# Patient Record
Sex: Female | Born: 1955
Health system: Southern US, Community
[De-identification: ages and names within clinical notes are randomized; demographics above are authoritative.]

## PROBLEM LIST (undated history)

## (undated) DIAGNOSIS — K5792 Diverticulitis of intestine, part unspecified, without perforation or abscess without bleeding: Secondary | ICD-10-CM

## (undated) DIAGNOSIS — T7840XA Allergy, unspecified, initial encounter: Secondary | ICD-10-CM

## (undated) DIAGNOSIS — I429 Cardiomyopathy, unspecified: Secondary | ICD-10-CM

## (undated) DIAGNOSIS — R0600 Dyspnea, unspecified: Secondary | ICD-10-CM

## (undated) DIAGNOSIS — I509 Heart failure, unspecified: Secondary | ICD-10-CM

## (undated) DIAGNOSIS — F329 Major depressive disorder, single episode, unspecified: Secondary | ICD-10-CM

## (undated) DIAGNOSIS — Z5189 Encounter for other specified aftercare: Secondary | ICD-10-CM

## (undated) DIAGNOSIS — F32A Depression, unspecified: Secondary | ICD-10-CM

## (undated) DIAGNOSIS — D649 Anemia, unspecified: Secondary | ICD-10-CM

## (undated) DIAGNOSIS — E119 Type 2 diabetes mellitus without complications: Secondary | ICD-10-CM

## (undated) DIAGNOSIS — F419 Anxiety disorder, unspecified: Secondary | ICD-10-CM

## (undated) DIAGNOSIS — I10 Essential (primary) hypertension: Secondary | ICD-10-CM

## (undated) DIAGNOSIS — E669 Obesity, unspecified: Secondary | ICD-10-CM

## (undated) DIAGNOSIS — N184 Chronic kidney disease, stage 4 (severe): Secondary | ICD-10-CM

## (undated) HISTORY — DX: Anemia, unspecified: D64.9

## (undated) HISTORY — DX: Allergy, unspecified, initial encounter: T78.40XA

## (undated) HISTORY — DX: Major depressive disorder, single episode, unspecified: F32.9

## (undated) HISTORY — DX: Anxiety disorder, unspecified: F41.9

## (undated) HISTORY — PX: REDUCTION MAMMAPLASTY: SUR839

## (undated) HISTORY — PX: LIPOMA EXCISION: SHX5283

## (undated) HISTORY — PX: ABDOMINAL HYSTERECTOMY: SHX81

## (undated) HISTORY — DX: Encounter for other specified aftercare: Z51.89

## (undated) HISTORY — DX: Type 2 diabetes mellitus without complications: E11.9

## (undated) HISTORY — DX: Depression, unspecified: F32.A

## (undated) HISTORY — DX: Cardiomyopathy, unspecified: I42.9

## (undated) HISTORY — DX: Heart failure, unspecified: I50.9

## (undated) HISTORY — DX: Essential (primary) hypertension: I10

## (undated) HISTORY — DX: Obesity, unspecified: E66.9

## (undated) HISTORY — DX: Chronic kidney disease, stage 4 (severe): N18.4

## (undated) HISTORY — PX: BREAST SURGERY: SHX581

---

## 1998-03-25 ENCOUNTER — Emergency Department (HOSPITAL_COMMUNITY): Admission: EM | Admit: 1998-03-25 | Discharge: 1998-03-25 | Payer: Self-pay | Admitting: Emergency Medicine

## 2000-09-14 ENCOUNTER — Other Ambulatory Visit: Admission: RE | Admit: 2000-09-14 | Discharge: 2000-09-14 | Payer: Self-pay | Admitting: Family Medicine

## 2001-03-24 ENCOUNTER — Encounter: Payer: Self-pay | Admitting: Family Medicine

## 2001-03-24 ENCOUNTER — Ambulatory Visit (HOSPITAL_COMMUNITY): Admission: RE | Admit: 2001-03-24 | Discharge: 2001-03-24 | Payer: Self-pay | Admitting: Family Medicine

## 2001-03-27 ENCOUNTER — Encounter: Payer: Self-pay | Admitting: Emergency Medicine

## 2001-03-27 ENCOUNTER — Emergency Department (HOSPITAL_COMMUNITY): Admission: EM | Admit: 2001-03-27 | Discharge: 2001-03-27 | Payer: Self-pay | Admitting: Emergency Medicine

## 2001-11-21 ENCOUNTER — Other Ambulatory Visit: Admission: RE | Admit: 2001-11-21 | Discharge: 2001-11-21 | Payer: Self-pay | Admitting: Obstetrics and Gynecology

## 2002-03-14 ENCOUNTER — Emergency Department (HOSPITAL_COMMUNITY): Admission: EM | Admit: 2002-03-14 | Discharge: 2002-03-14 | Payer: Self-pay | Admitting: Emergency Medicine

## 2002-05-16 ENCOUNTER — Emergency Department (HOSPITAL_COMMUNITY): Admission: EM | Admit: 2002-05-16 | Discharge: 2002-05-16 | Payer: Self-pay | Admitting: Emergency Medicine

## 2003-09-13 ENCOUNTER — Emergency Department (HOSPITAL_COMMUNITY): Admission: EM | Admit: 2003-09-13 | Discharge: 2003-09-13 | Payer: Self-pay | Admitting: Emergency Medicine

## 2003-11-20 ENCOUNTER — Emergency Department (HOSPITAL_COMMUNITY): Admission: EM | Admit: 2003-11-20 | Discharge: 2003-11-21 | Payer: Self-pay | Admitting: Emergency Medicine

## 2004-01-29 ENCOUNTER — Other Ambulatory Visit: Admission: RE | Admit: 2004-01-29 | Discharge: 2004-01-29 | Payer: Self-pay | Admitting: Obstetrics and Gynecology

## 2004-03-12 ENCOUNTER — Encounter: Admission: RE | Admit: 2004-03-12 | Discharge: 2004-03-12 | Payer: Self-pay | Admitting: Obstetrics and Gynecology

## 2004-04-14 ENCOUNTER — Emergency Department (HOSPITAL_COMMUNITY): Admission: EM | Admit: 2004-04-14 | Discharge: 2004-04-14 | Payer: Self-pay | Admitting: Emergency Medicine

## 2005-09-10 ENCOUNTER — Emergency Department (HOSPITAL_COMMUNITY): Admission: EM | Admit: 2005-09-10 | Discharge: 2005-09-10 | Payer: Self-pay | Admitting: Emergency Medicine

## 2005-11-03 ENCOUNTER — Ambulatory Visit (HOSPITAL_COMMUNITY): Admission: RE | Admit: 2005-11-03 | Discharge: 2005-11-03 | Payer: Self-pay | Admitting: Family Medicine

## 2006-03-10 ENCOUNTER — Emergency Department (HOSPITAL_COMMUNITY): Admission: EM | Admit: 2006-03-10 | Discharge: 2006-03-10 | Payer: Self-pay | Admitting: Emergency Medicine

## 2006-03-14 ENCOUNTER — Emergency Department (HOSPITAL_COMMUNITY): Admission: EM | Admit: 2006-03-14 | Discharge: 2006-03-14 | Payer: Self-pay | Admitting: Emergency Medicine

## 2006-03-28 ENCOUNTER — Emergency Department (HOSPITAL_COMMUNITY): Admission: EM | Admit: 2006-03-28 | Discharge: 2006-03-28 | Payer: Self-pay | Admitting: Ophthalmology

## 2006-07-19 ENCOUNTER — Emergency Department (HOSPITAL_COMMUNITY): Admission: EM | Admit: 2006-07-19 | Discharge: 2006-07-19 | Payer: Self-pay | Admitting: Emergency Medicine

## 2006-10-17 ENCOUNTER — Emergency Department (HOSPITAL_COMMUNITY): Admission: EM | Admit: 2006-10-17 | Discharge: 2006-10-17 | Payer: Self-pay | Admitting: Emergency Medicine

## 2007-05-20 ENCOUNTER — Encounter: Admission: RE | Admit: 2007-05-20 | Discharge: 2007-05-20 | Payer: Self-pay | Admitting: Obstetrics and Gynecology

## 2007-07-20 ENCOUNTER — Emergency Department (HOSPITAL_COMMUNITY): Admission: EM | Admit: 2007-07-20 | Discharge: 2007-07-20 | Payer: Self-pay | Admitting: Emergency Medicine

## 2008-03-28 ENCOUNTER — Observation Stay (HOSPITAL_COMMUNITY): Admission: EM | Admit: 2008-03-28 | Discharge: 2008-03-29 | Payer: Self-pay | Admitting: Emergency Medicine

## 2008-03-28 ENCOUNTER — Ambulatory Visit: Payer: Self-pay | Admitting: Cardiology

## 2008-04-04 ENCOUNTER — Ambulatory Visit: Payer: Self-pay

## 2008-04-30 ENCOUNTER — Ambulatory Visit: Payer: Self-pay | Admitting: Internal Medicine

## 2008-05-04 ENCOUNTER — Ambulatory Visit: Payer: Self-pay | Admitting: Internal Medicine

## 2008-05-04 LAB — CONVERTED CEMR LAB
Hgb A1c MFr Bld: 11.6 % — ABNORMAL HIGH (ref 4.6–6.0)
T3, Free: 3 pg/mL (ref 2.3–4.2)
TSH: 0.32 microintl units/mL — ABNORMAL LOW (ref 0.35–5.50)

## 2008-11-29 ENCOUNTER — Encounter (INDEPENDENT_AMBULATORY_CARE_PROVIDER_SITE_OTHER): Payer: Self-pay | Admitting: *Deleted

## 2009-07-23 ENCOUNTER — Ambulatory Visit (HOSPITAL_COMMUNITY): Admission: RE | Admit: 2009-07-23 | Discharge: 2009-07-23 | Payer: Self-pay | Admitting: Family Medicine

## 2010-01-29 ENCOUNTER — Telehealth: Payer: Self-pay | Admitting: Internal Medicine

## 2010-11-02 ENCOUNTER — Encounter: Payer: Self-pay | Admitting: Obstetrics and Gynecology

## 2010-11-02 ENCOUNTER — Encounter: Payer: Self-pay | Admitting: Geriatric Medicine

## 2010-11-13 NOTE — Progress Notes (Signed)
Summary: Schedule Colonoscopy  Phone Note Outgoing Call Call back at Home Phone [phone]   Call placed by: Bernita Buffy CMA Deborra Medina),  January 29, 2010 4:48 PM Call placed to: Patient Summary of Call: All numbers listed for the patient are disconnected. She is due for a recall colonoscopy, we will mail her another letter Initial call taken by: Bernita Buffy CMA Deborra Medina),  January 29, 2010 4:49 PM
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
## 2011-02-24 NOTE — Discharge Summary (Signed)
[name], [name]                  ACCOUNT NO.:  [id number]   MEDICAL RECORD NO.:  [id number]          PATIENT TYPE:  OBV   LOCATION:  3701                         FACILITY:  [facility]   PHYSICIAN:  Fay Records, MD, FACCDATE OF BIRTH:  11-25-1955   DATE OF ADMISSION:  03/28/2008  DATE OF DISCHARGE:  03/29/2008                               DISCHARGE SUMMARY   PRIMARY CARDIOLOGIST:  Vanna Scotland. Olevia Perches, MD, [facility]   PRIMARY CARE PHYSICIAN:  Dr. Reinaldo Meeker at [facility] in  [place].   DISCHARGE DIAGNOSIS:  Chest pain without EKG changes with negative  cardiac enzymes this admission.  The patient being discharged home to  have outpatient stress Myoview, which is scheduled for April 04, 2008, at  8 a.m.   PAST MEDICAL HISTORY:  1. Diabetes.  The patient's hemoglobin A1c during this admission is      13.6.  2. Dyslipidemia.  Lipid panel drawn on this admission, total      cholesterol 206, triglycerides 465, and HDL 37.  3. Status post C-section as well as breast reduction, and      hysterectomy.   ALLERGIES:  SULFA.   HOSPITAL COURSE:  Patricia [name] is a 55 year old female admitted for episode  of chest discomfort, felt to be atypical, however, multiple risk  factors.  The patient with known history of diabetes, poorly controlled.  States she has been under increased stress, complaining of increased  anxiety, evaluated by Dr. Jabier Mutton.  The patient being discharged home after  ruled out for MI to follow up with primary care physician regarding her  diabetes and follow up in the office for stress Myoview, which is  scheduled for April 04, 2008, and then a followup with Dr. Harrington Challenger on April 30, 2008, at 4 p.m.  The patient to continue her home medications with  the addition of aspirin 81 mg p.o. daily.   HOME MEDICATIONS:  1. Metformin 1000 mg b.i.d.  2. Insulin 70/30, 55 units in the a.m., 35 units at lunch, and 45      units in the evening.  3. Lopid 1 tablet daily.  4. Accupril  5 mg daily.   DIET:  The patient is also instructed to follow heart-healthy, diabetic  diet.   DURATION OF DISCHARGE/ENCOUNTER:  Less than 30 minutes.      Rosanne Sack, ACNP      Fay Records, MD, [facility]  Electronically Signed    MB/MEDQ  D:  03/29/2008  T:  03/29/2008  Job:  [id number]   cc:   Dr. Reinaldo Meeker
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
## 2011-02-27 NOTE — H&P (Signed)
Patricia [name], Patricia NO.:  [id number]   MEDICAL RECORD NO.:  [id number]          Patricia [name] TYPE:  EMS   LOCATION:  MAJO                         FACILITY:  [facility]   PHYSICIAN:  Lizabeth Leyden, MD DATE OF BIRTH:  [birth year]   DATE OF ADMISSION:  03/28/2006  DATE OF DISCHARGE:                                HISTORY & PHYSICAL   Cardiology History and Physical on behalf of Dr. Haroldine Laws   CHIEF COMPLAINT:  Chest pain.   HISTORY OF PRESENT ILLNESS:  He is a 55 year old gentleman with a history of  diabetes times 10 years and alcohol abuse who presents to the emergency room  complaining of chest pain 10/10, occurring after eating a sandwich.  No  radiation.  No shortness of breath.  No syncope or ordinary syncope.  The  pain has been refractory to sublingual nitroglycerin, nitroglycerin drip and  was only appreciable to tolerate and was only improved with morphine.   PAST MEDICAL HISTORY:  1.  Diabetes for 5-10 years.  2.  Former alcohol abuser.  3.  Formerly incarcerated.   SOCIAL HISTORY:  [place]ives locally.  Greater than 40-pack year smoking history.  Former alcohol abuser.  Denies drugs.  Mother and father both died of  complications of coronary artery disease in their 62's.   ALLERGIES:  No known drug allergies.   MEDICATIONS:  1.  Glipizide.  2.  Fentanyl patch.  3.  Zantac.  4.  Cortisone.   REVIEW OF SYSTEMS:  Otherwise, negative besides what is stated in HPI.   PHYSICAL EXAMINATION:  VITAL SIGNS:  Temperature 98.7, pulse 90,  respirations 18, blood pressure 110/70 in the right arm and in the left arm  108/70, saturations 98% on 2 liters.  GENERAL APPEARANCE:  No apparent distress, sleeping, arouses with pain.  HEENT:  Normocephalic, atraumatic.  Pupils equal, round and reactive to  light and accommodation.  Extraocular muscles intact.  NECK:  Supple.  Generally, he has pulsations that are absent with the head  at 30 degrees.  Lymphadenopathy is  unremarkable.  CARDIOVASCULAR:  Regular S1, S2 with no murmurs, rubs or gallops.  A lot of  deviated PMI.  Pulses are 2+ and equal throughout.  LUNGS:  Clear to auscultation with no wheezes, rales or rhonchi.  SKIN:  Clear.  ABDOMEN:  Soft, nontender with positive bowel sounds.  RECTAL:  Heme negative stools.  EXTREMITIES:  No cyanosis, clubbing or edema.  No CVA tenderness.  NEUROLOGICAL:  Alert and oriented x3.  Cranial nerves 2-12 are intact.  Strength is 5/5 in all extremities.  Normal sensation throughout.  Normal  cerebellar function.   STUDIES:  EKG shows a heart rate of 107, sinus rhythm, normal axis.  PR  intervals 150, QRS duration 79, QTC 439.  Labs are currently pending.   ASSESSMENT/PLAN:  This is a 55 year old man with chest pain that is  atypical, and he describes in intensity which is as worse as apparent  clinical exam findings.  Other possibilities for his chest pain in addition  to cardiac syndrome includes GERD and  possibly dissection.  We will get a CT  scan to address the possibility of dissection, although, clinical suspicion  is low.  We will start the Patricia [name] on GI cocktail.  We will rule out MI with  enzymes and continued telemetry.  Will check a lipid panel, check a  hemoglobin A1C, start ACE inhibitor and beta blocker and consider a  functional study if the above is negative.      Lizabeth Leyden, MD     DBH/MEDQ  D:  03/28/2006  T:  03/29/2006  Job:  [id number]

## 2011-07-09 LAB — POCT I-STAT, CHEM 8
BUN: 11
Calcium, Ion: 1.17
Chloride: 101
Creatinine, Ser: 0.5
Glucose, Bld: 316 — ABNORMAL HIGH
HCT: 41
Hemoglobin: 13.9
Potassium: 4
Sodium: 136
TCO2: 26

## 2011-07-09 LAB — PROTIME-INR
INR: 0.9
Prothrombin Time: 11.9

## 2011-07-09 LAB — TSH: TSH: 0.303 — ABNORMAL LOW

## 2011-07-09 LAB — LIPID PANEL
Cholesterol: 206 — ABNORMAL HIGH
HDL: 37 — ABNORMAL LOW
LDL Cholesterol: UNDETERMINED
Total CHOL/HDL Ratio: 5.6
Triglycerides: 465 — ABNORMAL HIGH
VLDL: UNDETERMINED

## 2011-07-09 LAB — COMPREHENSIVE METABOLIC PANEL
ALT: 19
AST: 24
Albumin: 3 — ABNORMAL LOW
Alkaline Phosphatase: 87
BUN: 10
CO2: 26
Calcium: 8.8
Chloride: 103
Creatinine, Ser: 0.57
GFR calc Af Amer: >60
GFR calc non Af Amer: >60
Glucose, Bld: 325 — ABNORMAL HIGH
Potassium: 4
Sodium: 136
Total Bilirubin: 0.8
Total Protein: 6.1

## 2011-07-09 LAB — DIFFERENTIAL
Basophils Absolute: 0.1
Lymphocytes Relative: 46
Monocytes Relative: 4
Neutrophils Relative %: 48

## 2011-07-09 LAB — CBC
HCT: 38.5
Platelets: 204
RBC: 4.35
WBC: 7.8

## 2011-07-09 LAB — CARDIAC PANEL(CRET KIN+CKTOT+MB+TROPI)
CK, MB: 0.7
Relative Index: INVALID
Total CK: 82
Troponin I: 0.02
Troponin I: <0.01

## 2011-07-09 LAB — APTT: aPTT: 25

## 2011-07-23 LAB — CBC
MCHC: 33.8
Platelets: 212
RDW: 13.2

## 2011-07-23 LAB — DIFFERENTIAL
Eosinophils Absolute: 0
Eosinophils Relative: 0
Lymphocytes Relative: 35
Monocytes Absolute: 0.4
Monocytes Relative: 5

## 2011-07-23 LAB — I-STAT 8, (EC8 V) (CONVERTED LAB)
BUN: 6
Chloride: 102
HCT: 42
Operator id: 294521
Potassium: 4
Sodium: 133 — ABNORMAL LOW
pH, Ven: 7.541 — ABNORMAL HIGH

## 2011-07-23 LAB — URINE CULTURE

## 2011-07-23 LAB — URINALYSIS, ROUTINE W REFLEX MICROSCOPIC
Glucose, UA: >1000 — AB
Hgb urine dipstick: NEGATIVE
Ketones, ur: NEGATIVE
Nitrite: NEGATIVE
Protein, ur: NEGATIVE
Specific Gravity, Urine: 1.031 — ABNORMAL HIGH
pH: 6

## 2011-07-23 LAB — POCT I-STAT CREATININE: Operator id: 294521

## 2011-07-23 LAB — URINE MICROSCOPIC-ADD ON

## 2013-02-08 ENCOUNTER — Encounter (HOSPITAL_COMMUNITY): Payer: Self-pay

## 2013-02-08 ENCOUNTER — Emergency Department (INDEPENDENT_AMBULATORY_CARE_PROVIDER_SITE_OTHER)
Admission: EM | Admit: 2013-02-08 | Discharge: 2013-02-08 | Disposition: A | Discharge status: Home / Self Care | Payer: No Typology Code available for payment source | Source: Home / Self Care

## 2013-02-08 DIAGNOSIS — E119 Type 2 diabetes mellitus without complications: Secondary | ICD-10-CM

## 2013-02-08 LAB — BASIC METABOLIC PANEL
BUN: 14 mg/dL (ref 6–23)
Calcium: 9.4 mg/dL (ref 8.4–10.5)
Creatinine, Ser: 0.75 mg/dL (ref 0.50–1.10)
GFR calc non Af Amer: >90 mL/min (ref >90)
Glucose, Bld: 128 mg/dL — ABNORMAL HIGH (ref 70–99)
Sodium: 141 mEq/L (ref 135–145)

## 2013-02-08 MED ORDER — LISINOPRIL-HYDROCHLOROTHIAZIDE 10-12.5 MG PO TABS: 1.0000 | ORAL_TABLET | Freq: Every day | ORAL | Status: DC

## 2013-02-08 MED ORDER — GLIPIZIDE 10 MG PO TABS: 10.0000 mg | ORAL_TABLET | Freq: Two times a day (BID) | ORAL | Status: DC

## 2013-02-08 MED ORDER — INSULIN GLARGINE 100 UNIT/ML SOLOSTAR PEN: 45.0000 [IU] | PEN_INJECTOR | Freq: Two times a day (BID) | SUBCUTANEOUS | Status: DC

## 2013-02-08 MED ORDER — METFORMIN HCL 1000 MG PO TABS: 1000.0000 mg | ORAL_TABLET | Freq: Two times a day (BID) | ORAL | Status: DC

## 2013-02-08 NOTE — ED Notes (Signed)
Patient has a history of DM Needs medication refill

## 2013-02-24 ENCOUNTER — Telehealth: Payer: Self-pay | Admitting: General Practice

## 2013-02-24 NOTE — Telephone Encounter (Signed)
Patient called to get bloodwork results

## 2013-02-27 ENCOUNTER — Telehealth: Payer: Self-pay | Admitting: General Practice

## 2013-02-27 NOTE — Telephone Encounter (Signed)
Pt wants lab results for 4/30 visit. Please call back.

## 2013-02-28 ENCOUNTER — Telehealth: Payer: Self-pay | Admitting: *Deleted

## 2013-02-28 NOTE — Telephone Encounter (Signed)
02/28/13 Patient made aware of lab results drawn ob 02/08/13

## 2013-03-10 ENCOUNTER — Ambulatory Visit: Payer: No Typology Code available for payment source | Attending: Family Medicine | Admitting: Internal Medicine

## 2013-03-10 VITALS — BP 164/94 | HR 97 | Temp 98.4°F | Resp 18 | Ht 63.0 in | Wt 188.0 lb

## 2013-03-10 DIAGNOSIS — Z0000 Encounter for general adult medical examination without abnormal findings: Secondary | ICD-10-CM | POA: Insufficient documentation

## 2013-03-10 DIAGNOSIS — I10 Essential (primary) hypertension: Secondary | ICD-10-CM | POA: Insufficient documentation

## 2013-03-10 DIAGNOSIS — Z79899 Other long term (current) drug therapy: Secondary | ICD-10-CM | POA: Insufficient documentation

## 2013-03-10 DIAGNOSIS — Z794 Long term (current) use of insulin: Secondary | ICD-10-CM | POA: Insufficient documentation

## 2013-03-10 DIAGNOSIS — E119 Type 2 diabetes mellitus without complications: Secondary | ICD-10-CM | POA: Insufficient documentation

## 2013-03-10 MED ORDER — LISINOPRIL-HYDROCHLOROTHIAZIDE 10-12.5 MG PO TABS: 2.0000 | ORAL_TABLET | Freq: Every day | ORAL | Status: DC

## 2013-03-10 NOTE — Progress Notes (Signed)
Patient ID: AMAHIA KUMAGAI, female   DOB: 1956/06/28, 57 y.o.   MRN: [id number] Patient Demographics  Patricia [name], is a 57 y.o. female  [id number]  [id number]  DOB - [birth year]  Chief Complaint  Patient presents with  . Follow-up    Patient presents to review medicaiton and labs.        Subjective:   Patricia [name] today is here to establish primary care.Patient has a longstanding hx of HTN and DM. She does not have any complaints at present. Patient has a History reviewed. No pertinent past medical history.. Currently patient has no complaints. Patient has also has No headache, No chest pain, No abdominal pain,No Nausea, No new weakness tingling or numbness, No Cough or SOB  Objective:    Filed Vitals:   03/10/13 0908  BP: 164/94  Pulse: 97  Temp: 98.4 F (36.9 C)  TempSrc: Oral  Resp: 18  Height: 5\' 3"  (1.6 m)  Weight: 188 lb (85.276 kg)  SpO2: 98%     ALLERGIES:   Allergies  Allergen Reactions  . Sulfa Antibiotics     PAST MEDICAL HISTORY: Hypertension Diabetes  PAST SURGICAL HISTORY: Hysterectomy Cesarean section Mammoplasty  FAMILY HISTORY: No family history of CAD  MEDICATIONS AT HOME: Prior to Admission medications   Medication Sig Start Date End Date Taking? Authorizing Provider  glipiZIDE (GLUCOTROL) 10 MG tablet Take 1 tablet (10 mg total) by mouth 2 (two) times daily before a meal. 02/08/13  Yes Acquanetta Chain, DO  Insulin Glargine (LANTUS SOLOSTAR) 100 UNIT/ML SOPN Inject 45-50 Units into the skin 2 (two) times daily. 02/08/13  Yes Acquanetta Chain, DO  lisinopril-hydrochlorothiazide (PRINZIDE,ZESTORETIC) 10-12.5 MG per tablet Take 2 tablets by mouth daily. 03/10/13  Yes Shanker Kristeen Mans, MD  metFORMIN (GLUCOPHAGE) 1000 MG tablet Take 1 tablet (1,000 mg total) by mouth 2 (two) times daily. 02/08/13  Yes Acquanetta Chain, DO    SOCIAL HISTORY:   has no tobacco, alcohol, and drug history on file.  REVIEW OF SYSTEMS:   Constitutional:   No   Fevers, chills, fatigue.  HEENT:    No headaches, Sore throat,   Cardio-vascular: No chest pain,  Orthopnea, swelling in lower extremities, anasarca, palpitations  GI:  No abdominal pain, nausea, vomiting, diarrhea  Resp: No shortness of breath,  No coughing up of blood.No cough.No wheezing.  Skin:  no rash or lesions.  GU:  no dysuria, change in color of urine, no urgency or frequency.  No flank pain.  Musculoskeletal: No joint pain or swelling.  No decreased range of motion.  No back pain.  Psych: No change in mood or affect. No depression or anxiety.  No memory loss.   Exam  General appearance :Awake, alert, not in any distress. Speech Clear. Not toxic Looking HEENT: Atraumatic and Normocephalic, pupils equally reactive to light and accomodation Neck: supple, no JVD. No cervical lymphadenopathy.  Chest:Good air entry bilaterally, no added sounds  CVS: S1 S2 regular, no murmurs.  Abdomen: Bowel sounds present, Non tender and not distended with no gaurding, rigidity or rebound. Extremities: B/L Lower Ext shows no edema, both legs are warm to touch Neurology: Awake alert, and oriented X 3, CN II-XII intact, Non focal Skin:No Rash Wounds:N/A    Data Review   CBC No results found for this basename: WBC, HGB, HCT, PLT, MCV, MCH, MCHC, RDW, NEUTRABS, LYMPHSABS, MONOABS, EOSABS, BASOSABS, BANDABS, BANDSABD,  in the last 168 hours  Chemistries   No results found for  this basename: NA, K, CL, CO2, GLUCOSE, BUN, CREATININE, GFRCGP, CALCIUM, MG, AST, ALT, ALKPHOS, BILITOT,  in the last 168 hours ------------------------------------------------------------------------------------------------------------------ No results found for this basename: HGBA1C,  in the last 72 hours ------------------------------------------------------------------------------------------------------------------ No results found for this basename: CHOL, HDL, LDLCALC, TRIG,  CHOLHDL, LDLDIRECT,  in the last 72 hours ------------------------------------------------------------------------------------------------------------------ No results found for this basename: TSH, T4TOTAL, FREET3, T3FREE, THYROIDAB,  in the last 72 hours ------------------------------------------------------------------------------------------------------------------ No results found for this basename: VITAMINB12, FOLATE, FERRITIN, TIBC, IRON, RETICCTPCT,  in the last 72 hours  Coagulation profile  No results found for this basename: INR, PROTIME,  in the last 168 hours    Assessment & Plan   Hypertension - Uncontrolled, increase lisinopril/HCTZ to 20/25 - Followup in one month  Diabetes - Have asked the patient to make followup of her CBGs and bring those readings with her during the next - Continue with Lantus 50 units twice a day, glipizide 10 mg twice a day, and metformin 1000 mg twice a day  Followup in one month

## 2013-04-10 ENCOUNTER — Ambulatory Visit: Payer: No Typology Code available for payment source

## 2013-04-18 ENCOUNTER — Ambulatory Visit: Payer: No Typology Code available for payment source | Attending: Family Medicine | Admitting: Internal Medicine

## 2013-04-18 VITALS — BP 148/86 | HR 97 | Temp 98.2°F | Resp 16

## 2013-04-18 DIAGNOSIS — IMO0001 Reserved for inherently not codable concepts without codable children: Secondary | ICD-10-CM | POA: Insufficient documentation

## 2013-04-18 DIAGNOSIS — I10 Essential (primary) hypertension: Secondary | ICD-10-CM | POA: Insufficient documentation

## 2013-04-18 DIAGNOSIS — E785 Hyperlipidemia, unspecified: Secondary | ICD-10-CM | POA: Insufficient documentation

## 2013-04-18 DIAGNOSIS — E119 Type 2 diabetes mellitus without complications: Secondary | ICD-10-CM

## 2013-04-18 MED ORDER — INSULIN ASPART PROT & ASPART (70-30 MIX) 100 UNIT/ML ~~LOC~~ SUSP: 35.0000 [IU] | Freq: Two times a day (BID) | SUBCUTANEOUS | Status: DC

## 2013-04-18 MED ORDER — AMLODIPINE BESYLATE 5 MG PO TABS: 5.0000 mg | ORAL_TABLET | Freq: Every day | ORAL | Status: DC

## 2013-04-18 NOTE — Progress Notes (Signed)
Pt here for A1C draw and the need for medication financial assistance. States she recently lost job and unable to afford bp/ insulin meds. Last cbg checked yesterday 236. Pt is taking Metformin 1000mg  tab.vss

## 2013-04-18 NOTE — Patient Instructions (Addendum)
Blood Sugar Monitoring, Adult GLUCOSE METERS FOR SELF-MONITORING OF BLOOD GLUCOSE  It is important to be able to correctly measure your blood sugar (glucose). You can use a blood glucose monitor (a small battery-operated device) to check your glucose level at any time. This allows you and your caregiver to monitor your diabetes and to determine how well your treatment plan is working. The process of monitoring your blood glucose with a glucose meter is called self-monitoring of blood glucose (SMBG). When people with diabetes control their blood sugar, they have better health. To test for glucose with a typical glucose meter, place the disposable strip in the meter. Then place a small sample of blood on the "test strip." The test strip is coated with chemicals that combine with glucose in blood. The meter measures how much glucose is present. The meter displays the glucose level as a number. Several new models can record and store a number of test results. Some models can connect to personal computers to store test results or print them out.  Newer meters are often easier to use than older models. Some meters allow you to get blood from places other than your fingertip. Some new models have automatic timing, error codes, signals, or barcode readers to help with proper adjustment (calibration). Some meters have a large display screen or spoken instructions for people with visual impairments.  INSTRUCTIONS FOR USING GLUCOSE METERS  Wash your hands with soap and warm water, or clean the area with alcohol. Dry your hands completely.  Prick the side of your fingertip with a lancet (a sharp-pointed tool used by hand).  Hold the hand down and gently milk the finger until a small drop of blood appears. Catch the blood with the test strip.  Follow the instructions for inserting the test strip and using the SMBG meter. Most meters require the meter to be turned on and the test strip to be inserted before applying  the blood sample.  Record the test result.  Read the instructions carefully for both the meter and the test strips that go with it. Meter instructions are found in the user manual. Keep this manual to help you solve any problems that may arise. Many meters use "error codes" when there is a problem with the meter, the test strip, or the blood sample on the strip. You will need the manual to understand these error codes and fix the problem.  New devices are available such as laser lancets and meters that can test blood taken from "alternative sites" of the body, other than fingertips. However, you should use standard fingertip testing if your glucose changes rapidly. Also, use standard testing if:  You have eaten, exercised, or taken insulin in the past 2 hours.  You think your glucose is low.  You tend to not feel symptoms of low blood glucose (hypoglycemia).  You are ill or under stress.  Clean the meter as directed by the manufacturer.  Test the meter for accuracy as directed by the manufacturer.  Take your meter with you to your caregiver's office. This way, you can test your glucose in front of your caregiver to make sure you are using the meter correctly. Your caregiver can also take a sample of blood to test using a routine lab method. If values on the glucose meter are close to the lab results, you and your caregiver will see that your meter is working well and you are using good technique. Your caregiver will advise you about what  to do if the results do not match. FREQUENCY OF TESTING  Your caregiver will tell you how often you should check your blood glucose. This will depend on your type of diabetes, your current level of diabetes control, and your types of medicines. The following are general guidelines, but your care plan may be different. Record all your readings and the time of day you took them for review with your caregiver.   Diabetes type 1.  When you are using insulin  with good diabetic control (either multiple daily injections or via a pump), you should check your glucose 4 times a day.  If your diabetes is not well controlled, you may need to monitor more frequently, including before meals and 2 hours after meals, at bedtime, and occasionally between 2 a.m. and 3 a.m.  You should always check your glucose before a dose of insulin or before changing the rate on your insulin pump.  Diabetes type 2.  Guidelines for SMBG in diabetes type 2 are not as well defined.  If you are on insulin, follow the guidelines above.  If you are on medicines, but not insulin, and your glucose is not well controlled, you should test at least twice daily.  If you are not on insulin, and your diabetes is controlled with medicines or diet alone, you should test at least once daily, usually before breakfast.  A weekly profile will help your caregiver advise you on your care plan. The week before your visit, check your glucose before a meal and 2 hours after a meal at least daily. You may want to test before and after a different meal each day so you and your caregiver can tell how well controlled your blood sugars are throughout the course of a 24 hour period.  Gestational diabetes (diabetes during pregnancy).  Frequent testing is often necessary. Accurate timing is important.  If you are not on insulin, check your glucose 4 times a day. Check it before breakfast and 1 hour after the start of each meal.  If you are on insulin, check your glucose 6 times a day. Check it before each meal and 1 hour after the first bite of each meal.  General guidelines.  More frequent testing is required at the start of insulin treatment. Your caregiver will instruct you.  Test your glucose any time you suspect you have low blood sugar (hypoglycemia).  You should test more often when you change medicines, when you have unusual stress or illness, or in other unusual circumstances. OTHER  THINGS TO KNOW ABOUT GLUCOSE METERS  Measurement Range. Most glucose meters are able to read glucose levels over a broad range of values from as low as 0 to as high as 600 mg/dL. If you get an extremely high or low reading from your meter, you should first confirm it with another reading. Report very high or very low readings to your caregiver.  Whole Blood Glucose versus Plasma Glucose. Some older home glucose meters measure glucose in your whole blood. In a lab or when using some newer home glucose meters, the glucose is measured in your plasma (one component of blood). The difference can be important. It is important for you and your caregiver to know whether your meter gives its results as "whole blood equivalent" or "plasma equivalent."  Display of High and Low Glucose Values. Part of learning how to operate a meter is understanding what the meter results mean. Know how high and low glucose concentrations are displayed  on your meter.  Factors that Affect Glucose Meter Performance. The accuracy of your test results depends on many factors and varies depending on the brand and type of meter. These factors include:  Low red blood cell count (anemia).  Substances in your blood (such as uric acid, vitamin C, and others).  Environmental factors (temperature, humidity, altitude).  Name-brand versus generic test strips.  Calibration. Make sure your meter is set up properly. It is a good idea to do a calibration test with a control solution recommended by the manufacturer of your meter whenever you begin using a fresh bottle of test strips. This will help verify the accuracy of your meter.  Improperly stored, expired, or defective test strips. Keep your strips in a dry place with the lid on.  Soiled meter.  Inadequate blood sample. NEW TECHNOLOGIES FOR GLUCOSE TESTING Alternative site testing Some glucose meters allow testing blood from alternative sites. These include the:  Upper  arm.  Forearm.  Base of the thumb.  Thigh. Sampling blood from alternative sites may be desirable. However, it may have some limitations. Blood in the fingertips show changes in glucose levels more quickly than blood in other parts of the body. This means that alternative site test results may be different from fingertip test results, not because of the meter's ability to test accurately, but because the actual glucose concentration can be different.  Continuous Glucose Monitoring Devices to measure your blood glucose continuously are available, and others are in development. These methods can be more expensive than self-monitoring with a glucose meter. However, it is uncertain how effective and reliable these devices are. Your caregiver will advise you if this approach makes sense for you. IF BLOOD SUGARS ARE CONTROLLED, PEOPLE WITH DIABETES REMAIN HEALTHIER.  SMBG is an important part of the treatment plan of patients with diabetes mellitus. Below are reasons for using SMBG:   It confirms that your glucose is at a specific, healthy level.  It detects hypoglycemia and severe hyperglycemia.  It allows you and your caregiver to make adjustments in response to changes in lifestyle for individuals requiring medicine.  It determines the need for starting insulin therapy in temporary diabetes that happens during pregnancy (gestational diabetes). Document Released: 10/01/2003 Document Revised: 12/21/2011 Document Reviewed: 01/22/2011 [facility] Patient Information 2014 [place]. Hypertension As your heart beats, it forces blood through your arteries. This force is your blood pressure. If the pressure is too high, it is called hypertension (HTN) or high blood pressure. HTN is dangerous because you may have it and not know it. High blood pressure may mean that your heart has to work harder to pump blood. Your arteries may be narrow or stiff. The extra work puts you at risk for heart disease,  stroke, and other problems.  Blood pressure consists of two numbers, a higher number over a lower, 110/72, for example. It is stated as "110 over 72." The ideal is below 120 for the top number (systolic) and under 80 for the bottom (diastolic). Write down your blood pressure today. You should pay close attention to your blood pressure if you have certain conditions such as:  Heart failure.  Prior heart attack.  Diabetes  Chronic kidney disease.  Prior stroke.  Multiple risk factors for heart disease. To see if you have HTN, your blood pressure should be measured while you are seated with your arm held at the level of the heart. It should be measured at least twice. A one-time elevated blood pressure  reading (especially in the Emergency Department) does not mean that you need treatment. There may be conditions in which the blood pressure is different between your right and left arms. It is important to see your caregiver soon for a recheck. Most people have essential hypertension which means that there is not a specific cause. This type of high blood pressure may be lowered by changing lifestyle factors such as:  Stress.  Smoking.  Lack of exercise.  Excessive weight.  Drug/tobacco/alcohol use.  Eating less salt. Most people do not have symptoms from high blood pressure until it has caused damage to the body. Effective treatment can often prevent, delay or reduce that damage. TREATMENT  When a cause has been identified, treatment for high blood pressure is directed at the cause. There are a large number of medications to treat HTN. These fall into several categories, and your caregiver will help you select the medicines that are best for you. Medications may have side effects. You should review side effects with your caregiver. If your blood pressure stays high after you have made lifestyle changes or started on medicines,   Your medication(s) may need to be changed.  Other  problems may need to be addressed.  Be certain you understand your prescriptions, and know how and when to take your medicine.  Be sure to follow up with your caregiver within the time frame advised (usually within two weeks) to have your blood pressure rechecked and to review your medications.  If you are taking more than one medicine to lower your blood pressure, make sure you know how and at what times they should be taken. Taking two medicines at the same time can result in blood pressure that is too low. SEEK IMMEDIATE MEDICAL CARE IF:  You develop a severe headache, blurred or changing vision, or confusion.  You have unusual weakness or numbness, or a faint feeling.  You have severe chest or abdominal pain, vomiting, or breathing problems. MAKE SURE YOU:   Understand these instructions.  Will watch your condition.  Will get help right away if you are not doing well or get worse. Document Released: 09/28/2005 Document Revised: 12/21/2011 Document Reviewed: 05/18/2008 [facility] Patient Information 2014 [place].

## 2013-04-18 NOTE — Progress Notes (Signed)
Patient ID: Patricia [name], female   DOB: 03-Aug-1956, 57 y.o.   MRN: [id number]  [id number] up for BP medications  HPI: 57 year old female with past medical history of hypertension, diabetes and dyslipidemia who presented to clinic for followup. Patient reported she does not have money to afford medications except for metformin and glipizide but she cannot afford lisinopril or Lantus. She admits to being noncompliant with those last 2 medications. She has no reports of blurry vision or headaches. No chest pain, shortness of breath or lightheadedness or palpitations.  Allergies  Allergen Reactions  . Sulfa Antibiotics    No past medical history on file. Current Outpatient Prescriptions on File Prior to Visit  Medication Sig Dispense Refill  . glipiZIDE (GLUCOTROL) 10 MG tablet Take 1 tablet (10 mg total) by mouth 2 (two) times daily before a meal.  60 tablet  6  . Insulin Glargine (LANTUS SOLOSTAR) 100 UNIT/ML SOPN Inject 45-50 Units into the skin 2 (two) times daily.  5 pen  11  . lisinopril-hydrochlorothiazide (PRINZIDE,ZESTORETIC) 10-12.5 MG per tablet Take 2 tablets by mouth daily.  60 tablet  3  . metFORMIN (GLUCOPHAGE) 1000 MG tablet Take 1 tablet (1,000 mg total) by mouth 2 (two) times daily.  60 tablet  12  . [DISCONTINUED] quinapril (ACCUPRIL) 5 MG tablet Take by mouth at bedtime.       No current facility-administered medications on file prior to visit.   Family medical history significant for HTN, HLD  History   Social History  . Marital Status: Divorced    Spouse Name: N/A    Number of Children: N/A  . Years of Education: N/A   Occupational History  . Not on file.   Social History Main Topics  . Smoking status: Not on file  . Smokeless tobacco: Not on file  . Alcohol Use: Not on file  . Drug Use: Not on file  . Sexually Active: Not on file   Other Topics Concern  . Not on file   Social History Narrative  . No narrative on file    Review of Systems   Constitutional: Negative for fever, chills, diaphoresis, activity change, appetite change and fatigue.  HENT: Negative for ear pain, nosebleeds, congestion, facial swelling, rhinorrhea, neck pain, neck stiffness and ear discharge.   Eyes: Negative for pain, discharge, redness, itching and visual disturbance.  Respiratory: Negative for cough, choking, chest tightness, shortness of breath, wheezing and stridor.   Cardiovascular: Negative for chest pain, palpitations and leg swelling.  Gastrointestinal: Negative for abdominal distention.  Genitourinary: Negative for dysuria, urgency, frequency, hematuria, flank pain, decreased urine volume, difficulty urinating and dyspareunia.  Musculoskeletal: Negative for back pain, joint swelling, arthralgias and gait problem.  Neurological: Negative for dizziness, tremors, seizures, syncope, facial asymmetry, speech difficulty, weakness, light-headedness, numbness and headaches.  Hematological: Negative for adenopathy. Does not bruise/bleed easily.  Psychiatric/Behavioral: Negative for hallucinations, behavioral problems, confusion, dysphoric mood, decreased concentration and agitation.    Objective:   Filed Vitals:   04/18/13 0941  BP: 148/86  Pulse: 97  Temp: 98.2 F (36.8 C)  Resp: 16    Physical Exam  Constitutional: Appears well-developed and well-nourished. No distress.  HENT: Normocephalic. External right and left ear normal. Oropharynx is clear and moist.  Eyes: Conjunctivae and EOM are normal. PERRLA, no scleral icterus.  Neck: Normal ROM. Neck supple. No JVD. No tracheal deviation. No thyromegaly.  CVS: RRR, S1/S2 +, no murmurs, no gallops, no carotid bruit.  Pulmonary: Effort and breath  sounds normal, no stridor, rhonchi, wheezes, rales.  Abdominal: Soft. BS +,  no distension, tenderness, rebound or guarding.  Musculoskeletal: Normal range of motion. No edema and no tenderness.  Lymphadenopathy: No lymphadenopathy noted, cervical,  inguinal. Neuro: Alert. Normal reflexes, muscle tone coordination. No cranial nerve deficit. Skin: Skin is warm and dry. No rash noted. Not diaphoretic. No erythema. No pallor.  Psychiatric: Normal mood and affect. Behavior, judgment, thought content normal.   Lab Results  Component Value Date   WBC 7.8 03/28/2008   HGB 13.9 03/28/2008   HCT 41.0 03/28/2008   MCV 88.4 03/28/2008   PLT 204 03/28/2008   Lab Results  Component Value Date   CREATININE 0.75 02/08/2013   BUN 14 02/08/2013   NA 141 02/08/2013   K 3.4* 02/08/2013   CL 104 02/08/2013   CO2 30 02/08/2013    Lab Results  Component Value Date   HGBA1C 10.8* 02/08/2013   Lipid Panel     Component Value Date/Time   CHOL  Value: 206        ATP III CLASSIFICATION:  <200     mg/dL   Desirable  200-239  mg/dL   Borderline High  >=240    mg/dL   High* 03/28/2008 1547   TRIG 465* 03/28/2008 1547   HDL 37* 03/28/2008 1547   CHOLHDL 5.6 03/28/2008 1547   VLDL UNABLE TO CALCULATE IF TRIGLYCERIDE OVER 400 mg/dL 03/28/2008 1547   LDLCALC  Value: UNABLE TO CALCULATE IF TRIGLYCERIDE OVER 400 mg/dL        Total Cholesterol/HDL:CHD Risk Coronary Heart Disease Risk Table                     Men   Women  1/2 Average Risk   3.4   3.3 03/28/2008 1547       Assessment and plan:   Patient Active Problem List   Diagnosis Date Noted  . Diabetes, uncontrolled - Last A1c 02/08/2013 is 10.8 indicating poor glycemic control - will check A1c today - patient is noncompliant with Lantus because of financial issues  - Patient is on glipizide and metformin which she reports she is compliant with those medications  03/10/2013     Dyslipidemia  - patient instructed to takefish oil; however due to limited financial resources patient may not be able to afford this   . HTN (hypertension) - As mentioned above due to restrictive financial resources patient was not able to fill lisinopril. We have provided a sample Norvasc/Olmesartan from the clinic 5/20mg   - We have  discussed target BP range - I have advised pt to check BP regularly and to call us back if the numbers are higher than 140/90 - discussed the importance of compliance with medical therapy and diet  03/10/2013     Leisa Lenz [place]

## 2013-04-25 ENCOUNTER — Encounter: Payer: Self-pay | Admitting: Obstetrics & Gynecology

## 2013-05-17 ENCOUNTER — Encounter: Payer: Self-pay | Admitting: Obstetrics & Gynecology

## 2013-05-17 ENCOUNTER — Ambulatory Visit (INDEPENDENT_AMBULATORY_CARE_PROVIDER_SITE_OTHER): Payer: No Typology Code available for payment source | Admitting: Obstetrics & Gynecology

## 2013-05-17 VITALS — BP 164/93 | HR 99 | Temp 97.4°F | Resp 20 | Ht 63.25 in | Wt 175.9 lb

## 2013-05-17 DIAGNOSIS — Z01419 Encounter for gynecological examination (general) (routine) without abnormal findings: Secondary | ICD-10-CM

## 2013-05-17 LAB — POCT URINALYSIS DIP (DEVICE)
Ketones, ur: NEGATIVE mg/dL
Protein, ur: >=300 mg/dL — AB
Specific Gravity, Urine: >=1.03 (ref 1.005–1.030)
pH: 5.5 (ref 5.0–8.0)

## 2013-05-17 NOTE — Progress Notes (Signed)
Subjective:    Patricia [name] is a 57 y.o. female who presents for an annual exam. The patient has no complaints today. She has a groin boil that "busted" today in the shower. The patient is not currently sexually active. GYN screening history: last pap: was normal. The patient wears seatbelts: yes. The patient participates in regular exercise: yes. Has the patient ever been transfused or tattooed?: no. The patient reports that there is not domestic violence in her life.   Menstrual History: OB History   Grav Para Term Preterm Abortions TAB SAB Ect Mult Living   2 2 2       2       Menarche age: 73 [place]: 40  No LMP recorded. Patient has had a hysterectomy.    The following portions of the patient's history were reviewed and updated as appropriate: allergies, current medications, past family history, past medical history, past social history, past surgical history and problem list.  Review of Systems A comprehensive review of systems was negative. She is divorced and abstinent of about 2 years. Her last mammogram was about 2 years ago. She had a hysterectomy done for what sounds like a fibroid. She is unemployed, looking for a job.   Objective:    BP 164/93  Pulse 99  Temp(Src) 97.4 F (36.3 C) (Oral)  Resp 20  Ht 5' 3.25" (1.607 m)  Wt 175 lb 14.4 oz (79.788 kg)  BMI 30.9 kg/m2  General Appearance:    Alert, cooperative, no distress, appears stated age  Head:    Normocephalic, without obvious abnormality, atraumatic  Eyes:    PERRL, conjunctiva/corneas clear, EOM's intact, fundi    benign, both eyes  Ears:    Normal TM's and external ear canals, both ears  Nose:   Nares normal, septum midline, mucosa normal, no drainage    or sinus tenderness  Throat:   Lips, mucosa, and tongue normal; teeth and gums normal  Neck:   Supple, symmetrical, trachea midline, no adenopathy;    thyroid:  no enlargement/tenderness/nodules; no carotid   bruit or JVD  Back:     Symmetric, no  curvature, ROM normal, no CVA tenderness  Lungs:     Clear to auscultation bilaterally, respirations unlabored  Chest Wall:    No tenderness or deformity   Heart:    Regular rate and rhythm, S1 and S2 normal, no murmur, rub   or gallop  Breast Exam:    No tenderness, masses, or nipple abnormality  Abdomen:     Soft, non-tender, bowel sounds active all four quadrants,    no masses, no organomegaly  Genitalia:    Normal female without lesion, discharge or tenderness, mild atrophy, no cervix/normal atrophic vaginal cuff, normal bimanual exam (no masses)     Extremities:   Extremities normal, atraumatic, no cyanosis or edema  Pulses:   2+ and symmetric all extremities  Skin:   Skin color, texture, turgor normal, no rashes or lesions  Lymph nodes:   Cervical, supraclavicular, and axillary nodes normal  Neurologic:   CNII-XII intact, normal strength, sensation and reflexes    throughout  .    Assessment:    Healthy female exam.    Plan:     Breast self exam technique reviewed and patient encouraged to perform self-exam monthly. Mammogram.

## 2013-05-17 NOTE — Progress Notes (Signed)
Pt reports having odor with urine and a boil on her groin.

## 2013-05-17 NOTE — Progress Notes (Signed)
Patient ID: DE MINK, female   DOB: 1956/09/12, 58 y.o.   MRN: [id number]

## 2013-05-26 ENCOUNTER — Encounter (HOSPITAL_COMMUNITY): Payer: Self-pay | Admitting: *Deleted

## 2013-05-26 ENCOUNTER — Ambulatory Visit (HOSPITAL_COMMUNITY)
Admission: RE | Admit: 2013-05-26 | Discharge: 2013-05-26 | Disposition: A | Discharge status: Home / Self Care | Payer: No Typology Code available for payment source | Source: Ambulatory Visit | Attending: Obstetrics & Gynecology | Admitting: Obstetrics & Gynecology

## 2013-05-26 ENCOUNTER — Emergency Department (HOSPITAL_COMMUNITY)
Admission: EM | Admit: 2013-05-26 | Discharge: 2013-05-26 | Disposition: A | Discharge status: Home / Self Care | Payer: No Typology Code available for payment source | Attending: Emergency Medicine | Admitting: Emergency Medicine

## 2013-05-26 DIAGNOSIS — Z01419 Encounter for gynecological examination (general) (routine) without abnormal findings: Secondary | ICD-10-CM

## 2013-05-26 DIAGNOSIS — R739 Hyperglycemia, unspecified: Secondary | ICD-10-CM

## 2013-05-26 DIAGNOSIS — Z90710 Acquired absence of both cervix and uterus: Secondary | ICD-10-CM | POA: Insufficient documentation

## 2013-05-26 DIAGNOSIS — Z1231 Encounter for screening mammogram for malignant neoplasm of breast: Secondary | ICD-10-CM | POA: Insufficient documentation

## 2013-05-26 DIAGNOSIS — Z7982 Long term (current) use of aspirin: Secondary | ICD-10-CM | POA: Insufficient documentation

## 2013-05-26 DIAGNOSIS — I10 Essential (primary) hypertension: Secondary | ICD-10-CM | POA: Insufficient documentation

## 2013-05-26 DIAGNOSIS — E1169 Type 2 diabetes mellitus with other specified complication: Secondary | ICD-10-CM | POA: Insufficient documentation

## 2013-05-26 DIAGNOSIS — E669 Obesity, unspecified: Secondary | ICD-10-CM | POA: Insufficient documentation

## 2013-05-26 DIAGNOSIS — N390 Urinary tract infection, site not specified: Secondary | ICD-10-CM

## 2013-05-26 LAB — CBC WITH DIFFERENTIAL/PLATELET
Basophils Absolute: 0 10*3/uL (ref 0.0–0.1)
Basophils Relative: 0 % (ref 0–1)
Eosinophils Relative: 2 % (ref 0–5)
HCT: 31.4 % — ABNORMAL LOW (ref 36.0–46.0)
Hemoglobin: 11.3 g/dL — ABNORMAL LOW (ref 12.0–15.0)
MCHC: 36 g/dL (ref 30.0–36.0)
MCV: 84.2 fL (ref 78.0–100.0)
Monocytes Absolute: 0.4 10*3/uL (ref 0.1–1.0)
Monocytes Relative: 5 % (ref 3–12)
RDW: 13.7 % (ref 11.5–15.5)

## 2013-05-26 LAB — COMPREHENSIVE METABOLIC PANEL
AST: 25 U/L (ref 0–37)
Albumin: 2.9 g/dL — ABNORMAL LOW (ref 3.5–5.2)
BUN: 16 mg/dL (ref 6–23)
CO2: 24 mEq/L (ref 19–32)
Calcium: 9.5 mg/dL (ref 8.4–10.5)
Creatinine, Ser: 0.76 mg/dL (ref 0.50–1.10)
GFR calc non Af Amer: >90 mL/min (ref >90)

## 2013-05-26 LAB — URINALYSIS, ROUTINE W REFLEX MICROSCOPIC
Glucose, UA: >1000 mg/dL — AB
Ketones, ur: NEGATIVE mg/dL
Nitrite: POSITIVE — AB
Protein, ur: 100 mg/dL — AB

## 2013-05-26 LAB — URINE MICROSCOPIC-ADD ON

## 2013-05-26 LAB — LIPASE, BLOOD: Lipase: 58 U/L (ref 11–59)

## 2013-05-26 MED ORDER — CEPHALEXIN 500 MG PO CAPS: 500.0000 mg | ORAL_CAPSULE | Freq: Four times a day (QID) | ORAL | Status: DC

## 2013-05-26 MED ORDER — INSULIN ASPART 100 UNIT/ML ~~LOC~~ SOLN
10.0000 [IU] | Freq: Once | SUBCUTANEOUS | Status: AC
  Administered 2013-05-26: 10 [IU] via SUBCUTANEOUS
  Filled 2013-05-26: qty 1

## 2013-05-26 MED ORDER — DEXTROSE 5 % IV SOLN
1.0000 g | Freq: Once | INTRAVENOUS | Status: AC
  Administered 2013-05-26: 1 g via INTRAVENOUS
  Filled 2013-05-26: qty 10

## 2013-05-26 NOTE — ED Notes (Signed)
Patient with lower back pain and left lower abdominal pain since yesterday.  Patient states that she has been having some frequency in urination.  Patient states that the pain got worse today.

## 2013-05-26 NOTE — ED Provider Notes (Signed)
CSN: [id number]     Arrival date & time 05/26/13  1638 History     First MD Initiated Contact with Patient 05/26/13 1817     Chief Complaint  Patient presents with  . Flank Pain   (Consider location/radiation/quality/duration/timing/severity/associated sxs/prior Treatment) HPI  57 year old female with history of insulin-dependent diabetes, hypertension, and obesity presents complaining of flank pain. Patient reports for the past week she noticed strong odor in her urine. Yesterday she developed a gradual onset of pain to her left flank which radiates to her left groin. Pain is a sharp stabbing sensation worsening with movement. No complaints of fever, chills, headache, chest pain, shortness of breath, abdominal pain, nausea, vomiting, diarrhea, dysuria, hematuria, hematochezia, melena, vaginal discharge, or rash. She is not sexually active. She has had abdominal hysterectomy in the past. She also reports being out of her insulin for 2 months due to inability to afford. She has not been checking her blood sugar. she is currently taking metformin and glipizide. Her last UTI was a year ago.  Past Medical History  Diagnosis Date  . Diabetes mellitus without complication   . Hypertension   . Obesity    Past Surgical History  Procedure Laterality Date  . Cesarean section  1982, 1988  . Abdominal hysterectomy    . Breast surgery      reduction   Family History  Problem Relation Age of Onset  . Diabetes Brother   . Cancer Brother     stomach   History  Substance Use Topics  . Smoking status: Never Smoker   . Smokeless tobacco: Not on file  . Alcohol Use: No   OB History   Grav Para Term Preterm Abortions TAB SAB Ect Mult Living   2 2 2       2      Review of Systems  All other systems reviewed and are negative.    Allergies  Sulfa antibiotics  Home Medications   Current Outpatient Rx  Name  Route  Sig  Dispense  Refill  . amLODipine (NORVASC) 5 MG tablet   Oral  Take 1 tablet (5 mg total) by mouth daily.   90 tablet   3   . aspirin 81 MG tablet   Oral   Take 81 mg by mouth daily.         Marland Kitchen glipiZIDE (GLUCOTROL) 10 MG tablet   Oral   Take 1 tablet (10 mg total) by mouth 2 (two) times daily before a meal.   60 tablet   6   . metFORMIN (GLUCOPHAGE) 1000 MG tablet   Oral   Take 1 tablet (1,000 mg total) by mouth 2 (two) times daily.   60 tablet   12    BP 155/74  Pulse 94  Temp(Src) 97.9 F (36.6 C)  Resp 22  SpO2 99% Physical Exam  Nursing note and vitals reviewed. Constitutional: She appears well-developed and well-nourished. No distress.  Awake, alert, nontoxic appearance  HENT:  Head: Atraumatic.  Eyes: Conjunctivae are normal. Right eye exhibits no discharge. Left eye exhibits no discharge.  Neck: Neck supple.  Cardiovascular: Normal rate and regular rhythm.   Pulmonary/Chest: Effort normal. No respiratory distress. She exhibits no tenderness.  Abdominal: Soft. She exhibits no distension. There is tenderness (Mild tenderness to left suprapubic region on palpation without guarding or rebound tenderness. Normal hip flexion and extension bilaterally.). There is no rebound and no guarding.  Genitourinary:  Mild left CVA, left low back tenderness on percussion.  Musculoskeletal: She exhibits no tenderness.  ROM appears intact, no obvious focal weakness  Neurological:  Mental status and motor strength appears intact  Skin: No rash noted.  Psychiatric: She has a normal mood and affect.    ED Course   Procedures (including critical care time)  Patient complains of strong urine odor, and left flank pain. Labs remarkable for a urinary tract infection. He also has elevated CBG 500 with normal anion gap and no ketones in the urine. She is afebrile, and hemodynamically stable.  8:26 PM Patient receive her Rocephin here. This CBG improves to 244 from 508 after 10 units of insulin were given.  She agrees to f/u with PCP for further  management of her diabetes.  Will also discharge with abx for her UTI.  Urine culture sent.  Return precaution discussed.     Labs Reviewed  CBC WITH DIFFERENTIAL - Abnormal; Notable for the following:    RBC 3.73 (*)    Hemoglobin 11.3 (*)    HCT 31.4 (*)    Neutrophils Relative % 42 (*)    Lymphocytes Relative 51 (*)    All other components within normal limits  COMPREHENSIVE METABOLIC PANEL - Abnormal; Notable for the following:    Sodium 131 (*)    Glucose, Bld 508 (*)    Albumin 2.9 (*)    Alkaline Phosphatase 119 (*)    Total Bilirubin 0.1 (*)    All other components within normal limits  URINALYSIS, ROUTINE W REFLEX MICROSCOPIC - Abnormal; Notable for the following:    APPearance CLOUDY (*)    Glucose, UA >1000 (*)    Protein, ur 100 (*)    Nitrite POSITIVE (*)    Leukocytes, UA SMALL (*)    All other components within normal limits  URINE MICROSCOPIC-ADD ON - Abnormal; Notable for the following:    Squamous Epithelial / LPF FEW (*)    Bacteria, UA MANY (*)    Casts HYALINE CASTS (*)    All other components within normal limits  URINE CULTURE  LIPASE, BLOOD   Mm Digital Screening  05/26/2013   *RADIOLOGY REPORT*  Clinical Data: Screening.  DIGITAL SCREENING BILATERAL MAMMOGRAM WITH CAD  Comparison: Previous exam(s).  FINDINGS:  ACR Breast Density Category a:  The breast tissue is almost entirely fatty.  There are no findings suspicious for malignancy.  Images were processed with CAD.  IMPRESSION: No mammographic evidence of malignancy.  A result letter of this screening mammogram will be mailed directly to the patient.  RECOMMENDATION: Screening mammogram in one year. (Code:SM-B-01Y)  BI-RADS CATEGORY 1:  Negative.   Original Report Authenticated By: Nolon Nations, M.D.   1. UTI (lower urinary tract infection)   2. Hyperglycemia without ketosis     MDM  BP 156/76  Pulse 99  Temp(Src) 98.1 F (36.7 C) (Oral)  Resp 17  SpO2 99%  I have reviewed nursing notes and  vital signs.  I reviewed available ER/hospitalization records thought the EMR   Domenic Moras, [place] 05/26/13 2030

## 2013-05-26 NOTE — ED Notes (Signed)
Lt sided flank pain with llq pain since yesterday and getting worse.  lmp none

## 2013-05-27 NOTE — ED Provider Notes (Signed)
Medical screening examination/treatment/procedure(s) were performed by non-physician practitioner and as supervising physician I was immediately available for consultation/collaboration.  Merryl Hacker, MD 05/27/13 [phone]

## 2013-05-28 LAB — URINE CULTURE: Colony Count: >=100000

## 2013-06-01 NOTE — ED Notes (Signed)
+   Urine Culture- treated with appropriate medication per protocol MD.

## 2013-06-01 NOTE — ED Notes (Deleted)
+   Urine Culture- treated with appropriate medication per protocol MD.

## 2013-06-14 ENCOUNTER — Encounter: Payer: Self-pay | Admitting: *Deleted

## 2013-08-05 ENCOUNTER — Encounter (HOSPITAL_COMMUNITY): Payer: Self-pay | Admitting: Emergency Medicine

## 2013-08-05 ENCOUNTER — Emergency Department (HOSPITAL_COMMUNITY)
Admission: EM | Admit: 2013-08-05 | Discharge: 2013-08-05 | Disposition: A | Discharge status: Home / Self Care | Payer: No Typology Code available for payment source | Attending: Emergency Medicine | Admitting: Emergency Medicine

## 2013-08-05 DIAGNOSIS — E119 Type 2 diabetes mellitus without complications: Secondary | ICD-10-CM | POA: Insufficient documentation

## 2013-08-05 DIAGNOSIS — Z7982 Long term (current) use of aspirin: Secondary | ICD-10-CM | POA: Insufficient documentation

## 2013-08-05 DIAGNOSIS — E669 Obesity, unspecified: Secondary | ICD-10-CM | POA: Insufficient documentation

## 2013-08-05 DIAGNOSIS — R51 Headache: Secondary | ICD-10-CM | POA: Insufficient documentation

## 2013-08-05 DIAGNOSIS — L089 Local infection of the skin and subcutaneous tissue, unspecified: Secondary | ICD-10-CM

## 2013-08-05 DIAGNOSIS — L723 Sebaceous cyst: Secondary | ICD-10-CM | POA: Insufficient documentation

## 2013-08-05 DIAGNOSIS — Z792 Long term (current) use of antibiotics: Secondary | ICD-10-CM | POA: Insufficient documentation

## 2013-08-05 DIAGNOSIS — Z79899 Other long term (current) drug therapy: Secondary | ICD-10-CM | POA: Insufficient documentation

## 2013-08-05 DIAGNOSIS — Z794 Long term (current) use of insulin: Secondary | ICD-10-CM | POA: Insufficient documentation

## 2013-08-05 DIAGNOSIS — I10 Essential (primary) hypertension: Secondary | ICD-10-CM | POA: Insufficient documentation

## 2013-08-05 NOTE — ED Provider Notes (Signed)
CSN: [id number]     Arrival date & time 08/05/13  1200 History  This chart was scribed for non-physician practitioner Alecia Lemming PA-C working with Carmin Muskrat, MD by Ludger Nutting, ED Scribe. This patient was seen in room TR05C/TR05C and the patient's care was started at 1200.    Chief Complaint  Patient presents with  . Abscess    The history is provided by the patient. No language interpreter was used.    HPI Comments: Patricia [name] is a 57 y.o. female who presents to the Emergency Department complaining of 1 week of gradual onset, gradually worsening, constant abscess to the forehead. She has associated HA from the location of the abscess. Pt states she had had the spot for a long time but it only grew in size and pain in the past 1 week. She has applied hot and cold compresses, antibiotic cream, and peroxide without relief. She states she has tried to pop it without relief. She denies fevers, nausea, vomiting.   Past Medical History  Diagnosis Date  . Diabetes mellitus without complication   . Hypertension   . Obesity    Past Surgical History  Procedure Laterality Date  . Cesarean section  1982, 1988  . Abdominal hysterectomy    . Breast surgery      reduction   Family History  Problem Relation Age of Onset  . Diabetes Brother   . Cancer Brother     stomach   History  Substance Use Topics  . Smoking status: Never Smoker   . Smokeless tobacco: Not on file  . Alcohol Use: No   OB History   Grav Para Term Preterm Abortions TAB SAB Ect Mult Living   2 2 2       2      Review of Systems  Constitutional: Negative for fever.  Gastrointestinal: Negative for nausea and vomiting.  Skin: Positive for wound (abscess to the forehead). Negative for color change.       Positive for abscess.  Neurological: Positive for headaches.  Hematological: Negative for adenopathy.    Allergies  Sulfa antibiotics  Home Medications   Current Outpatient Rx  Name  Route  Sig   Dispense  Refill  . amLODipine (NORVASC) 5 MG tablet   Oral   Take 1 tablet (5 mg total) by mouth daily.   90 tablet   3   . aspirin 81 MG tablet   Oral   Take 81 mg by mouth daily.         Marland Kitchen glipiZIDE (GLUCOTROL) 10 MG tablet   Oral   Take 1 tablet (10 mg total) by mouth 2 (two) times daily before a meal.   60 tablet   6   . ibuprofen (ADVIL,MOTRIN) 200 MG tablet   Oral   Take 200 mg by mouth every 6 (six) hours as needed for pain.         Marland Kitchen insulin glargine (LANTUS) 100 UNIT/ML injection   Subcutaneous   Inject 45 Units into the skin at bedtime.         . metFORMIN (GLUCOPHAGE) 1000 MG tablet   Oral   Take 1 tablet (1,000 mg total) by mouth 2 (two) times daily.   60 tablet   12   . cephALEXin (KEFLEX) 500 MG capsule   Oral   Take 1 capsule (500 mg total) by mouth 4 (four) times daily.   28 capsule   0    BP 153/82  Pulse  103  Temp(Src) 98.5 F (36.9 C) (Oral)  Resp 18  Ht 5\' 6"  (1.676 m)  Wt 180 lb (81.647 kg)  BMI 29.07 kg/m2  SpO2 100% Physical Exam  Nursing note and vitals reviewed. Constitutional: She appears well-developed and well-nourished.  HENT:  Head: Normocephalic and atraumatic.  1 cm nodule left forehead without surrounding cellulitis. No drainage.   Eyes: Conjunctivae and EOM are normal.  Neck: Normal range of motion. Neck supple.  Pulmonary/Chest: Effort normal. No respiratory distress.  Abdominal: She exhibits no distension.  Musculoskeletal: Normal range of motion.  Neurological: She is alert.  Skin: Skin is warm and dry.  Psychiatric: She has a normal mood and affect. Her behavior is normal.    ED Course  Procedures   DIAGNOSTIC STUDIES: Oxygen Saturation is 100% on RA, normal by my interpretation.    COORDINATION OF CARE: 12:49 PM Discussed treatment plan with pt at bedside and pt agreed to plan. Discussed I&D versus conservative management with warm compresses/antibiotics. Discussed risks of scarring. Patient agrees to  proceed.  INCISION AND DRAINAGE PROCEDURE NOTE: Patient identification was confirmed and verbal consent was obtained. This procedure was performed by Alecia Lemming PA-C  at 1:39 PM. Site: L forehead Sterile procedures observed Needle size: 27g Anesthetic used (type and amt): 1cc 2% lidocaine with epi Blade size: 11 Drainage: moderate Complexity: Complex Packing not used Site anesthetized, incision made over site, wound drained.   Small 1-2 mm incision made as to minimize scaring. Thick, cheesy drainage consistent with sebaceous cyst expressed.  Pt tolerated procedure well without complications.  Instructions for care discussed verbally and pt provided with additional written instructions for homecare and f/u.  The patient was urged to return to the Emergency Department urgently with worsening pain, swelling, expanding erythema especially if it streaks away from the affected area, fever, or if they have any other concerns.   The patient was urged to return to the Emergency Department or go to their PCP in 48 hours for wound recheck if the area is not significantly improved.  The patient verbalized understanding and stated agreement with this plan.     Labs Review Labs Reviewed - No data to display Imaging Review No results found.  EKG Interpretation   None       MDM   1. Infected sebaceous cyst    Patient with sebaceous cyst drainage performed without complication. There is no cellulitis associated with the cyst. Area is small. No systemic symptoms of illness. Patient is a diabetic, however given the appearance of the wound the do not feel patient requires antibiotics. Appropriate return instructions given.  I personally performed the services described in this documentation, which was scribed in my presence. The recorded information has been reviewed and is accurate.   Carlisle Cater, PA-C 08/05/13 1534

## 2013-08-05 NOTE — ED Notes (Signed)
Pt reports a abscess on her face for the past week. States that she has not been able to get anything out of it at home. Tender with palpation.

## 2013-08-06 NOTE — ED Provider Notes (Signed)
  Medical screening examination/treatment/procedure(s) were performed by non-physician practitioner and as supervising physician I was immediately available for consultation/collaboration.     Carmin Muskrat, MD 08/06/13 [phone]

## 2013-09-14 ENCOUNTER — Ambulatory Visit: Payer: No Typology Code available for payment source | Attending: Internal Medicine | Admitting: Internal Medicine

## 2013-09-14 VITALS — BP 168/98 | HR 105 | Temp 98.9°F | Resp 14 | Ht 65.0 in | Wt 167.4 lb

## 2013-09-14 DIAGNOSIS — F3289 Other specified depressive episodes: Secondary | ICD-10-CM | POA: Insufficient documentation

## 2013-09-14 DIAGNOSIS — F329 Major depressive disorder, single episode, unspecified: Secondary | ICD-10-CM

## 2013-09-14 DIAGNOSIS — E785 Hyperlipidemia, unspecified: Secondary | ICD-10-CM

## 2013-09-14 DIAGNOSIS — E119 Type 2 diabetes mellitus without complications: Secondary | ICD-10-CM

## 2013-09-14 DIAGNOSIS — I10 Essential (primary) hypertension: Secondary | ICD-10-CM

## 2013-09-14 DIAGNOSIS — Z23 Encounter for immunization: Secondary | ICD-10-CM

## 2013-09-14 MED ORDER — ESCITALOPRAM OXALATE 10 MG PO TABS: 10.0000 mg | ORAL_TABLET | Freq: Every day | ORAL | Status: DC

## 2013-09-14 MED ORDER — AMLODIPINE BESYLATE 5 MG PO TABS: 5.0000 mg | ORAL_TABLET | Freq: Every day | ORAL | Status: DC

## 2013-09-14 MED ORDER — GLIPIZIDE 10 MG PO TABS: 10.0000 mg | ORAL_TABLET | Freq: Two times a day (BID) | ORAL | Status: DC

## 2013-09-14 MED ORDER — LISINOPRIL-HYDROCHLOROTHIAZIDE 10-12.5 MG PO TABS: 1.0000 | ORAL_TABLET | Freq: Every day | ORAL | Status: DC

## 2013-09-14 MED ORDER — DAPAGLIFLOZIN PROPANEDIOL 5 MG PO TABS: 5.0000 mg | ORAL_TABLET | Freq: Every day | ORAL | Status: DC

## 2013-09-14 NOTE — Progress Notes (Signed)
Pt is here for f/u for medication refill. Pt complains that she suffers from stress and requests a medication to cope. Needs an HBA1c.

## 2013-09-14 NOTE — Progress Notes (Signed)
Patient ID: Patricia [name], female   DOB: 1956/10/12, 57 y.o.   MRN: [id number] Patient Demographics  Patricia [name], is a 57 y.o. female  [id number]  [id number]  DOB - [birth year]  Chief Complaint  Patient presents with  . Medication Refill        Subjective:   [name] today is here for a follow up visit. Patient is a 57 year old female with history of hypertension, diabetes, dyslipidemia has been out of job since April 2014, feels depression, currently living with her daughter who has a child as well. She feels depressed about her situation. Also patient is not taking her Lantus insulin for last 5 months as she has not been able to afford it as well as Norvasc.  Patient has No headache, No chest pain, No abdominal pain - No Nausea, No new weakness tingling or numbness, No Cough - SOB.   Objective:    Filed Vitals:   09/14/13 1049  BP: 168/98  Pulse: 105  Temp: 98.9 F (37.2 C)  TempSrc: Oral  Resp: 14  Height: 5\' 5"  (1.651 m)  Weight: 167 lb 6.4 oz (75.932 kg)  SpO2: 100%     ALLERGIES:   Allergies  Allergen Reactions  . Sulfa Antibiotics Itching and Rash    PAST MEDICAL HISTORY: Past Medical History  Diagnosis Date  . Diabetes mellitus without complication   . Hypertension   . Obesity     MEDICATIONS AT HOME: Prior to Admission medications   Medication Sig Start Date End Date Taking? Authorizing Provider  glipiZIDE (GLUCOTROL) 10 MG tablet Take 1 tablet (10 mg total) by mouth 2 (two) times daily before a meal. 09/14/13  Yes Ripudeep K Rai, MD  lisinopril-hydrochlorothiazide (PRINZIDE,ZESTORETIC) 10-12.5 MG per tablet Take 1 tablet by mouth daily. 09/14/13  Yes Ripudeep Krystal Eaton, MD  metFORMIN (GLUCOPHAGE) 1000 MG tablet Take 1 tablet (1,000 mg total) by mouth 2 (two) times daily. 02/08/13  Yes Acquanetta Chain, DO  amLODipine (NORVASC) 5 MG tablet Take 1 tablet (5 mg total) by mouth daily. 09/14/13   Ripudeep Krystal Eaton, MD  aspirin 81 MG tablet Take 81 mg by  mouth daily.    Historical Provider, MD  Dapagliflozin Propanediol 5 MG TABS Take 5 mg by mouth daily. 09/14/13   Ripudeep Krystal Eaton, MD  escitalopram (LEXAPRO) 10 MG tablet Take 1 tablet (10 mg total) by mouth daily. 09/14/13   Ripudeep Krystal Eaton, MD  ibuprofen (ADVIL,MOTRIN) 200 MG tablet Take 200 mg by mouth every 6 (six) hours as needed for pain.    Historical Provider, MD     Exam  General appearance :Awake, alert, NAD, Speech Clear.  HEENT: Atraumatic and Normocephalic, PERLA Neck: supple, no JVD. No cervical lymphadenopathy.  Chest: Clear to auscultation bilaterally, no wheezing, rales or rhonchi CVS: S1 S2 regular, no murmurs.  Abdomen: soft, NBS, NT, ND, no gaurding, rigidity or rebound. Extremities: no cyanosis or clubbing, B/L Lower Ext shows no edema Neurology: Awake alert, and oriented X 3, CN II-XII intact, Non focal Skin: No Rash or lesions Wounds:N/A    Data Review   Basic Metabolic Panel: No results found for this basename: NA, K, CL, CO2, GLUCOSE, BUN, CREATININE, CALCIUM, MG, PHOS,  in the last 168 hours Liver Function Tests: No results found for this basename: AST, ALT, ALKPHOS, BILITOT, PROT, ALBUMIN,  in the last 168 hours  CBC: No results found for this basename: WBC, NEUTROABS, HGB, HCT, MCV, PLT,  in the last 168 hours  ------------------------------------------------------------------------------------------------------------------  Recent Labs  09/14/13 1120  HGBA1C 10.0   ------------------------------------------------------------------------------------------------------------------ No results found for this basename: CHOL, HDL, LDLCALC, TRIG, CHOLHDL, LDLDIRECT,  in the last 72 hours ------------------------------------------------------------------------------------------------------------------ No results found for this basename: TSH, T4TOTAL, FREET3, T3FREE, THYROIDAB,  in the last 72  hours ------------------------------------------------------------------------------------------------------------------ No results found for this basename: VITAMINB12, FOLATE, FERRITIN, TIBC, IRON, RETICCTPCT,  in the last 72 hours  Coagulation profile  No results found for this basename: INR, PROTIME,  in the last 168 hours    Assessment & Plan   Active Problems: Patient Active Problem List   Diagnosis Date Noted  . Depression - Started patient on Lexapro 10 mg daily   09/14/2013  . Dyslipidemia - Check lipid panel next visit   04/18/2013  . Diabetes - Uncontrolled, hemoglobin A1c 10.0 today  - Patient has not been taking Lantus for last 5 months as she was not able to afford it  - Continue metformin 1000mg  BID, glipizide 10mg  BID, start on farxega 5 mg daily - Recheck hemoglobin A1c and blood sugars next visit  - Discussed with pharmacy they will give her the [place] today  03/10/2013  . HTN (hypertension) - Continue lisinopril/HCTZ,  - Rerdered Norvasc,she will pick it up from the clinic pharmacy  03/10/2013    Flu shot today - She had a mammogram this year   Follow-up in 3 months     RAI,RIPUDEEP M.D. 09/14/2013, 11:38 AM

## 2013-10-17 ENCOUNTER — Telehealth: Payer: Self-pay

## 2013-10-23 ENCOUNTER — Other Ambulatory Visit: Payer: Self-pay | Admitting: Emergency Medicine

## 2013-10-23 MED ORDER — FREESTYLE SYSTEM KIT: 1.0000 | PACK | Status: DC | PRN

## 2013-12-13 ENCOUNTER — Ambulatory Visit: Payer: No Typology Code available for payment source | Attending: Internal Medicine | Admitting: Internal Medicine

## 2013-12-13 ENCOUNTER — Encounter: Payer: Self-pay | Admitting: Internal Medicine

## 2013-12-13 VITALS — BP 144/87 | HR 70 | Temp 98.8°F | Resp 17 | Wt 160.6 lb

## 2013-12-13 DIAGNOSIS — I10 Essential (primary) hypertension: Secondary | ICD-10-CM | POA: Insufficient documentation

## 2013-12-13 DIAGNOSIS — Z139 Encounter for screening, unspecified: Secondary | ICD-10-CM

## 2013-12-13 DIAGNOSIS — E119 Type 2 diabetes mellitus without complications: Secondary | ICD-10-CM | POA: Insufficient documentation

## 2013-12-13 DIAGNOSIS — N898 Other specified noninflammatory disorders of vagina: Secondary | ICD-10-CM

## 2013-12-13 LAB — POCT URINALYSIS DIPSTICK
Bilirubin, UA: NEGATIVE
Ketones, UA: NEGATIVE
Leukocytes, UA: NEGATIVE
NITRITE UA: NEGATIVE
PH UA: 5.5
RBC UA: NEGATIVE
Spec Grav, UA: 1.015
UROBILINOGEN UA: 0.2

## 2013-12-13 LAB — COMPLETE METABOLIC PANEL WITH GFR
ALT: 10 U/L (ref 0–35)
AST: 12 U/L (ref 0–37)
Albumin: 3.5 g/dL (ref 3.5–5.2)
Alkaline Phosphatase: 76 U/L (ref 39–117)
BUN: 24 mg/dL — ABNORMAL HIGH (ref 6–23)
CALCIUM: 9.3 mg/dL (ref 8.4–10.5)
CHLORIDE: 103 meq/L (ref 96–112)
CO2: 24 meq/L (ref 19–32)
Creat: 1.14 mg/dL — ABNORMAL HIGH (ref 0.50–1.10)
GFR, EST AFRICAN AMERICAN: 63 mL/min
GFR, Est Non African American: 54 mL/min — ABNORMAL LOW
Glucose, Bld: 222 mg/dL — ABNORMAL HIGH (ref 70–99)
Potassium: 4.4 mEq/L (ref 3.5–5.3)
SODIUM: 137 meq/L (ref 135–145)
TOTAL PROTEIN: 6.7 g/dL (ref 6.0–8.3)
Total Bilirubin: 0.4 mg/dL (ref 0.2–1.2)

## 2013-12-13 LAB — LIPID PANEL
Cholesterol: 210 mg/dL — ABNORMAL HIGH (ref 0–200)
HDL: 41 mg/dL (ref >39)
LDL CALC: 115 mg/dL — AB (ref 0–99)
Total CHOL/HDL Ratio: 5.1 Ratio
Triglycerides: 270 mg/dL — ABNORMAL HIGH (ref <150)
VLDL: 54 mg/dL — ABNORMAL HIGH (ref 0–40)

## 2013-12-13 LAB — POCT GLYCOSYLATED HEMOGLOBIN (HGB A1C)

## 2013-12-13 LAB — GLUCOSE, POCT (MANUAL RESULT ENTRY): POC GLUCOSE: 238 mg/dL — AB (ref 70–99)

## 2013-12-13 MED ORDER — METRONIDAZOLE 500 MG PO TABS: 500.0000 mg | ORAL_TABLET | Freq: Three times a day (TID) | ORAL | Status: DC

## 2013-12-13 MED ORDER — GLIPIZIDE 10 MG PO TABS: 20.0000 mg | ORAL_TABLET | Freq: Two times a day (BID) | ORAL | Status: DC

## 2013-12-13 MED ORDER — METFORMIN HCL 1000 MG PO TABS: 1000.0000 mg | ORAL_TABLET | Freq: Two times a day (BID) | ORAL | Status: DC

## 2013-12-13 MED ORDER — FLUCONAZOLE 150 MG PO TABS: 150.0000 mg | ORAL_TABLET | Freq: Once | ORAL | Status: DC

## 2013-12-13 NOTE — Progress Notes (Signed)
MRN: [id number] Name: Patricia [name]  Sex: female Age: 58 y.o. DOB: 12-02-1955  Allergies: Sulfa antibiotics  Chief Complaint  Patient presents with  . Vaginal Discharge    HPI: Patient is 58 y.o. female who has history of diabetes hypertension , comes today for followup she has been taking metformin Glucotrol 10 mg twice and farxiga, she also is on lisinopril/hydrochlorothiazide as per patient she did not take her blood pressure medication today, her blood pressure is borderline high 144/87 denies any headache dizziness, she reported to have recently vaginal discharge whitish in color and feels itching, denies any fever chills any urinary symptoms.  Past Medical History  Diagnosis Date  . Diabetes mellitus without complication   . Hypertension   . Obesity     Past Surgical History  Procedure Laterality Date  . Cesarean section  1982, 1988  . Abdominal hysterectomy    . Breast surgery      reduction      Medication List       This list is accurate as of: 12/13/13 11:25 AM.  Always use your most recent med list.               amLODipine 5 MG tablet  Commonly known as:  NORVASC  Take 1 tablet (5 mg total) by mouth daily.     aspirin 81 MG tablet  Take 81 mg by mouth daily.     Dapagliflozin Propanediol 5 MG Tabs  Take 5 mg by mouth daily.     escitalopram 10 MG tablet  Commonly known as:  LEXAPRO  Take 1 tablet (10 mg total) by mouth daily.     fluconazole 150 MG tablet  Commonly known as:  DIFLUCAN  Take 1 tablet (150 mg total) by mouth once.     glipiZIDE 10 MG tablet  Commonly known as:  GLUCOTROL  Take 2 tablets (20 mg total) by mouth 2 (two) times daily before a meal.     glucose monitoring kit monitoring kit  1 each by Does not apply route as needed for other.     ibuprofen 200 MG tablet  Commonly known as:  ADVIL,MOTRIN  Take 200 mg by mouth every 6 (six) hours as needed for pain.     lisinopril-hydrochlorothiazide 10-12.5 MG per tablet    Commonly known as:  PRINZIDE,ZESTORETIC  Take 1 tablet by mouth daily.     metFORMIN 1000 MG tablet  Commonly known as:  GLUCOPHAGE  Take 1 tablet (1,000 mg total) by mouth 2 (two) times daily.     metroNIDAZOLE 500 MG tablet  Commonly known as:  FLAGYL  Take 1 tablet (500 mg total) by mouth 3 (three) times daily.        Meds ordered this encounter  Medications  . glipiZIDE (GLUCOTROL) 10 MG tablet    Sig: Take 2 tablets (20 mg total) by mouth 2 (two) times daily before a meal.    Dispense:  120 tablet    Refill:  4  . fluconazole (DIFLUCAN) 150 MG tablet    Sig: Take 1 tablet (150 mg total) by mouth once.    Dispense:  1 tablet    Refill:  0  . metroNIDAZOLE (FLAGYL) 500 MG tablet    Sig: Take 1 tablet (500 mg total) by mouth 3 (three) times daily.    Dispense:  21 tablet    Refill:  0  . metFORMIN (GLUCOPHAGE) 1000 MG tablet    Sig: Take 1 tablet (  1,000 mg total) by mouth 2 (two) times daily.    Dispense:  60 tablet    Refill:  12    Immunization History  Administered Date(s) Administered  . Influenza Split 09/14/2013    Family History  Problem Relation Age of Onset  . Diabetes Brother   . Cancer Brother     stomach    History  Substance Use Topics  . Smoking status: Never Smoker   . Smokeless tobacco: Not on file  . Alcohol Use: No    Review of Systems   As noted in HPI  Filed Vitals:   12/13/13 1102  BP: 144/87  Pulse: 70  Temp: 98.8 F (37.1 C)  Resp: 17    Physical Exam  Physical Exam  Constitutional: No distress.  Eyes: EOM are normal. Pupils are equal, round, and reactive to light.  Cardiovascular: Normal rate and regular rhythm.   Pulmonary/Chest: Breath sounds normal. No respiratory distress. She has no wheezes. She has no rales.  Abdominal: Soft. There is no tenderness. There is no rebound and no guarding.  No CVA tenderness     CBC    Component Value Date/Time   WBC 7.0 05/26/2013 1708   RBC 3.73* 05/26/2013 1708   HGB  11.3* 05/26/2013 1708   HCT 31.4* 05/26/2013 1708   PLT 255 05/26/2013 1708   MCV 84.2 05/26/2013 1708   LYMPHSABS 3.6 05/26/2013 1708   MONOABS 0.4 05/26/2013 1708   EOSABS 0.1 05/26/2013 1708   BASOSABS 0.0 05/26/2013 1708    CMP     Component Value Date/Time   NA 131* 05/26/2013 1708   K 4.6 05/26/2013 1708   CL 96 05/26/2013 1708   CO2 24 05/26/2013 1708   GLUCOSE 508* 05/26/2013 1708   BUN 16 05/26/2013 1708   CREATININE 0.76 05/26/2013 1708   CALCIUM 9.5 05/26/2013 1708   PROT 6.2 05/26/2013 1708   ALBUMIN 2.9* 05/26/2013 1708   AST 25 05/26/2013 1708   ALT 18 05/26/2013 1708   ALKPHOS 119* 05/26/2013 1708   BILITOT 0.1* 05/26/2013 1708   GFRNONAA >90 05/26/2013 1708   GFRAA >90 05/26/2013 1708    Lab Results  Component Value Date/Time   CHOL  Value: 206        ATP III CLASSIFICATION:  <200     mg/dL   Desirable  200-239  mg/dL   Borderline High  >=240    mg/dL   High* 03/28/2008  3:47 PM    No components found with this basename: hga1c    Lab Results  Component Value Date/Time   AST 25 05/26/2013  5:08 PM    Assessment and Plan Results for orders placed in visit on 12/13/13  GLUCOSE, POCT (MANUAL RESULT ENTRY)      Result Value Ref Range   POC Glucose 238 (*) 70 - 99 mg/dl  POCT GLYCOSYLATED HEMOGLOBIN (HGB A1C)      Result Value Ref Range   Hemoglobin A1C 9.2%    POCT URINALYSIS DIPSTICK      Result Value Ref Range   Color, UA yellow     Clarity, UA clear     Glucose, UA 538m     Bilirubin, UA neg     Ketones, UA neg     Spec Grav, UA 1.015     Blood, UA neg     pH, UA 5.5     Protein, UA 1037m    Urobilinogen, UA 0.2     Nitrite, UA  neg     Leukocytes, UA Negative     Urine analysis negative for infection. DM (diabetes mellitus) uncontrolled her- Plan: Glucose (CBG), HgB A1c, I have increased the dose of Glucotrol to 20 mg twice a day, she'll continue with metformin 1 g twice a day as well as FARXIGA , glipiZIDE (GLUCOTROL) 10 MG tablet, metFORMIN (GLUCOPHAGE)  1000 MG tablet  Discharge of vagina - Plan: Urinalysis Dipstick, fluconazole (DIFLUCAN) 150 MG tablet, metroNIDAZOLE (FLAGYL) 500 MG tablet  Essential hypertension, benign Patient did not take her medication today continue with Norvasc lisinopril/hydrochlorothiazide.  Screening - Plan: Lipid panel, COMPLETE METABOLIC PANEL WITH GFR, Vit D  25 hydroxy (rtn osteoporosis monitoring)   Return in about 3 months (around 03/15/2014) for diabetes, hypertension.  Lorayne Marek, MD

## 2013-12-13 NOTE — Progress Notes (Signed)
Patient here for follow up- DM Complains of having a vaginal itch Some minor pain and slight discharge

## 2013-12-13 NOTE — Patient Instructions (Signed)

## 2013-12-14 ENCOUNTER — Telehealth: Payer: Self-pay

## 2013-12-14 LAB — VITAMIN D 25 HYDROXY (VIT D DEFICIENCY, FRACTURES): Vit D, 25-Hydroxy: 17 ng/mL — ABNORMAL LOW (ref 30–89)

## 2013-12-14 MED ORDER — VITAMIN D (ERGOCALCIFEROL) 1.25 MG (50000 UNIT) PO CAPS: 50000.0000 [IU] | ORAL_CAPSULE | ORAL | Status: DC

## 2013-12-14 MED ORDER — ATORVASTATIN CALCIUM 10 MG PO TABS: 10.0000 mg | ORAL_TABLET | Freq: Every day | ORAL | Status: DC

## 2013-12-14 NOTE — Telephone Encounter (Signed)
Message copied by Dorothe Pea on Thu Dec 14, 2013  2:56 PM ------      Message from: Lorayne Marek      Created: Thu Dec 14, 2013 10:22 AM       Blood work reviewed, noticed low vitamin D, call patient advise to start ergocalciferol 50,000 units once a week for the duration of  12 weeks.      Hyperlipidemia, advise patient to start taking Lipitor 10 mg daily. ------

## 2013-12-14 NOTE — Telephone Encounter (Signed)
Patient is aware of her lab results [facility]

## 2014-03-01 ENCOUNTER — Other Ambulatory Visit: Payer: Self-pay | Admitting: Internal Medicine

## 2014-03-01 DIAGNOSIS — E119 Type 2 diabetes mellitus without complications: Secondary | ICD-10-CM

## 2014-03-28 ENCOUNTER — Encounter: Payer: Self-pay | Admitting: Internal Medicine

## 2014-03-28 ENCOUNTER — Ambulatory Visit: Payer: Self-pay | Attending: Internal Medicine | Admitting: Internal Medicine

## 2014-03-28 VITALS — BP 158/91 | HR 92 | Temp 98.7°F | Resp 16 | Ht 65.0 in | Wt 162.0 lb

## 2014-03-28 DIAGNOSIS — Z79899 Other long term (current) drug therapy: Secondary | ICD-10-CM | POA: Insufficient documentation

## 2014-03-28 DIAGNOSIS — F329 Major depressive disorder, single episode, unspecified: Secondary | ICD-10-CM | POA: Insufficient documentation

## 2014-03-28 DIAGNOSIS — E119 Type 2 diabetes mellitus without complications: Secondary | ICD-10-CM

## 2014-03-28 DIAGNOSIS — K029 Dental caries, unspecified: Secondary | ICD-10-CM | POA: Insufficient documentation

## 2014-03-28 DIAGNOSIS — I10 Essential (primary) hypertension: Secondary | ICD-10-CM

## 2014-03-28 DIAGNOSIS — E785 Hyperlipidemia, unspecified: Secondary | ICD-10-CM

## 2014-03-28 DIAGNOSIS — I152 Hypertension secondary to endocrine disorders: Secondary | ICD-10-CM | POA: Insufficient documentation

## 2014-03-28 LAB — POCT GLYCOSYLATED HEMOGLOBIN (HGB A1C): HEMOGLOBIN A1C: 8.4

## 2014-03-28 LAB — GLUCOSE, POCT (MANUAL RESULT ENTRY): POC Glucose: 187 mg/dl — AB (ref 70–99)

## 2014-03-28 NOTE — Patient Instructions (Signed)
Diabetes Mellitus and Food It is important for you to manage your blood sugar (glucose) level. Your blood glucose level can be greatly affected by what you eat. Eating healthier foods in the appropriate amounts throughout the day at about the same time each day will help you control your blood glucose level. It can also help slow or prevent worsening of your diabetes mellitus. Healthy eating may even help you improve the level of your blood pressure and reach or maintain a healthy weight.  HOW CAN FOOD AFFECT ME? Carbohydrates Carbohydrates affect your blood glucose level more than any other type of food. Your dietitian will help you determine how many carbohydrates to eat at each meal and teach you how to count carbohydrates. Counting carbohydrates is important to keep your blood glucose at a healthy level, especially if you are using insulin or taking certain medicines for diabetes mellitus. Alcohol Alcohol can cause sudden decreases in blood glucose (hypoglycemia), especially if you use insulin or take certain medicines for diabetes mellitus. Hypoglycemia can be a life-threatening condition. Symptoms of hypoglycemia (sleepiness, dizziness, and disorientation) are similar to symptoms of having too much alcohol.  If your health care provider has given you approval to drink alcohol, do so in moderation and use the following guidelines:  Women should not have more than one drink per day, and men should not have more than two drinks per day. One drink is equal to:  12 oz of beer.  5 oz of wine.  1 oz of hard liquor.  Do not drink on an empty stomach.  Keep yourself hydrated. Have water, diet soda, or unsweetened iced tea.  Regular soda, juice, and other mixers might contain a lot of carbohydrates and should be counted. WHAT FOODS ARE NOT RECOMMENDED? As you make food choices, it is important to remember that all foods are not the same. Some foods have fewer nutrients per serving than other  foods, even though they might have the same number of calories or carbohydrates. It is difficult to get your body what it needs when you eat foods with fewer nutrients. Examples of foods that you should avoid that are high in calories and carbohydrates but low in nutrients include:  Trans fats (most processed foods list trans fats on the Nutrition Facts label).  Regular soda.  Juice.  Candy.  Sweets, such as cake, pie, doughnuts, and cookies.  Fried foods. WHAT FOODS CAN I EAT? Have nutrient-rich foods, which will nourish your body and keep you healthy. The food you should eat also will depend on several factors, including:  The calories you need.  The medicines you take.  Your weight.  Your blood glucose level.  Your blood pressure level.  Your cholesterol level. You also should eat a variety of foods, including:  Protein, such as meat, poultry, fish, tofu, nuts, and seeds (lean animal proteins are best).  Fruits.  Vegetables.  Dairy products, such as milk, cheese, and yogurt (low fat is best).  Breads, grains, pasta, cereal, rice, and beans.  Fats such as olive oil, trans fat-free margarine, canola oil, avocado, and olives. DOES EVERYONE WITH DIABETES MELLITUS HAVE THE SAME MEAL PLAN? Because every person with diabetes mellitus is different, there is not one meal plan that works for everyone. It is very important that you meet with a dietitian who will help you create a meal plan that is just right for you. Document Released: 06/25/2005 Document Revised: 10/03/2013 Document Reviewed: 08/25/2013 ExitCare Patient Information 2015 [employer]. This   information is not intended to replace advice given to you by your health care provider. Make sure you discuss any questions you have with your health care provider. Hypertension Hypertension, commonly called high blood pressure, is when the force of blood pumping through your arteries is too strong. Your arteries are the  blood vessels that carry blood from your heart throughout your body. A blood pressure reading consists of a higher number over a lower number, such as 110/72. The higher number (systolic) is the pressure inside your arteries when your heart pumps. The lower number (diastolic) is the pressure inside your arteries when your heart relaxes. Ideally you want your blood pressure below 120/80. Hypertension forces your heart to work harder to pump blood. Your arteries may become narrow or stiff. Having hypertension puts you at risk for heart disease, stroke, and other problems.  RISK FACTORS Some risk factors for high blood pressure are controllable. Others are not.  Risk factors you cannot control include:   Race. You may be at higher risk if you are African American.  Age. Risk increases with age.  Gender. Men are at higher risk than women before age 49 years. After age 26, women are at higher risk than men. Risk factors you can control include:  Not getting enough exercise or physical activity.  Being overweight.  Getting too much fat, sugar, calories, or salt in your diet.  Drinking too much alcohol. SIGNS AND SYMPTOMS Hypertension does not usually cause signs or symptoms. Extremely high blood pressure (hypertensive crisis) may cause headache, anxiety, shortness of breath, and nosebleed. DIAGNOSIS  To check if you have hypertension, your health care provider will measure your blood pressure while you are seated, with your arm held at the level of your heart. It should be measured at least twice using the same arm. Certain conditions can cause a difference in blood pressure between your right and left arms. A blood pressure reading that is higher than normal on one occasion does not mean that you need treatment. If one blood pressure reading is high, ask your health care provider about having it checked again. TREATMENT  Treating high blood pressure includes making lifestyle changes and possibly  taking medication. Living a healthy lifestyle can help lower high blood pressure. You may need to change some of your habits. Lifestyle changes may include:  Following the DASH diet. This diet is high in fruits, vegetables, and whole grains. It is low in salt, red meat, and added sugars.  Getting at least 2 1/2 hours of brisk physical activity every week.  Losing weight if necessary.  Not smoking.  Limiting alcoholic beverages.  Learning ways to reduce stress. If lifestyle changes are not enough to get your blood pressure under control, your health care provider may prescribe medicine. You may need to take more than one. Work closely with your health care provider to understand the risks and benefits. HOME CARE INSTRUCTIONS  Have your blood pressure rechecked as directed by your health care provider.   Only take medicine as directed by your health care provider. Follow the directions carefully. Blood pressure medicines must be taken as prescribed. The medicine does not work as well when you skip doses. Skipping doses also puts you at risk for problems.   Do not smoke.   Monitor your blood pressure at home as directed by your health care provider. SEEK MEDICAL CARE IF:   You think you are having a reaction to medicines taken.  You have recurrent headaches  or feel dizzy.  You have swelling in your ankles.  You have trouble with your vision. SEEK IMMEDIATE MEDICAL CARE IF:  You develop a severe headache or confusion.  You have unusual weakness, numbness, or feel faint.  You have severe chest or abdominal pain.  You vomit repeatedly.  You have trouble breathing. MAKE SURE YOU:   Understand these instructions.  Will watch your condition.  Will get help right away if you are not doing well or get worse. Document Released: 09/28/2005 Document Revised: 10/03/2013 Document Reviewed: 07/21/2013 [facility] Patient Information 2015 [place], [place]. This information is not  intended to replace advice given to you by your health care provider. Make sure you discuss any questions you have with your health care provider. DASH Diet The DASH diet stands for "Dietary Approaches to Stop Hypertension." It is a healthy eating plan that has been shown to reduce high blood pressure (hypertension) in as little as 14 days, while also possibly providing other significant health benefits. These other health benefits include reducing the risk of breast cancer after menopause and reducing the risk of type 2 diabetes, heart disease, colon cancer, and stroke. Health benefits also include weight loss and slowing kidney failure in patients with chronic kidney disease.  DIET GUIDELINES  Limit salt (sodium). Your diet should contain less than 1500 mg of sodium daily.  Limit refined or processed carbohydrates. Your diet should include mostly whole grains. Desserts and added sugars should be used sparingly.  Include small amounts of heart-healthy fats. These types of fats include nuts, oils, and tub margarine. Limit saturated and trans fats. These fats have been shown to be harmful in the body. CHOOSING FOODS  The following food groups are based on a 2000 calorie diet. See your Registered Dietitian for individual calorie needs. Grains and Grain Products (6 to 8 servings daily)  Eat More Often: Whole-wheat bread, brown rice, whole-grain or wheat pasta, quinoa, popcorn without added fat or salt (air popped).  Eat Less Often: White bread, white pasta, white rice, cornbread. Vegetables (4 to 5 servings daily)  Eat More Often: Fresh, frozen, and canned vegetables. Vegetables may be raw, steamed, roasted, or grilled with a minimal amount of fat.  Eat Less Often/Avoid: Creamed or fried vegetables. Vegetables in a cheese sauce. Fruit (4 to 5 servings daily)  Eat More Often: All fresh, canned (in natural juice), or frozen fruits. Dried fruits without added sugar. One hundred percent fruit juice  ( cup [237 mL] daily).  Eat Less Often: Dried fruits with added sugar. Canned fruit in light or heavy syrup. [employer], Fish, and Poultry (2 servings or less daily. One serving is 3 to 4 oz [85-114 g]).  Eat More Often: Ninety percent or leaner ground beef, tenderloin, sirloin. Round cuts of beef, chicken breast, [place] breast. All fish. Grill, bake, or broil your meat. Nothing should be fried.  Eat Less Often/Avoid: Fatty cuts of meat, [place], or chicken leg, thigh, or wing. Fried cuts of meat or fish. Dairy (2 to 3 servings)  Eat More Often: Low-fat or fat-free milk, low-fat plain or light yogurt, reduced-fat or part-skim cheese.  Eat Less Often/Avoid: Milk (whole, 2%).Whole milk yogurt. Full-fat cheeses. Nuts, Seeds, and Legumes (4 to 5 servings per week)  Eat More Often: All without added salt.  Eat Less Often/Avoid: Salted nuts and seeds, canned beans with added salt. Fats and Sweets (limited)  Eat More Often: Vegetable oils, tub margarines without trans fats, sugar-free gelatin. Mayonnaise and salad dressings.  Eat Less Often/Avoid: Coconut oils, palm oils, butter, stick margarine, cream, half and half, cookies, candy, pie. FOR MORE INFORMATION The Dash Diet Eating Plan: www.dashdiet.org Document Released: 09/17/2011 Document Revised: 12/21/2011 Document Reviewed: 09/17/2011 [facility] Patient Information 2014 [place], [place].

## 2014-03-28 NOTE — Progress Notes (Signed)
Pt here for 3 mnth f/u for diabete, htn and depression Pt last checked blood sugar yesterday- 169 Denies urine freq,adominal pain or blurred vision Need eye referral Taking medication daily. No refills needed today A1c,CBG running

## 2014-03-28 NOTE — Progress Notes (Signed)
Patient ID: JESILYN EASOM, female   DOB: 1956/05/31, 58 y.o.   MRN: [id number]   Patricia [name], is a 58 y.o. female  [id number]  [id number]  DOB - 1956/10/07  Chief Complaint  Patient presents with  . Follow-up  . Hypertension  . Diabetes  . Depression        Subjective:   Patricia [name] is a 58 y.o. female here today for a follow up visit. Patient has history of hypertension, diabetes, depression and morbid obesity here for 3 mnth f/u. Pt last checked blood sugar yesterday- 169. Denies urine freq, adominal pain or blurred vision She has not had diabetic retinopathy screening for dysuria. She takes her medication regularly, she claims compliant with diet and exercise. She does not need refill on her medications today. She has no specific complaint except that she wants to be referred to dentist. Patient has No headache, No chest pain, No abdominal pain - No Nausea, No new weakness tingling or numbness, No Cough - SOB.  Problem  Essential Hypertension, Benign  Dental Caries    ALLERGIES: Allergies  Allergen Reactions  . Sulfa Antibiotics Itching and Rash    PAST MEDICAL HISTORY: Past Medical History  Diagnosis Date  . Diabetes mellitus without complication   . Hypertension   . Obesity     MEDICATIONS AT HOME: Prior to Admission medications   Medication Sig Start Date End Date Taking? Authorizing Provider  amLODipine (NORVASC) 5 MG tablet Take 1 tablet (5 mg total) by mouth daily. 09/14/13  Yes Ripudeep Krystal Eaton, MD  aspirin 81 MG tablet Take 81 mg by mouth daily.   Yes Historical Provider, MD  atorvastatin (LIPITOR) 10 MG tablet Take 1 tablet (10 mg total) by mouth daily. 12/14/13  Yes Deepak Advani, MD  escitalopram (LEXAPRO) 10 MG tablet Take 1 tablet (10 mg total) by mouth daily. 09/14/13  Yes Ripudeep K Rai, MD  FARXIGA 5 MG TABS TAKE 1 TABLET BY MOUTH ONCE DAILY   Yes Angelica Chessman, MD  glipiZIDE (GLUCOTROL) 10 MG tablet Take 2 tablets (20 mg total) by mouth 2 (two)  times daily before a meal. 12/13/13  Yes Deepak Advani, MD  lisinopril-hydrochlorothiazide (PRINZIDE,ZESTORETIC) 10-12.5 MG per tablet Take 1 tablet by mouth daily. 09/14/13  Yes Ripudeep Krystal Eaton, MD  metFORMIN (GLUCOPHAGE) 1000 MG tablet Take 1 tablet (1,000 mg total) by mouth 2 (two) times daily. 12/13/13  Yes Lorayne Marek, MD  Vitamin D, Ergocalciferol, (DRISDOL) 50000 UNITS CAPS capsule Take 1 capsule (50,000 Units total) by mouth every 7 (seven) days. 12/14/13  Yes Lorayne Marek, MD  fluconazole (DIFLUCAN) 150 MG tablet Take 1 tablet (150 mg total) by mouth once. 12/13/13   Lorayne Marek, MD  glucose monitoring kit (FREESTYLE) monitoring kit 1 each by Does not apply route as needed for other. 10/23/13   Angelica Chessman, MD  ibuprofen (ADVIL,MOTRIN) 200 MG tablet Take 200 mg by mouth every 6 (six) hours as needed for pain.    Historical Provider, MD  metroNIDAZOLE (FLAGYL) 500 MG tablet Take 1 tablet (500 mg total) by mouth 3 (three) times daily. 12/13/13   Lorayne Marek, MD     Objective:   Filed Vitals:   03/28/14 1210  BP: 158/91  Pulse: 92  Temp: 98.7 F (37.1 C)  TempSrc: Oral  Resp: 16  Height: _0  (1.651 m)  Weight: 162 lb (73.483 kg)  SpO2: 99%    Exam General appearance : Awake, alert, not in any distress. Speech Clear. Not  toxic looking, morbidly obese HEENT: Atraumatic and Normocephalic, pupils equally reactive to light and accomodation Neck: supple, no JVD. No cervical lymphadenopathy.  Chest:Good air entry bilaterally, no added sounds  CVS: S1 S2 regular, no murmurs.  Abdomen: Bowel sounds present, Non tender and not distended with no gaurding, rigidity or rebound. Extremities: B/L Lower Ext shows no edema, both legs are warm to touch Neurology: Awake alert, and oriented X 3, CN II-XII intact, Non focal Skin:No Rash Wounds:N/A  Data Review Lab Results  Component Value Date   HGBA1C 9.2% 12/13/2013   HGBA1C 10.0 09/14/2013   HGBA1C 9.8 04/18/2013     Assessment & Plan    1. Diabetes  - Glucose (CBG) - HgB A1c - Ambulatory referral to Ophthalmology  2. Essential hypertension, benign. Patient has not taken her medications today  Continue current medications. The DASH diet.  3. Dyslipidemia  Continue Lipitor 10 mg tablet by mouth daily. Low-cholesterol, low-fat diet.  4. Dental caries  - Ambulatory referral to Dentistry    Patient was counseled extensively about nutrition and exercise.   Return in about 3 months (around 06/28/2014), or if symptoms worsen or fail to improve, for Hemoglobin A1C and Follow up, DM, Follow up HTN.  The patient was given clear instructions to go to ER or return to medical center if symptoms don't improve, worsen or new problems develop. The patient verbalized understanding. The patient was told to call to get lab results if they haven't heard anything in the next week.   This note has been created with Surveyor, quantity. Any transcriptional errors are unintentional.    Angelica Chessman, MD, [place], [place], [place] and [place], [place]   03/28/2014, 12:33 PM

## 2014-04-17 ENCOUNTER — Ambulatory Visit: Payer: Self-pay | Admitting: Home Health Services

## 2014-04-17 ENCOUNTER — Ambulatory Visit (INDEPENDENT_AMBULATORY_CARE_PROVIDER_SITE_OTHER): Payer: Self-pay | Admitting: Home Health Services

## 2014-04-17 ENCOUNTER — Encounter: Payer: Self-pay | Admitting: Home Health Services

## 2014-04-17 DIAGNOSIS — E113399 Type 2 diabetes mellitus with moderate nonproliferative diabetic retinopathy without macular edema, unspecified eye: Secondary | ICD-10-CM | POA: Insufficient documentation

## 2014-04-17 DIAGNOSIS — E11311 Type 2 diabetes mellitus with unspecified diabetic retinopathy with macular edema: Secondary | ICD-10-CM | POA: Insufficient documentation

## 2014-04-17 DIAGNOSIS — E119 Type 2 diabetes mellitus without complications: Secondary | ICD-10-CM

## 2014-04-17 DIAGNOSIS — E11339 Type 2 diabetes mellitus with moderate nonproliferative diabetic retinopathy without macular edema: Secondary | ICD-10-CM | POA: Insufficient documentation

## 2014-04-17 NOTE — Progress Notes (Signed)
DIABETES Pt came in to have a retinal scan per diabetic care.   Image was taken and submitted to [facility]DR. [name] for reading.    Results will be available in 1-2 weeks.  Results will be given to PCP for review and to contact patient.  Patricia [name]

## 2014-04-20 ENCOUNTER — Other Ambulatory Visit: Payer: Self-pay | Admitting: Internal Medicine

## 2014-04-20 DIAGNOSIS — E13311 Other specified diabetes mellitus with unspecified diabetic retinopathy with macular edema: Secondary | ICD-10-CM

## 2014-06-19 ENCOUNTER — Other Ambulatory Visit: Payer: Self-pay | Admitting: Internal Medicine

## 2014-06-28 ENCOUNTER — Encounter: Payer: Self-pay | Admitting: Internal Medicine

## 2014-06-28 ENCOUNTER — Ambulatory Visit: Payer: Self-pay | Attending: Internal Medicine | Admitting: Internal Medicine

## 2014-06-28 VITALS — BP 116/76 | HR 82 | Temp 98.3°F | Resp 16 | Ht 65.0 in | Wt 165.0 lb

## 2014-06-28 DIAGNOSIS — I10 Essential (primary) hypertension: Secondary | ICD-10-CM | POA: Insufficient documentation

## 2014-06-28 DIAGNOSIS — Z23 Encounter for immunization: Secondary | ICD-10-CM

## 2014-06-28 DIAGNOSIS — E119 Type 2 diabetes mellitus without complications: Secondary | ICD-10-CM | POA: Insufficient documentation

## 2014-06-28 DIAGNOSIS — R252 Cramp and spasm: Secondary | ICD-10-CM | POA: Insufficient documentation

## 2014-06-28 DIAGNOSIS — Z7982 Long term (current) use of aspirin: Secondary | ICD-10-CM | POA: Insufficient documentation

## 2014-06-28 LAB — POCT GLYCOSYLATED HEMOGLOBIN (HGB A1C): HEMOGLOBIN A1C: 8

## 2014-06-28 LAB — GLUCOSE, POCT (MANUAL RESULT ENTRY): POC Glucose: 158 mg/dl — AB (ref 70–99)

## 2014-06-28 MED ORDER — HYDROCHLOROTHIAZIDE 25 MG PO TABS: 25.0000 mg | ORAL_TABLET | Freq: Every day | ORAL | Status: DC

## 2014-06-28 MED ORDER — CLONIDINE HCL 0.1 MG PO TABS
0.1000 mg | ORAL_TABLET | Freq: Once | ORAL | Status: AC
  Administered 2014-06-28: 0.1 mg via ORAL

## 2014-06-28 NOTE — Patient Instructions (Signed)
DASH Eating Plan °DASH stands for "Dietary Approaches to Stop Hypertension." The DASH eating plan is a healthy eating plan that has been shown to reduce high blood pressure (hypertension). Additional health benefits may include reducing the risk of type 2 diabetes mellitus, heart disease, and stroke. The DASH eating plan may also help with weight loss. °WHAT DO I NEED TO KNOW ABOUT THE DASH EATING PLAN? °For the DASH eating plan, you will follow these general guidelines: °· Choose foods with a percent daily value for sodium of less than 5% (as listed on the food label). °· Use salt-free seasonings or herbs instead of table salt or sea salt. °· Check with your health care provider or pharmacist before using salt substitutes. °· Eat lower-sodium products, often labeled as "lower sodium" or "no salt added." °· Eat fresh foods. °· Eat more vegetables, fruits, and low-fat dairy products. °· Choose whole grains. Look for the word "whole" as the first word in the ingredient list. °· Choose fish and skinless chicken or turkey more often than red meat. Limit fish, poultry, and meat to 6 oz (170 g) each day. °· Limit sweets, desserts, sugars, and sugary drinks. °· Choose heart-healthy fats. °· Limit cheese to 1 oz (28 g) per day. °· Eat more home-cooked food and less restaurant, buffet, and fast food. °· Limit fried foods. °· Cook foods using methods other than frying. °· Limit canned vegetables. If you do use them, rinse them well to decrease the sodium. °· When eating at a restaurant, ask that your food be prepared with less salt, or no salt if possible. °WHAT FOODS CAN I EAT? °Seek help from a dietitian for individual calorie needs. °Grains °Whole grain or whole wheat bread. Brown rice. Whole grain or whole wheat pasta. Quinoa, bulgur, and whole grain cereals. Low-sodium cereals. Corn or whole wheat flour tortillas. Whole grain cornbread. Whole grain crackers. Low-sodium crackers. °Vegetables °Fresh or frozen vegetables  (raw, steamed, roasted, or grilled). Low-sodium or reduced-sodium tomato and vegetable juices. Low-sodium or reduced-sodium tomato sauce and paste. Low-sodium or reduced-sodium canned vegetables.  °Fruits °All fresh, canned (in natural juice), or frozen fruits. °Meat and Other Protein Products °Ground beef (85% or leaner), grass-fed beef, or beef trimmed of fat. Skinless chicken or turkey. Ground chicken or turkey. Pork trimmed of fat. All fish and seafood. Eggs. Dried beans, peas, or lentils. Unsalted nuts and seeds. Unsalted canned beans. °Dairy °Low-fat dairy products, such as skim or 1% milk, 2% or reduced-fat cheeses, low-fat ricotta or cottage cheese, or plain low-fat yogurt. Low-sodium or reduced-sodium cheeses. °Fats and Oils °Tub margarines without trans fats. Light or reduced-fat mayonnaise and salad dressings (reduced sodium). Avocado. Safflower, olive, or canola oils. Natural peanut or almond butter. °Other °Unsalted popcorn and pretzels. °The items listed above may not be a complete list of recommended foods or beverages. Contact your dietitian for more options. °WHAT FOODS ARE NOT RECOMMENDED? °Grains °White bread. White pasta. White rice. Refined cornbread. Bagels and croissants. Crackers that contain trans fat. °Vegetables °Creamed or fried vegetables. Vegetables in a cheese sauce. Regular canned vegetables. Regular canned tomato sauce and paste. Regular tomato and vegetable juices. °Fruits °Dried fruits. Canned fruit in light or heavy syrup. Fruit juice. °Meat and Other Protein Products °Fatty cuts of meat. Ribs, chicken wings, bacon, sausage, bologna, salami, chitterlings, fatback, hot dogs, bratwurst, and packaged luncheon meats. Salted nuts and seeds. Canned beans with salt. °Dairy °Whole or 2% milk, cream, half-and-half, and cream cheese. Whole-fat or sweetened yogurt. Full-fat   cheeses or blue cheese. Nondairy creamers and whipped toppings. Processed cheese, cheese spreads, or cheese  curds. °Condiments °Onion and garlic salt, seasoned salt, table salt, and sea salt. Canned and packaged gravies. Worcestershire sauce. Tartar sauce. Barbecue sauce. Teriyaki sauce. Soy sauce, including reduced sodium. Steak sauce. Fish sauce. Oyster sauce. Cocktail sauce. Horseradish. Ketchup and mustard. Meat flavorings and tenderizers. Bouillon cubes. Hot sauce. Tabasco sauce. Marinades. Taco seasonings. Relishes. °Fats and Oils °Butter, stick margarine, lard, shortening, ghee, and bacon fat. Coconut, palm kernel, or palm oils. Regular salad dressings. °Other °Pickles and olives. Salted popcorn and pretzels. °The items listed above may not be a complete list of foods and beverages to avoid. Contact your dietitian for more information. °WHERE CAN I FIND MORE INFORMATION? °National Heart, Lung, and [employer] Institute: www.nhlbi.nih.gov/health/health-topics/topics/dash/ °Document Released: 09/17/2011 Document Revised: 02/12/2014 Document Reviewed: 08/02/2013 °ExitCare® Patient Information ©2015 [employer]. This information is not intended to replace advice given to you by your health care provider. Make sure you discuss any questions you have with your health care provider. ° °

## 2014-06-28 NOTE — Progress Notes (Signed)
Pt is here following up on her HTN. Pt has questions about her glipizide. Pt states that she is having cramps in her legs.

## 2014-06-30 NOTE — Progress Notes (Signed)
Patient ID: Patricia [name], female   DOB: [birth year], 58 y.o.   MRN: [id number]   Patricia [name], is a 58 y.o. female  YIA:[id number]  [id number]  DOB - [birth year]  Chief Complaint  Patient presents with  . Follow-up        Subjective:   Patricia [name] is a 58 y.o. female here today for a follow up visit. Patient has history of hypertension, diabetes mellitus, depression and morbid obesity here today for her routine follow-up. Major complaint today is cramps in lower limbs ongoing for months. She also has questions about dosage of glipizide and frequency of administration. He claims compliant with medications, no side effects reported. She has no hypoglycemic episode. Last hemoglobin A1c was 8.4%. Patient has No headache, No chest pain, No abdominal pain - No Nausea, No new weakness tingling or numbness, No Cough - SOB.  No problems updated.  ALLERGIES: Allergies  Allergen Reactions  . Sulfa Antibiotics Itching and Rash    PAST MEDICAL HISTORY: Past Medical History  Diagnosis Date  . Diabetes mellitus without complication   . Hypertension   . Obesity     MEDICATIONS AT HOME: Prior to Admission medications   Medication Sig Start Date End Date Taking? Authorizing Provider  aspirin 81 MG tablet Take 81 mg by mouth daily.   Yes Historical Provider, MD  atorvastatin (LIPITOR) 10 MG tablet Take 1 tablet (10 mg total) by mouth daily. 12/14/13  Yes Deepak Advani, MD  escitalopram (LEXAPRO) 10 MG tablet Take 1 tablet (10 mg total) by mouth daily. 09/14/13  Yes Ripudeep K Rai, MD  FARXIGA 5 MG TABS TAKE 1 TABLET BY MOUTH ONCE DAILY   Yes Blakelynn Scheeler E Gavon Majano, MD  glipiZIDE (GLUCOTROL) 10 MG tablet Take 2 tablets (20 mg total) by mouth 2 (two) times daily before a meal. 12/13/13  Yes Deepak Advani, MD  glucose monitoring kit (FREESTYLE) monitoring kit 1 each by Does not apply route as needed for other. 10/23/13  Yes Tresa Garter, MD  ibuprofen (ADVIL,MOTRIN) 200 MG tablet Take 200 mg by  mouth every 6 (six) hours as needed for pain.   Yes Historical Provider, MD  metFORMIN (GLUCOPHAGE) 1000 MG tablet Take 1 tablet (1,000 mg total) by mouth 2 (two) times daily. 12/13/13  Yes Lorayne Marek, MD  Vitamin D, Ergocalciferol, (DRISDOL) 50000 UNITS CAPS capsule Take 1 capsule (50,000 Units total) by mouth every 7 (seven) days. 12/14/13  Yes Lorayne Marek, MD  fluconazole (DIFLUCAN) 150 MG tablet Take 1 tablet (150 mg total) by mouth once. 12/13/13   Lorayne Marek, MD  hydrochlorothiazide (HYDRODIURIL) 25 MG tablet Take 1 tablet (25 mg total) by mouth daily. 06/28/14   Tresa Garter, MD  metroNIDAZOLE (FLAGYL) 500 MG tablet Take 1 tablet (500 mg total) by mouth 3 (three) times daily. 12/13/13   Lorayne Marek, MD     Objective:   Filed Vitals:   06/28/14 0930 06/28/14 1100  BP: 165/108 116/76  Pulse: 82   Temp: 98.3 F (36.8 C)   TempSrc: Oral   Resp: 16   Height: 5' 5"  (1.651 m)   Weight: 165 lb (74.844 kg)   SpO2: 99%     Exam General appearance : Awake, alert, not in any distress. Speech Clear. Not toxic looking HEENT: Atraumatic and Normocephalic, pupils equally reactive to light and accomodation Neck: supple, no JVD. No cervical lymphadenopathy.  Chest:Good air entry bilaterally, no added sounds  CVS: S1 S2 regular, no murmurs.  Abdomen: Bowel sounds  present, Non tender and not distended with no gaurding, rigidity or rebound. Extremities: B/L Lower Ext shows no edema, both legs are warm to touch Neurology: Awake alert, and oriented X 3, CN II-XII intact, Non focal Skin:No Rash Wounds:N/A  Data Review Lab Results  Component Value Date   HGBA1C 8.0 06/28/2014   HGBA1C 8.4 03/28/2014   HGBA1C 9.2% 12/13/2013     Assessment & Plan   1. Type 2 diabetes mellitus without complication  - Glucose (CBG) - HgB A1c has gone down to 8.0% today from 8.4% 3 months ago.  Aim for 2-3 Carb Choices per meal (30-45 grams) +/- 1 either way  Aim for 0-15 Carbs per snack if hungry    Include protein in moderation with your meals and snacks  Consider reading food labels for Total Carbohydrate and Fat Grams of foods  Consider checking BG at alternate times per day  Continue taking medication as directed Fruit Punch - find one with no sugar  Measure and decrease portions of carbohydrate foods  Make your plate and don't go back for seconds   2. Essential hypertension  - cloNIDine (CATAPRES) tablet 0.1 mg; Take 1 tablet (0.1 mg total) by mouth once.  - hydrochlorothiazide (HYDRODIURIL) 25 MG tablet; Take 1 tablet (25 mg total) by mouth daily.  Dispense: 90 tablet; Refill: 3  - We have discussed target BP range and blood pressure goal - I have advised patient to check BP regularly and to call us back or report to clinic if the numbers are consistently higher than 140/90  - We discussed the importance of compliance with medical therapy and DASH diet recommended, consequences of uncontrolled hypertension discussed.    Return in about 2 weeks (around 07/12/2014), or if symptoms worsen or fail to improve, for BP Check, Nurse Visit.  The patient was given clear instructions to go to ER or return to medical center if symptoms don't improve, worsen or new problems develop. The patient verbalized understanding. The patient was told to call to get lab results if they haven't heard anything in the next week.   This note has been created with Surveyor, quantity. Any transcriptional errors are unintentional.    Angelica Chessman, MD, [place], [place], [place] [facility] [place], [place]

## 2014-07-09 ENCOUNTER — Other Ambulatory Visit: Payer: Self-pay | Admitting: Internal Medicine

## 2014-07-13 ENCOUNTER — Other Ambulatory Visit: Payer: Self-pay

## 2014-07-13 ENCOUNTER — Ambulatory Visit: Payer: Self-pay | Attending: Internal Medicine

## 2014-07-13 DIAGNOSIS — E089 Diabetes mellitus due to underlying condition without complications: Secondary | ICD-10-CM

## 2014-07-13 MED ORDER — GLIPIZIDE 10 MG PO TABS: 20.0000 mg | ORAL_TABLET | Freq: Two times a day (BID) | ORAL | Status: DC

## 2014-07-13 NOTE — Patient Instructions (Addendum)
Continue taking prescribed blood pressure medication and follow DASH diet to control HypertensionDASH Eating Plan DASH stands for "Dietary Approaches to Stop Hypertension." The DASH eating plan is a healthy eating plan that has been shown to reduce high blood pressure (hypertension). Additional health benefits may include reducing the risk of type 2 diabetes mellitus, heart disease, and stroke. The DASH eating plan may also help with weight loss. WHAT DO I NEED TO KNOW ABOUT THE DASH EATING PLAN? For the DASH eating plan, you will follow these general guidelines:  Choose foods with a percent daily value for sodium of less than 5% (as listed on the food label).  Use salt-free seasonings or herbs instead of table salt or sea salt.  Check with your health care provider or pharmacist before using salt substitutes.  Eat lower-sodium products, often labeled as "lower sodium" or "no salt added."  Eat fresh foods.  Eat more vegetables, fruits, and low-fat dairy products.  Choose whole grains. Look for the word "whole" as the first word in the ingredient list.  Choose fish and skinless chicken or [place] more often than red meat. Limit fish, poultry, and meat to 6 oz (170 g) each day.  Limit sweets, desserts, sugars, and sugary drinks.  Choose heart-healthy fats.  Limit cheese to 1 oz (28 g) per day.  Eat more home-cooked food and less restaurant, buffet, and fast food.  Limit fried foods.  Cook foods using methods other than frying.  Limit canned vegetables. If you do use them, rinse them well to decrease the sodium.  When eating at a restaurant, ask that your food be prepared with less salt, or no salt if possible. WHAT FOODS CAN I EAT? Seek help from a dietitian for individual calorie needs. Grains Whole grain or whole wheat bread. Brown rice. Whole grain or whole wheat pasta. Quinoa, bulgur, and whole grain cereals. Low-sodium cereals. Corn or whole wheat flour tortillas. Whole grain  cornbread. Whole grain crackers. Low-sodium crackers. Vegetables Fresh or frozen vegetables (raw, steamed, roasted, or grilled). Low-sodium or reduced-sodium tomato and vegetable juices. Low-sodium or reduced-sodium tomato sauce and paste. Low-sodium or reduced-sodium canned vegetables.  Fruits All fresh, canned (in natural juice), or frozen fruits. Meat and Other Protein Products Ground beef (85% or leaner), grass-fed beef, or beef trimmed of fat. Skinless chicken or [place]. Ground chicken or [place]. Pork trimmed of fat. All fish and seafood. Eggs. Dried beans, peas, or lentils. Unsalted nuts and seeds. Unsalted canned beans. Dairy Low-fat dairy products, such as skim or 1% milk, 2% or reduced-fat cheeses, low-fat ricotta or cottage cheese, or plain low-fat yogurt. Low-sodium or reduced-sodium cheeses. Fats and Oils Tub margarines without trans fats. Light or reduced-fat mayonnaise and salad dressings (reduced sodium). Avocado. Safflower, olive, or canola oils. Natural peanut or almond butter. Other Unsalted popcorn and pretzels. The items listed above may not be a complete list of recommended foods or beverages. Contact your dietitian for more options. WHAT FOODS ARE NOT RECOMMENDED? Grains White bread. White pasta. White rice. Refined cornbread. Bagels and croissants. Crackers that contain trans fat. Vegetables Creamed or fried vegetables. Vegetables in a cheese sauce. Regular canned vegetables. Regular canned tomato sauce and paste. Regular tomato and vegetable juices. Fruits Dried fruits. Canned fruit in light or heavy syrup. Fruit juice. Meat and Other Protein Products Fatty cuts of meat. Ribs, chicken wings, bacon, sausage, bologna, salami, chitterlings, fatback, hot dogs, bratwurst, and packaged luncheon meats. Salted nuts and seeds. Canned beans with salt. Dairy Whole or  2% milk, cream, half-and-half, and cream cheese. Whole-fat or sweetened yogurt. Full-fat cheeses or blue cheese.  Nondairy creamers and whipped toppings. Processed cheese, cheese spreads, or cheese curds. Condiments Onion and garlic salt, seasoned salt, table salt, and sea salt. Canned and packaged gravies. Worcestershire sauce. Tartar sauce. Barbecue sauce. Teriyaki sauce. Soy sauce, including reduced sodium. Steak sauce. Fish sauce. Oyster sauce. Cocktail sauce. Horseradish. Ketchup and mustard. Meat flavorings and tenderizers. Bouillon cubes. Hot sauce. Tabasco sauce. Marinades. Taco seasonings. Relishes. Fats and Oils Butter, stick margarine, lard, shortening, ghee, and bacon fat. Coconut, palm kernel, or palm oils. Regular salad dressings. Other Pickles and olives. Salted popcorn and pretzels. The items listed above may not be a complete list of foods and beverages to avoid. Contact your dietitian for more information. WHERE CAN I FIND MORE INFORMATION? [employer], [employer], [employer]: travelstabloid.com Document Released: 09/17/2011 Document Revised: 02/12/2014 Document Reviewed: 08/02/2013 [facility] Patient Information 2015 [place], [place]. This information is not intended to replace advice given to you by your health care provider. Make sure you discuss any questions you have with your health care provider.

## 2014-08-23 ENCOUNTER — Other Ambulatory Visit: Payer: Self-pay | Admitting: Internal Medicine

## 2014-08-24 ENCOUNTER — Other Ambulatory Visit: Payer: Self-pay | Admitting: Internal Medicine

## 2014-09-17 ENCOUNTER — Ambulatory Visit: Payer: Self-pay

## 2014-09-21 ENCOUNTER — Ambulatory Visit: Payer: Self-pay | Attending: Internal Medicine | Admitting: *Deleted

## 2014-09-21 DIAGNOSIS — Z139 Encounter for screening, unspecified: Secondary | ICD-10-CM

## 2014-09-21 NOTE — Progress Notes (Signed)
Patient presented for pneumovax 23, however, patient received prevnar 13 07/13/14 and is not due for  pneumovax 23 till 01/12/15

## 2014-10-01 ENCOUNTER — Ambulatory Visit: Payer: Self-pay | Attending: Internal Medicine | Admitting: Internal Medicine

## 2014-10-01 ENCOUNTER — Other Ambulatory Visit: Payer: Self-pay | Admitting: Internal Medicine

## 2014-10-01 ENCOUNTER — Encounter: Payer: Self-pay | Admitting: Internal Medicine

## 2014-10-01 VITALS — BP 136/85 | HR 87 | Temp 98.3°F | Resp 16 | Ht 64.0 in | Wt 166.0 lb

## 2014-10-01 DIAGNOSIS — Z79899 Other long term (current) drug therapy: Secondary | ICD-10-CM | POA: Insufficient documentation

## 2014-10-01 DIAGNOSIS — F329 Major depressive disorder, single episode, unspecified: Secondary | ICD-10-CM | POA: Insufficient documentation

## 2014-10-01 DIAGNOSIS — K029 Dental caries, unspecified: Secondary | ICD-10-CM | POA: Insufficient documentation

## 2014-10-01 DIAGNOSIS — Z7982 Long term (current) use of aspirin: Secondary | ICD-10-CM | POA: Insufficient documentation

## 2014-10-01 DIAGNOSIS — E785 Hyperlipidemia, unspecified: Secondary | ICD-10-CM | POA: Insufficient documentation

## 2014-10-01 DIAGNOSIS — Z1211 Encounter for screening for malignant neoplasm of colon: Secondary | ICD-10-CM | POA: Insufficient documentation

## 2014-10-01 DIAGNOSIS — I10 Essential (primary) hypertension: Secondary | ICD-10-CM | POA: Insufficient documentation

## 2014-10-01 DIAGNOSIS — E669 Obesity, unspecified: Secondary | ICD-10-CM | POA: Insufficient documentation

## 2014-10-01 DIAGNOSIS — E119 Type 2 diabetes mellitus without complications: Secondary | ICD-10-CM | POA: Insufficient documentation

## 2014-10-01 LAB — GLUCOSE, POCT (MANUAL RESULT ENTRY): POC GLUCOSE: 180 mg/dL — AB (ref 70–99)

## 2014-10-01 LAB — POCT GLYCOSYLATED HEMOGLOBIN (HGB A1C): HEMOGLOBIN A1C: 8.7

## 2014-10-01 MED ORDER — ATORVASTATIN CALCIUM 10 MG PO TABS: 10.0000 mg | ORAL_TABLET | Freq: Every day | ORAL | Status: DC

## 2014-10-01 NOTE — Progress Notes (Signed)
Pt is here following up on her HTN and diabetes. Pt states that she is compliant w/ her medications regimen.

## 2014-10-01 NOTE — Patient Instructions (Signed)
Dyslipidemia Dyslipidemia is an imbalance of the lipids in your blood. Lipids are waxy, fat-like proteins that your body needs in small amounts. Dyslipidemia often involves the lipids cholesterol or triglycerides. Common forms of dyslipidemia are:  High levels of bad cholesterol (LDL cholesterol). LDL cholesterol is the type of cholesterol that causes heart disease.  Low levels of good cholesterol (HDL cholesterol). HDL cholesterol is the type of cholesterol that helps protect against heart disease.  High levels of triglycerides. Triglycerides are a fatty substance in the blood linked to a buildup of plaque on your arteries. RISK FACTORS  Increased age.  Having a family history of high cholesterol.  Certain medicines, including birth control pills, diuretics, beta-blockers, and some medicines for depression.  Smoking.  Eating a high-fat diet.  Being overweight.  Medical conditions such as diabetes, polycystic ovary syndrome, pregnancy, kidney disease, and hypothyroidism.  Lack of regular exercise. SIGNS AND SYMPTOMS There are no signs or symptoms with dyslipidemia.  DIAGNOSIS  A simple blood test called a fasting blood test can be done to determine your level of:  Total cholesterol. This is the combined number of LDL cholesterol and HDL cholesterol. A healthy number is lower than 200.  LDL cholesterol. The goal number for LDL cholesterol is different for each person depending on risk factors. Ask your health care provider what your LDL cholesterol number should be.  HDL cholesterol. A healthy level of HDL cholesterol is 60 or higher. A number lower than 40 for men or 50 for women is a danger sign.  Triglycerides. A healthy triglyceride number is less than 150. TREATMENT  Dyslipidemia is a treatable condition. Your health care provider will advise you on what type of treatment is best based on your age, your test results, and current guidelines. Treatment may include:   Dietary  changes. A dietitian can help you create a meal plan. You may need to:  Eat more foods that contain omega-3s, such as salmon and other fish.  Replace saturated fats and trans fats in your diet with healthy fats such as nuts, seeds, avocados, olive oil, and canola oil.  Regular exercise. This can help lower your LDL cholesterol, raise your HDL cholesterol, and help with weight management. Check with your health care provider before beginning an exercise program. Most people should participate in 30 minutes of brisk exercise 5 days a week.  Quitting smoking.  Medicines to lower LDL cholesterol and triglycerides. Your health care provider will monitor your lipid levels with regular blood tests. HOME CARE INSTRUCTIONS  Eat a healthy diet. Follow any diet instructions if they were given to you by your health care provider.  Maintain a healthy weight.  Exercise regularly based on the recommendations of your health care provider.  Do not use any tobacco products, including cigarettes, chewing tobacco, or electronic cigarettes.  Take medicines only as directed by your health care provider.  Keep all follow-up visits as directed by your health care provider. SEEK MEDICAL CARE IF: You are having possible side effects from your medicines. Document Released: 10/03/2013 Document Revised: 02/12/2014 Document Reviewed: 10/03/2013 [facility] Patient Information 2015 [place], [place]. This information is not intended to replace advice given to you by your health care provider. Make sure you discuss any questions you have with your health care provider. DASH Eating Plan DASH stands for "Dietary Approaches to Stop Hypertension." The DASH eating plan is a healthy eating plan that has been shown to reduce high blood pressure (hypertension). Additional health benefits may include reducing  the risk of type 2 diabetes mellitus, heart disease, and stroke. The DASH eating plan may also help with weight loss. WHAT  DO I NEED TO KNOW ABOUT THE DASH EATING PLAN? For the DASH eating plan, you will follow these general guidelines:  Choose foods with a percent daily value for sodium of less than 5% (as listed on the food label).  Use salt-free seasonings or herbs instead of table salt or sea salt.  Check with your health care provider or pharmacist before using salt substitutes.  Eat lower-sodium products, often labeled as "lower sodium" or "no salt added."  Eat fresh foods.  Eat more vegetables, fruits, and low-fat dairy products.  Choose whole grains. Look for the word "whole" as the first word in the ingredient list.  Choose fish and skinless chicken or [place] more often than red meat. Limit fish, poultry, and meat to 6 oz (170 g) each day.  Limit sweets, desserts, sugars, and sugary drinks.  Choose heart-healthy fats.  Limit cheese to 1 oz (28 g) per day.  Eat more home-cooked food and less restaurant, buffet, and fast food.  Limit fried foods.  Cook foods using methods other than frying.  Limit canned vegetables. If you do use them, rinse them well to decrease the sodium.  When eating at a restaurant, ask that your food be prepared with less salt, or no salt if possible. WHAT FOODS CAN I EAT? Seek help from a dietitian for individual calorie needs. Grains Whole grain or whole wheat bread. Brown rice. Whole grain or whole wheat pasta. Quinoa, bulgur, and whole grain cereals. Low-sodium cereals. Corn or whole wheat flour tortillas. Whole grain cornbread. Whole grain crackers. Low-sodium crackers. Vegetables Fresh or frozen vegetables (raw, steamed, roasted, or grilled). Low-sodium or reduced-sodium tomato and vegetable juices. Low-sodium or reduced-sodium tomato sauce and paste. Low-sodium or reduced-sodium canned vegetables.  Fruits All fresh, canned (in natural juice), or frozen fruits. Meat and Other Protein Products Ground beef (85% or leaner), grass-fed beef, or beef trimmed of  fat. Skinless chicken or [place]. Ground chicken or [place]. Pork trimmed of fat. All fish and seafood. Eggs. Dried beans, peas, or lentils. Unsalted nuts and seeds. Unsalted canned beans. Dairy Low-fat dairy products, such as skim or 1% milk, 2% or reduced-fat cheeses, low-fat ricotta or cottage cheese, or plain low-fat yogurt. Low-sodium or reduced-sodium cheeses. Fats and Oils Tub margarines without trans fats. Light or reduced-fat mayonnaise and salad dressings (reduced sodium). Avocado. Safflower, olive, or canola oils. Natural peanut or almond butter. Other Unsalted popcorn and pretzels. The items listed above may not be a complete list of recommended foods or beverages. Contact your dietitian for more options. WHAT FOODS ARE NOT RECOMMENDED? Grains White bread. White pasta. White rice. Refined cornbread. Bagels and croissants. Crackers that contain trans fat. Vegetables Creamed or fried vegetables. Vegetables in a cheese sauce. Regular canned vegetables. Regular canned tomato sauce and paste. Regular tomato and vegetable juices. Fruits Dried fruits. Canned fruit in light or heavy syrup. Fruit juice. Meat and Other Protein Products Fatty cuts of meat. Ribs, chicken wings, bacon, sausage, bologna, salami, chitterlings, fatback, hot dogs, bratwurst, and packaged luncheon meats. Salted nuts and seeds. Canned beans with salt. Dairy Whole or 2% milk, cream, half-and-half, and cream cheese. Whole-fat or sweetened yogurt. Full-fat cheeses or blue cheese. Nondairy creamers and whipped toppings. Processed cheese, cheese spreads, or cheese curds. Condiments Onion and garlic salt, seasoned salt, table salt, and sea salt. Canned and packaged gravies. Worcestershire sauce. Tartar  sauce. Barbecue sauce. Teriyaki sauce. Soy sauce, including reduced sodium. Steak sauce. Fish sauce. Oyster sauce. Cocktail sauce. Horseradish. Ketchup and mustard. Meat flavorings and tenderizers. Bouillon cubes. Hot sauce.  Tabasco sauce. Marinades. Taco seasonings. Relishes. Fats and Oils Butter, stick margarine, lard, shortening, ghee, and bacon fat. Coconut, palm kernel, or palm oils. Regular salad dressings. Other Pickles and olives. Salted popcorn and pretzels. The items listed above may not be a complete list of foods and beverages to avoid. Contact your dietitian for more information. WHERE CAN I FIND MORE INFORMATION? National Heart, Lung, and [employer] Institute: travelstabloid.com Document Released: 09/17/2011 Document Revised: 02/12/2014 Document Reviewed: 08/02/2013 [facility] Patient Information 2015 [place], [place]. This information is not intended to replace advice given to you by your health care provider. Make sure you discuss any questions you have with your health care provider. Hypertension Hypertension, commonly called high blood pressure, is when the force of blood pumping through your arteries is too strong. Your arteries are the blood vessels that carry blood from your heart throughout your body. A blood pressure reading consists of a higher number over a lower number, such as 110/72. The higher number (systolic) is the pressure inside your arteries when your heart pumps. The lower number (diastolic) is the pressure inside your arteries when your heart relaxes. Ideally you want your blood pressure below 120/80. Hypertension forces your heart to work harder to pump blood. Your arteries may become narrow or stiff. Having hypertension puts you at risk for heart disease, stroke, and other problems.  RISK FACTORS Some risk factors for high blood pressure are controllable. Others are not.  Risk factors you cannot control include:   Race. You may be at higher risk if you are African American.  Age. Risk increases with age.  Gender. Men are at higher risk than women before age 1 years. After age 49, women are at higher risk than men. Risk factors you can control  include:  Not getting enough exercise or physical activity.  Being overweight.  Getting too much fat, sugar, calories, or salt in your diet.  Drinking too much alcohol. SIGNS AND SYMPTOMS Hypertension does not usually cause signs or symptoms. Extremely high blood pressure (hypertensive crisis) may cause headache, anxiety, shortness of breath, and nosebleed. DIAGNOSIS  To check if you have hypertension, your health care provider will measure your blood pressure while you are seated, with your arm held at the level of your heart. It should be measured at least twice using the same arm. Certain conditions can cause a difference in blood pressure between your right and left arms. A blood pressure reading that is higher than normal on one occasion does not mean that you need treatment. If one blood pressure reading is high, ask your health care provider about having it checked again. TREATMENT  Treating high blood pressure includes making lifestyle changes and possibly taking medicine. Living a healthy lifestyle can help lower high blood pressure. You may need to change some of your habits. Lifestyle changes may include:  Following the DASH diet. This diet is high in fruits, vegetables, and whole grains. It is low in salt, red meat, and added sugars.  Getting at least 2 hours of brisk physical activity every week.  Losing weight if necessary.  Not smoking.  Limiting alcoholic beverages.  Learning ways to reduce stress. If lifestyle changes are not enough to get your blood pressure under control, your health care provider may prescribe medicine. You may need to take more than  one. Work closely with your health care provider to understand the risks and benefits. HOME CARE INSTRUCTIONS  Have your blood pressure rechecked as directed by your health care provider.   Take medicines only as directed by your health care provider. Follow the directions carefully. Blood pressure medicines must  be taken as prescribed. The medicine does not work as well when you skip doses. Skipping doses also puts you at risk for problems.   Do not smoke.   Monitor your blood pressure at home as directed by your health care provider. SEEK MEDICAL CARE IF:   You think you are having a reaction to medicines taken.  You have recurrent headaches or feel dizzy.  You have swelling in your ankles.  You have trouble with your vision. SEEK IMMEDIATE MEDICAL CARE IF:  You develop a severe headache or confusion.  You have unusual weakness, numbness, or feel faint.  You have severe chest or abdominal pain.  You vomit repeatedly.  You have trouble breathing. MAKE SURE YOU:   Understand these instructions.  Will watch your condition.  Will get help right away if you are not doing well or get worse. Document Released: 09/28/2005 Document Revised: 02/12/2014 Document Reviewed: 07/21/2013 [facility] Patient Information 2015 [place], [place]. This information is not intended to replace advice given to you by your health care provider. Make sure you discuss any questions you have with your health care provider. Diabetes and Exercise Exercising regularly is important. It is not just about losing weight. It has many health benefits, such as:  Improving your overall fitness, flexibility, and endurance.  Increasing your bone density.  Helping with weight control.  Decreasing your body fat.  Increasing your muscle strength.  Reducing stress and tension.  Improving your overall health. People with diabetes who exercise gain additional benefits because exercise:  Reduces appetite.  Improves the body's use of blood sugar (glucose).  Helps lower or control blood glucose.  Decreases blood pressure.  Helps control blood lipids (such as cholesterol and triglycerides).  Improves the body's use of the hormone insulin by:  Increasing the body's insulin sensitivity.  Reducing the body's  insulin needs.  Decreases the risk for heart disease because exercising:  Lowers cholesterol and triglycerides levels.  Increases the levels of good cholesterol (such as high-density lipoproteins [HDL]) in the body.  Lowers blood glucose levels. YOUR ACTIVITY PLAN  Choose an activity that you enjoy and set realistic goals. Your health care provider or diabetes educator can help you make an activity plan that works for you. Exercise regularly as directed by your health care provider. This includes:  Performing resistance training twice a week such as push-ups, sit-ups, lifting weights, or using resistance bands.  Performing 150 minutes of cardio exercises each week such as walking, running, or playing sports.  Staying active and spending no more than 90 minutes at one time being inactive. Even short bursts of exercise are good for you. Three 10-minute sessions spread throughout the day are just as beneficial as a single 30-minute session. Some exercise ideas include:  Taking the dog for a walk.  Taking the stairs instead of the elevator.  Dancing to your favorite song.  Doing an exercise video.  Doing your favorite exercise with a friend. RECOMMENDATIONS FOR EXERCISING WITH TYPE 1 OR TYPE 2 DIABETES   Check your blood glucose before exercising. If blood glucose levels are greater than 240 mg/dL, check for urine ketones. Do not exercise if ketones are present.  Avoid injecting insulin  into areas of the body that are going to be exercised. For example, avoid injecting insulin into:  The arms when playing tennis.  The legs when jogging.  Keep a record of:  Food intake before and after you exercise.  Expected peak times of insulin action.  Blood glucose levels before and after you exercise.  The type and amount of exercise you have done.  Review your records with your health care provider. Your health care provider will help you to develop guidelines for adjusting food  intake and insulin amounts before and after exercising.  If you take insulin or oral hypoglycemic agents, watch for signs and symptoms of hypoglycemia. They include:  Dizziness.  Shaking.  Sweating.  Chills.  Confusion.  Drink plenty of water while you exercise to prevent dehydration or heat stroke. Body water is lost during exercise and must be replaced.  Talk to your health care provider before starting an exercise program to make sure it is safe for you. Remember, almost any type of activity is better than none. Document Released: 12/19/2003 Document Revised: 02/12/2014 Document Reviewed: 03/07/2013 [facility] Patient Information 2015 [place], [place]. This information is not intended to replace advice given to you by your health care provider. Make sure you discuss any questions you have with your health care provider. Basic Carbohydrate Counting for Diabetes Mellitus Carbohydrate counting is a method for keeping track of the amount of carbohydrates you eat. Eating carbohydrates naturally increases the level of sugar (glucose) in your blood, so it is important for you to know the amount that is okay for you to have in every meal. Carbohydrate counting helps keep the level of glucose in your blood within normal limits. The amount of carbohydrates allowed is different for every person. A dietitian can help you calculate the amount that is right for you. Once you know the amount of carbohydrates you can have, you can count the carbohydrates in the foods you want to eat. Carbohydrates are found in the following foods:  Grains, such as breads and cereals.  Dried beans and soy products.  Starchy vegetables, such as potatoes, peas, and corn.  Fruit and fruit juices.  Milk and yogurt.  Sweets and snack foods, such as cake, cookies, candy, chips, soft drinks, and fruit drinks. CARBOHYDRATE COUNTING There are two ways to count the carbohydrates in your food. You can use either of the  methods or a combination of both. Reading the "Nutrition Facts" on [place] The "Nutrition Facts" is an area that is included on the labels of almost all packaged food and beverages in the [place]. It includes the serving size of that food or beverage and information about the nutrients in each serving of the food, including the grams (g) of carbohydrate per serving.  Decide the number of servings of this food or beverage that you will be able to eat or drink. Multiply that number of servings by the number of grams of carbohydrate that is listed on the label for that serving. The total will be the amount of carbohydrates you will be having when you eat or drink this food or beverage. Learning Standard Serving Sizes of Food When you eat food that is not packaged or does not include "Nutrition Facts" on the label, you need to measure the servings in order to count the amount of carbohydrates.A serving of most carbohydrate-rich foods contains about 15 g of carbohydrates. The following list includes serving sizes of carbohydrate-rich foods that provide 15 g ofcarbohydrate per  serving:   1 slice of bread (1 oz) or 1 six-inch tortilla.    of a hamburger bun or English muffin.  4-6 crackers.   cup unsweetened dry cereal.    cup hot cereal.   cup rice or pasta.    cup mashed potatoes or  of a large baked potato.  1 cup fresh fruit or one small piece of fruit.    cup canned or frozen fruit or fruit juice.  1 cup milk.   cup plain fat-free yogurt or yogurt sweetened with artificial sweeteners.   cup cooked dried beans or starchy vegetable, such as peas, corn, or potatoes.  Decide the number of standard-size servings that you will eat. Multiply that number of servings by 15 (the grams of carbohydrates in that serving). For example, if you eat 2 cups of strawberries, you will have eaten 2 servings and 30 g of carbohydrates (2 servings x 15 g = 30 g). For foods such as  soups and casseroles, in which more than one food is mixed in, you will need to count the carbohydrates in each food that is included. EXAMPLE OF CARBOHYDRATE COUNTING Sample Dinner  3 oz chicken breast.   cup of brown rice.   cup of corn.  1 cup milk.   1 cup strawberries with sugar-free whipped topping.  Carbohydrate Calculation Step 1: Identify the foods that contain carbohydrates:   Rice.   Corn.   Milk.   Strawberries. Step 2:Calculate the number of servings eaten of each:   2 servings of rice.   1 serving of corn.   1 serving of milk.   1 serving of strawberries. Step 3: Multiply each of those number of servings by 15 g:   2 servings of rice x 15 g = 30 g.   1 serving of corn x 15 g = 15 g.   1 serving of milk x 15 g = 15 g.   1 serving of strawberries x 15 g = 15 g. Step 4: Add together all of the amounts to find the total grams of carbohydrates eaten: 30 g + 15 g + 15 g + 15 g = 75 g. Document Released: 09/28/2005 Document Revised: 02/12/2014 Document Reviewed: 08/25/2013 [facility] Patient Information 2015 [place], [place]. This information is not intended to replace advice given to you by your health care provider. Make sure you discuss any questions you have with your health care provider.

## 2014-10-01 NOTE — Progress Notes (Signed)
Patient ID: Patricia [name], female   DOB: 1956-04-14, 58 y.o.   MRN: [id number]   Patricia [name], is a 58 y.o. female  MGQ:[id number]  [id number]:[id number]  DOB - [birth year]  Chief Complaint  Patient presents with  . Follow-up        Subjective:   Patricia [name] is a 58 y.o. female here today for a follow up visit. Patient is known to have hypertension, diabetes mellitus, major depression and obesity. She has had today for her routine follow-up on his A1c check. She has no complaint today. She recently started a relationship and wants to be sexually active soon, she wonders if Lexapro will decrease her libido and if so she would like to change medication. Her boyfriend is not yet in town, likely going to be here sometimes in the new year. Until then she will continue her Lexapro and if her sexual drive is unaffected. Her boyfriend arrives then she will change it. Patient is compliant with her medications, diet and exercise. She has not yet filled her prescription for atorvastatin. She also wants a dental referral. Patient has No headache, No chest pain, No abdominal pain - No Nausea, No new weakness tingling or numbness, No Cough - SOB.  Problem  Colon Cancer Screening    ALLERGIES: Allergies  Allergen Reactions  . Sulfa Antibiotics Itching and Rash    PAST MEDICAL HISTORY: Past Medical History  Diagnosis Date  . Diabetes mellitus without complication   . Hypertension   . Obesity     MEDICATIONS AT HOME: Prior to Admission medications   Medication Sig Start Date End Date Taking? Authorizing Provider  aspirin 81 MG tablet Take 81 mg by mouth daily.   Yes Historical Provider, MD  atorvastatin (LIPITOR) 10 MG tablet Take 1 tablet (10 mg total) by mouth daily. 10/01/14  Yes Tresa Garter, MD  escitalopram (LEXAPRO) 10 MG tablet Take 1 tablet (10 mg total) by mouth daily. 09/14/13  Yes Ripudeep K Rai, MD  FARXIGA 5 MG TABS TAKE 1 TABLET BY MOUTH ONCE DAILY   Yes Aarian Cleaver E Izeyah Deike, MD    glipiZIDE (GLUCOTROL) 10 MG tablet Take 2 tablets (20 mg total) by mouth 2 (two) times daily before a meal. 07/13/14  Yes Jasemine Nawaz E Doreene Burke, MD  glucose monitoring kit (FREESTYLE) monitoring kit 1 each by Does not apply route as needed for other. 10/23/13  Yes Tresa Garter, MD  hydrochlorothiazide (HYDRODIURIL) 25 MG tablet Take 1 tablet (25 mg total) by mouth daily. 06/28/14  Yes Tresa Garter, MD  ibuprofen (ADVIL,MOTRIN) 200 MG tablet Take 200 mg by mouth every 6 (six) hours as needed for pain.   Yes Historical Provider, MD  metFORMIN (GLUCOPHAGE) 1000 MG tablet Take 1 tablet (1,000 mg total) by mouth 2 (two) times daily. 12/13/13  Yes Lorayne Marek, MD  metroNIDAZOLE (FLAGYL) 500 MG tablet Take 1 tablet (500 mg total) by mouth 3 (three) times daily. Patient not taking: Reported on 10/01/2014 12/13/13   Lorayne Marek, MD  Vitamin D, Ergocalciferol, (DRISDOL) 50000 UNITS CAPS capsule Take 1 capsule (50,000 Units total) by mouth every 7 (seven) days. Patient not taking: Reported on 10/01/2014 12/14/13   Lorayne Marek, MD     Objective:   Filed Vitals:   10/01/14 1240  BP: 136/85  Pulse: 87  Temp: 98.3 F (36.8 C)  TempSrc: Oral  Resp: 16  Height: _0  (1.626 m)  Weight: 166 lb (75.297 kg)  SpO2: 98%    Exam General  appearance : Awake, alert, not in any distress. Speech Clear. Not toxic looking HEENT: Atraumatic and Normocephalic, pupils equally reactive to light and accomodation Neck: supple, no JVD. No cervical lymphadenopathy.  Chest:Good air entry bilaterally, no added sounds  CVS: S1 S2 regular, no murmurs.  Abdomen: Bowel sounds present, Non tender and not distended with no gaurding, rigidity or rebound. Extremities: B/L Lower Ext shows no edema, both legs are warm to touch Neurology: Awake alert, and oriented X 3, CN II-XII intact, Non focal Skin:No Rash Wounds:N/A  Data Review Lab Results  Component Value Date   HGBA1C 8.7 10/01/2014   HGBA1C 8.0 06/28/2014    HGBA1C 8.4 03/28/2014     Assessment & Plan   1. Type 2 diabetes mellitus without complication  - Glucose (CBG) - HgB A1c is 8.7% today, up from 8.0% 3 months ago - Patient wants to add nutritional control to the current regimen before we go up on the dose of farxiga - Diabetic diet  Aim for 2-3 Carb Choices per meal (30-45 grams) +/- 1 either way  Aim for 0-15 Carbs per snack if hungry  Include protein in moderation with your meals and snacks  Consider reading food labels for Total Carbohydrate and Fat Grams of foods  Consider checking BG at alternate times per day  Continue taking medication as directed Fruit Punch - find one with no sugar  Measure and decrease portions of carbohydrate foods  Make your plate and don't go back for seconds   2. Dyslipidemia  Re-prescribed - atorvastatin (LIPITOR) 10 MG tablet; Take 1 tablet (10 mg total) by mouth daily.  Dispense: 30 tablet; Refill: 3  3. Colon cancer screening  - HM COLONOSCOPY - Ambulatory referral to Gastroenterology  4. Dental caries  - Ambulatory referral to Dentistry   Advised on Lexapro and Sexual drive, if she needs to change antidepressant because of decreased libido, please call the clinic and we will consider Prozac or any other SSRI that does not affect libido.   Return in about 3 months (around 12/31/2014), or if symptoms worsen or fail to improve, for Follow up HTN, Hemoglobin A1C and Follow up, DM.  The patient was given clear instructions to go to ER or return to medical center if symptoms don't improve, worsen or new problems develop. The patient verbalized understanding. The patient was told to call to get lab results if they haven't heard anything in the next week.   This note has been created with Surveyor, quantity. Any transcriptional errors are unintentional.    Angelica Chessman, MD, [place], [place], [place] and [place], [place]   10/01/2014, 1:04 PM

## 2014-10-02 ENCOUNTER — Telehealth: Payer: Self-pay | Admitting: Internal Medicine

## 2014-10-02 NOTE — Telephone Encounter (Signed)
Pt had appt on 10/01/14 and forgot to request refill on hydrochlorothiazide (HYDRODIURIL) 25 MG tablet, escitalopram (LEXAPRO) 10 MG tablet, and vitamin D. Please f/u with pt.

## 2014-10-03 ENCOUNTER — Other Ambulatory Visit: Payer: Self-pay | Admitting: Emergency Medicine

## 2014-10-03 DIAGNOSIS — I10 Essential (primary) hypertension: Secondary | ICD-10-CM

## 2014-10-03 MED ORDER — HYDROCHLOROTHIAZIDE 25 MG PO TABS: 25.0000 mg | ORAL_TABLET | Freq: Every day | ORAL | Status: DC

## 2014-10-03 NOTE — Telephone Encounter (Signed)
escitalopram (LEXAPRO) 10 MG tablet, and vitamin D. Please f/u with pt.      Requesting refills. Forgot to mention during last ov

## 2014-10-11 ENCOUNTER — Telehealth: Payer: Self-pay | Admitting: Internal Medicine

## 2014-10-11 NOTE — Telephone Encounter (Signed)
Pt just received letter, dated 06/29/13, from Department of [employer] stating they need letter addressing her HGBA1C level in order to evaluate her driving privilege. Please f/u with pt when ready.

## 2014-10-16 ENCOUNTER — Telehealth: Payer: Self-pay | Admitting: Emergency Medicine

## 2014-10-16 NOTE — Telephone Encounter (Signed)
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

## 2014-10-19 ENCOUNTER — Other Ambulatory Visit: Payer: Self-pay | Admitting: *Deleted

## 2014-10-19 MED ORDER — ESCITALOPRAM OXALATE 10 MG PO TABS: 10.0000 mg | ORAL_TABLET | Freq: Every day | ORAL | Status: DC

## 2014-10-19 NOTE — Progress Notes (Signed)
Pt called needing a refill for her lexapro. After speaking with Dr. Doreene Burke I sent the rx to our pharmacy. Pt states that she will pick up the rx on Monday.

## 2014-10-22 ENCOUNTER — Ambulatory Visit: Payer: Self-pay | Attending: Internal Medicine

## 2014-11-04 ENCOUNTER — Emergency Department (HOSPITAL_COMMUNITY): Payer: Self-pay

## 2014-11-04 ENCOUNTER — Encounter (HOSPITAL_COMMUNITY): Payer: Self-pay | Admitting: *Deleted

## 2014-11-04 ENCOUNTER — Emergency Department (HOSPITAL_COMMUNITY)
Admission: EM | Admit: 2014-11-04 | Discharge: 2014-11-04 | Disposition: A | Discharge status: Home / Self Care | Payer: Self-pay | Attending: Emergency Medicine | Admitting: Emergency Medicine

## 2014-11-04 DIAGNOSIS — Z7982 Long term (current) use of aspirin: Secondary | ICD-10-CM | POA: Insufficient documentation

## 2014-11-04 DIAGNOSIS — E119 Type 2 diabetes mellitus without complications: Secondary | ICD-10-CM | POA: Insufficient documentation

## 2014-11-04 DIAGNOSIS — R000 Tachycardia, unspecified: Secondary | ICD-10-CM | POA: Insufficient documentation

## 2014-11-04 DIAGNOSIS — R6883 Chills (without fever): Secondary | ICD-10-CM | POA: Insufficient documentation

## 2014-11-04 DIAGNOSIS — I10 Essential (primary) hypertension: Secondary | ICD-10-CM | POA: Insufficient documentation

## 2014-11-04 DIAGNOSIS — R109 Unspecified abdominal pain: Secondary | ICD-10-CM

## 2014-11-04 DIAGNOSIS — R1032 Left lower quadrant pain: Secondary | ICD-10-CM

## 2014-11-04 DIAGNOSIS — E669 Obesity, unspecified: Secondary | ICD-10-CM | POA: Insufficient documentation

## 2014-11-04 DIAGNOSIS — N179 Acute kidney failure, unspecified: Secondary | ICD-10-CM | POA: Insufficient documentation

## 2014-11-04 DIAGNOSIS — Z79899 Other long term (current) drug therapy: Secondary | ICD-10-CM | POA: Insufficient documentation

## 2014-11-04 DIAGNOSIS — Z791 Long term (current) use of non-steroidal anti-inflammatories (NSAID): Secondary | ICD-10-CM | POA: Insufficient documentation

## 2014-11-04 LAB — COMPREHENSIVE METABOLIC PANEL
ALBUMIN: 3.4 g/dL — AB (ref 3.5–5.2)
ALT: 12 U/L (ref 0–35)
AST: 21 U/L (ref 0–37)
Alkaline Phosphatase: 72 U/L (ref 39–117)
Anion gap: 10 (ref 5–15)
BUN: 35 mg/dL — ABNORMAL HIGH (ref 6–23)
CALCIUM: 9 mg/dL (ref 8.4–10.5)
CO2: 24 mmol/L (ref 19–32)
CREATININE: 2.03 mg/dL — AB (ref 0.50–1.10)
Chloride: 102 mmol/L (ref 96–112)
GFR calc non Af Amer: 26 mL/min — ABNORMAL LOW (ref >90)
GFR, EST AFRICAN AMERICAN: 30 mL/min — AB (ref >90)
Glucose, Bld: 164 mg/dL — ABNORMAL HIGH (ref 70–99)
Potassium: 4.2 mmol/L (ref 3.5–5.1)
SODIUM: 136 mmol/L (ref 135–145)
TOTAL PROTEIN: 6.7 g/dL (ref 6.0–8.3)
Total Bilirubin: 0.7 mg/dL (ref 0.3–1.2)

## 2014-11-04 LAB — URINALYSIS, ROUTINE W REFLEX MICROSCOPIC
BILIRUBIN URINE: NEGATIVE
Hgb urine dipstick: NEGATIVE
Ketones, ur: NEGATIVE mg/dL
Leukocytes, UA: NEGATIVE
Nitrite: NEGATIVE
Protein, ur: NEGATIVE mg/dL
SPECIFIC GRAVITY, URINE: 1.026 (ref 1.005–1.030)
UROBILINOGEN UA: 0.2 mg/dL (ref 0.0–1.0)
pH: 5.5 (ref 5.0–8.0)

## 2014-11-04 LAB — CBC WITH DIFFERENTIAL/PLATELET
BASOS ABS: 0 10*3/uL (ref 0.0–0.1)
BASOS PCT: 0 % (ref 0–1)
Eosinophils Absolute: 0.1 10*3/uL (ref 0.0–0.7)
Eosinophils Relative: 1 % (ref 0–5)
HCT: 31.6 % — ABNORMAL LOW (ref 36.0–46.0)
Hemoglobin: 10.6 g/dL — ABNORMAL LOW (ref 12.0–15.0)
LYMPHS ABS: 4.1 10*3/uL — AB (ref 0.7–4.0)
LYMPHS PCT: 54 % — AB (ref 12–46)
MCH: 28.3 pg (ref 26.0–34.0)
MCHC: 33.5 g/dL (ref 30.0–36.0)
MCV: 84.3 fL (ref 78.0–100.0)
Monocytes Absolute: 0.4 10*3/uL (ref 0.1–1.0)
Monocytes Relative: 5 % (ref 3–12)
NEUTROS PCT: 40 % — AB (ref 43–77)
Neutro Abs: 3 10*3/uL (ref 1.7–7.7)
Platelets: 259 10*3/uL (ref 150–400)
RBC: 3.75 MIL/uL — ABNORMAL LOW (ref 3.87–5.11)
RDW: 13.4 % (ref 11.5–15.5)
WBC: 7.6 10*3/uL (ref 4.0–10.5)

## 2014-11-04 LAB — URINE MICROSCOPIC-ADD ON

## 2014-11-04 MED ORDER — HYDROCODONE-ACETAMINOPHEN 5-325 MG PO TABS: 2.0000 | ORAL_TABLET | ORAL | Status: DC | PRN

## 2014-11-04 MED ORDER — SODIUM CHLORIDE 0.9 % IV BOLUS (SEPSIS)
1000.0000 mL | Freq: Once | INTRAVENOUS | Status: AC
  Administered 2014-11-04: 1000 mL via INTRAVENOUS

## 2014-11-04 MED ORDER — MORPHINE SULFATE 2 MG/ML IJ SOLN
2.0000 mg | Freq: Once | INTRAMUSCULAR | Status: AC
  Administered 2014-11-04: 2 mg via INTRAVENOUS
  Filled 2014-11-04: qty 1

## 2014-11-04 NOTE — ED Notes (Signed)
Pt reports left lower abd/groin pain and lower back pain. Pt thinks she has UTI, possible fever and pain with urination.

## 2014-11-04 NOTE — ED Provider Notes (Signed)
CSN: [id number]     Arrival date & time 11/04/14  1521 History   First MD Initiated Contact with Patient 11/04/14 1836     Chief Complaint  Patient presents with  . Dysuria  . Back Pain     (Consider location/radiation/quality/duration/timing/severity/associated sxs/prior Treatment) Patient is a 59 y.o. female presenting with back pain and abdominal pain. The history is provided by the patient. No language interpreter was used.  Back Pain Associated symptoms: abdominal pain and dysuria   Associated symptoms: no chest pain, no fever, no headaches, no numbness, no pelvic pain and no weakness   Abdominal Pain Pain location:  LLQ Pain quality: sharp   Pain radiates to:  L leg and L flank Pain severity:  Moderate Onset quality:  Sudden Duration:  3 days Timing:  Intermittent Progression:  Waxing and waning Chronicity:  New Relieved by:  Nothing Worsened by:  Nothing tried Ineffective treatments:  None tried Associated symptoms: chills, dysuria and nausea   Associated symptoms: no chest pain, no cough, no diarrhea, no fatigue, no fever, no shortness of breath, no sore throat, no vaginal bleeding, no vaginal discharge and no vomiting     Past Medical History  Diagnosis Date  . Diabetes mellitus without complication   . Hypertension   . Obesity    Past Surgical History  Procedure Laterality Date  . Cesarean section  1982, 1988  . Abdominal hysterectomy    . Breast surgery      reduction   Family History  Problem Relation Age of Onset  . Diabetes Brother   . Cancer Brother     stomach   History  Substance Use Topics  . Smoking status: Never Smoker   . Smokeless tobacco: Not on file  . Alcohol Use: No   OB History    Gravida Para Term Preterm AB TAB SAB Ectopic Multiple Living   _0 Review of Systems  Constitutional: Positive for chills. Negative for fever, diaphoresis, activity change, appetite change and fatigue.  HENT: Negative for congestion,  facial swelling, rhinorrhea and sore throat.   Eyes: Negative for photophobia and discharge.  Respiratory: Negative for cough, chest tightness and shortness of breath.   Cardiovascular: Negative for chest pain, palpitations and leg swelling.  Gastrointestinal: Positive for nausea and abdominal pain. Negative for vomiting and diarrhea.  Endocrine: Negative for polydipsia and polyuria.  Genitourinary: Positive for dysuria. Negative for frequency, vaginal bleeding, vaginal discharge, difficulty urinating and pelvic pain.  Musculoskeletal: Positive for back pain. Negative for arthralgias, neck pain and neck stiffness.  Skin: Negative for color change and wound.  Allergic/Immunologic: Negative for immunocompromised state.  Neurological: Negative for facial asymmetry, weakness, numbness and headaches.  Hematological: Does not bruise/bleed easily.  Psychiatric/Behavioral: Negative for confusion and agitation.      Allergies  Sulfa antibiotics  Home Medications   Prior to Admission medications   Medication Sig Start Date End Date Taking? Authorizing Provider  aspirin EC 81 MG tablet Take 81 mg by mouth daily.   Yes Historical Provider, MD  atorvastatin (LIPITOR) 10 MG tablet Take 1 tablet (10 mg total) by mouth daily. 10/01/14  Yes Tresa Garter, MD  escitalopram (LEXAPRO) 10 MG tablet Take 1 tablet (10 mg total) by mouth daily. Patient taking differently: Take 10 mg by mouth at bedtime as needed (anxiety/ insomnia).  10/19/14  Yes Olugbemiga Essie Christine, MD  FARXIGA 5 MG TABS TAKE 1 TABLET  BY MOUTH ONCE DAILY   Yes Tresa Garter, MD  glipiZIDE (GLUCOTROL) 10 MG tablet Take 2 tablets (20 mg total) by mouth 2 (two) times daily before a meal. 07/13/14  Yes Olugbemiga E Doreene Burke, MD  hydrochlorothiazide (HYDRODIURIL) 25 MG tablet Take 1 tablet (25 mg total) by mouth daily. 10/03/14  Yes Tresa Garter, MD  ibuprofen (ADVIL,MOTRIN) 200 MG tablet Take 400 mg by mouth 3 (three) times daily  as needed (pain).    Yes Historical Provider, MD  metFORMIN (GLUCOPHAGE) 1000 MG tablet Take 1 tablet (1,000 mg total) by mouth 2 (two) times daily. 12/13/13  Yes Lorayne Marek, MD  OVER THE COUNTER MEDICATION Take 2 tablets by mouth 2 (two) times daily as needed (urinary pain). Cystex Plus Urinary Pain Relief Tablets: methenamine 967 mg, sodium salicylate 591.6 mg   Yes Historical Provider, MD  glucose monitoring kit (FREESTYLE) monitoring kit 1 each by Does not apply route as needed for other. 10/23/13   Tresa Garter, MD  HYDROcodone-acetaminophen (NORCO) 5-325 MG per tablet Take 2 tablets by mouth every 4 (four) hours as needed. 11/04/14   Ernestina Patches, MD  metroNIDAZOLE (FLAGYL) 500 MG tablet Take 1 tablet (500 mg total) by mouth 3 (three) times daily. Patient not taking: Reported on 10/01/2014 12/13/13   Lorayne Marek, MD  Vitamin D, Ergocalciferol, (DRISDOL) 50000 UNITS CAPS capsule Take 1 capsule (50,000 Units total) by mouth every 7 (seven) days. Patient not taking: Reported on 10/01/2014 12/14/13   Lorayne Marek, MD   BP 173/72 mmHg  Pulse 78  Temp(Src) 98 F (36.7 C) (Oral)  Resp 18  Ht 5' 6" (1.676 m)  Wt 160 lb (72.576 kg)  BMI 25.84 kg/m2  SpO2 99% Physical Exam  Constitutional: She is oriented to person, place, and time. She appears well-developed and well-nourished. No distress.  HENT:  Head: Normocephalic and atraumatic.  Mouth/Throat: No oropharyngeal exudate.  Eyes: Pupils are equal, round, and reactive to light.  Neck: Normal range of motion. Neck supple.  Cardiovascular: Normal rate, regular rhythm and normal heart sounds.  Exam reveals no gallop and no friction rub.   No murmur heard. Pulmonary/Chest: Effort normal and breath sounds normal. No respiratory distress. She has no wheezes. She has no rales.  Abdominal: Soft. Bowel sounds are normal. She exhibits no distension and no mass. There is tenderness in the left lower quadrant. There is CVA tenderness (L, mild).  There is no rigidity, no rebound and no guarding.  Musculoskeletal: Normal range of motion. She exhibits no edema.       Left hip: She exhibits tenderness. She exhibits normal range of motion and normal strength.  Neurological: She is alert and oriented to person, place, and time.  Skin: Skin is warm and dry.  Psychiatric: She has a normal mood and affect.    ED Course  Procedures (including critical care time) Labs Review Labs Reviewed  URINALYSIS, ROUTINE W REFLEX MICROSCOPIC - Abnormal; Notable for the following:    Glucose, UA >1000 (*)    All other components within normal limits  CBC WITH DIFFERENTIAL/PLATELET - Abnormal; Notable for the following:    RBC 3.75 (*)    Hemoglobin 10.6 (*)    HCT 31.6 (*)    Neutrophils Relative % 40 (*)    Lymphocytes Relative 54 (*)    Lymphs Abs 4.1 (*)    All other components within normal limits  COMPREHENSIVE METABOLIC PANEL - Abnormal; Notable for the following:    Glucose,  Bld 164 (*)    BUN 35 (*)    Creatinine, Ser 2.03 (*)    Albumin 3.4 (*)    GFR calc non Af Amer 26 (*)    GFR calc Af Amer 30 (*)    All other components within normal limits  URINE MICROSCOPIC-ADD ON    Imaging Review Ct Renal Stone Study  11/04/2014   CLINICAL DATA:  Pt reports left lower abd/groin pain and lower back pain. Pt thinks she has UTI, possible fever and pain with urination. - hx stones, - hematuria  EXAM: CT ABDOMEN AND PELVIS WITHOUT CONTRAST  TECHNIQUE: Multidetector CT imaging of the abdomen and pelvis was performed following the standard protocol without IV contrast.  COMPARISON:  12/10/2006  FINDINGS: Small nonobstructing stone in the upper pole of the left kidney. No other intrarenal stones. No renal masses. No hydronephrosis. Ureters normal in course and in caliber. No ureteral stones. Bladder is unremarkable.  Lung bases essentially clear.  Heart normal in size.  Liver, spleen, gallbladder, pancreas, adrenal glands:  Normal.  Uterus surgically  absent.  No pelvic masses.  No pathologically enlarged lymph nodes. No abnormal fluid collections.  Multiple left colon diverticula. No diverticulitis. Remainder of the colon is unremarkable. Normal small bowel. Normal appendix.  Mild degenerative changes noted of the visualized spine most evident at L4-L5 where there is a grade 1 anterolisthesis.  IMPRESSION: 1. No acute findings.  No ureteral stone or obstructive uropathy. 2. Small nonobstructing stone in the upper pole of the left kidney. 3. Multiple left colon diverticula without evidence of diverticulitis.   Electronically Signed   By: Lajean Manes M.D.   On: 11/04/2014 19:35     EKG Interpretation None      MDM   Final diagnoses:  LLQ pain  Left flank pain  AKI (acute kidney injury)    Pt is a 59 y.o. female with Pmhx as above who presents with 3 days of left lower quadrant pain with radiation to the left hip.  Now today also with left flank pain.  She is also had mild dysuria.  She's had chills but no fevers.  Pain is sharp and waxing and waning.  No known aggravating or alleviating symptoms.  She had nausea on the first day of illness has had none since.  She's had no vomiting.  She has not taken her diabetes medication today, but states that prior to today.  Her blood sugar has been controlled on physical exam she is mildly tachycardic but in no acute distress.  She is positive left lower quadrant tenderness palpation, mild left-sided CVA tenderness, but also has reproducible tenderness of the left hip and left proximal thigh.  During does not appear infected and has no blood, however, I feel symptoms concerning for kidney stone which she does not have a history of CBC and BMP with CR elevation to 2.03.  CT stone study with no acute findings, but small non obstructing stone. In L kidney. Given cr elevation, it is possible that pt had a stone that has since passes. She is comfortable after 43m IV morphine. WIll d/c home w/ instructions for  outpt PCP F/U for repeat Ct. Check.      RBecky Saxevaluation in the Emergency Department is complete. It has been determined that no acute conditions requiring further emergency intervention are present at this time. The patient/guardian have been advised of the diagnosis and plan. We have discussed signs and symptoms that warrant return to  the ED, such as changes or worsening in symptoms, worsening pain, fever, inability to tolerate PO.       Ernestina Patches, MD 11/05/14 2220

## 2014-11-04 NOTE — Discharge Instructions (Signed)
Flank Pain Flank pain refers to pain that is located on the side of the body between the upper abdomen and the back. The pain may occur over a short period of time (acute) or may be long-term or reoccurring (chronic). It may be mild or severe. Flank pain can be caused by many things. CAUSES  Some of the more common causes of flank pain include:  Muscle strains.   Muscle spasms.   A disease of your spine (vertebral disk disease).   A lung infection (pneumonia).   Fluid around your lungs (pulmonary edema).   A kidney infection.   Kidney stones.   A very painful skin rash caused by the chickenpox virus (shingles).   Gallbladder disease.  [place] care will depend on the cause of your pain. In general,  Rest as directed by your caregiver.  Drink enough fluids to keep your urine clear or pale yellow.  Only take over-the-counter or prescription medicines as directed by your caregiver. Some medicines may help relieve the pain.  Tell your caregiver about any changes in your pain.  Follow up with your caregiver as directed. SEEK IMMEDIATE MEDICAL CARE IF:   Your pain is not controlled with medicine.   You have new or worsening symptoms.  Your pain increases.   You have abdominal pain.   You have shortness of breath.   You have persistent nausea or vomiting.   You have swelling in your abdomen.   You feel faint or pass out.   You have blood in your urine.  You have a fever or persistent symptoms for more than 2-3 days.  You have a fever and your symptoms suddenly get worse. MAKE SURE YOU:   Understand these instructions.  Will watch your condition.  Will get help right away if you are not doing well or get worse. Document Released: 11/19/2005 Document Revised: 06/22/2012 Document Reviewed: 05/12/2012 [facility] Patient Information 2015 [place], [place]. This information is not intended to replace advice given to you by your  health care provider. Make sure you discuss any questions you have with your health care provider.   Acute Kidney Injury Acute kidney injury is a disease in which there is sudden (acute) damage to the kidneys. The kidneys are 2 organs that lie on either side of the spine between the middle of the back and the front of the abdomen. The kidneys:  Remove wastes and extra water from the blood.   Produce important hormones. These help keep bones strong, regulate blood pressure, and help create red blood cells.   Balance the fluids and chemicals in the blood and tissues. A small amount of kidney damage may not cause problems, but a large amount of damage may make it difficult or impossible for the kidneys to work the way they should. Acute kidney injury may develop into long-lasting (chronic) kidney disease. It may also develop into a life-threatening disease called end-stage kidney disease. Acute kidney injury can get worse very quickly, so it should be treated right away. Early treatment may prevent other kidney diseases from developing.  CAUSES   A problem with blood flow to the kidneys. This may be caused by:   Blood loss.   Heart disease.   Severe burns.   Liver disease.  Direct damage to the kidneys. This may be caused by:  Some medicines.   A kidney infection.   Poisoning or consuming toxic substances.   A surgical wound.   A blow to the  kidney area.   A problem with urine flow. This may be caused by:   Cancer.   Kidney stones.   An enlarged prostate. SYMPTOMS   Swelling (edema) of the legs, ankles, or feet.   Tiredness (lethargy).   Nausea or vomiting.   Confusion.   Problems with urination, such as:   Painful or burning feeling during urination.   Decreased urine production.   Frequent accidents in children who are potty trained.   Bloody urine.   Muscle twitches and cramps.   Shortness of breath.   Seizures.   Chest  pain or pressure. Sometimes, no symptoms are present. DIAGNOSIS Acute kidney injury may be detected and diagnosed by tests, including blood, urine, imaging, or kidney biopsy tests.  TREATMENT Treatment of acute kidney injury varies depending on the cause and severity of the kidney damage. In mild cases, no treatment may be needed. The kidneys may heal on their own. If acute kidney injury is more severe, your caregiver will treat the cause of the kidney damage, help the kidneys heal, and prevent complications from occurring. Severe cases may require a procedure to remove toxic wastes from the body (dialysis) or surgery to repair kidney damage. Surgery may involve:   Repair of a torn kidney.   Removal of an obstruction. Most of the time, you will need to stay overnight at the hospital.  HOME CARE INSTRUCTIONS:  Follow your prescribed diet.  Only take over-the-counter or prescription medicines as directed by your caregiver.  Do not take any new medicines (prescription, over-the-counter, or nutritional supplements) unless approved by your caregiver. Many medicines can worsen your kidney damage or need to have the dose adjusted.   Keep all follow-up appointments as directed by your caregiver.  Observe your condition to make sure you are healing as expected. SEEK IMMEDIATE MEDICAL CARE IF:  You are feeling ill or have severe pain in the back or side.   Your symptoms return or you have new symptoms.  You have any symptoms of end-stage kidney disease. These include:   Persistent itchiness.   Loss of appetite.   Headaches.   Abnormally dark or light skin.  Numbness in the hands or feet.   Easy bruising.   Frequent hiccups.   Menstruation stops.   You have a fever.  You have increased urine production.  You have pain or bleeding when urinating. MAKE SURE YOU:   Understand these instructions.  Will watch your condition.  Will get help right away if you are  not doing well or get worse Document Released: 04/13/2011 Document Revised: 01/23/2013 Document Reviewed: 05/27/2012 [facility] Patient Information 2015 [place], [place]. This information is not intended to replace advice given to you by your health care provider. Make sure you discuss any questions you have with your health care provider.

## 2014-11-12 ENCOUNTER — Telehealth: Payer: Self-pay | Admitting: *Deleted

## 2014-11-12 NOTE — Telephone Encounter (Signed)
Pt has orange card and wanted Dental referral.  NCM contacted [id number] rep Saintclair Halsted) to follow up with pt regarding this matter.

## 2014-11-15 ENCOUNTER — Encounter: Payer: Self-pay | Admitting: Internal Medicine

## 2014-11-15 ENCOUNTER — Ambulatory Visit: Payer: Self-pay | Attending: Internal Medicine | Admitting: Internal Medicine

## 2014-11-15 VITALS — BP 152/83 | HR 98 | Temp 97.9°F | Ht 65.0 in | Wt 164.6 lb

## 2014-11-15 DIAGNOSIS — E785 Hyperlipidemia, unspecified: Secondary | ICD-10-CM | POA: Insufficient documentation

## 2014-11-15 DIAGNOSIS — K029 Dental caries, unspecified: Secondary | ICD-10-CM | POA: Insufficient documentation

## 2014-11-15 DIAGNOSIS — R109 Unspecified abdominal pain: Secondary | ICD-10-CM | POA: Insufficient documentation

## 2014-11-15 DIAGNOSIS — E1121 Type 2 diabetes mellitus with diabetic nephropathy: Secondary | ICD-10-CM | POA: Insufficient documentation

## 2014-11-15 DIAGNOSIS — E119 Type 2 diabetes mellitus without complications: Secondary | ICD-10-CM

## 2014-11-15 DIAGNOSIS — E1140 Type 2 diabetes mellitus with diabetic neuropathy, unspecified: Secondary | ICD-10-CM | POA: Insufficient documentation

## 2014-11-15 LAB — LIPID PANEL
CHOL/HDL RATIO: 4.3 ratio
Cholesterol: 162 mg/dL (ref 0–200)
HDL: 38 mg/dL — ABNORMAL LOW (ref >39)
LDL Cholesterol: 61 mg/dL (ref 0–99)
Triglycerides: 315 mg/dL — ABNORMAL HIGH (ref <150)
VLDL: 63 mg/dL — ABNORMAL HIGH (ref 0–40)

## 2014-11-15 LAB — POCT URINALYSIS DIPSTICK
BILIRUBIN UA: NEGATIVE
Glucose, UA: 500
Ketones, UA: NEGATIVE
LEUKOCYTES UA: NEGATIVE
NITRITE UA: NEGATIVE
Protein, UA: 100
RBC UA: NEGATIVE
Spec Grav, UA: 1.015
Urobilinogen, UA: 0.2
pH, UA: 5.5

## 2014-11-15 LAB — BASIC METABOLIC PANEL
BUN: 30 mg/dL — ABNORMAL HIGH (ref 6–23)
CHLORIDE: 103 meq/L (ref 96–112)
CO2: 25 mEq/L (ref 19–32)
Calcium: 9.6 mg/dL (ref 8.4–10.5)
Creat: 1.81 mg/dL — ABNORMAL HIGH (ref 0.50–1.10)
Glucose, Bld: 242 mg/dL — ABNORMAL HIGH (ref 70–99)
Potassium: 4.7 mEq/L (ref 3.5–5.3)
SODIUM: 137 meq/L (ref 135–145)

## 2014-11-15 LAB — GLUCOSE, POCT (MANUAL RESULT ENTRY): POC Glucose: 246 mg/dl — AB (ref 70–99)

## 2014-11-15 MED ORDER — SITAGLIPTIN PHOSPHATE 50 MG PO TABS: 50.0000 mg | ORAL_TABLET | Freq: Every day | ORAL | Status: DC

## 2014-11-15 NOTE — Progress Notes (Signed)
Patient ID: Patricia [name], female   DOB: 09-06-1956, 59 y.o.   MRN: [id number]   Patricia [name], is a 59 y.o. female  [id number]:[id number]  [id number]  DOB - [birth year]  Chief Complaint  Patient presents with  . Follow-up        Subjective:   Patricia [name] is a 59 y.o. female here today for a follow up visit. Patient has hypertension, diabetes mellitus, major depression and obesity. She was recently seen in the ED for dysuria and left flank pain. Patient is here for follow-up from ED visit for left flank pain. Patient states she is still having the pain but is better today then it was. She states the pain starts on the left side of her groin area and radiates up and around to her back and down her left leg. No known aggravating or relieving factor. In the ED her creatinine was found to be 2.03 up from her baseline of 1.1. She did not have UTI and her CT abdomen showed no acute findings with no ureteral stone or obstructive uropathy. There was a small nonobstructing stone in the upper pole of the left kidney, and multiple left colonic diverticula without evidence of diverticulitis. Patient has no new complaints today. Pt is also asking for a referral to a dentist. Patient has No headache, No chest pain, No Nausea, No new weakness tingling or numbness, No Cough - SOB.  Problem  Diabetic Nephropathy Associated With Type 2 Diabetes Mellitus  Flank Pain    ALLERGIES: Allergies  Allergen Reactions  . Sulfa Antibiotics Itching and Rash    PAST MEDICAL HISTORY: Past Medical History  Diagnosis Date  . Diabetes mellitus without complication   . Hypertension   . Obesity     MEDICATIONS AT HOME: Prior to Admission medications   Medication Sig Start Date End Date Taking? Authorizing Provider  aspirin EC 81 MG tablet Take 81 mg by mouth daily.   Yes Historical Provider, MD  atorvastatin (LIPITOR) 10 MG tablet Take 1 tablet (10 mg total) by mouth daily. 10/01/14  Yes Tresa Garter, MD    escitalopram (LEXAPRO) 10 MG tablet Take 1 tablet (10 mg total) by mouth daily. Patient taking differently: Take 10 mg by mouth at bedtime as needed (anxiety/ insomnia).  10/19/14  Yes Jamel Dunton Essie Christine, MD  glipiZIDE (GLUCOTROL) 10 MG tablet Take 2 tablets (20 mg total) by mouth 2 (two) times daily before a meal. 07/13/14  Yes Isabella Roemmich E Doreene Burke, MD  glucose monitoring kit (FREESTYLE) monitoring kit 1 each by Does not apply route as needed for other. 10/23/13  Yes Tresa Garter, MD  hydrochlorothiazide (HYDRODIURIL) 25 MG tablet Take 1 tablet (25 mg total) by mouth daily. 10/03/14  Yes Tresa Garter, MD  HYDROcodone-acetaminophen (NORCO) 5-325 MG per tablet Take 2 tablets by mouth every 4 (four) hours as needed. 11/04/14  Yes Ernestina Patches, MD  Vitamin D, Ergocalciferol, (DRISDOL) 50000 UNITS CAPS capsule Take 1 capsule (50,000 Units total) by mouth every 7 (seven) days. 12/14/13  Yes Deepak Advani, MD  sitaGLIPtin (JANUVIA) 50 MG tablet Take 1 tablet (50 mg total) by mouth daily. 11/15/14   Tresa Garter, MD     Objective:   Filed Vitals:   11/15/14 1213  BP: 152/83  Pulse: 98  Temp: 97.9 F (36.6 C)  TempSrc: Oral  Height: 5' 5" (1.651 m)  Weight: 164 lb 9.6 oz (74.662 kg)  SpO2: 99%    Exam General appearance : Awake, alert,  not in any distress. Speech Clear. Not toxic looking HEENT: Atraumatic and Normocephalic, pupils equally reactive to light and accomodation Neck: supple, no JVD. No cervical lymphadenopathy.  Chest:Good air entry bilaterally, no added sounds  CVS: S1 S2 regular, no murmurs.  Abdomen: Bowel sounds present, Non tender and not distended with no gaurding, rigidity or rebound. Extremities: B/L Lower Ext shows no edema, both legs are warm to touch Neurology: Awake alert, and oriented X 3, CN II-XII intact, Non focal Skin:No Rash Wounds:N/A  Data Review Lab Results  Component Value Date   HGBA1C 8.7 10/01/2014   HGBA1C 8.0 06/28/2014   HGBA1C  8.4 03/28/2014     Assessment & Plan   1. Flank pain  - Urinalysis Dipstick  2. Type 2 diabetes mellitus without complication - Glucose (CBG) - Microalbumin / creatinine urine ratio - Microalbumin, urine, 24 hour; Future - CBC with Differential/Platelet  - Discontinue Metformin and Farxiga based on GFR < 60  - Repeat BMP as above - Start Januvia - Continue Glipizide - Return in 2 weeks for CBG - Keep ambulatory CBG and bring log to clinic in 2 weeks  - sitaGLIPtin (JANUVIA) 50 MG tablet; Take 1 tablet (50 mg total) by mouth daily.  Dispense: 90 tablet; Refill: 3  Aim for 2-3 Carb Choices per meal (30-45 grams) +/- 1 either way  Aim for 0-15 Carbs per snack if hungry  Include protein in moderation with your meals and snacks  Consider reading food labels for Total Carbohydrate and Fat Grams of foods  Consider checking BG at alternate times per day  Continue taking medication as directed Fruit Punch - find one with no sugar  Measure and decrease portions of carbohydrate foods  Make your plate and don't go back for seconds   3. Dental caries  - Ambulatory referral to Dentistry  4. Diabetic nephropathy associated with type 2 diabetes mellitus  - Microalbumin / creatinine urine ratio - Microalbumin, urine, 24 hour; Future - Basic Metabolic Panel - Vit D  25 hydroxy (rtn osteoporosis monitoring)  5. Dyslipidemia  - Lipid panel  - Continue atorvastatin  Patient was counseled extensively about nutrition and exercise.  Return in about 2 weeks (around 11/29/2014), or if symptoms worsen or fail to improve, for CBG, Lab/Nurse Visit.  The patient was given clear instructions to go to ER or return to medical center if symptoms don't improve, worsen or new problems develop. The patient verbalized understanding. The patient was told to call to get lab results if they haven't heard anything in the next week.   This note has been created with Designer, industrial/product. Any transcriptional errors are unintentional.    Angelica Chessman, MD, [place], [place], [place] and [place], [place]   11/15/2014, 12:41 PM

## 2014-11-15 NOTE — Patient Instructions (Signed)
Diabetes and Exercise Exercising regularly is important. It is not just about losing weight. It has many health benefits, such as:  Improving your overall fitness, flexibility, and endurance.  Increasing your bone density.  Helping with weight control.  Decreasing your body fat.  Increasing your muscle strength.  Reducing stress and tension.  Improving your overall health. People with diabetes who exercise gain additional benefits because exercise:  Reduces appetite.  Improves the body's use of blood sugar (glucose).  Helps lower or control blood glucose.  Decreases blood pressure.  Helps control blood lipids (such as cholesterol and triglycerides).  Improves the body's use of the hormone insulin by:  Increasing the body's insulin sensitivity.  Reducing the body's insulin needs.  Decreases the risk for heart disease because exercising:  Lowers cholesterol and triglycerides levels.  Increases the levels of good cholesterol (such as high-density lipoproteins [HDL]) in the body.  Lowers blood glucose levels. YOUR ACTIVITY PLAN  Choose an activity that you enjoy and set realistic goals. Your health care provider or diabetes educator can help you make an activity plan that works for you. Exercise regularly as directed by your health care provider. This includes:  Performing resistance training twice a week such as push-ups, sit-ups, lifting weights, or using resistance bands.  Performing 150 minutes of cardio exercises each week such as walking, running, or playing sports.  Staying active and spending no more than 90 minutes at one time being inactive. Even short bursts of exercise are good for you. Three 10-minute sessions spread throughout the day are just as beneficial as a single 30-minute session. Some exercise ideas include:  Taking the dog for a walk.  Taking the stairs instead of the elevator.  Dancing to your favorite song.  Doing an exercise  video.  Doing your favorite exercise with a friend. RECOMMENDATIONS FOR EXERCISING WITH TYPE 1 OR TYPE 2 DIABETES   Check your blood glucose before exercising. If blood glucose levels are greater than 240 mg/dL, check for urine ketones. Do not exercise if ketones are present.  Avoid injecting insulin into areas of the body that are going to be exercised. For example, avoid injecting insulin into:  The arms when playing tennis.  The legs when jogging.  Keep a record of:  Food intake before and after you exercise.  Expected peak times of insulin action.  Blood glucose levels before and after you exercise.  The type and amount of exercise you have done.  Review your records with your health care provider. Your health care provider will help you to develop guidelines for adjusting food intake and insulin amounts before and after exercising.  If you take insulin or oral hypoglycemic agents, watch for signs and symptoms of hypoglycemia. They include:  Dizziness.  Shaking.  Sweating.  Chills.  Confusion.  Drink plenty of water while you exercise to prevent dehydration or heat stroke. Body water is lost during exercise and must be replaced.  Talk to your health care provider before starting an exercise program to make sure it is safe for you. Remember, almost any type of activity is better than none. Document Released: 12/19/2003 Document Revised: 02/12/2014 Document Reviewed: 03/07/2013 Exit[employer] Patient Information 2015 [employer]. This information is not intended to replace advice given to you by your health care provider. Make sure you discuss any questions you have with your health care provider. Basic Carbohydrate Counting for Diabetes Mellitus Carbohydrate counting is a method for keeping track of the amount of carbohydrates you eat.   Eating carbohydrates naturally increases the level of sugar (glucose) in your blood, so it is important for you to know the amount that is  okay for you to have in every meal. Carbohydrate counting helps keep the level of glucose in your blood within normal limits. The amount of carbohydrates allowed is different for every person. A dietitian can help you calculate the amount that is right for you. Once you know the amount of carbohydrates you can have, you can count the carbohydrates in the foods you want to eat. Carbohydrates are found in the following foods:  Grains, such as breads and cereals.  Dried beans and soy products.  Starchy vegetables, such as potatoes, peas, and corn.  Fruit and fruit juices.  Milk and yogurt.  Sweets and snack foods, such as cake, cookies, candy, chips, soft drinks, and fruit drinks. CARBOHYDRATE COUNTING There are two ways to count the carbohydrates in your food. You can use either of the methods or a combination of both. Reading the "Nutrition Facts" on Packaged Food The "Nutrition Facts" is an area that is included on the labels of almost all packaged food and beverages in the [place]. It includes the serving size of that food or beverage and information about the nutrients in each serving of the food, including the grams (g) of carbohydrate per serving.  Decide the number of servings of this food or beverage that you will be able to eat or drink. Multiply that number of servings by the number of grams of carbohydrate that is listed on the label for that serving. The total will be the amount of carbohydrates you will be having when you eat or drink this food or beverage. Learning Standard Serving Sizes of Food When you eat food that is not packaged or does not include "Nutrition Facts" on the label, you need to measure the servings in order to count the amount of carbohydrates.A serving of most carbohydrate-rich foods contains about 15 g of carbohydrates. The following list includes serving sizes of carbohydrate-rich foods that provide 15 g ofcarbohydrate per serving:   1 slice of bread  (1 oz) or 1 six-inch tortilla.    of a hamburger bun or English muffin.  4-6 crackers.   cup unsweetened dry cereal.    cup hot cereal.   cup rice or pasta.    cup mashed potatoes or  of a large baked potato.  1 cup fresh fruit or one small piece of fruit.    cup canned or frozen fruit or fruit juice.  1 cup milk.   cup plain fat-free yogurt or yogurt sweetened with artificial sweeteners.   cup cooked dried beans or starchy vegetable, such as peas, corn, or potatoes.  Decide the number of standard-size servings that you will eat. Multiply that number of servings by 15 (the grams of carbohydrates in that serving). For example, if you eat 2 cups of strawberries, you will have eaten 2 servings and 30 g of carbohydrates (2 servings x 15 g = 30 g). For foods such as soups and casseroles, in which more than one food is mixed in, you will need to count the carbohydrates in each food that is included. EXAMPLE OF CARBOHYDRATE COUNTING Sample Dinner  3 oz chicken breast.   cup of brown rice.   cup of corn.  1 cup milk.   1 cup strawberries with sugar-free whipped topping.  Carbohydrate Calculation Step 1: Identify the foods that contain carbohydrates:   Rice.   Corn.     Milk.   Strawberries. Step 2:Calculate the number of servings eaten of each:   2 servings of rice.   1 serving of corn.   1 serving of milk.   1 serving of strawberries. Step 3: Multiply each of those number of servings by 15 g:   2 servings of rice x 15 g = 30 g.   1 serving of corn x 15 g = 15 g.   1 serving of milk x 15 g = 15 g.   1 serving of strawberries x 15 g = 15 g. Step 4: Add together all of the amounts to find the total grams of carbohydrates eaten: 30 g + 15 g + 15 g + 15 g = 75 g. Document Released: 09/28/2005 Document Revised: 02/12/2014 Document Reviewed: 08/25/2013 Exit[employer] Patient Information 2015 [employer]. This information is not intended to  replace advice given to you by your health care provider. Make sure you discuss any questions you have with your health care provider.  

## 2014-11-15 NOTE — Progress Notes (Signed)
Patient is here for follow-up from ED visit for left flank pain.  Patient states she is still having the pain is better today then it was.  She states the pain starts on the left side at her groin area and radiates up and around to her back and down her left leg.   Pt is also asking for a referral to a dentist.

## 2014-11-16 LAB — CBC WITH DIFFERENTIAL/PLATELET
Basophils Absolute: 0.1 10*3/uL (ref 0.0–0.1)
Basophils Relative: 1 % (ref 0–1)
EOS ABS: 0.1 10*3/uL (ref 0.0–0.7)
EOS PCT: 2 % (ref 0–5)
HEMATOCRIT: 32.7 % — AB (ref 36.0–46.0)
Hemoglobin: 10.9 g/dL — ABNORMAL LOW (ref 12.0–15.0)
LYMPHS PCT: 46 % (ref 12–46)
Lymphs Abs: 3.4 10*3/uL (ref 0.7–4.0)
MCH: 29.1 pg (ref 26.0–34.0)
MCHC: 33.3 g/dL (ref 30.0–36.0)
MCV: 87.4 fL (ref 78.0–100.0)
MONO ABS: 0.3 10*3/uL (ref 0.1–1.0)
MONOS PCT: 4 % (ref 3–12)
MPV: 13.6 fL — AB (ref 8.6–12.4)
NEUTROS ABS: 3.5 10*3/uL (ref 1.7–7.7)
NEUTROS PCT: 47 % (ref 43–77)
Platelets: 265 10*3/uL (ref 150–400)
RBC: 3.74 MIL/uL — ABNORMAL LOW (ref 3.87–5.11)
RDW: 14.8 % (ref 11.5–15.5)
WBC: 7.4 10*3/uL (ref 4.0–10.5)

## 2014-11-16 LAB — MICROALBUMIN / CREATININE URINE RATIO
CREATININE, URINE: 144.1 mg/dL
MICROALB/CREAT RATIO: 153.4 mg/g — AB (ref 0.0–30.0)
Microalb, Ur: 22.1 mg/dL — ABNORMAL HIGH (ref <2.0)

## 2014-11-16 LAB — VITAMIN D 25 HYDROXY (VIT D DEFICIENCY, FRACTURES): VIT D 25 HYDROXY: 18 ng/mL — AB (ref 30–100)

## 2014-11-19 ENCOUNTER — Other Ambulatory Visit: Payer: Self-pay | Admitting: Internal Medicine

## 2014-12-03 ENCOUNTER — Other Ambulatory Visit (HOSPITAL_BASED_OUTPATIENT_CLINIC_OR_DEPARTMENT_OTHER): Payer: Self-pay | Admitting: *Deleted

## 2014-12-03 VITALS — BP 146/87 | HR 87 | Temp 97.7°F | Resp 18

## 2014-12-03 DIAGNOSIS — E1165 Type 2 diabetes mellitus with hyperglycemia: Secondary | ICD-10-CM

## 2014-12-03 LAB — POCT URINALYSIS DIPSTICK
BILIRUBIN UA: NEGATIVE
Blood, UA: NEGATIVE
Glucose, UA: 500
Ketones, UA: NEGATIVE
Leukocytes, UA: NEGATIVE
NITRITE UA: NEGATIVE
Protein, UA: 100
Spec Grav, UA: 1.015
Urobilinogen, UA: 0.2
pH, UA: 5.5

## 2014-12-03 LAB — GLUCOSE, POCT (MANUAL RESULT ENTRY): POC Glucose: 400 mg/dl — AB (ref 70–99)

## 2014-12-03 LAB — POCT CBG (FASTING - GLUCOSE)-MANUAL ENTRY: GLUCOSE FASTING, POC: 411 mg/dL — AB (ref 70–99)

## 2014-12-03 MED ORDER — FENOFIBRATE 145 MG PO TABS: 145.0000 mg | ORAL_TABLET | Freq: Every day | ORAL | Status: DC

## 2014-12-03 MED ORDER — INSULIN ASPART 100 UNIT/ML ~~LOC~~ SOLN
20.0000 [IU] | Freq: Once | SUBCUTANEOUS | Status: AC
  Administered 2014-12-03: 20 [IU] via SUBCUTANEOUS

## 2014-12-03 MED ORDER — SITAGLIPTIN PHOSPHATE 100 MG PO TABS: 100.0000 mg | ORAL_TABLET | Freq: Every day | ORAL | Status: DC

## 2014-12-03 MED ORDER — VITAMIN D (ERGOCALCIFEROL) 1.25 MG (50000 UNIT) PO CAPS: 50000.0000 [IU] | ORAL_CAPSULE | ORAL | Status: DC

## 2014-12-03 NOTE — Patient Instructions (Signed)
Fat and Cholesterol Control Diet Fat and cholesterol levels in your blood and organs are influenced by your diet. High levels of fat and cholesterol may lead to diseases of the heart, small and large blood vessels, gallbladder, liver, and pancreas. CONTROLLING FAT AND CHOLESTEROL WITH DIET Although exercise and lifestyle factors are important, your diet is key. That is because certain foods are known to raise cholesterol and others to lower it. The goal is to balance foods for their effect on cholesterol and more importantly, to replace saturated and trans fat with other types of fat, such as monounsaturated fat, polyunsaturated fat, and omega-3 fatty acids. On average, a person should consume no more than 15 to 17 g of saturated fat daily. Saturated and trans fats are considered "bad" fats, and they will raise LDL cholesterol. Saturated fats are primarily found in animal products such as meats, butter, and cream. However, that does not mean you need to give up all your favorite foods. Today, there are good tasting, low-fat, low-cholesterol substitutes for most of the things you like to eat. Choose low-fat or nonfat alternatives. Choose round or loin cuts of red meat. These types of cuts are lowest in fat and cholesterol. Chicken (without the skin), fish, veal, and ground turkey breast are great choices. Eliminate fatty meats, such as hot dogs and salami. Even shellfish have little or no saturated fat. Have a 3 oz (85 g) portion when you eat lean meat, poultry, or fish. Trans fats are also called "partially hydrogenated oils." They are oils that have been scientifically manipulated so that they are solid at room temperature resulting in a longer shelf life and improved taste and texture of foods in which they are added. Trans fats are found in stick margarine, some tub margarines, cookies, crackers, and baked goods.  When baking and cooking, oils are a great substitute for butter. The monounsaturated oils are  especially beneficial since it is believed they lower LDL and raise HDL. The oils you should avoid entirely are saturated tropical oils, such as coconut and palm.  Remember to eat a lot from food groups that are naturally free of saturated and trans fat, including fish, fruit, vegetables, beans, grains (barley, rice, couscous, bulgur wheat), and pasta (without cream sauces).  IDENTIFYING FOODS THAT LOWER FAT AND CHOLESTEROL  Soluble fiber may lower your cholesterol. This type of fiber is found in fruits such as apples, vegetables such as broccoli, potatoes, and carrots, legumes such as beans, peas, and lentils, and grains such as barley. Foods fortified with plant sterols (phytosterol) may also lower cholesterol. You should eat at least 2 g per day of these foods for a cholesterol lowering effect.  Read package labels to identify low-saturated fats, trans fat free, and low-fat foods at the supermarket. Select cheeses that have only 2 to 3 g saturated fat per ounce. Use a heart-healthy tub margarine that is free of trans fats or partially hydrogenated oil. When buying baked goods (cookies, crackers), avoid partially hydrogenated oils. Breads and muffins should be made from whole grains (whole-wheat or whole oat flour, instead of "flour" or "enriched flour"). Buy non-creamy canned soups with reduced salt and no added fats.  FOOD PREPARATION TECHNIQUES  Never deep-fry. If you must fry, either stir-fry, which uses very little fat, or use non-stick cooking sprays. When possible, broil, bake, or roast meats, and steam vegetables. Instead of putting butter or margarine on vegetables, use lemon and herbs, applesauce, and cinnamon (for squash and sweet potatoes). Use nonfat   yogurt, salsa, and low-fat dressings for salads.  LOW-SATURATED FAT / LOW-FAT FOOD SUBSTITUTES Meats / Saturated Fat (g)  Avoid: Steak, marbled (3 oz/85 g) / 11 g  Choose: Steak, lean (3 oz/85 g) / 4 g  Avoid: Hamburger (3 oz/85 g) / 7  g  Choose: Hamburger, lean (3 oz/85 g) / 5 g  Avoid: Ham (3 oz/85 g) / 6 g  Choose: Ham, lean cut (3 oz/85 g) / 2.4 g  Avoid: Chicken, with skin, dark meat (3 oz/85 g) / 4 g  Choose: Chicken, skin removed, dark meat (3 oz/85 g) / 2 g  Avoid: Chicken, with skin, light meat (3 oz/85 g) / 2.5 g  Choose: Chicken, skin removed, light meat (3 oz/85 g) / 1 g Dairy / Saturated Fat (g)  Avoid: Whole milk (1 cup) / 5 g  Choose: Low-fat milk, 2% (1 cup) / 3 g  Choose: Low-fat milk, 1% (1 cup) / 1.5 g  Choose: Skim milk (1 cup) / 0.3 g  Avoid: Hard cheese (1 oz/28 g) / 6 g  Choose: Skim milk cheese (1 oz/28 g) / 2 to 3 g  Avoid: Cottage cheese, 4% fat (1 cup) / 6.5 g  Choose: Low-fat cottage cheese, 1% fat (1 cup) / 1.5 g  Avoid: Ice cream (1 cup) / 9 g  Choose: Sherbet (1 cup) / 2.5 g  Choose: Nonfat frozen yogurt (1 cup) / 0.3 g  Choose: Frozen fruit bar / trace  Avoid: Whipped cream (1 tbs) / 3.5 g  Choose: Nondairy whipped topping (1 tbs) / 1 g Condiments / Saturated Fat (g)  Avoid: Mayonnaise (1 tbs) / 2 g  Choose: Low-fat mayonnaise (1 tbs) / 1 g  Avoid: Butter (1 tbs) / 7 g  Choose: Extra light margarine (1 tbs) / 1 g  Avoid: Coconut oil (1 tbs) / 11.8 g  Choose: Olive oil (1 tbs) / 1.8 g  Choose: Corn oil (1 tbs) / 1.7 g  Choose: Safflower oil (1 tbs) / 1.2 g  Choose: Sunflower oil (1 tbs) / 1.4 g  Choose: Soybean oil (1 tbs) / 2.4 g  Choose: Canola oil (1 tbs) / 1 g Document Released: 09/28/2005 Document Revised: 01/23/2013 Document Reviewed: 12/27/2013 [employer] Patient Information 2015 [place], [place]. This information is not intended to replace advice given to you by your health care provider. Make sure you discuss any questions you have with your health care provider. Diabetes Mellitus and Food It is important for you to manage your blood sugar (glucose) level. Your blood glucose level can be greatly affected by what you eat. Eating healthier  foods in the appropriate amounts throughout the day at about the same time each day will help you control your blood glucose level. It can also help slow or prevent worsening of your diabetes mellitus. Healthy eating may even help you improve the level of your blood pressure and reach or maintain a healthy weight.  HOW CAN FOOD AFFECT ME? Carbohydrates Carbohydrates affect your blood glucose level more than any other type of food. Your dietitian will help you determine how many carbohydrates to eat at each meal and teach you how to count carbohydrates. Counting carbohydrates is important to keep your blood glucose at a healthy level, especially if you are using insulin or taking certain medicines for diabetes mellitus. Alcohol Alcohol can cause sudden decreases in blood glucose (hypoglycemia), especially if you use insulin or take certain medicines for diabetes mellitus. Hypoglycemia can be a life-threatening  condition. Symptoms of hypoglycemia (sleepiness, dizziness, and disorientation) are similar to symptoms of having too much alcohol.  If your health care provider has given you approval to drink alcohol, do so in moderation and use the following guidelines:  Women should not have more than one drink per day, and men should not have more than two drinks per day. One drink is equal to:  12 oz of beer.  5 oz of wine.  1 oz of hard liquor.  Do not drink on an empty stomach.  Keep yourself hydrated. Have water, diet soda, or unsweetened iced tea.  Regular soda, juice, and other mixers might contain a lot of carbohydrates and should be counted. WHAT FOODS ARE NOT RECOMMENDED? As you make food choices, it is important to remember that all foods are not the same. Some foods have fewer nutrients per serving than other foods, even though they might have the same number of calories or carbohydrates. It is difficult to get your body what it needs when you eat foods with fewer nutrients. Examples of  foods that you should avoid that are high in calories and carbohydrates but low in nutrients include:  Trans fats (most processed foods list trans fats on the Nutrition Facts label).  Regular soda.  Juice.  Candy.  Sweets, such as cake, pie, doughnuts, and cookies.  Fried foods. WHAT FOODS CAN I EAT? Have nutrient-rich foods, which will nourish your body and keep you healthy. The food you should eat also will depend on several factors, including:  The calories you need.  The medicines you take.  Your weight.  Your blood glucose level.  Your blood pressure level.  Your cholesterol level. You also should eat a variety of foods, including:  Protein, such as meat, poultry, fish, tofu, nuts, and seeds (lean animal proteins are best).  Fruits.  Vegetables.  Dairy products, such as milk, cheese, and yogurt (low fat is best).  Breads, grains, pasta, cereal, rice, and beans.  Fats such as olive oil, trans fat-free margarine, canola oil, avocado, and olives. DOES EVERYONE WITH DIABETES MELLITUS HAVE THE SAME MEAL PLAN? Because every person with diabetes mellitus is different, there is not one meal plan that works for everyone. It is very important that you meet with a dietitian who will help you create a meal plan that is just right for you. Document Released: 06/25/2005 Document Revised: 10/03/2013 Document Reviewed: 08/25/2013 [facility] Patient Information 2015 [place], [place]. This information is not intended to replace advice given to you by your health care provider. Make sure you discuss any questions you have with your health care provider. Diabetes and Exercise Exercising regularly is important. It is not just about losing weight. It has many health benefits, such as:  Improving your overall fitness, flexibility, and endurance.  Increasing your bone density.  Helping with weight control.  Decreasing your body fat.  Increasing your muscle strength.  Reducing  stress and tension.  Improving your overall health. People with diabetes who exercise gain additional benefits because exercise:  Reduces appetite.  Improves the body's use of blood sugar (glucose).  Helps lower or control blood glucose.  Decreases blood pressure.  Helps control blood lipids (such as cholesterol and triglycerides).  Improves the body's use of the hormone insulin by:  Increasing the body's insulin sensitivity.  Reducing the body's insulin needs.  Decreases the risk for heart disease because exercising:  Lowers cholesterol and triglycerides levels.  Increases the levels of good cholesterol (such as high-density lipoproteins [  HDL]) in the body.  Lowers blood glucose levels. YOUR ACTIVITY PLAN  Choose an activity that you enjoy and set realistic goals. Your health care provider or diabetes educator can help you make an activity plan that works for you. Exercise regularly as directed by your health care provider. This includes:  Performing resistance training twice a week such as push-ups, sit-ups, lifting weights, or using resistance bands.  Performing 150 minutes of cardio exercises each week such as walking, running, or playing sports.  Staying active and spending no more than 90 minutes at one time being inactive. Even short bursts of exercise are good for you. Three 10-minute sessions spread throughout the day are just as beneficial as a single 30-minute session. Some exercise ideas include:  Taking the dog for a walk.  Taking the stairs instead of the elevator.  Dancing to your favorite song.  Doing an exercise video.  Doing your favorite exercise with a friend. RECOMMENDATIONS FOR EXERCISING WITH TYPE 1 OR TYPE 2 DIABETES   Check your blood glucose before exercising. If blood glucose levels are greater than 240 mg/dL, check for urine ketones. Do not exercise if ketones are present.  Avoid injecting insulin into areas of the body that are going to  be exercised. For example, avoid injecting insulin into:  The arms when playing tennis.  The legs when jogging.  Keep a record of:  Food intake before and after you exercise.  Expected peak times of insulin action.  Blood glucose levels before and after you exercise.  The type and amount of exercise you have done.  Review your records with your health care provider. Your health care provider will help you to develop guidelines for adjusting food intake and insulin amounts before and after exercising.  If you take insulin or oral hypoglycemic agents, watch for signs and symptoms of hypoglycemia. They include:  Dizziness.  Shaking.  Sweating.  Chills.  Confusion.  Drink plenty of water while you exercise to prevent dehydration or heat stroke. Body water is lost during exercise and must be replaced.  Talk to your health care provider before starting an exercise program to make sure it is safe for you. Remember, almost any type of activity is better than none. Document Released: 12/19/2003 Document Revised: 02/12/2014 Document Reviewed: 03/07/2013 [facility] Patient Information 2015 [place], [place]. This information is not intended to replace advice given to you by your health care provider. Make sure you discuss any questions you have with your health care provider. Basic Carbohydrate Counting for Diabetes Mellitus Carbohydrate counting is a method for keeping track of the amount of carbohydrates you eat. Eating carbohydrates naturally increases the level of sugar (glucose) in your blood, so it is important for you to know the amount that is okay for you to have in every meal. Carbohydrate counting helps keep the level of glucose in your blood within normal limits. The amount of carbohydrates allowed is different for every person. A dietitian can help you calculate the amount that is right for you. Once you know the amount of carbohydrates you can have, you can count the carbohydrates  in the foods you want to eat. Carbohydrates are found in the following foods:  Grains, such as breads and cereals.  Dried beans and soy products.  Starchy vegetables, such as potatoes, peas, and corn.  Fruit and fruit juices.  Milk and yogurt.  Sweets and snack foods, such as cake, cookies, candy, chips, soft drinks, and fruit drinks. CARBOHYDRATE COUNTING There are  two ways to count the carbohydrates in your food. You can use either of the methods or a combination of both. Reading the "Nutrition Facts" on [place] The "Nutrition Facts" is an area that is included on the labels of almost all packaged food and beverages in the [place]. It includes the serving size of that food or beverage and information about the nutrients in each serving of the food, including the grams (g) of carbohydrate per serving.  Decide the number of servings of this food or beverage that you will be able to eat or drink. Multiply that number of servings by the number of grams of carbohydrate that is listed on the label for that serving. The total will be the amount of carbohydrates you will be having when you eat or drink this food or beverage. Learning Standard Serving Sizes of Food When you eat food that is not packaged or does not include "Nutrition Facts" on the label, you need to measure the servings in order to count the amount of carbohydrates.A serving of most carbohydrate-rich foods contains about 15 g of carbohydrates. The following list includes serving sizes of carbohydrate-rich foods that provide 15 g ofcarbohydrate per serving:   1 slice of bread (1 oz) or 1 six-inch tortilla.    of a hamburger bun or English muffin.  4-6 crackers.   cup unsweetened dry cereal.    cup hot cereal.   cup rice or pasta.    cup mashed potatoes or  of a large baked potato.  1 cup fresh fruit or one small piece of fruit.    cup canned or frozen fruit or fruit juice.  1 cup milk.   cup  plain fat-free yogurt or yogurt sweetened with artificial sweeteners.   cup cooked dried beans or starchy vegetable, such as peas, corn, or potatoes.  Decide the number of standard-size servings that you will eat. Multiply that number of servings by 15 (the grams of carbohydrates in that serving). For example, if you eat 2 cups of strawberries, you will have eaten 2 servings and 30 g of carbohydrates (2 servings x 15 g = 30 g). For foods such as soups and casseroles, in which more than one food is mixed in, you will need to count the carbohydrates in each food that is included. EXAMPLE OF CARBOHYDRATE COUNTING Sample Dinner  3 oz chicken breast.   cup of brown rice.   cup of corn.  1 cup milk.   1 cup strawberries with sugar-free whipped topping.  Carbohydrate Calculation Step 1: Identify the foods that contain carbohydrates:   Rice.   Corn.   Milk.   Strawberries. Step 2:Calculate the number of servings eaten of each:   2 servings of rice.   1 serving of corn.   1 serving of milk.   1 serving of strawberries. Step 3: Multiply each of those number of servings by 15 g:   2 servings of rice x 15 g = 30 g.   1 serving of corn x 15 g = 15 g.   1 serving of milk x 15 g = 15 g.   1 serving of strawberries x 15 g = 15 g. Step 4: Add together all of the amounts to find the total grams of carbohydrates eaten: 30 g + 15 g + 15 g + 15 g = 75 g. Document Released: 09/28/2005 Document Revised: 02/12/2014 Document Reviewed: 08/25/2013 [facility] Patient Information 2015 [place], [place]. This information is not intended to  replace advice given to you by your health care provider. Make sure you discuss any questions you have with your health care provider. DASH Eating Plan DASH stands for "Dietary Approaches to Stop Hypertension." The DASH eating plan is a healthy eating plan that has been shown to reduce high blood pressure (hypertension). Additional health benefits  may include reducing the risk of type 2 diabetes mellitus, heart disease, and stroke. The DASH eating plan may also help with weight loss. WHAT DO I NEED TO KNOW ABOUT THE DASH EATING PLAN? For the DASH eating plan, you will follow these general guidelines:  Choose foods with a percent daily value for sodium of less than 5% (as listed on the food label).  Use salt-free seasonings or herbs instead of table salt or sea salt.  Check with your health care provider or pharmacist before using salt substitutes.  Eat lower-sodium products, often labeled as "lower sodium" or "no salt added."  Eat fresh foods.  Eat more vegetables, fruits, and low-fat dairy products.  Choose whole grains. Look for the word "whole" as the first word in the ingredient list.  Choose fish and skinless chicken or [place] more often than red meat. Limit fish, poultry, and meat to 6 oz (170 g) each day.  Limit sweets, desserts, sugars, and sugary drinks.  Choose heart-healthy fats.  Limit cheese to 1 oz (28 g) per day.  Eat more home-cooked food and less restaurant, buffet, and fast food.  Limit fried foods.  Cook foods using methods other than frying.  Limit canned vegetables. If you do use them, rinse them well to decrease the sodium.  When eating at a restaurant, ask that your food be prepared with less salt, or no salt if possible. WHAT FOODS CAN I EAT? Seek help from a dietitian for individual calorie needs. Grains Whole grain or whole wheat bread. Brown rice. Whole grain or whole wheat pasta. Quinoa, bulgur, and whole grain cereals. Low-sodium cereals. Corn or whole wheat flour tortillas. Whole grain cornbread. Whole grain crackers. Low-sodium crackers. Vegetables Fresh or frozen vegetables (raw, steamed, roasted, or grilled). Low-sodium or reduced-sodium tomato and vegetable juices. Low-sodium or reduced-sodium tomato sauce and paste. Low-sodium or reduced-sodium canned vegetables.  Fruits All fresh,  canned (in natural juice), or frozen fruits. Meat and Other Protein Products Ground beef (85% or leaner), grass-fed beef, or beef trimmed of fat. Skinless chicken or [place]. Ground chicken or [place]. Pork trimmed of fat. All fish and seafood. Eggs. Dried beans, peas, or lentils. Unsalted nuts and seeds. Unsalted canned beans. Dairy Low-fat dairy products, such as skim or 1% milk, 2% or reduced-fat cheeses, low-fat ricotta or cottage cheese, or plain low-fat yogurt. Low-sodium or reduced-sodium cheeses. Fats and Oils Tub margarines without trans fats. Light or reduced-fat mayonnaise and salad dressings (reduced sodium). Avocado. Safflower, olive, or canola oils. Natural peanut or almond butter. Other Unsalted popcorn and pretzels. The items listed above may not be a complete list of recommended foods or beverages. Contact your dietitian for more options. WHAT FOODS ARE NOT RECOMMENDED? Grains White bread. White pasta. White rice. Refined cornbread. Bagels and croissants. Crackers that contain trans fat. Vegetables Creamed or fried vegetables. Vegetables in a cheese sauce. Regular canned vegetables. Regular canned tomato sauce and paste. Regular tomato and vegetable juices. Fruits Dried fruits. Canned fruit in light or heavy syrup. Fruit juice. Meat and Other Protein Products Fatty cuts of meat. Ribs, chicken wings, bacon, sausage, bologna, salami, chitterlings, fatback, hot dogs, bratwurst, and packaged luncheon  meats. Salted nuts and seeds. Canned beans with salt. Dairy Whole or 2% milk, cream, half-and-half, and cream cheese. Whole-fat or sweetened yogurt. Full-fat cheeses or blue cheese. Nondairy creamers and whipped toppings. Processed cheese, cheese spreads, or cheese curds. Condiments Onion and garlic salt, seasoned salt, table salt, and sea salt. Canned and packaged gravies. Worcestershire sauce. Tartar sauce. Barbecue sauce. Teriyaki sauce. Soy sauce, including reduced sodium. Steak  sauce. Fish sauce. Oyster sauce. Cocktail sauce. Horseradish. Ketchup and mustard. Meat flavorings and tenderizers. Bouillon cubes. Hot sauce. Tabasco sauce. Marinades. Taco seasonings. Relishes. Fats and Oils Butter, stick margarine, lard, shortening, ghee, and bacon fat. Coconut, palm kernel, or palm oils. Regular salad dressings. Other Pickles and olives. Salted popcorn and pretzels. The items listed above may not be a complete list of foods and beverages to avoid. Contact your dietitian for more information. WHERE CAN I FIND MORE INFORMATION? National [employer], [employer], [employer]: travelstabloid.com Document Released: 09/17/2011 Document Revised: 02/12/2014 Document Reviewed: 08/02/2013 [facility] Patient Information 2015 [place], [place]. This information is not intended to replace advice given to you by your health care provider. Make sure you discuss any questions you have with your health care provider.

## 2014-12-03 NOTE — Progress Notes (Signed)
Patient presents for CBG and record review, however, patient did not bring log or meter Med list reviewed; patient reports taking all meds as directed, however, was running late this AM and did not take morning meds States she was on a partial fast last week for church. Ended 2 days ago Discussed need to eat regularly and take meds as directed Patient reports AM fasting blood sugars ranging 189-200 Patient reports before bed blood sugars ranging 175-230 Patient given BS log and instructed on use. Patient instructed to bring log to every visit Labs from last OV reviewed. RXs e-scribed to [facility] with PCP instructions printed and given to patient  CBG 411 Patient states she feels well. Denies s/sx of hyperglycemia  U/A: glucose 500 Ketones Negative  Protein 100  BP 146/87 P 87 R 18 T  97.7 oral SpO2 97%  20 units novolog insulin given SQ per protocol  Per PCP: Increase Januvia to 100 mg daily  Return in 2 weeks for nurse visit for CBG and record review  Patient given literature on DASH Eating Plan Fat and Cholesterol Control Diet Diabetes and Food, Diabetes and Exercise, Basic Carb Counting  Patient advised to call for med refills at least 7 days before running out so as not to go without.  CBG 400 40 minutes after insulin admin. Patient discharged to home in stable condition.

## 2014-12-04 ENCOUNTER — Ambulatory Visit: Payer: Self-pay | Attending: Internal Medicine

## 2014-12-14 ENCOUNTER — Other Ambulatory Visit: Payer: Self-pay | Admitting: Internal Medicine

## 2014-12-14 DIAGNOSIS — E119 Type 2 diabetes mellitus without complications: Secondary | ICD-10-CM

## 2014-12-14 MED ORDER — SITAGLIPTIN PHOSPHATE 100 MG PO TABS: 100.0000 mg | ORAL_TABLET | Freq: Every day | ORAL | Status: DC

## 2014-12-20 ENCOUNTER — Ambulatory Visit: Payer: Self-pay | Attending: Internal Medicine

## 2014-12-20 ENCOUNTER — Other Ambulatory Visit: Payer: Self-pay

## 2014-12-20 VITALS — BP 158/73 | HR 85 | Temp 98.7°F | Resp 16

## 2014-12-20 DIAGNOSIS — E119 Type 2 diabetes mellitus without complications: Secondary | ICD-10-CM | POA: Insufficient documentation

## 2014-12-20 DIAGNOSIS — R739 Hyperglycemia, unspecified: Secondary | ICD-10-CM | POA: Insufficient documentation

## 2014-12-20 LAB — POCT URINALYSIS DIPSTICK
Bilirubin, UA: NEGATIVE
Blood, UA: NEGATIVE
Glucose, UA: 500
KETONES UA: NEGATIVE
LEUKOCYTES UA: NEGATIVE
Nitrite, UA: NEGATIVE
PH UA: 6.5
Protein, UA: NEGATIVE
Spec Grav, UA: 1.015
Urobilinogen, UA: 0.2

## 2014-12-20 LAB — GLUCOSE, POCT (MANUAL RESULT ENTRY)
POC Glucose: 490 mg/dl — AB (ref 70–99)
POC Glucose: 541 mg/dl — AB (ref 70–99)

## 2014-12-20 MED ORDER — INSULIN ASPART 100 UNIT/ML ~~LOC~~ SOLN
20.0000 [IU] | Freq: Once | SUBCUTANEOUS | Status: AC
  Administered 2014-12-20: 20 [IU] via SUBCUTANEOUS

## 2014-12-20 NOTE — Patient Instructions (Signed)
[facility] [place] will give you #30 day supply of Januvia until approved  Take medication everyday and check fasting blood sugar/record Testing supplies reordered and e-scribed to pharmacy  CBG recheck- 490   Per Dr. Doreene Burke,  Take medication and return in 2 weeks with CBg log

## 2014-12-20 NOTE — Progress Notes (Signed)
Pt comes in today for CBG/log check after Januvia increased to 100 mg tab per last OV Pt states she ran out of medication yesterday;waiting on PASS approval States she ran out of testing supplies as well According to log book, CBg's have been high in 300's C/o blurry vision today CBG- 541,ate chicken biscuit at fast food 20 units Novolog coverage given per protocol, Urine dipstick obtained for possible hydration.

## 2015-01-01 ENCOUNTER — Other Ambulatory Visit: Payer: Self-pay | Admitting: Internal Medicine

## 2015-01-08 ENCOUNTER — Other Ambulatory Visit: Payer: Self-pay

## 2015-01-28 ENCOUNTER — Ambulatory Visit: Payer: Self-pay | Attending: Internal Medicine | Admitting: *Deleted

## 2015-01-28 VITALS — BP 135/77 | HR 80 | Temp 97.8°F | Resp 18

## 2015-01-28 DIAGNOSIS — E1165 Type 2 diabetes mellitus with hyperglycemia: Secondary | ICD-10-CM | POA: Insufficient documentation

## 2015-01-28 DIAGNOSIS — Z794 Long term (current) use of insulin: Secondary | ICD-10-CM | POA: Insufficient documentation

## 2015-01-28 LAB — POCT URINALYSIS DIPSTICK
Bilirubin, UA: NEGATIVE
Blood, UA: NEGATIVE
Glucose, UA: 500
KETONES UA: NEGATIVE
Leukocytes, UA: NEGATIVE
Nitrite, UA: NEGATIVE
PH UA: 5.5
PROTEIN UA: 30
Spec Grav, UA: 1.01
Urobilinogen, UA: 0.2

## 2015-01-28 LAB — GLUCOSE, POCT (MANUAL RESULT ENTRY)
POC Glucose: 414 mg/dl — AB (ref 70–99)
POC Glucose: 357 mg/dl — AB (ref 70–99)

## 2015-01-28 LAB — POCT GLYCOSYLATED HEMOGLOBIN (HGB A1C): Hemoglobin A1C: >15

## 2015-01-28 MED ORDER — INSULIN ASPART 100 UNIT/ML ~~LOC~~ SOLN
20.0000 [IU] | Freq: Once | SUBCUTANEOUS | Status: AC
Start: 2015-01-28 — End: 2015-01-28
  Administered 2015-01-28: 20 [IU] via SUBCUTANEOUS

## 2015-01-28 MED ORDER — INSULIN PEN NEEDLE 29G X 12.7MM MISC: 10.0000 [IU] | Status: DC

## 2015-01-28 MED ORDER — INSULIN GLARGINE 100 UNIT/ML SOLOSTAR PEN: 10.0000 [IU] | PEN_INJECTOR | Freq: Every day | SUBCUTANEOUS | Status: DC

## 2015-01-28 NOTE — Patient Instructions (Addendum)
Diabetes Mellitus and Food It is important for you to manage your blood sugar (glucose) level. Your blood glucose level can be greatly affected by what you eat. Eating healthier foods in the appropriate amounts throughout the day at about the same time each day will help you control your blood glucose level. It can also help slow or prevent worsening of your diabetes mellitus. Healthy eating may even help you improve the level of your blood pressure and reach or maintain a healthy weight.  HOW CAN FOOD AFFECT ME? Carbohydrates Carbohydrates affect your blood glucose level more than any other type of food. Your dietitian will help you determine how many carbohydrates to eat at each meal and teach you how to count carbohydrates. Counting carbohydrates is important to keep your blood glucose at a healthy level, especially if you are using insulin or taking certain medicines for diabetes mellitus. Alcohol Alcohol can cause sudden decreases in blood glucose (hypoglycemia), especially if you use insulin or take certain medicines for diabetes mellitus. Hypoglycemia can be a life-threatening condition. Symptoms of hypoglycemia (sleepiness, dizziness, and disorientation) are similar to symptoms of having too much alcohol.  If your health care provider has given you approval to drink alcohol, do so in moderation and use the following guidelines:  Women should not have more than one drink per day, and men should not have more than two drinks per day. One drink is equal to:  12 oz of beer.  5 oz of wine.  1 oz of hard liquor.  Do not drink on an empty stomach.  Keep yourself hydrated. Have water, diet soda, or unsweetened iced tea.  Regular soda, juice, and other mixers might contain a lot of carbohydrates and should be counted. WHAT FOODS ARE NOT RECOMMENDED? As you make food choices, it is important to remember that all foods are not the same. Some foods have fewer nutrients per serving than other  foods, even though they might have the same number of calories or carbohydrates. It is difficult to get your body what it needs when you eat foods with fewer nutrients. Examples of foods that you should avoid that are high in calories and carbohydrates but low in nutrients include:  Trans fats (most processed foods list trans fats on the Nutrition Facts label).  Regular soda.  Juice.  Candy.  Sweets, such as cake, pie, doughnuts, and cookies.  Fried foods. WHAT FOODS CAN I EAT? Have nutrient-rich foods, which will nourish your body and keep you healthy. The food you should eat also will depend on several factors, including:  The calories you need.  The medicines you take.  Your weight.  Your blood glucose level.  Your blood pressure level.  Your cholesterol level. You also should eat a variety of foods, including:  Protein, such as meat, poultry, fish, tofu, nuts, and seeds (lean animal proteins are best).  Fruits.  Vegetables.  Dairy products, such as milk, cheese, and yogurt (low fat is best).  Breads, grains, pasta, cereal, rice, and beans.  Fats such as olive oil, trans fat-free margarine, canola oil, avocado, and olives. DOES EVERYONE WITH DIABETES MELLITUS HAVE THE SAME MEAL PLAN? Because every person with diabetes mellitus is different, there is not one meal plan that works for everyone. It is very important that you meet with a dietitian who will help you create a meal plan that is just right for you. Document Released: 06/25/2005 Document Revised: 10/03/2013 Document Reviewed: 08/25/2013 [employer] Patient Information 2015 [employer]. This   information is not intended to replace advice given to you by your health care provider. Make sure you discuss any questions you have with your health care provider. Diabetes and Exercise Exercising regularly is important. It is not just about losing weight. It has many health benefits, such as:  Improving your overall  fitness, flexibility, and endurance.  Increasing your bone density.  Helping with weight control.  Decreasing your body fat.  Increasing your muscle strength.  Reducing stress and tension.  Improving your overall health. People with diabetes who exercise gain additional benefits because exercise:  Reduces appetite.  Improves the body's use of blood sugar (glucose).  Helps lower or control blood glucose.  Decreases blood pressure.  Helps control blood lipids (such as cholesterol and triglycerides).  Improves the body's use of the hormone insulin by:  Increasing the body's insulin sensitivity.  Reducing the body's insulin needs.  Decreases the risk for heart disease because exercising:  Lowers cholesterol and triglycerides levels.  Increases the levels of good cholesterol (such as high-density lipoproteins [HDL]) in the body.  Lowers blood glucose levels. YOUR ACTIVITY PLAN  Choose an activity that you enjoy and set realistic goals. Your health care provider or diabetes educator can help you make an activity plan that works for you. Exercise regularly as directed by your health care provider. This includes:  Performing resistance training twice a week such as push-ups, sit-ups, lifting weights, or using resistance bands.  Performing 150 minutes of cardio exercises each week such as walking, running, or playing sports.  Staying active and spending no more than 90 minutes at one time being inactive. Even short bursts of exercise are good for you. Three 10-minute sessions spread throughout the day are just as beneficial as a single 30-minute session. Some exercise ideas include:  Taking the dog for a walk.  Taking the stairs instead of the elevator.  Dancing to your favorite song.  Doing an exercise video.  Doing your favorite exercise with a friend. RECOMMENDATIONS FOR EXERCISING WITH TYPE 1 OR TYPE 2 DIABETES   Check your blood glucose before exercising. If  blood glucose levels are greater than 240 mg/dL, check for urine ketones. Do not exercise if ketones are present.  Avoid injecting insulin into areas of the body that are going to be exercised. For example, avoid injecting insulin into:  The arms when playing tennis.  The legs when jogging.  Keep a record of:  Food intake before and after you exercise.  Expected peak times of insulin action.  Blood glucose levels before and after you exercise.  The type and amount of exercise you have done.  Review your records with your health care provider. Your health care provider will help you to develop guidelines for adjusting food intake and insulin amounts before and after exercising.  If you take insulin or oral hypoglycemic agents, watch for signs and symptoms of hypoglycemia. They include:  Dizziness.  Shaking.  Sweating.  Chills.  Confusion.  Drink plenty of water while you exercise to prevent dehydration or heat stroke. Body water is lost during exercise and must be replaced.  Talk to your health care provider before starting an exercise program to make sure it is safe for you. Remember, almost any type of activity is better than none. Document Released: 12/19/2003 Document Revised: 02/12/2014 Document Reviewed: 03/07/2013 [employer] Patient Information 2015 [employer]. This information is not intended to replace advice given to you by your health care provider. Make sure you discuss any   questions you have with your health care provider. Basic Carbohydrate Counting for Diabetes Mellitus Carbohydrate counting is a method for keeping track of the amount of carbohydrates you eat. Eating carbohydrates naturally increases the level of sugar (glucose) in your blood, so it is important for you to know the amount that is okay for you to have in every meal. Carbohydrate counting helps keep the level of glucose in your blood within normal limits. The amount of carbohydrates allowed is  different for every person. A dietitian can help you calculate the amount that is right for you. Once you know the amount of carbohydrates you can have, you can count the carbohydrates in the foods you want to eat. Carbohydrates are found in the following foods:  Grains, such as breads and cereals.  Dried beans and soy products.  Starchy vegetables, such as potatoes, peas, and corn.  Fruit and fruit juices.  Milk and yogurt.  Sweets and snack foods, such as cake, cookies, candy, chips, soft drinks, and fruit drinks. CARBOHYDRATE COUNTING There are two ways to count the carbohydrates in your food. You can use either of the methods or a combination of both. Reading the "Nutrition Facts" on Packaged Food The "Nutrition Facts" is an area that is included on the labels of almost all packaged food and beverages in the [place]. It includes the serving size of that food or beverage and information about the nutrients in each serving of the food, including the grams (g) of carbohydrate per serving.  Decide the number of servings of this food or beverage that you will be able to eat or drink. Multiply that number of servings by the number of grams of carbohydrate that is listed on the label for that serving. The total will be the amount of carbohydrates you will be having when you eat or drink this food or beverage. Learning Standard Serving Sizes of Food When you eat food that is not packaged or does not include "Nutrition Facts" on the label, you need to measure the servings in order to count the amount of carbohydrates.A serving of most carbohydrate-rich foods contains about 15 g of carbohydrates. The following list includes serving sizes of carbohydrate-rich foods that provide 15 g ofcarbohydrate per serving:   1 slice of bread (1 oz) or 1 six-inch tortilla.    of a hamburger bun or English muffin.  4-6 crackers.   cup unsweetened dry cereal.    cup hot cereal.   cup rice or  pasta.    cup mashed potatoes or  of a large baked potato.  1 cup fresh fruit or one small piece of fruit.    cup canned or frozen fruit or fruit juice.  1 cup milk.   cup plain fat-free yogurt or yogurt sweetened with artificial sweeteners.   cup cooked dried beans or starchy vegetable, such as peas, corn, or potatoes.  Decide the number of standard-size servings that you will eat. Multiply that number of servings by 15 (the grams of carbohydrates in that serving). For example, if you eat 2 cups of strawberries, you will have eaten 2 servings and 30 g of carbohydrates (2 servings x 15 g = 30 g). For foods such as soups and casseroles, in which more than one food is mixed in, you will need to count the carbohydrates in each food that is included. EXAMPLE OF CARBOHYDRATE COUNTING Sample Dinner  3 oz chicken breast.   cup of brown rice.   cup of corn.    1 cup milk.   1 cup strawberries with sugar-free whipped topping.  Carbohydrate Calculation Step 1: Identify the foods that contain carbohydrates:   Rice.   Corn.   Milk.   Strawberries. Step 2:Calculate the number of servings eaten of each:   2 servings of rice.   1 serving of corn.   1 serving of milk.   1 serving of strawberries. Step 3: Multiply each of those number of servings by 15 g:   2 servings of rice x 15 g = 30 g.   1 serving of corn x 15 g = 15 g.   1 serving of milk x 15 g = 15 g.   1 serving of strawberries x 15 g = 15 g. Step 4: Add together all of the amounts to find the total grams of carbohydrates eaten: 30 g + 15 g + 15 g + 15 g = 75 g. Document Released: 09/28/2005 Document Revised: 02/12/2014 Document Reviewed: 08/25/2013 [facility] Patient Information 2015 [place], [place]. This information is not intended to replace advice given to you by your health care provider. Make sure you discuss any questions you have with your health care provider. Hypoglycemia Hypoglycemia  occurs when the glucose in your blood is too low. Glucose is a type of sugar that is your body's main energy source. Hormones, such as insulin and glucagon, control the level of glucose in the blood. Insulin lowers blood glucose and glucagon increases blood glucose. Having too much insulin in your blood stream, or not eating enough food containing sugar, can result in hypoglycemia. Hypoglycemia can happen to people with or without diabetes. It can develop quickly and can be a medical emergency.  CAUSES   Missing or delaying meals.  Not eating enough carbohydrates at meals.  Taking too much diabetes medicine.  Not timing your oral diabetes medicine or insulin doses with meals, snacks, and exercise.  Nausea and vomiting.  Certain medicines.  Severe illnesses, such as hepatitis, kidney disorders, and certain eating disorders.  Increased activity or exercise without eating something extra or adjusting medicines.  Drinking too much alcohol.  A nerve disorder that affects body functions like your heart rate, blood pressure, and digestion (autonomic neuropathy).  A condition where the stomach muscles do not function properly (gastroparesis). Therefore, medicines and food may not absorb properly.  Rarely, a tumor of the pancreas can produce too much insulin. SYMPTOMS   Hunger.  Sweating (diaphoresis).  Change in body temperature.  Shakiness.  Headache.  Anxiety.  Lightheadedness.  Irritability.  Difficulty concentrating.  Dry mouth.  Tingling or numbness in the hands or feet.  Restless sleep or sleep disturbances.  Altered speech and coordination.  Change in mental status.  Seizures or prolonged convulsions.  Combativeness.  Drowsiness (lethargic).  Weakness.  Increased heart rate or palpitations.  Confusion.  Pale, gray skin color.  Blurred or double vision.  Fainting. DIAGNOSIS  A physical exam and medical history will be performed. Your caregiver  may make a diagnosis based on your symptoms. Blood tests and other lab tests may be performed to confirm a diagnosis. Once the diagnosis is made, your caregiver will see if your signs and symptoms go away once your blood glucose is raised.  TREATMENT  Usually, you can easily treat your hypoglycemia when you notice symptoms.  Check your blood glucose. If it is less than 70 mg/dl, take one of the following:   3-4 glucose tablets.    cup juice.    cup regular soda.  1 cup skim milk.   -1 tube of glucose gel.   5-6 hard candies.   Avoid high-fat drinks or food that may delay a rise in blood glucose levels.  Do not take more than the recommended amount of sugary foods, drinks, gel, or tablets. Doing so will cause your blood glucose to go too high.   Wait 10-15 minutes and recheck your blood glucose. If it is still less than 70 mg/dl or below your target range, repeat treatment.   Eat a snack if it is more than 1 hour until your next meal.  There may be a time when your blood glucose may go so low that you are unable to treat yourself at home when you start to notice symptoms. You may need someone to help you. You may even faint or be unable to swallow. If you cannot treat yourself, someone will need to bring you to the hospital.  [place]  If you have diabetes, follow your diabetes management plan by:  Taking your medicines as directed.  Following your exercise plan.  Following your meal plan. Do not skip meals. Eat on time.  Testing your blood glucose regularly. Check your blood glucose before and after exercise. If you exercise longer or different than usual, be sure to check blood glucose more frequently.  Wearing your medical alert jewelry that says you have diabetes.  Identify the cause of your hypoglycemia. Then, develop ways to prevent the recurrence of hypoglycemia.  Do not take a hot bath or shower right after an insulin shot.  Always carry  treatment with you. Glucose tablets are the easiest to carry.  If you are going to drink alcohol, drink it only with meals.  Tell friends or family members ways to keep you safe during a seizure. This may include removing hard or sharp objects from the area or turning you on your side.  Maintain a healthy weight. SEEK MEDICAL CARE IF:   You are having problems keeping your blood glucose in your target range.  You are having frequent episodes of hypoglycemia.  You feel you might be having side effects from your medicines.  You are not sure why your blood glucose is dropping so low.  You notice a change in vision or a new problem with your vision. SEEK IMMEDIATE MEDICAL CARE IF:   Confusion develops.  A change in mental status occurs.  The inability to swallow develops.  Fainting occurs. Document Released: 09/28/2005 Document Revised: 10/03/2013 Document Reviewed: 01/25/2012 [facility] Patient Information 2015 [place], [place]. This information is not intended to replace advice given to you by your health care provider. Make sure you discuss any questions you have with your health care provider.

## 2015-01-28 NOTE — Progress Notes (Signed)
Patient presents for CBG and record review for T2DM, however, patient did not bring log or meter Med list reviewed; patient reports taking all meds as directed Taking Januvia 100 mg daily and glipizide 10 mg bid with meals Was out of meds for 7 days and has been back on for 3 weeks Patient reports AM fasting blood sugars ranging 189-340 Patient reports before bed blood sugars ranging 190-289 Patient will begin checking BS AM fasting and prior to evening meal Patient given blood sugar log and instructed on use. Instructed to bring to all future visits. Walking at work; Encouraged to walk 30 minutes daily for exercise States tripped over rock yesterday after church and fell C/o left wrist, left elbow and left knee pain. Rates all pain 5/10 States she's stressed due to pain form fall and BS go up when stressed  CBG 414 1 hour after eating 20 units novolog insulin given SQ per protocol  BP 135/77 P 80 R 18 T 97.8 oral SpO2 98%  U/A Glucose 500 Ketones negative Protein 30  Hgb A1c >15  Per Covering Provider: Stop Januvia Start lantus 10 units daily at 10 pm If BS < 70 hold glipizide and call office  S/sx of hypoglycemia and immediate actions to take discussed in detail  Patient advised to call for med refills at least 7 days before running out so as not to go without.  Patient aware that she is to f/u with PCP 3 months from last visit. Due 02/13/2015  Patient given literature on Diabetes and Food, Diabetes and Exercise, Basic Carb Counting, Hypoglycemia, and The Plate Method  Encouraged patient to meet with In-house Health Coach for diabetic meal planning  CBG 357 40 minutes after insulin admin. Patient discharged to home in stable condition.

## 2015-02-07 ENCOUNTER — Other Ambulatory Visit: Payer: Self-pay | Admitting: Internal Medicine

## 2015-02-08 ENCOUNTER — Other Ambulatory Visit: Payer: Self-pay | Admitting: Internal Medicine

## 2015-02-08 MED ORDER — INSULIN GLARGINE 100 UNIT/ML SOLOSTAR PEN
10.0000 [IU] | PEN_INJECTOR | Freq: Every day | SUBCUTANEOUS | Status: DC
Start: 2015-02-08 — End: 2015-02-12

## 2015-02-12 ENCOUNTER — Encounter: Payer: Self-pay | Admitting: Internal Medicine

## 2015-02-12 ENCOUNTER — Ambulatory Visit: Payer: Self-pay | Attending: Internal Medicine | Admitting: Internal Medicine

## 2015-02-12 VITALS — BP 168/84 | HR 94 | Temp 98.0°F | Resp 16 | Wt 164.8 lb

## 2015-02-12 DIAGNOSIS — R739 Hyperglycemia, unspecified: Secondary | ICD-10-CM

## 2015-02-12 DIAGNOSIS — Z794 Long term (current) use of insulin: Secondary | ICD-10-CM | POA: Insufficient documentation

## 2015-02-12 DIAGNOSIS — E1121 Type 2 diabetes mellitus with diabetic nephropathy: Secondary | ICD-10-CM | POA: Insufficient documentation

## 2015-02-12 DIAGNOSIS — E118 Type 2 diabetes mellitus with unspecified complications: Secondary | ICD-10-CM

## 2015-02-12 DIAGNOSIS — I10 Essential (primary) hypertension: Secondary | ICD-10-CM | POA: Insufficient documentation

## 2015-02-12 DIAGNOSIS — E1165 Type 2 diabetes mellitus with hyperglycemia: Secondary | ICD-10-CM | POA: Insufficient documentation

## 2015-02-12 DIAGNOSIS — Z7982 Long term (current) use of aspirin: Secondary | ICD-10-CM | POA: Insufficient documentation

## 2015-02-12 LAB — BASIC METABOLIC PANEL WITH GFR
BUN: 33 mg/dL — ABNORMAL HIGH (ref 6–23)
CALCIUM: 8.7 mg/dL (ref 8.4–10.5)
CO2: 23 meq/L (ref 19–32)
Chloride: 98 mEq/L (ref 96–112)
Creat: 1.79 mg/dL — ABNORMAL HIGH (ref 0.50–1.10)
GFR, Est African American: 36 mL/min — ABNORMAL LOW
GFR, Est Non African American: 31 mL/min — ABNORMAL LOW
GLUCOSE: 541 mg/dL — AB (ref 70–99)
Potassium: 4 mEq/L (ref 3.5–5.3)
SODIUM: 131 meq/L — AB (ref 135–145)

## 2015-02-12 LAB — POCT URINALYSIS DIPSTICK
BILIRUBIN UA: NEGATIVE
Glucose, UA: 500
KETONES UA: NEGATIVE
Leukocytes, UA: NEGATIVE
Nitrite, UA: NEGATIVE
RBC UA: NEGATIVE
Spec Grav, UA: 1.015
Urobilinogen, UA: 0.2
pH, UA: 5.5

## 2015-02-12 LAB — GLUCOSE, POCT (MANUAL RESULT ENTRY)
POC Glucose: 584 mg/dl — AB (ref 70–99)
POC Glucose: 432 mg/dl — AB (ref 70–99)

## 2015-02-12 MED ORDER — GLIPIZIDE 10 MG PO TABS: 10.0000 mg | ORAL_TABLET | Freq: Two times a day (BID) | ORAL | Status: DC

## 2015-02-12 MED ORDER — INSULIN ASPART 100 UNIT/ML ~~LOC~~ SOLN
20.0000 [IU] | Freq: Once | SUBCUTANEOUS | Status: AC
  Administered 2015-02-12: 20 [IU] via SUBCUTANEOUS

## 2015-02-12 MED ORDER — INSULIN GLARGINE 100 UNIT/ML SOLOSTAR PEN: 20.0000 [IU] | PEN_INJECTOR | Freq: Every day | SUBCUTANEOUS | Status: DC

## 2015-02-12 NOTE — Progress Notes (Signed)
Patient here for follow up on her diabetes ' Patient states that since her medications were changed her blood sugars Have been running high Presents in office with elevated blood sugar 20units novolg given per protocol

## 2015-02-12 NOTE — Progress Notes (Signed)
Patient ID: Patricia [name], female   DOB: 29-Oct-1955, 59 y.o.   MRN: [id number]   Patricia [name], is a 59 y.o. female  [id number]:[id number]  HCW:[id number]  DOB - 29-Jan-1956  Chief Complaint  Patient presents with  . Follow-up        Subjective:   Patricia [name] is a 59 y.o. female here today for a follow up visit. Patient has hypertension, diabetes mellitus with nephropathy, major depression and obesity, she came today for follow-up after recent changes in her medications. Patient was noticed to have increased creatinine level recently, she was taken off of her Janumet and place on Lantus insulin 10 units daily at bedtime. Patient has recorded blood sugar ranging from 250-400 since these changes. Patient claims she constantly urinate and she feels unwell. She also take Glucotrol 10 mg by mouth twice a day. Patient has no new complaints today. Patient claims compliant with her medications and insulin. Patient has No headache, No chest pain, No abdominal pain - No Nausea, No new weakness tingling or numbness, No Cough - SOB.  Problem  Essential Hypertension  Hyperglycemia    ALLERGIES: Allergies  Allergen Reactions  . Sulfa Antibiotics Itching and Rash    PAST MEDICAL HISTORY: Past Medical History  Diagnosis Date  . Diabetes mellitus without complication   . Hypertension   . Obesity     MEDICATIONS AT HOME: Prior to Admission medications   Medication Sig Start Date End Date Taking? Authorizing Provider  aspirin EC 81 MG tablet Take 81 mg by mouth daily.    Historical Provider, MD  atorvastatin (LIPITOR) 10 MG tablet TAKE 1 TABLET BY MOUTH DAILY 02/08/15   Tresa Garter, MD  escitalopram (LEXAPRO) 10 MG tablet Take 1 tablet (10 mg total) by mouth daily. Patient taking differently: Take 10 mg by mouth at bedtime as needed (anxiety/ insomnia).  10/19/14   Tresa Garter, MD  fenofibrate (TRICOR) 145 MG tablet Take 1 tablet (145 mg total) by mouth daily. 12/03/14   Tresa Garter,  MD  glipiZIDE (GLUCOTROL) 10 MG tablet Take 1 tablet (10 mg total) by mouth 2 (two) times daily before a meal. 02/12/15   Tresa Garter, MD  glucose monitoring kit (FREESTYLE) monitoring kit 1 each by Does not apply route as needed for other. 10/23/13   Tresa Garter, MD  hydrochlorothiazide (HYDRODIURIL) 25 MG tablet Take 1 tablet (25 mg total) by mouth daily. 10/03/14   Tresa Garter, MD  HYDROcodone-acetaminophen (NORCO) 5-325 MG per tablet Take 2 tablets by mouth every 4 (four) hours as needed. 11/04/14   Ernestina Patches, MD  Insulin Glargine (LANTUS SOLOSTAR) 100 UNIT/ML Solostar Pen Inject 20 Units into the skin daily at 10 pm. 02/12/15   Tresa Garter, MD  Insulin Pen Needle 29G X 12.7MM MISC 10 Units by Does not apply route 1 day or 1 dose. 01/28/15   Lance Bosch, NP  Vitamin D, Ergocalciferol, (DRISDOL) 50000 UNITS CAPS capsule Take 1 capsule (50,000 Units total) by mouth every 7 (seven) days. 12/03/14   Tresa Garter, MD     Objective:   Filed Vitals:   02/12/15 1025  BP: 168/84  Pulse: 94  Temp: 98 F (36.7 C)  Resp: 16  Weight: 164 lb 12.8 oz (74.753 kg)  SpO2: 100%    Exam General appearance : Awake, alert, not in any distress. Speech Clear. Not toxic looking HEENT: Atraumatic and Normocephalic, pupils equally reactive to light and accomodation Neck: supple,  no JVD. No cervical lymphadenopathy.  Chest:Good air entry bilaterally, no added sounds  CVS: S1 S2 regular, no murmurs.  Abdomen: Bowel sounds present, Non tender and not distended with no gaurding, rigidity or rebound. Extremities: B/L Lower Ext shows no edema, both legs are warm to touch Neurology: Awake alert, and oriented X 3, CN II-XII intact, Non focal Skin:No Rash  Data Review Lab Results  Component Value Date   HGBA1C >15.0 01/28/2015   HGBA1C 8.7 10/01/2014   HGBA1C 8.0 06/28/2014     Assessment & Plan   1. 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 - Urinalysis Dipstick  - BASIC METABOLIC PANEL WITH GFR  Increase - Insulin Glargine (LANTUS SOLOSTAR) 100 UNIT/ML Solostar Pen; Inject 20 Units into the skin daily at 10 pm.  Dispense: 15 mL; Refill: 3 - glipiZIDE (GLUCOTROL) 10 MG tablet; Take 1 tablet (10 mg total) by mouth 2 (two) times daily before a meal.  Dispense: 180 tablet; Refill: 3  - Insert peripheral IV; Standing - Insert peripheral IV - Glucose (CBG) = 584  2. Diabetic nephropathy associated with type 2 diabetes mellitus  Aim for 2-3 Carb Choices per meal (30-45 grams) +/- 1 either way  Aim for 0-15 Carbs per snack if hungry  Include protein in moderation with your meals and snacks  Consider reading food labels for Total Carbohydrate and Fat Grams of foods  Consider checking BG at alternate times per day  Continue taking medication as directed Fruit Punch - find one with no sugar  Measure and decrease portions of carbohydrate foods  Make your plate and don't go back for seconds   3. Essential hypertension  - We have discussed target BP range and blood pressure goal - I have advised patient to check BP regularly and to call us back or report to clinic if the numbers are consistently higher than 140/90  - We discussed the importance of compliance with medical therapy and DASH diet recommended, consequences of uncontrolled hypertension discussed.  - continue current BP medications  4. Hyperglycemia with CBG of 584 IVF Normal Saline 1 L given in the clinic today Repeat CBG  = 432  Patient have been counseled extensively about nutrition and exercise Return in about 2 weeks (around 02/26/2015), or if symptoms  worsen or fail to improve, for BP Check, Nurse Visit, CBG, Lab/Nurse Visit.  The patient was given clear instructions to go to ER or return to medical center if symptoms don't improve, worsen or new problems develop. The patient verbalized understanding. The patient was told to call to get lab results if they haven't heard anything in the next week.   This note has been created with Surveyor, quantity. Any transcriptional errors are unintentional.    Angelica Chessman, MD, [place], [place], [place], [place] and [place], [place]   02/12/2015, 12:01 PM

## 2015-02-12 NOTE — Patient Instructions (Signed)
Diabetes and Exercise Exercising regularly is important. It is not just about losing weight. It has many health benefits, such as:  Improving your overall fitness, flexibility, and endurance.  Increasing your bone density.  Helping with weight control.  Decreasing your body fat.  Increasing your muscle strength.  Reducing stress and tension.  Improving your overall health. People with diabetes who exercise gain additional benefits because exercise:  Reduces appetite.  Improves the body's use of blood sugar (glucose).  Helps lower or control blood glucose.  Decreases blood pressure.  Helps control blood lipids (such as cholesterol and triglycerides).  Improves the body's use of the hormone insulin by:  Increasing the body's insulin sensitivity.  Reducing the body's insulin needs.  Decreases the risk for heart disease because exercising:  Lowers cholesterol and triglycerides levels.  Increases the levels of good cholesterol (such as high-density lipoproteins [HDL]) in the body.  Lowers blood glucose levels. YOUR ACTIVITY PLAN  Choose an activity that you enjoy and set realistic goals. Your health care provider or diabetes educator can help you make an activity plan that works for you. Exercise regularly as directed by your health care provider. This includes:  Performing resistance training twice a week such as push-ups, sit-ups, lifting weights, or using resistance bands.  Performing 150 minutes of cardio exercises each week such as walking, running, or playing sports.  Staying active and spending no more than 90 minutes at one time being inactive. Even short bursts of exercise are good for you. Three 10-minute sessions spread throughout the day are just as beneficial as a single 30-minute session. Some exercise ideas include:  Taking the dog for a walk.  Taking the stairs instead of the elevator.  Dancing to your favorite song.  Doing an exercise  video.  Doing your favorite exercise with a friend. RECOMMENDATIONS FOR EXERCISING WITH TYPE 1 OR TYPE 2 DIABETES   Check your blood glucose before exercising. If blood glucose levels are greater than 240 mg/dL, check for urine ketones. Do not exercise if ketones are present.  Avoid injecting insulin into areas of the body that are going to be exercised. For example, avoid injecting insulin into:  The arms when playing tennis.  The legs when jogging.  Keep a record of:  Food intake before and after you exercise.  Expected peak times of insulin action.  Blood glucose levels before and after you exercise.  The type and amount of exercise you have done.  Review your records with your health care provider. Your health care provider will help you to develop guidelines for adjusting food intake and insulin amounts before and after exercising.  If you take insulin or oral hypoglycemic agents, watch for signs and symptoms of hypoglycemia. They include:  Dizziness.  Shaking.  Sweating.  Chills.  Confusion.  Drink plenty of water while you exercise to prevent dehydration or heat stroke. Body water is lost during exercise and must be replaced.  Talk to your health care provider before starting an exercise program to make sure it is safe for you. Remember, almost any type of activity is better than none. Document Released: 12/19/2003 Document Revised: 02/12/2014 Document Reviewed: 03/07/2013 Exit[employer] Patient Information 2015 [employer]. This information is not intended to replace advice given to you by your health care provider. Make sure you discuss any questions you have with your health care provider. Basic Carbohydrate Counting for Diabetes Mellitus Carbohydrate counting is a method for keeping track of the amount of carbohydrates you eat.   Eating carbohydrates naturally increases the level of sugar (glucose) in your blood, so it is important for you to know the amount that is  okay for you to have in every meal. Carbohydrate counting helps keep the level of glucose in your blood within normal limits. The amount of carbohydrates allowed is different for every person. A dietitian can help you calculate the amount that is right for you. Once you know the amount of carbohydrates you can have, you can count the carbohydrates in the foods you want to eat. Carbohydrates are found in the following foods:  Grains, such as breads and cereals.  Dried beans and soy products.  Starchy vegetables, such as potatoes, peas, and corn.  Fruit and fruit juices.  Milk and yogurt.  Sweets and snack foods, such as cake, cookies, candy, chips, soft drinks, and fruit drinks. CARBOHYDRATE COUNTING There are two ways to count the carbohydrates in your food. You can use either of the methods or a combination of both. Reading the "Nutrition Facts" on Packaged Food The "Nutrition Facts" is an area that is included on the labels of almost all packaged food and beverages in the [place]. It includes the serving size of that food or beverage and information about the nutrients in each serving of the food, including the grams (g) of carbohydrate per serving.  Decide the number of servings of this food or beverage that you will be able to eat or drink. Multiply that number of servings by the number of grams of carbohydrate that is listed on the label for that serving. The total will be the amount of carbohydrates you will be having when you eat or drink this food or beverage. Learning Standard Serving Sizes of Food When you eat food that is not packaged or does not include "Nutrition Facts" on the label, you need to measure the servings in order to count the amount of carbohydrates.A serving of most carbohydrate-rich foods contains about 15 g of carbohydrates. The following list includes serving sizes of carbohydrate-rich foods that provide 15 g ofcarbohydrate per serving:   1 slice of bread  (1 oz) or 1 six-inch tortilla.    of a hamburger bun or English muffin.  4-6 crackers.   cup unsweetened dry cereal.    cup hot cereal.   cup rice or pasta.    cup mashed potatoes or  of a large baked potato.  1 cup fresh fruit or one small piece of fruit.    cup canned or frozen fruit or fruit juice.  1 cup milk.   cup plain fat-free yogurt or yogurt sweetened with artificial sweeteners.   cup cooked dried beans or starchy vegetable, such as peas, corn, or potatoes.  Decide the number of standard-size servings that you will eat. Multiply that number of servings by 15 (the grams of carbohydrates in that serving). For example, if you eat 2 cups of strawberries, you will have eaten 2 servings and 30 g of carbohydrates (2 servings x 15 g = 30 g). For foods such as soups and casseroles, in which more than one food is mixed in, you will need to count the carbohydrates in each food that is included. EXAMPLE OF CARBOHYDRATE COUNTING Sample Dinner  3 oz chicken breast.   cup of brown rice.   cup of corn.  1 cup milk.   1 cup strawberries with sugar-free whipped topping.  Carbohydrate Calculation Step 1: Identify the foods that contain carbohydrates:   Rice.   Corn.     Milk.   Strawberries. Step 2:Calculate the number of servings eaten of each:   2 servings of rice.   1 serving of corn.   1 serving of milk.   1 serving of strawberries. Step 3: Multiply each of those number of servings by 15 g:   2 servings of rice x 15 g = 30 g.   1 serving of corn x 15 g = 15 g.   1 serving of milk x 15 g = 15 g.   1 serving of strawberries x 15 g = 15 g. Step 4: Add together all of the amounts to find the total grams of carbohydrates eaten: 30 g + 15 g + 15 g + 15 g = 75 g. Document Released: 09/28/2005 Document Revised: 02/12/2014 Document Reviewed: 08/25/2013 Exit[employer] Patient Information 2015 [employer]. This information is not intended to  replace advice given to you by your health care provider. Make sure you discuss any questions you have with your health care provider.  

## 2015-02-13 ENCOUNTER — Encounter: Payer: Self-pay | Admitting: *Deleted

## 2015-02-13 NOTE — Progress Notes (Unsigned)
Patient ID: Patricia [name], female   DOB: 08/25/1956, 58 y.o.   MRN: [id number]   [facility] lab called with critical glucose of 541.  This result was repeated and verified.  Results routed to PCP.

## 2015-02-14 ENCOUNTER — Telehealth: Payer: Self-pay

## 2015-02-14 NOTE — Telephone Encounter (Signed)
Patient is aware of her lab results Patient has return appointment 5/17

## 2015-02-14 NOTE — Telephone Encounter (Signed)
-----   Message from Tresa Garter, MD sent at 02/13/2015  6:11 PM EDT ----- Please inform patient that her kidney function shows no significant improvement but stable, encouraged patient to continue good blood sugar control to avoid further kidney damage, continue the new insulin regimen and glipizide. Report to clinic in 2 weeks for blood sugar check and medication review

## 2015-03-04 ENCOUNTER — Other Ambulatory Visit: Payer: Self-pay | Admitting: Internal Medicine

## 2015-03-18 ENCOUNTER — Ambulatory Visit: Payer: Self-pay

## 2015-04-09 ENCOUNTER — Other Ambulatory Visit: Payer: Self-pay | Admitting: Internal Medicine

## 2015-04-10 ENCOUNTER — Ambulatory Visit: Payer: Self-pay | Attending: Internal Medicine | Admitting: *Deleted

## 2015-04-10 VITALS — BP 114/72 | HR 91 | Temp 98.0°F | Resp 16 | Ht 64.5 in | Wt 164.0 lb

## 2015-04-10 DIAGNOSIS — E118 Type 2 diabetes mellitus with unspecified complications: Secondary | ICD-10-CM

## 2015-04-10 DIAGNOSIS — E1165 Type 2 diabetes mellitus with hyperglycemia: Secondary | ICD-10-CM | POA: Insufficient documentation

## 2015-04-10 DIAGNOSIS — Z794 Long term (current) use of insulin: Secondary | ICD-10-CM | POA: Insufficient documentation

## 2015-04-10 DIAGNOSIS — I10 Essential (primary) hypertension: Secondary | ICD-10-CM | POA: Insufficient documentation

## 2015-04-10 LAB — POCT URINALYSIS DIPSTICK
BILIRUBIN UA: NEGATIVE
Blood, UA: NEGATIVE
GLUCOSE UA: 500
Ketones, UA: NEGATIVE
NITRITE UA: NEGATIVE
Protein, UA: 30
Spec Grav, UA: 1.02
Urobilinogen, UA: 0.2
pH, UA: 5.5

## 2015-04-10 LAB — GLUCOSE, POCT (MANUAL RESULT ENTRY)
POC Glucose: 294 mg/dl — AB (ref 70–99)
POC Glucose: 328 mg/dl — AB (ref 70–99)

## 2015-04-10 MED ORDER — INSULIN ASPART 100 UNIT/ML ~~LOC~~ SOLN
10.0000 [IU] | Freq: Once | SUBCUTANEOUS | Status: AC
  Administered 2015-04-10: 10 [IU] via SUBCUTANEOUS

## 2015-04-10 MED ORDER — INSULIN ASPART 100 UNIT/ML ~~LOC~~ SOLN: SUBCUTANEOUS | Status: DC

## 2015-04-10 MED ORDER — INSULIN GLARGINE 100 UNIT/ML SOLOSTAR PEN: 25.0000 [IU] | PEN_INJECTOR | Freq: Every day | SUBCUTANEOUS | Status: DC

## 2015-04-10 NOTE — Patient Instructions (Signed)
Diabetes and Foot Care Diabetes may cause you to have problems because of poor blood supply (circulation) to your feet and legs. This may cause the skin on your feet to become thinner, break easier, and heal more slowly. Your skin may become dry, and the skin may peel and crack. You may also have nerve damage in your legs and feet causing decreased feeling in them. You may not notice minor injuries to your feet that could lead to infections or more serious problems. Taking care of your feet is one of the most important things you can do for yourself.  HOME CARE INSTRUCTIONS  Wear shoes at all times, even in the house. Do not go barefoot. Bare feet are easily injured.  Check your feet daily for blisters, cuts, and redness. If you cannot see the bottom of your feet, use a mirror or ask someone for help.  Wash your feet with warm water (do not use hot water) and mild soap. Then pat your feet and the areas between your toes until they are completely dry. Do not soak your feet as this can dry your skin.  Apply a moisturizing lotion or petroleum jelly (that does not contain alcohol and is unscented) to the skin on your feet and to dry, brittle toenails. Do not apply lotion between your toes.  Trim your toenails straight across. Do not dig under them or around the cuticle. File the edges of your nails with an emery board or nail file.  Do not cut corns or calluses or try to remove them with medicine.  Wear clean socks or stockings every day. Make sure they are not too tight. Do not wear knee-high stockings since they may decrease blood flow to your legs.  Wear shoes that fit properly and have enough cushioning. To break in new shoes, wear them for just a few hours a day. This prevents you from injuring your feet. Always look in your shoes before you put them on to be sure there are no objects inside.  Do not cross your legs. This may decrease the blood flow to your feet.  If you find a minor scrape,  cut, or break in the skin on your feet, keep it and the skin around it clean and dry. These areas may be cleansed with mild soap and water. Do not cleanse the area with peroxide, alcohol, or iodine.  When you remove an adhesive bandage, be sure not to damage the skin around it.  If you have a wound, look at it several times a day to make sure it is healing.  Do not use heating pads or hot water bottles. They may burn your skin. If you have lost feeling in your feet or legs, you may not know it is happening until it is too late.  Make sure your health care provider performs a complete foot exam at least annually or more often if you have foot problems. Report any cuts, sores, or bruises to your health care provider immediately. SEEK MEDICAL CARE IF:   You have an injury that is not healing.  You have cuts or breaks in the skin.  You have an ingrown nail.  You notice redness on your legs or feet.  You feel burning or tingling in your legs or feet.  You have pain or cramps in your legs and feet.  Your legs or feet are numb.  Your feet always feel cold. SEEK IMMEDIATE MEDICAL CARE IF:   There is increasing redness,   swelling, or pain in or around a wound.  There is a red line that goes up your leg.  Pus is coming from a wound.  You develop a fever or as directed by your health care provider.  You notice a bad smell coming from an ulcer or wound. Document Released: 09/25/2000 Document Revised: 05/31/2013 Document Reviewed: 03/07/2013 [facility] Patient Information 2015 [place], [place]. This information is not intended to replace advice given to you by your health care provider. Make sure you discuss any questions you have with your health care provider. Diabetic Neuropathy Diabetic neuropathy is a nerve disease or nerve damage that is caused by diabetes mellitus. About half of all people with diabetes mellitus have some form of nerve damage. Nerve damage is more common in those who  have had diabetes mellitus for many years and who generally have not had good control of their blood sugar (glucose) level. Diabetic neuropathy is a common complication of diabetes mellitus. There are three more common types of diabetic neuropathy and a fourth type that is less common and less understood:   Peripheral neuropathy--This is the most common type of diabetic neuropathy. It causes damage to the nerves of the feet and legs first and then eventually the hands and arms.The damage affects the ability to sense touch.  Autonomic neuropathy--This type causes damage to the autonomic nervous system, which controls the following functions:  Heartbeat.  Body temperature.  Blood pressure.  Urination.  Digestion.  Sweating.  Sexual function.  Focal neuropathy--Focal neuropathy can be painful and unpredictable and occurs most often in older adults with diabetes mellitus. It involves a specific nerve or one area and often comes on suddenly. It usually does not cause long-term problems.  Radiculoplexus neuropathy-- Sometimes called lumbosacral radiculoplexus neuropathy, radiculoplexus neuropathy affects the nerves of the thighs, hips, buttocks, or legs. It is more common in people with type 2 diabetes mellitus and in older men. It is characterized by debilitating pain, weakness, and atrophy, usually in the thigh muscles. CAUSES  The cause of peripheral, autonomic, and focal neuropathies is diabetes mellitus that is uncontrolled and high glucose levels. The cause of radiculoplexus neuropathy is unknown. However, it is thought to be caused by inflammation related to uncontrolled glucose levels. SIGNS AND SYMPTOMS  Peripheral Neuropathy Peripheral neuropathy develops slowly over time. When the nerves of the feet and legs no longer work there may be:   Burning, stabbing, or aching pain in the legs or feet.  Inability to feel pressure or pain in your feet. This can lead to:  Thick calluses  over pressure areas.  Pressure sores.  Ulcers.  Foot deformities.  Reduced ability to feel temperature changes.  Muscle weakness. Autonomic Neuropathy The symptoms of autonomic neuropathy vary depending on which nerves are affected. Symptoms may include:  Problems with digestion, such as:  Feeling sick to your stomach (nausea).  Vomiting.  Bloating.  Constipation.  Diarrhea.  Abdominal pain.  Difficulty with urination. This occurs if you lose your ability to sense when your bladder is full. Problems include:  Urine leakage (incontinence).  Inability to empty your bladder completely (retention).  Rapid or irregular heartbeat (palpitations).  Blood pressure drops when you stand up (orthostatic hypotension). When you stand up you may feel:  Dizzy.  Weak.  Faint.  In men, inability to attain and maintain an erection.  In women, vaginal dryness and problems with decreased sexual desire and arousal.  Problems with body temperature regulation.  Increased or decreased sweating. Focal Neuropathy  Abnormal eye movements or abnormal alignment of both eyes.  Weakness in the wrist.  Foot drop. This results in an inability to lift the foot properly and abnormal walking or foot movement.  Paralysis on one side of your face (Bell palsy).  Chest or abdominal pain. Radiculoplexus Neuropathy  Sudden, severe pain in your hip, thigh, or buttocks.  Weakness and wasting of thigh muscles.  Difficulty rising from a seated position.  Abdominal swelling.  Unexplained weight loss (usually more than 10 lb [4.5 kg]). DIAGNOSIS  Peripheral Neuropathy Your senses may be tested. Sensory function testing can be done with:  A light touch using a monofilament.  A vibration with tuning fork.  A sharp sensation with a pin prick. Other tests that can help diagnose neuropathy are:  Nerve conduction velocity. This test checks the transmission of an electrical current  through a nerve.  Electromyography. This shows how muscles respond to electrical signals transmitted by nearby nerves.  Quantitative sensory testing. This is used to assess how your nerves respond to vibrations and changes in temperature. Autonomic Neuropathy Diagnosis is often based on reported symptoms. Tell your health care provider if you experience:   Dizziness.   Constipation.   Diarrhea.   Inappropriate urination or inability to urinate.   Inability to get or maintain an erection.  Tests that may be done include:   Electrocardiography or Holter monitor. These are tests that can help show problems with the heart rate or heart rhythm.   An X-ray exam may be done. Focal Neuropathy Diagnosis is made based on your symptoms and what your health care provider finds during your exam. Other tests may be done. They may include:  Nerve conduction velocities. This checks the transmission of electrical current through a nerve.  Electromyography. This shows how muscles respond to electrical signals transmitted by nearby nerves.  Quantitative sensory testing. This test is used to assess how your nerves respond to vibration and changes in temperature. Radiculoplexus Neuropathy  Often the first thing is to eliminate any other issue or problems that might be the cause, as there is no stick test for diagnosis.  X-ray exam of your spine and lumbar region.  Spinal tap to rule out cancer.  MRI to rule out other lesions. TREATMENT  Once nerve damage occurs, it cannot be reversed. The goal of treatment is to keep the disease or nerve damage from getting worse and affecting more nerve fibers. Controlling your blood glucose level is the key. Most people with radiculoplexus neuropathy see at least a partial improvement over time. You will need to keep your blood glucose and HbA1c levels in the target range determined by your health care provider. Things that help control blood glucose  levels include:   Blood glucose monitoring.   Meal planning.   Physical activity.   Diabetes medicine.  Over time, maintaining lower blood glucose levels helps lessen symptoms. Sometimes, prescription pain medicine is needed. HOME CARE INSTRUCTIONS:  Do not smoke.  Keep your blood glucose level in the range that you and your health care provider have determined acceptable for you.  Keep your blood pressure level in the range that you and your health care provider have determined acceptable for you.  Eat a well-balanced diet.  Be active every day.  Check your feet every day. SEEK MEDICAL CARE IF:   You have burning, stabbing, or aching pain in the legs or feet.  You are unable to feel pressure or pain in your feet.  You  develop problems with digestion such as:  Nausea.  Vomiting.  Bloating.  Constipation.  Diarrhea.  Abdominal pain.  You have difficulty with urination, such as:  Incontinence.  Retention.  You have palpitations.  You develop orthostatic hypotension. When you stand up you may feel:  Dizzy.  Weak.  Faint.  You cannot attain and maintain an erection (in men).  You have vaginal dryness and problems with decreased sexual desire and arousal (in women).  You have severe pain in your thighs, legs, or buttocks.  You have unexplained weight loss. Document Released: 12/07/2001 Document Revised: 07/19/2013 Document Reviewed: 03/09/2013 [facility] Patient Information 2015 [place], [place]. This information is not intended to replace advice given to you by your health care provider. Make sure you discuss any questions you have with your health care provider. Diabetes Mellitus and Food It is important for you to manage your blood sugar (glucose) level. Your blood glucose level can be greatly affected by what you eat. Eating healthier foods in the appropriate amounts throughout the day at about the same time each day will help you control your  blood glucose level. It can also help slow or prevent worsening of your diabetes mellitus. Healthy eating may even help you improve the level of your blood pressure and reach or maintain a healthy weight.  HOW CAN FOOD AFFECT ME? Carbohydrates Carbohydrates affect your blood glucose level more than any other type of food. Your dietitian will help you determine how many carbohydrates to eat at each meal and teach you how to count carbohydrates. Counting carbohydrates is important to keep your blood glucose at a healthy level, especially if you are using insulin or taking certain medicines for diabetes mellitus. Alcohol Alcohol can cause sudden decreases in blood glucose (hypoglycemia), especially if you use insulin or take certain medicines for diabetes mellitus. Hypoglycemia can be a life-threatening condition. Symptoms of hypoglycemia (sleepiness, dizziness, and disorientation) are similar to symptoms of having too much alcohol.  If your health care provider has given you approval to drink alcohol, do so in moderation and use the following guidelines:  Women should not have more than one drink per day, and men should not have more than two drinks per day. One drink is equal to:  12 oz of beer.  5 oz of wine.  1 oz of hard liquor.  Do not drink on an empty stomach.  Keep yourself hydrated. Have water, diet soda, or unsweetened iced tea.  Regular soda, juice, and other mixers might contain a lot of carbohydrates and should be counted. WHAT FOODS ARE NOT RECOMMENDED? As you make food choices, it is important to remember that all foods are not the same. Some foods have fewer nutrients per serving than other foods, even though they might have the same number of calories or carbohydrates. It is difficult to get your body what it needs when you eat foods with fewer nutrients. Examples of foods that you should avoid that are high in calories and carbohydrates but low in nutrients include:  Trans  fats (most processed foods list trans fats on the Nutrition Facts label).  Regular soda.  Juice.  Candy.  Sweets, such as cake, pie, doughnuts, and cookies.  Fried foods. WHAT FOODS CAN I EAT? Have nutrient-rich foods, which will nourish your body and keep you healthy. The food you should eat also will depend on several factors, including:  The calories you need.  The medicines you take.  Your weight.  Your blood glucose level.  Your blood pressure level.  Your cholesterol level. You also should eat a variety of foods, including:  Protein, such as meat, poultry, fish, tofu, nuts, and seeds (lean animal proteins are best).  Fruits.  Vegetables.  Dairy products, such as milk, cheese, and yogurt (low fat is best).  Breads, grains, pasta, cereal, rice, and beans.  Fats such as olive oil, trans fat-free margarine, canola oil, avocado, and olives. DOES EVERYONE WITH DIABETES MELLITUS HAVE THE SAME MEAL PLAN? Because every person with diabetes mellitus is different, there is not one meal plan that works for everyone. It is very important that you meet with a dietitian who will help you create a meal plan that is just right for you. Document Released: 06/25/2005 Document Revised: 10/03/2013 Document Reviewed: 08/25/2013 [facility] Patient Information 2015 [place], [place]. This information is not intended to replace advice given to you by your health care provider. Make sure you discuss any questions you have with your health care provider. Diabetes and Exercise Exercising regularly is important. It is not just about losing weight. It has many health benefits, such as:  Improving your overall fitness, flexibility, and endurance.  Increasing your bone density.  Helping with weight control.  Decreasing your body fat.  Increasing your muscle strength.  Reducing stress and tension.  Improving your overall health. People with diabetes who exercise gain additional benefits  because exercise:  Reduces appetite.  Improves the body's use of blood sugar (glucose).  Helps lower or control blood glucose.  Decreases blood pressure.  Helps control blood lipids (such as cholesterol and triglycerides).  Improves the body's use of the hormone insulin by:  Increasing the body's insulin sensitivity.  Reducing the body's insulin needs.  Decreases the risk for heart disease because exercising:  Lowers cholesterol and triglycerides levels.  Increases the levels of good cholesterol (such as high-density lipoproteins [HDL]) in the body.  Lowers blood glucose levels. YOUR ACTIVITY PLAN  Choose an activity that you enjoy and set realistic goals. Your health care provider or diabetes educator can help you make an activity plan that works for you. Exercise regularly as directed by your health care provider. This includes:  Performing resistance training twice a week such as push-ups, sit-ups, lifting weights, or using resistance bands.  Performing 150 minutes of cardio exercises each week such as walking, running, or playing sports.  Staying active and spending no more than 90 minutes at one time being inactive. Even short bursts of exercise are good for you. Three 10-minute sessions spread throughout the day are just as beneficial as a single 30-minute session. Some exercise ideas include:  Taking the dog for a walk.  Taking the stairs instead of the elevator.  Dancing to your favorite song.  Doing an exercise video.  Doing your favorite exercise with a friend. RECOMMENDATIONS FOR EXERCISING WITH TYPE 1 OR TYPE 2 DIABETES   Check your blood glucose before exercising. If blood glucose levels are greater than 240 mg/dL, check for urine ketones. Do not exercise if ketones are present.  Avoid injecting insulin into areas of the body that are going to be exercised. For example, avoid injecting insulin into:  The arms when playing tennis.  The legs when  jogging.  Keep a record of:  Food intake before and after you exercise.  Expected peak times of insulin action.  Blood glucose levels before and after you exercise.  The type and amount of exercise you have done.  Review your records with your health care  provider. Your health care provider will help you to develop guidelines for adjusting food intake and insulin amounts before and after exercising.  If you take insulin or oral hypoglycemic agents, watch for signs and symptoms of hypoglycemia. They include:  Dizziness.  Shaking.  Sweating.  Chills.  Confusion.  Drink plenty of water while you exercise to prevent dehydration or heat stroke. Body water is lost during exercise and must be replaced.  Talk to your health care provider before starting an exercise program to make sure it is safe for you. Remember, almost any type of activity is better than none. Document Released: 12/19/2003 Document Revised: 02/12/2014 Document Reviewed: 03/07/2013 [facility] Patient Information 2015 [place], [place]. This information is not intended to replace advice given to you by your health care provider. Make sure you discuss any questions you have with your health care provider. Basic Carbohydrate Counting for Diabetes Mellitus Carbohydrate counting is a method for keeping track of the amount of carbohydrates you eat. Eating carbohydrates naturally increases the level of sugar (glucose) in your blood, so it is important for you to know the amount that is okay for you to have in every meal. Carbohydrate counting helps keep the level of glucose in your blood within normal limits. The amount of carbohydrates allowed is different for every person. A dietitian can help you calculate the amount that is right for you. Once you know the amount of carbohydrates you can have, you can count the carbohydrates in the foods you want to eat. Carbohydrates are found in the following foods:  Grains, such as breads  and cereals.  Dried beans and soy products.  Starchy vegetables, such as potatoes, peas, and corn.  Fruit and fruit juices.  Milk and yogurt.  Sweets and snack foods, such as cake, cookies, candy, chips, soft drinks, and fruit drinks. CARBOHYDRATE COUNTING There are two ways to count the carbohydrates in your food. You can use either of the methods or a combination of both. Reading the "Nutrition Facts" on [place] The "Nutrition Facts" is an area that is included on the labels of almost all packaged food and beverages in the [place]. It includes the serving size of that food or beverage and information about the nutrients in each serving of the food, including the grams (g) of carbohydrate per serving.  Decide the number of servings of this food or beverage that you will be able to eat or drink. Multiply that number of servings by the number of grams of carbohydrate that is listed on the label for that serving. The total will be the amount of carbohydrates you will be having when you eat or drink this food or beverage. Learning Standard Serving Sizes of Food When you eat food that is not packaged or does not include "Nutrition Facts" on the label, you need to measure the servings in order to count the amount of carbohydrates.A serving of most carbohydrate-rich foods contains about 15 g of carbohydrates. The following list includes serving sizes of carbohydrate-rich foods that provide 15 g ofcarbohydrate per serving:   1 slice of bread (1 oz) or 1 six-inch tortilla.    of a hamburger bun or English muffin.  4-6 crackers.   cup unsweetened dry cereal.    cup hot cereal.   cup rice or pasta.    cup mashed potatoes or  of a large baked potato.  1 cup fresh fruit or one small piece of fruit.    cup canned or frozen fruit  or fruit juice.  1 cup milk.   cup plain fat-free yogurt or yogurt sweetened with artificial sweeteners.   cup cooked dried beans or  starchy vegetable, such as peas, corn, or potatoes.  Decide the number of standard-size servings that you will eat. Multiply that number of servings by 15 (the grams of carbohydrates in that serving). For example, if you eat 2 cups of strawberries, you will have eaten 2 servings and 30 g of carbohydrates (2 servings x 15 g = 30 g). For foods such as soups and casseroles, in which more than one food is mixed in, you will need to count the carbohydrates in each food that is included. EXAMPLE OF CARBOHYDRATE COUNTING Sample Dinner  3 oz chicken breast.   cup of brown rice.   cup of corn.  1 cup milk.   1 cup strawberries with sugar-free whipped topping.  Carbohydrate Calculation Step 1: Identify the foods that contain carbohydrates:   Rice.   Corn.   Milk.   Strawberries. Step 2:Calculate the number of servings eaten of each:   2 servings of rice.   1 serving of corn.   1 serving of milk.   1 serving of strawberries. Step 3: Multiply each of those number of servings by 15 g:   2 servings of rice x 15 g = 30 g.   1 serving of corn x 15 g = 15 g.   1 serving of milk x 15 g = 15 g.   1 serving of strawberries x 15 g = 15 g. Step 4: Add together all of the amounts to find the total grams of carbohydrates eaten: 30 g + 15 g + 15 g + 15 g = 75 g. Document Released: 09/28/2005 Document Revised: 02/12/2014 Document Reviewed: 08/25/2013 [facility] Patient Information 2015 [place], [place]. This information is not intended to replace advice given to you by your health care provider. Make sure you discuss any questions you have with your health care provider. Type 2 Diabetes Mellitus Type 2 diabetes mellitus is a long-term (chronic) disease. In type 2 diabetes:  The pancreas does not make enough of a hormone called insulin.  The cells in the body do not respond as well to the insulin that is made.  Both of the above can happen. Normally, insulin moves sugars from  food into tissue cells. This gives you energy. If you have type 2 diabetes, sugars cannot be moved into tissue cells. This causes high blood sugar (hyperglycemia).  HOME CARE  Have your hemoglobin A1c level checked twice a year. The level shows if your diabetes is under control or out of control.  Test your blood sugar level every day as told by your doctor.  Check your ketone levels by testing your pee (urine) when you are sick and as told.  Take your diabetes or insulin medicine as told by your doctor.  Never run out of insulin.  Adjust how much insulin you give yourself based on how many carbs (carbohydrates) you eat. Carbs are in many foods, such as fruits, vegetables, whole grains, and dairy products.  Have a healthy snack between every healthy meal. Have 3 meals and 3 snacks a day.  Lose weight if you are overweight.  Carry a medical alert card or wear your medical alert jewelry.  Carry a 15-gram carb snack with you at all times. Examples include:  Glucose pills, 3 or 4.  Glucose gel, 15-gram tube.  Raisins, 2 tablespoons (24 grams).  Jelly  beans, 6.  Animal crackers, 8.  Regular (not diet) pop, 4 ounces (120 milliliters).  Gummy treats, 9.  Notice low blood sugar (hypoglycemia) symptoms, such as:  Shaking (tremors).  Trouble thinking clearly.  Sweating.  Faster heart rate.  Headache.  Dry mouth.  Hunger.  Crabbiness (irritability).  Being worried or tense (anxious).  Restless sleep.  A change in speech or coordination.  Confusion.  Treat low blood sugar right away. If you are alert and can swallow, follow the 15:15 rule:  Take 15-20 grams of a rapid-acting glucose or carb. This includes glucose gel, glucose pills, or 4 ounces (120 milliliters) of fruit juice, regular pop, or low-fat milk.  Check your blood sugar level 15 minutes after taking the glucose.  Take 15-20 grams more of glucose if the repeat blood sugar level is still 70 mg/dL  (milligrams/deciliter) or below.  Eat a meal or snack within 1 hour of the blood sugar levels going back to normal.  Notice early symptoms of high blood sugar, such as:  Being really thirsty or drinking a lot (polydipsia).  Peeing a lot (polyuria).  Do at least 150 minutes of physical activity a week or as told.  Split the 150 minutes of activity up during the week. Do not do 150 minutes of activity in one day.  Perform exercises, such as weight lifting, at least 2 times a week or as told.  Spend no more than 90 minutes at one time inactive.  Adjust your insulin or food intake as needed if you start a new exercise or sport.  Follow your sick-day plan when you are not able to eat or drink as usual.  Do not smoke, chew tobacco, or use electronic cigarettes.  Women who are not pregnant should drink no more than 1 drink a day. Men should drink no more than 2 drinks a day.  Only drink alcohol with food.  Ask your doctor if alcohol is safe for you.  Tell your doctor if you drink alcohol several times during the week.  See your doctor regularly.  Schedule an eye exam soon after you are told you have diabetes. Schedule exams once every year.  Check your skin and feet every day. Check for cuts, bruises, redness, nail problems, bleeding, blisters, or sores. A doctor should do a foot exam once a year.  Brush your teeth and gums twice a day. Floss once a day. Visit your dentist regularly.  Share your diabetes plan with your workplace or school.  Stay up-to-date with shots that fight against diseases (immunizations).  Learn how to deal with stress.  Get diabetes education and support as needed.  Ask your doctor for special help if:  You need help to maintain or improve how you do things on your own.  You need help to maintain or improve the quality of your life.  You have foot or hand problems.  You have trouble cleaning yourself, dressing, eating, or doing physical  activity. GET HELP IF:  You are unable to eat or drink for more than 6 hours.  You feel sick to your stomach (nauseous) or throw up (vomit) for more than 6 hours.  Your blood sugar level is over 240 mg/dL.  There is a change in mental status.  You get another serious illness.  You have watery poop (diarrhea) for more than 6 hours.  You have been sick or have had a fever for 2 or more days and are not getting better.  You have  pain when you are active. GET HELP RIGHT AWAY IF:  You have trouble breathing.  Your ketone levels are higher than your doctor says they should be. MAKE SURE YOU:  Understand these instructions.  Will watch your condition.  Will get help right away if you are not doing well or get worse. Document Released: 07/07/2008 Document Revised: 02/12/2014 Document Reviewed: 04/29/2012 [facility] Patient Information 2015 [place], [place]. This information is not intended to replace advice given to you by your health care provider. Make sure you discuss any questions you have with your health care provider.

## 2015-04-10 NOTE — Progress Notes (Signed)
Patient presents for BP check, CBG and record review for T2DM, however, patient did not bring log or meter Med list reviewed; patient reports taking glipizide 10 mg bid and lantus 20 units nightly Patient reports before lunch blood sugars ranging 212-399 Patient reports before dinner blood sugars ranging 250-480 Denies increased urination Positive for increased thirst when drinking water or diet soda C/o fatigue X 3-4 weeks and nausea X 3 days Walking 15-20 minutes 3 times weekly; encouraged to increase this to 30 minutes daily  CBG 212 AM fasting per patient CBG 328 2 hours after eating oatmeal and water with truvia 10 units novolog insulin given SQ per protocol  Urine negative for ketones   Lab Results  Component Value Date   HGBA1C >15.0 01/28/2015   Filed Vitals:   04/10/15 1053  BP: 114/72  Pulse: 91  Temp: 98 F (36.7 C)  Resp: 16    Per PCP: Increase lantus to 25 units at 10 pm Add Novolog sliding scale for Blood Sugar: 150-200 3 units 201-250 5 units 251-300 7 units > 350 10 units and call clinic  S/sx of hypoglycemia and immediate actions to take discussed in detail  Patient given monthly blood sugar log and instructed on use. Instructed to bring to all future visits.  Patient advised to call for med refills at least 7 days before running out so as not to go without.  Patient to return in 2 weeks for nurse visit for CBG and record review  Patient aware that she is to f/u with PCP 3 months from last visit. Due 05/15/2015  Patient given literature on T2DM Diabetes and Food, Diabetes and Exercise, Basic Carb Counting, Diabetes and Foot Care, Diabetic Neuropathy, Hypoglycemia  CBG 294 30 minutes after insulin admin. Patient discharged to home in stable condition.

## 2015-04-17 ENCOUNTER — Telehealth: Payer: Self-pay | Admitting: Internal Medicine

## 2015-04-17 NOTE — Telephone Encounter (Signed)
Pt. Called requesting both a nurse and pcp appointment, first available appointment for either would be in August, patient can only come in on Wednesday and Friday.Marland Kitchen...pt. Stated she would call back

## 2015-05-02 ENCOUNTER — Telehealth: Payer: Self-pay | Admitting: Internal Medicine

## 2015-05-02 NOTE — Telephone Encounter (Signed)
Patient came into office requesting medication refill on fenofibrate (TRICOR) 145 MG tablet. Please f/u

## 2015-05-07 ENCOUNTER — Other Ambulatory Visit: Payer: Self-pay

## 2015-05-27 ENCOUNTER — Ambulatory Visit: Payer: Self-pay | Attending: Internal Medicine

## 2015-05-29 ENCOUNTER — Other Ambulatory Visit: Payer: Self-pay | Admitting: Internal Medicine

## 2015-05-29 MED ORDER — "INSULIN SYRINGE-NEEDLE U-100 30G X 5/16"" 1 ML MISC": Status: DC

## 2015-06-13 ENCOUNTER — Other Ambulatory Visit: Payer: Self-pay | Admitting: Internal Medicine

## 2015-07-12 ENCOUNTER — Ambulatory Visit: Payer: Self-pay | Admitting: Family Medicine

## 2015-08-15 ENCOUNTER — Encounter (HOSPITAL_BASED_OUTPATIENT_CLINIC_OR_DEPARTMENT_OTHER): Payer: Self-pay | Admitting: Clinical

## 2015-08-15 ENCOUNTER — Ambulatory Visit: Payer: Self-pay | Attending: Internal Medicine | Admitting: Internal Medicine

## 2015-08-15 VITALS — BP 145/81 | HR 99 | Temp 97.7°F | Resp 18 | Ht 65.0 in | Wt 168.8 lb

## 2015-08-15 DIAGNOSIS — E1165 Type 2 diabetes mellitus with hyperglycemia: Secondary | ICD-10-CM | POA: Insufficient documentation

## 2015-08-15 DIAGNOSIS — F32A Depression, unspecified: Secondary | ICD-10-CM

## 2015-08-15 DIAGNOSIS — I10 Essential (primary) hypertension: Secondary | ICD-10-CM | POA: Insufficient documentation

## 2015-08-15 DIAGNOSIS — Z79899 Other long term (current) drug therapy: Secondary | ICD-10-CM | POA: Insufficient documentation

## 2015-08-15 DIAGNOSIS — E785 Hyperlipidemia, unspecified: Secondary | ICD-10-CM | POA: Insufficient documentation

## 2015-08-15 DIAGNOSIS — F329 Major depressive disorder, single episode, unspecified: Secondary | ICD-10-CM | POA: Insufficient documentation

## 2015-08-15 DIAGNOSIS — Z7982 Long term (current) use of aspirin: Secondary | ICD-10-CM | POA: Insufficient documentation

## 2015-08-15 DIAGNOSIS — Z794 Long term (current) use of insulin: Secondary | ICD-10-CM | POA: Insufficient documentation

## 2015-08-15 LAB — COMPLETE METABOLIC PANEL WITH GFR
ALBUMIN: 3.4 g/dL — AB (ref 3.6–5.1)
ALK PHOS: 103 U/L (ref 33–130)
ALT: 14 U/L (ref 6–29)
AST: 16 U/L (ref 10–35)
BUN: 39 mg/dL — AB (ref 7–25)
CALCIUM: 9 mg/dL (ref 8.6–10.4)
CHLORIDE: 100 mmol/L (ref 98–110)
CO2: 23 mmol/L (ref 20–31)
CREATININE: 1.45 mg/dL — AB (ref 0.50–1.05)
GFR, Est African American: 46 mL/min — ABNORMAL LOW (ref >=60)
GFR, Est Non African American: 40 mL/min — ABNORMAL LOW (ref >=60)
GLUCOSE: 267 mg/dL — AB (ref 65–99)
Potassium: 4.1 mmol/L (ref 3.5–5.3)
SODIUM: 133 mmol/L — AB (ref 135–146)
Total Bilirubin: 0.3 mg/dL (ref 0.2–1.2)
Total Protein: 6.8 g/dL (ref 6.1–8.1)

## 2015-08-15 LAB — LIPID PANEL
CHOLESTEROL: 424 mg/dL — AB (ref 125–200)
HDL: 41 mg/dL — ABNORMAL LOW (ref >=46)
Total CHOL/HDL Ratio: 10.3 Ratio — ABNORMAL HIGH (ref <=5.0)
Triglycerides: 707 mg/dL — ABNORMAL HIGH (ref <150)

## 2015-08-15 LAB — GLUCOSE, POCT (MANUAL RESULT ENTRY): POC Glucose: 299 mg/dl — AB (ref 70–99)

## 2015-08-15 LAB — POCT GLYCOSYLATED HEMOGLOBIN (HGB A1C)

## 2015-08-15 MED ORDER — GLIPIZIDE 10 MG PO TABS: 10.0000 mg | ORAL_TABLET | Freq: Two times a day (BID) | ORAL | Status: DC

## 2015-08-15 MED ORDER — ATORVASTATIN CALCIUM 10 MG PO TABS: 10.0000 mg | ORAL_TABLET | Freq: Every day | ORAL | Status: DC

## 2015-08-15 MED ORDER — ESCITALOPRAM OXALATE 10 MG PO TABS: 10.0000 mg | ORAL_TABLET | Freq: Every evening | ORAL | Status: DC | PRN

## 2015-08-15 MED ORDER — INSULIN GLARGINE 100 UNIT/ML SOLOSTAR PEN: 35.0000 [IU] | PEN_INJECTOR | Freq: Every day | SUBCUTANEOUS | Status: DC

## 2015-08-15 MED ORDER — HYDROCHLOROTHIAZIDE 25 MG PO TABS: 25.0000 mg | ORAL_TABLET | Freq: Every day | ORAL | Status: DC

## 2015-08-15 MED ORDER — FENOFIBRATE 145 MG PO TABS: 145.0000 mg | ORAL_TABLET | Freq: Every day | ORAL | Status: DC

## 2015-08-15 NOTE — Progress Notes (Signed)
ASSESSMENT: Pt currently experiencing symptoms of depression. Pt needs to f/u with PCP and [facility]; would benefit from psychoeducation and supportive counseling regarding coping with symptoms of depression.  Stage of Change: contemplative  PLAN: 1. F/U with behavioral health consultant in as needed 2. Psychiatric Medications: none 3. Behavioral recommendation(s):   -Talk to a supportive friend on the phone in next 24 hours -Plan on eating at least one healthy meal in next 24 hours -Consider importance of self-care to wellbeing -Consider reading educational materials regarding coping with symptoms of stress and depression SUBJECTIVE: Pt. referred by Dr Doreene Burke for symptoms of depression:  Pt. 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 Pt feels like her family is not available to her when she needs support. Pt says she has had issues with poor quality of sleep, eating unhealthy, feeling down, lack of time to herself, and excessive worry. She thinks that eating healthier (for her diabetes) and talking to some friends would help her to feel better.  Duration of problem: Increasing in past month Severity: moderate  OBJECTIVE: Orientation & Cognition: Oriented x3. Thought processes normal and appropriate to situation. Mood: teary. Affect: appropriate Appearance: appropriate Risk of harm to self or others: no known risk of harm to self or others Substance use: none Assessments administered: PHQ9: 12/ GAD7: unknown  Diagnosis: Depression CPT Code: F32.9 -------------------------------------------- Other(s) present in the room: none  Time spent with patient in exam room: 20 minutes

## 2015-08-15 NOTE — Patient Instructions (Signed)
DASH Eating Plan DASH stands for "Dietary Approaches to Stop Hypertension." The DASH eating plan is a healthy eating plan that has been shown to reduce high blood pressure (hypertension). Additional health benefits may include reducing the risk of type 2 diabetes mellitus, heart disease, and stroke. The DASH eating plan may also help with weight loss. WHAT DO I NEED TO KNOW ABOUT THE DASH EATING PLAN? For the DASH eating plan, you will follow these general guidelines:  Choose foods with a percent daily value for sodium of less than 5% (as listed on the food label).  Use salt-free seasonings or herbs instead of table salt or sea salt.  Check with your health care provider or pharmacist before using salt substitutes.  Eat lower-sodium products, often labeled as "lower sodium" or "no salt added."  Eat fresh foods.  Eat more vegetables, fruits, and low-fat dairy products.  Choose whole grains. Look for the word "whole" as the first word in the ingredient list.  Choose fish and skinless chicken or turkey more often than red meat. Limit fish, poultry, and meat to 6 oz (170 g) each day.  Limit sweets, desserts, sugars, and sugary drinks.  Choose heart-healthy fats.  Limit cheese to 1 oz (28 g) per day.  Eat more home-cooked food and less restaurant, buffet, and fast food.  Limit fried foods.  Cook foods using methods other than frying.  Limit canned vegetables. If you do use them, rinse them well to decrease the sodium.  When eating at a restaurant, ask that your food be prepared with less salt, or no salt if possible. WHAT FOODS CAN I EAT? Seek help from a dietitian for individual calorie needs. Grains Whole grain or whole wheat bread. Brown rice. Whole grain or whole wheat pasta. Quinoa, bulgur, and whole grain cereals. Low-sodium cereals. Corn or whole wheat flour tortillas. Whole grain cornbread. Whole grain crackers. Low-sodium crackers. Vegetables Fresh or frozen vegetables  (raw, steamed, roasted, or grilled). Low-sodium or reduced-sodium tomato and vegetable juices. Low-sodium or reduced-sodium tomato sauce and paste. Low-sodium or reduced-sodium canned vegetables.  Fruits All fresh, canned (in natural juice), or frozen fruits. Meat and Other Protein Products Ground beef (85% or leaner), grass-fed beef, or beef trimmed of fat. Skinless chicken or turkey. Ground chicken or turkey. Pork trimmed of fat. All fish and seafood. Eggs. Dried beans, peas, or lentils. Unsalted nuts and seeds. Unsalted canned beans. Dairy Low-fat dairy products, such as skim or 1% milk, 2% or reduced-fat cheeses, low-fat ricotta or cottage cheese, or plain low-fat yogurt. Low-sodium or reduced-sodium cheeses. Fats and Oils Tub margarines without trans fats. Light or reduced-fat mayonnaise and salad dressings (reduced sodium). Avocado. Safflower, olive, or canola oils. Natural peanut or almond butter. Other Unsalted popcorn and pretzels. The items listed above may not be a complete list of recommended foods or beverages. Contact your dietitian for more options. WHAT FOODS ARE NOT RECOMMENDED? Grains White bread. White pasta. White rice. Refined cornbread. Bagels and croissants. Crackers that contain trans fat. Vegetables Creamed or fried vegetables. Vegetables in a cheese sauce. Regular canned vegetables. Regular canned tomato sauce and paste. Regular tomato and vegetable juices. Fruits Dried fruits. Canned fruit in light or heavy syrup. Fruit juice. Meat and Other Protein Products Fatty cuts of meat. Ribs, chicken wings, bacon, sausage, bologna, salami, chitterlings, fatback, hot dogs, bratwurst, and packaged luncheon meats. Salted nuts and seeds. Canned beans with salt. Dairy Whole or 2% milk, cream, half-and-half, and cream cheese. Whole-fat or sweetened yogurt. Full-fat   cheeses or blue cheese. Nondairy creamers and whipped toppings. Processed cheese, cheese spreads, or cheese  curds. Condiments Onion and garlic salt, seasoned salt, table salt, and sea salt. Canned and packaged gravies. Worcestershire sauce. Tartar sauce. Barbecue sauce. Teriyaki sauce. Soy sauce, including reduced sodium. Steak sauce. Fish sauce. Oyster sauce. Cocktail sauce. Horseradish. Ketchup and mustard. Meat flavorings and tenderizers. Bouillon cubes. Hot sauce. Tabasco sauce. Marinades. Taco seasonings. Relishes. Fats and Oils Butter, stick margarine, lard, shortening, ghee, and bacon fat. Coconut, palm kernel, or palm oils. Regular salad dressings. Other Pickles and olives. Salted popcorn and pretzels. The items listed above may not be a complete list of foods and beverages to avoid. Contact your dietitian for more information. WHERE CAN I FIND MORE INFORMATION? [employer], [employer]: www.nhlbi.nih.gov/health/health-topics/topics/dash/   This information is not intended to replace advice given to you by your health care provider. Make sure you discuss any questions you have with your health care provider.   Document Released: 09/17/2011 Document Revised: 10/19/2014 Document Reviewed: 08/02/2013 Elsevier Interactive Patient Education 2016 [employer] Hypertension Hypertension, commonly called high blood pressure, is when the force of blood pumping through your arteries is too strong. Your arteries are the blood vessels that carry blood from your heart throughout your body. A blood pressure reading consists of a higher number over a lower number, such as 110/72. The higher number (systolic) is the pressure inside your arteries when your heart pumps. The lower number (diastolic) is the pressure inside your arteries when your heart relaxes. Ideally you want your blood pressure below 120/80. Hypertension forces your heart to work harder to pump blood. Your arteries may become narrow or stiff. Having untreated or uncontrolled hypertension can cause heart attack, stroke, kidney  disease, and other problems. RISK FACTORS Some risk factors for high blood pressure are controllable. Others are not.  Risk factors you cannot control include:   Race. You may be at higher risk if you are African American.  Age. Risk increases with age.  Gender. Men are at higher risk than women before age 45 years. After age 65, women are at higher risk than men. Risk factors you can control include:  Not getting enough exercise or physical activity.  Being overweight.  Getting too much fat, sugar, calories, or salt in your diet.  Drinking too much alcohol. SIGNS AND SYMPTOMS Hypertension does not usually cause signs or symptoms. Extremely high blood pressure (hypertensive crisis) may cause headache, anxiety, shortness of breath, and nosebleed. DIAGNOSIS To check if you have hypertension, your health care provider will measure your blood pressure while you are seated, with your arm held at the level of your heart. It should be measured at least twice using the same arm. Certain conditions can cause a difference in blood pressure between your right and left arms. A blood pressure reading that is higher than normal on one occasion does not mean that you need treatment. If it is not clear whether you have high blood pressure, you may be asked to return on a different day to have your blood pressure checked again. Or, you may be asked to monitor your blood pressure at home for 1 or more weeks. TREATMENT Treating high blood pressure includes making lifestyle changes and possibly taking medicine. Living a healthy lifestyle can help lower high blood pressure. You may need to change some of your habits. Lifestyle changes may include:  Following the DASH diet. This diet is high in fruits, vegetables, and whole grains.   It is low in salt, red meat, and added sugars.  Keep your sodium intake below 2,300 mg per day.  Getting at least 30-45 minutes of aerobic exercise at least 4 times per  week.  Losing weight if necessary.  Not smoking.  Limiting alcoholic beverages.  Learning ways to reduce stress. Your health care provider may prescribe medicine if lifestyle changes are not enough to get your blood pressure under control, and if one of the following is true:  You are 18-59 years of age and your systolic blood pressure is above 140.  You are 60 years of age or older, and your systolic blood pressure is above 150.  Your diastolic blood pressure is above 90.  You have diabetes, and your systolic blood pressure is over 140 or your diastolic blood pressure is over 90.  You have kidney disease and your blood pressure is above 140/90.  You have heart disease and your blood pressure is above 140/90. Your personal target blood pressure may vary depending on your medical conditions, your age, and other factors. HOME CARE INSTRUCTIONS  Have your blood pressure rechecked as directed by your health care provider.   Take medicines only as directed by your health care provider. Follow the directions carefully. Blood pressure medicines must be taken as prescribed. The medicine does not work as well when you skip doses. Skipping doses also puts you at risk for problems.  Do not smoke.   Monitor your blood pressure at home as directed by your health care provider. SEEK MEDICAL CARE IF:   You think you are having a reaction to medicines taken.  You have recurrent headaches or feel dizzy.  You have swelling in your ankles.  You have trouble with your vision. SEEK IMMEDIATE MEDICAL CARE IF:  You develop a severe headache or confusion.  You have unusual weakness, numbness, or feel faint.  You have severe chest or abdominal pain.  You vomit repeatedly.  You have trouble breathing. MAKE SURE YOU:   Understand these instructions.  Will watch your condition.  Will get help right away if you are not doing well or get worse.   This information is not intended to  replace advice given to you by your health care provider. Make sure you discuss any questions you have with your health care provider.   Document Released: 09/28/2005 Document Revised: 02/12/2015 Document Reviewed: 07/21/2013 Elsevier Interactive Patient Education 2016 [employer] Diabetes and Exercise Exercising regularly is important. It is not just about losing weight. It has many health benefits, such as:  Improving your overall fitness, flexibility, and endurance.  Increasing your bone density.  Helping with weight control.  Decreasing your body fat.  Increasing your muscle strength.  Reducing stress and tension.  Improving your overall health. People with diabetes who exercise gain additional benefits because exercise:  Reduces appetite.  Improves the body's use of blood sugar (glucose).  Helps lower or control blood glucose.  Decreases blood pressure.  Helps control blood lipids (such as cholesterol and triglycerides).  Improves the body's use of the hormone insulin by:  Increasing the body's insulin sensitivity.  Reducing the body's insulin needs.  Decreases the risk for heart disease because exercising:  Lowers cholesterol and triglycerides levels.  Increases the levels of good cholesterol (such as high-density lipoproteins [HDL]) in the body.  Lowers blood glucose levels. YOUR ACTIVITY PLAN  Choose an activity that you enjoy, and set realistic goals. To exercise safely, you should begin practicing any new physical   activity slowly, and gradually increase the intensity of the exercise over time. Your health care provider or diabetes educator can help create an activity plan that works for you. General recommendations include:  Encouraging children to engage in at least 60 minutes of physical activity each day.  Stretching and performing strength training exercises, such as yoga or weight lifting, at least 2 times per week.  Performing a total of at least  150 minutes of moderate-intensity exercise each week, such as brisk walking or water aerobics.  Exercising at least 3 days per week, making sure you allow no more than 2 consecutive days to pass without exercising.  Avoiding long periods of inactivity (90 minutes or more). When you have to spend an extended period of time sitting down, take frequent breaks to walk or stretch. RECOMMENDATIONS FOR EXERCISING WITH TYPE 1 OR TYPE 2 DIABETES   Check your blood glucose before exercising. If blood glucose levels are greater than 240 mg/dL, check for urine ketones. Do not exercise if ketones are present.  Avoid injecting insulin into areas of the body that are going to be exercised. For example, avoid injecting insulin into:  The arms when playing tennis.  The legs when jogging.  Keep a record of:  Food intake before and after you exercise.  Expected peak times of insulin action.  Blood glucose levels before and after you exercise.  The type and amount of exercise you have done.  Review your records with your health care provider. Your health care provider will help you to develop guidelines for adjusting food intake and insulin amounts before and after exercising.  If you take insulin or oral hypoglycemic agents, watch for signs and symptoms of hypoglycemia. They include:  Dizziness.  Shaking.  Sweating.  Chills.  Confusion.  Drink plenty of water while you exercise to prevent dehydration or heat stroke. Body water is lost during exercise and must be replaced.  Talk to your health care provider before starting an exercise program to make sure it is safe for you. Remember, almost any type of activity is better than none.   This information is not intended to replace advice given to you by your health care provider. Make sure you discuss any questions you have with your health care provider.   Document Released: 12/19/2003 Document Revised: 02/12/2015 Document Reviewed:  03/07/2013 Elsevier Interactive Patient Education 2016 [employer] Basic Carbohydrate Counting for Diabetes Mellitus Carbohydrate counting is a method for keeping track of the amount of carbohydrates you eat. Eating carbohydrates naturally increases the level of sugar (glucose) in your blood, so it is important for you to know the amount that is okay for you to have in every meal. Carbohydrate counting helps keep the level of glucose in your blood within normal limits. The amount of carbohydrates allowed is different for every person. A dietitian can help you calculate the amount that is right for you. Once you know the amount of carbohydrates you can have, you can count the carbohydrates in the foods you want to eat. Carbohydrates are found in the following foods:  Grains, such as breads and cereals.  Dried beans and soy products.  Starchy vegetables, such as potatoes, peas, and corn.  Fruit and fruit juices.  Milk and yogurt.  Sweets and snack foods, such as cake, cookies, candy, chips, soft drinks, and fruit drinks. CARBOHYDRATE COUNTING There are two ways to count the carbohydrates in your food. You can use either of the methods or a combination of   both. Reading the "Nutrition Facts" on Packaged Food The "Nutrition Facts" is an area that is included on the labels of almost all packaged food and beverages in the [place]. It includes the serving size of that food or beverage and information about the nutrients in each serving of the food, including the grams (g) of carbohydrate per serving.  Decide the number of servings of this food or beverage that you will be able to eat or drink. Multiply that number of servings by the number of grams of carbohydrate that is listed on the label for that serving. The total will be the amount of carbohydrates you will be having when you eat or drink this food or beverage. Learning Standard Serving Sizes of Food When you eat food that is not  packaged or does not include "Nutrition Facts" on the label, you need to measure the servings in order to count the amount of carbohydrates.A serving of most carbohydrate-rich foods contains about 15 g of carbohydrates. The following list includes serving sizes of carbohydrate-rich foods that provide 15 g ofcarbohydrate per serving:   1 slice of bread (1 oz) or 1 six-inch tortilla.    of a hamburger bun or English muffin.  4-6 crackers.   cup unsweetened dry cereal.    cup hot cereal.   cup rice or pasta.    cup mashed potatoes or  of a large baked potato.  1 cup fresh fruit or one small piece of fruit.    cup canned or frozen fruit or fruit juice.  1 cup milk.   cup plain fat-free yogurt or yogurt sweetened with artificial sweeteners.   cup cooked dried beans or starchy vegetable, such as peas, corn, or potatoes.  Decide the number of standard-size servings that you will eat. Multiply that number of servings by 15 (the grams of carbohydrates in that serving). For example, if you eat 2 cups of strawberries, you will have eaten 2 servings and 30 g of carbohydrates (2 servings x 15 g = 30 g). For foods such as soups and casseroles, in which more than one food is mixed in, you will need to count the carbohydrates in each food that is included. EXAMPLE OF CARBOHYDRATE COUNTING Sample Dinner  3 oz chicken breast.   cup of brown rice.   cup of corn.  1 cup milk.   1 cup strawberries with sugar-free whipped topping.  Carbohydrate Calculation Step 1: Identify the foods that contain carbohydrates:   Rice.   Corn.   Milk.   Strawberries. Step 2:Calculate the number of servings eaten of each:   2 servings of rice.   1 serving of corn.   1 serving of milk.   1 serving of strawberries. Step 3: Multiply each of those number of servings by 15 g:   2 servings of rice x 15 g = 30 g.   1 serving of corn x 15 g = 15 g.   1 serving of milk x 15  g = 15 g.   1 serving of strawberries x 15 g = 15 g. Step 4: Add together all of the amounts to find the total grams of carbohydrates eaten: 30 g + 15 g + 15 g + 15 g = 75 g.   This information is not intended to replace advice given to you by your health care provider. Make sure you discuss any questions you have with your health care provider.   Document Released: 09/28/2005 Document Revised: 10/19/2014 Document   Reviewed: 08/25/2013 Elsevier Interactive Patient Education 2016 [employer]  

## 2015-08-15 NOTE — Progress Notes (Signed)
Patient ID: Patricia [name], female   DOB: Feb 05, 1956, 59 y.o.   MRN: [id number]   Patricia [name], is a 59 y.o. female  ZNB:[id number]  UDT:[id number]  DOB - [birth year]  Chief Complaint  Patient presents with  . Follow-up        Subjective:   Patricia [name] is a 59 y.o. female with history of type 2 diabetes mellitus with diabetic nephropathy, hypertension and obesity here today for a follow up visit. Patient is complaining of pain around the left hip joint, chronic, scale at 8 out of 10 today. Patient also needs refills on some of her medications. Patient is complaining of being depressed as a result of a lot of stressful conditions going on complicated by her medical conditions. Patient has not been taking her medications for diabetes and hypertension. Her blood sugar has been running high. Patient has No headache, No chest pain, No abdominal pain - No o new weakness tingling or numbness, No Cough - SOB.  No problems updated.  ALLERGIES: Allergies  Allergen Reactions  . Sulfa Antibiotics Itching and Rash    PAST MEDICAL HISTORY: Past Medical History  Diagnosis Date  . Diabetes mellitus without complication   . Hypertension   . Obesity     MEDICATIONS AT HOME: Prior to Admission medications   Medication Sig Start Date End Date Taking? Authorizing Provider  aspirin EC 81 MG tablet Take 81 mg by mouth daily.   Yes Historical Provider, MD  atorvastatin (LIPITOR) 10 MG tablet Take 1 tablet (10 mg total) by mouth daily. 08/15/15  Yes Tresa Garter, MD  escitalopram (LEXAPRO) 10 MG tablet Take 1 tablet (10 mg total) by mouth at bedtime as needed (anxiety/ insomnia). 08/15/15  Yes Lacey Wallman Essie Christine, MD  glipiZIDE (GLUCOTROL) 10 MG tablet Take 1 tablet (10 mg total) by mouth 2 (two) times daily before a meal. 08/15/15  Yes Eschol Auxier E Doreene Burke, MD  glucose monitoring kit (FREESTYLE) monitoring kit 1 each by Does not apply route as needed for other. 10/23/13  Yes Tresa Garter, MD    hydrochlorothiazide (HYDRODIURIL) 25 MG tablet Take 1 tablet (25 mg total) by mouth daily. 08/15/15  Yes Tresa Garter, MD  insulin aspart (NOVOLOG) 100 UNIT/ML injection Sliding scale: Inject 3 units for blood sugar 150-200 Inject 5 units for blood sugar 201-250 Inject 7 units for blood sugar 251-300 Inject 10 units for blood sugar > 350 and call clinic 04/10/15  Yes Tresa Garter, MD  Insulin Glargine (LANTUS SOLOSTAR) 100 UNIT/ML Solostar Pen Inject 35 Units into the skin daily at 10 pm. 08/15/15  Yes Bobbi Kozakiewicz E Doreene Burke, MD  Insulin Pen Needle 29G X 12.7MM MISC 10 Units by Does not apply route 1 day or 1 dose. 01/28/15  Yes Lance Bosch, NP  Insulin Syringe-Needle U-100 30G X 5/16" 1 ML MISC Test 3 times daily 05/29/15  Yes Starlet Gallentine E Doreene Burke, MD  fenofibrate (TRICOR) 145 MG tablet Take 1 tablet (145 mg total) by mouth daily. 08/15/15   Tresa Garter, MD  HYDROcodone-acetaminophen (NORCO) 5-325 MG per tablet Take 2 tablets by mouth every 4 (four) hours as needed. Patient not taking: Reported on 04/10/2015 11/04/14   Ernestina Patches, MD  Vitamin D, Ergocalciferol, (DRISDOL) 50000 UNITS CAPS capsule Take 1 capsule (50,000 Units total) by mouth every 7 (seven) days. Patient not taking: Reported on 04/10/2015 12/03/14   Tresa Garter, MD     Objective:   Filed Vitals:   08/15/15 1132  BP: 145/81  Pulse: 99  Temp: 97.7 F (36.5 C)  TempSrc: Oral  Resp: 18  Height: 5' 5"  (1.651 m)  Weight: 168 lb 12.8 oz (76.567 kg)  SpO2: 100%    Exam General appearance : Awake, alert, not in any distress. Speech Clear. Not toxic looking HEENT: Atraumatic and Normocephalic, pupils equally reactive to light and accomodation Neck: supple, no JVD. No cervical lymphadenopathy.  Chest:Good air entry bilaterally, no added sounds  CVS: S1 S2 regular, no murmurs.  Abdomen: Bowel sounds present, Non tender and not distended with no gaurding, rigidity or rebound. Extremities: B/L Lower  Ext shows no edema, both legs are warm to touch Neurology: Awake alert, and oriented X 3, CN II-XII intact, Non focal Skin:No Rash  Data Review Lab Results  Component Value Date   HGBA1C >15.0 08/15/2015   HGBA1C >15.0 01/28/2015   HGBA1C 8.7 10/01/2014     Assessment & Plan   1. Type 2 diabetes mellitus with hyperglycemia, with long-term current use of insulin (HCC)  - POCT A1C - Glucose (CBG) - Flu Vaccine QUAD 36+ mos PF IM (Fluarix & Fluzone Quad PF) Add - glipiZIDE (GLUCOTROL) 10 MG tablet; Take 1 tablet (10 mg total) by mouth 2 (two) times daily before a meal.  Dispense: 180 tablet; Refill: 3 - Insulin Glargine (LANTUS SOLOSTAR) 100 UNIT/ML Solostar Pen; Inject 35 Units into the skin daily at 10 pm.  Dispense: 15 mL; Refill: 3 - COMPLETE METABOLIC PANEL WITH GFR - Lipid panel - Ambulatory referral to Ophthalmology  Aim for 30 minutes of exercise most days. Rethink what you drink. Water is great! Aim for 2-3 Carb Choices per meal (30-45 grams) +/- 1 either way  Aim for 0-15 Carbs per snack if hungry  Include protein in moderation with your meals and snacks  Consider reading food labels for Total Carbohydrate and Fat Grams of foods  Consider checking BG at alternate times per day  Continue taking medication as directed Be mindful about how much sugar you are adding to beverages and other foods. Fruit Punch - find one with no sugar  Measure and decrease portions of carbohydrate foods  Make your plate and don't go back for seconds  2. Essential hypertension  - hydrochlorothiazide (HYDRODIURIL) 25 MG tablet; Take 1 tablet (25 mg total) by mouth daily.  Dispense: 90 tablet; Refill: 3  We have discussed target BP range and blood pressure goal. I have advised patient to check BP regularly and to call us back or report to clinic if the numbers are consistently higher than 140/90. We discussed the importance of compliance with medical therapy and DASH diet recommended,  consequences of uncontrolled hypertension discussed.   - continue current BP medications  3. Depression  - escitalopram (LEXAPRO) 10 MG tablet; Take 1 tablet (10 mg total) by mouth at bedtime as needed (anxiety/ insomnia).  Dispense: 30 tablet; Refill: 3 - Referred to our LCSW today for counseling and support  4. Dyslipidemia  - atorvastatin (LIPITOR) 10 MG tablet; Take 1 tablet (10 mg total) by mouth daily.  Dispense: 90 tablet; Refill: 3 - fenofibrate (TRICOR) 145 MG tablet; Take 1 tablet (145 mg total) by mouth daily.  Dispense: 30 tablet; Refill: 3  To address this please limit saturated fat to no more than 7% of your calories, limit cholesterol to 200 mg/day, increase fiber and exercise as tolerated. If needed we may add another cholesterol lowering medication to your regimen.   Patient have been counseled extensively about  nutrition and exercise  Return in about 2 weeks (around 08/29/2015) for CPP [facility] for BP/CBG and DM management.  The patient was given clear instructions to go to ER or return to medical center if symptoms don't improve, worsen or new problems develop. The patient verbalized understanding. The patient was told to call to get lab results if they haven't heard anything in the next week.   This note has been created with Surveyor, quantity. Any transcriptional errors are unintentional.    Angelica Chessman, MD, [place], [place], [place], [place] and [place], [place]   08/15/2015, 12:20 PM

## 2015-08-15 NOTE — Progress Notes (Signed)
Patient here for DM F/U.  Patient complains of pain in hip on left side. Pain has been present for "awhile". Scaled at an 8.  Patient requesting statin refill, pen needles lancets

## 2015-08-16 ENCOUNTER — Telehealth: Payer: Self-pay | Admitting: *Deleted

## 2015-08-16 NOTE — Telephone Encounter (Signed)
Patient verified DOB Patient advised of her kidney function being low but stable over the last year. Patient informed to control blood sugar levels closer to normal range. Patient was encouraged to take cholesterol medication and implement some diet chagnes such as limiting saturated fats and increasing fiber and excersise. Patient reminded of her two week blood sugar recheck and was advised to bring log with her. Patient informed of possibly needing a cholesterol medication being added to her regimen if the results to not come down with the next check.  Patient has not picked up cholesterol medication yet. Medical Assistant informed patient of pharmacy hours as well as providing a diabetic log for her to transfer her numbers into.  Patient had no further questions and expressed her understanding.

## 2015-08-16 NOTE — Telephone Encounter (Signed)
-----   Message from Tresa Garter, MD sent at 08/16/2015  2:10 PM EDT ----- Please inform patient that her kidney function is still low but stable over the last year. Need to control blood sugar as close to normal range as possible, cholesterol is very high, encourage patient to adhere strictly with her medications as prescribed, and also to address this please limit saturated fat to no more than 7% of your calories, limit cholesterol to 200 mg/day, increase fiber and exercise as tolerated. If needed we may add another cholesterol lowering medication to your regimen. Reminded patient to return to clinic in 2 weeks for blood sugar check, please bring blood sugar log while coming to see CPP

## 2015-09-03 ENCOUNTER — Encounter: Payer: Self-pay | Admitting: Pharmacist

## 2015-09-03 ENCOUNTER — Other Ambulatory Visit: Payer: Self-pay

## 2015-09-04 ENCOUNTER — Ambulatory Visit: Payer: Self-pay | Attending: Internal Medicine | Admitting: Pharmacist

## 2015-09-04 ENCOUNTER — Ambulatory Visit (HOSPITAL_BASED_OUTPATIENT_CLINIC_OR_DEPARTMENT_OTHER): Payer: Self-pay | Admitting: Clinical

## 2015-09-04 DIAGNOSIS — F32A Depression, unspecified: Secondary | ICD-10-CM

## 2015-09-04 DIAGNOSIS — E1165 Type 2 diabetes mellitus with hyperglycemia: Secondary | ICD-10-CM | POA: Insufficient documentation

## 2015-09-04 DIAGNOSIS — F329 Major depressive disorder, single episode, unspecified: Secondary | ICD-10-CM

## 2015-09-04 DIAGNOSIS — Z794 Long term (current) use of insulin: Secondary | ICD-10-CM | POA: Insufficient documentation

## 2015-09-04 DIAGNOSIS — F418 Other specified anxiety disorders: Secondary | ICD-10-CM

## 2015-09-04 DIAGNOSIS — F419 Anxiety disorder, unspecified: Principal | ICD-10-CM

## 2015-09-04 MED ORDER — TRUEPLUS LANCETS 28G MISC: Status: DC

## 2015-09-04 MED ORDER — GLUCOSE BLOOD VI STRP: ORAL_STRIP | Status: DC

## 2015-09-04 MED ORDER — TRUE METRIX METER W/DEVICE KIT: PACK | Status: DC

## 2015-09-04 MED ORDER — INSULIN GLARGINE 100 UNIT/ML SOLOSTAR PEN: 38.0000 [IU] | PEN_INJECTOR | Freq: Every day | SUBCUTANEOUS | Status: DC

## 2015-09-04 NOTE — Patient Instructions (Signed)
Thanks for coming to see me today!  Increase the Lantus to 38 units every day  Come back and see me in 2 weeks with your blood sugar log.  Check first thing in the morning and then a few 2 hours after you eat. We will focus on that fasting blood sugar first and getting it <120.

## 2015-09-04 NOTE — Progress Notes (Signed)
S:    Patient arrives in good spirits.  Presents for diabetes follow up.   Patient reports adherence with medications. Current diabetes medications include Lantus 35 units daily, Novolog sliding scale and glipizde 10 mg BID.   Patient denies hypoglycemic events.  Patient reported dietary habits: patient reports recent stress that has impacted her eating habits. She also typically does not eat lunch due to work.  Patient reported exercise habits: none   Patient reports nocturia.  Patient reports neuropathy. Patient denies visual changes. Patient reports self foot exams.   Patient reports stress from other issues as well as diabetes management. She followed up with social work today and reports that this greatly helped. She is excited to closely follow up with her diabetes management to get her blood glucose readings under control.    O:  Lab Results  Component Value Date   HGBA1C >15.0 08/15/2015    Home fasting CBG: 150s - 170s  2 hour post-prandial/random CBG: 140s - 300s, one excursion to the 400s  A/P: Diabetes currently uncontrolled based on A1c of >150 and home blood glucose readings.   Patient denies hypoglycemic events and is able to verbalize appropriate hypoglycemia management plan.  Patient reports adherence with medication. Control is suboptimal due to sedentary lifestyle and dietary indiscretion.  Increased dose of basal insulin Lantus (insulin glargine) to 38 units daily. Continued rapid insulin Novolog (insulin aspart) sliding scale for now. Will also continue glipizide 10 mg BID, but will anticipate discontinuing it once patient approaches goal blood glucoses. Will closely monitor her blood glucoses - she had a lot of readings that were the same and I am not sure they are all true readings.  Next A1C anticipated February 2017.    Medication reconciliation completed. Written patient instructions provided.  Total time in face to face counseling 20 minutes.  Follow up  in Pharmacist Clinic Visit in 2 weeks.

## 2015-09-04 NOTE — Progress Notes (Signed)
ASSESSMENT: Pt currently experiencing symptoms of anxiety and depression. Pt needs to f/u with PCP as needed, and would benefit from continued supportive counseling regarding coping with symptoms of anxiety and depression, as well as community resources.  Stage of Change: action  PLAN: 1. F/U with behavioral health consultant in as needed 2. Psychiatric Medications: none. 3. Behavioral recommendation(s):   -Take grandchildren to [place] next Wednesday Family Night Free 5-7pm -Take grandchildren to downtown [employer] after holiday lights are up -Consider taking grandchildren to [employer] first Friday night in December for Family Night at $2 each  -Do daily relaxation exercises, as practiced in office visit SUBJECTIVE: Pt. referred by Dr Doreene Burke for symptoms of depression:  Pt. 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 She says she does not want medication for depression or anxiety, and thinks that planning times to get out and do fun things with the grandchildren would help her to feel better, but finances are limited.  Duration of problem: at least two months Severity: moderate/severe  OBJECTIVE: Orientation & Cognition: Oriented x3. Thought processes normal and appropriate to situation. Mood: appropriate. Affect: appropriate Appearance: appropriate Risk of harm to self or others: no known risk of harm to self or others Substance use: none Assessments administered: PHQ9: 15/ GAD7: 18  Diagnosis: Anxiety and depression CPT Code: F41.8 -------------------------------------------- Other(s) present in the room: none  Time spent with patient in exam room: 30 minutes

## 2015-09-18 ENCOUNTER — Ambulatory Visit: Payer: Self-pay | Attending: Internal Medicine | Admitting: Pharmacist

## 2015-09-18 DIAGNOSIS — E1165 Type 2 diabetes mellitus with hyperglycemia: Secondary | ICD-10-CM | POA: Insufficient documentation

## 2015-09-18 DIAGNOSIS — Z794 Long term (current) use of insulin: Secondary | ICD-10-CM | POA: Insufficient documentation

## 2015-09-18 MED ORDER — INSULIN GLARGINE 100 UNIT/ML SOLOSTAR PEN: 44.0000 [IU] | PEN_INJECTOR | Freq: Every day | SUBCUTANEOUS | Status: DC

## 2015-09-18 NOTE — Progress Notes (Signed)
S:    Patient arrives in good spirits.  Presents for diabetes follow up.   Patient reports adherence with medications. Current diabetes medications include Lantus 38 units daily, Novolog sliding scale and glipizide 10 mg BID.   Patient denies hypoglycemic events.  Patient reported dietary habits: no changes  Patient reported exercise habits: none   Patient reports nocturia 3 times per night.  Patient reports neuropathy on the top of her feet. Patient denies visual changes. Patient reports self foot exams.   Patient reports significant stress, especially because her brother is having surgery today. She feels like this may be why her blood sugars have not improved.   O:  Lab Results  Component Value Date   HGBA1C >15.0 08/15/2015    Home fasting CBG: 170s - 200s (1 excursion to 280)  2 hour post-prandial/random CBG: 250 - 365  A/P: Diabetes currently uncontrolled based on A1c of >15.0 and home blood glucose readings.   Patient denies hypoglycemic events and is able to verbalize appropriate hypoglycemia management plan.  Patient reports adherence with medication. Control is suboptimal due to sedentary lifestyle and dietary indiscretion.  Increased dose of basal insulin Lantus (insulin glargine) to 44 units daily. Continued rapid insulin Novolog (insulin aspart) sliding scale for now. Will also continue glipizide 10 mg BID. Her readings were more varied this time so I think that she just does not have a lot of fluctuations in her readings which is very good. I will continue to titrate her Lantus until she gets her fastings to goal (<120) and then will focus on post-prandial readings. Patient to call office with any hypoglycemia.  Next A1C anticipated February 2017.    Medication reconciliation completed. Written patient instructions provided.  Total time in face to face counseling 20 minutes.  Follow up in Pharmacist Clinic Visit in 2 weeks.

## 2015-09-18 NOTE — Patient Instructions (Signed)
Thanks for coming to see me today!  Hang in there the best you can - I know you are under a lot of stress! But you are doing a good job!  Increase the Lantus to 44 units every day.  Come back in 2 weeks for diabetes follow up

## 2015-10-01 ENCOUNTER — Ambulatory Visit: Payer: Self-pay | Attending: Internal Medicine | Admitting: Pharmacist

## 2015-10-01 DIAGNOSIS — E1165 Type 2 diabetes mellitus with hyperglycemia: Secondary | ICD-10-CM | POA: Insufficient documentation

## 2015-10-01 DIAGNOSIS — Z794 Long term (current) use of insulin: Secondary | ICD-10-CM | POA: Insufficient documentation

## 2015-10-01 LAB — GLUCOSE, POCT (MANUAL RESULT ENTRY)
POC GLUCOSE: 393 mg/dL — AB (ref 70–99)
POC Glucose: 353 mg/dl — AB (ref 70–99)

## 2015-10-01 LAB — POCT UA - GLUCOSE/PROTEIN
GLUCOSE UA: 500
Protein, UA: NEGATIVE

## 2015-10-01 MED ORDER — INSULIN ASPART 100 UNIT/ML ~~LOC~~ SOLN
20.0000 [IU] | Freq: Once | SUBCUTANEOUS | Status: AC
  Administered 2015-10-01: 20 [IU] via SUBCUTANEOUS

## 2015-10-01 MED ORDER — INSULIN GLARGINE 100 UNIT/ML SOLOSTAR PEN: 50.0000 [IU] | PEN_INJECTOR | Freq: Every day | SUBCUTANEOUS | Status: DC

## 2015-10-01 NOTE — Patient Instructions (Addendum)
Go home and take your Novolog  Sliding scale: Inject 3 units for blood sugar 150-200 Inject 5 units for blood sugar 201-250 Inject 7 units for blood sugar 251-300 Inject 10 units for blood sugar > 350 and call clinic  Increase Lantus to 50 units daily.   Come back and see me in 2-3 weeks.

## 2015-10-01 NOTE — Progress Notes (Signed)
S:    Patient arrives in good spirits.  Presents for diabetes follow up.   Patient reports adherence with medications. Current diabetes medications include Lantus 44 units daily, Novolog sliding scale and glipizide 10 mg BID. However, she doesn't take the Novolog except maybe once over the last few weeks.  Patient denies hypoglycemic events.  Patient reported dietary habits: no changes  Patient reported exercise habits: none   Patient reports nocturia 3 times per night.  Patient reports neuropathy on the top of her feet. Patient denies visual changes. Patient reports self foot exams.   Patient reports significant stress, especially because two of her friends passed away this week.   O:  Lab Results  Component Value Date   HGBA1C >15.0 08/15/2015    Home fasting CBG: 170s - 200s (2 excursions to 260, 280)  2 hour post-prandial/random CBG: 269 - 478 (higher in last week)  POCT glucose = 353; 393  A/P: Diabetes currently uncontrolled based on A1c of >15.0 and home blood glucose readings.   Patient denies hypoglycemic events and is able to verbalize appropriate hypoglycemia management plan.  Patient reports adherence with medication. Control is suboptimal due to sedentary lifestyle and dietary indiscretion.  Administered 20 units of insulin aspart in clinic. Obtained urine ketones, which was negative. Blood glucose actually went up but I think that is because I had to use a different meter. Patient refused to wait longer and request to leave. She is to check her blood glucose at home and administer Novolog as directed starting this evening.  Increased dose of basal insulin Lantus (insulin glargine) to 50 units daily. Continued rapid insulin Novolog (insulin aspart) sliding scale for now and instructed patient to use it as directed. Will also continue glipizide 10 mg BID. I will continue to titrate her Lantus until she gets her fastings to goal (<120) and then will focus on  post-prandial readings. Patient to call office with any hypoglycemia. Patient verbalized understanding.  Next A1C anticipated February 2017.    Medication reconciliation completed. Written patient instructions provided.  Total time in face to face counseling 35 minutes.  Follow up in Pharmacist Clinic Visit in 2 weeks.

## 2015-10-18 MED FILL — HYDROCHLOROTHIAZIDE 25 MG T: 25 | 30 days supply | Qty: 30 | Fill #2

## 2015-10-30 ENCOUNTER — Encounter: Payer: Self-pay | Admitting: Pharmacist

## 2015-10-30 ENCOUNTER — Other Ambulatory Visit: Payer: Self-pay

## 2015-11-06 ENCOUNTER — Ambulatory Visit (HOSPITAL_BASED_OUTPATIENT_CLINIC_OR_DEPARTMENT_OTHER): Payer: Self-pay | Admitting: Pharmacist

## 2015-11-06 ENCOUNTER — Ambulatory Visit: Payer: Self-pay | Attending: Internal Medicine | Admitting: Clinical

## 2015-11-06 DIAGNOSIS — Z794 Long term (current) use of insulin: Secondary | ICD-10-CM | POA: Insufficient documentation

## 2015-11-06 DIAGNOSIS — R351 Nocturia: Secondary | ICD-10-CM | POA: Insufficient documentation

## 2015-11-06 DIAGNOSIS — E1165 Type 2 diabetes mellitus with hyperglycemia: Secondary | ICD-10-CM | POA: Insufficient documentation

## 2015-11-06 DIAGNOSIS — F32A Depression, unspecified: Secondary | ICD-10-CM

## 2015-11-06 DIAGNOSIS — E1140 Type 2 diabetes mellitus with diabetic neuropathy, unspecified: Secondary | ICD-10-CM | POA: Insufficient documentation

## 2015-11-06 DIAGNOSIS — F329 Major depressive disorder, single episode, unspecified: Secondary | ICD-10-CM

## 2015-11-06 LAB — POCT URINALYSIS DIPSTICK
BILIRUBIN UA: NEGATIVE
Glucose, UA: 500
KETONES UA: NEGATIVE
LEUKOCYTES UA: NEGATIVE
Nitrite, UA: NEGATIVE
PH UA: 5.5
PROTEIN UA: 30
RBC UA: NEGATIVE
SPEC GRAV UA: 1.015
Urobilinogen, UA: 0.2

## 2015-11-06 LAB — GLUCOSE, POCT (MANUAL RESULT ENTRY)
POC GLUCOSE: 358 mg/dL — AB (ref 70–99)
POC Glucose: 291 mg/dl — AB (ref 70–99)

## 2015-11-06 MED ORDER — INSULIN ASPART 100 UNIT/ML ~~LOC~~ SOLN
20.0000 [IU] | Freq: Once | SUBCUTANEOUS | Status: AC
  Administered 2015-11-06: 20 [IU] via SUBCUTANEOUS

## 2015-11-06 NOTE — Patient Instructions (Addendum)
PLAN:  1. F/U with behavioral health consultant as needed 2. Psychiatric medications: Lexapro, continue to take only as needed 3. Behavioral recommendation(s):  -Continue to spend quality time with grandchildren -Continue daily relaxation breathing exercises

## 2015-11-06 NOTE — Progress Notes (Signed)
ASSESSMENT: Pt currently experiencing symptoms of depression, needs to f/u with PCP, and would benefit from continued supportive counseling regarding coping with symptoms of depression.  Stage of Change: determination  PLAN: 1. F/U with behavioral health consultant in as needed 2. Psychiatric Medications: Lexapro, continue to take only as needed 3. Behavioral recommendation(s):   -Continue to spend quality time with grandchildren -Continue daily relaxation breathing exercises  SUBJECTIVE: Pt. referred by Dr Doreene Burke for symptoms of anxiety and depression:  Pt. reports the following symptoms/concerns: Pt states that she just wants to check in today, to let [facility] know she is feeling a little better, that she sometimes feels stressed, but that it feels manageable now; spending time with grandchildren and daily relaxation breathing exercises are helping with both anxiety and depression. Pt states her primary concern is her high blood pressure, and she will find out if her meds need changing for blood pressure at todays appt. Duration of problem: decrease in at least one month Severity: moderate  OBJECTIVE: Orientation & Cognition: Oriented x3. Thought processes normal and appropriate to situation. Mood: appropriate. Affect: appropriate Appearance: appropriate Risk of harm to self or others: no known risk of harm to self or others Substance use: none Assessments administered: PHQ9: 15/ GAD7: 15  Diagnosis: Depression CPT Code: F32.9 -------------------------------------------- Other(s) present in the room: none  Time spent with patient in exam room: 20 minutes

## 2015-11-06 NOTE — Patient Instructions (Signed)
Thank you for coming to see me!  Please use the Novolog with as many meals as you can - this is really going to help get your blood sugar down  Come back and see me in 2 weeks for log review - bring your log book or meter   Basic Carbohydrate Counting for Diabetes Mellitus Carbohydrate counting is a method for keeping track of the amount of carbohydrates you eat. Eating carbohydrates naturally increases the level of sugar (glucose) in your blood, so it is important for you to know the amount that is okay for you to have in every meal. Carbohydrate counting helps keep the level of glucose in your blood within normal limits. The amount of carbohydrates allowed is different for every person. A dietitian can help you calculate the amount that is right for you. Once you know the amount of carbohydrates you can have, you can count the carbohydrates in the foods you want to eat. Carbohydrates are found in the following foods:  Grains, such as breads and cereals.  Dried beans and soy products.  Starchy vegetables, such as potatoes, peas, and corn.  Fruit and fruit juices.  Milk and yogurt.  Sweets and snack foods, such as cake, cookies, candy, chips, soft drinks, and fruit drinks. CARBOHYDRATE COUNTING There are two ways to count the carbohydrates in your food. You can use either of the methods or a combination of both. Reading the "Nutrition Facts" on [place] The "Nutrition Facts" is an area that is included on the labels of almost all packaged food and beverages in the [place]. It includes the serving size of that food or beverage and information about the nutrients in each serving of the food, including the grams (g) of carbohydrate per serving.  Decide the number of servings of this food or beverage that you will be able to eat or drink. Multiply that number of servings by the number of grams of carbohydrate that is listed on the label for that serving. The total will be the amount  of carbohydrates you will be having when you eat or drink this food or beverage. Learning Standard Serving Sizes of Food When you eat food that is not packaged or does not include "Nutrition Facts" on the label, you need to measure the servings in order to count the amount of carbohydrates.A serving of most carbohydrate-rich foods contains about 15 g of carbohydrates. The following list includes serving sizes of carbohydrate-rich foods that provide 15 g ofcarbohydrate per serving:   1 slice of bread (1 oz) or 1 six-inch tortilla.    of a hamburger bun or English muffin.  4-6 crackers.   cup unsweetened dry cereal.    cup hot cereal.   cup rice or pasta.    cup mashed potatoes or  of a large baked potato.  1 cup fresh fruit or one small piece of fruit.    cup canned or frozen fruit or fruit juice.  1 cup milk.   cup plain fat-free yogurt or yogurt sweetened with artificial sweeteners.   cup cooked dried beans or starchy vegetable, such as peas, corn, or potatoes.  Decide the number of standard-size servings that you will eat. Multiply that number of servings by 15 (the grams of carbohydrates in that serving). For example, if you eat 2 cups of strawberries, you will have eaten 2 servings and 30 g of carbohydrates (2 servings x 15 g = 30 g). For foods such as soups and casseroles, in which  more than one food is mixed in, you will need to count the carbohydrates in each food that is included. EXAMPLE OF CARBOHYDRATE COUNTING Sample Dinner  3 oz chicken breast.   cup of brown rice.   cup of corn.  1 cup milk.   1 cup strawberries with sugar-free whipped topping.  Carbohydrate Calculation Step 1: Identify the foods that contain carbohydrates:   Rice.   Corn.   Milk.   Strawberries. Step 2:Calculate the number of servings eaten of each:   2 servings of rice.   1 serving of corn.   1 serving of milk.   1 serving of strawberries. Step 3:  Multiply each of those number of servings by 15 g:   2 servings of rice x 15 g = 30 g.   1 serving of corn x 15 g = 15 g.   1 serving of milk x 15 g = 15 g.   1 serving of strawberries x 15 g = 15 g. Step 4: Add together all of the amounts to find the total grams of carbohydrates eaten: 30 g + 15 g + 15 g + 15 g = 75 g.   This information is not intended to replace advice given to you by your health care provider. Make sure you discuss any questions you have with your health care provider.   Document Released: 09/28/2005 Document Revised: 10/19/2014 Document Reviewed: 08/25/2013 Elsevier Interactive Patient Education [employer].

## 2015-11-06 NOTE — Progress Notes (Signed)
S:    Patient arrives in good spirits.  Presents for diabetes follow up.   Patient reports adherence with medications. Current diabetes medications include Lantus 50 units daily, Novolog sliding scale and glipizide 10 mg BID. However, she still doesn't take the Novolog except maybe once over the last few weeks.  Patient was on metformin and Farxiga in the past but they were stopped due to renal function. She was also on Januvia but it was stopped and switched to rapid acting insulin.   Patient denies hypoglycemic events.  Patient reported dietary habits: no changes  Patient reported exercise habits: none   Patient reports nocturia. Patient reports neuropathy on the top of her feet. Patient denies visual changes. Patient reports self foot exams.     O:  Lab Results  Component Value Date   HGBA1C >15.0 08/15/2015    Home fasting CBG: 200s - 340 2 hour post-prandial/random CBG: 200s-300s  POCT glucose = 358; 291  A/P: Diabetes currently uncontrolled based on A1c of >15.0 and home blood glucose readings.   Patient denies hypoglycemic events and is able to verbalize appropriate hypoglycemia management plan.  Patient reports adherence with medication. Control is suboptimal due to sedentary lifestyle and dietary indiscretion. She is also noncompliant with the Novolog because she hates giving herself injections.  Administered 20 units of insulin aspart in clinic. Obtained urine ketones, which were negative.   Continued basal insulin Lantus (insulin glargine) to 50 units daily. Will not change until patient is compliant with Novolog.   Continued rapid insulin Novolog (insulin aspart) sliding scale and stressed the importance of using it with every meal. Also reviewed how it works and why it will really help to bring her blood glucose levels down overall. Patient is willing to try to take it with more meals in order to get her blood glucose under control.   Will also continue glipizide  10 mg BID.   Also reviewed dietary lifestyle changes including the plate method, nutrition labels, and carb counting. Patient provided with written materials to review at home and encouraged to ask any questions she has either by calling the clinic or asking at future visits.   Next A1C anticipated February 2017.    Medication reconciliation completed. Written patient instructions provided.  Total time in face to face counseling 35 minutes.  Follow up in Pharmacist Clinic Visit in 2 weeks.

## 2015-11-08 MED FILL — ?GLIPIZIDE 10 MG TABLET: 10 | 30 days supply | Qty: 60 | Fill #2

## 2015-11-08 MED FILL — ATORVASTATIN 10 MG TABLET: 10 | 30 days supply | Qty: 30 | Fill #2

## 2015-11-18 ENCOUNTER — Other Ambulatory Visit: Payer: Self-pay | Admitting: Internal Medicine

## 2015-11-18 MED ORDER — INSULIN ASPART 100 UNIT/ML ~~LOC~~ SOLN: SUBCUTANEOUS | Status: DC

## 2015-11-18 MED FILL — HYDROCHLOROTHIAZIDE 25 MG T: 25 | 30 days supply | Qty: 30 | Fill #3

## 2015-11-18 MED FILL — !NOVOLOG 100UNITS/ML VIAL: 100/ML | 28 days supply | Qty: 10 | Fill #1

## 2015-11-20 ENCOUNTER — Ambulatory Visit: Payer: Self-pay | Attending: Internal Medicine | Admitting: Pharmacist

## 2015-11-20 DIAGNOSIS — Z794 Long term (current) use of insulin: Secondary | ICD-10-CM | POA: Insufficient documentation

## 2015-11-20 DIAGNOSIS — Z9114 Patient's other noncompliance with medication regimen: Secondary | ICD-10-CM | POA: Insufficient documentation

## 2015-11-20 DIAGNOSIS — E1165 Type 2 diabetes mellitus with hyperglycemia: Secondary | ICD-10-CM

## 2015-11-20 DIAGNOSIS — E119 Type 2 diabetes mellitus without complications: Secondary | ICD-10-CM | POA: Insufficient documentation

## 2015-11-20 LAB — POCT GLYCOSYLATED HEMOGLOBIN (HGB A1C): HEMOGLOBIN A1C: 14.5

## 2015-11-20 LAB — GLUCOSE, POCT (MANUAL RESULT ENTRY): POC Glucose: 226 mg/dl — AB (ref 70–99)

## 2015-11-20 NOTE — Progress Notes (Signed)
S:    Patricia [name] arrives in good spirits.  Presents for diabetes follow up.   Patricia [name] reports adherence with medications. Current diabetes medications include Lantus 50 units daily, Novolog sliding scale when she feels like she needs it and glipizide 10 mg BID.   Patricia [name] denies hypoglycemic events.  Patricia [name] reported dietary habits: no changes. She likes to eat a lot of green beans but knows that she still does eat a decent amount of carbs - it is very hard for her to control her carb intake based on her job and taking care of grand kids.  Patricia [name] reported exercise habits: none   Patricia [name] reports nocturia. Patricia [name] reports neuropathy on the top of her feet. Patricia [name] denies visual changes. Patricia [name] reports self foot exams.     O:  Lab Results  Component Value Date   HGBA1C 14.50 11/20/2015    Home fasting CBG: 170s 2 hour post-prandial/random CBG: 200s-300s  POCT glucose = 226  A/P: Diabetes currently uncontrolled based on A1c of 14.5 and home blood glucose readings.   Patricia [name] denies hypoglycemic events and is able to verbalize appropriate hypoglycemia management plan.  Patricia [name] denies adherence with medication. Control is suboptimal due to sedentary lifestyle and dietary indiscretion. She is also noncompliant with the Novolog because she hates giving herself injections but she has been giving them more frequently.  I am not sure that she is actually checking her blood sugars as frequently as she reports. Her fastings are consistently 171 or 177 and her post-prandials are also very similar. It is highly unusual for the numbers to be the same consistently. She does not have her meter with her so I am unable to confirm that these numbers are accurate. However, I did believe she checks them enough to show that she is still uncontrolled and needs a dose increase in her insulin.   Increased basal insulin Lantus (insulin glargine) to 55 units daily. Plan to get fastings to <120  Continued rapid  insulin Novolog (insulin aspart) sliding scale and stressed the importance of using it with every meal. Will also continue glipizide 10 mg BID.   Next A1C anticipated May 2017.    Medication reconciliation completed. Written Patricia [name] instructions provided.  Total time in face to face counseling 20 minutes.  Follow up with Dr. Doreene Burke next for 3 month PCP follow up unless she is unable to get in and then she can follow up with me in 2 weeks.

## 2015-11-20 NOTE — Patient Instructions (Signed)
Increase Lantus to 55 units daily  Continue your other medications  Next visit with Dr. Doreene Burke for follow up - please schedule that appointment now

## 2015-11-26 MED FILL — $LANTUS SOLOSTAR 100 UNITS/: 100 | 30 days supply | Qty: 15 | Fill #1

## 2015-11-27 ENCOUNTER — Ambulatory Visit: Payer: Self-pay

## 2015-12-09 MED FILL — glipiZIDE 10 MG TABS: 10 | 30 days supply | Qty: 60 | Fill #3

## 2015-12-12 ENCOUNTER — Encounter: Payer: Self-pay | Admitting: Internal Medicine

## 2015-12-12 ENCOUNTER — Ambulatory Visit: Payer: Self-pay | Attending: Internal Medicine | Admitting: Internal Medicine

## 2015-12-12 VITALS — BP 157/82 | HR 85 | Temp 97.6°F | Resp 18 | Ht 63.0 in | Wt 175.0 lb

## 2015-12-12 DIAGNOSIS — Z794 Long term (current) use of insulin: Secondary | ICD-10-CM

## 2015-12-12 DIAGNOSIS — Z7982 Long term (current) use of aspirin: Secondary | ICD-10-CM | POA: Insufficient documentation

## 2015-12-12 DIAGNOSIS — E1165 Type 2 diabetes mellitus with hyperglycemia: Secondary | ICD-10-CM

## 2015-12-12 DIAGNOSIS — E785 Hyperlipidemia, unspecified: Secondary | ICD-10-CM

## 2015-12-12 DIAGNOSIS — E669 Obesity, unspecified: Secondary | ICD-10-CM | POA: Insufficient documentation

## 2015-12-12 DIAGNOSIS — Z888 Allergy status to other drugs, medicaments and biological substances status: Secondary | ICD-10-CM | POA: Insufficient documentation

## 2015-12-12 DIAGNOSIS — F418 Other specified anxiety disorders: Secondary | ICD-10-CM

## 2015-12-12 DIAGNOSIS — F329 Major depressive disorder, single episode, unspecified: Secondary | ICD-10-CM | POA: Insufficient documentation

## 2015-12-12 DIAGNOSIS — Z79899 Other long term (current) drug therapy: Secondary | ICD-10-CM | POA: Insufficient documentation

## 2015-12-12 DIAGNOSIS — F419 Anxiety disorder, unspecified: Secondary | ICD-10-CM | POA: Insufficient documentation

## 2015-12-12 DIAGNOSIS — I10 Essential (primary) hypertension: Secondary | ICD-10-CM

## 2015-12-12 LAB — GLUCOSE, POCT (MANUAL RESULT ENTRY): POC Glucose: 484 mg/dl — AB (ref 70–99)

## 2015-12-12 MED ORDER — FENOFIBRATE 145 MG PO TABS: 145.0000 mg | ORAL_TABLET | Freq: Every day | ORAL | Status: DC

## 2015-12-12 MED ORDER — INSULIN ASPART 100 UNIT/ML ~~LOC~~ SOLN
20.0000 [IU] | Freq: Once | SUBCUTANEOUS | Status: AC
  Administered 2015-12-12: 20 [IU] via SUBCUTANEOUS

## 2015-12-12 MED ORDER — GLIPIZIDE 10 MG PO TABS: 10.0000 mg | ORAL_TABLET | Freq: Two times a day (BID) | ORAL | Status: DC

## 2015-12-12 MED ORDER — ESCITALOPRAM OXALATE 10 MG PO TABS
10.0000 mg | ORAL_TABLET | Freq: Every evening | ORAL | Status: DC | PRN
Start: 2015-12-12 — End: 2016-09-29

## 2015-12-12 MED ORDER — HYDROCHLOROTHIAZIDE 25 MG PO TABS: 25.0000 mg | ORAL_TABLET | Freq: Every day | ORAL | Status: DC

## 2015-12-12 MED ORDER — ATORVASTATIN CALCIUM 10 MG PO TABS: 10.0000 mg | ORAL_TABLET | Freq: Every day | ORAL | Status: DC

## 2015-12-12 MED ORDER — INSULIN GLARGINE 100 UNIT/ML SOLOSTAR PEN: 70.0000 [IU] | PEN_INJECTOR | Freq: Every day | SUBCUTANEOUS | Status: DC

## 2015-12-12 MED FILL — HYDROCHLOROTHIAZIDE 25 MG T: 25 | 30 days supply | Qty: 30 | Fill #0

## 2015-12-12 MED FILL — FENOFIBRATE 145 MG TABLET: 145 | 30 days supply | Qty: 30 | Fill #0

## 2015-12-12 MED FILL — ?ESCITALOPRAM 10 MG TABLET: 10 | 30 days supply | Qty: 30 | Fill #0

## 2015-12-12 MED FILL — ATORVASTATIN 10 MG TABLET: 10 | 30 days supply | Qty: 30 | Fill #0

## 2015-12-12 NOTE — Progress Notes (Signed)
Patient ID: Patricia [name], female   DOB: [birth year], 60 y.o.   MRN: [id number]   Izadora Roehr, is a 60 y.o. female  YEB:[id number]  OHF:[id number]  DOB - January 14, 1956  Chief Complaint  Patient presents with  . Follow-up    DM        Subjective:   Patricia [name] is a 60 y.o. female with history of type 2 diabetes mellitus, hypertension and obesity here today for a follow up visit and medication management. Patient has not been doing well with her blood sugar control, recent hemoglobin A1c was 14.5%, she is currently working with a Nature conservation officer on techniques of managing her blood sugar. Patient has some significant social stressors at home and this is negatively impacting her health and medical management of her conditions. However she is ready to do much better and hence forth. She has no significant complaints today. Patient also follows up with Clinical social worker for management of her anxiety and depression. Patient has No headache, No chest pain, No abdominal pain - No Nausea, No new weakness tingling or numbness, No Cough - SOB.  Problem  Anxiety and Depression    ALLERGIES: Allergies  Allergen Reactions  . Sulfa Antibiotics Itching and Rash    PAST MEDICAL HISTORY: Past Medical History  Diagnosis Date  . Diabetes mellitus without complication ([place])   . Hypertension   . Obesity     MEDICATIONS AT HOME: Prior to Admission medications   Medication Sig Start Date End Date Taking? Authorizing Provider  aspirin EC 81 MG tablet Take 81 mg by mouth daily.   Yes Historical Provider, MD  atorvastatin (LIPITOR) 10 MG tablet Take 1 tablet (10 mg total) by mouth daily. 12/12/15  Yes Tresa Garter, MD  Blood Glucose Monitoring Suppl (TRUE METRIX METER) W/DEVICE KIT Use as directed 09/04/15  Yes Demoni Parmar E Doreene Burke, MD  escitalopram (LEXAPRO) 10 MG tablet Take 1 tablet (10 mg total) by mouth at bedtime as needed (anxiety/ insomnia). 12/12/15  Yes Tresa Garter,  MD  fenofibrate (TRICOR) 145 MG tablet Take 1 tablet (145 mg total) by mouth daily. 12/12/15  Yes Tresa Garter, MD  glipiZIDE (GLUCOTROL) 10 MG tablet Take 1 tablet (10 mg total) by mouth 2 (two) times daily before a meal. 12/12/15  Yes Dimples Probus E Doreene Burke, MD  glucose blood (TRUE METRIX BLOOD GLUCOSE TEST) test strip Use as instructed 09/04/15  Yes Abygayle Deltoro E Doreene Burke, MD  hydrochlorothiazide (HYDRODIURIL) 25 MG tablet Take 1 tablet (25 mg total) by mouth daily. 12/12/15  Yes Tresa Garter, MD  insulin aspart (NOVOLOG) 100 UNIT/ML injection Sliding scale: Inject 3 units for blood sugar 150-200 Inject 5 units for blood sugar 201-250 Inject 7 units for blood sugar 251-300 Inject 10 units for blood sugar > 350 and call clinic 11/18/15  Yes Tresa Garter, MD  Insulin Glargine (LANTUS SOLOSTAR) 100 UNIT/ML Solostar Pen Inject 70 Units into the skin daily at 10 pm. 12/12/15  Yes Mcclain Shall E Doreene Burke, MD  Insulin Pen Needle 29G X 12.7MM MISC 10 Units by Does not apply route 1 day or 1 dose. 01/28/15  Yes Lance Bosch, NP  Insulin Syringe-Needle U-100 30G X 5/16" 1 ML MISC Test 3 times daily 05/29/15  Yes Tresa Garter, MD  TRUEPLUS LANCETS 28G MISC Used as directed 09/04/15  Yes Tresa Garter, MD    Objective:   Filed Vitals:   12/12/15 1242  BP: 157/82  Pulse: 85  Temp: 97.6  F (36.4 C)  TempSrc: Oral  Resp: 18  Height: _0  (1.6 m)  Weight: 175 lb (79.379 kg)  SpO2: 100%   Exam General appearance : Awake, alert, not in any distress. Speech Clear. Not toxic looking HEENT: Atraumatic and Normocephalic, pupils equally reactive to light and accomodation Neck: supple, no JVD. No cervical lymphadenopathy.  Chest:Good air entry bilaterally, no added sounds  CVS: S1 S2 regular, no murmurs.  Abdomen: Bowel sounds present, Non tender and not distended with no gaurding, rigidity or rebound. Extremities: B/L Lower Ext shows no edema, both legs are warm to touch Neurology:  Awake alert, and oriented X 3, CN II-XII intact, Non focal Skin:No Rash  Data Review Lab Results  Component Value Date   HGBA1C 14.50 11/20/2015   HGBA1C >15.0 08/15/2015   HGBA1C >15.0 01/28/2015     Assessment & Plan   1. Type 2 diabetes mellitus with hyperglycemia, with long-term current use of insulin (HCC)  - insulin aspart (novoLOG) injection 20 Units; Inject 0.2 mLs (20 Units total) into the skin once. - Glucose (CBG)  Increase Lantus Insulin to 70 units from 50 units - Insulin Glargine (LANTUS SOLOSTAR) 100 UNIT/ML Solostar Pen; Inject 70 Units into the skin daily at 10 pm.  Dispense: 15 mL; Refill: 3 - glipiZIDE (GLUCOTROL) 10 MG tablet; Take 1 tablet (10 mg total) by mouth 2 (two) times daily before a meal.  Dispense: 180 tablet; Refill: 3  2. Essential hypertension  - hydrochlorothiazide (HYDRODIURIL) 25 MG tablet; Take 1 tablet (25 mg total) by mouth daily.  Dispense: 90 tablet; Refill: 3  We have discussed target BP range and blood pressure goal. I have advised patient to check BP regularly and to call us back or report to clinic if the numbers are consistently higher than 140/90. We discussed the importance of compliance with medical therapy and DASH diet recommended, consequences of uncontrolled hypertension discussed.   - continue current BP medications  3. Dyslipidemia  - fenofibrate (TRICOR) 145 MG tablet; Take 1 tablet (145 mg total) by mouth daily.  Dispense: 30 tablet; Refill: 3 - atorvastatin (LIPITOR) 10 MG tablet; Take 1 tablet (10 mg total) by mouth daily.  Dispense: 90 tablet; Refill: 3  To address this please limit saturated fat to no more than 7% of your calories, limit cholesterol to 200 mg/day, increase fiber and exercise as tolerated. If needed we may add another cholesterol lowering medication to your regimen.   4. Anxiety and depression  - escitalopram (LEXAPRO) 10 MG tablet; Take 1 tablet (10 mg total) by mouth at bedtime as needed (anxiety/  insomnia).  Dispense: 30 tablet; Refill: 3  Patient have been counseled extensively about nutrition and exercise  Return in about 3 months (around 03/13/2016) for Hemoglobin A1C and Follow up, DM, Follow up HTN, Follow up Pain and comorbidities.  The patient was given clear instructions to go to ER or return to medical center if symptoms don't improve, worsen or new problems develop. The patient verbalized understanding. The patient was told to call to get lab results if they haven't heard anything in the next week.   This note has been created with Surveyor, quantity. Any transcriptional errors are unintentional.    Angelica Chessman, MD, [place], [place], [place], [place] and [place], [place]   12/12/2015, 1:00 PM

## 2015-12-12 NOTE — Patient Instructions (Signed)
Diabetes and Exercise Exercising regularly is important. It is not just about losing weight. It has many health benefits, such as:  Improving your overall fitness, flexibility, and endurance.  Increasing your bone density.  Helping with weight control.  Decreasing your body fat.  Increasing your muscle strength.  Reducing stress and tension.  Improving your overall health. People with diabetes who exercise gain additional benefits because exercise:  Reduces appetite.  Improves the body's use of blood sugar (glucose).  Helps lower or control blood glucose.  Decreases blood pressure.  Helps control blood lipids (such as cholesterol and triglycerides).  Improves the body's use of the hormone insulin by:  Increasing the body's insulin sensitivity.  Reducing the body's insulin needs.  Decreases the risk for heart disease because exercising:  Lowers cholesterol and triglycerides levels.  Increases the levels of good cholesterol (such as high-density lipoproteins [HDL]) in the body.  Lowers blood glucose levels. YOUR ACTIVITY PLAN  Choose an activity that you enjoy, and set realistic goals. To exercise safely, you should begin practicing any new physical activity slowly, and gradually increase the intensity of the exercise over time. Your health care provider or diabetes educator can help create an activity plan that works for you. General recommendations include:  Encouraging children to engage in at least 60 minutes of physical activity each day.  Stretching and performing strength training exercises, such as yoga or weight lifting, at least 2 times per week.  Performing a total of at least 150 minutes of moderate-intensity exercise each week, such as brisk walking or water aerobics.  Exercising at least 3 days per week, making sure you allow no more than 2 consecutive days to pass without exercising.  Avoiding long periods of inactivity (90 minutes or more). When you  have to spend an extended period of time sitting down, take frequent breaks to walk or stretch. RECOMMENDATIONS FOR EXERCISING WITH TYPE 1 OR TYPE 2 DIABETES   Check your blood glucose before exercising. If blood glucose levels are greater than 240 mg/dL, check for urine ketones. Do not exercise if ketones are present.  Avoid injecting insulin into areas of the body that are going to be exercised. For example, avoid injecting insulin into:  The arms when playing tennis.  The legs when jogging.  Keep a record of:  Food intake before and after you exercise.  Expected peak times of insulin action.  Blood glucose levels before and after you exercise.  The type and amount of exercise you have done.  Review your records with your health care provider. Your health care provider will help you to develop guidelines for adjusting food intake and insulin amounts before and after exercising.  If you take insulin or oral hypoglycemic agents, watch for signs and symptoms of hypoglycemia. They include:  Dizziness.  Shaking.  Sweating.  Chills.  Confusion.  Drink plenty of water while you exercise to prevent dehydration or heat stroke. Body water is lost during exercise and must be replaced.  Talk to your health care provider before starting an exercise program to make sure it is safe for you. Remember, almost any type of activity is better than none.   This information is not intended to replace advice given to you by your health care provider. Make sure you discuss any questions you have with your health care provider.   Document Released: 12/19/2003 Document Revised: 02/12/2015 Document Reviewed: 03/07/2013 Elsevier Interactive Patient Education 2016 [place]. Diabetes and Foot Care Diabetes may cause you  to have problems because of poor blood supply (circulation) to your feet and legs. This may cause the skin on your feet to become thinner, break easier, and heal more slowly.  Your skin may become dry, and the skin may peel and crack. You may also have nerve damage in your legs and feet causing decreased feeling in them. You may not notice minor injuries to your feet that could lead to infections or more serious problems. Taking care of your feet is one of the most important things you can do for yourself.  HOME CARE INSTRUCTIONS  Wear shoes at all times, even in the house. Do not go barefoot. Bare feet are easily injured.  Check your feet daily for blisters, cuts, and redness. If you cannot see the bottom of your feet, use a mirror or ask someone for help.  Wash your feet with warm water (do not use hot water) and mild soap. Then pat your feet and the areas between your toes until they are completely dry. Do not soak your feet as this can dry your skin.  Apply a moisturizing lotion or petroleum jelly (that does not contain alcohol and is unscented) to the skin on your feet and to dry, brittle toenails. Do not apply lotion between your toes.  Trim your toenails straight across. Do not dig under them or around the cuticle. File the edges of your nails with an emery board or nail file.  Do not cut corns or calluses or try to remove them with medicine.  Wear clean socks or stockings every day. Make sure they are not too tight. Do not wear knee-high stockings since they may decrease blood flow to your legs.  Wear shoes that fit properly and have enough cushioning. To break in new shoes, wear them for just a few hours a day. This prevents you from injuring your feet. Always look in your shoes before you put them on to be sure there are no objects inside.  Do not cross your legs. This may decrease the blood flow to your feet.  If you find a minor scrape, cut, or break in the skin on your feet, keep it and the skin around it clean and dry. These areas may be cleansed with mild soap and water. Do not cleanse the area with peroxide, alcohol, or iodine.  When you remove an  adhesive bandage, be sure not to damage the skin around it.  If you have a wound, look at it several times a day to make sure it is healing.  Do not use heating pads or hot water bottles. They may burn your skin. If you have lost feeling in your feet or legs, you may not know it is happening until it is too late.  Make sure your health care provider performs a complete foot exam at least annually or more often if you have foot problems. Report any cuts, sores, or bruises to your health care provider immediately. SEEK MEDICAL CARE IF:   You have an injury that is not healing.  You have cuts or breaks in the skin.  You have an ingrown nail.  You notice redness on your legs or feet.  You feel burning or tingling in your legs or feet.  You have pain or cramps in your legs and feet.  Your legs or feet are numb.  Your feet always feel cold. SEEK IMMEDIATE MEDICAL CARE IF:   There is increasing redness, swelling, or pain in or around a   wound.  There is a red line that goes up your leg.  Pus is coming from a wound.  You develop a fever or as directed by your health care provider.  You notice a bad smell coming from an ulcer or wound.   This information is not intended to replace advice given to you by your health care provider. Make sure you discuss any questions you have with your health care provider.   Document Released: 09/25/2000 Document Revised: 05/31/2013 Document Reviewed: 03/07/2013 Elsevier Interactive Patient Education 2016 [place] Carbohydrate Counting for Diabetes Mellitus Carbohydrate counting is a method for keeping track of the amount of carbohydrates you eat. Eating carbohydrates naturally increases the level of sugar (glucose) in your blood, so it is important for you to know the amount that is okay for you to have in every meal. Carbohydrate counting helps keep the level of glucose in your blood within normal limits. The amount of carbohydrates  allowed is different for every person. A dietitian can help you calculate the amount that is right for you. Once you know the amount of carbohydrates you can have, you can count the carbohydrates in the foods you want to eat. Carbohydrates are found in the following foods:  Grains, such as breads and cereals.  Dried beans and soy products.  Starchy vegetables, such as potatoes, peas, and corn.  Fruit and fruit juices.  Milk and yogurt.  Sweets and snack foods, such as cake, cookies, candy, chips, soft drinks, and fruit drinks. CARBOHYDRATE COUNTING There are two ways to count the carbohydrates in your food. You can use either of the methods or a combination of both. Reading the "Nutrition Facts" on [place] The "Nutrition Facts" is an area that is included on the labels of almost all packaged food and beverages in the [place]. It includes the serving size of that food or beverage and information about the nutrients in each serving of the food, including the grams (g) of carbohydrate per serving.  Decide the number of servings of this food or beverage that you will be able to eat or drink. Multiply that number of servings by the number of grams of carbohydrate that is listed on the label for that serving. The total will be the amount of carbohydrates you will be having when you eat or drink this food or beverage. Learning Standard Serving Sizes of Food When you eat food that is not packaged or does not include "Nutrition Facts" on the label, you need to measure the servings in order to count the amount of carbohydrates.A serving of most carbohydrate-rich foods contains about 15 g of carbohydrates. The following list includes serving sizes of carbohydrate-rich foods that provide 15 g ofcarbohydrate per serving:   1 slice of bread (1 oz) or 1 six-inch tortilla.    of a hamburger bun or English muffin.  4-6 crackers.   cup unsweetened dry cereal.    cup hot cereal.    cup rice or pasta.    cup mashed potatoes or  of a large baked potato.  1 cup fresh fruit or one small piece of fruit.    cup canned or frozen fruit or fruit juice.  1 cup milk.   cup plain fat-free yogurt or yogurt sweetened with artificial sweeteners.   cup cooked dried beans or starchy vegetable, such as peas, corn, or potatoes.  Decide the number of standard-size servings that you will eat. Multiply that number of servings by 15 (the grams  of carbohydrates in that serving). For example, if you eat 2 cups of strawberries, you will have eaten 2 servings and 30 g of carbohydrates (2 servings x 15 g = 30 g). For foods such as soups and casseroles, in which more than one food is mixed in, you will need to count the carbohydrates in each food that is included. EXAMPLE OF CARBOHYDRATE COUNTING Sample Dinner  3 oz chicken breast.   cup of brown rice.   cup of corn.  1 cup milk.   1 cup strawberries with sugar-free whipped topping.  Carbohydrate Calculation Step 1: Identify the foods that contain carbohydrates:   Rice.   Corn.   Milk.   Strawberries. Step 2:Calculate the number of servings eaten of each:   2 servings of rice.   1 serving of corn.   1 serving of milk.   1 serving of strawberries. Step 3: Multiply each of those number of servings by 15 g:   2 servings of rice x 15 g = 30 g.   1 serving of corn x 15 g = 15 g.   1 serving of milk x 15 g = 15 g.   1 serving of strawberries x 15 g = 15 g. Step 4: Add together all of the amounts to find the total grams of carbohydrates eaten: 30 g + 15 g + 15 g + 15 g = 75 g.   This information is not intended to replace advice given to you by your health care provider. Make sure you discuss any questions you have with your health care provider.   Document Released: 09/28/2005 Document Revised: 10/19/2014 Document Reviewed: 08/25/2013 Elsevier Interactive Patient Education [employer].

## 2015-12-12 NOTE — Progress Notes (Signed)
Patient is here for FU DM  Patient denies pain at this time.  Patient received 20 units of novolog in the office.

## 2015-12-17 MED FILL — HYDROCHLOROTHIAZIDE 25 MG T: 25 | 30 days supply | Qty: 30 | Fill #4

## 2015-12-30 MED FILL — $LANTUS SOLOSTAR 100 UNITS/: 100 | 5 days supply | Qty: 3 | Fill #2

## 2016-01-07 MED FILL — glipiZIDE 10 MG TABS: 10 | 30 days supply | Qty: 60 | Fill #4

## 2016-01-13 MED FILL — !LANTUS SOLOSTAR 100UNITS/M: 100 | 24 days supply | Qty: 15 | Fill #3

## 2016-01-21 ENCOUNTER — Telehealth: Payer: Self-pay | Admitting: Clinical

## 2016-01-21 NOTE — Telephone Encounter (Signed)
Attempt to terminate with patient, and let her know she may schedule, as needed, with [facility] Trivia Heffelfinger from [place], prior to end of April, but no voicemail set up, no message left.

## 2016-01-28 MED FILL — ?HYDROCHLOROTHIAZIDE 25 MG: 25 MG | 30 days supply | Qty: 30 | Fill #5

## 2016-01-28 MED FILL — ?ATORVASTATIN 10 MG TABLET: 10 | 30 days supply | Qty: 30 | Fill #1

## 2016-02-03 ENCOUNTER — Ambulatory Visit: Payer: Self-pay | Attending: Internal Medicine

## 2016-02-17 MED FILL — $LANTUS SOLOSTAR 100 UNITS/: 100 | 30 days supply | Qty: 12 | Fill #4

## 2016-02-20 ENCOUNTER — Telehealth: Payer: Self-pay | Admitting: Internal Medicine

## 2016-02-20 NOTE — Telephone Encounter (Signed)
Mailbox is full and I am unable to leave patient a message.  Dr. Doreene Burke ordered a year's supply in March 2017 and therefore the patient does not need refills until next year. She needs to contact the pharmacy to refill her medication.

## 2016-02-20 NOTE — Telephone Encounter (Signed)
Patient called requesting a medication refill for glipizide and cholesterol meds. Please follow up.

## 2016-03-02 MED FILL — ?ATORVASTATIN 10 MG TABLET: 10 | 30 days supply | Qty: 30 | Fill #2

## 2016-03-02 MED FILL — glipiZIDE 10 MG TABS: 10 | 30 days supply | Qty: 60 | Fill #0

## 2016-03-02 MED FILL — ?HYDROCHLOROTHIAZIDE 25 MG: 25 MG | 30 days supply | Qty: 30 | Fill #6

## 2016-03-05 MED FILL — glipiZIDE 10 MG TABS: 10 | 30 days supply | Qty: 60 | Fill #5

## 2016-03-06 ENCOUNTER — Encounter (HOSPITAL_COMMUNITY): Payer: Self-pay

## 2016-03-06 ENCOUNTER — Emergency Department (HOSPITAL_COMMUNITY)
Admission: EM | Admit: 2016-03-06 | Discharge: 2016-03-06 | Disposition: A | Discharge status: Home / Self Care | Payer: Self-pay | Attending: Emergency Medicine | Admitting: Emergency Medicine

## 2016-03-06 DIAGNOSIS — E119 Type 2 diabetes mellitus without complications: Secondary | ICD-10-CM | POA: Insufficient documentation

## 2016-03-06 DIAGNOSIS — Z7984 Long term (current) use of oral hypoglycemic drugs: Secondary | ICD-10-CM | POA: Insufficient documentation

## 2016-03-06 DIAGNOSIS — I10 Essential (primary) hypertension: Secondary | ICD-10-CM | POA: Insufficient documentation

## 2016-03-06 DIAGNOSIS — Z794 Long term (current) use of insulin: Secondary | ICD-10-CM | POA: Insufficient documentation

## 2016-03-06 DIAGNOSIS — Z7982 Long term (current) use of aspirin: Secondary | ICD-10-CM | POA: Insufficient documentation

## 2016-03-06 DIAGNOSIS — M5431 Sciatica, right side: Secondary | ICD-10-CM

## 2016-03-06 DIAGNOSIS — M5441 Lumbago with sciatica, right side: Secondary | ICD-10-CM | POA: Insufficient documentation

## 2016-03-06 DIAGNOSIS — Z79899 Other long term (current) drug therapy: Secondary | ICD-10-CM | POA: Insufficient documentation

## 2016-03-06 MED ORDER — CYCLOBENZAPRINE HCL 10 MG PO TABS: 10.0000 mg | ORAL_TABLET | Freq: Three times a day (TID) | ORAL | Status: DC

## 2016-03-06 MED ORDER — HYDROCODONE-ACETAMINOPHEN 5-325 MG PO TABS: 1.0000 | ORAL_TABLET | ORAL | Status: DC | PRN

## 2016-03-06 MED ORDER — NAPROXEN 500 MG PO TABS: 500.0000 mg | ORAL_TABLET | Freq: Two times a day (BID) | ORAL | Status: DC

## 2016-03-06 MED FILL — HYDROCODON-APAP 5-325: 5-325 | 2 days supply | Qty: 12 | Fill #0

## 2016-03-06 MED FILL — NAPROXEN 500 MG TABLET: 500 | 7 days supply | Qty: 14 | Fill #0

## 2016-03-06 MED FILL — ?CYCLOBENZAPRINE 10 MG TABL: 10 | 7 days supply | Qty: 21 | Fill #0

## 2016-03-06 NOTE — ED Provider Notes (Signed)
CSN: [id number]     Arrival date & time 03/06/16  0756 History   First MD Initiated Contact with Patient 03/06/16 [phone]     No chief complaint on file.    (Consider location/radiation/quality/duration/timing/severity/associated sxs/prior Treatment) HPI Comments: Patient presents with complaint of low back and buttock pain radiating to right thigh progressively worsening over the last one week. No known injury or similar pain in the past. No urinary/bowel incontinence, saddle anesthesia, abdominal pain, weakness, or paresthesia.  The history is provided by the patient. No language interpreter was used.    Past Medical History  Diagnosis Date  . Diabetes mellitus without complication ([place])   . Hypertension   . Obesity    Past Surgical History  Procedure Laterality Date  . Cesarean section  1982, 1988  . Abdominal hysterectomy    . Breast surgery      reduction   Family History  Problem Relation Age of Onset  . Diabetes Brother   . Cancer Brother     stomach   Social History  Substance Use Topics  . Smoking status: Never Smoker   . Smokeless tobacco: None  . Alcohol Use: No   OB History    Gravida Para Term Preterm AB TAB SAB Ectopic Multiple Living   2 2 2       2      Review of Systems  Constitutional: Negative for fever.  Gastrointestinal: Negative for abdominal pain.  Genitourinary: Negative for difficulty urinating.  Musculoskeletal: Positive for back pain.  Neurological: Negative for weakness and numbness.      Allergies  Sulfa antibiotics  Home Medications   Prior to Admission medications   Medication Sig Start Date End Date Taking? Authorizing Provider  aspirin EC 81 MG tablet Take 81 mg by mouth daily.    Historical Provider, MD  atorvastatin (LIPITOR) 10 MG tablet Take 1 tablet (10 mg total) by mouth daily. 12/12/15   Tresa Garter, MD  Blood Glucose Monitoring Suppl (TRUE METRIX METER) W/DEVICE KIT Use as directed 09/04/15   Tresa Garter,  MD  escitalopram (LEXAPRO) 10 MG tablet Take 1 tablet (10 mg total) by mouth at bedtime as needed (anxiety/ insomnia). 12/12/15   Tresa Garter, MD  fenofibrate (TRICOR) 145 MG tablet Take 1 tablet (145 mg total) by mouth daily. 12/12/15   Tresa Garter, MD  glipiZIDE (GLUCOTROL) 10 MG tablet Take 1 tablet (10 mg total) by mouth 2 (two) times daily before a meal. 12/12/15   Tresa Garter, MD  glucose blood (TRUE METRIX BLOOD GLUCOSE TEST) test strip Use as instructed 09/04/15   Tresa Garter, MD  hydrochlorothiazide (HYDRODIURIL) 25 MG tablet Take 1 tablet (25 mg total) by mouth daily. 12/12/15   Tresa Garter, MD  insulin aspart (NOVOLOG) 100 UNIT/ML injection Sliding scale: Inject 3 units for blood sugar 150-200 Inject 5 units for blood sugar 201-250 Inject 7 units for blood sugar 251-300 Inject 10 units for blood sugar > 350 and call clinic 11/18/15   Tresa Garter, MD  Insulin Glargine (LANTUS SOLOSTAR) 100 UNIT/ML Solostar Pen Inject 70 Units into the skin daily at 10 pm. 12/12/15   Tresa Garter, MD  Insulin Pen Needle 29G X 12.7MM MISC 10 Units by Does not apply route 1 day or 1 dose. 01/28/15   Lance Bosch, NP  Insulin Syringe-Needle U-100 30G X 5/16" 1 ML MISC Test 3 times daily 05/29/15   Tresa Garter, MD  TRUEPLUS  LANCETS 28G MISC Used as directed 09/04/15   Tresa Garter, MD   BP 179/82 mmHg  Pulse 93  Temp(Src) 98.1 F (36.7 C) (Oral)  Resp 18  Ht 5' 4"  (1.626 m)  Wt 77.111 kg  BMI 29.17 kg/m2  SpO2 100% Physical Exam  Constitutional: She is oriented to person, place, and time. She appears well-developed and well-nourished.  Neck: Normal range of motion.  Pulmonary/Chest: Effort normal.  Abdominal: Soft. There is no tenderness.  Musculoskeletal:  FROM LE's, full strength without deficits. Minimally tender to lumbar spine and right paralumbar area. Palpation of the right buttock elicits radiating pain c/w sciatica.    Neurological: She is alert and oriented to person, place, and time. She has normal reflexes.  No neurosensory deficits of right LE.  Skin: Skin is warm and dry.    ED Course  Procedures (including critical care time) Labs Review Labs Reviewed - No data to display  Imaging Review No results found. I have personally reviewed and evaluated these images and lab results as part of my medical decision-making.   EKG Interpretation None      MDM   Final diagnoses:  None    1. Right sciatica  The patient presents with back and buttock pain in sciatic distribution without neurologic deficits or red flags. Will treat symptomatically.    Charlann Lange, PA-C 03/06/16 [id number]  Davonna Belling, MD 03/06/16 [phone]

## 2016-03-06 NOTE — ED Notes (Addendum)
Pt presents with L buttock pain x 1 week; denies any injury.  Pt reports pain is a deep throb, radiates across to R buttock and down R leg.  Pt denies any urinary symptoms. Pt denies any bladder/bowel incontinence.

## 2016-03-06 NOTE — Discharge Instructions (Signed)

## 2016-03-13 ENCOUNTER — Other Ambulatory Visit: Payer: Self-pay | Admitting: Internal Medicine

## 2016-03-18 MED FILL — $LANTUS SOLOSTAR 100 UNITS/: 100 | 20 days supply | Qty: 15 | Fill #0

## 2016-03-31 MED FILL — HYDROCHLOROTHIAZIDE 25 MG T: 25 | 30 days supply | Qty: 30 | Fill #7

## 2016-04-28 ENCOUNTER — Encounter: Payer: Self-pay | Admitting: Internal Medicine

## 2016-04-28 ENCOUNTER — Ambulatory Visit: Payer: Self-pay | Attending: Internal Medicine | Admitting: Internal Medicine

## 2016-04-28 VITALS — BP 165/84 | HR 108 | Temp 98.6°F | Wt 173.6 lb

## 2016-04-28 DIAGNOSIS — I10 Essential (primary) hypertension: Secondary | ICD-10-CM | POA: Insufficient documentation

## 2016-04-28 DIAGNOSIS — E785 Hyperlipidemia, unspecified: Secondary | ICD-10-CM | POA: Insufficient documentation

## 2016-04-28 DIAGNOSIS — Z1159 Encounter for screening for other viral diseases: Secondary | ICD-10-CM

## 2016-04-28 DIAGNOSIS — E1121 Type 2 diabetes mellitus with diabetic nephropathy: Secondary | ICD-10-CM

## 2016-04-28 DIAGNOSIS — Z833 Family history of diabetes mellitus: Secondary | ICD-10-CM | POA: Insufficient documentation

## 2016-04-28 DIAGNOSIS — E1165 Type 2 diabetes mellitus with hyperglycemia: Secondary | ICD-10-CM | POA: Insufficient documentation

## 2016-04-28 DIAGNOSIS — Z79899 Other long term (current) drug therapy: Secondary | ICD-10-CM | POA: Insufficient documentation

## 2016-04-28 DIAGNOSIS — Z7982 Long term (current) use of aspirin: Secondary | ICD-10-CM | POA: Insufficient documentation

## 2016-04-28 DIAGNOSIS — Z23 Encounter for immunization: Secondary | ICD-10-CM

## 2016-04-28 DIAGNOSIS — Z794 Long term (current) use of insulin: Secondary | ICD-10-CM | POA: Insufficient documentation

## 2016-04-28 DIAGNOSIS — Z808 Family history of malignant neoplasm of other organs or systems: Secondary | ICD-10-CM | POA: Insufficient documentation

## 2016-04-28 LAB — POCT URINALYSIS DIP (MANUAL ENTRY)
BILIRUBIN UA: NEGATIVE
Bilirubin, UA: NEGATIVE
Glucose, UA: 500 — AB
Leukocytes, UA: NEGATIVE
Nitrite, UA: NEGATIVE
PH UA: 5
Protein Ur, POC: 100 — AB
RBC UA: NEGATIVE
SPEC GRAV UA: 1.01
Urobilinogen, UA: 0.2

## 2016-04-28 LAB — CBC WITH DIFFERENTIAL/PLATELET
Basophils Absolute: 0 cells/uL (ref 0–200)
Basophils Relative: 0 %
EOS ABS: 73 {cells}/uL (ref 15–500)
Eosinophils Relative: 1 %
HEMATOCRIT: 30.7 % — AB (ref 35.0–45.0)
Hemoglobin: 10 g/dL — ABNORMAL LOW (ref 11.7–15.5)
LYMPHS PCT: 51 %
Lymphs Abs: 3723 cells/uL (ref 850–3900)
MCH: 28.7 pg (ref 27.0–33.0)
MCHC: 32.6 g/dL (ref 32.0–36.0)
MCV: 88 fL (ref 80.0–100.0)
MONOS PCT: 4 %
MPV: 13.8 fL — ABNORMAL HIGH (ref 7.5–12.5)
Monocytes Absolute: 292 cells/uL (ref 200–950)
Neutro Abs: 3212 cells/uL (ref 1500–7800)
Neutrophils Relative %: 44 %
PLATELETS: 263 10*3/uL (ref 140–400)
RBC: 3.49 MIL/uL — ABNORMAL LOW (ref 3.80–5.10)
RDW: 15.8 % — AB (ref 11.0–15.0)
WBC: 7.3 10*3/uL (ref 3.8–10.8)

## 2016-04-28 LAB — BASIC METABOLIC PANEL WITH GFR
BUN: 42 mg/dL — ABNORMAL HIGH (ref 7–25)
CALCIUM: 9.1 mg/dL (ref 8.6–10.4)
CO2: 23 mmol/L (ref 20–31)
CREATININE: 1.9 mg/dL — AB (ref 0.50–1.05)
Chloride: 99 mmol/L (ref 98–110)
GFR, Est African American: 33 mL/min — ABNORMAL LOW (ref >=60)
GFR, Est Non African American: 29 mL/min — ABNORMAL LOW (ref >=60)
GLUCOSE: 356 mg/dL — AB (ref 65–99)
Potassium: 4.4 mmol/L (ref 3.5–5.3)
Sodium: 132 mmol/L — ABNORMAL LOW (ref 135–146)

## 2016-04-28 LAB — POCT GLYCOSYLATED HEMOGLOBIN (HGB A1C): HEMOGLOBIN A1C: 14.4

## 2016-04-28 LAB — GLUCOSE, POCT (MANUAL RESULT ENTRY)
POC GLUCOSE: 408 mg/dL — AB (ref 70–99)
POC Glucose: 357 mg/dl — AB (ref 70–99)

## 2016-04-28 MED ORDER — INSULIN ASPART 100 UNIT/ML ~~LOC~~ SOLN: SUBCUTANEOUS | Status: DC

## 2016-04-28 MED ORDER — HYDROCHLOROTHIAZIDE 25 MG PO TABS: 25.0000 mg | ORAL_TABLET | Freq: Every day | ORAL | Status: DC

## 2016-04-28 MED ORDER — FENOFIBRATE 145 MG PO TABS: 145.0000 mg | ORAL_TABLET | Freq: Every day | ORAL | Status: DC

## 2016-04-28 MED ORDER — INSULIN GLARGINE 100 UNIT/ML SOLOSTAR PEN: 70.0000 [IU] | PEN_INJECTOR | Freq: Every day | SUBCUTANEOUS | Status: DC

## 2016-04-28 MED ORDER — AMLODIPINE BESYLATE 5 MG PO TABS: 5.0000 mg | ORAL_TABLET | Freq: Every day | ORAL | Status: DC

## 2016-04-28 MED ORDER — INSULIN ASPART 100 UNIT/ML ~~LOC~~ SOLN
20.0000 [IU] | Freq: Once | SUBCUTANEOUS | Status: AC
  Administered 2016-04-28: 20 [IU] via SUBCUTANEOUS

## 2016-04-28 MED ORDER — ATORVASTATIN CALCIUM 20 MG PO TABS: 10.0000 mg | ORAL_TABLET | Freq: Every day | ORAL | Status: DC

## 2016-04-28 MED FILL — FENOFIBRATE 145 MG TABLET: 145 | 30 days supply | Qty: 30 | Fill #0

## 2016-04-28 MED FILL — ?ATORVASTATIN 20 MG TABLET: 20 | 30 days supply | Qty: 15 | Fill #0

## 2016-04-28 MED FILL — AMLODIPINE BESYLATE 5 MG TA: 5 | 30 days supply | Qty: 30 | Fill #0

## 2016-04-28 NOTE — Patient Instructions (Addendum)
Patricia [name] [pharm] 2-3 wks for dm chk  DASH Eating Plan DASH stands for "Dietary Approaches to Stop Hypertension." The DASH eating plan is a healthy eating plan that has been shown to reduce high blood pressure (hypertension). Additional health benefits may include reducing the risk of type 2 diabetes mellitus, heart disease, and stroke. The DASH eating plan may also help with weight loss. WHAT DO I NEED TO KNOW ABOUT THE DASH EATING PLAN? For the DASH eating plan, you will follow these general guidelines:  Choose foods with a percent daily value for sodium of less than 5% (as listed on the food label).  Use salt-free seasonings or herbs instead of table salt or sea salt.  Check with your health care provider or pharmacist before using salt substitutes.  Eat lower-sodium products, often labeled as "lower sodium" or "no salt added."  Eat fresh foods.  Eat more vegetables, fruits, and low-fat dairy products.  Choose whole grains. Look for the word "whole" as the first word in the ingredient list.  Choose fish and skinless chicken or [place] more often than red meat. Limit fish, poultry, and meat to 6 oz (170 g) each day.  Limit sweets, desserts, sugars, and sugary drinks.  Choose heart-healthy fats.  Limit cheese to 1 oz (28 g) per day.  Eat more home-cooked food and less restaurant, buffet, and fast food.  Limit fried foods.  Cook foods using methods other than frying.  Limit canned vegetables. If you do use them, rinse them well to decrease the sodium.  When eating at a restaurant, ask that your food be prepared with less salt, or no salt if possible. WHAT FOODS CAN I EAT? Seek help from a dietitian for individual calorie needs. Grains Whole grain or whole wheat bread. Brown rice. Whole grain or whole wheat pasta. Quinoa, bulgur, and whole grain cereals. Low-sodium cereals. Corn or whole wheat flour tortillas. Whole grain cornbread. Whole grain crackers. Low-sodium  crackers. Vegetables Fresh or frozen vegetables (raw, steamed, roasted, or grilled). Low-sodium or reduced-sodium tomato and vegetable juices. Low-sodium or reduced-sodium tomato sauce and paste. Low-sodium or reduced-sodium canned vegetables.  Fruits All fresh, canned (in natural juice), or frozen fruits. Meat and Other Protein Products Ground beef (85% or leaner), grass-fed beef, or beef trimmed of fat. Skinless chicken or [place]. Ground chicken or [place]. Pork trimmed of fat. All fish and seafood. Eggs. Dried beans, peas, or lentils. Unsalted nuts and seeds. Unsalted canned beans. Dairy Low-fat dairy products, such as skim or 1% milk, 2% or reduced-fat cheeses, low-fat ricotta or cottage cheese, or plain low-fat yogurt. Low-sodium or reduced-sodium cheeses. Fats and Oils Tub margarines without trans fats. Light or reduced-fat mayonnaise and salad dressings (reduced sodium). Avocado. Safflower, olive, or canola oils. Natural peanut or almond butter. Other Unsalted popcorn and pretzels. The items listed above may not be a complete list of recommended foods or beverages. Contact your dietitian for more options. WHAT FOODS ARE NOT RECOMMENDED? Grains White bread. White pasta. White rice. Refined cornbread. Bagels and croissants. Crackers that contain trans fat. Vegetables Creamed or fried vegetables. Vegetables in a cheese sauce. Regular canned vegetables. Regular canned tomato sauce and paste. Regular tomato and vegetable juices. Fruits Dried fruits. Canned fruit in light or heavy syrup. Fruit juice. Meat and Other Protein Products Fatty cuts of meat. Ribs, chicken wings, bacon, sausage, bologna, salami, chitterlings, fatback, hot dogs, bratwurst, and packaged luncheon meats. Salted nuts and seeds. Canned beans with salt. Dairy Whole or 2% milk, cream, half-and-half,  and cream cheese. Whole-fat or sweetened yogurt. Full-fat cheeses or blue cheese. Nondairy creamers and whipped toppings.  Processed cheese, cheese spreads, or cheese curds. Condiments Onion and garlic salt, seasoned salt, table salt, and sea salt. Canned and packaged gravies. Worcestershire sauce. Tartar sauce. Barbecue sauce. Teriyaki sauce. Soy sauce, including reduced sodium. Steak sauce. Fish sauce. Oyster sauce. Cocktail sauce. Horseradish. Ketchup and mustard. Meat flavorings and tenderizers. Bouillon cubes. Hot sauce. Tabasco sauce. Marinades. Taco seasonings. Relishes. Fats and Oils Butter, stick margarine, lard, shortening, ghee, and bacon fat. Coconut, palm kernel, or palm oils. Regular salad dressings. Other Pickles and olives. Salted popcorn and pretzels. The items listed above may not be a complete list of foods and beverages to avoid. Contact your dietitian for more information. WHERE CAN I FIND MORE INFORMATION? National Heart, Lung, and Blood Institute: travelstabloid.com   This information is not intended to replace advice given to you by your health care provider. Make sure you discuss any questions you have with your health care provider.   Document Released: 09/17/2011 Document Revised: 10/19/2014 Document Reviewed: 08/02/2013 Elsevier Interactive Patient Education 2016 Elsevier Inc.   - Diabetes Mellitus and Food It is important for you to manage your blood sugar (glucose) level. Your blood glucose level can be greatly affected by what you eat. Eating healthier foods in the appropriate amounts throughout the day at about the same time each day will help you control your blood glucose level. It can also help slow or prevent worsening of your diabetes mellitus. Healthy eating may even help you improve the level of your blood pressure and reach or maintain a healthy weight.  General recommendations for healthful eating and cooking habits include:  Eating meals and snacks regularly. Avoid going long periods of time without eating to lose weight.  Eating a diet  that consists mainly of plant-based foods, such as fruits, vegetables, nuts, legumes, and whole grains.  Using low-heat cooking methods, such as baking, instead of high-heat cooking methods, such as deep frying. Work with your dietitian to make sure you understand how to use the Nutrition Facts information on food labels. HOW CAN FOOD AFFECT ME? Carbohydrates Carbohydrates affect your blood glucose level more than any other type of food. Your dietitian will help you determine how many carbohydrates to eat at each meal and teach you how to count carbohydrates. Counting carbohydrates is important to keep your blood glucose at a healthy level, especially if you are using insulin or taking certain medicines for diabetes mellitus. Alcohol Alcohol can cause sudden decreases in blood glucose (hypoglycemia), especially if you use insulin or take certain medicines for diabetes mellitus. Hypoglycemia can be a life-threatening condition. Symptoms of hypoglycemia (sleepiness, dizziness, and disorientation) are similar to symptoms of having too much alcohol.  If your health care provider has given you approval to drink alcohol, do so in moderation and use the following guidelines:  Women should not have more than one drink per day, and men should not have more than two drinks per day. One drink is equal to:  12 oz of beer.  5 oz of wine.  1 oz of hard liquor.  Do not drink on an empty stomach.  Keep yourself hydrated. Have water, diet soda, or unsweetened iced tea.  Regular soda, juice, and other mixers might contain a lot of carbohydrates and should be counted. WHAT FOODS ARE NOT RECOMMENDED? As you make food choices, it is important to remember that all foods are not the same. Some foods  have fewer nutrients per serving than other foods, even though they might have the same number of calories or carbohydrates. It is difficult to get your body what it needs when you eat foods with fewer nutrients.  Examples of foods that you should avoid that are high in calories and carbohydrates but low in nutrients include:  Trans fats (most processed foods list trans fats on the Nutrition Facts label).  Regular soda.  Juice.  Candy.  Sweets, such as cake, pie, doughnuts, and cookies.  Fried foods. WHAT FOODS CAN I EAT? Eat nutrient-rich foods, which will nourish your body and keep you healthy. The food you should eat also will depend on several factors, including:  The calories you need.  The medicines you take.  Your weight.  Your blood glucose level.  Your blood pressure level.  Your cholesterol level. You should eat a variety of foods, including:  Protein.  Lean cuts of meat.  Proteins low in saturated fats, such as fish, egg whites, and beans. Avoid processed meats.  Fruits and vegetables.  Fruits and vegetables that may help control blood glucose levels, such as apples, mangoes, and yams.  Dairy products.  Choose fat-free or low-fat dairy products, such as milk, yogurt, and cheese.  Grains, bread, pasta, and rice.  Choose whole grain products, such as multigrain bread, whole oats, and brown rice. These foods may help control blood pressure.  Fats.  Foods containing healthful fats, such as nuts, avocado, olive oil, canola oil, and fish. DOES EVERYONE WITH DIABETES MELLITUS HAVE THE SAME MEAL PLAN? Because every person with diabetes mellitus is different, there is not one meal plan that works for everyone. It is very important that you meet with a dietitian who will help you create a meal plan that is just right for you.   This information is not intended to replace advice given to you by your health care provider. Make sure you discuss any questions you have with your health care provider.   Document Released: 06/25/2005 Document Revised: 10/19/2014 Document Reviewed: 08/25/2013 Elsevier Interactive Patient Education 2016 [place] for Eating  Away From Home If You Have Diabetes Controlling your level of blood glucose, also known as blood sugar, can be challenging. It can be even more difficult when you do not prepare your own meals. The following tips can help you manage your diabetes when you eat away from home. PLANNING AHEAD Plan ahead if you know you will be eating away from home:  Ask your health care provider how to time meals and medicine if you are taking insulin.  Make a list of restaurants near you that offer healthy choices. If they have a carry-out menu, take it home and plan what you will order ahead of time.  Look up the restaurant you want to eat at online. Many chain and fast-food restaurants list nutritional information online. Use this information to choose the healthiest options and to calculate how many carbohydrates will be in your meal.  Use a carbohydrate-counting book or mobile app to look up the carbohydrate content and serving size of the foods you want to eat.  Become familiar with serving sizes and learn to recognize how many servings are in a portion. This will allow you to estimate how many carbohydrates you can eat. FREE FOODS A "free food" is any food or drink that has less than 5 g of carbohydrates per serving. Free foods include:  Many vegetables.  Hard boiled eggs.  Nuts or seeds.  Olives.  Cheeses.  Meats. These types of foods make good appetizer choices and are often available at salad bars. Lemon juice, vinegar, or a low-calorie salad dressing of fewer than 20 calories per serving can be used as a "free" salad dressing.  CHOICES TO REDUCE CARBOHYDRATES  Substitute nonfat sweetened yogurt with a sugar-free yogurt. Yogurt made from soy milk may also be used, but you will still want a sugar-free or plain option to choose a lower carbohydrate amount.  Ask your server to take away the bread basket or chips from your table.  Order fresh fruit. A salad bar often offers fresh fruit choices.  Avoid canned fruit because it is usually packed in sugar or syrup.  Order a salad, and eat it without dressing. Or, create a "free" salad dressing.  Ask for substitutions. For example, instead of [place] fries, request an order of a vegetable such as salad, green beans, or broccoli. OTHER TIPS   If you take insulin, take the insulin once your food arrives to your table. This will ensure your insulin and food are timed correctly.  Ask your server about the portion size before your order, and ask for a take-out box if the portion has more servings than you should have. When your food comes, leave the amount you should have on the plate, and put the rest in the take-out box.  Consider splitting an entree with someone and ordering a side salad.   This information is not intended to replace advice given to you by your health care provider. Make sure you discuss any questions you have with your health care provider.   Document Released: 09/28/2005 Document Revised: 06/19/2015 Document Reviewed: 12/26/2013 Elsevier Interactive Patient Education 2016 [employer].  - Diabetes and Exercise Exercising regularly is important. It is not just about losing weight. It has many health benefits, such as:  Improving your overall fitness, flexibility, and endurance.  Increasing your bone density.  Helping with weight control.  Decreasing your body fat.  Increasing your muscle strength.  Reducing stress and tension.  Improving your overall health. People with diabetes who exercise gain additional benefits because exercise:  Reduces appetite.  Improves the body's use of blood sugar (glucose).  Helps lower or control blood glucose.  Decreases blood pressure.  Helps control blood lipids (such as cholesterol and triglycerides).  Improves the body's use of the hormone insulin by:  Increasing the body's insulin sensitivity.  Reducing the body's insulin needs.  Decreases the risk for heart  disease because exercising:  Lowers cholesterol and triglycerides levels.  Increases the levels of good cholesterol (such as high-density lipoproteins [HDL]) in the body.  Lowers blood glucose levels. YOUR ACTIVITY PLAN  Choose an activity that you enjoy, and set realistic goals. To exercise safely, you should begin practicing any new physical activity slowly, and gradually increase the intensity of the exercise over time. Your health care provider or diabetes educator can help create an activity plan that works for you. General recommendations include:  Encouraging children to engage in at least 60 minutes of physical activity each day.  Stretching and performing strength training exercises, such as yoga or weight lifting, at least 2 times per week.  Performing a total of at least 150 minutes of moderate-intensity exercise each week, such as brisk walking or water aerobics.  Exercising at least 3 days per week, making sure you allow no more than 2 consecutive days to pass without exercising.  Avoiding long periods  of inactivity (90 minutes or more). When you have to spend an extended period of time sitting down, take frequent breaks to walk or stretch. RECOMMENDATIONS FOR EXERCISING WITH TYPE 1 OR TYPE 2 DIABETES   Check your blood glucose before exercising. If blood glucose levels are greater than 240 mg/dL, check for urine ketones. Do not exercise if ketones are present.  Avoid injecting insulin into areas of the body that are going to be exercised. For example, avoid injecting insulin into:  The arms when playing tennis.  The legs when jogging.  Keep a record of:  Food intake before and after you exercise.  Expected peak times of insulin action.  Blood glucose levels before and after you exercise.  The type and amount of exercise you have done.  Review your records with your health care provider. Your health care provider will help you to develop guidelines for adjusting  food intake and insulin amounts before and after exercising.  If you take insulin or oral hypoglycemic agents, watch for signs and symptoms of hypoglycemia. They include:  Dizziness.  Shaking.  Sweating.  Chills.  Confusion.  Drink plenty of water while you exercise to prevent dehydration or heat stroke. Body water is lost during exercise and must be replaced.  Talk to your health care provider before starting an exercise program to make sure it is safe for you. Remember, almost any type of activity is better than none.   This information is not intended to replace advice given to you by your health care provider. Make sure you discuss any questions you have with your health care provider.   Document Released: 12/19/2003 Document Revised: 02/12/2015 Document Reviewed: 03/07/2013 Elsevier Interactive Patient Education 2016 [employer]. Tdap Vaccine (Tetanus, Diphtheria and Pertussis): What You Need to Know  1. Why get vaccinated? Tetanus, diphtheria and pertussis are very serious diseases. Tdap vaccine can protect [place] from these diseases. And, Tdap vaccine given to pregnant women can protect newborn babies against pertussis. TETANUS (Lockjaw) is rare in the [place] today. It causes painful muscle tightening and stiffness, usually all over the body.  It can lead to tightening of muscles in the head and neck so you can't open your mouth, swallow, or sometimes even breathe. Tetanus kills about 1 out of 10 people who are infected even after receiving the best medical care. DIPHTHERIA is also rare in the [place] today. It can cause a thick coating to form in the back of the throat.  It can lead to breathing problems, heart failure, paralysis, and death. PERTUSSIS (Whooping Cough) causes severe coughing spells, which can cause difficulty breathing, vomiting and disturbed sleep.  It can also lead to weight loss, incontinence, and rib fractures. Up to 2 in 100 adolescents and 5  in 100 adults with pertussis are hospitalized or have complications, which could include pneumonia or death. These diseases are caused by bacteria. Diphtheria and pertussis are spread from person to person through secretions from coughing or sneezing. Tetanus enters the body through cuts, scratches, or wounds. Before vaccines, as many as 200,000 cases of diphtheria, 200,000 cases of pertussis, and hundreds of cases of tetanus, were reported in the [place] each year. Since vaccination began, reports of cases for tetanus and diphtheria have dropped by about 99% and for pertussis by about 80%. 2. Tdap vaccine Tdap vaccine can protect adolescents and adults from tetanus, diphtheria, and pertussis. One dose of Tdap is routinely given at age 59 or 23. People who did  not get Tdap at that age should get it as soon as possible. Tdap is especially important for healthcare professionals and anyone having close contact with a baby younger than 12 months. Pregnant women should get a dose of Tdap during every pregnancy, to protect the newborn from pertussis. Infants are most at risk for severe, life-threatening complications from pertussis. Another vaccine, called Td, protects against tetanus and diphtheria, but not pertussis. A Td booster should be given every 10 years. Tdap may be given as one of these boosters if you have never gotten Tdap before. Tdap may also be given after a severe cut or burn to prevent tetanus infection. Your doctor or the person giving you the vaccine can give you more information. Tdap may safely be given at the same time as other vaccines. 3. Some people should not get this vaccine  A person who has ever had a life-threatening allergic reaction after a previous dose of any diphtheria, tetanus or pertussis containing vaccine, OR has a severe allergy to any part of this vaccine, should not get Tdap vaccine. Tell the person giving the vaccine about any severe allergies.  Anyone who had  coma or long repeated seizures within 7 days after a childhood dose of DTP or DTaP, or a previous dose of Tdap, should not get Tdap, unless a cause other than the vaccine was found. They can still get Td.  Talk to your doctor if you:  have seizures or another nervous system problem,  had severe pain or swelling after any vaccine containing diphtheria, tetanus or pertussis,  ever had a condition called Guillain-Barr Syndrome (GBS),  aren't feeling well on the day the shot is scheduled. 4. Risks With any medicine, including vaccines, there is a chance of side effects. These are usually mild and go away on their own. Serious reactions are also possible but are rare. Most people who get Tdap vaccine do not have any problems with it. Mild problems following Tdap (Did not interfere with activities)  Pain where the shot was given (about 3 in 4 adolescents or 2 in 3 adults)  Redness or swelling where the shot was given (about 1 person in 5)  Mild fever of at least 100.32F (up to about 1 in 25 adolescents or 1 in 100 adults)  Headache (about 3 or 4 people in 10)  Tiredness (about 1 person in 3 or 4)  Nausea, vomiting, diarrhea, stomach ache (up to 1 in 4 adolescents or 1 in 10 adults)  Chills, sore joints (about 1 person in 10)  Body aches (about 1 person in 3 or 4)  Rash, swollen glands (uncommon) Moderate problems following Tdap (Interfered with activities, but did not require medical attention)  Pain where the shot was given (up to 1 in 5 or 6)  Redness or swelling where the shot was given (up to about 1 in 16 adolescents or 1 in 12 adults)  Fever over 102F (about 1 in 100 adolescents or 1 in 250 adults)  Headache (about 1 in 7 adolescents or 1 in 10 adults)  Nausea, vomiting, diarrhea, stomach ache (up to 1 or 3 people in 100)  Swelling of the entire arm where the shot was given (up to about 1 in 500). Severe problems following Tdap (Unable to perform usual activities;  required medical attention)  Swelling, severe pain, bleeding and redness in the arm where the shot was given (rare). Problems that could happen after any vaccine:  People sometimes faint after a medical  procedure, including vaccination. Sitting or lying down for about 15 minutes can help prevent fainting, and injuries caused by a fall. Tell your doctor if you feel dizzy, or have vision changes or ringing in the ears.  Some people get severe pain in the shoulder and have difficulty moving the arm where a shot was given. This happens very rarely.  Any medication can cause a severe allergic reaction. Such reactions from a vaccine are very rare, estimated at fewer than 1 in a million doses, and would happen within a few minutes to a few hours after the vaccination. As with any medicine, there is a very remote chance of a vaccine causing a serious injury or death. The safety of vaccines is always being monitored. For more information, visit: http://www.aguilar.org/ 5. What if there is a serious problem? What should I look for?  Look for anything that concerns you, such as signs of a severe allergic reaction, very high fever, or unusual behavior.  Signs of a severe allergic reaction can include hives, swelling of the face and throat, difficulty breathing, a fast heartbeat, dizziness, and weakness. These would usually start a few minutes to a few hours after the vaccination. What should I do?  If you think it is a severe allergic reaction or other emergency that can't wait, call 9-1-1 or get the person to the nearest hospital. Otherwise, call your doctor.  Afterward, the reaction should be reported to the Vaccine Adverse Event Reporting System (VAERS). Your doctor might file this report, or you can do it yourself through the VAERS web site at www.vaers.SamedayNews.es, or by calling (434)493-0712. VAERS does not give medical advice.  6. The National Vaccine Injury Compensation Program The [employer]  Vaccine Injury Compensation Program (VICP) is a federal program that was created to compensate people who may have been injured by certain vaccines. Persons who believe they may have been injured by a vaccine can learn about the program and about filing a claim by calling 680-522-8995 or visiting the [place] website at GoldCloset.com.ee. There is a time limit to file a claim for compensation. 7. How can I learn more?  Ask your doctor. He or she can give you the vaccine package insert or suggest other sources of information.  Call your local or state health department.  Contact the Centers for Disease Control and Prevention (CDC):  Call 2070072575 (1-800-CDC-INFO) or  Visit CDC's website at http://hunter.com/ CDC Tdap Vaccine VIS (12/05/13)   This information is not intended to replace advice given to you by your health care provider. Make sure you discuss any questions you have with your health care provider.   Document Released: 03/29/2012 Document Revised: 10/19/2014 Document Reviewed: 01/10/2014 [employer] Interactive Patient Education [employer].

## 2016-04-28 NOTE — Progress Notes (Signed)
Patricia [name], is a 60 y.o. female  PRF:[id number]  DJT:[id number]  DOB - 08-05-1956  CC:  Chief Complaint  Patient presents with  . Establish Care       HPI: Patricia [name] is a 60 y.o. female here today to establish medical care, w/ signif PMHx of IDDM, htn, hld, last seen in clinic 3/17.  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 Describes feeling tired w/o energy for months.  Not eating like she should, in terms of salt or carb limiting diet.  Does not smoke or drink etoh.  Sedentary lifestyle. Minimal exercise.  Patient has No headache, No chest pain, No abdominal pain - No Nausea, No new weakness tingling or numbness, No Cough - SOB.  Pt is here w/ her grandkids and dgt.  Review of Systems: Constitutional: Negative for fever, chills, diaphoresis, activity change, appetite change and fatigue. HENT: Negative for ear pain, nosebleeds, congestion, facial swelling, rhinorrhea, neck pain, neck stiffness and ear discharge.  Eyes: Negative for pain, discharge, redness, itching and visual disturbance. Respiratory: Negative for cough, choking, chest tightness, shortness of breath, wheezing and stridor.  Cardiovascular: Negative for chest pain, palpitations and leg swelling. Gastrointestinal: Negative for abdominal distention. Genitourinary: Negative for dysuria, urgency, frequency, hematuria, flank pain, decreased urine volume, difficulty urinating and dyspareunia.  Musculoskeletal: Negative for back pain, joint swelling, arthralgia and gait problem. Neurological: Negative for dizziness, tremors, seizures, syncope, facial asymmetry, speech difficulty, weakness, light-headedness, numbness and headaches.  Hematological: Negative for adenopathy. Does not bruise/bleed easily. Psychiatric/Behavioral: Negative for hallucinations, behavioral problems, confusion, dysphoric mood, decreased concentration and agitation.    Allergies  Allergen Reactions  . Codone [Hydrocodone] Itching  . Sulfa  Antibiotics Itching and Rash   Past Medical History  Diagnosis Date  . Diabetes mellitus without complication ([place])   . Hypertension   . Obesity    Current Outpatient Prescriptions on File Prior to Visit  Medication Sig Dispense Refill  . aspirin EC 81 MG tablet Take 81 mg by mouth daily.    . Blood Glucose Monitoring Suppl (TRUE METRIX METER) W/DEVICE KIT Use as directed 1 kit 0  . cyclobenzaprine (FLEXERIL) 10 MG tablet Take 1 tablet (10 mg total) by mouth 3 (three) times daily. 21 tablet 0  . escitalopram (LEXAPRO) 10 MG tablet Take 1 tablet (10 mg total) by mouth at bedtime as needed (anxiety/ insomnia). 30 tablet 3  . glipiZIDE (GLUCOTROL) 10 MG tablet Take 1 tablet (10 mg total) by mouth 2 (two) times daily before a meal. 180 tablet 3  . glucose blood (TRUE METRIX BLOOD GLUCOSE TEST) test strip Use as instructed 100 each 12  . Insulin Pen Needle 29G X 12.7MM MISC 10 Units by Does not apply route 1 day or 1 dose. 100 each 11  . Insulin Syringe-Needle U-100 30G X 5/16" 1 ML MISC Test 3 times daily 100 each 11  . naproxen (NAPROSYN) 500 MG tablet Take 1 tablet (500 mg total) by mouth 2 (two) times daily. 14 tablet 0  . TRUEPLUS LANCETS 28G MISC Used as directed 1 each 12  . [DISCONTINUED] quinapril (ACCUPRIL) 5 MG tablet Take by mouth at bedtime.     No current facility-administered medications on file prior to visit.   Family History  Problem Relation Age of Onset  . Diabetes Brother   . Cancer Brother     stomach   Social History   Social History  . Marital Status: Divorced    Spouse Name: N/A  . Number  of Children: N/A  . Years of Education: N/A   Occupational History  . Not on file.   Social History Main Topics  . Smoking status: Never Smoker   . Smokeless tobacco: Not on file  . Alcohol Use: No  . Drug Use: No  . Sexual Activity: Not Currently    Birth Control/ Protection: Surgical   Other Topics Concern  . Not on file   Social History Narrative     Objective:   Filed Vitals:   04/28/16 1114  BP: 165/84  Pulse: 108  Temp: 98.6 F (37 C)    Filed Weights   04/28/16 1114  Weight: 173 lb 9.6 oz (78.744 kg)    BP Readings from Last 3 Encounters:  04/28/16 165/84  03/06/16 179/82  12/12/15 157/82    Physical Exam: Constitutional: Patient appears well-developed and well-nourished. No distress. AAOx3 HENT: Normocephalic, atraumatic, External right and left ear normal. Oropharynx is clear and moist.  Eyes: Conjunctivae and EOM are normal. PERRL, no scleral icterus. Neck: Normal ROM. Neck supple. No JVD. No tracheal deviation. No thyromegaly. CVS: RRR, S1/S2 +, no murmurs, no gallops, no carotid bruit.  Pulmonary: Effort and breath sounds normal, no stridor, rhonchi, wheezes, rales.  Abdominal: Soft. BS +, no distension, tenderness, rebound or guarding.  Musculoskeletal: Normal range of motion. No edema and no tenderness.  LE: bilat/ no c/c/e, pulses 2+ bilateral. Lymphadenopathy: No lymphadenopathy noted, cervical, inguinal or axillary Neuro: Alert. Normal reflexes, muscle tone coordination wnl. No cranial nerve deficit grossly. Skin: Skin is warm and dry. No rash noted. Not diaphoretic. No erythema. No pallor. Psychiatric: Normal mood and affect. Behavior, judgment, thought content normal.  Lab Results  Component Value Date   WBC 7.4 11/15/2014   HGB 10.9* 11/15/2014   HCT 32.7* 11/15/2014   MCV 87.4 11/15/2014   PLT 265 11/15/2014   Lab Results  Component Value Date   CREATININE 1.45* 08/15/2015   BUN 39* 08/15/2015   NA 133* 08/15/2015   K 4.1 08/15/2015   CL 100 08/15/2015   CO2 23 08/15/2015    Lab Results  Component Value Date   HGBA1C 14.4 04/28/2016   Lipid Panel     Component Value Date/Time   CHOL 424* 08/15/2015 1219   TRIG 707* 08/15/2015 1219   HDL 41* 08/15/2015 1219   CHOLHDL 10.3* 08/15/2015 1219   VLDL NOT CALC 08/15/2015 1219   LDLCALC NOT CALC 08/15/2015 1219       Depression  screen PHQ 2/9 04/28/2016 11/06/2015 08/15/2015 11/15/2014  Decreased Interest 3 2 2  0  Down, Depressed, Hopeless 3 2 2 1   PHQ - 2 Score 6 4 4 1   Altered sleeping 2 1 2  -  Tired, decreased energy 3 3 2  -  Change in appetite 2 2 2  -  Feeling bad or failure about yourself  3 2 2  -  Trouble concentrating 2 2 0 -  Moving slowly or fidgety/restless 1 1 0 -  Suicidal thoughts 0 0 0 -  PHQ-9 Score 19 15 12  -  Difficult doing work/chores Very difficult Somewhat difficult - -    Assessment and plan:   1. Type 2 diabetes mellitus with hyperglycemia, with long-term current use of insulin (HCC) OOC, mix of diet and medcation noncompliance, did not take insulin this am, ate a muffin, doing SSI novolog 2x day, not doing pm dose. - discussed w/ pt novolog tid w/ meals, keep same lantus - POCT glucose (manual entry) - POCT glycosylated hemoglobin (  Hb A1C) - POCT urinalysis dipstick - BASIC METABOLIC PANEL WITH GFR - CBC with Differential - insulin aspart (novoLOG) injection 20 Units; Inject 0.2 mLs (20 Units total) into the skin once. - f/u w/ [facility] 2-3 wks for dm chk - asked her to chk CBG 3-4 times a day and document in book, bring to f/u appt. - continue glipizide and lantus 70 qhs  2. Essential hypertension Recd DASH diet, goal sbp <140/90, - hydrochlorothiazide (HYDRODIURIL) 25 MG tablet; Take 1 tablet (25 mg total) by mouth daily.  Dispense: 90 tablet; Refill: 3 - added norvasc 5 qday added  3. Dyslipidemia, looks partly genetic per labs - recd fasting next time to chk lipids - atorvastatin (LIPITOR) 20 MG tablet; Take 0.5 tablets (10 mg total) by mouth daily.  Dispense: 90 tablet; Refill: 3 - fenofibrate (TRICOR) 145 MG tablet; Take 1 tablet (145 mg total) by mouth daily.  Dispense: 90 tablet; Refill: 3  4. Diabetic nephropathy associated with type 2 diabetes mellitus (HCC) - chk bmp, if ok will start neurontin  5. Need for hepatitis C screening test - Hepatitis C  antibody  6. Health maintanance tdap today.   Return in about 3 months (around 07/29/2016) for htn/dm.  The patient was given clear instructions to go to ER or return to medical center if symptoms don't improve, worsen or new problems develop. The patient verbalized understanding. The patient was told to call to get lab results if they haven't heard anything in the next week.    This note has been created with Surveyor, quantity. Any transcriptional errors are unintentional.   Maren Reamer, MD, [place], [place]   04/28/2016, 11:50 AM

## 2016-04-28 NOTE — Addendum Note (Signed)
Addended by: Tommas Olp B on: 04/28/2016 12:26 PM   Modules accepted: Orders

## 2016-04-29 ENCOUNTER — Telehealth: Payer: Self-pay | Admitting: Internal Medicine

## 2016-04-29 ENCOUNTER — Other Ambulatory Visit: Payer: Self-pay | Admitting: Internal Medicine

## 2016-04-29 LAB — HEPATITIS C ANTIBODY: HCV Ab: NEGATIVE

## 2016-04-29 MED ORDER — AMLODIPINE BESYLATE 5 MG PO TABS: 5.0000 mg | ORAL_TABLET | Freq: Two times a day (BID) | ORAL | Status: DC

## 2016-04-29 MED FILL — HYDROCHLOROTHIAZIDE 25 MG T: 25 | 30 days supply | Qty: 30 | Fill #8

## 2016-04-29 NOTE — Telephone Encounter (Signed)
Left vm to call about labs

## 2016-04-29 NOTE — Telephone Encounter (Signed)
Attempted to call again. No answer, will forward to nursing staff to call

## 2016-04-30 MED FILL — ?GLIPIZIDE 10 MG TABLET: 10 | 30 days supply | Qty: 60 | Fill #6

## 2016-04-30 MED FILL — $LANTUS SOLOSTAR 100 UNITS/: 100 | 30 days supply | Qty: 21 | Fill #0

## 2016-04-30 MED FILL — !NOVOLOG 100UNITS/ML VIAL: 100/ML | 40 days supply | Qty: 20 | Fill #0

## 2016-05-01 ENCOUNTER — Ambulatory Visit: Payer: Self-pay | Attending: Internal Medicine

## 2016-05-01 ENCOUNTER — Telehealth: Payer: Self-pay

## 2016-05-01 NOTE — Telephone Encounter (Signed)
-----   Message from Maren Reamer, MD sent at 04/29/2016 10:03 AM EDT ----- Please call. I attempted to call her 2x, left vm.  Kidney function is worse compared to 82months ago, need to tighter control BP and dm to prevent worsening disease.  I stopped the hctz I rx yesterday, and increased norvasc to 5bid for htn. Remains anemic, suspect due to her ckd stage 3B.  Once ckd 4-5, we have to start considering dialysis, so hopefully we can stop this progression w/ tighter control and closer f/u. Neg hep c on screening. thanks

## 2016-05-01 NOTE — Telephone Encounter (Signed)
Clld pt - adsvd of lab results; change in medication - stopping HCTZ and increasing Norvasc 5 mg BID. Pt stated she understood.

## 2016-05-18 MED FILL — ?AMLODIPINE BESYLATE 5 MG T: 5 | 30 days supply | Qty: 30 | Fill #1

## 2016-05-19 ENCOUNTER — Other Ambulatory Visit: Payer: Self-pay | Admitting: Pharmacist

## 2016-05-19 MED ORDER — AMLODIPINE BESYLATE 5 MG PO TABS: 5.0000 mg | ORAL_TABLET | Freq: Two times a day (BID) | ORAL | 3 refills | Status: DC

## 2016-05-29 MED FILL — glipiZIDE 10 MG TABS: 10 | 30 days supply | Qty: 60 | Fill #7

## 2016-06-01 MED FILL — ?ATORVASTATIN 20 MG TABLET: 20 | 30 days supply | Qty: 15 | Fill #1

## 2016-06-01 MED FILL — $LANTUS SOLOSTAR 100 UNITS/: 100 | 30 days supply | Qty: 21 | Fill #1

## 2016-06-01 MED FILL — !NOVOLOG 100UNITS/ML VIAL: 100/ML | 40 days supply | Qty: 20 | Fill #1

## 2016-06-01 MED FILL — FENOFIBRATE 145 MG TABLET: 145 | 30 days supply | Qty: 30 | Fill #1

## 2016-06-03 MED FILL — AMLODIPINE BESYLATE 5 MG TA: 5 | 30 days supply | Qty: 60 | Fill #0

## 2016-06-29 MED FILL — ?GLIPIZIDE 10 MG TABLET: 10 | 30 days supply | Qty: 60 | Fill #8

## 2016-07-06 MED FILL — FENOFIBRATE 145 MG TABLET: 145 | 30 days supply | Qty: 30 | Fill #2

## 2016-07-06 MED FILL — AMLODIPINE BESYLATE 5 MG TA: 5 | 30 days supply | Qty: 60 | Fill #1

## 2016-07-06 MED FILL — ATORVASTATIN 20 MG TABLET: 20 | 30 days supply | Qty: 15 | Fill #2

## 2016-07-28 MED FILL — glipiZIDE 10 MG TABS: 10 | 30 days supply | Qty: 60 | Fill #9

## 2016-08-03 MED FILL — ?AMLODIPINE BESYLATE 5 MG T: 5 | 30 days supply | Qty: 60 | Fill #2

## 2016-08-03 MED FILL — FENOFIBRATE 145 MG TABLET: 145 | 30 days supply | Qty: 30 | Fill #3

## 2016-08-05 ENCOUNTER — Other Ambulatory Visit: Payer: Self-pay | Admitting: Pharmacist

## 2016-08-05 MED ORDER — INSULIN GLARGINE 100 UNIT/ML SOLOSTAR PEN: 70.0000 [IU] | PEN_INJECTOR | Freq: Every day | SUBCUTANEOUS | 0 refills | Status: DC

## 2016-08-05 MED FILL — $LANTUS SOLOSTAR 100 UNITS/: 100 | 4 days supply | Qty: 3 | Fill #2

## 2016-08-05 MED FILL — $LANTUS SOLOSTAR 100 UNITS/: 100 | 21 days supply | Qty: 15 | Fill #0

## 2016-08-05 MED FILL — ?ATORVASTATIN 20 MG TABLET: 20 | 30 days supply | Qty: 15 | Fill #3

## 2016-08-10 ENCOUNTER — Ambulatory Visit: Payer: Self-pay | Attending: Internal Medicine | Admitting: Internal Medicine

## 2016-08-10 VITALS — BP 152/78 | HR 101 | Temp 97.5°F | Wt 176.0 lb

## 2016-08-10 DIAGNOSIS — E1121 Type 2 diabetes mellitus with diabetic nephropathy: Secondary | ICD-10-CM | POA: Insufficient documentation

## 2016-08-10 DIAGNOSIS — E669 Obesity, unspecified: Secondary | ICD-10-CM | POA: Insufficient documentation

## 2016-08-10 DIAGNOSIS — E1122 Type 2 diabetes mellitus with diabetic chronic kidney disease: Secondary | ICD-10-CM | POA: Insufficient documentation

## 2016-08-10 DIAGNOSIS — I10 Essential (primary) hypertension: Secondary | ICD-10-CM

## 2016-08-10 DIAGNOSIS — Z23 Encounter for immunization: Secondary | ICD-10-CM

## 2016-08-10 DIAGNOSIS — N184 Chronic kidney disease, stage 4 (severe): Secondary | ICD-10-CM | POA: Insufficient documentation

## 2016-08-10 DIAGNOSIS — Z794 Long term (current) use of insulin: Secondary | ICD-10-CM | POA: Insufficient documentation

## 2016-08-10 DIAGNOSIS — I129 Hypertensive chronic kidney disease with stage 1 through stage 4 chronic kidney disease, or unspecified chronic kidney disease: Secondary | ICD-10-CM | POA: Insufficient documentation

## 2016-08-10 DIAGNOSIS — E785 Hyperlipidemia, unspecified: Secondary | ICD-10-CM | POA: Insufficient documentation

## 2016-08-10 DIAGNOSIS — Z7982 Long term (current) use of aspirin: Secondary | ICD-10-CM | POA: Insufficient documentation

## 2016-08-10 DIAGNOSIS — Z09 Encounter for follow-up examination after completed treatment for conditions other than malignant neoplasm: Secondary | ICD-10-CM | POA: Insufficient documentation

## 2016-08-10 DIAGNOSIS — N183 Chronic kidney disease, stage 3 unspecified: Secondary | ICD-10-CM

## 2016-08-10 LAB — CBC WITH DIFFERENTIAL/PLATELET
BASOS ABS: 0 {cells}/uL (ref 0–200)
Basophils Relative: 0 %
EOS ABS: 83 {cells}/uL (ref 15–500)
Eosinophils Relative: 1 %
HEMATOCRIT: 28.2 % — AB (ref 35.0–45.0)
Hemoglobin: 9.1 g/dL — ABNORMAL LOW (ref 11.7–15.5)
LYMPHS PCT: 41 %
Lymphs Abs: 3403 cells/uL (ref 850–3900)
MCH: 26.9 pg — AB (ref 27.0–33.0)
MCHC: 32.3 g/dL (ref 32.0–36.0)
MCV: 83.4 fL (ref 80.0–100.0)
MONOS PCT: 5 %
MPV: 12.4 fL (ref 7.5–12.5)
Monocytes Absolute: 415 cells/uL (ref 200–950)
Neutro Abs: 4399 cells/uL (ref 1500–7800)
Neutrophils Relative %: 53 %
PLATELETS: 407 10*3/uL — AB (ref 140–400)
RBC: 3.38 MIL/uL — ABNORMAL LOW (ref 3.80–5.10)
RDW: 15 % (ref 11.0–15.0)
WBC: 8.3 10*3/uL (ref 3.8–10.8)

## 2016-08-10 LAB — LIPID PANEL
CHOL/HDL RATIO: 3.5 ratio (ref <=5.0)
Cholesterol: 171 mg/dL (ref 125–200)
HDL: 49 mg/dL (ref >=46)
LDL CALC: 82 mg/dL (ref <130)
Triglycerides: 198 mg/dL — ABNORMAL HIGH (ref <150)
VLDL: 40 mg/dL — ABNORMAL HIGH (ref <30)

## 2016-08-10 LAB — BASIC METABOLIC PANEL WITH GFR
BUN: 35 mg/dL — ABNORMAL HIGH (ref 7–25)
CALCIUM: 9.5 mg/dL (ref 8.6–10.4)
CO2: 25 mmol/L (ref 20–31)
CREATININE: 1.93 mg/dL — AB (ref 0.50–1.05)
Chloride: 106 mmol/L (ref 98–110)
GFR, Est African American: 33 mL/min — ABNORMAL LOW (ref >=60)
GFR, Est Non African American: 28 mL/min — ABNORMAL LOW (ref >=60)
GLUCOSE: 77 mg/dL (ref 65–99)
Potassium: 3.8 mmol/L (ref 3.5–5.3)
Sodium: 139 mmol/L (ref 135–146)

## 2016-08-10 LAB — POCT CBG (FASTING - GLUCOSE)-MANUAL ENTRY: Glucose Fasting, POC: 94 mg/dL (ref 70–99)

## 2016-08-10 LAB — POCT GLYCOSYLATED HEMOGLOBIN (HGB A1C): Hemoglobin A1C: 13.7

## 2016-08-10 MED ORDER — HYDRALAZINE HCL 10 MG PO TABS: 10.0000 mg | ORAL_TABLET | Freq: Three times a day (TID) | ORAL | 3 refills | Status: DC

## 2016-08-10 MED ORDER — GABAPENTIN 100 MG PO CAPS: 100.0000 mg | ORAL_CAPSULE | Freq: Every day | ORAL | 3 refills | Status: DC

## 2016-08-10 MED ORDER — INSULIN ASPART 100 UNIT/ML ~~LOC~~ SOLN: SUBCUTANEOUS | 3 refills | Status: DC

## 2016-08-10 MED ORDER — INSULIN GLARGINE 100 UNIT/ML SOLOSTAR PEN: 40.0000 [IU] | PEN_INJECTOR | Freq: Two times a day (BID) | SUBCUTANEOUS | 9 refills | Status: DC

## 2016-08-10 MED FILL — !NOVOLOG 100UNITS/ML VIAL: 100/ML | 22 days supply | Qty: 10 | Fill #0

## 2016-08-10 MED FILL — GABAPENTIN 100 MG CAPSULE: 100 | 30 days supply | Qty: 30 | Fill #0

## 2016-08-10 MED FILL — hydrALAZINE HCL 10 MG TABS: 10 | 30 days supply | Qty: 90 | Fill #0

## 2016-08-10 NOTE — Progress Notes (Signed)
Pt is here for follow up Pt has a little pain in her left foot - 7 (cream no meds) Pt CBG-94 A1C-13.7

## 2016-08-10 NOTE — Progress Notes (Signed)
94 cbg

## 2016-08-10 NOTE — Progress Notes (Signed)
Patricia [name], is a 60 y.o. female  YME:[id number]  WKG:[id number]  DOB - 13-Dec-1955  Chief Complaint  Patient presents with  . Follow-up        Subjective:   Patricia [name] is a 60 y.o. female here today for a follow up visit, last seen 04/28/16. Pt states now eating more frozen than canned vegis, and tries to decrease salt intake as well. She does use [employer] seasoning and eats some boxed goods.  Cooks most of her meals.   Under stress recently b/c brother s/p toe amputations for OOC dm.   She does not smoke or drink etoh.  C/o of numbness /tingling on surface of her toes constantly, denies pain.    Typical am cbg for her 160s, and afternoon cbgs running 230s.   Patient has No headache, No chest pain, No abdominal pain - No Nausea, No new weakness tingling or numbness, No Cough - SOB.  Problem  Ckd (Chronic Kidney Disease) Stage 3, Gfr 30-59 Ml/Min    ALLERGIES: Allergies  Allergen Reactions  . Codone [Hydrocodone] Itching  . Sulfa Antibiotics Itching and Rash    PAST MEDICAL HISTORY: Past Medical History:  Diagnosis Date  . Diabetes mellitus without complication ([place])   . Hypertension   . Obesity     MEDICATIONS AT HOME: Prior to Admission medications   Medication Sig Start Date End Date Taking? Authorizing Provider  amLODipine (NORVASC) 5 MG tablet Take 1 tablet (5 mg total) by mouth 2 (two) times daily. 05/19/16   Maren Reamer, MD  aspirin EC 81 MG tablet Take 81 mg by mouth daily.    Historical Provider, MD  atorvastatin (LIPITOR) 20 MG tablet Take 0.5 tablets (10 mg total) by mouth daily. 04/28/16   Maren Reamer, MD  Blood Glucose Monitoring Suppl (TRUE METRIX METER) W/DEVICE KIT Use as directed 09/04/15   Tresa Garter, MD  cyclobenzaprine (FLEXERIL) 10 MG tablet Take 1 tablet (10 mg total) by mouth 3 (three) times daily. 03/06/16   Charlann Lange, PA-C  escitalopram (LEXAPRO) 10 MG tablet Take 1 tablet (10 mg total) by mouth at bedtime as needed  (anxiety/ insomnia). 12/12/15   Tresa Garter, MD  fenofibrate (TRICOR) 145 MG tablet Take 1 tablet (145 mg total) by mouth daily. 04/28/16   Maren Reamer, MD  gabapentin (NEURONTIN) 100 MG capsule Take 1 capsule (100 mg total) by mouth at bedtime. 08/10/16   Maren Reamer, MD  glipiZIDE (GLUCOTROL) 10 MG tablet Take 1 tablet (10 mg total) by mouth 2 (two) times daily before a meal. 12/12/15   Tresa Garter, MD  glucose blood (TRUE METRIX BLOOD GLUCOSE TEST) test strip Use as instructed 09/04/15   Tresa Garter, MD  hydrALAZINE (APRESOLINE) 10 MG tablet Take 1 tablet (10 mg total) by mouth 3 (three) times daily. 08/10/16   Maren Reamer, MD  insulin aspart (NOVOLOG) 100 UNIT/ML injection 15 units before meals; Unless eating small meal, Inject 10 units prior. 08/10/16   Maren Reamer, MD  Insulin Glargine (LANTUS SOLOSTAR) 100 UNIT/ML Solostar Pen Inject 40 Units into the skin 2 (two) times daily. 08/10/16   Maren Reamer, MD  Insulin Pen Needle 29G X 12.7MM MISC 10 Units by Does not apply route 1 day or 1 dose. 01/28/15   Lance Bosch, NP  Insulin Syringe-Needle U-100 30G X 5/16" 1 ML MISC Test 3 times daily 05/29/15   Tresa Garter, MD  naproxen (NAPROSYN) 500  MG tablet Take 1 tablet (500 mg total) by mouth 2 (two) times daily. 03/06/16   Charlann Lange, PA-C  TRUEPLUS LANCETS 28G MISC Used as directed 09/04/15   Tresa Garter, MD     Objective:   Vitals:   08/10/16 1157  BP: (!) 152/78  Pulse: (!) 101  Temp: 97.5 F (36.4 C)  TempSrc: Oral  SpO2: 97%  Weight: 176 lb (79.8 kg)    Exam General appearance : Awake, alert, not in any distress. Speech Clear. Not toxic looking, pleasant. HEENT: Atraumatic and Normocephalic, pupils equally reactive to light.  bilat TMs clear. Neck: supple, no JVD.  Chest:Good air entry bilaterally, no added sounds. CVS: S1 S2 regular, no murmurs/gallups or rubs. Abdomen: Bowel sounds active, Non tender and not  distended with no gaurding, rigidity or rebound. Foot exam: bilateral peripheral pulses 2+ (dorsalis pedis and post tibialis pulses), no ulcers noted/no ecchymosis, warm to touch, monofilament testing 3/3 bilat. Sensation intact.  No c/c/e. Neurology: Awake alert, and oriented X 3, CN II-XII grossly intact, Non focal Skin:No Rash  Data Review Lab Results  Component Value Date   HGBA1C 14.4 04/28/2016   HGBA1C 14.50 11/20/2015   HGBA1C >15.0 08/15/2015    Depression screen PHQ 2/9 08/10/2016 04/28/2016 11/06/2015 08/15/2015 11/15/2014  Decreased Interest 1 3 2 2  0  Down, Depressed, Hopeless 2 3 2 2 1   PHQ - 2 Score 3 6 4 4 1   Altered sleeping 1 2 1 2  -  Tired, decreased energy 3 3 3 2  -  Change in appetite 1 2 2 2  -  Feeling bad or failure about yourself  2 3 2 2  -  Trouble concentrating 1 2 2  0 -  Moving slowly or fidgety/restless 1 1 1  0 -  Suicidal thoughts 0 0 0 0 -  PHQ-9 Score 12 19 15 12  -  Difficult doing work/chores - Very difficult Somewhat difficult - -      Assessment & Plan   1. Essential hypertension, benign Elevated. Continue norvasc 5bid. Add hydralazine 10tid. Low Salt diet discussed, lowry's seasoning has a lot of sodium  2. Diabetic nephropathy associated with type 2 diabetes mellitus (HCC) Slightly better a1c, but still ooc. - increase lantus to 40 bid. - increase novolog to 15 tid, she was doing 10units at night. Suspect has lots of room to go up on this. - POCT CBG (Fasting - Glucose) - HgB A1c 13.7 - CBC with Differential - Ambulatory referral to Ophthalmology - recd keep cbs at least tid for now, bring cbg readings on next visit - f/u [facility] 2 wks. Dm chk. - continue asa 67 and statin  3. Dyslipidemia Continue statin. - Lipid Panel - not fasting though  4. CKD (chronic kidney disease) stage 3, GFR 30-59 ml/min - BASIC METABOLIC PANEL WITH GFR - Microalbumin/Creatinine Ratio, Urine - if improved, may benefit w/ acei.  5.  Pneumococcal 23 v today. 6. Flu vac today.    Patient have been counseled extensively about nutrition and exercise  Return in about 8 weeks (around 10/05/2016) for dm / htn.  The patient was given clear instructions to go to ER or return to medical center if symptoms don't improve, worsen or new problems develop. The patient verbalized understanding. The patient was told to call to get lab results if they haven't heard anything in the next week.   This note has been created with Surveyor, quantity. Any transcriptional errors are  unintentional.   Maren Reamer, MD, [place] [facility], [place]   08/10/2016, 12:44 PM

## 2016-08-10 NOTE — Patient Instructions (Addendum)
[name] 2 wks for dm check  Check blood sugars on waking up, prior to bed  For the next 2 wks as well. Also check blood sugars about 2 hours after a meal and do this after different meals by rotation  Recommended blood sugar levels on waking up is 90-130 and about 2 hours after meal is 130-160  Please bring your blood sugar monitor to each visit, thank you  Restart exercise  Stay on Lipitor//choleseterol med. --  Diabetes Mellitus and Food It is important for you to manage your blood sugar (glucose) level. Your blood glucose level can be greatly affected by what you eat. Eating healthier foods in the appropriate amounts throughout the day at about the same time each day will help you control your blood glucose level. It can also help slow or prevent worsening of your diabetes mellitus. Healthy eating may even help you improve the level of your blood pressure and reach or maintain a healthy weight.  General recommendations for healthful eating and cooking habits include:  Eating meals and snacks regularly. Avoid going long periods of time without eating to lose weight.  Eating a diet that consists mainly of plant-based foods, such as fruits, vegetables, nuts, legumes, and whole grains.  Using low-heat cooking methods, such as baking, instead of high-heat cooking methods, such as deep frying. Work with your dietitian to make sure you understand how to use the Nutrition Facts information on food labels. HOW CAN FOOD AFFECT ME? Carbohydrates Carbohydrates affect your blood glucose level more than any other type of food. Your dietitian will help you determine how many carbohydrates to eat at each meal and teach you how to count carbohydrates. Counting carbohydrates is important to keep your blood glucose at a healthy level, especially if you are using insulin or taking certain medicines for diabetes mellitus. Alcohol Alcohol can cause sudden decreases in blood glucose  (hypoglycemia), especially if you use insulin or take certain medicines for diabetes mellitus. Hypoglycemia can be a life-threatening condition. Symptoms of hypoglycemia (sleepiness, dizziness, and disorientation) are similar to symptoms of having too much alcohol.  If your health care provider has given you approval to drink alcohol, do so in moderation and use the following guidelines:  Women should not have more than one drink per day, and men should not have more than two drinks per day. One drink is equal to:  12 oz of beer.  5 oz of wine.  1 oz of hard liquor.  Do not drink on an empty stomach.  Keep yourself hydrated. Have water, diet soda, or unsweetened iced tea.  Regular soda, juice, and other mixers might contain a lot of carbohydrates and should be counted. WHAT FOODS ARE NOT RECOMMENDED? As you make food choices, it is important to remember that all foods are not the same. Some foods have fewer nutrients per serving than other foods, even though they might have the same number of calories or carbohydrates. It is difficult to get your body what it needs when you eat foods with fewer nutrients. Examples of foods that you should avoid that are high in calories and carbohydrates but low in nutrients include:  Trans fats (most processed foods list trans fats on the Nutrition Facts label).  Regular soda.  Juice.  Candy.  Sweets, such as cake, pie, doughnuts, and cookies.  Fried foods. WHAT FOODS CAN I EAT? Eat nutrient-rich foods, which will nourish your body and keep you healthy. The food you should  eat also will depend on several factors, including:  The calories you need.  The medicines you take.  Your weight.  Your blood glucose level.  Your blood pressure level.  Your cholesterol level. You should eat a variety of foods, including:  Protein.  Lean cuts of meat.  Proteins low in saturated fats, such as fish, egg whites, and beans. Avoid processed  meats.  Fruits and vegetables.  Fruits and vegetables that may help control blood glucose levels, such as apples, mangoes, and yams.  Dairy products.  Choose fat-free or low-fat dairy products, such as milk, yogurt, and cheese.  Grains, bread, pasta, and rice.  Choose whole grain products, such as multigrain bread, whole oats, and brown rice. These foods may help control blood pressure.  Fats.  Foods containing healthful fats, such as nuts, avocado, olive oil, canola oil, and fish. DOES EVERYONE WITH DIABETES MELLITUS HAVE THE SAME MEAL PLAN? Because every person with diabetes mellitus is different, there is not one meal plan that works for everyone. It is very important that you meet with a dietitian who will help you create a meal plan that is just right for you.   This information is not intended to replace advice given to you by your health care provider. Make sure you discuss any questions you have with your health care provider.   Document Released: 06/25/2005 Document Revised: 10/19/2014 Document Reviewed: 08/25/2013 Elsevier Interactive Patient Education 2016 [place] for Eating Away From Home If You Have Diabetes Controlling your level of blood glucose, also known as blood sugar, can be challenging. It can be even more difficult when you do not prepare your own meals. The following tips can help you manage your diabetes when you eat away from home. PLANNING AHEAD Plan ahead if you know you will be eating away from home:  Ask your health care provider how to time meals and medicine if you are taking insulin.  Make a list of restaurants near you that offer healthy choices. If they have a carry-out menu, take it home and plan what you will order ahead of time.  Look up the restaurant you want to eat at online. Many chain and fast-food restaurants list nutritional information online. Use this information to choose the healthiest options and to calculate how  many carbohydrates will be in your meal.  Use a carbohydrate-counting book or mobile app to look up the carbohydrate content and serving size of the foods you want to eat.  Become familiar with serving sizes and learn to recognize how many servings are in a portion. This will allow you to estimate how many carbohydrates you can eat. FREE FOODS A "free food" is any food or drink that has less than 5 g of carbohydrates per serving. Free foods include:  Many vegetables.  Hard boiled eggs.  Nuts or seeds.  Olives.  Cheeses.  Meats. These types of foods make good appetizer choices and are often available at salad bars. Lemon juice, vinegar, or a low-calorie salad dressing of fewer than 20 calories per serving can be used as a "free" salad dressing.  CHOICES TO REDUCE CARBOHYDRATES  Substitute nonfat sweetened yogurt with a sugar-free yogurt. Yogurt made from soy milk may also be used, but you will still want a sugar-free or plain option to choose a lower carbohydrate amount.  Ask your server to take away the bread basket or chips from your table.  Order fresh fruit. A salad bar often offers  fresh fruit choices. Avoid canned fruit because it is usually packed in sugar or syrup.  Order a salad, and eat it without dressing. Or, create a "free" salad dressing.  Ask for substitutions. For example, instead of [place] fries, request an order of a vegetable such as salad, green beans, or broccoli. OTHER TIPS   If you take insulin, take the insulin once your food arrives to your table. This will ensure your insulin and food are timed correctly.  Ask your server about the portion size before your order, and ask for a take-out box if the portion has more servings than you should have. When your food comes, leave the amount you should have on the plate, and put the rest in the take-out box.  Consider splitting an entree with someone and ordering a side salad.   This information is not intended  to replace advice given to you by your health care provider. Make sure you discuss any questions you have with your health care provider.   Document Released: 09/28/2005 Document Revised: 06/19/2015 Document Reviewed: 12/26/2013 Elsevier Interactive Patient Education 2016 Elsevier Inc.   -  Low-Sodium Eating Plan Sodium raises blood pressure and causes water to be held in the body. Getting less sodium from food will help lower your blood pressure, reduce any swelling, and protect your heart, liver, and kidneys. We get sodium by adding salt (sodium chloride) to food. Most of our sodium comes from canned, boxed, and frozen foods. Restaurant foods, fast foods, and pizza are also very high in sodium. Even if you take medicine to lower your blood pressure or to reduce fluid in your body, getting less sodium from your food is important. WHAT IS MY PLAN? Most people should limit their sodium intake to 2,300 mg a day. Your health care provider recommends that you limit your sodium intake to 2000 mg a day.  WHAT DO I NEED TO KNOW ABOUT THIS EATING PLAN? For the low-sodium eating plan, you will follow these general guidelines:  Choose foods with a % Daily Value for sodium of less than 5% (as listed on the food label).   Use salt-free seasonings or herbs instead of table salt or sea salt.   Check with your health care provider or pharmacist before using salt substitutes.   Eat fresh foods.  Eat more vegetables and fruits.  Limit canned vegetables. If you do use them, rinse them well to decrease the sodium.   Limit cheese to 1 oz (28 g) per day.   Eat lower-sodium products, often labeled as "lower sodium" or "no salt added."  Avoid foods that contain monosodium glutamate (MSG). MSG is sometimes added to [place] food and some canned foods.  Check food labels (Nutrition Facts labels) on foods to learn how much sodium is in one serving.  Eat more home-cooked food and less restaurant,  buffet, and fast food.  When eating at a restaurant, ask that your food be prepared with less salt, or no salt if possible.  HOW DO I READ FOOD LABELS FOR SODIUM INFORMATION? The Nutrition Facts label lists the amount of sodium in one serving of the food. If you eat more than one serving, you must multiply the listed amount of sodium by the number of servings. Food labels may also identify foods as:  Sodium free--Less than 5 mg in a serving.  Very low sodium--35 mg or less in a serving.  Low sodium--140 mg or less in a serving.  Light in sodium--50% less sodium  in a serving. For example, if a food that usually has 300 mg of sodium is changed to become light in sodium, it will have 150 mg of sodium.  Reduced sodium--25% less sodium in a serving. For example, if a food that usually has 400 mg of sodium is changed to reduced sodium, it will have 300 mg of sodium. WHAT FOODS CAN I EAT? Grains Low-sodium cereals, including oats, puffed wheat and rice, and shredded wheat cereals. Low-sodium crackers. Unsalted rice and pasta. Lower-sodium bread.  Vegetables Frozen or fresh vegetables. Low-sodium or reduced-sodium canned vegetables. Low-sodium or reduced-sodium tomato sauce and paste. Low-sodium or reduced-sodium tomato and vegetable juices.  Fruits Fresh, frozen, and canned fruit. Fruit juice.  Meat and Other Protein Products Low-sodium canned tuna and salmon. Fresh or frozen meat, poultry, seafood, and fish. Lamb. Unsalted nuts. Dried beans, peas, and lentils without added salt. Unsalted canned beans. Homemade soups without salt. Eggs.  Dairy Milk. Soy milk. Ricotta cheese. Low-sodium or reduced-sodium cheeses. Yogurt.  Condiments Fresh and dried herbs and spices. Salt-free seasonings. Onion and garlic powders. Low-sodium varieties of mustard and ketchup. Fresh or refrigerated horseradish. Lemon juice.  Fats and Oils Reduced-sodium salad dressings. Unsalted butter.   Other Unsalted popcorn and pretzels.  The items listed above may not be a complete list of recommended foods or beverages. Contact your dietitian for more options. WHAT FOODS ARE NOT RECOMMENDED? Grains Instant hot cereals. Bread stuffing, pancake, and biscuit mixes. Croutons. Seasoned rice or pasta mixes. Noodle soup cups. Boxed or frozen macaroni and cheese. Self-rising flour. Regular salted crackers. Vegetables Regular canned vegetables. Regular canned tomato sauce and paste. Regular tomato and vegetable juices. Frozen vegetables in sauces. Salted [place] fries. Olives. Angie Fava. Relishes. Sauerkraut. Salsa. Meat and Other Protein Products Salted, canned, smoked, spiced, or pickled meats, seafood, or fish. Bacon, ham, sausage, hot dogs, corned beef, chipped beef, and packaged luncheon meats. Salt pork. Jerky. Pickled herring. Anchovies, regular canned tuna, and sardines. Salted nuts. Dairy Processed cheese and cheese spreads. Cheese curds. Blue cheese and cottage cheese. Buttermilk.  Condiments Onion and garlic salt, seasoned salt, table salt, and sea salt. Canned and packaged gravies. Worcestershire sauce. Tartar sauce. Barbecue sauce. Teriyaki sauce. Soy sauce, including reduced sodium. Steak sauce. Fish sauce. Oyster sauce. Cocktail sauce. Horseradish that you find on the shelf. Regular ketchup and mustard. Meat flavorings and tenderizers. Bouillon cubes. Hot sauce. Tabasco sauce. Marinades. Taco seasonings. Relishes. Fats and Oils Regular salad dressings. Salted butter. Margarine. Ghee. Bacon fat.  Other Potato and tortilla chips. Corn chips and puffs. Salted popcorn and pretzels. Canned or dried soups. Pizza. Frozen entrees and pot pies.  The items listed above may not be a complete list of foods and beverages to avoid. Contact your dietitian for more information.   This information is not intended to replace advice given to you by your health care provider. Make sure you  discuss any questions you have with your health care provider.   Document Released: 03/20/2002 Document Revised: 10/19/2014 Document Reviewed: 08/02/2013 Elsevier Interactive Patient Education 2016 Elsevier Inc.  Influenza Virus Vaccine injection (Fluarix) What is this medicine? INFLUENZA VIRUS VACCINE (in floo EN zuh VAHY ruhs vak SEEN) helps to reduce the risk of getting influenza also known as the flu. This medicine may be used for other purposes; ask your health care provider or pharmacist if you have questions. What should I tell my health care provider before I take this medicine? They need to know if you have any of these  conditions: -bleeding disorder like hemophilia -fever or infection -Guillain-Barre syndrome or other neurological problems -immune system problems -infection with the human immunodeficiency virus (HIV) or AIDS -low blood platelet counts -multiple sclerosis -an unusual or allergic reaction to influenza virus vaccine, eggs, chicken proteins, latex, gentamicin, other medicines, foods, dyes or preservatives -pregnant or trying to get pregnant -breast-feeding How should I use this medicine? This vaccine is for injection into a muscle. It is given by a health care professional. A copy of Vaccine Information Statements will be given before each vaccination. Read this sheet carefully each time. The sheet may change frequently. Talk to your pediatrician regarding the use of this medicine in children. Special care may be needed. Overdosage: If you think you have taken too much of this medicine contact a poison control center or emergency room at once. NOTE: This medicine is only for you. Do not share this medicine with others. What if I miss a dose? This does not apply. What may interact with this medicine? -chemotherapy or radiation therapy -medicines that lower your immune system like etanercept, anakinra, infliximab, and adalimumab -medicines that treat or prevent  blood clots like warfarin -phenytoin -steroid medicines like prednisone or cortisone -theophylline -vaccines This list may not describe all possible interactions. Give your health care provider a list of all the medicines, herbs, non-prescription drugs, or dietary supplements you use. Also tell them if you smoke, drink alcohol, or use illegal drugs. Some items may interact with your medicine. What should I watch for while using this medicine? Report any side effects that do not go away within 3 days to your doctor or health care professional. Call your health care provider if any unusual symptoms occur within 6 weeks of receiving this vaccine. You may still catch the flu, but the illness is not usually as bad. You cannot get the flu from the vaccine. The vaccine will not protect against colds or other illnesses that may cause fever. The vaccine is needed every year. What side effects may I notice from receiving this medicine? Side effects that you should report to your doctor or health care professional as soon as possible: -allergic reactions like skin rash, itching or hives, swelling of the face, lips, or tongue Side effects that usually do not require medical attention (report to your doctor or health care professional if they continue or are bothersome): -fever -headache -muscle aches and pains -pain, tenderness, redness, or swelling at site where injected -weak or tired This list may not describe all possible side effects. Call your doctor for medical advice about side effects. You may report side effects to FDA at 1-800-FDA-1088. Where should I keep my medicine? This vaccine is only given in a clinic, pharmacy, doctor's office, or other health care setting and will not be stored at home. NOTE: This sheet is a summary. It may not cover all possible information. If you have questions about this medicine, talk to your doctor, pharmacist, or health care provider.    2016, Elsevier/Gold  Standard. (2008-04-25 09:30:40) Pneumococcal Polysaccharide Vaccine: What You Need to Know 1. Why get vaccinated? Vaccination can protect older adults (and some children and younger adults) from pneumococcal disease. Pneumococcal disease is caused by bacteria that can spread from person to person through close contact. It can cause ear infections, and it can also lead to more serious infections of the:   Lungs (pneumonia),  Blood (bacteremia), and  Covering of the brain and spinal cord (meningitis). Meningitis can cause deafness and brain damage,  and it can be fatal. Anyone can get pneumococcal disease, but children under 77 years of age, people with certain medical conditions, adults over 27 years of age, and cigarette smokers are at the highest risk. About 18,000 older adults die each year from pneumococcal disease in the [place]. Treatment of pneumococcal infections with penicillin and other drugs used to be more effective. But some strains of the disease have become resistant to these drugs. This makes prevention of the disease, through vaccination, even more important. 2. Pneumococcal polysaccharide vaccine (PPSV23) Pneumococcal polysaccharide vaccine (PPSV23) protects against 23 types of pneumococcal bacteria. It will not prevent all pneumococcal disease. PPSV23 is recommended for:  All adults 45 years of age and older,  Anyone 2 through 58 years of age with certain long-term health problems,  Anyone 2 through 58 years of age with a weakened immune system,  Adults 78 through 58 years of age who smoke cigarettes or have asthma. Most people need only one dose of PPSV. A second dose is recommended for certain high-risk groups. People 32 and older should get a dose even if they have gotten one or more doses of the vaccine before they turned 65. Your healthcare provider can give you more information about these recommendations. Most healthy adults develop protection within 2 to 3  weeks of getting the shot. 3. Some people should not get this vaccine  Anyone who has had a life-threatening allergic reaction to PPSV should not get another dose.  Anyone who has a severe allergy to any component of PPSV should not receive it. Tell your provider if you have any severe allergies.  Anyone who is moderately or severely ill when the shot is scheduled may be asked to wait until they recover before getting the vaccine. Someone with a mild illness can usually be vaccinated.  Children less than 30 years of age should not receive this vaccine.  There is no evidence that PPSV is harmful to either a pregnant woman or to her fetus. However, as a precaution, women who need the vaccine should be vaccinated before becoming pregnant, if possible. 4. Risks of a vaccine reaction With any medicine, including vaccines, there is a chance of side effects. These are usually mild and go away on their own, but serious reactions are also possible. About half of people who get PPSV have mild side effects, such as redness or pain where the shot is given, which go away within about two days. Less than 1 out of 100 people develop a fever, muscle aches, or more severe local reactions. Problems that could happen after any vaccine:  People sometimes faint after a medical procedure, including vaccination. Sitting or lying down for about 15 minutes can help prevent fainting, and injuries caused by a fall. Tell your doctor if you feel dizzy, or have vision changes or ringing in the ears.  Some people get severe pain in the shoulder and have difficulty moving the arm where a shot was given. This happens very rarely.  Any medication can cause a severe allergic reaction. Such reactions from a vaccine are very rare, estimated at about 1 in a million doses, and would happen within a few minutes to a few hours after the vaccination. As with any medicine, there is a very remote chance of a vaccine causing a serious  injury or death. The safety of vaccines is always being monitored. For more information, visit: http://www.aguilar.org/ 5. What if there is a serious reaction? What should I look for?  Look for anything that concerns you, such as signs of a severe allergic reaction, very high fever, or unusual behavior.  Signs of a severe allergic reaction can include hives, swelling of the face and throat, difficulty breathing, a fast heartbeat, dizziness, and weakness. These would usually start a few minutes to a few hours after the vaccination. What should I do? If you think it is a severe allergic reaction or other emergency that can't wait, call 9-1-1 or get to the nearest hospital. Otherwise, call your doctor. Afterward, the reaction should be reported to the Vaccine Adverse Event Reporting System (VAERS). Your doctor might file this report, or you can do it yourself through the VAERS web site at www.vaers.SamedayNews.es, or by calling 623-508-9825.  VAERS does not give medical advice. 6. How can I learn more?  Ask your doctor. He or she can give you the vaccine package insert or suggest other sources of information.  Call your local or state health department.  Contact the Centers for Disease Control and Prevention (CDC):  Call 9492875812 (1-800-CDC-INFO) or  Visit CDC's website at http://hunter.com/ CDC Pneumococcal Polysaccharide Vaccine VIS (02/02/14)   This information is not intended to replace advice given to you by your health care provider. Make sure you discuss any questions you have with your health care provider.   Document Released: 07/26/2006 Document Revised: 10/19/2014 Document Reviewed: 02/05/2014 Elsevier Interactive Patient Education [employer].

## 2016-08-11 ENCOUNTER — Other Ambulatory Visit: Payer: Self-pay | Admitting: Internal Medicine

## 2016-08-11 DIAGNOSIS — D649 Anemia, unspecified: Secondary | ICD-10-CM

## 2016-08-11 LAB — MICROALBUMIN / CREATININE URINE RATIO
Creatinine, Urine: 133 mg/dL (ref 20–320)
MICROALB/CREAT RATIO: 750 ug/mg{creat} — AB (ref <30)
Microalb, Ur: 99.7 mg/dL

## 2016-08-12 ENCOUNTER — Telehealth: Payer: Self-pay

## 2016-08-12 NOTE — Telephone Encounter (Signed)
Contacted pt to go over lab results pt is aware of results and is schedule for a lab visit for tomorrow

## 2016-08-13 ENCOUNTER — Other Ambulatory Visit: Payer: Self-pay

## 2016-08-19 ENCOUNTER — Ambulatory Visit: Payer: Self-pay

## 2016-08-25 ENCOUNTER — Ambulatory Visit: Payer: Self-pay | Admitting: Pharmacist

## 2016-08-26 ENCOUNTER — Other Ambulatory Visit: Payer: Self-pay | Admitting: Internal Medicine

## 2016-08-26 DIAGNOSIS — Z794 Long term (current) use of insulin: Principal | ICD-10-CM

## 2016-08-26 DIAGNOSIS — E1165 Type 2 diabetes mellitus with hyperglycemia: Secondary | ICD-10-CM

## 2016-08-26 MED FILL — glipiZIDE 10 MG TABS: 10 | 30 days supply | Qty: 60 | Fill #0

## 2016-09-01 MED FILL — !LANTUS SOLOSTAR 100UNITS/M: 100 | 19 days supply | Qty: 15 | Fill #0

## 2016-09-02 ENCOUNTER — Ambulatory Visit: Payer: Self-pay | Admitting: Pharmacist

## 2016-09-08 ENCOUNTER — Ambulatory Visit: Payer: Self-pay | Attending: Internal Medicine | Admitting: Pharmacist

## 2016-09-08 VITALS — BP 156/87 | HR 98

## 2016-09-08 DIAGNOSIS — M7989 Other specified soft tissue disorders: Secondary | ICD-10-CM | POA: Insufficient documentation

## 2016-09-08 DIAGNOSIS — E1165 Type 2 diabetes mellitus with hyperglycemia: Secondary | ICD-10-CM

## 2016-09-08 DIAGNOSIS — Z79899 Other long term (current) drug therapy: Secondary | ICD-10-CM | POA: Insufficient documentation

## 2016-09-08 DIAGNOSIS — E119 Type 2 diabetes mellitus without complications: Secondary | ICD-10-CM | POA: Insufficient documentation

## 2016-09-08 DIAGNOSIS — Z794 Long term (current) use of insulin: Secondary | ICD-10-CM | POA: Insufficient documentation

## 2016-09-08 LAB — GLUCOSE, POCT (MANUAL RESULT ENTRY): POC Glucose: 170 mg/dl — AB (ref 70–99)

## 2016-09-08 MED ORDER — GLUCOSE BLOOD VI STRP: ORAL_STRIP | 12 refills | Status: DC

## 2016-09-08 MED ORDER — TRUEPLUS LANCETS 28G MISC: 12 refills | Status: DC

## 2016-09-08 MED ORDER — TRUE METRIX METER W/DEVICE KIT: PACK | 0 refills | Status: DC

## 2016-09-08 MED FILL — hydrALAZINE HCL 10 MG TABS: 10 | 30 days supply | Qty: 90 | Fill #1

## 2016-09-08 MED FILL — FENOFIBRATE 145 MG TABLET: 145 | 30 days supply | Qty: 30 | Fill #4

## 2016-09-08 MED FILL — TRUE METRIX BLOOD GLUCOSE M: W/DEVICE | 1 days supply | Qty: 1 | Fill #0

## 2016-09-08 MED FILL — ATORVASTATIN 20 MG TABLET: 20 | 30 days supply | Qty: 15 | Fill #4

## 2016-09-08 MED FILL — TRUE METRIX TEST STRIP: 30 days supply | Qty: 100 | Fill #0

## 2016-09-08 MED FILL — TRUEplus LANCETS 28G MISC: 30 days supply | Qty: 100 | Fill #0

## 2016-09-08 NOTE — Patient Instructions (Addendum)
Thanks for coming to see [place]  I ordered you a new meter - continue getting your blood sugar and see if we get any different readings based on the new meter.  Make an appointment with Dr. Janne Napoleon for the leg swelling.

## 2016-09-08 NOTE — Progress Notes (Signed)
    S:    Patient arrives in good spirits.  Presents for diabetes evaluation, education, and management at the request of Dr. Janne Napoleon. Patient was referred on 08/10/16.  Patient was last seen by Primary Care Provider on 08/10/16.   Patient reports adherence with medications.  Current diabetes medications include: glipizide 10 mg BID, Novolog 15 units TID, Lantus 40 units BID  Patient denies hypoglycemic events but does have relative hypoglycemia where she will get shaky and sweaty with readings in the 90s and low 100s.  Patient reported dietary habits: hasn't been eating much recently due to nausea. She has had nausea since Thanksgiving and has been drinking regular ginger ale to help.  Patient reported exercise habits: none   Patient reports nocturia 2 x per night.  Patient reports neuropathy. Patient denies visual changes. Patient reports self foot exams.    O:  Lab Results  Component Value Date   HGBA1C 13.7 08/10/2016   Vitals:   09/08/16 1342  BP: (!) 156/87  Pulse: 98    Home fasting CBG: 130s-160s 2 hour post-prandial/random CBG: 190-300 (most <200).  POCT = 170 (post-prandial)   A/P: Diabetes longstandingcurrently UNcontrolled based on A1c of 13.7 but improving based on home CBGs. Patient denies hypoglycemic events and is able to verbalize appropriate hypoglycemia management plan. Patient reports adherence with medication. Control is suboptimal due to dietary indiscretion and sedentary lifestyle.   Her blood sugar readings were about always the same each day to the point that we are not sure that her blood glucose meter is accurate. Will not make any changes until she has a new meter and we have accurate readings. Patient to pick up new meter and monitor her CBGs and then bring back her meter to her next visit.  Patient reports lower extremity swelling after starting hydralazine and believes this medication started it. Hydralazine can cause peripheral edema. Asked  that she hold this medication and see if the lower extremity edema resolves. If not, patient to follow up with Dr. Janne Napoleon next week for further evaluation. Amlodipine can also cause lower extremity edema so could consider a trial off if the discontinuation of hydralazine doesn't stop it.   Next A1C anticipated January 2018.  Written patient instructions provided.  Total time in face to face counseling 30 minutes.   Follow up in Pharmacist Clinic Visit in 1 week for blood pressure follow up or if able to get in with Dr. Janne Napoleon - patient to see her.   Patient seen with Biagio Borg, PharmD Candidate

## 2016-09-10 MED FILL — ?AMLODIPINE BESYLATE 5 MG T: 5 | 30 days supply | Qty: 60 | Fill #3

## 2016-09-10 MED FILL — ?ESCITALPRAM 10 MG TABLET: 10 MG | 30 days supply | Qty: 30 | Fill #1

## 2016-09-15 ENCOUNTER — Ambulatory Visit: Payer: Self-pay | Admitting: Pharmacist

## 2016-09-17 ENCOUNTER — Encounter: Payer: Self-pay | Admitting: Pharmacist

## 2016-09-17 ENCOUNTER — Ambulatory Visit: Payer: Self-pay | Attending: Internal Medicine | Admitting: Pharmacist

## 2016-09-17 VITALS — BP 162/98 | HR 92

## 2016-09-17 DIAGNOSIS — I10 Essential (primary) hypertension: Secondary | ICD-10-CM | POA: Insufficient documentation

## 2016-09-17 DIAGNOSIS — Z794 Long term (current) use of insulin: Secondary | ICD-10-CM | POA: Insufficient documentation

## 2016-09-17 DIAGNOSIS — E119 Type 2 diabetes mellitus without complications: Secondary | ICD-10-CM | POA: Insufficient documentation

## 2016-09-17 DIAGNOSIS — E1165 Type 2 diabetes mellitus with hyperglycemia: Secondary | ICD-10-CM

## 2016-09-17 NOTE — Patient Instructions (Signed)
Thanks for coming to see me!  Make sure to only take the Novolog if you are going to eat a meal and to take it 15 minutes before you eat (or right before you eat) but don't take it and not eat.  Schedule an appointment with Dr. Janne Napoleon for blood pressure  DASH Eating Plan DASH stands for "Dietary Approaches to Stop Hypertension." The DASH eating plan is a healthy eating plan that has been shown to reduce high blood pressure (hypertension). Additional health benefits may include reducing the risk of type 2 diabetes mellitus, heart disease, and stroke. The DASH eating plan may also help with weight loss. What do I need to know about the DASH eating plan? For the DASH eating plan, you will follow these general guidelines:  Choose foods with less than 150 milligrams of sodium per serving (as listed on the food label).  Use salt-free seasonings or herbs instead of table salt or sea salt.  Check with your health care provider or pharmacist before using salt substitutes.  Eat lower-sodium products. These are often labeled as "low-sodium" or "no salt added."  Eat fresh foods. Avoid eating a lot of canned foods.  Eat more vegetables, fruits, and low-fat dairy products.  Choose whole grains. Look for the word "whole" as the first word in the ingredient list.  Choose fish and skinless chicken or [place] more often than red meat. Limit fish, poultry, and meat to 6 oz (170 g) each day.  Limit sweets, desserts, sugars, and sugary drinks.  Choose heart-healthy fats.  Eat more home-cooked food and less restaurant, buffet, and fast food.  Limit fried foods.  Do not fry foods. Cook foods using methods such as baking, boiling, grilling, and broiling instead.  When eating at a restaurant, ask that your food be prepared with less salt, or no salt if possible. What foods can I eat? Seek help from a dietitian for individual calorie needs. Grains  Whole grain or whole wheat bread. Brown rice. Whole  grain or whole wheat pasta. Quinoa, bulgur, and whole grain cereals. Low-sodium cereals. Corn or whole wheat flour tortillas. Whole grain cornbread. Whole grain crackers. Low-sodium crackers. Vegetables  Fresh or frozen vegetables (raw, steamed, roasted, or grilled). Low-sodium or reduced-sodium tomato and vegetable juices. Low-sodium or reduced-sodium tomato sauce and paste. Low-sodium or reduced-sodium canned vegetables. Fruits  All fresh, canned (in natural juice), or frozen fruits. Meat and Other Protein Products  Ground beef (85% or leaner), grass-fed beef, or beef trimmed of fat. Skinless chicken or [place]. Ground chicken or [place]. Pork trimmed of fat. All fish and seafood. Eggs. Dried beans, peas, or lentils. Unsalted nuts and seeds. Unsalted canned beans. Dairy  Low-fat dairy products, such as skim or 1% milk, 2% or reduced-fat cheeses, low-fat ricotta or cottage cheese, or plain low-fat yogurt. Low-sodium or reduced-sodium cheeses. Fats and Oils  Tub margarines without trans fats. Light or reduced-fat mayonnaise and salad dressings (reduced sodium). Avocado. Safflower, olive, or canola oils. Natural peanut or almond butter. Other  Unsalted popcorn and pretzels. The items listed above may not be a complete list of recommended foods or beverages. Contact your dietitian for more options.  What foods are not recommended? Grains  White bread. White pasta. White rice. Refined cornbread. Bagels and croissants. Crackers that contain trans fat. Vegetables  Creamed or fried vegetables. Vegetables in a cheese sauce. Regular canned vegetables. Regular canned tomato sauce and paste. Regular tomato and vegetable juices. Fruits  Canned fruit in light or heavy  syrup. Fruit juice. Meat and Other Protein Products  Fatty cuts of meat. Ribs, chicken wings, bacon, sausage, bologna, salami, chitterlings, fatback, hot dogs, bratwurst, and packaged luncheon meats. Salted nuts and seeds. Canned beans with  salt. Dairy  Whole or 2% milk, cream, half-and-half, and cream cheese. Whole-fat or sweetened yogurt. Full-fat cheeses or blue cheese. Nondairy creamers and whipped toppings. Processed cheese, cheese spreads, or cheese curds. Condiments  Onion and garlic salt, seasoned salt, table salt, and sea salt. Canned and packaged gravies. Worcestershire sauce. Tartar sauce. Barbecue sauce. Teriyaki sauce. Soy sauce, including reduced sodium. Steak sauce. Fish sauce. Oyster sauce. Cocktail sauce. Horseradish. Ketchup and mustard. Meat flavorings and tenderizers. Bouillon cubes. Hot sauce. Tabasco sauce. Marinades. Taco seasonings. Relishes. Fats and Oils  Butter, stick margarine, lard, shortening, ghee, and bacon fat. Coconut, palm kernel, or palm oils. Regular salad dressings. Other  Pickles and olives. Salted popcorn and pretzels. The items listed above may not be a complete list of foods and beverages to avoid. Contact your dietitian for more information.  Where can I find more information? National Heart, Lung, and Blood Institute: travelstabloid.com This information is not intended to replace advice given to you by your health care provider. Make sure you discuss any questions you have with your health care provider. Document Released: 09/17/2011 Document Revised: 03/05/2016 Document Reviewed: 08/02/2013 Elsevier Interactive Patient Education  2017 [employer].

## 2016-09-17 NOTE — Progress Notes (Signed)
    S:    Patient arrives in good spirits.  Presents for diabetes evaluation, education, and management at the request of Dr. Janne Napoleon. Patient was referred on 08/10/16.  Patient was last seen by Primary Care Provider on 08/10/16.   Patient reports adherence with medications.   Current diabetes medications include: glipizide 10 mg BID, Novolog 15 units TID, Lantus 40 units BID. Of note, patient will take the Novolog whether she eats immediately after or not.   Current hypertension medications include: amlodipine 10 mg daily (5 mg BID), and hydralazine which has been on hold due to lower extremity edema  The lower extremity edema has resolved since stopping the hydralazine.  Patient denies hypoglycemic events but does have relative hypoglycemia where she will get shaky and sweaty with readings in the 90s and low 100s.  Patient reported dietary habits: she has been eating more after her stomach bug has resolved.  Patient reported exercise habits: none   Patient reports nocturia 2 x per night.  Patient reports neuropathy. Patient denies visual changes. Patient reports self foot exams.    O:  Lab Results  Component Value Date   HGBA1C 13.7 08/10/2016   There were no vitals filed for this visit.  Home fasting CBG: 130s-160s 2 hour post-prandial/random CBG: 190-300 (most <200).  POCT = 170 (post-prandial)   A/P: Diabetes longstandingcurrently UNcontrolled based on A1c of 13.7 but improving based on home CBGs. Patient denies true hypoglycemic events and is able to verbalize appropriate hypoglycemia management plan. Patient reports adherence with medication. Control is suboptimal due to dietary indiscretion and sedentary lifestyle.   Continue current medications as prescribed. Instructed patient to only take Novolog if she is going to eat immediately after. If she doesn't eat, she should skip it. I think this has lead to her relative hypoglycemia as well as higher post-prandials  since she takes the Novolog well before she eats (hour or so).  I would like to add on something for her blood pressure but anything that could be added would require that she return next week for lab work and follow up and there are no appointments with me. I have instructed her to make an appt with Dr. Janne Napoleon. Would consider addition of ACE inhibitor with close follow up of renal function and potassium due to CKD. If patient is unable to get in with Dr. Janne Napoleon, she will turn to see in 2 weeks. Provided patient on information on DASH diet and to avoid salt.   Next A1C anticipated January 2018.  Written patient instructions provided.  Total time in face to face counseling 30 minutes.   Follow up in Pharmacist Clinic Visit in 2 weeks if unable to get in to see Dr. Janne Napoleon.

## 2016-09-29 ENCOUNTER — Encounter: Payer: Self-pay | Admitting: Internal Medicine

## 2016-09-29 ENCOUNTER — Ambulatory Visit: Payer: Self-pay | Attending: Internal Medicine | Admitting: Internal Medicine

## 2016-09-29 VITALS — BP 164/80 | HR 100 | Temp 98.2°F | Resp 16 | Wt 173.4 lb

## 2016-09-29 DIAGNOSIS — I129 Hypertensive chronic kidney disease with stage 1 through stage 4 chronic kidney disease, or unspecified chronic kidney disease: Secondary | ICD-10-CM | POA: Insufficient documentation

## 2016-09-29 DIAGNOSIS — R600 Localized edema: Secondary | ICD-10-CM | POA: Insufficient documentation

## 2016-09-29 DIAGNOSIS — Z7982 Long term (current) use of aspirin: Secondary | ICD-10-CM | POA: Insufficient documentation

## 2016-09-29 DIAGNOSIS — E1165 Type 2 diabetes mellitus with hyperglycemia: Secondary | ICD-10-CM

## 2016-09-29 DIAGNOSIS — Z114 Encounter for screening for human immunodeficiency virus [HIV]: Secondary | ICD-10-CM

## 2016-09-29 DIAGNOSIS — E785 Hyperlipidemia, unspecified: Secondary | ICD-10-CM

## 2016-09-29 DIAGNOSIS — N183 Chronic kidney disease, stage 3 unspecified: Secondary | ICD-10-CM

## 2016-09-29 DIAGNOSIS — F32A Depression, unspecified: Secondary | ICD-10-CM

## 2016-09-29 DIAGNOSIS — E1122 Type 2 diabetes mellitus with diabetic chronic kidney disease: Secondary | ICD-10-CM | POA: Insufficient documentation

## 2016-09-29 DIAGNOSIS — Z794 Long term (current) use of insulin: Secondary | ICD-10-CM | POA: Insufficient documentation

## 2016-09-29 DIAGNOSIS — F419 Anxiety disorder, unspecified: Secondary | ICD-10-CM

## 2016-09-29 DIAGNOSIS — Z888 Allergy status to other drugs, medicaments and biological substances status: Secondary | ICD-10-CM | POA: Insufficient documentation

## 2016-09-29 DIAGNOSIS — I10 Essential (primary) hypertension: Secondary | ICD-10-CM

## 2016-09-29 DIAGNOSIS — E669 Obesity, unspecified: Secondary | ICD-10-CM | POA: Insufficient documentation

## 2016-09-29 DIAGNOSIS — F329 Major depressive disorder, single episode, unspecified: Secondary | ICD-10-CM | POA: Insufficient documentation

## 2016-09-29 DIAGNOSIS — F418 Other specified anxiety disorders: Secondary | ICD-10-CM

## 2016-09-29 LAB — GLUCOSE, POCT (MANUAL RESULT ENTRY): POC GLUCOSE: 136 mg/dL — AB (ref 70–99)

## 2016-09-29 MED ORDER — LISINOPRIL 5 MG PO TABS: 5.0000 mg | ORAL_TABLET | Freq: Every day | ORAL | 3 refills | Status: DC

## 2016-09-29 MED ORDER — GABAPENTIN 100 MG PO CAPS: 100.0000 mg | ORAL_CAPSULE | Freq: Every day | ORAL | 3 refills | Status: DC

## 2016-09-29 MED ORDER — INSULIN ASPART 100 UNIT/ML ~~LOC~~ SOLN: SUBCUTANEOUS | 3 refills | Status: DC

## 2016-09-29 MED ORDER — GLIPIZIDE 10 MG PO TABS: ORAL_TABLET | ORAL | 3 refills | Status: DC

## 2016-09-29 MED ORDER — INSULIN GLARGINE 100 UNIT/ML SOLOSTAR PEN: 40.0000 [IU] | PEN_INJECTOR | Freq: Two times a day (BID) | SUBCUTANEOUS | 9 refills | Status: DC

## 2016-09-29 MED ORDER — AMLODIPINE BESYLATE 5 MG PO TABS: 5.0000 mg | ORAL_TABLET | Freq: Two times a day (BID) | ORAL | 3 refills | Status: DC

## 2016-09-29 MED ORDER — ESCITALOPRAM OXALATE 10 MG PO TABS: 10.0000 mg | ORAL_TABLET | Freq: Every evening | ORAL | 3 refills | Status: DC | PRN

## 2016-09-29 MED ORDER — ASPIRIN EC 81 MG PO TBEC: 81.0000 mg | DELAYED_RELEASE_TABLET | Freq: Every day | ORAL | 3 refills | Status: DC

## 2016-09-29 MED ORDER — ATORVASTATIN CALCIUM 20 MG PO TABS: 10.0000 mg | ORAL_TABLET | Freq: Every day | ORAL | 3 refills | Status: DC

## 2016-09-29 MED FILL — ATORVASTATIN 10 MG TABLET: 10 | 30 days supply | Qty: 30 | Fill #0

## 2016-09-29 MED FILL — GABAPENTIN 100 MG CAPSULE: 100 | 30 days supply | Qty: 30 | Fill #0

## 2016-09-29 MED FILL — !LANTUS SOLOSTAR 100UNITS/M: 100 | 19 days supply | Qty: 15 | Fill #0

## 2016-09-29 MED FILL — !NOVOLOG 100UNITS/ML VIAL: 100/ML | 33 days supply | Qty: 10 | Fill #0

## 2016-09-29 MED FILL — glipiZIDE 10 MG TABS: 10 | 30 days supply | Qty: 60 | Fill #0

## 2016-09-29 MED FILL — LISINOPRIL 5 MG TABLET: 5 | 30 days supply | Qty: 30 | Fill #0

## 2016-09-29 NOTE — Progress Notes (Addendum)
Nazia Rhines, is a 60 y.o. female  GYB:[id number]  AJG:[id number]  DOB - [birth year]  Chief Complaint  Patient presents with  . Hypertension  . URI        Subjective:   Haliyah Fryman is a 60 y.o. female here today for a follow up visit.  Last seen 10/30, w/ recent visit w/ [facility] clinical pharmacist as well for dm chks.  Per pt, has been tired last few days, sugar last night 90 around mn and she ate some peanut butter crackers. This am, her cbg was about 83.  She is taking novolog 10 units w/ meals, not injecting novolog if not eating, and still on lantus 40 bid.  She is trying to watch her diet much better.  Admits to some salty foods last night.  After stopping the hydralazine, her lower extremity swelling has resolved.  Does not smoke or drink etoh.  Patient has No headache, No chest pain, No abdominal pain - No Nausea, No new weakness tingling or numbness, No Cough - SOB.  No problems updated.  ALLERGIES: Allergies  Allergen Reactions  . Codone [Hydrocodone] Itching  . Hydralazine Swelling    legs  . Sulfa Antibiotics Itching and Rash    PAST MEDICAL HISTORY: Past Medical History:  Diagnosis Date  . Diabetes mellitus without complication ([place])   . Hypertension   . Obesity     MEDICATIONS AT HOME: Prior to Admission medications   Medication Sig Start Date End Date Taking? Authorizing Provider  amLODipine (NORVASC) 5 MG tablet Take 1 tablet (5 mg total) by mouth 2 (two) times daily. 09/29/16   Maren Reamer, MD  aspirin EC 81 MG tablet Take 1 tablet (81 mg total) by mouth daily. 09/29/16   Maren Reamer, MD  atorvastatin (LIPITOR) 20 MG tablet Take 0.5 tablets (10 mg total) by mouth daily. 09/29/16   Maren Reamer, MD  Blood Glucose Monitoring Suppl (TRUE METRIX METER) w/Device KIT Use as directed 09/08/16   Maren Reamer, MD  cyclobenzaprine (FLEXERIL) 10 MG tablet Take 1 tablet (10 mg total) by mouth 3 (three) times daily. Patient not taking:  Reported on 09/08/2016 03/06/16   Charlann Lange, PA-C  escitalopram (LEXAPRO) 10 MG tablet Take 1 tablet (10 mg total) by mouth at bedtime as needed (anxiety/ insomnia). 09/29/16   Maren Reamer, MD  gabapentin (NEURONTIN) 100 MG capsule Take 1 capsule (100 mg total) by mouth at bedtime. 09/29/16   Maren Reamer, MD  glipiZIDE (GLUCOTROL) 10 MG tablet TAKE 1 TABLET BY MOUTH 2 TIMES DAILY BEFORE A MEAL. 09/29/16   Maren Reamer, MD  glucose blood (TRUE METRIX BLOOD GLUCOSE TEST) test strip Use as instructed 09/08/16   Maren Reamer, MD  insulin aspart (NOVOLOG) 100 UNIT/ML injection 10 units before meals; Unless eating small meal, Inject 8 units prior.  Do not inject if not eating. 09/29/16   Maren Reamer, MD  Insulin Glargine (LANTUS SOLOSTAR) 100 UNIT/ML Solostar Pen Inject 40 Units into the skin 2 (two) times daily. 09/29/16   Maren Reamer, MD  Insulin Pen Needle 29G X 12.7MM MISC 10 Units by Does not apply route 1 day or 1 dose. 01/28/15   Lance Bosch, NP  Insulin Syringe-Needle U-100 30G X 5/16" 1 ML MISC Test 3 times daily 05/29/15   Tresa Garter, MD  lisinopril (PRINIVIL,ZESTRIL) 5 MG tablet Take 1 tablet (5 mg total) by mouth daily. 09/29/16   Maren Reamer,  MD  naproxen (NAPROSYN) 500 MG tablet Take 1 tablet (500 mg total) by mouth 2 (two) times daily. Patient not taking: Reported on 09/08/2016 03/06/16   Charlann Lange, PA-C  TRUEPLUS LANCETS 28G MISC Use as directed 09/08/16   Maren Reamer, MD     Objective:   Vitals:   09/29/16 1021  BP: (!) 164/80  Pulse: 100  Resp: 16  Temp: 98.2 F (36.8 C)  TempSrc: Oral  SpO2: 99%  Weight: 173 lb 6.4 oz (78.7 kg)    Exam General appearance : Awake, alert, not in any distress. Speech Clear. Not toxic looking, pleasant. HEENT: Atraumatic and Normocephalic, pupils equally reactive to light. Neck: supple, no JVD.  Chest:Good air entry bilaterally, no added sounds. CVS: S1 S2 regular, no murmurs/gallups or  rubs. Abdomen: Bowel sounds active, Non tender and not distended with no gaurding, rigidity or rebound. Extremities: B/L Lower Ext shows no edema, both legs are warm to touch Neurology: Awake alert, and oriented X 3, CN II-XII grossly intact, Non focal Skin:No Rash  Data Review Lab Results  Component Value Date   HGBA1C 13.7 08/10/2016   HGBA1C 14.4 04/28/2016   HGBA1C 14.50 11/20/2015    Depression screen PHQ 2/9 09/29/2016 08/10/2016 04/28/2016 11/06/2015 08/15/2015  Decreased Interest 2 1 3 2 2   Down, Depressed, Hopeless 2 2 3 2 2   PHQ - 2 Score 4 3 6 4 4   Altered sleeping 3 1 2 1 2   Tired, decreased energy 2 3 3 3 2   Change in appetite 2 1 2 2 2   Feeling bad or failure about yourself  3 2 3 2 2   Trouble concentrating 2 1 2 2  0  Moving slowly or fidgety/restless 1 1 1 1  0  Suicidal thoughts 0 0 0 0 0  PHQ-9 Score 17 12 19 15 12   Difficult doing work/chores - - Very difficult Somewhat difficult -      Assessment & Plan   1. Htn, uncontrolled Goal sbp <130/80, could not tol hydralazine - continue norvasc 5bid - added lisinopril 5qday - chk bmp, watch renal function closely - f/u w/ RN 2 wks for  bp chk. If renal function stable and sbp <130, no changes.  If renal function stable (ckd 3), and sbp >130, increase lisinopril to 72m qd and make appt to  f/u w/ me in 1 month.  2. Type 2 diabetes mellitus with hyperglycemia, with long-term current use of insulin (HCC) - same insulin regimen today, pt understands what to do for hypoglycemia. - POCT glucose (manual entry) - glipiZIDE (GLUCOTROL) 10 MG tablet; TAKE 1 TABLET BY MOUTH 2 TIMES DAILY BEFORE A MEAL.  Dispense: 180 tablet; Refill: 3 - Ambulatory referral to Ophthalmology  - has not seen in 2 years.  3. CKD (chronic kidney disease) stage 3, GFR 30-59 ml/min Starting lisinopril 5 mg qd today, watch renal function closely. - BASIC METABOLIC PANEL WITH GFR - BASIC METABOLIC PANEL WITH GFR; Future  - 2 wks - renal cs if  ckd 4 per labs.   4. Dyslipidemia - review of lipids 08/10/16 show no more hypertriglycermia, so will dc tricor since could be contributing to fatigue as well. - atorvastatin (LIPITOR) 20 MG tablet; Take 0.5 tablets (10 mg total) by mouth daily.  Dispense: 90 tablet; Refill: 3  5. Encounter for screening for HIV - HIV antibody (with reflex)  6. Up to date on vaccinations.  7. Hydralazine allergy w/ edema, lower extremities. Resolved since stopping. Has added  it to her allergy list. Patient have been counseled extensively about nutrition and exercise  8. Anxiety and depression - escitalopram (LEXAPRO) 10 MG tablet; Take 1 tablet (10 mg total) by mouth at bedtime as needed (anxiety/ insomnia).  Dispense: 30 tablet; Refill: 3 - denies si/hi/avh    Return in about 2 months (around 11/30/2016).  The patient was given clear instructions to go to ER or return to medical center if symptoms don't improve, worsen or new problems develop. The patient verbalized understanding. The patient was told to call to get lab results if they haven't heard anything in the next week.   This note has been created with Surveyor, quantity. Any transcriptional errors are unintentional.   Maren Reamer, MD, [place] [facility] [place], [place]   09/29/2016, 11:04 AM   09/30/16 9am addendum - renal function worse, stage 4 per labs. Dc lisinopril, will start pt on hydralazine 25tid, rn bp chk in 1 wk, and renal cs placed   09/30/16 1258 pm, CMA called, pt apparently allergic to hydralazine, swelling of legs   in past, I did not see on allergy list and added today.  Hr 100 during last visit. Dc hydralazine, continue norvasc bid, and add metroprolol 25 bid.    On RN visit - if sbp >130,  And HR >55, increase metoprolol to 50bid.  Will dw w/ RN

## 2016-09-29 NOTE — Progress Notes (Signed)
136

## 2016-09-30 ENCOUNTER — Other Ambulatory Visit: Payer: Self-pay | Admitting: Internal Medicine

## 2016-09-30 DIAGNOSIS — E1122 Type 2 diabetes mellitus with diabetic chronic kidney disease: Secondary | ICD-10-CM

## 2016-09-30 DIAGNOSIS — N184 Chronic kidney disease, stage 4 (severe): Principal | ICD-10-CM

## 2016-09-30 LAB — BASIC METABOLIC PANEL WITH GFR
BUN: 28 mg/dL — ABNORMAL HIGH (ref 7–25)
CO2: 24 mmol/L (ref 20–31)
Calcium: 8.7 mg/dL (ref 8.6–10.4)
Chloride: 107 mmol/L (ref 98–110)
Creat: 2.18 mg/dL — ABNORMAL HIGH (ref 0.50–1.05)
GFR, EST AFRICAN AMERICAN: 28 mL/min — AB (ref >=60)
GFR, EST NON AFRICAN AMERICAN: 24 mL/min — AB (ref >=60)
Glucose, Bld: 82 mg/dL (ref 65–99)
POTASSIUM: 4.1 mmol/L (ref 3.5–5.3)
SODIUM: 141 mmol/L (ref 135–146)

## 2016-09-30 LAB — HIV ANTIBODY (ROUTINE TESTING W REFLEX): HIV: NONREACTIVE

## 2016-09-30 MED ORDER — METOPROLOL TARTRATE 25 MG PO TABS: 25.0000 mg | ORAL_TABLET | Freq: Two times a day (BID) | ORAL | 3 refills | Status: DC

## 2016-09-30 MED ORDER — HYDRALAZINE HCL 25 MG PO TABS: 25.0000 mg | ORAL_TABLET | Freq: Three times a day (TID) | ORAL | 2 refills | Status: DC

## 2016-09-30 MED FILL — METOPROLOL TARTRATE 25 MG T: 25 | 30 days supply | Qty: 60 | Fill #0

## 2016-09-30 MED FILL — hydrALAZINE HCL 25 MG TABS: 25 | 30 days supply | Qty: 90 | Fill #0

## 2016-09-30 NOTE — Addendum Note (Signed)
Addended by: Lottie Mussel T on: 09/30/2016 09:07 AM   Modules accepted: Orders

## 2016-09-30 NOTE — Addendum Note (Signed)
Addended byLottie Mussel T on: 09/30/2016 01:02 PM   Modules accepted: Orders

## 2016-10-09 ENCOUNTER — Ambulatory Visit: Payer: Self-pay | Attending: Internal Medicine | Admitting: *Deleted

## 2016-10-09 VITALS — BP 167/84 | HR 77 | Temp 97.9°F | Resp 16

## 2016-10-09 DIAGNOSIS — N390 Urinary tract infection, site not specified: Secondary | ICD-10-CM | POA: Insufficient documentation

## 2016-10-09 DIAGNOSIS — R350 Frequency of micturition: Secondary | ICD-10-CM | POA: Insufficient documentation

## 2016-10-09 DIAGNOSIS — R319 Hematuria, unspecified: Secondary | ICD-10-CM | POA: Insufficient documentation

## 2016-10-09 LAB — POCT URINALYSIS DIPSTICK
BILIRUBIN UA: 1
GLUCOSE UA: 250
Ketones, UA: NEGATIVE
Leukocytes, UA: NEGATIVE
Nitrite, UA: POSITIVE
Protein, UA: >300
SPEC GRAV UA: 1.02
UROBILINOGEN UA: 1
pH: 5

## 2016-10-09 MED ORDER — CEPHALEXIN 500 MG PO CAPS: 500.0000 mg | ORAL_CAPSULE | Freq: Two times a day (BID) | ORAL | 0 refills | Status: AC

## 2016-10-09 MED FILL — CEPHALEXIN 500 MG CAPSULE: 500 | 7 days supply | Qty: 14 | Fill #0

## 2016-10-09 NOTE — Progress Notes (Signed)
Pt presents with urinary urgency, states not flowing well, back ache, pain in stomach. Sx's onset Saturday. Tried OTC medication but does not help.

## 2016-10-11 LAB — URINE CULTURE: ORGANISM ID, BACTERIA: NO GROWTH

## 2016-10-13 ENCOUNTER — Telehealth: Payer: Self-pay | Admitting: *Deleted

## 2016-10-13 ENCOUNTER — Ambulatory Visit: Payer: Self-pay | Admitting: *Deleted

## 2016-10-13 ENCOUNTER — Ambulatory Visit: Payer: Self-pay | Attending: Internal Medicine

## 2016-10-13 VITALS — BP 169/82 | HR 80 | Resp 20

## 2016-10-13 DIAGNOSIS — I10 Essential (primary) hypertension: Secondary | ICD-10-CM

## 2016-10-13 DIAGNOSIS — N183 Chronic kidney disease, stage 3 unspecified: Secondary | ICD-10-CM

## 2016-10-13 DIAGNOSIS — D649 Anemia, unspecified: Secondary | ICD-10-CM

## 2016-10-13 MED FILL — AMLODIPINE BESYLATE 5 MG TA: 5 | 30 days supply | Qty: 60 | Fill #0

## 2016-10-13 NOTE — Telephone Encounter (Signed)
Can we get her to come in for bp chk tomorw? W/ you? thx

## 2016-10-13 NOTE — Patient Instructions (Addendum)
Disregard my last note. I actually already started Her on hydralazine 25tid now, stopped the lisinopril - please tell her dont pick up and take. Renal c/s was placed, and fu w/ Stacia Feazell bp chk in 1-2 wks. If sbp >130 still w/ new meds, up hydralazine to 50tid. Thanks   Goal sbp <130/80, could not tolerate hydralazine - continue norvasc 5bid - chk bmp, watch renal function closely - f/u w/ RN 2 wks for  bp chk. If renal function stable and sbp <130, no changes.  If renal function stable (ckd 3), and sbp >130, increase lisinopril to 10mg  qd and make appt to  f/u w/ me in 1 month.     09/30/16 1258 pm, RN called, pt apparently allergic to hydralazine, swelling of legs  in past, I did not see on allergy list and added today. Hr 100 during last visit. Dc hydralazine, continue norvasc bid, and add metroprolol 25 bid.      On RN visit - if sbp >130, And HR >55, increase metoprolol to 50bid.   FAST acronym : Facial Drooping                  Arms and hand tingling        Slurred Speech       Time to call 911

## 2016-10-13 NOTE — Telephone Encounter (Signed)
Pt aware may come in tomorrow when she gets off work for BP check.

## 2016-10-13 NOTE — Progress Notes (Signed)
Pt arrived for BP check with nurse. SBP not at goal, greater than 130. Pt informed writer she is still taking Lisinopril along with other medication: Metoprolol and Amlodipine. She stated she did not get the message to discontinue medication.She denies chest pain, blurred vision, SOB or extremity swelling.  MD informed and clarification with MD about medication regimen addressed. Correct dosage of medications and directions for Metoprolol and Norvasc were explained  printed on the AVS. Pt aware and  verbalized understanding. Pt encourage to get BP machine to check blood pressures at home. She was informed she can return next week for BP check upon returning for appointment with financial counselor.  Pt educated on s/s of Stroke via [place].S.T acronym and instructed to go to ED for chest pain.  Carilyn Goodpasture, RN

## 2016-10-13 NOTE — Telephone Encounter (Signed)
States she had an emesis episode after taking Metoprolol 50 mg as prescribed along with  Other medication that she takes on a daily bases. Only new medication she takes is Keflex. Has been on this particular medication since Friday but has not had any GI problems. She states since taking two of the metoprolol, she is isn't able to do anything, feels weak and light headed. CBG is 122 taken while on the phone with the writer. Unable to assess BP, does not have a BP machine.  pls advise Carilyn Goodpasture, RN

## 2016-10-13 NOTE — Progress Notes (Signed)
Patient here for lab visit only 

## 2016-10-14 ENCOUNTER — Telehealth: Payer: Self-pay | Admitting: *Deleted

## 2016-10-14 ENCOUNTER — Ambulatory Visit: Payer: Self-pay | Attending: Internal Medicine

## 2016-10-14 ENCOUNTER — Ambulatory Visit: Payer: Self-pay

## 2016-10-14 DIAGNOSIS — I10 Essential (primary) hypertension: Secondary | ICD-10-CM

## 2016-10-14 LAB — BASIC METABOLIC PANEL WITH GFR
BUN: 37 mg/dL — ABNORMAL HIGH (ref 7–25)
CALCIUM: 8.6 mg/dL (ref 8.6–10.4)
CO2: 24 mmol/L (ref 20–31)
CREATININE: 1.92 mg/dL — AB (ref 0.50–1.05)
Chloride: 108 mmol/L (ref 98–110)
GFR, Est African American: 33 mL/min — ABNORMAL LOW (ref >=60)
GFR, Est Non African American: 28 mL/min — ABNORMAL LOW (ref >=60)
Glucose, Bld: 238 mg/dL — ABNORMAL HIGH (ref 65–99)
Potassium: 4.1 mmol/L (ref 3.5–5.3)
SODIUM: 139 mmol/L (ref 135–146)

## 2016-10-14 LAB — IRON,TIBC AND FERRITIN PANEL
%SAT: 13 % (ref 11–50)
FERRITIN: 28 ng/mL (ref 10–232)
IRON: 52 ug/dL (ref 45–160)
TIBC: 405 ug/dL (ref 250–450)

## 2016-10-14 LAB — FOLATE: Folate: 11.3 ng/mL (ref >5.4)

## 2016-10-14 LAB — VITAMIN B12: Vitamin B-12: 307 pg/mL (ref 200–1100)

## 2016-10-14 MED ORDER — METOPROLOL TARTRATE 25 MG PO TABS: 25.0000 mg | ORAL_TABLET | Freq: Three times a day (TID) | ORAL | 3 refills | Status: DC

## 2016-10-14 NOTE — Telephone Encounter (Signed)
Pt arrived to [facility] for Blood pressure check having taken blood pressure medication today. She states she feels better than yesterday. BP: 161/83 P:81. Dr. Janne Napoleon notified and verbal order given to instruct patient to take Metoprolol 25 mg t.i.d. Pt has f/u appointment next. BP will be assessed at [facility]. During BP check, pt informed of lab results and plan. Carilyn Goodpasture, RN

## 2016-10-19 ENCOUNTER — Ambulatory Visit: Payer: Self-pay | Attending: Internal Medicine

## 2016-10-21 ENCOUNTER — Encounter: Payer: Self-pay | Admitting: Internal Medicine

## 2016-10-21 ENCOUNTER — Ambulatory Visit: Payer: Self-pay | Attending: Internal Medicine | Admitting: Internal Medicine

## 2016-10-21 VITALS — BP 168/77 | HR 82 | Temp 98.3°F | Resp 16 | Wt 184.2 lb

## 2016-10-21 DIAGNOSIS — D179 Benign lipomatous neoplasm, unspecified: Secondary | ICD-10-CM | POA: Insufficient documentation

## 2016-10-21 DIAGNOSIS — E1122 Type 2 diabetes mellitus with diabetic chronic kidney disease: Secondary | ICD-10-CM | POA: Insufficient documentation

## 2016-10-21 DIAGNOSIS — Z794 Long term (current) use of insulin: Secondary | ICD-10-CM | POA: Insufficient documentation

## 2016-10-21 DIAGNOSIS — N183 Chronic kidney disease, stage 3 unspecified: Secondary | ICD-10-CM

## 2016-10-21 DIAGNOSIS — E1121 Type 2 diabetes mellitus with diabetic nephropathy: Secondary | ICD-10-CM | POA: Insufficient documentation

## 2016-10-21 DIAGNOSIS — Z7982 Long term (current) use of aspirin: Secondary | ICD-10-CM | POA: Insufficient documentation

## 2016-10-21 DIAGNOSIS — I129 Hypertensive chronic kidney disease with stage 1 through stage 4 chronic kidney disease, or unspecified chronic kidney disease: Secondary | ICD-10-CM | POA: Insufficient documentation

## 2016-10-21 DIAGNOSIS — E669 Obesity, unspecified: Secondary | ICD-10-CM | POA: Insufficient documentation

## 2016-10-21 DIAGNOSIS — I10 Essential (primary) hypertension: Secondary | ICD-10-CM

## 2016-10-21 LAB — GLUCOSE, POCT (MANUAL RESULT ENTRY): POC Glucose: 70 mg/dl (ref 70–99)

## 2016-10-21 LAB — POCT GLYCOSYLATED HEMOGLOBIN (HGB A1C): Hemoglobin A1C: 10.1

## 2016-10-21 MED ORDER — LISINOPRIL 5 MG PO TABS: 5.0000 mg | ORAL_TABLET | Freq: Every day | ORAL | 3 refills | Status: DC

## 2016-10-21 MED ORDER — METOPROLOL TARTRATE 25 MG PO TABS: 25.0000 mg | ORAL_TABLET | Freq: Two times a day (BID) | ORAL | 3 refills | Status: DC

## 2016-10-21 NOTE — Patient Instructions (Signed)
2 wk appt w/ Carilyn Goodpasture - RN, bp check and lab draw in 2 wks.  Low-Sodium Eating Plan Sodium raises blood pressure and causes water to be held in the body. Getting less sodium from food will help lower your blood pressure, reduce any swelling, and protect your heart, liver, and kidneys. We get sodium by adding salt (sodium chloride) to food. Most of our sodium comes from canned, boxed, and frozen foods. Restaurant foods, fast foods, and pizza are also very high in sodium. Even if you take medicine to lower your blood pressure or to reduce fluid in your body, getting less sodium from your food is important. What is my plan? Most people should limit their sodium intake to 2,300 mg a day. Your health care provider recommends that you limit your sodium intake to 2,000mg   a day. What do I need to know about this eating plan? For the low-sodium eating plan, you will follow these general guidelines:  Choose foods with a % Daily Value for sodium of less than 5% (as listed on the food label).  Use salt-free seasonings or herbs instead of table salt or sea salt.  Check with your health care provider or pharmacist before using salt substitutes.  Eat fresh foods.  Eat more vegetables and fruits.  Limit canned vegetables. If you do use them, rinse them well to decrease the sodium.  Limit cheese to 1 oz (28 g) per day.  Eat lower-sodium products, often labeled as "lower sodium" or "no salt added."  Avoid foods that contain monosodium glutamate (MSG). MSG is sometimes added to [place] food and some canned foods.  Check food labels (Nutrition Facts labels) on foods to learn how much sodium is in one serving.  Eat more home-cooked food and less restaurant, buffet, and fast food.  When eating at a restaurant, ask that your food be prepared with less salt, or no salt if possible. How do I read food labels for sodium information? The Nutrition Facts label lists the amount of sodium in one serving of the  food. If you eat more than one serving, you must multiply the listed amount of sodium by the number of servings. Food labels may also identify foods as:  Sodium free-Less than 5 mg in a serving.  Very low sodium-35 mg or less in a serving.  Low sodium-140 mg or less in a serving.  Light in sodium-50% less sodium in a serving. For example, if a food that usually has 300 mg of sodium is changed to become light in sodium, it will have 150 mg of sodium.  Reduced sodium-25% less sodium in a serving. For example, if a food that usually has 400 mg of sodium is changed to reduced sodium, it will have 300 mg of sodium. What foods can I eat? Grains  Low-sodium cereals, including oats, puffed wheat and rice, and shredded wheat cereals. Low-sodium crackers. Unsalted rice and pasta. Lower-sodium bread. Vegetables  Frozen or fresh vegetables. Low-sodium or reduced-sodium canned vegetables. Low-sodium or reduced-sodium tomato sauce and paste. Low-sodium or reduced-sodium tomato and vegetable juices. Fruits  Fresh, frozen, and canned fruit. Fruit juice. Meat and Other Protein Products  Low-sodium canned tuna and salmon. Fresh or frozen meat, poultry, seafood, and fish. Lamb. Unsalted nuts. Dried beans, peas, and lentils without added salt. Unsalted canned beans. Homemade soups without salt. Eggs. Dairy  Milk. Soy milk. Ricotta cheese. Low-sodium or reduced-sodium cheeses. Yogurt. Condiments  Fresh and dried herbs and spices. Salt-free seasonings. Onion and garlic powders.  Low-sodium varieties of mustard and ketchup. Fresh or refrigerated horseradish. Lemon juice. Fats and Oils  Reduced-sodium salad dressings. Unsalted butter. Other  Unsalted popcorn and pretzels. The items listed above may not be a complete list of recommended foods or beverages. Contact your dietitian for more options.  What foods are not recommended? Grains  Instant hot cereals. Bread stuffing, pancake, and biscuit mixes. Croutons.  Seasoned rice or pasta mixes. Noodle soup cups. Boxed or frozen macaroni and cheese. Self-rising flour. Regular salted crackers. Vegetables  Regular canned vegetables. Regular canned tomato sauce and paste. Regular tomato and vegetable juices. Frozen vegetables in sauces. Salted [place] fries. Olives. Patricia [name]. Relishes. Sauerkraut. Salsa. Meat and Other Protein Products  Salted, canned, smoked, spiced, or pickled meats, seafood, or fish. Bacon, ham, sausage, hot dogs, corned beef, chipped beef, and packaged luncheon meats. Salt pork. Jerky. Pickled herring. Anchovies, regular canned tuna, and sardines. Salted nuts. Dairy  Processed cheese and cheese spreads. Cheese curds. Blue cheese and cottage cheese. Buttermilk. Condiments  Onion and garlic salt, seasoned salt, table salt, and sea salt. Canned and packaged gravies. Worcestershire sauce. Tartar sauce. Barbecue sauce. Teriyaki sauce. Soy sauce, including reduced sodium. Steak sauce. Fish sauce. Oyster sauce. Cocktail sauce. Horseradish that you find on the shelf. Regular ketchup and mustard. Meat flavorings and tenderizers. Bouillon cubes. Hot sauce. Tabasco sauce. Marinades. Taco seasonings. Relishes. Fats and Oils  Regular salad dressings. Salted butter. Margarine. Ghee. Bacon fat. Other  Potato and tortilla chips. Corn chips and puffs. Salted popcorn and pretzels. Canned or dried soups. Pizza. Frozen entrees and pot pies. The items listed above may not be a complete list of foods and beverages to avoid. Contact your dietitian for more information.  This information is not intended to replace advice given to you by your health care provider. Make sure you discuss any questions you have with your health care provider. Document Released: 03/20/2002 Document Revised: 03/05/2016 Document Reviewed: 08/02/2013 Elsevier Interactive Patient Education  2017 [employer] Inc[employer]   -Diabetes Mellitus and Food It is important for you to manage your blood sugar  (glucose) level. Your blood glucose level can be greatly affected by what you eat. Eating healthier foods in the appropriate amounts throughout the day at about the same time each day will help you control your blood glucose level. It can also help slow or prevent worsening of your diabetes mellitus. Healthy eating may even help you improve the level of your blood pressure and reach or maintain a healthy weight. General recommendations for healthful eating and cooking habits include:  Eating meals and snacks regularly. Avoid going long periods of time without eating to lose weight.  Eating a diet that consists mainly of plant-based foods, such as fruits, vegetables, nuts, legumes, and whole grains.  Using low-heat cooking methods, such as baking, instead of high-heat cooking methods, such as deep frying. Work with your dietitian to make sure you understand how to use the Nutrition Facts information on food labels. How can food affect me? Carbohydrates  Carbohydrates affect your blood glucose level more than any other type of food. Your dietitian will help you determine how many carbohydrates to eat at each meal and teach you how to count carbohydrates. Counting carbohydrates is important to keep your blood glucose at a healthy level, especially if you are using insulin or taking certain medicines for diabetes mellitus. Alcohol  Alcohol can cause sudden decreases in blood glucose (hypoglycemia), especially if you use insulin or take certain medicines for diabetes mellitus. Hypoglycemia  can be a life-threatening condition. Symptoms of hypoglycemia (sleepiness, dizziness, and disorientation) are similar to symptoms of having too much alcohol. If your health care provider has given you approval to drink alcohol, do so in moderation and use the following guidelines:  Women should not have more than one drink per day, and men should not have more than two drinks per day. One drink is equal to:  12 oz of  beer.  5 oz of wine.  1 oz of hard liquor.  Do not drink on an empty stomach.  Keep yourself hydrated. Have water, diet soda, or unsweetened iced tea.  Regular soda, juice, and other mixers might contain a lot of carbohydrates and should be counted. What foods are not recommended? As you make food choices, it is important to remember that all foods are not the same. Some foods have fewer nutrients per serving than other foods, even though they might have the same number of calories or carbohydrates. It is difficult to get your body what it needs when you eat foods with fewer nutrients. Examples of foods that you should avoid that are high in calories and carbohydrates but low in nutrients include:  Trans fats (most processed foods list trans fats on the Nutrition Facts label).  Regular soda.  Juice.  Candy.  Sweets, such as cake, pie, doughnuts, and cookies.  Fried foods. What foods can I eat? Eat nutrient-rich foods, which will nourish your body and keep you healthy. The food you should eat also will depend on several factors, including:  The calories you need.  The medicines you take.  Your weight.  Your blood glucose level.  Your blood pressure level.  Your cholesterol level. You should eat a variety of foods, including:  Protein.  Lean cuts of meat.  Proteins low in saturated fats, such as fish, egg whites, and beans. Avoid processed meats.  Fruits and vegetables.  Fruits and vegetables that may help control blood glucose levels, such as apples, mangoes, and yams.  Dairy products.  Choose fat-free or low-fat dairy products, such as milk, yogurt, and cheese.  Grains, bread, pasta, and rice.  Choose whole grain products, such as multigrain bread, whole oats, and brown rice. These foods may help control blood pressure.  Fats.  Foods containing healthful fats, such as nuts, avocado, olive oil, canola oil, and fish. Does everyone with diabetes mellitus  have the same meal plan? Because every person with diabetes mellitus is different, there is not one meal plan that works for everyone. It is very important that you meet with a dietitian who will help you create a meal plan that is just right for you. This information is not intended to replace advice given to you by your health care provider. Make sure you discuss any questions you have with your health care provider. Document Released: 06/25/2005 Document Revised: 03/05/2016 Document Reviewed: 08/25/2013 Elsevier Interactive Patient Education  2017 [employer].

## 2016-10-21 NOTE — Progress Notes (Addendum)
Patricia [name], is a 61 y.o. female  [id number]  [id number]  DOB - 09-21-1956  Chief Complaint  Patient presents with  . Hypertension        Subjective:   Patricia [name] is a 61 y.o. female here today for a follow up visit, w/ signif pmhx of htn, ckd 3 w/ dm2.  Last seen 12/19, and recent RN visit for bp on 10/13/16.  She states she is doing much better on her diet, and watches her salt more closely.  Here w/ dgt, who also has ckd 3. Co of trace edema in legs.  Patient has No headache, No chest pain, No abdominal pain - No Nausea, No new weakness tingling or numbness, No Cough - SOB.  No problems updated.  ALLERGIES: Allergies  Allergen Reactions  . Codone [Hydrocodone] Itching  . Hydralazine Swelling    legs  . Sulfa Antibiotics Itching and Rash    PAST MEDICAL HISTORY: Past Medical History:  Diagnosis Date  . Diabetes mellitus without complication ([place])   . Hypertension   . Obesity     MEDICATIONS AT HOME: Prior to Admission medications   Medication Sig Start Date End Date Taking? Authorizing Provider  amLODipine (NORVASC) 5 MG tablet Take 1 tablet (5 mg total) by mouth 2 (two) times daily. 09/29/16   Maren Reamer, MD  aspirin EC 81 MG tablet Take 1 tablet (81 mg total) by mouth daily. 09/29/16   Maren Reamer, MD  atorvastatin (LIPITOR) 20 MG tablet Take 0.5 tablets (10 mg total) by mouth daily. 09/29/16   Maren Reamer, MD  Blood Glucose Monitoring Suppl (TRUE METRIX METER) w/Device KIT Use as directed 09/08/16   Maren Reamer, MD  cyclobenzaprine (FLEXERIL) 10 MG tablet Take 1 tablet (10 mg total) by mouth 3 (three) times daily. Patient not taking: Reported on 09/08/2016 03/06/16   Charlann Lange, PA-C  escitalopram (LEXAPRO) 10 MG tablet Take 1 tablet (10 mg total) by mouth at bedtime as needed (anxiety/ insomnia). 09/29/16   Maren Reamer, MD  gabapentin (NEURONTIN) 100 MG capsule Take 1 capsule (100 mg total) by mouth at bedtime. 09/29/16    Maren Reamer, MD  glipiZIDE (GLUCOTROL) 10 MG tablet TAKE 1 TABLET BY MOUTH 2 TIMES DAILY BEFORE A MEAL. 09/29/16   Maren Reamer, MD  glucose blood (TRUE METRIX BLOOD GLUCOSE TEST) test strip Use as instructed 09/08/16   Maren Reamer, MD  insulin aspart (NOVOLOG) 100 UNIT/ML injection 10 units before meals; Unless eating small meal, Inject 8 units prior.  Do not inject if not eating. 09/29/16   Maren Reamer, MD  Insulin Glargine (LANTUS SOLOSTAR) 100 UNIT/ML Solostar Pen Inject 40 Units into the skin 2 (two) times daily. 09/29/16   Maren Reamer, MD  Insulin Pen Needle 29G X 12.7MM MISC 10 Units by Does not apply route 1 day or 1 dose. 01/28/15   Lance Bosch, NP  Insulin Syringe-Needle U-100 30G X 5/16" 1 ML MISC Test 3 times daily 05/29/15   Tresa Garter, MD  lisinopril (PRINIVIL,ZESTRIL) 5 MG tablet Take 1 tablet (5 mg total) by mouth daily. 10/21/16   Maren Reamer, MD  metoprolol tartrate (LOPRESSOR) 25 MG tablet Take 1 tablet (25 mg total) by mouth 2 (two) times daily. 10/21/16   Maren Reamer, MD  naproxen (NAPROSYN) 500 MG tablet Take 1 tablet (500 mg total) by mouth 2 (two) times daily. Patient not taking: Reported on 09/08/2016 03/06/16  Charlann Lange, PA-C  TRUEPLUS LANCETS 28G MISC Use as directed 09/08/16   Maren Reamer, MD     Objective:   Vitals:   10/21/16 0910  BP: (!) 168/77  Pulse: 82  Resp: 16  Temp: 98.3 F (36.8 C)  TempSrc: Oral  SpO2: 99%  Weight: 184 lb 3.2 oz (83.6 kg)    Exam General appearance : Awake, alert, not in any distress. Speech Clear. Not toxic looking, pleasant. HEENT: Atraumatic and Normocephalic, pupils equally reactive to light. Neck: supple, no JVD.  Chest:Good air entry bilaterally, no added sounds. CVS: S1 S2 regular, no murmurs/gallups or rubs. Abdomen: Bowel sounds active, Non tender and not distended with no gaurding, rigidity or rebound. Extremities: B/L Lower Ext shows trace edema, both legs are  warm to touch Neurology: Awake alert, and oriented X 3, CN II-XII grossly intact, Non focal Skin:No Rash, large lipoma 3 x 2 inch diameter, right flank, nttp.  - per pt uncomfortable /pain sometimes  Data Review Lab Results  Component Value Date   HGBA1C 10.1 10/21/2016   HGBA1C 13.7 08/10/2016   HGBA1C 14.4 04/28/2016    Depression screen PHQ 2/9 10/21/2016 09/29/2016 08/10/2016 04/28/2016 11/06/2015  Decreased Interest _0 Down, Depressed, Hopeless _1 PHQ - 2 Score _2 Altered sleeping 0 _3 Tired, decreased energy _4 Change in appetite _5 Feeling bad or failure about yourself  _6 Trouble concentrating 0 _7 Moving slowly or fidgety/restless 0 _8 Suicidal thoughts 0 0 0 0 0  PHQ-9 Score _9 Difficult doing work/chores - - - Very difficult Somewhat difficult      Assessment & Plan   1. Essential hypertension Uncontrolled, goal bp <130/80 - on metoprolol 25tid recently, but she admits to only taking it bid - updated metoprolol to 25bid - lengthy discussion w/ pt in regards to her ckd. Her cr did slightly improve on repeat bmp 10/13/16,  - will restart lisinopril 5 qd,  - fu w/ Rn [place] for bp chk in 2 wks, chk bmp at that time as well.  I will make adjustments to bp meds pending the bmp if need. thanks  2. CKD (chronic kidney disease) stage 3, GFR 30-59 ml/min - BASIC METABOLIC PANEL WITH GFR; Future  3. Diabetic nephropathy associated with type 2 diabetes mellitus ([place]) Improving! Encouraged her to continue same regimen and diet, increase exercise. - POCT glucose (manual entry)  70 - POCT glycosylated hemoglobin (Hb A1C) 10.2 (from 13.7 on 08/10/16)  4. 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 - she just wants a regular drivers license, I suspect she does not need to have all this ppwk filled out, recd she talks to someone else about appropriate steps  to give up her cdl.  5. Lipoma, right lower back - reassurance given, when gets [facility] discount/[facility] card, can send to surg for eval  Patient have been counseled extensively about nutrition and exercise  Return in about 3 months (around 01/19/2017), or if symptoms worsen or fail to improve.  The patient was given clear instructions to go to ER or return to medical center if symptoms don't improve, worsen or new problems develop. The patient verbalized understanding. The  patient was told to call to get lab results if they haven't heard anything in the next week.   This note has been created with Surveyor, quantity. Any transcriptional errors are unintentional.   Maren Reamer, MD, [place] [facility] [place], [place]   10/21/2016, 9:34 AM

## 2016-11-01 ENCOUNTER — Emergency Department (HOSPITAL_COMMUNITY): Payer: Self-pay

## 2016-11-01 ENCOUNTER — Emergency Department (HOSPITAL_COMMUNITY)
Admission: EM | Admit: 2016-11-01 | Discharge: 2016-11-01 | Disposition: A | Discharge status: Home / Self Care | Payer: Self-pay | Attending: Emergency Medicine | Admitting: Emergency Medicine

## 2016-11-01 ENCOUNTER — Encounter (HOSPITAL_COMMUNITY): Payer: Self-pay | Admitting: Emergency Medicine

## 2016-11-01 DIAGNOSIS — E1122 Type 2 diabetes mellitus with diabetic chronic kidney disease: Secondary | ICD-10-CM | POA: Insufficient documentation

## 2016-11-01 DIAGNOSIS — N183 Chronic kidney disease, stage 3 (moderate): Secondary | ICD-10-CM | POA: Insufficient documentation

## 2016-11-01 DIAGNOSIS — Z7982 Long term (current) use of aspirin: Secondary | ICD-10-CM | POA: Insufficient documentation

## 2016-11-01 DIAGNOSIS — Z794 Long term (current) use of insulin: Secondary | ICD-10-CM | POA: Insufficient documentation

## 2016-11-01 DIAGNOSIS — Z79899 Other long term (current) drug therapy: Secondary | ICD-10-CM | POA: Insufficient documentation

## 2016-11-01 DIAGNOSIS — I129 Hypertensive chronic kidney disease with stage 1 through stage 4 chronic kidney disease, or unspecified chronic kidney disease: Secondary | ICD-10-CM | POA: Insufficient documentation

## 2016-11-01 DIAGNOSIS — R1084 Generalized abdominal pain: Secondary | ICD-10-CM

## 2016-11-01 DIAGNOSIS — K529 Noninfective gastroenteritis and colitis, unspecified: Secondary | ICD-10-CM | POA: Insufficient documentation

## 2016-11-01 LAB — COMPREHENSIVE METABOLIC PANEL
ALK PHOS: 72 U/L (ref 38–126)
ALT: 22 U/L (ref 14–54)
ANION GAP: 8 (ref 5–15)
AST: 27 U/L (ref 15–41)
Albumin: 2.2 g/dL — ABNORMAL LOW (ref 3.5–5.0)
BUN: 26 mg/dL — ABNORMAL HIGH (ref 6–20)
CALCIUM: 7.9 mg/dL — AB (ref 8.9–10.3)
CO2: 22 mmol/L (ref 22–32)
Chloride: 106 mmol/L (ref 101–111)
Creatinine, Ser: 1.86 mg/dL — ABNORMAL HIGH (ref 0.44–1.00)
GFR, EST AFRICAN AMERICAN: 33 mL/min — AB (ref >60)
GFR, EST NON AFRICAN AMERICAN: 29 mL/min — AB (ref >60)
Glucose, Bld: 329 mg/dL — ABNORMAL HIGH (ref 65–99)
Potassium: 3.9 mmol/L (ref 3.5–5.1)
SODIUM: 136 mmol/L (ref 135–145)
Total Bilirubin: 0.3 mg/dL (ref 0.3–1.2)
Total Protein: 5 g/dL — ABNORMAL LOW (ref 6.5–8.1)

## 2016-11-01 LAB — URINALYSIS, ROUTINE W REFLEX MICROSCOPIC
BILIRUBIN URINE: NEGATIVE
HGB URINE DIPSTICK: NEGATIVE
KETONES UR: NEGATIVE mg/dL
LEUKOCYTES UA: NEGATIVE
NITRITE: NEGATIVE
PH: 6 (ref 5.0–8.0)
Specific Gravity, Urine: 1.015 (ref 1.005–1.030)

## 2016-11-01 LAB — CBC
HCT: 26.7 % — ABNORMAL LOW (ref 36.0–46.0)
HEMOGLOBIN: 8.7 g/dL — AB (ref 12.0–15.0)
MCH: 26.4 pg (ref 26.0–34.0)
MCHC: 32.6 g/dL (ref 30.0–36.0)
MCV: 80.9 fL (ref 78.0–100.0)
Platelets: 284 10*3/uL (ref 150–400)
RBC: 3.3 MIL/uL — AB (ref 3.87–5.11)
RDW: 14.6 % (ref 11.5–15.5)
WBC: 8.2 10*3/uL (ref 4.0–10.5)

## 2016-11-01 LAB — LIPASE, BLOOD: LIPASE: 43 U/L (ref 11–51)

## 2016-11-01 MED ORDER — SODIUM CHLORIDE 0.9 % IV BOLUS (SEPSIS)
1000.0000 mL | Freq: Once | INTRAVENOUS | Status: AC
  Administered 2016-11-01: 1000 mL via INTRAVENOUS

## 2016-11-01 MED ORDER — HYDROMORPHONE HCL 2 MG/ML IJ SOLN
1.0000 mg | Freq: Once | INTRAMUSCULAR | Status: AC
  Administered 2016-11-01: 1 mg via INTRAVENOUS
  Filled 2016-11-01: qty 1

## 2016-11-01 MED ORDER — ONDANSETRON HCL 4 MG/2ML IJ SOLN
4.0000 mg | Freq: Once | INTRAMUSCULAR | Status: AC
  Administered 2016-11-01: 4 mg via INTRAVENOUS
  Filled 2016-11-01: qty 2

## 2016-11-01 MED ORDER — GI COCKTAIL ~~LOC~~
30.0000 mL | Freq: Once | ORAL | Status: AC
  Administered 2016-11-01: 30 mL via ORAL
  Filled 2016-11-01: qty 30

## 2016-11-01 MED ORDER — OMEPRAZOLE 20 MG PO CPDR: 20.0000 mg | DELAYED_RELEASE_CAPSULE | Freq: Every day | ORAL | 0 refills | Status: DC

## 2016-11-01 MED ORDER — IOPAMIDOL (ISOVUE-300) INJECTION 61%
INTRAVENOUS | Status: AC
  Administered 2016-11-01: 80 mL
  Filled 2016-11-01: qty 100

## 2016-11-01 MED ORDER — HYDROCODONE-ACETAMINOPHEN 5-325 MG PO TABS: 1.0000 | ORAL_TABLET | ORAL | 0 refills | Status: DC | PRN

## 2016-11-01 MED ORDER — ONDANSETRON HCL 4 MG PO TABS: 4.0000 mg | ORAL_TABLET | Freq: Four times a day (QID) | ORAL | 0 refills | Status: DC

## 2016-11-01 MED ORDER — ONDANSETRON HCL 4 MG PO TABS: 4.0000 mg | ORAL_TABLET | Freq: Three times a day (TID) | ORAL | 0 refills | Status: DC | PRN

## 2016-11-01 MED ORDER — DICYCLOMINE HCL 20 MG PO TABS: 20.0000 mg | ORAL_TABLET | Freq: Three times a day (TID) | ORAL | 0 refills | Status: DC

## 2016-11-01 MED ORDER — DIPHENHYDRAMINE HCL 50 MG/ML IJ SOLN
25.0000 mg | Freq: Once | INTRAMUSCULAR | Status: AC
  Administered 2016-11-01: 25 mg via INTRAVENOUS
  Filled 2016-11-01: qty 1

## 2016-11-01 MED ORDER — DICYCLOMINE HCL 20 MG PO TABS: 20.0000 mg | ORAL_TABLET | Freq: Two times a day (BID) | ORAL | 0 refills | Status: DC

## 2016-11-01 NOTE — ED Triage Notes (Signed)
C/o sharp L side/abd pain since Thursday.  Pt states she took gas medicine without relief.  Reports nausea and loose stool. Last BM just pta.

## 2016-11-01 NOTE — ED Provider Notes (Signed)
[facility] DEPT Provider Note   CSN: [id number] Arrival date & time: 11/01/16  1657     History   Chief Complaint Chief Complaint  Patient presents with  . Abdominal Pain    HPI Patricia [name] is a 61 y.o. female.  HPI 33 y o F with PMH of HTN, Dm here with epigastric and left-sided abdominal pain. Pt states that her sx started four days ago as mild epigastric abdominal pain. Over the past several days, her pani has worsened and is now localized along the left upper abdomen as well. She has had nausea but no vomiting, with loose stool as well. No fevers. No hematemesis, melena, or hematochezia. Pain is an aching, gnawing feeling that radiates to her back. No dysuria, hematuria, or frequency. Pain worse with eating. No NSAID or ASA use.  Past Medical History:  Diagnosis Date  . Diabetes mellitus without complication ([place])   . Hypertension   . Obesity     Patient Active Problem List   Diagnosis Date Noted  . Lipoma 10/21/2016  . CKD (chronic kidney disease) stage 3, GFR 30-59 ml/min 08/10/2016  . Anxiety and depression 12/12/2015  . Essential hypertension 02/12/2015  . Hyperglycemia 02/12/2015  . Diabetic nephropathy associated with type 2 diabetes mellitus ([place]) 11/15/2014  . Flank pain 11/15/2014  . Colon cancer screening 10/01/2014  . Moderate nonproliferative diabetic retinopathy(362.05) 04/17/2014  . 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 Essential hypertension, benign 03/28/2014  . Dental caries 03/28/2014  . Depression 09/14/2013  . Dyslipidemia 04/18/2013  . Diabetes ([place]) 03/10/2013  . HTN (hypertension) 03/10/2013    Past Surgical History:  Procedure Laterality Date  . ABDOMINAL HYSTERECTOMY    . BREAST SURGERY     reduction  . CESAREAN SECTION  1982, 1988    OB History    Gravida Para Term Preterm AB Living   2 2 2     2    SAB TAB Ectopic Multiple Live Births                   Home Medications    Prior to Admission medications     Medication Sig Start Date End Date Taking? Authorizing Provider  alum & mag hydroxide-simeth (MAALOX/MYLANTA) 200-200-20 MG/5ML suspension Take 15 mLs by mouth every 6 (six) hours as needed for indigestion or heartburn.   Yes Historical Provider, MD  amLODipine (NORVASC) 5 MG tablet Take 1 tablet (5 mg total) by mouth 2 (two) times daily. 09/29/16  Yes Maren Reamer, MD  aspirin EC 81 MG tablet Take 1 tablet (81 mg total) by mouth daily. 09/29/16  Yes Maren Reamer, MD  atorvastatin (LIPITOR) 20 MG tablet Take 0.5 tablets (10 mg total) by mouth daily. 09/29/16  Yes Maren Reamer, MD  Blood Glucose Monitoring Suppl (TRUE METRIX METER) w/Device KIT Use as directed 09/08/16  Yes Maren Reamer, MD  escitalopram (LEXAPRO) 10 MG tablet Take 1 tablet (10 mg total) by mouth at bedtime as needed (anxiety/ insomnia). 09/29/16  Yes Maren Reamer, MD  gabapentin (NEURONTIN) 100 MG capsule Take 1 capsule (100 mg total) by mouth at bedtime. 09/29/16  Yes Dawn T Janne Napoleon, MD  glipiZIDE (GLUCOTROL) 10 MG tablet TAKE 1 TABLET BY MOUTH 2 TIMES DAILY BEFORE A MEAL. 09/29/16  Yes Maren Reamer, MD  glucose blood (TRUE METRIX BLOOD GLUCOSE TEST) test strip Use as instructed 09/08/16  Yes Dawn T Janne Napoleon, MD  insulin aspart (NOVOLOG) 100 UNIT/ML injection 10 units before  meals; Unless eating small meal, Inject 8 units prior.  Do not inject if not eating. 09/29/16  Yes Maren Reamer, MD  Insulin Glargine (LANTUS SOLOSTAR) 100 UNIT/ML Solostar Pen Inject 40 Units into the skin 2 (two) times daily. 09/29/16  Yes Maren Reamer, MD  Insulin Pen Needle 29G X 12.7MM MISC 10 Units by Does not apply route 1 day or 1 dose. 01/28/15  Yes Lance Bosch, NP  Insulin Syringe-Needle U-100 30G X 5/16" 1 ML MISC Test 3 times daily 05/29/15  Yes Olugbemiga E Doreene Burke, MD  lisinopril (PRINIVIL,ZESTRIL) 5 MG tablet Take 1 tablet (5 mg total) by mouth daily. 10/21/16  Yes Maren Reamer, MD  metoprolol tartrate  (LOPRESSOR) 25 MG tablet Take 1 tablet (25 mg total) by mouth 2 (two) times daily. 10/21/16  Yes Maren Reamer, MD  TRUEPLUS LANCETS 28G MISC Use as directed 09/08/16  Yes Maren Reamer, MD  dicyclomine (BENTYL) 20 MG tablet Take 1 tablet (20 mg total) by mouth 4 (four) times daily -  before meals and at bedtime. 11/01/16   Duffy Bruce, MD  HYDROcodone-acetaminophen (NORCO/VICODIN) 5-325 MG tablet Take 1-2 tablets by mouth every 4 (four) hours as needed for severe pain. 11/01/16   Duffy Bruce, MD  omeprazole (PRILOSEC) 20 MG capsule Take 1 capsule (20 mg total) by mouth daily. 11/01/16 11/15/16  Duffy Bruce, MD  ondansetron (ZOFRAN) 4 MG tablet Take 1 tablet (4 mg total) by mouth every 8 (eight) hours as needed for nausea or vomiting. 11/01/16   Duffy Bruce, MD    Family History Family History  Problem Relation Age of Onset  . Diabetes Brother   . Cancer Brother     stomach    Social History Social History  Substance Use Topics  . Smoking status: Never Smoker  . Smokeless tobacco: Never Used  . Alcohol use No     Allergies   Codone [hydrocodone]; Hydralazine; and Sulfa antibiotics   Review of Systems Review of Systems  Constitutional: Positive for fatigue. Negative for fever.  Gastrointestinal: Positive for abdominal pain and nausea. Negative for vomiting.  Genitourinary: Positive for flank pain.  All other systems reviewed and are negative.    Physical Exam Updated Vital Signs BP 164/78   Pulse 93   Temp 98.3 F (36.8 C) (Oral)   Resp 14   Ht 5' 4"  (1.626 m)   Wt 184 lb (83.5 kg)   SpO2 94%   BMI 31.58 kg/m   Physical Exam  Constitutional: She is oriented to person, place, and time. She appears well-developed and well-nourished. No distress.  HENT:  Head: Normocephalic and atraumatic.  Mouth/Throat: Oropharynx is clear and moist. No oropharyngeal exudate.  Eyes: Conjunctivae are normal.  Neck: Neck supple.  Cardiovascular: Normal rate, regular  rhythm and normal heart sounds.  Exam reveals no friction rub.   No murmur heard. Pulmonary/Chest: Effort normal and breath sounds normal. No respiratory distress. She has no wheezes. She has no rales.  Abdominal: Soft. Normal appearance. She exhibits no distension. There is tenderness in the epigastric area, periumbilical area and left upper quadrant. There is no rigidity, no rebound, no guarding and no CVA tenderness.  Musculoskeletal: She exhibits no edema.  Neurological: She is alert and oriented to person, place, and time. She exhibits normal muscle tone.  Skin: Skin is warm. Capillary refill takes less than 2 seconds.  Psychiatric: She has a normal mood and affect.  Nursing note and vitals reviewed.  ED Treatments / Results  Labs (all labs ordered are listed, but only abnormal results are displayed) Labs Reviewed  COMPREHENSIVE METABOLIC PANEL - Abnormal; Notable for the following:       Result Value   Glucose, Bld 329 (*)    BUN 26 (*)    Creatinine, Ser 1.86 (*)    Calcium 7.9 (*)    Total Protein 5.0 (*)    Albumin 2.2 (*)    GFR calc non Af Amer 29 (*)    GFR calc Af Amer 33 (*)    All other components within normal limits  CBC - Abnormal; Notable for the following:    RBC 3.30 (*)    Hemoglobin 8.7 (*)    HCT 26.7 (*)    All other components within normal limits  URINALYSIS, ROUTINE W REFLEX MICROSCOPIC - Abnormal; Notable for the following:    Glucose, UA >=500 (*)    Protein, ur >=300 (*)    Bacteria, UA RARE (*)    Squamous Epithelial / LPF 0-5 (*)    All other components within normal limits  LIPASE, BLOOD    EKG  EKG Interpretation None       Radiology Ct Abdomen Pelvis W Contrast  Result Date: 11/01/2016 CLINICAL DATA:  Left side abdominal pain starting Thursday, nausea, diarrhea EXAM: CT ABDOMEN AND PELVIS WITH CONTRAST TECHNIQUE: Multidetector CT imaging of the abdomen and pelvis was performed using the standard protocol following bolus  administration of intravenous contrast. CONTRAST:  43m ISOVUE-300 IOPAMIDOL (ISOVUE-300) INJECTION 61% COMPARISON:  11/04/2014 FINDINGS: Lower chest: Lung bases shows small left pleural effusion. Left base anterior laterally atelectasis or scarring. Small atelectasis noted left base posteriorly. Hepatobiliary: No focal hepatic mass. No calcified gallstones are noted within gallbladder. Pancreas: Minimal prominent pancreatic head region. Body and tail of the pancreas shows normal enhancement. Small fluid noted just posterior to pancreatic tail anterior to left kidney axial image 24. No focal pancreatic mass. Subtle mild stranding of the fat between pancreatic head and duodenum. There is mild stranding of the fat surrounding duodenum. Trace edema duodenal wall. Findings highly suspicious for segmental enteritis, pancreatic head pancreatitis cannot be excluded. Clinical correlation is necessary. There is no evidence of small bowel obstruction. Trace stranding noted in retroperitoneum just inferior to distal duodenum. Spleen: Enhanced spleen with normal appearance. Adrenals/Urinary Tract: No adrenal gland mass. Nonspecific mild perinephric stranding/ fluid. No hydronephrosis or hydroureter. Delayed renal images shows bilateral renal symmetrical excretion. Bilateral visualized proximal ureter is unremarkable. The urinary bladder is under distended grossly unremarkable. Stomach/Bowel: No definite evidence of small bowel obstruction. The terminal ileum is unremarkable. Normal appendix is noted in axial image 70. No pericecal inflammation. Some stool noted within cecum. Trace free fluid noted bilateral paracolic gutter. Descending colon diverticula are noted. Multiple sigmoid colon diverticula. No evidence of acute colitis or diverticulitis. There is mild redundant sigmoid colon. Vascular/Lymphatic: No aortic aneurysm. Mild atherosclerotic calcifications right common iliac artery. No adenopathy. Reproductive: The patient  is status post hysterectomy. Other: Sagittal images of the spine shows a infraumbilical hernia containing omental fat measures 3.6 cm without evidence of acute complication. Trace free fluid is noted within right posterior pelvis. Musculoskeletal: No destructive bony lesions are noted. Mild degenerative changes lumbar spine. Mild posterior disc bulge and minimal anterolisthesis at L4-L5 level. Facet degenerative changes noted L4 and L5 level. IMPRESSION: 1. There is subtle mild prominence of pancreatic head region. There is mild stranding of the fat between pancreatic head and duodenum. Mild  edema in duodenal wall. Findings highly suspicious for enteritis/duodenitis, ulcer disease or pancreatic head pancreatitis cannot be excluded. Clinical correlation is necessary. Please note noted there is small fluid just posterior to pancreatic tail anterior to left kidney axial image 24. Mild pancreatitis again cannot be excluded. 2. No evidence of small bowel obstruction. 3. Normal appendix.  No pericecal inflammation. 4. Trace stranding and fluid in noted within retroperitoneum inferior to duodenum and bilateral paracolic gutters. Nonspecific mild perinephric stranding. 5. No hydronephrosis or hydroureter. 6. Status post hysterectomy. 7. Colonic diverticula are noted descending colon and sigmoid colon. No evidence of acute colitis or diverticulitis. 8. Degenerative changes lumbar spine most significant at L4-L5 level. Electronically Signed   By: Lahoma Crocker M.D.   On: 11/01/2016 21:36    Procedures Procedures (including critical care time)  Medications Ordered in ED Medications  sodium chloride 0.9 % bolus 1,000 mL (0 mLs Intravenous Stopped 11/01/16 2151)  HYDROmorphone (DILAUDID) injection 1 mg (1 mg Intravenous Given 11/01/16 2009)  ondansetron (ZOFRAN) injection 4 mg (4 mg Intravenous Given 11/01/16 2008)  iopamidol (ISOVUE-300) 61 % injection (80 mLs  Contrast Given 11/01/16 2034)  diphenhydrAMINE (BENADRYL)  injection 25 mg (25 mg Intravenous Given 11/01/16 2113)  gi cocktail (Maalox,Lidocaine,Donnatal) (30 mLs Oral Given 11/01/16 2151)     Initial Impression / Assessment and Plan / ED Course  I have reviewed the triage vital signs and the nursing notes.  Pertinent labs & imaging results that were available during my care of the patient were reviewed by me and considered in my medical decision making (see chart for details).     61 yo F with PMHx HTN, DM here with epigastric abdominal pain, nausea, loose stools. Exam as above. Lab work overall reassuring and at baseline. Normal WBC. CT scan obtained given significant TTP and shows possible enteritis versus duodenitis versus pancreatitis. Lipase normal, making pancreatitis less likely. Suspect pt's sx are 2/2 viral Gi illness, possible food-borne illness. Do not suspect significant or invasive bacterial infection. Pt sx markedly improved after IVF and pt is now tolerating PO without difficulty. Will d/c with supportive care, encourage fluids, and d/c home.  Final Clinical Impressions(s) / ED Diagnoses   Final diagnoses:  Generalized abdominal pain  Enteritis    New Prescriptions Discharge Medication List as of 11/01/2016 11:07 PM    START taking these medications   Details  dicyclomine (BENTYL) 20 MG tablet Take 1 tablet (20 mg total) by mouth 2 (two) times daily., Starting Sun 11/01/2016, Print    HYDROcodone-acetaminophen (NORCO/VICODIN) 5-325 MG tablet Take 1-2 tablets by mouth every 4 (four) hours as needed for severe pain., Starting Sun 11/01/2016, Print    omeprazole (PRILOSEC) 20 MG capsule Take 1 capsule (20 mg total) by mouth daily., Starting Sun 11/01/2016, Until Sun 11/15/2016, Print    ondansetron (ZOFRAN) 4 MG tablet Take 1 tablet (4 mg total) by mouth every 6 (six) hours., Starting Sun 11/01/2016, Print         Duffy Bruce, MD 11/02/16 [phone]

## 2016-11-01 NOTE — ED Notes (Signed)
Pt given a diet ginger ale for PO challenge

## 2016-11-01 NOTE — ED Notes (Signed)
Pt able to keep fluids down.  

## 2016-11-03 ENCOUNTER — Telehealth: Payer: Self-pay | Admitting: Internal Medicine

## 2016-11-03 NOTE — Telephone Encounter (Signed)
Patient called the office asking to speak with nurse regarding the swelling that she is experiencing on her leg. Pt wants to know if a prescription can be called in. Please follow up.

## 2016-11-04 ENCOUNTER — Ambulatory Visit (HOSPITAL_COMMUNITY)
Admission: RE | Admit: 2016-11-04 | Discharge: 2016-11-04 | Disposition: A | Discharge status: Home / Self Care | Payer: Self-pay | Source: Ambulatory Visit | Attending: Internal Medicine | Admitting: Internal Medicine

## 2016-11-04 ENCOUNTER — Ambulatory Visit: Payer: Self-pay | Attending: Internal Medicine | Admitting: Internal Medicine

## 2016-11-04 ENCOUNTER — Ambulatory Visit: Payer: Self-pay

## 2016-11-04 ENCOUNTER — Encounter (INDEPENDENT_AMBULATORY_CARE_PROVIDER_SITE_OTHER): Payer: Self-pay

## 2016-11-04 VITALS — BP 164/80 | HR 102 | Resp 20 | Wt 191.0 lb

## 2016-11-04 DIAGNOSIS — Z7982 Long term (current) use of aspirin: Secondary | ICD-10-CM | POA: Insufficient documentation

## 2016-11-04 DIAGNOSIS — J9811 Atelectasis: Secondary | ICD-10-CM | POA: Insufficient documentation

## 2016-11-04 DIAGNOSIS — Z794 Long term (current) use of insulin: Secondary | ICD-10-CM | POA: Insufficient documentation

## 2016-11-04 DIAGNOSIS — N183 Chronic kidney disease, stage 3 unspecified: Secondary | ICD-10-CM

## 2016-11-04 DIAGNOSIS — R600 Localized edema: Secondary | ICD-10-CM | POA: Insufficient documentation

## 2016-11-04 DIAGNOSIS — I129 Hypertensive chronic kidney disease with stage 1 through stage 4 chronic kidney disease, or unspecified chronic kidney disease: Secondary | ICD-10-CM | POA: Insufficient documentation

## 2016-11-04 DIAGNOSIS — I10 Essential (primary) hypertension: Secondary | ICD-10-CM

## 2016-11-04 DIAGNOSIS — J90 Pleural effusion, not elsewhere classified: Secondary | ICD-10-CM | POA: Insufficient documentation

## 2016-11-04 DIAGNOSIS — E1122 Type 2 diabetes mellitus with diabetic chronic kidney disease: Secondary | ICD-10-CM | POA: Insufficient documentation

## 2016-11-04 DIAGNOSIS — E669 Obesity, unspecified: Secondary | ICD-10-CM | POA: Insufficient documentation

## 2016-11-04 LAB — BASIC METABOLIC PANEL WITH GFR
BUN: 26 mg/dL — ABNORMAL HIGH (ref 7–25)
CALCIUM: 8.3 mg/dL — AB (ref 8.6–10.4)
CO2: 22 mmol/L (ref 20–31)
CREATININE: 2.05 mg/dL — AB (ref 0.50–1.05)
Chloride: 110 mmol/L (ref 98–110)
GFR, Est African American: 30 mL/min — ABNORMAL LOW (ref >=60)
GFR, Est Non African American: 26 mL/min — ABNORMAL LOW (ref >=60)
Glucose, Bld: 60 mg/dL — ABNORMAL LOW (ref 65–99)
Potassium: 4.2 mmol/L (ref 3.5–5.3)
SODIUM: 142 mmol/L (ref 135–146)

## 2016-11-04 LAB — D-DIMER, QUANTITATIVE: D-Dimer, Quant: 1.95 mcg/mL FEU — ABNORMAL HIGH (ref <0.50)

## 2016-11-04 MED ORDER — FUROSEMIDE 20 MG PO TABS: 20.0000 mg | ORAL_TABLET | Freq: Every day | ORAL | 0 refills | Status: DC

## 2016-11-04 MED ORDER — ISOSORBIDE MONONITRATE ER 30 MG PO TB24: 30.0000 mg | ORAL_TABLET | Freq: Every day | ORAL | 2 refills | Status: DC

## 2016-11-04 MED FILL — ?ISOSORBIDE MN ER 30 MG TAB: 30 | 30 days supply | Qty: 30 | Fill #0

## 2016-11-04 MED FILL — !NOVOLOG 100UNITS/ML VIAL: 100/ML | 33 days supply | Qty: 10 | Fill #1

## 2016-11-04 MED FILL — glipiZIDE 10 MG TABS: 10 | 30 days supply | Qty: 60 | Fill #1

## 2016-11-04 MED FILL — ?FUROSEMIDE 20 MG TABLET: 20 | 4 days supply | Qty: 4 | Fill #0

## 2016-11-04 NOTE — Progress Notes (Signed)
Patricia [name], is a 61 y.o. female  NWG:[id number]  [id number]:[id number]  DOB - [birth year]  No chief complaint on file.       Subjective:   Patricia [name] is a 61 y.o. female here today for a follow up visit for bp visit.  Noted new lew edema 2+ bilat, nonpitting. Pt denies heavy salt diet, has been traveling a lot to visit family out of town, drive about [address].  Denies leg pain. Has been taking all her meds, including norvasc.   Patient has No headache, No chest pain, No abdominal pain - No Nausea, No new weakness tingling or numbness, No Cough - SOB.  No pleuritic pain.  No orthopnea.  No problems updated.  ALLERGIES: Allergies  Allergen Reactions  . Codone [Hydrocodone] Itching  . Hydralazine Swelling    legs  . Sulfa Antibiotics Itching and Rash    PAST MEDICAL HISTORY: Past Medical History:  Diagnosis Date  . Diabetes mellitus without complication ([place])   . Hypertension   . Obesity     MEDICATIONS AT HOME: Prior to Admission medications   Medication Sig Start Date End Date Taking? Authorizing Provider  alum & mag hydroxide-simeth (MAALOX/MYLANTA) 200-200-20 MG/5ML suspension Take 15 mLs by mouth every 6 (six) hours as needed for indigestion or heartburn.    Historical Provider, MD  aspirin EC 81 MG tablet Take 1 tablet (81 mg total) by mouth daily. 09/29/16   Maren Reamer, MD  atorvastatin (LIPITOR) 20 MG tablet Take 0.5 tablets (10 mg total) by mouth daily. 09/29/16   Maren Reamer, MD  Blood Glucose Monitoring Suppl (TRUE METRIX METER) w/Device KIT Use as directed 09/08/16   Maren Reamer, MD  dicyclomine (BENTYL) 20 MG tablet Take 1 tablet (20 mg total) by mouth 4 (four) times daily -  before meals and at bedtime. 11/01/16   Duffy Bruce, MD  escitalopram (LEXAPRO) 10 MG tablet Take 1 tablet (10 mg total) by mouth at bedtime as needed (anxiety/ insomnia). 09/29/16   Maren Reamer, MD  furosemide (LASIX) 20 MG tablet Take 1 tablet (20 mg total) by mouth  daily. 11/04/16   Maren Reamer, MD  gabapentin (NEURONTIN) 100 MG capsule Take 1 capsule (100 mg total) by mouth at bedtime. 09/29/16   Maren Reamer, MD  glipiZIDE (GLUCOTROL) 10 MG tablet TAKE 1 TABLET BY MOUTH 2 TIMES DAILY BEFORE A MEAL. 09/29/16   Maren Reamer, MD  glucose blood (TRUE METRIX BLOOD GLUCOSE TEST) test strip Use as instructed 09/08/16   Maren Reamer, MD  HYDROcodone-acetaminophen (NORCO/VICODIN) 5-325 MG tablet Take 1-2 tablets by mouth every 4 (four) hours as needed for severe pain. 11/01/16   Duffy Bruce, MD  insulin aspart (NOVOLOG) 100 UNIT/ML injection 10 units before meals; Unless eating small meal, Inject 8 units prior.  Do not inject if not eating. 09/29/16   Maren Reamer, MD  Insulin Glargine (LANTUS SOLOSTAR) 100 UNIT/ML Solostar Pen Inject 40 Units into the skin 2 (two) times daily. 09/29/16   Maren Reamer, MD  Insulin Pen Needle 29G X 12.7MM MISC 10 Units by Does not apply route 1 day or 1 dose. 01/28/15   Lance Bosch, NP  Insulin Syringe-Needle U-100 30G X 5/16" 1 ML MISC Test 3 times daily 05/29/15   Tresa Garter, MD  isosorbide mononitrate (IMDUR) 30 MG 24 hr tablet Take 1 tablet (30 mg total) by mouth daily. 11/04/16   Maren Reamer, MD  lisinopril (PRINIVIL,ZESTRIL) 5 MG tablet Take 1 tablet (5 mg total) by mouth daily. 10/21/16   Maren Reamer, MD  metoprolol tartrate (LOPRESSOR) 25 MG tablet Take 1 tablet (25 mg total) by mouth 2 (two) times daily. 10/21/16   Maren Reamer, MD  omeprazole (PRILOSEC) 20 MG capsule Take 1 capsule (20 mg total) by mouth daily. 11/01/16 11/15/16  Duffy Bruce, MD  ondansetron (ZOFRAN) 4 MG tablet Take 1 tablet (4 mg total) by mouth every 8 (eight) hours as needed for nausea or vomiting. 11/01/16   Duffy Bruce, MD  TRUEPLUS LANCETS 28G MISC Use as directed 09/08/16   Maren Reamer, MD     Objective:   Vitals:   11/04/16 0953 11/04/16 0959  BP: (!) 162/80 (!) 164/80  Pulse: (!) 102 (!)  102  Resp: 20   SpO2: 99%   Weight: 191 lb (86.6 kg)     Exam General appearance : Awake, alert, not in any distress. Speech Clear. Not toxic looking, pleasant. HEENT: Atraumatic and Normocephalic, pupils equally reactive to light. Neck: supple, no JVD. No cervical lymphadenopathy.  Chest:Good air entry bilaterally, no added sounds. CVS: S1 S2 regular, no murmurs/gallups or rubs. Abdomen: Bowel sounds active, obese. Non tender Extremities: B/L Lower Ext shows nonpitting, tight,  2+  Edema up to bilat calves, neg Homan's sign bilat, nttp  both legs are warm to touch Neurology: Awake alert, and oriented X 3, CN II-XII grossly intact, Non focal Skin:No Rash  Data Review Lab Results  Component Value Date   HGBA1C 10.1 10/21/2016   HGBA1C 13.7 08/10/2016   HGBA1C 14.4 04/28/2016    Depression screen PHQ 2/9 10/21/2016 09/29/2016 08/10/2016 04/28/2016 11/06/2015  Decreased Interest _0 Down, Depressed, Hopeless _1 PHQ - 2 Score _2 Altered sleeping 0 _3 Tired, decreased energy _4 Change in appetite _5 Feeling bad or failure about yourself  _6 Trouble concentrating 0 _7 Moving slowly or fidgety/restless 0 _8 Suicidal thoughts 0 0 0 0 0  PHQ-9 Score _9 Difficult doing work/chores - - - Very difficult Somewhat difficult      Assessment & Plan   1. Leg edema, doubt dvt  Etiology unclear. -stop norvasc - concern may be associated w/ lower extremity edema - made allergy for now -lasix 2m daily x 4 days for now - rx for ted hose provided - Brain natriuretic peptide, if +, get echo - DG Chest 2 View; Future, eval chf - D-dimer, quantitative ([facility]  - eval risk dvt, wells criteria low. - low salt diet advised.  2. Htn, uncontrolled, w/ ckd 3 - difficult situation given numerous possible allergies and her ckd. - chking bmp today, if able may be able to increase acei. - dc norvasc - started  imdur 30 qd for now    Patient have been counseled extensively about nutrition and exercise  Return in about 1 week (around 11/11/2016) for le edema.  The patient was given clear instructions to go to ER or return to medical center if symptoms don't improve, worsen or new problems develop. The patient verbalized understanding. The patient was told to call to get lab results if they haven't heard anything in the next week.   This note  has been created with Surveyor, quantity. Any transcriptional errors are unintentional.   Maren Reamer, MD, [place] [facility], [place]   11/04/2016, 10:17 AM

## 2016-11-04 NOTE — Progress Notes (Signed)
F/u BP check.  Has +2 pitting edema BLE. Denies SOB, chest pain. Assessed lungs sounds to be clear in all fields.  States she see floaters sometimes. Pt informed this could be associated with diabetes condition. This morning blood sugar was 68 at home. She verbalized understanding that this is a low level and verbalized importance of meal selection. Due to the sudden death of her sister, she has been busier that usual. Encouraged to elevate legs on pillows above heart.  Dr. Janne Napoleon at bedside to speak to patient about BLE edema and medications.

## 2016-11-04 NOTE — Patient Instructions (Signed)
Low-Sodium Eating Plan Sodium raises blood pressure and causes water to be held in the body. Getting less sodium from food will help lower your blood pressure, reduce any swelling, and protect your heart, liver, and kidneys. We get sodium by adding salt (sodium chloride) to food. Most of our sodium comes from canned, boxed, and frozen foods. Restaurant foods, fast foods, and pizza are also very high in sodium. Even if you take medicine to lower your blood pressure or to reduce fluid in your body, getting less sodium from your food is important. What is my plan? Most people should limit their sodium intake to 2,300 mg a day. Your health care provider recommends that you limit your sodium intake to __________ a day. What do I need to know about this eating plan? For the low-sodium eating plan, you will follow these general guidelines:  Choose foods with a % Daily Value for sodium of less than 5% (as listed on the food label).  Use salt-free seasonings or herbs instead of table salt or sea salt.  Check with your health care provider or pharmacist before using salt substitutes.  Eat fresh foods.  Eat more vegetables and fruits.  Limit canned vegetables. If you do use them, rinse them well to decrease the sodium.  Limit cheese to 1 oz (28 g) per day.  Eat lower-sodium products, often labeled as "lower sodium" or "no salt added."  Avoid foods that contain monosodium glutamate (MSG). MSG is sometimes added to [place] food and some canned foods.  Check food labels (Nutrition Facts labels) on foods to learn how much sodium is in one serving.  Eat more home-cooked food and less restaurant, buffet, and fast food.  When eating at a restaurant, ask that your food be prepared with less salt, or no salt if possible. How do I read food labels for sodium information? The Nutrition Facts label lists the amount of sodium in one serving of the food. If you eat more than one serving, you must multiply the  listed amount of sodium by the number of servings. Food labels may also identify foods as:  Sodium free-Less than 5 mg in a serving.  Very low sodium-35 mg or less in a serving.  Low sodium-140 mg or less in a serving.  Light in sodium-50% less sodium in a serving. For example, if a food that usually has 300 mg of sodium is changed to become light in sodium, it will have 150 mg of sodium.  Reduced sodium-25% less sodium in a serving. For example, if a food that usually has 400 mg of sodium is changed to reduced sodium, it will have 300 mg of sodium. What foods can I eat? Grains  Low-sodium cereals, including oats, puffed wheat and rice, and shredded wheat cereals. Low-sodium crackers. Unsalted rice and pasta. Lower-sodium bread. Vegetables  Frozen or fresh vegetables. Low-sodium or reduced-sodium canned vegetables. Low-sodium or reduced-sodium tomato sauce and paste. Low-sodium or reduced-sodium tomato and vegetable juices. Fruits  Fresh, frozen, and canned fruit. Fruit juice. Meat and Other Protein Products  Low-sodium canned tuna and salmon. Fresh or frozen meat, poultry, seafood, and fish. Lamb. Unsalted nuts. Dried beans, peas, and lentils without added salt. Unsalted canned beans. Homemade soups without salt. Eggs. Dairy  Milk. Soy milk. Ricotta cheese. Low-sodium or reduced-sodium cheeses. Yogurt. Condiments  Fresh and dried herbs and spices. Salt-free seasonings. Onion and garlic powders. Low-sodium varieties of mustard and ketchup. Fresh or refrigerated horseradish. Lemon juice. Fats and Oils  Reduced-sodium  salad dressings. Unsalted butter. Other  Unsalted popcorn and pretzels. The items listed above may not be a complete list of recommended foods or beverages. Contact your dietitian for more options.  What foods are not recommended? Grains  Instant hot cereals. Bread stuffing, pancake, and biscuit mixes. Croutons. Seasoned rice or pasta mixes. Noodle soup cups. Boxed or  frozen macaroni and cheese. Self-rising flour. Regular salted crackers. Vegetables  Regular canned vegetables. Regular canned tomato sauce and paste. Regular tomato and vegetable juices. Frozen vegetables in sauces. Salted [place] fries. Olives. Angie Fava. Relishes. Sauerkraut. Salsa. Meat and Other Protein Products  Salted, canned, smoked, spiced, or pickled meats, seafood, or fish. Bacon, ham, sausage, hot dogs, corned beef, chipped beef, and packaged luncheon meats. Salt pork. Jerky. Pickled herring. Anchovies, regular canned tuna, and sardines. Salted nuts. Dairy  Processed cheese and cheese spreads. Cheese curds. Blue cheese and cottage cheese. Buttermilk. Condiments  Onion and garlic salt, seasoned salt, table salt, and sea salt. Canned and packaged gravies. Worcestershire sauce. Tartar sauce. Barbecue sauce. Teriyaki sauce. Soy sauce, including reduced sodium. Steak sauce. Fish sauce. Oyster sauce. Cocktail sauce. Horseradish that you find on the shelf. Regular ketchup and mustard. Meat flavorings and tenderizers. Bouillon cubes. Hot sauce. Tabasco sauce. Marinades. Taco seasonings. Relishes. Fats and Oils  Regular salad dressings. Salted butter. Margarine. Ghee. Bacon fat. Other  Potato and tortilla chips. Corn chips and puffs. Salted popcorn and pretzels. Canned or dried soups. Pizza. Frozen entrees and pot pies. The items listed above may not be a complete list of foods and beverages to avoid. Contact your dietitian for more information.  This information is not intended to replace advice given to you by your health care provider. Make sure you discuss any questions you have with your health care provider. Document Released: 03/20/2002 Document Revised: 03/05/2016 Document Reviewed: 08/02/2013 Elsevier Interactive Patient Education  2017 [employer].

## 2016-11-04 NOTE — Progress Notes (Signed)
Patient here for lab visit only 

## 2016-11-04 NOTE — Telephone Encounter (Signed)
Pt had apt at 10:00 this a.m.

## 2016-11-05 LAB — BRAIN NATRIURETIC PEPTIDE: BRAIN NATRIURETIC PEPTIDE: 68.5 pg/mL (ref <100)

## 2016-11-07 ENCOUNTER — Other Ambulatory Visit: Payer: Self-pay | Admitting: Internal Medicine

## 2016-11-07 DIAGNOSIS — R600 Localized edema: Secondary | ICD-10-CM

## 2016-11-09 ENCOUNTER — Ambulatory Visit: Payer: Self-pay | Attending: Internal Medicine | Admitting: Internal Medicine

## 2016-11-09 ENCOUNTER — Encounter: Payer: Self-pay | Admitting: Internal Medicine

## 2016-11-09 VITALS — BP 186/93 | HR 100 | Temp 97.9°F | Resp 16 | Wt 184.4 lb

## 2016-11-09 DIAGNOSIS — Z794 Long term (current) use of insulin: Secondary | ICD-10-CM | POA: Insufficient documentation

## 2016-11-09 DIAGNOSIS — N183 Chronic kidney disease, stage 3 unspecified: Secondary | ICD-10-CM

## 2016-11-09 DIAGNOSIS — F419 Anxiety disorder, unspecified: Secondary | ICD-10-CM | POA: Insufficient documentation

## 2016-11-09 DIAGNOSIS — F329 Major depressive disorder, single episode, unspecified: Secondary | ICD-10-CM | POA: Insufficient documentation

## 2016-11-09 DIAGNOSIS — I129 Hypertensive chronic kidney disease with stage 1 through stage 4 chronic kidney disease, or unspecified chronic kidney disease: Secondary | ICD-10-CM | POA: Insufficient documentation

## 2016-11-09 DIAGNOSIS — E1121 Type 2 diabetes mellitus with diabetic nephropathy: Secondary | ICD-10-CM | POA: Insufficient documentation

## 2016-11-09 DIAGNOSIS — F418 Other specified anxiety disorders: Secondary | ICD-10-CM

## 2016-11-09 DIAGNOSIS — E669 Obesity, unspecified: Secondary | ICD-10-CM | POA: Insufficient documentation

## 2016-11-09 DIAGNOSIS — F32A Depression, unspecified: Secondary | ICD-10-CM

## 2016-11-09 DIAGNOSIS — I10 Essential (primary) hypertension: Secondary | ICD-10-CM

## 2016-11-09 DIAGNOSIS — Z7982 Long term (current) use of aspirin: Secondary | ICD-10-CM | POA: Insufficient documentation

## 2016-11-09 DIAGNOSIS — R600 Localized edema: Secondary | ICD-10-CM | POA: Insufficient documentation

## 2016-11-09 DIAGNOSIS — E1122 Type 2 diabetes mellitus with diabetic chronic kidney disease: Secondary | ICD-10-CM | POA: Insufficient documentation

## 2016-11-09 LAB — GLUCOSE, POCT (MANUAL RESULT ENTRY): POC GLUCOSE: 100 mg/dL — AB (ref 70–99)

## 2016-11-09 MED ORDER — LISINOPRIL 5 MG PO TABS: 2.5000 mg | ORAL_TABLET | Freq: Every day | ORAL | 3 refills | Status: DC

## 2016-11-09 MED ORDER — ESCITALOPRAM OXALATE 20 MG PO TABS: 20.0000 mg | ORAL_TABLET | Freq: Every day | ORAL | 2 refills | Status: DC

## 2016-11-09 MED ORDER — ISOSORBIDE MONONITRATE ER 60 MG PO TB24: 60.0000 mg | ORAL_TABLET | Freq: Every day | ORAL | 2 refills | Status: DC

## 2016-11-09 MED FILL — ISOSORBIDE MN ER 60 MG TAB: 60 | 30 days supply | Qty: 30 | Fill #0

## 2016-11-09 MED FILL — LISINOPRIL 5 MG TABLET: 5 | 45 days supply | Qty: 90 | Fill #0

## 2016-11-09 MED FILL — ?ESCITALOPRAM 20 MG TABLET: 20 | 30 days supply | Qty: 30 | Fill #0

## 2016-11-09 MED FILL — METOPROLOL TARTRATE 25 MG T: 25 | 30 days supply | Qty: 60 | Fill #0

## 2016-11-09 NOTE — Patient Instructions (Signed)
Chronic Kidney Disease, Adult Chronic kidney disease (CKD) happens when the kidneys are damaged during a time of 3 or more months. The kidneys are two organs that do many important jobs in the body. These jobs include:  Removing wastes and extra fluids from the blood.  Making hormones that maintain the amount of fluid in your tissues and blood vessels.  Making sure that the body has the right amount of fluids and chemicals. Most of the time, this condition does not go away, but it can usually be controlled. Steps must be taken to slow down the kidney damage or stop it from getting worse. Otherwise, the kidneys may stop working. Follow these instructions at home:  Follow your diet as told by your doctor. You may need to avoid alcohol, salty foods (sodium), and foods that are high in potassium, calcium, and protein.  Take over-the-counter and prescription medicines only as told by your doctor. Do not take any new medicines unless your doctor says you can do that. These include vitamins and minerals.  Medicines and nutritional supplements can make kidney damage worse.  Your doctor may need to change how much medicine you take.  Do not use any tobacco products. These include cigarettes, chewing tobacco, and e-cigarettes. If you need help quitting, ask your doctor.  Keep all follow-up visits as told by your doctor. This is important.  Check your blood pressure. Tell your doctor if there are changes to your blood pressure.  Get to a healthy weight. Stay at that weight. If you need help with this, ask your doctor.  Start or continue an exercise plan. Try to exercise at least 30 minutes a day, 5 days a week.  Stay up-to-date with your shots (immunizations) as told by your doctor. Contact a doctor if:  Your symptoms get worse.  You have new symptoms. Get help right away if:  You have symptoms of end-stage kidney disease. These include:  Headaches.  Skin that is darker or lighter than  normal.  Numbness in your hands or feet.  Easy bruising.  Having hiccups often.  Chest pain.  Shortness of breath.  Stopping of menstrual periods in women.  You have a fever.  You are making very little pee (urine).  You have pain or bleeding when you pee (urinate). This information is not intended to replace advice given to you by your health care provider. Make sure you discuss any questions you have with your health care provider. Document Released: 12/23/2009 Document Revised: 03/05/2016 Document Reviewed: 05/27/2012 Elsevier Interactive Patient Education  2017 [place] DASH stands for "Dietary Approaches to Stop Hypertension." The DASH eating plan is a healthy eating plan that has been shown to reduce high blood pressure (hypertension). Additional health benefits may include reducing the risk of type 2 diabetes mellitus, heart disease, and stroke. The DASH eating plan may also help with weight loss. What do I need to know about the DASH eating plan? For the DASH eating plan, you will follow these general guidelines:  Choose foods with less than 150 milligrams of sodium per serving (as listed on the food label).  Use salt-free seasonings or herbs instead of table salt or sea salt.  Check with your health care provider or pharmacist before using salt substitutes.  Eat lower-sodium products. These are often labeled as "low-sodium" or "no salt added."  Eat fresh foods. Avoid eating a lot of canned foods.  Eat more vegetables, fruits, and low-fat dairy products.  Choose whole grains. Look for the word "whole" as the first word in the ingredient list.  Choose fish and skinless chicken or [place] more often than red meat. Limit fish, poultry, and meat to 6 oz (170 g) each day.  Limit sweets, desserts, sugars, and sugary drinks.  Choose heart-healthy fats.  Eat more home-cooked food and less restaurant, buffet, and fast food.  Limit fried  foods.  Do not fry foods. Cook foods using methods such as baking, boiling, grilling, and broiling instead.  When eating at a restaurant, ask that your food be prepared with less salt, or no salt if possible. What foods can I eat? Seek help from a dietitian for individual calorie needs. Grains  Whole grain or whole wheat bread. Brown rice. Whole grain or whole wheat pasta. Quinoa, bulgur, and whole grain cereals. Low-sodium cereals. Corn or whole wheat flour tortillas. Whole grain cornbread. Whole grain crackers. Low-sodium crackers. Vegetables  Fresh or frozen vegetables (raw, steamed, roasted, or grilled). Low-sodium or reduced-sodium tomato and vegetable juices. Low-sodium or reduced-sodium tomato sauce and paste. Low-sodium or reduced-sodium canned vegetables. Fruits  All fresh, canned (in natural juice), or frozen fruits. Meat and Other Protein Products  Ground beef (85% or leaner), grass-fed beef, or beef trimmed of fat. Skinless chicken or [place]. Ground chicken or [place]. Pork trimmed of fat. All fish and seafood. Eggs. Dried beans, peas, or lentils. Unsalted nuts and seeds. Unsalted canned beans. Dairy  Low-fat dairy products, such as skim or 1% milk, 2% or reduced-fat cheeses, low-fat ricotta or cottage cheese, or plain low-fat yogurt. Low-sodium or reduced-sodium cheeses. Fats and Oils  Tub margarines without trans fats. Light or reduced-fat mayonnaise and salad dressings (reduced sodium). Avocado. Safflower, olive, or canola oils. Natural peanut or almond butter. Other  Unsalted popcorn and pretzels. The items listed above may not be a complete list of recommended foods or beverages. Contact your dietitian for more options.  What foods are not recommended? Grains  White bread. White pasta. White rice. Refined cornbread. Bagels and croissants. Crackers that contain trans fat. Vegetables  Creamed or fried vegetables. Vegetables in a cheese sauce. Regular canned vegetables. Regular  canned tomato sauce and paste. Regular tomato and vegetable juices. Fruits  Canned fruit in light or heavy syrup. Fruit juice. Meat and Other Protein Products  Fatty cuts of meat. Ribs, chicken wings, bacon, sausage, bologna, salami, chitterlings, fatback, hot dogs, bratwurst, and packaged luncheon meats. Salted nuts and seeds. Canned beans with salt. Dairy  Whole or 2% milk, cream, half-and-half, and cream cheese. Whole-fat or sweetened yogurt. Full-fat cheeses or blue cheese. Nondairy creamers and whipped toppings. Processed cheese, cheese spreads, or cheese curds. Condiments  Onion and garlic salt, seasoned salt, table salt, and sea salt. Canned and packaged gravies. Worcestershire sauce. Tartar sauce. Barbecue sauce. Teriyaki sauce. Soy sauce, including reduced sodium. Steak sauce. Fish sauce. Oyster sauce. Cocktail sauce. Horseradish. Ketchup and mustard. Meat flavorings and tenderizers. Bouillon cubes. Hot sauce. Tabasco sauce. Marinades. Taco seasonings. Relishes. Fats and Oils  Butter, stick margarine, lard, shortening, ghee, and bacon fat. Coconut, palm kernel, or palm oils. Regular salad dressings. Other  Pickles and olives. Salted popcorn and pretzels. The items listed above may not be a complete list of foods and beverages to avoid. Contact your dietitian for more information.  Where can I find more information? National Heart, Lung, and Blood Institute: travelstabloid.com This information is not intended to replace advice given to you by your health care provider. Make sure you  discuss any questions you have with your health care provider. Document Released: 09/17/2011 Document Revised: 03/05/2016 Document Reviewed: 08/02/2013 Elsevier Interactive Patient Education  2017 [employer].

## 2016-11-09 NOTE — Progress Notes (Signed)
Patricia [name], is a 61 y.o. female  GDJ:[id number]  [id number]:[id number]  DOB - [birth year]  Chief Complaint  Patient presents with  . Edema        Subjective:   Patricia [name] is a 61 y.o. female here today for a follow up visit for bilat pedal edema and htn.  Of note, when I last saw her, she stopped taking lisinopril, norvasc and metoprolol. Was only taking imdur 30 and lasix 84m po x 4 days.  She is wearing her ted hose and it is helping some. Her bilat leg swelling is down somewhat to below her knees now.  She has been extremely stressed out w/ family issues lately and has not been able to sleep well.   Patient has No headache, No chest pain, No abdominal pain - No Nausea, No new weakness tingling or numbness, No Cough - SOB.  No problems updated.  ALLERGIES: Allergies  Allergen Reactions  . Codone [Hydrocodone] Itching  . Hydralazine Swelling    legs  . Norvasc [Amlodipine Besylate] Swelling    Lower extremity?  . Sulfa Antibiotics Itching and Rash    PAST MEDICAL HISTORY: Past Medical History:  Diagnosis Date  . Diabetes mellitus without complication ([place]   . Hypertension   . Obesity     MEDICATIONS AT HOME: Prior to Admission medications   Medication Sig Start Date End Date Taking? Authorizing Provider  alum & mag hydroxide-simeth (MAALOX/MYLANTA) 200-200-20 MG/5ML suspension Take 15 mLs by mouth every 6 (six) hours as needed for indigestion or heartburn.    Historical Provider, MD  aspirin EC 81 MG tablet Take 1 tablet (81 mg total) by mouth daily. 09/29/16   DMaren Reamer MD  atorvastatin (LIPITOR) 20 MG tablet Take 0.5 tablets (10 mg total) by mouth daily. 09/29/16   DMaren Reamer MD  Blood Glucose Monitoring Suppl (TRUE METRIX METER) w/Device KIT Use as directed 09/08/16   DMaren Reamer MD  dicyclomine (BENTYL) 20 MG tablet Take 1 tablet (20 mg total) by mouth 4 (four) times daily -  before meals and at bedtime. 11/01/16   CDuffy Bruce MD    escitalopram (LEXAPRO) 20 MG tablet Take 1 tablet (20 mg total) by mouth at bedtime. 11/09/16   DMaren Reamer MD  furosemide (LASIX) 20 MG tablet Take 1 tablet (20 mg total) by mouth daily. 11/04/16   DMaren Reamer MD  gabapentin (NEURONTIN) 100 MG capsule Take 1 capsule (100 mg total) by mouth at bedtime. 09/29/16   DMaren Reamer MD  glipiZIDE (GLUCOTROL) 10 MG tablet TAKE 1 TABLET BY MOUTH 2 TIMES DAILY BEFORE A MEAL. 09/29/16   DMaren Reamer MD  glucose blood (TRUE METRIX BLOOD GLUCOSE TEST) test strip Use as instructed 09/08/16   DMaren Reamer MD  HYDROcodone-acetaminophen (NORCO/VICODIN) 5-325 MG tablet Take 1-2 tablets by mouth every 4 (four) hours as needed for severe pain. 11/01/16   CDuffy Bruce MD  insulin aspart (NOVOLOG) 100 UNIT/ML injection 10 units before meals; Unless eating small meal, Inject 8 units prior.  Do not inject if not eating. 09/29/16   DMaren Reamer MD  Insulin Glargine (LANTUS SOLOSTAR) 100 UNIT/ML Solostar Pen Inject 40 Units into the skin 2 (two) times daily. 09/29/16   DMaren Reamer MD  Insulin Pen Needle 29G X 12.7MM MISC 10 Units by Does not apply route 1 day or 1 dose. 01/28/15   VLance Bosch NP  Insulin Syringe-Needle U-100 30G X 5/16" 1  ML MISC Test 3 times daily 05/29/15   Tresa Garter, MD  isosorbide mononitrate (IMDUR) 60 MG 24 hr tablet Take 1 tablet (60 mg total) by mouth daily. 11/09/16   Maren Reamer, MD  lisinopril (PRINIVIL,ZESTRIL) 5 MG tablet Take 0.5 tablets (2.5 mg total) by mouth daily. 11/09/16   Maren Reamer, MD  metoprolol tartrate (LOPRESSOR) 25 MG tablet Take 1 tablet (25 mg total) by mouth 2 (two) times daily. 10/21/16   Maren Reamer, MD  omeprazole (PRILOSEC) 20 MG capsule Take 1 capsule (20 mg total) by mouth daily. 11/01/16 11/15/16  Duffy Bruce, MD  ondansetron (ZOFRAN) 4 MG tablet Take 1 tablet (4 mg total) by mouth every 8 (eight) hours as needed for nausea or vomiting. 11/01/16   Duffy Bruce, MD  TRUEPLUS LANCETS 28G MISC Use as directed 09/08/16   Maren Reamer, MD     Objective:   Vitals:   11/09/16 1553  BP: (!) 186/93  Pulse: 100  Resp: 16  Temp: 97.9 F (36.6 C)  TempSrc: Oral  SpO2: 100%  Weight: 184 lb 6.4 oz (83.6 kg)    Exam General appearance : Awake, alert, not in any distress. Speech Clear. Not toxic looking, pleasant HEENT: Atraumatic and Normocephalic, pupils equally reactive to light. Neck: supple, no JVD.  Chest:Good air entry bilaterally, no added sounds. CVS: S1 S2 regular, no murmurs/gallups or rubs. Abdomen: Bowel sounds active, Non tender  Extremities: B/L Lower Ext shows tight edema to bilat calves, decreased compared to last week (which was above the knee), neg Homan's sign.   both legs are warm to touch.  Wearing bilat ted hose. Neurology: Awake alert, and oriented X 3, CN II-XII grossly intact, Non focal Skin:No Rash  Data Review Lab Results  Component Value Date   HGBA1C 10.1 10/21/2016   HGBA1C 13.7 08/10/2016   HGBA1C 14.4 04/28/2016    Depression screen PHQ 2/9 10/21/2016 09/29/2016 08/10/2016 04/28/2016 11/06/2015  Decreased Interest 2 2 1 3 2   Down, Depressed, Hopeless 2 2 2 3 2   PHQ - 2 Score 4 4 3 6 4   Altered sleeping 0 3 1 2 1   Tired, decreased energy 1 2 3 3 3   Change in appetite 1 2 1 2 2   Feeling bad or failure about yourself  1 3 2 3 2   Trouble concentrating 0 2 1 2 2   Moving slowly or fidgety/restless 0 1 1 1 1   Suicidal thoughts 0 0 0 0 0  PHQ-9 Score 7 17 12 19 15   Difficult doing work/chores - - - Very difficult Somewhat difficult      Assessment & Plan   1. HTN (hypertension), malignant Uncontrolled, difficult due to her renal function and numerous drug insensitivities - advised her to resume lisinopril 2.5 mg qd for now - advised her to resume metoprolol 25 bid - increased indum to 60 qd - unable to tol hydralazine due to le edema, Norvasc was dc'd last week due to concerns of LE edema as  well - but edema persist, so lest likely due to Norvasc.  Cannot increase bb due to n/v/gi sxs/  2. CKD (chronic kidney disease) stage 3, GFR 30-59 ml/min w/ pedal edema - renal function worsened w/ recent bmp, cr 1.8 --> 2 - cautious use of acei for now until renal function improves - Ambulatory referral to Nephrology  3. Pedal edema Borderline elevated ddimer recently - bilat le dopplers pending to r/o dvt, scheduled for tomorw. -  continue ted hose - low salt diet restriction.  4. Anxiety and depression May be worsening her htn.  Denies si/hi/avh. - increase lexapro from 10 to 20 qhs for now - escitalopram (LEXAPRO) 20 MG tablet; Take 1 tablet (20 mg total) by mouth at bedtime.  Dispense: 30 tablet; Refill: 2  5. Diabetic nephropathy associated with type 2 diabetes mellitus ([place]) - POCT glucose (manual entry)     Patient have been counseled extensively about nutrition and exercise  Return in about 1 week (around 11/16/2016) for add on next week for me /wed prefered.  The patient was given clear instructions to go to ER or return to medical center if symptoms don't improve, worsen or new problems develop. The patient verbalized understanding. The patient was told to call to get lab results if they haven't heard anything in the next week.   This note has been created with Surveyor, quantity. Any transcriptional errors are unintentional.   Maren Reamer, MD, [place] [facility] [place], [place]   11/09/2016, 5:18 PM

## 2016-11-10 ENCOUNTER — Ambulatory Visit (HOSPITAL_COMMUNITY)
Admission: RE | Admit: 2016-11-10 | Discharge: 2016-11-10 | Disposition: A | Discharge status: Home / Self Care | Payer: Self-pay | Source: Ambulatory Visit | Attending: Internal Medicine | Admitting: Internal Medicine

## 2016-11-10 DIAGNOSIS — M79604 Pain in right leg: Secondary | ICD-10-CM | POA: Insufficient documentation

## 2016-11-10 DIAGNOSIS — M79605 Pain in left leg: Secondary | ICD-10-CM | POA: Insufficient documentation

## 2016-11-10 DIAGNOSIS — R600 Localized edema: Secondary | ICD-10-CM | POA: Insufficient documentation

## 2016-11-10 NOTE — Progress Notes (Signed)
VASCULAR LAB PRELIMINARY  PRELIMINARY  PRELIMINARY  PRELIMINARY  Bilateral lower extremity venous duplex completed.    Preliminary report:  Bilateral:  No evidence of DVT, superficial thrombosis, or Baker's Cyst.   Katiejo Gilroy, RVS 11/10/2016, 3:16 PM

## 2016-11-12 ENCOUNTER — Telehealth: Payer: Self-pay

## 2016-11-12 DIAGNOSIS — I429 Cardiomyopathy, unspecified: Secondary | ICD-10-CM

## 2016-11-12 HISTORY — DX: Cardiomyopathy, unspecified: I42.9

## 2016-11-12 MED FILL — !LANTUS SOLOSTAR 100UNITS/M: 100 | 7 days supply | Qty: 6 | Fill #1

## 2016-11-12 NOTE — Telephone Encounter (Signed)
Contacted pt to go over doppler results pt is aware of results and doesn't have any questions or concerns. Pt is schedule for Nov 16, 2016 @3pm  for a follow up appointment

## 2016-11-16 ENCOUNTER — Encounter: Payer: Self-pay | Admitting: Internal Medicine

## 2016-11-16 ENCOUNTER — Ambulatory Visit: Payer: Self-pay | Attending: Internal Medicine | Admitting: Internal Medicine

## 2016-11-16 VITALS — BP 168/80 | HR 82 | Temp 98.1°F | Resp 16 | Wt 188.2 lb

## 2016-11-16 DIAGNOSIS — E669 Obesity, unspecified: Secondary | ICD-10-CM | POA: Insufficient documentation

## 2016-11-16 DIAGNOSIS — E1121 Type 2 diabetes mellitus with diabetic nephropathy: Secondary | ICD-10-CM | POA: Insufficient documentation

## 2016-11-16 DIAGNOSIS — F329 Major depressive disorder, single episode, unspecified: Secondary | ICD-10-CM | POA: Insufficient documentation

## 2016-11-16 DIAGNOSIS — E1122 Type 2 diabetes mellitus with diabetic chronic kidney disease: Secondary | ICD-10-CM | POA: Insufficient documentation

## 2016-11-16 DIAGNOSIS — Z7982 Long term (current) use of aspirin: Secondary | ICD-10-CM | POA: Insufficient documentation

## 2016-11-16 DIAGNOSIS — F419 Anxiety disorder, unspecified: Secondary | ICD-10-CM | POA: Insufficient documentation

## 2016-11-16 DIAGNOSIS — F32A Depression, unspecified: Secondary | ICD-10-CM

## 2016-11-16 DIAGNOSIS — N184 Chronic kidney disease, stage 4 (severe): Secondary | ICD-10-CM | POA: Insufficient documentation

## 2016-11-16 DIAGNOSIS — I151 Hypertension secondary to other renal disorders: Secondary | ICD-10-CM

## 2016-11-16 DIAGNOSIS — N2889 Other specified disorders of kidney and ureter: Secondary | ICD-10-CM

## 2016-11-16 DIAGNOSIS — R600 Localized edema: Secondary | ICD-10-CM | POA: Insufficient documentation

## 2016-11-16 DIAGNOSIS — I129 Hypertensive chronic kidney disease with stage 1 through stage 4 chronic kidney disease, or unspecified chronic kidney disease: Secondary | ICD-10-CM | POA: Insufficient documentation

## 2016-11-16 DIAGNOSIS — Z794 Long term (current) use of insulin: Secondary | ICD-10-CM | POA: Insufficient documentation

## 2016-11-16 LAB — BASIC METABOLIC PANEL
BUN: 33 mg/dL — ABNORMAL HIGH (ref 7–25)
CALCIUM: 7.9 mg/dL — AB (ref 8.6–10.4)
CHLORIDE: 112 mmol/L — AB (ref 98–110)
CO2: 23 mmol/L (ref 20–31)
CREATININE: 2.02 mg/dL — AB (ref 0.50–1.05)
Glucose, Bld: 144 mg/dL — ABNORMAL HIGH (ref 65–99)
Potassium: 3.9 mmol/L (ref 3.5–5.3)
Sodium: 143 mmol/L (ref 135–146)

## 2016-11-16 LAB — GLUCOSE, POCT (MANUAL RESULT ENTRY): POC GLUCOSE: 166 mg/dL — AB (ref 70–99)

## 2016-11-16 NOTE — Progress Notes (Signed)
Patricia [name], is a 61 y.o. female  VPX:[id number]  IOE:[id number]  DOB - 02-17-1956  Chief Complaint  Patient presents with  . Follow-up        Subjective:   Patricia [name] is a 61 y.o. 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 She has been taking all meds as prescribed (lisinopril 2.5, metoprolol 25bid, imdur 60 qd) without problems. Her swelling is slightly lower as well. Wearing her ted hose during day.  Of note, she checked her blood pressure at her Dgt on Friday, and it was 136/82.  Of note, her depression/anxiety is much better on higher dose of lexapro 20.  Has been able to sleep better and relax more. Denies si/hi/avh.   Patient has No headache, No chest pain, No abdominal pain - No Nausea, No new weakness tingling or numbness, No Cough - SOB.  No problems updated.  ALLERGIES: Allergies  Allergen Reactions  . Codone [Hydrocodone] Itching  . Hydralazine Swelling    legs  . Norvasc [Amlodipine Besylate] Swelling    Lower extremity?  . Sulfa Antibiotics Itching and Rash    PAST MEDICAL HISTORY: Past Medical History:  Diagnosis Date  . Diabetes mellitus without complication ([place])   . Hypertension   . Obesity     MEDICATIONS AT HOME: Prior to Admission medications   Medication Sig Start Date End Date Taking? Authorizing Provider  alum & mag hydroxide-simeth (MAALOX/MYLANTA) 200-200-20 MG/5ML suspension Take 15 mLs by mouth every 6 (six) hours as needed for indigestion or heartburn.    Historical Provider, MD  aspirin EC 81 MG tablet Take 1 tablet (81 mg total) by mouth daily. 09/29/16   Maren Reamer, MD  atorvastatin (LIPITOR) 20 MG tablet Take 0.5 tablets (10 mg total) by mouth daily. 09/29/16   Maren Reamer, MD  Blood Glucose Monitoring Suppl (TRUE METRIX METER) w/Device KIT Use as directed 09/08/16   Maren Reamer, MD  dicyclomine (BENTYL) 20 MG tablet Take 1 tablet (20 mg total) by mouth 4 (four)  times daily -  before meals and at bedtime. 11/01/16   Duffy Bruce, MD  escitalopram (LEXAPRO) 20 MG tablet Take 1 tablet (20 mg total) by mouth at bedtime. 11/09/16   Maren Reamer, MD  furosemide (LASIX) 20 MG tablet Take 1 tablet (20 mg total) by mouth daily. 11/04/16   Maren Reamer, MD  gabapentin (NEURONTIN) 100 MG capsule Take 1 capsule (100 mg total) by mouth at bedtime. 09/29/16   Maren Reamer, MD  glipiZIDE (GLUCOTROL) 10 MG tablet TAKE 1 TABLET BY MOUTH 2 TIMES DAILY BEFORE A MEAL. 09/29/16   Maren Reamer, MD  glucose blood (TRUE METRIX BLOOD GLUCOSE TEST) test strip Use as instructed 09/08/16   Maren Reamer, MD  HYDROcodone-acetaminophen (NORCO/VICODIN) 5-325 MG tablet Take 1-2 tablets by mouth every 4 (four) hours as needed for severe pain. 11/01/16   Duffy Bruce, MD  insulin aspart (NOVOLOG) 100 UNIT/ML injection 10 units before meals; Unless eating small meal, Inject 8 units prior.  Do not inject if not eating. 09/29/16   Maren Reamer, MD  Insulin Glargine (LANTUS SOLOSTAR) 100 UNIT/ML Solostar Pen Inject 40 Units into the skin 2 (two) times daily. 09/29/16   Maren Reamer, MD  Insulin Pen Needle 29G X 12.7MM MISC 10 Units by Does not apply route 1 day or 1 dose. 01/28/15   Lance Bosch, NP  Insulin Syringe-Needle U-100 30G X 5/16" 1 ML MISC Test 3 times daily 05/29/15   Tresa Garter, MD  isosorbide mononitrate (IMDUR) 60 MG 24 hr tablet Take 1 tablet (60 mg total) by mouth daily. 11/09/16   Maren Reamer, MD  lisinopril (PRINIVIL,ZESTRIL) 5 MG tablet Take 0.5 tablets (2.5 mg total) by mouth daily. 11/09/16   Maren Reamer, MD  metoprolol tartrate (LOPRESSOR) 25 MG tablet Take 1 tablet (25 mg total) by mouth 2 (two) times daily. 10/21/16   Maren Reamer, MD  omeprazole (PRILOSEC) 20 MG capsule Take 1 capsule (20 mg total) by mouth daily. 11/01/16 11/15/16  Duffy Bruce, MD  ondansetron (ZOFRAN) 4 MG tablet Take 1 tablet (4 mg total) by mouth every  8 (eight) hours as needed for nausea or vomiting. 11/01/16   Duffy Bruce, MD  TRUEPLUS LANCETS 28G MISC Use as directed 09/08/16   Maren Reamer, MD     Objective:   Vitals:   11/16/16 1456  BP: (!) 196/65  Pulse: 82  Resp: 16  Temp: 98.1 F (36.7 C)  TempSrc: Oral  SpO2: 98%  Weight: 188 lb 3.2 oz (85.4 kg)   Repeat bp 168/80  Exam General appearance : Awake, alert, not in any distress. Speech Clear. Not toxic looking, good spirits. HEENT: Atraumatic and Normocephalic, pupils equally reactive to light. Neck: supple, no JVD.  Chest:Good air entry bilaterally, no added sounds. CVS: S1 S2 regular, no murmurs/gallups or rubs. Abdomen: Bowel sounds active, obese, Non tender and not distended Extremities: B/L Lower Ext shows 1+ edema bilat to mid calves, wearing ted hose bilat, Neg Homan's sign, both legs are warm to touch Neurology: Awake alert, and oriented X 3, CN II-XII grossly intact, Non focal Skin:No Rash  Data Review Lab Results  Component Value Date   HGBA1C 10.1 10/21/2016   HGBA1C 13.7 08/10/2016   HGBA1C 14.4 04/28/2016    Depression screen PHQ 2/9 10/21/2016 09/29/2016 08/10/2016 04/28/2016 11/06/2015  Decreased Interest _0 Down, Depressed, Hopeless _1 PHQ - 2 Score _2 Altered sleeping 0 _3 Tired, decreased energy _4 Change in appetite _5 Feeling bad or failure about yourself  _6 Trouble concentrating 0 _7 Moving slowly or fidgety/restless 0 _8 Suicidal thoughts 0 0 0 0 0  PHQ-9 Score _9 Difficult doing work/chores - - - Very difficult Somewhat difficult      Assessment & Plan   1. Diabetic nephropathy associated with type 2 diabetes mellitus ([place]) - POCT glucose (manual entry) - rechk bmp today  2. 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  3. Bilateral lower extremity edema 2nd to ckd, no dvt noted on recent  dopplers.   4. Depression, unspecified depression type Doing much better on higher dose. Continue lexapro 20 qhs.     Patient have been counseled extensively about nutrition and exercise  Return in about 1 week (around 11/23/2016) for bp check / add on for me.  The patient was given clear instructions to go to ER or return to medical center if symptoms don't improve, worsen or new problems develop. The patient verbalized understanding. The patient was told to call to get lab results  if they haven't heard anything in the next week.   This note has been created with Surveyor, quantity. Any transcriptional errors are unintentional.   Maren Reamer, MD, [place] and [facility] [place], [place]   11/16/2016, 3:12 PM

## 2016-11-16 NOTE — Patient Instructions (Signed)
bp check    -  Chronic Kidney Disease, Adult Chronic kidney disease (CKD) happens when the kidneys are damaged during a time of 3 or more months. The kidneys are two organs that do many important jobs in the body. These jobs include:  Removing wastes and extra fluids from the blood.  Making hormones that maintain the amount of fluid in your tissues and blood vessels.  Making sure that the body has the right amount of fluids and chemicals. Most of the time, this condition does not go away, but it can usually be controlled. Steps must be taken to slow down the kidney damage or stop it from getting worse. Otherwise, the kidneys may stop working. Follow these instructions at home:  Follow your diet as told by your doctor. You may need to avoid alcohol, salty foods (sodium), and foods that are high in potassium, calcium, and protein.  Take over-the-counter and prescription medicines only as told by your doctor. Do not take any new medicines unless your doctor says you can do that. These include vitamins and minerals.  Medicines and nutritional supplements can make kidney damage worse.  Your doctor may need to change how much medicine you take.  Do not use any tobacco products. These include cigarettes, chewing tobacco, and e-cigarettes. If you need help quitting, ask your doctor.  Keep all follow-up visits as told by your doctor. This is important.  Check your blood pressure. Tell your doctor if there are changes to your blood pressure.  Get to a healthy weight. Stay at that weight. If you need help with this, ask your doctor.  Start or continue an exercise plan. Try to exercise at least 30 minutes a day, 5 days a week.  Stay up-to-date with your shots (immunizations) as told by your doctor. Contact a doctor if:  Your symptoms get worse.  You have new symptoms. Get help right away if:  You have symptoms of end-stage kidney disease. These include:  Headaches.  Skin that is  darker or lighter than normal.  Numbness in your hands or feet.  Easy bruising.  Having hiccups often.  Chest pain.  Shortness of breath.  Stopping of menstrual periods in women.  You have a fever.  You are making very little pee (urine).  You have pain or bleeding when you pee (urinate). This information is not intended to replace advice given to you by your health care provider. Make sure you discuss any questions you have with your health care provider. Document Released: 12/23/2009 Document Revised: 03/05/2016 Document Reviewed: 05/27/2012 Elsevier Interactive Patient Education  2017 [employer] Inc[employer]  -  Edema Edema is an abnormal buildup of fluids. It is more common in your legs and thighs. Painless swelling of the feet and ankles is more likely as a person ages. It also is common in looser skin, like around your eyes. Follow these instructions at home:  Keep the affected body part above the level of the heart while lying down.  Do not sit still or stand for a long time.  Do not put anything right under your knees when you lie down.  Do not wear tight clothes on your upper legs.  Exercise your legs to help the puffiness (swelling) go down.  Wear elastic bandages or support stockings as told by your doctor.  A low-salt diet may help lessen the puffiness.  Only take medicine as told by your doctor. Contact a doctor if:  Treatment is not working.  You have heart,  liver, or kidney disease and notice that your skin looks puffy or shiny.  You have puffiness in your legs that does not get better when you raise your legs.  You have sudden weight gain for no reason. Get help right away if:  You have shortness of breath or chest pain.  You cannot breathe when you lie down.  You have pain, redness, or warmth in the areas that are puffy.  You have heart, liver, or kidney disease and get edema all of a sudden.  You have a fever and your symptoms get worse all of a  sudden. This information is not intended to replace advice given to you by your health care provider. Make sure you discuss any questions you have with your health care provider. Document Released: 03/16/2008 Document Revised: 03/05/2016 Document Reviewed: 07/21/2013 Elsevier Interactive Patient Education  2017 [employer].

## 2016-11-19 ENCOUNTER — Telehealth: Payer: Self-pay

## 2016-11-19 NOTE — Telephone Encounter (Signed)
Contacted pt to go over lab results pt is aware and doesn't have any questions or concerns 

## 2016-11-20 ENCOUNTER — Ambulatory Visit: Payer: Self-pay | Attending: Internal Medicine

## 2016-11-23 ENCOUNTER — Ambulatory Visit: Payer: Self-pay | Admitting: Internal Medicine

## 2016-11-23 ENCOUNTER — Ambulatory Visit: Payer: Self-pay | Attending: Internal Medicine | Admitting: Internal Medicine

## 2016-11-23 ENCOUNTER — Encounter: Payer: Self-pay | Admitting: Internal Medicine

## 2016-11-23 VITALS — BP 174/94 | HR 78 | Temp 98.0°F | Resp 16 | Wt 188.0 lb

## 2016-11-23 DIAGNOSIS — Z7982 Long term (current) use of aspirin: Secondary | ICD-10-CM | POA: Insufficient documentation

## 2016-11-23 DIAGNOSIS — E1122 Type 2 diabetes mellitus with diabetic chronic kidney disease: Secondary | ICD-10-CM | POA: Insufficient documentation

## 2016-11-23 DIAGNOSIS — E1121 Type 2 diabetes mellitus with diabetic nephropathy: Secondary | ICD-10-CM

## 2016-11-23 DIAGNOSIS — N183 Chronic kidney disease, stage 3 unspecified: Secondary | ICD-10-CM

## 2016-11-23 DIAGNOSIS — I129 Hypertensive chronic kidney disease with stage 1 through stage 4 chronic kidney disease, or unspecified chronic kidney disease: Secondary | ICD-10-CM | POA: Insufficient documentation

## 2016-11-23 DIAGNOSIS — E669 Obesity, unspecified: Secondary | ICD-10-CM | POA: Insufficient documentation

## 2016-11-23 DIAGNOSIS — F419 Anxiety disorder, unspecified: Secondary | ICD-10-CM | POA: Insufficient documentation

## 2016-11-23 DIAGNOSIS — F329 Major depressive disorder, single episode, unspecified: Secondary | ICD-10-CM | POA: Insufficient documentation

## 2016-11-23 DIAGNOSIS — R600 Localized edema: Secondary | ICD-10-CM

## 2016-11-23 DIAGNOSIS — Z794 Long term (current) use of insulin: Secondary | ICD-10-CM | POA: Insufficient documentation

## 2016-11-23 LAB — GLUCOSE, POCT (MANUAL RESULT ENTRY): POC Glucose: 134 mg/dL — AB (ref 70–99)

## 2016-11-23 MED ORDER — LISINOPRIL 5 MG PO TABS: 5.0000 mg | ORAL_TABLET | Freq: Every day | ORAL | 3 refills | Status: DC

## 2016-11-23 NOTE — Progress Notes (Signed)
Patricia [name], is a 61 y.o. female  [id number]  [id number]  DOB - [birth year]  Chief Complaint  Patient presents with  . Hypertension        Subjective:   Patricia [name] is a 61 y.o. female here today for a follow up visit of bilat le edema and ckd.  Swelling down slightly, currently wearing teds hose, and watching her salt intake.  Her depression /anxiety much better now as well lexapro 20 qd.  In good spirits today and feels well.  Patient has No headache, No chest pain, No abdominal pain - No Nausea, No new weakness tingling or numbness, No Cough - SOB.  Here w/ her dgt and young granddgt.  No problems updated.  ALLERGIES: Allergies  Allergen Reactions  . Codone [Hydrocodone] Itching  . Hydralazine Swelling    legs  . Norvasc [Amlodipine Besylate] Swelling    Lower extremity?  . Sulfa Antibiotics Itching and Rash    PAST MEDICAL HISTORY: Past Medical History:  Diagnosis Date  . Diabetes mellitus without complication ([place])   . Hypertension   . Obesity     MEDICATIONS AT HOME: Prior to Admission medications   Medication Sig Start Date End Date Taking? Authorizing Provider  alum & mag hydroxide-simeth (MAALOX/MYLANTA) 200-200-20 MG/5ML suspension Take 15 mLs by mouth every 6 (six) hours as needed for indigestion or heartburn.    Historical Provider, MD  aspirin EC 81 MG tablet Take 1 tablet (81 mg total) by mouth daily. 09/29/16   Maren Reamer, MD  atorvastatin (LIPITOR) 20 MG tablet Take 0.5 tablets (10 mg total) by mouth daily. 09/29/16   Maren Reamer, MD  Blood Glucose Monitoring Suppl (TRUE METRIX METER) w/Device KIT Use as directed 09/08/16   Maren Reamer, MD  dicyclomine (BENTYL) 20 MG tablet Take 1 tablet (20 mg total) by mouth 4 (four) times daily -  before meals and at bedtime. 11/01/16   Duffy Bruce, MD  escitalopram (LEXAPRO) 20 MG tablet Take 1 tablet (20 mg total) by mouth at bedtime. 11/09/16   Maren Reamer, MD  furosemide (LASIX)  20 MG tablet Take 1 tablet (20 mg total) by mouth daily. 11/04/16   Maren Reamer, MD  gabapentin (NEURONTIN) 100 MG capsule Take 1 capsule (100 mg total) by mouth at bedtime. 09/29/16   Maren Reamer, MD  glipiZIDE (GLUCOTROL) 10 MG tablet TAKE 1 TABLET BY MOUTH 2 TIMES DAILY BEFORE A MEAL. 09/29/16   Maren Reamer, MD  glucose blood (TRUE METRIX BLOOD GLUCOSE TEST) test strip Use as instructed 09/08/16   Maren Reamer, MD  HYDROcodone-acetaminophen (NORCO/VICODIN) 5-325 MG tablet Take 1-2 tablets by mouth every 4 (four) hours as needed for severe pain. 11/01/16   Duffy Bruce, MD  insulin aspart (NOVOLOG) 100 UNIT/ML injection 10 units before meals; Unless eating small meal, Inject 8 units prior.  Do not inject if not eating. 09/29/16   Maren Reamer, MD  Insulin Glargine (LANTUS SOLOSTAR) 100 UNIT/ML Solostar Pen Inject 40 Units into the skin 2 (two) times daily. 09/29/16   Maren Reamer, MD  Insulin Pen Needle 29G X 12.7MM MISC 10 Units by Does not apply route 1 day or 1 dose. 01/28/15   Lance Bosch, NP  Insulin Syringe-Needle U-100 30G X 5/16" 1 ML MISC Test 3 times daily 05/29/15   Tresa Garter, MD  isosorbide mononitrate (IMDUR) 60 MG 24 hr tablet Take 1 tablet (60 mg total) by mouth daily.  11/09/16   Maren Reamer, MD  lisinopril (PRINIVIL,ZESTRIL) 5 MG tablet Take 1 tablet (5 mg total) by mouth daily. 11/23/16   Maren Reamer, MD  metoprolol tartrate (LOPRESSOR) 25 MG tablet Take 1 tablet (25 mg total) by mouth 2 (two) times daily. 10/21/16   Maren Reamer, MD  omeprazole (PRILOSEC) 20 MG capsule Take 1 capsule (20 mg total) by mouth daily. 11/01/16 11/15/16  Duffy Bruce, MD  ondansetron (ZOFRAN) 4 MG tablet Take 1 tablet (4 mg total) by mouth every 8 (eight) hours as needed for nausea or vomiting. 11/01/16   Duffy Bruce, MD  TRUEPLUS LANCETS 28G MISC Use as directed 09/08/16   Maren Reamer, MD     Objective:   Vitals:   11/23/16 1413  BP: (!)  174/94  Pulse: 78  Resp: 16  Temp: 98 F (36.7 C)  TempSrc: Oral  SpO2: 97%  Weight: 188 lb (85.3 kg)    Exam General appearance : Awake, alert, not in any distress. Speech Clear. Not toxic looking, pleasant. HEENT: Atraumatic and Normocephalic, pupils equally reactive to light. Neck: supple, no JVD.  Chest:Good air entry bilaterally, no added sounds. CVS: S1 S2 regular, no murmurs/gallups or rubs. Abdomen: Bowel sounds active, Non tender Extremities: B/L Lower Ext shows 1+ edema to below knees, wearing bilat ted hose. both legs are warm to touch Neurology: Awake alert, and oriented X 3, CN II-XII grossly intact, Non focal Skin:No Rash  Data Review Lab Results  Component Value Date   HGBA1C 10.1 10/21/2016   HGBA1C 13.7 08/10/2016   HGBA1C 14.4 04/28/2016    Depression screen PHQ 2/9 11/23/2016 11/16/2016 10/21/2016 09/29/2016 08/10/2016  Decreased Interest 1 2 2 2 1   Down, Depressed, Hopeless 1 2 2 2 2   PHQ - 2 Score 2 4 4 4 3   Altered sleeping 1 0 0 3 1  Tired, decreased energy 2 2 1 2 3   Change in appetite 1 1 1 2 1   Feeling bad or failure about yourself  1 1 1 3 2   Trouble concentrating 2 1 0 2 1  Moving slowly or fidgety/restless 0 1 0 1 1  Suicidal thoughts 0 0 0 0 0  PHQ-9 Score 9 10 7 17 12   Difficult doing work/chores - - - - -      Assessment & Plan   1. 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 Bilateral lower extremity edema Cont bilat ted hose, low salt diet  3. Htn, in setting of ckd 3. - will increase lisinopril to 5 mg qd, check bmp next week. F/u w/ me 11/06/16.  4. Pending orange card approval. Renal cs when get.     Patient have been counseled extensively about nutrition and exercise  Return in about 1 week (around 11/30/2016) for apt w/ me add on 2/26.  The patient was given clear instructions to go to ER or return to medical  center if symptoms don't improve, worsen or new problems develop. The patient verbalized understanding. The patient was told to call to get lab results if they haven't heard anything in the next week.   This note has been created with Surveyor, quantity. Any transcriptional errors are unintentional.   Maren Reamer, MD, [place] and [facility] [place], [place]   11/23/2016, 2:36 PM

## 2016-11-23 NOTE — Patient Instructions (Addendum)
-   lab appointment next week - lab work.  - low salt!!  -  Edema Edema is an abnormal buildup of fluids. It is more common in your legs and thighs. Painless swelling of the feet and ankles is more likely as a person ages. It also is common in looser skin, like around your eyes. Follow these instructions at home:  Keep the affected body part above the level of the heart while lying down.  Do not sit still or stand for a long time.  Do not put anything right under your knees when you lie down.  Do not wear tight clothes on your upper legs.  Exercise your legs to help the puffiness (swelling) go down.  Wear elastic bandages or support stockings as told by your doctor.  A low-salt diet may help lessen the puffiness.  Only take medicine as told by your doctor. Contact a doctor if:  Treatment is not working.  You have heart, liver, or kidney disease and notice that your skin looks puffy or shiny.  You have puffiness in your legs that does not get better when you raise your legs.  You have sudden weight gain for no reason. Get help right away if:  You have shortness of breath or chest pain.  You cannot breathe when you lie down.  You have pain, redness, or warmth in the areas that are puffy.  You have heart, liver, or kidney disease and get edema all of a sudden.  You have a fever and your symptoms get worse all of a sudden. This information is not intended to replace advice given to you by your health care provider. Make sure you discuss any questions you have with your health care provider. Document Released: 03/16/2008 Document Revised: 03/05/2016 Document Reviewed: 07/21/2013 Elsevier Interactive Patient Education  2017 [employer].

## 2016-11-27 ENCOUNTER — Other Ambulatory Visit: Payer: Self-pay | Admitting: *Deleted

## 2016-11-27 MED ORDER — INSULIN GLARGINE 100 UNIT/ML SOLOSTAR PEN: 40.0000 [IU] | PEN_INJECTOR | Freq: Two times a day (BID) | SUBCUTANEOUS | 3 refills | Status: DC

## 2016-11-27 NOTE — Telephone Encounter (Signed)
PRINTED FOR PASS PROGRAM 

## 2016-11-29 ENCOUNTER — Inpatient Hospital Stay (HOSPITAL_COMMUNITY)
Admission: EM | Admit: 2016-11-29 | Discharge: 2016-12-02 | DRG: 291 | Disposition: A | Discharge status: Home / Self Care | Payer: Self-pay | Attending: Internal Medicine | Admitting: Internal Medicine

## 2016-11-29 ENCOUNTER — Encounter (HOSPITAL_COMMUNITY): Payer: Self-pay | Admitting: Emergency Medicine

## 2016-11-29 ENCOUNTER — Emergency Department (HOSPITAL_COMMUNITY): Payer: Self-pay

## 2016-11-29 DIAGNOSIS — D649 Anemia, unspecified: Secondary | ICD-10-CM | POA: Diagnosis present

## 2016-11-29 DIAGNOSIS — E872 Acidosis, unspecified: Secondary | ICD-10-CM

## 2016-11-29 DIAGNOSIS — D631 Anemia in chronic kidney disease: Secondary | ICD-10-CM | POA: Diagnosis present

## 2016-11-29 DIAGNOSIS — N289 Disorder of kidney and ureter, unspecified: Secondary | ICD-10-CM

## 2016-11-29 DIAGNOSIS — E871 Hypo-osmolality and hyponatremia: Secondary | ICD-10-CM | POA: Diagnosis present

## 2016-11-29 DIAGNOSIS — Z833 Family history of diabetes mellitus: Secondary | ICD-10-CM

## 2016-11-29 DIAGNOSIS — R740 Nonspecific elevation of levels of transaminase and lactic acid dehydrogenase [LDH]: Secondary | ICD-10-CM | POA: Diagnosis present

## 2016-11-29 DIAGNOSIS — F419 Anxiety disorder, unspecified: Secondary | ICD-10-CM | POA: Diagnosis present

## 2016-11-29 DIAGNOSIS — D509 Iron deficiency anemia, unspecified: Secondary | ICD-10-CM | POA: Diagnosis present

## 2016-11-29 DIAGNOSIS — Z7982 Long term (current) use of aspirin: Secondary | ICD-10-CM

## 2016-11-29 DIAGNOSIS — I130 Hypertensive heart and chronic kidney disease with heart failure and stage 1 through stage 4 chronic kidney disease, or unspecified chronic kidney disease: Principal | ICD-10-CM | POA: Diagnosis present

## 2016-11-29 DIAGNOSIS — N184 Chronic kidney disease, stage 4 (severe): Secondary | ICD-10-CM | POA: Diagnosis present

## 2016-11-29 DIAGNOSIS — I10 Essential (primary) hypertension: Secondary | ICD-10-CM | POA: Diagnosis present

## 2016-11-29 DIAGNOSIS — I5020 Unspecified systolic (congestive) heart failure: Secondary | ICD-10-CM

## 2016-11-29 DIAGNOSIS — E1122 Type 2 diabetes mellitus with diabetic chronic kidney disease: Secondary | ICD-10-CM | POA: Diagnosis present

## 2016-11-29 DIAGNOSIS — E1121 Type 2 diabetes mellitus with diabetic nephropathy: Secondary | ICD-10-CM | POA: Diagnosis present

## 2016-11-29 DIAGNOSIS — E669 Obesity, unspecified: Secondary | ICD-10-CM | POA: Diagnosis present

## 2016-11-29 DIAGNOSIS — E118 Type 2 diabetes mellitus with unspecified complications: Secondary | ICD-10-CM

## 2016-11-29 DIAGNOSIS — R7401 Elevation of levels of liver transaminase levels: Secondary | ICD-10-CM | POA: Diagnosis present

## 2016-11-29 DIAGNOSIS — E785 Hyperlipidemia, unspecified: Secondary | ICD-10-CM | POA: Diagnosis present

## 2016-11-29 DIAGNOSIS — F329 Major depressive disorder, single episode, unspecified: Secondary | ICD-10-CM | POA: Diagnosis present

## 2016-11-29 DIAGNOSIS — E8809 Other disorders of plasma-protein metabolism, not elsewhere classified: Secondary | ICD-10-CM | POA: Diagnosis present

## 2016-11-29 DIAGNOSIS — F32A Depression, unspecified: Secondary | ICD-10-CM | POA: Diagnosis present

## 2016-11-29 DIAGNOSIS — Z6830 Body mass index (BMI) 30.0-30.9, adult: Secondary | ICD-10-CM

## 2016-11-29 DIAGNOSIS — J9601 Acute respiratory failure with hypoxia: Secondary | ICD-10-CM | POA: Diagnosis present

## 2016-11-29 DIAGNOSIS — E876 Hypokalemia: Secondary | ICD-10-CM | POA: Diagnosis not present

## 2016-11-29 DIAGNOSIS — I5023 Acute on chronic systolic (congestive) heart failure: Secondary | ICD-10-CM | POA: Diagnosis present

## 2016-11-29 DIAGNOSIS — E11649 Type 2 diabetes mellitus with hypoglycemia without coma: Secondary | ICD-10-CM | POA: Diagnosis not present

## 2016-11-29 DIAGNOSIS — I152 Hypertension secondary to endocrine disorders: Secondary | ICD-10-CM | POA: Diagnosis present

## 2016-11-29 DIAGNOSIS — N183 Chronic kidney disease, stage 3 (moderate): Secondary | ICD-10-CM | POA: Diagnosis present

## 2016-11-29 DIAGNOSIS — Z794 Long term (current) use of insulin: Secondary | ICD-10-CM

## 2016-11-29 DIAGNOSIS — Z79899 Other long term (current) drug therapy: Secondary | ICD-10-CM

## 2016-11-29 DIAGNOSIS — J810 Acute pulmonary edema: Secondary | ICD-10-CM

## 2016-11-29 DIAGNOSIS — I429 Cardiomyopathy, unspecified: Secondary | ICD-10-CM | POA: Diagnosis present

## 2016-11-29 DIAGNOSIS — I160 Hypertensive urgency: Secondary | ICD-10-CM | POA: Diagnosis present

## 2016-11-29 DIAGNOSIS — E113299 Type 2 diabetes mellitus with mild nonproliferative diabetic retinopathy without macular edema, unspecified eye: Secondary | ICD-10-CM | POA: Diagnosis present

## 2016-11-29 DIAGNOSIS — Z7984 Long term (current) use of oral hypoglycemic drugs: Secondary | ICD-10-CM

## 2016-11-29 LAB — TROPONIN I
TROPONIN I: 0.03 ng/mL — AB (ref <0.03)
TROPONIN I: 0.03 ng/mL — AB (ref <0.03)
Troponin I: <0.03 ng/mL (ref <0.03)

## 2016-11-29 LAB — COMPREHENSIVE METABOLIC PANEL
ALBUMIN: 2.2 g/dL — AB (ref 3.5–5.0)
ALK PHOS: 112 U/L (ref 38–126)
ALT: 62 U/L — AB (ref 14–54)
AST: 62 U/L — ABNORMAL HIGH (ref 15–41)
Anion gap: 10 (ref 5–15)
BILIRUBIN TOTAL: 0.5 mg/dL (ref 0.3–1.2)
BUN: 38 mg/dL — ABNORMAL HIGH (ref 6–20)
CALCIUM: 8.2 mg/dL — AB (ref 8.9–10.3)
CO2: 17 mmol/L — AB (ref 22–32)
CREATININE: 2.26 mg/dL — AB (ref 0.44–1.00)
Chloride: 106 mmol/L (ref 101–111)
GFR calc Af Amer: 26 mL/min — ABNORMAL LOW (ref >60)
GFR calc non Af Amer: 23 mL/min — ABNORMAL LOW (ref >60)
GLUCOSE: 362 mg/dL — AB (ref 65–99)
Potassium: 4.2 mmol/L (ref 3.5–5.1)
SODIUM: 133 mmol/L — AB (ref 135–145)
TOTAL PROTEIN: 5.4 g/dL — AB (ref 6.5–8.1)

## 2016-11-29 LAB — RETICULOCYTES
RBC.: 3.5 MIL/uL — ABNORMAL LOW (ref 3.87–5.11)
Retic Count, Absolute: 49 10*3/uL (ref 19.0–186.0)
Retic Ct Pct: 1.4 % (ref 0.4–3.1)

## 2016-11-29 LAB — FOLATE: Folate: 19.7 ng/mL (ref >5.9)

## 2016-11-29 LAB — MRSA PCR SCREENING: MRSA by PCR: NEGATIVE

## 2016-11-29 LAB — GLUCOSE, CAPILLARY
GLUCOSE-CAPILLARY: 133 mg/dL — AB (ref 65–99)
GLUCOSE-CAPILLARY: 95 mg/dL (ref 65–99)
Glucose-Capillary: 339 mg/dL — ABNORMAL HIGH (ref 65–99)
Glucose-Capillary: 248 mg/dL — ABNORMAL HIGH (ref 65–99)

## 2016-11-29 LAB — CBC WITH DIFFERENTIAL/PLATELET
BASOS ABS: 0 10*3/uL (ref 0.0–0.1)
BASOS PCT: 0 %
EOS ABS: 0.2 10*3/uL (ref 0.0–0.7)
EOS PCT: 2 %
HCT: 26.4 % — ABNORMAL LOW (ref 36.0–46.0)
Hemoglobin: 8.5 g/dL — ABNORMAL LOW (ref 12.0–15.0)
Lymphocytes Relative: 30 %
Lymphs Abs: 2.5 10*3/uL (ref 0.7–4.0)
MCH: 25.8 pg — ABNORMAL LOW (ref 26.0–34.0)
MCHC: 32.2 g/dL (ref 30.0–36.0)
MCV: 80.2 fL (ref 78.0–100.0)
MONO ABS: 0.3 10*3/uL (ref 0.1–1.0)
MONOS PCT: 3 %
Neutro Abs: 5.4 10*3/uL (ref 1.7–7.7)
Neutrophils Relative %: 65 %
PLATELETS: 291 10*3/uL (ref 150–400)
RBC: 3.29 MIL/uL — ABNORMAL LOW (ref 3.87–5.11)
RDW: 15 % (ref 11.5–15.5)
WBC: 8.3 10*3/uL (ref 4.0–10.5)

## 2016-11-29 LAB — IRON AND TIBC
IRON: 22 ug/dL — AB (ref 28–170)
Saturation Ratios: 6 % — ABNORMAL LOW (ref 10.4–31.8)
TIBC: 386 ug/dL (ref 250–450)
UIBC: 364 ug/dL

## 2016-11-29 LAB — I-STAT TROPONIN, ED: TROPONIN I, POC: 0.02 ng/mL (ref 0.00–0.08)

## 2016-11-29 LAB — BRAIN NATRIURETIC PEPTIDE: B Natriuretic Peptide: 1295.3 pg/mL — ABNORMAL HIGH (ref 0.0–100.0)

## 2016-11-29 LAB — VITAMIN B12: VITAMIN B 12: 373 pg/mL (ref 180–914)

## 2016-11-29 LAB — FERRITIN: FERRITIN: 21 ng/mL (ref 11–307)

## 2016-11-29 MED ORDER — ASPIRIN EC 81 MG PO TBEC
81.0000 mg | DELAYED_RELEASE_TABLET | Freq: Every day | ORAL | Status: DC
  Administered 2016-11-29 – 2016-12-02 (×4): 81 mg via ORAL
  Filled 2016-11-29 (×4): qty 1

## 2016-11-29 MED ORDER — IPRATROPIUM-ALBUTEROL 0.5-2.5 (3) MG/3ML IN SOLN
3.0000 mL | Freq: Once | RESPIRATORY_TRACT | Status: AC
  Administered 2016-11-29: 3 mL via RESPIRATORY_TRACT
  Filled 2016-11-29: qty 3

## 2016-11-29 MED ORDER — ALUM & MAG HYDROXIDE-SIMETH 200-200-20 MG/5ML PO SUSP: 15.0000 mL | Freq: Four times a day (QID) | ORAL | Status: DC | PRN

## 2016-11-29 MED ORDER — INSULIN ASPART 100 UNIT/ML ~~LOC~~ SOLN: 0.0000 [IU] | Freq: Every day | SUBCUTANEOUS | Status: DC

## 2016-11-29 MED ORDER — NITROGLYCERIN IN D5W 200-5 MCG/ML-% IV SOLN: 0.0000 ug/min | INTRAVENOUS | Status: DC

## 2016-11-29 MED ORDER — ESCITALOPRAM OXALATE 10 MG PO TABS
20.0000 mg | ORAL_TABLET | Freq: Every day | ORAL | Status: DC
  Administered 2016-11-29 – 2016-12-01 (×3): 20 mg via ORAL
  Filled 2016-11-29 (×3): qty 2

## 2016-11-29 MED ORDER — INSULIN ASPART 100 UNIT/ML ~~LOC~~ SOLN
4.0000 [IU] | Freq: Three times a day (TID) | SUBCUTANEOUS | Status: DC
  Administered 2016-11-29 – 2016-12-02 (×7): 4 [IU] via SUBCUTANEOUS

## 2016-11-29 MED ORDER — ASPIRIN 81 MG PO CHEW
324.0000 mg | CHEWABLE_TABLET | Freq: Once | ORAL | Status: AC
  Administered 2016-11-29: 324 mg via ORAL
  Filled 2016-11-29: qty 4

## 2016-11-29 MED ORDER — INSULIN ASPART 100 UNIT/ML ~~LOC~~ SOLN: 7.0000 [IU] | Freq: Once | SUBCUTANEOUS | Status: DC

## 2016-11-29 MED ORDER — FUROSEMIDE 10 MG/ML IJ SOLN
40.0000 mg | Freq: Once | INTRAMUSCULAR | Status: AC
  Administered 2016-11-29: 40 mg via INTRAVENOUS
  Filled 2016-11-29: qty 4

## 2016-11-29 MED ORDER — PANTOPRAZOLE SODIUM 40 MG PO TBEC
40.0000 mg | DELAYED_RELEASE_TABLET | Freq: Every day | ORAL | Status: DC
  Administered 2016-11-29 – 2016-12-02 (×4): 40 mg via ORAL
  Filled 2016-11-29 (×4): qty 1

## 2016-11-29 MED ORDER — ACETAMINOPHEN 325 MG PO TABS
650.0000 mg | ORAL_TABLET | ORAL | Status: DC | PRN
  Administered 2016-11-29: 650 mg via ORAL
  Filled 2016-11-29: qty 2

## 2016-11-29 MED ORDER — HYDRALAZINE HCL 20 MG/ML IJ SOLN
10.0000 mg | INTRAMUSCULAR | Status: DC | PRN
  Administered 2016-11-29: 10 mg via INTRAVENOUS
  Filled 2016-11-29 (×2): qty 1

## 2016-11-29 MED ORDER — SODIUM CHLORIDE 0.9% FLUSH
3.0000 mL | Freq: Two times a day (BID) | INTRAVENOUS | Status: DC
  Administered 2016-11-29 – 2016-12-01 (×4): 3 mL via INTRAVENOUS

## 2016-11-29 MED ORDER — SODIUM CHLORIDE 0.9 % IV SOLN: 250.0000 mL | INTRAVENOUS | Status: DC | PRN

## 2016-11-29 MED ORDER — HYDROCODONE-ACETAMINOPHEN 5-325 MG PO TABS
1.0000 | ORAL_TABLET | ORAL | Status: DC | PRN
  Administered 2016-11-30 (×2): 2 via ORAL
  Filled 2016-11-29: qty 1
  Filled 2016-11-29: qty 2
  Filled 2016-11-29: qty 1

## 2016-11-29 MED ORDER — NITROGLYCERIN IN D5W 200-5 MCG/ML-% IV SOLN
0.0000 ug/min | Freq: Once | INTRAVENOUS | Status: AC
  Administered 2016-11-29: 5 ug/min via INTRAVENOUS
  Filled 2016-11-29: qty 250

## 2016-11-29 MED ORDER — NITROGLYCERIN IN D5W 200-5 MCG/ML-% IV SOLN: 0.0000 ug/min | Freq: Once | INTRAVENOUS | Status: DC

## 2016-11-29 MED ORDER — ONDANSETRON HCL 4 MG/2ML IJ SOLN: 4.0000 mg | Freq: Four times a day (QID) | INTRAMUSCULAR | Status: DC | PRN

## 2016-11-29 MED ORDER — INSULIN ASPART 100 UNIT/ML ~~LOC~~ SOLN: 3.0000 [IU] | Freq: Three times a day (TID) | SUBCUTANEOUS | Status: DC

## 2016-11-29 MED ORDER — FUROSEMIDE 10 MG/ML IJ SOLN: 40.0000 mg | Freq: Once | INTRAMUSCULAR | Status: DC

## 2016-11-29 MED ORDER — INSULIN ASPART 100 UNIT/ML ~~LOC~~ SOLN
0.0000 [IU] | Freq: Three times a day (TID) | SUBCUTANEOUS | Status: DC
  Administered 2016-11-29: 11 [IU] via SUBCUTANEOUS
  Administered 2016-11-29: 2 [IU] via SUBCUTANEOUS
  Administered 2016-11-29: 5 [IU] via SUBCUTANEOUS

## 2016-11-29 MED ORDER — GABAPENTIN 100 MG PO CAPS
100.0000 mg | ORAL_CAPSULE | Freq: Every day | ORAL | Status: DC
  Administered 2016-11-29 – 2016-12-01 (×3): 100 mg via ORAL
  Filled 2016-11-29 (×3): qty 1

## 2016-11-29 MED ORDER — FUROSEMIDE 10 MG/ML IJ SOLN
40.0000 mg | Freq: Two times a day (BID) | INTRAMUSCULAR | Status: DC
  Administered 2016-11-29 – 2016-12-01 (×4): 40 mg via INTRAVENOUS
  Filled 2016-11-29 (×4): qty 4

## 2016-11-29 MED ORDER — DICYCLOMINE HCL 20 MG PO TABS
20.0000 mg | ORAL_TABLET | Freq: Three times a day (TID) | ORAL | Status: DC
  Administered 2016-11-29 – 2016-12-02 (×14): 20 mg via ORAL
  Filled 2016-11-29 (×14): qty 1

## 2016-11-29 MED ORDER — INSULIN GLARGINE 100 UNIT/ML ~~LOC~~ SOLN
40.0000 [IU] | Freq: Two times a day (BID) | SUBCUTANEOUS | Status: DC
  Administered 2016-11-29 – 2016-11-30 (×3): 40 [IU] via SUBCUTANEOUS
  Filled 2016-11-29 (×4): qty 0.4

## 2016-11-29 MED ORDER — ALPRAZOLAM 0.25 MG PO TABS
0.2500 mg | ORAL_TABLET | Freq: Two times a day (BID) | ORAL | Status: DC | PRN
  Administered 2016-11-29: 0.25 mg via ORAL
  Filled 2016-11-29: qty 1

## 2016-11-29 MED ORDER — HEPARIN SODIUM (PORCINE) 5000 UNIT/ML IJ SOLN
5000.0000 [IU] | Freq: Three times a day (TID) | INTRAMUSCULAR | Status: DC
  Administered 2016-11-29 – 2016-12-02 (×10): 5000 [IU] via SUBCUTANEOUS
  Filled 2016-11-29 (×10): qty 1

## 2016-11-29 MED ORDER — METOPROLOL TARTRATE 25 MG PO TABS
25.0000 mg | ORAL_TABLET | Freq: Two times a day (BID) | ORAL | Status: DC
  Administered 2016-11-29 – 2016-11-30 (×3): 25 mg via ORAL
  Filled 2016-11-29 (×3): qty 1

## 2016-11-29 MED ORDER — HYDRALAZINE HCL 20 MG/ML IJ SOLN
10.0000 mg | INTRAMUSCULAR | Status: DC | PRN
  Administered 2016-11-29: 10 mg via INTRAVENOUS
  Filled 2016-11-29: qty 1

## 2016-11-29 MED ORDER — ATORVASTATIN CALCIUM 10 MG PO TABS
10.0000 mg | ORAL_TABLET | Freq: Every day | ORAL | Status: DC
  Administered 2016-11-29 – 2016-12-02 (×4): 10 mg via ORAL
  Filled 2016-11-29 (×4): qty 1

## 2016-11-29 MED ORDER — SODIUM CHLORIDE 0.9% FLUSH: 3.0000 mL | INTRAVENOUS | Status: DC | PRN

## 2016-11-29 NOTE — H&P (Signed)
History and Physical    Patricia [name] SEG:[id number] DOB: 1956/05/03 DOA: 11/29/2016  PCP: Maren Reamer, MD   Patient coming from: Home   Chief Complaint: SOB, orthopnea, leg swelling   HPI: Patricia [name] is a 61 y.o. female with medical history significant for insulin-dependent diabetes mellitus, hypertension, chronic kidney disease stage III, and depression with anxiety who presents to the emergency department with 2-3 days of dyspnea, worsening acutely overnight. Patient reports that she noted the insidious development of exertional dyspnea roughly 3 days ago, and describes it is progressively worsening since then. This has been accompanied by bilateral lower extremity edema which began a little over a month ago and was evaluated by her PCP with negative bilateral lower extremity venous Dopplers and a normal BNP. She reports that the leg swelling has slowly worsened. Last night, patient was experiencing orthopnea and PND. She was unable to sleep and had increasing dyspnea while at rest. She denies any chest pain or palpitations and denies any history of heart attack. She denies any significant alcohol use. Patient denies any fevers or chills, abdominal pain, vomiting, or diarrhea.    ED Course: Upon arrival to the ED, patient is found to be afebrile, requiring supplemental oxygen to maintain saturations above 90%, tachypneic to the 30s, and hypertensive to 175/95. EKG features a normal sinus rhythm and chest x-ray is notable for cardiomegaly with diffuse pulmonary edema and small pleural effusions consistent with CHF. Chemistry panel is notable for sodium of 133, BUN of 38, and creatinine of 2.26, up from an apparent baseline of roughly 1.9. Serum glucose is elevated to 362 and both AST and ALT are elevated to 62. Albumin is noted to be low at 2.2. CBC features a normocytic anemia with hemoglobin of 8.5, down from priors in the 9-10 range. Troponin is within the normal limits and BNP is elevated to  a value of 1295. Patient was treated with 324 mg aspirin in the emergency department and given a 40 mg IV push of Lasix. She was started on nitroglycerin infusion and given a nebulized breathing treatment. She reported some slight subjective improvement in her respiratory status with these measures, but continues to have increased work of breathing while at rest and will be admitted to the stepdown unit for ongoing evaluation and management of acute CHF.  Review of Systems:  All other systems reviewed and apart from HPI, are negative.  Past Medical History:  Diagnosis Date  . Diabetes mellitus without complication ([place])   . Hypertension   . Obesity     Past Surgical History:  Procedure Laterality Date  . ABDOMINAL HYSTERECTOMY    . BREAST SURGERY     reduction  . [place]     reports that she has never smoked. She has never used smokeless tobacco. She reports that she does not drink alcohol or use drugs.  Allergies  Allergen Reactions  . Codone [Hydrocodone] Itching  . Norvasc [Amlodipine Besylate] Swelling    Lower extremity?  . Hydralazine Other (See Comments)    Leg swelling  . Sulfa Antibiotics Itching and Rash    Family History  Problem Relation Age of Onset  . Diabetes Brother   . Cancer Brother     stomach     Prior to Admission medications   Medication Sig Start Date End Date Taking? Authorizing Provider  alum & mag hydroxide-simeth (MAALOX/MYLANTA) 200-200-20 MG/5ML suspension Take 15 mLs by mouth every 6 (six) hours as needed  for indigestion or heartburn.    Historical Provider, MD  aspirin EC 81 MG tablet Take 1 tablet (81 mg total) by mouth daily. 09/29/16   Maren Reamer, MD  atorvastatin (LIPITOR) 20 MG tablet Take 0.5 tablets (10 mg total) by mouth daily. 09/29/16   Maren Reamer, MD  Blood Glucose Monitoring Suppl (TRUE METRIX METER) w/Device KIT Use as directed 09/08/16   Maren Reamer, MD  dicyclomine (BENTYL) 20 MG tablet  Take 1 tablet (20 mg total) by mouth 4 (four) times daily -  before meals and at bedtime. 11/01/16   Duffy Bruce, MD  escitalopram (LEXAPRO) 20 MG tablet Take 1 tablet (20 mg total) by mouth at bedtime. 11/09/16   Maren Reamer, MD  gabapentin (NEURONTIN) 100 MG capsule Take 1 capsule (100 mg total) by mouth at bedtime. 09/29/16   Maren Reamer, MD  glipiZIDE (GLUCOTROL) 10 MG tablet TAKE 1 TABLET BY MOUTH 2 TIMES DAILY BEFORE A MEAL. 09/29/16   Maren Reamer, MD  glucose blood (TRUE METRIX BLOOD GLUCOSE TEST) test strip Use as instructed 09/08/16   Maren Reamer, MD  HYDROcodone-acetaminophen (NORCO/VICODIN) 5-325 MG tablet Take 1-2 tablets by mouth every 4 (four) hours as needed for severe pain. 11/01/16   Duffy Bruce, MD  insulin aspart (NOVOLOG) 100 UNIT/ML injection 10 units before meals; Unless eating small meal, Inject 8 units prior.  Do not inject if not eating. 09/29/16   Maren Reamer, MD  Insulin Glargine (LANTUS SOLOSTAR) 100 UNIT/ML Solostar Pen Inject 40 Units into the skin 2 (two) times daily. 11/27/16   Tresa Garter, MD  Insulin Pen Needle 29G X 12.7MM MISC 10 Units by Does not apply route 1 day or 1 dose. 01/28/15   Lance Bosch, NP  Insulin Syringe-Needle U-100 30G X 5/16" 1 ML MISC Test 3 times daily 05/29/15   Tresa Garter, MD  isosorbide mononitrate (IMDUR) 60 MG 24 hr tablet Take 1 tablet (60 mg total) by mouth daily. 11/09/16   Maren Reamer, MD  lisinopril (PRINIVIL,ZESTRIL) 5 MG tablet Take 1 tablet (5 mg total) by mouth daily. 11/23/16   Maren Reamer, MD  metoprolol tartrate (LOPRESSOR) 25 MG tablet Take 1 tablet (25 mg total) by mouth 2 (two) times daily. 10/21/16   Maren Reamer, MD  omeprazole (PRILOSEC) 20 MG capsule Take 1 capsule (20 mg total) by mouth daily. 11/01/16 11/15/16  Duffy Bruce, MD  ondansetron (ZOFRAN) 4 MG tablet Take 1 tablet (4 mg total) by mouth every 8 (eight) hours as needed for nausea or vomiting. 11/01/16    Duffy Bruce, MD  TRUEPLUS LANCETS 28G MISC Use as directed 09/08/16   Maren Reamer, MD    Physical Exam: Vitals:   11/29/16 0319 11/29/16 0445 11/29/16 0510  BP: 174/94 (!) 199/108 192/99  Pulse: 98 99 96  Resp: (!) 30 (!) 29 (!) 30  Temp: 97.9 F (36.6 C)    SpO2: 93% 94% 94%      Constitutional: Respiratory distress with tachypnea, accessory muscle recruitment. Appears uncomfortable.  Eyes: PERTLA, lids and conjunctivae normal ENMT: Mucous membranes are moist. Posterior pharynx clear of any exudate or lesions.   Neck: normal, supple, no masses, no thyromegaly Respiratory: Sitting upright with increased WOB. Crackles throughout b/l lung fields. No cyanosis.  Cardiovascular: S1 & S2 heard, regular rate and rhythm. Bilateral LE edema to thighs. Unable to visualize JVP. Abdomen: No distension, no tenderness, no masses palpated. Bowel  sounds normal.  Musculoskeletal: no clubbing / cyanosis. No joint deformity upper and lower extremities.   Skin: no significant rashes, lesions, ulcers. Warm, dry, well-perfused. Neurologic: CN 2-12 grossly intact. Sensation intact, DTR normal. Strength 5/5 in all 4 limbs.  Psychiatric: Normal judgment and insight. Alert and oriented x 3. Normal mood and affect.     Labs on Admission: I have personally reviewed following labs and imaging studies  CBC:  Recent Labs Lab 11/29/16 0325  WBC 8.3  NEUTROABS 5.4  HGB 8.5*  HCT 26.4*  MCV 80.2  PLT 834   Basic Metabolic Panel:  Recent Labs Lab 11/29/16 0325  NA 133*  K 4.2  CL 106  CO2 17*  GLUCOSE 362*  BUN 38*  CREATININE 2.26*  CALCIUM 8.2*   GFR: Estimated Creatinine Clearance: 28.7 mL/min (by C-G formula based on SCr of 2.26 mg/dL (H)). Liver Function Tests:  Recent Labs Lab 11/29/16 0325  AST 62*  ALT 62*  ALKPHOS 112  BILITOT 0.5  PROT 5.4*  ALBUMIN 2.2*   No results for input(s): LIPASE, AMYLASE in the last 168 hours. No results for input(s): AMMONIA in the  last 168 hours. Coagulation Profile: No results for input(s): INR, PROTIME in the last 168 hours. Cardiac Enzymes: No results for input(s): CKTOTAL, CKMB, CKMBINDEX, TROPONINI in the last 168 hours. BNP (last 3 results) No results for input(s): PROBNP in the last 8760 hours. HbA1C: No results for input(s): HGBA1C in the last 72 hours. CBG: No results for input(s): GLUCAP in the last 168 hours. Lipid Profile: No results for input(s): CHOL, HDL, LDLCALC, TRIG, CHOLHDL, LDLDIRECT in the last 72 hours. Thyroid Function Tests: No results for input(s): TSH, T4TOTAL, FREET4, T3FREE, THYROIDAB in the last 72 hours. Anemia Panel: No results for input(s): VITAMINB12, FOLATE, FERRITIN, TIBC, IRON, RETICCTPCT in the last 72 hours. Urine analysis:    Component Value Date/Time   COLORURINE YELLOW 11/01/2016 2000   APPEARANCEUR CLEAR 11/01/2016 2000   LABSPEC 1.015 11/01/2016 2000   PHURINE 6.0 11/01/2016 2000   GLUCOSEU >=500 (A) 11/01/2016 2000   HGBUR NEGATIVE 11/01/2016 2000   BILIRUBINUR NEGATIVE 11/01/2016 2000   BILIRUBINUR 1.0 10/09/2016 1306   KETONESUR NEGATIVE 11/01/2016 2000   PROTEINUR >=300 (A) 11/01/2016 2000   UROBILINOGEN 1.0 10/09/2016 1306   UROBILINOGEN 0.2 11/04/2014 1530   NITRITE NEGATIVE 11/01/2016 2000   LEUKOCYTESUR NEGATIVE 11/01/2016 2000   Sepsis Labs: @LABRCNTIP (procalcitonin:4,lacticidven:4) )No results found for this or any previous visit (from the past 240 hour(s)).   Radiological Exams on Admission: Dg Chest 2 View  Result Date: 11/29/2016 CLINICAL DATA:  Initial evaluation for acute shortness of breath, orthopnea. EXAM: CHEST  2 VIEW COMPARISON:  Prior radiograph from 11/04/2016. FINDINGS: Mild cardiomegaly, stable. Mediastinal silhouette within normal limits. Lungs hypoinflated. Diffuse pulmonary vascular congestion with interstitial prominence, most compatible with moderate diffuse pulmonary edema. Small left pleural effusion. No definite focal  infiltrates, although they would be difficult to exclude. No pneumothorax. No acute osseous abnormality.  The IMPRESSION: Cardiomegaly with diffuse pulmonary edema and small left pleural effusion, suggesting CHF. Electronically Signed   By: Jeannine Boga M.D.   On: 11/29/2016 04:05    EKG: Independently reviewed. Normal sinus rhythm.   Assessment/Plan  1. Acute CHF, acute hypoxic respiratory failure - Presents with ~1 month of BLE edema, more recent DOE, and now dyspnea at rest with orthopnea and PND - There is diffuse pulmonary edema on CXR and BNP is elevated to 1295 - There is no  documented hx of CHF; she was evaluated recently by PCP for BLE edema with negative venous dopplers and a normal BNP on 11/04/16 - No chest pain, EKG findings, or elevated troponin to suggest an acute ischemic etiology; monitor on telemetry for ischemic change and obtain serial troponin measurement  - She has been given Lasix 40 mg IVP in ED and started on nitroglycerin infusion  - Will admit to SDU for prn BiPAP, continue NTG infusion for now, start diureses with Lasix 40 mg IV q12h - Her lisinopril is held in light of poor renal fxn and planned diuresis; Lopressor held until better compensated  - SLIV, fluid-restrict diet, follow daily wts and strict I/O's  - Obtain echocardiogram   2. CKD stage III-IV - SCr is 2.26 on admission, up from apparent baseline of ~1.9 - Hold lisinopril as above, following daily chem panel with diuresis    3. Insulin-dependent DM  - A1c 10.1% in January 2018  - Managed at home with Lantus 40 units qHS, Novolog 8-10 units with meals, and glipizide  - Check CBG with meals and qHS  - Continue Lantus 40 units qHS, Novolog 4 units with meals, and Novolog correctional per moderate-intensity sliding-scale  - Serum glucose 362 on admission and Novolog 7 units sq given x1    4. Anemia of CKD - Hgb 8.5 on admission, down from priors in 9-10 range, drop likely dilutional from acute  CHF  - No bleeding identified - Check anemia panel    5. Anxiety and depression  - Reportedly much improved last couple mos after starting Lexapro, will continue  - Appears to stable   6. Hypertension  - BP is elevated on admission in setting of acute CHF  - She is being diuresed with IV Lasix q12h, is on NTG infusion, and will be given hydralazine IVP's prn  - She is managed with lisinopril and Lopressor at home, both held on admission as above   7. Elevated transaminases  - There is mild elevation in AST to 62 and ALT to 62 - No abd pain; bili and alk phos wnl; denies EtOH use  - Likely represents congestive hepatopathy; consider further eval if fails to resolve with diuresis   8. Hypoalbuminemia  - Albumin 2.2 on admission, possibly contributing to her edema  - Dietary consultation requested for nutritional recs     DVT prophylaxis: sq heparin  Code Status: Full  Family Communication: Daughter updated at bedside Disposition Plan: Admit to SDU Consults called: None Admission status: Inpatient    Vianne Bulls, MD [facility] Pager [phone]  If 7PM-7AM, please contact night-coverage [email or web address] Password [facility]  11/29/2016, 5:44 AM

## 2016-11-29 NOTE — Plan of Care (Signed)
Problem: Cardiac: Goal: Ability to achieve and maintain adequate cardiopulmonary perfusion will improve Outcome: Progressing Pt's VS are beginning to stabilize and her edema is starting to slowly decrease, lung sounds are clear at this time and patient is starting to rest more comfortably, will monitor VS, I&O and patient's mentation carefully and continue to treat.

## 2016-11-29 NOTE — Plan of Care (Signed)
Problem: Education: Goal: Ability to demonstrate managment of disease process will improve Outcome: Progressing Reviewed Living with Heart failure booklet and also got her some information on foods low in sodium, reviewed this with her daughter, will continue to review during her hospitalization and answer questions as appropriate

## 2016-11-29 NOTE — ED Notes (Signed)
Patient transported to X-ray 

## 2016-11-29 NOTE — ED Triage Notes (Signed)
Pt states that she is short of breath that has gotten worse last night. Worse when she lays down

## 2016-11-29 NOTE — ED Notes (Signed)
Pt's SpO2 88% on room air. Pt placed on 2L O2, SpO2 93%.

## 2016-11-29 NOTE — Progress Notes (Signed)
Pt seen and examined at bedside, admitted after midnight, please see earlier admission note by Dr. Myna Hidalgo. Pt admitted for evaluation of dyspnea, orthopnea, symptoms thought to be due to acute pulmonary vascular congestion. Pt also found to have SBP as high as in 200's requiring placement on Nitro drip. Currently medically stable, ECHO requested, will follow up on results. Pt started on lasix 40 mg IV BID. Monitor daily weights, strict I/O. CBC and BMP in AM.   Faye Ramsay, MD  [facility] Pager [phone]  If 7PM-7AM, please contact night-coverage [email or web address] Password [email or web address]

## 2016-11-29 NOTE — Progress Notes (Signed)
Report received in room via Nona Dell RN using SBAR format, reviewed orders, labs, VS, meds and patient's general condition, assumed care of patient.

## 2016-11-29 NOTE — ED Provider Notes (Signed)
[facility] DEPT Provider Note   CSN: [id number] Arrival date & time: 11/29/16  0310    By signing my name below, I, Macon Large, attest that this documentation has been prepared under the direction and in the presence of Delora Fuel, MD. Electronically Signed: Macon Large, ED Scribe. 11/29/16. 3:27 AM.  History   Chief Complaint Chief Complaint  Patient presents with  . Shortness of Breath   The history is provided by the patient and a relative. No language interpreter was used.   HPI Comments: Patricia [name] is a 61 y.o. female with PMHx of HTN, DM and obesity who presents to the Emergency Department complaining of gradually worsening, constant, SOB onset two days ago. Pt reports associated clear sputum cough. Pt notes her breathing is exacerbated with laying down and coughing. She notes using breathing treatments, albuterol, at home with minimal relief. Pt denies fever and chest pain. No additional complaints at this time.   Past Medical History:  Diagnosis Date  . Diabetes mellitus without complication ([place])   . Hypertension   . Obesity     Patient Active Problem List   Diagnosis Date Noted  . Lipoma 10/21/2016  . CKD (chronic kidney disease) stage 3, GFR 30-59 ml/min 08/10/2016  . Anxiety and depression 12/12/2015  . Essential hypertension 02/12/2015  . Hyperglycemia 02/12/2015  . Diabetic nephropathy associated with type 2 diabetes mellitus ([place]) 11/15/2014  . Flank pain 11/15/2014  . Colon cancer screening 10/01/2014  . Moderate nonproliferative diabetic retinopathy(362.05) 04/17/2014  . 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 Essential hypertension, benign 03/28/2014  . Dental caries 03/28/2014  . Depression 09/14/2013  . Dyslipidemia 04/18/2013  . Diabetes ([place]) 03/10/2013  . HTN (hypertension) 03/10/2013    Past Surgical History:  Procedure Laterality Date  . ABDOMINAL HYSTERECTOMY    . BREAST SURGERY     reduction  . CESAREAN SECTION   1982, 1988    OB History    Gravida Para Term Preterm AB Living   _0 SAB TAB Ectopic Multiple Live Births                   Home Medications    Prior to Admission medications   Medication Sig Start Date End Date Taking? Authorizing Provider  alum & mag hydroxide-simeth (MAALOX/MYLANTA) 200-200-20 MG/5ML suspension Take 15 mLs by mouth every 6 (six) hours as needed for indigestion or heartburn.    Historical Provider, MD  aspirin EC 81 MG tablet Take 1 tablet (81 mg total) by mouth daily. 09/29/16   Maren Reamer, MD  atorvastatin (LIPITOR) 20 MG tablet Take 0.5 tablets (10 mg total) by mouth daily. 09/29/16   Maren Reamer, MD  Blood Glucose Monitoring Suppl (TRUE METRIX METER) w/Device KIT Use as directed 09/08/16   Maren Reamer, MD  dicyclomine (BENTYL) 20 MG tablet Take 1 tablet (20 mg total) by mouth 4 (four) times daily -  before meals and at bedtime. 11/01/16   Duffy Bruce, MD  escitalopram (LEXAPRO) 20 MG tablet Take 1 tablet (20 mg total) by mouth at bedtime. 11/09/16   Maren Reamer, MD  gabapentin (NEURONTIN) 100 MG capsule Take 1 capsule (100 mg total) by mouth at bedtime. 09/29/16   Maren Reamer, MD  glipiZIDE (GLUCOTROL) 10 MG tablet TAKE 1 TABLET BY MOUTH 2 TIMES DAILY BEFORE A MEAL. 09/29/16   Maren Reamer, MD  glucose blood (TRUE METRIX BLOOD GLUCOSE TEST)  test strip Use as instructed 09/08/16   Maren Reamer, MD  HYDROcodone-acetaminophen (NORCO/VICODIN) 5-325 MG tablet Take 1-2 tablets by mouth every 4 (four) hours as needed for severe pain. 11/01/16   Duffy Bruce, MD  insulin aspart (NOVOLOG) 100 UNIT/ML injection 10 units before meals; Unless eating small meal, Inject 8 units prior.  Do not inject if not eating. 09/29/16   Maren Reamer, MD  Insulin Glargine (LANTUS SOLOSTAR) 100 UNIT/ML Solostar Pen Inject 40 Units into the skin 2 (two) times daily. 11/27/16   Tresa Garter, MD  Insulin Pen Needle 29G X 12.7MM MISC 10  Units by Does not apply route 1 day or 1 dose. 01/28/15   Lance Bosch, NP  Insulin Syringe-Needle U-100 30G X 5/16" 1 ML MISC Test 3 times daily 05/29/15   Tresa Garter, MD  isosorbide mononitrate (IMDUR) 60 MG 24 hr tablet Take 1 tablet (60 mg total) by mouth daily. 11/09/16   Maren Reamer, MD  lisinopril (PRINIVIL,ZESTRIL) 5 MG tablet Take 1 tablet (5 mg total) by mouth daily. 11/23/16   Maren Reamer, MD  metoprolol tartrate (LOPRESSOR) 25 MG tablet Take 1 tablet (25 mg total) by mouth 2 (two) times daily. 10/21/16   Maren Reamer, MD  omeprazole (PRILOSEC) 20 MG capsule Take 1 capsule (20 mg total) by mouth daily. 11/01/16 11/15/16  Duffy Bruce, MD  ondansetron (ZOFRAN) 4 MG tablet Take 1 tablet (4 mg total) by mouth every 8 (eight) hours as needed for nausea or vomiting. 11/01/16   Duffy Bruce, MD  TRUEPLUS LANCETS 28G MISC Use as directed 09/08/16   Maren Reamer, MD    Family History Family History  Problem Relation Age of Onset  . Diabetes Brother   . Cancer Brother     stomach    Social History Social History  Substance Use Topics  . Smoking status: Never Smoker  . Smokeless tobacco: Never Used  . Alcohol use No     Allergies   Codone [hydrocodone]; Hydralazine; Norvasc [amlodipine besylate]; and Sulfa antibiotics   Review of Systems Review of Systems  Constitutional: Negative for fever.  Respiratory: Positive for cough and shortness of breath.   Cardiovascular: Negative for chest pain.  All other systems reviewed and are negative.    Physical Exam Updated Vital Signs BP 174/94   Pulse 98   Temp 97.9 F (36.6 C)   Resp (!) 30   SpO2 93%   Physical Exam  Constitutional: She is oriented to person, place, and time. She appears well-developed and well-nourished.  HENT:  Head: Normocephalic and atraumatic.  Eyes: EOM are normal. Pupils are equal, round, and reactive to light.  Neck: Normal range of motion. Neck supple. No JVD present.    Positive HJR.   Cardiovascular: Normal rate, regular rhythm and normal heart sounds.   No murmur heard. Pulmonary/Chest: Effort normal. She has wheezes. She has rales. She exhibits no tenderness.  Bibasilar rales. Worse expiratory wheezes throughout.   Abdominal: Soft. Bowel sounds are normal. She exhibits no distension and no mass. There is no tenderness.  Musculoskeletal: Normal range of motion. She exhibits edema.  2-3+ pretibial edema. 2+ presacaral edema.   Lymphadenopathy:    She has no cervical adenopathy.  Neurological: She is alert and oriented to person, place, and time. No cranial nerve deficit. She exhibits normal muscle tone. Coordination normal.  Skin: Skin is warm and dry. No rash noted.  Psychiatric: She has a normal mood  and affect. Her behavior is normal. Judgment and thought content normal.  Nursing note and vitals reviewed.    ED Treatments / Results   DIAGNOSTIC STUDIES: Oxygen Saturation is 93% on RA, normal by my interpretation.    COORDINATION OF CARE: 3:22 AM Discussed treatment plan with pt at bedside which includes labs, chest imaging and breathing treatment and pt agreed to plan.   Labs (all labs ordered are listed, but only abnormal results are displayed) Labs Reviewed  COMPREHENSIVE METABOLIC PANEL - Abnormal; Notable for the following:       Result Value   Sodium 133 (*)    CO2 17 (*)    Glucose, Bld 362 (*)    BUN 38 (*)    Creatinine, Ser 2.26 (*)    Calcium 8.2 (*)    Total Protein 5.4 (*)    Albumin 2.2 (*)    AST 62 (*)    ALT 62 (*)    GFR calc non Af Amer 23 (*)    GFR calc Af Amer 26 (*)    All other components within normal limits  CBC WITH DIFFERENTIAL/PLATELET - Abnormal; Notable for the following:    RBC 3.29 (*)    Hemoglobin 8.5 (*)    HCT 26.4 (*)    MCH 25.8 (*)    All other components within normal limits  BRAIN NATRIURETIC PEPTIDE - Abnormal; Notable for the following:    B Natriuretic Peptide 1,295.3 (*)    All  other components within normal limits  I-STAT TROPOININ, ED    EKG  EKG Interpretation  Date/Time:  Sunday November 29 2016 03:24:40 EST Ventricular Rate:  91 PR Interval:    QRS Duration: 91 QT Interval:  393 QTC Calculation: 484 R Axis:   72 Text Interpretation:  Sinus rhythm Baseline wander in lead(s) V2 Otherwise within normal limits When compared with ECG of 03/28/2008, No significant change was found Confirmed by [facility]  MD, Quillan Whitter ([id number]) on 11/29/2016 3:27:54 AM       Radiology Dg Chest 2 View  Result Date: 11/29/2016 CLINICAL DATA:  Initial evaluation for acute shortness of breath, orthopnea. EXAM: CHEST  2 VIEW COMPARISON:  Prior radiograph from 11/04/2016. FINDINGS: Mild cardiomegaly, stable. Mediastinal silhouette within normal limits. Lungs hypoinflated. Diffuse pulmonary vascular congestion with interstitial prominence, most compatible with moderate diffuse pulmonary edema. Small left pleural effusion. No definite focal infiltrates, although they would be difficult to exclude. No pneumothorax. No acute osseous abnormality.  The IMPRESSION: Cardiomegaly with diffuse pulmonary edema and small left pleural effusion, suggesting CHF. Electronically Signed   By: Jeannine Boga M.D.   On: 11/29/2016 04:05    Procedures Procedures (including critical care time) CRITICAL CARE Performed by: YFVCB,SWHQP Total critical care time: 55 minutes Critical care time was exclusive of separately billable procedures and treating other patients. Critical care was necessary to treat or prevent imminent or life-threatening deterioration. Critical care was time spent personally by me on the following activities: development of treatment plan with patient and/or surrogate as well as nursing, discussions with consultants, evaluation of patient's response to treatment, examination of patient, obtaining history from patient or surrogate, ordering and performing treatments and interventions,  ordering and review of laboratory studies, ordering and review of radiographic studies, pulse oximetry and re-evaluation of patient's condition.  Medications Ordered in ED Medications  ipratropium-albuterol (DUONEB) 0.5-2.5 (3) MG/3ML nebulizer solution 3 mL (3 mLs Nebulization Given 11/29/16 0403)  aspirin chewable tablet 324 mg (324 mg Oral Given 11/29/16 0403)  furosemide (LASIX) injection 40 mg (40 mg Intravenous Given 11/29/16 0414)  nitroGLYCERIN 50 mg in dextrose 5 % 250 mL (0.2 mg/mL) infusion (5 mcg/min Intravenous New Bag/Given 11/29/16 4097)     Initial Impression / Assessment and Plan / ED Course  I have reviewed the triage vital signs and the nursing notes.  Pertinent labs & imaging results that were available during my care of the patient were reviewed by me and considered in my medical decision making (see chart for details).  Acute dyspnea with findings suggestive of congestive heart failure. She did get some relief with albuterol at home, so she is given a nebulizer treatment albuterol and ipratropium. Screening labs are obtained showing mild worsening of renal failure with creatinine 2.26. She has a metabolic acidosis of uncertain cause, but with normal anion gap. She has chronic anemia which is unchanged from baseline. BNP is markedly elevated. She is given a dose of furosemide. Blood pressure was quite high, and so she was started on nitroglycerin infusion. Old records are reviewed, and she has been followed as an outpatient for peripheral edema but no actual diagnosis of heart failure. Case is discussed with Dr. Myna Hidalgo of [facility] who agrees to admit the patient to stepdown unit.  Final Clinical Impressions(s) / ED Diagnoses   Final diagnoses:  Acute pulmonary edema (HCC)  Renal insufficiency  Metabolic acidosis  Normocytic anemia    New Prescriptions New Prescriptions   No medications on file    I personally performed the services described in this  documentation, which was scribed in my presence. The recorded information has been reviewed and is accurate.       Delora Fuel, MD 35/32/99 [id number]

## 2016-11-29 NOTE — Progress Notes (Signed)
CRITICAL VALUE ALERT  Critical value received: troponin  Date of notification:  02/18  Time of notification: 1543  Critical value read back: yes  Nurse who received alert:  Nona Dell RN   MD notified (1st page):  Dr Doyle Askew  Time of first page:  1544  MD notified (2nd page):  Time of second page:  Responding MD:  Dr Doyle Askew  Time MD responded:  [phone]

## 2016-11-29 NOTE — Progress Notes (Signed)
Patient resting comfortably on 3L [place] with no respiratory distress noted. BIPAP not needed at this time. RT will monitor as needed.

## 2016-11-29 NOTE — Progress Notes (Signed)
Patient transferred from ED with RN. Pt awake, oriented and able to move self over from stretcher to bed. Patient tachypnic, RR 30-40, upper airway wheezing. IV nitro infusing, rate increased to 20 mcg at this time due to elevated BP. Patient states 0/10 pain at this time. Daughters are at bedside. Will continue to monitor.

## 2016-11-30 ENCOUNTER — Inpatient Hospital Stay (HOSPITAL_COMMUNITY): Payer: Self-pay

## 2016-11-30 ENCOUNTER — Encounter (HOSPITAL_COMMUNITY): Payer: Self-pay

## 2016-11-30 DIAGNOSIS — Z794 Long term (current) use of insulin: Secondary | ICD-10-CM

## 2016-11-30 DIAGNOSIS — J9601 Acute respiratory failure with hypoxia: Secondary | ICD-10-CM

## 2016-11-30 DIAGNOSIS — D509 Iron deficiency anemia, unspecified: Secondary | ICD-10-CM

## 2016-11-30 DIAGNOSIS — E119 Type 2 diabetes mellitus without complications: Secondary | ICD-10-CM

## 2016-11-30 DIAGNOSIS — R0600 Dyspnea, unspecified: Secondary | ICD-10-CM

## 2016-11-30 DIAGNOSIS — I10 Essential (primary) hypertension: Secondary | ICD-10-CM

## 2016-11-30 DIAGNOSIS — N183 Chronic kidney disease, stage 3 (moderate): Secondary | ICD-10-CM

## 2016-11-30 DIAGNOSIS — I509 Heart failure, unspecified: Secondary | ICD-10-CM

## 2016-11-30 LAB — GLUCOSE, CAPILLARY
Glucose-Capillary: 136 mg/dL — ABNORMAL HIGH (ref 65–99)
Glucose-Capillary: 62 mg/dL — ABNORMAL LOW (ref 65–99)
Glucose-Capillary: 118 mg/dL — ABNORMAL HIGH (ref 65–99)
Glucose-Capillary: 117 mg/dL — ABNORMAL HIGH (ref 65–99)
Glucose-Capillary: 93 mg/dL (ref 65–99)
Glucose-Capillary: 87 mg/dL (ref 65–99)

## 2016-11-30 LAB — ECHOCARDIOGRAM COMPLETE
Height: 65 in
Weight: 2976 oz

## 2016-11-30 LAB — BASIC METABOLIC PANEL
ANION GAP: 7 (ref 5–15)
BUN: 37 mg/dL — ABNORMAL HIGH (ref 6–20)
CO2: 21 mmol/L — ABNORMAL LOW (ref 22–32)
CREATININE: 2.35 mg/dL — AB (ref 0.44–1.00)
Calcium: 8.4 mg/dL — ABNORMAL LOW (ref 8.9–10.3)
Chloride: 110 mmol/L (ref 101–111)
GFR calc non Af Amer: 22 mL/min — ABNORMAL LOW (ref >60)
GFR, EST AFRICAN AMERICAN: 25 mL/min — AB (ref >60)
GLUCOSE: 59 mg/dL — AB (ref 65–99)
Potassium: 3.2 mmol/L — ABNORMAL LOW (ref 3.5–5.1)
Sodium: 138 mmol/L (ref 135–145)

## 2016-11-30 LAB — CBC
HCT: 25.3 % — ABNORMAL LOW (ref 36.0–46.0)
HEMOGLOBIN: 8.2 g/dL — AB (ref 12.0–15.0)
MCH: 25.9 pg — ABNORMAL LOW (ref 26.0–34.0)
MCHC: 32.4 g/dL (ref 30.0–36.0)
MCV: 79.8 fL (ref 78.0–100.0)
PLATELETS: 272 10*3/uL (ref 150–400)
RBC: 3.17 MIL/uL — ABNORMAL LOW (ref 3.87–5.11)
RDW: 15.3 % (ref 11.5–15.5)
WBC: 10.1 10*3/uL (ref 4.0–10.5)

## 2016-11-30 MED ORDER — INSULIN ASPART 100 UNIT/ML ~~LOC~~ SOLN
0.0000 [IU] | Freq: Three times a day (TID) | SUBCUTANEOUS | Status: DC
  Administered 2016-12-01 (×2): 2 [IU] via SUBCUTANEOUS
  Administered 2016-12-02: 1 [IU] via SUBCUTANEOUS

## 2016-11-30 MED ORDER — INSULIN ASPART 100 UNIT/ML ~~LOC~~ SOLN: 0.0000 [IU] | Freq: Every day | SUBCUTANEOUS | Status: DC

## 2016-11-30 MED ORDER — INSULIN GLARGINE 100 UNIT/ML ~~LOC~~ SOLN
36.0000 [IU] | Freq: Two times a day (BID) | SUBCUTANEOUS | Status: DC
  Administered 2016-12-01 (×2): 36 [IU] via SUBCUTANEOUS
  Filled 2016-11-30 (×4): qty 0.36

## 2016-11-30 MED ORDER — FERROUS SULFATE 325 (65 FE) MG PO TABS
325.0000 mg | ORAL_TABLET | Freq: Two times a day (BID) | ORAL | Status: DC
  Administered 2016-11-30 – 2016-12-02 (×4): 325 mg via ORAL
  Filled 2016-11-30 (×4): qty 1

## 2016-11-30 MED ORDER — POTASSIUM CHLORIDE CRYS ER 20 MEQ PO TBCR
40.0000 meq | EXTENDED_RELEASE_TABLET | Freq: Once | ORAL | Status: AC
  Administered 2016-11-30: 40 meq via ORAL
  Filled 2016-11-30: qty 2

## 2016-11-30 MED ORDER — NITROGLYCERIN IN D5W 200-5 MCG/ML-% IV SOLN
0.0000 ug/min | INTRAVENOUS | Status: DC
  Administered 2016-12-01: 15 ug/min via INTRAVENOUS
  Administered 2016-12-01: 10 ug/min via INTRAVENOUS
  Filled 2016-11-30: qty 250

## 2016-11-30 MED ORDER — GLUCERNA SHAKE PO LIQD
237.0000 mL | Freq: Two times a day (BID) | ORAL | Status: DC
  Administered 2016-12-01 – 2016-12-02 (×3): 237 mL via ORAL

## 2016-11-30 MED ORDER — CARVEDILOL 6.25 MG PO TABS
6.2500 mg | ORAL_TABLET | Freq: Two times a day (BID) | ORAL | Status: DC
  Administered 2016-11-30 – 2016-12-01 (×2): 6.25 mg via ORAL
  Filled 2016-11-30 (×2): qty 1

## 2016-11-30 NOTE — Progress Notes (Signed)
Inpatient Diabetes Program Recommendations  AACE/ADA: New Consensus Statement on Inpatient Glycemic Control (2015)  Target Ranges:  Prepandial:   less than 140 mg/dL      Peak postprandial:   less than 180 mg/dL (1-2 hours)      Critically ill patients:  140 - 180 mg/dL   Results for MYLO, DRISKILL (MRN [id number]) as of 11/30/2016 10:20  Ref. Range 11/29/2016 16:19 11/29/2016 21:42 11/30/2016 07:41 11/30/2016 08:23 11/30/2016 09:56  Glucose-Capillary Latest Ref Range: 65 - 99 mg/dL 133 (H) 95 62 (L) 136 (H) 118 (H)   Review of Glycemic Control  Diabetes history: DM 2 Outpatient Diabetes medications: Lantus 40 units BID, Novolog 10 units TID meal coverage, Glipizide 10 mg BID Current orders for Inpatient glycemic control: Lantus 40 units BID, Novolog Moderate + Novolog 4 units TID meal coverage  Inpatient Diabetes Program Recommendations:   Patient admitted with higher than baseline creatinine. Patient had hypoglycemia 62 mg/dl this am.   Please consider decreasing Lantus to 36-38 units BID, also decrease Novolog to Sensitive Correction.  Thanks,  Tama Headings RN, MSN, [facility] Inpatient Diabetes Coordinator Team Pager [phone] (8a-5p)

## 2016-11-30 NOTE — Progress Notes (Signed)
  Echocardiogram 2D Echocardiogram has been performed.  Diamond Nickel 11/30/2016, 3:31 PM

## 2016-11-30 NOTE — Progress Notes (Signed)
Initial Nutrition Assessment  DOCUMENTATION CODES:   Obesity unspecified  INTERVENTION:  Provide Glucerna Shake po BID, each supplement provides 220 kcal and 10 grams of protein.  Encourage adequate PO intake.   NUTRITION DIAGNOSIS:   Increased nutrient needs related to chronic illness as evidenced by estimated needs.  GOAL:   Patient will meet greater than or equal to 90% of their needs  MONITOR:   PO intake, Supplement acceptance, Labs, Weight trends, Skin, I & O's  REASON FOR ASSESSMENT:   Consult Assessment of nutrition requirement/status  ASSESSMENT:   61 y.o. female with medical history significant for insulin-dependent diabetes mellitus, hypertension, chronic kidney disease stage III, and depression with anxiety who presents to the emergency department with 2-3 days of dyspnea, orthopnea, leg swelling.  Meal completion has been 50-90%. Pt reports appetite is just "ok". Pt reports she usually consumes 2 meals a day with occasional snacks between meals. Weight has been fluctuating per weight records likely related to fluid status. Pt agreeable to nutritional supplements to aid in caloric and protein needs. RD to order. Pt educated on the importance of protein intake. Pt expressed understanding.   Pt with no observed significant fat or muscle mass loss.   Labs and medications reviewed.   Diet Order:  Diet Heart Room service appropriate? Yes; Fluid consistency: Thin; Fluid restriction: 1500 mL Fluid  Skin:  Reviewed, no issues  Last BM:  2/18   Height:   Ht Readings from Last 1 Encounters:  11/29/16 5\' 5"  (1.651 m)    Weight:   Wt Readings from Last 1 Encounters:  11/30/16 186 lb (84.4 kg)    Ideal Body Weight:  56.8 kg  BMI:  Body mass index is 30.95 kg/m.  Estimated Nutritional Needs:   Kcal:  4034-7425  Protein:  85-100 grams  Fluid:  1.5 L/day  EDUCATION NEEDS:   Education needs addressed  Corrin Parker, MS, RD, LDN Pager #  [phone] After hours/ weekend pager # [phone]

## 2016-11-30 NOTE — Progress Notes (Addendum)
Patient ID: Patricia [name], female   DOB: 1956-05-28, 61 y.o.   MRN: [id number]    PROGRESS NOTE    Patricia [name]  FBP:[id number] DOB: 10-06-1956 DOA: 11/29/2016  PCP: Maren Reamer, MD   Brief Narrative:  61 yo female with a PMH significant for IDDM, HTN, CKD III, depression, anxiety, and CHF who presented to the [facility] with 2-3 day duration of progressively worsening dyspnea.   Assessment & Plan:   Principal Problem:   Acute respiratory failure secondary to acute exacerbation of systolic CHF - currently on lasix 40 mg IV BID - further escalation of lasix dose limited due to underlying kidney disease - cardiology consulted and ECHO has been requested, EF 25-30% - continue to follow up on cardiology team recommendations - monitor daily weights, strict I/O  Active Problems:   HTN - ive urgency - initially started on Nitro drip - taper to off  - currently on Coreg and Lasix and SBO holding in 140 - 150's    Acute on CKD (chronic kidney disease) stage 3, GFR 30-59 ml/min - Cr up this AM from lasix - monitor closely - BMP In AM    Diabetes mellitus, type II, 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    Hypokalemia - supplement, repeat BMP in AM    Hyponatremia - now WNL   DVT prophylaxis: Heparin sq Code Status: Full  Family Communication: Patient and family at bedside  Disposition Plan: to be determined but will go home once cardiology team clears   Consultants:   Cardiology   Procedures:   ECHO  Antimicrobials:   None  Subjective: Pt reports feeling better this AM. Still with orthopnea and exertional dyspnea.    Objective: Vitals:   11/30/16 0800 11/30/16 0845 11/30/16 1020 11/30/16 1119  BP: (!) 152/78 (!) 153/75 (!) 147/76 (!) 151/80  Pulse: 90 88 80 79  Resp: (!) 22  (!) 21 19  Temp: 98.3 F (36.8 C)   97.8 F (36.6 C)  TempSrc: Oral   Oral  SpO2: 98%  98% 97%  Weight:      Height:        Intake/Output Summary (Last 24 hours) at 11/30/16 1632 Last data filed at 11/30/16 1214  Gross per 24 hour  Intake            565.5 ml  Output             1200 ml  Net           -634.5 ml   Filed Weights   11/29/16 0642 11/30/16 0545  Weight: 87.1 kg (192 lb) 84.4 kg (186 lb)    Examination:  General exam: Appears calm and comfortable  Respiratory system: Crackles at bases  Cardiovascular system: S1 & S2 heard, RRR. No rubs, gallops or clicks. Bilateral LE pitting edema.  Gastrointestinal system: Abdomen is nondistended, soft and nontender. No organomegaly or masses felt. Normal bowel sounds heard. Central nervous system: Alert and oriented. No focal neurological deficits.  Data Reviewed: I have personally reviewed following labs and imaging studies  CBC:  Recent Labs  Lab 11/29/16 0325 11/30/16 0437  WBC 8.3 10.1  NEUTROABS 5.4  --   HGB 8.5* 8.2*  HCT 26.4* 25.3*  MCV 80.2 79.8  PLT 291 628   Basic Metabolic Panel:  Recent Labs Lab 11/29/16 0325 11/30/16 0437  NA 133* 138  K 4.2 3.2*  CL 106 110  CO2 17* 21*  GLUCOSE 362* 59*  BUN 38* 37*  CREATININE 2.26* 2.35*  CALCIUM 8.2* 8.4*   Liver Function Tests:  Recent Labs Lab 11/29/16 0325  AST 62*  ALT 62*  ALKPHOS 112  BILITOT 0.5  PROT 5.4*  ALBUMIN 2.2*   Cardiac Enzymes:  Recent Labs Lab 11/29/16 0642 11/29/16 1401 11/29/16 1819  TROPONINI <0.03 0.03* 0.03*   CBG:  Recent Labs Lab 11/29/16 2142 11/30/16 0741 11/30/16 0823 11/30/16 0956 11/30/16 1207  GLUCAP 95 62* 136* 118* 117*   Anemia Panel:  Recent Labs  11/29/16 0642  VITAMINB12 373  FOLATE 19.7  FERRITIN 21  TIBC 386   IRON 22*  RETICCTPCT 1.4   Urine analysis:    Component Value Date/Time   COLORURINE YELLOW 11/01/2016 2000   APPEARANCEUR CLEAR 11/01/2016 2000   LABSPEC 1.015 11/01/2016 2000   PHURINE 6.0 11/01/2016 2000   GLUCOSEU >=500 (A) 11/01/2016 2000   HGBUR NEGATIVE 11/01/2016 2000   BILIRUBINUR NEGATIVE 11/01/2016 2000   BILIRUBINUR 1.0 10/09/2016 1306   KETONESUR NEGATIVE 11/01/2016 2000   PROTEINUR >=300 (A) 11/01/2016 2000   UROBILINOGEN 1.0 10/09/2016 1306   UROBILINOGEN 0.2 11/04/2014 1530   NITRITE NEGATIVE 11/01/2016 2000   LEUKOCYTESUR NEGATIVE 11/01/2016 2000   Recent Results (from the past 240 hour(s))  MRSA PCR Screening     Status: None   Collection Time: 11/29/16  6:50 AM  Result Value Ref Range Status   MRSA by PCR NEGATIVE NEGATIVE Final    Comment:        The GeneXpert MRSA Assay (FDA approved for NASAL specimens only), is one component of a comprehensive MRSA colonization surveillance program. It is not intended to diagnose MRSA infection nor to guide or monitor treatment for MRSA infections.       Radiology Studies: Dg Chest 2 View  Result Date: 11/29/2016 CLINICAL DATA:  Initial evaluation for acute shortness of breath, orthopnea. EXAM: CHEST  2 VIEW COMPARISON:  Prior radiograph from 11/04/2016. FINDINGS: Mild cardiomegaly, stable. Mediastinal silhouette within normal limits. Lungs hypoinflated. Diffuse pulmonary vascular congestion with interstitial prominence, most compatible with moderate diffuse pulmonary edema. Small left pleural effusion. No definite focal infiltrates, although they would be difficult to exclude. No pneumothorax. No acute osseous abnormality.  The IMPRESSION: Cardiomegaly with diffuse pulmonary edema and small left pleural effusion, suggesting CHF. Electronically Signed   By: Jeannine Boga M.D.   On: 11/29/2016 04:05   Scheduled Meds: . aspirin EC  81 mg Oral Daily  . atorvastatin  10 mg Oral Daily  . carvedilol  6.25 mg  Oral BID WC  . dicyclomine  20 mg Oral TID AC & HS  . escitalopram  20 mg Oral QHS  . feeding supplement (GLUCERNA SHAKE)  237 mL Oral BID BM  . furosemide  40 mg Intravenous BID  . gabapentin  100 mg Oral QHS  . heparin  5,000 Units Subcutaneous Q8H  . insulin aspart  0-15 Units Subcutaneous TID WC  . insulin aspart  0-5 Units Subcutaneous QHS  . insulin aspart  4 Units Subcutaneous TID WC  . insulin aspart  7 Units Subcutaneous Once  .  insulin glargine  40 Units Subcutaneous BID  . pantoprazole  40 mg Oral Daily  . sodium chloride flush  3 mL Intravenous Q12H   Continuous Infusions: . nitroGLYCERIN 15 mcg/min (11/30/16 0500)     LOS: 1 day   Time spent: 20 minutes   Faye Ramsay, MD [facility] Pager [phone]  If 7PM-7AM, please contact night-coverage [email or web address] Password [facility] 11/30/2016, 4:32 PM

## 2016-11-30 NOTE — Consult Note (Signed)
Cardiology Consult    Patient ID: Patricia [name] MRN: [id number], DOB/AGE: 59-Sep-1957   Admit date: 11/29/2016 Date of Consult: 11/30/2016  Primary Physician: Maren Reamer, MD Primary Cardiologist: New - Dr. Sallyanne Kuster Requesting Provider: Dr. Doyle Askew  Reason for Consult: CHF exacerbation  Patient Profile    [name] is a 61 yo female with a PMH significant for IDDM, HTN, CKD III, depression, anxiety, and CHF. She presented to the [facility] with 2-3 day history of worsening dyspnea at rest. Cardiology was consulted for management of a possible acute CHF exacerbation.   Past Medical History   Past Medical History:  Diagnosis Date  . Diabetes mellitus without complication ([place])   . Hypertension   . Obesity     Past Surgical History:  Procedure Laterality Date  . ABDOMINAL HYSTERECTOMY    . BREAST SURGERY     reduction  . CESAREAN SECTION  1982, 1988     Allergies  Allergies  Allergen Reactions  . Codone [Hydrocodone] Itching  . Norvasc [Amlodipine Besylate] Swelling    Lower extremity?  . Hydralazine Other (See Comments)    Leg swelling  . Sulfa Antibiotics Itching and Rash    History of Present Illness    Ms [name] was recently seen by her PCP for LE swelling (10/21/16). US dopplers at that time (11/10/16) were negative for DVT and BNP was not elevated. She reported to the ED on 11/29/16 with worsening SOB at rest that started 3 days ago and has progressively worsened. This dyspnea is accompanied by B LE edema. She states that she had increasing dyspnea at rest and was unable to sleep.  She denies chest pain, palpitations, dizziness, nausea, and vomiting. BNP on admission was 1295.  Troponin 0.03 --> 0.03. CXR with cardiomegaly. She was given 324 mg ASA, 40 mg IV lasix, nitro gtt, and nebulizer treatments. Cardiology was consulted for management of this new onset of CHF. On my interview, [name] was maintainong O2 sats on room air, sitting comfortably in bed. She states her dyspnea  is much improved and she is able to rest better at night. She denies chest pain, palpitations, dizziness, lightheadedness, syncope, and nausea/vomiting.   Inpatient Medications    . aspirin EC  81 mg Oral Daily  . atorvastatin  10 mg Oral Daily  . dicyclomine  20 mg Oral TID AC & HS  . escitalopram  20 mg Oral QHS  . furosemide  40 mg Intravenous BID  . gabapentin  100 mg Oral QHS  . heparin  5,000 Units Subcutaneous Q8H  . insulin aspart  0-15 Units Subcutaneous TID WC  . insulin aspart  0-5 Units Subcutaneous QHS  . insulin aspart  4 Units Subcutaneous TID WC  . insulin aspart  7 Units Subcutaneous Once  . insulin glargine  40 Units Subcutaneous BID  . metoprolol tartrate  25 mg Oral BID  . pantoprazole  40 mg Oral Daily  . sodium chloride flush  3 mL Intravenous Q12H     Outpatient Medications    Prior to Admission medications   Medication Sig Start Date End Date Taking? Authorizing Provider  alum & mag hydroxide-simeth (MAALOX/MYLANTA) 200-200-20 MG/5ML suspension Take 15 mLs by mouth every 6 (six) hours as needed for indigestion or heartburn.   Yes Historical Provider, MD  aspirin EC 81 MG tablet Take 1 tablet (81 mg total) by mouth daily. 09/29/16  Yes Maren Reamer, MD  atorvastatin (LIPITOR) 20 MG tablet Take 0.5 tablets (  10 mg total) by mouth daily. 09/29/16  Yes Maren Reamer, MD  escitalopram (LEXAPRO) 20 MG tablet Take 1 tablet (20 mg total) by mouth at bedtime. 11/09/16  Yes Maren Reamer, MD  gabapentin (NEURONTIN) 100 MG capsule Take 1 capsule (100 mg total) by mouth at bedtime. 09/29/16  Yes Dawn T Janne Napoleon, MD  glipiZIDE (GLUCOTROL) 10 MG tablet TAKE 1 TABLET BY MOUTH 2 TIMES DAILY BEFORE A MEAL. 09/29/16  Yes Maren Reamer, MD  glucose blood (TRUE METRIX BLOOD GLUCOSE TEST) test strip Use as instructed 09/08/16  Yes Dawn T Janne Napoleon, MD  insulin aspart (NOVOLOG) 100 UNIT/ML injection 10 units before meals; Unless eating small meal, Inject 8 units prior.   Do not inject if not eating. 09/29/16  Yes Maren Reamer, MD  Insulin Glargine (LANTUS SOLOSTAR) 100 UNIT/ML Solostar Pen Inject 40 Units into the skin 2 (two) times daily. 11/27/16  Yes Tresa Garter, MD  Insulin Pen Needle 29G X 12.7MM MISC 10 Units by Does not apply route 1 day or 1 dose. 01/28/15  Yes Lance Bosch, NP  Insulin Syringe-Needle U-100 30G X 5/16" 1 ML MISC Test 3 times daily 05/29/15  Yes Tresa Garter, MD  isosorbide mononitrate (IMDUR) 60 MG 24 hr tablet Take 1 tablet (60 mg total) by mouth daily. 11/09/16  Yes Maren Reamer, MD  lisinopril (PRINIVIL,ZESTRIL) 5 MG tablet Take 1 tablet (5 mg total) by mouth daily. Patient taking differently: Take 2.5 mg by mouth 2 (two) times daily.  11/23/16  Yes Maren Reamer, MD  metoprolol tartrate (LOPRESSOR) 25 MG tablet Take 1 tablet (25 mg total) by mouth 2 (two) times daily. 10/21/16  Yes Maren Reamer, MD  ondansetron (ZOFRAN) 4 MG tablet Take 1 tablet (4 mg total) by mouth every 8 (eight) hours as needed for nausea or vomiting. 11/01/16  Yes Duffy Bruce, MD  TRUEPLUS LANCETS 28G MISC Use as directed 09/08/16  Yes Maren Reamer, MD  dicyclomine (BENTYL) 20 MG tablet Take 1 tablet (20 mg total) by mouth 4 (four) times daily -  before meals and at bedtime. Patient not taking: Reported on 11/29/2016 11/01/16   Duffy Bruce, MD  HYDROcodone-acetaminophen (NORCO/VICODIN) 5-325 MG tablet Take 1-2 tablets by mouth every 4 (four) hours as needed for severe pain. Patient not taking: Reported on 11/29/2016 11/01/16   Duffy Bruce, MD     Family History     Family History  Problem Relation Age of Onset  . Diabetes Brother   . Cancer Brother     stomach    Social History    Social History   Social History  . Marital status: Divorced    Spouse name: N/A  . Number of children: N/A  . Years of education: N/A   Occupational History  . Not on file.   Social History Main Topics  . Smoking status: Never  Smoker  . Smokeless tobacco: Never Used  . Alcohol use No  . Drug use: No  . Sexual activity: Not Currently    Birth control/ protection: Surgical   Other Topics Concern  . Not on file   Social History Narrative  . No narrative on file     Review of Systems    General:  No chills, fever, night sweats or weight changes.  Cardiovascular:  No chest pain, Positive for dyspnea on exertion, B LE edema, paroxysmal nocturnal dyspnea.. Negative for palpitations Dermatological: No rash, lesions/masses Respiratory: No cough, dyspnea  Urologic: No hematuria, dysuria Abdominal:   No nausea, vomiting, diarrhea, bright red blood per rectum, melena, or hematemesis Neurologic:  No visual changes, changes in mental status. All other systems reviewed and are otherwise negative except as noted above.  Physical Exam    Blood pressure (!) 147/76, pulse 80, temperature 98.3 F (36.8 C), temperature source Oral, resp. rate (!) 21, height 5\' 5"  (1.651 m), weight 186 lb (84.4 kg), SpO2 98 %.  General: Pleasant, NAD Psych: Normal affect. Neuro: Alert and oriented X 3. Moves all extremities spontaneously. HEENT: Normal  Neck: Supple without bruits or JVD appreciated. Lungs:  Resp regular and unlabored, CTA, slightly diminished in bases Heart: RRR no s3, s4, or murmurs. Abdomen: Soft, non-tender, non-distended, BS + x 4.  Extremities: No clubbing, cyanosis; 1+ B LE pitting edema. DP/PT/Radials 2+ and equal bilaterally.  Labs    Troponin [facility] Test)  Recent Labs  11/29/16 0346  TROPIPOC 0.02    Recent Labs  11/29/16 0642 11/29/16 1401 11/29/16 1819  TROPONINI <0.03 0.03* 0.03*   Lab Results  Component Value Date   WBC 10.1 11/30/2016   HGB 8.2 (L) 11/30/2016   HCT 25.3 (L) 11/30/2016   MCV 79.8 11/30/2016   PLT 272 11/30/2016    Recent Labs Lab 11/29/16 0325 11/30/16 0437  NA 133* 138  K 4.2 3.2*  CL 106 110  CO2 17* 21*  BUN 38* 37*  CREATININE 2.26* 2.35*    CALCIUM 8.2* 8.4*  PROT 5.4*  --   BILITOT 0.5  --   ALKPHOS 112  --   ALT 62*  --   AST 62*  --   GLUCOSE 362* 59*   Lab Results  Component Value Date   CHOL 171 08/10/2016   HDL 49 08/10/2016   LDLCALC 82 08/10/2016   TRIG 198 (H) 08/10/2016   Lab Results  Component Value Date   DDIMER 1.95 (H) 11/04/2016     Radiology Studies    Dg Chest 2 View  Result Date: 11/29/2016 CLINICAL DATA:  Initial evaluation for acute shortness of breath, orthopnea. EXAM: CHEST  2 VIEW COMPARISON:  Prior radiograph from 11/04/2016. FINDINGS: Mild cardiomegaly, stable. Mediastinal silhouette within normal limits. Lungs hypoinflated. Diffuse pulmonary vascular congestion with interstitial prominence, most compatible with moderate diffuse pulmonary edema. Small left pleural effusion. No definite focal infiltrates, although they would be difficult to exclude. No pneumothorax. No acute osseous abnormality.  The IMPRESSION: Cardiomegaly with diffuse pulmonary edema and small left pleural effusion, suggesting CHF. Electronically Signed   By: Jeannine Boga M.D.   On: 11/29/2016 04:05   Dg Chest 2 View  Result Date: 11/04/2016 CLINICAL DATA:  Bilateral leg edema. EXAM: CHEST  2 VIEW COMPARISON:  CT 03/28/2008 . FINDINGS: Mediastinum and hilar structures normal. Mild left base subsegmental atelectasis. Mild infiltrate cannot be excluded. Small left base subsegmental atelectasis. Small left pleural effusion. No pneumothorax . IMPRESSION: Mild left base subsegmental atelectasis. Mild infiltrate cannot be excluded. Small left pleural effusion . Electronically Signed   By: Marcello Moores  Register   On: 11/04/2016 12:53   Ct Abdomen Pelvis W Contrast  Result Date: 11/01/2016 CLINICAL DATA:  Left side abdominal pain starting Thursday, nausea, diarrhea EXAM: CT ABDOMEN AND PELVIS WITH CONTRAST TECHNIQUE: Multidetector CT imaging of the abdomen and pelvis was performed using the standard protocol following bolus  administration of intravenous contrast. CONTRAST:  26mL ISOVUE-300 IOPAMIDOL (ISOVUE-300) INJECTION 61% COMPARISON:  11/04/2014 FINDINGS: Lower chest: Lung bases shows small left  pleural effusion. Left base anterior laterally atelectasis or scarring. Small atelectasis noted left base posteriorly. Hepatobiliary: No focal hepatic mass. No calcified gallstones are noted within gallbladder. Pancreas: Minimal prominent pancreatic head region. Body and tail of the pancreas shows normal enhancement. Small fluid noted just posterior to pancreatic tail anterior to left kidney axial image 24. No focal pancreatic mass. Subtle mild stranding of the fat between pancreatic head and duodenum. There is mild stranding of the fat surrounding duodenum. Trace edema duodenal wall. Findings highly suspicious for segmental enteritis, pancreatic head pancreatitis cannot be excluded. Clinical correlation is necessary. There is no evidence of small bowel obstruction. Trace stranding noted in retroperitoneum just inferior to distal duodenum. Spleen: Enhanced spleen with normal appearance. Adrenals/Urinary Tract: No adrenal gland mass. Nonspecific mild perinephric stranding/ fluid. No hydronephrosis or hydroureter. Delayed renal images shows bilateral renal symmetrical excretion. Bilateral visualized proximal ureter is unremarkable. The urinary bladder is under distended grossly unremarkable. Stomach/Bowel: No definite evidence of small bowel obstruction. The terminal ileum is unremarkable. Normal appendix is noted in axial image 70. No pericecal inflammation. Some stool noted within cecum. Trace free fluid noted bilateral paracolic gutter. Descending colon diverticula are noted. Multiple sigmoid colon diverticula. No evidence of acute colitis or diverticulitis. There is mild redundant sigmoid colon. Vascular/Lymphatic: No aortic aneurysm. Mild atherosclerotic calcifications right common iliac artery. No adenopathy. Reproductive: The patient  is status post hysterectomy. Other: Sagittal images of the spine shows a infraumbilical hernia containing omental fat measures 3.6 cm without evidence of acute complication. Trace free fluid is noted within right posterior pelvis. Musculoskeletal: No destructive bony lesions are noted. Mild degenerative changes lumbar spine. Mild posterior disc bulge and minimal anterolisthesis at L4-L5 level. Facet degenerative changes noted L4 and L5 level. IMPRESSION: 1. There is subtle mild prominence of pancreatic head region. There is mild stranding of the fat between pancreatic head and duodenum. Mild edema in duodenal wall. Findings highly suspicious for enteritis/duodenitis, ulcer disease or pancreatic head pancreatitis cannot be excluded. Clinical correlation is necessary. Please note noted there is small fluid just posterior to pancreatic tail anterior to left kidney axial image 24. Mild pancreatitis again cannot be excluded. 2. No evidence of small bowel obstruction. 3. Normal appendix.  No pericecal inflammation. 4. Trace stranding and fluid in noted within retroperitoneum inferior to duodenum and bilateral paracolic gutters. Nonspecific mild perinephric stranding. 5. No hydronephrosis or hydroureter. 6. Status post hysterectomy. 7. Colonic diverticula are noted descending colon and sigmoid colon. No evidence of acute colitis or diverticulitis. 8. Degenerative changes lumbar spine most significant at L4-L5 level. Electronically Signed   By: Lahoma Crocker M.D.   On: 11/01/2016 21:36    ECG & Cardiac Imaging    EKG 11/29/16: NSR  Echocardiogram 11/29/16: Final read pending   Assessment & Plan    1. Dyspnea at rest and LE edema / CHF evaluation - echocardiogram final read pending - BNP on admission was 1295.  Troponin 0.03 --> 0.03. CXR with cardiomegaly - pt is diuresing well with lasix: net negative 1.1L out; weight 186 lbs (192 lbs at admission) - she denies anginal symptoms, but c/o dyspnea at rest;  symptoms have slightly improved with continued diuresis - sCr 2.53 (2.26) with adequate urine output - continue strict I/Os, continue daily weights - continue ASA, lopressor - pt states the lopressor increases fatigue; depending on echo results, consider switching to coreg - If echocardiogram shows significant dysfunction, may consider OP ischemia evaluation. However, new onset CHF likely due to  long-standing HTN. - repeat lipid panel pending - continue diuresis as sCr allows; 40 mg IV BID   2. Acute on chronic kidney disease stage III / anemia - sCr 2.53 (2.26); baseline appears to be 1.86-2.05 - continue to diurese as sCr allows - consider nephrology consult if sCr doesn't improve to baseline - hold home lisinopril while diuresing with lasix - Hb on admission: 8.2 (8.5); baseline 9-10; likely dilutional given hypervolemia; continue daily labs to monitor   3. HTN - home medications include: lisinopril, imdur, lopressor - lopressor was continued per primary team; hold ACEI in the presence of acute on chronic kidney disease - on nitro gtt - 147/76 - 153/75; on admission: 197/102 - recommend restarting imdur for better pressure control and transitioning off of nitro gtt (60 mg imdur as home medication); if more pressure control needed, would recommend norvasc   4. IDDM - A1C 10/2016: 10.1% - per primary team    Signed, Minette Brine, PA-C 11/30/2016, 11:12 AM   I have seen and examined the patient along with Minette Brine, PA-C.  I have reviewed the chart, notes and new data.  I agree with PA's note.  Key new complaints: breathing improved, but still orthopneic. Marked edema of lower extremities persist Key examination changes: loud S4, soft leg edema Key new findings / data: slight worsening of creatinine, hypokalemia.  PLAN: Needs more diuresis, but have to keep this at a moderate pace to avoid worsening renal failure. Echo pending. Switch metoprolol to carvedilol for  better BP lowering effect. Wean off IV NTG to oral nitrates. Note iron deficiency anemia which may make it harder to maintain HF compensation. Will need ischemia evaluation as an outpatient. This is non urgent in the absence of angina (or unless echo shows regional hypokinesis). Unfortunately, both her renal insufficiency and iron deficiency anemia make her a less than desirable candidate for invasive angiography or percutaneous revascularization.  Sanda Klein, MD, [place] [phone] 11/30/2016, 12:51 PM

## 2016-12-01 DIAGNOSIS — I5041 Acute combined systolic (congestive) and diastolic (congestive) heart failure: Secondary | ICD-10-CM

## 2016-12-01 DIAGNOSIS — F418 Other specified anxiety disorders: Secondary | ICD-10-CM

## 2016-12-01 LAB — BASIC METABOLIC PANEL
Anion gap: 9 (ref 5–15)
BUN: 44 mg/dL — AB (ref 6–20)
CALCIUM: 8.1 mg/dL — AB (ref 8.9–10.3)
CO2: 21 mmol/L — ABNORMAL LOW (ref 22–32)
Chloride: 108 mmol/L (ref 101–111)
Creatinine, Ser: 2.59 mg/dL — ABNORMAL HIGH (ref 0.44–1.00)
GFR calc Af Amer: 22 mL/min — ABNORMAL LOW (ref >60)
GFR, EST NON AFRICAN AMERICAN: 19 mL/min — AB (ref >60)
GLUCOSE: 52 mg/dL — AB (ref 65–99)
Potassium: 3.8 mmol/L (ref 3.5–5.1)
Sodium: 138 mmol/L (ref 135–145)

## 2016-12-01 LAB — CBC
HCT: 25.1 % — ABNORMAL LOW (ref 36.0–46.0)
Hemoglobin: 8 g/dL — ABNORMAL LOW (ref 12.0–15.0)
MCH: 25.8 pg — ABNORMAL LOW (ref 26.0–34.0)
MCHC: 31.9 g/dL (ref 30.0–36.0)
MCV: 81 fL (ref 78.0–100.0)
Platelets: 259 10*3/uL (ref 150–400)
RBC: 3.1 MIL/uL — ABNORMAL LOW (ref 3.87–5.11)
RDW: 15.3 % (ref 11.5–15.5)
WBC: 8 10*3/uL (ref 4.0–10.5)

## 2016-12-01 LAB — LIPID PANEL
CHOLESTEROL: 144 mg/dL (ref 0–200)
HDL: 36 mg/dL — ABNORMAL LOW (ref >40)
LDL Cholesterol: 82 mg/dL (ref 0–99)
TRIGLYCERIDES: 129 mg/dL (ref <150)
Total CHOL/HDL Ratio: 4 RATIO
VLDL: 26 mg/dL (ref 0–40)

## 2016-12-01 LAB — GLUCOSE, CAPILLARY
GLUCOSE-CAPILLARY: 172 mg/dL — AB (ref 65–99)
GLUCOSE-CAPILLARY: 182 mg/dL — AB (ref 65–99)
Glucose-Capillary: 59 mg/dL — ABNORMAL LOW (ref 65–99)
Glucose-Capillary: 164 mg/dL — ABNORMAL HIGH (ref 65–99)
Glucose-Capillary: 181 mg/dL — ABNORMAL HIGH (ref 65–99)

## 2016-12-01 MED ORDER — HYDRALAZINE HCL 25 MG PO TABS
25.0000 mg | ORAL_TABLET | Freq: Three times a day (TID) | ORAL | Status: DC
  Administered 2016-12-01 – 2016-12-02 (×4): 25 mg via ORAL
  Filled 2016-12-01 (×4): qty 1

## 2016-12-01 MED ORDER — CARVEDILOL 6.25 MG PO TABS: 6.2500 mg | ORAL_TABLET | Freq: Once | ORAL | Status: AC

## 2016-12-01 MED ORDER — ISOSORBIDE MONONITRATE ER 30 MG PO TB24
30.0000 mg | ORAL_TABLET | Freq: Two times a day (BID) | ORAL | Status: DC
  Administered 2016-12-01 – 2016-12-02 (×3): 30 mg via ORAL
  Filled 2016-12-01 (×3): qty 1

## 2016-12-01 MED ORDER — CARVEDILOL 12.5 MG PO TABS
12.5000 mg | ORAL_TABLET | Freq: Two times a day (BID) | ORAL | Status: DC
  Administered 2016-12-01 – 2016-12-02 (×2): 12.5 mg via ORAL
  Filled 2016-12-01 (×2): qty 1

## 2016-12-01 MED ORDER — FUROSEMIDE 40 MG PO TABS
40.0000 mg | ORAL_TABLET | Freq: Two times a day (BID) | ORAL | Status: DC
  Administered 2016-12-01 – 2016-12-02 (×2): 40 mg via ORAL
  Filled 2016-12-01 (×2): qty 1

## 2016-12-01 MED ORDER — ISOSORBIDE MONONITRATE ER 60 MG PO TB24: 60.0000 mg | ORAL_TABLET | Freq: Every day | ORAL | Status: DC

## 2016-12-01 NOTE — Progress Notes (Signed)
Patient ID: Patricia [name], female   DOB: 11-Jan-1956, 61 y.o.   MRN: [id number]    PROGRESS NOTE  Patricia [name]  UXL:[id number] DOB: 06/25/1956 DOA: 11/29/2016  PCP: Maren Reamer, MD   Brief Narrative:  61 yo female with a PMH significant for IDDM, HTN, CKD III, depression, anxiety, and CHF who presented to the [facility] with 2-3 day duration of progressively worsening dyspnea.   Assessment & Plan:   Principal Problem:   Acute respiratory failure secondary to acute exacerbation of systolic CHF - currently on lasix 40 mg IV BID and since pt improving clinically, Cr up a bit in the past 24 hours, Lasix now changed to PO  - cardiology consulted and ECHO has been requested, EF 25-30% - continue to follow up on cardiology team recommendations - monitor daily weights, strict I/O - weight trend since admission: Filed Weights   11/29/16 0642 11/30/16 0545 12/01/16 0500  Weight: 87.1 kg (192 lb) 84.4 kg (186 lb) 85.7 kg (189 lb)  - I have discussed sodium restriction, daily weights, diuretic dose adjustment - per cardiology, coronary angio is relatively contraindicated due to renal insufficiency, should not be performed now - cardiology team suggests we reevaluate pt's EF in 3 months. If no improvement pt needs to undergo cardiac cath (and if no revascularization is indicated, should also receive AICD for primary prevention). If EF shows substantial improvement, can forego cardiac cath and ICD.  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    Hypokalemia - supplemented and WNL this AM     Hyponatremia - now WNL   DVT prophylaxis: Heparin sq Code Status: Full  Family Communication: Patient and family at bedside  Disposition Plan: likely in am if Cr stable and trending down and if cardiology team ok with discharge   Consultants:   Cardiology   Procedures:   ECHO  Antimicrobials:   None  Subjective: Pt reports feeling better this AM. Less dyspnea.   Objective: Vitals:   11/30/16 2043 11/30/16 2155 12/01/16 0008 12/01/16 0500  BP: (!) 176/89 (!) 159/86 (!) 141/91 (!) 146/87  Pulse: 88 99 85 85  Resp: (!) 21 (!) 30 17 16   Temp: 98.6 F (37 C)  98 F (36.7 C) 97.8 F (36.6 C)  TempSrc: Oral  Oral Oral  SpO2: 99% 99% 94% 98%  Weight:    85.7 kg (189 lb)  Height:        Intake/Output Summary (Last 24 hours) at 12/01/16 1200 Last data filed at 12/01/16 0900  Gross per 24 hour  Intake            658.3 ml  Output  1000 ml  Net           -341.7 ml   Filed Weights   11/29/16 0642 11/30/16 0545 12/01/16 0500  Weight: 87.1 kg (192 lb) 84.4 kg (186 lb) 85.7 kg (189 lb)    Examination:  General exam: Appears calm and comfortable  Respiratory system: Stable respiratory effort with diminished breath sounds at bases  Cardiovascular system: S1 & S2 heard, RRR. Bilateral LE pitting edema.  Gastrointestinal system: Abdomen is nondistended, soft and nontender. No organomegaly or masses felt. Normal bowel sounds heard. Central nervous system: Alert and oriented. No focal neurological deficits.  Data Reviewed: I have personally reviewed following labs and imaging studies  CBC:  Recent Labs Lab 11/29/16 0325 11/30/16 0437 12/01/16 0616  WBC 8.3 10.1 8.0    NEUTROABS 5.4  --   --   HGB 8.5* 8.2* 8.0*  HCT 26.4* 25.3* 25.1*  MCV 80.2 79.8 81.0  PLT 291 272 102   Basic Metabolic Panel:  Recent Labs Lab 11/29/16 0325 11/30/16 0437 12/01/16 0616  NA 133* 138 138  K 4.2 3.2* 3.8  CL 106 110 108  CO2 17* 21* 21*  GLUCOSE 362* 59* 52*  BUN 38* 37* 44*  CREATININE 2.26* 2.35* 2.59*  CALCIUM 8.2* 8.4* 8.1*   Liver Function Tests:  Recent Labs Lab 11/29/16 0325  AST 62*  ALT 62*  ALKPHOS 112  BILITOT 0.5  PROT 5.4*  ALBUMIN 2.2*   Cardiac Enzymes:  Recent Labs Lab 11/29/16 0642 11/29/16 1401 11/29/16 1819  TROPONINI <0.03 0.03* 0.03*   CBG:  Recent Labs Lab 11/30/16 1715 11/30/16 2125 12/01/16 0737 12/01/16 1046 12/01/16 1125  GLUCAP 93 87 59* 172* 164*   Anemia Panel:  Recent Labs  11/29/16 0642  VITAMINB12 373  FOLATE 19.7  FERRITIN 21  TIBC 386  IRON 22*  RETICCTPCT 1.4   Urine analysis:    Component Value Date/Time   COLORURINE YELLOW 11/01/2016 2000   APPEARANCEUR CLEAR 11/01/2016 2000   LABSPEC 1.015 11/01/2016 2000   PHURINE 6.0 11/01/2016 2000   GLUCOSEU >=500 (A) 11/01/2016 2000   HGBUR NEGATIVE 11/01/2016 2000   BILIRUBINUR NEGATIVE 11/01/2016 2000   BILIRUBINUR 1.0 10/09/2016 1306   KETONESUR NEGATIVE 11/01/2016 2000   PROTEINUR >=300 (A) 11/01/2016 2000   UROBILINOGEN 1.0 10/09/2016 1306   UROBILINOGEN 0.2 11/04/2014 1530   NITRITE NEGATIVE 11/01/2016 2000   LEUKOCYTESUR NEGATIVE 11/01/2016 2000   Recent Results (from the past 240 hour(s))  MRSA PCR Screening     Status: None   Collection Time: 11/29/16  6:50 AM  Result Value Ref Range Status   MRSA by PCR NEGATIVE NEGATIVE Final    Comment:        The GeneXpert MRSA Assay (FDA approved for NASAL specimens only), is one component of a comprehensive MRSA colonization surveillance program. It is not intended to diagnose MRSA infection nor to guide or monitor treatment for MRSA infections.       Radiology  Studies: No results found. Scheduled Meds: . aspirin EC  81 mg Oral Daily  . atorvastatin  10 mg Oral Daily  . carvedilol  12.5 mg Oral BID WC  . dicyclomine  20 mg Oral TID AC & HS  . escitalopram  20 mg Oral QHS  . feeding supplement (GLUCERNA SHAKE)  237 mL Oral BID BM  . ferrous sulfate  325 mg Oral BID WC  . furosemide  40 mg Oral BID  . gabapentin  100 mg Oral  QHS  . heparin  5,000 Units Subcutaneous Q8H  . hydrALAZINE  25 mg Oral Q8H  . insulin aspart  0-5 Units Subcutaneous QHS  . insulin aspart  0-9 Units Subcutaneous TID WC  . insulin aspart  4 Units Subcutaneous TID WC  . insulin aspart  7 Units Subcutaneous Once  . insulin glargine  36 Units Subcutaneous BID  . isosorbide mononitrate  30 mg Oral BID  . pantoprazole  40 mg Oral Daily  . sodium chloride flush  3 mL Intravenous Q12H   Continuous Infusions:    LOS: 2 days   Time spent: 20 minutes   Faye Ramsay, MD [facility] Pager [phone]  If 7PM-7AM, please contact night-coverage [email or web address] Password [email or web address] 12/01/2016, 12:00 PM

## 2016-12-01 NOTE — Care Management Note (Signed)
Case Management Note  Patient Details  Name: Patricia [name] MRN: [id number] Date of Birth: 06/25/1956  Subjective/Objective:      CHF,  HTN, CKD, DM             Action/Plan: Discharge Planning: NCM spoke to pt and states she does work part-time. States she has the [facility] orange card for a discount with PCP and medications. Pt goes to the [facility] and gets meds from clinic. Pt has follow up appt on 12/07/2016 at 1130 am.   PCP Lottie Mussel T MD   Expected Discharge Date:                 Expected Discharge Plan:  Home/Self Care  In-House Referral:  NA  Discharge planning Services  CM Consult  Post Acute Care Choice:  NA Choice offered to:  NA  DME Arranged:  N/A DME Agency:  NA  HH Arranged:  NA HH Agency:  NA  Status of Service:  Completed, signed off  If discussed at [place] of Stay Meetings, dates discussed:    Additional Comments:  Erenest Rasher, RN 12/01/2016, 4:37 PM

## 2016-12-01 NOTE — Progress Notes (Signed)
Progress Note  Patient Name: Patricia [name] Date of Encounter: 12/01/2016  Primary Cardiologist: new - Dr. Sallyanne Kuster  Subjective   Pt reports her SOB is better and she is voiding more frequently than at home, but she still can't lay flat to sleep.  Inpatient Medications    Scheduled Meds: . aspirin EC  81 mg Oral Daily  . atorvastatin  10 mg Oral Daily  . carvedilol  6.25 mg Oral BID WC  . dicyclomine  20 mg Oral TID AC & HS  . escitalopram  20 mg Oral QHS  . feeding supplement (GLUCERNA SHAKE)  237 mL Oral BID BM  . ferrous sulfate  325 mg Oral BID WC  . furosemide  40 mg Intravenous BID  . gabapentin  100 mg Oral QHS  . heparin  5,000 Units Subcutaneous Q8H  . insulin aspart  0-5 Units Subcutaneous QHS  . insulin aspart  0-9 Units Subcutaneous TID WC  . insulin aspart  4 Units Subcutaneous TID WC  . insulin aspart  7 Units Subcutaneous Once  . insulin glargine  36 Units Subcutaneous BID  . pantoprazole  40 mg Oral Daily  . sodium chloride flush  3 mL Intravenous Q12H   Continuous Infusions: . nitroGLYCERIN 10 mcg/min (12/01/16 0700)   PRN Meds: sodium chloride, acetaminophen, ALPRAZolam, alum & mag hydroxide-simeth, hydrALAZINE, HYDROcodone-acetaminophen, ondansetron (ZOFRAN) IV, sodium chloride flush   Vital Signs    Vitals:   11/30/16 2043 11/30/16 2155 12/01/16 0008 12/01/16 0500  BP: (!) 176/89 (!) 159/86 (!) 141/91 (!) 146/87  Pulse: 88 99 85 85  Resp: (!) 21 (!) 30 17 16   Temp: 98.6 F (37 C)  98 F (36.7 C) 97.8 F (36.6 C)  TempSrc: Oral  Oral Oral  SpO2: 99% 99% 94% 98%  Weight:    189 lb (85.7 kg)  Height:        Intake/Output Summary (Last 24 hours) at 12/01/16 0820 Last data filed at 12/01/16 0700  Gross per 24 hour  Intake            320.8 ml  Output              700 ml  Net           -379.2 ml   Filed Weights   11/29/16 0642 11/30/16 0545 12/01/16 0500  Weight: 192 lb (87.1 kg) 186 lb (84.4 kg) 189 lb (85.7 kg)     Physical Exam    General: Well developed, well nourished, female appearing in no acute distress. Head: Normocephalic, atraumatic.  Neck: Supple without bruits, mild JVD. Lungs:  Resp regular and unlabored, CTA but diminished in bases Heart: RRR, S1, S2, S4 present, no murmur; no rub. Abdomen: Soft, non-tender, non-distended with normoactive bowel sounds. No hepatomegaly. No rebound/guarding. No obvious abdominal masses. Extremities: No clubbing, cyanosis, 1+ B LE edema. Distal pedal pulses are 2+ bilaterally. Neuro: Alert and oriented X 3. Moves all extremities spontaneously. Psych: Normal affect.  Labs    Chemistry Recent Labs Lab 11/29/16 0325 11/30/16 0437 12/01/16 0616  NA 133* 138 138  K 4.2 3.2* 3.8  CL 106 110 108  CO2 17* 21* 21*  GLUCOSE 362* 59* 52*  BUN 38* 37* 44*  CREATININE 2.26* 2.35* 2.59*  CALCIUM 8.2* 8.4* 8.1*  PROT 5.4*  --   --   ALBUMIN 2.2*  --   --   AST 62*  --   --   ALT 62*  --   --  ALKPHOS 112  --   --   BILITOT 0.5  --   --   GFRNONAA 23* 22* 19*  GFRAA 26* 25* 22*  ANIONGAP 10 7 9      Hematology Recent Labs Lab 11/29/16 0325 11/29/16 7782 11/30/16 0437 12/01/16 0616  WBC 8.3  --  10.1 8.0  RBC 3.29* 3.50* 3.17* 3.10*  HGB 8.5*  --  8.2* 8.0*  HCT 26.4*  --  25.3* 25.1*  MCV 80.2  --  79.8 81.0  MCH 25.8*  --  25.9* 25.8*  MCHC 32.2  --  32.4 31.9  RDW 15.0  --  15.3 15.3  PLT 291  --  272 259    Cardiac Enzymes Recent Labs Lab 11/29/16 0642 11/29/16 1401 11/29/16 1819  TROPONINI <0.03 0.03* 0.03*    Recent Labs Lab 11/29/16 0346  TROPIPOC 0.02     BNP Recent Labs Lab 11/29/16 0325  BNP 1,295.3*     DDimer No results for input(s): DDIMER in the last 168 hours.   Radiology    No results found.   Telemetry    NSR in the 80s - Personally Reviewed  ECG    No new tracings   Cardiac Studies   Echocardiogram 11/30/16: Study Conclusions - Left ventricle: The cavity size was moderately dilated. Wall   thickness  was increased in a pattern of moderate LVH. Systolic   function was severely reduced. The estimated ejection fraction   was in the range of 25% to 30%. Diffuse hypokinesis. - Mitral valve: There was mild to moderate regurgitation. - Atrial septum: No defect or patent foramen ovale was identified.  Patient Profile     61 y.o. female with a PMH significant for IDDM, HTN, CKD III, depression, anxiety, and CHF. She presented to the [facility] with 2-3 day history of worsening dyspnea at rest. Cardiology was consulted for management of an acute CHF exacerbation.   Assessment & Plan    1. Dyspnea at rest and LE edema / CHF evaluation - BNP on admission was 1295.  Troponin 0.03 --> 0.03. CXR with cardiomegaly - echocardiogram completed with a LVEF of 25-30% and diffuse hypokinesis, likely related to long-standing HTN - she continues to deny anginal symptoms - schedule for OP ischemic evaluation given her new LV dysfunction - continue ASA, coreg (lopressor increased her fatigue) - started on imdur and hydralazine for heart failure (as below) - lipid panel 12/01/16 with better controlled LDL and triglycerides compared to previous labs; continue lipitor - sCr rising: 2.59  (2.35) (2.26) with borderline adequate urine output; consider bladder scan to rule out retention - continue diuresis as sCr allows; with rising sCr, transitioned 40 mg lasix BID from IV to PO - K WNL 3.8 (3.2) - pt is diuresing well with lasix: net negative 1.1L out; weight 189 lbs which is increased from yesterdays weight of 186 lbs (192 lbs at admission) - continue strict I/Os, continue daily weights - please obtain standing weight daily   2. Acute on chronic kidney disease stage III / anemia - sCr  2.59 (2.35) ; baseline appears to be 1.86-2.05 - continue to diurese as sCr allows - consider nephrology consult if sCr doesn't improve to baseline; she will need to follow up with a nephrologist outpatient - hold home lisinopril  while diuresing with lasix - Hb on admission was 8.5, drifting down to 8.1 today; baseline 9-10; likely dilutional given hypervolemia; continue daily labs to monitor   3. HTN - home medications include:  lisinopril, imdur, lopressor - 141/91 - 176/89; on admission: 197/102 - lopressor was changed to coreg yesterday; increased to 12.5 mg BID as she is still HTN in the 140s-170s - continue to hold ACEI with acute on chronic kidney disease  - she is still on nitro gtt; discontinued nitro gtt and added her home imdur at 30 mg BID and new hydralazine 25 mg TID    4. IDDM - A1C 10/2016: 10.1% - per primary team   Signed, Tami Lin Duke , PA-C 8:20 AM 12/01/2016 Pager: [phone]  I have seen and examined the patient along with Ledora Bottcher , PA-C.  I have reviewed the chart, notes and new data.  I agree with PA's note.  Key new complaints: orthopnea has resolved. Edema almost completely gone  Key examination changes: no rales, S3 still present Key new findings / data: creatinine up slightly.  PLAN: Agree with DC IV NTG, start oral nitrates. Discussed sodium restriction, daily weights, diuretic dose adjustment, etc at length. The possibility of diffuse CAD as a cause for her cardiomyopathy remains an issue, but coronary angio is relatively contraindicated due to renal insufficiency. Definitely should not be performed now, when her renal function is worse than baseline. I suggest we reevaluate her EF in 3 months. If no improvement she needs to undergo cardiac cath (and if no revascularization is indicated, should also receive AICD for primary prevention). If EF shows substantial improvement, can forego cardiac cath and ICD.  Sanda Klein, MD, [place] [phone] 12/01/2016, 11:09 AM

## 2016-12-01 NOTE — Evaluation (Signed)
Physical Therapy Evaluation Patient Details Name: Patricia [name] MRN: [id number] DOB: Jun 29, 1956 Today's Date: 12/01/2016   History of Present Illness  Pt is 61 y.o. female with a PMH significant for IDDM, HTN, CKD III, depression, anxiety, and CHF who presented to the [facility] with 2-3 day duration of progressively worsening dyspnea. Chest xray showed cardiomegaly with diffuse pulmonary edema and small left pleural effusion suggesting CHF.  Clinical Impression  Pt pleasant and motivated to work with Pt, presenting with generalized weakness, decreased endurance, and impaired balance secondary to above. PTA, pt indep with all functional mobility and ambulation, living with daughter who can provide supervision as needed. Today, pt amb in hallway with minA and 2x self-corrected LOB; SpO2 remained > 94% on RA with amb. Educ: pursed lip breathing, importance of mobility and taking rest breaks as needed. Pt would benefit from continued acute PT to maximize functional mobility and independence before d/c home.     Follow Up Recommendations No PT follow up;Supervision - Intermittent    Equipment Recommendations  None recommended by PT    Recommendations for Other Services       Precautions / Restrictions Precautions Precautions: None Restrictions Weight Bearing Restrictions: No      Mobility  Bed Mobility Overal bed mobility: Modified Independent             General bed mobility comments: Supine to sit mod ind with hand rail and HOB elevated.   Transfers Overall transfer level: Needs assistance Equipment used: None Transfers: Sit to/from Stand Sit to Stand: Supervision            Ambulation/Gait Ambulation/Gait assistance: Min assist Ambulation Distance (Feet): 50 Feet Assistive device: None Gait Pattern/deviations: Step-through pattern;Decreased step length - right;Decreased step length - left;Decreased stride length Gait velocity: Decreased velocity.  Gait velocity  interpretation: Below normal speed for age/gender General Gait Details: Amb with slowed gait, and 2x self-corrected LOB. Required 1x standing rest break for increased WOB with amb. 2/4 dyspnea scale with amb.   Stairs            Wheelchair Mobility    Modified Rankin (Stroke Patients Only)       Balance Overall balance assessment: Needs assistance Sitting-balance support: No upper extremity supported;Feet supported Sitting balance-Leahy Scale: Fair     Standing balance support: No upper extremity supported;During functional activity Standing balance-Leahy Scale: Fair                               Pertinent Vitals/Pain Pain Assessment: No/denies pain    Home Living Family/patient expects to be discharged to:: Private residence Living Arrangements: Children Available Help at Discharge: Family;Available PRN/intermittently Type of Home: Apartment Home Access: Stairs to enter Entrance Stairs-Rails: Left Entrance Stairs-Number of Steps: 12 Home Layout: One level Home Equipment: None Additional Comments: Has two daughters who can provide as much supervision as needed. Works as Building control surveyor.     Prior Function Level of Independence: Independent         Comments: Pt drives     Hand Dominance        Extremity/Trunk Assessment   Upper Extremity Assessment Upper Extremity Assessment: Generalized weakness    Lower Extremity Assessment Lower Extremity Assessment: Generalized weakness    Cervical / Trunk Assessment Cervical / Trunk Assessment: Normal  Communication   Communication: No difficulties  Cognition Arousal/Alertness: Awake/alert Behavior During Therapy: WFL for tasks assessed/performed Overall Cognitive Status: Within Functional Limits for  tasks assessed                      General Comments General comments (skin integrity, edema, etc.): SpO2 remained >94% on RA with ambulation.     Exercises     Assessment/Plan    PT  Assessment Patient needs continued PT services  PT Problem List Decreased strength;Decreased activity tolerance;Decreased balance;Decreased mobility       PT Treatment Interventions Gait training;Stair training;Functional mobility training;Therapeutic activities;Therapeutic exercise;Balance training;Patient/family education    PT Goals (Current goals can be found in the Care Plan section)  Acute Rehab PT Goals Patient Stated Goal: Return home  PT Goal Formulation: With patient Time For Goal Achievement: 12/15/16 Potential to Achieve Goals: Good    Frequency Min 3X/week   Barriers to discharge        Co-evaluation               End of Session Equipment Utilized During Treatment: Gait belt Activity Tolerance: Patient tolerated treatment well Patient left: in chair;with call bell/phone within reach Nurse Communication: Mobility status PT Visit Diagnosis: Unsteadiness on feet (R26.81);Muscle weakness (generalized) (M62.81)         Time: 1345-1401 PT Time Calculation (min) (ACUTE ONLY): 16 min   Charges:   PT Evaluation $PT Eval Low Complexity: 1 Procedure     PT G Codes:       Enis Gash, SPT Office-[phone]  Mabeline Caras 12/01/2016, 3:56 PM

## 2016-12-02 ENCOUNTER — Other Ambulatory Visit: Payer: Self-pay

## 2016-12-02 DIAGNOSIS — D649 Anemia, unspecified: Secondary | ICD-10-CM

## 2016-12-02 DIAGNOSIS — J810 Acute pulmonary edema: Secondary | ICD-10-CM

## 2016-12-02 LAB — BASIC METABOLIC PANEL
ANION GAP: 8 (ref 5–15)
BUN: 42 mg/dL — ABNORMAL HIGH (ref 6–20)
CALCIUM: 7.9 mg/dL — AB (ref 8.9–10.3)
CHLORIDE: 106 mmol/L (ref 101–111)
CO2: 22 mmol/L (ref 22–32)
Creatinine, Ser: 2.52 mg/dL — ABNORMAL HIGH (ref 0.44–1.00)
GFR calc non Af Amer: 20 mL/min — ABNORMAL LOW (ref >60)
GFR, EST AFRICAN AMERICAN: 23 mL/min — AB (ref >60)
Glucose, Bld: 91 mg/dL (ref 65–99)
Potassium: 4 mmol/L (ref 3.5–5.1)
Sodium: 136 mmol/L (ref 135–145)

## 2016-12-02 LAB — CBC
HCT: 23.5 % — ABNORMAL LOW (ref 36.0–46.0)
HEMOGLOBIN: 7.5 g/dL — AB (ref 12.0–15.0)
MCH: 26.1 pg (ref 26.0–34.0)
MCHC: 31.9 g/dL (ref 30.0–36.0)
MCV: 81.9 fL (ref 78.0–100.0)
Platelets: 221 10*3/uL (ref 150–400)
RBC: 2.87 MIL/uL — AB (ref 3.87–5.11)
RDW: 15.6 % — ABNORMAL HIGH (ref 11.5–15.5)
WBC: 7.8 10*3/uL (ref 4.0–10.5)

## 2016-12-02 LAB — HEMOGLOBIN A1C
Hgb A1c MFr Bld: 8.4 % — ABNORMAL HIGH (ref 4.8–5.6)
Mean Plasma Glucose: 194

## 2016-12-02 LAB — GLUCOSE, CAPILLARY
GLUCOSE-CAPILLARY: 132 mg/dL — AB (ref 65–99)
GLUCOSE-CAPILLARY: 121 mg/dL — AB (ref 65–99)
Glucose-Capillary: 65 mg/dL (ref 65–99)

## 2016-12-02 MED ORDER — INSULIN GLARGINE 100 UNIT/ML ~~LOC~~ SOLN
30.0000 [IU] | Freq: Two times a day (BID) | SUBCUTANEOUS | Status: DC
  Filled 2016-12-02: qty 0.3

## 2016-12-02 MED ORDER — INSULIN GLARGINE 100 UNIT/ML SOLOSTAR PEN: 30.0000 [IU] | PEN_INJECTOR | Freq: Two times a day (BID) | SUBCUTANEOUS | 3 refills | Status: DC

## 2016-12-02 MED ORDER — FERROUS SULFATE 325 (65 FE) MG PO TABS: 325.0000 mg | ORAL_TABLET | Freq: Two times a day (BID) | ORAL | 1 refills | Status: DC

## 2016-12-02 MED ORDER — CARVEDILOL 25 MG PO TABS: 25.0000 mg | ORAL_TABLET | Freq: Two times a day (BID) | ORAL | Status: DC

## 2016-12-02 MED ORDER — FUROSEMIDE 40 MG PO TABS
40.0000 mg | ORAL_TABLET | Freq: Two times a day (BID) | ORAL | 1 refills | Status: DC
Start: 2016-12-02 — End: 2016-12-22

## 2016-12-02 MED ORDER — CARVEDILOL 25 MG PO TABS: 25.0000 mg | ORAL_TABLET | Freq: Two times a day (BID) | ORAL | 1 refills | Status: DC

## 2016-12-02 MED ORDER — HYDRALAZINE HCL 25 MG PO TABS: 25.0000 mg | ORAL_TABLET | Freq: Three times a day (TID) | ORAL | 1 refills | Status: DC

## 2016-12-02 MED ORDER — LIVING BETTER WITH HEART FAILURE BOOK
Freq: Once | Status: AC
  Administered 2016-12-02: 13:00:00

## 2016-12-02 MED FILL — ?FUROSEMIDE 40 MG TABLET: 40 | 30 days supply | Qty: 60 | Fill #0

## 2016-12-02 NOTE — Progress Notes (Signed)
Physical Therapy Treatment Patient Details Name: Patricia [name] MRN: [id number] DOB: [birth year] Today's Date: 12/02/2016    History of Present Illness Pt is 61 y.o. female with a PMH significant for IDDM, HTN, CKD III, depression, anxiety, and CHF who presented to the [facility] with 2-3 day duration of progressively worsening dyspnea. Chest xray showed cardiomegaly with diffuse pulmonary edema and small left pleural effusion suggesting CHF.    PT Comments    Pt pleasant and motivated to work with PT. Able to amb in hallway with 2x self-corrected loss of balance and climb stairs with min guard. TUG score 26 sec indicates high fall risk, which was performed for 1x trial and pt's first time up this afternoon. Discussed possible need of RW for gait stability. Educ: fall prevention, importance of mobility at home with shorter bouts of exercise, dyspnea scale, taking rest breaks, and pursed lip breathing. Pt states both daughters are able to help at home PRN. Pt progressing towards goals. Will continue to follow acutely to maximize balance and endurance.     Follow Up Recommendations  No PT follow up;Supervision - Intermittent     Equipment Recommendations  None recommended by PT    Recommendations for Other Services       Precautions / Restrictions Precautions Precautions: None Restrictions Weight Bearing Restrictions: No    Mobility  Bed Mobility Overal bed mobility: Modified Independent             General bed mobility comments: HOB raised  Transfers Overall transfer level: Independent Equipment used: None Transfers: Sit to/from Stand              Ambulation/Gait Ambulation/Gait assistance: Min guard Ambulation Distance (Feet): 150 Feet Assistive device: None Gait Pattern/deviations: Step-through pattern;Decreased step length - right;Decreased step length - left;Decreased stride length Gait velocity: Decreased velocity.  Gait velocity interpretation: Below normal  speed for age/gender General Gait Details: Slowed gait and 2x self-corrected LOB. 2/4 dyspnea scale which resolved with 2x standing rest break.    Stairs Stairs: Yes   Stair Management: One rail Right Number of Stairs: 11    Wheelchair Mobility    Modified Rankin (Stroke Patients Only)       Balance Overall balance assessment: Needs assistance Sitting-balance support: No upper extremity supported;Feet supported Sitting balance-Leahy Scale: Fair     Standing balance support: No upper extremity supported;During functional activity Standing balance-Leahy Scale: Fair                      Cognition Arousal/Alertness: Awake/alert Behavior During Therapy: WFL for tasks assessed/performed Overall Cognitive Status: Within Functional Limits for tasks assessed                      Exercises      General Comments General comments (skin integrity, edema, etc.): SpO2 remained >96% on RA with ambulation.       Pertinent Vitals/Pain Pain Assessment: No/denies pain    Home Living                      Prior Function            PT Goals (current goals can now be found in the care plan section) Progress towards PT goals: Progressing toward goals    Frequency    Min 3X/week      PT Plan Current plan remains appropriate    Co-evaluation  End of Session Equipment Utilized During Treatment: Gait belt Activity Tolerance: Patient tolerated treatment well Patient left: in chair;with call bell/phone within reach Nurse Communication: Mobility status PT Visit Diagnosis: Unsteadiness on feet (R26.81);Muscle weakness (generalized) (M62.81)     Time: 1350-1409 PT Time Calculation (min) (ACUTE ONLY): 19 min  Charges:  $Gait Training: 8-22 mins                    G Codes:      Enis Gash, SPT Office-[phone]  Mabeline Caras 12/02/2016, 3:05 PM

## 2016-12-02 NOTE — Progress Notes (Signed)
Tech offered Pt a bath, Pt refused and stated she would like to wait until her children arrive. Tech gave Pt all bath supplies.

## 2016-12-02 NOTE — Progress Notes (Deleted)
PROGRESS NOTE  Patricia [name] WCB:[id number] DOB: 1956-02-17 DOA: 11/29/2016 PCP: Maren Reamer, MD   LOS: 3 days   Brief Narrative: 61 yo female with a PMH significant for IDDM, HTN, CKD III, depression, anxiety, and CHF who presented to the [facility] with 2-3 day duration of progressively worsening dyspnea  Assessment & Plan: Principal Problem:   Acute CHF (congestive heart failure) ([place]) Active Problems:   Essential hypertension, benign   Anxiety and depression   CKD (chronic kidney disease) stage 3, GFR 30-59 ml/min   Diabetes mellitus, type II, insulin dependent (HCC)   Normocytic anemia   Transaminasemia   Acute respiratory failure with hypoxia (HCC)   Acute respiratory failure secondary to acute exacerbation of systolic CHF -was on lasix 40 mg IV BID, now converted to p.o. due to creatinine elevation -She is down about 9 pounds since admission -monitor daily weights, strict I/O -per cardiology, coronary angio is relatively contraindicated due to renal insufficiency, should not be performed now -cardiology team suggests we reevaluate pt's EF in 3 months. If no improvement pt needs to undergo cardiac cath (and if no revascularization is indicated, should also receive AICD for primary prevention). If EF shows substantial improvement, can forego cardiac cath and ICD.  HTN - ive urgency -initially started on Nitro drip, now transitioned to Imdur and SBP stable -pt also on Coreg, Lasix, Hydralazine   Acute on CKD (chronic kidney disease) stage 3, GFR 30-59 ml/min -Cr increased with Lasix, now has stabilized around 2.5.  Baseline 1.8-2.  This may be a new baseline for her. -Responding well to p.o. 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  Hypokalemia -supplemented and WNL this AM   Hyponatremia -now WNL    DVT prophylaxis: heparin Code Status: Full Family Communication: no family bedside Disposition Plan: home when cleared by cards  Consultants:   Cardiology   Procedures:   2D echo  Antimicrobials:  None    Subjective: - no chest pain, shortness of breath, no abdominal pain, nausea or vomiting.    Objective: Vitals:   12/02/16 0031 12/02/16 0427 12/02/16 0734 12/02/16 1148  BP: 131/63 (!) 146/83 (!) 156/78 134/67  Pulse: 91 96  95  Resp: 18 19  (!) 24  Temp: 98.4 F (36.9 C) 98.5 F (36.9 C) 98 F (36.7 C) 98.5 F (36.9 C)  TempSrc: Oral Oral Oral Oral  SpO2: 99% 96%  98%  Weight:  83.2 kg (183 lb 6.4 oz)    Height:        Intake/Output Summary (Last 24 hours) at 12/02/16 1315 Last data filed at 12/02/16 1149  Gross per 24 hour  Intake            606.2 ml  Output              800 ml  Net           -193.8 ml   Filed Weights   11/30/16 0545 12/01/16 0500 12/02/16 0427  Weight: 84.4 kg (186 lb) 85.7 kg (189 lb) 83.2 kg (183 lb 6.4 oz)    Examination: Constitutional: NAD Vitals:   12/02/16 0031 12/02/16 0427 12/02/16 0734 12/02/16 1148  BP: 131/63 (!) 146/83 (!) 156/78 134/67  Pulse: 91 96  95  Resp: 18 19  (!) 24  Temp: 98.4 F (36.9 C) 98.5 F (36.9 C) 98 F (36.7 C) 98.5 F (36.9 C)  TempSrc: Oral Oral Oral Oral  SpO2: 99% 96%  98%  Weight:  83.2 kg (183 lb 6.4 oz)    Height:       Eyes: PERRL, lids and conjunctivae normal Respiratory: clear to auscultation bilaterally, no wheezing, no crackles.  Cardiovascular: Regular rate and rhythm, no murmurs / rubs / gallops. 1+ LE edema. 2+ pedal pulses.  Abdomen: no tenderness. Bowel sounds positive.  Musculoskeletal: no clubbing / cyanosis. No joint deformity upper and lower extremities. No contractures. Normal  muscle tone.  Skin: no rashes, lesions, ulcers. No induration Neurologic: non focal    Data Reviewed: I have personally reviewed following labs and imaging studies  CBC:  Recent Labs Lab 11/29/16 0325 11/30/16 0437 12/01/16 0616 12/02/16 0538  WBC 8.3 10.1 8.0 7.8  NEUTROABS 5.4  --   --   --   HGB 8.5* 8.2* 8.0* 7.5*  HCT 26.4* 25.3* 25.1* 23.5*  MCV 80.2 79.8 81.0 81.9  PLT 291 272 259 976   Basic Metabolic Panel:  Recent Labs Lab 11/29/16 0325 11/30/16 0437 12/01/16 0616 12/02/16 0538  NA 133* 138 138 136  K 4.2 3.2* 3.8 4.0  CL 106 110 108 106  CO2 17* 21* 21* 22  GLUCOSE 362* 59* 52* 91  BUN 38* 37* 44* 42*  CREATININE 2.26* 2.35* 2.59* 2.52*  CALCIUM 8.2* 8.4* 8.1* 7.9*   GFR: Estimated Creatinine Clearance: 25.9 mL/min (by C-G formula based on SCr of 2.52 mg/dL (H)). Liver Function Tests:  Recent Labs Lab 11/29/16 0325  AST 62*  ALT 62*  ALKPHOS 112  BILITOT 0.5  PROT 5.4*  ALBUMIN 2.2*   No results for input(s): LIPASE, AMYLASE in the last 168 hours. No results for input(s): AMMONIA in the last 168 hours. Coagulation Profile: No results for input(s): INR, PROTIME in the last 168 hours. Cardiac Enzymes:  Recent Labs Lab 11/29/16 0642 11/29/16 1401 11/29/16 1819  TROPONINI <0.03 0.03* 0.03*   BNP (last 3 results) No results for input(s): PROBNP in the last 8760 hours. HbA1C:  Recent Labs  12/01/16 0616  HGBA1C 8.4*   CBG:  Recent Labs Lab 12/01/16 1700 12/01/16 2112 12/02/16 0148 12/02/16 0733 12/02/16 1148  GLUCAP 182* 181* 132* 65 121*   Lipid Profile:  Recent Labs  12/01/16 0616  CHOL 144  HDL 36*  LDLCALC 82  TRIG 129  CHOLHDL 4.0   Thyroid Function Tests: No results for input(s): TSH, T4TOTAL, FREET4, T3FREE, THYROIDAB in the last 72 hours. Anemia Panel: No results for input(s): VITAMINB12, FOLATE, FERRITIN, TIBC, IRON, RETICCTPCT in the last 72 hours. Urine analysis:    Component Value Date/Time    COLORURINE YELLOW 11/01/2016 2000   APPEARANCEUR CLEAR 11/01/2016 2000   LABSPEC 1.015 11/01/2016 2000   PHURINE 6.0 11/01/2016 2000   GLUCOSEU >=500 (A) 11/01/2016 2000   HGBUR NEGATIVE 11/01/2016 2000   BILIRUBINUR NEGATIVE 11/01/2016 2000   BILIRUBINUR 1.0 10/09/2016 1306   KETONESUR NEGATIVE 11/01/2016 2000   PROTEINUR >=300 (A) 11/01/2016 2000   UROBILINOGEN 1.0 10/09/2016 1306   UROBILINOGEN 0.2 11/04/2014 1530   NITRITE NEGATIVE 11/01/2016 2000   LEUKOCYTESUR NEGATIVE 11/01/2016 2000   Sepsis Labs: Invalid input(s): PROCALCITONIN, LACTICIDVEN  Recent Results (from the past 240 hour(s))  MRSA PCR Screening  Status: None   Collection Time: 11/29/16  6:50 AM  Result Value Ref Range Status   MRSA by PCR NEGATIVE NEGATIVE Final    Comment:        The GeneXpert MRSA Assay (FDA approved for NASAL specimens only), is one component of a comprehensive MRSA colonization surveillance program. It is not intended to diagnose MRSA infection nor to guide or monitor treatment for MRSA infections.     Radiology Studies: No results found.  Scheduled Meds: . aspirin EC  81 mg Oral Daily  . atorvastatin  10 mg Oral Daily  . carvedilol  25 mg Oral BID WC  . dicyclomine  20 mg Oral TID AC & HS  . escitalopram  20 mg Oral QHS  . feeding supplement (GLUCERNA SHAKE)  237 mL Oral BID BM  . ferrous sulfate  325 mg Oral BID WC  . furosemide  40 mg Oral BID  . gabapentin  100 mg Oral QHS  . heparin  5,000 Units Subcutaneous Q8H  . hydrALAZINE  25 mg Oral Q8H  . insulin aspart  0-5 Units Subcutaneous QHS  . insulin aspart  0-9 Units Subcutaneous TID WC  . insulin aspart  4 Units Subcutaneous TID WC  . insulin aspart  7 Units Subcutaneous Once  . insulin glargine  30 Units Subcutaneous BID  . isosorbide mononitrate  30 mg Oral BID  . pantoprazole  40 mg Oral Daily  . sodium chloride flush  3 mL Intravenous Q12H   Continuous Infusions:  Marzetta Board, MD, PhD [facility] Pager [phone]  If 7PM-7AM, please contact night-coverage [email or web address] Password [email or web address] 12/02/2016, 1:15 PM

## 2016-12-02 NOTE — Discharge Instructions (Addendum)
Diabetes Mellitus and Food It is important for you to manage your blood sugar (glucose) level. Your blood glucose level can be greatly affected by what you eat. Eating healthier foods in the appropriate amounts throughout the day at about the same time each day will help you control your blood glucose level. It can also help slow or prevent worsening of your diabetes mellitus. Healthy eating Gouge even help you improve the level of your blood pressure and reach or maintain a healthy weight. General recommendations for healthful eating and cooking habits include:  Eating meals and snacks regularly. Avoid going long periods of time without eating to lose weight.  Eating a diet that consists mainly of plant-based foods, such as fruits, vegetables, nuts, legumes, and whole grains.  Using low-heat cooking methods, such as baking, instead of high-heat cooking methods, such as deep frying. Work with your dietitian to make sure you understand how to use the Nutrition Facts information on food labels. How can food affect me? Carbohydrates  Carbohydrates affect your blood glucose level more than any other type of food. Your dietitian will help you determine how many carbohydrates to eat at each meal and teach you how to count carbohydrates. Counting carbohydrates is important to keep your blood glucose at a healthy level, especially if you are using insulin or taking certain medicines for diabetes mellitus. Alcohol  Alcohol can cause sudden decreases in blood glucose (hypoglycemia), especially if you use insulin or take certain medicines for diabetes mellitus. Hypoglycemia can be a life-threatening condition. Symptoms of hypoglycemia (sleepiness, dizziness, and disorientation) are similar to symptoms of having too much alcohol. If your health care provider has given you approval to drink alcohol, do so in moderation and use the following guidelines:  Women should not have more than one drink per day, and men  should not have more than two drinks per day. One drink is equal to:  12 oz of beer.  5 oz of wine.  1 oz of hard liquor.  Do not drink on an empty stomach.  Keep yourself hydrated. Have water, diet soda, or unsweetened iced tea.  Regular soda, juice, and other mixers might contain a lot of carbohydrates and should be counted. What foods are not recommended? As you make food choices, it is important to remember that all foods are not the same. Some foods have fewer nutrients per serving than other foods, even though they might have the same number of calories or carbohydrates. It is difficult to get your body what it needs when you eat foods with fewer nutrients. Examples of foods that you should avoid that are high in calories and carbohydrates but low in nutrients include:  Trans fats (most processed foods list trans fats on the Nutrition Facts label).  Regular soda.  Juice.  Candy.  Sweets, such as cake, pie, doughnuts, and cookies.  Fried foods. What foods can I eat? Eat nutrient-rich foods, which will nourish your body and keep you healthy. The food you should eat also will depend on several factors, including:  The calories you need.  The medicines you take.  Your weight.  Your blood glucose level.  Your blood pressure level.  Your cholesterol level. You should eat a variety of foods, including:  Protein.  Lean cuts of meat.  Proteins low in saturated fats, such as fish, egg whites, and beans. Avoid processed meats.  Fruits and vegetables.  Fruits and vegetables that [place] help control blood glucose levels, such as apples, mangoes, and  yams.  Dairy products.  Choose fat-free or low-fat dairy products, such as milk, yogurt, and cheese.  Grains, bread, pasta, and rice.  Choose whole grain products, such as multigrain bread, whole oats, and brown rice. These foods may help control blood pressure.  Fats.  Foods containing healthful fats, such as nuts,  avocado, olive oil, canola oil, and fish. Does everyone with diabetes mellitus have the same meal plan? Because every person with diabetes mellitus is different, there is not one meal plan that works for everyone. It is very important that you meet with a dietitian who will help you create a meal plan that is just right for you. This information is not intended to replace advice given to you by your health care provider. Make sure you discuss any questions you have with your health care provider. Document Released: 06/25/2005 Document Revised: 03/05/2016 Document Reviewed: 08/25/2013 Elsevier Interactive Patient Education  2017 [place]. Heart-Healthy Eating Plan Many factors influence your heart health, including eating and exercise habits. Heart (coronary) risk increases with abnormal blood fat (lipid) levels. Heart-healthy meal planning includes limiting unhealthy fats, increasing healthy fats, and making other small dietary changes. This includes maintaining a healthy body weight to help keep lipid levels within a normal range. What is my plan? Your health care provider recommends that you:  Get no more than _________% of the total calories in your daily diet from fat.  Limit your intake of saturated fat to less than _________% of your total calories each day.  Limit the amount of cholesterol in your diet to less than _________ mg per day. What types of fat should I choose?  Choose healthy fats more often. Choose monounsaturated and polyunsaturated fats, such as olive oil and canola oil, flaxseeds, walnuts, almonds, and seeds.  Eat more omega-3 fats. Good choices include salmon, mackerel, sardines, tuna, flaxseed oil, and ground flaxseeds. Aim to eat fish at least two times each week.  Limit saturated fats. Saturated fats are primarily found in animal products, such as meats, butter, and cream. Plant sources of saturated fats include palm oil, palm kernel oil, and coconut oil.  Avoid  foods with partially hydrogenated oils in them. These contain trans fats. Examples of foods that contain trans fats are stick margarine, some tub margarines, cookies, crackers, and other baked goods. What general guidelines do I need to follow?  Check food labels carefully to identify foods with trans fats or high amounts of saturated fat.  Fill one half of your plate with vegetables and green salads. Eat 4-5 servings of vegetables per day. A serving of vegetables equals 1 cup of raw leafy vegetables,  cup of raw or cooked cut-up vegetables, or  cup of vegetable juice.  Fill one fourth of your plate with whole grains. Look for the word "whole" as the first word in the ingredient list.  Fill one fourth of your plate with lean protein foods.  Eat 4-5 servings of fruit per day. A serving of fruit equals one medium whole fruit,  cup of dried fruit,  cup of fresh, frozen, or canned fruit, or  cup of 100% fruit juice.  Eat more foods that contain soluble fiber. Examples of foods that contain this type of fiber are apples, broccoli, carrots, beans, peas, and barley. Aim to get 20-30 g of fiber per day.  Eat more home-cooked food and less restaurant, buffet, and fast food.  Limit or avoid alcohol.  Limit foods that are high in starch and sugar.  Avoid fried foods.  Cook foods by using methods other than frying. Baking, boiling, grilling, and broiling are all great options. Other fat-reducing suggestions include:  Removing the skin from poultry.  Removing all visible fats from meats.  Skimming the fat off of stews, soups, and gravies before serving them.  Steaming vegetables in water or broth.  Lose weight if you are overweight. Losing just 5-10% of your initial body weight can help your overall health and prevent diseases such as diabetes and heart disease.  Increase your consumption of nuts, legumes, and seeds to 4-5 servings per week. One serving of dried beans or legumes equals   cup after being cooked, one serving of nuts equals 1 ounces, and one serving of seeds equals  ounce or 1 tablespoon.  You may need to monitor your salt (sodium) intake, especially if you have high blood pressure. Talk with your health care provider or dietitian to get more information about reducing sodium. What foods can I eat? Grains  Breads, including [place], white, pita, wheat, raisin, rye, oatmeal, and [place]. Tortillas that are neither fried nor made with lard or trans fat. Low-fat rolls, including hotdog and hamburger buns and English muffins. Biscuits. Muffins. Waffles. Pancakes. Light popcorn. Whole-grain cereals. Flatbread. Melba toast. Pretzels. Breadsticks. Rusks. Low-fat snacks and crackers, including oyster, saltine, matzo, graham, animal, and rye. Rice and pasta, including brown rice and those that are made with whole wheat. Vegetables  All vegetables. Fruits  All fruits, but limit coconut. Meats and Other Protein Sources  Lean, well-trimmed beef, veal, pork, and lamb. Chicken and [place] without skin. All fish and shellfish. Wild duck, rabbit, pheasant, and venison. Egg whites or low-cholesterol egg substitutes. Dried beans, peas, lentils, and tofu.Seeds and most nuts. Dairy  Low-fat or nonfat cheeses, including ricotta, string, and mozzarella. Skim or 1% milk that is liquid, powdered, or evaporated. Buttermilk that is made with low-fat milk. Nonfat or low-fat yogurt. Beverages  Mineral water. Diet carbonated beverages. Sweets and Desserts  [place] and fruit ices. Honey, jam, marmalade, jelly, and syrups. Meringues and gelatins. Pure sugar candy, such as hard candy, jelly beans, gumdrops, mints, marshmallows, and small amounts of dark chocolate. [employer] Eat all sweets and desserts in moderation. Fats and Oils  Nonhydrogenated (trans-free) margarines. Vegetable oils, including soybean, sesame, sunflower, olive, peanut, safflower, corn, canola, and cottonseed. Salad  dressings or mayonnaise that are made with a vegetable oil. Limit added fats and oils that you use for cooking, baking, salads, and as spreads. Other  Cocoa powder. Coffee and tea. All seasonings and condiments. The items listed above may not be a complete list of recommended foods or beverages. Contact your dietitian for more options.  What foods are not recommended? Grains  Breads that are made with saturated or trans fats, oils, or whole milk. Croissants. Butter rolls. Cheese breads. Sweet rolls. Donuts. Buttered popcorn. Chow mein noodles. High-fat crackers, such as cheese or butter crackers. Meats and Other Protein Sources  Fatty meats, such as hotdogs, short ribs, sausage, spareribs, bacon, ribeye roast or steak, and mutton. High-fat deli meats, such as salami and bologna. Caviar. Domestic duck and goose. Organ meats, such as kidney, liver, sweetbreads, brains, gizzard, chitterlings, and heart. Dairy  Cream, sour cream, cream cheese, and creamed cottage cheese. Whole milk cheeses, including blue (bleu), Monte[place] Jack, [place], [place], American, [place], [place], [place], [place], and [place]. Whole or 2% milk that is liquid, evaporated, or condensed. Whole buttermilk. Cream sauce or high-fat cheese sauce. Yogurt that is  made from whole milk. Beverages  Regular sodas and drinks with added sugar. Sweets and Desserts  Frosting. Pudding. Cookies. Cakes other than angel food cake. Candy that has milk chocolate or white chocolate, hydrogenated fat, butter, coconut, or unknown ingredients. Buttered syrups. Full-fat ice cream or ice cream drinks. Fats and Oils  Gravy that has suet, meat fat, or shortening. Cocoa butter, hydrogenated oils, palm oil, coconut oil, palm kernel oil. These can often be found in baked products, candy, fried foods, nondairy creamers, and whipped toppings. Solid fats and shortenings, including bacon fat, salt pork, lard, and butter. Nondairy cream substitutes, such as coffee  creamers and sour cream substitutes. Salad dressings that are made of unknown oils, cheese, or sour cream. The items listed above may not be a complete list of foods and beverages to avoid. Contact your dietitian for more information.  This information is not intended to replace advice given to you by your health care provider. Make sure you discuss any questions you have with your health care provider. Document Released: 07/07/2008 Document Revised: 04/17/2016 Document Reviewed: 03/22/2014 Elsevier Interactive Patient Education  2017 [place] blood sugars have been low in the hospital, change Lantus dose to 30 U twice daily for next 2-3 days, and check your CBGs 4 times daily. If your sugars are controlled please continue this dose, if morning CBGs are consistently in the 200s go back to your prior Lantus dose.    Follow with Maren Reamer, MD in 5-7 days  Please get a complete blood count and chemistry panel checked by your Primary MD at your next visit, and again as instructed by your Primary MD. Please get your medications reviewed and adjusted by your Primary MD.  Please request your Primary MD to go over all Hospital Tests and Procedure/Radiological results at the follow up, please get all Hospital records sent to your Prim MD by signing hospital release before you go home.  If you had Pneumonia of Lung problems at the Hospital: Please get a 2 view Chest X ray done in 6-8 weeks after hospital discharge or sooner if instructed by your Primary MD.  If you have Congestive Heart Failure: Please call your Cardiologist or Primary MD anytime you have any of the following symptoms:  1) 3 pound weight gain in 24 hours or 5 pounds in 1 week  2) shortness of breath, with or without a dry hacking cough  3) swelling in the hands, feet or stomach  4) if you have to sleep on extra pillows at night in order to breathe  Follow cardiac low salt diet and 1.5 lit/day fluid restriction.  If  you have diabetes Accuchecks 4 times/day, Once in AM empty stomach and then before each meal. Log in all results and show them to your primary doctor at your next visit. If any glucose reading is under 80 or above 300 call your primary MD immediately.  If you have Seizure/Convulsions/Epilepsy: Please do not drive, operate heavy machinery, participate in activities at heights or participate in high speed sports until you have seen by Primary MD or a Neurologist and advised to do so again.  If you had Gastrointestinal Bleeding: Please ask your Primary MD to check a complete blood count within one week of discharge or at your next visit. Your endoscopic/colonoscopic biopsies that are pending at the time of discharge, will also need to followed by your Primary MD.  Get Medicines reviewed and adjusted. Please take all your medications with you  for your next visit with your Primary MD  Please request your Primary MD to go over all hospital tests and procedure/radiological results at the follow up, please ask your Primary MD to get all Hospital records sent to his/her office.  If you experience worsening of your admission symptoms, develop shortness of breath, life threatening emergency, suicidal or homicidal thoughts you must seek medical attention immediately by calling 911 or calling your MD immediately  if symptoms less severe.  You must read complete instructions/literature along with all the possible adverse reactions/side effects for all the Medicines you take and that have been prescribed to you. Take any new Medicines after you have completely understood and accpet all the possible adverse reactions/side effects.   Do not drive or operate heavy machinery when taking Pain medications.   Do not take more than prescribed Pain, Sleep and Anxiety Medications  Special Instructions: If you have smoked or chewed Tobacco  in the last 2 yrs please stop smoking, stop any regular Alcohol  and or any  Recreational drug use.  Wear Seat belts while driving.  Please note You were cared for by a hospitalist during your hospital stay. If you have any questions about your discharge medications or the care you received while you were in the hospital after you are discharged, you can call the unit and asked to speak with the hospitalist on call if the hospitalist that took care of you is not available. Once you are discharged, your primary care physician will handle any further medical issues. Please note that NO REFILLS for any discharge medications will be authorized once you are discharged, as it is imperative that you return to your primary care physician (or establish a relationship with a primary care physician if you do not have one) for your aftercare needs so that they can reassess your need for medications and monitor your lab values.  You can reach the hospitalist office at phone [phone] or fax [phone]   If you do not have a primary care physician, you can call [phone] for a physician referral.  Activity: As tolerated with Full fall precautions use walker/cane & assistance as needed  Diet: heart healthy  Disposition Home

## 2016-12-02 NOTE — Progress Notes (Signed)
Progress Note  Patient Name: Patricia [name] Date of Encounter: 12/02/2016  Primary Cardiologist: Dr. Sallyanne Kuster  Subjective   Patient is feeling well; denies chest pain, dizziness, and palpitations. She reports being able to sleep more reclined last night with less SOB.   Inpatient Medications    Scheduled Meds: . aspirin EC  81 mg Oral Daily  . atorvastatin  10 mg Oral Daily  . carvedilol  12.5 mg Oral BID WC  . dicyclomine  20 mg Oral TID AC & HS  . escitalopram  20 mg Oral QHS  . feeding supplement (GLUCERNA SHAKE)  237 mL Oral BID BM  . ferrous sulfate  325 mg Oral BID WC  . furosemide  40 mg Oral BID  . gabapentin  100 mg Oral QHS  . heparin  5,000 Units Subcutaneous Q8H  . hydrALAZINE  25 mg Oral Q8H  . insulin aspart  0-5 Units Subcutaneous QHS  . insulin aspart  0-9 Units Subcutaneous TID WC  . insulin aspart  4 Units Subcutaneous TID WC  . insulin aspart  7 Units Subcutaneous Once  . insulin glargine  36 Units Subcutaneous BID  . isosorbide mononitrate  30 mg Oral BID  . pantoprazole  40 mg Oral Daily  . sodium chloride flush  3 mL Intravenous Q12H   Continuous Infusions:  PRN Meds: sodium chloride, acetaminophen, ALPRAZolam, alum & mag hydroxide-simeth, hydrALAZINE, HYDROcodone-acetaminophen, ondansetron (ZOFRAN) IV, sodium chloride flush   Vital Signs    Vitals:   12/01/16 2156 12/02/16 0031 12/02/16 0427 12/02/16 0734  BP: (!) 162/82 131/63 (!) 146/83 (!) 156/78  Pulse:  91 96   Resp:  18 19   Temp:  98.4 F (36.9 C) 98.5 F (36.9 C) 98 F (36.7 C)  TempSrc:  Oral Oral Oral  SpO2:  99% 96%   Weight:   183 lb 6.4 oz (83.2 kg)   Height:        Intake/Output Summary (Last 24 hours) at 12/02/16 1058 Last data filed at 12/02/16 0600  Gross per 24 hour  Intake           1206.2 ml  Output              950 ml  Net            256.2 ml   Filed Weights   11/30/16 0545 12/01/16 0500 12/02/16 0427  Weight: 186 lb (84.4 kg) 189 lb (85.7 kg) 183 lb 6.4 oz  (83.2 kg)     Physical Exam   General: Well developed, well nourished, female appearing in no acute distress. Head: Normocephalic, atraumatic.  Neck: Supple without bruits, moderate JVD Lungs:  Resp regular and unlabored, CTA. Heart: RRR, S1, S2, S4 present, no murmur; no rub. Abdomen: Soft, non-tender, non-distended with normoactive bowel sounds. No hepatomegaly. No rebound/guarding. No obvious abdominal masses. Extremities: No clubbing, cyanosis, 1+ edema. Distal pedal pulses are 2+ bilaterally. Neuro: Alert and oriented X 3. Moves all extremities spontaneously. Psych: Normal affect.  Labs    Chemistry Recent Labs Lab 11/29/16 0325 11/30/16 0437 12/01/16 0616 12/02/16 0538  NA 133* 138 138 136  K 4.2 3.2* 3.8 4.0  CL 106 110 108 106  CO2 17* 21* 21* 22  GLUCOSE 362* 59* 52* 91  BUN 38* 37* 44* 42*  CREATININE 2.26* 2.35* 2.59* 2.52*  CALCIUM 8.2* 8.4* 8.1* 7.9*  PROT 5.4*  --   --   --   ALBUMIN 2.2*  --   --   --  AST 62*  --   --   --   ALT 62*  --   --   --   ALKPHOS 112  --   --   --   BILITOT 0.5  --   --   --   GFRNONAA 23* 22* 19* 20*  GFRAA 26* 25* 22* 23*  ANIONGAP 10 7 9 8      Hematology Recent Labs Lab 11/30/16 0437 12/01/16 0616 12/02/16 0538  WBC 10.1 8.0 7.8  RBC 3.17* 3.10* 2.87*  HGB 8.2* 8.0* 7.5*  HCT 25.3* 25.1* 23.5*  MCV 79.8 81.0 81.9  MCH 25.9* 25.8* 26.1  MCHC 32.4 31.9 31.9  RDW 15.3 15.3 15.6*  PLT 272 259 221    Cardiac Enzymes Recent Labs Lab 11/29/16 0642 11/29/16 1401 11/29/16 1819  TROPONINI <0.03 0.03* 0.03*    Recent Labs Lab 11/29/16 0346  TROPIPOC 0.02     BNP Recent Labs Lab 11/29/16 0325  BNP 1,295.3*     DDimer No results for input(s): DDIMER in the last 168 hours.   Radiology    No results found.   Telemetry    NSR in 80s - Personally Reviewed  ECG    No new tracings   Cardiac Studies   Echocardiogram 11/30/16: Study Conclusions - Left ventricle: The cavity size was  moderately dilated. Wall thickness was increased in a pattern of moderate LVH. Systolic function was severely reduced. The estimated ejection fraction was in the range of 25% to 30%. Diffuse hypokinesis. - Mitral valve: There was mild to moderate regurgitation. - Atrial septum: No defect or patent foramen ovale was identified.  Patient Profile     61 y.o. female with a PMH significant for IDDM, HTN, CKD III, depression, anxiety, and CHF. She presented to the [facility] with 2-3 day history of worsening dyspnea at rest. Cardiology was consulted for management of an acute CHF exacerbation.   Assessment & Plan    1. Dyspnea at rest and LE edema / acute systolic heart failure - BNP on admission was 1295. Troponin 0.03 -->0.03. CXR with cardiomegaly - echocardiogram completed with a LVEF of 25-30% and diffuse hypokinesis, likely related to long-standing HTN - she continues to deny anginal symptoms - schedule for OP ischemic evaluation given her new LV dysfunction - will review at OP follow up in our clinic for myoview vs cardiac CT - continue ASA, coreg (lopressor increased her fatigue) - started on imdur and hydralazine for heart failure (as below) - lipid panel 12/01/16 with better controlled LDL and triglycerides compared to previous labs; continue lipitor - sCr may be at a plateau: 2.52 (2.59) with much better urine output - continue diuresis as sCr allows; continue 40 mg PO lasix BID  - K WNL 4.0 - pt is diuresing well with lasix: 1.6 L urine output yesterday; net negative 1.2L out; weight 183 lbs (192 lbs at admission) - continue strict I/Os, continue daily weights - please obtain standing weight daily   2. Acute on chronic kidney disease stage III  - sCr 2.52 (2.59) ; baseline appears to be 1.86-2.05 - continue to diurese as sCr allows - she will need to follow up with a nephrologist outpatient - hold home lisinopril while diuresing with lasix   3. Iron deficient anemia - Hb  continues to fall: 7.5 (8.0); was 8.7 on 11/01/16 - continue iron and add vitamin C - per primary team   4. HTN - home medications include: lisinopril, imdur, lopressor - 131/63 -  172/93; on admission: 197/102 - increased coreg to 25 mg BID for better pressure control - continue to hold ACEI with acute on chronic kidney disease  - she is doing well on hydralazine and imdur - increased    5. IDDM - A1C 10/2016: 10.1% - per primary team   6. Proteinuria - UA on 11/01/16 with proteinuria - consider OP 24 hr urine protein collection   Signed, Ledora Bottcher , PA-C 10:58 AM 12/02/2016 Pager: [phone]  I have seen and examined the patient along with Ledora Bottcher , PA-C.  I have reviewed the chart, notes and new data.  I agree with PA's note.  Key new complaints: breathing much better Key examination changes: improved edema Key new findings / data: creeat stabilized at 2.5, Hgb still slowly dropping.  PLAN: Needs early CHF f/u (TCM visit). Reinforce sodium restriction and daily weights, bring log to office. Recheck echo at 3 months. If no improvement she needs to undergo cardiac cath (and if no revascularization is indicated, should also receive AICD for primary prevention). If EF shows substantial improvement, can forego cardiac cath and ICD.  Sanda Klein, MD, [place] [phone] 12/02/2016, 1:11 PM

## 2016-12-02 NOTE — Discharge Summary (Signed)
Physician Discharge Summary  Patricia [name] EZM:[id number] DOB: 1955/10/19 DOA: 11/29/2016  PCP: Maren Reamer, MD  Admit date: 11/29/2016 Discharge date: 12/02/2016  Admitted From: home  Disposition:  Home   Recommendations for Outpatient Follow-up:  1. Follow up with PCP in 1-2 weeks 2. Follow up with Cardiology as scheduled  Home Health: none Equipment/Devices: none  Discharge Condition: stable CODE STATUS: Full Diet recommendation: heart healthy, diabetic  HPI: Patricia [name] is a 61 y.o. female with medical history significant for insulin-dependent diabetes mellitus, hypertension, chronic kidney disease stage III, and depression with anxiety who presents to the emergency department with 2-3 days of dyspnea, worsening acutely overnight. Patient reports that she noted the insidious development of exertional dyspnea roughly 3 days ago, and describes it is progressively worsening since then. This has been accompanied by bilateral lower extremity edema which began a little over a month ago and was evaluated by her PCP with negative bilateral lower extremity venous Dopplers and a normal BNP. She reports that the leg swelling has slowly worsened. Last night, patient was experiencing orthopnea and PND. She was unable to sleep and had increasing dyspnea while at rest. She denies any chest pain or palpitations and denies any history of heart attack. She denies any significant alcohol use. Patient denies any fevers or chills, abdominal pain, vomiting, or diarrhea.    Hospital Course: Discharge Diagnoses:  Principal Problem:   Acute CHF (congestive heart failure) (HCC) Active Problems:   Essential hypertension, benign   Anxiety and depression   CKD (chronic kidney disease) stage 3, GFR 30-59 ml/min   Diabetes mellitus, type II, insulin dependent (HCC)   Normocytic anemia   Transaminasemia   Acute respiratory failure with hypoxia (HCC)  Acute respiratory failure secondary to acute  exacerbation of systolic CHF -was on lasix 40 mg IV BID, with good urine output, she lost about 9 pounds, was seen by cardiology who followed her while hospitalized.  Her IV Lasix was transitioned to p.o. she was discharge home in stable condition with close cardiology outpatient follow-up. Per cardiology, coronary angio is relatively contraindicated due to renal insufficiency, should not be performed now, cardiology team suggestswe reevaluate pt'sEF in 3 months. If no improvement ptneeds to undergo cardiac cath (and if no revascularization is indicated, should also receive AICD for primary prevention). If EF shows substantial improvement, can forego cardiac cath and ICD. HTN - ive urgency -initially started on Nitro drip, now transitioned to Imdur and SBP stable. Pt also on Coreg, Lasix, Hydralazine  Acute on CKD (chronic kidney disease) stage 3, GFR 30-59 ml/min - Cr increased with Lasix, now has stabilized around 2.5.  Baseline 1.8-2.  This may be a new baseline for her. Responding well to p.o. Lasix will continue that as an outpatient, will need follow-up with cardiology in a week as already arranged, repeat renal function at that time.  We will stop the ACE inhibitor on discharge. Diabetes mellitus, type II, insulin dependent ([place]) with complications of nephropathy and neuropathy - continue Lantus per home medical regimen, she had several episodes of hypoglycemia while hospitalized, possibly in the setting of renal failure versus changes in her diet when in-house.  To be safe, I decreased her Lantus dose to 30 units twice daily, he was advised to closely monitor his CBGs at home and follow-up with her PCP in 1-2 weeks Normocytic anemia of CKD and IDA -no signs of active bleeding, Iron level 22, addediron supplementation.  Will need repeat CBC at  her next appointment. Obesity -Body mass index is 30.95 kg/m.   Discharge Instructions   Allergies as of 12/02/2016      Reactions   Codone  [hydrocodone] Itching   Norvasc [amlodipine Besylate] Swelling   Lower extremity?   Hydralazine Other (See Comments)   Leg swelling   Sulfa Antibiotics Itching, Rash      Medication List    STOP taking these medications   HYDROcodone-acetaminophen 5-325 MG tablet Commonly known as:  NORCO/VICODIN   lisinopril 5 MG tablet Commonly known as:  PRINIVIL,ZESTRIL   metoprolol tartrate 25 MG tablet Commonly known as:  LOPRESSOR     TAKE these medications   alum & mag hydroxide-simeth 200-200-20 MG/5ML suspension Commonly known as:  MAALOX/MYLANTA Take 15 mLs by mouth every 6 (six) hours as needed for indigestion or heartburn.   aspirin EC 81 MG tablet Take 1 tablet (81 mg total) by mouth daily.   atorvastatin 20 MG tablet Commonly known as:  LIPITOR Take 0.5 tablets (10 mg total) by mouth daily.   carvedilol 25 MG tablet Commonly known as:  COREG Take 1 tablet (25 mg total) by mouth 2 (two) times daily with a meal.   dicyclomine 20 MG tablet Commonly known as:  BENTYL Take 1 tablet (20 mg total) by mouth 4 (four) times daily -  before meals and at bedtime.   escitalopram 20 MG tablet Commonly known as:  LEXAPRO Take 1 tablet (20 mg total) by mouth at bedtime.   ferrous sulfate 325 (65 FE) MG tablet Take 1 tablet (325 mg total) by mouth 2 (two) times daily with a meal.   furosemide 40 MG tablet Commonly known as:  LASIX Take 1 tablet (40 mg total) by mouth 2 (two) times daily.   gabapentin 100 MG capsule Commonly known as:  NEURONTIN Take 1 capsule (100 mg total) by mouth at bedtime.   glipiZIDE 10 MG tablet Commonly known as:  GLUCOTROL TAKE 1 TABLET BY MOUTH 2 TIMES DAILY BEFORE A MEAL.   glucose blood test strip Commonly known as:  TRUE METRIX BLOOD GLUCOSE TEST Use as instructed   hydrALAZINE 25 MG tablet Commonly known as:  APRESOLINE Take 1 tablet (25 mg total) by mouth every 8 (eight) hours.   insulin aspart 100 UNIT/ML injection Commonly known as:   NOVOLOG 10 units before meals; Unless eating small meal, Inject 8 units prior.  Do not inject if not eating.   Insulin Glargine 100 UNIT/ML Solostar Pen Commonly known as:  LANTUS SOLOSTAR Inject 30 Units into the skin 2 (two) times daily. What changed:  how much to take   Insulin Pen Needle 29G X 12.7MM Misc 10 Units by Does not apply route 1 day or 1 dose.   Insulin Syringe-Needle U-100 30G X 5/16" 1 ML Misc Test 3 times daily   isosorbide mononitrate 60 MG 24 hr tablet Commonly known as:  IMDUR Take 1 tablet (60 mg total) by mouth daily.   ondansetron 4 MG tablet Commonly known as:  ZOFRAN Take 1 tablet (4 mg total) by mouth every 8 (eight) hours as needed for nausea or vomiting.   TRUEPLUS LANCETS 28G Misc Use as directed      Follow-up Information    Maren Reamer, MD Follow up.   Specialty:  Internal Medicine Why:  12/07/2016 at 1130 am for follow up with PCP Contact information: [place] [address] [phone]        Rosaria Ferries, PA-C Follow  up on 12/10/2016.   Specialties:  Cardiology, Radiology Why:  9:30 am Contact information: [address] [place] [address] [phone]          Allergies  Allergen Reactions  . Codone [Hydrocodone] Itching  . Norvasc [Amlodipine Besylate] Swelling    Lower extremity?  . Hydralazine Other (See Comments)    Leg swelling  . Sulfa Antibiotics Itching and Rash    Consultations:  CARDIOLOGY    Procedures/Studies:  2D echo Study Conclusions - Left ventricle: The cavity size was moderately dilated. Wall thickness was increased in a pattern of moderate LVH. Systolic function was severely reduced. The estimated ejection fraction was in the range of 25% to 30%. Diffuse hypokinesis. - Mitral valve: There was mild to moderate regurgitation. - Atrial septum: No defect or patent foramen ovale was identified.  Dg Chest 2 View  Result Date: 11/29/2016 CLINICAL DATA:   Initial evaluation for acute shortness of breath, orthopnea. EXAM: CHEST  2 VIEW COMPARISON:  Prior radiograph from 11/04/2016. FINDINGS: Mild cardiomegaly, stable. Mediastinal silhouette within normal limits. Lungs hypoinflated. Diffuse pulmonary vascular congestion with interstitial prominence, most compatible with moderate diffuse pulmonary edema. Small left pleural effusion. No definite focal infiltrates, although they would be difficult to exclude. No pneumothorax. No acute osseous abnormality.  The IMPRESSION: Cardiomegaly with diffuse pulmonary edema and small left pleural effusion, suggesting CHF. Electronically Signed   By: Jeannine Boga M.D.   On: 11/29/2016 04:05   Dg Chest 2 View  Result Date: 11/04/2016 CLINICAL DATA:  Bilateral leg edema. EXAM: CHEST  2 VIEW COMPARISON:  CT 03/28/2008 . FINDINGS: Mediastinum and hilar structures normal. Mild left base subsegmental atelectasis. Mild infiltrate cannot be excluded. Small left base subsegmental atelectasis. Small left pleural effusion. No pneumothorax . IMPRESSION: Mild left base subsegmental atelectasis. Mild infiltrate cannot be excluded. Small left pleural effusion . Electronically Signed   By: Marcello Moores  Register   On: 11/04/2016 12:53     Subjective: - no chest pain, shortness of breath, no abdominal pain, nausea or vomiting.   Discharge Exam: Vitals:   12/02/16 0734 12/02/16 1148  BP: (!) 156/78 134/67  Pulse:  95  Resp:  (!) 24  Temp: 98 F (36.7 C) 98.5 F (36.9 C)   Vitals:   12/02/16 0031 12/02/16 0427 12/02/16 0734 12/02/16 1148  BP: 131/63 (!) 146/83 (!) 156/78 134/67  Pulse: 91 96  95  Resp: 18 19  (!) 24  Temp: 98.4 F (36.9 C) 98.5 F (36.9 C) 98 F (36.7 C) 98.5 F (36.9 C)  TempSrc: Oral Oral Oral Oral  SpO2: 99% 96%  98%  Weight:  83.2 kg (183 lb 6.4 oz)    Height:        General: Pt is alert, awake, not in acute distress Cardiovascular: RRR, S1/S2 +, no rubs, no gallops Respiratory: CTA  bilaterally, no wheezing, no rhonchi Abdominal: Soft, NT, ND, bowel sounds + Extremities: no edema, no cyanosis    The results of significant diagnostics from this hospitalization (including imaging, microbiology, ancillary and laboratory) are listed below for reference.     Microbiology: Recent Results (from the past 240 hour(s))  MRSA PCR Screening     Status: None   Collection Time: 11/29/16  6:50 AM  Result Value Ref Range Status   MRSA by PCR NEGATIVE NEGATIVE Final    Comment:        The GeneXpert MRSA Assay (FDA approved for NASAL specimens only), is one component of a  comprehensive MRSA colonization surveillance program. It is not intended to diagnose MRSA infection nor to guide or monitor treatment for MRSA infections.      Labs: BNP (last 3 results)  Recent Labs  11/04/16 1015 11/29/16 0325  BNP 68.5 7,741.4*   Basic Metabolic Panel:  Recent Labs Lab 11/29/16 0325 11/30/16 0437 12/01/16 0616 12/02/16 0538  NA 133* 138 138 136  K 4.2 3.2* 3.8 4.0  CL 106 110 108 106  CO2 17* 21* 21* 22  GLUCOSE 362* 59* 52* 91  BUN 38* 37* 44* 42*  CREATININE 2.26* 2.35* 2.59* 2.52*  CALCIUM 8.2* 8.4* 8.1* 7.9*   Liver Function Tests:  Recent Labs Lab 11/29/16 0325  AST 62*  ALT 62*  ALKPHOS 112  BILITOT 0.5  PROT 5.4*  ALBUMIN 2.2*   No results for input(s): LIPASE, AMYLASE in the last 168 hours. No results for input(s): AMMONIA in the last 168 hours. CBC:  Recent Labs Lab 11/29/16 0325 11/30/16 0437 12/01/16 0616 12/02/16 0538  WBC 8.3 10.1 8.0 7.8  NEUTROABS 5.4  --   --   --   HGB 8.5* 8.2* 8.0* 7.5*  HCT 26.4* 25.3* 25.1* 23.5*  MCV 80.2 79.8 81.0 81.9  PLT 291 272 259 221   Cardiac Enzymes:  Recent Labs Lab 11/29/16 0642 11/29/16 1401 11/29/16 1819  TROPONINI <0.03 0.03* 0.03*   BNP: Invalid input(s): POCBNP CBG:  Recent Labs Lab 12/01/16 1700 12/01/16 2112 12/02/16 0148 12/02/16 0733 12/02/16 1148  GLUCAP 182* 181*  132* 65 121*   D-Dimer No results for input(s): DDIMER in the last 72 hours. Hgb A1c  Recent Labs  12/01/16 0616  HGBA1C 8.4*   Lipid Profile  Recent Labs  12/01/16 0616  CHOL 144  HDL 36*  LDLCALC 82  TRIG 129  CHOLHDL 4.0   Thyroid function studies No results for input(s): TSH, T4TOTAL, T3FREE, THYROIDAB in the last 72 hours.  Invalid input(s): FREET3 Anemia work up No results for input(s): VITAMINB12, FOLATE, FERRITIN, TIBC, IRON, RETICCTPCT in the last 72 hours. Urinalysis    Component Value Date/Time   COLORURINE YELLOW 11/01/2016 2000   APPEARANCEUR CLEAR 11/01/2016 2000   LABSPEC 1.015 11/01/2016 2000   PHURINE 6.0 11/01/2016 2000   GLUCOSEU >=500 (A) 11/01/2016 2000   HGBUR NEGATIVE 11/01/2016 2000   BILIRUBINUR NEGATIVE 11/01/2016 2000   BILIRUBINUR 1.0 10/09/2016 1306   KETONESUR NEGATIVE 11/01/2016 2000   PROTEINUR >=300 (A) 11/01/2016 2000   UROBILINOGEN 1.0 10/09/2016 1306   UROBILINOGEN 0.2 11/04/2014 1530   NITRITE NEGATIVE 11/01/2016 2000   LEUKOCYTESUR NEGATIVE 11/01/2016 2000   Sepsis Labs Invalid input(s): PROCALCITONIN,  WBC,  LACTICIDVEN Microbiology Recent Results (from the past 240 hour(s))  MRSA PCR Screening     Status: None   Collection Time: 11/29/16  6:50 AM  Result Value Ref Range Status   MRSA by PCR NEGATIVE NEGATIVE Final    Comment:        The GeneXpert MRSA Assay (FDA approved for NASAL specimens only), is one component of a comprehensive MRSA colonization surveillance program. It is not intended to diagnose MRSA infection nor to guide or monitor treatment for MRSA infections.      Time coordinating discharge: 38 minutes  SIGNED:  Marzetta Board, MD  [facility] 12/02/2016, 3:35 PM Pager [phone]  If 7PM-7AM, please contact night-coverage [email or web address] Password [email or web address]

## 2016-12-02 NOTE — Progress Notes (Signed)
Daughter assisted Pt with bath.

## 2016-12-02 NOTE — Progress Notes (Signed)
Pt discharged home. Discharge instructions have been gone over with the patient. IV's removed. Pt given unit number and told to call if they have any concerns regarding their discharge instructions. Marijah Larranaga V, RN   

## 2016-12-03 ENCOUNTER — Other Ambulatory Visit: Payer: Self-pay | Admitting: *Deleted

## 2016-12-03 MED FILL — hydrALAZINE HCL 25 MG TABS: 25 | 3 days supply | Qty: 90 | Fill #0

## 2016-12-03 MED FILL — FERROUS SULFATE 325 MG TAB: 325 (65 FE) | 30 days supply | Qty: 60 | Fill #0

## 2016-12-03 MED FILL — !LANTUS SOLOSTAR 100UNITS/M: 100 | 10 days supply | Qty: 6 | Fill #0

## 2016-12-03 MED FILL — ?CARVEDILOL 25 MG TABLET: 25 | 30 days supply | Qty: 60 | Fill #0

## 2016-12-03 NOTE — Telephone Encounter (Signed)
PASS PROGRAM COMPLETED

## 2016-12-07 ENCOUNTER — Ambulatory Visit: Payer: Self-pay | Attending: Internal Medicine | Admitting: Internal Medicine

## 2016-12-07 ENCOUNTER — Encounter: Payer: Self-pay | Admitting: Internal Medicine

## 2016-12-07 VITALS — BP 126/70 | HR 82 | Temp 97.9°F | Resp 16 | Wt 177.8 lb

## 2016-12-07 DIAGNOSIS — E1122 Type 2 diabetes mellitus with diabetic chronic kidney disease: Secondary | ICD-10-CM | POA: Insufficient documentation

## 2016-12-07 DIAGNOSIS — Z794 Long term (current) use of insulin: Secondary | ICD-10-CM | POA: Insufficient documentation

## 2016-12-07 DIAGNOSIS — Z7982 Long term (current) use of aspirin: Secondary | ICD-10-CM | POA: Insufficient documentation

## 2016-12-07 DIAGNOSIS — F329 Major depressive disorder, single episode, unspecified: Secondary | ICD-10-CM

## 2016-12-07 DIAGNOSIS — I10 Essential (primary) hypertension: Secondary | ICD-10-CM

## 2016-12-07 DIAGNOSIS — F419 Anxiety disorder, unspecified: Secondary | ICD-10-CM | POA: Insufficient documentation

## 2016-12-07 DIAGNOSIS — E1121 Type 2 diabetes mellitus with diabetic nephropathy: Secondary | ICD-10-CM

## 2016-12-07 DIAGNOSIS — N184 Chronic kidney disease, stage 4 (severe): Secondary | ICD-10-CM

## 2016-12-07 DIAGNOSIS — E669 Obesity, unspecified: Secondary | ICD-10-CM | POA: Insufficient documentation

## 2016-12-07 DIAGNOSIS — F32A Depression, unspecified: Secondary | ICD-10-CM

## 2016-12-07 DIAGNOSIS — I130 Hypertensive heart and chronic kidney disease with heart failure and stage 1 through stage 4 chronic kidney disease, or unspecified chronic kidney disease: Secondary | ICD-10-CM | POA: Insufficient documentation

## 2016-12-07 DIAGNOSIS — I5041 Acute combined systolic (congestive) and diastolic (congestive) heart failure: Secondary | ICD-10-CM

## 2016-12-07 LAB — GLUCOSE, POCT (MANUAL RESULT ENTRY): POC Glucose: 136 mg/dl — AB (ref 70–99)

## 2016-12-07 MED FILL — ISOSORBIDE MN ER 60 MG TAB: 60 | 30 days supply | Qty: 30 | Fill #1

## 2016-12-07 MED FILL — METOPROLOL TARTRATE 25 MG T: 25 | 30 days supply | Qty: 60 | Fill #1

## 2016-12-07 MED FILL — ATORVASTATIN 10 MG TABLET: 10 | 30 days supply | Qty: 30 | Fill #1

## 2016-12-07 MED FILL — ESCITALOPRAM 20 MG TABLET: 20 | 30 days supply | Qty: 30 | Fill #1

## 2016-12-07 MED FILL — glipiZIDE 10 MG TABS: 10 | 30 days supply | Qty: 60 | Fill #2

## 2016-12-07 MED FILL — hydrALAZINE HCL 25 MG TABS: 25 | 3 days supply | Qty: 90 | Fill #1

## 2016-12-07 NOTE — Progress Notes (Signed)
Patricia [name], is a 61 y.o. female  CBU:[id number]  EHO:[id number]  DOB - 07-Jan-1956  Chief Complaint  Patient presents with  . Hospitalization Follow-up        Subjective:   Patricia [name] is a 61 y.o. female here today for a follow up visit.  Recent hospitalization 2/18-2/21/18 for acute resp failure 2nd to acute exacerbation of systolic chf.  She was aggressively diuresed w/ lasix iv and ntg gtt, and lost about 9lbs.  Her ef 11/30/16 noted 25-30%, which is new.   Of note, cr increased and stabilized at baseline 2.5, which now puts her in ckd 4 range.   acei currently on hold.  She feels much better overall, but still very easily fatigued. She has f/u cards appt this Thurs.  Says her anxiety/depression well controlled on lexapro.   Patient has No headache, No chest pain, No abdominal pain - No Nausea, No new weakness tingling or numbness, No Cough. Denies orthopnea/pnd/doe today.  Here w/ her dgt.  Problem  Ckd (Chronic Kidney Disease) Stage 4, Gfr 15-29 Ml/Min (Hcc)    ALLERGIES: Allergies  Allergen Reactions  . Codone [Hydrocodone] Itching  . Norvasc [Amlodipine Besylate] Swelling    Lower extremity?  . Hydralazine Other (See Comments)    Leg swelling  . Sulfa Antibiotics Itching and Rash    PAST MEDICAL HISTORY: Past Medical History:  Diagnosis Date  . Diabetes mellitus without complication ([place])   . Hypertension   . Obesity     MEDICATIONS AT HOME: Prior to Admission medications   Medication Sig Start Date End Date Taking? Authorizing Provider  alum & mag hydroxide-simeth (MAALOX/MYLANTA) 200-200-20 MG/5ML suspension Take 15 mLs by mouth every 6 (six) hours as needed for indigestion or heartburn.    Historical Provider, MD  aspirin EC 81 MG tablet Take 1 tablet (81 mg total) by mouth daily. 09/29/16   Maren Reamer, MD  atorvastatin (LIPITOR) 20 MG tablet Take 0.5 tablets (10 mg total) by mouth daily. 09/29/16   Maren Reamer, MD  carvedilol (COREG) 25 MG  tablet Take 1 tablet (25 mg total) by mouth 2 (two) times daily with a meal. 12/02/16   Costin Karlyne Greenspan, MD  dicyclomine (BENTYL) 20 MG tablet Take 1 tablet (20 mg total) by mouth 4 (four) times daily -  before meals and at bedtime. Patient not taking: Reported on 11/29/2016 11/01/16   Duffy Bruce, MD  escitalopram (LEXAPRO) 20 MG tablet Take 1 tablet (20 mg total) by mouth at bedtime. 11/09/16   Maren Reamer, MD  ferrous sulfate 325 (65 FE) MG tablet Take 1 tablet (325 mg total) by mouth 2 (two) times daily with a meal. 12/02/16   Costin Karlyne Greenspan, MD  furosemide (LASIX) 40 MG tablet Take 1 tablet (40 mg total) by mouth 2 (two) times daily. 12/02/16   Costin Karlyne Greenspan, MD  gabapentin (NEURONTIN) 100 MG capsule Take 1 capsule (100 mg total) by mouth at bedtime. 09/29/16   Maren Reamer, MD  glipiZIDE (GLUCOTROL) 10 MG tablet TAKE 1 TABLET BY MOUTH 2 TIMES DAILY BEFORE A MEAL. 09/29/16   Maren Reamer, MD  glucose blood (TRUE METRIX BLOOD GLUCOSE TEST) test strip Use as instructed 09/08/16   Maren Reamer, MD  hydrALAZINE (APRESOLINE) 25 MG tablet Take 1 tablet (25 mg total) by mouth every 8 (eight) hours. 12/02/16   [place], MD  insulin aspart (NOVOLOG) 100 UNIT/ML injection 10 units before meals; Unless eating small  meal, Inject 8 units prior.  Do not inject if not eating. 09/29/16   Maren Reamer, MD  Insulin Glargine (LANTUS SOLOSTAR) 100 UNIT/ML Solostar Pen Inject 30 Units into the skin 2 (two) times daily. 12/02/16   [place], MD  Insulin Pen Needle 29G X 12.7MM MISC 10 Units by Does not apply route 1 day or 1 dose. 01/28/15   Lance Bosch, NP  Insulin Syringe-Needle U-100 30G X 5/16" 1 ML MISC Test 3 times daily 05/29/15   Tresa Garter, MD  isosorbide mononitrate (IMDUR) 60 MG 24 hr tablet Take 1 tablet (60 mg total) by mouth daily. 11/09/16   Maren Reamer, MD  ondansetron (ZOFRAN) 4 MG tablet Take 1 tablet (4 mg total) by mouth every 8 (eight) hours as  needed for nausea or vomiting. 11/01/16   Duffy Bruce, MD  TRUEPLUS LANCETS 28G MISC Use as directed 09/08/16   Maren Reamer, MD     Objective:   Vitals:   12/07/16 1144  BP: 126/70  Pulse: 82  Resp: 16  Temp: 97.9 F (36.6 C)  TempSrc: Oral  SpO2: 98%  Weight: 177 lb 12.8 oz (80.6 kg)    Exam General appearance : Awake, alert, not in any distress. Speech Clear. Not toxic looking,  HEENT: Atraumatic and Normocephalic, pupils equally reactive to light. Neck: supple, no JVD.  Chest:Good air entry bilaterally, no added sounds. CVS: S1 S2 regular, no murmurs/gallups or rubs. Abdomen: Bowel sounds active, Non tender and not distended with no gaurding, rigidity or rebound. Extremities: B/L Lower Ext shows 1+ edema peripherally, both legs are warm to touch Neurology: Awake alert, and oriented X 3, CN II-XII grossly intact, Non focal Skin:No Rash  Data Review Lab Results  Component Value Date   HGBA1C 8.4 (H) 12/01/2016   HGBA1C 10.1 10/21/2016   HGBA1C 13.7 08/10/2016    Depression screen PHQ 2/9 11/23/2016 11/16/2016 10/21/2016 09/29/2016 08/10/2016  Decreased Interest 1 2 2 2 1   Down, Depressed, Hopeless 1 2 2 2 2   PHQ - 2 Score 2 4 4 4 3   Altered sleeping 1 0 0 3 1  Tired, decreased energy 2 2 1 2 3   Change in appetite 1 1 1 2 1   Feeling bad or failure about yourself  1 1 1 3 2   Trouble concentrating 2 1 0 2 1  Moving slowly or fidgety/restless 0 1 0 1 1  Suicidal thoughts 0 0 0 0 0  PHQ-9 Score 9 10 7 17 12   Difficult doing work/chores - - - - -   Echo 11/30/16 Study Conclusions  - Left ventricle: The cavity size was moderately dilated. Wall   thickness was increased in a pattern of moderate LVH. Systolic   function was severely reduced. The estimated ejection fraction   was in the range of 25% to 30%. Diffuse hypokinesis. - Mitral valve: There was mild to moderate regurgitation. - Atrial septum: No defect or patent foramen ovale was  identified.   Assessment & Plan   1. Acute combined systolic and diastolic congestive heart failure (HCC) Ef 25-30%, will need to be reeval in 50months Defer to cards, has fu appt this Thurs. Asa 325 qd Continue Ms Deliah Boston. Low salt diet.  2. CKD (chronic kidney disease) stage 4, GFR 15-29 ml/min (HCC) Needs renal eval, waiting for [facility] card.  Currently on lasix, holding acei for now per cards.  3. Diabetic nephropathy associated with type 2 diabetes mellitus (HCC) - POCT  glucose (manual entry) 136  4. Depression, unspecified depression type Continue lexapro 20  5. Essential hypertension, benign Much better after diuresis.  - on coreg 25bid, lasix 40bid, hydralazine 25tid, imdur 60qd  6. Pending orange card/financial aid.  Patient have been counseled extensively about nutrition and exercise  Return in about 4 weeks (around 01/04/2017), or if symptoms worsen or fail to improve, for sob/lower extremity swelling..  The patient was given clear instructions to go to ER or return to medical center if symptoms don't improve, worsen or new problems develop. The patient verbalized understanding. The patient was told to call to get lab results if they haven't heard anything in the next week.   This note has been created with Surveyor, quantity. Any transcriptional errors are unintentional.   Maren Reamer, MD, [place] [facility] [place], [place]   12/07/2016, 12:22 PM

## 2016-12-10 ENCOUNTER — Encounter: Payer: Self-pay | Admitting: Physician Assistant

## 2016-12-10 ENCOUNTER — Ambulatory Visit (INDEPENDENT_AMBULATORY_CARE_PROVIDER_SITE_OTHER): Payer: Self-pay | Admitting: Physician Assistant

## 2016-12-10 VITALS — BP 111/64 | HR 76 | Ht 65.0 in | Wt 172.4 lb

## 2016-12-10 DIAGNOSIS — I429 Cardiomyopathy, unspecified: Secondary | ICD-10-CM

## 2016-12-10 DIAGNOSIS — I5041 Acute combined systolic (congestive) and diastolic (congestive) heart failure: Secondary | ICD-10-CM

## 2016-12-10 DIAGNOSIS — I10 Essential (primary) hypertension: Secondary | ICD-10-CM

## 2016-12-10 NOTE — Progress Notes (Signed)
Cardiology Office Note   Date:  12/10/2016   ID:  Patricia, [name] Apr 22, 1956, MRN [id number]  PCP:  Patricia Reamer, MD  Cardiologist:  Dr. Sallyanne Kuster, saw during recent hospitalization  Barrett, Patricia Marker, PA-C    History of Present Illness: Patricia [name] is a 61 y.o. female with a history of  IDDM, HTN, CKD III, depression, anxiety, and CHF.  Admitted 11/29/2016-12/02/2016 for CHF, evaluated by cardiology, EF 25-30 percent, outpatient ischemic evaluation recommended, weight 183 pounds at discharge  Patricia [name] presents for outpatient follow up.  She has been doing well since discharge, but still has SOB with exertion. No PND, no orthopnea (had this pre-admission). No LE edema on waking, will sometimes get it during the day.   She has to go up a flight of stairs to get to her apartment. She is going up them, but it is slow. She has been able to walk a block without stopping. She has been getting stronger but she wishes it were faster.   She is cutting the sodium back and using [name].   She has not had chest pain.   She has not had palpitations. No presyncope or syncope.   Her daughter states she feels the patient got sick originally because her sister died.    Past Medical History:  Diagnosis Date  . Diabetes mellitus without complication ([place])   . Hypertension   . Obesity     Past Surgical History:  Procedure Laterality Date  . ABDOMINAL HYSTERECTOMY    . BREAST SURGERY     reduction  . [place]    Medication Sig  . alum & mag hydroxide-simeth (MAALOX/MYLANTA) 200-200-20 MG/5ML suspension Take 15 mLs by mouth every 6 (six) hours as needed for indigestion or heartburn.  Marland Kitchen aspirin EC 81 MG tablet Take 1 tablet (81 mg total) by mouth daily.  Marland Kitchen atorvastatin (LIPITOR) 20 MG tablet Take 0.5 tablets (10 mg total) by mouth daily.  . carvedilol (COREG) 25 MG tablet Take 1 tablet (25 mg total) by mouth 2 (two) times daily with a meal.  . escitalopram  (LEXAPRO) 20 MG tablet Take 1 tablet (20 mg total) by mouth at bedtime.  . ferrous sulfate 325 (65 FE) MG tablet Take 1 tablet (325 mg total) by mouth 2 (two) times daily with a meal.  . furosemide (LASIX) 40 MG tablet Take 1 tablet (40 mg total) by mouth 2 (two) times daily.  Marland Kitchen gabapentin (NEURONTIN) 100 MG capsule Take 1 capsule (100 mg total) by mouth at bedtime.  Marland Kitchen glipiZIDE (GLUCOTROL) 10 MG tablet TAKE 1 TABLET BY MOUTH 2 TIMES DAILY BEFORE A MEAL.  Marland Kitchen glucose blood (TRUE METRIX BLOOD GLUCOSE TEST) test strip Use as instructed  . hydrALAZINE (APRESOLINE) 25 MG tablet Take 1 tablet (25 mg total) by mouth every 8 (eight) hours.  . insulin aspart (NOVOLOG) 100 UNIT/ML injection 10 units before meals; Unless eating small meal, Inject 8 units prior.  Do not inject if not eating.  . Insulin Glargine (LANTUS SOLOSTAR) 100 UNIT/ML Solostar Pen Inject 30 Units into the skin 2 (two) times daily.  . Insulin Pen Needle 29G X 12.7MM MISC 10 Units by Does not apply route 1 day or 1 dose.  . Insulin Syringe-Needle U-100 30G X 5/16" 1 ML MISC Test 3 times daily  . isosorbide mononitrate (IMDUR) 60 MG 24 hr tablet Take 1 tablet (60 mg total) by mouth daily.  . ondansetron (ZOFRAN) 4  MG tablet Take 1 tablet (4 mg total) by mouth every 8 (eight) hours as needed for nausea or vomiting.  . TRUEPLUS LANCETS 28G MISC Use as directed   No current facility-administered medications for this visit.     Allergies:   Codone [hydrocodone]; Norvasc [amlodipine besylate]; Hydralazine; and Sulfa antibiotics    Social History:  The patient  reports that she has never smoked. She has never used smokeless tobacco. She reports that she does not drink alcohol or use drugs.   Family History:  The patient's family history includes Cancer in her brother; Diabetes in her brother.    ROS:  Please see the history of present illness. All other systems are reviewed and negative.    PHYSICAL EXAM: VS:  BP 111/64   Pulse 76    Ht 5\' 5"  (1.651 m)   Wt 172 lb 6.4 oz (78.2 kg)   SpO2 99%   BMI 28.69 kg/m  , BMI Body mass index is 28.69 kg/m. GEN: Well nourished, well developed, female in no acute distress  HEENT: normal for age  Neck: no JVD, no carotid bruit, no masses Cardiac: RRR; no murmur, no rubs, or gallops Respiratory: slightly decreased BS bases but clear to auscultation bilaterally, normal work of breathing GI: soft, nontender, nondistended, + BS MS: no deformity or atrophy; trace edema; distal pulses are 2+ in all 4 extremities   Skin: warm and dry, no rash Neuro:  Strength and sensation are intact Psych: euthymic mood, full affect   EKG:  EKG is not ordered today.  Echocardiogram 11/30/16: Study Conclusions - Left ventricle: The cavity size was moderately dilated. Wall thickness was increased in a pattern of moderate LVH. Systolic function was severely reduced. The estimated ejection fraction was in the range of 25% to 30%. Diffuse hypokinesis. - Mitral valve: There was mild to moderate regurgitation. - Atrial septum: No defect or patent foramen ovale was identified.   Recent Labs: 11/29/2016: ALT 62; B Natriuretic Peptide 1,295.3 12/02/2016: BUN 42; Creatinine, Ser 2.52; Hemoglobin 7.5; Platelets 221; Potassium 4.0; Sodium 136    Lipid Panel    Component Value Date/Time   CHOL 144 12/01/2016 0616   TRIG 129 12/01/2016 0616   HDL 36 (L) 12/01/2016 0616   CHOLHDL 4.0 12/01/2016 0616   VLDL 26 12/01/2016 0616   LDLCALC 82 12/01/2016 0616     Wt Readings from Last 3 Encounters:  12/10/16 172 lb 6.4 oz (78.2 kg)  12/07/16 177 lb 12.8 oz (80.6 kg)  12/02/16 183 lb 6.4 oz (83.2 kg)     Other studies Reviewed: Additional studies/ records that were reviewed today include: office notes, hospital records and testing.  ASSESSMENT AND PLAN:  1. Acute systolic CHF: She has no history and started getting sick after her sister died. Her volume status is good today. We discussed dry  weight and she needs to keep her weight about where it is now. Continue daily weights and low Na DM diet. If her weight continues to decrease, she may be getting dry, call. Call for weight gain as well. Ck BMET today, f/u in 1 month.  2. LVD, Cardiomyopathy: She had diffuse hypokinesis, not apical wall motion abnormalities or ischemic appearing wall motion abnormalities. She is not a candidate for cardiac catheterization because of her renal function at this time. We discussed stress testing but I told her it would just be a screening test as we could not act on the findings if they were abnormal. She prefers to  defer this.   She is not having any ongoing ischemic symptoms and is on good therapy with aspirin, beta blocker, statin, hydralazine and nitrates. Continue the medical therapy and follow-up in a month. If she continues to do well, recheck an echo in 2-3 months to see how her EF is doing. If it remains low, she will need an ischemic evaluation. If it improves, possibly she had a Takotsubo variant after the death of her sister.  If her EF does not improve, consider Entresto.  3. Hypertension: Her blood pressure is well-controlled today she is on a high dose of carvedilol as well as Lasix, hydralazine and nitrates. No med changes today.   Current medicines are reviewed at length with the patient today.  The patient does not have concerns regarding medicines.  The following changes have been made:  no change  Labs/ tests ordered today include:  No orders of the defined types were placed in this encounter.    Disposition:   FU with Dr. Sallyanne Kuster or myself in a month  Signed, Patricia [name]  12/10/2016 9:53 AM    [place] Phone: [phone]; Fax: [phone]  This note was written with the assistance of speech recognition software. Please excuse any transcriptional errors.

## 2016-12-10 NOTE — Patient Instructions (Signed)
Medication Instructions:  Your physician recommends that you continue on your current medications as directed. Please refer to the Current Medication list given to you today.  Labwork: Your physician recommends that you return for lab work in: TODAY-- BMET  Testing/Procedures: None   Follow-Up: Your physician recommends that you schedule a follow-up appointment in: [place], [place]  Any Other Special Instructions Will Be Listed Below (If Applicable).  WORK NOTE GIVEN  If you need a refill on your cardiac medications before your next appointment, please call your pharmacy.

## 2016-12-10 NOTE — Progress Notes (Signed)
Thanks again [employer]

## 2016-12-11 ENCOUNTER — Other Ambulatory Visit: Payer: Self-pay

## 2016-12-11 ENCOUNTER — Other Ambulatory Visit (HOSPITAL_BASED_OUTPATIENT_CLINIC_OR_DEPARTMENT_OTHER): Payer: Self-pay

## 2016-12-11 DIAGNOSIS — E1121 Type 2 diabetes mellitus with diabetic nephropathy: Secondary | ICD-10-CM

## 2016-12-11 DIAGNOSIS — I5041 Acute combined systolic (congestive) and diastolic (congestive) heart failure: Secondary | ICD-10-CM

## 2016-12-11 LAB — BASIC METABOLIC PANEL
BUN: 49 mg/dL — ABNORMAL HIGH (ref 7–25)
CALCIUM: 8.2 mg/dL — AB (ref 8.6–10.4)
CO2: 26 mmol/L (ref 20–31)
CREATININE: 2.52 mg/dL — AB (ref 0.50–1.05)
Chloride: 106 mmol/L (ref 98–110)
Glucose, Bld: 127 mg/dL — ABNORMAL HIGH (ref 65–99)
Potassium: 4.9 mmol/L (ref 3.5–5.3)
SODIUM: 139 mmol/L (ref 135–146)

## 2016-12-11 NOTE — Progress Notes (Signed)
Patient here for lab visit  

## 2016-12-14 ENCOUNTER — Ambulatory Visit: Payer: Self-pay

## 2016-12-17 ENCOUNTER — Encounter: Payer: Self-pay | Admitting: Physician Assistant

## 2016-12-22 ENCOUNTER — Ambulatory Visit: Payer: Self-pay

## 2016-12-22 ENCOUNTER — Ambulatory Visit: Payer: Self-pay | Attending: Internal Medicine

## 2016-12-22 ENCOUNTER — Telehealth: Payer: Self-pay | Admitting: Physician Assistant

## 2016-12-22 DIAGNOSIS — R0602 Shortness of breath: Secondary | ICD-10-CM

## 2016-12-22 NOTE — Telephone Encounter (Signed)
Spoke with patient and she has been having increased shortness of breath with any exertion, worse since seeing Suanne Marker B PA 12/10/16 She has had episodes of dizziness, one with vomiting associated. She does not check her blood pressure at this, has no way of doing so.  Furosemide just decreased to once daily on 12/15/16 but she is unsure if breathing worse since change Patient denies any chest pain, weight gain, or swelling Concerned with the increasing shortness of breath  Will forward to Dr Sallyanne Kuster for review

## 2016-12-22 NOTE — Telephone Encounter (Signed)
New message      Pt c/o Shortness Of Breath: STAT if SOB developed within the last 24 hours or pt is noticeably SOB on the phone  1. Are you currently SOB (can you hear that pt is SOB on the phone)? no 2. How long have you been experiencing SOB?  Since last ov with rhonda 3. Are you SOB when sitting or when up moving around?  Moving around  4. Are you currently experiencing any other symptoms?  Nausea, lightheaded Pt states she is still unable to work.  Please advise

## 2016-12-23 NOTE — Telephone Encounter (Signed)
Advised patient Will call [facility] in am and get fax number to fax lab orders and will call patient after done At that point will schedule follow up for next week  Patients weight down to 159, ov wt 172 on 12/10/16

## 2016-12-23 NOTE — Telephone Encounter (Signed)
Has she being weighing herself? Report says "no weight gain", but would like to know her exact weight and how it compares to her weight when she saw Suanne Marker last week. Ideally, she should obtain a BP monitor as well. Please schedule for labs: CMET, BNP, Mg ASAP. Needs a follow up appt this week or early next week. If she has syncope/near-syncope or dyspnea at rest, she should go to ED. Thanks [employer]

## 2016-12-24 NOTE — Telephone Encounter (Signed)
Scheduled ov for 3/19 at 2:00 with H Meng  Left message to call back

## 2016-12-24 NOTE — Telephone Encounter (Signed)
Spoke with [facility] and they are Epic  Orders placed, patient aware to call and schedule lab appointment

## 2016-12-25 ENCOUNTER — Ambulatory Visit: Payer: Self-pay | Attending: Internal Medicine

## 2016-12-25 ENCOUNTER — Encounter: Payer: Self-pay | Admitting: *Deleted

## 2016-12-25 DIAGNOSIS — E1121 Type 2 diabetes mellitus with diabetic nephropathy: Secondary | ICD-10-CM | POA: Insufficient documentation

## 2016-12-25 LAB — BASIC METABOLIC PANEL WITH GFR
BUN: 57 mg/dL — ABNORMAL HIGH (ref 7–25)
CALCIUM: 8.1 mg/dL — AB (ref 8.6–10.4)
CO2: 24 mmol/L (ref 20–31)
Chloride: 106 mmol/L (ref 98–110)
Creat: 2.22 mg/dL — ABNORMAL HIGH (ref 0.50–1.05)
GFR, EST AFRICAN AMERICAN: 27 mL/min — AB (ref >=60)
GFR, Est Non African American: 24 mL/min — ABNORMAL LOW (ref >=60)
Glucose, Bld: 143 mg/dL — ABNORMAL HIGH (ref 65–99)
POTASSIUM: 4.9 mmol/L (ref 3.5–5.3)
Sodium: 137 mmol/L (ref 135–146)

## 2016-12-25 NOTE — Telephone Encounter (Signed)
Per patients appt desk appointment confirmed with patient today

## 2016-12-28 ENCOUNTER — Ambulatory Visit (INDEPENDENT_AMBULATORY_CARE_PROVIDER_SITE_OTHER): Payer: Self-pay | Admitting: Physician Assistant

## 2016-12-28 ENCOUNTER — Encounter: Payer: Self-pay | Admitting: Physician Assistant

## 2016-12-28 VITALS — BP 142/75 | HR 78 | Ht 67.0 in | Wt 167.4 lb

## 2016-12-28 DIAGNOSIS — E119 Type 2 diabetes mellitus without complications: Secondary | ICD-10-CM

## 2016-12-28 DIAGNOSIS — Z794 Long term (current) use of insulin: Secondary | ICD-10-CM

## 2016-12-28 DIAGNOSIS — I5022 Chronic systolic (congestive) heart failure: Secondary | ICD-10-CM

## 2016-12-28 DIAGNOSIS — R0609 Other forms of dyspnea: Secondary | ICD-10-CM

## 2016-12-28 DIAGNOSIS — N183 Chronic kidney disease, stage 3 unspecified: Secondary | ICD-10-CM

## 2016-12-28 DIAGNOSIS — I10 Essential (primary) hypertension: Secondary | ICD-10-CM

## 2016-12-28 DIAGNOSIS — IMO0001 Reserved for inherently not codable concepts without codable children: Secondary | ICD-10-CM

## 2016-12-28 NOTE — Progress Notes (Signed)
Cardiology Office Note    Date:  12/29/2016   ID:  Patricia [name], Patricia [name] 1955/12/04, MRN [id number]  PCP:  Patricia Reamer, MD  Cardiologist:  Dr. Sallyanne Kuster  Chief Complaint  Patient presents with  . Follow-up    seen for Dr. Sallyanne Kuster  . Shortness of Breath    occasionally.  . Dizziness    occasionally.    History of Present Illness:  Patricia [name] is a 61 y.o. female with PMH of IDDM, HTN, CKD stage III, depression, anxiety and CHF. She was admitted in January 2018 for acute CHF after presenting with 2-3 days history of worsening dyspnea at rest. Her troponin was flat at 0.03. She was having leg edema and orthopnea. This is new for the patient. Echocardiogram obtained on 11/30/2016 showed EF 25-30%, diffuse hypokinesis, mild-to-moderate MR. Outpatient ischemic evaluation was recommended. Her weight at discharge was 183 pounds. She was seen by Rosaria Ferries on follow-up on 11/12/2016, she was doing well at the time but continued to have shortness of breath with exertion. It was also noted that her sister recently died as well. Her stress test was deferred treating the last office visit as she would not be a very good candidate for cardiac catheterization because of her poor renal function. The plan is to uptitrate her heart failure medication and recheck echocardiogram in 2-3 month to see if her EF improve. There is a possibility that she has Takotsubo variant after the death of her sister.  She presents today for cardiology office evaluation. She says since her last visit, she has been having more shortness of breath during exertion. It was also noted that she has been attempting more exertional type activity recently as well. She denies any obvious chest pain. She denies any shortness of breath at rest, she does not have any lower extremity edema orthopnea or paroxysmal nocturnal dyspnea. She has no heart failure symptom on physical exam. I think her dyspnea with exertion is likely related to her  low ejection fraction. Otherwise at this point I do not know if she does or does not have significant coronary artery disease. Given lack of chest pain, I recommended continue on the current plan with heart failure medication and potentially repeat echocardiogram in 3 month, if her EF is still low, then we really need to make a decision she would want cardiac catheterization at potential risk of significant contrast nephropathy. During the meantime, I asked her to continue activity as tolerated. She has upcoming visit to set up with an nephrologist.   Past Medical History:  Diagnosis Date  . Diabetes mellitus without complication ([place])   . Hypertension   . Obesity     Past Surgical History:  Procedure Laterality Date  . ABDOMINAL HYSTERECTOMY    . BREAST SURGERY     reduction  . CESAREAN SECTION  1982, 1988    Current Medications: Outpatient Medications Prior to Visit  Medication Sig Dispense Refill  . alum & mag hydroxide-simeth (MAALOX/MYLANTA) 200-200-20 MG/5ML suspension Take 15 mLs by mouth every 6 (six) hours as needed for indigestion or heartburn.    Marland Kitchen aspirin EC 81 MG tablet Take 1 tablet (81 mg total) by mouth daily. 90 tablet 3  . atorvastatin (LIPITOR) 20 MG tablet Take 0.5 tablets (10 mg total) by mouth daily. 90 tablet 3  . carvedilol (COREG) 25 MG tablet Take 1 tablet (25 mg total) by mouth 2 (two) times daily with a meal. 60 tablet 1  .  escitalopram (LEXAPRO) 20 MG tablet Take 1 tablet (20 mg total) by mouth at bedtime. 30 tablet 2  . ferrous sulfate 325 (65 FE) MG tablet Take 1 tablet (325 mg total) by mouth 2 (two) times daily with a meal. 60 tablet 1  . furosemide (LASIX) 40 MG tablet Take 40 mg by mouth daily.    Marland Kitchen gabapentin (NEURONTIN) 100 MG capsule Take 1 capsule (100 mg total) by mouth at bedtime. 90 capsule 3  . glipiZIDE (GLUCOTROL) 10 MG tablet TAKE 1 TABLET BY MOUTH 2 TIMES DAILY BEFORE A MEAL. 180 tablet 3  . glucose blood (TRUE METRIX BLOOD GLUCOSE TEST)  test strip Use as instructed 100 each 12  . hydrALAZINE (APRESOLINE) 25 MG tablet Take 1 tablet (25 mg total) by mouth every 8 (eight) hours. 90 tablet 1  . insulin aspart (NOVOLOG) 100 UNIT/ML injection 10 units before meals; Unless eating small meal, Inject 8 units prior.  Do not inject if not eating. 60 mL 3  . Insulin Glargine (LANTUS SOLOSTAR) 100 UNIT/ML Solostar Pen Inject 30 Units into the skin 2 (two) times daily. 90 mL 3  . Insulin Pen Needle 29G X 12.7MM MISC 10 Units by Does not apply route 1 day or 1 dose. 100 each 11  . Insulin Syringe-Needle U-100 30G X 5/16" 1 ML MISC Test 3 times daily 100 each 11  . isosorbide mononitrate (IMDUR) 60 MG 24 hr tablet Take 1 tablet (60 mg total) by mouth daily. 30 tablet 2  . ondansetron (ZOFRAN) 4 MG tablet Take 1 tablet (4 mg total) by mouth every 8 (eight) hours as needed for nausea or vomiting. 12 tablet 0  . TRUEPLUS LANCETS 28G MISC Use as directed 100 each 12   No facility-administered medications prior to visit.      Allergies:   Codone [hydrocodone]; Norvasc [amlodipine besylate]; Hydralazine; and Sulfa antibiotics   Social History   Social History  . Marital status: Divorced    Spouse name: N/A  . Number of children: N/A  . Years of education: N/A   Occupational History  . Caregiver [employer]   Social History Main Topics  . Smoking status: Never Smoker  . Smokeless tobacco: Never Used  . Alcohol use No  . Drug use: No  . Sexual activity: Not Currently    Birth control/ protection: Surgical   Other Topics Concern  . None   Social History Narrative  . None     Family History:  The patient's family history includes Cancer in her brother; Diabetes in her brother.   ROS:   Please see the history of present illness.    ROS All other systems reviewed and are negative.   PHYSICAL EXAM:   VS:  BP (!) 142/75   Pulse 78   Ht 5\' 7"  (1.702 m)   Wt 167 lb 6.4 oz (75.9 kg)   BMI 26.22 kg/m    GEN: Well nourished,  well developed, in no acute distress  HEENT: normal  Neck: no JVD, carotid bruits, or masses Cardiac: RRR; no murmurs, rubs, or gallops,no edema  Respiratory:  clear to auscultation bilaterally, normal work of breathing GI: soft, nontender, nondistended, + BS MS: no deformity or atrophy  Skin: warm and dry, no rash Neuro:  Alert and Oriented x 3, Strength and sensation are intact Psych: euthymic mood, full affect  Wt Readings from Last 3 Encounters:  12/28/16 167 lb 6.4 oz (75.9 kg)  12/10/16 172 lb 6.4 oz (78.2 kg)  12/07/16 177 lb 12.8 oz (80.6 kg)      Studies/Labs Reviewed:   EKG:  EKG is not ordered today.    Recent Labs: 11/29/2016: ALT 62; B Natriuretic Peptide 1,295.3 12/02/2016: Hemoglobin 7.5; Platelets 221 12/25/2016: BUN 57; Creat 2.22; Potassium 4.9; Sodium 137   Lipid Panel    Component Value Date/Time   CHOL 144 12/01/2016 0616   TRIG 129 12/01/2016 0616   HDL 36 (L) 12/01/2016 0616   CHOLHDL 4.0 12/01/2016 0616   VLDL 26 12/01/2016 0616   LDLCALC 82 12/01/2016 0616    Additional studies/ records that were reviewed today include:   Echo 11/30/2016 LV EF: 25% -   30%  ------------------------------------------------------------------- Indications:      Dyspnea 786.09.  ------------------------------------------------------------------- History:   PMH:  Obesity.  Risk factors:  Hypertension. Diabetes mellitus.  ------------------------------------------------------------------- Study Conclusions  - Left ventricle: The cavity size was moderately dilated. Wall   thickness was increased in a pattern of moderate LVH. Systolic   function was severely reduced. The estimated ejection fraction   was in the range of 25% to 30%. Diffuse hypokinesis. - Mitral valve: There was mild to moderate regurgitation. - Atrial septum: No defect or patent foramen ovale was identified.   ASSESSMENT:    1. DOE (dyspnea on exertion)   2. CKD (chronic kidney  disease), stage III   3. IDDM (insulin dependent diabetes mellitus) ([place])   4. Essential hypertension   5. Chronic systolic heart failure (HCC)      PLAN:  In order of problems listed above:  1. Dyspnea on exertion: Although this could be angina equivalent, she does not have any obvious chest pain. Some of this dyspnea on exertion likely is related to her low ejection fraction. Her blood pressure and vital signs stable today. I recommended continue on the current heart failure medication and repeat echocardiogram in 3 month. If her EF remains low at 3 month, then she will need a ischemic workup.  2. Chronic systolic heart failure: Recently diagnosed with EF 25% due to unknown cause. Unclear if ischemic myopathy versus nonischemic. However her poor renal function is somewhat prohibitive of cardiac catheterization due to potential risk of contrast nephropathy.  3. CKD stage III: She is being set up with a nephrologist.  4. Hypertension: Her heart failure medication has been maximized. Continue on carvedilol, hydralazine and Imdur. Lab work obtained 3 days ago showed stable renal function and electrolytes.  5. IDDM: Continue on insulin. Managed by primary care provider.    Medication Adjustments/Labs and Tests Ordered: Current medicines are reviewed at length with the patient today.  Concerns regarding medicines are outlined above.  Medication changes, Labs and Tests ordered today are listed in the Patient Instructions below. Patient Instructions  Medication Instructions:  Your physician recommends that you continue on your current medications as directed. Please refer to the Current Medication list given to you today.   Labwork: NONE ORDERED   Testing/Procedures: NONE ORDERED  Follow-Up: FOLLOW UP WITH Patricia [name], PAC AS PLANNNED  Any Other Special Instructions Will Be Listed Below (If Applicable).  YOU HAVE BEEN GIVEN A WORK NOTE TODAY    If you need a refill on your  cardiac medications before your next appointment, please call your pharmacy.      Hilbert Corrigan, [place]  12/29/2016 1:42 AM    [address], [place], [place]  [address] Phone: [phone]; Fax: [phone]

## 2016-12-28 NOTE — Patient Instructions (Signed)
Medication Instructions:  Your physician recommends that you continue on your current medications as directed. Please refer to the Current Medication list given to you today.   Labwork: NONE ORDERED   Testing/Procedures: NONE ORDERED  Follow-Up: FOLLOW UP WITH RHONDA BARRETT, PAC AS PLANNNED  Any Other Special Instructions Will Be Listed Below (If Applicable).  YOU HAVE BEEN GIVEN A WORK NOTE TODAY    If you need a refill on your cardiac medications before your next appointment, please call your pharmacy.

## 2016-12-29 ENCOUNTER — Encounter: Payer: Self-pay | Admitting: Physician Assistant

## 2016-12-29 MED FILL — ?FUROSEMIDE 40 MG TABLET: 40 | 30 days supply | Qty: 60 | Fill #1

## 2016-12-29 MED FILL — ?CARVEDILOL 25 MG TABLET: 25 | 30 days supply | Qty: 60 | Fill #1

## 2016-12-29 MED FILL — FERROUS SULFATE 325 MG TAB: 325 (65 FE) | 30 days supply | Qty: 60 | Fill #1

## 2016-12-29 MED FILL — ?GABAPENTIN 100 MG CAP: 30 days supply | Qty: 30 | Fill #1

## 2016-12-30 ENCOUNTER — Telehealth: Payer: Self-pay | Admitting: Internal Medicine

## 2016-12-30 MED FILL — $LANTUS SOLOSTAR 100 UNITS/: 100 | 28 days supply | Qty: 18 | Fill #1

## 2016-12-30 NOTE — Telephone Encounter (Signed)
Got up to make her bed and take shower but felt faint. Her daughter thought it was low blood . She gave her peanut butter crackers, checked CBG-189 BP prior to calling during episode was: 96/70 and 110/55 She felt nauseated and possibly passed out for  A moment because she thought she was speaking but was not.  C/o back pain located in neck and lower back. VS: while on the phone BP:  121/60, P; 70  Advised to decrease pace of activity this morning. If symptoms return before return call to proceed to ED. Will route message to PCP.

## 2016-12-30 NOTE — Telephone Encounter (Signed)
Spoke to patient daughter Pt aware, states she is feeling better.

## 2016-12-30 NOTE — Telephone Encounter (Signed)
Thanks for update. If recurs /syncope/presyncope or any medical concern - needs to have immediate eval by doctor. Her ef is 25-30%. thanks

## 2016-12-30 NOTE — Telephone Encounter (Signed)
Left message on voicemail.

## 2016-12-30 NOTE — Telephone Encounter (Signed)
Patient's daughter contacted the office to inform PCP that mother's bp is low. Needs advice on what to do. Please follow up.  Thank you.

## 2017-01-04 ENCOUNTER — Encounter: Payer: Self-pay | Admitting: Internal Medicine

## 2017-01-04 ENCOUNTER — Ambulatory Visit: Payer: Self-pay | Attending: Internal Medicine | Admitting: Internal Medicine

## 2017-01-04 VITALS — BP 128/65 | HR 76 | Temp 98.0°F | Resp 16 | Wt 166.2 lb

## 2017-01-04 DIAGNOSIS — Z794 Long term (current) use of insulin: Secondary | ICD-10-CM | POA: Insufficient documentation

## 2017-01-04 DIAGNOSIS — F418 Other specified anxiety disorders: Secondary | ICD-10-CM

## 2017-01-04 DIAGNOSIS — I10 Essential (primary) hypertension: Secondary | ICD-10-CM

## 2017-01-04 DIAGNOSIS — E1121 Type 2 diabetes mellitus with diabetic nephropathy: Secondary | ICD-10-CM | POA: Insufficient documentation

## 2017-01-04 DIAGNOSIS — I130 Hypertensive heart and chronic kidney disease with heart failure and stage 1 through stage 4 chronic kidney disease, or unspecified chronic kidney disease: Secondary | ICD-10-CM | POA: Insufficient documentation

## 2017-01-04 DIAGNOSIS — E785 Hyperlipidemia, unspecified: Secondary | ICD-10-CM | POA: Insufficient documentation

## 2017-01-04 DIAGNOSIS — D509 Iron deficiency anemia, unspecified: Secondary | ICD-10-CM | POA: Insufficient documentation

## 2017-01-04 DIAGNOSIS — R42 Dizziness and giddiness: Secondary | ICD-10-CM | POA: Insufficient documentation

## 2017-01-04 DIAGNOSIS — F419 Anxiety disorder, unspecified: Secondary | ICD-10-CM | POA: Insufficient documentation

## 2017-01-04 DIAGNOSIS — E1122 Type 2 diabetes mellitus with diabetic chronic kidney disease: Secondary | ICD-10-CM | POA: Insufficient documentation

## 2017-01-04 DIAGNOSIS — N184 Chronic kidney disease, stage 4 (severe): Secondary | ICD-10-CM | POA: Insufficient documentation

## 2017-01-04 DIAGNOSIS — Z7982 Long term (current) use of aspirin: Secondary | ICD-10-CM | POA: Insufficient documentation

## 2017-01-04 DIAGNOSIS — E669 Obesity, unspecified: Secondary | ICD-10-CM | POA: Insufficient documentation

## 2017-01-04 DIAGNOSIS — I5041 Acute combined systolic (congestive) and diastolic (congestive) heart failure: Secondary | ICD-10-CM

## 2017-01-04 DIAGNOSIS — D631 Anemia in chronic kidney disease: Secondary | ICD-10-CM | POA: Insufficient documentation

## 2017-01-04 DIAGNOSIS — I5043 Acute on chronic combined systolic (congestive) and diastolic (congestive) heart failure: Secondary | ICD-10-CM | POA: Insufficient documentation

## 2017-01-04 DIAGNOSIS — Z09 Encounter for follow-up examination after completed treatment for conditions other than malignant neoplasm: Secondary | ICD-10-CM | POA: Insufficient documentation

## 2017-01-04 DIAGNOSIS — E1165 Type 2 diabetes mellitus with hyperglycemia: Secondary | ICD-10-CM

## 2017-01-04 DIAGNOSIS — F329 Major depressive disorder, single episode, unspecified: Secondary | ICD-10-CM | POA: Insufficient documentation

## 2017-01-04 LAB — GLUCOSE, POCT (MANUAL RESULT ENTRY): POC Glucose: 229 mg/dl — AB (ref 70–99)

## 2017-01-04 MED ORDER — ASPIRIN EC 81 MG PO TBEC: 81.0000 mg | DELAYED_RELEASE_TABLET | Freq: Every day | ORAL | 3 refills | Status: AC

## 2017-01-04 MED ORDER — TRUEPLUS LANCETS 28G MISC: 12 refills | Status: DC

## 2017-01-04 MED ORDER — GLUCOSE BLOOD VI STRP: ORAL_STRIP | 12 refills | Status: DC

## 2017-01-04 MED ORDER — ESCITALOPRAM OXALATE 20 MG PO TABS
20.0000 mg | ORAL_TABLET | Freq: Every day | ORAL | 2 refills | Status: DC
Start: 2017-01-04 — End: 2017-05-21

## 2017-01-04 MED ORDER — INSULIN PEN NEEDLE 29G X 12.7MM MISC: 10.0000 [IU] | 11 refills | Status: DC

## 2017-01-04 MED ORDER — FERROUS SULFATE 325 (65 FE) MG PO TABS: 325.0000 mg | ORAL_TABLET | Freq: Two times a day (BID) | ORAL | 1 refills | Status: DC

## 2017-01-04 MED ORDER — GLIPIZIDE 10 MG PO TABS: ORAL_TABLET | ORAL | 3 refills | Status: DC

## 2017-01-04 MED ORDER — INSULIN SYRINGE-NEEDLE U-100 30G X 5/16" 1 ML MISC
11 refills | Status: DC
Start: 2017-01-04 — End: 2018-03-09

## 2017-01-04 MED ORDER — SENNA 8.6 MG PO TABS: 1.0000 | ORAL_TABLET | Freq: Every day | ORAL | 0 refills | Status: DC | PRN

## 2017-01-04 MED ORDER — ATORVASTATIN CALCIUM 20 MG PO TABS: 10.0000 mg | ORAL_TABLET | Freq: Every day | ORAL | 3 refills | Status: DC

## 2017-01-04 MED ORDER — INSULIN GLARGINE 100 UNIT/ML SOLOSTAR PEN: 30.0000 [IU] | PEN_INJECTOR | Freq: Two times a day (BID) | SUBCUTANEOUS | 3 refills | Status: DC

## 2017-01-04 MED FILL — ATORVASTATIN 20 MG TABLET: 20 | 30 days supply | Qty: 15 | Fill #0

## 2017-01-04 MED FILL — TRUEPLUS SYR 0.5ML 30GX5/16: 30G X 5/16" | 30 days supply | Qty: 100 | Fill #0

## 2017-01-04 MED FILL — BD ULTRA FINE PEN NEEDLES: 29G X 12.7M | 30 days supply | Qty: 100 | Fill #0

## 2017-01-04 MED FILL — ?ESCITALOPRAM 20 MG TABLET: 20 | 30 days supply | Qty: 30 | Fill #0

## 2017-01-04 MED FILL — glipiZIDE 10 MG TABS: 10 | 30 days supply | Qty: 60 | Fill #0

## 2017-01-04 MED FILL — TRUEplus LANCETS 28G MISC: 30 days supply | Qty: 100 | Fill #0

## 2017-01-04 MED FILL — TRUE METRIX TEST STRIP: 30 days supply | Qty: 100 | Fill #0

## 2017-01-04 NOTE — Progress Notes (Signed)
Patricia [name], is a 61 y.o. female  BZJ:[id number]  OFB:[id number]  DOB - 08/12/1956  Chief Complaint  Patient presents with  . Follow-up        Subjective:   Patricia [name] is a 61 y.o. female here today for a follow up visit.  Significant pmxh includes htn, dm2 insulin dependent, ckd 4 now, recent acute on chronic combined chf as well. Ef 25-30% on 11/30/16.  +depression stable, and normocytic anemia of ckd and IDA.  She has been taking iron bid, but getting constipated some.  Pt has loss about 25lbs (form 188lbs) when I last saw her 11/23/16 to 166lbs today.  She overall feels well, denies any lower extrem swelling now, but does get dizzy at times when gets up to fast. Not currently wearing her ted hose.   Patient has No headache, No chest pain, No abdominal pain - No Nausea, No new weakness tingling or numbness, No Cough - SOB.  She is here w/ her dgt.  No problems updated.  ALLERGIES: Allergies  Allergen Reactions  . Codone [Hydrocodone] Itching  . Norvasc [Amlodipine Besylate] Swelling    Lower extremity?  . Hydralazine Other (See Comments)    Leg swelling  . Sulfa Antibiotics Itching and Rash    PAST MEDICAL HISTORY: Past Medical History:  Diagnosis Date  . Diabetes mellitus without complication ([place])   . Hypertension   . Obesity     MEDICATIONS AT HOME: Prior to Admission medications   Medication Sig Start Date End Date Taking? Authorizing Provider  alum & mag hydroxide-simeth (MAALOX/MYLANTA) 200-200-20 MG/5ML suspension Take 15 mLs by mouth every 6 (six) hours as needed for indigestion or heartburn.    Historical Provider, MD  aspirin EC 81 MG tablet Take 1 tablet (81 mg total) by mouth daily. 01/04/17   Maren Reamer, MD  atorvastatin (LIPITOR) 20 MG tablet Take 0.5 tablets (10 mg total) by mouth daily. 01/04/17   Maren Reamer, MD  carvedilol (COREG) 25 MG tablet Take 1 tablet (25 mg total) by mouth 2 (two) times daily with a meal. 12/02/16   Costin Karlyne Greenspan, MD  escitalopram (LEXAPRO) 20 MG tablet Take 1 tablet (20 mg total) by mouth at bedtime. 01/04/17   Maren Reamer, MD  ferrous sulfate 325 (65 FE) MG tablet Take 1 tablet (325 mg total) by mouth 2 (two) times daily with a meal. 01/04/17   Maren Reamer, MD  furosemide (LASIX) 40 MG tablet Take 40 mg by mouth daily.    Historical Provider, MD  gabapentin (NEURONTIN) 100 MG capsule Take 1 capsule (100 mg total) by mouth at bedtime. 09/29/16   Maren Reamer, MD  glipiZIDE (GLUCOTROL) 10 MG tablet TAKE 1 TABLET BY MOUTH 2 TIMES DAILY BEFORE A MEAL. 01/04/17   Maren Reamer, MD  glucose blood (TRUE METRIX BLOOD GLUCOSE TEST) test strip Use as instructed 01/04/17   Maren Reamer, MD  hydrALAZINE (APRESOLINE) 25 MG tablet Take 1 tablet (25 mg total) by mouth every 8 (eight) hours. 12/02/16   [place], MD  insulin aspart (NOVOLOG) 100 UNIT/ML injection 10 units before meals; Unless eating small meal, Inject 8 units prior.  Do not inject if not eating. 09/29/16   Maren Reamer, MD  Insulin Glargine (LANTUS SOLOSTAR) 100 UNIT/ML Solostar Pen Inject 30 Units into the skin 2 (two) times daily. 01/04/17   Maren Reamer, MD  Insulin Pen Needle 29G X 12.7MM MISC 10 Units by  Does not apply route 1 day or 1 dose. 01/04/17   Maren Reamer, MD  Insulin Syringe-Needle U-100 30G X 5/16" 1 ML MISC Test 3 times daily 01/04/17   Maren Reamer, MD  isosorbide mononitrate (IMDUR) 60 MG 24 hr tablet Take 1 tablet (60 mg total) by mouth daily. 11/09/16   Maren Reamer, MD  ondansetron (ZOFRAN) 4 MG tablet Take 1 tablet (4 mg total) by mouth every 8 (eight) hours as needed for nausea or vomiting. 11/01/16   Duffy Bruce, MD  senna (SENOKOT) 8.6 MG TABS tablet Take 1 tablet (8.6 mg total) by mouth daily as needed for mild constipation. 01/04/17   Maren Reamer, MD  TRUEPLUS LANCETS 28G MISC Use as directed 01/04/17   Maren Reamer, MD     Objective:   Vitals:   01/04/17 1131  BP:  128/65  Pulse: 76  Resp: 16  Temp: 98 F (36.7 C)  TempSrc: Oral  SpO2: 98%  Weight: 166 lb 3.2 oz (75.4 kg)    Exam General appearance : Awake, alert, not in any distress. Speech Clear. Not toxic looking HEENT: Atraumatic and Normocephalic, pupils equally reactive to light. Neck: supple, no JVD. No cervical lymphadenopathy.  Chest:Good air entry bilaterally, no added sounds. CVS: S1 S2 regular, no murmurs/gallups or rubs. Abdomen: Bowel sounds active, Non tender and not distended with no gaurding, rigidity or rebound. Extremities: B/L Lower Ext shows no edema, both legs are warm to touch Neurology: Awake alert, and oriented X 3, CN II-XII grossly intact, Non focal Skin:No Rash  Data Review Lab Results  Component Value Date   HGBA1C 8.4 (H) 12/01/2016   HGBA1C 10.1 10/21/2016   HGBA1C 13.7 08/10/2016    Depression screen PHQ 2/9 12/07/2016 11/23/2016 11/16/2016 10/21/2016 09/29/2016  Decreased Interest 1 1 2 2 2   Down, Depressed, Hopeless 1 1 2 2 2   PHQ - 2 Score 2 2 4 4 4   Altered sleeping 1 1 0 0 3  Tired, decreased energy 3 2 2 1 2   Change in appetite 0 1 1 1 2   Feeling bad or failure about yourself  1 1 1 1 3   Trouble concentrating 1 2 1  0 2  Moving slowly or fidgety/restless 0 0 1 0 1  Suicidal thoughts 0 0 0 0 0  PHQ-9 Score 8 9 10 7 17   Difficult doing work/chores - - - - -  Some recent data might be hidden      Assessment & Plan   1. Diabetic nephropathy associated with type 2 diabetes mellitus ([place]) - encouraged low carb diet, she had oatmeal and apples this am, caution w/ fruit discussed, more protein/healthy fats/vegs encouraged. - POCT glucose (manual entry) - continue glipizide 10bid - continue lantus 30bid, only using novolog based on ssi. - supplies renewed. - glipiZIDE (GLUCOTROL) 10 MG tablet; TAKE 1 TABLET BY MOUTH 2 TIMES DAILY BEFORE A MEAL.  Dispense: 180 tablet; Refill: 3  2. acute systolic and diastolic congestive heart failure ([place]) Seeing  cards, has f/u visit in 64months, will need repeat echo than. - continue lasix 40qd, coreg 25bid, hydralazine 25 q8hrs, imdur 60 qd - avoid  acei for now due to aki/ckd.  3. CKD stage 4 due to type 2 diabetes mellitus ([place]) - has orange card now. - Ambulatory referral to Nephrology  4. Iron deficiency anemia, unspecified iron deficiency anemia type - continue iron bid for now, - CBC with Differential - Vitamin B12 - Folate -  high fiber diet encouraged for constipation on iron, ground flax seed good fiber source, prn senna rx.  5. htn - well controlled on current regimen.  6. Dyslipidemia - atorvastatin (LIPITOR) 20 MG tablet; Take 0.5 tablets (10 mg total) by mouth daily.  Dispense: 90 tablet; Refill: 3 - continue asa 81 qd  7. Anxiety and depression Doing much better overall, denies si/hi/avh - escitalopram (LEXAPRO) 20 MG tablet; Take 1 tablet (20 mg total) by mouth at bedtime.  Dispense: 30 tablet; Refill: 2  8. Dizziness, no syncope/loc./cp - recd wearing ted hose compression stockings during day - recd shaking legs when sitting prior to getting up for better blood circulation - neg orthostatics today - keep same bp rx.   Patient have been counseled extensively about nutrition and exercise  Return in about 3 months (around 04/06/2017), or if symptoms worsen or fail to improve.  The patient was given clear instructions to go to ER or return to medical center if symptoms don't improve, worsen or new problems develop. The patient verbalized understanding. The patient was told to call to get lab results if they haven't heard anything in the next week.   This note has been created with Surveyor, quantity. Any transcriptional errors are unintentional.   Maren Reamer, MD, [place] and [facility] [place], [place]   01/04/2017, 12:19 PM

## 2017-01-04 NOTE — Patient Instructions (Addendum)
Ground flax seed - good fiber source  -Diabetes Mellitus and Food It is important for you to manage your blood sugar (glucose) level. Your blood glucose level can be greatly affected by what you eat. Eating healthier foods in the appropriate amounts throughout the day at about the same time each day will help you control your blood glucose level. It can also help slow or prevent worsening of your diabetes mellitus. Healthy eating may even help you improve the level of your blood pressure and reach or maintain a healthy weight. General recommendations for healthful eating and cooking habits include:  Eating meals and snacks regularly. Avoid going long periods of time without eating to lose weight.  Eating a diet that consists mainly of plant-based foods, such as fruits, vegetables, nuts, legumes, and whole grains.  Using low-heat cooking methods, such as baking, instead of high-heat cooking methods, such as deep frying. Work with your dietitian to make sure you understand how to use the Nutrition Facts information on food labels. How can food affect me? Carbohydrates  Carbohydrates affect your blood glucose level more than any other type of food. Your dietitian will help you determine how many carbohydrates to eat at each meal and teach you how to count carbohydrates. Counting carbohydrates is important to keep your blood glucose at a healthy level, especially if you are using insulin or taking certain medicines for diabetes mellitus. Alcohol  Alcohol can cause sudden decreases in blood glucose (hypoglycemia), especially if you use insulin or take certain medicines for diabetes mellitus. Hypoglycemia can be a life-threatening condition. Symptoms of hypoglycemia (sleepiness, dizziness, and disorientation) are similar to symptoms of having too much alcohol. If your health care provider has given you approval to drink alcohol, do so in moderation and use the following guidelines:  Women should not  have more than one drink per day, and men should not have more than two drinks per day. One drink is equal to:  12 oz of beer.  5 oz of wine.  1 oz of hard liquor.  Do not drink on an empty stomach.  Keep yourself hydrated. Have water, diet soda, or unsweetened iced tea.  Regular soda, juice, and other mixers might contain a lot of carbohydrates and should be counted. What foods are not recommended? As you make food choices, it is important to remember that all foods are not the same. Some foods have fewer nutrients per serving than other foods, even though they might have the same number of calories or carbohydrates. It is difficult to get your body what it needs when you eat foods with fewer nutrients. Examples of foods that you should avoid that are high in calories and carbohydrates but low in nutrients include:  Trans fats (most processed foods list trans fats on the Nutrition Facts label).  Regular soda.  Juice.  Candy.  Sweets, such as cake, pie, doughnuts, and cookies.  Fried foods. What foods can I eat? Eat nutrient-rich foods, which will nourish your body and keep you healthy. The food you should eat also will depend on several factors, including:  The calories you need.  The medicines you take.  Your weight.  Your blood glucose level.  Your blood pressure level.  Your cholesterol level. You should eat a variety of foods, including:  Protein.  Lean cuts of meat.  Proteins low in saturated fats, such as fish, egg whites, and beans. Avoid processed meats.  Fruits and vegetables.  Fruits and vegetables that may help control  blood glucose levels, such as apples, mangoes, and yams.  Dairy products.  Choose fat-free or low-fat dairy products, such as milk, yogurt, and cheese.  Grains, bread, pasta, and rice.  Choose whole grain products, such as multigrain bread, whole oats, and brown rice. These foods may help control blood pressure.  Fats.  Foods  containing healthful fats, such as nuts, avocado, olive oil, canola oil, and fish. Does everyone with diabetes mellitus have the same meal plan? Because every person with diabetes mellitus is different, there is not one meal plan that works for everyone. It is very important that you meet with a dietitian who will help you create a meal plan that is just right for you. This information is not intended to replace advice given to you by your health care provider. Make sure you discuss any questions you have with your health care provider. Document Released: 06/25/2005 Document Revised: 03/05/2016 Document Reviewed: 08/25/2013 Elsevier Interactive Patient Education  2017 Elsevier Inc.   -   High-Fiber Diet Fiber, also called dietary fiber, is a type of carbohydrate found in fruits, vegetables, whole grains, and beans. A high-fiber diet can have many health benefits. Your health care provider may recommend a high-fiber diet to help:  Prevent constipation. Fiber can make your bowel movements more regular.  Lower your cholesterol.  Relieve hemorrhoids, uncomplicated diverticulosis, or irritable bowel syndrome.  Prevent overeating as part of a weight-loss plan.  Prevent heart disease, type 2 diabetes, and certain cancers. What is my plan? The recommended daily intake of fiber includes:  38 grams for men under age 12.  71 grams for men over age 26.  51 grams for women under age 18.  63 grams for women over age 16. You can get the recommended daily intake of dietary fiber by eating a variety of fruits, vegetables, grains, and beans. Your health care provider may also recommend a fiber supplement if it is not possible to get enough fiber through your diet. What do I need to know about a high-fiber diet?  Fiber supplements have not been widely studied for their effectiveness, so it is better to get fiber through food sources.  Always check the fiber content on thenutrition facts label of  any prepackaged food. Look for foods that contain at least 5 grams of fiber per serving.  Ask your dietitian if you have questions about specific foods that are related to your condition, especially if those foods are not listed in the following section.  Increase your daily fiber consumption gradually. Increasing your intake of dietary fiber too quickly may cause bloating, cramping, or gas.  Drink plenty of water. Water helps you to digest fiber. What foods can I eat? Grains  Whole-grain breads. Multigrain cereal. Oats and oatmeal. Brown rice. Barley. Bulgur wheat. Veteran. Bran muffins. Popcorn. Rye wafer crackers. Vegetables  Sweet potatoes. Spinach. Kale. Artichokes. Cabbage. Broccoli. Green peas. Carrots. Squash. Fruits  Berries. Pears. Apples. Oranges. Avocados. Prunes and raisins. Dried figs. Meats and Other Protein Sources  Navy, kidney, pinto, and soy beans. Split peas. Lentils. Nuts and seeds. Dairy  Fiber-fortified yogurt. Beverages  Fiber-fortified soy milk. Fiber-fortified orange juice. Other  Fiber bars. The items listed above may not be a complete list of recommended foods or beverages. Contact your dietitian for more options.  What foods are not recommended? Grains  White bread. Pasta made with refined flour. White rice. Vegetables  Fried potatoes. Canned vegetables. Well-cooked vegetables. Fruits  Fruit juice. Cooked, strained fruit. Meats and Other  Protein Sources  Fatty cuts of meat. Fried Sales executive or fried fish. Dairy  Milk. Yogurt. Cream cheese. Sour cream. Beverages  Soft drinks. Other  Cakes and pastries. Butter and oils. The items listed above may not be a complete list of foods and beverages to avoid. Contact your dietitian for more information.  What are some tips for including high-fiber foods in my diet?  Eat a wide variety of high-fiber foods.  Make sure that half of all grains consumed each day are whole grains.  Replace breads and cereals made  from refined flour or white flour with whole-grain breads and cereals.  Replace white rice with brown rice, bulgur wheat, or millet.  Start the day with a breakfast that is high in fiber, such as a cereal that contains at least 5 grams of fiber per serving.  Use beans in place of meat in soups, salads, or pasta.  Eat high-fiber snacks, such as berries, raw vegetables, nuts, or popcorn. This information is not intended to replace advice given to you by your health care provider. Make sure you discuss any questions you have with your health care provider. Document Released: 09/28/2005 Document Revised: 03/05/2016 Document Reviewed: 03/13/2014 Elsevier Interactive Patient Education  2017 [employer].

## 2017-01-05 LAB — CBC WITH DIFFERENTIAL/PLATELET
BASOS ABS: 0 10*3/uL (ref 0.0–0.2)
Basos: 1 %
EOS (ABSOLUTE): 0.1 10*3/uL (ref 0.0–0.4)
EOS: 1 %
Hematocrit: 27.2 % — ABNORMAL LOW (ref 34.0–46.6)
Hemoglobin: 8.6 g/dL — ABNORMAL LOW (ref 11.1–15.9)
IMMATURE GRANS (ABS): 0 10*3/uL (ref 0.0–0.1)
IMMATURE GRANULOCYTES: 0 %
Lymphocytes Absolute: 2.4 10*3/uL (ref 0.7–3.1)
Lymphs: 35 %
MCH: 25.9 pg — ABNORMAL LOW (ref 26.6–33.0)
MCHC: 31.6 g/dL (ref 31.5–35.7)
MCV: 82 fL (ref 79–97)
MONOCYTES: 4 %
Monocytes Absolute: 0.3 10*3/uL (ref 0.1–0.9)
Neutrophils Absolute: 4.1 10*3/uL (ref 1.4–7.0)
Neutrophils: 59 %
PLATELETS: 184 10*3/uL (ref 150–379)
RBC: 3.32 x10E6/uL — AB (ref 3.77–5.28)
RDW: 16.5 % — ABNORMAL HIGH (ref 12.3–15.4)
WBC: 6.9 10*3/uL (ref 3.4–10.8)

## 2017-01-05 LAB — FOLATE: Folate: 10 ng/mL (ref >3.0)

## 2017-01-05 LAB — VITAMIN B12: VITAMIN B 12: 414 pg/mL (ref 232–1245)

## 2017-01-07 ENCOUNTER — Telehealth: Payer: Self-pay

## 2017-01-07 NOTE — Telephone Encounter (Signed)
Contacted pt to go over lab results pt is aware of results and doesn't have any questions or concerns 

## 2017-01-18 ENCOUNTER — Telehealth: Payer: Self-pay | Admitting: Physician Assistant

## 2017-01-18 ENCOUNTER — Ambulatory Visit (INDEPENDENT_AMBULATORY_CARE_PROVIDER_SITE_OTHER): Payer: No Typology Code available for payment source | Admitting: Physician Assistant

## 2017-01-18 ENCOUNTER — Encounter: Payer: Self-pay | Admitting: Physician Assistant

## 2017-01-18 VITALS — BP 122/64 | HR 76 | Ht 65.0 in | Wt 166.4 lb

## 2017-01-18 DIAGNOSIS — I429 Cardiomyopathy, unspecified: Secondary | ICD-10-CM

## 2017-01-18 DIAGNOSIS — R0609 Other forms of dyspnea: Secondary | ICD-10-CM

## 2017-01-18 DIAGNOSIS — I5022 Chronic systolic (congestive) heart failure: Secondary | ICD-10-CM

## 2017-01-18 MED ORDER — HYDRALAZINE HCL 25 MG PO TABS: 12.5000 mg | ORAL_TABLET | Freq: Three times a day (TID) | ORAL | 1 refills | Status: DC

## 2017-01-18 MED FILL — ISOSORBIDE MN ER 60 MG TAB: 60 | 30 days supply | Qty: 30 | Fill #2

## 2017-01-18 NOTE — Telephone Encounter (Signed)
New Message     Missed someones call she just left the office not sure why they would be calling

## 2017-01-18 NOTE — Progress Notes (Signed)
Thanks MCr 

## 2017-01-18 NOTE — Patient Instructions (Addendum)
Medication Instructions:  DECREASE HYDRALAZINE 12.5 MG THREE TIMES DAILY, IF SYMPTOMS DO NOT IMPROVE AFTER 2 WEEKS (02-01-17) OK TO STOP.  IF SYMPTOMS STILL DO NOT IMPROVE AFTER 2 MORE WEEKS, RE-START HYDRALAZINE 12.5MG  THREE TIMES DAILY.  If you need a refill on your cardiac medications before your next appointment, please call your pharmacy.  REFERRAL TO CARDIAC REHABILITATION   Follow-Up: Your physician wants you to follow-up in: [place]   Thank you for choosing [facility] at [facility]!!    RHONDA BARRETT, Revonda Humphrey, [place]

## 2017-01-18 NOTE — Progress Notes (Signed)
Cardiology Office Note   Date:  01/18/2017   ID:  Delenn, Ahn 1956-06-02, MRN [id number]  PCP:  Maren Reamer, MD  Cardiologist:  Dr. Sallyanne Kuster, 12/10/2016 Almyra Deforest, 12/28/2016  Rosaria Ferries, PA-C 11/12/2016  Chief Complaint  Patient presents with  . Follow-up    c/o dizziness    History of Present Illness: Patricia [name] is a 61 y.o. female with a history of IDDM, HTN, CKD stage III, depression, anxiety and S-CHF. EF 25-30% 11/30/2016, no cath due to poor renal function, possible Takotsubo variant after the death of her sister, recheck in 3 months.  At the office visit 12/28/2016, she was still complaining of dyspnea on exertion but her volume status was good, weight 167 pounds, no chest pain.  Patricia [name] presents for cardiology follow up and evaluation.  She is having orthostatic dizziness. She gets this with position changes. It sometimes lasts long enough that she has to sit back down. She had orthostatic VS checked at a recent PCP appt and they were negative.   She knows that she is anemic, and is compliant with iron pills. Her last hemoglobin was 8.6, increased from 7.5. She is also aware that her renal function is poor. She has an appointment with the nephrologist.  She is compliant with her medications. She gets short of breath when she does things, but her legs have not been swelling. Denies orthopnea or PND. Her major complaint is that she just gets very very tired when she does things. Her daughter says that she tries to get her to walk, but it's very difficult. She has some shortness of breath but her major complaint is fatigue, she just feels so tired whenever she tries to get up and walk.  She is not aware of any source of blood loss and has done stool guaiacs in the past through her PCP.  She seems discouraged about her multiple medical problems.   Past Medical History:  Diagnosis Date  . Cardiomyopathy ([place]) 11/2016   no ischemic eval due to CKD    . CKD (chronic kidney disease), stage IV ([place])   . Diabetes mellitus without complication ([place])   . Hypertension   . Obesity     Past Surgical History:  Procedure Laterality Date  . ABDOMINAL HYSTERECTOMY    . BREAST SURGERY     reduction  . [place]    Medication Sig  . alum & mag hydroxide-simeth (MAALOX/MYLANTA) 200-200-20 MG/5ML suspension Take 15 mLs by mouth every 6 (six) hours as needed for indigestion or heartburn.  Marland Kitchen aspirin EC 81 MG tablet Take 1 tablet (81 mg total) by mouth daily.  Marland Kitchen atorvastatin (LIPITOR) 20 MG tablet Take 0.5 tablets (10 mg total) by mouth daily.  . carvedilol (COREG) 25 MG tablet Take 1 tablet (25 mg total) by mouth 2 (two) times daily with a meal.  . escitalopram (LEXAPRO) 20 MG tablet Take 1 tablet (20 mg total) by mouth at bedtime.  . ferrous sulfate 325 (65 FE) MG tablet Take 1 tablet (325 mg total) by mouth 2 (two) times daily with a meal.  . furosemide (LASIX) 40 MG tablet Take 40 mg by mouth daily.  Marland Kitchen gabapentin (NEURONTIN) 100 MG capsule Take 1 capsule (100 mg total) by mouth at bedtime.  Marland Kitchen glipiZIDE (GLUCOTROL) 10 MG tablet TAKE 1 TABLET BY MOUTH 2 TIMES DAILY BEFORE A MEAL.  Marland Kitchen glucose blood (TRUE METRIX BLOOD GLUCOSE TEST) test strip  Use as instructed  . hydrALAZINE (APRESOLINE) 25 MG tablet Take 1 tablet (25 mg total) by mouth every 8 (eight) hours.  . insulin aspart (NOVOLOG) 100 UNIT/ML injection 10 units before meals; Unless eating small meal, Inject 8 units prior.  Do not inject if not eating.  . Insulin Glargine (LANTUS SOLOSTAR) 100 UNIT/ML Solostar Pen Inject 30 Units into the skin 2 (two) times daily.  . Insulin Pen Needle 29G X 12.7MM MISC 10 Units by Does not apply route 1 day or 1 dose.  . Insulin Syringe-Needle U-100 30G X 5/16" 1 ML MISC Test 3 times daily  . isosorbide mononitrate (IMDUR) 60 MG 24 hr tablet Take 1 tablet (60 mg total) by mouth daily.  . ondansetron (ZOFRAN) 4 MG tablet Take 1 tablet (4 mg  total) by mouth every 8 (eight) hours as needed for nausea or vomiting.  . senna (SENOKOT) 8.6 MG TABS tablet Take 1 tablet (8.6 mg total) by mouth daily as needed for mild constipation.  . TRUEPLUS LANCETS 28G MISC Use as directed   No current facility-administered medications for this visit.     Allergies:   Codone [hydrocodone]; Norvasc [amlodipine besylate]; Hydralazine; and Sulfa antibiotics    Social History:  The patient  reports that she has never smoked. She has never used smokeless tobacco. She reports that she does not drink alcohol or use drugs.   Family History:  The patient's family history includes Cancer in her brother; Diabetes in her brother.    ROS:  Please see the history of present illness. All other systems are reviewed and negative.    PHYSICAL EXAM: VS:  BP 122/64 (BP Location: Left Arm)   Pulse 76   Ht 5\' 5"  (1.651 m)   Wt 166 lb 6.4 oz (75.5 kg)   BMI 27.69 kg/m  , BMI Body mass index is 27.69 kg/m. GEN: Well nourished, well developed, female in no acute distress  HEENT: normal for age  Neck: no JVD, no carotid bruit, no masses Cardiac: RRR; soft murmur, no rubs, or gallops Respiratory:  clear to auscultation bilaterally, normal work of breathing GI: soft, nontender, nondistended, + BS MS: no deformity or atrophy; no edema; distal pulses are 2+ in all 4 extremities   Skin: warm and dry, no rash Neuro:  Strength and sensation are intact Psych: euthymic mood, full affect   EKG:  EKG is not ordered today.  Recent Labs: 11/29/2016: ALT 62; B Natriuretic Peptide 1,295.3 12/02/2016: Hemoglobin 7.5 12/25/2016: BUN 57; Creat 2.22; Potassium 4.9; Sodium 137 01/04/2017: Platelets 184    Lipid Panel    Component Value Date/Time   CHOL 144 12/01/2016 0616   TRIG 129 12/01/2016 0616   HDL 36 (L) 12/01/2016 0616   CHOLHDL 4.0 12/01/2016 0616   VLDL 26 12/01/2016 0616   LDLCALC 82 12/01/2016 0616     Wt Readings from Last 3 Encounters:  01/18/17 166  lb 6.4 oz (75.5 kg)  01/04/17 166 lb 3.2 oz (75.4 kg)  12/28/16 167 lb 6.4 oz (75.9 kg)     Other studies Reviewed: Additional studies/ records that were reviewed today include: Office notes, hospital records and testing.  ASSESSMENT AND PLAN:  1.  Chronic systolic CHF: Her weight is stable, her volume status is good. No medication changes for this. She is on a beta blocker, Lasix and hydralazine. Can refer her to cardiac rehabilitation because of her CHF, they may help her increase her activity and feel more comfortable doing this.  2. Orthostatic dizziness and weakness: Orthostatic vital signs 2 weeks ago at her PCP office were negative. Her anemia may account for some or all of the symptoms.  However, in case the hydralazine is causing some orthostatic symptoms, we will cut this in half and if her symptoms improve, she can stop it. I advised that her blood pressure was under good control right now so if changing the hydralazine does not help, please restart it.  3. Cardiomyopathy: She is not on an ACE inhibitor or an ARB because of her kidney function. This is also the reason we have not done any kind of ischemic evaluation as it would not change therapy. She has not had chest pain. I hope that her EF will improve and that this was a Takotsubo variant. She needs to follow-up with Dr. Sallyanne Kuster. She needs an echocardiogram before that next visit.   Current medicines are reviewed at length with the patient today.  The patient does not have concerns regarding medicines.  The following changes have been made:  Taper and discontinue the hydralazine if that helps her dizziness.  Labs/ tests ordered today include:   Orders Placed This Encounter  Procedures  . AMB referral to cardiac rehabilitation   Echocardiogram  Disposition:   FU with Dr. Sallyanne Kuster  Signed, Rosaria Ferries, PA-C  01/18/2017 1:45 PM    [facility] Phone: [phone]; Fax: [phone]    This note was written with the assistance of speech recognition software. Please excuse any transcriptional errors.

## 2017-01-18 NOTE — Telephone Encounter (Signed)
Returned the call to the patient. She stated that the message that was left was letting her know that cardiac rehab would be calling to set up an appointment. She stated that she understood. She had called to confirm.

## 2017-01-29 MED FILL — LISINOPRIL 5 MG TAB: 5 | 30 days supply | Qty: 15 | Fill #0

## 2017-01-30 ENCOUNTER — Encounter (HOSPITAL_COMMUNITY): Payer: Self-pay | Admitting: *Deleted

## 2017-01-30 ENCOUNTER — Emergency Department (HOSPITAL_COMMUNITY)
Admission: EM | Admit: 2017-01-30 | Discharge: 2017-01-30 | Disposition: A | Discharge status: Home / Self Care | Payer: No Typology Code available for payment source | Attending: Emergency Medicine | Admitting: Emergency Medicine

## 2017-01-30 ENCOUNTER — Other Ambulatory Visit: Payer: Self-pay

## 2017-01-30 DIAGNOSIS — E1122 Type 2 diabetes mellitus with diabetic chronic kidney disease: Secondary | ICD-10-CM | POA: Insufficient documentation

## 2017-01-30 DIAGNOSIS — Z794 Long term (current) use of insulin: Secondary | ICD-10-CM | POA: Insufficient documentation

## 2017-01-30 DIAGNOSIS — I130 Hypertensive heart and chronic kidney disease with heart failure and stage 1 through stage 4 chronic kidney disease, or unspecified chronic kidney disease: Secondary | ICD-10-CM | POA: Insufficient documentation

## 2017-01-30 DIAGNOSIS — N184 Chronic kidney disease, stage 4 (severe): Secondary | ICD-10-CM | POA: Insufficient documentation

## 2017-01-30 DIAGNOSIS — R55 Syncope and collapse: Secondary | ICD-10-CM | POA: Insufficient documentation

## 2017-01-30 DIAGNOSIS — Z7982 Long term (current) use of aspirin: Secondary | ICD-10-CM | POA: Insufficient documentation

## 2017-01-30 DIAGNOSIS — R42 Dizziness and giddiness: Secondary | ICD-10-CM

## 2017-01-30 DIAGNOSIS — I509 Heart failure, unspecified: Secondary | ICD-10-CM | POA: Insufficient documentation

## 2017-01-30 LAB — BASIC METABOLIC PANEL
ANION GAP: 9 (ref 5–15)
BUN: 60 mg/dL — ABNORMAL HIGH (ref 6–20)
CHLORIDE: 107 mmol/L (ref 101–111)
CO2: 21 mmol/L — ABNORMAL LOW (ref 22–32)
Calcium: 8.7 mg/dL — ABNORMAL LOW (ref 8.9–10.3)
Creatinine, Ser: 2.75 mg/dL — ABNORMAL HIGH (ref 0.44–1.00)
GFR, EST AFRICAN AMERICAN: 21 mL/min — AB (ref >60)
GFR, EST NON AFRICAN AMERICAN: 18 mL/min — AB (ref >60)
Glucose, Bld: 199 mg/dL — ABNORMAL HIGH (ref 65–99)
POTASSIUM: 5 mmol/L (ref 3.5–5.1)
SODIUM: 137 mmol/L (ref 135–145)

## 2017-01-30 LAB — CBC
HEMATOCRIT: 25.1 % — AB (ref 36.0–46.0)
Hemoglobin: 8.2 g/dL — ABNORMAL LOW (ref 12.0–15.0)
MCH: 26.9 pg (ref 26.0–34.0)
MCHC: 32.7 g/dL (ref 30.0–36.0)
MCV: 82.3 fL (ref 78.0–100.0)
Platelets: 241 10*3/uL (ref 150–400)
RBC: 3.05 MIL/uL — AB (ref 3.87–5.11)
RDW: 15.8 % — ABNORMAL HIGH (ref 11.5–15.5)
WBC: 6.9 10*3/uL (ref 4.0–10.5)

## 2017-01-30 LAB — CBG MONITORING, ED: GLUCOSE-CAPILLARY: 169 mg/dL — AB (ref 65–99)

## 2017-01-30 MED ORDER — SODIUM CHLORIDE 0.9 % IV BOLUS (SEPSIS)
500.0000 mL | Freq: Once | INTRAVENOUS | Status: AC
  Administered 2017-01-30: 500 mL via INTRAVENOUS

## 2017-01-30 NOTE — ED Triage Notes (Signed)
Pt reports feeling fine this am, went to the store and became lightheaded and fatigued. Denies cp or sob, no swelling noted to extremities.

## 2017-01-30 NOTE — ED Notes (Signed)
ED Provider at bedside. 

## 2017-01-30 NOTE — ED Notes (Signed)
Pt stated unable to give urine sample at this time. Call light at bedside.

## 2017-01-30 NOTE — ED Notes (Signed)
Pt unable to sign upon discharge due to siganture pad not working. Pt verbalized understanding of discharge instructions and medication changes, nad, vss

## 2017-01-30 NOTE — ED Provider Notes (Signed)
[facility] DEPT Provider Note   CSN: [id number] Arrival date & time: 01/30/17  1559     History   Chief Complaint Chief Complaint  Patient presents with  . Dizziness    HPI Patricia [name] is a 61 y.o. female.  HPI   Today was walking around a store and began to feel dizzy, lightheaded, felt close to passing out. Checked blood sugar 245, before that they were giving her orange juice.  Blood pressure was low 78 systolic pt thinks. Was going to call ambulance but daughter wanted to take her home.  It has happened a few times (3 times over last 1-71mos) was on three pills a day of hydralazine, and heart doctor cut it down to .5 pills of blood pressure medication 3 times per day given dizziness. Kidney doctor just started lisinopril again last night.  Has happened a few times where she has felt lightheaded, nausea. No chest pain.  Shortness of breath sometimes but not today.  Didn't notice palpitations.  Reports happens sometimes when going from sitting to standing and othertimes after walking around for some time/activities.  Did have breakfast this AM, [place] sausage. No increased leg swelling. No hx of DVT/PE.  Last episode of this was 2 weeks ago, saw Cardiologist 4/9. Has had swelling between breasts for 1 week. Taking lasix.  Past Medical History:  Diagnosis Date  . Cardiomyopathy ([place]) 11/2016   no ischemic eval due to CKD  . CKD (chronic kidney disease), stage IV ([place])   . Diabetes mellitus without complication ([place])   . Hypertension   . Obesity     Patient Active Problem List   Diagnosis Date Noted  . Acute CHF (congestive heart failure) ([place]) 11/29/2016  . Diabetes mellitus, type II, insulin dependent ([place]) 11/29/2016  . Normocytic anemia 11/29/2016  . Transaminasemia 11/29/2016  . Acute respiratory failure with hypoxia ([place]) 11/29/2016  . Acute pulmonary edema ([facility])   . Cardiomyopathy ([place]) 11/12/2016  . Lipoma 10/21/2016  . CKD (chronic kidney disease) stage 4, GFR  15-29 ml/min ([facility]) 08/10/2016  . Anxiety and depression 12/12/2015  . Hyperglycemia 02/12/2015  . Diabetic nephropathy associated with type 2 diabetes mellitus ([place]) 11/15/2014  . Flank pain 11/15/2014  . Colon cancer screening 10/01/2014  . Moderate nonproliferative diabetic retinopathy(362.05) 04/17/2014  . 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 Essential hypertension, benign 03/28/2014  . Dental caries 03/28/2014  . Depression 09/14/2013  . Dyslipidemia 04/18/2013  . Diabetes ([place]) 03/10/2013    Past Surgical History:  Procedure Laterality Date  . ABDOMINAL HYSTERECTOMY    . BREAST SURGERY     reduction  . CESAREAN SECTION  1982, 1988    OB History    Gravida Para Term Preterm AB Living   2 2 2     2    SAB TAB Ectopic Multiple Live Births                   Home Medications    Prior to Admission medications   Medication Sig Start Date End Date Taking? Authorizing Provider  alum & mag hydroxide-simeth (MAALOX/MYLANTA) 200-200-20 MG/5ML suspension Take 15 mLs by mouth every 6 (six) hours as needed for indigestion or heartburn.    Historical Provider, MD  aspirin EC 81 MG tablet Take 1 tablet (81 mg total) by mouth daily. 01/04/17   Maren Reamer, MD  atorvastatin (LIPITOR) 20 MG tablet Take 0.5 tablets (10 mg total) by mouth daily. 01/04/17   Maren Reamer, MD  carvedilol (COREG) 25 MG tablet Take 1 tablet (25 mg total) by mouth 2 (two) times daily with a meal. 12/02/16   Costin Karlyne Greenspan, MD  escitalopram (LEXAPRO) 20 MG tablet Take 1 tablet (20 mg total) by mouth at bedtime. 01/04/17   Maren Reamer, MD  ferrous sulfate 325 (65 FE) MG tablet Take 1 tablet (325 mg total) by mouth 2 (two) times daily with a meal. 01/04/17   Maren Reamer, MD  furosemide (LASIX) 40 MG tablet Take 40 mg by mouth daily.    Historical Provider, MD  gabapentin (NEURONTIN) 100 MG capsule Take 1 capsule (100 mg total) by mouth at bedtime. 09/29/16   Maren Reamer, MD  glipiZIDE  (GLUCOTROL) 10 MG tablet TAKE 1 TABLET BY MOUTH 2 TIMES DAILY BEFORE A MEAL. 01/04/17   Maren Reamer, MD  glucose blood (TRUE METRIX BLOOD GLUCOSE TEST) test strip Use as instructed 01/04/17   Maren Reamer, MD  hydrALAZINE (APRESOLINE) 25 MG tablet Take 0.5 tablets (12.5 mg total) by mouth every 8 (eight) hours. 01/18/17   Rhonda G Barrett, PA-C  insulin aspart (NOVOLOG) 100 UNIT/ML injection 10 units before meals; Unless eating small meal, Inject 8 units prior.  Do not inject if not eating. 09/29/16   Maren Reamer, MD  Insulin Glargine (LANTUS SOLOSTAR) 100 UNIT/ML Solostar Pen Inject 30 Units into the skin 2 (two) times daily. 01/04/17   Maren Reamer, MD  Insulin Pen Needle 29G X 12.7MM MISC 10 Units by Does not apply route 1 day or 1 dose. 01/04/17   Maren Reamer, MD  Insulin Syringe-Needle U-100 30G X 5/16" 1 ML MISC Test 3 times daily 01/04/17   Maren Reamer, MD  isosorbide mononitrate (IMDUR) 60 MG 24 hr tablet Take 1 tablet (60 mg total) by mouth daily. 11/09/16   Maren Reamer, MD  ondansetron (ZOFRAN) 4 MG tablet Take 1 tablet (4 mg total) by mouth every 8 (eight) hours as needed for nausea or vomiting. 11/01/16   Duffy Bruce, MD  senna (SENOKOT) 8.6 MG TABS tablet Take 1 tablet (8.6 mg total) by mouth daily as needed for mild constipation. 01/04/17   Maren Reamer, MD  TRUEPLUS LANCETS 28G MISC Use as directed 01/04/17   Maren Reamer, MD    Family History Family History  Problem Relation Age of Onset  . Diabetes Brother   . Cancer Brother     stomach    Social History Social History  Substance Use Topics  . Smoking status: Never Smoker  . Smokeless tobacco: Never Used  . Alcohol use No     Allergies   Codone [hydrocodone]; Norvasc [amlodipine besylate]; Hydralazine; and Sulfa antibiotics   Review of Systems Review of Systems  Constitutional: Negative for fever.  HENT: Negative for sore throat.   Eyes: Negative for visual disturbance.    Respiratory: Negative for cough and shortness of breath.   Cardiovascular: Negative for chest pain, palpitations and leg swelling.  Gastrointestinal: Negative for abdominal pain, nausea and vomiting.  Genitourinary: Negative for difficulty urinating.  Musculoskeletal: Negative for back pain and neck pain.  Skin: Negative for rash.  Neurological: Positive for light-headedness. Negative for syncope and headaches.     Physical Exam Updated Vital Signs BP (!) 163/72   Pulse 77   Temp 98.4 F (36.9 C) (Oral)   Resp 16   SpO2 100%   Physical Exam  Constitutional: She is oriented to person, place, and time.  She appears well-developed and well-nourished. No distress.  HENT:  Head: Normocephalic and atraumatic.  Eyes: Conjunctivae and EOM are normal.  Neck: Normal range of motion.  Cardiovascular: Normal rate, regular rhythm, normal heart sounds and intact distal pulses.  Exam reveals no gallop and no friction rub.   No murmur heard. Pulmonary/Chest: Effort normal and breath sounds normal. No respiratory distress. She has no wheezes. She has no rales.  Area of darkened skin, tenderness over sternum, no erythema or fluctuance  Abdominal: Soft. She exhibits no distension. There is no tenderness. There is no guarding.  Musculoskeletal: She exhibits no edema or tenderness.  Neurological: She is alert and oriented to person, place, and time.  Skin: Skin is warm and dry. No rash noted. She is not diaphoretic. No erythema.  Nursing note and vitals reviewed.    ED Treatments / Results  Labs (all labs ordered are listed, but only abnormal results are displayed) Labs Reviewed  BASIC METABOLIC PANEL - Abnormal; Notable for the following:       Result Value   CO2 21 (*)    Glucose, Bld 199 (*)    BUN 60 (*)    Creatinine, Ser 2.75 (*)    Calcium 8.7 (*)    GFR calc non Af Amer 18 (*)    GFR calc Af Amer 21 (*)    All other components within normal limits  CBC - Abnormal; Notable for  the following:    RBC 3.05 (*)    Hemoglobin 8.2 (*)    HCT 25.1 (*)    RDW 15.8 (*)    All other components within normal limits  CBG MONITORING, ED - Abnormal; Notable for the following:    Glucose-Capillary 169 (*)    All other components within normal limits  CBG MONITORING, ED    EKG  EKG Interpretation  Date/Time:  Saturday January 30 2017 16:10:44 EDT Ventricular Rate:  65 PR Interval:  154 QRS Duration: 80 QT Interval:  466 QTC Calculation: 484 R Axis:   37 Text Interpretation:  Normal sinus rhythm Abnormal ECG No significant change since last tracing Confirmed by [facility] MD, Junie Panning ([id number]) on 01/30/2017 4:47:34 PM       Radiology No results found.  Procedures Procedures (including critical care time)  Medications Ordered in ED Medications  sodium chloride 0.9 % bolus 500 mL (0 mLs Intravenous Stopped 01/30/17 1900)     Initial Impression / Assessment and Plan / ED Course  I have reviewed the triage vital signs and the nursing notes.  Pertinent labs & imaging results that were available during my care of the patient were reviewed by me and considered in my medical decision making (see chart for details).    61 year old female with a history of diabetes, hypertension, CK D stage III, depression, anxiety and a CHF, with echo in February showing EF of 25-30%, with cardiology desiring repeat at later time given possible Takotsubos, who presents with concern for episode of lightheadedness.  Cardiology has been adjusting her blood pressure medications given concern for episodes of lightheadedness, and has decreased her medication, however yesterday she was placed on lisinopril.   EKG evaluated by me and shows sinus rhythm with no sign of prolonged QTc, no brugada, no sign of HOCM, no acute ST abnormalities.  Cr mildly elevated from prior.  Hgb at baseline. No hx to suggest sepsis/GI bleed.  No chest pain or dyspnea today to suggest ACS or PE. No palpitations.    Patient  with recurring episodes of lightheadedness over the last month which she had initiated discussion with cardiology. At this point they had discussed decreasing her blood pressure medications. Today patient is orthostatic on initial evaluation. Given she appears dry to euvolemic on exam with orthostatic changes in setting of taking Lasix and blood pressure medications, gave 500 mL of normal saline and reevaluated her orthostatic blood pressures. Patient's repeat orthostatic blood pressures are improved. Suspect there may be an element of hypovolemia and blood pressure medication changes contributing to symptoms, however discussed that I recommend close Cardiology follow up again and to discuss holter monitoring given EF.  Recommended if she develops syncope she should return to ED, consider observation in hospital.  At this time will decrease her 12.5mg  hydralazine TID to BID, and recommend continuing lisinopril as initiated yesterday. Patient discharged in stable condition with understanding of reasons to return.    Of note, pt also with tenderness to area of chest wall, no sign of abscess, suspect irritated scar tissue. Recommend continued monitoring.   Final Clinical Impressions(s) / ED Diagnoses   Final diagnoses:  Near syncope  Orthostatic dizziness    New Prescriptions Discharge Medication List as of 01/30/2017  7:20 PM       Gareth Morgan, MD 01/31/17 0110

## 2017-01-30 NOTE — ED Notes (Signed)
ED Provider at bedside with ultrasound machine

## 2017-01-30 NOTE — Discharge Instructions (Signed)
Take your hydralazine 12.5mg  twice daily instead of three times daily. Continue your lisinopril.  Follow up closely with Cardiology, recommend outpatient holter monitoring.

## 2017-02-01 MED FILL — GABAPENTIN 100 MG CAPSULE: 100 | 30 days supply | Qty: 30 | Fill #2

## 2017-02-01 MED FILL — FERROUS SULFATE 325 MG TAB: 325 (65 FE) | 30 days supply | Qty: 60 | Fill #0

## 2017-02-02 ENCOUNTER — Telehealth: Payer: Self-pay | Admitting: Physician Assistant

## 2017-02-02 NOTE — Telephone Encounter (Signed)
New message   *STAT* If patient is at the pharmacy, call can be transferred to refill team.   1. Which medications need to be refilled? (please list name of each medication and dose if known) carvedilol 25 mg,  Ferrous sulfate 325 mg  2. Which pharmacy/location (including street and city if local pharmacy) is medication to be sent to? [facility] [place]  3. Do they need a 30 day or 90 day supply? 90 day

## 2017-02-03 ENCOUNTER — Ambulatory Visit: Payer: Self-pay | Attending: Internal Medicine

## 2017-02-03 MED ORDER — CARVEDILOL 25 MG PO TABS: 25.0000 mg | ORAL_TABLET | Freq: Two times a day (BID) | ORAL | 5 refills | Status: DC

## 2017-02-03 MED FILL — CARVEDILOL 25 MG TABLET: 25 | 30 days supply | Qty: 60 | Fill #0

## 2017-02-03 NOTE — Telephone Encounter (Signed)
Carvedilol filled Fe is to be filled by pcp

## 2017-02-05 ENCOUNTER — Telehealth: Payer: Self-pay | Admitting: Cardiovascular Disease

## 2017-02-05 NOTE — Telephone Encounter (Signed)
°  New Prob   Pt is requesting a small supply of carvedilol (COREG) 25 MG sent to [facility] on [address]. She states she was planning to pick up her prescription from [facility] and [place] but was not aware that they closed at 4 PM today. Pt states she is completely out of her medication.

## 2017-02-06 ENCOUNTER — Other Ambulatory Visit: Payer: Self-pay | Admitting: Physician Assistant

## 2017-02-06 MED ORDER — CARVEDILOL 25 MG PO TABS: 25.0000 mg | ORAL_TABLET | Freq: Two times a day (BID) | ORAL | 11 refills | Status: DC

## 2017-02-06 MED ORDER — CARVEDILOL 25 MG PO TABS: 25.0000 mg | ORAL_TABLET | Freq: Two times a day (BID) | ORAL | 0 refills | Status: DC

## 2017-02-06 NOTE — Progress Notes (Signed)
Sent 30 day Rx to [facility] [address], sent 1 yr Rx to [facility].  Hilbert Corrigan PA Pager: [phone]

## 2017-02-08 NOTE — Telephone Encounter (Signed)
Spoke to patient who informs me this was addressed by PCP office on friday. No further requests or needs at this time.

## 2017-02-10 MED FILL — CARVEDILOL 25 MG TABLET: 25 | 30 days supply | Qty: 60 | Fill #0

## 2017-02-15 ENCOUNTER — Other Ambulatory Visit: Payer: Self-pay | Admitting: Internal Medicine

## 2017-02-15 MED FILL — glipiZIDE 10 MG TABS: 10 | 30 days supply | Qty: 60 | Fill #1

## 2017-02-15 MED FILL — ISOSORBIDE MN ER 60 MG TAB: 60 | 30 days supply | Qty: 30 | Fill #0

## 2017-02-15 MED FILL — ?ESCITALOPRAM 20 MG TABLET: 20 | 30 days supply | Qty: 30 | Fill #1

## 2017-02-25 ENCOUNTER — Encounter: Payer: Self-pay | Admitting: Internal Medicine

## 2017-02-26 ENCOUNTER — Encounter: Payer: Self-pay | Admitting: Internal Medicine

## 2017-03-01 ENCOUNTER — Encounter: Payer: Self-pay | Admitting: Internal Medicine

## 2017-03-01 ENCOUNTER — Other Ambulatory Visit: Payer: Self-pay | Admitting: Internal Medicine

## 2017-03-01 MED FILL — $LANTUS SOLOSTAR 100 UNITS/: 100 | 25 days supply | Qty: 15 | Fill #0

## 2017-03-01 MED FILL — FERROUS SULFATE 325 MG TAB: 325 (65 FE) | 30 days supply | Qty: 60 | Fill #1

## 2017-03-01 MED FILL — ATORVASTATIN 20 MG TABLET: 20 | 30 days supply | Qty: 15 | Fill #1

## 2017-03-01 MED FILL — LISINOPRIL 5 MG TAB: 5 | 30 days supply | Qty: 15 | Fill #1

## 2017-03-12 MED FILL — GABAPENTIN 100 MG CAPSULE: 100 | 30 days supply | Qty: 30 | Fill #3

## 2017-03-12 MED FILL — glipiZIDE 10 MG TABS: 10 | 30 days supply | Qty: 60 | Fill #2

## 2017-03-12 MED FILL — hydrALAZINE HCL 25 MG TABS: 25 | 30 days supply | Qty: 90 | Fill #1

## 2017-03-12 MED FILL — ?ESCITALOPRAM 20 MG TABLET: 20 | 30 days supply | Qty: 30 | Fill #2

## 2017-03-19 ENCOUNTER — Telehealth: Payer: Self-pay | Admitting: Internal Medicine

## 2017-03-19 MED ORDER — FUROSEMIDE 40 MG PO TABS: 40.0000 mg | ORAL_TABLET | Freq: Every day | ORAL | 0 refills | Status: DC

## 2017-03-19 NOTE — Telephone Encounter (Signed)
Furosemide refilled x 30 days to last until visit.

## 2017-03-19 NOTE — Telephone Encounter (Signed)
PT called to request a refill for the furosemide (LASIX) 40 MG tablet   since she is out of them, since she has an appt with her new PCP on 04/06/17, please follow up with PT

## 2017-03-20 ENCOUNTER — Other Ambulatory Visit: Payer: Self-pay | Admitting: Physician Assistant

## 2017-03-20 MED ORDER — FUROSEMIDE 40 MG PO TABS: 40.0000 mg | ORAL_TABLET | Freq: Every day | ORAL | 1 refills | Status: DC

## 2017-03-31 MED FILL — FERROUS SULFATE 325 MG TAB: 325 (65 FE) | 30 days supply | Qty: 60 | Fill #0

## 2017-03-31 MED FILL — LISINOPRIL 5 MG TAB: 5 | 30 days supply | Qty: 15 | Fill #2

## 2017-03-31 MED FILL — ATORVASTATIN 20 MG TABLET: 20 | 30 days supply | Qty: 15 | Fill #2

## 2017-04-02 ENCOUNTER — Other Ambulatory Visit: Payer: Self-pay | Admitting: Physician Assistant

## 2017-04-02 MED ORDER — ISOSORBIDE MONONITRATE ER 60 MG PO TB24: 60.0000 mg | ORAL_TABLET | Freq: Every day | ORAL | 1 refills | Status: DC

## 2017-04-06 ENCOUNTER — Encounter: Payer: Self-pay | Admitting: Gastroenterology

## 2017-04-06 ENCOUNTER — Ambulatory Visit: Payer: Medicaid Other | Attending: Internal Medicine | Admitting: Internal Medicine

## 2017-04-06 ENCOUNTER — Encounter: Payer: Self-pay | Admitting: Internal Medicine

## 2017-04-06 VITALS — BP 157/73 | HR 75 | Temp 97.6°F | Resp 18 | Ht 66.0 in | Wt 170.0 lb

## 2017-04-06 DIAGNOSIS — R42 Dizziness and giddiness: Secondary | ICD-10-CM | POA: Diagnosis not present

## 2017-04-06 DIAGNOSIS — D509 Iron deficiency anemia, unspecified: Secondary | ICD-10-CM | POA: Diagnosis present

## 2017-04-06 DIAGNOSIS — E1142 Type 2 diabetes mellitus with diabetic polyneuropathy: Secondary | ICD-10-CM | POA: Insufficient documentation

## 2017-04-06 DIAGNOSIS — I429 Cardiomyopathy, unspecified: Secondary | ICD-10-CM | POA: Diagnosis not present

## 2017-04-06 DIAGNOSIS — E113319 Type 2 diabetes mellitus with moderate nonproliferative diabetic retinopathy with macular edema, unspecified eye: Secondary | ICD-10-CM | POA: Diagnosis not present

## 2017-04-06 DIAGNOSIS — E118 Type 2 diabetes mellitus with unspecified complications: Secondary | ICD-10-CM | POA: Diagnosis not present

## 2017-04-06 DIAGNOSIS — F419 Anxiety disorder, unspecified: Secondary | ICD-10-CM | POA: Diagnosis not present

## 2017-04-06 DIAGNOSIS — I5022 Chronic systolic (congestive) heart failure: Secondary | ICD-10-CM | POA: Diagnosis not present

## 2017-04-06 DIAGNOSIS — Z9889 Other specified postprocedural states: Secondary | ICD-10-CM | POA: Insufficient documentation

## 2017-04-06 DIAGNOSIS — E1121 Type 2 diabetes mellitus with diabetic nephropathy: Secondary | ICD-10-CM | POA: Diagnosis not present

## 2017-04-06 DIAGNOSIS — Z833 Family history of diabetes mellitus: Secondary | ICD-10-CM | POA: Insufficient documentation

## 2017-04-06 DIAGNOSIS — I10 Essential (primary) hypertension: Secondary | ICD-10-CM | POA: Diagnosis not present

## 2017-04-06 DIAGNOSIS — Z800 Family history of malignant neoplasm of digestive organs: Secondary | ICD-10-CM | POA: Insufficient documentation

## 2017-04-06 DIAGNOSIS — R109 Unspecified abdominal pain: Secondary | ICD-10-CM | POA: Insufficient documentation

## 2017-04-06 DIAGNOSIS — Z888 Allergy status to other drugs, medicaments and biological substances status: Secondary | ICD-10-CM | POA: Diagnosis not present

## 2017-04-06 DIAGNOSIS — I130 Hypertensive heart and chronic kidney disease with heart failure and stage 1 through stage 4 chronic kidney disease, or unspecified chronic kidney disease: Secondary | ICD-10-CM | POA: Insufficient documentation

## 2017-04-06 DIAGNOSIS — Z794 Long term (current) use of insulin: Secondary | ICD-10-CM | POA: Insufficient documentation

## 2017-04-06 DIAGNOSIS — Z90710 Acquired absence of both cervix and uterus: Secondary | ICD-10-CM | POA: Diagnosis not present

## 2017-04-06 DIAGNOSIS — E1122 Type 2 diabetes mellitus with diabetic chronic kidney disease: Secondary | ICD-10-CM | POA: Insufficient documentation

## 2017-04-06 DIAGNOSIS — E1165 Type 2 diabetes mellitus with hyperglycemia: Secondary | ICD-10-CM | POA: Insufficient documentation

## 2017-04-06 DIAGNOSIS — F32A Depression, unspecified: Secondary | ICD-10-CM

## 2017-04-06 DIAGNOSIS — I5020 Unspecified systolic (congestive) heart failure: Secondary | ICD-10-CM | POA: Diagnosis not present

## 2017-04-06 DIAGNOSIS — J9601 Acute respiratory failure with hypoxia: Secondary | ICD-10-CM | POA: Insufficient documentation

## 2017-04-06 DIAGNOSIS — F329 Major depressive disorder, single episode, unspecified: Secondary | ICD-10-CM | POA: Insufficient documentation

## 2017-04-06 DIAGNOSIS — N184 Chronic kidney disease, stage 4 (severe): Secondary | ICD-10-CM

## 2017-04-06 DIAGNOSIS — R740 Nonspecific elevation of levels of transaminase and lactic acid dehydrogenase [LDH]: Secondary | ICD-10-CM | POA: Insufficient documentation

## 2017-04-06 DIAGNOSIS — Z1211 Encounter for screening for malignant neoplasm of colon: Secondary | ICD-10-CM | POA: Diagnosis not present

## 2017-04-06 DIAGNOSIS — Z79899 Other long term (current) drug therapy: Secondary | ICD-10-CM | POA: Insufficient documentation

## 2017-04-06 MED ORDER — INSULIN ASPART 100 UNIT/ML ~~LOC~~ SOLN: SUBCUTANEOUS | 3 refills | Status: DC

## 2017-04-06 NOTE — Progress Notes (Signed)
Patient ID: Patricia [name], female    DOB: May 21, 1956  MRN: [id number]  CC: No chief complaint on file.   Subjective: Patricia [name] is a 61 y.o. female who presents for chronic disease management and to become establish with me as PCP. Her concerns today include:  Patient with history of diabetes type 2 with retinopathy,neuropathy and nephropathy (CKD stage 4), systolic CHF with EF of 81-44% as of 11/2016 possible Takotsubo variant, mixed anemia ACD and IDA, HL and depression.  Since last visit patient was seen by cardiology in April. Her orthostatic dizziness was thought to be due either to the anemia or medications. Hydralazine dose was cut in half. Also seen by nephrologist who started her on low dose of lisinopril. Seen in ER 01/30/2017 for dizziness. Again thought to be due to medication, anemia or combination of both. Hydralazine dose change from 3 times a day to twice a day.  1. DM:  -checking BS 1-2 x a day, usually in a.m. Gives range 80-low 100's Occasional low BS at nights.  Eats a few crackers at bedtime to prevent -Compliant with Lantus and Glipizide.  Blood sugars drop too low when she takes 10 units NovoLog so she uses it very rarely -+ numbness in feet. Slight blurred vision. Eye exam in 3 days. -She has been very diligent about her eating habits. She avoids sweet drinks and sweet snacks  2. CKD stage 4/HTN/CHF/ mixed Anemia -saw Nephrologist yesterday and had blood tests. Told Hb drop to 6. Plan for ?Procrit/Aricept  Injection vs blood transfusion.  More blood test tomorrow then decision will be made per patient -dizziness only if she takes Coreg in a.m with other meds so she now takes her first dose of this medication later in the morning -endorses fatigue.  SOB "not as bad as it use to be." Noticeable when walking up steps. -no blood in stools or Fhx of colon CA. Epic chart has documented that she had colonoscopy in 2015 however there is no report in the chart. Patient does  not recall having a colonoscopy in 2015 -did not take meds as yet for a.m -no LE edema. Limits salt -sleeping on 2 pillows  3.  Dep/anxiety: -doing ok on Lexapro.  "I worry when I get bad news." She was worried after seeing nephrologist yesterday and being told that she may need to have an injection or blood transfusion.   Patient Active Problem List   Diagnosis Date Noted  . Acute CHF (congestive heart failure) ([place]) 11/29/2016  . Diabetes mellitus, type II, insulin dependent ([place]) 11/29/2016  . Normocytic anemia 11/29/2016  . Transaminasemia 11/29/2016  . Acute respiratory failure with hypoxia ([place]) 11/29/2016  . Acute pulmonary edema (HCC)   . Cardiomyopathy ([place]) 11/12/2016  . Lipoma 10/21/2016  . CKD (chronic kidney disease) stage 4, GFR 15-29 ml/min (HCC) 08/10/2016  . Anxiety and depression 12/12/2015  . Hyperglycemia 02/12/2015  . Diabetic nephropathy associated with type 2 diabetes mellitus ([place]) 11/15/2014  . Flank pain 11/15/2014  . Colon cancer screening 10/01/2014  . Moderate nonproliferative diabetic retinopathy(362.05) 04/17/2014  . 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 Essential hypertension, benign 03/28/2014  . Dental caries 03/28/2014  . Depression 09/14/2013  . Dyslipidemia 04/18/2013  . Diabetes ([place]) 03/10/2013     Current Outpatient Prescriptions on File Prior to Visit  Medication Sig Dispense Refill  . alum & mag hydroxide-simeth (MAALOX/MYLANTA) 200-200-20 MG/5ML suspension Take 15 mLs by mouth every 6 (six) hours as needed for indigestion  or heartburn.    Marland Kitchen aspirin EC 81 MG tablet Take 1 tablet (81 mg total) by mouth daily. 90 tablet 3  . atorvastatin (LIPITOR) 20 MG tablet Take 0.5 tablets (10 mg total) by mouth daily. 90 tablet 3  . carvedilol (COREG) 25 MG tablet Take 1 tablet (25 mg total) by mouth 2 (two) times daily with a meal. 60 tablet 11  . escitalopram (LEXAPRO) 20 MG tablet Take 1 tablet (20 mg total) by mouth at bedtime. 30 tablet  2  . ferrous sulfate 325 (65 FE) MG tablet TAKE 1 TABLET BY MOUTH 2 TIMES DAILY WITH A MEAL. 60 tablet 0  . furosemide (LASIX) 40 MG tablet Take 1 tablet (40 mg total) by mouth daily. 90 tablet 1  . gabapentin (NEURONTIN) 100 MG capsule Take 1 capsule (100 mg total) by mouth at bedtime. 90 capsule 3  . glipiZIDE (GLUCOTROL) 10 MG tablet TAKE 1 TABLET BY MOUTH 2 TIMES DAILY BEFORE A MEAL. 180 tablet 3  . glucose blood (TRUE METRIX BLOOD GLUCOSE TEST) test strip Use as instructed 100 each 12  . hydrALAZINE (APRESOLINE) 25 MG tablet Take 0.5 tablets (12.5 mg total) by mouth every 8 (eight) hours. 90 tablet 1  . Insulin Glargine (LANTUS SOLOSTAR) 100 UNIT/ML Solostar Pen Inject 30 Units into the skin 2 (two) times daily. 90 mL 3  . Insulin Pen Needle 29G X 12.7MM MISC 10 Units by Does not apply route 1 day or 1 dose. 100 each 11  . Insulin Syringe-Needle U-100 30G X 5/16" 1 ML MISC Test 3 times daily 100 each 11  . isosorbide mononitrate (IMDUR) 60 MG 24 hr tablet Take 1 tablet (60 mg total) by mouth daily. 90 tablet 1  . ondansetron (ZOFRAN) 4 MG tablet Take 1 tablet (4 mg total) by mouth every 8 (eight) hours as needed for nausea or vomiting. 12 tablet 0  . senna (SENOKOT) 8.6 MG TABS tablet Take 1 tablet (8.6 mg total) by mouth daily as needed for mild constipation. 120 each 0  . TRUEPLUS LANCETS 28G MISC Use as directed 100 each 12  . [DISCONTINUED] quinapril (ACCUPRIL) 5 MG tablet Take by mouth at bedtime.     No current facility-administered medications on file prior to visit.     Allergies  Allergen Reactions  . Codone [Hydrocodone] Itching  . Norvasc [Amlodipine Besylate] Swelling    Lower extremity?  . Hydralazine Other (See Comments)    Leg swelling  . Sulfa Antibiotics Itching and Rash    Social History   Social History  . Marital status: Divorced    Spouse name: N/A  . Number of children: N/A  . Years of education: N/A   Occupational History  . Caregiver [employer]     Social History Main Topics  . Smoking status: Never Smoker  . Smokeless tobacco: Never Used  . Alcohol use No  . Drug use: No  . Sexual activity: Not Currently    Birth control/ protection: Surgical   Other Topics Concern  . Not on file   Social History Narrative  . No narrative on file    Family History  Problem Relation Age of Onset  . Diabetes Brother   . Cancer Brother        stomach    Past Surgical History:  Procedure Laterality Date  . ABDOMINAL HYSTERECTOMY    . BREAST SURGERY     reduction  . CESAREAN SECTION  1982, 1988    ROS: Review  of Systems As stated above  PHYSICAL EXAM: BP (!) 157/73 (BP Location: Left Arm, Patient Position: Sitting, Cuff Size: Normal)   Pulse 75   Temp 97.6 F (36.4 C) (Oral)   Resp 18   Ht 5\' 6"  (1.676 m)   Wt 170 lb (77.1 kg)   SpO2 100%   BMI 27.44 kg/m   Wt Readings from Last 3 Encounters:  04/06/17 170 lb (77.1 kg)  01/18/17 166 lb 6.4 oz (75.5 kg)  01/04/17 166 lb 3.2 oz (75.4 kg)   Physical Exam  General appearance - alert, well appearing, and in no distress Mental status - alert, oriented to person, place, and time, normal mood, behavior, speech, dress, motor activity, and thought processes Neck - no cervical or axillary lymphadenopathy Chest - chest clear bilaterally  Heart -regular rate and rhythm Extremities - no lower extremity edema Diabetic Foot Exam - Simple   Simple Foot Form Visual Inspection No deformities, no ulcerations, no other skin breakdown bilaterally:  Yes Sensation Testing Intact to touch and monofilament testing bilaterally:  Yes Pulse Check Posterior Tibialis and Dorsalis pulse intact bilaterally:  Yes Comments     Depression screen PHQ 2/9 12/07/2016  Decreased Interest 1  Down, Depressed, Hopeless 1  PHQ - 2 Score 2  Altered sleeping 1  Tired, decreased energy 3  Change in appetite 0  Feeling bad or failure about yourself  1  Trouble concentrating 1  Moving slowly or  fidgety/restless 0  Suicidal thoughts 0  PHQ-9 Score 8  Difficult doing work/chores -  Some recent data might be hidden   Lab Results  Component Value Date   HGBA1C 8.4 (H) 12/01/2016   A1C today: 7.6  ASSESSMENT AND PLAN: 1. Iron deficiency anemia, unspecified iron deficiency anemia type and ACD -Patient reports being told Hb dropped from 8 to 6. Nephrologist's office will let her know tomorrow about Epogen/Aricept.  I did not repeat lab today. Pt will call me later this wk when she hears back from nephrology.   Since I see no documented colonoscopy report, we discussed referral to GI for EGD/colonoscopy Ambulatory referral to Gastroenterology  2. Type 2 diabetes mellitus with complication, with long-term current use of insulin (HCC) -Goal is to avoid frequent hypoglycemia in this patient with multiple comorbidities which may mean maintaining A1c between 7 and 8.  -Decrease NovoLog to 3 units with meals or with just the largest meal of the day. Let me know if she experiences hypoglycemic episodes - insulin aspart (NOVOLOG) 100 UNIT/ML injection; 3 units before meals.  Dispense: 60 mL; Refill: 3  3. Diabetic polyneuropathy associated with type 2 diabetes mellitus (HCC) -On Gabapentin  4. Essential hypertension -Not at goal but she has not taken meds as yet for the morning.  5. CKD stage 4 due to type 2 diabetes mellitus ([place]) -Followed by a nephrologist  6. Systolic congestive heart failure, unspecified HF chronicity (HCC) -Stable. Followed by cardiology  7. Anxiety and depression Continue Lexapro  Patient was given the opportunity to ask questions.  Patient verbalized understanding of the plan and was able to repeat key elements of the plan.   Orders Placed This Encounter  Procedures  . Ambulatory referral to Gastroenterology     Requested Prescriptions   Signed Prescriptions Disp Refills  . insulin aspart (NOVOLOG) 100 UNIT/ML injection 60 mL 3    Sig: 3 units  before meals.    Return in about 2 months (around 06/06/2017).  Karle Plumber, MD, FACP

## 2017-04-06 NOTE — Patient Instructions (Signed)
I have referred her to a gastroenterologist for consideration of colonoscopy and endoscopy.  Take NovoLog insulin 3 units with the largest meal of the day. Continue to monitor your blood sugars. Keep your appointment with the ophthalmologist later this week.

## 2017-04-07 ENCOUNTER — Telehealth: Payer: Self-pay | Admitting: Internal Medicine

## 2017-04-07 NOTE — Telephone Encounter (Signed)
Pt calling to request a call from PCP [facility]. States that she was advised to get a blood transfusion and does not want to do so. Please call back as soon as possible.

## 2017-04-07 NOTE — Telephone Encounter (Signed)
PC returned to pt. Pt said she had the additional blood test requested by nephrologist.  Hb in range 6.  They recommended she have blood transfusion in a.m and will receive shot from them next wk (?Aricept). Pt hesitant about blood transfusion and said her daughters are afraid for her to get it too. I told pt what normal H/H level is; but pts with CKD can have anemia associated with this dx. 6 is a little too low to the point where she will start having dizziness, SOB, CP or even near fainting if she drops any lower. I recommend getting the transfusion as recommended .  The Aricept or Procrit should then help to maintain. Also advised that blood is tested these days for significant pathogens like HIV, Hep C. Pt thanked me for returning call and said she will discuss with her daughters.

## 2017-04-08 ENCOUNTER — Encounter (HOSPITAL_COMMUNITY)
Admission: RE | Admit: 2017-04-08 | Discharge: 2017-04-08 | Disposition: A | Discharge status: Home / Self Care | Payer: Medicaid Other | Source: Ambulatory Visit | Attending: Nephrology | Admitting: Nephrology

## 2017-04-08 DIAGNOSIS — D631 Anemia in chronic kidney disease: Secondary | ICD-10-CM | POA: Insufficient documentation

## 2017-04-08 LAB — PREPARE RBC (CROSSMATCH)

## 2017-04-08 LAB — ABO/RH: ABO/RH(D): B POS

## 2017-04-08 MED ORDER — DIPHENHYDRAMINE HCL 25 MG PO CAPS
25.0000 mg | ORAL_CAPSULE | Freq: Once | ORAL | Status: AC
  Administered 2017-04-08: 09:00:00 25 mg via ORAL

## 2017-04-08 MED ORDER — ACETAMINOPHEN 325 MG PO TABS
ORAL_TABLET | ORAL | Status: AC
  Filled 2017-04-08: qty 2

## 2017-04-08 MED ORDER — SODIUM CHLORIDE 0.9 % IV SOLN
Freq: Once | INTRAVENOUS | Status: AC
  Administered 2017-04-08: 09:00:00 via INTRAVENOUS

## 2017-04-08 MED ORDER — ACETAMINOPHEN 325 MG PO TABS
650.0000 mg | ORAL_TABLET | Freq: Once | ORAL | Status: AC
  Administered 2017-04-08: 650 mg via ORAL

## 2017-04-08 MED ORDER — DIPHENHYDRAMINE HCL 25 MG PO CAPS
ORAL_CAPSULE | ORAL | Status: AC
  Filled 2017-04-08: qty 1

## 2017-04-08 NOTE — Progress Notes (Signed)
Pt's blood pressure up at completion of first unit PRBC's from initial readings.  Pt states that she did not take any of her usual meds prior to coming to the hospital.  Daughter is getting meds from home for pt to take.  Will continue to monitor.

## 2017-04-09 LAB — BPAM RBC
BLOOD PRODUCT EXPIRATION DATE: 201806302359
Blood Product Expiration Date: 201807152359
ISSUE DATE / TIME: 201806280927
ISSUE DATE / TIME: 201806281212
Unit Type and Rh: 7300
Unit Type and Rh: 7300

## 2017-04-09 LAB — TYPE AND SCREEN
ABO/RH(D): B POS
Antibody Screen: NEGATIVE
Unit division: 0
Unit division: 0

## 2017-04-09 MED FILL — LANTUS SOLOSTAR 100 UNITS/M: 100 | 25 days supply | Qty: 15 | Fill #1

## 2017-04-12 ENCOUNTER — Encounter (HOSPITAL_COMMUNITY)
Admission: RE | Admit: 2017-04-12 | Discharge: 2017-04-12 | Disposition: A | Discharge status: Home / Self Care | Payer: Medicaid Other | Source: Ambulatory Visit | Attending: Nephrology | Admitting: Nephrology

## 2017-04-12 DIAGNOSIS — D631 Anemia in chronic kidney disease: Secondary | ICD-10-CM | POA: Insufficient documentation

## 2017-04-12 DIAGNOSIS — N184 Chronic kidney disease, stage 4 (severe): Secondary | ICD-10-CM | POA: Diagnosis present

## 2017-04-12 LAB — POCT HEMOGLOBIN-HEMACUE: Hemoglobin: 9.6 g/dL — ABNORMAL LOW (ref 12.0–15.0)

## 2017-04-12 MED ORDER — DARBEPOETIN ALFA 100 MCG/0.5ML IJ SOSY
PREFILLED_SYRINGE | INTRAMUSCULAR | Status: AC
  Administered 2017-04-12: 100 ug via SUBCUTANEOUS
  Filled 2017-04-12: qty 0.5

## 2017-04-12 NOTE — Discharge Instructions (Signed)

## 2017-04-15 ENCOUNTER — Telehealth: Payer: Self-pay | Admitting: Cardiovascular Disease

## 2017-04-15 NOTE — Telephone Encounter (Signed)
Received records from [facility] for appointment on 04/22/17 with Dr Sallyanne Kuster.  Records put with Dr Croitoru's schedule for 04/22/17. lp

## 2017-04-17 ENCOUNTER — Other Ambulatory Visit: Payer: Self-pay | Admitting: Student

## 2017-04-17 ENCOUNTER — Other Ambulatory Visit: Payer: Self-pay | Admitting: Physician Assistant

## 2017-04-17 MED ORDER — CARVEDILOL 25 MG PO TABS: 25.0000 mg | ORAL_TABLET | Freq: Two times a day (BID) | ORAL | 3 refills | Status: DC

## 2017-04-19 MED FILL — hydrALAZINE HCL 25 MG TABS: 25 | 30 days supply | Qty: 90 | Fill #2

## 2017-04-19 MED FILL — GABAPENTIN 100 MG CAPSULE: 100 | 30 days supply | Qty: 30 | Fill #4

## 2017-04-19 MED FILL — ESCITALOPRAM 20 MG TABLET: 20 | 30 days supply | Qty: 30 | Fill #2

## 2017-04-19 MED FILL — CARVEDILOL 25 MG TABLET: 25 | 30 days supply | Qty: 60 | Fill #1

## 2017-04-19 MED FILL — glipiZIDE 10 MG TABS: 10 | 30 days supply | Qty: 60 | Fill #3

## 2017-04-19 MED FILL — ISOSORBIDE MN ER 60 MG TAB: 60 | 30 days supply | Qty: 30 | Fill #1

## 2017-04-19 NOTE — Telephone Encounter (Signed)
Please review for refill, Thanks !  

## 2017-04-22 ENCOUNTER — Encounter: Payer: Self-pay | Admitting: Cardiovascular Disease

## 2017-04-22 ENCOUNTER — Ambulatory Visit (INDEPENDENT_AMBULATORY_CARE_PROVIDER_SITE_OTHER): Payer: Medicaid Other | Admitting: Cardiovascular Disease

## 2017-04-22 VITALS — BP 148/70 | HR 80 | Ht 66.0 in | Wt 169.0 lb

## 2017-04-22 DIAGNOSIS — I10 Essential (primary) hypertension: Secondary | ICD-10-CM | POA: Diagnosis not present

## 2017-04-22 DIAGNOSIS — Z794 Long term (current) use of insulin: Secondary | ICD-10-CM | POA: Diagnosis not present

## 2017-04-22 DIAGNOSIS — R0602 Shortness of breath: Secondary | ICD-10-CM

## 2017-04-22 DIAGNOSIS — N184 Chronic kidney disease, stage 4 (severe): Secondary | ICD-10-CM | POA: Diagnosis not present

## 2017-04-22 DIAGNOSIS — I5022 Chronic systolic (congestive) heart failure: Secondary | ICD-10-CM | POA: Diagnosis not present

## 2017-04-22 DIAGNOSIS — D649 Anemia, unspecified: Secondary | ICD-10-CM | POA: Diagnosis not present

## 2017-04-22 DIAGNOSIS — E119 Type 2 diabetes mellitus without complications: Secondary | ICD-10-CM

## 2017-04-22 NOTE — Patient Instructions (Signed)
Medication Instructions: Dr Sallyanne Kuster recommends that you continue on your current medications as directed. Please refer to the Current Medication list given to you today.  Labwork: NONE ORDERED  Testing/Procedures: 1. [place] physician has requested that you have a lexiscan myoview. For further information please visit [email or web address]. Please follow instruction sheet, as given.  Follow-up: Dr Sallyanne Kuster recommends that you schedule a follow-up appointment in 2 months.  If you need a refill on your cardiac medications before your next appointment, please call your pharmacy.
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
## 2017-04-27 MED FILL — ATORVASTATIN 20 MG TABLET: 20 | 30 days supply | Qty: 15 | Fill #3

## 2017-04-27 MED FILL — ?LISINOPRIL 5 MG TABLET: 5 | 30 days supply | Qty: 15 | Fill #3

## 2017-04-28 ENCOUNTER — Telehealth (HOSPITAL_COMMUNITY): Payer: Self-pay

## 2017-04-28 NOTE — Telephone Encounter (Signed)
Encounter complete. 

## 2017-04-30 ENCOUNTER — Ambulatory Visit (HOSPITAL_COMMUNITY)
Admission: RE | Admit: 2017-04-30 | Discharge: 2017-04-30 | Disposition: A | Discharge status: Home / Self Care | Payer: Medicaid Other | Source: Ambulatory Visit | Attending: Cardiovascular Disease | Admitting: Cardiovascular Disease

## 2017-04-30 ENCOUNTER — Other Ambulatory Visit: Payer: Self-pay | Admitting: Internal Medicine

## 2017-04-30 DIAGNOSIS — I5022 Chronic systolic (congestive) heart failure: Secondary | ICD-10-CM | POA: Insufficient documentation

## 2017-04-30 DIAGNOSIS — N184 Chronic kidney disease, stage 4 (severe): Secondary | ICD-10-CM | POA: Diagnosis not present

## 2017-04-30 DIAGNOSIS — R42 Dizziness and giddiness: Secondary | ICD-10-CM | POA: Insufficient documentation

## 2017-04-30 DIAGNOSIS — E1122 Type 2 diabetes mellitus with diabetic chronic kidney disease: Secondary | ICD-10-CM | POA: Insufficient documentation

## 2017-04-30 DIAGNOSIS — I130 Hypertensive heart and chronic kidney disease with heart failure and stage 1 through stage 4 chronic kidney disease, or unspecified chronic kidney disease: Secondary | ICD-10-CM | POA: Diagnosis not present

## 2017-04-30 DIAGNOSIS — Z6827 Body mass index (BMI) 27.0-27.9, adult: Secondary | ICD-10-CM | POA: Insufficient documentation

## 2017-04-30 DIAGNOSIS — R0609 Other forms of dyspnea: Secondary | ICD-10-CM | POA: Diagnosis not present

## 2017-04-30 DIAGNOSIS — R55 Syncope and collapse: Secondary | ICD-10-CM | POA: Diagnosis not present

## 2017-04-30 DIAGNOSIS — E669 Obesity, unspecified: Secondary | ICD-10-CM | POA: Diagnosis not present

## 2017-04-30 DIAGNOSIS — R002 Palpitations: Secondary | ICD-10-CM | POA: Insufficient documentation

## 2017-04-30 DIAGNOSIS — R0602 Shortness of breath: Secondary | ICD-10-CM | POA: Diagnosis not present

## 2017-04-30 LAB — MYOCARDIAL PERFUSION IMAGING
CHL CUP NUCLEAR SRS: 1
CHL CUP NUCLEAR SSS: 4
LVDIAVOL: 156 mL (ref 46–106)
LVSYSVOL: 93 mL
NUC STRESS TID: 1.21
Peak HR: 90 {beats}/min
Rest HR: 69 {beats}/min
SDS: 3

## 2017-04-30 MED ORDER — TECHNETIUM TC 99M TETROFOSMIN IV KIT
10.2000 | PACK | Freq: Once | INTRAVENOUS | Status: AC | PRN
  Administered 2017-04-30: 10.2 via INTRAVENOUS
  Filled 2017-04-30: qty 11

## 2017-04-30 MED ORDER — AMINOPHYLLINE 25 MG/ML IV SOLN
75.0000 mg | Freq: Once | INTRAVENOUS | Status: AC
  Administered 2017-04-30: 75 mg via INTRAVENOUS

## 2017-04-30 MED ORDER — TECHNETIUM TC 99M TETROFOSMIN IV KIT
31.0000 | PACK | Freq: Once | INTRAVENOUS | Status: AC | PRN
  Administered 2017-04-30: 31 via INTRAVENOUS
  Filled 2017-04-30: qty 31

## 2017-04-30 MED ORDER — REGADENOSON 0.4 MG/5ML IV SOLN
0.4000 mg | Freq: Once | INTRAVENOUS | Status: AC
  Administered 2017-04-30: 0.4 mg via INTRAVENOUS

## 2017-04-30 MED ORDER — FERROUS SULFATE 325 (65 FE) MG PO TABS: 325.0000 mg | ORAL_TABLET | Freq: Two times a day (BID) | ORAL | 2 refills | Status: DC

## 2017-04-30 MED FILL — FERROUS SULFATE 325 MG TAB: 325 (65 FE) | 30 days supply | Qty: 60 | Fill #0

## 2017-05-10 ENCOUNTER — Encounter (HOSPITAL_COMMUNITY)
Admission: RE | Admit: 2017-05-10 | Discharge: 2017-05-10 | Disposition: A | Discharge status: Home / Self Care | Payer: Medicaid Other | Source: Ambulatory Visit | Attending: Nephrology | Admitting: Nephrology

## 2017-05-10 DIAGNOSIS — N184 Chronic kidney disease, stage 4 (severe): Secondary | ICD-10-CM | POA: Diagnosis not present

## 2017-05-10 LAB — POCT HEMOGLOBIN-HEMACUE: Hemoglobin: 8.7 g/dL — ABNORMAL LOW (ref 12.0–15.0)

## 2017-05-10 MED ORDER — DARBEPOETIN ALFA 100 MCG/0.5ML IJ SOSY
100.0000 ug | PREFILLED_SYRINGE | INTRAMUSCULAR | Status: DC
  Administered 2017-05-10: 100 ug via SUBCUTANEOUS

## 2017-05-10 MED ORDER — DARBEPOETIN ALFA 100 MCG/0.5ML IJ SOSY
PREFILLED_SYRINGE | INTRAMUSCULAR | Status: AC
  Filled 2017-05-10: qty 0.5

## 2017-05-18 MED FILL — GABAPENTIN 100 MG CAP: 100 | 30 days supply | Qty: 30 | Fill #5

## 2017-05-18 MED FILL — glipiZIDE 10 MG TABS: 10 | 30 days supply | Qty: 60 | Fill #4

## 2017-05-18 MED FILL — ATORVASTATIN 20 MG TABLET: 20 | 30 days supply | Qty: 15 | Fill #4

## 2017-05-20 ENCOUNTER — Encounter: Payer: Self-pay | Admitting: Internal Medicine

## 2017-05-20 ENCOUNTER — Ambulatory Visit: Payer: Medicaid Other | Attending: Internal Medicine | Admitting: Internal Medicine

## 2017-05-20 ENCOUNTER — Telehealth: Payer: Self-pay

## 2017-05-20 VITALS — BP 178/80 | HR 78 | Temp 98.3°F | Resp 16 | Wt 173.8 lb

## 2017-05-20 DIAGNOSIS — I5020 Unspecified systolic (congestive) heart failure: Secondary | ICD-10-CM

## 2017-05-20 DIAGNOSIS — E118 Type 2 diabetes mellitus with unspecified complications: Secondary | ICD-10-CM

## 2017-05-20 DIAGNOSIS — I5022 Chronic systolic (congestive) heart failure: Secondary | ICD-10-CM | POA: Insufficient documentation

## 2017-05-20 DIAGNOSIS — Z794 Long term (current) use of insulin: Secondary | ICD-10-CM | POA: Insufficient documentation

## 2017-05-20 DIAGNOSIS — Z7982 Long term (current) use of aspirin: Secondary | ICD-10-CM | POA: Insufficient documentation

## 2017-05-20 DIAGNOSIS — I10 Essential (primary) hypertension: Secondary | ICD-10-CM | POA: Diagnosis not present

## 2017-05-20 DIAGNOSIS — I429 Cardiomyopathy, unspecified: Secondary | ICD-10-CM | POA: Diagnosis not present

## 2017-05-20 DIAGNOSIS — F419 Anxiety disorder, unspecified: Secondary | ICD-10-CM | POA: Diagnosis not present

## 2017-05-20 DIAGNOSIS — D509 Iron deficiency anemia, unspecified: Secondary | ICD-10-CM | POA: Diagnosis not present

## 2017-05-20 DIAGNOSIS — I130 Hypertensive heart and chronic kidney disease with heart failure and stage 1 through stage 4 chronic kidney disease, or unspecified chronic kidney disease: Secondary | ICD-10-CM | POA: Diagnosis not present

## 2017-05-20 DIAGNOSIS — E1121 Type 2 diabetes mellitus with diabetic nephropathy: Secondary | ICD-10-CM | POA: Diagnosis not present

## 2017-05-20 DIAGNOSIS — E785 Hyperlipidemia, unspecified: Secondary | ICD-10-CM | POA: Diagnosis not present

## 2017-05-20 DIAGNOSIS — E1122 Type 2 diabetes mellitus with diabetic chronic kidney disease: Secondary | ICD-10-CM | POA: Diagnosis present

## 2017-05-20 DIAGNOSIS — R42 Dizziness and giddiness: Secondary | ICD-10-CM | POA: Insufficient documentation

## 2017-05-20 DIAGNOSIS — R519 Headache, unspecified: Secondary | ICD-10-CM

## 2017-05-20 DIAGNOSIS — R740 Nonspecific elevation of levels of transaminase and lactic acid dehydrogenase [LDH]: Secondary | ICD-10-CM | POA: Insufficient documentation

## 2017-05-20 DIAGNOSIS — R51 Headache: Secondary | ICD-10-CM

## 2017-05-20 DIAGNOSIS — F329 Major depressive disorder, single episode, unspecified: Secondary | ICD-10-CM | POA: Diagnosis not present

## 2017-05-20 DIAGNOSIS — E113319 Type 2 diabetes mellitus with moderate nonproliferative diabetic retinopathy with macular edema, unspecified eye: Secondary | ICD-10-CM | POA: Diagnosis not present

## 2017-05-20 DIAGNOSIS — N184 Chronic kidney disease, stage 4 (severe): Secondary | ICD-10-CM | POA: Diagnosis not present

## 2017-05-20 DIAGNOSIS — K029 Dental caries, unspecified: Secondary | ICD-10-CM | POA: Diagnosis not present

## 2017-05-20 DIAGNOSIS — E119 Type 2 diabetes mellitus without complications: Secondary | ICD-10-CM | POA: Diagnosis not present

## 2017-05-20 LAB — GLUCOSE, POCT (MANUAL RESULT ENTRY): POC GLUCOSE: 200 mg/dL — AB (ref 70–99)

## 2017-05-20 MED ORDER — HYDRALAZINE HCL 25 MG PO TABS: 25.0000 mg | ORAL_TABLET | Freq: Three times a day (TID) | ORAL | 5 refills | Status: DC

## 2017-05-20 MED ORDER — AMOXICILLIN-POT CLAVULANATE 500-125 MG PO TABS: 1.0000 | ORAL_TABLET | Freq: Two times a day (BID) | ORAL | 0 refills | Status: DC

## 2017-05-20 MED FILL — hydrALAZINE HCL 25 MG TABS: 25 | 30 days supply | Qty: 90 | Fill #0

## 2017-05-20 NOTE — Patient Instructions (Addendum)
Give appt with [facility] in 1 wk for BP recheck.   Increase Hydralazine to 25 mg three times a day.  Check your blood pressure daily.  Goal is 130/85 or lower.  Take Augmentin for the sinus infection.

## 2017-05-20 NOTE — Telephone Encounter (Signed)
Contacted pt to inform her that our pharmacy does not have the antibiotics that Dr. Wynetta Emery prescribed. Wanted to know what pharmacy she wanted the rx sent to.

## 2017-05-20 NOTE — Progress Notes (Signed)
Patient ID: Patricia [name], female    DOB: [birth year]  MRN: [id number]  CC: Sinus Problem; Ear Pain; and Headache   Subjective: Patricia [name] is a 61 y.o. 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  1.  CKD/ACD -she did get 2 units of PRBC and now gets shot once a mth -dizziness and fatigue better  2. C/o pain RT side of head radiating to RT ear x 2 days. -+sinus congestion and drainage and pressure over RT forehead, around eye and over maxillary sinus -yesterday HA was pounding. No blurred vision -just got new glasses last week.  Bifocals.  No eye strain.  Pain in lower eye lid. -BP high since yesterday.  170/100+ incr Hydralazine from 12.5 mg  to full tab TID yesterday.  Did not take meds as yet for the morning -no hx of HA in past  3. DM: checks BS BID in a.m and p.m at bedtimes. Range has been in 130s Had shake this a.m for breakfast -had to use Novolog one time since last visit with a.m BS in 200 -Taking Lantus and Glucotrol as prescribed  4. Anemia:   Appt with GI on 8/16  5.  CHF:  Saw Dr. Sallyanne Kuster and had nuclear ST EF has improved.  Results copied below:  The left ventricular ejection fraction is moderately decreased (30-44%).  Nuclear stress EF: 40%.  The study is normal.  This is a low risk study.  There was no ST segment deviation noted during stress.  Normal pharmacologic nuclear stress test with no evidence for prior infarct or ischemia. LVEF 40% with diffuse hypokinesis.  Patient Active Problem List   Diagnosis Date Noted  . Systolic CHF ([place]) 00/93/8182  . Diabetes mellitus, type II, insulin dependent ([place]) 11/29/2016  . Normocytic anemia 11/29/2016  . Transaminasemia 11/29/2016  . Cardiomyopathy  ([place]) 11/12/2016  . Lipoma 10/21/2016  . CKD (chronic kidney disease) stage 4, GFR 15-29 ml/min (HCC) 08/10/2016  . Anxiety and depression 12/12/2015  . Diabetic nephropathy associated with type 2 diabetes mellitus ([place]) 11/15/2014  . Colon cancer screening 10/01/2014  . Moderate nonproliferative diabetic retinopathy(362.05) 04/17/2014  . 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 Essential hypertension 03/28/2014  . Dental caries 03/28/2014  . Dyslipidemia 04/18/2013     Current Outpatient Prescriptions on File Prior to Visit  Medication Sig Dispense Refill  . alum & mag hydroxide-simeth (MAALOX/MYLANTA) 200-200-20 MG/5ML suspension Take 15 mLs by mouth every 6 (six) hours as needed for indigestion or heartburn.    Marland Kitchen aspirin EC 81 MG tablet Take 1 tablet (81 mg total) by mouth daily. 90 tablet 3  . atorvastatin (LIPITOR) 20 MG tablet Take 0.5 tablets (10 mg total) by mouth daily. 90 tablet 3  . carvedilol (COREG) 25 MG tablet TAKE 1 TABLET BY MOUTH TWICE DAILY WITH MEALS 60 tablet 9  . carvedilol (COREG) 25 MG tablet Take 1 tablet (25 mg total) by mouth 2 (two) times daily with a meal. 180 tablet 3  . escitalopram (LEXAPRO) 20 MG tablet Take 1 tablet (20 mg total) by mouth at bedtime. 30 tablet 2  . ferrous sulfate 325 (65 FE) MG tablet Take 1 tablet (325 mg total) by mouth 2 (two) times daily with a meal.  60 tablet 2  . furosemide (LASIX) 40 MG tablet Take 1 tablet (40 mg total) by mouth daily. 90 tablet 1  . gabapentin (NEURONTIN) 100 MG capsule Take 1 capsule (100 mg total) by mouth at bedtime. 90 capsule 3  . glipiZIDE (GLUCOTROL) 10 MG tablet TAKE 1 TABLET BY MOUTH 2 TIMES DAILY BEFORE A MEAL. 180 tablet 3  . glucose blood (TRUE METRIX BLOOD GLUCOSE TEST) test strip Use as instructed 100 each 12  . insulin aspart (NOVOLOG) 100 UNIT/ML injection 3 units before meals. 60 mL 3  . Insulin Glargine (LANTUS SOLOSTAR) 100 UNIT/ML Solostar Pen Inject 30 Units into the skin 2 (two) times  daily. 90 mL 3  . Insulin Pen Needle 29G X 12.7MM MISC 10 Units by Does not apply route 1 day or 1 dose. 100 each 11  . Insulin Syringe-Needle U-100 30G X 5/16" 1 ML MISC Test 3 times daily 100 each 11  . isosorbide mononitrate (IMDUR) 60 MG 24 hr tablet Take 1 tablet (60 mg total) by mouth daily. 90 tablet 1  . ondansetron (ZOFRAN) 4 MG tablet Take 1 tablet (4 mg total) by mouth every 8 (eight) hours as needed for nausea or vomiting. 12 tablet 0  . senna (SENOKOT) 8.6 MG TABS tablet Take 1 tablet (8.6 mg total) by mouth daily as needed for mild constipation. 120 each 0  . TRUEPLUS LANCETS 28G MISC Use as directed 100 each 12  . [DISCONTINUED] quinapril (ACCUPRIL) 5 MG tablet Take by mouth at bedtime.     No current facility-administered medications on file prior to visit.     Allergies  Allergen Reactions  . Codone [Hydrocodone] Itching  . Norvasc [Amlodipine Besylate] Swelling    Lower extremity?  . Hydralazine Other (See Comments)    Leg swelling  . Sulfa Antibiotics Itching and Rash    Social History   Social History  . Marital status: Divorced    Spouse name: N/A  . Number of children: N/A  . Years of education: N/A   Occupational History  . Caregiver [employer]   Social History Main Topics  . Smoking status: Never Smoker  . Smokeless tobacco: Never Used  . Alcohol use No  . Drug use: No  . Sexual activity: Not Currently    Birth control/ protection: Surgical   Other Topics Concern  . Not on file   Social History Narrative  . No narrative on file    Family History  Problem Relation Age of Onset  . Diabetes Brother   . Cancer Brother        stomach    Past Surgical History:  Procedure Laterality Date  . ABDOMINAL HYSTERECTOMY    . BREAST SURGERY     reduction  . CESAREAN SECTION  1982, 1988    ROS: Review of Systems Negative except as stated above PHYSICAL EXAM: BP (!) 178/80   Pulse 78   Temp 98.3 F (36.8 C) (Oral)   Resp 16   Wt 173  lb 12.8 oz (78.8 kg)   SpO2 97%   BMI 28.05 kg/m   Physical Exam General appearance - alert, well appearing, middle age to older African-American female and in no distress Mental status - alert, oriented to person, place, and time, normal mood, behavior, speech, dress, motor activity, and thought processes Eyes - pupils equal and reactive, extraocular eye movements intact Ears - both canals were within normal limits. Dull light reflex on the right Nose - mild enlargement of  nasal turbinates. Clear mucus Mouth - mucous membranes moist, pharynx normal without lesions Neck - supple, no significant adenopathy Chest - clear to auscultation, no wheezes, rales or rhonchi, symmetric air entry Heart - RRR, soft SEM LT upper sternal border Neurological - cranial nerves II through XII intact.  Power 5/5 in all 4s.  Dec sensation to touch feet.  No tenderness on palpation of scalp or tenderness over temporal arteries Extremities - no LE edema  Results for orders placed or performed in visit on 05/20/17  POCT glucose (manual entry)  Result Value Ref Range   POC Glucose 200 (A) 70 - 99 mg/dl    ASSESSMENT AND PLAN: 1. Acute intractable headache, unspecified headache type -DDx include due to HTN vs sinusitis -Increase hydralazine to 25 mg 3 times a day Follow-up in one week for nurse only blood pressure check - amoxicillin-clavulanate (AUGMENTIN) 500-125 MG tablet; Take 1 tablet (500 mg total) by mouth 2 (two) times daily.  Dispense: 14 tablet; Refill: 0  2. Essential hypertension -See plan above in #1 - hydrALAZINE (APRESOLINE) 25 MG tablet; Take 1 tablet (25 mg total) by mouth every 8 (eight) hours. Dose increase  Dispense: 90 tablet; Refill: 5  3. Iron deficiency anemia, unspecified iron deficiency anemia type -mixed ACD and iron def -Keep appointment with gastroenterology  4. Diabetes mellitus, type II, with complication and long term use of insulin -Continue Lantus and Glucotrol. - POCT  glucose (manual entry)  5.  Systolic CHF -EF improved and recent nuclear study negative for ischemic changes. We will defer to cardiologist for further recommendations  Patient was given the opportunity to ask questions.  Patient verbalized understanding of the plan and was able to repeat key elements of the plan.   Orders Placed This Encounter  Procedures  . POCT glucose (manual entry)     Requested Prescriptions   Signed Prescriptions Disp Refills  . hydrALAZINE (APRESOLINE) 25 MG tablet 90 tablet 5    Sig: Take 1 tablet (25 mg total) by mouth every 8 (eight) hours. Dose increase  . amoxicillin-clavulanate (AUGMENTIN) 500-125 MG tablet 14 tablet 0    Sig: Take 1 tablet (500 mg total) by mouth 2 (two) times daily.    Return in about 10 weeks (around 07/29/2017).  Karle Plumber, MD, FACP

## 2017-05-21 ENCOUNTER — Other Ambulatory Visit: Payer: Self-pay | Admitting: Internal Medicine

## 2017-05-21 DIAGNOSIS — R519 Headache, unspecified: Secondary | ICD-10-CM

## 2017-05-21 DIAGNOSIS — R51 Headache: Principal | ICD-10-CM

## 2017-05-21 DIAGNOSIS — F419 Anxiety disorder, unspecified: Principal | ICD-10-CM

## 2017-05-21 DIAGNOSIS — F329 Major depressive disorder, single episode, unspecified: Secondary | ICD-10-CM

## 2017-05-21 MED ORDER — ESCITALOPRAM OXALATE 20 MG PO TABS: 20.0000 mg | ORAL_TABLET | Freq: Every day | ORAL | 5 refills | Status: DC

## 2017-05-21 MED ORDER — AMOXICILLIN-POT CLAVULANATE 500-125 MG PO TABS: 1.0000 | ORAL_TABLET | Freq: Two times a day (BID) | ORAL | 0 refills | Status: DC

## 2017-05-21 MED FILL — ESCITALOPRAM 20 MG TABLET: 20 | 30 days supply | Qty: 30 | Fill #0

## 2017-05-27 ENCOUNTER — Ambulatory Visit (INDEPENDENT_AMBULATORY_CARE_PROVIDER_SITE_OTHER): Payer: Medicaid Other | Admitting: Gastroenterology

## 2017-05-27 ENCOUNTER — Encounter: Payer: Self-pay | Admitting: Gastroenterology

## 2017-05-27 VITALS — BP 184/86 | HR 80 | Ht 65.0 in | Wt 175.2 lb

## 2017-05-27 DIAGNOSIS — I5022 Chronic systolic (congestive) heart failure: Secondary | ICD-10-CM | POA: Diagnosis not present

## 2017-05-27 DIAGNOSIS — E118 Type 2 diabetes mellitus with unspecified complications: Secondary | ICD-10-CM | POA: Diagnosis not present

## 2017-05-27 DIAGNOSIS — R140 Abdominal distension (gaseous): Secondary | ICD-10-CM | POA: Diagnosis not present

## 2017-05-27 DIAGNOSIS — D509 Iron deficiency anemia, unspecified: Secondary | ICD-10-CM | POA: Diagnosis not present

## 2017-05-27 DIAGNOSIS — Z794 Long term (current) use of insulin: Secondary | ICD-10-CM

## 2017-05-27 DIAGNOSIS — N184 Chronic kidney disease, stage 4 (severe): Secondary | ICD-10-CM

## 2017-05-27 MED ORDER — NA SULFATE-K SULFATE-MG SULF 17.5-3.13-1.6 GM/177ML PO SOLN: 1.0000 | Freq: Once | ORAL | 0 refills | Status: AC

## 2017-05-27 MED FILL — SUPREP BOWEL PREP KIT: 17.5-3.13-1 | 1 days supply | Qty: 354 | Fill #0

## 2017-05-27 NOTE — Progress Notes (Signed)
[facility] Consult Note:  History: Patricia [name] 05/27/2017  Referring physician: Ladell Pier, MD  Reason for consult/chief complaint: iron deficiency anemia (pt c/o generalized abdominal pain intermittently with nausea x 1 month.  Pt states no other complaints.)   Subjective  HPI:   04/06/17, Dr.Deborah Wynetta Emery wrote "Patricia [name] is a 61 y.o. female who presents for chronic disease management and to become establish with me as PCP. Her concerns today include:  Patient with history of diabetes type 2 with retinopathy,neuropathy and nephropathy (CKD stage 4), systolic CHF with EF of 00-34% as of 11/2016 possible Takotsubo variant, mixed anemia ACD and IDA, HL and depression.   Since last visit patient was seen by cardiology in April. Her orthostatic dizziness was thought to be due either to the anemia or medications. Hydralazine dose was cut in half. Also seen by nephrologist who started her on low dose of lisinopril. Seen in ER 01/30/2017 for dizziness. Again thought to be due to medication, anemia or combination of both. Hydralazine dose change from 3 times a day to twice a day."  On 05/01/17, Dr Orene Desanctis wrote"Patricia [name] is a 61 y.o. female with a hx of Congestive heart failure with severely depressed left ventricular systolic function (EF 91-79%) diagnosed in February 2018. Workup was delayed by the simultaneous diagnosis of advanced deficiency, now stabilized in a pattern of chronic kidney disease stage III- IV with a baseline creatinine around 2.5, estimated GFR 25 mL/minute (followed by Dr. Joelyn Oms) and severe anemia which appears to be a combination of erythropoietin deficiency and iron deficiency. She has long-standing poorly controlled diabetes mellitus and has proteinuria and sensory neuropathy. Despite iron supplementation, iron studies continue to show deficiency and she has persistent anemia. She underwent 2 unit blood transfusion with an increase of  hemoglobin 7.0 to 9.6. She has referred for GI evaluation/colonoscopy with an appointment scheduled in mid August."  Nuc stress last month without ischemia.  EF 40%   Patricia [name] was referred to see me for iron deficiency anemia. For the last month she has had some generalized bloating and gas and intermittent brief migratory abdominal pain that is somewhat difficult to characterize. She denies dysphagia, odynophagia, nausea, vomiting, early satiety, altered bowel habits, rectal bleeding or weight loss.  ROS:  Review of Systems  Constitutional: Positive for fatigue. Negative for appetite change and unexpected weight change.  HENT: Negative for mouth sores and voice change.   Eyes: Negative for pain and redness.  Respiratory: Positive for shortness of breath. Negative for cough.   Cardiovascular: Negative for chest pain and palpitations.  Genitourinary: Negative for dysuria and hematuria.  Musculoskeletal: Negative for arthralgias and myalgias.  Skin: Negative for pallor and rash.  Neurological: Negative for weakness and headaches.  Hematological: Negative for adenopathy.     Past Medical History: Past Medical History:  Diagnosis Date  . Cardiomyopathy ([place]) 11/2016   no ischemic eval due to CKD  . CKD (chronic kidney disease), stage IV ([place])   . Diabetes mellitus without complication ([place])   . Hypertension   . Obesity      Past Surgical History: Past Surgical History:  Procedure Laterality Date  . ABDOMINAL HYSTERECTOMY    . BREAST SURGERY     reduction  . CESAREAN SECTION  1982, 67     Family History: Family History  Problem Relation Age of Onset  . Diabetes Brother   . Stomach cancer Brother   . Kidney disease Brother   .  Heart Problems Maternal Grandmother        pacemaker  . Heart disease Maternal Grandfather   . Rectal cancer Neg Hx   . Esophageal cancer Neg Hx   . Liver cancer Neg Hx   . Colon cancer Neg Hx   brother's history unclear  Social  History: Social History   Social History  . Marital status: Divorced    Spouse name: N/A  . Number of children: 2  . Years of education: N/A   Occupational History  . disabled    Social History Main Topics  . Smoking status: Never Smoker  . Smokeless tobacco: Never Used  . Alcohol use No  . Drug use: No  . Sexual activity: Not Currently    Birth control/ protection: Surgical   Other Topics Concern  . None   Social History Narrative  . None    Allergies: Allergies  Allergen Reactions  . Codone [Hydrocodone] Itching  . Norvasc [Amlodipine Besylate] Swelling    Lower extremity?  . Hydralazine Other (See Comments)    Leg swelling  . Sulfa Antibiotics Itching and Rash    Outpatient Meds: Current Outpatient Prescriptions  Medication Sig Dispense Refill  . amoxicillin-clavulanate (AUGMENTIN) 500-125 MG tablet Take 1 tablet (500 mg total) by mouth 2 (two) times daily. 14 tablet 0  . aspirin EC 81 MG tablet Take 1 tablet (81 mg total) by mouth daily. 90 tablet 3  . atorvastatin (LIPITOR) 20 MG tablet Take 0.5 tablets (10 mg total) by mouth daily. 90 tablet 3  . bismuth subsalicylate (PEPTO BISMOL) 262 MG/15ML suspension Take 30 mLs by mouth every 6 (six) hours as needed (as needed).    . carvedilol (COREG) 25 MG tablet TAKE 1 TABLET BY MOUTH TWICE DAILY WITH MEALS 60 tablet 9  . carvedilol (COREG) 25 MG tablet Take 1 tablet (25 mg total) by mouth 2 (two) times daily with a meal. 180 tablet 3  . escitalopram (LEXAPRO) 20 MG tablet Take 1 tablet (20 mg total) by mouth at bedtime. 30 tablet 5  . ferrous sulfate 325 (65 FE) MG tablet Take 1 tablet (325 mg total) by mouth 2 (two) times daily with a meal. 60 tablet 2  . furosemide (LASIX) 40 MG tablet Take 1 tablet (40 mg total) by mouth daily. 90 tablet 1  . gabapentin (NEURONTIN) 100 MG capsule Take 1 capsule (100 mg total) by mouth at bedtime. 90 capsule 3  . glipiZIDE (GLUCOTROL) 10 MG tablet TAKE 1 TABLET BY MOUTH 2 TIMES  DAILY BEFORE A MEAL. 180 tablet 3  . glucose blood (TRUE METRIX BLOOD GLUCOSE TEST) test strip Use as instructed 100 each 12  . hydrALAZINE (APRESOLINE) 25 MG tablet Take 1 tablet (25 mg total) by mouth every 8 (eight) hours. Dose increase 90 tablet 5  . insulin aspart (NOVOLOG) 100 UNIT/ML injection 3 units before meals. 60 mL 3  . Insulin Glargine (LANTUS SOLOSTAR) 100 UNIT/ML Solostar Pen Inject 30 Units into the skin 2 (two) times daily. 90 mL 3  . Insulin Pen Needle 29G X 12.7MM MISC 10 Units by Does not apply route 1 day or 1 dose. 100 each 11  . Insulin Syringe-Needle U-100 30G X 5/16" 1 ML MISC Test 3 times daily 100 each 11  . isosorbide mononitrate (IMDUR) 60 MG 24 hr tablet Take 1 tablet (60 mg total) by mouth daily. 90 tablet 1  . TRUEPLUS LANCETS 28G MISC Use as directed 100 each 12  . Na Sulfate-K Sulfate-Mg  Sulf 17.5-3.13-1.6 GM/180ML SOLN Take 1 kit by mouth once. 354 mL 0   No current facility-administered medications for this visit.       ___________________________________________________________________ Objective   Exam:  BP (!) 184/86   Pulse 80   Ht 5' 5"  (1.651 m)   Wt 175 lb 4 oz (79.5 kg)   BMI 29.16 kg/m    General: this is a(n) Pleasant woman, ambulatory and not acutely ill-appearing   Eyes: sclera anicteric, no redness  ENT: oral mucosa moist without lesions, no cervical or supraclavicular lymphadenopathy, good dentition  CV: RRR without murmur, S1/S2, no JVD, no peripheral edema  Resp: clear to auscultation bilaterally, normal RR and effort noted  GI: soft, no tenderness, with active bowel sounds. No guarding or palpable organomegaly noted.  Skin; warm and dry, no rash or jaundice noted  Neuro: awake, alert and oriented x 3. Normal gross motor function and fluent speech  Labs:  05/10/17, Hgb 8.7 01/30/17  WBC 6.9,  Hgb 8.2  MCV 82  plt 241 In Feb Hgb 7.5  BMP Latest Ref Rng & Units 01/30/2017 12/25/2016 12/11/2016  Glucose 65 - 99 mg/dL  199(H) 143(H) 127(H)  BUN 6 - 20 mg/dL 60(H) 57(H) 49(H)  Creatinine 0.44 - 1.00 mg/dL 2.75(H) 2.22(H) 2.52(H)  Sodium 135 - 145 mmol/L 137 137 139  Potassium 3.5 - 5.1 mmol/L 5.0 4.9 4.9  Chloride 101 - 111 mmol/L 107 106 106  CO2 22 - 32 mmol/L 21(L) 24 26  Calcium 8.9 - 10.3 mg/dL 8.7(L) 8.1(L) 8.2(L)   Hgb A1C 8.4 on 12/01/16  (10.1 a month prior, and 13.7 in 07/2016)  Radiologic Studies:  Her nuclear medicine test from last month reveals no ischemic changes, diffuse hypokinesis, and ejection fraction 40%  Assessment: Encounter Diagnoses  Name Primary?  . Iron deficiency anemia, unspecified iron deficiency anemia type Yes  . Bloating   . Chronic systolic congestive heart failure ([place])   . Type 2 diabetes mellitus with complication, with long-term current use of insulin ([place])   . CKD (chronic kidney disease) stage 4, GFR 15-29 ml/min ([facility])     Iron deficiency anemia seems most likely to be from chronic kidney disease. Endoscopic workup needed to rule out a source of occult GI blood loss.  Plan:  EGD and colonoscopy  The benefits and risks of the planned procedure were described in detail with the patient or (when appropriate) their health care proxy.  Risks were outlined as including, but not limited to, bleeding, infection, perforation, adverse medication reaction leading to cardiac or pulmonary decompensation, or pancreatitis (if ERCP).  The limitation of incomplete mucosal visualization was also discussed.  No guarantees or warranties were given.  Patient at increased risk for cardiopulmonary complications of procedure due to medical comorbidities. Since her EF has improved from 6 months ago, she meets criteria to have her procedure performed in our endoscopy lab.  Thank you for the courtesy of this consult.  Please call me with any questions or concerns.  Nelida Meuse III  CC: Ladell Pier, MD  Dr Orene Desanctis, Cardiology Dr. Rosamaria Lints, [facility]

## 2017-05-27 NOTE — Patient Instructions (Signed)
If you are age 59 or older, your body mass index should be between 23-30. Your Body mass index is 29.16 kg/m. If this is out of the aforementioned range listed, please consider follow up with your Primary Care Provider.  If you are age 32 or younger, your body mass index should be between 19-25. Your Body mass index is 29.16 kg/m. If this is out of the aformentioned range listed, please consider follow up with your Primary Care Provider.   You have been scheduled for an endoscopy and colonoscopy. Please follow the written instructions given to you at your visit today. Please pick up your prep supplies at the pharmacy within the next 1-3 days. If you use inhalers (even only as needed), please bring them with you on the day of your procedure. Your physician has requested that you go to www.startemmi.com and enter the access code given to you at your visit today. This web site gives a general overview about your procedure. However, you should still follow specific instructions given to you by our office regarding your preparation for the procedure.  Thank you for choosing [facility]  Dr Wilfrid Lund III

## 2017-06-01 MED FILL — ISOSORBIDE MN ER 60 MG TAB: 60 | 30 days supply | Qty: 30 | Fill #2

## 2017-06-01 MED FILL — ?LISINOPRIL 5 MG TABLET: 5 | 30 days supply | Qty: 15 | Fill #4

## 2017-06-01 MED FILL — FERROUS SULFATE 325 MG TAB: 325 (65 FE) | 30 days supply | Qty: 60 | Fill #1

## 2017-06-03 ENCOUNTER — Ambulatory Visit (AMBULATORY_SURGERY_CENTER): Payer: Medicaid Other | Admitting: Gastroenterology

## 2017-06-03 ENCOUNTER — Encounter: Payer: Self-pay | Admitting: Gastroenterology

## 2017-06-03 VITALS — BP 166/83 | HR 68 | Temp 96.8°F | Resp 14 | Ht 65.0 in | Wt 175.0 lb

## 2017-06-03 DIAGNOSIS — K2950 Unspecified chronic gastritis without bleeding: Secondary | ICD-10-CM | POA: Diagnosis not present

## 2017-06-03 DIAGNOSIS — D509 Iron deficiency anemia, unspecified: Secondary | ICD-10-CM | POA: Diagnosis not present

## 2017-06-03 MED ORDER — SODIUM CHLORIDE 0.9 % IV SOLN: 500.0000 mL | INTRAVENOUS | Status: DC

## 2017-06-03 NOTE — Progress Notes (Signed)
Report given to PACU, vss 

## 2017-06-03 NOTE — Patient Instructions (Signed)
YOU HAD AN ENDOSCOPIC PROCEDURE TODAY AT THE [facility]:   Refer to the procedure report that was given to you for any specific questions about what was found during the examination.  If the procedure report does not answer your questions, please call your gastroenterologist to clarify.  If you requested that your care partner not be given the details of your procedure findings, then the procedure report has been included in a sealed envelope for you to review at your convenience later.  YOU SHOULD EXPECT: Some feelings of bloating in the abdomen. Passage of more gas than usual.  Walking can help get rid of the air that was put into your GI tract during the procedure and reduce the bloating. If you had a lower endoscopy (such as a colonoscopy or flexible sigmoidoscopy) you may notice spotting of blood in your stool or on the toilet paper. If you underwent a bowel prep for your procedure, you may not have a normal bowel movement for a few days.  Please Note:  You might notice some irritation and congestion in your nose or some drainage.  This is from the oxygen used during your procedure.  There is no need for concern and it should clear up in a day or so.  SYMPTOMS TO REPORT IMMEDIATELY:   Following lower endoscopy (colonoscopy or flexible sigmoidoscopy):  Excessive amounts of blood in the stool  Significant tenderness or worsening of abdominal pains  Swelling of the abdomen that is new, acute  Fever of 100F or higher   Following upper endoscopy (EGD)  Vomiting of blood or coffee ground material  New chest pain or pain under the shoulder blades  Painful or persistently difficult swallowing  New shortness of breath  Fever of 100F or higher  Black, tarry-looking stools  For urgent or emergent issues, a gastroenterologist can be reached at any hour by calling [phone].   DIET:  We do recommend a small meal at first, but then you may proceed to your regular diet.  Drink  plenty of fluids but you should avoid alcoholic beverages for 24 hours.  ACTIVITY:  You should plan to take it easy for the rest of today and you should NOT DRIVE or use heavy machinery until tomorrow (because of the sedation medicines used during the test).    FOLLOW UP: Our staff will call the number listed on your records the next business day following your procedure to check on you and address any questions or concerns that you may have regarding the information given to you following your procedure. If we do not reach you, we will leave a message.  However, if you are feeling well and you are not experiencing any problems, there is no need to return our call.  We will assume that you have returned to your regular daily activities without incident.  If any biopsies were taken you will be contacted by phone or by letter within the next 1-3 weeks.  Please call us at [phone] if you have not heard about the biopsies in 3 weeks.    SIGNATURES/CONFIDENTIALITY: You and/or your care partner have signed paperwork which will be entered into your electronic medical record.  These signatures attest to the fact that that the information above on your After Visit Summary has been reviewed and is understood.  Full responsibility of the confidentiality of this discharge information lies with you and/or your care-partner.  Read all handouts given to you by your recovery room nurse. 

## 2017-06-03 NOTE — Progress Notes (Signed)
Called to room to assist during endoscopic procedure.  Patient ID and intended procedure confirmed with present staff. Received instructions for my participation in the procedure from the performing physician.  

## 2017-06-03 NOTE — Op Note (Signed)
[place] Patient Name: Patricia [name] Procedure Date: 06/03/2017 8:34 AM MRN: [id number] Endoscopist: Mallie Mussel L. [name] , MD Age: 61 Referring MD:  Date of Birth: 07-May-1956 Gender: Female Account #: [id number] Procedure:                Colonoscopy Indications:              Iron deficiency anemia Medicines:                Monitored Anesthesia Care Procedure:                Pre-Anesthesia Assessment:                           - Prior to the procedure, a History and Physical                            was performed, and patient medications and                            allergies were reviewed. The patient's tolerance of                            previous anesthesia was also reviewed. The risks                            and benefits of the procedure and the sedation                            options and risks were discussed with the patient.                            All questions were answered, and informed consent                            was obtained. Anticoagulants: The patient has taken                            aspirin. It was decided not to withhold this                            medication prior to the procedure. ASA Grade                            Assessment: III - A patient with severe systemic                            disease. After reviewing the risks and benefits,                            the patient was deemed in satisfactory condition to                            undergo the procedure.  After obtaining informed consent, the colonoscope                            was passed under direct vision. Throughout the                            procedure, the patient's blood pressure, pulse, and                            oxygen saturations were monitored continuously. The                            Colonoscope was introduced through the anus and                            advanced to the the cecum, identified by       appendiceal orifice and ileocecal valve. The                            colonoscopy was performed without difficulty. The                            patient tolerated the procedure well. The quality                            of the bowel preparation was good. The ileocecal                            valve, appendiceal orifice, and rectum were                            photographed. The quality of the bowel preparation                            was evaluated using the BBPS [facility] Bowel                            Preparation Scale) with scores of: Right Colon = 2,                            Transverse Colon = 2 and Left Colon = 2. The total                            BBPS score equals 6. The bowel preparation used was                            SUPREP. Scope In: 8:45:42 AM Scope Out: 8:59:28 AM Scope Withdrawal Time: 0 hours 9 minutes 20 seconds  Total Procedure Duration: 0 hours 13 minutes 46 seconds  Findings:                 The digital rectal exam findings include decreased  sphincter tone.                           Diverticula were found in the left colon.                           Retroflexion in the rectum was not performed due to                            anatomy.                           The exam was otherwise without abnormality. Complications:            No immediate complications. Estimated Blood Loss:     Estimated blood loss: none. Impression:               - Decreased sphincter tone found on digital rectal                            exam.                           - Diverticulosis in the left colon.                           - The examination was otherwise normal.                           - No specimens collected. Recommendation:           - Patient has a contact number available for                            emergencies. The signs and symptoms of potential                            delayed complications were discussed with the                             patient. Return to normal activities tomorrow.                            Written discharge instructions were provided to the                            patient.                           - Resume previous diet.                           - Continue present medications.                           - Repeat colonoscopy in 10 years for screening  purposes.  L. [name], MD 06/03/2017 9:09:56 AM This report has been signed electronically. CC Letter to:             Albertine Patricia, MD ([place])                           Pearson Grippe, MD [facility])

## 2017-06-03 NOTE — Op Note (Addendum)
[name] Procedure Date: 06/03/2017 8:35 AM MRN: [id number] Endoscopist: Mallie Mussel L. Patricia [name] , MD Age: 61 Referring MD:  Date of Birth: 01/22/1956 Gender: Female Account #: [id number] Procedure:                Upper GI endoscopy Indications:              Iron deficiency anemia Medicines:                Monitored Anesthesia Care Procedure:                Pre-Anesthesia Assessment:                           - Prior to the procedure, a History and Physical                            was performed, and patient medications and                            allergies were reviewed. The patient's tolerance of                            previous anesthesia was also reviewed. The risks                            and benefits of the procedure and the sedation                            options and risks were discussed with the patient.                            All questions were answered, and informed consent                            was obtained. Anticoagulants: The patient has taken                            aspirin. It was decided not to withhold this                            medication prior to the procedure. ASA Grade                            Assessment: III - A patient with severe systemic                            disease. After reviewing the risks and benefits,                            the patient was deemed in satisfactory condition to                            undergo the procedure.  After obtaining informed consent, the endoscope was                            passed under direct vision. Throughout the                            procedure, the patient's blood pressure, pulse, and                            oxygen saturations were monitored continuously. The                            Endoscope was introduced through the mouth, and                            advanced to the second part of duodenum. The upper                      GI endoscopy was accomplished without difficulty.                            The patient tolerated the procedure well. Scope In: Scope Out: Findings:                 The larynx was normal.                           The esophagus was normal.                           Patchy mild inflammation characterized by adherent                            blood, congestion (edema) and erythema was found in                            the gastric antrum. Biopsies were taken with a cold                            forceps for histology. ([facility]ney protocol).                           The cardia and gastric fundus were normal on                            retroflexion.                           The examined duodenum was normal. Complications:            No immediate complications.D Estimated Blood Loss:     Estimated blood loss was minimal. Impression:               - Normal larynx.                           - Normal esophagus.                           -  Gastritis. Biopsied.                           - Normal examined duodenum. Recommendation:           - Patient has a contact number available for                            emergencies. The signs and symptoms of potential                            delayed complications were discussed with the                            patient. Return to normal activities tomorrow.                            Written discharge instructions were provided to the                            patient.                           - Resume previous diet.                           - Continue present medications.                           - Await pathology results. If H. pylori negative,                            then this gastritis is due to aspirin and is not                            the cause of iron deficiency anemia.                           The patient's IDA is most likely due to chronic                            kidney disease.                            - See the other procedure note for documentation of                            additional recommendations. Henry L. Patricia Carrow, MD 06/03/2017 9:06:58 AM This report has been signed electronically. CC Letter to:             Dr. Dani Gobble Croituru ([place])                           Dr, Pearson Grippe [facility])

## 2017-06-04 ENCOUNTER — Other Ambulatory Visit (HOSPITAL_COMMUNITY): Payer: Self-pay | Admitting: *Deleted

## 2017-06-04 ENCOUNTER — Telehealth: Payer: Self-pay | Admitting: *Deleted

## 2017-06-04 NOTE — Telephone Encounter (Signed)
  Follow up Call-  Call back number 06/03/2017  Post procedure Call Back phone  # [phone]  Permission to leave phone message Yes  Some recent data might be hidden     Patient questions:  Do you have a fever, pain , or abdominal swelling? No. Pain Score  0 *  Have you tolerated food without any problems? Yes.    Have you been able to return to your normal activities? Yes.    Do you have any questions about your discharge instructions: Diet   No. Medications  No. Follow up visit  No.  Do you have questions or concerns about your Care? No.  Actions: * If pain score is 4 or above: No action needed, pain <4.

## 2017-06-07 ENCOUNTER — Encounter (HOSPITAL_COMMUNITY)
Admission: RE | Admit: 2017-06-07 | Discharge: 2017-06-07 | Disposition: A | Discharge status: Home / Self Care | Payer: Medicaid Other | Source: Ambulatory Visit | Attending: Nephrology | Admitting: Nephrology

## 2017-06-07 DIAGNOSIS — N184 Chronic kidney disease, stage 4 (severe): Secondary | ICD-10-CM

## 2017-06-07 DIAGNOSIS — D631 Anemia in chronic kidney disease: Secondary | ICD-10-CM | POA: Insufficient documentation

## 2017-06-07 LAB — POCT HEMOGLOBIN-HEMACUE: Hemoglobin: 9.2 g/dL — ABNORMAL LOW (ref 12.0–15.0)

## 2017-06-07 LAB — FERRITIN: Ferritin: 46 ng/mL (ref 11–307)

## 2017-06-07 LAB — IRON AND TIBC
Iron: 47 ug/dL (ref 28–170)
SATURATION RATIOS: 15 % (ref 10.4–31.8)
TIBC: 314 ug/dL (ref 250–450)
UIBC: 267 ug/dL

## 2017-06-07 LAB — RENAL FUNCTION PANEL
ALBUMIN: 2.5 g/dL — AB (ref 3.5–5.0)
Anion gap: 8 (ref 5–15)
BUN: 50 mg/dL — AB (ref 6–20)
CALCIUM: 8.2 mg/dL — AB (ref 8.9–10.3)
CO2: 23 mmol/L (ref 22–32)
CREATININE: 3.17 mg/dL — AB (ref 0.44–1.00)
Chloride: 106 mmol/L (ref 101–111)
GFR calc Af Amer: 17 mL/min — ABNORMAL LOW (ref >60)
GFR calc non Af Amer: 15 mL/min — ABNORMAL LOW (ref >60)
GLUCOSE: 214 mg/dL — AB (ref 65–99)
PHOSPHORUS: 5.1 mg/dL — AB (ref 2.5–4.6)
POTASSIUM: 4.6 mmol/L (ref 3.5–5.1)
SODIUM: 137 mmol/L (ref 135–145)

## 2017-06-07 MED ORDER — DARBEPOETIN ALFA 150 MCG/0.3ML IJ SOSY: 150.0000 ug | PREFILLED_SYRINGE | INTRAMUSCULAR | Status: DC

## 2017-06-08 ENCOUNTER — Other Ambulatory Visit (HOSPITAL_COMMUNITY): Payer: Self-pay | Admitting: *Deleted

## 2017-06-10 ENCOUNTER — Ambulatory Visit: Payer: Medicaid Other | Attending: Internal Medicine | Admitting: Internal Medicine

## 2017-06-10 ENCOUNTER — Encounter (HOSPITAL_COMMUNITY)
Admission: RE | Admit: 2017-06-10 | Discharge: 2017-06-10 | Disposition: A | Discharge status: Home / Self Care | Payer: Medicaid Other | Source: Ambulatory Visit | Attending: Nephrology | Admitting: Nephrology

## 2017-06-10 ENCOUNTER — Encounter: Payer: Self-pay | Admitting: Internal Medicine

## 2017-06-10 VITALS — BP 174/79 | HR 75 | Temp 98.0°F | Resp 16 | Wt 179.6 lb

## 2017-06-10 DIAGNOSIS — Z9889 Other specified postprocedural states: Secondary | ICD-10-CM | POA: Diagnosis not present

## 2017-06-10 DIAGNOSIS — N184 Chronic kidney disease, stage 4 (severe): Secondary | ICD-10-CM

## 2017-06-10 DIAGNOSIS — Z1231 Encounter for screening mammogram for malignant neoplasm of breast: Secondary | ICD-10-CM | POA: Diagnosis not present

## 2017-06-10 DIAGNOSIS — I5022 Chronic systolic (congestive) heart failure: Secondary | ICD-10-CM | POA: Diagnosis not present

## 2017-06-10 DIAGNOSIS — Z794 Long term (current) use of insulin: Secondary | ICD-10-CM | POA: Diagnosis not present

## 2017-06-10 DIAGNOSIS — Z882 Allergy status to sulfonamides status: Secondary | ICD-10-CM | POA: Insufficient documentation

## 2017-06-10 DIAGNOSIS — E1122 Type 2 diabetes mellitus with diabetic chronic kidney disease: Secondary | ICD-10-CM | POA: Insufficient documentation

## 2017-06-10 DIAGNOSIS — F329 Major depressive disorder, single episode, unspecified: Secondary | ICD-10-CM | POA: Diagnosis not present

## 2017-06-10 DIAGNOSIS — Z7982 Long term (current) use of aspirin: Secondary | ICD-10-CM | POA: Diagnosis not present

## 2017-06-10 DIAGNOSIS — Z90710 Acquired absence of both cervix and uterus: Secondary | ICD-10-CM | POA: Insufficient documentation

## 2017-06-10 DIAGNOSIS — Z800 Family history of malignant neoplasm of digestive organs: Secondary | ICD-10-CM | POA: Diagnosis not present

## 2017-06-10 DIAGNOSIS — I10 Essential (primary) hypertension: Secondary | ICD-10-CM

## 2017-06-10 DIAGNOSIS — I429 Cardiomyopathy, unspecified: Secondary | ICD-10-CM | POA: Insufficient documentation

## 2017-06-10 DIAGNOSIS — Z79899 Other long term (current) drug therapy: Secondary | ICD-10-CM | POA: Diagnosis not present

## 2017-06-10 DIAGNOSIS — Z833 Family history of diabetes mellitus: Secondary | ICD-10-CM | POA: Insufficient documentation

## 2017-06-10 DIAGNOSIS — E113319 Type 2 diabetes mellitus with moderate nonproliferative diabetic retinopathy with macular edema, unspecified eye: Secondary | ICD-10-CM | POA: Insufficient documentation

## 2017-06-10 DIAGNOSIS — Z888 Allergy status to other drugs, medicaments and biological substances status: Secondary | ICD-10-CM | POA: Insufficient documentation

## 2017-06-10 DIAGNOSIS — R740 Nonspecific elevation of levels of transaminase and lactic acid dehydrogenase [LDH]: Secondary | ICD-10-CM | POA: Insufficient documentation

## 2017-06-10 DIAGNOSIS — Z8051 Family history of malignant neoplasm of kidney: Secondary | ICD-10-CM | POA: Insufficient documentation

## 2017-06-10 DIAGNOSIS — I130 Hypertensive heart and chronic kidney disease with heart failure and stage 1 through stage 4 chronic kidney disease, or unspecified chronic kidney disease: Secondary | ICD-10-CM | POA: Insufficient documentation

## 2017-06-10 DIAGNOSIS — K029 Dental caries, unspecified: Secondary | ICD-10-CM | POA: Insufficient documentation

## 2017-06-10 DIAGNOSIS — E785 Hyperlipidemia, unspecified: Secondary | ICD-10-CM | POA: Insufficient documentation

## 2017-06-10 DIAGNOSIS — Z8249 Family history of ischemic heart disease and other diseases of the circulatory system: Secondary | ICD-10-CM | POA: Insufficient documentation

## 2017-06-10 DIAGNOSIS — F419 Anxiety disorder, unspecified: Secondary | ICD-10-CM | POA: Insufficient documentation

## 2017-06-10 DIAGNOSIS — D509 Iron deficiency anemia, unspecified: Secondary | ICD-10-CM

## 2017-06-10 DIAGNOSIS — E1121 Type 2 diabetes mellitus with diabetic nephropathy: Secondary | ICD-10-CM | POA: Insufficient documentation

## 2017-06-10 DIAGNOSIS — E118 Type 2 diabetes mellitus with unspecified complications: Secondary | ICD-10-CM

## 2017-06-10 DIAGNOSIS — K5730 Diverticulosis of large intestine without perforation or abscess without bleeding: Secondary | ICD-10-CM | POA: Insufficient documentation

## 2017-06-10 DIAGNOSIS — Z1239 Encounter for other screening for malignant neoplasm of breast: Secondary | ICD-10-CM

## 2017-06-10 DIAGNOSIS — D631 Anemia in chronic kidney disease: Secondary | ICD-10-CM | POA: Diagnosis not present

## 2017-06-10 LAB — POCT GLYCOSYLATED HEMOGLOBIN (HGB A1C): HEMOGLOBIN A1C: 8.9

## 2017-06-10 LAB — GLUCOSE, POCT (MANUAL RESULT ENTRY): POC GLUCOSE: 132 mg/dL — AB (ref 70–99)

## 2017-06-10 LAB — POCT HEMOGLOBIN-HEMACUE: HEMOGLOBIN: 8.5 g/dL — AB (ref 12.0–15.0)

## 2017-06-10 MED ORDER — DARBEPOETIN ALFA 150 MCG/0.3ML IJ SOSY
PREFILLED_SYRINGE | INTRAMUSCULAR | Status: AC
  Filled 2017-06-10: qty 0.3

## 2017-06-10 MED ORDER — SODIUM CHLORIDE 0.9 % IV SOLN
510.0000 mg | INTRAVENOUS | Status: DC
  Administered 2017-06-10: 510 mg via INTRAVENOUS
  Filled 2017-06-10: qty 17

## 2017-06-10 MED ORDER — HYDRALAZINE HCL 50 MG PO TABS: 50.0000 mg | ORAL_TABLET | Freq: Three times a day (TID) | ORAL | 3 refills | Status: DC

## 2017-06-10 MED ORDER — DARBEPOETIN ALFA 150 MCG/0.3ML IJ SOSY
150.0000 ug | PREFILLED_SYRINGE | INTRAMUSCULAR | Status: DC
  Administered 2017-06-10: 09:00:00 150 ug via SUBCUTANEOUS

## 2017-06-10 NOTE — Discharge Instructions (Signed)

## 2017-06-10 NOTE — Progress Notes (Signed)
Patient ID: Patricia [name], female    DOB: May 12, 1956  MRN: [id number]  CC: Hypertension and Diabetes   Subjective: Patricia [name] is a 61 y.o. female who presents for UC. Daughter, Patricia [name], is with her.  Her concerns today include:  Patient with history of diabetes type 2 with retinopathy,neuropathyand nephropathy (CKD stage 4),systolic CHF with EF improved from  25-30% to 40% as of 04/2017 possible Takotsubo variant, mixed anemia ACDand IDA, HL and depression.  She presents for short term f/u on BP 1. HTN: Hydralazine increased on last visit 3 wks ago Also taking 2.5 mg Lisinopril which neph told her to continue taking -checking BP BID  This a.m 134/66, yesterday  SBP 166.  Evenings SBP in the 170s  -HA when BP high.  No sob  2. Anemia: completed colonoscopy and EGD -colonoscopy revealed diverticuli in LT colon and EGD showed mild gastritis  3. Checks BS BID. A.m range 90s and bedtime 169-170 -takes Lantus 30 units BID, glucotrol 10 mg. Not taking Novolog "because it drops my BS too low." States she was 10 units previously from hosp and it was too much  HM: wants to hold on getting flu shot today. Due for MMG  Patient Active Problem List   Diagnosis Date Noted  . Systolic CHF ([place]) 37/34/2876  . Diabetes mellitus type 2 with complications ([place]) 81/15/7262  . Transaminasemia 11/29/2016  . Cardiomyopathy ([place]) 11/12/2016  . Lipoma 10/21/2016  . CKD (chronic kidney disease) stage 4, GFR 15-29 ml/min (HCC) 08/10/2016  . Anxiety and depression 12/12/2015  . Diabetic nephropathy associated with type 2 diabetes mellitus ([place]) 11/15/2014  . Moderate nonproliferative diabetic retinopathy(362.05) 04/17/2014  . 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 Essential hypertension 03/28/2014  . Dental caries 03/28/2014  . Dyslipidemia 04/18/2013     Current Outpatient Prescriptions on File Prior to Visit  Medication Sig Dispense Refill  . aspirin EC 81 MG tablet Take 1 tablet (81 mg  total) by mouth daily. 90 tablet 3  . atorvastatin (LIPITOR) 20 MG tablet Take 0.5 tablets (10 mg total) by mouth daily. 90 tablet 3  . bismuth subsalicylate (PEPTO BISMOL) 262 MG/15ML suspension Take 30 mLs by mouth every 6 (six) hours as needed (as needed).    . carvedilol (COREG) 25 MG tablet TAKE 1 TABLET BY MOUTH TWICE DAILY WITH MEALS 60 tablet 9  . carvedilol (COREG) 25 MG tablet Take 1 tablet (25 mg total) by mouth 2 (two) times daily with a meal. 180 tablet 3  . escitalopram (LEXAPRO) 20 MG tablet Take 1 tablet (20 mg total) by mouth at bedtime. 30 tablet 5  . ferrous sulfate 325 (65 FE) MG tablet Take 1 tablet (325 mg total) by mouth 2 (two) times daily with a meal. 60 tablet 2  . furosemide (LASIX) 40 MG tablet Take 1 tablet (40 mg total) by mouth daily. 90 tablet 1  . gabapentin (NEURONTIN) 100 MG capsule Take 1 capsule (100 mg total) by mouth at bedtime. 90 capsule 3  . glipiZIDE (GLUCOTROL) 10 MG tablet TAKE 1 TABLET BY MOUTH 2 TIMES DAILY BEFORE A MEAL. 180 tablet 3  . glucose blood (TRUE METRIX BLOOD GLUCOSE TEST) test strip Use as instructed 100 each 12  . insulin aspart (NOVOLOG) 100 UNIT/ML injection 3 units before meals. 60 mL 3  . Insulin Glargine (LANTUS SOLOSTAR) 100 UNIT/ML Solostar Pen Inject 30 Units into the skin 2 (two) times daily. 90 mL 3  . Insulin Pen Needle 29G X  12.7MM MISC 10 Units by Does not apply route 1 day or 1 dose. 100 each 11  . Insulin Syringe-Needle U-100 30G X 5/16" 1 ML MISC Test 3 times daily 100 each 11  . isosorbide mononitrate (IMDUR) 60 MG 24 hr tablet Take 1 tablet (60 mg total) by mouth daily. 90 tablet 1  . TRUEPLUS LANCETS 28G MISC Use as directed 100 each 12  . [DISCONTINUED] quinapril (ACCUPRIL) 5 MG tablet Take by mouth at bedtime.     Current Facility-Administered Medications on File Prior to Visit  Medication Dose Route Frequency Provider Last Rate Last Dose  . 0.9 %  sodium chloride infusion  500 mL Intravenous Continuous Doran Stabler, MD        Allergies  Allergen Reactions  . Codone [Hydrocodone] Itching  . Norvasc [Amlodipine Besylate] Swelling    Lower extremity?  . Sulfa Antibiotics Itching and Rash    Social History   Social History  . Marital status: Divorced    Spouse name: N/A  . Number of children: 2  . Years of education: N/A   Occupational History  . disabled    Social History Main Topics  . Smoking status: Never Smoker  . Smokeless tobacco: Never Used  . Alcohol use No  . Drug use: No  . Sexual activity: Not Currently    Birth control/ protection: Surgical   Other Topics Concern  . Not on file   Social History Narrative  . No narrative on file    Family History  Problem Relation Age of Onset  . Diabetes Brother   . Stomach cancer Brother   . Kidney disease Brother   . Heart Problems Maternal Grandmother        pacemaker  . Heart disease Maternal Grandfather   . Rectal cancer Neg Hx   . Esophageal cancer Neg Hx   . Liver cancer Neg Hx   . Colon cancer Neg Hx     Past Surgical History:  Procedure Laterality Date  . ABDOMINAL HYSTERECTOMY    . BREAST SURGERY     reduction  . CESAREAN SECTION  1982, 1988    ROS: Review of Systems Neg except as above PHYSICAL EXAM: BP (!) 174/79   Pulse 75   Temp 98 F (36.7 C) (Oral)   Resp 16   Wt 179 lb 9.6 oz (81.5 kg)   SpO2 96%   BMI 28.99 kg/m   148/80 Physical Exam General appearance - alert, well appearing, and in no distress Mental status - alert, oriented to person, place, and time, normal mood, behavior, speech, dress, motor activity, and thought processes   Results for orders placed or performed in visit on 06/10/17  POCT glycosylated hemoglobin (Hb A1C)  Result Value Ref Range   Hemoglobin A1C 8.9   POCT glucose (manual entry)  Result Value Ref Range   POC Glucose 132 (A) 70 - 99 mg/dl    ASSESSMENT AND PLAN: 1. Essential hypertension Not at goal. Increase hydralazine to 50 mg 3 times a  day -Follow up with clinical pharmacists in 3 weeks for recheck. Advised to keep log of blood pressure readings and bring them in on that visit - hydrALAZINE (APRESOLINE) 50 MG tablet; Take 1 tablet (50 mg total) by mouth 3 (three) times daily.  Dispense: 90 tablet; Refill: 3  2. Type 2 diabetes mellitus with complication, with long-term current use of insulin (HCC) -A1c increased  -Advised to take NovoLog 2-3 units with her 2  largest meals of the day which she states is breakfast and dinner. Keep blood sugar log and bring it in in 3 weeks when she sees clinical pharmacists. - POCT glycosylated hemoglobin (Hb A1C) - POCT glucose (manual entry)  3. Iron deficiency anemia, unspecified iron deficiency anemia type On iron and iron [place]  4. Breast cancer screening - MM Digital Screening; Future  HM: She declines flu shot today stating that she will get it on her next visit Patient was given the opportunity to ask questions.  Patient verbalized understanding of the plan and was able to repeat key elements of the plan.   Orders Placed This Encounter  Procedures  . MM Digital Screening  . POCT glycosylated hemoglobin (Hb A1C)  . POCT glucose (manual entry)     Requested Prescriptions   Signed Prescriptions Disp Refills  . hydrALAZINE (APRESOLINE) 50 MG tablet 90 tablet 3    Sig: Take 1 tablet (50 mg total) by mouth 3 (three) times daily.    Return in about 3 months (around 09/10/2017).  Karle Plumber, MD, FACP

## 2017-06-10 NOTE — Patient Instructions (Signed)
Give appointment with [facility] in 3 weeks for recheck BP and blood sugars.  Increase Hydralazine to 50 mg three times a day. Take Novolog insulin 2-3 units with the two largest meals of the day.

## 2017-06-17 ENCOUNTER — Encounter (HOSPITAL_COMMUNITY)
Admission: RE | Admit: 2017-06-17 | Discharge: 2017-06-17 | Disposition: A | Discharge status: Home / Self Care | Payer: Medicaid Other | Source: Ambulatory Visit | Attending: Nephrology | Admitting: Nephrology

## 2017-06-17 DIAGNOSIS — D631 Anemia in chronic kidney disease: Secondary | ICD-10-CM | POA: Diagnosis not present

## 2017-06-17 MED ORDER — SODIUM CHLORIDE 0.9 % IV SOLN
510.0000 mg | INTRAVENOUS | Status: AC
  Administered 2017-06-17: 510 mg via INTRAVENOUS
  Filled 2017-06-17: qty 17

## 2017-06-18 ENCOUNTER — Telehealth: Payer: Self-pay | Admitting: Cardiovascular Disease

## 2017-06-18 NOTE — Telephone Encounter (Signed)
06/18/2017  Received faxed referral from [facility] for upcoming appointment with Dr. Sallyanne Kuster on 07/06/2017 @ 1:20.  Records given to [facility]. cbr

## 2017-06-21 MED FILL — GABAPENTIN 100 MG CAP: 100 | 30 days supply | Qty: 30 | Fill #6

## 2017-06-21 MED FILL — ESCITALOPRAM 20 MG TABLET: 20 | 30 days supply | Qty: 30 | Fill #1

## 2017-06-22 MED FILL — glipiZIDE 10 MG TABS: 10 | 30 days supply | Qty: 60 | Fill #5

## 2017-06-22 MED FILL — ATORVASTATIN 20 MG TABLET: 20 | 30 days supply | Qty: 15 | Fill #5

## 2017-06-22 MED FILL — hydrALAZINE HCL 50 MG TABS: 50 | 30 days supply | Qty: 90 | Fill #0

## 2017-06-22 MED FILL — LANTUS SOLOSTAR 100 UNITS/M: 100 | 25 days supply | Qty: 15 | Fill #2

## 2017-06-29 ENCOUNTER — Ambulatory Visit: Payer: Self-pay | Admitting: Pharmacist

## 2017-06-30 ENCOUNTER — Ambulatory Visit: Payer: Medicaid Other | Attending: Internal Medicine | Admitting: Pharmacist

## 2017-06-30 VITALS — BP 174/82 | HR 74

## 2017-06-30 DIAGNOSIS — Z794 Long term (current) use of insulin: Secondary | ICD-10-CM | POA: Diagnosis not present

## 2017-06-30 DIAGNOSIS — Z79899 Other long term (current) drug therapy: Secondary | ICD-10-CM | POA: Diagnosis not present

## 2017-06-30 DIAGNOSIS — I10 Essential (primary) hypertension: Secondary | ICD-10-CM | POA: Diagnosis present

## 2017-06-30 DIAGNOSIS — E162 Hypoglycemia, unspecified: Secondary | ICD-10-CM | POA: Insufficient documentation

## 2017-06-30 DIAGNOSIS — E1165 Type 2 diabetes mellitus with hyperglycemia: Secondary | ICD-10-CM | POA: Diagnosis present

## 2017-06-30 DIAGNOSIS — E118 Type 2 diabetes mellitus with unspecified complications: Secondary | ICD-10-CM

## 2017-06-30 NOTE — Progress Notes (Signed)
    S:     Chief Complaint  Patient presents with  . Medication Management    Patient arrives in good spirits.  Presents for diabetes evaluation, education, and management at the request of Dr. Wynetta Emery. Patient was referred on 06/10/17.  Patient was last seen by Primary Care Provider on 06/10/17.   Patient reports adherence with medications.  Current diabetes medications include: Novolog 3 units before meals, Lantus 30 units, and glipizide 10 mg BID. She only takes the Novolog when she feels like her blood sugar isn't too low.  Current hypertension medications include: carvedilol 25 mg BID, furosemide 40 mg daily, hydralazine 50 mg TID, isosorbide mononitrate 60 mg daily.   Patient reports hypoglycemic events. She reports waking up to a 60 one day after she did not eat much the previous day. This only happened once. She has had lows before and knows how to manage.  Patient reported dietary habits: eats consistent meals except for the day before she had a hypoglycemic event.  Patient reported exercise habits: none   Patient denies nocturia.  Patient denies neuropathy. Patient denies visual changes. Patient reports self foot exams.   Patient reports home blood pressures around 130s/80s. Last night is was 136/83. She reports that she feels very stressed when she comes to the doctor and that the drive over is stressful.  O:  Physical Exam   ROS   Lab Results  Component Value Date   HGBA1C 8.9 06/10/2017   Vitals:   06/30/17 1038  BP: (!) 170/89  Pulse: 76    Home fasting CBG: 60, 80-130, generally 120s  2 hour post-prandial/random CBG: 170s  A/P: Diabetes longstanding currently uncontrolled based on A1c of 8.9. Patient reports hypoglycemic events and is able to verbalize appropriate hypoglycemia management plan. Patient reports adherence with medication.   Continue current medications as prescribed. She is very well controlled except for the 1 episode of hypoglycemia and  the cause of it was not eating much the day before. Will continue to monitor and if she has repeat hypoglycemia, would decrease the Lantus. Patient rarely uses Novolog so she will continue to use it as needed.Next A1C anticipated December 2018.    Hypertension longstanding currently uncontrolled in office but appears to be better controlled at home based on patient report.  Patient reports adherence with medication. Patient to return next week with her blood pressure machine so that I can review the numbers and also compare it to our reading in office to make sure that it is accurate. If needed, can titrate medications at that time.  Written patient instructions provided.  Total time in face to face counseling 20 minutes.   Follow up in Pharmacist Clinic Visit in 1 week.   Patient seen with Waverly Ferrari, PharmD Candidate

## 2017-06-30 NOTE — Patient Instructions (Addendum)
Thanks for coming to see [place]!  No changes to your medicines today  Come back in 1 week with me with blood pressure machine and blood sugar monitor   Hypoglycemia Hypoglycemia occurs when the level of sugar (glucose) in the blood is too low. Glucose is a type of sugar that provides the body's main source of energy. Certain hormones (insulin and glucagon) control the level of glucose in the blood. Insulin lowers blood glucose, and glucagon increases blood glucose. Hypoglycemia can result from having too much insulin in the bloodstream, or from not eating enough food that contains glucose. Hypoglycemia can happen in people who do or do not have diabetes. It can develop quickly, and it can be a medical emergency. What are the causes? Hypoglycemia occurs most often in people who have diabetes. If you have diabetes, hypoglycemia may be caused by:  Diabetes medicine.  Not eating enough, or not eating often enough.  Increased physical activity.  Drinking alcohol, especially when you have not eaten recently.  If you do not have diabetes, hypoglycemia may be caused by:  A tumor in the pancreas. The pancreas is the organ that makes insulin.  Not eating enough, or not eating for long periods at a time (fasting).  Severe infection or illness that affects the liver, heart, or kidneys.  Certain medicines.  You may also have reactive hypoglycemia. This condition causes hypoglycemia within 4 hours of eating a meal. This may occur after having stomach surgery. Sometimes, the cause of reactive hypoglycemia is not known. What increases the risk? Hypoglycemia is more likely to develop in:  People who have diabetes and take medicines to lower blood glucose.  People who abuse alcohol.  People who have a severe illness.  What are the signs or symptoms? Hypoglycemia may not cause any symptoms. If you have symptoms, they may include:  Hunger.  Anxiety.  Sweating and feeling  clammy.  Confusion.  Dizziness or feeling light-headed.  Sleepiness.  Nausea.  Increased heart rate.  Headache.  Blurry vision.  Seizure.  Nightmares.  Tingling or numbness around the mouth, lips, or tongue.  A change in speech.  Decreased ability to concentrate.  A change in coordination.  Restless sleep.  Tremors or shakes.  Fainting.  Irritability.  How is this diagnosed? Hypoglycemia is diagnosed with a blood test to measure your blood glucose level. This blood test is done while you are having symptoms. Your health care provider may also do a physical exam and review your medical history. If you do not have diabetes, other tests may be done to find the cause of your hypoglycemia. How is this treated? This condition can often be treated by immediately eating or drinking something that contains glucose, such as:  3-4 sugar tablets (glucose pills).  Glucose gel, 15-gram tube.  Fruit juice, 4 oz (120 mL).  Regular soda (not diet soda), 4 oz (120 mL).  Low-fat milk, 4 oz (120 mL).  Several pieces of hard candy.  Sugar or honey, 1 Tbsp.  Treating Hypoglycemia If You Have Diabetes  If you are alert and able to swallow safely, follow the 15:15 rule:  Take 15 grams of a rapid-acting carbohydrate. Rapid-acting options include: ? 1 tube of glucose gel. ? 3 glucose pills. ? 6-8 pieces of hard candy. ? 4 oz (120 mL) of fruit juice. ? 4 oz (120 ml) of regular (not diet) soda.  Check your blood glucose 15 minutes after you take the carbohydrate.  If the repeat blood glucose  level is still at or below 70 mg/dL (3.9 mmol/L), take 15 grams of a carbohydrate again.  If your blood glucose level does not increase above 70 mg/dL (3.9 mmol/L) after 3 tries, seek emergency medical care.  After your blood glucose level returns to normal, eat a meal or a snack within 1 hour.  Treating Severe Hypoglycemia Severe hypoglycemia is when your blood glucose level is at  or below 54 mg/dL (3 mmol/L). Severe hypoglycemia is an emergency. Do not wait to see if the symptoms will go away. Get medical help right away. Call your local emergency services (911 in the [place] Do not drive yourself to the hospital. If you have severe hypoglycemia and you cannot eat or drink, you may need an injection of glucagon. A family member or close friend should learn how to check your blood glucose and how to give you a glucagon injection. Ask your health care provider if you need to have an emergency glucagon injection kit available. Severe hypoglycemia may need to be treated in a hospital. The treatment may include getting glucose through an IV tube. You may also need treatment for the cause of your hypoglycemia. Follow these instructions at home: General instructions  Avoid any diets that cause you to not eat enough food. Talk with your health care provider before you start any new diet.  Take over-the-counter and prescription medicines only as told by your health care provider.  Limit alcohol intake to no more than 1 drink per day for nonpregnant women and 2 drinks per day for men. One drink equals 12 oz of beer, 5 oz of wine, or 1 oz of hard liquor.  Keep all follow-up visits as told by your health care provider. This is important. If You Have Diabetes:   Make sure you know the symptoms of hypoglycemia.  Always have a rapid-acting carbohydrate snack with you to treat low blood sugar.  Follow your diabetes management plan, as told by your health care provider. Make sure you: ? Take your medicines as directed. ? Follow your exercise plan. ? Follow your meal plan. Eat on time, and do not skip meals. ? Check your blood glucose as often as directed. Make sure to check your blood glucose before and after exercise. If you exercise longer or in a different way than usual, check your blood glucose more often. ? Follow your sick day plan whenever you cannot eat or drink normally.  Make this plan in advance with your health care provider.  Share your diabetes management plan with people in your workplace, school, and household.  Check your urine for ketones when you are ill and as told by your health care provider.  Carry a medical alert card or wear medical alert jewelry. If You Have Reactive Hypoglycemia or Low Blood Sugar From Other Causes:  Monitor your blood glucose as told by your health care provider.  Follow instructions from your health care provider about eating or drinking restrictions. Contact a health care provider if:  You have problems keeping your blood glucose in your target range.  You have frequent episodes of hypoglycemia. Get help right away if:  You continue to have hypoglycemia symptoms after eating or drinking something containing glucose.  Your blood glucose is at or below 54 mg/dL (3 mmol/L).  You have a seizure.  You faint. These symptoms may represent a serious problem that is an emergency. Do not wait to see if the symptoms will go away. Get medical help right away.  Call your local emergency services (911 in the [place] Do not drive yourself to the hospital. This information is not intended to replace advice given to you by your health care provider. Make sure you discuss any questions you have with your health care provider. Document Released: 09/28/2005 Document Revised: 03/11/2016 Document Reviewed: 11/01/2015 Elsevier Interactive Patient Education  [employer].

## 2017-07-05 MED FILL — LISINOPRIL 5 MG TABLET: 5 | 30 days supply | Qty: 15 | Fill #5

## 2017-07-06 ENCOUNTER — Ambulatory Visit (INDEPENDENT_AMBULATORY_CARE_PROVIDER_SITE_OTHER): Payer: Medicaid Other | Admitting: Cardiovascular Disease

## 2017-07-06 ENCOUNTER — Encounter: Payer: Self-pay | Admitting: Cardiovascular Disease

## 2017-07-06 VITALS — BP 160/70 | HR 76 | Ht 65.0 in | Wt 178.0 lb

## 2017-07-06 DIAGNOSIS — I5022 Chronic systolic (congestive) heart failure: Secondary | ICD-10-CM

## 2017-07-06 DIAGNOSIS — I10 Essential (primary) hypertension: Secondary | ICD-10-CM

## 2017-07-06 DIAGNOSIS — E1121 Type 2 diabetes mellitus with diabetic nephropathy: Secondary | ICD-10-CM | POA: Diagnosis not present

## 2017-07-06 DIAGNOSIS — N184 Chronic kidney disease, stage 4 (severe): Secondary | ICD-10-CM

## 2017-07-06 DIAGNOSIS — D631 Anemia in chronic kidney disease: Secondary | ICD-10-CM

## 2017-07-06 NOTE — Progress Notes (Signed)
Cardiology Office Note:    Date:  07/06/2017   ID:  Patricia, [name] 1955/12/01, MRN [id number]  PCP:  Ladell Pier, MD  Cardiologist:  Sanda Klein, MD    Referring MD: Ladell Pier, MD   Chief Complaint  Patient presents with  . Follow-up    pt c/o DOE   Congestive heart failure  History of Present Illness:    Patricia [name] is a 61 y.o. female with a hx of CHF with severely depressed left ventricular systolic function (EF 06-26% by echo February 2018), improved left ventricular systolic function medical therapy (EF 40% by nuclear QGS July 2018.   The etiology of her cardiomyopathy remains uncertain, since coronary angiography has been deferred due to severe anemia and moderate to severe kidney disease. She sees Dr. Pearson Grippe. She has had long-standing poorly controlled diabetes mellitus complicated by nephropathy/proteinuria, sensory neuropathy. 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 Colonoscopy performed August 23 showed diverticulosis without active bleeding, EGD showed antral gastritis, felt to be likely secondary to aspirin therapy (H pylori negative). She is now receiving her next injections and after receiving Feraheme has normalized iron stores. Her most recent hemoglobin was 8.5. Baseline creatinine seems to be around 2.5 but had increased to 3.17 on August 23, possibly worse after she had undergone colonoscopy prep August 23.  He continues to describe NYHA functional class II dyspnea, typically triggered by climbing the 14 steps to her home. She has not had any dyspnea at rest and denies edema. She has never had chest discomfort that could be considered angina pectoris. She feels very weak in the mornings if she takes her carvedilol. She is now taking the carvedilol twice a day at roughly 4 PM and 10 PM and tolerates it on that schedule. She has not had full-blown syncope and denies palpitations. (The dose of hydralazine was  decreased about 6 months ago when she had syncope in the store). She denies leg edema.   Past Medical History:  Diagnosis Date  . Allergy   . Anemia   . Anxiety   . Blood transfusion without reported diagnosis   . Cardiomyopathy ([name]) 11/2016   no ischemic eval due to CKD  . CHF (congestive heart failure) ([place])   . CKD (chronic kidney disease), stage IV ([place])   . Depression   . Diabetes mellitus without complication ([place])   . Hypertension   . Obesity     Past Surgical History:  Procedure Laterality Date  . ABDOMINAL HYSTERECTOMY    . BREAST SURGERY     reduction  . [place]    Current Medications: Current Meds  Medication Sig  . aspirin EC 81 MG tablet Take 1 tablet (81 mg total) by mouth daily.  Marland Kitchen atorvastatin (LIPITOR) 20 MG tablet Take 0.5 tablets (10 mg total) by mouth daily.  Marland Kitchen bismuth subsalicylate (PEPTO BISMOL) 262 MG/15ML suspension Take 30 mLs by mouth every 6 (six) hours as needed (as needed).  . carvedilol (COREG) 25 MG tablet Take 1 tablet (25 mg total) by mouth 2 (two) times daily with a meal.  . escitalopram (LEXAPRO) 20 MG tablet Take 1 tablet (20 mg total) by mouth at bedtime.  . furosemide (LASIX) 40 MG tablet Take 1 tablet (40 mg total) by mouth daily.  Marland Kitchen gabapentin (NEURONTIN) 100 MG capsule Take 1 capsule (100 mg total) by mouth at bedtime.  Marland Kitchen glipiZIDE (GLUCOTROL) 10 MG tablet TAKE 1  TABLET BY MOUTH 2 TIMES DAILY BEFORE A MEAL.  Marland Kitchen glucose blood (TRUE METRIX BLOOD GLUCOSE TEST) test strip Use as instructed  . hydrALAZINE (APRESOLINE) 50 MG tablet Take 1 tablet (50 mg total) by mouth 3 (three) times daily.  . insulin aspart (NOVOLOG) 100 UNIT/ML injection 3 units before meals.  . Insulin Glargine (LANTUS SOLOSTAR) 100 UNIT/ML Solostar Pen Inject 30 Units into the skin 2 (two) times daily.  . Insulin Pen Needle 29G X 12.7MM MISC 10 Units by Does not apply route 1 day or 1 dose.  . Insulin Syringe-Needle U-100 30G X 5/16" 1 ML MISC Test  3 times daily  . isosorbide mononitrate (IMDUR) 60 MG 24 hr tablet Take 1 tablet (60 mg total) by mouth daily.  Marland Kitchen lisinopril (PRINIVIL,ZESTRIL) 2.5 MG tablet Take 2.5 mg by mouth daily.  . TRUEPLUS LANCETS 28G MISC Use as directed   Current Facility-Administered Medications for the 07/06/17 encounter (Office Visit) with Sanda Klein, MD  Medication  . 0.9 %  sodium chloride infusion    Allergies:   Codone [hydrocodone]; Norvasc [amlodipine besylate]; and Sulfa antibiotics   Social History   Social History  . Marital status: Divorced    Spouse name: N/A  . Number of children: 2  . Years of education: N/A   Occupational History  . disabled    Social History Main Topics  . Smoking status: Never Smoker  . Smokeless tobacco: Never Used  . Alcohol use No  . Drug use: No  . Sexual activity: Not Currently    Birth control/ protection: Surgical   Other Topics Concern  . None   Social History Narrative  . None     Family History: The patient's family history includes Diabetes in her brother; Heart Problems in her maternal grandmother; Heart disease in her maternal grandfather; Kidney disease in her brother; Stomach cancer in her brother. There is no history of Rectal cancer, Esophageal cancer, Liver cancer, or Colon cancer. ROS:   Please see the history of present illness.  All other systems reviewed and are negative.  EKGs/Labs/Other Studies Reviewed:    EKG:  EKG is ordered today.  It shows normal sinus rhythm, voltage criteria for LVH, nonspecific T-wave changes, mildly prolonged QTC 470 ms  Recent Labs: 11/29/2016: ALT 62; B Natriuretic Peptide 1,295.3 01/30/2017: Platelets 241 06/07/2017: BUN 50; Creatinine, Ser 3.17; Potassium 4.6; Sodium 137 06/10/2017: Hemoglobin 8.5  Recent Lipid Panel    Component Value Date/Time   CHOL 144 12/01/2016 0616   TRIG 129 12/01/2016 0616   HDL 36 (L) 12/01/2016 0616   CHOLHDL 4.0 12/01/2016 0616   VLDL 26 12/01/2016 0616   LDLCALC  82 12/01/2016 0616    Physical Exam:    VS:  BP (!) 160/70   Pulse 76   Ht 5\' 5"  (1.651 m)   Wt 178 lb (80.7 kg)   BMI 29.62 kg/m     Wt Readings from Last 3 Encounters:  07/06/17 178 lb (80.7 kg)  06/17/17 177 lb (80.3 kg)  06/10/17 175 lb (79.4 kg)      General: Alert, oriented x3, no distress, Does not appear to be pale, borderline obese Head: no evidence of trauma, PERRL, EOMI, no exophtalmos or lid lag, no myxedema, no xanthelasma; normal ears, nose and oropharynx Neck: normal jugular venous pulsations and no hepatojugular reflux; brisk carotid pulses without delay and no carotid bruits Chest: clear to auscultation, no signs of consolidation by percussion or palpation, normal fremitus, symmetrical and full  respiratory excursions Cardiovascular: normal position and quality of the apical impulse, regular rhythm, normal first and second heart sounds, no murmurs, rubs, S4 gallop is present Abdomen: no tenderness or distention, no masses by palpation, no abnormal pulsatility or arterial bruits, normal bowel sounds, no hepatosplenomegaly Extremities: no clubbing, cyanosis or edema; 2+ radial, ulnar and brachial pulses bilaterally; 2+ right femoral, posterior tibial and dorsalis pedis pulses; 2+ left femoral, posterior tibial and dorsalis pedis pulses; no subclavian or femoral bruits Neurological: grossly nonfocal Psych: Normal mood and affect   ASSESSMENT:    1. Essential hypertension   2. Chronic systolic congestive heart failure ([place])   3. CKD (chronic kidney disease) stage 4, GFR 15-29 ml/min (H[facility])   4. Anemia in stage 4 chronic kidney disease ([place])   5. Diabetic nephropathy associated with type 2 diabetes mellitus ([place])    PLAN:    In order of problems listed above:  1. CHF: Still concerned that this may be due to coronary insufficiency. Risk factors include years of poorly controlled diabetes, hypertension, early surgical menopause and chronic kidney disease. Although  the nuclear perfusion pattern was normal, she could have stenosis of a dominant left coronary artery. Currently NYHA functional class II and clinically euvolemic. Will have to ask Dr. Adin Hector about the optimal preparation and timing for coronary angiography, especially since her most recent creatinine was higher than baseline. I don't think she will require percutaneous revascularization. If she does have CAD, she will probably require bypass 2. HTN: Her blood pressure is high, this may be due to the way she takes her carvedilol at 4 PM and 10 PM rather than in the morning and evening. By the time of her appointment the beta blocker will of worn off completely. Asked her to space out the carvedilol closer to every 12 hours. 3. CKD: Moderate to severely abnormal kidney function, baseline creatinine probably around 20 L/minute; at high risk for contrast-induced nephrotoxicity. Confer with Dr. Joelyn Oms about timing of angiography. 4. Anemia: No longer iron deficient. No serious sources of bleeding by upper and lower GI endoscopy. On EPO. 5. DM: Likely cause of her nephropathy and increases the likelihood of multivessel CAD. Last hemoglobin A1c 8.9%, slightly worse than 8.4% in February, improved from 14% in 2017.   Medication Adjustments/Labs and Tests Ordered: Current medicines are reviewed at length with the patient today.  Concerns regarding medicines are outlined above.  Orders Placed This Encounter  Procedures  . EKG 12-Lead   No orders of the defined types were placed in this encounter.   Signed, Sanda Klein, MD  07/06/2017 2:38 PM    [place]

## 2017-07-06 NOTE — Patient Instructions (Signed)
Dr Croitoru recommends that you schedule a follow-up appointment in 3 months.  If you need a refill on your cardiac medications before your next appointment, please call your pharmacy. 

## 2017-07-07 ENCOUNTER — Other Ambulatory Visit: Payer: Self-pay | Admitting: *Deleted

## 2017-07-07 ENCOUNTER — Ambulatory Visit
Admission: RE | Admit: 2017-07-07 | Discharge: 2017-07-07 | Disposition: A | Discharge status: Home / Self Care | Payer: Medicaid Other | Source: Ambulatory Visit | Attending: Internal Medicine | Admitting: Internal Medicine

## 2017-07-07 ENCOUNTER — Telehealth: Payer: Self-pay | Admitting: Cardiovascular Disease

## 2017-07-07 DIAGNOSIS — Z1239 Encounter for other screening for malignant neoplasm of breast: Secondary | ICD-10-CM

## 2017-07-07 MED ORDER — ISOSORBIDE MONONITRATE ER 60 MG PO TB24: 60.0000 mg | ORAL_TABLET | Freq: Every day | ORAL | 3 refills | Status: DC

## 2017-07-07 NOTE — Telephone Encounter (Signed)
°*  STAT* If patient is at the pharmacy, call can be transferred to refill team.   1. Which medications need to be refilled? (please list name of each medication and dose if known)Isosorbide  2. Which pharmacy/location (including street and city if local pharmacy) is medication to be sent to?[place] [facility] 3. Do they need a 30 day or 90 day supply? 30 and refills

## 2017-07-08 ENCOUNTER — Ambulatory Visit: Payer: Medicaid Other | Attending: Internal Medicine | Admitting: Pharmacist

## 2017-07-08 ENCOUNTER — Encounter (HOSPITAL_COMMUNITY)
Admission: RE | Admit: 2017-07-08 | Discharge: 2017-07-08 | Disposition: A | Discharge status: Home / Self Care | Payer: Medicaid Other | Source: Ambulatory Visit | Attending: Nephrology | Admitting: Nephrology

## 2017-07-08 VITALS — BP 178/82 | HR 92

## 2017-07-08 DIAGNOSIS — I10 Essential (primary) hypertension: Secondary | ICD-10-CM | POA: Diagnosis present

## 2017-07-08 DIAGNOSIS — Z7689 Persons encountering health services in other specified circumstances: Secondary | ICD-10-CM | POA: Diagnosis present

## 2017-07-08 DIAGNOSIS — D631 Anemia in chronic kidney disease: Secondary | ICD-10-CM | POA: Diagnosis not present

## 2017-07-08 DIAGNOSIS — E119 Type 2 diabetes mellitus without complications: Secondary | ICD-10-CM | POA: Diagnosis present

## 2017-07-08 DIAGNOSIS — N184 Chronic kidney disease, stage 4 (severe): Secondary | ICD-10-CM

## 2017-07-08 LAB — RENAL FUNCTION PANEL
ALBUMIN: 2.5 g/dL — AB (ref 3.5–5.0)
Anion gap: 8 (ref 5–15)
BUN: 58 mg/dL — AB (ref 6–20)
CO2: 21 mmol/L — ABNORMAL LOW (ref 22–32)
Calcium: 8.2 mg/dL — ABNORMAL LOW (ref 8.9–10.3)
Chloride: 110 mmol/L (ref 101–111)
Creatinine, Ser: 2.73 mg/dL — ABNORMAL HIGH (ref 0.44–1.00)
GFR calc Af Amer: 21 mL/min — ABNORMAL LOW (ref >60)
GFR calc non Af Amer: 18 mL/min — ABNORMAL LOW (ref >60)
GLUCOSE: 87 mg/dL (ref 65–99)
PHOSPHORUS: 5.2 mg/dL — AB (ref 2.5–4.6)
POTASSIUM: 4.3 mmol/L (ref 3.5–5.1)
Sodium: 139 mmol/L (ref 135–145)

## 2017-07-08 LAB — FERRITIN: Ferritin: 312 ng/mL — ABNORMAL HIGH (ref 11–307)

## 2017-07-08 LAB — POCT HEMOGLOBIN-HEMACUE: Hemoglobin: 10.3 g/dL — ABNORMAL LOW (ref 12.0–15.0)

## 2017-07-08 LAB — IRON AND TIBC
Iron: 73 ug/dL (ref 28–170)
SATURATION RATIOS: 27 % (ref 10.4–31.8)
TIBC: 273 ug/dL (ref 250–450)
UIBC: 200 ug/dL

## 2017-07-08 MED ORDER — DARBEPOETIN ALFA 150 MCG/0.3ML IJ SOSY
150.0000 ug | PREFILLED_SYRINGE | INTRAMUSCULAR | Status: DC
  Administered 2017-07-08: 150 ug via SUBCUTANEOUS

## 2017-07-08 MED ORDER — DARBEPOETIN ALFA 150 MCG/0.3ML IJ SOSY
PREFILLED_SYRINGE | INTRAMUSCULAR | Status: AC
  Administered 2017-07-08: 150 ug via SUBCUTANEOUS
  Filled 2017-07-08: qty 0.3

## 2017-07-08 MED ORDER — BLOOD PRESSURE KIT DEVI: 1.0000 "application " | Freq: Every day | 0 refills | Status: DC

## 2017-07-08 MED ORDER — INSULIN GLARGINE 100 UNIT/ML SOLOSTAR PEN: 27.0000 [IU] | PEN_INJECTOR | Freq: Two times a day (BID) | SUBCUTANEOUS | 0 refills | Status: DC

## 2017-07-08 NOTE — Addendum Note (Signed)
Addended by: Rica Mast on: 07/08/2017 11:46 AM   Modules accepted: Orders

## 2017-07-08 NOTE — Progress Notes (Signed)
    S:     Chief Complaint  Patient presents with  . Medication Management    Patient arrives in good spirits.  Presents for diabetes evaluation, education, and management at the request of Dr. Wynetta Emery. Patient was referred on 06/10/17.  Patient was last seen by Primary Care Provider on 06/10/17.   Patient reports adherence with medications.  Current diabetes medications include: Novolog 3 units before meals, Lantus 30 units, and glipizide 10 mg BID. She only takes the Novolog when she feels like her blood sugar isn't too low.  Current hypertension medications include: carvedilol 25 mg BID, furosemide 40 mg daily, hydralazine 50 mg TID, isosorbide mononitrate 60 mg daily. She was supposed to start taking carvedilol 12 hours apart but does not do that and continues to take it at 4pm and 10 pm. Ran out of isosorbide a few days ago.  Patient reports hypoglycemic events. She reports fastings in the 78s and 70s  Patient reported dietary habits: eats consistent meals except for the day before she had a hypoglycemic event.  Patient reported exercise habits: none   Patient denies nocturia.  Patient denies neuropathy. Patient denies visual changes. Patient reports self foot exams.   Patient reports home blood pressures around 130s/80s. Last night is was 178/81. Brought with her her blood pressure cuff.  O:  Physical Exam   ROS   Lab Results  Component Value Date   HGBA1C 8.9 06/10/2017   There were no vitals filed for this visit.  Home fasting CBG: 68-90s  2 hour post-prandial/random CBG: [id number]  A/P: Diabetes longstanding currently uncontrolled based on A1c of 8.9. Patient reports hypoglycemic events and is able to verbalize appropriate hypoglycemia management plan. Patient reports adherence with medication.   Due to low fastings, will decrease Lantus to 27 units daily. Hopefully by bringing her fastings up, she will eat less (she eats more in the morning to get her blood sugar  up) and then her post-prandials will be better. Patient rarely uses Novolog so she will continue to use it as needed. Next A1C anticipated December 2018.    Hypertension longstanding currently uncontrolled. Patient's blood pressure machine gives the same reading for systolic but higher diastolic and lower pulse. Patient needs to be adherent to medication schedule and medications before any changes can be made. Patient to start taking carvedilol 12 hours apart and will pick up isosorbide today. Will also get new home blood pressure cuff. Will return in 2 weeks for BP check.  Written patient instructions provided.  Total time in face to face counseling 20 minutes.   Follow up in Pharmacist Clinic Visit in 2 week .   Patient seen with Waverly Ferrari, PharmD Candidate

## 2017-07-08 NOTE — Patient Instructions (Addendum)
Thank you for coming to see [place]!  Decrease Lantus to 27 units daily.   Please take your carvedilol 12 hours apart in the morning and at night!  Pick up your isosorbide today.  Come back in 2 weeks   Hypoglycemia Hypoglycemia is when the sugar (glucose) level in the blood is too low. Symptoms of low blood sugar may include:  Feeling: ? Hungry. ? Worried or nervous (anxious). ? Sweaty and clammy. ? Confused. ? Dizzy. ? Sleepy. ? Sick to your stomach (nauseous).  Having: ? A fast heartbeat. ? A headache. ? A change in your vision. ? Jerky movements that you cannot control (seizure). ? Nightmares. ? Tingling or no feeling (numbness) around the mouth, lips, or tongue.  Having trouble with: ? Talking. ? Paying attention (concentrating). ? Moving (coordination). ? Sleeping.  Shaking.  Passing out (fainting).  Getting upset easily (irritability).  Low blood sugar can happen to people who have diabetes and people who do not have diabetes. Low blood sugar can happen quickly, and it can be an emergency. Treating Low Blood Sugar Low blood sugar is often treated by eating or drinking something sugary right away. If you can think clearly and swallow safely, follow the 15:15 rule:  Take 15 grams of a fast-acting carb (carbohydrate). Some fast-acting carbs are: ? 1 tube of glucose gel. ? 3 sugar tablets (glucose pills). ? 6-8 pieces of hard candy. ? 4 oz (120 mL) of fruit juice. ? 4 oz (120 mL) of regular (not diet) soda.  Check your blood sugar 15 minutes after you take the carb.  If your blood sugar is still at or below 70 mg/dL (3.9 mmol/L), take 15 grams of a carb again.  If your blood sugar does not go above 70 mg/dL (3.9 mmol/L) after 3 tries, get help right away.  After your blood sugar goes back to normal, eat a meal or a snack within 1 hour.  Treating Very Low Blood Sugar If your blood sugar is at or below 54 mg/dL (3 mmol/L), you have very low blood sugar  (severe hypoglycemia). This is an emergency. Do not wait to see if the symptoms will go away. Get medical help right away. Call your local emergency services (911 in the [place] Do not drive yourself to the hospital. If you have very low blood sugar and you cannot eat or drink, you may need a glucagon shot (injection). A family member or friend should learn how to check your blood sugar and how to give you a glucagon shot. Ask your doctor if you need to have a glucagon shot kit at home. Follow these instructions at home: General instructions  Avoid any diets that cause you to not eat enough food. Talk with your doctor before you start any new diet.  Take over-the-counter and prescription medicines only as told by your doctor.  Limit alcohol to no more than 1 drink per day for nonpregnant women and 2 drinks per day for men. One drink equals 12 oz of beer, 5 oz of wine, or 1 oz of hard liquor.  Keep all follow-up visits as told by your doctor. This is important. If You Have Diabetes:   Make sure you know the symptoms of low blood sugar.  Always keep a source of sugar with you, such as: ? Sugar. ? Sugar tablets. ? Glucose gel. ? Fruit juice. ? Regular soda (not diet soda). ? Milk. ? Hard candy. ? Honey.  Take your medicines as told.  Follow your exercise and meal plan. ? Eat on time. Do not skip meals. ? Follow your sick day plan when you cannot eat or drink normally. Make this plan ahead of time with your doctor.  Check your blood sugar as often as told by your doctor. Always check before and after exercise.  Share your diabetes care plan with: ? Your work or school. ? People you live with.  Check your pee (urine) for ketones: ? When you are sick. ? As told by your doctor.  Carry a card or wear jewelry that says you have diabetes. If You Have Low Blood Sugar From Other Causes:   Check your blood sugar as often as told by your doctor.  Follow instructions from your  doctor about what you cannot eat or drink. Contact a doctor if:  You have trouble keeping your blood sugar in your target range.  You have low blood sugar often. Get help right away if:  You still have symptoms after you eat or drink something sugary.  Your blood sugar is at or below 54 mg/dL (3 mmol/L).  You have jerky movements that you cannot control.  You pass out. These symptoms may be an emergency. Do not wait to see if the symptoms will go away. Get medical help right away. Call your local emergency services (911 in the [place] Do not drive yourself to the hospital. This information is not intended to replace advice given to you by your health care provider. Make sure you discuss any questions you have with your health care provider. Document Released: 12/23/2009 Document Revised: 03/05/2016 Document Reviewed: 11/01/2015 Elsevier Interactive Patient Education  [employer].

## 2017-07-12 MED FILL — ISOSORBIDE MN ER 60 MG TAB: 60 | 30 days supply | Qty: 30 | Fill #0

## 2017-07-22 ENCOUNTER — Ambulatory Visit: Payer: Medicaid Other | Attending: Internal Medicine | Admitting: Pharmacist

## 2017-07-22 VITALS — BP 178/88 | HR 77

## 2017-07-22 DIAGNOSIS — Z79899 Other long term (current) drug therapy: Secondary | ICD-10-CM | POA: Diagnosis not present

## 2017-07-22 DIAGNOSIS — E118 Type 2 diabetes mellitus with unspecified complications: Secondary | ICD-10-CM | POA: Diagnosis not present

## 2017-07-22 DIAGNOSIS — Z794 Long term (current) use of insulin: Secondary | ICD-10-CM | POA: Diagnosis not present

## 2017-07-22 DIAGNOSIS — I10 Essential (primary) hypertension: Secondary | ICD-10-CM

## 2017-07-22 DIAGNOSIS — E11649 Type 2 diabetes mellitus with hypoglycemia without coma: Secondary | ICD-10-CM | POA: Insufficient documentation

## 2017-07-22 DIAGNOSIS — E119 Type 2 diabetes mellitus without complications: Secondary | ICD-10-CM | POA: Diagnosis present

## 2017-07-22 MED FILL — ATORVASTATIN 20 MG TABLET: 20 | 30 days supply | Qty: 15 | Fill #6

## 2017-07-22 MED FILL — LANTUS SOLOSTAR 100 UNITS/M: 100 | 25 days supply | Qty: 15 | Fill #3

## 2017-07-22 MED FILL — glipiZIDE 10 MG TABS: 10 | 30 days supply | Qty: 60 | Fill #6

## 2017-07-22 MED FILL — ESCITALOPRAM 20 MG TABLET: 20 | 30 days supply | Qty: 30 | Fill #2

## 2017-07-22 NOTE — Patient Instructions (Addendum)
Thanks for coming to see Patricia [name]  Go home and take your medications  Come back in 1 week for blood pressure check   Hypertension Hypertension is another name for high blood pressure. High blood pressure forces your heart to work harder to pump blood. This can cause problems over time. There are two numbers in a blood pressure reading. There is a top number (systolic) over a bottom number (diastolic). It is best to have a blood pressure below 120/80. Healthy choices can help lower your blood pressure. You may need medicine to help lower your blood pressure if:  Your blood pressure cannot be lowered with healthy choices.  Your blood pressure is higher than 130/80.  Follow these instructions at home: Eating and drinking  If directed, follow the DASH eating plan. This diet includes: ? Filling half of your plate at each meal with fruits and vegetables. ? Filling one quarter of your plate at each meal with whole grains. Whole grains include whole wheat pasta, brown rice, and whole grain bread. ? Eating or drinking low-fat dairy products, such as skim milk or low-fat yogurt. ? Filling one quarter of your plate at each meal with low-fat (lean) proteins. Low-fat proteins include fish, skinless chicken, eggs, beans, and tofu. ? Avoiding fatty meat, cured and processed meat, or chicken with skin. ? Avoiding premade or processed food.  Eat less than 1,500 mg of salt (sodium) a day.  Limit alcohol use to no more than 1 drink a day for nonpregnant women and 2 drinks a day for men. One drink equals 12 oz of beer, 5 oz of wine, or 1 oz of hard liquor. Lifestyle  Work with your doctor to stay at a healthy weight or to lose weight. Ask your doctor what the best weight is for you.  Get at least 30 minutes of exercise that causes your heart to beat faster (aerobic exercise) most days of the week. This may include walking, swimming, or biking.  Get at least 30 minutes of exercise that strengthens your  muscles (resistance exercise) at least 3 days a week. This may include lifting weights or pilates.  Do not use any products that contain nicotine or tobacco. This includes cigarettes and e-cigarettes. If you need help quitting, ask your doctor.  Check your blood pressure at home as told by your doctor.  Keep all follow-up visits as told by your doctor. This is important. Medicines  Take over-the-counter and prescription medicines only as told by your doctor. Follow directions carefully.  Do not skip doses of blood pressure medicine. The medicine does not work as well if you skip doses. Skipping doses also puts you at risk for problems.  Ask your doctor about side effects or reactions to medicines that you should watch for. Contact a doctor if:  You think you are having a reaction to the medicine you are taking.  You have headaches that keep coming back (recurring).  You feel dizzy.  You have swelling in your ankles.  You have trouble with your vision. Get help right away if:  You get a very bad headache.  You start to feel confused.  You feel weak or numb.  You feel faint.  You get very bad pain in your: ? Chest. ? Belly (abdomen).  You throw up (vomit) more than once.  You have trouble breathing. Summary  Hypertension is another name for high blood pressure.  Making healthy choices can help lower blood pressure. If your blood pressure cannot be  controlled with healthy choices, you may need to take medicine. This information is not intended to replace advice given to you by your health care provider. Make sure you discuss any questions you have with your health care provider. Document Released: 03/16/2008 Document Revised: 08/26/2016 Document Reviewed: 08/26/2016 Elsevier Interactive Patient Education  2018 [place] Eating Plan DASH stands for "Dietary Approaches to Stop Hypertension." The DASH eating plan is a healthy eating plan that has been shown  to reduce high blood pressure (hypertension). It may also reduce your risk for type 2 diabetes, heart disease, and stroke. The DASH eating plan may also help with weight loss. What are tips for following this plan? General guidelines  Avoid eating more than 2,300 mg (milligrams) of salt (sodium) a day. If you have hypertension, you may need to reduce your sodium intake to 1,500 mg a day.  Limit alcohol intake to no more than 1 drink a day for nonpregnant women and 2 drinks a day for men. One drink equals 12 oz of beer, 5 oz of wine, or 1 oz of hard liquor.  Work with your health care provider to maintain a healthy body weight or to lose weight. Ask what an ideal weight is for you.  Get at least 30 minutes of exercise that causes your heart to beat faster (aerobic exercise) most days of the week. Activities may include walking, swimming, or biking.  Work with your health care provider or diet and nutrition specialist (dietitian) to adjust your eating plan to your individual calorie needs. Reading food labels  Check food labels for the amount of sodium per serving. Choose foods with less than 5 percent of the Daily Value of sodium. Generally, foods with less than 300 mg of sodium per serving fit into this eating plan.  To find whole grains, look for the word "whole" as the first word in the ingredient list. Shopping  Buy products labeled as "low-sodium" or "no salt added."  Buy fresh foods. Avoid canned foods and premade or frozen meals. Cooking  Avoid adding salt when cooking. Use salt-free seasonings or herbs instead of table salt or sea salt. Check with your health care provider or pharmacist before using salt substitutes.  Do not fry foods. Cook foods using healthy methods such as baking, boiling, grilling, and broiling instead.  Cook with heart-healthy oils, such as olive, canola, soybean, or sunflower oil. Meal planning   Eat a balanced diet that includes: ? 5 or more servings  of fruits and vegetables each day. At each meal, try to fill half of your plate with fruits and vegetables. ? Up to 6-8 servings of whole grains each day. ? Less than 6 oz of lean meat, poultry, or fish each day. A 3-oz serving of meat is about the same size as a deck of cards. One egg equals 1 oz. ? 2 servings of low-fat dairy each day. ? A serving of nuts, seeds, or beans 5 times each week. ? Heart-healthy fats. Healthy fats called Omega-3 fatty acids are found in foods such as flaxseeds and coldwater fish, like sardines, salmon, and mackerel.  Limit how much you eat of the following: ? Canned or prepackaged foods. ? Food that is high in trans fat, such as fried foods. ? Food that is high in saturated fat, such as fatty meat. ? Sweets, desserts, sugary drinks, and other foods with added sugar. ? Full-fat dairy products.  Do not salt foods before eating.  Try to  eat at least 2 vegetarian meals each week.  Eat more home-cooked food and less restaurant, buffet, and fast food.  When eating at a restaurant, ask that your food be prepared with less salt or no salt, if possible. What foods are recommended? The items listed may not be a complete list. Talk with your dietitian about what dietary choices are best for you. Grains Whole-grain or whole-wheat bread. Whole-grain or whole-wheat pasta. Brown rice. [place]. Bulgur. Whole-grain and low-sodium cereals. Pita bread. Low-fat, low-sodium crackers. Whole-wheat flour tortillas. Vegetables Fresh or frozen vegetables (raw, steamed, roasted, or grilled). Low-sodium or reduced-sodium tomato and vegetable juice. Low-sodium or reduced-sodium tomato sauce and tomato paste. Low-sodium or reduced-sodium canned vegetables. Fruits All fresh, dried, or frozen fruit. Canned fruit in natural juice (without added sugar). Meat and other protein foods Skinless chicken or [place]. Ground chicken or [place]. Pork with fat trimmed off. Fish and seafood. Egg  whites. Dried beans, peas, or lentils. Unsalted nuts, nut butters, and seeds. Unsalted canned beans. Lean cuts of beef with fat trimmed off. Low-sodium, lean deli meat. Dairy Low-fat (1%) or fat-free (skim) milk. Fat-free, low-fat, or reduced-fat cheeses. Nonfat, low-sodium ricotta or cottage cheese. Low-fat or nonfat yogurt. Low-fat, low-sodium cheese. Fats and oils Soft margarine without trans fats. Vegetable oil. Low-fat, reduced-fat, or light mayonnaise and salad dressings (reduced-sodium). Canola, safflower, olive, soybean, and sunflower oils. Avocado. Seasoning and other foods Herbs. Spices. Seasoning mixes without salt. Unsalted popcorn and pretzels. Fat-free sweets. What foods are not recommended? The items listed may not be a complete list. Talk with your dietitian about what dietary choices are best for you. Grains Baked goods made with fat, such as croissants, muffins, or some breads. Dry pasta or rice meal packs. Vegetables Creamed or fried vegetables. Vegetables in a cheese sauce. Regular canned vegetables (not low-sodium or reduced-sodium). Regular canned tomato sauce and paste (not low-sodium or reduced-sodium). Regular tomato and vegetable juice (not low-sodium or reduced-sodium). Angie Fava. Olives. Fruits Canned fruit in a light or heavy syrup. Fried fruit. Fruit in cream or butter sauce. Meat and other protein foods Fatty cuts of meat. Ribs. Fried meat. Berniece Salines. Sausage. Bologna and other processed lunch meats. Salami. Fatback. Hotdogs. Bratwurst. Salted nuts and seeds. Canned beans with added salt. Canned or smoked fish. Whole eggs or egg yolks. Chicken or [place] with skin. Dairy Whole or 2% milk, cream, and half-and-half. Whole or full-fat cream cheese. Whole-fat or sweetened yogurt. Full-fat cheese. Nondairy creamers. Whipped toppings. Processed cheese and cheese spreads. Fats and oils Butter. Stick margarine. Lard. Shortening. Ghee. Bacon fat. Tropical oils, such as coconut,  palm kernel, or palm oil. Seasoning and other foods Salted popcorn and pretzels. Onion salt, garlic salt, seasoned salt, table salt, and sea salt. Worcestershire sauce. Tartar sauce. Barbecue sauce. Teriyaki sauce. Soy sauce, including reduced-sodium. Steak sauce. Canned and packaged gravies. Fish sauce. Oyster sauce. Cocktail sauce. Horseradish that you find on the shelf. Ketchup. Mustard. Meat flavorings and tenderizers. Bouillon cubes. Hot sauce and Tabasco sauce. Premade or packaged marinades. Premade or packaged taco seasonings. Relishes. Regular salad dressings. Where to find more information:  National Heart, Lung, and [employer]: https://wilson-eaton.com/  American Heart Association: www.heart.org Summary  The DASH eating plan is a healthy eating plan that has been shown to reduce high blood pressure (hypertension). It may also reduce your risk for type 2 diabetes, heart disease, and stroke.  With the DASH eating plan, you should limit salt (sodium) intake to 2,300 mg a day. If you have  hypertension, you may need to reduce your sodium intake to 1,500 mg a day.  When on the DASH eating plan, aim to eat more fresh fruits and vegetables, whole grains, lean proteins, low-fat dairy, and heart-healthy fats.  Work with your health care provider or diet and nutrition specialist (dietitian) to adjust your eating plan to your individual calorie needs. This information is not intended to replace advice given to you by your health care provider. Make sure you discuss any questions you have with your health care provider. Document Released: 09/17/2011 Document Revised: 09/21/2016 Document Reviewed: 09/21/2016 Elsevier Interactive Patient Education  2017 [employer].

## 2017-07-22 NOTE — Progress Notes (Signed)
    S:     No chief complaint on file.   Patient arrives in good spirits.  Presents for diabetes evaluation, education, and management at the request of Dr. Wynetta Emery. Patient was referred on 06/10/17.  Patient was last seen by Primary Care Provider on 06/10/17.   Patient reports adherence with medications.  Current diabetes medications include: Novolog 3 units before meals, Lantus 27 units, and glipizide 10 mg BID. She only takes the Novolog when she feels like her blood sugar isn't too low.  Current hypertension medications include: carvedilol 25 mg BID, furosemide 40 mg daily, hydralazine 50 mg TID, isosorbide mononitrate 60 mg daily. She did not take any of her medications this morning. Last dose was last night.  Patient reports hypoglycemic events but only in the 70s now.  Patient reported dietary habits: eats consistent meals except for the day before she had a hypoglycemic event.  Patient reported exercise habits: none   Patient denies nocturia.  Patient denies neuropathy. Patient denies visual changes. Patient reports self foot exams.   Patient reports home blood pressures are elevated but does not have a log book or the monitor.  O:  Physical Exam   ROS   Lab Results  Component Value Date   HGBA1C 8.9 06/10/2017   There were no vitals filed for this visit.  Home fasting CBG: 70s-140s, one 200 per her report 2 hour post-prandial/random CBG: 137-200  A/P: Diabetes longstanding currently uncontrolled based on A1c of 8.9. Patient reports hypoglycemic events and is able to verbalize appropriate hypoglycemia management plan. Patient reports adherence with medication.   Continue current medications as prescribed. Patient does not have a log book or meter and is trying to remember her blood sugars but reports most are in the 100s. Will adjust medications as needed when log book is brought in. Next A1C anticipated December 2018.    Hypertension longstanding currently  uncontrolled. However, patient did not take her medications this morning. Cannot adjust her medications until I know what her readings are on her medications. Stressed the importance of taking her medications as scheduled. Will return in 1 week for BP check and patient will go home now and take her medications.  Written patient instructions provided.  Total time in face to face counseling 20 minutes.   Follow up in Pharmacist Clinic Visit in 1 week .   Patient seen with Thornton Park, PharmD Candidate

## 2017-08-03 ENCOUNTER — Ambulatory Visit: Payer: Medicaid Other | Attending: Internal Medicine | Admitting: Pharmacist

## 2017-08-03 VITALS — BP 182/92 | HR 77

## 2017-08-03 DIAGNOSIS — I10 Essential (primary) hypertension: Secondary | ICD-10-CM

## 2017-08-03 DIAGNOSIS — Z794 Long term (current) use of insulin: Secondary | ICD-10-CM | POA: Diagnosis not present

## 2017-08-03 DIAGNOSIS — E118 Type 2 diabetes mellitus with unspecified complications: Secondary | ICD-10-CM | POA: Diagnosis not present

## 2017-08-03 MED ORDER — HYDRALAZINE HCL 100 MG PO TABS: 100.0000 mg | ORAL_TABLET | Freq: Two times a day (BID) | ORAL | 2 refills | Status: DC

## 2017-08-03 NOTE — Patient Instructions (Signed)
Thanks for coming to see [place]  Increase hydralazine to 100 mg but only take it twice a day  Come back in 2 weeks for blood pressure

## 2017-08-03 NOTE — Progress Notes (Signed)
    S:     Chief Complaint  Patient presents with  . Medication Management    Patient arrives in good spirits.  Presents for diabetes evaluation, education, and management at the request of Dr. Wynetta Emery. Patient was referred on 06/10/17.  Patient was last seen by Primary Care Provider on 06/10/17.   Patient reports adherence with medications.  Current diabetes medications include: Novolog 3 units before meals, Lantus 27 units, and glipizide 10 mg BID. Has not taken Novolog recently.  Current hypertension medications include: carvedilol 25 mg BID, furosemide 40 mg daily, hydralazine 50 mg TID, isosorbide mononitrate 60 mg daily. She did not take any of her medications this morning. Last dose was last night.  Patient denies hypoglycemic events but didn't check her blood sugar much over the past week due to illness.  Patient reported dietary habits: hasn't eaten as much over the last week or so because she has been sick with a flu-like illness. Reported that she drank a lot of orange juice while she was sick.  Patient reported exercise habits: none   Patient denies nocturia.  Patient denies neuropathy. Patient denies visual changes. Patient reports self foot exams.   Patient reports home blood pressures are 170s/90s   O:  Physical Exam   ROS   Lab Results  Component Value Date   HGBA1C 8.9 06/10/2017   Vitals:   08/03/17 1012  BP: (!) 182/92  Pulse: 77    Home fasting CBG: 90s, 100 this morning 2 hour post-prandial/random CBG: 200s-230s  A/P: Diabetes longstanding currently uncontrolled based on A1c of 8.9. Patient denies hypoglycemic events and is able to verbalize appropriate hypoglycemia management plan. Patient reports adherence with medication. Control suboptimal due to sedentary lifestyle.  Continue current medications as prescribed. Since patient was sick last week, elevated blood sugars could be do to that. She feels better now so no changes and will reassess in  a few weeks. Next A1C anticipated December 2018.    Hypertension longstanding currently uncontrolled in clinic and at home. Increase hydralazine to 100 mg BID from hydralazine 50 mg TID. I think this will help with compliance which has been difficult for her at times. Will recheck blood pressure in 2 weeks. No clonidine given in office as patient is about to take her daily carvedilol dose and her pulse may not be able to tolerate the clonidine.   Written patient instructions provided.  Total time in face to face counseling 20 minutes.   Follow up in Pharmacist Clinic Visit in 2 weeks .   Patient seen with Thornton Park, PharmD Candidate

## 2017-08-04 ENCOUNTER — Encounter (HOSPITAL_COMMUNITY)
Admission: RE | Admit: 2017-08-04 | Discharge: 2017-08-04 | Disposition: A | Discharge status: Home / Self Care | Payer: Medicaid Other | Source: Ambulatory Visit | Attending: Nephrology | Admitting: Nephrology

## 2017-08-04 DIAGNOSIS — D631 Anemia in chronic kidney disease: Secondary | ICD-10-CM | POA: Insufficient documentation

## 2017-08-04 DIAGNOSIS — N184 Chronic kidney disease, stage 4 (severe): Secondary | ICD-10-CM

## 2017-08-04 LAB — IRON AND TIBC
Iron: 80 ug/dL (ref 28–170)
Saturation Ratios: 31 % (ref 10.4–31.8)
TIBC: 258 ug/dL (ref 250–450)
UIBC: 178 ug/dL

## 2017-08-04 LAB — RENAL FUNCTION PANEL
Albumin: 2.6 g/dL — ABNORMAL LOW (ref 3.5–5.0)
Anion gap: 5 (ref 5–15)
BUN: 57 mg/dL — AB (ref 6–20)
CHLORIDE: 112 mmol/L — AB (ref 101–111)
CO2: 20 mmol/L — ABNORMAL LOW (ref 22–32)
Calcium: 8.4 mg/dL — ABNORMAL LOW (ref 8.9–10.3)
Creatinine, Ser: 2.96 mg/dL — ABNORMAL HIGH (ref 0.44–1.00)
GFR calc Af Amer: 19 mL/min — ABNORMAL LOW (ref >60)
GFR calc non Af Amer: 16 mL/min — ABNORMAL LOW (ref >60)
GLUCOSE: 195 mg/dL — AB (ref 65–99)
POTASSIUM: 5.2 mmol/L — AB (ref 3.5–5.1)
Phosphorus: 4.9 mg/dL — ABNORMAL HIGH (ref 2.5–4.6)
Sodium: 137 mmol/L (ref 135–145)

## 2017-08-04 LAB — POCT HEMOGLOBIN-HEMACUE: HEMOGLOBIN: 11.1 g/dL — AB (ref 12.0–15.0)

## 2017-08-04 LAB — FERRITIN: Ferritin: 258 ng/mL (ref 11–307)

## 2017-08-04 MED ORDER — DARBEPOETIN ALFA 150 MCG/0.3ML IJ SOSY
PREFILLED_SYRINGE | INTRAMUSCULAR | Status: AC
  Filled 2017-08-04: qty 0.3

## 2017-08-04 MED ORDER — DARBEPOETIN ALFA 150 MCG/0.3ML IJ SOSY
150.0000 ug | PREFILLED_SYRINGE | INTRAMUSCULAR | Status: DC
  Administered 2017-08-04: 10:00:00 150 ug via SUBCUTANEOUS

## 2017-08-13 MED FILL — ISOSORBIDE MN ER 60 MG TAB: 60 | 30 days supply | Qty: 30 | Fill #1

## 2017-08-13 MED FILL — LISINOPRIL 5 MG TAB: 5 | 30 days supply | Qty: 15 | Fill #6

## 2017-08-13 MED FILL — GABAPENTIN 100 MG CAPSULE: 100 | 30 days supply | Qty: 30 | Fill #7

## 2017-08-17 ENCOUNTER — Encounter: Payer: Self-pay | Admitting: Pharmacist

## 2017-08-17 ENCOUNTER — Ambulatory Visit: Payer: Medicaid Other | Attending: Internal Medicine | Admitting: Pharmacist

## 2017-08-17 VITALS — BP 184/96 | HR 76

## 2017-08-17 DIAGNOSIS — Z794 Long term (current) use of insulin: Secondary | ICD-10-CM | POA: Diagnosis not present

## 2017-08-17 DIAGNOSIS — I10 Essential (primary) hypertension: Secondary | ICD-10-CM | POA: Diagnosis not present

## 2017-08-17 DIAGNOSIS — E118 Type 2 diabetes mellitus with unspecified complications: Secondary | ICD-10-CM | POA: Diagnosis present

## 2017-08-17 NOTE — Patient Instructions (Addendum)
Thanks for coming to see me  Take the Novolog before dinner if you are going to have a big meal.  Make sure you eat 3 meals a day to prevent hypoglycemia. If you continue to have low blood sugars (less than 70) let us know and we can change your insulin dose.  Take your blood pressure about 30 minutes after you take your blood pressure medications. Please take your blood pressure medications before coming to your visit with Dr. Wynetta Emery so she can get an accurate reading of your blood pressure on the medications.   Come back in 3 weeks for DM/HTN follow up with Dr. Wynetta Emery as scheduled.   Hypoglycemia Hypoglycemia is when the sugar (glucose) level in the blood is too low. Symptoms of low blood sugar may include:  Feeling: ? Hungry. ? Worried or nervous (anxious). ? Sweaty and clammy. ? Confused. ? Dizzy. ? Sleepy. ? Sick to your stomach (nauseous).  Having: ? A fast heartbeat. ? A headache. ? A change in your vision. ? Jerky movements that you cannot control (seizure). ? Nightmares. ? Tingling or no feeling (numbness) around the mouth, lips, or tongue.  Having trouble with: ? Talking. ? Paying attention (concentrating). ? Moving (coordination). ? Sleeping.  Shaking.  Passing out (fainting).  Getting upset easily (irritability).  Low blood sugar can happen to people who have diabetes and people who do not have diabetes. Low blood sugar can happen quickly, and it can be an emergency. Treating Low Blood Sugar Low blood sugar is often treated by eating or drinking something sugary right away. If you can think clearly and swallow safely, follow the 15:15 rule:  Take 15 grams of a fast-acting carb (carbohydrate). Some fast-acting carbs are: ? 1 tube of glucose gel. ? 3 sugar tablets (glucose pills). ? 6-8 pieces of hard candy. ? 4 oz (120 mL) of fruit juice. ? 4 oz (120 mL) of regular (not diet) soda.  Check your blood sugar 15 minutes after you take the carb.  If  your blood sugar is still at or below 70 mg/dL (3.9 mmol/L), take 15 grams of a carb again.  If your blood sugar does not go above 70 mg/dL (3.9 mmol/L) after 3 tries, get help right away.  After your blood sugar goes back to normal, eat a meal or a snack within 1 hour.  Treating Very Low Blood Sugar If your blood sugar is at or below 54 mg/dL (3 mmol/L), you have very low blood sugar (severe hypoglycemia). This is an emergency. Do not wait to see if the symptoms will go away. Get medical help right away. Call your local emergency services (911 in the [place] Do not drive yourself to the hospital. If you have very low blood sugar and you cannot eat or drink, you may need a glucagon shot (injection). A family member or friend should learn how to check your blood sugar and how to give you a glucagon shot. Ask your doctor if you need to have a glucagon shot kit at home. Follow these instructions at home: General instructions  Avoid any diets that cause you to not eat enough food. Talk with your doctor before you start any new diet.  Take over-the-counter and prescription medicines only as told by your doctor.  Limit alcohol to no more than 1 drink per day for nonpregnant women and 2 drinks per day for men. One drink equals 12 oz of beer, 5 oz of wine, or 1 oz of hard  liquor.  Keep all follow-up visits as told by your doctor. This is important. If You Have Diabetes:   Make sure you know the symptoms of low blood sugar.  Always keep a source of sugar with you, such as: ? Sugar. ? Sugar tablets. ? Glucose gel. ? Fruit juice. ? Regular soda (not diet soda). ? Milk. ? Hard candy. ? Honey.  Take your medicines as told.  Follow your exercise and meal plan. ? Eat on time. Do not skip meals. ? Follow your sick day plan when you cannot eat or drink normally. Make this plan ahead of time with your doctor.  Check your blood sugar as often as told by your doctor. Always check before and after  exercise.  Share your diabetes care plan with: ? Your work or school. ? People you live with.  Check your pee (urine) for ketones: ? When you are sick. ? As told by your doctor.  Carry a card or wear jewelry that says you have diabetes. If You Have Low Blood Sugar From Other Causes:   Check your blood sugar as often as told by your doctor.  Follow instructions from your doctor about what you cannot eat or drink. Contact a doctor if:  You have trouble keeping your blood sugar in your target range.  You have low blood sugar often. Get help right away if:  You still have symptoms after you eat or drink something sugary.  Your blood sugar is at or below 54 mg/dL (3 mmol/L).  You have jerky movements that you cannot control.  You pass out. These symptoms may be an emergency. Do not wait to see if the symptoms will go away. Get medical help right away. Call your local emergency services (911 in the [place] Do not drive yourself to the hospital. This information is not intended to replace advice given to you by your health care provider. Make sure you discuss any questions you have with your health care provider. Document Released: 12/23/2009 Document Revised: 03/05/2016 Document Reviewed: 11/01/2015 Elsevier Interactive Patient Education  [employer].

## 2017-08-17 NOTE — Progress Notes (Signed)
    S:     Chief Complaint  Patient presents with  . Medication Management    Patient arrives in good spirits.  Presents for diabetes evaluation, education, and management at the request of Dr. Wynetta Emery. Patient was referred on 06/10/17.  Patient was last seen by Primary Care Provider on 06/10/17.   Patient reports adherence with medications. She did not take her medications before coming to clinic today. Last dose was about 12 hours ago.    Current diabetes medications include: Novolog 3 units before meals, Lantus 27 units, and glipizide 10 mg BID. Has not taken Novolog recently.  Current hypertension medications include: carvedilol 25 mg BID, furosemide 40 mg daily, hydralazine 100 mg BID, isosorbide mononitrate 60 mg daily. She did not take any of her medications this morning. Last dose was last night.  Patient reports hypoglycemia one morning when she didn't each much for dinner the night before. The reading was in the 60s.   Patient reported dietary habits: no changes, eating more than a few weeks ago when she was sick.   Patient reported exercise habits: none   Patient denies nocturia.  Patient denies neuropathy. Patient denies visual changes. Patient reports self foot exams.   Patient reports home blood pressures are 170s/90s still. She has a blood pressure cuff that goes on her wrist. She takes it first thing in the morning sometimes before she takes her medications and sometimes after but only a few minutes after taking the medication    O:  Physical Exam   ROS   Lab Results  Component Value Date   HGBA1C 8.9 06/10/2017   There were no vitals filed for this visit.  Home fasting CBG: 60s-120s, today was 118 2 hour post-prandial/random CBG: upper 100s-230s  A/P: Diabetes longstanding currently uncontrolled based on A1c of 8.9. Patient reports hypoglycemic events and is able to verbalize appropriate hypoglycemia management plan. Patient reports adherence with  medication. Control suboptimal due to sedentary lifestyle.  Continue current medications as prescribed. Instructed patient to take Novolog with dinner to see if that will help bring her post-prandials in the evening down. Patient will report any further hypoglycemia.  Next A1C anticipated December 2018.    Hypertension longstanding currently uncontrolled in clinic and at home. Uncontrolled in clinic due to not taking her medication yet today. Possibly uncontrolled at home because she is taking it right before her medications (usually 12 hours after last dose).  Patient to take her medications in the morning and then check her blood pressure about 30 minutes to 1 hour after. Patient also instructed to take her medications before she sees Dr. Wynetta Emery in a few weeks to make sure the clinic reading is accurate.  No clonidine given in office as patient is about to take her daily carvedilol dose and her pulse may not be able to tolerate the clonidine (currently 76 without this morning's carvedilol dose).  Written patient instructions provided.  Total time in face to face counseling 20 minutes.   Follow up with Dr. Wynetta Emery in 3 weeks.

## 2017-08-19 MED FILL — ATORVASTATIN 20 MG TABLET: 20 | 30 days supply | Qty: 15 | Fill #7

## 2017-08-19 MED FILL — glipiZIDE 10 MG TABS: 10 | 30 days supply | Qty: 60 | Fill #7

## 2017-08-19 MED FILL — ESCITALOPRAM 20 MG TABLET: 20 | 30 days supply | Qty: 30 | Fill #3

## 2017-09-01 ENCOUNTER — Encounter (HOSPITAL_COMMUNITY)
Admission: RE | Admit: 2017-09-01 | Discharge: 2017-09-01 | Disposition: A | Discharge status: Home / Self Care | Payer: Medicaid Other | Source: Ambulatory Visit | Attending: Nephrology | Admitting: Nephrology

## 2017-09-01 VITALS — BP 156/68 | HR 85 | Temp 98.1°F | Resp 18

## 2017-09-01 DIAGNOSIS — D631 Anemia in chronic kidney disease: Secondary | ICD-10-CM | POA: Insufficient documentation

## 2017-09-01 DIAGNOSIS — N184 Chronic kidney disease, stage 4 (severe): Secondary | ICD-10-CM

## 2017-09-01 LAB — RENAL FUNCTION PANEL
Albumin: 2.5 g/dL — ABNORMAL LOW (ref 3.5–5.0)
Anion gap: 8 (ref 5–15)
BUN: 52 mg/dL — AB (ref 6–20)
CALCIUM: 8.1 mg/dL — AB (ref 8.9–10.3)
CO2: 23 mmol/L (ref 22–32)
CREATININE: 3.33 mg/dL — AB (ref 0.44–1.00)
Chloride: 106 mmol/L (ref 101–111)
GFR, EST AFRICAN AMERICAN: 16 mL/min — AB (ref >60)
GFR, EST NON AFRICAN AMERICAN: 14 mL/min — AB (ref >60)
Glucose, Bld: 114 mg/dL — ABNORMAL HIGH (ref 65–99)
Phosphorus: 5.2 mg/dL — ABNORMAL HIGH (ref 2.5–4.6)
Potassium: 4 mmol/L (ref 3.5–5.1)
SODIUM: 137 mmol/L (ref 135–145)

## 2017-09-01 LAB — IRON AND TIBC
Iron: 54 ug/dL (ref 28–170)
Saturation Ratios: 21 % (ref 10.4–31.8)
TIBC: 259 ug/dL (ref 250–450)
UIBC: 205 ug/dL

## 2017-09-01 LAB — FERRITIN: Ferritin: 226 ng/mL (ref 11–307)

## 2017-09-01 LAB — POCT HEMOGLOBIN-HEMACUE: HEMOGLOBIN: 10.8 g/dL — AB (ref 12.0–15.0)

## 2017-09-01 MED ORDER — DARBEPOETIN ALFA 150 MCG/0.3ML IJ SOSY
PREFILLED_SYRINGE | INTRAMUSCULAR | Status: AC
  Administered 2017-09-01: 150 ug via SUBCUTANEOUS
  Filled 2017-09-01: qty 0.3

## 2017-09-01 MED ORDER — DARBEPOETIN ALFA 150 MCG/0.3ML IJ SOSY
150.0000 ug | PREFILLED_SYRINGE | INTRAMUSCULAR | Status: DC
  Administered 2017-09-01: 150 ug via SUBCUTANEOUS

## 2017-09-06 ENCOUNTER — Ambulatory Visit: Payer: Medicaid Other | Attending: Internal Medicine | Admitting: Internal Medicine

## 2017-09-06 ENCOUNTER — Encounter: Payer: Self-pay | Admitting: Internal Medicine

## 2017-09-06 ENCOUNTER — Other Ambulatory Visit: Payer: Self-pay | Admitting: Physician Assistant

## 2017-09-06 VITALS — BP 174/77 | HR 70 | Resp 16 | Ht 64.0 in | Wt 181.6 lb

## 2017-09-06 DIAGNOSIS — D509 Iron deficiency anemia, unspecified: Secondary | ICD-10-CM | POA: Diagnosis not present

## 2017-09-06 DIAGNOSIS — E1122 Type 2 diabetes mellitus with diabetic chronic kidney disease: Secondary | ICD-10-CM | POA: Insufficient documentation

## 2017-09-06 DIAGNOSIS — I429 Cardiomyopathy, unspecified: Secondary | ICD-10-CM | POA: Insufficient documentation

## 2017-09-06 DIAGNOSIS — F419 Anxiety disorder, unspecified: Secondary | ICD-10-CM | POA: Insufficient documentation

## 2017-09-06 DIAGNOSIS — Z882 Allergy status to sulfonamides status: Secondary | ICD-10-CM | POA: Insufficient documentation

## 2017-09-06 DIAGNOSIS — N184 Chronic kidney disease, stage 4 (severe): Secondary | ICD-10-CM | POA: Diagnosis not present

## 2017-09-06 DIAGNOSIS — Z885 Allergy status to narcotic agent status: Secondary | ICD-10-CM | POA: Insufficient documentation

## 2017-09-06 DIAGNOSIS — Z7982 Long term (current) use of aspirin: Secondary | ICD-10-CM | POA: Insufficient documentation

## 2017-09-06 DIAGNOSIS — I10 Essential (primary) hypertension: Secondary | ICD-10-CM

## 2017-09-06 DIAGNOSIS — E118 Type 2 diabetes mellitus with unspecified complications: Secondary | ICD-10-CM | POA: Diagnosis not present

## 2017-09-06 DIAGNOSIS — Z833 Family history of diabetes mellitus: Secondary | ICD-10-CM | POA: Insufficient documentation

## 2017-09-06 DIAGNOSIS — E785 Hyperlipidemia, unspecified: Secondary | ICD-10-CM | POA: Insufficient documentation

## 2017-09-06 DIAGNOSIS — E113319 Type 2 diabetes mellitus with moderate nonproliferative diabetic retinopathy with macular edema, unspecified eye: Secondary | ICD-10-CM | POA: Insufficient documentation

## 2017-09-06 DIAGNOSIS — Z79899 Other long term (current) drug therapy: Secondary | ICD-10-CM | POA: Insufficient documentation

## 2017-09-06 DIAGNOSIS — F329 Major depressive disorder, single episode, unspecified: Secondary | ICD-10-CM | POA: Diagnosis not present

## 2017-09-06 DIAGNOSIS — Z841 Family history of disorders of kidney and ureter: Secondary | ICD-10-CM | POA: Insufficient documentation

## 2017-09-06 DIAGNOSIS — I5020 Unspecified systolic (congestive) heart failure: Secondary | ICD-10-CM | POA: Insufficient documentation

## 2017-09-06 DIAGNOSIS — D171 Benign lipomatous neoplasm of skin and subcutaneous tissue of trunk: Secondary | ICD-10-CM | POA: Diagnosis not present

## 2017-09-06 DIAGNOSIS — Z8051 Family history of malignant neoplasm of kidney: Secondary | ICD-10-CM | POA: Insufficient documentation

## 2017-09-06 DIAGNOSIS — Z888 Allergy status to other drugs, medicaments and biological substances status: Secondary | ICD-10-CM | POA: Insufficient documentation

## 2017-09-06 DIAGNOSIS — E1140 Type 2 diabetes mellitus with diabetic neuropathy, unspecified: Secondary | ICD-10-CM | POA: Insufficient documentation

## 2017-09-06 DIAGNOSIS — I130 Hypertensive heart and chronic kidney disease with heart failure and stage 1 through stage 4 chronic kidney disease, or unspecified chronic kidney disease: Secondary | ICD-10-CM | POA: Diagnosis not present

## 2017-09-06 DIAGNOSIS — Z90710 Acquired absence of both cervix and uterus: Secondary | ICD-10-CM | POA: Insufficient documentation

## 2017-09-06 DIAGNOSIS — D638 Anemia in other chronic diseases classified elsewhere: Secondary | ICD-10-CM | POA: Insufficient documentation

## 2017-09-06 DIAGNOSIS — Z794 Long term (current) use of insulin: Secondary | ICD-10-CM | POA: Diagnosis not present

## 2017-09-06 LAB — POCT GLYCOSYLATED HEMOGLOBIN (HGB A1C): HEMOGLOBIN A1C: 7.3

## 2017-09-06 LAB — GLUCOSE, POCT (MANUAL RESULT ENTRY): POC GLUCOSE: 93 mg/dL (ref 70–99)

## 2017-09-06 MED ORDER — FUROSEMIDE 40 MG PO TABS: 40.0000 mg | ORAL_TABLET | Freq: Two times a day (BID) | ORAL | 1 refills | Status: DC

## 2017-09-06 MED FILL — FUROSEMIDE 40 MG TAB: 40 | 30 days supply | Qty: 60 | Fill #0

## 2017-09-06 NOTE — Progress Notes (Signed)
Patient ID: Patricia [name], female    DOB: 1955-12-24  MRN: [id number]  CC: No chief complaint on file.   Subjective: Patricia [name] is a 61 y.o. female who presents for chronic ds management Her concerns today include:  Patient with history of diabetes type 2 with retinopathy,neuropathyand nephropathy (CKD stage 4),systolic CHF with EF improved from 25-30% to 40% as of 04/2017 possible Takotsubo variant, mixed anemia ACDand IDA, HL and depression.  1. HTN:  -Hydralazine inc to 100 mg TID by clinical pharm.  Did not take as yet for the morning because she ran out yesterday.  Plans to fill today. -Lasix inc to 40 mg BID by neph today.  -checking BP BID. SBP in 170s, DBP 80-90s.  -limits salt -no CP/SOB. LE edema only if she does a lot of standing  2. ACD/IDA: still getting Aranesp once a mth. Iron d/c by nephrologist Hb 10.5 on last check 1 wk ago by neph  3. DM:  -checks BS  once a day. Gives range 80-110 in a.m -eating habits: doing good but over did it at thanksgiving  4. Has a mass RT flank x 2 yrs. Not painful but has inc in size and itches at times.  Patient Active Problem List   Diagnosis Date Noted  . Systolic CHF ([place]) 25/00/3704  . Diabetes mellitus type 2 with complications ([place]) 88/89/1694  . Transaminasemia 11/29/2016  . Cardiomyopathy ([place]) 11/12/2016  . Lipoma 10/21/2016  . CKD (chronic kidney disease) stage 4, GFR 15-29 ml/min ([facility]) 08/10/2016  . Anxiety and depression 12/12/2015  . Diabetic nephropathy associated with type 2 diabetes mellitus ([place]) 11/15/2014  . Moderate nonproliferative diabetic retinopathy(362.05) 04/17/2014  . 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 Essential hypertension 03/28/2014  . Dental caries 03/28/2014  . Dyslipidemia 04/18/2013     Current Outpatient Medications on File Prior to Visit  Medication Sig Dispense Refill  . aspirin EC 81 MG tablet Take 1 tablet (81 mg total) by mouth daily. 90 tablet 3  . atorvastatin  (LIPITOR) 20 MG tablet Take 0.5 tablets (10 mg total) by mouth daily. 90 tablet 3  . bismuth subsalicylate (PEPTO BISMOL) 262 MG/15ML suspension Take 30 mLs by mouth every 6 (six) hours as needed (as needed).    . Blood Pressure Monitoring (BLOOD PRESSURE KIT) DEVI 1 application by Does not apply route daily. Use as directed daily 1 Device 0  . carvedilol (COREG) 25 MG tablet Take 1 tablet (25 mg total) by mouth 2 (two) times daily with a meal. 180 tablet 3  . escitalopram (LEXAPRO) 20 MG tablet Take 1 tablet (20 mg total) by mouth at bedtime. 30 tablet 5  . furosemide (LASIX) 40 MG tablet Take 1 tablet (40 mg total) by mouth daily. 90 tablet 1  . gabapentin (NEURONTIN) 100 MG capsule Take 1 capsule (100 mg total) by mouth at bedtime. 90 capsule 3  . glipiZIDE (GLUCOTROL) 10 MG tablet TAKE 1 TABLET BY MOUTH 2 TIMES DAILY BEFORE A MEAL. 180 tablet 3  . glucose blood (TRUE METRIX BLOOD GLUCOSE TEST) test strip Use as instructed 100 each 12  . hydrALAZINE (APRESOLINE) 100 MG tablet Take 1 tablet (100 mg total) by mouth 2 (two) times daily. 60 tablet 2  . insulin aspart (NOVOLOG) 100 UNIT/ML injection 3 units before meals. 60 mL 3  . Insulin Glargine (LANTUS SOLOSTAR) 100 UNIT/ML Solostar Pen Inject 27 Units into the skin 2 (two) times daily. 90 mL 0  . Insulin Pen Needle  29G X 12.7MM MISC 10 Units by Does not apply route 1 day or 1 dose. 100 each 11  . Insulin Syringe-Needle U-100 30G X 5/16" 1 ML MISC Test 3 times daily 100 each 11  . isosorbide mononitrate (IMDUR) 60 MG 24 hr tablet Take 1 tablet (60 mg total) by mouth daily. 90 tablet 3  . lisinopril (PRINIVIL,ZESTRIL) 2.5 MG tablet Take 2.5 mg by mouth daily.    . TRUEPLUS LANCETS 28G MISC Use as directed 100 each 12  . [DISCONTINUED] quinapril (ACCUPRIL) 5 MG tablet Take by mouth at bedtime.     Current Facility-Administered Medications on File Prior to Visit  Medication Dose Route Frequency Provider Last Rate Last Dose  . 0.9 %  sodium  chloride infusion  500 mL Intravenous Continuous Doran Stabler, MD        Allergies  Allergen Reactions  . Codone [Hydrocodone] Itching  . Norvasc [Amlodipine Besylate] Swelling    Lower extremity?  . Sulfa Antibiotics Itching and Rash    Social History   Socioeconomic History  . Marital status: Divorced    Spouse name: Not on file  . Number of children: 2  . Years of education: Not on file  . Highest education level: Not on file  Social Needs  . Financial resource strain: Not on file  . Food insecurity - worry: Not on file  . Food insecurity - inability: Not on file  . Transportation needs - medical: Not on file  . Transportation needs - non-medical: Not on file  Occupational History  . Occupation: disabled  Tobacco Use  . Smoking status: Never Smoker  . Smokeless tobacco: Never Used  Substance and Sexual Activity  . Alcohol use: No  . Drug use: No  . Sexual activity: Not Currently    Birth control/protection: Surgical  Other Topics Concern  . Not on file  Social History Narrative  . Not on file    Family History  Problem Relation Age of Onset  . Diabetes Brother   . Stomach cancer Brother   . Kidney disease Brother   . Heart Problems Maternal Grandmother        pacemaker  . Heart disease Maternal Grandfather   . Rectal cancer Neg Hx   . Esophageal cancer Neg Hx   . Liver cancer Neg Hx   . Colon cancer Neg Hx     Past Surgical History:  Procedure Laterality Date  . ABDOMINAL HYSTERECTOMY    . BREAST SURGERY     reduction  . [place]  . REDUCTION MAMMAPLASTY Bilateral     ROS: Review of Systems Neg except as above  PHYSICAL EXAM: BP (!) 174/77   Pulse 70   Resp 16   Ht _0  (1.626 m)   Wt 181 lb 9.6 oz (82.4 kg)   SpO2 99%   BMI 31.17 kg/m   Wt Readings from Last 3 Encounters:  09/06/17 181 lb 9.6 oz (82.4 kg)  07/06/17 178 lb (80.7 kg)  06/17/17 177 lb (80.3 kg)   Physical Exam General appearance - alert,  well appearing, and in no distress Mental status - alert, oriented to person, place, and time, normal mood, behavior, speech, dress, motor activity, and thought processes Mouth - mucous membranes moist, pharynx normal without lesions Neck - supple, no significant adenopathy Chest - clear to auscultation, no wheezes, rales or rhonchi, symmetric air entry Heart - normal rate, regular rhythm, normal S1, S2, no murmurs, rubs,  clicks or gallops Extremities -no LE edema Skin: 8x5 cm raised firm, movable mass RT flank  Results for orders placed or performed in visit on 09/06/17  POCT A1C  Result Value Ref Range   Hemoglobin A1C 7.3   Glucose (CBG)  Result Value Ref Range   POC Glucose 93 70 - 99 mg/dl   Lab Results  Component Value Date   HGBA1C 7.3 09/06/2017    ASSESSMENT AND PLAN: 1. Type 2 diabetes mellitus with complication, with long-term current use of insulin ([facility]) -improved. -cont Glucotrol, novolog and Lantus Be mindful of portion sizes over the holidays - POCT A1C - Glucose (CBG)  2. Essential hypertension -not at goal I have not made any changes to her medicines today as she has been out of hydralazine and plans to fill today.  She also saw a nephrologist today and furosemide was increased to twice daily dosing    3. CKD stage 4 due to type 2 diabetes mellitus ([place]) Followed by nephrology  4. Lipoma of back - Ambulatory referral to General Surgery  5. Iron deficiency anemia, unspecified iron deficiency anemia type -And ACD -On Aranesp with good response  6. Systolic congestive heart failure, unspecified HF chronicity ([place]) Followed by cardiology and has follow-up appointment next month. Concern that she may have multivessel CAD but limited in doing cath due to CKD Continue carvedilol, lisinopril, isosorbide and Hydralazine   Patient was given the opportunity to ask questions.  Patient verbalized understanding of the plan and was able to repeat key elements of the  plan.   Orders Placed This Encounter  Procedures  . POCT A1C  . Glucose (CBG)     Requested Prescriptions    No prescriptions requested or ordered in this encounter    F/u in 3 mths Karle Plumber, MD, Rosalita Chessman

## 2017-09-06 NOTE — Patient Instructions (Signed)
Be mindful of your portion sizes over the holidays.   You have been referred to a general surgeon.

## 2017-09-13 MED FILL — LISINOPRIL 5 MG TAB: 5 | 30 days supply | Qty: 15 | Fill #7

## 2017-09-13 MED FILL — glipiZIDE 10 MG TABS: 10 | 30 days supply | Qty: 60 | Fill #8

## 2017-09-13 MED FILL — ISOSORBIDE MN ER 60 MG TAB: 60 | 30 days supply | Qty: 30 | Fill #2

## 2017-09-21 ENCOUNTER — Ambulatory Visit: Payer: Medicaid Other | Admitting: Cardiovascular Disease

## 2017-09-28 MED FILL — GABAPENTIN 100 MG CAPSULE: 100 | 30 days supply | Qty: 30 | Fill #8

## 2017-09-28 MED FILL — ESCITALOPRAM 20 MG TABLET: 20 | 30 days supply | Qty: 30 | Fill #4

## 2017-09-28 MED FILL — ATORVASTATIN 20 MG TABLET: 20 | 30 days supply | Qty: 15 | Fill #8

## 2017-09-29 ENCOUNTER — Encounter (HOSPITAL_COMMUNITY)
Admission: RE | Admit: 2017-09-29 | Discharge: 2017-09-29 | Disposition: A | Discharge status: Home / Self Care | Payer: Medicaid Other | Source: Ambulatory Visit | Attending: Nephrology | Admitting: Nephrology

## 2017-09-29 VITALS — BP 171/80 | HR 84 | Temp 97.7°F | Resp 18

## 2017-09-29 DIAGNOSIS — N184 Chronic kidney disease, stage 4 (severe): Secondary | ICD-10-CM

## 2017-09-29 DIAGNOSIS — D631 Anemia in chronic kidney disease: Secondary | ICD-10-CM | POA: Diagnosis present

## 2017-09-29 LAB — RENAL FUNCTION PANEL
ALBUMIN: 2.7 g/dL — AB (ref 3.5–5.0)
Anion gap: 7 (ref 5–15)
BUN: 68 mg/dL — AB (ref 6–20)
CHLORIDE: 106 mmol/L (ref 101–111)
CO2: 19 mmol/L — ABNORMAL LOW (ref 22–32)
CREATININE: 3.83 mg/dL — AB (ref 0.44–1.00)
Calcium: 7.9 mg/dL — ABNORMAL LOW (ref 8.9–10.3)
GFR calc Af Amer: 14 mL/min — ABNORMAL LOW (ref >60)
GFR, EST NON AFRICAN AMERICAN: 12 mL/min — AB (ref >60)
GLUCOSE: 173 mg/dL — AB (ref 65–99)
PHOSPHORUS: 5 mg/dL — AB (ref 2.5–4.6)
Potassium: 4.3 mmol/L (ref 3.5–5.1)
Sodium: 132 mmol/L — ABNORMAL LOW (ref 135–145)

## 2017-09-29 LAB — IRON AND TIBC
Iron: 69 ug/dL (ref 28–170)
SATURATION RATIOS: 27 % (ref 10.4–31.8)
TIBC: 258 ug/dL (ref 250–450)
UIBC: 189 ug/dL

## 2017-09-29 LAB — POCT HEMOGLOBIN-HEMACUE: HEMOGLOBIN: 10.7 g/dL — AB (ref 12.0–15.0)

## 2017-09-29 LAB — FERRITIN: Ferritin: 235 ng/mL (ref 11–307)

## 2017-09-29 MED ORDER — DARBEPOETIN ALFA 150 MCG/0.3ML IJ SOSY
150.0000 ug | PREFILLED_SYRINGE | INTRAMUSCULAR | Status: DC
  Administered 2017-09-29: 150 ug via SUBCUTANEOUS

## 2017-09-29 MED ORDER — DARBEPOETIN ALFA 150 MCG/0.3ML IJ SOSY
PREFILLED_SYRINGE | INTRAMUSCULAR | Status: AC
  Filled 2017-09-29: qty 0.3

## 2017-09-30 MED FILL — LANTUS SOLOSTAR 100 UNITS/M: 100 | 25 days supply | Qty: 15 | Fill #4

## 2017-10-18 MED FILL — glipiZIDE 10 MG TABS: 10 | 30 days supply | Qty: 60 | Fill #9

## 2017-10-18 MED FILL — LISINOPRIL 5 MG TAB: 5 | 30 days supply | Qty: 15 | Fill #8

## 2017-10-18 MED FILL — FUROSEMIDE 40 MG TAB: 40 | 30 days supply | Qty: 60 | Fill #1

## 2017-10-18 MED FILL — ISOSORBIDE MN ER 60 MG TAB: 60 | 30 days supply | Qty: 30 | Fill #3

## 2017-10-19 ENCOUNTER — Ambulatory Visit: Payer: Medicaid Other | Admitting: Cardiovascular Disease

## 2017-10-23 ENCOUNTER — Encounter (HOSPITAL_COMMUNITY): Payer: Self-pay | Admitting: *Deleted

## 2017-10-23 ENCOUNTER — Other Ambulatory Visit: Payer: Self-pay

## 2017-10-23 ENCOUNTER — Ambulatory Visit (HOSPITAL_COMMUNITY)
Admission: EM | Admit: 2017-10-23 | Discharge: 2017-10-23 | Disposition: A | Discharge status: Home / Self Care | Payer: Medicaid Other | Attending: Family Medicine | Admitting: Family Medicine

## 2017-10-23 DIAGNOSIS — L0291 Cutaneous abscess, unspecified: Secondary | ICD-10-CM | POA: Diagnosis not present

## 2017-10-23 MED ORDER — LIDOCAINE-EPINEPHRINE 2 %-1:100000 IJ SOLN
5.0000 mL | Freq: Once | INTRAMUSCULAR | Status: AC
  Administered 2017-10-23: 5 mL via INTRADERMAL

## 2017-10-23 MED ORDER — DOXYCYCLINE HYCLATE 100 MG PO CAPS: 100.0000 mg | ORAL_CAPSULE | Freq: Two times a day (BID) | ORAL | 0 refills | Status: AC

## 2017-10-23 NOTE — ED Provider Notes (Signed)
[facility]    CSN: [id number] Arrival date & time: 10/23/17  1208     History   Chief Complaint Chief Complaint  Patient presents with  . Abscess    HPI Patricia [name] is a 62 y.o. female presenting with abscess to labia majora. States she has gotten similar swelling in the past and but have popped on their own followed by drainage. This one has not and has continue to increase in size and pain. Has tried warm compresses. Does admit to shaving in the area and states it started right after swelling. Denies dysuria, vaginal discharge. Denies fevers, nausea, vomiting, abdominal pain.   HPI  Past Medical History:  Diagnosis Date  . Allergy   . Anemia   . Anxiety   . Blood transfusion without reported diagnosis   . Cardiomyopathy ([place]) 11/2016   no ischemic eval due to CKD  . CHF (congestive heart failure) ([place])   . CKD (chronic kidney disease), stage IV ([place])   . Depression   . Diabetes mellitus without complication ([place])   . Hypertension   . Obesity     Patient Active Problem List   Diagnosis Date Noted  . Systolic CHF ([place]) 81/27/5170  . Diabetes mellitus type 2 with complications ([place]) 01/74/9449  . Transaminasemia 11/29/2016  . Cardiomyopathy ([place]) 11/12/2016  . Lipoma 10/21/2016  . CKD (chronic kidney disease) stage 4, GFR 15-29 ml/min (HCC) 08/10/2016  . Anxiety and depression 12/12/2015  . Diabetic nephropathy associated with type 2 diabetes mellitus ([place]) 11/15/2014  . Moderate nonproliferative diabetic retinopathy(362.05) 04/17/2014  . 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 Essential hypertension 03/28/2014  . Dental caries 03/28/2014  . Dyslipidemia 04/18/2013    Past Surgical History:  Procedure Laterality Date  . ABDOMINAL HYSTERECTOMY    . BREAST SURGERY     reduction  . [place]  . REDUCTION MAMMAPLASTY Bilateral     OB History    Gravida Para Term Preterm AB Living   2 2 2     2    SAB TAB Ectopic Multiple  Live Births                   Home Medications    Prior to Admission medications   Medication Sig Start Date End Date Taking? Authorizing Provider  aspirin EC 81 MG tablet Take 1 tablet (81 mg total) by mouth daily. 01/04/17  Yes Langeland, Dawn T, MD  atorvastatin (LIPITOR) 20 MG tablet Take 0.5 tablets (10 mg total) by mouth daily. 01/04/17  Yes Maren Reamer, MD  carvedilol (COREG) 25 MG tablet Take 1 tablet (25 mg total) by mouth 2 (two) times daily with a meal. 04/17/17  Yes Strader, [place] M, PA-C  escitalopram (LEXAPRO) 20 MG tablet Take 1 tablet (20 mg total) by mouth at bedtime. 05/21/17  Yes Ladell Pier, MD  furosemide (LASIX) 40 MG tablet Take 1 tablet (40 mg total) by mouth 2 (two) times daily. 09/06/17  Yes Ladell Pier, MD  gabapentin (NEURONTIN) 100 MG capsule Take 1 capsule (100 mg total) by mouth at bedtime. 09/29/16  Yes Langeland, Dawn T, MD  glipiZIDE (GLUCOTROL) 10 MG tablet TAKE 1 TABLET BY MOUTH 2 TIMES DAILY BEFORE A MEAL. 01/04/17  Yes Langeland, Dawn T, MD  hydrALAZINE (APRESOLINE) 100 MG tablet Take 1 tablet (100 mg total) by mouth 2 (two) times daily. 08/03/17  Yes Ladell Pier, MD  insulin aspart (NOVOLOG) 100 UNIT/ML injection 3  units before meals. 04/06/17  Yes Ladell Pier, MD  Insulin Glargine (LANTUS SOLOSTAR) 100 UNIT/ML Solostar Pen Inject 27 Units into the skin 2 (two) times daily. 07/08/17  Yes Tresa Garter, MD  isosorbide mononitrate (IMDUR) 60 MG 24 hr tablet Take 1 tablet (60 mg total) by mouth daily. 07/07/17  Yes Lelon Perla, MD  lisinopril (PRINIVIL,ZESTRIL) 2.5 MG tablet Take 2.5 mg by mouth daily.   Yes [provider]  bismuth subsalicylate (PEPTO BISMOL) 262 MG/15ML suspension Take 30 mLs by mouth every 6 (six) hours as needed (as needed).    [provider]  Blood Pressure Monitoring (BLOOD PRESSURE KIT) DEVI 1 application by Does not apply route daily. Use as directed daily 07/08/17   Tresa Garter, MD  doxycycline (VIBRAMYCIN) 100 MG capsule Take 1 capsule (100 mg total) by mouth 2 (two) times daily for 10 days. 10/23/17 11/02/17  Ayriel Texidor C, PA-C  glucose blood (TRUE METRIX BLOOD GLUCOSE TEST) test strip Use as instructed 01/04/17   Lottie Mussel T, MD  Insulin Pen Needle 29G X 12.7MM MISC 10 Units by Does not apply route 1 day or 1 dose. 01/04/17   Maren Reamer, MD  Insulin Syringe-Needle U-100 30G X 5/16" 1 ML MISC Test 3 times daily 01/04/17   Maren Reamer, MD  TRUEPLUS LANCETS 28G MISC Use as directed 01/04/17   Maren Reamer, MD    Family History Family History  Problem Relation Age of Onset  . Diabetes Brother   . Stomach cancer Brother   . Kidney disease Brother   . Heart Problems Maternal Grandmother        pacemaker  . Heart disease Maternal Grandfather   . Rectal cancer Neg Hx   . Esophageal cancer Neg Hx   . Liver cancer Neg Hx   . Colon cancer Neg Hx     Social History Social History   Tobacco Use  . Smoking status: Never Smoker  . Smokeless tobacco: Never Used  Substance Use Topics  . Alcohol use: No  . Drug use: No     Allergies   Codone [hydrocodone]; Norvasc [amlodipine besylate]; and Sulfa antibiotics   Review of Systems Review of Systems  Constitutional: Negative for chills, fatigue and fever.  Gastrointestinal: Negative for abdominal pain, nausea and vomiting.  Genitourinary: Negative for dysuria, frequency and vaginal discharge.       Labia swelling  Skin: Negative for rash.  Neurological: Negative for dizziness, light-headedness and headaches.     Physical Exam Triage Vital Signs ED Triage Vitals [10/23/17 1252]  Enc Vitals Group     BP (!) 188/75     Pulse Rate 89     Resp (!) 24     Temp 98.2 F (36.8 C)     Temp Source Oral     SpO2 99 %     Weight      Height      Head Circumference      Peak Flow      Pain Score      Pain Loc      Pain Edu?      Excl. in [place]?    No data  found.  Updated Vital Signs BP (!) 188/75 (BP Location: Left Arm) Comment: Notified RN, pt sts that she has not taken BP med today  Pulse 89   Temp 98.2 F (36.8 C) (Oral)   Resp (!) 24 Comment: notified RN  SpO2 99%  Physical Exam  Constitutional: She appears well-developed and well-nourished. No distress.  HENT:  Head: Normocephalic and atraumatic.  Eyes: Conjunctivae are normal.  Neck: Neck supple.  Cardiovascular: Normal rate and regular rhythm.  No murmur heard. Pulmonary/Chest: Effort normal and breath sounds normal. No respiratory distress.  Abdominal: Soft. There is no tenderness.  Genitourinary:  Genitourinary Comments: Moderate swelling and induration to posterior aspect of right labia majora. Significant tenderness to palpation. No tenderness to left labia or left vaginal opening. Mild tenderness to palpation to right vaginal opening, no induration or swelling. Speculum not performed.  Musculoskeletal: She exhibits no edema.  Neurological: She is alert.  Skin: Skin is warm and dry.  Psychiatric: She has a normal mood and affect.  Nursing note and vitals reviewed.    UC Treatments / Results  Labs (all labs ordered are listed, but only abnormal results are displayed) Labs Reviewed - No data to display  EKG  EKG Interpretation None       Radiology No results found.  Procedures Incision and Drainage Date/Time: 10/23/2017 2:02 PM Performed by: Arayna Illescas, Elesa Hacker, PA-C Authorized by: Robyn Haber, MD   Consent:    Consent obtained:  Verbal   Consent given by:  Patient   Risks discussed:  Bleeding, incomplete drainage and infection   Alternatives discussed:  Delayed treatment and observation Location:    Type:  Abscess   Location:  Anogenital   Anogenital location:  [place] Pre-procedure details:    Skin preparation:  Betadine Anesthesia (see MAR for exact dosages):    Anesthesia method:  Topical application and local infiltration   Topical  anesthesia: GeBaur's pain ease.   Local anesthetic:  Lidocaine 2% WITH epi Procedure type:    Complexity:  Simple Procedure details:    Incision types:  Stab incision and single straight   Incision depth:  Dermal   Scalpel blade:  11   Wound management:  Probed and deloculated   Drainage:  Bloody and purulent   Drainage amount:  Moderate   Wound treatment:  Wound left open   Packing materials:  None Post-procedure details:    Patient tolerance of procedure:  Tolerated well, no immediate complications    (including critical care time)  Medications Ordered in UC Medications  lidocaine-EPINEPHrine (XYLOCAINE W/EPI) 2 %-1:100000 (with pres) injection 5 mL (5 mLs Intradermal Given 10/23/17 1354)     Initial Impression / Assessment and Plan / UC Course  I have reviewed the triage vital signs and the nursing notes.  Pertinent labs & imaging results that were available during my care of the patient were reviewed by me and considered in my medical decision making (see chart for details).     I&D performed in clinic with small-moderate amount of bloody pus drainage. Sent home with doxycycline x 10 days. Continue warm compresses and daily warm bath soaks with massage to help express anything else out. Likely related to shaving/folliculitis. Discussed strict return precautions. Patient verbalized understanding and is agreeable with plan.   Final Clinical Impressions(s) / UC Diagnoses   Final diagnoses:  Abscess    ED Discharge Orders        Ordered    doxycycline (VIBRAMYCIN) 100 MG capsule  2 times daily     10/23/17 1356       Controlled Substance Prescriptions [place] Controlled Substance Registry consulted? Not Applicable   Janith Lima, [place] 10/23/17 1429

## 2017-10-23 NOTE — ED Triage Notes (Signed)
Describes abscess to right labia x approx 1 wk without fevers.  Reports having abscesses in past.

## 2017-10-23 NOTE — Discharge Instructions (Signed)
Please take antibiotic twice daily for 10 days.   Please continue warm compresses and daily warm bath soaks with massage to help express anything else out.  Please return if symptoms persist, you develop fevers, increased swelling and redness in the area or persistent drainage.  Abscess likely came after shaving and infection developed around a hair follicle, please refrain from shaving for a couple weeks.  If you are experiencing these frequently and without shaving you may need to follow up with dermatology or surgery to help manage this.

## 2017-10-24 ENCOUNTER — Other Ambulatory Visit: Payer: Self-pay | Admitting: Internal Medicine

## 2017-10-26 ENCOUNTER — Encounter (HOSPITAL_COMMUNITY)
Admission: RE | Admit: 2017-10-26 | Discharge: 2017-10-26 | Disposition: A | Discharge status: Home / Self Care | Payer: Medicaid Other | Source: Ambulatory Visit | Attending: Nephrology | Admitting: Nephrology

## 2017-10-26 VITALS — BP 176/65 | HR 76 | Temp 98.0°F | Resp 18

## 2017-10-26 DIAGNOSIS — D631 Anemia in chronic kidney disease: Secondary | ICD-10-CM | POA: Insufficient documentation

## 2017-10-26 DIAGNOSIS — N184 Chronic kidney disease, stage 4 (severe): Secondary | ICD-10-CM

## 2017-10-26 LAB — RENAL FUNCTION PANEL
ALBUMIN: 2.4 g/dL — AB (ref 3.5–5.0)
Anion gap: 9 (ref 5–15)
BUN: 67 mg/dL — AB (ref 6–20)
CALCIUM: 8.3 mg/dL — AB (ref 8.9–10.3)
CO2: 20 mmol/L — ABNORMAL LOW (ref 22–32)
Chloride: 109 mmol/L (ref 101–111)
Creatinine, Ser: 3.78 mg/dL — ABNORMAL HIGH (ref 0.44–1.00)
GFR calc Af Amer: 14 mL/min — ABNORMAL LOW (ref >60)
GFR, EST NON AFRICAN AMERICAN: 12 mL/min — AB (ref >60)
GLUCOSE: 154 mg/dL — AB (ref 65–99)
PHOSPHORUS: 5 mg/dL — AB (ref 2.5–4.6)
Potassium: 3.7 mmol/L (ref 3.5–5.1)
SODIUM: 138 mmol/L (ref 135–145)

## 2017-10-26 LAB — FERRITIN: Ferritin: 240 ng/mL (ref 11–307)

## 2017-10-26 LAB — IRON AND TIBC
Iron: 63 ug/dL (ref 28–170)
SATURATION RATIOS: 24 % (ref 10.4–31.8)
TIBC: 266 ug/dL (ref 250–450)
UIBC: 203 ug/dL

## 2017-10-26 LAB — POCT HEMOGLOBIN-HEMACUE: HEMOGLOBIN: 9.3 g/dL — AB (ref 12.0–15.0)

## 2017-10-26 MED ORDER — DARBEPOETIN ALFA 150 MCG/0.3ML IJ SOSY
PREFILLED_SYRINGE | INTRAMUSCULAR | Status: AC
  Filled 2017-10-26: qty 0.3

## 2017-10-26 MED ORDER — DARBEPOETIN ALFA 150 MCG/0.3ML IJ SOSY
150.0000 ug | PREFILLED_SYRINGE | INTRAMUSCULAR | Status: DC
  Administered 2017-10-26: 10:00:00 150 ug via SUBCUTANEOUS

## 2017-10-26 MED FILL — hydrALAZINE HCL 50 MG TABS: 50 | 30 days supply | Qty: 90 | Fill #1

## 2017-11-03 MED FILL — ESCITALOPRAM 20 MG TABLET: 20 | 30 days supply | Qty: 30 | Fill #5

## 2017-11-03 MED FILL — ATORVASTATIN 20 MG TABLET: 20 | 30 days supply | Qty: 15 | Fill #9

## 2017-11-04 ENCOUNTER — Encounter (HOSPITAL_COMMUNITY): Payer: Self-pay

## 2017-11-04 ENCOUNTER — Emergency Department (HOSPITAL_COMMUNITY): Payer: Medicaid Other

## 2017-11-04 ENCOUNTER — Encounter: Payer: Self-pay | Admitting: Internal Medicine

## 2017-11-04 ENCOUNTER — Emergency Department (HOSPITAL_COMMUNITY)
Admission: EM | Admit: 2017-11-04 | Discharge: 2017-11-05 | Disposition: A | Discharge status: Home / Self Care | Payer: Medicaid Other | Attending: Emergency Medicine | Admitting: Emergency Medicine

## 2017-11-04 ENCOUNTER — Ambulatory Visit: Payer: Medicaid Other | Attending: Internal Medicine | Admitting: Internal Medicine

## 2017-11-04 ENCOUNTER — Other Ambulatory Visit: Payer: Self-pay

## 2017-11-04 VITALS — BP 147/88 | HR 81 | Temp 97.9°F | Resp 16 | Wt 176.6 lb

## 2017-11-04 DIAGNOSIS — I130 Hypertensive heart and chronic kidney disease with heart failure and stage 1 through stage 4 chronic kidney disease, or unspecified chronic kidney disease: Secondary | ICD-10-CM | POA: Diagnosis not present

## 2017-11-04 DIAGNOSIS — E860 Dehydration: Secondary | ICD-10-CM | POA: Diagnosis not present

## 2017-11-04 DIAGNOSIS — F329 Major depressive disorder, single episode, unspecified: Secondary | ICD-10-CM | POA: Diagnosis not present

## 2017-11-04 DIAGNOSIS — F419 Anxiety disorder, unspecified: Secondary | ICD-10-CM | POA: Diagnosis not present

## 2017-11-04 DIAGNOSIS — J111 Influenza due to unidentified influenza virus with other respiratory manifestations: Secondary | ICD-10-CM | POA: Insufficient documentation

## 2017-11-04 DIAGNOSIS — B348 Other viral infections of unspecified site: Secondary | ICD-10-CM | POA: Insufficient documentation

## 2017-11-04 DIAGNOSIS — Z794 Long term (current) use of insulin: Secondary | ICD-10-CM | POA: Diagnosis not present

## 2017-11-04 DIAGNOSIS — I129 Hypertensive chronic kidney disease with stage 1 through stage 4 chronic kidney disease, or unspecified chronic kidney disease: Secondary | ICD-10-CM | POA: Diagnosis not present

## 2017-11-04 DIAGNOSIS — E119 Type 2 diabetes mellitus without complications: Secondary | ICD-10-CM | POA: Diagnosis not present

## 2017-11-04 DIAGNOSIS — E113319 Type 2 diabetes mellitus with moderate nonproliferative diabetic retinopathy with macular edema, unspecified eye: Secondary | ICD-10-CM | POA: Diagnosis not present

## 2017-11-04 DIAGNOSIS — Z79899 Other long term (current) drug therapy: Secondary | ICD-10-CM | POA: Insufficient documentation

## 2017-11-04 DIAGNOSIS — E785 Hyperlipidemia, unspecified: Secondary | ICD-10-CM | POA: Insufficient documentation

## 2017-11-04 DIAGNOSIS — I509 Heart failure, unspecified: Secondary | ICD-10-CM | POA: Insufficient documentation

## 2017-11-04 DIAGNOSIS — E11649 Type 2 diabetes mellitus with hypoglycemia without coma: Secondary | ICD-10-CM | POA: Diagnosis not present

## 2017-11-04 DIAGNOSIS — E1121 Type 2 diabetes mellitus with diabetic nephropathy: Secondary | ICD-10-CM | POA: Insufficient documentation

## 2017-11-04 DIAGNOSIS — R509 Fever, unspecified: Secondary | ICD-10-CM | POA: Diagnosis present

## 2017-11-04 DIAGNOSIS — R42 Dizziness and giddiness: Secondary | ICD-10-CM | POA: Diagnosis not present

## 2017-11-04 DIAGNOSIS — B9789 Other viral agents as the cause of diseases classified elsewhere: Secondary | ICD-10-CM | POA: Diagnosis not present

## 2017-11-04 DIAGNOSIS — R69 Illness, unspecified: Secondary | ICD-10-CM

## 2017-11-04 DIAGNOSIS — J988 Other specified respiratory disorders: Secondary | ICD-10-CM

## 2017-11-04 DIAGNOSIS — Z7982 Long term (current) use of aspirin: Secondary | ICD-10-CM | POA: Diagnosis not present

## 2017-11-04 DIAGNOSIS — R05 Cough: Secondary | ICD-10-CM | POA: Diagnosis present

## 2017-11-04 DIAGNOSIS — N184 Chronic kidney disease, stage 4 (severe): Secondary | ICD-10-CM | POA: Diagnosis not present

## 2017-11-04 DIAGNOSIS — E162 Hypoglycemia, unspecified: Secondary | ICD-10-CM | POA: Diagnosis not present

## 2017-11-04 DIAGNOSIS — I429 Cardiomyopathy, unspecified: Secondary | ICD-10-CM | POA: Insufficient documentation

## 2017-11-04 DIAGNOSIS — E1122 Type 2 diabetes mellitus with diabetic chronic kidney disease: Secondary | ICD-10-CM | POA: Diagnosis not present

## 2017-11-04 LAB — CBC
HCT: 32 % — ABNORMAL LOW (ref 36.0–46.0)
Hemoglobin: 10.4 g/dL — ABNORMAL LOW (ref 12.0–15.0)
MCH: 29.1 pg (ref 26.0–34.0)
MCHC: 32.5 g/dL (ref 30.0–36.0)
MCV: 89.4 fL (ref 78.0–100.0)
PLATELETS: 267 10*3/uL (ref 150–400)
RBC: 3.58 MIL/uL — ABNORMAL LOW (ref 3.87–5.11)
RDW: 16.5 % — AB (ref 11.5–15.5)
WBC: 5.8 10*3/uL (ref 4.0–10.5)

## 2017-11-04 LAB — BASIC METABOLIC PANEL
Anion gap: 10 (ref 5–15)
BUN: 53 mg/dL — AB (ref 6–20)
CALCIUM: 8.4 mg/dL — AB (ref 8.9–10.3)
CO2: 19 mmol/L — AB (ref 22–32)
CREATININE: 4.09 mg/dL — AB (ref 0.44–1.00)
Chloride: 108 mmol/L (ref 101–111)
GFR calc non Af Amer: 11 mL/min — ABNORMAL LOW (ref >60)
GFR, EST AFRICAN AMERICAN: 13 mL/min — AB (ref >60)
Glucose, Bld: 89 mg/dL (ref 65–99)
Potassium: 4.2 mmol/L (ref 3.5–5.1)
SODIUM: 137 mmol/L (ref 135–145)

## 2017-11-04 LAB — GLUCOSE, POCT (MANUAL RESULT ENTRY)
POC GLUCOSE: 85 mg/dL (ref 70–99)
POC Glucose: 61 mg/dl — AB (ref 70–99)

## 2017-11-04 MED ORDER — INSTA-GLUCOSE 77.4 % PO GEL: 1.0000 | Freq: Once | ORAL | Status: DC

## 2017-11-04 NOTE — Progress Notes (Signed)
Patient ID: RONASIA ISOLA, female    DOB: [birth year]  MRN: [id number]  CC: Influenza (Possible)   Subjective: Patricia [name] is a 62 y.o. female who presents for UC visit Her concerns today include:   C/o feeling bad x 1 wk Started as congestion, night sweat + cough productive of clear phlegm, SOB, + headache.  No sore throat Decrease appetite only able to drink juice, vomiting, gagging.  Dizzy with standing.  Some diarrhea yesterday.  Twice today Not checking BS Grandchild in home has flu.  She had flu shot Patient Active Problem List   Diagnosis Date Noted  . Systolic CHF ([place]) 67/09/4579  . Diabetes mellitus type 2 with complications ([place]) 99/83/3825  . Transaminasemia 11/29/2016  . Cardiomyopathy ([place]) 11/12/2016  . Lipoma 10/21/2016  . CKD (chronic kidney disease) stage 4, GFR 15-29 ml/min ([facility]) 08/10/2016  . Anxiety and depression 12/12/2015  . Diabetic nephropathy associated with type 2 diabetes mellitus ([place]) 11/15/2014  . Moderate nonproliferative diabetic retinopathy(362.05) 04/17/2014  . 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 Essential hypertension 03/28/2014  . Dental caries 03/28/2014  . Dyslipidemia 04/18/2013     Current Outpatient Medications on File Prior to Visit  Medication Sig Dispense Refill  . aspirin EC 81 MG tablet Take 1 tablet (81 mg total) by mouth daily. 90 tablet 3  . atorvastatin (LIPITOR) 20 MG tablet Take 0.5 tablets (10 mg total) by mouth daily. 90 tablet 3  . bismuth subsalicylate (PEPTO BISMOL) 262 MG/15ML suspension Take 30 mLs by mouth every 6 (six) hours as needed (as needed).    . Blood Pressure Monitoring (BLOOD PRESSURE KIT) DEVI 1 application by Does not apply route daily. Use as directed daily 1 Device 0  . carvedilol (COREG) 25 MG tablet Take 1 tablet (25 mg total) by mouth 2 (two) times daily with a meal. 180 tablet 3  . escitalopram (LEXAPRO) 20 MG tablet Take 1 tablet (20 mg total) by mouth at bedtime. 30 tablet 5  .  furosemide (LASIX) 40 MG tablet Take 1 tablet (40 mg total) by mouth 2 (two) times daily. 60 tablet 1  . gabapentin (NEURONTIN) 100 MG capsule Take 1 capsule (100 mg total) by mouth at bedtime. 90 capsule 3  . glipiZIDE (GLUCOTROL) 10 MG tablet TAKE 1 TABLET BY MOUTH 2 TIMES DAILY BEFORE A MEAL. 180 tablet 3  . glucose blood (TRUE METRIX BLOOD GLUCOSE TEST) test strip Use as instructed 100 each 12  . hydrALAZINE (APRESOLINE) 100 MG tablet TAKE 1 TABLET BY MOUTH TWICE DAILY 180 tablet 0  . insulin aspart (NOVOLOG) 100 UNIT/ML injection 3 units before meals. 60 mL 3  . Insulin Glargine (LANTUS SOLOSTAR) 100 UNIT/ML Solostar Pen Inject 27 Units into the skin 2 (two) times daily. 90 mL 0  . Insulin Pen Needle 29G X 12.7MM MISC 10 Units by Does not apply route 1 day or 1 dose. 100 each 11  . Insulin Syringe-Needle U-100 30G X 5/16" 1 ML MISC Test 3 times daily 100 each 11  . isosorbide mononitrate (IMDUR) 60 MG 24 hr tablet Take 1 tablet (60 mg total) by mouth daily. 90 tablet 3  . lisinopril (PRINIVIL,ZESTRIL) 2.5 MG tablet Take 2.5 mg by mouth daily.    . TRUEPLUS LANCETS 28G MISC Use as directed 100 each 12  . [DISCONTINUED] quinapril (ACCUPRIL) 5 MG tablet Take by mouth at bedtime.     Current Facility-Administered Medications on File Prior to Visit  Medication Dose Route Frequency Provider  Last Rate Last Dose  . 0.9 %  sodium chloride infusion  500 mL Intravenous Continuous Doran Stabler, MD        Allergies  Allergen Reactions  . Codone [Hydrocodone] Itching  . Norvasc [Amlodipine Besylate] Swelling    Lower extremity?  . Sulfa Antibiotics Itching and Rash    Social History   Socioeconomic History  . Marital status: Divorced    Spouse name: Not on file  . Number of children: 2  . Years of education: Not on file  . Highest education level: Not on file  Social Needs  . Financial resource strain: Not on file  . Food insecurity - worry: Not on file  . Food insecurity -  inability: Not on file  . Transportation needs - medical: Not on file  . Transportation needs - non-medical: Not on file  Occupational History  . Occupation: disabled  Tobacco Use  . Smoking status: Never Smoker  . Smokeless tobacco: Never Used  Substance and Sexual Activity  . Alcohol use: No  . Drug use: No  . Sexual activity: Not on file  Other Topics Concern  . Not on file  Social History Narrative  . Not on file    Family History  Problem Relation Age of Onset  . Diabetes Brother   . Stomach cancer Brother   . Kidney disease Brother   . Heart Problems Maternal Grandmother        pacemaker  . Heart disease Maternal Grandfather   . Rectal cancer Neg Hx   . Esophageal cancer Neg Hx   . Liver cancer Neg Hx   . Colon cancer Neg Hx     Past Surgical History:  Procedure Laterality Date  . ABDOMINAL HYSTERECTOMY    . BREAST SURGERY     reduction  . [place]  . REDUCTION MAMMAPLASTY Bilateral     ROS: Review of Systems As above PHYSICAL EXAM: BP (!) 147/88   Pulse 81   Temp 97.9 F (36.6 C) (Oral)   Resp 16   Wt 176 lb 9.6 oz (80.1 kg)   SpO2 96%   BMI 30.31 kg/m    BP sitting 160/80, standing 130/80 Physical Exam  General appearance -pt appears unwell Mental status -reaful.  Dry heaves Chest -clear BL Heart - normal rate, regular rhythm, normal S1, S2, no murmurs, rubs, clicks or gallops Abdomen - soft, nontender, nondistended, no masses or organomegaly Extremities - no LE edema  BS of 61  Repeat BP 85  ASSESSMENT AND PLAN: 1. Viral respiratory illness 2. Dehydration 3. Orthostatic lightheadedness -sent to ED for careful hydration.   4. Hypoglycemia  - INSTA-GLUCOSE 77.4 % GEL 1 Tube  Patient was given the opportunity to ask questions.  Patient verbalized understanding of the plan and was able to repeat key elements of the plan.   No orders of the defined types were placed in this encounter.    Requested  Prescriptions    No prescriptions requested or ordered in this encounter    No Follow-up on file.  Karle Plumber, MD, FACP

## 2017-11-04 NOTE — ED Provider Notes (Signed)
TIME SEEN: 11:46 PM  CHIEF COMPLAINT: Subjective fevers, nasal congestion, dry cough, nausea, diarrhea  HPI: Patient is a 62 year old female with history of cardiomyopathy, chronic kidney disease, diabetes, hypertension who presents to the emergency department for evaluation.  States she was seen here by her primary care physician states she was told she needed IV fluids.  She reports for the past 6 days she has had flulike symptoms.  Was told she had influenza but did not have confirmatory testing.  Has had a fevers, dry cough, nausea, intermittent diarrhea without blood or melena.  No vomiting.  No chest pain or shortness of breath.  Reports nasal congestion and body aches as well.  States that she has an appointment with her nephrologist tomorrow.  She states she has been eating and drinking well.  Taking Tylenol at home as needed.  ROS: See HPI Constitutional:  fever  Eyes: no drainage  ENT:  runny nose   Cardiovascular:  no chest pain  Resp: no SOB  GI: no vomiting GU: no dysuria Integumentary: no rash  Allergy: no hives  Musculoskeletal: no leg swelling  Neurological: no slurred speech ROS otherwise negative  PAST MEDICAL HISTORY/PAST SURGICAL HISTORY:  Past Medical History:  Diagnosis Date  . Allergy   . Anemia   . Anxiety   . Blood transfusion without reported diagnosis   . Cardiomyopathy ([place]) 11/2016   no ischemic eval due to CKD  . CHF (congestive heart failure) ([place])   . CKD (chronic kidney disease), stage IV ([place])   . Depression   . Diabetes mellitus without complication ([place])   . Hypertension   . Obesity     MEDICATIONS:  Prior to Admission medications   Medication Sig Start Date End Date Taking? Authorizing Provider  aspirin EC 81 MG tablet Take 1 tablet (81 mg total) by mouth daily. 01/04/17   Maren Reamer, MD  atorvastatin (LIPITOR) 20 MG tablet Take 0.5 tablets (10 mg total) by mouth daily. 01/04/17   Maren Reamer, MD  bismuth subsalicylate (PEPTO  BISMOL) 262 MG/15ML suspension Take 30 mLs by mouth every 6 (six) hours as needed (as needed).    [provider]  Blood Pressure Monitoring (BLOOD PRESSURE KIT) DEVI 1 application by Does not apply route daily. Use as directed daily 07/08/17   Tresa Garter, MD  carvedilol (COREG) 25 MG tablet Take 1 tablet (25 mg total) by mouth 2 (two) times daily with a meal. 04/17/17   Strader, [place] M, PA-C  escitalopram (LEXAPRO) 20 MG tablet Take 1 tablet (20 mg total) by mouth at bedtime. 05/21/17   Ladell Pier, MD  furosemide (LASIX) 40 MG tablet Take 1 tablet (40 mg total) by mouth 2 (two) times daily. 09/06/17   Ladell Pier, MD  gabapentin (NEURONTIN) 100 MG capsule Take 1 capsule (100 mg total) by mouth at bedtime. 09/29/16   Langeland, Dawn T, MD  glipiZIDE (GLUCOTROL) 10 MG tablet TAKE 1 TABLET BY MOUTH 2 TIMES DAILY BEFORE A MEAL. 01/04/17   Langeland, Dawn T, MD  glucose blood (TRUE METRIX BLOOD GLUCOSE TEST) test strip Use as instructed 01/04/17   Lottie Mussel T, MD  hydrALAZINE (APRESOLINE) 100 MG tablet TAKE 1 TABLET BY MOUTH TWICE DAILY 10/25/17   Ladell Pier, MD  insulin aspart (NOVOLOG) 100 UNIT/ML injection 3 units before meals. 04/06/17   Ladell Pier, MD  Insulin Glargine (LANTUS SOLOSTAR) 100 UNIT/ML Solostar Pen Inject 27 Units into the skin 2 (two)  times daily. 07/08/17   Tresa Garter, MD  Insulin Pen Needle 29G X 12.7MM MISC 10 Units by Does not apply route 1 day or 1 dose. 01/04/17   Maren Reamer, MD  Insulin Syringe-Needle U-100 30G X 5/16" 1 ML MISC Test 3 times daily 01/04/17   Lottie Mussel T, MD  isosorbide mononitrate (IMDUR) 60 MG 24 hr tablet Take 1 tablet (60 mg total) by mouth daily. 07/07/17   Lelon Perla, MD  lisinopril (PRINIVIL,ZESTRIL) 2.5 MG tablet Take 2.5 mg by mouth daily.    [provider]  TRUEPLUS LANCETS 28G MISC Use as directed 01/04/17   Maren Reamer, MD  quinapril (ACCUPRIL) 5 MG tablet  Take by mouth at bedtime.  02/08/13  [provider]    ALLERGIES:  Allergies  Allergen Reactions  . Codone [Hydrocodone] Itching  . Norvasc [Amlodipine Besylate] Swelling    Lower extremity?  . Sulfa Antibiotics Itching and Rash    SOCIAL HISTORY:  Social History   Tobacco Use  . Smoking status: Never Smoker  . Smokeless tobacco: Never Used  Substance Use Topics  . Alcohol use: No    FAMILY HISTORY: Family History  Problem Relation Age of Onset  . Diabetes Brother   . Stomach cancer Brother   . Kidney disease Brother   . Heart Problems Maternal Grandmother        pacemaker  . Heart disease Maternal Grandfather   . Rectal cancer Neg Hx   . Esophageal cancer Neg Hx   . Liver cancer Neg Hx   . Colon cancer Neg Hx     EXAM: BP (!) 184/89 (BP Location: Right Arm)   Pulse 86   Temp 98.4 F (36.9 C) (Oral)   Resp 18   SpO2 100%  CONSTITUTIONAL: Alert and oriented and responds appropriately to questions. Well-appearing; well-nourished HEAD: Normocephalic EYES: Conjunctivae clear, pupils appear equal, EOMI ENT: normal nose; moist mucous membranes NECK: Supple, no meningismus, no nuchal rigidity, no LAD  CARD: RRR; S1 and S2 appreciated; no murmurs, no clicks, no rubs, no gallops RESP: Normal chest excursion without splinting or tachypnea; breath sounds clear and equal bilaterally; no wheezes, no rhonchi, no rales, no hypoxia or respiratory distress, speaking full sentences ABD/GI: Normal bowel sounds; non-distended; soft, non-tender, no rebound, no guarding, no peritoneal signs, no hepatosplenomegaly BACK:  The back appears normal and is non-tender to palpation, there is no CVA tenderness EXT: Normal ROM in all joints; non-tender to palpation; no edema; normal capillary refill; no cyanosis, no calf tenderness or swelling    SKIN: Normal color for age and race; warm; no rash NEURO: Moves all extremities equally PSYCH: The patient's mood and manner are  appropriate. Grooming and personal hygiene are appropriate.  MEDICAL DECISION MAKING: Patient here with flulike symptoms.  She is afebrile here.  Labs show no acute abnormality.  She has chronic kidney disease which appears stable.  Slightly low bicarb but this is likely from uremia.  No leukocytosis.  Chest x-ray clear.  No sign of volume overload currently but given her history of cardiomyopathy I feel that she would be at risk getting IV fluids.  I feel she is able to tolerate oral fluids that this is appropriate.  She does not appear dry on exam.  I discussed this with patient and family at length and they are comfortable with discharge home without IV hydration.  I do not feel clinically that it is indicated at this time.  Nothing  at this time to suggest bacterial infection.  I do not feel she needs antibiotics.  I feel she is safe to be discharged.  Recommended Tylenol for fever and pain.  She will avoid NSAIDs.  She will follow-up with her nephrologist as scheduled tomorrow.  Discussed with her that Mucinex, Afrin nasal spray are safe for symptomatic relief.  Advised her to avoid over-the-counter decongestants given her history of hypertension.  At this time, I do not feel there is any life-threatening condition present. I have reviewed and discussed all results (EKG, imaging, lab, urine as appropriate) and exam findings with patient/family. I have reviewed nursing notes and appropriate previous records.  I feel the patient is safe to be discharged home without further emergent workup and can continue workup as an outpatient as needed. Discussed usual and customary return precautions. Patient/family verbalize understanding and are comfortable with this plan.  Outpatient follow-up has been provided if needed. All questions have been answered.      Ward, Delice Bison, DO 11/05/17 [id number]

## 2017-11-04 NOTE — ED Notes (Signed)
Lab work, radiology results and vital signs reviewed, no critical results at this time, no change in acuity indicated.  

## 2017-11-04 NOTE — ED Triage Notes (Signed)
Pt here for multiple complaints including, cough, night sweats, nausea. Pt states she is unable to eat due to feeling nauseous. Skin warm and dry, no distress noted. Pt appears to be fatigued. AOX4.

## 2017-11-04 NOTE — Discharge Instructions (Signed)
Your labs and chest x-ray today were reassuring.  Please increase your fluid intake at home, take Tylenol 1000 millgrams every 6 hours as needed for pain and fever.  You may use over-the-counter Mucinex for cough, chest congestion.  You may use nasal Afrin spray for nasal congestion.  Please do not use this medication for more than 3 days.  You may use over-the-counter Imodium as needed for diarrhea.

## 2017-11-04 NOTE — ED Notes (Signed)
ED Provider at bedside. 

## 2017-11-04 NOTE — Progress Notes (Signed)
Pt states she has been feeling down since last friday  Pt states she was taking alka seltzer plus cold and flu started feeling better and now she feels worse  Pt states she has 2 grandchildren at home with the flu  Pt states she doesn't have no appetite  Pt states her head and stomach hurts  Pt states she has a lot of congestion and pressure  Pt states she coughs a lot with clear phlegm  No sore throat  Pt states she feels light headed  Pt states she is very nausea   Pt states when she feels like she is going to throw up she just gag

## 2017-11-05 NOTE — ED Notes (Signed)
Pt left at this time with all belongings.  

## 2017-11-15 ENCOUNTER — Ambulatory Visit: Payer: Medicaid Other | Admitting: Physician Assistant

## 2017-11-15 ENCOUNTER — Encounter: Payer: Self-pay | Admitting: Physician Assistant

## 2017-11-15 VITALS — BP 127/70 | HR 78 | Ht 64.0 in | Wt 180.2 lb

## 2017-11-15 DIAGNOSIS — R9431 Abnormal electrocardiogram [ECG] [EKG]: Secondary | ICD-10-CM | POA: Diagnosis not present

## 2017-11-15 DIAGNOSIS — N184 Chronic kidney disease, stage 4 (severe): Secondary | ICD-10-CM | POA: Diagnosis not present

## 2017-11-15 DIAGNOSIS — I10 Essential (primary) hypertension: Secondary | ICD-10-CM | POA: Diagnosis not present

## 2017-11-15 DIAGNOSIS — I5022 Chronic systolic (congestive) heart failure: Secondary | ICD-10-CM | POA: Diagnosis not present

## 2017-11-15 NOTE — Progress Notes (Addendum)
Cardiology Office Note   Date:  11/15/2017   ID:  Patricia, [name] 10-Jun-1956, MRN [id number]  PCP:  Ladell Pier, MD  Cardiologist:  Dr Sallyanne Kuster 07/06/2017  Patricia Ferries, PA-C 01/18/2017  Chief Complaint  Patient presents with  . Follow-up    pt denied chest pain     History of Present Illness: Patricia [name] is a 62 y.o. female with a history of IDDM, HTN, CKD stage III, depression, anxiety and S-CHF. EF 25-30% 11/30/2016, no cath due to poor renal function, possible Takotsubo variant after the death of her sister, recheck in 3 months. MV 04/2017 w/ no infarct or ischemia, EF 40%  ER visit 01/24 for URI, diarrhea>>dx viral illness  Patricia [name] presents for cardiology follow up. She had an appt with Dr C during the snow, had to reschedule.   Her breathing is pretty good. She is still struggling with stairs. Had the flu for 2 weeks, is still getting over that.   Has anxiety about family issues.   Her weight is up a little today, but she has not been eating much. She feels nauseated in the mornings, has problems w/ gas at times as well.   Her sugars are about 90 in the morning, does not remember what her last A1c was.    She is a little frustrated that she does not feel well.   She keeps appts w/ Dr Joelyn Oms, is supposed to attend classes about HD.   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  She does not exercise regularly.  She denies orthopnea or PND.  The dyspnea on exertion has not changed recently.  She is not waking with lower extremity edema.   Past Medical History:  Diagnosis Date  . Allergy   . Anemia   . Anxiety   . Blood transfusion without reported diagnosis   . Cardiomyopathy ([place]) 11/2016   no ischemic eval due to CKD  . CHF (congestive heart failure) ([place])   . CKD (chronic kidney disease), stage IV ([place])   . Depression   . Diabetes mellitus without complication ([place])   . Hypertension   .  Obesity     Past Surgical History:  Procedure Laterality Date  . ABDOMINAL HYSTERECTOMY    . BREAST SURGERY     reduction  . [place]  . REDUCTION MAMMAPLASTY Bilateral     Current Outpatient Medications  Medication Sig Dispense Refill  . aspirin EC 81 MG tablet Take 1 tablet (81 mg total) by mouth daily. 90 tablet 3  . atorvastatin (LIPITOR) 20 MG tablet Take 0.5 tablets (10 mg total) by mouth daily. 90 tablet 3  . bismuth subsalicylate (PEPTO BISMOL) 262 MG/15ML suspension Take 30 mLs by mouth every 6 (six) hours as needed (as needed).    . Blood Pressure Monitoring (BLOOD PRESSURE KIT) DEVI 1 application by Does not apply route daily. Use as directed daily 1 Device 0  . carvedilol (COREG) 25 MG tablet Take 1 tablet (25 mg total) by mouth 2 (two) times daily with a meal. 180 tablet 3  . escitalopram (LEXAPRO) 20 MG tablet Take 1 tablet (20 mg total) by mouth at bedtime. 30 tablet 5  . furosemide (LASIX) 40 MG tablet Take 1 tablet (40 mg total) by mouth 2 (two) times daily. 60 tablet 1  . gabapentin (NEURONTIN) 100 MG capsule Take 1 capsule (100 mg total) by mouth  at bedtime. 90 capsule 3  . glipiZIDE (GLUCOTROL) 10 MG tablet TAKE 1 TABLET BY MOUTH 2 TIMES DAILY BEFORE A MEAL. 180 tablet 3  . glucose blood (TRUE METRIX BLOOD GLUCOSE TEST) test strip Use as instructed 100 each 12  . hydrALAZINE (APRESOLINE) 100 MG tablet TAKE 1 TABLET BY MOUTH TWICE DAILY 180 tablet 0  . insulin aspart (NOVOLOG) 100 UNIT/ML injection 3 units before meals. 60 mL 3  . Insulin Glargine (LANTUS SOLOSTAR) 100 UNIT/ML Solostar Pen Inject 27 Units into the skin 2 (two) times daily. 90 mL 0  . Insulin Pen Needle 29G X 12.7MM MISC 10 Units by Does not apply route 1 day or 1 dose. 100 each 11  . Insulin Syringe-Needle U-100 30G X 5/16" 1 ML MISC Test 3 times daily 100 each 11  . isosorbide mononitrate (IMDUR) 60 MG 24 hr tablet Take 1 tablet (60 mg total) by mouth daily. 90 tablet 3  .  lisinopril (PRINIVIL,ZESTRIL) 2.5 MG tablet Take 2.5 mg by mouth daily.    . TRUEPLUS LANCETS 28G MISC Use as directed 100 each 12   Current Facility-Administered Medications  Medication Dose Route Frequency Provider Last Rate Last Dose  . 0.9 %  sodium chloride infusion  500 mL Intravenous Continuous Danis, Estill Cotta III, MD      . INSTA-GLUCOSE 77.4 % GEL 1 Tube  1 Tube Oral Once Ladell Pier, MD        Allergies:   Codone [hydrocodone]; Norvasc [amlodipine besylate]; and Sulfa antibiotics    Social History:  The patient  reports that  has never smoked. she has never used smokeless tobacco. She reports that she does not drink alcohol or use drugs.   Family History:  The patient's family history includes Diabetes in her brother; Heart Problems in her maternal grandmother; Heart disease in her maternal grandfather; Kidney disease in her brother; Stomach cancer in her brother.    ROS:  Please see the history of present illness. All other systems are reviewed and negative.    PHYSICAL EXAM: VS:  BP 127/70   Pulse 78   Ht 5' 4"  (1.626 m)   Wt 180 lb 3.2 oz (81.7 kg)   BMI 30.93 kg/m  , BMI Body mass index is 30.93 kg/m. GEN: Well nourished, well developed, female in no acute distress  HEENT: normal for age  Neck: no JVD, no carotid bruit, no masses Cardiac: RRR; faint murmur, no rubs, or gallops Respiratory:  clear to auscultation bilaterally, normal work of breathing GI: soft, nontender, nondistended, + BS MS: no deformity or atrophy; no edema; distal pulses are 2+ in all 4 extremities   Skin: warm and dry, no rash Neuro:  Strength and sensation are intact Psych: euthymic mood, full affect   EKG:  EKG is ordered today. The ekg ordered today demonstrates sinus rhythm, heart rate 78, QT is 450   Recent Labs: 11/29/2016: ALT 62; B Natriuretic Peptide 1,295.3 11/04/2017: BUN 53; Creatinine, Ser 4.09; Hemoglobin 10.4; Platelets 267; Potassium 4.2; Sodium 137    Lipid  Panel    Component Value Date/Time   CHOL 144 12/01/2016 0616   TRIG 129 12/01/2016 0616   HDL 36 (L) 12/01/2016 0616   CHOLHDL 4.0 12/01/2016 0616   VLDL 26 12/01/2016 0616   LDLCALC 82 12/01/2016 0616     Wt Readings from Last 3 Encounters:  11/15/17 180 lb 3.2 oz (81.7 kg)  11/04/17 176 lb 9.6 oz (80.1 kg)  09/06/17 181 lb  9.6 oz (82.4 kg)     Other studies Reviewed: Additional studies/ records that were reviewed today include: office notes, Dr Adin Hector faxed records, testing.  ASSESSMENT AND PLAN:  1.  Chronic systolic CHF: Wt is generally stable, no significant CHF sx. Diuretic management per Dr Joelyn Oms. Continue BB, Imdur and ACE.  Dyspnea on exertion is at baseline.  2. CKD IV: When her labs were checked during her acute illness, Cr was a little higher than usual for her, 4.09. Keep f/u with Dr Joelyn Oms. He has her on a low dose of lisinopril.   3.  Hypertension: Her blood pressures under good control right now.  No med changes  4.  GI issues: She is encouraged to follow-up with her PCP for this, she is at risk to have diabetic gastroparesis and other issues that would affect her GI tract.  5.  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  I believe that her QT interval was under measured on the 11/04/2017 ECG.  However, it is definitely longer than it was in September 2018.  Although Lexapro is associated with prolonged QT, she has been on the same dose for a year.  She is asymptomatic from this.  **I will not make any medication changes, Dr. Sallyanne Kuster to review and advise if anything further needs to be done**  Current medicines are reviewed at length with the patient today.  The patient does not have concerns regarding medicines.  The following changes have been made:  no change  Labs/ tests ordered today include:  No orders of the defined types were placed in this encounter.    Disposition:   FU with Dr Sallyanne Kuster  Signed, Patricia Ferries, PA-C  11/15/2017 2:25 PM    [place] Phone: [phone]; Fax: [phone]  This note was written with the assistance of speech recognition software. Please excuse any transcriptional errors.

## 2017-11-15 NOTE — Patient Instructions (Signed)
Medication Instructions:   Your physician recommends that you continue on your current medications as directed. Please refer to the Current Medication list given to you today.    If you need a refill on your cardiac medications before your next appointment, please call your pharmacy.  Labwork: NONE ORDERED  TODAY    Testing/Procedures: NONE ORDERED  TODAY    Follow-Up: Your physician wants you to follow-up in:  IN [place] will receive a reminder letter in the mail two months in advance. If you don't receive a letter, please call our office to schedule the follow-up appointment.      Any Other Special Instructions Will Be Listed Below (If Applicable).

## 2017-11-18 ENCOUNTER — Other Ambulatory Visit: Payer: Self-pay | Admitting: Internal Medicine

## 2017-11-18 MED FILL — glipiZIDE 10 MG TABS: 10 | 30 days supply | Qty: 60 | Fill #10

## 2017-11-18 MED FILL — ISOSORBIDE MN ER 60 MG TAB: 60 | 30 days supply | Qty: 30 | Fill #4

## 2017-11-18 MED FILL — LISINOPRIL 5 MG TABLET: 5 | 30 days supply | Qty: 15 | Fill #9

## 2017-11-19 ENCOUNTER — Telehealth: Payer: Self-pay | Admitting: Pharmacist

## 2017-11-19 ENCOUNTER — Other Ambulatory Visit: Payer: Self-pay | Admitting: Internal Medicine

## 2017-11-19 MED ORDER — GABAPENTIN 100 MG PO CAPS: 100.0000 mg | ORAL_CAPSULE | Freq: Every day | ORAL | 3 refills | Status: DC

## 2017-11-19 MED FILL — GABAPENTIN 100 MG CAPSULE: 100 | 30 days supply | Qty: 30 | Fill #0

## 2017-11-19 MED FILL — hydrALAZINE HCL 50 MG TABS: 50 | 30 days supply | Qty: 90 | Fill #2

## 2017-11-19 NOTE — Addendum Note (Signed)
Addended by: Karle Plumber B on: 11/19/2017 12:17 PM   Modules accepted: Orders

## 2017-11-19 NOTE — Telephone Encounter (Signed)
Gabapentin RF done. Pt on low dose given her eGFR.

## 2017-11-19 NOTE — Telephone Encounter (Signed)
Received refill request for gabapentin. This was originally prescribed by Dr. Adrian Blackwater, would need approval by Dr. Wynetta Emery. Will forward to her.

## 2017-11-23 ENCOUNTER — Encounter (HOSPITAL_COMMUNITY)
Admission: RE | Admit: 2017-11-23 | Discharge: 2017-11-23 | Disposition: A | Discharge status: Home / Self Care | Payer: Medicaid Other | Source: Ambulatory Visit | Attending: Nephrology | Admitting: Nephrology

## 2017-11-23 VITALS — BP 170/74 | HR 87 | Resp 18

## 2017-11-23 DIAGNOSIS — D631 Anemia in chronic kidney disease: Secondary | ICD-10-CM | POA: Insufficient documentation

## 2017-11-23 DIAGNOSIS — N184 Chronic kidney disease, stage 4 (severe): Secondary | ICD-10-CM

## 2017-11-23 LAB — RENAL FUNCTION PANEL
ALBUMIN: 2.4 g/dL — AB (ref 3.5–5.0)
Anion gap: 11 (ref 5–15)
BUN: 55 mg/dL — ABNORMAL HIGH (ref 6–20)
CALCIUM: 8.2 mg/dL — AB (ref 8.9–10.3)
CO2: 20 mmol/L — AB (ref 22–32)
CREATININE: 3.67 mg/dL — AB (ref 0.44–1.00)
Chloride: 109 mmol/L (ref 101–111)
GFR, EST AFRICAN AMERICAN: 15 mL/min — AB (ref >60)
GFR, EST NON AFRICAN AMERICAN: 13 mL/min — AB (ref >60)
Glucose, Bld: 172 mg/dL — ABNORMAL HIGH (ref 65–99)
Phosphorus: 4.9 mg/dL — ABNORMAL HIGH (ref 2.5–4.6)
Potassium: 3.9 mmol/L (ref 3.5–5.1)
SODIUM: 140 mmol/L (ref 135–145)

## 2017-11-23 LAB — IRON AND TIBC
IRON: 54 ug/dL (ref 28–170)
Saturation Ratios: 22 % (ref 10.4–31.8)
TIBC: 242 ug/dL — ABNORMAL LOW (ref 250–450)
UIBC: 188 ug/dL

## 2017-11-23 LAB — FERRITIN: FERRITIN: 318 ng/mL — AB (ref 11–307)

## 2017-11-23 LAB — POCT HEMOGLOBIN-HEMACUE: HEMOGLOBIN: 8.9 g/dL — AB (ref 12.0–15.0)

## 2017-11-23 MED ORDER — DARBEPOETIN ALFA 150 MCG/0.3ML IJ SOSY
150.0000 ug | PREFILLED_SYRINGE | INTRAMUSCULAR | Status: DC
  Administered 2017-11-23: 150 ug via SUBCUTANEOUS

## 2017-11-23 MED ORDER — DARBEPOETIN ALFA 150 MCG/0.3ML IJ SOSY
PREFILLED_SYRINGE | INTRAMUSCULAR | Status: AC
  Filled 2017-11-23: qty 0.3

## 2017-11-24 LAB — PTH, INTACT AND CALCIUM
CALCIUM TOTAL (PTH): 7.9 mg/dL — AB (ref 8.7–10.2)
PTH: 240 pg/mL — ABNORMAL HIGH (ref 15–65)

## 2017-12-07 ENCOUNTER — Telehealth: Payer: Self-pay | Admitting: Internal Medicine

## 2017-12-07 NOTE — Telephone Encounter (Signed)
5 page, paperwork received through fax 12-07-17.

## 2017-12-08 ENCOUNTER — Other Ambulatory Visit: Payer: Self-pay | Admitting: Internal Medicine

## 2017-12-08 DIAGNOSIS — F419 Anxiety disorder, unspecified: Principal | ICD-10-CM

## 2017-12-08 DIAGNOSIS — F329 Major depressive disorder, single episode, unspecified: Secondary | ICD-10-CM

## 2017-12-08 MED FILL — LANTUS SOLOSTAR 100 UNITS/M: 100 | 25 days supply | Qty: 15 | Fill #5

## 2017-12-08 MED FILL — ESCITALOPRAM 20 MG TABLET: 20 | 30 days supply | Qty: 30 | Fill #0

## 2017-12-08 MED FILL — ATORVASTATIN 20 MG TABLET: 20 | 30 days supply | Qty: 15 | Fill #10

## 2017-12-09 ENCOUNTER — Encounter: Payer: Self-pay | Admitting: Internal Medicine

## 2017-12-09 ENCOUNTER — Ambulatory Visit: Payer: Medicaid Other | Attending: Internal Medicine | Admitting: Internal Medicine

## 2017-12-09 VITALS — BP 152/80 | HR 74 | Temp 97.9°F | Resp 16 | Ht 64.0 in | Wt 171.2 lb

## 2017-12-09 DIAGNOSIS — I10 Essential (primary) hypertension: Secondary | ICD-10-CM | POA: Diagnosis present

## 2017-12-09 DIAGNOSIS — F329 Major depressive disorder, single episode, unspecified: Secondary | ICD-10-CM | POA: Diagnosis not present

## 2017-12-09 DIAGNOSIS — E785 Hyperlipidemia, unspecified: Secondary | ICD-10-CM | POA: Diagnosis not present

## 2017-12-09 DIAGNOSIS — I130 Hypertensive heart and chronic kidney disease with heart failure and stage 1 through stage 4 chronic kidney disease, or unspecified chronic kidney disease: Secondary | ICD-10-CM | POA: Insufficient documentation

## 2017-12-09 DIAGNOSIS — E1122 Type 2 diabetes mellitus with diabetic chronic kidney disease: Secondary | ICD-10-CM | POA: Insufficient documentation

## 2017-12-09 DIAGNOSIS — N184 Chronic kidney disease, stage 4 (severe): Secondary | ICD-10-CM | POA: Diagnosis not present

## 2017-12-09 DIAGNOSIS — I5022 Chronic systolic (congestive) heart failure: Secondary | ICD-10-CM | POA: Insufficient documentation

## 2017-12-09 DIAGNOSIS — I429 Cardiomyopathy, unspecified: Secondary | ICD-10-CM | POA: Insufficient documentation

## 2017-12-09 DIAGNOSIS — Z794 Long term (current) use of insulin: Secondary | ICD-10-CM

## 2017-12-09 DIAGNOSIS — D509 Iron deficiency anemia, unspecified: Secondary | ICD-10-CM | POA: Insufficient documentation

## 2017-12-09 DIAGNOSIS — F419 Anxiety disorder, unspecified: Secondary | ICD-10-CM | POA: Diagnosis not present

## 2017-12-09 DIAGNOSIS — E118 Type 2 diabetes mellitus with unspecified complications: Secondary | ICD-10-CM

## 2017-12-09 DIAGNOSIS — Z7982 Long term (current) use of aspirin: Secondary | ICD-10-CM | POA: Diagnosis not present

## 2017-12-09 DIAGNOSIS — D171 Benign lipomatous neoplasm of skin and subcutaneous tissue of trunk: Secondary | ICD-10-CM | POA: Diagnosis not present

## 2017-12-09 DIAGNOSIS — E1121 Type 2 diabetes mellitus with diabetic nephropathy: Secondary | ICD-10-CM | POA: Diagnosis not present

## 2017-12-09 DIAGNOSIS — E119 Type 2 diabetes mellitus without complications: Secondary | ICD-10-CM | POA: Diagnosis present

## 2017-12-09 DIAGNOSIS — Z79899 Other long term (current) drug therapy: Secondary | ICD-10-CM | POA: Diagnosis not present

## 2017-12-09 DIAGNOSIS — E113319 Type 2 diabetes mellitus with moderate nonproliferative diabetic retinopathy with macular edema, unspecified eye: Secondary | ICD-10-CM | POA: Insufficient documentation

## 2017-12-09 LAB — POCT GLYCOSYLATED HEMOGLOBIN (HGB A1C): Hemoglobin A1C: 5.7

## 2017-12-09 LAB — GLUCOSE, POCT (MANUAL RESULT ENTRY): POC Glucose: 92 mg/dl (ref 70–99)

## 2017-12-09 NOTE — Patient Instructions (Signed)
Discuss with your nephrologist about whether you are transplant candidate. Please stop at the pharmacy today to pick up the new blood pressure medicine that Dr. Joelyn Oms called in for you. Try to check your blood pressure a few times a week.  The goal is to be 130/80 or lower

## 2017-12-09 NOTE — Progress Notes (Signed)
Patient ID: Patricia [name], female    DOB: May 12, 1956  MRN: [id number]  CC: Diabetes and Hypertension   Subjective: Patricia [name] is a 62 y.o. female who presents for chronic ds management. Her concerns today include:  Patient with history of HTN, HL, DM type 2 with retinopathy,neuropathyand nephropathy (CKD stage 4),s-CHF with EF improved from 25-30% to 40% as of 04/2017 possible Takotsubo variant, mixed anemia ACDand IDA, HL and depression  1. Lipoma R flank:  Feels  Like it is getting larger and itches  2.  CKD stage 4/ACD/HTN:  Saw Dr. Joelyn Oms last wk.  Told her kidney function got a little better from stage 4 to 3.5.  However, she went to classes about HD.  She does not want to end up on HD though.  Her daughter would like to be eval to see if she can be a donor for pt -still getting Aranesp shot; reports dose increase because last Hb dec from 9 to 8. -reports another BP med was added by Dr. Joelyn Oms but she does not recall the name and has not fill it as yet  3.  s-CHF:  No CP/SOB/LE edema -limits salt in foods -compliant with meds  4.  HL:  Need RF on Lipitor  5.  DM:  Compliant with meds.  BS BID.  Range in a.m 90-120.  Evenings around 150.  Occasional hypoglycemia in middle of nights. Doing well with eating habits.   Patient Active Problem List   Diagnosis Date Noted  . Systolic CHF ([place]) 31/54/0086  . Diabetes mellitus type 2 with complications ([place]) 76/19/5093  . Transaminasemia 11/29/2016  . Cardiomyopathy ([place]) 11/12/2016  . Lipoma 10/21/2016  . CKD (chronic kidney disease) stage 4, GFR 15-29 ml/min (HCC) 08/10/2016  . Anxiety and depression 12/12/2015  . Diabetic nephropathy associated with type 2 diabetes mellitus ([place]) 11/15/2014  . Moderate nonproliferative diabetic retinopathy(362.05) 04/17/2014  . 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 Essential hypertension 03/28/2014  . Dental caries 03/28/2014  . Dyslipidemia 04/18/2013     Current Outpatient  Medications on File Prior to Visit  Medication Sig Dispense Refill  . aspirin EC 81 MG tablet Take 1 tablet (81 mg total) by mouth daily. 90 tablet 3  . atorvastatin (LIPITOR) 20 MG tablet Take 0.5 tablets (10 mg total) by mouth daily. 90 tablet 3  . bismuth subsalicylate (PEPTO BISMOL) 262 MG/15ML suspension Take 30 mLs by mouth every 6 (six) hours as needed (as needed).    . Blood Pressure Monitoring (BLOOD PRESSURE KIT) DEVI 1 application by Does not apply route daily. Use as directed daily 1 Device 0  . carvedilol (COREG) 25 MG tablet Take 1 tablet (25 mg total) by mouth 2 (two) times daily with a meal. 180 tablet 3  . escitalopram (LEXAPRO) 20 MG tablet TAKE 1 TABLET BY MOUTH AT BEDTIME. 30 tablet 2  . furosemide (LASIX) 40 MG tablet TAKE 1 TABLET BY MOUTH 2 TIMES DAILY. 60 tablet 1  . gabapentin (NEURONTIN) 100 MG capsule Take 1 capsule (100 mg total) by mouth at bedtime. 90 capsule 3  . glipiZIDE (GLUCOTROL) 10 MG tablet TAKE 1 TABLET BY MOUTH 2 TIMES DAILY BEFORE A MEAL. 180 tablet 3  . glucose blood (TRUE METRIX BLOOD GLUCOSE TEST) test strip Use as instructed 100 each 12  . hydrALAZINE (APRESOLINE) 100 MG tablet TAKE 1 TABLET BY MOUTH TWICE DAILY 180 tablet 0  . insulin aspart (NOVOLOG) 100 UNIT/ML injection 3 units before meals. 60 mL  3  . Insulin Glargine (LANTUS SOLOSTAR) 100 UNIT/ML Solostar Pen Inject 27 Units into the skin 2 (two) times daily. 90 mL 0  . Insulin Pen Needle 29G X 12.7MM MISC 10 Units by Does not apply route 1 day or 1 dose. 100 each 11  . Insulin Syringe-Needle U-100 30G X 5/16" 1 ML MISC Test 3 times daily 100 each 11  . isosorbide mononitrate (IMDUR) 60 MG 24 hr tablet Take 1 tablet (60 mg total) by mouth daily. 90 tablet 3  . lisinopril (PRINIVIL,ZESTRIL) 2.5 MG tablet Take 2.5 mg by mouth daily.    . TRUEPLUS LANCETS 28G MISC Use as directed 100 each 12   Current Facility-Administered Medications on File Prior to Visit  Medication Dose Route Frequency  Provider Last Rate Last Dose  . 0.9 %  sodium chloride infusion  500 mL Intravenous Continuous Danis, Estill Cotta III, MD      . INSTA-GLUCOSE 77.4 % GEL 1 Tube  1 Tube Oral Once Ladell Pier, MD        Allergies  Allergen Reactions  . Codone [Hydrocodone] Itching  . Norvasc [Amlodipine Besylate] Swelling    Lower extremity?  . Sulfa Antibiotics Itching and Rash    Social History   Socioeconomic History  . Marital status: Divorced    Spouse name: Not on file  . Number of children: 2  . Years of education: Not on file  . Highest education level: Not on file  Social Needs  . Financial resource strain: Not on file  . Food insecurity - worry: Not on file  . Food insecurity - inability: Not on file  . Transportation needs - medical: Not on file  . Transportation needs - non-medical: Not on file  Occupational History  . Occupation: disabled  Tobacco Use  . Smoking status: Never Smoker  . Smokeless tobacco: Never Used  Substance and Sexual Activity  . Alcohol use: No  . Drug use: No  . Sexual activity: Not on file  Other Topics Concern  . Not on file  Social History Narrative  . Not on file    Family History  Problem Relation Age of Onset  . Diabetes Brother   . Stomach cancer Brother   . Kidney disease Brother   . Heart Problems Maternal Grandmother        pacemaker  . Heart disease Maternal Grandfather   . Rectal cancer Neg Hx   . Esophageal cancer Neg Hx   . Liver cancer Neg Hx   . Colon cancer Neg Hx     Past Surgical History:  Procedure Laterality Date  . ABDOMINAL HYSTERECTOMY    . BREAST SURGERY     reduction  . [place]  . REDUCTION MAMMAPLASTY Bilateral     ROS: Review of Systems  PHYSICAL EXAM: BP (!) 152/80   Pulse 74   Temp 97.9 F (36.6 C) (Oral)   Resp 16   Ht _0  (1.626 m)   Wt 171 lb 3.2 oz (77.7 kg)   SpO2 97%   BMI 29.39 kg/m   Wt Readings from Last 3 Encounters:  12/09/17 171 lb 3.2 oz (77.7 kg)    11/15/17 180 lb 3.2 oz (81.7 kg)  11/04/17 176 lb 9.6 oz (80.1 kg)   Repeat BP 152/80 Physical Exam General appearance - alert, well appearing, and in no distress Mental status - alert, oriented to person, place, and time, normal mood, behavior, speech, dress, motor activity, and  thought processes Neck - supple, no significant adenopathy Chest - clear to auscultation, no wheezes, rales or rhonchi, symmetric air entry Heart - normal rate, regular rhythm, normal S1, S2, no murmurs, rubs, clicks or gallops Extremities - peripheral pulses normal, no pedal edema, no clubbing or cyanosis  Lab Results  Component Value Date   WBC 5.8 11/04/2017   HGB 8.9 (L) 11/23/2017   HCT 32.0 (L) 11/04/2017   MCV 89.4 11/04/2017   PLT 267 11/04/2017     Chemistry      Component Value Date/Time   NA 140 11/23/2017 1000   K 3.9 11/23/2017 1000   CL 109 11/23/2017 1000   CO2 20 (L) 11/23/2017 1000   BUN 55 (H) 11/23/2017 1000   CREATININE 3.67 (H) 11/23/2017 1000   CREATININE 2.22 (H) 12/25/2016 1000      Component Value Date/Time   CALCIUM 8.2 (L) 11/23/2017 1000   CALCIUM 7.9 (L) 11/23/2017 0959   ALKPHOS 112 11/29/2016 0325   AST 62 (H) 11/29/2016 0325   ALT 62 (H) 11/29/2016 0325   BILITOT 0.5 11/29/2016 0325      ASSESSMENT AND PLAN: 1. Type 2 diabetes mellitus with complication, with long-term current use of insulin (HCC) At goal.  Continue glipizide, Lantus and NovoLog. Continue healthy eating habits - POCT glucose (manual entry) - POCT glycosylated hemoglobin (Hb A1C)  2. Essential hypertension Repeat blood pressure today is better but not at goal. She will pick up the new blood pressure medicine that her nephrologist has added.  She does not recall the name of the medicine I have asked her to write down the name and bring it in on next visit -Went over blood pressure goals for CKD which is 130/80 or lower  3. CKD stage 4 due to type 2 diabetes mellitus [facility]) -Encourage  patient to speak with Dr. Joelyn Oms if she is wanting and willing to be placed on the transplant list.  He would be better able to evaluate whether or not she is a good candidate for transplant  4. Dyslipidemia Continue Lipitor  5. Iron deficiency anemia, unspecified iron deficiency anemia type Receiving Aranesp  6. Chronic systolic congestive heart failure (HCC) Clinically stable.  Continue to limit salt in the foods.  Continue carvedilol, lisinopril, and furosemide  Patient was given the opportunity to ask questions.  Patient verbalized understanding of the plan and was able to repeat key elements of the plan.   Orders Placed This Encounter  Procedures  . POCT glucose (manual entry)  . POCT glycosylated hemoglobin (Hb A1C)     Requested Prescriptions    No prescriptions requested or ordered in this encounter    No Follow-up on file.  Karle Plumber, MD, FACP

## 2017-12-17 MED FILL — hydrALAZINE HCL 50 MG TABS: 50 | 30 days supply | Qty: 90 | Fill #3

## 2017-12-17 MED FILL — ISOSORBIDE MN ER 60 MG TAB: 60 | 30 days supply | Qty: 30 | Fill #5

## 2017-12-17 MED FILL — LISINOPRIL 5 MG TAB: 5 | 30 days supply | Qty: 15 | Fill #10

## 2017-12-17 MED FILL — glipiZIDE 10 MG TABS: 10 | 30 days supply | Qty: 60 | Fill #11

## 2017-12-20 ENCOUNTER — Other Ambulatory Visit (HOSPITAL_COMMUNITY): Payer: Self-pay | Admitting: *Deleted

## 2017-12-21 ENCOUNTER — Encounter (HOSPITAL_COMMUNITY)
Admission: RE | Admit: 2017-12-21 | Discharge: 2017-12-21 | Disposition: A | Discharge status: Home / Self Care | Payer: Medicaid Other | Source: Ambulatory Visit | Attending: Nephrology | Admitting: Nephrology

## 2017-12-21 VITALS — BP 166/77 | HR 72 | Temp 98.6°F | Resp 18 | Ht 65.0 in | Wt 180.0 lb

## 2017-12-21 DIAGNOSIS — D631 Anemia in chronic kidney disease: Secondary | ICD-10-CM | POA: Diagnosis present

## 2017-12-21 DIAGNOSIS — N184 Chronic kidney disease, stage 4 (severe): Secondary | ICD-10-CM

## 2017-12-21 LAB — RENAL FUNCTION PANEL
ALBUMIN: 2.8 g/dL — AB (ref 3.5–5.0)
ANION GAP: 13 (ref 5–15)
BUN: 89 mg/dL — ABNORMAL HIGH (ref 6–20)
CALCIUM: 8.3 mg/dL — AB (ref 8.9–10.3)
CO2: 20 mmol/L — ABNORMAL LOW (ref 22–32)
Chloride: 105 mmol/L (ref 101–111)
Creatinine, Ser: 4.81 mg/dL — ABNORMAL HIGH (ref 0.44–1.00)
GFR calc Af Amer: 10 mL/min — ABNORMAL LOW (ref >60)
GFR calc non Af Amer: 9 mL/min — ABNORMAL LOW (ref >60)
GLUCOSE: 162 mg/dL — AB (ref 65–99)
PHOSPHORUS: 6.2 mg/dL — AB (ref 2.5–4.6)
Potassium: 4.1 mmol/L (ref 3.5–5.1)
SODIUM: 138 mmol/L (ref 135–145)

## 2017-12-21 LAB — IRON AND TIBC
Iron: 69 ug/dL (ref 28–170)
Saturation Ratios: 24 % (ref 10.4–31.8)
TIBC: 287 ug/dL (ref 250–450)
UIBC: 218 ug/dL

## 2017-12-21 LAB — POCT HEMOGLOBIN-HEMACUE: Hemoglobin: 10.3 g/dL — ABNORMAL LOW (ref 12.0–15.0)

## 2017-12-21 LAB — FERRITIN: Ferritin: 231 ng/mL (ref 11–307)

## 2017-12-21 MED ORDER — DARBEPOETIN ALFA 200 MCG/0.4ML IJ SOSY
PREFILLED_SYRINGE | INTRAMUSCULAR | Status: AC
  Filled 2017-12-21: qty 0.4

## 2017-12-21 MED ORDER — DARBEPOETIN ALFA 200 MCG/0.4ML IJ SOSY
200.0000 ug | PREFILLED_SYRINGE | INTRAMUSCULAR | Status: DC
  Administered 2017-12-21: 200 ug via SUBCUTANEOUS

## 2017-12-21 MED ORDER — SODIUM CHLORIDE 0.9 % IV SOLN
510.0000 mg | Freq: Once | INTRAVENOUS | Status: AC
  Administered 2017-12-21: 11:00:00 510 mg via INTRAVENOUS
  Filled 2017-12-21: qty 17

## 2018-01-10 MED FILL — GABAPENTIN 100 MG CAPSULE: 100 | 30 days supply | Qty: 30 | Fill #1

## 2018-01-10 MED FILL — ESCITALOPRAM 20 MG TABLET: 20 | 30 days supply | Qty: 30 | Fill #1

## 2018-01-12 ENCOUNTER — Other Ambulatory Visit: Payer: Self-pay | Admitting: Pharmacist

## 2018-01-12 DIAGNOSIS — E785 Hyperlipidemia, unspecified: Secondary | ICD-10-CM

## 2018-01-12 MED ORDER — ATORVASTATIN CALCIUM 20 MG PO TABS: 10.0000 mg | ORAL_TABLET | Freq: Every day | ORAL | 0 refills | Status: DC

## 2018-01-12 MED FILL — ATORVASTATIN 20 MG TABLET: 20 | 30 days supply | Qty: 15 | Fill #0

## 2018-01-13 MED FILL — ISOSORBIDE MN ER 60 MG TAB: 60 | 30 days supply | Qty: 30 | Fill #6

## 2018-01-14 ENCOUNTER — Other Ambulatory Visit: Payer: Self-pay | Admitting: Pharmacist

## 2018-01-14 DIAGNOSIS — Z794 Long term (current) use of insulin: Principal | ICD-10-CM

## 2018-01-14 DIAGNOSIS — E1165 Type 2 diabetes mellitus with hyperglycemia: Secondary | ICD-10-CM

## 2018-01-14 MED ORDER — GLIPIZIDE 10 MG PO TABS: ORAL_TABLET | ORAL | 0 refills | Status: DC

## 2018-01-14 MED FILL — glipiZIDE 10 MG TABS: 10 | 60 days supply | Qty: 60 | Fill #0

## 2018-01-18 ENCOUNTER — Encounter (HOSPITAL_COMMUNITY)
Admission: RE | Admit: 2018-01-18 | Discharge: 2018-01-18 | Disposition: A | Discharge status: Home / Self Care | Payer: Medicaid Other | Source: Ambulatory Visit | Attending: Nephrology | Admitting: Nephrology

## 2018-01-18 VITALS — BP 150/71 | HR 89 | Temp 98.1°F | Resp 18

## 2018-01-18 DIAGNOSIS — D631 Anemia in chronic kidney disease: Secondary | ICD-10-CM | POA: Diagnosis not present

## 2018-01-18 DIAGNOSIS — N184 Chronic kidney disease, stage 4 (severe): Secondary | ICD-10-CM

## 2018-01-18 LAB — RENAL FUNCTION PANEL
ALBUMIN: 2.7 g/dL — AB (ref 3.5–5.0)
ANION GAP: 14 (ref 5–15)
BUN: 83 mg/dL — ABNORMAL HIGH (ref 6–20)
CHLORIDE: 105 mmol/L (ref 101–111)
CO2: 19 mmol/L — ABNORMAL LOW (ref 22–32)
Calcium: 8.1 mg/dL — ABNORMAL LOW (ref 8.9–10.3)
Creatinine, Ser: 4.84 mg/dL — ABNORMAL HIGH (ref 0.44–1.00)
GFR calc Af Amer: 10 mL/min — ABNORMAL LOW (ref >60)
GFR calc non Af Amer: 9 mL/min — ABNORMAL LOW (ref >60)
GLUCOSE: 174 mg/dL — AB (ref 65–99)
Phosphorus: 7.1 mg/dL — ABNORMAL HIGH (ref 2.5–4.6)
Potassium: 4.5 mmol/L (ref 3.5–5.1)
Sodium: 138 mmol/L (ref 135–145)

## 2018-01-18 LAB — IRON AND TIBC
Iron: 87 ug/dL (ref 28–170)
Saturation Ratios: 33 % — ABNORMAL HIGH (ref 10.4–31.8)
TIBC: 263 ug/dL (ref 250–450)
UIBC: 176 ug/dL

## 2018-01-18 LAB — POCT HEMOGLOBIN-HEMACUE: Hemoglobin: 11 g/dL — ABNORMAL LOW (ref 12.0–15.0)

## 2018-01-18 LAB — FERRITIN: Ferritin: 368 ng/mL — ABNORMAL HIGH (ref 11–307)

## 2018-01-18 MED ORDER — DARBEPOETIN ALFA 200 MCG/0.4ML IJ SOSY
PREFILLED_SYRINGE | INTRAMUSCULAR | Status: AC
  Filled 2018-01-18: qty 0.4

## 2018-01-18 MED ORDER — DARBEPOETIN ALFA 200 MCG/0.4ML IJ SOSY
200.0000 ug | PREFILLED_SYRINGE | INTRAMUSCULAR | Status: DC
  Administered 2018-01-18: 10:00:00 200 ug via SUBCUTANEOUS

## 2018-01-25 ENCOUNTER — Other Ambulatory Visit: Payer: Self-pay | Admitting: Internal Medicine

## 2018-01-25 MED ORDER — HYDRALAZINE HCL 100 MG PO TABS: 100.0000 mg | ORAL_TABLET | Freq: Two times a day (BID) | ORAL | 0 refills | Status: DC

## 2018-01-25 MED FILL — hydrALAZINE HCL 100 MG TABS: 100 | 30 days supply | Qty: 60 | Fill #0

## 2018-01-25 MED FILL — LISINOPRIL 5 MG TAB: 5 | 30 days supply | Qty: 15 | Fill #11

## 2018-01-27 ENCOUNTER — Other Ambulatory Visit: Payer: Self-pay | Admitting: Pharmacist

## 2018-01-27 MED ORDER — INSULIN GLARGINE 100 UNIT/ML SOLOSTAR PEN: 27.0000 [IU] | PEN_INJECTOR | Freq: Two times a day (BID) | SUBCUTANEOUS | 0 refills | Status: DC

## 2018-01-27 MED FILL — LANTUS SOLOSTAR 100 UNITS/M: 100 | 27 days supply | Qty: 15 | Fill #0

## 2018-02-01 ENCOUNTER — Telehealth: Payer: Self-pay

## 2018-02-01 ENCOUNTER — Ambulatory Visit: Payer: Self-pay | Admitting: General Surgery

## 2018-02-01 ENCOUNTER — Telehealth: Payer: Self-pay | Admitting: Internal Medicine

## 2018-02-01 NOTE — Telephone Encounter (Signed)
3 page, paperwork received through fax 02-01-18.

## 2018-02-01 NOTE — Telephone Encounter (Signed)
   [facility] Pre-operative Risk Assessment    Request for surgical clearance:  1. What type of surgery is being performed? Lipoma from back  2. When is this surgery scheduled? TBD  3. What type of clearance is required (medical clearance vs. Pharmacy clearance to hold med vs. Both)? Medical  4. Are there any medications that need to be held prior to surgery and how long?Aspirin  5. Practice name and name of physician performing surgery? [facility]   Dr.Benjamin Hoxworth  6. What is your office phone number [phone]    7.   What is your office fax number [phone]  8.   Anesthesia type (None, local, MAC, general) ?       General   Patricia [name] 02/01/2018, 5:30 PM  _________________________________________________________________   (provider comments below)

## 2018-02-02 NOTE — Telephone Encounter (Signed)
   Primary Cardiologist: Dr Sallyanne Kuster  Chart reviewed as part of pre-operative protocol coverage. Given past medical history and time since last visit, phone call with patient today, and based on ACC/AHA guidelines, DENIM START would be at acceptable risk for the planned procedure without further cardiovascular testing.   I will route this recommendation to the requesting party via Epic fax function and remove from pre-op pool.  Please call with questions.  Kerin Ransom, PA-C 02/02/2018, 2:53 PM

## 2018-02-14 ENCOUNTER — Other Ambulatory Visit (HOSPITAL_COMMUNITY): Payer: Self-pay | Admitting: *Deleted

## 2018-02-15 ENCOUNTER — Ambulatory Visit (HOSPITAL_COMMUNITY)
Admission: RE | Admit: 2018-02-15 | Discharge: 2018-02-15 | Disposition: A | Discharge status: Home / Self Care | Payer: Medicaid Other | Source: Ambulatory Visit | Attending: Nephrology | Admitting: Nephrology

## 2018-02-15 VITALS — BP 171/81 | HR 78 | Temp 98.0°F | Resp 18

## 2018-02-15 DIAGNOSIS — D631 Anemia in chronic kidney disease: Secondary | ICD-10-CM | POA: Diagnosis not present

## 2018-02-15 DIAGNOSIS — N184 Chronic kidney disease, stage 4 (severe): Secondary | ICD-10-CM | POA: Insufficient documentation

## 2018-02-15 LAB — IRON AND TIBC
Iron: 73 ug/dL (ref 28–170)
SATURATION RATIOS: 28 % (ref 10.4–31.8)
TIBC: 258 ug/dL (ref 250–450)
UIBC: 185 ug/dL

## 2018-02-15 LAB — FERRITIN: Ferritin: 359 ng/mL — ABNORMAL HIGH (ref 11–307)

## 2018-02-15 LAB — POCT HEMOGLOBIN-HEMACUE: HEMOGLOBIN: 10.9 g/dL — AB (ref 12.0–15.0)

## 2018-02-15 MED ORDER — DARBEPOETIN ALFA 200 MCG/0.4ML IJ SOSY
200.0000 ug | PREFILLED_SYRINGE | INTRAMUSCULAR | Status: DC
  Administered 2018-02-15: 10:00:00 200 ug via SUBCUTANEOUS

## 2018-02-15 MED ORDER — DARBEPOETIN ALFA 200 MCG/0.4ML IJ SOSY
PREFILLED_SYRINGE | INTRAMUSCULAR | Status: AC
  Filled 2018-02-15: qty 0.4

## 2018-02-15 MED FILL — ATORVASTATIN 20 MG TABLET: 20 | 30 days supply | Qty: 15 | Fill #1

## 2018-02-15 MED FILL — ISOSORBIDE MN ER 60 MG TAB: 60 | 30 days supply | Qty: 30 | Fill #7

## 2018-02-15 MED FILL — ESCITALOPRAM 20 MG TABLET: 20 | 30 days supply | Qty: 30 | Fill #2

## 2018-02-16 LAB — PTH, INTACT AND CALCIUM
CALCIUM TOTAL (PTH): 8.4 mg/dL — AB (ref 8.7–10.2)
PTH: 415 pg/mL — AB (ref 15–65)

## 2018-02-21 MED FILL — LANTUS SOLOSTAR 100 UNITS/M: 100 | 27 days supply | Qty: 15 | Fill #1

## 2018-02-21 MED FILL — glipiZIDE 10 MG TABS: 10 | 30 days supply | Qty: 60 | Fill #1

## 2018-03-02 NOTE — Progress Notes (Signed)
Patient with hx multiple comorbidities, CKD stage 4 needing dialysis but wanting transplant, DM, cardiomyopathy with EF 25-30%on ECHO 11-29-16. She will be better served being done at main OR per Surgery center guidelines. Caryl Pina at [place] office notified.

## 2018-03-15 ENCOUNTER — Ambulatory Visit: Payer: Medicaid Other | Admitting: Internal Medicine

## 2018-03-15 ENCOUNTER — Ambulatory Visit (HOSPITAL_COMMUNITY)
Admission: RE | Admit: 2018-03-15 | Discharge: 2018-03-15 | Disposition: A | Discharge status: Home / Self Care | Payer: Medicaid Other | Source: Ambulatory Visit | Attending: Nephrology | Admitting: Nephrology

## 2018-03-15 VITALS — BP 147/70 | HR 78 | Temp 97.8°F | Resp 18

## 2018-03-15 DIAGNOSIS — N184 Chronic kidney disease, stage 4 (severe): Secondary | ICD-10-CM | POA: Diagnosis present

## 2018-03-15 DIAGNOSIS — D631 Anemia in chronic kidney disease: Secondary | ICD-10-CM | POA: Insufficient documentation

## 2018-03-15 LAB — RENAL FUNCTION PANEL
ALBUMIN: 2.8 g/dL — AB (ref 3.5–5.0)
ANION GAP: 12 (ref 5–15)
BUN: 89 mg/dL — ABNORMAL HIGH (ref 6–20)
CALCIUM: 8.3 mg/dL — AB (ref 8.9–10.3)
CO2: 21 mmol/L — ABNORMAL LOW (ref 22–32)
Chloride: 106 mmol/L (ref 101–111)
Creatinine, Ser: 5.87 mg/dL — ABNORMAL HIGH (ref 0.44–1.00)
GFR calc non Af Amer: 7 mL/min — ABNORMAL LOW (ref >60)
GFR, EST AFRICAN AMERICAN: 8 mL/min — AB (ref >60)
GLUCOSE: 147 mg/dL — AB (ref 65–99)
PHOSPHORUS: 7.6 mg/dL — AB (ref 2.5–4.6)
POTASSIUM: 4.4 mmol/L (ref 3.5–5.1)
SODIUM: 139 mmol/L (ref 135–145)

## 2018-03-15 LAB — IRON AND TIBC
Iron: 85 ug/dL (ref 28–170)
Saturation Ratios: 33 % — ABNORMAL HIGH (ref 10.4–31.8)
TIBC: 256 ug/dL (ref 250–450)
UIBC: 171 ug/dL

## 2018-03-15 LAB — FERRITIN: Ferritin: 348 ng/mL — ABNORMAL HIGH (ref 11–307)

## 2018-03-15 LAB — POCT HEMOGLOBIN-HEMACUE: HEMOGLOBIN: 10.8 g/dL — AB (ref 12.0–15.0)

## 2018-03-15 MED ORDER — DARBEPOETIN ALFA 200 MCG/0.4ML IJ SOSY
200.0000 ug | PREFILLED_SYRINGE | INTRAMUSCULAR | Status: DC
  Administered 2018-03-15: 200 ug via SUBCUTANEOUS

## 2018-03-15 MED ORDER — DARBEPOETIN ALFA 200 MCG/0.4ML IJ SOSY
PREFILLED_SYRINGE | INTRAMUSCULAR | Status: AC
  Filled 2018-03-15: qty 0.4

## 2018-03-15 NOTE — Patient Instructions (Addendum)
Patricia [name]  03/15/2018   Your procedure is scheduled on: Tuesday 03/22/2018   Report to [facility] Main  Entrance              Report to admitting at   0930 AM    Call this number if you have problems the morning of surgery [phone]    Remember: Do not eat food or drink liquids :After Midnight.  How to Manage Your Diabetes Before and After Surgery  Why is it important to control my blood sugar before and after surgery? . Improving blood sugar levels before and after surgery helps healing and can limit problems. . A way of improving blood sugar control is eating a healthy diet by: o  Eating less sugar and carbohydrates o  Increasing activity/exercise o  Talking with your doctor about reaching your blood sugar goals . High blood sugars (greater than 180 mg/dL) can raise your risk of infections and slow your recovery, so you will need to focus on controlling your diabetes during the weeks before surgery. . Make sure that the doctor who takes care of your diabetes knows about your planned surgery including the date and location.  How do I manage my blood sugar before surgery? . Check your blood sugar at least 4 times a day, starting 2 days before surgery, to make sure that the level is not too high or low. o Check your blood sugar the morning of your surgery when you wake up and every 2 hours until you get to the Short Stay unit. . If your blood sugar is less than 70 mg/dL, you will need to treat for low blood sugar: o Do not take insulin. o Treat a low blood sugar (less than 70 mg/dL) with  cup of clear juice (cranberry or apple), 4 glucose tablets, OR glucose gel. o Recheck blood sugar in 15 minutes after treatment (to make sure it is greater than 70 mg/dL). If your blood sugar is not greater than 70 mg/dL on recheck, call [phone] for further instructions. . Report your blood sugar to the short stay nurse when you get to Short Stay.  . If you are  admitted to the hospital after surgery: o Your blood sugar will be checked by the staff and you will probably be given insulin after surgery (instead of oral diabetes medicines) to make sure you have good blood sugar levels. o The goal for blood sugar control after surgery is 80-180 mg/dL.   WHAT DO I DO ABOUT MY DIABETES MEDICATION?            The day before surgery, take Glipizide morning dose only!        . Do not take oral diabetes medicines (pills) the morning of surgery.  . THE NIGHT BEFORE SURGERY, take 12     units of    Lantus   insulin.  .            The night before surgery, take No Bedtime Dose of Novolog Insulin!  . THE MORNING OF SURGERY, take   12  units of   Lantus       insulin.  . The day of surgery, do not take other diabetes injectables, including Byetta (exenatide), Bydureon (exenatide ER), Victoza (liraglutide), or Trulicity (dulaglutide). .  Morning of surgery: . If your CBG is greater than 220 mg/dL, you may take  of your sliding  scale (Novolog Insulin) . (correction) dose of insulin.       Take these medicines the morning of surgery with A SIP OF WATER: Carvedilol (Coreg), Hydralazine (Apresoline), Isosorbide Mononitrate (Imdur)                                 You may not have any metal on your body including hair pins and              piercings  Do not wear jewelry, make-up, lotions, powders or perfumes, deodorant             Do not wear nail polish.  Do not shave  48 hours prior to surgery.              Do not bring valuables to the hospital. [place].  Contacts, dentures or bridgework may not be worn into surgery.      Patients discharged the day of surgery will not be allowed to drive home.  Name and phone number of your driver:                Please read over the following fact sheets you were given: _____________________________________________________________________           [facility]  - Preparing for Surgery Before surgery, you can play an important role.  Because skin is not sterile, your skin needs to be as free of germs as possible.  You can reduce the number of germs on your skin by washing with CHG (chlorahexidine gluconate) soap before surgery.  CHG is an antiseptic cleaner which kills germs and bonds with the skin to continue killing germs even after washing. Please DO NOT use if you have an allergy to CHG or antibacterial soaps.  If your skin becomes reddened/irritated stop using the CHG and inform your nurse when you arrive at Short Stay. Do not shave (including legs and underarms) for at least 48 hours prior to the first CHG shower.  You may shave your face/neck. Please follow these instructions carefully:  1.  Shower with CHG Soap the night before surgery and the  morning of Surgery.  2.  If you choose to wash your hair, wash your hair first as usual with your  normal  shampoo.  3.  After you shampoo, rinse your hair and body thoroughly to remove the  shampoo.                           4.  Use CHG as you would any other liquid soap.  You can apply chg directly  to the skin and wash                       Gently with a scrungie or clean washcloth.  5.  Apply the CHG Soap to your body ONLY FROM THE NECK DOWN.   Do not use on face/ open                           Wound or open sores. Avoid contact with eyes, ears mouth and genitals (private parts).                       Wash  face,  Genitals (private parts) with your normal soap.             6.  Wash thoroughly, paying special attention to the area where your surgery  will be performed.  7.  Thoroughly rinse your body with warm water from the neck down.  8.  DO NOT shower/wash with your normal soap after using and rinsing off  the CHG Soap.                9.  Pat yourself dry with a clean towel.            10.  Wear clean pajamas.            11.  Place clean sheets on your bed the night of your first shower and do not  sleep  with pets. Day of Surgery : Do not apply any lotions/deodorants the morning of surgery.  Please wear clean clothes to the hospital/surgery center.  FAILURE TO FOLLOW THESE INSTRUCTIONS MAY RESULT IN THE CANCELLATION OF YOUR SURGERY PATIENT SIGNATURE_________________________________  NURSE SIGNATURE__________________________________  ________________________________________________________________________

## 2018-03-16 ENCOUNTER — Encounter (HOSPITAL_COMMUNITY): Payer: Self-pay

## 2018-03-16 ENCOUNTER — Encounter (HOSPITAL_COMMUNITY)
Admission: RE | Admit: 2018-03-16 | Discharge: 2018-03-16 | Disposition: A | Discharge status: Home / Self Care | Payer: Medicaid Other | Source: Ambulatory Visit | Attending: General Surgery | Admitting: General Surgery

## 2018-03-16 ENCOUNTER — Other Ambulatory Visit: Payer: Self-pay

## 2018-03-16 DIAGNOSIS — Z79899 Other long term (current) drug therapy: Secondary | ICD-10-CM | POA: Insufficient documentation

## 2018-03-16 DIAGNOSIS — Z01818 Encounter for other preprocedural examination: Secondary | ICD-10-CM | POA: Insufficient documentation

## 2018-03-16 DIAGNOSIS — Z794 Long term (current) use of insulin: Secondary | ICD-10-CM | POA: Insufficient documentation

## 2018-03-16 DIAGNOSIS — D171 Benign lipomatous neoplasm of skin and subcutaneous tissue of trunk: Secondary | ICD-10-CM | POA: Diagnosis not present

## 2018-03-16 DIAGNOSIS — Z7982 Long term (current) use of aspirin: Secondary | ICD-10-CM | POA: Insufficient documentation

## 2018-03-16 HISTORY — DX: Dyspnea, unspecified: R06.00

## 2018-03-16 LAB — CBC
HEMATOCRIT: 33.3 % — AB (ref 36.0–46.0)
HEMOGLOBIN: 11 g/dL — AB (ref 12.0–15.0)
MCH: 30.2 pg (ref 26.0–34.0)
MCHC: 33 g/dL (ref 30.0–36.0)
MCV: 91.5 fL (ref 78.0–100.0)
PLATELETS: 171 10*3/uL (ref 150–400)
RBC: 3.64 MIL/uL — AB (ref 3.87–5.11)
RDW: 15.6 % — ABNORMAL HIGH (ref 11.5–15.5)
WBC: 6.9 10*3/uL (ref 4.0–10.5)

## 2018-03-16 LAB — GLUCOSE, CAPILLARY: Glucose-Capillary: 112 mg/dL — ABNORMAL HIGH (ref 65–99)

## 2018-03-16 NOTE — Progress Notes (Signed)
Pt. Cleared for surgery cardiac 02-02-18 . ekg 11-15-17 Dr. Marcie Bal aware of EKG  bmp done 03-15-18 epic cxr 11-04-17 epic Stress 04-30-17 epic

## 2018-03-22 ENCOUNTER — Ambulatory Visit (HOSPITAL_COMMUNITY)
Admission: RE | Admit: 2018-03-22 | Discharge: 2018-03-22 | Disposition: A | Discharge status: Home / Self Care | Payer: Medicaid Other | Source: Ambulatory Visit | Attending: General Surgery | Admitting: General Surgery

## 2018-03-22 ENCOUNTER — Ambulatory Visit (HOSPITAL_COMMUNITY): Payer: Medicaid Other | Admitting: Certified Registered Nurse Anesthetist

## 2018-03-22 ENCOUNTER — Encounter (HOSPITAL_COMMUNITY)
Admission: RE | Disposition: A | Discharge status: Home / Self Care | Payer: Self-pay | Source: Ambulatory Visit | Attending: General Surgery

## 2018-03-22 ENCOUNTER — Encounter (HOSPITAL_COMMUNITY): Payer: Self-pay | Admitting: Certified Registered Nurse Anesthetist

## 2018-03-22 DIAGNOSIS — Z7982 Long term (current) use of aspirin: Secondary | ICD-10-CM | POA: Diagnosis not present

## 2018-03-22 DIAGNOSIS — D171 Benign lipomatous neoplasm of skin and subcutaneous tissue of trunk: Secondary | ICD-10-CM | POA: Diagnosis not present

## 2018-03-22 DIAGNOSIS — F419 Anxiety disorder, unspecified: Secondary | ICD-10-CM | POA: Diagnosis not present

## 2018-03-22 DIAGNOSIS — I129 Hypertensive chronic kidney disease with stage 1 through stage 4 chronic kidney disease, or unspecified chronic kidney disease: Secondary | ICD-10-CM | POA: Insufficient documentation

## 2018-03-22 DIAGNOSIS — Z882 Allergy status to sulfonamides status: Secondary | ICD-10-CM | POA: Diagnosis not present

## 2018-03-22 DIAGNOSIS — N189 Chronic kidney disease, unspecified: Secondary | ICD-10-CM | POA: Diagnosis not present

## 2018-03-22 DIAGNOSIS — Z794 Long term (current) use of insulin: Secondary | ICD-10-CM | POA: Insufficient documentation

## 2018-03-22 DIAGNOSIS — E1122 Type 2 diabetes mellitus with diabetic chronic kidney disease: Secondary | ICD-10-CM | POA: Insufficient documentation

## 2018-03-22 DIAGNOSIS — Z79899 Other long term (current) drug therapy: Secondary | ICD-10-CM | POA: Diagnosis not present

## 2018-03-22 HISTORY — PX: LIPOMA EXCISION: SHX5283

## 2018-03-22 LAB — GLUCOSE, CAPILLARY: Glucose-Capillary: 170 mg/dL — ABNORMAL HIGH (ref 65–99)

## 2018-03-22 SURGERY — EXCISION LIPOMA
Anesthesia: General

## 2018-03-22 MED ORDER — ONDANSETRON HCL 4 MG/2ML IJ SOLN
INTRAMUSCULAR | Status: DC | PRN
  Administered 2018-03-22: 4 mg via INTRAVENOUS

## 2018-03-22 MED ORDER — CHLORHEXIDINE GLUCONATE CLOTH 2 % EX PADS: 6.0000 | MEDICATED_PAD | Freq: Once | CUTANEOUS | Status: DC

## 2018-03-22 MED ORDER — LIDOCAINE 2% (20 MG/ML) 5 ML SYRINGE
INTRAMUSCULAR | Status: DC | PRN
  Administered 2018-03-22: 80 mg via INTRAVENOUS

## 2018-03-22 MED ORDER — MIDAZOLAM HCL 2 MG/2ML IJ SOLN
INTRAMUSCULAR | Status: AC
  Filled 2018-03-22: qty 2

## 2018-03-22 MED ORDER — BUPIVACAINE-EPINEPHRINE (PF) 0.5% -1:200000 IJ SOLN
INTRAMUSCULAR | Status: AC
  Filled 2018-03-22: qty 30

## 2018-03-22 MED ORDER — PROPOFOL 10 MG/ML IV BOLUS
INTRAVENOUS | Status: AC
  Filled 2018-03-22: qty 20

## 2018-03-22 MED ORDER — ONDANSETRON HCL 4 MG/2ML IJ SOLN
INTRAMUSCULAR | Status: AC
  Filled 2018-03-22: qty 2

## 2018-03-22 MED ORDER — LACTATED RINGERS IV SOLN
INTRAVENOUS | Status: DC
  Administered 2018-03-22: 10:00:00 via INTRAVENOUS

## 2018-03-22 MED ORDER — GABAPENTIN 300 MG PO CAPS
300.0000 mg | ORAL_CAPSULE | ORAL | Status: AC
  Administered 2018-03-22: 300 mg via ORAL
  Filled 2018-03-22: qty 1

## 2018-03-22 MED ORDER — LIDOCAINE HCL (PF) 1 % IJ SOLN
INTRAMUSCULAR | Status: AC
  Filled 2018-03-22: qty 30

## 2018-03-22 MED ORDER — MIDAZOLAM HCL 5 MG/5ML IJ SOLN
INTRAMUSCULAR | Status: DC | PRN
  Administered 2018-03-22: 1 mg via INTRAVENOUS

## 2018-03-22 MED ORDER — TRAMADOL HCL 50 MG PO TABS: 50.0000 mg | ORAL_TABLET | Freq: Four times a day (QID) | ORAL | 1 refills | Status: DC | PRN

## 2018-03-22 MED ORDER — FENTANYL CITRATE (PF) 100 MCG/2ML IJ SOLN
INTRAMUSCULAR | Status: AC
  Filled 2018-03-22: qty 2

## 2018-03-22 MED ORDER — SODIUM BICARBONATE 4 % IV SOLN
INTRAVENOUS | Status: AC
  Filled 2018-03-22: qty 5

## 2018-03-22 MED ORDER — ACETAMINOPHEN 500 MG PO TABS
1000.0000 mg | ORAL_TABLET | ORAL | Status: AC
  Administered 2018-03-22: 1000 mg via ORAL
  Filled 2018-03-22: qty 2

## 2018-03-22 MED ORDER — FENTANYL CITRATE (PF) 100 MCG/2ML IJ SOLN
INTRAMUSCULAR | Status: DC | PRN
  Administered 2018-03-22: 50 ug via INTRAVENOUS

## 2018-03-22 MED ORDER — PROPOFOL 10 MG/ML IV BOLUS
INTRAVENOUS | Status: DC | PRN
  Administered 2018-03-22: 150 mg via INTRAVENOUS

## 2018-03-22 MED ORDER — SUCCINYLCHOLINE CHLORIDE 200 MG/10ML IV SOSY
PREFILLED_SYRINGE | INTRAVENOUS | Status: DC | PRN
  Administered 2018-03-22: 100 mg via INTRAVENOUS

## 2018-03-22 MED ORDER — 0.9 % SODIUM CHLORIDE (POUR BTL) OPTIME
TOPICAL | Status: DC | PRN
  Administered 2018-03-22: 1000 mL

## 2018-03-22 MED ORDER — PHENYLEPHRINE 40 MCG/ML (10ML) SYRINGE FOR IV PUSH (FOR BLOOD PRESSURE SUPPORT)
PREFILLED_SYRINGE | INTRAVENOUS | Status: DC | PRN
  Administered 2018-03-22: 80 ug via INTRAVENOUS
  Administered 2018-03-22 (×2): 120 ug via INTRAVENOUS

## 2018-03-22 MED ORDER — CEFAZOLIN SODIUM-DEXTROSE 2-4 GM/100ML-% IV SOLN
2.0000 g | INTRAVENOUS | Status: AC
  Administered 2018-03-22: 2 g via INTRAVENOUS
  Filled 2018-03-22: qty 100

## 2018-03-22 MED ORDER — BUPIVACAINE-EPINEPHRINE 0.5% -1:200000 IJ SOLN
INTRAMUSCULAR | Status: DC | PRN
  Administered 2018-03-22: 30 mL

## 2018-03-22 MED ORDER — FENTANYL CITRATE (PF) 100 MCG/2ML IJ SOLN
25.0000 ug | INTRAMUSCULAR | Status: DC | PRN
  Administered 2018-03-22: 50 ug via INTRAVENOUS

## 2018-03-22 MED ORDER — PROMETHAZINE HCL 25 MG/ML IJ SOLN: 6.2500 mg | INTRAMUSCULAR | Status: DC | PRN

## 2018-03-22 SURGICAL SUPPLY — 30 items
BLADE HEX COATED 2.75 (ELECTRODE) ×3 IMPLANT
BLADE SURG 15 STRL LF DISP TIS (BLADE) ×1 IMPLANT
BLADE SURG 15 STRL SS (BLADE) ×2
BLADE SURG SZ10 CARB STEEL (BLADE) ×3 IMPLANT
COVER SURGICAL LIGHT HANDLE (MISCELLANEOUS) ×3 IMPLANT
DERMABOND ADVANCED (GAUZE/BANDAGES/DRESSINGS) ×2
DERMABOND ADVANCED .7 DNX12 (GAUZE/BANDAGES/DRESSINGS) ×1 IMPLANT
DRAPE LAPAROTOMY T 98X78 PEDS (DRAPES) ×3 IMPLANT
ELECT PENCIL ROCKER SW 15FT (MISCELLANEOUS) ×3 IMPLANT
ELECT REM PT RETURN 15FT ADLT (MISCELLANEOUS) ×3 IMPLANT
GAUZE SPONGE 4X4 12PLY STRL (GAUZE/BANDAGES/DRESSINGS) ×3 IMPLANT
GLOVE BIOGEL PI IND STRL 6.5 (GLOVE) ×2 IMPLANT
GLOVE BIOGEL PI IND STRL 7.0 (GLOVE) ×3 IMPLANT
GLOVE BIOGEL PI INDICATOR 6.5 (GLOVE) ×4
GLOVE BIOGEL PI INDICATOR 7.0 (GLOVE) ×6
GOWN STRL REUS W/TWL LRG LVL3 (GOWN DISPOSABLE) ×6 IMPLANT
GOWN STRL REUS W/TWL XL LVL3 (GOWN DISPOSABLE) ×3 IMPLANT
KIT BASIN OR (CUSTOM PROCEDURE TRAY) ×3 IMPLANT
MARKER SKIN DUAL TIP RULER LAB (MISCELLANEOUS) ×3 IMPLANT
NEEDLE HYPO 25X1 1.5 SAFETY (NEEDLE) ×3 IMPLANT
PACK BASIC VI WITH GOWN DISP (CUSTOM PROCEDURE TRAY) ×3 IMPLANT
SOL PREP POV-IOD 4OZ 10% (MISCELLANEOUS) ×3 IMPLANT
SPONGE LAP 4X18 RFD (DISPOSABLE) ×3 IMPLANT
SUT MNCRL AB 4-0 PS2 18 (SUTURE) ×3 IMPLANT
SUT VIC AB 3-0 SH 18 (SUTURE) ×3 IMPLANT
SUT VIC AB 3-0 SH 27 (SUTURE) ×2
SUT VIC AB 3-0 SH 27XBRD (SUTURE) ×1 IMPLANT
SYR CONTROL 10ML LL (SYRINGE) ×3 IMPLANT
TOWEL OR 17X26 10 PK STRL BLUE (TOWEL DISPOSABLE) ×3 IMPLANT
YANKAUER SUCT BULB TIP 10FT TU (MISCELLANEOUS) ×3 IMPLANT

## 2018-03-22 NOTE — Discharge Instructions (Signed)
May remove bandage and shower in 48 hours. No activity limitations.

## 2018-03-22 NOTE — Anesthesia Procedure Notes (Signed)
Procedure Name: Intubation Date/Time: 03/22/2018 11:59 AM Performed by: [place] Glynn Freas C, CRNA Pre-anesthesia Checklist: Patient identified, Emergency Drugs available, Suction available and Patient being monitored Patient Re-evaluated:Patient Re-evaluated prior to induction Oxygen Delivery Method: Circle system utilized Preoxygenation: Pre-oxygenation with 100% oxygen Induction Type: IV induction Ventilation: Mask ventilation without difficulty Laryngoscope Size: Mac and 3 Grade View: Grade I Tube type: Oral Tube size: 7.0 mm Number of attempts: 1 Airway Equipment and Method: Stylet and Oral airway Placement Confirmation: ETT inserted through vocal cords under direct vision,  positive ETCO2 and breath sounds checked- equal and bilateral Secured at: 22 cm Tube secured with: Tape Dental Injury: Teeth and Oropharynx as per pre-operative assessment  Comments: Intubation by EMT student

## 2018-03-22 NOTE — Anesthesia Postprocedure Evaluation (Signed)
Anesthesia Post Note  Patient: Patricia [name]  Procedure(s) Performed: EXCISION OF BACK LIPOMA (N/A )     Patient location during evaluation: PACU Anesthesia Type: General Level of consciousness: awake and alert Pain management: pain level controlled Vital Signs Assessment: post-procedure vital signs reviewed and stable Respiratory status: spontaneous breathing, nonlabored ventilation, respiratory function stable and patient connected to nasal cannula oxygen Cardiovascular status: blood pressure returned to baseline and stable Postop Assessment: no apparent nausea or vomiting Anesthetic complications: no    Last Vitals:  Vitals:   03/22/18 1315 03/22/18 1330  BP: 126/69 (!) 143/72  Pulse: 71 73  Resp: 16 20  Temp:    SpO2: 100% 100%    Last Pain:  Vitals:   03/22/18 1330  TempSrc:   PainSc: 0-No pain                 Richardson Dubree S

## 2018-03-22 NOTE — Transfer of Care (Signed)
Immediate Anesthesia Transfer of Care Note  Patient: Patricia [name]  Procedure(s) Performed: EXCISION OF BACK LIPOMA (N/A )  Patient Location: PACU  Anesthesia Type:General  Level of Consciousness: awake, alert  and oriented  Airway & Oxygen Therapy: Patient Spontanous Breathing and Patient connected to nasal cannula oxygen  Post-op Assessment: Report given to RN and Post -op Vital signs reviewed and stable  Post vital signs: Reviewed and stable  Last Vitals:  Vitals Value Taken Time  BP 116/66 03/22/2018  1:10 PM  Temp    Pulse 71 03/22/2018  1:15 PM  Resp 16 03/22/2018  1:15 PM  SpO2 100 % 03/22/2018  1:15 PM  Vitals shown include unvalidated device data.  Last Pain:  Vitals:   03/22/18 1005  TempSrc:   PainSc: 0-No pain         Complications: No apparent anesthesia complications

## 2018-03-22 NOTE — H&P (Signed)
History of Present Illness  The patient is a 62 year old female who presents with a soft tissue mass. Patient is referred by Dr. Wynetta Emery for an enlarging soft tissue mass on her right mid back. This has been present for several years. She has been told previously was a lipoma. In the last couple years it has definitely enlarged. It is now causing some discomfort due to pressure. She has not had any surgery in the area previously or any other similar masses. Her history is significant for diabetes with neuropathy as well as hypertension and history of CHF.   Diagnostic Studies History  Colonoscopy  within last year  Allergies Sulfa Drugs  Allergies Reconciled   Medication History  Atorvastatin Calcium (20MG  Tablet, Oral) Active. Escitalopram Oxalate (20MG  Tablet, Oral) Active. Furosemide (40MG  Tablet, Oral) Active. Gabapentin (100MG  Capsule, Oral) Active. GlipiZIDE (10MG  Tablet, Oral) Active. HydrALAZINE HCl (100MG  Tablet, Oral) Active. Isosorbide Mononitrate ER (60MG  Tablet ER 24HR, Oral) Active. Lantus SoloStar (100UNIT/ML Soln Pen-inj, Subcutaneous) Active. Lisinopril (5MG  Tablet, Oral) Active. Carvedilol (25MG  Tablet, Oral) Active. Aspirin (81MG  Tablet DR, Oral) Active. Medications Reconciled  Social History Caffeine use  Coffee. No alcohol use  No drug use  Tobacco use  Never smoker.  Family History  Family history unknown  First Degree Relatives   Pregnancy / Birth History  Age at menarche  55 years. Irregular periods   Other Problems  Anxiety Disorder  Chronic Renal Failure Syndrome  Diabetes Mellitus  High blood pressure     Review of Systems  General Not Present- Appetite Loss, Chills, Fatigue, Fever, Night Sweats, Weight Gain and Weight Loss. Skin Not Present- Change in Wart/Mole, Dryness, Hives, Jaundice, New Lesions, Non-Healing Wounds, Rash and Ulcer. HEENT Present- Seasonal Allergies and Wears glasses/contact lenses. Not  Present- Earache, Hearing Loss, Hoarseness, Nose Bleed, Oral Ulcers, Ringing in the Ears, Sinus Pain, Sore Throat, Visual Disturbances and Yellow Eyes. Respiratory Not Present- Bloody sputum, Chronic Cough, Difficulty Breathing, Snoring and Wheezing. Breast Not Present- Breast Mass, Breast Pain, Nipple Discharge and Skin Changes. Gastrointestinal Not Present- Abdominal Pain, Bloating, Bloody Stool, Change in Bowel Habits, Chronic diarrhea, Constipation, Difficulty Swallowing, Excessive gas, Gets full quickly at meals, Hemorrhoids, Indigestion, Nausea, Rectal Pain and Vomiting. Female Genitourinary Not Present- Frequency, Nocturia, Painful Urination, Pelvic Pain and Urgency. Musculoskeletal Present- Back Pain. Not Present- Joint Pain, Joint Stiffness, Muscle Pain, Muscle Weakness and Swelling of Extremities. Neurological Not Present- Decreased Memory, Fainting, Headaches, Numbness, Seizures, Tingling, Tremor, Trouble walking and Weakness. Psychiatric Not Present- Anxiety, Bipolar, Change in Sleep Pattern, Depression, Fearful and Frequent crying. Hematology Not Present- Blood Thinners, Easy Bruising, Excessive bleeding, Gland problems, HIV and Persistent Infections.  Vitals   Weight: 183.5 lb Height: 64in Body Surface Area: 1.89 m Body Mass Index: 31.5 kg/m  Temp.: 97.86F(Oral)  Pulse: 80 (Regular)  BP: 132/70 (Sitting, Left Arm, Standard)       Physical Exam  The physical exam findings are as follows: Note:General: Alert, mildly obese Afro-American female, in no distress Skin: Warm and dry without rash or infection. HEENT: No palpable masses or thyromegaly. Sclera nonicteric. Healed scar right cheek Lymph nodes: No cervical, supraclavicular, nodes palpable. Lungs: Breath sounds clear and equal. No wheezing or increased work of breathing. Cardiovascular: Regular rate and rhythm without murmer. Trace lower extremity edema. Back: On the right side of her mid back  transversely oriented is a 9 x 5 cm discrete fleshy but somewhat firm soft tissue mass. No overlying skin changes. Extremities: Trace lower extremity edema,  no joint swelling or deformity. No chronic venous stasis changes. Neurologic: Alert and fully oriented. Gait normal. No focal weakness. Psychiatric: Normal mood and affect. 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 Due to enlargement and symptoms I think this should be removed and she would very much like it removed. She has some significant comorbidities including diabetes, CRI and CHF. We will need to obtain cardiac clearance. I discussed the procedure under general anesthesia an outpatient and slight risks of bleeding, infection and seroma and anesthetic risks. All questions were answered and she desires to proceed. Current Plans I recommended obtaining preoperative cardiac clearance. I am concerned about the health of the patient and the ability to tolerate the operation. Therefore, we will request clearance by cardiology to better assess operative risk & see if a reevaluation, further workup, etc is needed. Also recommendations on how medications such as for anticoagulation and blood pressure should be managed/held/restarted after surgery. Excision of lipoma right back under general anesthesia as an outpatient

## 2018-03-22 NOTE — Anesthesia Preprocedure Evaluation (Addendum)
Anesthesia Evaluation  Patient identified by MRN, date of birth, ID band Patient awake    Reviewed: Allergy & Precautions, NPO status , Patient's Chart, lab work & pertinent test results  Airway Mallampati: II  TM Distance: >3 FB Neck ROM: Full    Dental no notable dental hx.    Pulmonary neg pulmonary ROS,    Pulmonary exam normal breath sounds clear to auscultation       Cardiovascular hypertension, +CHF  Normal cardiovascular exam Rhythm:Regular Rate:Normal   The left ventricular ejection fraction is moderately decreased (30-44%).  Nuclear stress EF: 40%.  The study is normal.  This is a low risk study.  There was no ST segment deviation noted during stress.   Normal pharmacologic nuclear stress test with no evidence for prior infarct or ischemia. LVEF 40% with diffuse hypokinesis.    Neuro/Psych negative neurological ROS  negative psych ROS   GI/Hepatic negative GI ROS, Neg liver ROS,   Endo/Other  diabetes  Renal/GU Renal InsufficiencyRenal disease  negative genitourinary   Musculoskeletal negative musculoskeletal ROS (+)   Abdominal   Peds negative pediatric ROS (+)  Hematology  (+) anemia ,   Anesthesia Other Findings   Reproductive/Obstetrics negative OB ROS                            Anesthesia Physical Anesthesia Plan  ASA: IV  Anesthesia Plan: General   Post-op Pain Management:    Induction: Intravenous  PONV Risk Score and Plan: 3 and Ondansetron  Airway Management Planned: Oral ETT  Additional Equipment:   Intra-op Plan:   Post-operative Plan: Extubation in OR  Informed Consent: I have reviewed the patients History and Physical, chart, labs and discussed the procedure including the risks, benefits and alternatives for the proposed anesthesia with the patient or authorized representative who has indicated his/her understanding and acceptance.    Dental advisory given  Plan Discussed with: CRNA and Surgeon  Anesthesia Plan Comments:         Anesthesia Quick Evaluation

## 2018-03-22 NOTE — Progress Notes (Signed)
Gold ring placed on lt ring finger upon arrival to pacu.

## 2018-03-22 NOTE — Op Note (Signed)
Preoperative Diagnosis: Back lipoma  Postoprative Diagnosis: Back lipoma  Procedure: Procedure(s): EXCISION OF BACK LIPOMA   Surgeon: Excell Seltzer T   Assistants: None  Anesthesia:  General endotracheal anesthesia  Indications: Patient is a 62 year old female with a at least several year history of gradually enlarging deep subcutaneous mass on her right back which now is causing discomfort.  Examination reveals a somewhat firm fleshy 9 x 5 cm deep subcutaneous mass over the right flank.  We discussed this appears consistent with a enlarging symptomatic lipoma and discussed excision under general anesthesia including indications and risks and recovery.  She understands and agrees to proceed.    Procedure Detail: Patient was brought to the operating room, placed in the supine position on the operating table, and general endotracheal anesthesia induced.  She was carefully positioned in the left lateral decubitus position padded with a beanbag in the left flank and back widely sterilely prepped and draped.  Patient timeout was performed and correct procedure verified.  She received preoperative IV antibiotics.  I made an approximately 7 cm incision in a skin crease directly over the mass.  Dissection was carried down through the subtenons tissue with cautery.  The mass was firmly adherent and somewhat indistinguishable from subcutaneous tissue in some areas and in other areas shelled away easily from surrounding soft tissue.  The anterior portion of the mass was exposed with partially blunt dissection and partially cautery dissection.  It was then dissected up out of the deep subcutaneous tissue lying back near the fascia again some areas showing up easily and other areas having to be dissected with cautery staying outside the gross mass in order to completely resected.  This was completely removed.  Hemostasis was obtained with cautery.  Soft tissue was infiltrated with Marcaine.  Deep and  superficial subcutaneous tissue was closed with 3-0 Vicryl and skin with running some particular 4-0 Monocryl and Dermabond.  Sponge needle and instrument counts were correct.    Findings: As above  Estimated Blood Loss:  Minimal         Drains: None  Blood Given: none          Specimens: Soft tissue mass (lipoma) back        Complications:  * No complications entered in OR log *         Disposition: PACU - hemodynamically stable.         Condition: stable

## 2018-03-22 NOTE — Interval H&P Note (Signed)
History and Physical Interval Note:  03/22/2018 11:46 AM  Patricia [name]  has presented today for surgery, with the diagnosis of Back lipoma  The various methods of treatment have been discussed with the patient and family. After consideration of risks, benefits and other options for treatment, the patient has consented to  Procedure(s): EXCISION OF BACK LIPOMA (N/A) as a surgical intervention .  The patient's history has been reviewed, patient examined, no change in status, stable for surgery.  I have reviewed the patient's chart and labs.  Questions were answered to the patient's satisfaction.     Darene Lamer Mana Morison

## 2018-03-23 ENCOUNTER — Encounter (HOSPITAL_COMMUNITY): Payer: Self-pay | Admitting: General Surgery

## 2018-03-23 MED FILL — ISOSORBIDE MN ER 60 MG TAB: 60 | 30 days supply | Qty: 30 | Fill #8

## 2018-03-23 MED FILL — glipiZIDE 10 MG TABS: 10 | 30 days supply | Qty: 60 | Fill #2

## 2018-03-23 MED FILL — ATORVASTATIN 20 MG TABLET: 20 | 30 days supply | Qty: 15 | Fill #2

## 2018-03-23 MED FILL — GABAPENTIN 100 MG CAPSULE: 100 | 30 days supply | Qty: 30 | Fill #2

## 2018-03-25 ENCOUNTER — Other Ambulatory Visit: Payer: Self-pay | Admitting: Pharmacist

## 2018-03-25 MED ORDER — INSULIN PEN NEEDLE 32G X 4 MM MISC: 3 refills | Status: AC

## 2018-03-25 MED FILL — TRUEPLUS PEN NDL 32GX5/32": 32G X 4 MM | 30 days supply | Qty: 100 | Fill #0

## 2018-03-25 MED FILL — TRUEPLUS PEN NDL 32GX5/32: 32G X 4 MM | 30 days supply | Qty: 100 | Fill #0

## 2018-03-28 ENCOUNTER — Other Ambulatory Visit: Payer: Self-pay

## 2018-03-28 MED ORDER — INSULIN PEN NEEDLE 31G X 5 MM MISC: 12 refills | Status: AC

## 2018-03-30 ENCOUNTER — Other Ambulatory Visit: Payer: Self-pay | Admitting: Internal Medicine

## 2018-03-30 DIAGNOSIS — F329 Major depressive disorder, single episode, unspecified: Secondary | ICD-10-CM

## 2018-03-30 DIAGNOSIS — F419 Anxiety disorder, unspecified: Principal | ICD-10-CM

## 2018-04-08 ENCOUNTER — Telehealth: Payer: Self-pay | Admitting: Cardiovascular Disease

## 2018-04-08 MED FILL — ESCITALOPRAM 20 MG TABLET: 20 | 30 days supply | Qty: 30 | Fill #0

## 2018-04-08 NOTE — Telephone Encounter (Signed)
Received records from [place] on 04/08/18, Appt 06/14/18 @ 9:20aM. [place]

## 2018-04-12 ENCOUNTER — Ambulatory Visit (HOSPITAL_COMMUNITY)
Admission: RE | Admit: 2018-04-12 | Discharge: 2018-04-12 | Disposition: A | Discharge status: Home / Self Care | Payer: Medicaid Other | Source: Ambulatory Visit | Attending: Nephrology | Admitting: Nephrology

## 2018-04-12 VITALS — BP 122/56 | HR 84 | Temp 97.8°F

## 2018-04-12 DIAGNOSIS — D631 Anemia in chronic kidney disease: Secondary | ICD-10-CM | POA: Diagnosis present

## 2018-04-12 DIAGNOSIS — N184 Chronic kidney disease, stage 4 (severe): Secondary | ICD-10-CM

## 2018-04-12 LAB — RENAL FUNCTION PANEL
Albumin: 2.8 g/dL — ABNORMAL LOW (ref 3.5–5.0)
Anion gap: 12 (ref 5–15)
BUN: 93 mg/dL — ABNORMAL HIGH (ref 6–20)
CALCIUM: 7.9 mg/dL — AB (ref 8.9–10.3)
CHLORIDE: 106 mmol/L (ref 98–111)
CO2: 19 mmol/L — ABNORMAL LOW (ref 22–32)
CREATININE: 6.66 mg/dL — AB (ref 0.44–1.00)
GFR, EST AFRICAN AMERICAN: 7 mL/min — AB (ref >60)
GFR, EST NON AFRICAN AMERICAN: 6 mL/min — AB (ref >60)
Glucose, Bld: 120 mg/dL — ABNORMAL HIGH (ref 70–99)
PHOSPHORUS: 8.7 mg/dL — AB (ref 2.5–4.6)
Potassium: 4.3 mmol/L (ref 3.5–5.1)
SODIUM: 137 mmol/L (ref 135–145)

## 2018-04-12 LAB — FERRITIN: FERRITIN: 385 ng/mL — AB (ref 11–307)

## 2018-04-12 LAB — IRON AND TIBC
Iron: 102 ug/dL (ref 28–170)
Saturation Ratios: 40 % — ABNORMAL HIGH (ref 10.4–31.8)
TIBC: 258 ug/dL (ref 250–450)
UIBC: 156 ug/dL

## 2018-04-12 LAB — POCT HEMOGLOBIN-HEMACUE: HEMOGLOBIN: 9.9 g/dL — AB (ref 12.0–15.0)

## 2018-04-12 MED ORDER — DARBEPOETIN ALFA 200 MCG/0.4ML IJ SOSY
PREFILLED_SYRINGE | INTRAMUSCULAR | Status: AC
  Filled 2018-04-12: qty 0.4

## 2018-04-12 MED ORDER — DARBEPOETIN ALFA 200 MCG/0.4ML IJ SOSY
200.0000 ug | PREFILLED_SYRINGE | INTRAMUSCULAR | Status: DC
  Administered 2018-04-12: 200 ug via SUBCUTANEOUS

## 2018-04-18 ENCOUNTER — Ambulatory Visit: Payer: Medicaid Other | Admitting: Internal Medicine

## 2018-04-21 ENCOUNTER — Other Ambulatory Visit: Payer: Self-pay | Admitting: Student

## 2018-04-24 ENCOUNTER — Ambulatory Visit (HOSPITAL_COMMUNITY)
Admission: EM | Admit: 2018-04-24 | Discharge: 2018-04-24 | Disposition: A | Discharge status: Home / Self Care | Payer: Medicaid Other | Attending: Family Medicine | Admitting: Family Medicine

## 2018-04-24 ENCOUNTER — Encounter (HOSPITAL_COMMUNITY): Payer: Self-pay | Admitting: *Deleted

## 2018-04-24 DIAGNOSIS — I509 Heart failure, unspecified: Secondary | ICD-10-CM | POA: Diagnosis not present

## 2018-04-24 DIAGNOSIS — I130 Hypertensive heart and chronic kidney disease with heart failure and stage 1 through stage 4 chronic kidney disease, or unspecified chronic kidney disease: Secondary | ICD-10-CM | POA: Diagnosis not present

## 2018-04-24 DIAGNOSIS — R300 Dysuria: Secondary | ICD-10-CM | POA: Insufficient documentation

## 2018-04-24 DIAGNOSIS — R109 Unspecified abdominal pain: Secondary | ICD-10-CM

## 2018-04-24 DIAGNOSIS — E1122 Type 2 diabetes mellitus with diabetic chronic kidney disease: Secondary | ICD-10-CM | POA: Insufficient documentation

## 2018-04-24 DIAGNOSIS — E669 Obesity, unspecified: Secondary | ICD-10-CM | POA: Diagnosis not present

## 2018-04-24 DIAGNOSIS — F419 Anxiety disorder, unspecified: Secondary | ICD-10-CM | POA: Insufficient documentation

## 2018-04-24 DIAGNOSIS — N184 Chronic kidney disease, stage 4 (severe): Secondary | ICD-10-CM | POA: Diagnosis not present

## 2018-04-24 LAB — POCT URINALYSIS DIP (DEVICE)
BILIRUBIN URINE: NEGATIVE
GLUCOSE, UA: NEGATIVE mg/dL
Hgb urine dipstick: NEGATIVE
Ketones, ur: NEGATIVE mg/dL
Leukocytes, UA: NEGATIVE
Nitrite: NEGATIVE
Protein, ur: >=300 mg/dL — AB
Specific Gravity, Urine: 1.025 (ref 1.005–1.030)
UROBILINOGEN UA: 0.2 mg/dL (ref 0.0–1.0)
pH: 5 (ref 5.0–8.0)

## 2018-04-24 MED ORDER — CEPHALEXIN 250 MG/5ML PO SUSR: 250.0000 mg | Freq: Three times a day (TID) | ORAL | 0 refills | Status: AC

## 2018-04-24 NOTE — ED Provider Notes (Signed)
04/24/2018 3:52 PM   DOB: [birth year] / MRN: [id number]  SUBJECTIVE:  Patricia [name] is a 62 y.o. female presenting for dyuria and flank pain x 2 days and worsening.  No on dialysis.  Diabetes well controlled at last check.  No new meds.  No fever or chills. Was in bed all day yesterday.   She is allergic to codone [hydrocodone]; norvasc [amlodipine besylate]; and sulfa antibiotics.   She  has a past medical history of Allergy, Anemia, Anxiety, Blood transfusion without reported diagnosis, Cardiomyopathy ([place]) (11/2016), CHF (congestive heart failure) ([place]), CKD (chronic kidney disease), stage IV ([place]), Depression, Diabetes mellitus without complication ([place]), Dyspnea, Hypertension, and Obesity.    She  reports that she has never smoked. She has never used smokeless tobacco. She reports that she does not drink alcohol or use drugs. She  has no sexual activity history on file. The patient  has a past surgical history that includes Cesarean section (1982, 1988); Abdominal hysterectomy; Breast surgery; Reduction mammaplasty (Bilateral); Lipoma excision; and Lipoma excision (N/A, 03/22/2018).  Her family history includes Diabetes in her brother; Heart Problems in her maternal grandmother; Heart disease in her maternal grandfather; Kidney disease in her brother; Stomach cancer in her brother.  Review of Systems  Constitutional: Negative for chills, diaphoresis and fever.  Eyes: Negative.   Respiratory: Negative for cough, hemoptysis, sputum production, shortness of breath and wheezing.   Cardiovascular: Negative for chest pain, orthopnea and leg swelling.  Gastrointestinal: Negative for abdominal pain, blood in stool, constipation, diarrhea, heartburn, melena, nausea and vomiting.  Genitourinary: Positive for dysuria, flank pain and urgency. Negative for frequency and hematuria.  Skin: Negative for rash.  Neurological: Negative for dizziness, sensory change, speech change, focal weakness and headaches.     OBJECTIVE:  BP 126/63   Pulse 83   Temp 97.9 F (36.6 C)   SpO2 99%   Wt Readings from Last 3 Encounters:  03/22/18 180 lb (81.6 kg)  03/16/18 178 lb (80.7 kg)  12/21/17 180 lb (81.6 kg)   Temp Readings from Last 3 Encounters:  04/24/18 97.9 F (36.6 C)  04/12/18 97.8 F (36.6 C) (Oral)  03/22/18 97.6 F (36.4 C)   BP Readings from Last 3 Encounters:  04/24/18 126/63  04/12/18 (!) 122/56  03/22/18 130/73   Pulse Readings from Last 3 Encounters:  04/24/18 83  04/12/18 84  03/22/18 75    Physical Exam  Cardiovascular: Regular rhythm, S1 normal, S2 normal, normal heart sounds and intact distal pulses. Exam reveals no gallop, no friction rub and no decreased pulses.  No murmur heard. Pulmonary/Chest: Effort normal. No stridor. No respiratory distress. She has no wheezes. She has no rales.  Abdominal: Soft. Bowel sounds are normal. She exhibits no distension and no mass. There is no tenderness. There is no rebound and no guarding. No hernia.  Musculoskeletal: She exhibits no edema.    Results for orders placed or performed during the hospital encounter of 04/24/18 (from the past 72 hour(s))  POCT urinalysis dip (device)     Status: Abnormal   Collection Time: 04/24/18  3:29 PM  Result Value Ref Range   Glucose, UA NEGATIVE NEGATIVE mg/dL   Bilirubin Urine NEGATIVE NEGATIVE   Ketones, ur NEGATIVE NEGATIVE mg/dL   Specific Gravity, Urine 1.025 1.005 - 1.030   Hgb urine dipstick NEGATIVE NEGATIVE   pH 5.0 5.0 - 8.0   Protein, ur >=300 (A) NEGATIVE mg/dL   Urobilinogen, UA 0.2 0.0 - 1.0 mg/dL  Nitrite NEGATIVE NEGATIVE   Leukocytes, UA NEGATIVE NEGATIVE    Comment: Biochemical Testing Only. Please order routine urinalysis from main lab if confirmatory testing is needed.   Lab Results  Component Value Date   HGBA1C 5.7 12/09/2017   Lab Results  Component Value Date   CREATININE 6.66 (H) 04/12/2018   BUN 93 (H) 04/12/2018   NA 137 04/12/2018   K 4.3  04/12/2018   CL 106 04/12/2018   CO2 19 (L) 04/12/2018      No results found.  ASSESSMENT AND PLAN:   Dysuria - Urine culture out. Will cover for UTI with renal involvment however exam here looks normal. No vaginal atrophy.  Acute right flank pain  Discharge Instructions   None        The patient is advised to call or return to clinic if she does not see an improvement in symptoms, or to seek the care of the closest emergency department if she worsens with the above plan.   Philis Fendt, MHS, PA-C 04/24/2018 3:52 PM   Tereasa Coop, PA-C 04/24/18 1552

## 2018-04-24 NOTE — ED Triage Notes (Signed)
C/O dysuria, polyuria, low back pain since yesterday without fevers.

## 2018-04-26 ENCOUNTER — Other Ambulatory Visit: Payer: Self-pay | Admitting: Internal Medicine

## 2018-04-26 DIAGNOSIS — Z794 Long term (current) use of insulin: Secondary | ICD-10-CM

## 2018-04-26 DIAGNOSIS — E785 Hyperlipidemia, unspecified: Secondary | ICD-10-CM

## 2018-04-26 DIAGNOSIS — E1165 Type 2 diabetes mellitus with hyperglycemia: Secondary | ICD-10-CM

## 2018-04-26 LAB — URINE CULTURE: Culture: 40000 — AB

## 2018-04-26 MED FILL — ISOSORBIDE MN ER 60 MG TAB: 60 | 30 days supply | Qty: 30 | Fill #9

## 2018-04-26 MED FILL — ATORVASTATIN 20 MG TABLET: 20 | 30 days supply | Qty: 15 | Fill #0

## 2018-04-26 MED FILL — glipiZIDE 10 MG TABS: 10 | 30 days supply | Qty: 60 | Fill #0

## 2018-05-10 ENCOUNTER — Ambulatory Visit (HOSPITAL_COMMUNITY)
Admission: RE | Admit: 2018-05-10 | Discharge: 2018-05-10 | Disposition: A | Discharge status: Home / Self Care | Payer: Medicaid Other | Source: Ambulatory Visit | Attending: Nephrology | Admitting: Nephrology

## 2018-05-10 VITALS — BP 114/56 | Temp 98.9°F | Resp 15 | Ht 65.0 in | Wt 180.0 lb

## 2018-05-10 DIAGNOSIS — D631 Anemia in chronic kidney disease: Secondary | ICD-10-CM | POA: Diagnosis present

## 2018-05-10 DIAGNOSIS — N184 Chronic kidney disease, stage 4 (severe): Secondary | ICD-10-CM | POA: Insufficient documentation

## 2018-05-10 MED ORDER — DARBEPOETIN ALFA 200 MCG/0.4ML IJ SOSY
200.0000 ug | PREFILLED_SYRINGE | INTRAMUSCULAR | Status: DC
  Administered 2018-05-10: 200 ug via SUBCUTANEOUS

## 2018-05-10 MED ORDER — DARBEPOETIN ALFA 200 MCG/0.4ML IJ SOSY
PREFILLED_SYRINGE | INTRAMUSCULAR | Status: AC
  Filled 2018-05-10: qty 0.4

## 2018-05-11 LAB — POCT HEMOGLOBIN-HEMACUE: Hemoglobin: 9.2 g/dL — ABNORMAL LOW (ref 12.0–15.0)

## 2018-05-20 ENCOUNTER — Telehealth: Payer: Self-pay | Admitting: Cardiovascular Disease

## 2018-05-20 NOTE — Telephone Encounter (Signed)
New Message          Pt c/o Shortness Of Breath: STAT if SOB developed within the last 24 hours or pt is noticeably SOB on the phone  1. Are you currently SOB (can you hear that pt is SOB on the phone)?No  2. How long have you been experiencing SOB? Yesterday  3. Are you SOB when sitting or when up moving around? Moving around  4. Are you currently experiencing any other symptoms? No     Patient is asking if she needs to come sooner, Pls advise

## 2018-05-20 NOTE — Telephone Encounter (Signed)
Returned call to patient of Dr. Loletha Grayer who has HF. She started having SOB yesterday. She checked her mail yesterday, went to the store and got tired. She states if she is out doing a whole lot, she gets "tired real quick" and short of breath. She denies swelling, weight gain, chest pain, palpitations. She has NO shortness of breath at rest. She reports she sleeps pretty good at night but does take tylenol for arthritis before sleep. She is not using any extra pillows for sleep. Her med list is up to date and she states she is compliant with meds   She states her SBP was 135 yesterday.   Offered sooner APP appointment (does not see Dr. Loletha Grayer until Sept) and she is agreeable. Scheduled for 05/23/18 @ 3pm with Arnold Long DNP

## 2018-05-22 NOTE — Progress Notes (Signed)
Cardiology Office Note   Date:  05/23/2018   ID:  Patricia [name], Patricia [name] 19-Feb-1956, MRN [id number]  PCP:  Ladell Pier, MD  Cardiologist: Dr. Sallyanne Kuster Chief Complaint  Patient presents with  . Follow-up     History of Present Illness: Patricia [name] is a 62 y.o. female who presents for ongoing assessment and management of hypertension, with history of systolic CHF, most recent EF of 25% to 30% on 11/30/2016, she did not undergo cardiac catheterization due to poor renal function, it was felt that she may have possible Takotsubo variant which occurred after the death of her sister, other history includes insulin-dependent diabetes, depression, and anxiety.\  She was last seen by Rosaria Ferries, PA on 11/15/2017, she was not found to be volume overloaded, she continued to have baseline dyspnea on exertion, she was continued on beta-blocker indoor and ACE inhibitor.  She was to follow-up with her primary care physician Dr. Joelyn Oms for ongoing labs and noncardiac evaluation.  She was seen in the emergency room on 04/24/2018 for UTI and treated.  The patient called our office on 05/20/2012 with complaints of shortness of breath and generalized fatigue while out shopping.  She denied weight gain swelling or chest pain.  She is here for evaluation of her symptoms.  She has had significant weakness and dizziness over the last few days. She is easily tired. Denies dyspnea. She stopped taking isosorbide 60 mg today because each time she took it, the symptoms worsened. Since stopping the nitrates she has felt better. BP is risen though.   She has been recommended for peritoneal dialysis but has refused this so far. She is followed by [facility]. She also wishes to change  PCP as she has lost confidence in her current PCP.  Past Medical History:  Diagnosis Date  . Allergy   . Anemia   . Anxiety   . Blood transfusion without reported diagnosis   . Cardiomyopathy ([place]) 11/2016   no ischemic eval due to  CKD  . CHF (congestive heart failure) ([place])   . CKD (chronic kidney disease), stage IV ([place])   . Depression   . Diabetes mellitus without complication ([place])    type 2  . Dyspnea   . Hypertension   . Obesity     Past Surgical History:  Procedure Laterality Date  . ABDOMINAL HYSTERECTOMY    . BREAST SURGERY     reduction  . [place]  . LIPOMA EXCISION     back  Dr. Excell Seltzer 03-22-18  . LIPOMA EXCISION N/A 03/22/2018   Procedure: EXCISION OF BACK LIPOMA;  Surgeon: Excell Seltzer, MD;  Location: [facility];  Service: General;  Laterality: N/A;  . REDUCTION MAMMAPLASTY Bilateral      Current Outpatient Medications  Medication Sig Dispense Refill  . aspirin EC 81 MG tablet Take 1 tablet (81 mg total) by mouth daily. 90 tablet 3  . atorvastatin (LIPITOR) 20 MG tablet Take 0.5 tablets (10 mg total) by mouth daily. MUST MAKE APPT FOR FURTHER REFILLS 15 tablet 0  . Bee Pollen 550 MG CAPS Take 1 capsule by mouth daily.    Marland Kitchen bismuth subsalicylate (PEPTO BISMOL) 262 MG/15ML suspension Take 30 mLs by mouth every 6 (six) hours as needed for indigestion (as needed).     . calcitRIOL (ROCALTROL) 0.25 MCG capsule Take 1 capsule by mouth daily.  11  . calcium carbonate (TUMS - DOSED IN MG ELEMENTAL CALCIUM) 500 MG chewable tablet Chew  1 tablet by mouth 3 (three) times daily with meals.    . carvedilol (COREG) 25 MG tablet TAKE 1 TABLET BY MOUTH TWICE DAILY WITH A MEAL 60 tablet 11  . escitalopram (LEXAPRO) 20 MG tablet TAKE 1 TABLET BY MOUTH AT BEDTIME. 30 tablet 2  . furosemide (LASIX) 40 MG tablet TAKE 1 TABLET BY MOUTH 2 TIMES DAILY. 60 tablet 1  . gabapentin (NEURONTIN) 100 MG capsule Take 1 capsule (100 mg total) by mouth at bedtime. 90 capsule 3  . Garlic 536 MG TABS Take 1 tablet by mouth daily.    Marland Kitchen glipiZIDE (GLUCOTROL) 10 MG tablet TAKE 1 TABLET BY MOUTH 2 TIMES DAILY BEFORE A MEAL. MUST MAKE APPT FOR FURTHER REFILLS 60 tablet 0  . hydrALAZINE (APRESOLINE) 100 MG  tablet Take 1 tablet (100 mg total) by mouth 2 (two) times daily. 60 tablet 0  . insulin aspart (NOVOLOG) 100 UNIT/ML injection 3 units before meals. (Patient taking differently: Inject 5 Units into the skin 3 (three) times daily as needed for high blood sugar. 5 units before meals.) 60 mL 3  . Insulin Glargine (LANTUS SOLOSTAR) 100 UNIT/ML Solostar Pen Inject 27 Units into the skin 2 (two) times daily. 45 mL 0  . Insulin Pen Needle (TRUEPLUS PEN NEEDLES) 32G X 4 MM MISC Use as directed to inject insulin. 100 each 3  . Insulin Pen Needle 31G X 5 MM MISC Use as directed 100 each 12  . isosorbide mononitrate (IMDUR) 60 MG 24 hr tablet Take 1 tablet (60 mg total) by mouth daily. 90 tablet 3  . Multiple Vitamins-Minerals (MULTIVITAMIN WITH MINERALS) tablet Take 1 tablet by mouth daily.     Current Facility-Administered Medications  Medication Dose Route Frequency Provider Last Rate Last Dose  . INSTA-GLUCOSE 77.4 % GEL 1 Tube  1 Tube Oral Once Ladell Pier, MD        Allergies:   Codone [hydrocodone]; Norvasc [amlodipine besylate]; and Sulfa antibiotics    Social History:  The patient  reports that she has never smoked. She has never used smokeless tobacco. She reports that she does not drink alcohol or use drugs.   Family History:  The patient's family history includes Diabetes in her brother; Heart Problems in her maternal grandmother; Heart disease in her maternal grandfather; Kidney disease in her brother; Stomach cancer in her brother.    ROS: All other systems are reviewed and negative. Unless otherwise mentioned in H&P    PHYSICAL EXAM: VS:  BP (!) 169/84   Pulse 84   Ht 5\' 5"  (1.651 m)   Wt 178 lb 9.6 oz (81 kg)   BMI 29.72 kg/m  , BMI Body mass index is 29.72 kg/m. GEN: Well nourished, well developed, in no acute distress  HEENT: normal  Neck: no JVD, carotid bruits, or masses Cardiac: RRR tachycardic no murmurs, rubs, or gallops,no edema  Respiratory:  clear to  auscultation bilaterally, normal work of breathing GI: soft, nontender, nondistended, + BS MS: no deformity or atrophy  Skin: warm and dry, no rash Neuro:  Strength and sensation are intact Psych: euthymic mood, full affect   EKG:  NSR rate of  84 bpm. Non-specific T-wave abnormality. QT 435 ms   Recent Labs: 03/16/2018: Platelets 171 04/12/2018: BUN 93; Creatinine, Ser 6.66; Potassium 4.3; Sodium 137 05/10/2018: Hemoglobin 9.2    Lipid Panel    Component Value Date/Time   CHOL 144 12/01/2016 0616   TRIG 129 12/01/2016 0616   HDL 36 (  L) 12/01/2016 0616   CHOLHDL 4.0 12/01/2016 0616   VLDL 26 12/01/2016 0616   LDLCALC 82 12/01/2016 0616      Wt Readings from Last 3 Encounters:  05/23/18 178 lb 9.6 oz (81 kg)  05/10/18 180 lb (81.6 kg)  03/22/18 180 lb (81.6 kg)      Other studies Reviewed: Echocardiogram 12-29-16 Left ventricle: The cavity size was moderately dilated. Wall   thickness was increased in a pattern of moderate LVH. Systolic   function was severely reduced. The estimated ejection fraction   was in the range of 25% to 30%. Diffuse hypokinesis. - Mitral valve: There was mild to moderate regurgitation. - Atrial septum: No defect or patent foramen ovale was identified.  ASSESSMENT AND PLAN:  1. Weakness and dizziness: This occurs especially after taking isosorbide dose. She did not take it today and is feeling some better without it. Dizziness is resolved. I will decrease isosorbide to 15 mg daily to aid in renal perfusion but hopefully decrease dizziness. I will repeat BMET, CBC, to evaluate kidney function and check for anemia with her symptoms.   2. Hypertension: She is nervous today and has a lot of stress in the family. I rechecked her BP manually 156/78. Still elevated but better than when she first arrived. I will have her daughter, who is a CNA check her BP BID. We are limited on medications now with renal insufficiency and reduced EF. Can consider low dose  amlodipine if remains elevated on follow up.   3. CKD Stage III: Followed by nephrology. Recommended for peritoneal dialysis, but she has refused this.   4. HFrEF: Repeating echo, last calculated LVEF 255-30%. She remains on coreg. She is not a candidate for digoxin, or Entresto. Consider Corlanor for HR control if this is necessary vs addition of clonidine for both HR and BP control.   5. Diabetes: Followed by PCP.Referral number for [facility] to find new PCP is provided.    Current medicines are reviewed at length with the patient today.    Labs/ tests ordered today include: BMET, Echo and CBC.   Phill Myron. [place] Pugh, ANP, AACC   05/23/2018 3:34 PM    [facility] [address], [place], [place] [address] Phone: [phone]; Fax: [phone]

## 2018-05-23 ENCOUNTER — Encounter: Payer: Self-pay | Admitting: Adult Health

## 2018-05-23 ENCOUNTER — Ambulatory Visit: Payer: Medicaid Other | Admitting: Adult Health

## 2018-05-23 VITALS — BP 169/84 | HR 84 | Ht 65.0 in | Wt 178.6 lb

## 2018-05-23 DIAGNOSIS — N184 Chronic kidney disease, stage 4 (severe): Secondary | ICD-10-CM

## 2018-05-23 DIAGNOSIS — Z79899 Other long term (current) drug therapy: Secondary | ICD-10-CM | POA: Diagnosis not present

## 2018-05-23 DIAGNOSIS — I519 Heart disease, unspecified: Secondary | ICD-10-CM

## 2018-05-23 DIAGNOSIS — D631 Anemia in chronic kidney disease: Secondary | ICD-10-CM | POA: Diagnosis not present

## 2018-05-23 DIAGNOSIS — I43 Cardiomyopathy in diseases classified elsewhere: Secondary | ICD-10-CM

## 2018-05-23 DIAGNOSIS — I10 Essential (primary) hypertension: Secondary | ICD-10-CM | POA: Diagnosis not present

## 2018-05-23 DIAGNOSIS — N183 Chronic kidney disease, stage 3 unspecified: Secondary | ICD-10-CM

## 2018-05-23 MED ORDER — ISOSORBIDE MONONITRATE ER 30 MG PO TB24: 15.0000 mg | ORAL_TABLET | Freq: Every day | ORAL | 1 refills | Status: DC

## 2018-05-23 MED FILL — ISOSORBIDE MN ER 30 MG TAB: 30 | 30 days supply | Qty: 15 | Fill #0

## 2018-05-23 NOTE — Progress Notes (Signed)
She will have to go on HD soon, I am convinced. After that we can cath her. Until then, she will remain challenging to manage. MCr

## 2018-05-23 NOTE — Patient Instructions (Signed)
Medication Instructions:  DECREASE ISOSORBIDE 15MG  DAILY  If you need a refill on your cardiac medications before your next appointment, please call your pharmacy.  Labwork: BMET,MAG AND CBC TODAY HERE IN OUR OFFICE AT [facility]  Take the provided lab slips with you to the lab for your blood draw.   Testing/Procedures: Echocardiogram - Your physician has requested that you have an echocardiogram. Echocardiography is a painless test that uses sound waves to create images of your heart. It provides your doctor with information about the size and shape of your heart and how well your heart's chambers and valves are working. This procedure takes approximately one hour. There are no restrictions for this procedure. This will be performed at our [facility] location - [address].  Special Instructions: Please call [facility] network for Physician referral at: [phone] -or- [email or web address]  TAKE AND LOG BP TWICE DAILY  Follow-Up: Your physician wants you to follow-up in: [place]    Thank you for choosing [facility] at [facility]!!

## 2018-05-24 LAB — CBC
HEMOGLOBIN: 9.7 g/dL — AB (ref 11.1–15.9)
Hematocrit: 31.9 % — ABNORMAL LOW (ref 34.0–46.6)
MCH: 28 pg (ref 26.6–33.0)
MCHC: 30.4 g/dL — ABNORMAL LOW (ref 31.5–35.7)
MCV: 92 fL (ref 79–97)
Platelets: 235 10*3/uL (ref 150–450)
RBC: 3.46 x10E6/uL — AB (ref 3.77–5.28)
RDW: 18.5 % — ABNORMAL HIGH (ref 12.3–15.4)
WBC: 6 10*3/uL (ref 3.4–10.8)

## 2018-05-24 LAB — BASIC METABOLIC PANEL
BUN / CREAT RATIO: 13 (ref 9–23)
BUN: 73 mg/dL — AB (ref 6–24)
CALCIUM: 7.6 mg/dL — AB (ref 8.7–10.2)
CHLORIDE: 103 mmol/L (ref 96–106)
CO2: 20 mmol/L (ref 20–29)
CREATININE: 5.8 mg/dL — AB (ref 0.57–1.00)
GFR calc Af Amer: 9 mL/min/{1.73_m2} — ABNORMAL LOW (ref >59)
GFR calc non Af Amer: 7 mL/min/{1.73_m2} — ABNORMAL LOW (ref >59)
GLUCOSE: 209 mg/dL — AB (ref 65–99)
Potassium: 4.1 mmol/L (ref 3.5–5.2)
Sodium: 143 mmol/L (ref 134–144)

## 2018-05-24 LAB — MAGNESIUM: MAGNESIUM: 2.1 mg/dL (ref 1.6–2.3)

## 2018-05-27 ENCOUNTER — Other Ambulatory Visit: Payer: Self-pay

## 2018-05-27 ENCOUNTER — Ambulatory Visit (HOSPITAL_COMMUNITY): Payer: Medicaid Other | Attending: Adult Health

## 2018-05-27 DIAGNOSIS — I519 Heart disease, unspecified: Secondary | ICD-10-CM

## 2018-05-27 DIAGNOSIS — I509 Heart failure, unspecified: Secondary | ICD-10-CM | POA: Insufficient documentation

## 2018-05-27 DIAGNOSIS — I429 Cardiomyopathy, unspecified: Secondary | ICD-10-CM | POA: Diagnosis not present

## 2018-05-27 DIAGNOSIS — I340 Nonrheumatic mitral (valve) insufficiency: Secondary | ICD-10-CM | POA: Diagnosis not present

## 2018-05-27 DIAGNOSIS — E1122 Type 2 diabetes mellitus with diabetic chronic kidney disease: Secondary | ICD-10-CM | POA: Insufficient documentation

## 2018-05-27 DIAGNOSIS — E785 Hyperlipidemia, unspecified: Secondary | ICD-10-CM | POA: Insufficient documentation

## 2018-05-27 DIAGNOSIS — D631 Anemia in chronic kidney disease: Secondary | ICD-10-CM

## 2018-05-27 DIAGNOSIS — N184 Chronic kidney disease, stage 4 (severe): Secondary | ICD-10-CM | POA: Insufficient documentation

## 2018-05-27 DIAGNOSIS — I130 Hypertensive heart and chronic kidney disease with heart failure and stage 1 through stage 4 chronic kidney disease, or unspecified chronic kidney disease: Secondary | ICD-10-CM | POA: Diagnosis not present

## 2018-05-30 ENCOUNTER — Other Ambulatory Visit: Payer: Self-pay | Admitting: Internal Medicine

## 2018-05-30 DIAGNOSIS — E785 Hyperlipidemia, unspecified: Secondary | ICD-10-CM

## 2018-05-30 DIAGNOSIS — E1165 Type 2 diabetes mellitus with hyperglycemia: Secondary | ICD-10-CM

## 2018-05-30 DIAGNOSIS — Z794 Long term (current) use of insulin: Principal | ICD-10-CM

## 2018-05-30 MED FILL — ESCITALOPRAM 20 MG TABLET: 20 | 30 days supply | Qty: 30 | Fill #1

## 2018-05-30 MED FILL — LANTUS SOLOSTAR 100 UNITS/M: 100 | 27 days supply | Qty: 15 | Fill #2

## 2018-06-02 ENCOUNTER — Emergency Department (HOSPITAL_COMMUNITY)
Admission: EM | Admit: 2018-06-02 | Discharge: 2018-06-02 | Disposition: A | Discharge status: Home / Self Care | Payer: Medicaid Other | Attending: Emergency Medicine | Admitting: Emergency Medicine

## 2018-06-02 ENCOUNTER — Other Ambulatory Visit: Payer: Self-pay

## 2018-06-02 ENCOUNTER — Encounter (HOSPITAL_COMMUNITY): Payer: Self-pay | Admitting: Emergency Medicine

## 2018-06-02 DIAGNOSIS — Z7982 Long term (current) use of aspirin: Secondary | ICD-10-CM | POA: Insufficient documentation

## 2018-06-02 DIAGNOSIS — Z794 Long term (current) use of insulin: Secondary | ICD-10-CM | POA: Insufficient documentation

## 2018-06-02 DIAGNOSIS — I132 Hypertensive heart and chronic kidney disease with heart failure and with stage 5 chronic kidney disease, or end stage renal disease: Secondary | ICD-10-CM | POA: Diagnosis not present

## 2018-06-02 DIAGNOSIS — E1122 Type 2 diabetes mellitus with diabetic chronic kidney disease: Secondary | ICD-10-CM | POA: Diagnosis not present

## 2018-06-02 DIAGNOSIS — Z79899 Other long term (current) drug therapy: Secondary | ICD-10-CM | POA: Diagnosis not present

## 2018-06-02 DIAGNOSIS — R42 Dizziness and giddiness: Secondary | ICD-10-CM | POA: Diagnosis present

## 2018-06-02 DIAGNOSIS — I5022 Chronic systolic (congestive) heart failure: Secondary | ICD-10-CM | POA: Diagnosis not present

## 2018-06-02 DIAGNOSIS — I951 Orthostatic hypotension: Secondary | ICD-10-CM | POA: Diagnosis not present

## 2018-06-02 DIAGNOSIS — N186 End stage renal disease: Secondary | ICD-10-CM

## 2018-06-02 LAB — I-STAT BETA HCG BLOOD, ED (MC, WL, AP ONLY): I-stat hCG, quantitative: <5 m[IU]/mL (ref <5)

## 2018-06-02 LAB — URINALYSIS, ROUTINE W REFLEX MICROSCOPIC
BILIRUBIN URINE: NEGATIVE
GLUCOSE, UA: 50 mg/dL — AB
HGB URINE DIPSTICK: NEGATIVE
KETONES UR: NEGATIVE mg/dL
LEUKOCYTES UA: NEGATIVE
NITRITE: NEGATIVE
PH: 5 (ref 5.0–8.0)
Specific Gravity, Urine: 1.012 (ref 1.005–1.030)

## 2018-06-02 LAB — CBC
HEMATOCRIT: 31.8 % — AB (ref 36.0–46.0)
HEMOGLOBIN: 10.2 g/dL — AB (ref 12.0–15.0)
MCH: 29.5 pg (ref 26.0–34.0)
MCHC: 32.1 g/dL (ref 30.0–36.0)
MCV: 91.9 fL (ref 78.0–100.0)
Platelets: 187 10*3/uL (ref 150–400)
RBC: 3.46 MIL/uL — AB (ref 3.87–5.11)
RDW: 15.9 % — ABNORMAL HIGH (ref 11.5–15.5)
WBC: 7.2 10*3/uL (ref 4.0–10.5)

## 2018-06-02 LAB — BASIC METABOLIC PANEL
ANION GAP: 14 (ref 5–15)
BUN: 97 mg/dL — ABNORMAL HIGH (ref 6–20)
CO2: 19 mmol/L — ABNORMAL LOW (ref 22–32)
Calcium: 8.2 mg/dL — ABNORMAL LOW (ref 8.9–10.3)
Chloride: 104 mmol/L (ref 98–111)
Creatinine, Ser: 6.27 mg/dL — ABNORMAL HIGH (ref 0.44–1.00)
GFR, EST AFRICAN AMERICAN: 8 mL/min — AB (ref >60)
GFR, EST NON AFRICAN AMERICAN: 7 mL/min — AB (ref >60)
Glucose, Bld: 235 mg/dL — ABNORMAL HIGH (ref 70–99)
POTASSIUM: 4.4 mmol/L (ref 3.5–5.1)
Sodium: 137 mmol/L (ref 135–145)

## 2018-06-02 LAB — MAGNESIUM: Magnesium: 2.1 mg/dL (ref 1.7–2.4)

## 2018-06-02 MED ORDER — SODIUM CHLORIDE 0.9 % IV BOLUS
250.0000 mL | Freq: Once | INTRAVENOUS | Status: AC
  Administered 2018-06-02: 250 mL via INTRAVENOUS

## 2018-06-02 NOTE — ED Triage Notes (Addendum)
Was seen at dr office today kidney dr and she had low bp she was seen and  They laid her down and then did them agagin when she stood  And it dropped to 56 /50 is being sent for further eval states took her bp meds this am,  She states she is dizzy when she stands

## 2018-06-02 NOTE — Discharge Instructions (Addendum)
Please see the information and instructions below regarding your visit.  Your diagnoses today include:  1. Dizziness   2. Orthostasis   3. ESRD (end stage renal disease) ([place])     Tests performed today include: See side panel of your discharge paperwork for testing performed today. Vital signs are listed at the bottom of these instructions.   Your blood pressure today lying, sitting, and standing demonstrated that her blood pressure slightly dropped.  We replaced some your fluid volume today.   Your EKG did show that 1 of your heart intervals, called the QT interval was slightly prolonged.  This is been consistent over time is steadily prolonging.  Cardiology would like to take you off of Lexapro and switching to something else, but would refer this to your primary care provider.  I will send Dr. Wynetta Emery a note.  Medications prescribed:    Take any prescribed medications only as prescribed, and any over the counter medications only as directed on the packaging.  Resume home medications.  Home care instructions:  Please follow any educational materials contained in this packet.   Follow-up instructions: Please follow-up with your primary care provider in one week for further evaluation of your symptoms if they are not completely improved.   Please follow up with Cardiology, Nephrology.  Return instructions:  Please return to the Emergency Department if you experience worsening symptoms.  Return to the emergency department immediately for any feeling that you are going to pass out, chest pain, shortness of breath, headaches, vision changes or weakness. Please return if you have any other emergent concerns.  Additional Information:   Your vital signs today were: BP (!) 181/87    Pulse 87    Temp 97.7 F (36.5 C) (Oral)    Resp (!) 22    SpO2 100%  If your blood pressure (BP) was elevated on multiple readings during this visit above 130 for the top number or above 80 for the  bottom number, please have this repeated by your primary care provider within one month. --------------  Thank you for allowing [place] to participate in your care today.

## 2018-06-02 NOTE — ED Provider Notes (Signed)
[facility] EMERGENCY DEPARTMENT Provider Note   CSN: [id number] Arrival date & time: 06/02/18  1125     History   Chief Complaint Chief Complaint  Patient presents with  . Hypotension  . Dizziness    HPI Patricia [name] is a 62 y.o. female.  HPI  Patient is a 62 year old female with a history of CHF, type of diabetes mellitus, hypertension presenting for dizziness and hypertension.  Per patient, she was sent by her nephrologist, Dr. Joelyn Oms after feeling dizzy upon standing when she was at his office today, and receiving a blood pressure reading of 74/50.  She was referred to the emergency department for evaluation.  Patient reports that she will call her blood pressure medications morning including Coreg, hydralazine, and half an Imdur pill, and only had a cup of applesauce for breakfast.  Blood sugar and her primary care office was 231.  Patient denies any chest pain or palpitations at the time of dizziness.  Patient did report that she felt like she could pass out.  Patient reports she has back to her baseline at present, and denies any chest pain or shortness of breath, dizziness, lightheadedness, vertigo, weakness or numbness in any part of the body.  Patient denies any swelling.  Patient notes that she has had discussions about peritoneal dialysis with her nephrologist for couple months now.  She is scheduled for monthly follow-up for recheck of her creatinine and BUN.  Past Medical History:  Diagnosis Date  . Allergy   . Anemia   . Anxiety   . Blood transfusion without reported diagnosis   . Cardiomyopathy ([place]) 11/2016   no ischemic eval due to CKD  . CHF (congestive heart failure) ([place])   . CKD (chronic kidney disease), stage IV ([place])   . Depression   . Diabetes mellitus without complication ([place])    type 2  . Dyspnea   . Hypertension   . Obesity     Patient Active Problem List   Diagnosis Date Noted  . Systolic CHF ([place]) 74/25/9563  . Diabetes  mellitus type 2 with complications ([place]) 87/56/4332  . Transaminasemia 11/29/2016  . Cardiomyopathy ([place]) 11/12/2016  . Lipoma 10/21/2016  . CKD (chronic kidney disease) stage 4, GFR 15-29 ml/min (HCC) 08/10/2016  . Anxiety and depression 12/12/2015  . Diabetic nephropathy associated with type 2 diabetes mellitus ([place]) 11/15/2014  . Moderate nonproliferative diabetic retinopathy(362.05) 04/17/2014  . 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 Essential hypertension 03/28/2014  . Dental caries 03/28/2014  . Dyslipidemia 04/18/2013    Past Surgical History:  Procedure Laterality Date  . ABDOMINAL HYSTERECTOMY    . BREAST SURGERY     reduction  . [place]  . LIPOMA EXCISION     back  Dr. Excell Seltzer 03-22-18  . LIPOMA EXCISION N/A 03/22/2018   Procedure: EXCISION OF BACK LIPOMA;  Surgeon: Excell Seltzer, MD;  Location: [facility];  Service: General;  Laterality: N/A;  . REDUCTION MAMMAPLASTY Bilateral      OB History    Gravida  2   Para  2   Term  2   Preterm      AB      Living  2     SAB      TAB      Ectopic      Multiple      Live Births               Home Medications  Prior to Admission medications   Medication Sig Start Date End Date Taking? Authorizing Provider  aspirin EC 81 MG tablet Take 1 tablet (81 mg total) by mouth daily. 01/04/17   Maren Reamer, MD  atorvastatin (LIPITOR) 20 MG tablet Take 0.5 tablets (10 mg total) by mouth daily. MUST MAKE APPT FOR FURTHER REFILLS 04/26/18   Ladell Pier, MD  Bee Pollen 550 MG CAPS Take 1 capsule by mouth daily.    [provider]  bismuth subsalicylate (PEPTO BISMOL) 262 MG/15ML suspension Take 30 mLs by mouth every 6 (six) hours as needed for indigestion (as needed).     [provider]  calcitRIOL (ROCALTROL) 0.25 MCG capsule Take 1 capsule by mouth daily. 02/15/18   [provider]  calcium carbonate (TUMS - DOSED IN MG ELEMENTAL CALCIUM) 500 MG  chewable tablet Chew 1 tablet by mouth 3 (three) times daily with meals.    [provider]  carvedilol (COREG) 25 MG tablet TAKE 1 TABLET BY MOUTH TWICE DAILY WITH A MEAL 04/21/18   Strader, [place] M, PA-C  escitalopram (LEXAPRO) 20 MG tablet TAKE 1 TABLET BY MOUTH AT BEDTIME. 03/30/18   Ladell Pier, MD  furosemide (LASIX) 40 MG tablet TAKE 1 TABLET BY MOUTH 2 TIMES DAILY. 11/19/17   Ladell Pier, MD  gabapentin (NEURONTIN) 100 MG capsule Take 1 capsule (100 mg total) by mouth at bedtime. 11/19/17   Ladell Pier, MD  Garlic 161 MG TABS Take 1 tablet by mouth daily.    [provider]  glipiZIDE (GLUCOTROL) 10 MG tablet TAKE 1 TABLET BY MOUTH 2 TIMES DAILY BEFORE A MEAL. MUST MAKE APPT FOR FURTHER REFILLS 04/26/18   Ladell Pier, MD  hydrALAZINE (APRESOLINE) 100 MG tablet Take 1 tablet (100 mg total) by mouth 2 (two) times daily. 01/25/18   Ladell Pier, MD  insulin aspart (NOVOLOG) 100 UNIT/ML injection 3 units before meals. Patient taking differently: Inject 5 Units into the skin 3 (three) times daily as needed for high blood sugar. 5 units before meals. 04/06/17   Ladell Pier, MD  Insulin Glargine (LANTUS SOLOSTAR) 100 UNIT/ML Solostar Pen Inject 27 Units into the skin 2 (two) times daily. 01/27/18   Ladell Pier, MD  Insulin Pen Needle (TRUEPLUS PEN NEEDLES) 32G X 4 MM MISC Use as directed to inject insulin. 03/25/18   Ladell Pier, MD  Insulin Pen Needle 31G X 5 MM MISC Use as directed 03/28/18   Ladell Pier, MD  isosorbide mononitrate (IMDUR) 30 MG 24 hr tablet Take 0.5 tablets (15 mg total) by mouth daily. 05/23/18   Lendon Colonel, NP  Multiple Vitamins-Minerals (MULTIVITAMIN WITH MINERALS) tablet Take 1 tablet by mouth daily.    [provider]    Family History Family History  Problem Relation Age of Onset  . Diabetes Brother   . Stomach cancer Brother   . Kidney disease Brother   . Heart Problems Maternal  Grandmother        pacemaker  . Heart disease Maternal Grandfather   . Rectal cancer Neg Hx   . Esophageal cancer Neg Hx   . Liver cancer Neg Hx   . Colon cancer Neg Hx     Social History Social History   Tobacco Use  . Smoking status: Never Smoker  . Smokeless tobacco: Never Used  Substance Use Topics  . Alcohol use: No  . Drug use: No     Allergies  Codone [hydrocodone]; Norvasc [amlodipine besylate]; and Sulfa antibiotics   Review of Systems Review of Systems  Constitutional: Negative for chills and fever.  HENT: Negative for congestion and sore throat.   Eyes: Negative for visual disturbance.  Respiratory: Negative for cough, chest tightness and shortness of breath.   Cardiovascular: Negative for chest pain, palpitations and leg swelling.       Chronic LE swelling  Gastrointestinal: Negative for abdominal pain, diarrhea, nausea and vomiting.  Genitourinary: Negative for dysuria and flank pain.  Musculoskeletal: Negative for back pain and myalgias.  Skin: Negative for rash.  Neurological: Positive for dizziness and light-headedness. Negative for syncope, weakness and headaches.     Physical Exam Updated Vital Signs BP (!) 147/79   Pulse 75   Temp 97.7 F (36.5 C) (Oral)   Resp 16   SpO2 100%   Physical Exam  Constitutional: She appears well-developed and well-nourished. No distress.  HENT:  Head: Normocephalic and atraumatic.  Mouth/Throat: Oropharynx is clear and moist.  Eyes: Pupils are equal, round, and reactive to light. Conjunctivae and EOM are normal.  Neck: Normal range of motion. Neck supple.  Cardiovascular: Normal rate, regular rhythm, S1 normal and S2 normal.  No murmur heard. Pulses:      Radial pulses are 2+ on the right side, and 2+ on the left side.       Dorsalis pedis pulses are 2+ on the right side, and 2+ on the left side.  Pulmonary/Chest: Effort normal and breath sounds normal. She has no wheezes. She has no rales.  Abdominal:  Soft. She exhibits no distension. There is no tenderness. There is no guarding.  Musculoskeletal: Normal range of motion. She exhibits no edema or deformity.  Neurological: She is alert.  Cranial nerves grossly intact. Patient moves extremities symmetrically and with good coordination.  Skin: Skin is warm and dry. No rash noted. No erythema.  Psychiatric: She has a normal mood and affect. Her behavior is normal. Judgment and thought content normal.  Nursing note and vitals reviewed.    ED Treatments / Results  Labs (all labs ordered are listed, but only abnormal results are displayed) Labs Reviewed  BASIC METABOLIC PANEL - Abnormal; Notable for the following components:      Result Value   CO2 19 (*)    Glucose, Bld 235 (*)    BUN 97 (*)    Creatinine, Ser 6.27 (*)    Calcium 8.2 (*)    GFR calc non Af Amer 7 (*)    GFR calc Af Amer 8 (*)    All other components within normal limits  CBC - Abnormal; Notable for the following components:   RBC 3.46 (*)    Hemoglobin 10.2 (*)    HCT 31.8 (*)    RDW 15.9 (*)    All other components within normal limits  URINALYSIS, ROUTINE W REFLEX MICROSCOPIC  CBG MONITORING, ED  I-STAT BETA HCG BLOOD, ED ([facility], [facility], [facility] ONLY)    EKG EKG Interpretation  Date/Time:  Thursday June 02 2018 12:00:39 EDT Ventricular Rate:  74 PR Interval:  148 QRS Duration: 80 QT Interval:  492 QTC Calculation: 546 R Axis:   23 Text Interpretation:  Normal sinus rhythm Cannot rule out Anterior infarct , age undetermined Prolonged QT Abnormal ECG QT longer compared to previous Confirmed by Kirk Ruths [phone]) on 06/02/2018 3:22:34 PM   Radiology No results found.  Procedures Procedures (including critical care time)  Medications Ordered in ED Medications - No  data to display   Initial Impression / Assessment and Plan / ED Course  I have reviewed the triage vital signs and the nursing notes.  Pertinent labs & imaging results that were available  during my care of the patient were reviewed by me and considered in my medical decision making (see chart for details).  Clinical Course as of Jun 02 2149  Thu Jun 02, 2018  1920 Spoke with Suanne Marker, cardiology PA who states that story does not sound like cardiogenic syncope. Favoring orthostatic.  Regarding patient's prolonged QT, PA to discuss with Dr. Harl Bowie, cardiology MD.  We will plan to route patient's chart to her primary care provider to discuss further medication management and possible reduction in Lexapro.   [AM]  2022 Spoke with Dr. Marval Regal who states that patient can be followed as an outpatient by Dr. Joelyn Oms. Appears to have orthostatic syncope w/ no evidence of uremia/volume overload or third spacing. Appreciate Dr. Elissa Hefty involvement in the care of this patient.    [AM]    Clinical Course User Index [AM] Albesa Seen, PA-C    Patient is nontoxic-appearing and in no acute distress on my evaluation. DDx includes: Orthostatic hypotension Stroke Vertebral artery dissection/stenosis Dysrhythmia PE Vasovagal/neurocardiogenic syncope Aortic stenosis Valvular disorder/Cardiomyopathy Anemia  BP (!) 147/79   Pulse 75   Temp 97.7 F (36.5 C) (Oral)   Resp 16   SpO2 100%   Lab Results  Component Value Date   CREATININE 6.27 (H) 06/02/2018   CREATININE 5.80 (H) 05/23/2018   CREATININE 6.66 (H) 04/12/2018   Patient does have evidence of orthostatic hypotension here in the emergency department.  Patient also has prolonged QT, which is been progressively prolonging for the past several months.  Potassium is normal today.  Patient is on Lexapro, but no other clearly QT prolonging medications.  Will discuss with cardiology nephrology prior to disposition.  Creatinine is steadily worsening, but patient has stable creatinine over the past 1 to 2 months.  Patient's nephrologist aware, and patient has close follow up.  Per discussion with both cardiology and nephrology,  patient is candidate for outpatient reevaluation and management.  On final reassessment, patient completely back to her neurologic baseline after 2050 mL of normal saline bolus.  Patient amenable to close follow-up.  Patient requesting discharge.  Patient can return precautions for any further episodes of dizziness or sensations of presyncope, chest pain, or shortness of breath.  Patient and her daughter are in understanding and agree with the plan of care.  This is a supervised visit with Dr. Dene Gentry. Evaluation, management, and discharge planning discussed with this attending physician.  Final Clinical Impressions(s) / ED Diagnoses   Final diagnoses:  Dizziness  Orthostasis  ESRD (end stage renal disease) [facility])    ED Discharge Orders    None       Tamala Julian 06/02/18 2154    Valarie Merino, MD 06/03/18 [phone]

## 2018-06-03 ENCOUNTER — Other Ambulatory Visit: Payer: Self-pay

## 2018-06-03 ENCOUNTER — Telehealth: Payer: Self-pay | Admitting: Internal Medicine

## 2018-06-03 DIAGNOSIS — E785 Hyperlipidemia, unspecified: Secondary | ICD-10-CM

## 2018-06-03 DIAGNOSIS — Z794 Long term (current) use of insulin: Secondary | ICD-10-CM

## 2018-06-03 DIAGNOSIS — E1165 Type 2 diabetes mellitus with hyperglycemia: Secondary | ICD-10-CM

## 2018-06-03 MED ORDER — GLIPIZIDE 10 MG PO TABS: ORAL_TABLET | ORAL | 0 refills | Status: DC

## 2018-06-03 MED ORDER — ATORVASTATIN CALCIUM 20 MG PO TABS: 10.0000 mg | ORAL_TABLET | Freq: Every day | ORAL | 0 refills | Status: DC

## 2018-06-03 NOTE — Telephone Encounter (Signed)
-----   Message from Albesa Seen, [place] sent at 06/03/2018  2:17 AM EDT ----- Regarding: ED Patient Follow Up Hello Dr. Wynetta Emery,  I evaluated the above patient with Dr. Dene Gentry on 06-02-2018.  Patient presenting for near syncopal episode while at her nephrologist office.  Patient found to be orthostatic, and this was addressed, however in consultation with cardiology regarding progressively prolonged QT during 2019, Dr. Sallyanne Kuster of cardiology did provide the recommendation that patient come off of Lexapro and be changed to a less QT prolonging SSRI.  I discussed with cardiology that I would pass along this recommendation.  Patient was made aware that we recommend she make an appointment with you.  Thank you for caring for mutual patients.  Alyssa B. Valere Dross, PA-C

## 2018-06-03 NOTE — Telephone Encounter (Signed)
Contacted pt and made aware of Dr. Wynetta Emery response. Pt is schedule to see Levada Dy on June 08, 2018 @1050am   Dr. Wynetta Emery you didn't any availability till sept 16

## 2018-06-07 ENCOUNTER — Ambulatory Visit (HOSPITAL_COMMUNITY)
Admission: RE | Admit: 2018-06-07 | Discharge: 2018-06-07 | Disposition: A | Discharge status: Home / Self Care | Payer: Medicaid Other | Source: Ambulatory Visit | Attending: Nephrology | Admitting: Nephrology

## 2018-06-07 VITALS — BP 118/56 | HR 79 | Resp 18

## 2018-06-07 DIAGNOSIS — D631 Anemia in chronic kidney disease: Secondary | ICD-10-CM | POA: Insufficient documentation

## 2018-06-07 DIAGNOSIS — N184 Chronic kidney disease, stage 4 (severe): Secondary | ICD-10-CM | POA: Diagnosis present

## 2018-06-07 LAB — RENAL FUNCTION PANEL
ALBUMIN: 2.6 g/dL — AB (ref 3.5–5.0)
Anion gap: 13 (ref 5–15)
BUN: 94 mg/dL — AB (ref 6–20)
CO2: 18 mmol/L — ABNORMAL LOW (ref 22–32)
CREATININE: 6.17 mg/dL — AB (ref 0.44–1.00)
Calcium: 8 mg/dL — ABNORMAL LOW (ref 8.9–10.3)
Chloride: 105 mmol/L (ref 98–111)
GFR calc non Af Amer: 7 mL/min — ABNORMAL LOW (ref >60)
GFR, EST AFRICAN AMERICAN: 8 mL/min — AB (ref >60)
Glucose, Bld: 239 mg/dL — ABNORMAL HIGH (ref 70–99)
POTASSIUM: 4.2 mmol/L (ref 3.5–5.1)
Phosphorus: 8.3 mg/dL — ABNORMAL HIGH (ref 2.5–4.6)
Sodium: 136 mmol/L (ref 135–145)

## 2018-06-07 LAB — IRON AND TIBC
Iron: 78 ug/dL (ref 28–170)
SATURATION RATIOS: 33 % — AB (ref 10.4–31.8)
TIBC: 237 ug/dL — ABNORMAL LOW (ref 250–450)
UIBC: 159 ug/dL

## 2018-06-07 LAB — FERRITIN: FERRITIN: 368 ng/mL — AB (ref 11–307)

## 2018-06-07 LAB — POCT HEMOGLOBIN-HEMACUE: HEMOGLOBIN: 9.2 g/dL — AB (ref 12.0–15.0)

## 2018-06-07 MED ORDER — DARBEPOETIN ALFA 200 MCG/0.4ML IJ SOSY
200.0000 ug | PREFILLED_SYRINGE | INTRAMUSCULAR | Status: DC
  Administered 2018-06-07: 200 ug via SUBCUTANEOUS

## 2018-06-07 MED ORDER — DARBEPOETIN ALFA 200 MCG/0.4ML IJ SOSY
PREFILLED_SYRINGE | INTRAMUSCULAR | Status: AC
  Filled 2018-06-07: qty 0.4

## 2018-06-08 ENCOUNTER — Ambulatory Visit: Payer: Medicaid Other | Attending: Family Medicine | Admitting: Physician Assistant

## 2018-06-08 VITALS — BP 107/61 | HR 81 | Temp 98.6°F | Resp 18 | Ht 64.0 in | Wt 178.0 lb

## 2018-06-08 DIAGNOSIS — Z79899 Other long term (current) drug therapy: Secondary | ICD-10-CM | POA: Diagnosis not present

## 2018-06-08 DIAGNOSIS — F329 Major depressive disorder, single episode, unspecified: Secondary | ICD-10-CM | POA: Diagnosis not present

## 2018-06-08 DIAGNOSIS — Z09 Encounter for follow-up examination after completed treatment for conditions other than malignant neoplasm: Secondary | ICD-10-CM | POA: Diagnosis not present

## 2018-06-08 DIAGNOSIS — N184 Chronic kidney disease, stage 4 (severe): Secondary | ICD-10-CM | POA: Diagnosis not present

## 2018-06-08 DIAGNOSIS — R9431 Abnormal electrocardiogram [ECG] [EKG]: Secondary | ICD-10-CM

## 2018-06-08 DIAGNOSIS — E669 Obesity, unspecified: Secondary | ICD-10-CM | POA: Diagnosis not present

## 2018-06-08 DIAGNOSIS — I429 Cardiomyopathy, unspecified: Secondary | ICD-10-CM | POA: Diagnosis not present

## 2018-06-08 DIAGNOSIS — I4581 Long QT syndrome: Secondary | ICD-10-CM | POA: Insufficient documentation

## 2018-06-08 DIAGNOSIS — I10 Essential (primary) hypertension: Secondary | ICD-10-CM

## 2018-06-08 DIAGNOSIS — F419 Anxiety disorder, unspecified: Secondary | ICD-10-CM | POA: Diagnosis not present

## 2018-06-08 DIAGNOSIS — Z7982 Long term (current) use of aspirin: Secondary | ICD-10-CM | POA: Insufficient documentation

## 2018-06-08 DIAGNOSIS — Z794 Long term (current) use of insulin: Secondary | ICD-10-CM | POA: Diagnosis not present

## 2018-06-08 DIAGNOSIS — I959 Hypotension, unspecified: Secondary | ICD-10-CM

## 2018-06-08 DIAGNOSIS — E1122 Type 2 diabetes mellitus with diabetic chronic kidney disease: Secondary | ICD-10-CM | POA: Diagnosis not present

## 2018-06-08 DIAGNOSIS — E1165 Type 2 diabetes mellitus with hyperglycemia: Secondary | ICD-10-CM | POA: Diagnosis not present

## 2018-06-08 DIAGNOSIS — I509 Heart failure, unspecified: Secondary | ICD-10-CM | POA: Insufficient documentation

## 2018-06-08 DIAGNOSIS — I130 Hypertensive heart and chronic kidney disease with heart failure and stage 1 through stage 4 chronic kidney disease, or unspecified chronic kidney disease: Secondary | ICD-10-CM | POA: Insufficient documentation

## 2018-06-08 LAB — PTH, INTACT AND CALCIUM
Calcium, Total (PTH): 7.8 mg/dL — ABNORMAL LOW (ref 8.7–10.2)
PTH: 332 pg/mL — ABNORMAL HIGH (ref 15–65)

## 2018-06-08 LAB — GLUCOSE, POCT (MANUAL RESULT ENTRY): POC Glucose: 153 mg/dl — AB (ref 70–99)

## 2018-06-08 MED ORDER — HYDRALAZINE HCL 50 MG PO TABS: 50.0000 mg | ORAL_TABLET | Freq: Two times a day (BID) | ORAL | 2 refills | Status: DC

## 2018-06-08 MED FILL — hydrALAZINE HCL 50 MG TABS: 50 | 90 days supply | Qty: 180 | Fill #0

## 2018-06-08 NOTE — Progress Notes (Signed)
Patient ID: CALY PELLUM, female   DOB: 05-06-1956, 62 y.o.   MRN: [id number]      Patricia [name], is a 62 y.o. female  FOY:[id number]  VEH:[id number]  DOB - [birth year]  Subjective:  Chief Complaint and HPI: Patricia [name] is a 62 y.o. female here today for a follow up visit After being seen in ED 06/02/2018 for dizziness and hypotension.  She is feeling overall better today but feels tired.  BP has been running 90s/60s at home.  Compliant with meds.    Sees cardiology 9/03 and Dr Joelyn Oms 07/08/2018.  From ED note: Patient is a 62 year old female with a history of CHF, type of diabetes mellitus, hypertension presenting for dizziness and hypertension.  Per patient, she was sent by her nephrologist, Dr. Joelyn Oms after feeling dizzy upon standing when she was at his office today, and receiving a blood pressure reading of 74/50.  She was referred to the emergency department for evaluation.  Patient reports that she will call her blood pressure medications morning including Coreg, hydralazine, and half an Imdur pill, and only had a cup of applesauce for breakfast.  Blood sugar and her primary care office was 231.  Patient denies any chest pain or palpitations at the time of dizziness.  Patient did report that she felt like she could pass out.  Patient reports she has back to her baseline at present, and denies any chest pain or shortness of breath, dizziness, lightheadedness, vertigo, weakness or numbness in any part of the body.  Patient denies any swelling.  Patient notes that she has had discussions about peritoneal dialysis with her nephrologist for couple months now.  She is scheduled for monthly follow-up for recheck of her creatinine and BUN.  From A/P: Patient does have evidence of orthostatic hypotension here in the emergency department.  Patient also has prolonged QT, which is been progressively prolonging for the past several months.  Potassium is normal today.  Patient is on Lexapro, but no other  clearly QT prolonging medications.  Will discuss with cardiology nephrology prior to disposition.  Creatinine is steadily worsening, but patient has stable creatinine over the past 1 to 2 months.  Patient's nephrologist aware, and patient has close follow up.  Per discussion with both cardiology and nephrology, patient is candidate for outpatient reevaluation and management.  On final reassessment, patient completely back to her neurologic baseline after 2050 mL of normal saline bolus.  Patient amenable to close follow-up.  Patient requesting discharge.  Patient can return precautions for any further episodes of dizziness or sensations of presyncope, chest pain, or shortness of breath.  Patient and her daughter are in understanding and agree with the plan of care.  This is a supervised visit with Dr. Dene Gentry. Evaluation, management, and discharge planning discussed with this attending physician. ED/Hospital notes reviewed.   Social History: Family history:  ROS:   Constitutional:  No f/c, No night sweats, No unexplained weight loss. EENT:  No vision changes, No blurry vision, No hearing changes. No mouth, throat, or ear problems.  Respiratory: No cough, No SOB Cardiac: No CP, no palpitations GI:  No abd pain, No N/V/D. GU: No Urinary s/sx Musculoskeletal: No joint pain Neuro: No headache, no dizziness, no motor weakness.  Skin: No rash Endocrine:  No polydipsia. No polyuria.  Psych: Denies SI/HI  No problems updated.  ALLERGIES: Allergies  Allergen Reactions  . Codone [Hydrocodone] Itching  . Norvasc [Amlodipine Besylate] Swelling    Lower extremity?  . Sulfa  Antibiotics Itching and Rash    PAST MEDICAL HISTORY: Past Medical History:  Diagnosis Date  . Allergy   . Anemia   . Anxiety   . Blood transfusion without reported diagnosis   . Cardiomyopathy ([place]) 11/2016   no ischemic eval due to CKD  . CHF (congestive heart failure) ([place])   . CKD (chronic kidney disease),  stage IV ([place])   . Depression   . Diabetes mellitus without complication ([place])    type 2  . Dyspnea   . Hypertension   . Obesity     MEDICATIONS AT HOME: Prior to Admission medications   Medication Sig Start Date End Date Taking? Authorizing Provider  acetaminophen (TYLENOL) 500 MG tablet Take 500-1,000 mg by mouth every 6 (six) hours as needed (for pain or headaches).   Yes [provider]  aspirin EC 81 MG tablet Take 1 tablet (81 mg total) by mouth daily. 01/04/17  Yes Langeland, Dawn T, MD  atorvastatin (LIPITOR) 20 MG tablet Take 0.5 tablets (10 mg total) by mouth daily. 06/03/18  Yes Ladell Pier, MD  Bee Pollen 550 MG CAPS Take 550 mg by mouth daily as needed (to boost the immune system).    Yes [provider]  bismuth subsalicylate (PEPTO BISMOL) 262 MG/15ML suspension Take 30 mLs by mouth every 6 (six) hours as needed for indigestion.    Yes [provider]  calcitRIOL (ROCALTROL) 0.25 MCG capsule Take 0.25 mcg by mouth daily.  02/15/18  Yes [provider]  calcium carbonate (TUMS - DOSED IN MG ELEMENTAL CALCIUM) 500 MG chewable tablet Chew 1 tablet by mouth 3 (three) times daily as needed for indigestion or heartburn.    Yes [provider]  carvedilol (COREG) 25 MG tablet TAKE 1 TABLET BY MOUTH TWICE DAILY WITH A MEAL Patient taking differently: Take 25 mg by mouth 2 (two) times daily with a meal.  04/21/18  Yes Strader, [place] M, PA-C  escitalopram (LEXAPRO) 20 MG tablet TAKE 1 TABLET BY MOUTH AT BEDTIME. 03/30/18  Yes Ladell Pier, MD  furosemide (LASIX) 40 MG tablet TAKE 1 TABLET BY MOUTH 2 TIMES DAILY. Patient taking differently: Take 80 mg by mouth daily.  11/19/17  Yes Ladell Pier, MD  gabapentin (NEURONTIN) 100 MG capsule Take 1 capsule (100 mg total) by mouth at bedtime. 11/19/17  Yes Ladell Pier, MD  Garlic 710 MG TABS Take 100 mg by mouth daily.    Yes [provider]  glipiZIDE (GLUCOTROL) 10 MG  tablet TAKE 1 TABLET BY MOUTH 2 TIMES DAILY BEFORE A MEAL. 06/03/18  Yes Ladell Pier, MD  glucose 4 GM chewable tablet Chew 1 tablet by mouth as needed for low blood sugar.   Yes [provider]  insulin aspart (NOVOLOG) 100 UNIT/ML injection 3 units before meals. Patient taking differently: Inject 5 Units into the skin 3 (three) times daily as needed (before meals for high blood sugar levels).  04/06/17  Yes Ladell Pier, MD  Insulin Glargine (LANTUS SOLOSTAR) 100 UNIT/ML Solostar Pen Inject 27 Units into the skin 2 (two) times daily. 01/27/18  Yes Ladell Pier, MD  Insulin Pen Needle (TRUEPLUS PEN NEEDLES) 32G X 4 MM MISC Use as directed to inject insulin. 03/25/18  Yes Ladell Pier, MD  Insulin Pen Needle 31G X 5 MM MISC Use as directed 03/28/18  Yes Ladell Pier, MD  isosorbide mononitrate (IMDUR) 30 MG 24 hr tablet Take 0.5 tablets (15  mg total) by mouth daily. 05/23/18  Yes Lendon Colonel, NP  hydrALAZINE (APRESOLINE) 50 MG tablet Take 1 tablet (50 mg total) by mouth 2 (two) times daily. 06/08/18   Argentina Donovan, PA-C     Objective:  EXAM:   Vitals:   06/08/18 1105  BP: 107/61  Pulse: 81  Resp: 18  Temp: 98.6 F (37 C)  TempSrc: Oral  SpO2: 97%  Weight: 178 lb (80.7 kg)  Height: 5\' 4"  (1.626 m)   Initial BP here today 96/56 General appearance : A&OX3. NAD. Non-toxic-appearing HEENT: Atraumatic and Normocephalic.  PERRLA. EOM intact.  Neck: supple, no JVD. No cervical lymphadenopathy. No thyromegaly Chest/Lungs:  Breathing-non-labored, Good air entry bilaterally, breath sounds normal without rales, rhonchi, or wheezing  CVS: S1 S2 regular, no murmurs, gallops, rubs  Extremities: Bilateral Lower Ext shows no edema, both legs are warm to touch with = pulse throughout Neurology:  CN II-XII grossly intact, Non focal.   Psych:  TP linear. J/I WNL. Normal speech. Appropriate eye contact and affect.  Skin:  No Rash  Data Review Lab  Results  Component Value Date   HGBA1C 5.7 12/09/2017   HGBA1C 7.3 09/06/2017   HGBA1C 8.9 06/10/2017     Assessment & Plan   1. Encounter for examination following treatment at hospital improving  2. Type 2 diabetes mellitus with hyperglycemia, with long-term current use of insulin (HCC) suboptimal control-continue current regimen and tighter diet control - Glucose (CBG)  3. Essential hypertension Uncontrolled with hypotension-will continue current regimen except for decreasing hydralazine dose - hydrALAZINE (APRESOLINE) 50 MG tablet; Take 1 tablet (50 mg total) by mouth 2 (two) times daily.  Dispense: 60 tablet; Refill: 2  4. CKD stage 4 due to type 2 diabetes mellitus (HCC) Sees Dr Joelyn Oms 9/27  5. Hypotension, unspecified hypotension type Decrease dose hydralazine  6. Prolonged QT interval Seeing cardiology next week.  Call 911 if any CP or problems.       Patient have been counseled extensively about nutrition and exercise  Return in about 1 month (around 07/09/2018) for with PCP regarding Bp and DM.  The patient was given clear instructions to go to ER or return to medical center if symptoms don't improve, worsen or new problems develop. The patient verbalized understanding. The patient was told to call to get lab results if they haven't heard anything in the next week.     Freeman Caldron, PA-C [facility] [place], [place]   06/08/2018, 11:15 AM

## 2018-06-08 NOTE — Patient Instructions (Signed)
Check Blood pressure daily and record and take to next cardiology appt and bring to next appt here.

## 2018-06-14 ENCOUNTER — Encounter: Payer: Self-pay | Admitting: Cardiovascular Disease

## 2018-06-14 ENCOUNTER — Ambulatory Visit: Payer: Medicaid Other | Admitting: Cardiovascular Disease

## 2018-06-14 VITALS — BP 140/68 | HR 83 | Ht 65.0 in | Wt 179.0 lb

## 2018-06-14 DIAGNOSIS — I5042 Chronic combined systolic (congestive) and diastolic (congestive) heart failure: Secondary | ICD-10-CM

## 2018-06-14 DIAGNOSIS — I10 Essential (primary) hypertension: Secondary | ICD-10-CM | POA: Diagnosis not present

## 2018-06-14 DIAGNOSIS — I5043 Acute on chronic combined systolic (congestive) and diastolic (congestive) heart failure: Secondary | ICD-10-CM | POA: Insufficient documentation

## 2018-06-14 DIAGNOSIS — N185 Chronic kidney disease, stage 5: Secondary | ICD-10-CM | POA: Diagnosis not present

## 2018-06-14 DIAGNOSIS — E1169 Type 2 diabetes mellitus with other specified complication: Secondary | ICD-10-CM

## 2018-06-14 DIAGNOSIS — E669 Obesity, unspecified: Secondary | ICD-10-CM

## 2018-06-14 DIAGNOSIS — D631 Anemia in chronic kidney disease: Secondary | ICD-10-CM

## 2018-06-14 NOTE — Progress Notes (Signed)
Cardiology Office Note:    Date:  06/14/2018   ID:  KRISSIA SCHREIER, DOB 1956/09/14, MRN [id number]  PCP:  Ladell Pier, MD  Cardiologist:  Sanda Klein, MD    Referring MD: Ladell Pier, MD   Chief Complaint  Patient presents with  . Hospitalization Follow-up    drs want her to stop lexapro r/t low bp  Congestive heart failure  History of Present Illness:    Patricia [name] is a 61 y.o. female with a hx of CHF with severely depressed left ventricular systolic function (EF 93-26% by echo February 2018), improved left ventricular systolic function medical therapy (EF 40% by nuclear QGS July 2018).   She was seen in the emergency room on August 2017 for symptomatic hypotension 74/50 with near-syncope, but by the time she was seen in the emergency room her blood pressure had normalized to 147/79.  Her antihypertensive medications were reduced.  She is still reluctant to go the next up to get ready for peritoneal dialysis.  She complains of fatigue, occasional nausea, occasional anorexia.  She has to climb 14 steps to.  This causes fatigue but does not cause dyspnea.  She does not have edema.  She still makes urine 6 or 7 times a day.  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  When she was seen in the emergency room her QTC was measured at 560 ms, but I suspect this is artifactual due to a poor quality ECG baseline.  Repeat ECG in office today shows a QTC of 508 ms, typical for her.  She has no personal history of ventricular arrhythmia.  There is no family history of unexplained sudden death.  The etiology of her cardiomyopathy remains uncertain, since coronary angiography has been deferred due to severe anemia and moderate to severe kidney disease. She sees Dr. Pearson Grippe. She has had long-standing poorly controlled diabetes mellitus complicated by nephropathy/proteinuria, sensory neuropathy. 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 Colonoscopy performed June 03 2017 showed diverticulosis without active bleeding, EGD showed antral gastritis, felt to be likely secondary to aspirin therapy (H pylori negative). She is now receiving her next injections and after receiving Feraheme has normalized iron stores. Her most recent hemoglobin was 9.2, iron levels were normal.  Creatinine on August 22 was 6.27, BUN was 97.   Past Medical History:  Diagnosis Date  . Allergy   . Anemia   . Anxiety   . Blood transfusion without reported diagnosis   . Cardiomyopathy ([place]) 11/2016   no ischemic eval due to CKD  . CHF (congestive heart failure) ([place])   . CKD (chronic kidney disease), stage IV ([place])   . Depression   . Diabetes mellitus without complication ([place])    type 2  . Dyspnea   . Hypertension   . Obesity     Past Surgical History:  Procedure Laterality Date  . ABDOMINAL HYSTERECTOMY    . BREAST SURGERY     reduction  . [place]  . LIPOMA EXCISION     back  Dr. Excell Seltzer 03-22-18  . LIPOMA EXCISION N/A 03/22/2018   Procedure: EXCISION OF BACK LIPOMA;  Surgeon: Excell Seltzer, MD;  Location: [facility];  Service: General;  Laterality: N/A;  . REDUCTION MAMMAPLASTY Bilateral     Current Medications: Current Meds  Medication Sig  . acetaminophen (TYLENOL) 500 MG tablet Take 500-1,000 mg by mouth every 6 (six)  hours as needed (for pain or headaches).  Marland Kitchen aspirin EC 81 MG tablet Take 1 tablet (81 mg total) by mouth daily.  Marland Kitchen atorvastatin (LIPITOR) 20 MG tablet Take 0.5 tablets (10 mg total) by mouth daily.  Patricia [name] 550 MG CAPS Take 550 mg by mouth daily as needed (to boost the immune system).   . bismuth subsalicylate (PEPTO BISMOL) 262 MG/15ML suspension Take 30 mLs by mouth every 6 (six) hours as needed for indigestion.   . calcitRIOL (ROCALTROL) 0.25 MCG capsule Take 0.25 mcg by mouth daily.   . calcium carbonate (TUMS - DOSED IN MG ELEMENTAL CALCIUM) 500 MG chewable tablet Chew 1  tablet by mouth 3 (three) times daily as needed for indigestion or heartburn.   . carvedilol (COREG) 25 MG tablet TAKE 1 TABLET BY MOUTH TWICE DAILY WITH A MEAL  . escitalopram (LEXAPRO) 20 MG tablet TAKE 1 TABLET BY MOUTH AT BEDTIME.  . furosemide (LASIX) 40 MG tablet TAKE 1 TABLET BY MOUTH 2 TIMES DAILY. (Patient taking differently: Take 80 mg by mouth daily. )  . gabapentin (NEURONTIN) 100 MG capsule Take 1 capsule (100 mg total) by mouth at bedtime.  . Garlic 269 MG TABS Take 100 mg by mouth daily.   Marland Kitchen glipiZIDE (GLUCOTROL) 10 MG tablet TAKE 1 TABLET BY MOUTH 2 TIMES DAILY BEFORE A MEAL.  Marland Kitchen glucose 4 GM chewable tablet Chew 1 tablet by mouth as needed for low blood sugar.  . hydrALAZINE (APRESOLINE) 50 MG tablet Take 1 tablet (50 mg total) by mouth 2 (two) times daily.  . insulin aspart (NOVOLOG) 100 UNIT/ML injection 3 units before meals. (Patient taking differently: Inject 5 Units into the skin 3 (three) times daily as needed (before meals for high blood sugar levels). )  . Insulin Glargine (LANTUS SOLOSTAR) 100 UNIT/ML Solostar Pen Inject 27 Units into the skin 2 (two) times daily.  . Insulin Pen Needle (TRUEPLUS PEN NEEDLES) 32G X 4 MM MISC Use as directed to inject insulin.  . Insulin Pen Needle 31G X 5 MM MISC Use as directed  . isosorbide mononitrate (IMDUR) 30 MG 24 hr tablet Take 0.5 tablets (15 mg total) by mouth daily.   Current Facility-Administered Medications for the 06/14/18 encounter (Office Visit) with Sanda Klein, MD  Medication  . INSTA-GLUCOSE 77.4 % GEL 1 Tube    Allergies:   Codone [hydrocodone]; Norvasc [amlodipine besylate]; and Sulfa antibiotics   Social History   Socioeconomic History  . Marital status: Divorced    Spouse name: Not on file  . Number of children: 2  . Years of education: Not on file  . Highest education level: Not on file  Occupational History  . Occupation: disabled  Social Needs  . Financial resource strain: Not on file  . Food  insecurity:    Worry: Not on file    Inability: Not on file  . Transportation needs:    Medical: Not on file    Non-medical: Not on file  Tobacco Use  . Smoking status: Never Smoker  . Smokeless tobacco: Never Used  Substance and Sexual Activity  . Alcohol use: No  . Drug use: No  . Sexual activity: Not Currently  Lifestyle  . Physical activity:    Days per week: Not on file    Minutes per session: Not on file  . Stress: Not on file  Relationships  . Social connections:    Talks on phone: Not on file    Gets together: Not  on file    Attends religious service: Not on file    Active member of club or organization: Not on file    Attends meetings of clubs or organizations: Not on file    Relationship status: Not on file  Other Topics Concern  . Not on file  Social History Narrative  . Not on file     Family History: The patient's family history includes Diabetes in her brother; Heart Problems in her maternal grandmother; Heart disease in her maternal grandfather; Kidney disease in her brother; Stomach cancer in her brother. There is no history of Rectal cancer, Esophageal cancer, Liver cancer, or Colon cancer. ROS:   Please see the history of present illness.  All other systems are reviewed and are negative  EKGs/Labs/Other Studies Reviewed:    EKG:  EKG is ordered today.  It shows normal sinus rhythm, voltage criteria for LVH, nonspecific repolarization changes (flat T waves V4-V6), QTC 508 ms  Recent Labs: 06/02/2018: Magnesium 2.1; Platelets 187 06/07/2018: BUN 94; Creatinine, Ser 6.17; Hemoglobin 9.2; Potassium 4.2; Sodium 136  Recent Lipid Panel    Component Value Date/Time   CHOL 144 12/01/2016 0616   TRIG 129 12/01/2016 0616   HDL 36 (L) 12/01/2016 0616   CHOLHDL 4.0 12/01/2016 0616   VLDL 26 12/01/2016 0616   LDLCALC 82 12/01/2016 0616    Physical Exam:    VS:  BP 140/68   Pulse 83   Ht 5\' 5"  (1.651 m)   Wt 179 lb (81.2 kg)   SpO2 99%   BMI 29.79 kg/m      Wt Readings from Last 3 Encounters:  06/14/18 179 lb (81.2 kg)  06/08/18 178 lb (80.7 kg)  05/23/18 178 lb 9.6 oz (81 kg)      General: Alert, oriented x3, no distress, looks tired, but is not pale Head: no evidence of trauma, PERRL, EOMI, no exophtalmos or lid lag, no myxedema, no xanthelasma; normal ears, nose and oropharynx Neck: normal jugular venous pulsations and no hepatojugular reflux; brisk carotid pulses without delay and no carotid bruits Chest: clear to auscultation, no signs of consolidation by percussion or palpation, normal fremitus, symmetrical and full respiratory excursions Cardiovascular: normal position and quality of the apical impulse, regular rhythm, normal first and second heart sounds, no murmurs, rubs, S4 gallop is present Abdomen: no tenderness or distention, no masses by palpation, no abnormal pulsatility or arterial bruits, normal bowel sounds, no hepatosplenomegaly Extremities: no clubbing, cyanosis or edema; 2+ radial, ulnar and brachial pulses bilaterally; 2+ right femoral, posterior tibial and dorsalis pedis pulses; 2+ left femoral, posterior tibial and dorsalis pedis pulses; no subclavian or femoral bruits Neurological: grossly nonfocal Psych: Normal mood and affect    ASSESSMENT:    1. Chronic combined systolic and diastolic heart failure ([place])   2. Essential hypertension   3. CKD (chronic kidney disease) stage 5, GFR less than 15 ml/min (HCC)   4. Anemia due to stage 5 chronic kidney disease, not on chronic dialysis ([place])   5. Diabetes mellitus type 2 in obese [facility])    PLAN:    In order of problems listed above:  1. CHF: Clinically euvolemic, NYHA functional class II.  We do not have a good expansion for cardiomyopathy and there is still a suspicion that she could have coronary disease (multiple risk factors include years of poorly controlled diabetes, hypertension, early surgical menopause and chronic kidney disease). Although the nuclear  perfusion pattern was normal, she could have stenosis of  a dominant left coronary artery. Once she is officially declared ESRD and starts dialysis, I think we have to proceed with coronary angiography and revascularization if appropriate. 2. HTN: Her blood pressure is acceptable, agree with recent changes made to her antihypertensives (reduced the dose of hydralazine).  Suspect volume status is highly variable and this explains occasional problems with hypotension. 3. CKD 5: I think there is impending need for dialysis.  Strongly encouraged her to prepare for this by getting her peritoneal catheter implanted as soon as possible, since she will not be able to start peritoneal dialysis for several weeks after the surgery. 4. Anemia: Mild. No longer iron deficient. No serious sources of bleeding by upper and lower GI endoscopy. On EPO. 5. DM: Likely cause of her nephropathy and increases the likelihood of multivessel CAD. Last hemoglobin A1c 5.7%, a marked improvement from 8.9% a year ago and 14% 2 years ago.  Unfortunately, this is likely due to worsening renal failure.   Medication Adjustments/Labs and Tests Ordered: Current medicines are reviewed at length with the patient today.  Concerns regarding medicines are outlined above.  No orders of the defined types were placed in this encounter.  No orders of the defined types were placed in this encounter.   Signed, Sanda Klein, MD  06/14/2018 12:38 PM    [place]

## 2018-06-14 NOTE — Patient Instructions (Signed)
Dr Croitoru recommends that you schedule a follow-up appointment in 6 months. You will receive a reminder letter in the mail two months in advance. If you don't receive a letter, please call our office to schedule the follow-up appointment.  If you need a refill on your cardiac medications before your next appointment, please call your pharmacy. 

## 2018-06-20 NOTE — Addendum Note (Signed)
Addended by: Diana Eves on: 06/20/2018 08:26 AM   Modules accepted: Orders

## 2018-06-28 ENCOUNTER — Encounter (HOSPITAL_COMMUNITY)
Admission: RE | Admit: 2018-06-28 | Discharge: 2018-06-28 | Disposition: A | Discharge status: Home / Self Care | Payer: Medicaid Other | Source: Ambulatory Visit | Attending: Nephrology | Admitting: Nephrology

## 2018-06-28 VITALS — BP 129/61 | HR 80 | Temp 97.8°F | Resp 18

## 2018-06-28 DIAGNOSIS — D631 Anemia in chronic kidney disease: Secondary | ICD-10-CM | POA: Insufficient documentation

## 2018-06-28 DIAGNOSIS — N184 Chronic kidney disease, stage 4 (severe): Secondary | ICD-10-CM | POA: Diagnosis not present

## 2018-06-28 LAB — POCT HEMOGLOBIN-HEMACUE: HEMOGLOBIN: 9.2 g/dL — AB (ref 12.0–15.0)

## 2018-06-28 MED ORDER — DARBEPOETIN ALFA 200 MCG/0.4ML IJ SOSY
PREFILLED_SYRINGE | INTRAMUSCULAR | Status: AC
  Administered 2018-06-28: 200 ug via SUBCUTANEOUS
  Filled 2018-06-28: qty 0.4

## 2018-06-28 MED ORDER — DARBEPOETIN ALFA 200 MCG/0.4ML IJ SOSY: 200.0000 ug | PREFILLED_SYRINGE | INTRAMUSCULAR | Status: DC

## 2018-07-02 ENCOUNTER — Other Ambulatory Visit: Payer: Self-pay

## 2018-07-02 ENCOUNTER — Encounter (HOSPITAL_COMMUNITY): Payer: Self-pay | Admitting: Emergency Medicine

## 2018-07-02 ENCOUNTER — Ambulatory Visit (HOSPITAL_COMMUNITY)
Admission: EM | Admit: 2018-07-02 | Discharge: 2018-07-02 | Disposition: A | Discharge status: Home / Self Care | Payer: Medicaid Other | Attending: Radiology | Admitting: Radiology

## 2018-07-02 DIAGNOSIS — J069 Acute upper respiratory infection, unspecified: Secondary | ICD-10-CM | POA: Diagnosis not present

## 2018-07-02 MED ORDER — LORATADINE-PSEUDOEPHEDRINE ER 5-120 MG PO TB12: 1.0000 | ORAL_TABLET | Freq: Two times a day (BID) | ORAL | 0 refills | Status: AC

## 2018-07-02 MED ORDER — AMOXICILLIN-POT CLAVULANATE 875-125 MG PO TABS: 1.0000 | ORAL_TABLET | Freq: Two times a day (BID) | ORAL | 0 refills | Status: DC

## 2018-07-02 MED ORDER — FLUTICASONE PROPIONATE 50 MCG/ACT NA SUSP: 1.0000 | Freq: Every day | NASAL | 0 refills | Status: DC

## 2018-07-02 NOTE — ED Triage Notes (Signed)
Patient states she has a sinus headache and cough.  Reports symptoms for 2-3 days.  Cough interrupting sleep.  Facial stuffiness. Minimal blood with blowing of nose

## 2018-07-02 NOTE — ED Provider Notes (Signed)
[facility]    CSN: [id number] Arrival date & time: 07/02/18  1026     History   Chief Complaint Chief Complaint  Patient presents with  . URI    HPI Patricia [name] is a 62 y.o. female.   62 year old female presents with headache and sinus pressure x3 days.  She denies any fevers, cough, sore throat nausea, vomiting, light sensitivity or vision issues..  Condition is acute in nature.  Condition is made worse by nothing.  Condition is made better by nothing.  Patient denies any improvement with the use of Benadryl at home.     Past Medical History:  Diagnosis Date  . Allergy   . Anemia   . Anxiety   . Blood transfusion without reported diagnosis   . Cardiomyopathy ([place]) 11/2016   no ischemic eval due to CKD  . CHF (congestive heart failure) ([place])   . CKD (chronic kidney disease), stage IV ([place])   . Depression   . Diabetes mellitus without complication ([name])    type 2  . Dyspnea   . Hypertension   . Obesity     Patient Active Problem List   Diagnosis Date Noted  . Chronic combined systolic and diastolic heart failure ([place]) 06/14/2018  . CKD (chronic kidney disease) stage 5, GFR less than 15 ml/min (HCC) 06/14/2018  . Anemia due to stage 5 chronic kidney disease, not on chronic dialysis ([place]) 06/14/2018  . Systolic CHF ([name]) 97/35/3299  . Diabetes mellitus type 2 with complications ([name]) 24/26/8341  . Transaminasemia 11/29/2016  . Cardiomyopathy ([place]) 11/12/2016  . Lipoma 10/21/2016  . CKD (chronic kidney disease) stage 4, GFR 15-29 ml/min (HCC) 08/10/2016  . Anxiety and depression 12/12/2015  . Diabetic nephropathy associated with type 2 diabetes mellitus ([place]) 11/15/2014  . Moderate nonproliferative diabetic retinopathy(362.05) 04/17/2014  . 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 Essential hypertension 03/28/2014  . Dental caries 03/28/2014  . Dyslipidemia 04/18/2013    Past Surgical History:  Procedure Laterality Date  . ABDOMINAL  HYSTERECTOMY    . BREAST SURGERY     reduction  . [place]  . LIPOMA EXCISION     back  Dr. Excell Seltzer 03-22-18  . LIPOMA EXCISION N/A 03/22/2018   Procedure: EXCISION OF BACK LIPOMA;  Surgeon: Excell Seltzer, MD;  Location: [facility];  Service: General;  Laterality: N/A;  . REDUCTION MAMMAPLASTY Bilateral     OB History    Gravida  2   Para  2   Term  2   Preterm      AB      Living  2     SAB      TAB      Ectopic      Multiple      Live Births               Home Medications    Prior to Admission medications   Medication Sig Start Date End Date Taking? Authorizing Provider  acetaminophen (TYLENOL) 500 MG tablet Take 500-1,000 mg by mouth every 6 (six) hours as needed (for pain or headaches).   Yes [provider]  aspirin EC 81 MG tablet Take 1 tablet (81 mg total) by mouth daily. 01/04/17  Yes Langeland, Dawn T, MD  atorvastatin (LIPITOR) 20 MG tablet Take 0.5 tablets (10 mg total) by mouth daily. 06/03/18  Yes Ladell Pier, MD  calcitRIOL (ROCALTROL) 0.25 MCG capsule Take 0.25 mcg by mouth daily.  02/15/18  Yes [provider]  calcium carbonate (TUMS - DOSED IN MG ELEMENTAL CALCIUM) 500 MG chewable tablet Chew 1 tablet by mouth 3 (three) times daily as needed for indigestion or heartburn.    Yes [provider]  carvedilol (COREG) 25 MG tablet TAKE 1 TABLET BY MOUTH TWICE DAILY WITH A MEAL 04/21/18  Yes Strader, [place] M, PA-C  furosemide (LASIX) 40 MG tablet TAKE 1 TABLET BY MOUTH 2 TIMES DAILY. Patient taking differently: Take 80 mg by mouth daily.  11/19/17  Yes Ladell Pier, MD  Garlic 315 MG TABS Take 100 mg by mouth daily.    Yes [provider]  glipiZIDE (GLUCOTROL) 10 MG tablet TAKE 1 TABLET BY MOUTH 2 TIMES DAILY BEFORE A MEAL. 06/03/18  Yes Ladell Pier, MD  hydrALAZINE (APRESOLINE) 50 MG tablet Take 1 tablet (50 mg total) by mouth 2 (two) times daily. 06/08/18  Yes Freeman Caldron  M, PA-C  isosorbide mononitrate (IMDUR) 30 MG 24 hr tablet Take 0.5 tablets (15 mg total) by mouth daily. 05/23/18  Yes Lendon Colonel, NP  Bee Pollen 550 MG CAPS Take 550 mg by mouth daily as needed (to boost the immune system).     [provider]  bismuth subsalicylate (PEPTO BISMOL) 262 MG/15ML suspension Take 30 mLs by mouth every 6 (six) hours as needed for indigestion.     [provider]  escitalopram (LEXAPRO) 20 MG tablet TAKE 1 TABLET BY MOUTH AT BEDTIME. 03/30/18   Ladell Pier, MD  gabapentin (NEURONTIN) 100 MG capsule Take 1 capsule (100 mg total) by mouth at bedtime. 11/19/17   Ladell Pier, MD  glucose 4 GM chewable tablet Chew 1 tablet by mouth as needed for low blood sugar.    [provider]  insulin aspart (NOVOLOG) 100 UNIT/ML injection 3 units before meals. Patient taking differently: Inject 5 Units into the skin 3 (three) times daily as needed (before meals for high blood sugar levels).  04/06/17   Ladell Pier, MD  Insulin Glargine (LANTUS SOLOSTAR) 100 UNIT/ML Solostar Pen Inject 27 Units into the skin 2 (two) times daily. 01/27/18   Ladell Pier, MD  Insulin Pen Needle (TRUEPLUS PEN NEEDLES) 32G X 4 MM MISC Use as directed to inject insulin. 03/25/18   Ladell Pier, MD  Insulin Pen Needle 31G X 5 MM MISC Use as directed 03/28/18   Ladell Pier, MD    Family History Family History  Problem Relation Age of Onset  . Diabetes Brother   . Stomach cancer Brother   . Kidney disease Brother   . Heart Problems Maternal Grandmother        pacemaker  . Heart disease Maternal Grandfather   . Rectal cancer Neg Hx   . Esophageal cancer Neg Hx   . Liver cancer Neg Hx   . Colon cancer Neg Hx     Social History Social History   Tobacco Use  . Smoking status: Never Smoker  . Smokeless tobacco: Never Used  Substance Use Topics  . Alcohol use: No  . Drug use: No     Allergies   Codone [hydrocodone]; Norvasc  [amlodipine besylate]; and Sulfa antibiotics   Review of Systems Review of Systems  Constitutional: Negative for chills and fever.  HENT: Positive for sinus pressure. Negative for congestion, dental problem, ear pain and sore throat.   Eyes: Negative for pain and visual disturbance.  Respiratory: Negative for cough and shortness of  breath.   Cardiovascular: Negative for chest pain and palpitations.  Gastrointestinal: Negative for abdominal pain and vomiting.  Genitourinary: Negative for dysuria and hematuria.  Musculoskeletal: Negative for arthralgias and back pain.  Skin: Negative for color change and rash.  Neurological: Negative for seizures and syncope.  All other systems reviewed and are negative.    Physical Exam Triage Vital Signs ED Triage Vitals  Enc Vitals Group     BP 07/02/18 1148 (!) 184/74     Pulse Rate 07/02/18 1148 77     Resp 07/02/18 1148 20     Temp 07/02/18 1148 97.9 F (36.6 C)     Temp Source 07/02/18 1148 Oral     SpO2 07/02/18 1148 100 %     Weight --      Height --      Head Circumference --      Peak Flow --      Pain Score 07/02/18 1145 8     Pain Loc --      Pain Edu? --      Excl. in [place]? --    No data found.  Updated Vital Signs BP (!) 184/74 (BP Location: Right Arm) Comment: has not taken medicine today  Pulse 77   Temp 97.9 F (36.6 C) (Oral)   Resp 20   SpO2 100%   Visual Acuity Right Eye Distance:   Left Eye Distance:   Bilateral Distance:    Right Eye Near:   Left Eye Near:    Bilateral Near:     Physical Exam  Constitutional: She is oriented to person, place, and time. She appears well-developed and well-nourished.  HENT:  Head: Normocephalic and atraumatic.  Pain with palpation to maxillary and ethmoid sinuses  Eyes: Conjunctivae are normal.  Neck: Normal range of motion.  Pulmonary/Chest: Effort normal.  Musculoskeletal: Normal range of motion.  Neurological: She is alert and oriented to person, place, and time.    Skin: Skin is warm.  Psychiatric: She has a normal mood and affect.  Nursing note and vitals reviewed.    UC Treatments / Results  Labs (all labs ordered are listed, but only abnormal results are displayed) Labs Reviewed - No data to display  EKG None  Radiology No results found.  Procedures Procedures (including critical care time)  Medications Ordered in UC Medications - No data to display  Initial Impression / Assessment and Plan / UC Course  I have reviewed the triage vital signs and the nursing notes.  Pertinent labs & imaging results that were available during my care of the patient were reviewed by me and considered in my medical decision making (see chart for details).      Final Clinical Impressions(s) / UC Diagnoses   Final diagnoses:  None   Discharge Instructions   None    ED Prescriptions    None     Controlled Substance Prescriptions [place] Controlled Substance Registry consulted? Not Applicable   Jacqualine Mau, NP 07/02/18 1218

## 2018-07-06 MED FILL — ISOSORBIDE MN ER 30 MG TAB: 30 | 30 days supply | Qty: 15 | Fill #1

## 2018-07-12 ENCOUNTER — Other Ambulatory Visit: Payer: Self-pay

## 2018-07-12 DIAGNOSIS — Z794 Long term (current) use of insulin: Secondary | ICD-10-CM

## 2018-07-12 DIAGNOSIS — E785 Hyperlipidemia, unspecified: Secondary | ICD-10-CM

## 2018-07-12 DIAGNOSIS — E1165 Type 2 diabetes mellitus with hyperglycemia: Secondary | ICD-10-CM

## 2018-07-12 MED ORDER — ATORVASTATIN CALCIUM 20 MG PO TABS: 10.0000 mg | ORAL_TABLET | Freq: Every day | ORAL | 0 refills | Status: DC

## 2018-07-12 MED ORDER — GLIPIZIDE 10 MG PO TABS: ORAL_TABLET | ORAL | 0 refills | Status: DC

## 2018-07-12 MED FILL — glipiZIDE 10 MG TABS: 10 | 30 days supply | Qty: 60 | Fill #0

## 2018-07-12 MED FILL — ATORVASTATIN 20 MG TABLET: 20 | 30 days supply | Qty: 15 | Fill #0

## 2018-07-15 ENCOUNTER — Encounter: Payer: Self-pay | Admitting: Internal Medicine

## 2018-07-15 ENCOUNTER — Ambulatory Visit: Payer: Medicaid Other | Attending: Internal Medicine | Admitting: Internal Medicine

## 2018-07-15 VITALS — BP 154/76 | HR 80 | Temp 97.9°F | Resp 16 | Ht 64.0 in | Wt 175.8 lb

## 2018-07-15 DIAGNOSIS — E1121 Type 2 diabetes mellitus with diabetic nephropathy: Secondary | ICD-10-CM | POA: Insufficient documentation

## 2018-07-15 DIAGNOSIS — F419 Anxiety disorder, unspecified: Secondary | ICD-10-CM | POA: Insufficient documentation

## 2018-07-15 DIAGNOSIS — E11319 Type 2 diabetes mellitus with unspecified diabetic retinopathy without macular edema: Secondary | ICD-10-CM | POA: Insufficient documentation

## 2018-07-15 DIAGNOSIS — E785 Hyperlipidemia, unspecified: Secondary | ICD-10-CM | POA: Insufficient documentation

## 2018-07-15 DIAGNOSIS — F329 Major depressive disorder, single episode, unspecified: Secondary | ICD-10-CM | POA: Insufficient documentation

## 2018-07-15 DIAGNOSIS — N186 End stage renal disease: Secondary | ICD-10-CM | POA: Diagnosis not present

## 2018-07-15 DIAGNOSIS — I132 Hypertensive heart and chronic kidney disease with heart failure and with stage 5 chronic kidney disease, or end stage renal disease: Secondary | ICD-10-CM | POA: Insufficient documentation

## 2018-07-15 DIAGNOSIS — E118 Type 2 diabetes mellitus with unspecified complications: Secondary | ICD-10-CM | POA: Diagnosis not present

## 2018-07-15 DIAGNOSIS — D631 Anemia in chronic kidney disease: Secondary | ICD-10-CM | POA: Insufficient documentation

## 2018-07-15 DIAGNOSIS — Z992 Dependence on renal dialysis: Secondary | ICD-10-CM | POA: Insufficient documentation

## 2018-07-15 DIAGNOSIS — Z885 Allergy status to narcotic agent status: Secondary | ICD-10-CM | POA: Diagnosis not present

## 2018-07-15 DIAGNOSIS — Z7982 Long term (current) use of aspirin: Secondary | ICD-10-CM | POA: Diagnosis not present

## 2018-07-15 DIAGNOSIS — Z79899 Other long term (current) drug therapy: Secondary | ICD-10-CM | POA: Diagnosis not present

## 2018-07-15 DIAGNOSIS — Z794 Long term (current) use of insulin: Secondary | ICD-10-CM | POA: Diagnosis not present

## 2018-07-15 DIAGNOSIS — Z882 Allergy status to sulfonamides status: Secondary | ICD-10-CM | POA: Diagnosis not present

## 2018-07-15 DIAGNOSIS — I10 Essential (primary) hypertension: Secondary | ICD-10-CM

## 2018-07-15 DIAGNOSIS — E1122 Type 2 diabetes mellitus with diabetic chronic kidney disease: Secondary | ICD-10-CM | POA: Diagnosis not present

## 2018-07-15 DIAGNOSIS — I5042 Chronic combined systolic (congestive) and diastolic (congestive) heart failure: Secondary | ICD-10-CM | POA: Insufficient documentation

## 2018-07-15 LAB — POCT GLYCOSYLATED HEMOGLOBIN (HGB A1C): HEMOGLOBIN A1C: 6.3 % — AB (ref 4.0–5.6)

## 2018-07-15 LAB — GLUCOSE, POCT (MANUAL RESULT ENTRY): POC Glucose: 108 mg/dl — AB (ref 70–99)

## 2018-07-15 MED ORDER — ATORVASTATIN CALCIUM 20 MG PO TABS: 10.0000 mg | ORAL_TABLET | Freq: Every day | ORAL | 6 refills | Status: DC

## 2018-07-15 NOTE — Patient Instructions (Signed)
Stay off the Novolog.  Let me know if you have any low blood sugar episodes.  If you do, we will need to cut the dose of the Glipizide.  Please bring in blood sugar readings on follow up visit.

## 2018-07-15 NOTE — Progress Notes (Signed)
Patient is here to follow-up on diabetes and blood pressure.

## 2018-07-15 NOTE — Progress Notes (Signed)
Patient ID: Patricia [name], female    DOB: 06-23-1956  MRN: [id number]  CC: Follow-up   Subjective: Patricia [name] is a 62 y.o. female who presents for chronic disease management.  Daughter is with her.   Her concerns today include:  Patient with history of HTN, HL, DM type 2 with retinopathy,neuropathyand nephropathy (CKD stage 4),s-CHF with EF improved from 25-30% to 40% as of 04/2017 possible Takotsubo variant, mixed anemia ACDand IDA, HL and depression  Long QT: There was concern about this based on an ER visit several months ago.  Since then she has seen her cardiologist.  He reports that this is not changed and the QT interval was actually a little less on repeat EKG done in his office.  ESRD/HTN:   She has been following up regularly with the nephrologist.  She is at the point of needing to start dialysis.  She is considering peritoneal dialysis but has not made a decision as yet to have procedure done so that she would be ready to start.  She is compliant with blood pressure medications.  Reports that isosorbide was decreased to 15 mg daily by cardiology and nephrology because she was having dizziness with it.  DM:  Checking BS BID before BF and bedtime.  She does not have a log with her.  Reports A.m range 92-120; bedtime range 175-230.  Confirms Lantus use.  Not taking Novolog due to hypoglycemia when she takes it.   Patient Active Problem List   Diagnosis Date Noted  . Chronic combined systolic and diastolic heart failure ([place]) 06/14/2018  . CKD (chronic kidney disease) stage 5, GFR less than 15 ml/min (HCC) 06/14/2018  . Anemia due to stage 5 chronic kidney disease, not on chronic dialysis ([place]) 06/14/2018  . Systolic CHF ([place]) 75/64/3329  . Diabetes mellitus type 2 with complications ([place]) 51/88/4166  . Transaminasemia 11/29/2016  . Cardiomyopathy ([place]) 11/12/2016  . Lipoma 10/21/2016  . CKD (chronic kidney disease) stage 4, GFR 15-29 ml/min (HCC) 08/10/2016  . Anxiety and  depression 12/12/2015  . Diabetic nephropathy associated with type 2 diabetes mellitus ([place]) 11/15/2014  . Moderate nonproliferative diabetic retinopathy(362.05) 04/17/2014  . 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 Essential hypertension 03/28/2014  . Dental caries 03/28/2014  . Dyslipidemia 04/18/2013     Current Outpatient Medications on File Prior to Visit  Medication Sig Dispense Refill  . acetaminophen (TYLENOL) 500 MG tablet Take 500-1,000 mg by mouth every 6 (six) hours as needed (for pain or headaches).    Marland Kitchen aspirin EC 81 MG tablet Take 1 tablet (81 mg total) by mouth daily. 90 tablet 3  . Bee Pollen 550 MG CAPS Take 550 mg by mouth daily as needed (to boost the immune system).     . bismuth subsalicylate (PEPTO BISMOL) 262 MG/15ML suspension Take 30 mLs by mouth every 6 (six) hours as needed for indigestion.     . calcitRIOL (ROCALTROL) 0.25 MCG capsule Take 0.25 mcg by mouth daily.   11  . calcium carbonate (TUMS - DOSED IN MG ELEMENTAL CALCIUM) 500 MG chewable tablet Chew 1 tablet by mouth 3 (three) times daily as needed for indigestion or heartburn.     . carvedilol (COREG) 25 MG tablet TAKE 1 TABLET BY MOUTH TWICE DAILY WITH A MEAL 60 tablet 11  . escitalopram (LEXAPRO) 20 MG tablet TAKE 1 TABLET BY MOUTH AT BEDTIME. 30 tablet 2  . fluticasone (FLONASE) 50 MCG/ACT nasal spray Place 1 spray into both nostrils  daily. 16 g 0  . furosemide (LASIX) 40 MG tablet TAKE 1 TABLET BY MOUTH 2 TIMES DAILY. (Patient taking differently: Take 80 mg by mouth daily. ) 60 tablet 1  . gabapentin (NEURONTIN) 100 MG capsule Take 1 capsule (100 mg total) by mouth at bedtime. 90 capsule 3  . Garlic 001 MG TABS Take 100 mg by mouth daily.     Marland Kitchen glipiZIDE (GLUCOTROL) 10 MG tablet TAKE 1 TABLET BY MOUTH 2 TIMES DAILY BEFORE A MEAL. MUST MAKE APPT FOR FURTHER REFILLS 60 tablet 0  . glucose 4 GM chewable tablet Chew 1 tablet by mouth as needed for low blood sugar.    . hydrALAZINE (APRESOLINE) 50 MG  tablet Take 1 tablet (50 mg total) by mouth 2 (two) times daily. 60 tablet 2  . insulin aspart (NOVOLOG) 100 UNIT/ML injection 3 units before meals. (Patient taking differently: Inject 5 Units into the skin 3 (three) times daily as needed (before meals for high blood sugar levels). ) 60 mL 3  . Insulin Glargine (LANTUS SOLOSTAR) 100 UNIT/ML Solostar Pen Inject 27 Units into the skin 2 (two) times daily. 45 mL 0  . Insulin Pen Needle (TRUEPLUS PEN NEEDLES) 32G X 4 MM MISC Use as directed to inject insulin. 100 each 3  . Insulin Pen Needle 31G X 5 MM MISC Use as directed 100 each 12  . isosorbide mononitrate (IMDUR) 30 MG 24 hr tablet Take 0.5 tablets (15 mg total) by mouth daily. 30 tablet 1  . loratadine-pseudoephedrine (CLARITIN-D 12 HOUR) 5-120 MG tablet Take 1 tablet by mouth 2 (two) times daily for 14 days. 28 tablet 0   No current facility-administered medications on file prior to visit.     Allergies  Allergen Reactions  . Codone [Hydrocodone] Itching  . Norvasc [Amlodipine Besylate] Swelling    Lower extremity?  . Sulfa Antibiotics Itching and Rash    Social History   Socioeconomic History  . Marital status: Divorced    Spouse name: Not on file  . Number of children: 2  . Years of education: Not on file  . Highest education level: Not on file  Occupational History  . Occupation: disabled  Social Needs  . Financial resource strain: Not on file  . Food insecurity:    Worry: Not on file    Inability: Not on file  . Transportation needs:    Medical: Not on file    Non-medical: Not on file  Tobacco Use  . Smoking status: Never Smoker  . Smokeless tobacco: Never Used  Substance and Sexual Activity  . Alcohol use: No  . Drug use: No  . Sexual activity: Not Currently  Lifestyle  . Physical activity:    Days per week: Not on file    Minutes per session: Not on file  . Stress: Not on file  Relationships  . Social connections:    Talks on phone: Not on file    Gets  together: Not on file    Attends religious service: Not on file    Active member of club or organization: Not on file    Attends meetings of clubs or organizations: Not on file    Relationship status: Not on file  . Intimate partner violence:    Fear of current or ex partner: Not on file    Emotionally abused: Not on file    Physically abused: Not on file    Forced sexual activity: Not on file  Other Topics Concern  .  Not on file  Social History Narrative  . Not on file    Family History  Problem Relation Age of Onset  . Diabetes Brother   . Stomach cancer Brother   . Kidney disease Brother   . Heart Problems Maternal Grandmother        pacemaker  . Heart disease Maternal Grandfather   . Rectal cancer Neg Hx   . Esophageal cancer Neg Hx   . Liver cancer Neg Hx   . Colon cancer Neg Hx     Past Surgical History:  Procedure Laterality Date  . ABDOMINAL HYSTERECTOMY    . BREAST SURGERY     reduction  . [place]  . LIPOMA EXCISION     back  Dr. Excell Seltzer 03-22-18  . LIPOMA EXCISION N/A 03/22/2018   Procedure: EXCISION OF BACK LIPOMA;  Surgeon: Excell Seltzer, MD;  Location: [facility];  Service: General;  Laterality: N/A;  . REDUCTION MAMMAPLASTY Bilateral     ROS: Review of Systems Negative except as above. PHYSICAL EXAM: BP (!) 154/76 (BP Location: Right Arm, Patient Position: Sitting, Cuff Size: Normal)   Pulse 80   Temp 97.9 F (36.6 C) (Oral)   Resp 16   Ht 5\' 4"  (1.626 m)   Wt 175 lb 12.8 oz (79.7 kg)   SpO2 97%   BMI 30.18 kg/m   Wt Readings from Last 3 Encounters:  07/15/18 175 lb 12.8 oz (79.7 kg)  06/14/18 179 lb (81.2 kg)  06/08/18 178 lb (80.7 kg)   Repeat BP 148/72 Physical Exam  General appearance - alert, well appearing, and in no distress Mental status - normal mood, behavior, speech, dress, motor activity, and thought processes Neck - supple, no significant adenopathy Chest - clear to auscultation, no wheezes, rales or  rhonchi, symmetric air entry Heart - normal rate, regular rhythm, normal S1, S2, no murmurs, rubs, clicks or gallops Extremities - peripheral pulses normal, no pedal edema, no clubbing or cyanosis Diabetic Foot Exam - Simple   Simple Foot Form Visual Inspection No deformities, no ulcerations, no other skin breakdown bilaterally:  Yes Sensation Testing Intact to touch and monofilament testing bilaterally:  Yes Pulse Check Posterior Tibialis and Dorsalis pulse intact bilaterally:  Yes Comments     Results for orders placed or performed in visit on 07/15/18  POCT glucose (manual entry)  Result Value Ref Range   POC Glucose 108 (A) 70 - 99 mg/dl  POCT glycosylated hemoglobin (Hb A1C)  Result Value Ref Range   Hemoglobin A1C 6.3 (A) 4.0 - 5.6 %   HbA1c POC (<> result, manual entry)     HbA1c, POC (prediabetic range)     HbA1c, POC (controlled diabetic range)      ASSESSMENT AND PLAN: 1. Diabetes mellitus type 2 with complications (HCC) Her Y7X is at goal but I do not know how accurate this is given her kidney function.  I advised patient that blood sugar monitoring would be a more accurate way to evaluate control of her diabetes.  Advised to bring in her blood sugar log at each visit.  She will stay off the NovoLog for now since blood sugars bottom out when she takes it.  At some point we will probably also need to get her off of glipizide given her kidney function.  Patient advised to let me know if she starts having any low blood sugar episodes - POCT glucose (manual entry) - POCT glycosylated hemoglobin (Hb A1C) - Microalbumin /  creatinine urine ratio  2. Dyslipidemia - atorvastatin (LIPITOR) 20 MG tablet; Take 0.5 tablets (10 mg total) by mouth daily.  Dispense: 15 tablet; Refill: 6  3. Essential hypertension Not at goal of 130/80 but acceptable for her.  Continue to limit salt in the foods.  4. ESRD (end stage renal disease) ([place]) Followed by nephrology.  Patient was given  the opportunity to ask questions.  Patient verbalized understanding of the plan and was able to repeat key elements of the plan.   Orders Placed This Encounter  Procedures  . Microalbumin / creatinine urine ratio  . POCT glucose (manual entry)  . POCT glycosylated hemoglobin (Hb A1C)     Requested Prescriptions   Signed Prescriptions Disp Refills  . atorvastatin (LIPITOR) 20 MG tablet 15 tablet 6    Sig: Take 0.5 tablets (10 mg total) by mouth daily.    Return in about 3 months (around 10/15/2018).  Karle Plumber, MD, FACP

## 2018-07-16 LAB — MICROALBUMIN / CREATININE URINE RATIO
Creatinine, Urine: 94 mg/dL
Microalb/Creat Ratio: 1762.3 mg/g creat — ABNORMAL HIGH (ref 0.0–30.0)
Microalbumin, Urine: 1656.6 ug/mL

## 2018-07-18 ENCOUNTER — Telehealth: Payer: Self-pay | Admitting: Internal Medicine

## 2018-07-18 NOTE — Telephone Encounter (Signed)
Patient called to give the kidney specialist number which is [phone]. Please follow up with patient.

## 2018-07-19 ENCOUNTER — Ambulatory Visit (HOSPITAL_COMMUNITY)
Admission: RE | Admit: 2018-07-19 | Discharge: 2018-07-19 | Disposition: A | Discharge status: Home / Self Care | Payer: Medicaid Other | Source: Ambulatory Visit | Attending: Nephrology | Admitting: Nephrology

## 2018-07-19 VITALS — BP 149/72 | HR 81 | Temp 98.2°F | Resp 20

## 2018-07-19 DIAGNOSIS — D631 Anemia in chronic kidney disease: Secondary | ICD-10-CM | POA: Insufficient documentation

## 2018-07-19 DIAGNOSIS — N184 Chronic kidney disease, stage 4 (severe): Secondary | ICD-10-CM | POA: Insufficient documentation

## 2018-07-19 LAB — RENAL FUNCTION PANEL
ANION GAP: 12 (ref 5–15)
Albumin: 2.8 g/dL — ABNORMAL LOW (ref 3.5–5.0)
BUN: 83 mg/dL — ABNORMAL HIGH (ref 6–20)
CALCIUM: 7.9 mg/dL — AB (ref 8.9–10.3)
CO2: 20 mmol/L — ABNORMAL LOW (ref 22–32)
Chloride: 107 mmol/L (ref 98–111)
Creatinine, Ser: 6.05 mg/dL — ABNORMAL HIGH (ref 0.44–1.00)
GFR calc Af Amer: 8 mL/min — ABNORMAL LOW (ref >60)
GFR calc non Af Amer: 7 mL/min — ABNORMAL LOW (ref >60)
Glucose, Bld: 74 mg/dL (ref 70–99)
Phosphorus: 7.4 mg/dL — ABNORMAL HIGH (ref 2.5–4.6)
Potassium: 4.2 mmol/L (ref 3.5–5.1)
SODIUM: 139 mmol/L (ref 135–145)

## 2018-07-19 LAB — POCT HEMOGLOBIN-HEMACUE: Hemoglobin: 10.6 g/dL — ABNORMAL LOW (ref 12.0–15.0)

## 2018-07-19 LAB — IRON AND TIBC
IRON: 94 ug/dL (ref 28–170)
SATURATION RATIOS: 36 % — AB (ref 10.4–31.8)
TIBC: 260 ug/dL (ref 250–450)
UIBC: 166 ug/dL

## 2018-07-19 LAB — FERRITIN: Ferritin: 292 ng/mL (ref 11–307)

## 2018-07-19 MED ORDER — DARBEPOETIN ALFA 200 MCG/0.4ML IJ SOSY
200.0000 ug | PREFILLED_SYRINGE | INTRAMUSCULAR | Status: DC
  Administered 2018-07-19: 200 ug via SUBCUTANEOUS

## 2018-07-19 MED ORDER — DARBEPOETIN ALFA 200 MCG/0.4ML IJ SOSY
PREFILLED_SYRINGE | INTRAMUSCULAR | Status: AC
  Filled 2018-07-19: qty 0.4

## 2018-07-19 NOTE — Telephone Encounter (Signed)
-----   Message from Ladell Pier, MD sent at 07/17/2018  9:48 AM EDT ----- Let pt know that she has a large amount of protein in the urine compared to one year ago.  Due to diabetes affecting the kidneys.  Will send results to her kidney specialist.  Please get the name of her nephrologist at [facility] and fax this lab result to him.

## 2018-07-19 NOTE — Telephone Encounter (Signed)
CMA informed patient on results.  Patient verified DOB. Patient understood.  Patient's lab result was faxed over to [facility].

## 2018-08-08 ENCOUNTER — Other Ambulatory Visit: Payer: Self-pay | Admitting: Internal Medicine

## 2018-08-08 DIAGNOSIS — E1165 Type 2 diabetes mellitus with hyperglycemia: Secondary | ICD-10-CM

## 2018-08-08 DIAGNOSIS — Z794 Long term (current) use of insulin: Principal | ICD-10-CM

## 2018-08-08 MED FILL — ISOSORBIDE MN ER 30 MG TAB: 30 | 30 days supply | Qty: 15 | Fill #2

## 2018-08-08 MED FILL — ESCITALOPRAM 20 MG TABLET: 20 | 30 days supply | Qty: 30 | Fill #2

## 2018-08-08 MED FILL — LANTUS SOLOSTAR 100 UNITS/M: 100 | 27 days supply | Qty: 15 | Fill #0

## 2018-08-08 MED FILL — glipiZIDE 10 MG TABS: 10 | 30 days supply | Qty: 60 | Fill #0

## 2018-08-08 MED FILL — ATORVASTATIN 20 MG TABLET: 20 | 30 days supply | Qty: 15 | Fill #0

## 2018-08-08 MED FILL — GABAPENTIN 100 MG CAPSULE: 100 | 30 days supply | Qty: 30 | Fill #3

## 2018-08-09 ENCOUNTER — Ambulatory Visit (HOSPITAL_COMMUNITY)
Admission: RE | Admit: 2018-08-09 | Discharge: 2018-08-09 | Disposition: A | Discharge status: Home / Self Care | Payer: Medicaid Other | Source: Ambulatory Visit | Attending: Nephrology | Admitting: Nephrology

## 2018-08-09 VITALS — BP 155/74 | HR 80 | Temp 98.0°F | Resp 20

## 2018-08-09 DIAGNOSIS — N184 Chronic kidney disease, stage 4 (severe): Secondary | ICD-10-CM | POA: Diagnosis not present

## 2018-08-09 DIAGNOSIS — D631 Anemia in chronic kidney disease: Secondary | ICD-10-CM | POA: Insufficient documentation

## 2018-08-09 LAB — POCT HEMOGLOBIN-HEMACUE: HEMOGLOBIN: 10.3 g/dL — AB (ref 12.0–15.0)

## 2018-08-09 MED ORDER — DARBEPOETIN ALFA 200 MCG/0.4ML IJ SOSY
PREFILLED_SYRINGE | INTRAMUSCULAR | Status: AC
  Filled 2018-08-09: qty 0.4

## 2018-08-09 MED ORDER — DARBEPOETIN ALFA 200 MCG/0.4ML IJ SOSY
200.0000 ug | PREFILLED_SYRINGE | INTRAMUSCULAR | Status: DC
  Administered 2018-08-09: 200 ug via SUBCUTANEOUS

## 2018-08-30 ENCOUNTER — Ambulatory Visit (HOSPITAL_COMMUNITY)
Admission: RE | Admit: 2018-08-30 | Discharge: 2018-08-30 | Disposition: A | Discharge status: Home / Self Care | Payer: Medicaid Other | Source: Ambulatory Visit | Attending: Nephrology | Admitting: Nephrology

## 2018-08-30 VITALS — BP 136/67 | HR 77 | Temp 98.0°F | Resp 20

## 2018-08-30 DIAGNOSIS — N184 Chronic kidney disease, stage 4 (severe): Secondary | ICD-10-CM | POA: Diagnosis not present

## 2018-08-30 LAB — RENAL FUNCTION PANEL
ALBUMIN: 2.7 g/dL — AB (ref 3.5–5.0)
ANION GAP: 8 (ref 5–15)
BUN: 87 mg/dL — ABNORMAL HIGH (ref 6–20)
CO2: 18 mmol/L — AB (ref 22–32)
Calcium: 7.9 mg/dL — ABNORMAL LOW (ref 8.9–10.3)
Chloride: 112 mmol/L — ABNORMAL HIGH (ref 98–111)
Creatinine, Ser: 6.74 mg/dL — ABNORMAL HIGH (ref 0.44–1.00)
GFR calc non Af Amer: 6 mL/min — ABNORMAL LOW (ref >60)
GFR, EST AFRICAN AMERICAN: 7 mL/min — AB (ref >60)
GLUCOSE: 49 mg/dL — AB (ref 70–99)
PHOSPHORUS: 8.2 mg/dL — AB (ref 2.5–4.6)
POTASSIUM: 4.9 mmol/L (ref 3.5–5.1)
Sodium: 138 mmol/L (ref 135–145)

## 2018-08-30 LAB — FERRITIN: Ferritin: 168 ng/mL (ref 11–307)

## 2018-08-30 LAB — IRON AND TIBC
IRON: 78 ug/dL (ref 28–170)
Saturation Ratios: 29 % (ref 10.4–31.8)
TIBC: 273 ug/dL (ref 250–450)
UIBC: 195 ug/dL

## 2018-08-30 MED ORDER — DARBEPOETIN ALFA 200 MCG/0.4ML IJ SOSY
200.0000 ug | PREFILLED_SYRINGE | INTRAMUSCULAR | Status: DC
  Administered 2018-08-30: 200 ug via SUBCUTANEOUS

## 2018-08-30 MED ORDER — DARBEPOETIN ALFA 200 MCG/0.4ML IJ SOSY
PREFILLED_SYRINGE | INTRAMUSCULAR | Status: AC
  Filled 2018-08-30: qty 0.4

## 2018-08-31 LAB — PTH, INTACT AND CALCIUM
CALCIUM TOTAL (PTH): 8 mg/dL — AB (ref 8.7–10.2)
PTH: 121 pg/mL — AB (ref 15–65)

## 2018-08-31 LAB — POCT HEMOGLOBIN-HEMACUE: Hemoglobin: 9.5 g/dL — ABNORMAL LOW (ref 12.0–15.0)

## 2018-09-14 MED FILL — LANTUS SOLOSTAR 100 UNITS/M: 100 | 27 days supply | Qty: 15 | Fill #1

## 2018-09-14 MED FILL — ATORVASTATIN 20 MG TABLET: 20 | 30 days supply | Qty: 15 | Fill #1

## 2018-09-14 MED FILL — ISOSORBIDE MN ER 30 MG TAB: 30 | 30 days supply | Qty: 15 | Fill #3

## 2018-09-14 MED FILL — glipiZIDE 10 MG TABS: 10 | 30 days supply | Qty: 60 | Fill #1

## 2018-09-15 ENCOUNTER — Ambulatory Visit: Payer: Medicaid Other | Attending: Family Medicine | Admitting: Family Medicine

## 2018-09-15 DIAGNOSIS — M25552 Pain in left hip: Secondary | ICD-10-CM

## 2018-09-15 DIAGNOSIS — Z833 Family history of diabetes mellitus: Secondary | ICD-10-CM | POA: Insufficient documentation

## 2018-09-15 DIAGNOSIS — Z79899 Other long term (current) drug therapy: Secondary | ICD-10-CM | POA: Diagnosis not present

## 2018-09-15 DIAGNOSIS — N184 Chronic kidney disease, stage 4 (severe): Secondary | ICD-10-CM | POA: Insufficient documentation

## 2018-09-15 DIAGNOSIS — I130 Hypertensive heart and chronic kidney disease with heart failure and stage 1 through stage 4 chronic kidney disease, or unspecified chronic kidney disease: Secondary | ICD-10-CM | POA: Diagnosis not present

## 2018-09-15 DIAGNOSIS — E1122 Type 2 diabetes mellitus with diabetic chronic kidney disease: Secondary | ICD-10-CM | POA: Insufficient documentation

## 2018-09-15 DIAGNOSIS — W010XXA Fall on same level from slipping, tripping and stumbling without subsequent striking against object, initial encounter: Secondary | ICD-10-CM | POA: Diagnosis not present

## 2018-09-15 DIAGNOSIS — M5442 Lumbago with sciatica, left side: Secondary | ICD-10-CM

## 2018-09-15 DIAGNOSIS — Z90710 Acquired absence of both cervix and uterus: Secondary | ICD-10-CM | POA: Diagnosis not present

## 2018-09-15 DIAGNOSIS — Z794 Long term (current) use of insulin: Secondary | ICD-10-CM | POA: Diagnosis not present

## 2018-09-15 DIAGNOSIS — I509 Heart failure, unspecified: Secondary | ICD-10-CM | POA: Diagnosis not present

## 2018-09-15 DIAGNOSIS — W19XXXA Unspecified fall, initial encounter: Secondary | ICD-10-CM | POA: Diagnosis not present

## 2018-09-15 DIAGNOSIS — F419 Anxiety disorder, unspecified: Secondary | ICD-10-CM | POA: Diagnosis not present

## 2018-09-15 DIAGNOSIS — Z9889 Other specified postprocedural states: Secondary | ICD-10-CM | POA: Insufficient documentation

## 2018-09-15 DIAGNOSIS — F329 Major depressive disorder, single episode, unspecified: Secondary | ICD-10-CM | POA: Diagnosis not present

## 2018-09-15 DIAGNOSIS — Z882 Allergy status to sulfonamides status: Secondary | ICD-10-CM | POA: Insufficient documentation

## 2018-09-15 DIAGNOSIS — Z7982 Long term (current) use of aspirin: Secondary | ICD-10-CM | POA: Diagnosis not present

## 2018-09-15 MED ORDER — PREDNISONE 20 MG PO TABS: ORAL_TABLET | ORAL | 0 refills | Status: DC

## 2018-09-15 NOTE — Progress Notes (Signed)
Subjective:    Patient ID: Patricia [name], female    DOB: [birth year], 62 y.o.   MRN: [id number]  HPI       62 yo female with multiple medical issues including insulin requiring diabetes, stage IV chronic renal disease and CHF seen secondary to the complaint of left-sided low back pain and left hip pain with radiation down the left leg to the knee status post a fall at home last Wednesday before Thanksgiving.  Patient states that she accidentally stepped on the lid of a trash can causing her to do a split and fell onto her left side on the kitchen floor.  Since that time, patient has had low back pain with radiation down the left leg to the knee.  Patient describes the back and hip pain is sharp and pain is been a 10/10.  Patient has been taken Tylenol to try and decrease the pain as she cannot take nonsteroidal anti-inflammatories due to her chronic kidney disease.  Patient has increased pain with walking, as well as attempts to bend over and put on her close.  Patient with increased pain with bearing weight on the left side which has caused her to have an abnormal gait.  Patient believes that she has had some issues with low back pain in the past but has not had similar symptoms to her current pain.  Past Medical History:  Diagnosis Date  . Allergy   . Anemia   . Anxiety   . Blood transfusion without reported diagnosis   . Cardiomyopathy ([place]) 11/2016   no ischemic eval due to CKD  . CHF (congestive heart failure) ([place])   . CKD (chronic kidney disease), stage IV ([place])   . Depression   . Diabetes mellitus without complication ([place])    type 2  . Dyspnea   . Hypertension   . Obesity    Past Surgical History:  Procedure Laterality Date  . ABDOMINAL HYSTERECTOMY    . BREAST SURGERY     reduction  . [place]  . LIPOMA EXCISION     back  Dr. Excell Seltzer 03-22-18  . LIPOMA EXCISION N/A 03/22/2018   Procedure: EXCISION OF BACK LIPOMA;  Surgeon: Excell Seltzer, MD;   Location: [facility];  Service: General;  Laterality: N/A;  . REDUCTION MAMMAPLASTY Bilateral    Family History  Problem Relation Age of Onset  . Diabetes Brother   . Stomach cancer Brother   . Kidney disease Brother   . Heart Problems Maternal Grandmother        pacemaker  . Heart disease Maternal Grandfather   . Rectal cancer Neg Hx   . Esophageal cancer Neg Hx   . Liver cancer Neg Hx   . Colon cancer Neg Hx    Social History   Tobacco Use  . Smoking status: Never Smoker  . Smokeless tobacco: Never Used  Substance Use Topics  . Alcohol use: No  . Drug use: No   Allergies  Allergen Reactions  . Codone [Hydrocodone] Itching  . Norvasc [Amlodipine Besylate] Swelling    Lower extremity?  . Sulfa Antibiotics Itching and Rash   Current Outpatient Medications on File Prior to Visit  Medication Sig Dispense Refill  . acetaminophen (TYLENOL) 500 MG tablet Take 500-1,000 mg by mouth every 6 (six) hours as needed (for pain or headaches).    Marland Kitchen aspirin EC 81 MG tablet Take 1 tablet (81 mg total) by mouth daily. 90 tablet 3  .  atorvastatin (LIPITOR) 20 MG tablet Take 0.5 tablets (10 mg total) by mouth daily. 15 tablet 6  . Bee Pollen 550 MG CAPS Take 550 mg by mouth daily as needed (to boost the immune system).     . bismuth subsalicylate (PEPTO BISMOL) 262 MG/15ML suspension Take 30 mLs by mouth every 6 (six) hours as needed for indigestion.     . calcitRIOL (ROCALTROL) 0.25 MCG capsule Take 0.25 mcg by mouth daily.   11  . calcium carbonate (TUMS - DOSED IN MG ELEMENTAL CALCIUM) 500 MG chewable tablet Chew 1 tablet by mouth 3 (three) times daily as needed for indigestion or heartburn.     . carvedilol (COREG) 25 MG tablet TAKE 1 TABLET BY MOUTH TWICE DAILY WITH A MEAL 60 tablet 11  . escitalopram (LEXAPRO) 20 MG tablet TAKE 1 TABLET BY MOUTH AT BEDTIME. 30 tablet 2  . fluticasone (FLONASE) 50 MCG/ACT nasal spray Place 1 spray into both nostrils daily. 16 g 0  . furosemide (LASIX) 40 MG  tablet TAKE 1 TABLET BY MOUTH 2 TIMES DAILY. (Patient taking differently: Take 80 mg by mouth daily. ) 60 tablet 1  . gabapentin (NEURONTIN) 100 MG capsule Take 1 capsule (100 mg total) by mouth at bedtime. 90 capsule 3  . Garlic 485 MG TABS Take 100 mg by mouth daily.     Marland Kitchen glipiZIDE (GLUCOTROL) 10 MG tablet TAKE 1 TABLET BY MOUTH 2 TIMES DAILY BEFORE A MEAL. 60 tablet 2  . glucose 4 GM chewable tablet Chew 1 tablet by mouth as needed for low blood sugar.    . hydrALAZINE (APRESOLINE) 50 MG tablet Take 1 tablet (50 mg total) by mouth 2 (two) times daily. 60 tablet 2  . insulin aspart (NOVOLOG) 100 UNIT/ML injection 3 units before meals. (Patient taking differently: Inject 5 Units into the skin 3 (three) times daily as needed (before meals for high blood sugar levels). ) 60 mL 3  . Insulin Pen Needle (TRUEPLUS PEN NEEDLES) 32G X 4 MM MISC Use as directed to inject insulin. 100 each 3  . Insulin Pen Needle 31G X 5 MM MISC Use as directed 100 each 12  . isosorbide mononitrate (IMDUR) 30 MG 24 hr tablet Take 0.5 tablets (15 mg total) by mouth daily. 30 tablet 1  . LANTUS SOLOSTAR 100 UNIT/ML Solostar Pen INJECT 27 UNITS INTO THE SKIN 2 (TWO) TIMES DAILY. 15 mL 2   No current facility-administered medications on file prior to visit.     Review of Systems  Constitutional: Positive for fatigue. Negative for chills and fever.  Respiratory: Negative for cough and shortness of breath.   Cardiovascular: Negative for chest pain, palpitations and leg swelling.  Gastrointestinal: Negative for abdominal pain and nausea.  Genitourinary: Negative for dysuria and frequency.  Musculoskeletal: Positive for back pain and gait problem.  Neurological: Positive for numbness. Negative for dizziness and headaches.       Objective:   Physical Exam  VS: the epic system was down at the beginning of the examination General-well-nourished, well-developed older female in no acute distress while sitting on her chair but  when patient attempts to get up from a seated position she has difficulty doing so and has increase in pain.  Patient also with an abnormal gait with ambulation. Lungs-clear to auscultation bilaterally Cardiovascular-regular rate and rhythm Abdomen-soft, nontender Back-no CVA tenderness, patient with tenderness to palpation over the lower lumbar spine from approximately L3-S1 as well as at the left SI joint.  Patient with positive seated left leg raise. Musculoskeletal- patient with mild discomfort over the greater trochanter of the left hip.  Patient with discomfort with hip flexion and mild discomfort with external hip rotation but minimal discomfort with internal hip rotation       Assessment & Plan:  1. Acute hip pain, left Patient with acute left hip pain status post fall.  Patient is hip pain is minimal and I suspect the most of her pain is secondary to lumbar radiculopathy however will have patient obtain x-rays of the left hip to look for any possibility of fracture or other hip abnormality.  Patient is being referred to orthopedics for further evaluation and treatment.  Patient may continue the use of Tylenol to help with pain as patient is allergic to codeine and cannot take nonsteroidal anti-inflammatories.  Patient states that she generally does not take stronger medication. - DG HIP INFANT UNILAT WITH PELVIS 1V LEFT; Future - Ambulatory referral to Orthopedic Surgery  2. Acute left-sided low back pain with left-sided sciatica Patient with acute left-sided low back pain with radiation status post fall with hyperextension type injury after stepping on a trash can lid which caused her to do a "split" as she was falling.  Discussed with the patient and her daughter that her symptoms are most compatible with lumbar radiculopathy.  Patient will be placed on prednisone taper and patient was made aware that this may cause an increase in her blood sugar levels as patient is diabetic and patient  may need to adjust her insulin levels accordingly.  Lumbar spine films ordered but patient is made aware that if she is contacted by orthopedics before getting the x-rays that she should go ahead and go to the orthopedic appointment as they will be able to do her x-rays at the orthopedic office. - DG Lumbar Spine Complete; Future - Ambulatory referral to Orthopedic Surgery - predniSONE (DELTASONE) 20 MG tablet; Take 2 pills today and tomorrow then 1 pill x 2 days then 1/2 pill x 4 days  Dispense: 8 tablet; Refill: 0  3. Fall, initial encounter Patient status post fall at home.  Patient states that her daughter had taken the lid off of the trash can in order to empty the trash can and patient did not see the lid of the trash can and stepped onto the upturned lid causing her to slip and fall.  Patient is aware fall precautions. - DG Lumbar Spine Complete; Future - DG HIP INFANT UNILAT WITH PELVIS 1V LEFT; Future - Ambulatory referral to Orthopedic Surgery  An After Visit Summary was printed and given to the patient.  Return if symptoms worsen or fail to improve, for hip/back pain-Orthopedics; keep any scheduled f/u here.

## 2018-09-15 NOTE — Patient Instructions (Signed)
Sciatica Sciatica is pain, numbness, weakness, or tingling along your sciatic nerve. The sciatic nerve starts in the lower back and goes down the back of each leg. Sciatica happens when this nerve is pinched or has pressure put on it. Sciatica usually goes away on its own or with treatment. Sometimes, sciatica may keep coming back (recur). Follow these instructions at home: Medicines  Take over-the-counter and prescription medicines only as told by your doctor.  Do not drive or use heavy machinery while taking prescription pain medicine. Managing pain  If directed, put ice on the affected area. ? Put ice in a plastic bag. ? Place a towel between your skin and the bag. ? Leave the ice on for 20 minutes, 2-3 times a day.  After icing, apply heat to the affected area before you exercise or as often as told by your doctor. Use the heat source that your doctor tells you to use, such as a moist heat pack or a heating pad. ? Place a towel between your skin and the heat source. ? Leave the heat on for 20-30 minutes. ? Remove the heat if your skin turns bright red. This is especially important if you are unable to feel pain, heat, or cold. You may have a greater risk of getting burned. Activity  Return to your normal activities as told by your doctor. Ask your doctor what activities are safe for you. ? Avoid activities that make your sciatica worse.  Take short rests during the day. Rest in a lying or standing position. This is usually better than sitting to rest. ? When you rest for a long time, do some physical activity or stretching between periods of rest. ? Avoid sitting for a long time without moving. Get up and move around at least one time each hour.  Exercise and stretch regularly, as told by your doctor.  Do not lift anything that is heavier than 10 lb (4.5 kg) while you have symptoms of sciatica. ? Avoid lifting heavy things even when you do not have symptoms. ? Avoid lifting heavy  things over and over.  When you lift objects, always lift in a way that is safe for your body. To do this, you should: ? Bend your knees. ? Keep the object close to your body. ? Avoid twisting. General instructions  Use good posture. ? Avoid leaning forward when you are sitting. ? Avoid hunching over when you are standing.  Stay at a healthy weight.  Wear comfortable shoes that support your feet. Avoid wearing high heels.  Avoid sleeping on a mattress that is too soft or too hard. You might have less pain if you sleep on a mattress that is firm enough to support your back.  Keep all follow-up visits as told by your doctor. This is important. Contact a doctor if:  You have pain that: ? Wakes you up when you are sleeping. ? Gets worse when you lie down. ? Is worse than the pain you have had in the past. ? Lasts longer than 4 weeks.  You lose weight for without trying. Get help right away if:  You cannot control when you pee (urinate) or poop (have a bowel movement).  You have weakness in any of these areas and it gets worse. ? Lower back. ? Lower belly (pelvis). ? Butt (buttocks). ? Legs.  You have redness or swelling of your back.  You have a burning feeling when you pee. This information is not intended to replace   advice given to you by your health care provider. Make sure you discuss any questions you have with your health care provider. Document Released: 07/07/2008 Document Revised: 03/05/2016 Document Reviewed: 06/07/2015 Elsevier Interactive Patient Education  2018 [employer]  

## 2018-09-16 ENCOUNTER — Other Ambulatory Visit: Payer: Self-pay

## 2018-09-16 ENCOUNTER — Other Ambulatory Visit: Payer: Self-pay | Admitting: Internal Medicine

## 2018-09-16 DIAGNOSIS — E1165 Type 2 diabetes mellitus with hyperglycemia: Secondary | ICD-10-CM

## 2018-09-16 DIAGNOSIS — Z794 Long term (current) use of insulin: Principal | ICD-10-CM

## 2018-09-16 DIAGNOSIS — I10 Essential (primary) hypertension: Secondary | ICD-10-CM

## 2018-09-16 MED ORDER — HYDRALAZINE HCL 50 MG PO TABS: 50.0000 mg | ORAL_TABLET | Freq: Two times a day (BID) | ORAL | 2 refills | Status: DC

## 2018-09-16 MED FILL — hydrALAZINE HCL 50 MG TABS: 50 | 30 days supply | Qty: 60 | Fill #0

## 2018-09-16 NOTE — Telephone Encounter (Signed)
Patricia [name] is this something you can do

## 2018-09-16 NOTE — Telephone Encounter (Signed)
Patients daughter called to request an order for  -freestyle libre reader  -dexcom cgms, She also wants a call back and speak with a nurse about her Diabetes, Please follow up

## 2018-09-19 NOTE — Telephone Encounter (Signed)
Pt's daughter returned my call, I explained that at the moment her mom is not a good candidate for a CGM, but that I would send in an RX for new meter, strips and lancets. She also mentioned that the patient has been experiencing frequent hypoglycemic events, they reported that when this happened initially several months ago they were advised to stop using the Novolog which Patricia [name] has not used since then. Additionally I asked if she was still taking her lantus twice daily and Patricia [name] reported that she was only using it once daily due to hypoglycemia. When I asked the pt's daughter what her blood sugar was during these times she never gave any particular numbers but she said "Most of the time I can get her sugar back up, but I have had to call the paramedics recently"  I told the patients daughter that I would inform her mothers provider and have them reach out with recommendations and it is possible that we may want to see her mother in office before her scheduled January appt. Please advise them on how to proceed.    I attached the right meter for her ins, please advise on the testing frequency since pt reports frequent low's.

## 2018-09-19 NOTE — Telephone Encounter (Signed)
We have not been successful in getting any continuous glucose monitor approved for medicaid patients, they have very stringent requirements for Type 2 Diabetics. The patients that we've had that were good candidates for these devices were referred to Endocrinology and they were able to get those devices approved when clinically necessary. From my brief review of her chart I don't think that she is a good candidate and it also doesn't look like it would be appropriate to send her to Endo.   Usual Requirements for Type 2 Diabetics: -Pt has diabetes mellitus -Pt has been using a blood glucose meter and performing frequent testing(4 or more times per day) -Pt is insulin treated with multiple daily injections daily(3 or more)  -Pt's insulin regimen requires frequent adjustment(sliding scale)  We should send in a meter and supplies because the patient hasn't filled testing supplies since 01/04/17. She could be testing up to three times daily depending on her doctors recommendation.   I attempted to call the patients daughter who made the request but there was no answer, I left a brief VM asking her to return my call to my direct line [phone]

## 2018-09-20 ENCOUNTER — Other Ambulatory Visit: Payer: Self-pay | Admitting: Pharmacist

## 2018-09-20 ENCOUNTER — Ambulatory Visit (HOSPITAL_COMMUNITY)
Admission: RE | Admit: 2018-09-20 | Discharge: 2018-09-20 | Disposition: A | Discharge status: Home / Self Care | Payer: Medicaid Other | Source: Ambulatory Visit | Attending: Nephrology | Admitting: Nephrology

## 2018-09-20 VITALS — BP 170/84 | HR 81 | Temp 98.2°F | Resp 20

## 2018-09-20 DIAGNOSIS — D631 Anemia in chronic kidney disease: Secondary | ICD-10-CM | POA: Insufficient documentation

## 2018-09-20 DIAGNOSIS — Z794 Long term (current) use of insulin: Principal | ICD-10-CM

## 2018-09-20 DIAGNOSIS — N184 Chronic kidney disease, stage 4 (severe): Secondary | ICD-10-CM | POA: Diagnosis present

## 2018-09-20 DIAGNOSIS — E1165 Type 2 diabetes mellitus with hyperglycemia: Secondary | ICD-10-CM

## 2018-09-20 LAB — POCT HEMOGLOBIN-HEMACUE: HEMOGLOBIN: 9.9 g/dL — AB (ref 12.0–15.0)

## 2018-09-20 MED ORDER — ACCU-CHEK AVIVA PLUS W/DEVICE KIT: PACK | 0 refills | Status: DC

## 2018-09-20 MED ORDER — GLIPIZIDE 10 MG PO TABS: 10.0000 mg | ORAL_TABLET | Freq: Every day | ORAL | 2 refills | Status: DC

## 2018-09-20 MED ORDER — DARBEPOETIN ALFA 200 MCG/0.4ML IJ SOSY
200.0000 ug | PREFILLED_SYRINGE | INTRAMUSCULAR | Status: DC
  Administered 2018-09-20: 200 ug via SUBCUTANEOUS

## 2018-09-20 MED ORDER — DARBEPOETIN ALFA 200 MCG/0.4ML IJ SOSY
PREFILLED_SYRINGE | INTRAMUSCULAR | Status: AC
  Administered 2018-09-20: 200 ug via SUBCUTANEOUS
  Filled 2018-09-20: qty 0.4

## 2018-09-20 MED ORDER — GLUCOSE BLOOD VI STRP: ORAL_STRIP | 12 refills | Status: DC

## 2018-09-20 MED ORDER — INSULIN GLARGINE 100 UNIT/ML SOLOSTAR PEN: 24.0000 [IU] | PEN_INJECTOR | Freq: Every day | SUBCUTANEOUS | 2 refills | Status: DC

## 2018-09-20 MED ORDER — ACCU-CHEK FASTCLIX LANCETS MISC: 12 refills | Status: DC

## 2018-09-20 NOTE — Telephone Encounter (Signed)
Phone call placed to patient today.  Patient reports that morning blood sugars have been in the 50s over the past week.  She is not taking the NovoLog.  She has decrease Lantus from 27 units twice a day to just 27 units at bedtime.  I advised patient to decrease glipizide from 10 units twice a day to just once a day and decrease Lantus to 24 units at bedtime.  She was told to call me back if blood sugars continue to run low.

## 2018-09-21 ENCOUNTER — Ambulatory Visit (INDEPENDENT_AMBULATORY_CARE_PROVIDER_SITE_OTHER): Payer: Self-pay | Admitting: Orthopaedic Surgery

## 2018-09-23 ENCOUNTER — Ambulatory Visit (INDEPENDENT_AMBULATORY_CARE_PROVIDER_SITE_OTHER): Payer: Medicaid Other | Admitting: Orthopaedic Surgery

## 2018-09-23 ENCOUNTER — Ambulatory Visit (INDEPENDENT_AMBULATORY_CARE_PROVIDER_SITE_OTHER): Payer: Medicaid Other

## 2018-09-23 ENCOUNTER — Encounter (INDEPENDENT_AMBULATORY_CARE_PROVIDER_SITE_OTHER): Payer: Self-pay | Admitting: Orthopaedic Surgery

## 2018-09-23 DIAGNOSIS — M25552 Pain in left hip: Secondary | ICD-10-CM

## 2018-09-23 DIAGNOSIS — G8929 Other chronic pain: Secondary | ICD-10-CM

## 2018-09-23 DIAGNOSIS — M545 Low back pain, unspecified: Secondary | ICD-10-CM

## 2018-09-23 NOTE — Progress Notes (Signed)
Office Visit Note   Patient: Patricia [name]           Date of Birth: [birth year]           MRN: [id number] Visit Date: 09/23/2018              Requested by: Antony Blackbird, MD [place], [place] [address] PCP: Ladell Pier, MD   Assessment & Plan: Visit Diagnoses:  1. Pain in left hip   2. Chronic low back pain, unspecified back pain laterality, unspecified whether sciatica present     Plan: Impression is lumbar radiculopathy and left trochanteric bursitis possible IT band syndrome.  I did offer cortisone injection for the bursitis but she declined.  She would like to try giving this more time and taking Tylenol as needed.  I have given her a referral to outpatient physical therapy.  Questions encouraged and answered.  Follow-up as needed.  Follow-Up Instructions: Return if symptoms worsen or fail to improve.   Orders:  Orders Placed This Encounter  Procedures  . XR HIP UNILAT W OR W/O PELVIS 2-3 VIEWS LEFT  . XR Lumbar Spine 2-3 Views   No orders of the defined types were placed in this encounter.     Procedures: No procedures performed   Clinical Data: No additional findings.   Subjective: Chief Complaint  Patient presents with  . Lower Back - Pain  . Left Hip - Pain    [name] is a 62 year old female comes in with left hip and low back pain that radiates across the lateral aspect of the hip and into the groin and thigh region.  This originally started when she fell on 09/07/2018.  She feels a burning soreness pain.  Overall she feels that she is slightly improved.  She was placed on a prednisone Dosepak which has helped but it did make her gain weight.  She has been taking Tylenol otherwise.  She denies any numbness and tingling.   Review of Systems  Constitutional: Negative.   HENT: Negative.   Eyes: Negative.   Respiratory: Negative.   Cardiovascular: Negative.   Endocrine: Negative.   Musculoskeletal: Negative.   Neurological: Negative.    Hematological: Negative.   Psychiatric/Behavioral: Negative.   All other systems reviewed and are negative.    Objective: Vital Signs: There were no vitals taken for this visit.  Physical Exam Vitals signs and nursing note reviewed.  Constitutional:      Appearance: She is well-developed.  HENT:     Head: Normocephalic and atraumatic.  Neck:     Musculoskeletal: Neck supple.  Pulmonary:     Effort: Pulmonary effort is normal.  Abdominal:     Palpations: Abdomen is soft.  Skin:    General: Skin is warm.     Capillary Refill: Capillary refill takes less than 2 seconds.  Neurological:     Mental Status: She is alert and oriented to person, place, and time.  Psychiatric:        Behavior: Behavior normal.        Thought Content: Thought content normal.        Judgment: Judgment normal.     Ortho Exam Left hip and low back exam shows mild tenderness of the trochanteric bursa and the lumbar spine.  Negative Faber.  No pain with hip rotation. Specialty Comments:  No specialty comments available.  Imaging: Xr Hip Unilat W Or W/o Pelvis 2-3 Views Left  Result Date: 09/23/2018 No evidence of  significant degenerative joint disease.  Xr Lumbar Spine 2-3 Views  Result Date: 09/23/2018 Grade 1 L4-L5 anterolisthesis.  Overall disc spaces are well maintained.    PMFS History: Patient Active Problem List   Diagnosis Date Noted  . Chronic combined systolic and diastolic heart failure ([place]) 06/14/2018  . CKD (chronic kidney disease) stage 5, GFR less than 15 ml/min (HCC) 06/14/2018  . Anemia due to stage 5 chronic kidney disease, not on chronic dialysis ([place]) 06/14/2018  . Systolic CHF ([place]) 91/63/8466  . Diabetes mellitus type 2 with complications ([place]) 59/93/5701  . Transaminasemia 11/29/2016  . Cardiomyopathy ([place]) 11/12/2016  . Lipoma 10/21/2016  . CKD (chronic kidney disease) stage 4, GFR 15-29 ml/min (HCC) 08/10/2016  . Anxiety and depression 12/12/2015  .  Diabetic nephropathy associated with type 2 diabetes mellitus ([place]) 11/15/2014  . Moderate nonproliferative diabetic retinopathy(362.05) 04/17/2014  . 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 Essential hypertension 03/28/2014  . Dental caries 03/28/2014  . Dyslipidemia 04/18/2013   Past Medical History:  Diagnosis Date  . Allergy   . Anemia   . Anxiety   . Blood transfusion without reported diagnosis   . Cardiomyopathy ([place]) 11/2016   no ischemic eval due to CKD  . CHF (congestive heart failure) ([place])   . CKD (chronic kidney disease), stage IV ([place])   . Depression   . Diabetes mellitus without complication ([place])    type 2  . Dyspnea   . Hypertension   . Obesity     Family History  Problem Relation Age of Onset  . Diabetes Brother   . Stomach cancer Brother   . Kidney disease Brother   . Heart Problems Maternal Grandmother        pacemaker  . Heart disease Maternal Grandfather   . Rectal cancer Neg Hx   . Esophageal cancer Neg Hx   . Liver cancer Neg Hx   . Colon cancer Neg Hx     Past Surgical History:  Procedure Laterality Date  . ABDOMINAL HYSTERECTOMY    . BREAST SURGERY     reduction  . [place]  . LIPOMA EXCISION     back  Dr. Excell Seltzer 03-22-18  . LIPOMA EXCISION N/A 03/22/2018   Procedure: EXCISION OF BACK LIPOMA;  Surgeon: Excell Seltzer, MD;  Location: [facility];  Service: General;  Laterality: N/A;  . REDUCTION MAMMAPLASTY Bilateral    Social History   Occupational History  . Occupation: disabled  Tobacco Use  . Smoking status: Never Smoker  . Smokeless tobacco: Never Used  Substance and Sexual Activity  . Alcohol use: No  . Drug use: No  . Sexual activity: Not Currently

## 2018-10-11 ENCOUNTER — Ambulatory Visit (HOSPITAL_COMMUNITY)
Admission: EM | Admit: 2018-10-11 | Discharge: 2018-10-11 | Disposition: A | Discharge status: Home / Self Care | Payer: Medicaid Other | Attending: Emergency Medicine | Admitting: Emergency Medicine

## 2018-10-11 ENCOUNTER — Encounter (HOSPITAL_COMMUNITY): Payer: Self-pay | Admitting: Emergency Medicine

## 2018-10-11 ENCOUNTER — Inpatient Hospital Stay (HOSPITAL_COMMUNITY): Admission: RE | Admit: 2018-10-11 | Payer: Medicaid Other | Source: Ambulatory Visit

## 2018-10-11 ENCOUNTER — Ambulatory Visit (INDEPENDENT_AMBULATORY_CARE_PROVIDER_SITE_OTHER): Payer: Medicaid Other

## 2018-10-11 DIAGNOSIS — R059 Cough, unspecified: Secondary | ICD-10-CM

## 2018-10-11 DIAGNOSIS — R05 Cough: Secondary | ICD-10-CM | POA: Diagnosis not present

## 2018-10-11 DIAGNOSIS — R0602 Shortness of breath: Secondary | ICD-10-CM | POA: Insufficient documentation

## 2018-10-11 DIAGNOSIS — J40 Bronchitis, not specified as acute or chronic: Secondary | ICD-10-CM | POA: Diagnosis not present

## 2018-10-11 DIAGNOSIS — R062 Wheezing: Secondary | ICD-10-CM

## 2018-10-11 MED ORDER — PREDNISONE 10 MG (21) PO TBPK: ORAL_TABLET | Freq: Every day | ORAL | 0 refills | Status: DC

## 2018-10-11 MED ORDER — ALBUTEROL SULFATE HFA 108 (90 BASE) MCG/ACT IN AERS: 1.0000 | INHALATION_SPRAY | Freq: Four times a day (QID) | RESPIRATORY_TRACT | 0 refills | Status: AC | PRN

## 2018-10-11 NOTE — ED Provider Notes (Signed)
[facility]    CSN: [id number] Arrival date & time: 10/11/18  1132     History   Chief Complaint Chief Complaint  Patient presents with  . URI    HPI Patricia [name] is a 62 y.o. female.   Pt state for a few days now she has had sob, cough and low grade fever. Feeling worse today. Took daughter neb tx at home and felt a little better. She was concerned due to she has CHF also. Denies any chest pain. Wants to have an chest x ray to make sure no pneumonia.      Past Medical History:  Diagnosis Date  . Allergy   . Anemia   . Anxiety   . Blood transfusion without reported diagnosis   . Cardiomyopathy ([place]) 11/2016   no ischemic eval due to CKD  . CHF (congestive heart failure) ([place])   . CKD (chronic kidney disease), stage IV ([place])   . Depression   . Diabetes mellitus without complication ([place])    type 2  . Dyspnea   . Hypertension   . Obesity     Patient Active Problem List   Diagnosis Date Noted  . Chronic combined systolic and diastolic heart failure ([place]) 06/14/2018  . CKD (chronic kidney disease) stage 5, GFR less than 15 ml/min (HCC) 06/14/2018  . Anemia due to stage 5 chronic kidney disease, not on chronic dialysis ([place]) 06/14/2018  . Systolic CHF ([place]) 14/78/2956  . Diabetes mellitus type 2 with complications ([place]) 21/30/8657  . Transaminasemia 11/29/2016  . Cardiomyopathy ([place]) 11/12/2016  . Lipoma 10/21/2016  . CKD (chronic kidney disease) stage 4, GFR 15-29 ml/min (HCC) 08/10/2016  . Anxiety and depression 12/12/2015  . Diabetic nephropathy associated with type 2 diabetes mellitus ([place]) 11/15/2014  . Moderate nonproliferative diabetic retinopathy(362.05) 04/17/2014  . 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 Essential hypertension 03/28/2014  . Dental caries 03/28/2014  . Dyslipidemia 04/18/2013    Past Surgical History:  Procedure Laterality Date  . ABDOMINAL HYSTERECTOMY    . BREAST SURGERY     reduction  . [place]  . LIPOMA EXCISION     back  Dr. Excell Seltzer 03-22-18  . LIPOMA EXCISION N/A 03/22/2018   Procedure: EXCISION OF BACK LIPOMA;  Surgeon: Excell Seltzer, MD;  Location: [facility];  Service: General;  Laterality: N/A;  . REDUCTION MAMMAPLASTY Bilateral     OB History    Gravida  2   Para  2   Term  2   Preterm      AB      Living  2     SAB      TAB      Ectopic      Multiple      Live Births               Home Medications    Prior to Admission medications   Medication Sig Start Date End Date Taking? Authorizing Provider  ACCU-CHEK FASTCLIX LANCETS MISC Use as directed 09/20/18   Ladell Pier, MD  acetaminophen (TYLENOL) 500 MG tablet Take 500-1,000 mg by mouth every 6 (six) hours as needed (for pain or headaches).    [provider]  albuterol (PROVENTIL HFA;VENTOLIN HFA) 108 (90 Base) MCG/ACT inhaler Inhale 1-2 puffs into the lungs every 6 (six) hours as needed for wheezing or shortness of breath. 10/11/18   Marney Setting, NP  aspirin EC 81 MG tablet Take 1  tablet (81 mg total) by mouth daily. 01/04/17   Maren Reamer, MD  atorvastatin (LIPITOR) 20 MG tablet Take 0.5 tablets (10 mg total) by mouth daily. 07/15/18   Ladell Pier, MD  Bee Pollen 550 MG CAPS Take 550 mg by mouth daily as needed (to boost the immune system).     [provider]  bismuth subsalicylate (PEPTO BISMOL) 262 MG/15ML suspension Take 30 mLs by mouth every 6 (six) hours as needed for indigestion.     [provider]  Blood Glucose Monitoring Suppl (ACCU-CHEK AVIVA PLUS) w/Device KIT Use as directed. 09/20/18   Ladell Pier, MD  calcitRIOL (ROCALTROL) 0.25 MCG capsule Take 0.25 mcg by mouth daily.  02/15/18   [provider]  calcium carbonate (TUMS - DOSED IN MG ELEMENTAL CALCIUM) 500 MG chewable tablet Chew 1 tablet by mouth 3 (three) times daily as needed for indigestion or heartburn.     [provider]  carvedilol  (COREG) 25 MG tablet TAKE 1 TABLET BY MOUTH TWICE DAILY WITH A MEAL 04/21/18   Strader, [place] M, PA-C  escitalopram (LEXAPRO) 20 MG tablet TAKE 1 TABLET BY MOUTH AT BEDTIME. 03/30/18   Ladell Pier, MD  fluticasone (FLONASE) 50 MCG/ACT nasal spray Place 1 spray into both nostrils daily. 07/02/18   Jacqualine Mau, NP  furosemide (LASIX) 40 MG tablet TAKE 1 TABLET BY MOUTH 2 TIMES DAILY. Patient taking differently: Take 80 mg by mouth daily.  11/19/17   Ladell Pier, MD  gabapentin (NEURONTIN) 100 MG capsule Take 1 capsule (100 mg total) by mouth at bedtime. 11/19/17   Ladell Pier, MD  Garlic 371 MG TABS Take 100 mg by mouth daily.     [provider]  glipiZIDE (GLUCOTROL) 10 MG tablet Take 1 tablet (10 mg total) by mouth daily before breakfast. 09/20/18   Ladell Pier, MD  glucose 4 GM chewable tablet Chew 1 tablet by mouth as needed for low blood sugar.    [provider]  glucose blood (ACCU-CHEK AVIVA PLUS) test strip Use as instructed 09/20/18   Ladell Pier, MD  hydrALAZINE (APRESOLINE) 50 MG tablet Take 1 tablet (50 mg total) by mouth 2 (two) times daily. 09/16/18   Ladell Pier, MD  insulin aspart (NOVOLOG) 100 UNIT/ML injection 3 units before meals. Patient taking differently: Inject 5 Units into the skin 3 (three) times daily as needed (before meals for high blood sugar levels).  04/06/17   Ladell Pier, MD  Insulin Glargine (LANTUS SOLOSTAR) 100 UNIT/ML Solostar Pen Inject 24 Units into the skin at bedtime. 09/20/18   Ladell Pier, MD  Insulin Pen Needle (TRUEPLUS PEN NEEDLES) 32G X 4 MM MISC Use as directed to inject insulin. 03/25/18   Ladell Pier, MD  Insulin Pen Needle 31G X 5 MM MISC Use as directed 03/28/18   Ladell Pier, MD  isosorbide mononitrate (IMDUR) 30 MG 24 hr tablet Take 0.5 tablets (15 mg total) by mouth daily. 05/23/18   Lendon Colonel, NP  predniSONE (DELTASONE) 20 MG tablet Take 2 pills  today and tomorrow then 1 pill x 2 days then 1/2 pill x 4 days 09/15/18   Fulp, Cammie, MD  predniSONE (STERAPRED UNI-PAK 21 TAB) 10 MG (21) TBPK tablet Take by mouth daily. Take 6 tabs by mouth daily  for 2 days, then 5 tabs for 2 days, then 4 tabs for 2 days, then 3 tabs for 2 days,  2 tabs for 2 days, then 1 tab by mouth daily for 2 days 10/11/18   Marney Setting, NP    Family History Family History  Problem Relation Age of Onset  . Diabetes Brother   . Stomach cancer Brother   . Kidney disease Brother   . Heart Problems Maternal Grandmother        pacemaker  . Heart disease Maternal Grandfather   . Rectal cancer Neg Hx   . Esophageal cancer Neg Hx   . Liver cancer Neg Hx   . Colon cancer Neg Hx     Social History Social History   Tobacco Use  . Smoking status: Never Smoker  . Smokeless tobacco: Never Used  Substance Use Topics  . Alcohol use: No  . Drug use: No     Allergies   Codone [hydrocodone]; Norvasc [amlodipine besylate]; and Sulfa antibiotics   Review of Systems Review of Systems  Constitutional: Positive for activity change, appetite change, fatigue and fever.  HENT: Negative.   Eyes: Negative.   Respiratory: Positive for cough and wheezing.   Cardiovascular: Negative.   Gastrointestinal: Negative.   Genitourinary: Negative.   Musculoskeletal:       Weakness   Skin: Negative.   Neurological: Negative.      Physical Exam Triage Vital Signs ED Triage Vitals [10/11/18 1312]  Enc Vitals Group     BP (!) 164/84     Pulse Rate 89     Resp 18     Temp 98.1 F (36.7 C)     Temp Source Oral     SpO2 98 %     Weight      Height      Head Circumference      Peak Flow      Pain Score 4     Pain Loc      Pain Edu?      Excl. in [place]?    No data found.  Updated Vital Signs BP (!) 164/84 (BP Location: Right Arm)   Pulse 89   Temp 98.1 F (36.7 C) (Oral)   Resp 18   SpO2 98%   Visual Acuity Right Eye Distance:   Left Eye Distance:     Bilateral Distance:    Right Eye Near:   Left Eye Near:    Bilateral Near:     Physical Exam Constitutional:      Appearance: She is ill-appearing.  HENT:     Head: Normocephalic.     Mouth/Throat:     Mouth: Mucous membranes are moist.  Eyes:     Pupils: Pupils are equal, round, and reactive to light.  Neck:     Musculoskeletal: Normal range of motion.  Cardiovascular:     Rate and Rhythm: Tachycardia present.  Pulmonary:     Breath sounds: Wheezing and rhonchi present.  Musculoskeletal: Normal range of motion.  Skin:    General: Skin is warm.     Capillary Refill: Capillary refill takes less than 2 seconds.  Neurological:     General: No focal deficit present.     Mental Status: She is alert.      UC Treatments / Results  Labs (all labs ordered are listed, but only abnormal results are displayed) Labs Reviewed - No data to display  EKG None  Radiology Dg Chest 2 View  Result Date: 10/11/2018 CLINICAL DATA:  Low grade fever, cough and wheezing for 3 days. EXAM: CHEST - 2 VIEW COMPARISON:  PA  and lateral chest 11/04/2017 and 11/04/2016. FINDINGS: There is cardiomegaly and pulmonary vascular congestion. Chronic blunting at the costophrenic angles could be due to scar or trace effusions. No consolidative process or pneumothorax. Aortic atherosclerosis is noted. IMPRESSION: Cardiomegaly and pulmonary vascular congestion. Chronic trace bilateral pleural effusions versus pleural scar. Electronically Signed   By: Inge Rise M.D.   On: 10/11/2018 14:13    Procedures Procedures (including critical care time)  Medications Ordered in UC Medications - No data to display  Initial Impression / Assessment and Plan / UC Course  I have reviewed the triage vital signs and the nursing notes.  Pertinent labs & imaging results that were available during my care of the patient were reviewed by me and considered in my medical decision making (see chart for details).      Stay hydrated  Use inhaler as needed for sob Will need to follow up with pcp  Discussed pt with brian MD recommend steroid, inhaler and go to er with worse sx  Final Clinical Impressions(s) / UC Diagnoses   Final diagnoses:  Cough  Shortness of breath  Bronchitis   Discharge Instructions   None    ED Prescriptions    Medication Sig Dispense Auth. Provider   albuterol (PROVENTIL HFA;VENTOLIN HFA) 108 (90 Base) MCG/ACT inhaler Inhale 1-2 puffs into the lungs every 6 (six) hours as needed for wheezing or shortness of breath. 1 Inhaler Morley Kos L, NP   predniSONE (STERAPRED UNI-PAK 21 TAB) 10 MG (21) TBPK tablet Take by mouth daily. Take 6 tabs by mouth daily  for 2 days, then 5 tabs for 2 days, then 4 tabs for 2 days, then 3 tabs for 2 days, 2 tabs for 2 days, then 1 tab by mouth daily for 2 days 42 tablet Marney Setting, NP     Controlled Substance Prescriptions [place] Controlled Substance Registry consulted? Not Applicable   Marney Setting, NP 10/11/18 1439

## 2018-10-11 NOTE — ED Triage Notes (Signed)
Pt here with URI and cough

## 2018-10-11 NOTE — Discharge Instructions (Addendum)
Go to the ER with worse sx or shortness of breath

## 2018-10-18 ENCOUNTER — Ambulatory Visit: Payer: Medicaid Other | Attending: Internal Medicine | Admitting: Internal Medicine

## 2018-10-18 ENCOUNTER — Encounter: Payer: Self-pay | Admitting: Internal Medicine

## 2018-10-18 VITALS — BP 174/97 | HR 81 | Temp 98.3°F | Resp 16 | Ht 64.5 in | Wt 191.4 lb

## 2018-10-18 DIAGNOSIS — Z79899 Other long term (current) drug therapy: Secondary | ICD-10-CM | POA: Insufficient documentation

## 2018-10-18 DIAGNOSIS — D631 Anemia in chronic kidney disease: Secondary | ICD-10-CM | POA: Insufficient documentation

## 2018-10-18 DIAGNOSIS — E1122 Type 2 diabetes mellitus with diabetic chronic kidney disease: Secondary | ICD-10-CM | POA: Diagnosis not present

## 2018-10-18 DIAGNOSIS — I132 Hypertensive heart and chronic kidney disease with heart failure and with stage 5 chronic kidney disease, or end stage renal disease: Secondary | ICD-10-CM | POA: Diagnosis not present

## 2018-10-18 DIAGNOSIS — N186 End stage renal disease: Secondary | ICD-10-CM

## 2018-10-18 DIAGNOSIS — Z7982 Long term (current) use of aspirin: Secondary | ICD-10-CM | POA: Insufficient documentation

## 2018-10-18 DIAGNOSIS — Z885 Allergy status to narcotic agent status: Secondary | ICD-10-CM | POA: Insufficient documentation

## 2018-10-18 DIAGNOSIS — E11319 Type 2 diabetes mellitus with unspecified diabetic retinopathy without macular edema: Secondary | ICD-10-CM | POA: Insufficient documentation

## 2018-10-18 DIAGNOSIS — E785 Hyperlipidemia, unspecified: Secondary | ICD-10-CM | POA: Diagnosis not present

## 2018-10-18 DIAGNOSIS — I10 Essential (primary) hypertension: Secondary | ICD-10-CM

## 2018-10-18 DIAGNOSIS — Z8249 Family history of ischemic heart disease and other diseases of the circulatory system: Secondary | ICD-10-CM | POA: Diagnosis not present

## 2018-10-18 DIAGNOSIS — E1121 Type 2 diabetes mellitus with diabetic nephropathy: Secondary | ICD-10-CM | POA: Diagnosis present

## 2018-10-18 DIAGNOSIS — Z794 Long term (current) use of insulin: Secondary | ICD-10-CM

## 2018-10-18 DIAGNOSIS — Z882 Allergy status to sulfonamides status: Secondary | ICD-10-CM | POA: Diagnosis not present

## 2018-10-18 DIAGNOSIS — I5042 Chronic combined systolic (congestive) and diastolic (congestive) heart failure: Secondary | ICD-10-CM | POA: Diagnosis not present

## 2018-10-18 DIAGNOSIS — I429 Cardiomyopathy, unspecified: Secondary | ICD-10-CM | POA: Insufficient documentation

## 2018-10-18 LAB — POCT GLYCOSYLATED HEMOGLOBIN (HGB A1C): HBA1C, POC (PREDIABETIC RANGE): 6 % (ref 5.7–6.4)

## 2018-10-18 LAB — GLUCOSE, POCT (MANUAL RESULT ENTRY): POC GLUCOSE: 166 mg/dL — AB (ref 70–99)

## 2018-10-18 NOTE — Progress Notes (Signed)
Patient ID: Patricia [name], female    DOB: [birth year]  MRN: [id number]  CC: Hypertension and Diabetes   Subjective: Patricia [name] is a 63 y.o. female who presents for chronic ds management.  Daughter is with her Her concerns today include:  Patient with history ofHTN, HL, DM type2 with retinopathy,neuropathyand nephropathy (CKD stage 4),s-CHF with EF improved from 25-30% to 40% as of 04/2017 possible Takotsubo variant, mixed anemia ACDand IDA, HL and depression  DM: Patient was having some low blood sugar episodes.  I had recommended decreasing the glipizide from 10 mg twice a day to once a day.  BS better since dose decrease.  Checking BS BID - a.m range 100-129, and bedtime 160s.  Needing new glucometer.  Prescription was sent to the pharmacy on December 10th for meter and strips.  However patient's daughter stated that she came the next day to pick up and it was not available.  I had my CMA check with the pharmacy today.  We were told that they were supposed to come back to pick up and did not so it was we shelved.  They will get it ready. Eating habits: Reports that she is doing good.  She avoids food that causes increase in her blood sugars Exercise:  Not very active.  Feels tired a lot Last eye exam 08/2018 with [facility]  ESRD: Reports that she still makes good urine.  Saw nephrologist yesterday.  She is leaning towards peritoneal dialysis but is reluctant in moving forward in getting procedure done to start.  She continues to get ? Arinesp shots every 4 weeks for the anemia of chronic disease.  She reports that her last hemoglobin checked yesterday was around 9. She noted Inc swelling in legs with recent prescription for prednisone.  She was prescribed this for bronchitis from urgent care.  She discontinued taking after she noticed the lower extremity edema.    CAD/HTN: She denies any chest pains.  She has had some wheezing and cough at nights associated with recent bronchitis.   However she is feeling better.  Endorses lower extremity edema.  Reports compliance with medications including furosemide twice a day.   She has not taken medicines as yet for today. Patient Active Problem List   Diagnosis Date Noted  . Chronic combined systolic and diastolic heart failure ([place]) 06/14/2018  . CKD (chronic kidney disease) stage 5, GFR less than 15 ml/min (HCC) 06/14/2018  . Anemia due to stage 5 chronic kidney disease, not on chronic dialysis ([place]) 06/14/2018  . Systolic CHF ([place]) 94/76/5465  . Diabetes mellitus type 2 with complications ([place]) 03/54/6568  . Transaminasemia 11/29/2016  . Cardiomyopathy ([place]) 11/12/2016  . Lipoma 10/21/2016  . CKD (chronic kidney disease) stage 4, GFR 15-29 ml/min (HCC) 08/10/2016  . Anxiety and depression 12/12/2015  . Diabetic nephropathy associated with type 2 diabetes mellitus ([place]) 11/15/2014  . Moderate nonproliferative diabetic retinopathy(362.05) 04/17/2014  . 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 Essential hypertension 03/28/2014  . Dental caries 03/28/2014  . Dyslipidemia 04/18/2013     Current Outpatient Medications on File Prior to Visit  Medication Sig Dispense Refill  . ACCU-CHEK FASTCLIX LANCETS MISC Use as directed 100 each 12  . acetaminophen (TYLENOL) 500 MG tablet Take 500-1,000 mg by mouth every 6 (six) hours as needed (for pain or headaches).    Marland Kitchen albuterol (PROVENTIL HFA;VENTOLIN HFA) 108 (90 Base) MCG/ACT inhaler Inhale 1-2 puffs into the lungs every 6 (six) hours as needed for  wheezing or shortness of breath. 1 Inhaler 0  . aspirin EC 81 MG tablet Take 1 tablet (81 mg total) by mouth daily. 90 tablet 3  . atorvastatin (LIPITOR) 20 MG tablet Take 0.5 tablets (10 mg total) by mouth daily. 15 tablet 6  . Bee Pollen 550 MG CAPS Take 550 mg by mouth daily as needed (to boost the immune system).     . bismuth subsalicylate (PEPTO BISMOL) 262 MG/15ML suspension Take 30 mLs by mouth every 6 (six) hours as needed for  indigestion.     . Blood Glucose Monitoring Suppl (ACCU-CHEK AVIVA PLUS) w/Device KIT Use as directed. 1 kit 0  . calcitRIOL (ROCALTROL) 0.25 MCG capsule Take 0.25 mcg by mouth daily.   11  . calcium carbonate (TUMS - DOSED IN MG ELEMENTAL CALCIUM) 500 MG chewable tablet Chew 1 tablet by mouth 3 (three) times daily as needed for indigestion or heartburn.     . carvedilol (COREG) 25 MG tablet TAKE 1 TABLET BY MOUTH TWICE DAILY WITH A MEAL 60 tablet 11  . escitalopram (LEXAPRO) 20 MG tablet TAKE 1 TABLET BY MOUTH AT BEDTIME. 30 tablet 2  . fluticasone (FLONASE) 50 MCG/ACT nasal spray Place 1 spray into both nostrils daily. 16 g 0  . furosemide (LASIX) 40 MG tablet TAKE 1 TABLET BY MOUTH 2 TIMES DAILY. (Patient taking differently: Take 80 mg by mouth daily. ) 60 tablet 1  . gabapentin (NEURONTIN) 100 MG capsule Take 1 capsule (100 mg total) by mouth at bedtime. 90 capsule 3  . Garlic 518 MG TABS Take 100 mg by mouth daily.     Marland Kitchen glipiZIDE (GLUCOTROL) 10 MG tablet Take 1 tablet (10 mg total) by mouth daily before breakfast. 30 tablet 2  . glucose 4 GM chewable tablet Chew 1 tablet by mouth as needed for low blood sugar.    Marland Kitchen glucose blood (ACCU-CHEK AVIVA PLUS) test strip Use as instructed 100 each 12  . hydrALAZINE (APRESOLINE) 50 MG tablet Take 1 tablet (50 mg total) by mouth 2 (two) times daily. 60 tablet 2  . insulin aspart (NOVOLOG) 100 UNIT/ML injection 3 units before meals. (Patient taking differently: Inject 5 Units into the skin 3 (three) times daily as needed (before meals for high blood sugar levels). ) 60 mL 3  . Insulin Glargine (LANTUS SOLOSTAR) 100 UNIT/ML Solostar Pen Inject 24 Units into the skin at bedtime. 15 mL 2  . Insulin Pen Needle (TRUEPLUS PEN NEEDLES) 32G X 4 MM MISC Use as directed to inject insulin. 100 each 3  . Insulin Pen Needle 31G X 5 MM MISC Use as directed 100 each 12  . isosorbide mononitrate (IMDUR) 30 MG 24 hr tablet Take 0.5 tablets (15 mg total) by mouth daily.  30 tablet 1  . predniSONE (STERAPRED UNI-PAK 21 TAB) 10 MG (21) TBPK tablet Take by mouth daily. Take 6 tabs by mouth daily  for 2 days, then 5 tabs for 2 days, then 4 tabs for 2 days, then 3 tabs for 2 days, 2 tabs for 2 days, then 1 tab by mouth daily for 2 days (Patient not taking: Reported on 10/18/2018) 42 tablet 0   No current facility-administered medications on file prior to visit.     Allergies  Allergen Reactions  . Codone [Hydrocodone] Itching  . Norvasc [Amlodipine Besylate] Swelling    Lower extremity?  . Sulfa Antibiotics Itching and Rash    Social History   Socioeconomic History  . Marital status: Divorced  Spouse name: Not on file  . Number of children: 2  . Years of education: Not on file  . Highest education level: Not on file  Occupational History  . Occupation: disabled  Social Needs  . Financial resource strain: Not on file  . Food insecurity:    Worry: Not on file    Inability: Not on file  . Transportation needs:    Medical: Not on file    Non-medical: Not on file  Tobacco Use  . Smoking status: Never Smoker  . Smokeless tobacco: Never Used  Substance and Sexual Activity  . Alcohol use: No  . Drug use: No  . Sexual activity: Not Currently  Lifestyle  . Physical activity:    Days per week: Not on file    Minutes per session: Not on file  . Stress: Not on file  Relationships  . Social connections:    Talks on phone: Not on file    Gets together: Not on file    Attends religious service: Not on file    Active member of club or organization: Not on file    Attends meetings of clubs or organizations: Not on file    Relationship status: Not on file  . Intimate partner violence:    Fear of current or ex partner: Not on file    Emotionally abused: Not on file    Physically abused: Not on file    Forced sexual activity: Not on file  Other Topics Concern  . Not on file  Social History Narrative  . Not on file    Family History  Problem  Relation Age of Onset  . Diabetes Brother   . Stomach cancer Brother   . Kidney disease Brother   . Heart Problems Maternal Grandmother        pacemaker  . Heart disease Maternal Grandfather   . Rectal cancer Neg Hx   . Esophageal cancer Neg Hx   . Liver cancer Neg Hx   . Colon cancer Neg Hx     Past Surgical History:  Procedure Laterality Date  . ABDOMINAL HYSTERECTOMY    . BREAST SURGERY     reduction  . [place]  . LIPOMA EXCISION     back  Dr. Excell Seltzer 03-22-18  . LIPOMA EXCISION N/A 03/22/2018   Procedure: EXCISION OF BACK LIPOMA;  Surgeon: Excell Seltzer, MD;  Location: [facility];  Service: General;  Laterality: N/A;  . REDUCTION MAMMAPLASTY Bilateral     ROS: Review of Systems Negative except as above PHYSICAL EXAM: BP (!) 174/97   Pulse 81   Temp 98.3 F (36.8 C) (Oral)   Resp 16   Ht 5' 4.5" (1.638 m)   Wt 191 lb 6.4 oz (86.8 kg)   SpO2 98%   BMI 32.35 kg/m   Wt Readings from Last 3 Encounters:  10/18/18 191 lb 6.4 oz (86.8 kg)  07/15/18 175 lb 12.8 oz (79.7 kg)  06/14/18 179 lb (81.2 kg)   BP 170/90  Physical Exam  General appearance - alert, well appearing, and in no distress Mental status - normal mood, behavior, speech, dress, motor activity, and thought processes Mouth - mucous membranes moist, pharynx normal without lesions Neck - supple, no significant adenopathy Chest - clear to auscultation, no wheezes, rales or rhonchi, symmetric air entry Heart -regular rate rhythm.  2/6 systolic ejection murmur along left sternal border. Extremities -1+ bilateral lower extremity edema  Results for orders placed or performed  in visit on 10/18/18  POCT glucose (manual entry)  Result Value Ref Range   POC Glucose 166 (A) 70 - 99 mg/dl  POCT glycosylated hemoglobin (Hb A1C)  Result Value Ref Range   Hemoglobin A1C     HbA1c POC (<> result, manual entry)     HbA1c, POC (prediabetic range) 6.0 5.7 - 6.4 %   HbA1c, POC (controlled  diabetic range)      ASSESSMENT AND PLAN: 1. Controlled type 2 diabetes mellitus with diabetic nephropathy, with long-term current use of insulin (HCC) Continue Lantus and low-dose glipizide.  Continue to monitor blood sugars.  Her daughter will stop at the pharmacy to let them know whether she will wait to get the new glucometer today I will come back to pick it up. - POCT glucose (manual entry) - POCT glycosylated hemoglobin (Hb A1C)  2. Essential hypertension Not at goal.  She has not taken medicines as yet today.  Continue current medications and low-salt diet.  3. Chronic combined systolic and diastolic heart failure ([place]) She has some lower extremity edema today.  I recommend increase furosemide from twice daily to 3 times daily dosing for the next 2 days then go back to twice a day. Continue to limit salt in the foods.  4. ESRD (end stage renal disease) ([place]) Followed by nephrology.   Patient was given the opportunity to ask questions.  Patient verbalized understanding of the plan and was able to repeat key elements of the plan.   Orders Placed This Encounter  Procedures  . POCT glucose (manual entry)  . POCT glycosylated hemoglobin (Hb A1C)     Requested Prescriptions    No prescriptions requested or ordered in this encounter    Return in about 4 months (around 02/16/2019).  Patricia Plumber, MD, FACP

## 2018-10-18 NOTE — Patient Instructions (Signed)
Due to the lower extremity swelling, I recommend taking furosemide 3 times a day for the next 2 days then go back to taking it twice a day as prescribed.

## 2018-10-20 ENCOUNTER — Other Ambulatory Visit: Payer: Self-pay | Admitting: Adult Health

## 2018-10-20 ENCOUNTER — Other Ambulatory Visit: Payer: Self-pay | Admitting: Internal Medicine

## 2018-10-20 DIAGNOSIS — F419 Anxiety disorder, unspecified: Principal | ICD-10-CM

## 2018-10-20 DIAGNOSIS — F32A Depression, unspecified: Secondary | ICD-10-CM

## 2018-10-20 DIAGNOSIS — F329 Major depressive disorder, single episode, unspecified: Secondary | ICD-10-CM

## 2018-10-20 MED FILL — ATORVASTATIN 20 MG TABLET: 20 | 30 days supply | Qty: 15 | Fill #2

## 2018-10-20 MED FILL — glipiZIDE 10 MG TABS: 10 | 30 days supply | Qty: 60 | Fill #2

## 2018-10-21 ENCOUNTER — Other Ambulatory Visit: Payer: Self-pay

## 2018-10-21 DIAGNOSIS — E118 Type 2 diabetes mellitus with unspecified complications: Secondary | ICD-10-CM

## 2018-10-21 MED ORDER — GLUCOSE BLOOD VI STRP: ORAL_STRIP | 12 refills | Status: AC

## 2018-10-21 MED ORDER — ACCU-CHEK GUIDE W/DEVICE KIT: 1.0000 | PACK | Freq: Three times a day (TID) | 0 refills | Status: AC

## 2018-10-21 MED ORDER — ACCU-CHEK FASTCLIX LANCETS MISC: 12 refills | Status: AC

## 2018-10-21 MED FILL — ACCU-CHEK GUIDE W/DEVICE KI: W/DEVICE | 1 days supply | Qty: 1 | Fill #0

## 2018-10-21 MED FILL — ACCU-CHEK FASTCLIX LANCETS: 34 days supply | Qty: 102 | Fill #0

## 2018-10-21 MED FILL — ACCU-CHEK GUIDE TEST STRIP: 33 days supply | Qty: 100 | Fill #0

## 2018-10-21 MED FILL — ESCITALOPRAM 20 MG TABLET: 20 | 30 days supply | Qty: 30 | Fill #0

## 2018-10-21 MED FILL — ISOSORBIDE MN ER 30 MG TAB: 30 | 30 days supply | Qty: 15 | Fill #0

## 2018-10-25 ENCOUNTER — Ambulatory Visit (HOSPITAL_COMMUNITY)
Admission: RE | Admit: 2018-10-25 | Discharge: 2018-10-25 | Disposition: A | Discharge status: Home / Self Care | Payer: Medicaid Other | Source: Ambulatory Visit | Attending: Nephrology | Admitting: Nephrology

## 2018-10-25 VITALS — BP 144/65 | HR 82 | Temp 98.2°F | Resp 20

## 2018-10-25 DIAGNOSIS — N184 Chronic kidney disease, stage 4 (severe): Secondary | ICD-10-CM | POA: Diagnosis not present

## 2018-10-25 LAB — RENAL FUNCTION PANEL
Albumin: 2.6 g/dL — ABNORMAL LOW (ref 3.5–5.0)
Anion gap: 13 (ref 5–15)
BUN: 82 mg/dL — ABNORMAL HIGH (ref 6–20)
CO2: 17 mmol/L — ABNORMAL LOW (ref 22–32)
Calcium: 7.5 mg/dL — ABNORMAL LOW (ref 8.9–10.3)
Chloride: 110 mmol/L (ref 98–111)
Creatinine, Ser: 7.66 mg/dL — ABNORMAL HIGH (ref 0.44–1.00)
GFR calc Af Amer: 6 mL/min — ABNORMAL LOW (ref >60)
GFR, EST NON AFRICAN AMERICAN: 5 mL/min — AB (ref >60)
Glucose, Bld: 128 mg/dL — ABNORMAL HIGH (ref 70–99)
Phosphorus: 7.9 mg/dL — ABNORMAL HIGH (ref 2.5–4.6)
Potassium: 4.5 mmol/L (ref 3.5–5.1)
Sodium: 140 mmol/L (ref 135–145)

## 2018-10-25 LAB — POCT HEMOGLOBIN-HEMACUE: Hemoglobin: 7.7 g/dL — ABNORMAL LOW (ref 12.0–15.0)

## 2018-10-25 LAB — IRON AND TIBC
Iron: 30 ug/dL (ref 28–170)
Saturation Ratios: 12 % (ref 10.4–31.8)
TIBC: 252 ug/dL (ref 250–450)
UIBC: 222 ug/dL

## 2018-10-25 LAB — FERRITIN: Ferritin: 252 ng/mL (ref 11–307)

## 2018-10-25 MED ORDER — DARBEPOETIN ALFA 200 MCG/0.4ML IJ SOSY
PREFILLED_SYRINGE | INTRAMUSCULAR | Status: AC
  Filled 2018-10-25: qty 0.4

## 2018-10-25 MED ORDER — DARBEPOETIN ALFA 200 MCG/0.4ML IJ SOSY
200.0000 ug | PREFILLED_SYRINGE | INTRAMUSCULAR | Status: DC
  Administered 2018-10-25: 200 ug via SUBCUTANEOUS

## 2018-10-25 NOTE — Therapy (Signed)
hemocue 7.7 today pt tired but denies chest pain , shortness of breath, and stated she has not seen any bleeding. Pt missed last appt with [place] because she was sick with bronchitis.  Orders received from [place] at [facility] to proceed with pt's shot per Dr Joelyn Oms.

## 2018-10-26 ENCOUNTER — Telehealth: Payer: Self-pay | Admitting: Cardiovascular Disease

## 2018-10-26 NOTE — Telephone Encounter (Signed)
Spoke with pt who states she was recently Dx with bronchitis and prescribed prednisone. She report she has since stop taking the medication because it was causing her to retain fluid. Pt states she is still having some SOB and leg edema when she had her legs down despite taking her normal dose of lasix. Per Dr. Percival Spanish (DOD), pt needs to contact her PCP as he can not increase her lasix due to her kidney function and feels it could be related to her kidney as pt states she has not start dialysis yet. Pt verbalized understanding.

## 2018-10-26 NOTE — Telephone Encounter (Signed)
New Message   Pt c/o swelling: STAT is pt has developed SOB within 24 hours  1) How much weight have you gained and in what time span? Unknown. Patient says she believes she has gained some weight because she went to the doctor and had fluid  2) If swelling, where is the swelling located? Legs   3) Are you currently taking a fluid pill? yes  4) Are you currently SOB? Yes, can hear shortness of breath  5) Do you have a log of your daily weights (if so, list)? Unknown   6) Have you gained 3 pounds in a day or 5 pounds in a week? unknown  7) Have you traveled recently? No

## 2018-11-03 MED FILL — hydrALAZINE HCL 50 MG TABS: 50 | 30 days supply | Qty: 60 | Fill #1

## 2018-11-14 ENCOUNTER — Other Ambulatory Visit (HOSPITAL_COMMUNITY): Payer: Self-pay | Admitting: *Deleted

## 2018-11-15 ENCOUNTER — Ambulatory Visit (HOSPITAL_COMMUNITY)
Admission: RE | Admit: 2018-11-15 | Discharge: 2018-11-15 | Disposition: A | Discharge status: Home / Self Care | Payer: Medicaid Other | Source: Ambulatory Visit | Attending: Nephrology | Admitting: Nephrology

## 2018-11-15 VITALS — BP 144/69 | HR 77 | Temp 98.6°F | Resp 20 | Ht 64.0 in | Wt 181.0 lb

## 2018-11-15 DIAGNOSIS — N184 Chronic kidney disease, stage 4 (severe): Secondary | ICD-10-CM | POA: Insufficient documentation

## 2018-11-15 LAB — POCT HEMOGLOBIN-HEMACUE: HEMOGLOBIN: 8 g/dL — AB (ref 12.0–15.0)

## 2018-11-15 MED ORDER — DARBEPOETIN ALFA 200 MCG/0.4ML IJ SOSY
PREFILLED_SYRINGE | INTRAMUSCULAR | Status: AC
  Filled 2018-11-15: qty 0.4

## 2018-11-15 MED ORDER — DARBEPOETIN ALFA 200 MCG/0.4ML IJ SOSY
200.0000 ug | PREFILLED_SYRINGE | INTRAMUSCULAR | Status: DC
  Administered 2018-11-15: 200 ug via SUBCUTANEOUS

## 2018-11-15 MED ORDER — SODIUM CHLORIDE 0.9 % IV SOLN
510.0000 mg | Freq: Once | INTRAVENOUS | Status: AC
  Administered 2018-11-15: 510 mg via INTRAVENOUS
  Filled 2018-11-15: qty 510

## 2018-11-29 ENCOUNTER — Encounter (HOSPITAL_COMMUNITY): Payer: Medicaid Other

## 2018-12-02 ENCOUNTER — Other Ambulatory Visit: Payer: Self-pay | Admitting: Internal Medicine

## 2018-12-02 MED FILL — ISOSORBIDE MN ER 30 MG TAB: 30 | 30 days supply | Qty: 15 | Fill #1

## 2018-12-02 MED FILL — ATORVASTATIN 20 MG TABLET: 20 | 30 days supply | Qty: 15 | Fill #3

## 2018-12-04 ENCOUNTER — Other Ambulatory Visit: Payer: Self-pay

## 2018-12-04 ENCOUNTER — Encounter (HOSPITAL_COMMUNITY): Payer: Self-pay | Admitting: Emergency Medicine

## 2018-12-04 ENCOUNTER — Inpatient Hospital Stay (HOSPITAL_COMMUNITY)
Admission: EM | Admit: 2018-12-04 | Discharge: 2018-12-07 | DRG: 291 | Disposition: A | Discharge status: Home / Self Care | Payer: Medicaid Other | Attending: Internal Medicine | Admitting: Internal Medicine

## 2018-12-04 DIAGNOSIS — Z833 Family history of diabetes mellitus: Secondary | ICD-10-CM

## 2018-12-04 DIAGNOSIS — Z8249 Family history of ischemic heart disease and other diseases of the circulatory system: Secondary | ICD-10-CM

## 2018-12-04 DIAGNOSIS — I132 Hypertensive heart and chronic kidney disease with heart failure and with stage 5 chronic kidney disease, or end stage renal disease: Principal | ICD-10-CM | POA: Diagnosis present

## 2018-12-04 DIAGNOSIS — E1129 Type 2 diabetes mellitus with other diabetic kidney complication: Secondary | ICD-10-CM | POA: Diagnosis present

## 2018-12-04 DIAGNOSIS — E785 Hyperlipidemia, unspecified: Secondary | ICD-10-CM | POA: Diagnosis present

## 2018-12-04 DIAGNOSIS — E1122 Type 2 diabetes mellitus with diabetic chronic kidney disease: Secondary | ICD-10-CM | POA: Diagnosis present

## 2018-12-04 DIAGNOSIS — Z7952 Long term (current) use of systemic steroids: Secondary | ICD-10-CM

## 2018-12-04 DIAGNOSIS — D631 Anemia in chronic kidney disease: Secondary | ICD-10-CM | POA: Diagnosis present

## 2018-12-04 DIAGNOSIS — Z79899 Other long term (current) drug therapy: Secondary | ICD-10-CM

## 2018-12-04 DIAGNOSIS — I5042 Chronic combined systolic (congestive) and diastolic (congestive) heart failure: Secondary | ICD-10-CM

## 2018-12-04 DIAGNOSIS — Z800 Family history of malignant neoplasm of digestive organs: Secondary | ICD-10-CM

## 2018-12-04 DIAGNOSIS — I5043 Acute on chronic combined systolic (congestive) and diastolic (congestive) heart failure: Secondary | ICD-10-CM | POA: Diagnosis present

## 2018-12-04 DIAGNOSIS — E162 Hypoglycemia, unspecified: Secondary | ICD-10-CM | POA: Diagnosis present

## 2018-12-04 DIAGNOSIS — Z7982 Long term (current) use of aspirin: Secondary | ICD-10-CM

## 2018-12-04 DIAGNOSIS — R630 Anorexia: Secondary | ICD-10-CM | POA: Diagnosis present

## 2018-12-04 DIAGNOSIS — I509 Heart failure, unspecified: Secondary | ICD-10-CM

## 2018-12-04 DIAGNOSIS — E669 Obesity, unspecified: Secondary | ICD-10-CM | POA: Diagnosis present

## 2018-12-04 DIAGNOSIS — Z794 Long term (current) use of insulin: Secondary | ICD-10-CM

## 2018-12-04 DIAGNOSIS — F419 Anxiety disorder, unspecified: Secondary | ICD-10-CM | POA: Diagnosis present

## 2018-12-04 DIAGNOSIS — E11649 Type 2 diabetes mellitus with hypoglycemia without coma: Secondary | ICD-10-CM | POA: Diagnosis present

## 2018-12-04 DIAGNOSIS — I10 Essential (primary) hypertension: Secondary | ICD-10-CM | POA: Diagnosis present

## 2018-12-04 DIAGNOSIS — D649 Anemia, unspecified: Secondary | ICD-10-CM | POA: Diagnosis present

## 2018-12-04 DIAGNOSIS — N186 End stage renal disease: Secondary | ICD-10-CM | POA: Diagnosis present

## 2018-12-04 DIAGNOSIS — N185 Chronic kidney disease, stage 5: Secondary | ICD-10-CM | POA: Diagnosis present

## 2018-12-04 DIAGNOSIS — Z841 Family history of disorders of kidney and ureter: Secondary | ICD-10-CM

## 2018-12-04 DIAGNOSIS — Z6830 Body mass index (BMI) 30.0-30.9, adult: Secondary | ICD-10-CM

## 2018-12-04 DIAGNOSIS — F329 Major depressive disorder, single episode, unspecified: Secondary | ICD-10-CM | POA: Diagnosis present

## 2018-12-04 DIAGNOSIS — E8770 Fluid overload, unspecified: Secondary | ICD-10-CM | POA: Diagnosis present

## 2018-12-04 DIAGNOSIS — Z90710 Acquired absence of both cervix and uterus: Secondary | ICD-10-CM

## 2018-12-04 DIAGNOSIS — Z7951 Long term (current) use of inhaled steroids: Secondary | ICD-10-CM

## 2018-12-04 DIAGNOSIS — N2581 Secondary hyperparathyroidism of renal origin: Secondary | ICD-10-CM | POA: Diagnosis present

## 2018-12-04 DIAGNOSIS — I152 Hypertension secondary to endocrine disorders: Secondary | ICD-10-CM | POA: Diagnosis present

## 2018-12-04 DIAGNOSIS — F418 Other specified anxiety disorders: Secondary | ICD-10-CM | POA: Diagnosis present

## 2018-12-04 MED ORDER — SODIUM CHLORIDE 0.9% FLUSH: 3.0000 mL | Freq: Once | INTRAVENOUS | Status: DC

## 2018-12-04 NOTE — ED Triage Notes (Addendum)
C/o productive cough with clear phlegm x 3 weeks, swelling to bilateral lower extremities x 1 week, increased SOB this week, L sided neck pain since this afternoon, and feeling like a "weight" on L chest that started tonight.  Pt took 1 extra Lasix pill tonight.

## 2018-12-05 ENCOUNTER — Emergency Department (HOSPITAL_COMMUNITY): Payer: Medicaid Other

## 2018-12-05 ENCOUNTER — Other Ambulatory Visit: Payer: Self-pay

## 2018-12-05 ENCOUNTER — Observation Stay (HOSPITAL_BASED_OUTPATIENT_CLINIC_OR_DEPARTMENT_OTHER): Payer: Medicaid Other

## 2018-12-05 DIAGNOSIS — F419 Anxiety disorder, unspecified: Secondary | ICD-10-CM | POA: Diagnosis not present

## 2018-12-05 DIAGNOSIS — I5043 Acute on chronic combined systolic (congestive) and diastolic (congestive) heart failure: Secondary | ICD-10-CM

## 2018-12-05 DIAGNOSIS — I340 Nonrheumatic mitral (valve) insufficiency: Secondary | ICD-10-CM

## 2018-12-05 DIAGNOSIS — E8770 Fluid overload, unspecified: Secondary | ICD-10-CM | POA: Diagnosis present

## 2018-12-05 DIAGNOSIS — D631 Anemia in chronic kidney disease: Secondary | ICD-10-CM

## 2018-12-05 DIAGNOSIS — N185 Chronic kidney disease, stage 5: Secondary | ICD-10-CM | POA: Diagnosis not present

## 2018-12-05 DIAGNOSIS — E162 Hypoglycemia, unspecified: Secondary | ICD-10-CM

## 2018-12-05 DIAGNOSIS — F329 Major depressive disorder, single episode, unspecified: Secondary | ICD-10-CM

## 2018-12-05 DIAGNOSIS — E1129 Type 2 diabetes mellitus with other diabetic kidney complication: Secondary | ICD-10-CM | POA: Diagnosis present

## 2018-12-05 DIAGNOSIS — I10 Essential (primary) hypertension: Secondary | ICD-10-CM

## 2018-12-05 LAB — CBC
HEMATOCRIT: 26.6 % — AB (ref 36.0–46.0)
Hemoglobin: 8 g/dL — ABNORMAL LOW (ref 12.0–15.0)
MCH: 28.5 pg (ref 26.0–34.0)
MCHC: 30.1 g/dL (ref 30.0–36.0)
MCV: 94.7 fL (ref 80.0–100.0)
Platelets: 144 10*3/uL — ABNORMAL LOW (ref 150–400)
RBC: 2.81 MIL/uL — ABNORMAL LOW (ref 3.87–5.11)
RDW: 17.1 % — AB (ref 11.5–15.5)
WBC: 7.3 10*3/uL (ref 4.0–10.5)
nRBC: 0 % (ref 0.0–0.2)

## 2018-12-05 LAB — BASIC METABOLIC PANEL
Anion gap: 12 (ref 5–15)
BUN: 74 mg/dL — ABNORMAL HIGH (ref 6–20)
CHLORIDE: 111 mmol/L (ref 98–111)
CO2: 15 mmol/L — ABNORMAL LOW (ref 22–32)
Calcium: 7.6 mg/dL — ABNORMAL LOW (ref 8.9–10.3)
Creatinine, Ser: 6.65 mg/dL — ABNORMAL HIGH (ref 0.44–1.00)
GFR calc Af Amer: 7 mL/min — ABNORMAL LOW (ref >60)
GFR calc non Af Amer: 6 mL/min — ABNORMAL LOW (ref >60)
Glucose, Bld: 67 mg/dL — ABNORMAL LOW (ref 70–99)
Potassium: 4 mmol/L (ref 3.5–5.1)
Sodium: 138 mmol/L (ref 135–145)

## 2018-12-05 LAB — BRAIN NATRIURETIC PEPTIDE: B Natriuretic Peptide: 1901.1 pg/mL — ABNORMAL HIGH (ref 0.0–100.0)

## 2018-12-05 LAB — GLUCOSE, CAPILLARY
GLUCOSE-CAPILLARY: 114 mg/dL — AB (ref 70–99)
Glucose-Capillary: 156 mg/dL — ABNORMAL HIGH (ref 70–99)
Glucose-Capillary: 140 mg/dL — ABNORMAL HIGH (ref 70–99)
Glucose-Capillary: 169 mg/dL — ABNORMAL HIGH (ref 70–99)

## 2018-12-05 LAB — I-STAT BETA HCG BLOOD, ED (MC, WL, AP ONLY): I-stat hCG, quantitative: 6.2 m[IU]/mL — ABNORMAL HIGH (ref <5)

## 2018-12-05 LAB — I-STAT TROPONIN, ED: Troponin i, poc: 0.04 ng/mL (ref 0.00–0.08)

## 2018-12-05 LAB — ECHOCARDIOGRAM COMPLETE
Height: 64 in
Weight: 2873.6 oz

## 2018-12-05 LAB — HIV ANTIBODY (ROUTINE TESTING W REFLEX): HIV Screen 4th Generation wRfx: NONREACTIVE

## 2018-12-05 LAB — CBG MONITORING, ED
GLUCOSE-CAPILLARY: 54 mg/dL — AB (ref 70–99)
Glucose-Capillary: 109 mg/dL — ABNORMAL HIGH (ref 70–99)

## 2018-12-05 MED ORDER — FUROSEMIDE 10 MG/ML IJ SOLN
80.0000 mg | Freq: Once | INTRAMUSCULAR | Status: AC
  Administered 2018-12-05: 80 mg via INTRAVENOUS
  Filled 2018-12-05: qty 8

## 2018-12-05 MED ORDER — SEVELAMER CARBONATE 800 MG PO TABS
1600.0000 mg | ORAL_TABLET | Freq: Three times a day (TID) | ORAL | Status: DC
  Administered 2018-12-05 – 2018-12-07 (×7): 1600 mg via ORAL
  Filled 2018-12-05 (×7): qty 2

## 2018-12-05 MED ORDER — ASPIRIN 81 MG PO CHEW
324.0000 mg | CHEWABLE_TABLET | Freq: Once | ORAL | Status: AC
  Administered 2018-12-05: 324 mg via ORAL
  Filled 2018-12-05: qty 4

## 2018-12-05 MED ORDER — ISOSORBIDE MONONITRATE ER 30 MG PO TB24
15.0000 mg | ORAL_TABLET | Freq: Every day | ORAL | Status: DC
  Administered 2018-12-05 – 2018-12-07 (×3): 15 mg via ORAL
  Filled 2018-12-05 (×3): qty 1

## 2018-12-05 MED ORDER — BISMUTH SUBSALICYLATE 262 MG/15ML PO SUSP: 30.0000 mL | Freq: Four times a day (QID) | ORAL | Status: DC | PRN

## 2018-12-05 MED ORDER — HEPARIN SODIUM (PORCINE) 5000 UNIT/ML IJ SOLN
5000.0000 [IU] | Freq: Three times a day (TID) | INTRAMUSCULAR | Status: DC
  Administered 2018-12-05 – 2018-12-07 (×7): 5000 [IU] via SUBCUTANEOUS
  Filled 2018-12-05 (×7): qty 1

## 2018-12-05 MED ORDER — ESCITALOPRAM OXALATE 20 MG PO TABS
20.0000 mg | ORAL_TABLET | Freq: Every day | ORAL | Status: DC
  Administered 2018-12-05 – 2018-12-06 (×2): 20 mg via ORAL
  Filled 2018-12-05: qty 2
  Filled 2018-12-05: qty 1
  Filled 2018-12-05: qty 2
  Filled 2018-12-05: qty 1

## 2018-12-05 MED ORDER — ASPIRIN EC 81 MG PO TBEC
81.0000 mg | DELAYED_RELEASE_TABLET | Freq: Every day | ORAL | Status: DC
  Administered 2018-12-05 – 2018-12-07 (×3): 81 mg via ORAL
  Filled 2018-12-05 (×3): qty 1

## 2018-12-05 MED ORDER — DM-GUAIFENESIN ER 30-600 MG PO TB12
1.0000 | ORAL_TABLET | Freq: Two times a day (BID) | ORAL | Status: DC | PRN
  Administered 2018-12-05: 1 via ORAL
  Filled 2018-12-05: qty 1

## 2018-12-05 MED ORDER — HYDRALAZINE HCL 50 MG PO TABS
50.0000 mg | ORAL_TABLET | Freq: Two times a day (BID) | ORAL | Status: DC
  Administered 2018-12-05 – 2018-12-07 (×6): 50 mg via ORAL
  Filled 2018-12-05 (×6): qty 1

## 2018-12-05 MED ORDER — GABAPENTIN 100 MG PO CAPS
100.0000 mg | ORAL_CAPSULE | Freq: Every day | ORAL | Status: DC
  Administered 2018-12-05 – 2018-12-06 (×2): 100 mg via ORAL
  Filled 2018-12-05 (×2): qty 1

## 2018-12-05 MED ORDER — HYDROXYZINE HCL 10 MG PO TABS: 10.0000 mg | ORAL_TABLET | Freq: Three times a day (TID) | ORAL | Status: DC | PRN

## 2018-12-05 MED ORDER — DEXTROSE 50 % IV SOLN: 50.0000 mL | Freq: Once | INTRAVENOUS | Status: DC

## 2018-12-05 MED ORDER — HEPARIN BOLUS VIA INFUSION
5000.0000 [IU] | Freq: Once | INTRAVENOUS | Status: DC
  Filled 2018-12-05: qty 5000

## 2018-12-05 MED ORDER — CARVEDILOL 25 MG PO TABS
25.0000 mg | ORAL_TABLET | Freq: Two times a day (BID) | ORAL | Status: DC
  Administered 2018-12-05 – 2018-12-07 (×5): 25 mg via ORAL
  Filled 2018-12-05 (×5): qty 1

## 2018-12-05 MED ORDER — CALCIUM CARBONATE ANTACID 500 MG PO CHEW: 1.0000 | CHEWABLE_TABLET | Freq: Three times a day (TID) | ORAL | Status: DC | PRN

## 2018-12-05 MED ORDER — FUROSEMIDE 10 MG/ML IJ SOLN
120.0000 mg | Freq: Two times a day (BID) | INTRAVENOUS | Status: DC
  Administered 2018-12-05 (×2): 120 mg via INTRAVENOUS
  Filled 2018-12-05 (×2): qty 10
  Filled 2018-12-05: qty 12

## 2018-12-05 MED ORDER — DEXTROSE 50 % IV SOLN: 50.0000 mL | INTRAVENOUS | Status: DC | PRN

## 2018-12-05 MED ORDER — ATORVASTATIN CALCIUM 10 MG PO TABS
10.0000 mg | ORAL_TABLET | Freq: Every day | ORAL | Status: DC
  Administered 2018-12-05 – 2018-12-07 (×3): 10 mg via ORAL
  Filled 2018-12-05 (×3): qty 1

## 2018-12-05 MED ORDER — NITROGLYCERIN 2 % TD OINT
1.0000 [in_us] | TOPICAL_OINTMENT | Freq: Four times a day (QID) | TRANSDERMAL | Status: DC
  Administered 2018-12-05 – 2018-12-07 (×10): 1 [in_us] via TOPICAL
  Filled 2018-12-05: qty 30
  Filled 2018-12-05: qty 1

## 2018-12-05 MED ORDER — FLUTICASONE PROPIONATE 50 MCG/ACT NA SUSP
1.0000 | Freq: Every day | NASAL | Status: DC
  Administered 2018-12-05 – 2018-12-06 (×3): 1 via NASAL
  Filled 2018-12-05: qty 16

## 2018-12-05 MED ORDER — SODIUM CHLORIDE 0.9 % IV SOLN
250.0000 mL | INTRAVENOUS | Status: DC | PRN
  Administered 2018-12-05: 250 mL via INTRAVENOUS

## 2018-12-05 MED ORDER — ASPIRIN EC 81 MG PO TBEC: 81.0000 mg | DELAYED_RELEASE_TABLET | Freq: Every day | ORAL | Status: DC

## 2018-12-05 MED ORDER — FUROSEMIDE 10 MG/ML IJ SOLN
40.0000 mg | Freq: Once | INTRAMUSCULAR | Status: AC
  Administered 2018-12-05: 40 mg via INTRAVENOUS
  Filled 2018-12-05: qty 4

## 2018-12-05 MED ORDER — HYDRALAZINE HCL 20 MG/ML IJ SOLN: 5.0000 mg | INTRAMUSCULAR | Status: DC | PRN

## 2018-12-05 MED ORDER — INSULIN ASPART 100 UNIT/ML ~~LOC~~ SOLN
0.0000 [IU] | Freq: Three times a day (TID) | SUBCUTANEOUS | Status: DC
  Administered 2018-12-05: 2 [IU] via SUBCUTANEOUS
  Administered 2018-12-05: 1 [IU] via SUBCUTANEOUS

## 2018-12-05 MED ORDER — ALBUTEROL SULFATE (2.5 MG/3ML) 0.083% IN NEBU: 2.5000 mg | INHALATION_SOLUTION | RESPIRATORY_TRACT | Status: DC | PRN

## 2018-12-05 MED ORDER — HEPARIN (PORCINE) 25000 UT/250ML-% IV SOLN: 1300.0000 [IU]/h | INTRAVENOUS | Status: DC

## 2018-12-05 MED ORDER — SODIUM CHLORIDE 0.9% FLUSH
3.0000 mL | Freq: Two times a day (BID) | INTRAVENOUS | Status: DC
  Administered 2018-12-05 – 2018-12-07 (×6): 3 mL via INTRAVENOUS

## 2018-12-05 MED ORDER — ACETAMINOPHEN 325 MG PO TABS: 650.0000 mg | ORAL_TABLET | ORAL | Status: DC | PRN

## 2018-12-05 MED ORDER — SODIUM CHLORIDE 0.9% FLUSH: 3.0000 mL | INTRAVENOUS | Status: DC | PRN

## 2018-12-05 MED ORDER — INSULIN ASPART 100 UNIT/ML ~~LOC~~ SOLN: 0.0000 [IU] | Freq: Every day | SUBCUTANEOUS | Status: DC

## 2018-12-05 MED ORDER — CALCITRIOL 0.25 MCG PO CAPS
0.2500 ug | ORAL_CAPSULE | Freq: Every day | ORAL | Status: DC
  Administered 2018-12-05 – 2018-12-07 (×3): 0.25 ug via ORAL
  Filled 2018-12-05 (×3): qty 1

## 2018-12-05 MED ORDER — GARLIC 100 MG PO TABS: 100.0000 mg | ORAL_TABLET | Freq: Every day | ORAL | Status: DC

## 2018-12-05 NOTE — ED Notes (Signed)
Attempted report x 1 to 3E, floor states unable to take report right now

## 2018-12-05 NOTE — Progress Notes (Deleted)
ANTICOAGULATION CONSULT NOTE - Initial Consult  Pharmacy Consult for heparin Indication: pulmonary embolus  Allergies  Allergen Reactions  . Codone [Hydrocodone] Itching  . Norvasc [Amlodipine Besylate] Swelling    Lower extremity?  . Sulfa Antibiotics Itching and Rash    Patient Measurements: Height: 5\' 4"  (162.6 cm) Weight: 180 lb (81.6 kg) IBW/kg (Calculated) : 54.7 Heparin Dosing Weight: 72.4 kg  Vital Signs: Temp: 97.7 F (36.5 C) (02/24 0023) BP: 158/85 (02/24 0203) Pulse Rate: 75 (02/24 0203)  Labs: Recent Labs    12/04/18 2337  HGB 8.0*  HCT 26.6*  PLT 144*  CREATININE 6.65*    Estimated Creatinine Clearance: 9.3 mL/min (A) (by C-G formula based on SCr of 6.65 mg/dL (H)).   Medical History: Past Medical History:  Diagnosis Date  . Allergy   . Anemia   . Anxiety   . Blood transfusion without reported diagnosis   . Cardiomyopathy ([place]) 11/2016   no ischemic eval due to CKD  . CHF (congestive heart failure) ([place])   . CKD (chronic kidney disease), stage IV ([place])   . Depression   . Diabetes mellitus without complication ([place])    type 2  . Dyspnea   . Hypertension   . Obesity     Medications:  See med rec  Assessment: 63 yo lady to start heparin for PE.  She was not on anticoagulation PTA.  Hg 8.0, PTLC 144 Goal of Therapy:  Heparin level 0.3-0.7 units/ml Monitor platelets by anticoagulation protocol: Yes   Plan:  Heparin 5000 unit bolus and drip at 1300 units/hr Check heparin level ~ 6 hours after start Daily HL and CBC while on heparin Monitor for bleeding complications  Thanks for allowing pharmacy to be a part of this patient's care.  Excell Seltzer, PharmD Clinical Pharmacist 12/05/2018,3:32 AM

## 2018-12-05 NOTE — ED Provider Notes (Signed)
[facility] EMERGENCY DEPARTMENT Provider Note   CSN: [id number] Arrival date & time: 12/04/18  2318    History   Chief Complaint Chief Complaint  Patient presents with  . Shortness of Breath  . Cough  . Chest Pain    HPI Patricia [name] is a 63 y.o. female.     Patient presents to the emergency department for evaluation of shortness of breath.  Patient does report that she has had some cough and congestion over the last couple of days.  She used a breathing treatment last night and it did not help.  Over the course of today she has noticed progressively worsening shortness of breath and a heaviness on her chest.  She notices that her breathing worsens if she tries to get up and walk and she cannot lie flat because of worsening shortness of breath.  Patient has a history of kidney disease, is scheduled to start peritoneal dialysis in the next few weeks.     Past Medical History:  Diagnosis Date  . Allergy   . Anemia   . Anxiety   . Blood transfusion without reported diagnosis   . Cardiomyopathy ([place]) 11/2016   no ischemic eval due to CKD  . CHF (congestive heart failure) ([place])   . CKD (chronic kidney disease), stage IV ([place])   . Depression   . Diabetes mellitus without complication ([place])    type 2  . Dyspnea   . Hypertension   . Obesity     Patient Active Problem List   Diagnosis Date Noted  . Fluid overload 12/05/2018  . Type II diabetes mellitus with renal manifestations ([place]) 12/05/2018  . Hypoglycemia 12/05/2018  . Acute on chronic combined systolic and diastolic CHF (congestive heart failure) ([place]) 06/14/2018  . CKD (chronic kidney disease) stage 5, GFR less than 15 ml/min (HCC) 06/14/2018  . Anemia due to stage 5 chronic kidney disease, not on chronic dialysis ([place]) 06/14/2018  . Systolic CHF ([place]) 55/97/4163  . Diabetes mellitus type 2 with complications ([place]) 84/53/6468  . Transaminasemia 11/29/2016  . Cardiomyopathy ([place]) 11/12/2016  .  Lipoma 10/21/2016  . CKD (chronic kidney disease) stage 4, GFR 15-29 ml/min (HCC) 08/10/2016  . Anxiety and depression 12/12/2015  . Diabetic nephropathy associated with type 2 diabetes mellitus ([place]) 11/15/2014  . Moderate nonproliferative diabetic retinopathy(362.05) 04/17/2014  . 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 Essential hypertension 03/28/2014  . Dental caries 03/28/2014  . Dyslipidemia 04/18/2013    Past Surgical History:  Procedure Laterality Date  . ABDOMINAL HYSTERECTOMY    . BREAST SURGERY     reduction  . [place]  . LIPOMA EXCISION     back  Dr. Excell Seltzer 03-22-18  . LIPOMA EXCISION N/A 03/22/2018   Procedure: EXCISION OF BACK LIPOMA;  Surgeon: Excell Seltzer, MD;  Location: [facility];  Service: General;  Laterality: N/A;  . REDUCTION MAMMAPLASTY Bilateral      OB History    Gravida  2   Para  2   Term  2   Preterm      AB      Living  2     SAB      TAB      Ectopic      Multiple      Live Births               Home Medications    Prior to Admission medications   Medication Sig Start  Date End Date Taking? Authorizing Provider  acetaminophen (TYLENOL) 500 MG tablet Take 500-1,000 mg by mouth every 6 (six) hours as needed (for pain or headaches).   Yes [provider]  albuterol (PROVENTIL HFA;VENTOLIN HFA) 108 (90 Base) MCG/ACT inhaler Inhale 1-2 puffs into the lungs every 6 (six) hours as needed for wheezing or shortness of breath. 10/11/18  Yes Marney Setting, NP  aspirin EC 81 MG tablet Take 1 tablet (81 mg total) by mouth daily. 01/04/17  Yes Langeland, Dawn T, MD  atorvastatin (LIPITOR) 20 MG tablet Take 0.5 tablets (10 mg total) by mouth daily. 07/15/18  Yes Ladell Pier, MD  Bee Pollen 550 MG CAPS Take 550 mg by mouth daily as needed (to boost the immune system).    Yes [provider]  bismuth subsalicylate (PEPTO BISMOL) 262 MG/15ML suspension Take 30 mLs by mouth every 6 (six)  hours as needed for indigestion.    Yes [provider]  calcitRIOL (ROCALTROL) 0.25 MCG capsule Take 0.25 mcg by mouth daily.  02/15/18  Yes [provider]  calcium carbonate (TUMS - DOSED IN MG ELEMENTAL CALCIUM) 500 MG chewable tablet Chew 1 tablet by mouth 3 (three) times daily as needed for indigestion or heartburn.    Yes [provider]  carvedilol (COREG) 25 MG tablet TAKE 1 TABLET BY MOUTH TWICE DAILY WITH A MEAL Patient taking differently: Take 25 mg by mouth 2 (two) times daily with a meal.  04/21/18  Yes Strader, [place], PA-C  escitalopram (LEXAPRO) 20 MG tablet TAKE 1 TABLET BY MOUTH AT BEDTIME. Patient taking differently: Take 20 mg by mouth at bedtime.  10/21/18  Yes Newlin, Charlane Ferretti, MD  fluticasone (FLONASE) 50 MCG/ACT nasal spray Place 1 spray into both nostrils daily. 07/02/18  Yes Jacqualine Mau, NP  furosemide (LASIX) 40 MG tablet TAKE 1 TABLET BY MOUTH 2 TIMES DAILY. Patient taking differently: Take 80 mg by mouth daily.  11/19/17  Yes Ladell Pier, MD  gabapentin (NEURONTIN) 100 MG capsule Take 1 capsule (100 mg total) by mouth at bedtime. 11/19/17  Yes Ladell Pier, MD  Garlic 350 MG TABS Take 100 mg by mouth daily.    Yes [provider]  glipiZIDE (GLUCOTROL) 10 MG tablet Take 1 tablet (10 mg total) by mouth daily before breakfast. 09/20/18  Yes Ladell Pier, MD  glucose 4 GM chewable tablet Chew 1 tablet by mouth as needed for low blood sugar.   Yes [provider]  hydrALAZINE (APRESOLINE) 50 MG tablet Take 1 tablet (50 mg total) by mouth 2 (two) times daily. 09/16/18  Yes Ladell Pier, MD  insulin aspart (NOVOLOG) 100 UNIT/ML injection 3 units before meals. Patient taking differently: Inject 5 Units into the skin 3 (three) times daily as needed (before meals for high blood sugar levels).  04/06/17  Yes Ladell Pier, MD  Insulin Glargine (LANTUS SOLOSTAR) 100 UNIT/ML Solostar Pen Inject 24 Units  into the skin at bedtime. 09/20/18  Yes Ladell Pier, MD  isosorbide mononitrate (IMDUR) 30 MG 24 hr tablet TAKE 1/2 TABLET BY MOUTH DAILY. Patient taking differently: Take 15 mg by mouth daily.  10/20/18  Yes Lendon Colonel, NP  RENAGEL 800 MG tablet Take 1,600 mg by mouth 3 (three) times daily with meals. 09/22/18  Yes [provider]  ACCU-CHEK FASTCLIX LANCETS MISC Uad tid 10/21/18   Ladell Pier, MD  Blood Glucose Monitoring Suppl (ACCU-CHEK GUIDE) w/Device KIT 1  each by Does not apply route 3 (three) times daily. Use tid as directed 10/21/18   Ladell Pier, MD  glucose blood (ACCU-CHEK GUIDE) test strip Use as instructed tid 10/21/18   Ladell Pier, MD  Insulin Pen Needle (TRUEPLUS PEN NEEDLES) 32G X 4 MM MISC Use as directed to inject insulin. 03/25/18   Ladell Pier, MD  Insulin Pen Needle 31G X 5 MM MISC Use as directed 03/28/18   Ladell Pier, MD  predniSONE (STERAPRED UNI-PAK 21 TAB) 10 MG (21) TBPK tablet Take by mouth daily. Take 6 tabs by mouth daily  for 2 days, then 5 tabs for 2 days, then 4 tabs for 2 days, then 3 tabs for 2 days, 2 tabs for 2 days, then 1 tab by mouth daily for 2 days Patient not taking: Reported on 10/18/2018 10/11/18   Marney Setting, NP    Family History Family History  Problem Relation Age of Onset  . Diabetes Brother   . Stomach cancer Brother   . Kidney disease Brother   . Heart Problems Maternal Grandmother        pacemaker  . Heart disease Maternal Grandfather   . Rectal cancer Neg Hx   . Esophageal cancer Neg Hx   . Liver cancer Neg Hx   . Colon cancer Neg Hx     Social History Social History   Tobacco Use  . Smoking status: Never Smoker  . Smokeless tobacco: Never Used  Substance Use Topics  . Alcohol use: No  . Drug use: No     Allergies   Codone [hydrocodone]; Norvasc [amlodipine besylate]; and Sulfa antibiotics   Review of Systems Review of Systems  Respiratory: Positive for  cough and shortness of breath.   Cardiovascular: Positive for chest pain.  All other systems reviewed and are negative.    Physical Exam Updated Vital Signs BP (!) 144/81 (BP Location: Right Arm)   Pulse 84   Temp 97.6 F (36.4 C) (Oral)   Resp (!) 24   Ht 5' 4"  (1.626 m)   Wt 81.5 kg   SpO2 100%   BMI 30.83 kg/m   Physical Exam Vitals signs and nursing note reviewed.  Constitutional:      General: She is not in acute distress.    Appearance: Normal appearance. She is well-developed.  HENT:     Head: Normocephalic and atraumatic.     Right Ear: Hearing normal.     Left Ear: Hearing normal.     Nose: Nose normal.  Eyes:     Conjunctiva/sclera: Conjunctivae normal.     Pupils: Pupils are equal, round, and reactive to light.  Neck:     Musculoskeletal: Normal range of motion and neck supple.     Vascular: JVD present.  Cardiovascular:     Rate and Rhythm: Regular rhythm.     Heart sounds: S1 normal and S2 normal. No murmur. No friction rub. No gallop.   Pulmonary:     Effort: Pulmonary effort is normal. No respiratory distress.     Breath sounds: Rales present.  Chest:     Chest wall: No tenderness.  Abdominal:     General: Bowel sounds are normal.     Palpations: Abdomen is soft.     Tenderness: There is no abdominal tenderness. There is no guarding or rebound. Negative signs include Murphy's sign and McBurney's sign.     Hernia: No hernia is present.  Musculoskeletal: Normal range of motion.  Right lower leg: Edema present.     Left lower leg: Edema present.  Skin:    General: Skin is warm and dry.     Findings: No rash.  Neurological:     Mental Status: She is alert and oriented to person, place, and time.     GCS: GCS eye subscore is 4. GCS verbal subscore is 5. GCS motor subscore is 6.     Cranial Nerves: No cranial nerve deficit.     Sensory: No sensory deficit.     Coordination: Coordination normal.  Psychiatric:        Speech: Speech normal.         Behavior: Behavior normal.        Thought Content: Thought content normal.      ED Treatments / Results  Labs (all labs ordered are listed, but only abnormal results are displayed) Labs Reviewed  BASIC METABOLIC PANEL - Abnormal; Notable for the following components:      Result Value   CO2 15 (*)    Glucose, Bld 67 (*)    BUN 74 (*)    Creatinine, Ser 6.65 (*)    Calcium 7.6 (*)    GFR calc non Af Amer 6 (*)    GFR calc Af Amer 7 (*)    All other components within normal limits  CBC - Abnormal; Notable for the following components:   RBC 2.81 (*)    Hemoglobin 8.0 (*)    HCT 26.6 (*)    RDW 17.1 (*)    Platelets 144 (*)    All other components within normal limits  BRAIN NATRIURETIC PEPTIDE - Abnormal; Notable for the following components:   B Natriuretic Peptide 1,901.1 (*)    All other components within normal limits  I-STAT BETA HCG BLOOD, ED ([facility], [facility], [facility] ONLY) - Abnormal; Notable for the following components:   I-stat hCG, quantitative 6.2 (*)    All other components within normal limits  CBG MONITORING, ED - Abnormal; Notable for the following components:   Glucose-Capillary 54 (*)    All other components within normal limits  CBG MONITORING, ED - Abnormal; Notable for the following components:   Glucose-Capillary 109 (*)    All other components within normal limits  HIV ANTIBODY (ROUTINE TESTING W REFLEX)  I-STAT TROPONIN, ED  CBG MONITORING, ED    EKG EKG Interpretation  Date/Time:  Monday December 05 2018 02:00:40 EST Ventricular Rate:  75 PR Interval:    QRS Duration: 92 QT Interval:  447 QTC Calculation: 500 R Axis:   63 Text Interpretation:  Sinus rhythm Nonspecific T abnormalities, lateral leads Borderline prolonged QT interval No significant change since last tracing Confirmed by Orpah Greek [phone]) on 12/05/2018 2:03:24 AM   Radiology Dg Chest 2 View  Result Date: 12/05/2018 CLINICAL DATA:  63 y/o F; productive cough, shortness of  breath, swelling of lower extremities. EXAM: CHEST - 2 VIEW COMPARISON:  10/11/2018 chest radiograph. FINDINGS: Stable cardiomegaly given projection and technique. Aortic atherosclerosis with calcification. Small pleural effusions. Increased reticular opacities of the lungs with basilar predominance. No pneumothorax. No acute osseous abnormality is evident. IMPRESSION: Stable cardiomegaly. Interstitial pulmonary edema and small pleural effusions. Electronically Signed   By: Kristine Garbe M.D.   On: 12/05/2018 00:28    Procedures Procedures (including critical care time)  Medications Ordered in ED Medications  sodium chloride flush (NS) 0.9 % injection 3 mL ( Intravenous Canceled Entry 12/05/18 0000)  nitroGLYCERIN (NITROGLYN) 2 %  ointment 1 inch (1 inch Topical Given 12/05/18 0624)  dextrose 50 % solution 50 mL (50 mLs Intravenous Not Given 12/05/18 0344)  dextrose 50 % solution 50 mL (has no administration in time range)  aspirin EC tablet 81 mg (has no administration in time range)  atorvastatin (LIPITOR) tablet 10 mg (has no administration in time range)  carvedilol (COREG) tablet 25 mg (has no administration in time range)  hydrALAZINE (APRESOLINE) tablet 50 mg (50 mg Oral Given 12/05/18 0513)  isosorbide mononitrate (IMDUR) 24 hr tablet 15 mg (has no administration in time range)  escitalopram (LEXAPRO) tablet 20 mg (has no administration in time range)  calcitRIOL (ROCALTROL) capsule 0.25 mcg (has no administration in time range)  bismuth subsalicylate (PEPTO BISMOL) 262 MG/15ML suspension 30 mL (has no administration in time range)  calcium carbonate (TUMS - dosed in mg elemental calcium) chewable tablet 200 mg of elemental calcium (has no administration in time range)  sevelamer carbonate (RENVELA) tablet 1,600 mg (has no administration in time range)  gabapentin (NEURONTIN) capsule 100 mg (has no administration in time range)  fluticasone (FLONASE) 50 MCG/ACT nasal spray 1  spray (has no administration in time range)  insulin aspart (novoLOG) injection 0-9 Units (has no administration in time range)  insulin aspart (novoLOG) injection 0-5 Units (has no administration in time range)  albuterol (PROVENTIL) (2.5 MG/3ML) 0.083% nebulizer solution 2.5 mg (has no administration in time range)  dextromethorphan-guaiFENesin (MUCINEX DM) 30-600 MG per 12 hr tablet 1 tablet (has no administration in time range)  sodium chloride flush (NS) 0.9 % injection 3 mL (3 mLs Intravenous Given 12/05/18 0514)  sodium chloride flush (NS) 0.9 % injection 3 mL (has no administration in time range)  0.9 %  sodium chloride infusion (has no administration in time range)  acetaminophen (TYLENOL) tablet 650 mg (has no administration in time range)  heparin injection 5,000 Units (5,000 Units Subcutaneous Given 12/05/18 0513)  furosemide (LASIX) 120 mg in dextrose 5 % 50 mL IVPB (has no administration in time range)  hydrALAZINE (APRESOLINE) injection 5 mg (has no administration in time range)  hydrOXYzine (ATARAX/VISTARIL) tablet 10 mg (has no administration in time range)  furosemide (LASIX) injection 80 mg (80 mg Intravenous Given 12/05/18 0213)  aspirin chewable tablet 324 mg (324 mg Oral Given 12/05/18 0213)  furosemide (LASIX) injection 40 mg (40 mg Intravenous Given 12/05/18 0348)     Initial Impression / Assessment and Plan / ED Course  I have reviewed the triage vital signs and the nursing notes.  Pertinent labs & imaging results that were available during my care of the patient were reviewed by me and considered in my medical decision making (see chart for details).        Patient presents to the emergency department for evaluation of difficulty breathing.  Patient has a history of CHF and end-stage renal disease.  She is scheduled to start peritoneal dialysis in the next couple of weeks.  Patient has had worsening of her edema, shortness of breath, dyspnea on exertion, orthopnea  over the last 2 days.  Examination is consistent with volume overload.  Patient does still make urine, will initiate IV diuresis and admit to hospitalist.  Final Clinical Impressions(s) / ED Diagnoses   Final diagnoses:  Acute on chronic congestive heart failure, unspecified heart failure type [facility])    ED Discharge Orders    None       Orpah Greek, MD 12/05/18 [phone]

## 2018-12-05 NOTE — Progress Notes (Signed)
Echocardiogram 2D Echocardiogram has been performed.  Patricia [name] 12/05/2018, 8:57 AM

## 2018-12-05 NOTE — ED Notes (Signed)
Pt remains in waiting room. Updated on wait for treatment room. 

## 2018-12-05 NOTE — Progress Notes (Signed)
Patient is a 63 year old African-American female with CKD 5 that is nearing end-stage renal disease. Patient is known to Dr. Joelyn Oms. As per the patient, PD is planned. Patient also tells me that the plan was to have PD catheter placed in 2 days time. Patient was admitted with pulmonary edema/CHF/volume overload. Will consult nephrology. Patient is on IV Lasix.  As per H&P done earlier today "Patricia [name] is a 63 y.o. female with medical history significant of CKD-5, hypertension, hyperlipidemia, diabetes mellitus, anemia, CHF with EF of 45%, obesity, depression with anxiety, who presents with shortness of breath.  Patient states that she has been having shortness of breath for almost 3 weeks, which has been progressively worsening.  She has cough with clear mucus production.  She also has bilateral leg edema.  She does not have chest pain, but reports that she has chest pressure when laying down.  She has orthopnea.  No fever, but has chills.  Patient denies nausea, vomiting, diarrhea, abdominal pain, symptoms of UTI.  Patient states she has decreased urine output recently.  Patient states that she is taking Lasix 80 mg daily, today she took extra dose of Lasix without help. Pt states her renal doctor, Dr. Joelyn Oms is planing to start peritoneal dialysis soon.  ED Course: pt was found to have negative troponin, WBC 7.3, renal function close to baseline, potassium 4.0, blood sugar 67, temperature normal, no tachycardia, no tachypnea, oxygen saturation 99% on room air, chest x-ray showed interstitial pulmonary edema and cardiomegaly with a small pleural effusion.  Patient is placed on telemetry bed for observation".  Other management will depend on hospital course. Consulted nephrology, discussed with Dr. Marvene Staff.

## 2018-12-05 NOTE — Care Management Note (Signed)
Case Management Note  Patient Details  Name: Patricia [name] MRN: [id number] Date of Birth: 1956-07-22  Subjective/Objective:    CHF               Action/Plan: Patient lives at home; PCP: Ladell Pier, MD; has private insurance with Medicaid with prescription drug coverage; CM following for progression of care. Patient is independent prior to admission.  Expected Discharge Date:   possiblyy 2/28/020               Expected Discharge Plan:  Home/Self Care  Discharge planning Services  CM Consult  Status of Service:  In process, will continue to follow  Sherrilyn Rist [phone] 12/05/2018, 8:26 AM

## 2018-12-05 NOTE — H&P (Signed)
History and Physical    Patricia [name] DZH:[id number] DOB: 1956-07-23 DOA: 12/04/2018  Referring MD/NP/PA:   PCP: Ladell Pier, MD   Patient coming from:  The patient is coming from home.  At baseline, pt is independent for most of ADL.        Chief Complaint: Shortness of breath  HPI: Patricia [name] is a 63 y.o. female with medical history significant of CKD-5, hypertension, hyperlipidemia, diabetes mellitus, anemia, CHF with EF of 45%, obesity, depression with anxiety, who presents with shortness of breath.  Patient states that she has been having shortness of breath for almost 3 weeks, which has been progressively worsening.  She has cough with clear mucus production.  She also has bilateral leg edema.  She does not have chest pain, but reports that she has chest pressure when laying down.  She has orthopnea.  No fever, but has chills.  Patient denies nausea, vomiting, diarrhea, abdominal pain, symptoms of UTI.  Patient states she has decreased urine output recently.  Patient states that she is taking Lasix 80 mg daily, today she took extra dose of Lasix without help. Pt states her renal doctor, Dr. Joelyn Oms is planing to start peritoneal dialysis soon.  ED Course: pt was found to have negative troponin, WBC 7.3, renal function close to baseline, potassium 4.0, blood sugar 67, temperature normal, no tachycardia, no tachypnea, oxygen saturation 99% on room air, chest x-ray showed interstitial pulmonary edema and cardiomegaly with a small pleural effusion.  Patient is placed on telemetry bed for observation.  Review of Systems:   General: no fevers, chills, has fatigue HEENT: no blurry vision, hearing changes or sore throat Respiratory: has dyspnea, coughing, no wheezing CV: no chest pain, no palpitations GI: no nausea, vomiting, abdominal pain, diarrhea, constipation GU: no dysuria, burning on urination, increased urinary frequency, hematuria  Ext: has leg edema Neuro: no unilateral  weakness, numbness, or tingling, no vision change or hearing loss Skin: no rash, no skin tear. MSK: No muscle spasm, no deformity, no limitation of range of movement in spin Heme: No easy bruising.  Travel history: No recent long distant travel.  Allergy:  Allergies  Allergen Reactions  . Codone [Hydrocodone] Itching  . Norvasc [Amlodipine Besylate] Swelling    Lower extremity?  . Sulfa Antibiotics Itching and Rash    Past Medical History:  Diagnosis Date  . Allergy   . Anemia   . Anxiety   . Blood transfusion without reported diagnosis   . Cardiomyopathy ([place]) 11/2016   no ischemic eval due to CKD  . CHF (congestive heart failure) ([place])   . CKD (chronic kidney disease), stage IV ([place])   . Depression   . Diabetes mellitus without complication ([place])    type 2  . Dyspnea   . Hypertension   . Obesity     Past Surgical History:  Procedure Laterality Date  . ABDOMINAL HYSTERECTOMY    . BREAST SURGERY     reduction  . [place]  . LIPOMA EXCISION     back  Dr. Excell Seltzer 03-22-18  . LIPOMA EXCISION N/A 03/22/2018   Procedure: EXCISION OF BACK LIPOMA;  Surgeon: Excell Seltzer, MD;  Location: [facility];  Service: General;  Laterality: N/A;  . REDUCTION MAMMAPLASTY Bilateral     Social History:  reports that she has never smoked. She has never used smokeless tobacco. She reports that she does not drink alcohol or use drugs.  Family History:  Family History  Problem Relation Age of Onset  . Diabetes Brother   . Stomach cancer Brother   . Kidney disease Brother   . Heart Problems Maternal Grandmother        pacemaker  . Heart disease Maternal Grandfather   . Rectal cancer Neg Hx   . Esophageal cancer Neg Hx   . Liver cancer Neg Hx   . Colon cancer Neg Hx      Prior to Admission medications   Medication Sig Start Date End Date Taking? Authorizing Provider  acetaminophen (TYLENOL) 500 MG tablet Take 500-1,000 mg by mouth every 6 (six) hours as  needed (for pain or headaches).   Yes [provider]  albuterol (PROVENTIL HFA;VENTOLIN HFA) 108 (90 Base) MCG/ACT inhaler Inhale 1-2 puffs into the lungs every 6 (six) hours as needed for wheezing or shortness of breath. 10/11/18  Yes Marney Setting, NP  aspirin EC 81 MG tablet Take 1 tablet (81 mg total) by mouth daily. 01/04/17  Yes Langeland, Dawn T, MD  atorvastatin (LIPITOR) 20 MG tablet Take 0.5 tablets (10 mg total) by mouth daily. 07/15/18  Yes Ladell Pier, MD  Bee Pollen 550 MG CAPS Take 550 mg by mouth daily as needed (to boost the immune system).    Yes [provider]  bismuth subsalicylate (PEPTO BISMOL) 262 MG/15ML suspension Take 30 mLs by mouth every 6 (six) hours as needed for indigestion.    Yes [provider]  calcitRIOL (ROCALTROL) 0.25 MCG capsule Take 0.25 mcg by mouth daily.  02/15/18  Yes [provider]  calcium carbonate (TUMS - DOSED IN MG ELEMENTAL CALCIUM) 500 MG chewable tablet Chew 1 tablet by mouth 3 (three) times daily as needed for indigestion or heartburn.    Yes [provider]  carvedilol (COREG) 25 MG tablet TAKE 1 TABLET BY MOUTH TWICE DAILY WITH A MEAL Patient taking differently: Take 25 mg by mouth 2 (two) times daily with a meal.  04/21/18  Yes Strader, [place], PA-C  escitalopram (LEXAPRO) 20 MG tablet TAKE 1 TABLET BY MOUTH AT BEDTIME. Patient taking differently: Take 20 mg by mouth at bedtime.  10/21/18  Yes Newlin, Charlane Ferretti, MD  fluticasone (FLONASE) 50 MCG/ACT nasal spray Place 1 spray into both nostrils daily. 07/02/18  Yes Jacqualine Mau, NP  furosemide (LASIX) 40 MG tablet TAKE 1 TABLET BY MOUTH 2 TIMES DAILY. Patient taking differently: Take 80 mg by mouth daily.  11/19/17  Yes Ladell Pier, MD  gabapentin (NEURONTIN) 100 MG capsule Take 1 capsule (100 mg total) by mouth at bedtime. 11/19/17  Yes Ladell Pier, MD  Garlic 616 MG TABS Take 100 mg by mouth daily.    Yes [provider]  glipiZIDE (GLUCOTROL) 10 MG tablet Take 1 tablet (10 mg total) by mouth daily before breakfast. 09/20/18  Yes Ladell Pier, MD  glucose 4 GM chewable tablet Chew 1 tablet by mouth as needed for low blood sugar.   Yes [provider]  hydrALAZINE (APRESOLINE) 50 MG tablet Take 1 tablet (50 mg total) by mouth 2 (two) times daily. 09/16/18  Yes Ladell Pier, MD  insulin aspart (NOVOLOG) 100 UNIT/ML injection 3 units before meals. Patient taking differently: Inject 5 Units into the skin 3 (three) times daily as needed (before meals for high blood sugar levels).  04/06/17  Yes Ladell Pier, MD  Insulin Glargine (LANTUS SOLOSTAR) 100 UNIT/ML Solostar Pen Inject 24 Units into the skin at bedtime. 09/20/18  Yes Ladell Pier, MD  isosorbide mononitrate (IMDUR) 30 MG 24 hr tablet TAKE 1/2 TABLET BY MOUTH DAILY. Patient taking differently: Take 15 mg by mouth daily.  10/20/18  Yes Lendon Colonel, NP  RENAGEL 800 MG tablet Take 1,600 mg by mouth 3 (three) times daily with meals. 09/22/18  Yes [provider]  ACCU-CHEK FASTCLIX LANCETS MISC Uad tid 10/21/18   Ladell Pier, MD  Blood Glucose Monitoring Suppl (ACCU-CHEK GUIDE) w/Device KIT 1 each by Does not apply route 3 (three) times daily. Use tid as directed 10/21/18   Ladell Pier, MD  glucose blood (ACCU-CHEK GUIDE) test strip Use as instructed tid 10/21/18   Ladell Pier, MD  Insulin Pen Needle (TRUEPLUS PEN NEEDLES) 32G X 4 MM MISC Use as directed to inject insulin. 03/25/18   Ladell Pier, MD  Insulin Pen Needle 31G X 5 MM MISC Use as directed 03/28/18   Ladell Pier, MD  predniSONE (STERAPRED UNI-PAK 21 TAB) 10 MG (21) TBPK tablet Take by mouth daily. Take 6 tabs by mouth daily  for 2 days, then 5 tabs for 2 days, then 4 tabs for 2 days, then 3 tabs for 2 days, 2 tabs for 2 days, then 1 tab by mouth daily for 2 days Patient not taking: Reported on 10/18/2018 10/11/18    Marney Setting, NP    Physical Exam: Vitals:   12/05/18 0400 12/05/18 0415 12/05/18 0430 12/05/18 0439  BP: (!) 143/86 (!) 162/85  (!) 144/81  Pulse: 86 84  84  Resp: (!) 31 14  (!) 24  Temp:    97.6 F (36.4 C)  TempSrc:    Oral  SpO2: 97% 97%  100%  Weight:   81.5 kg   Height:   _0  (1.626 m)    General: Not in acute distress HEENT:       Eyes: PERRL, EOMI, no scleral icterus.       ENT: No discharge from the ears and nose, no pharynx injection, no tonsillar enlargement.        Neck: positive JVD, no bruit, no mass felt. Heme: No neck lymph node enlargement. Cardiac: S1/S2, RRR, No murmurs, No gallops or rubs. Respiratory:  No rales, wheezing, rhonchi or rubs. GI: Soft, nondistended, nontender, no rebound pain, no organomegaly, BS present. GU: No hematuria Ext: 2+ pitting leg edema bilaterally. 2+DP/PT pulse bilaterally. Musculoskeletal: No joint deformities, No joint redness or warmth, no limitation of ROM in spin. Skin: No rashes.  Neuro: Alert, oriented X3, cranial nerves II-XII grossly intact, moves all extremities normally. Psych: Patient is not psychotic, no suicidal or hemocidal ideation.  Labs on Admission: I have personally reviewed following labs and imaging studies  CBC: Recent Labs  Lab 12/04/18 2337  WBC 7.3  HGB 8.0*  HCT 26.6*  MCV 94.7  PLT 329*   Basic Metabolic Panel: Recent Labs  Lab 12/04/18 2337  NA 138  K 4.0  CL 111  CO2 15*  GLUCOSE 67*  BUN 74*  CREATININE 6.65*  CALCIUM 7.6*   GFR: Estimated Creatinine Clearance: 9.3 mL/min (A) (by C-G formula based on SCr of 6.65 mg/dL (H)). Liver Function Tests: No results for input(s): AST, ALT, ALKPHOS, BILITOT, PROT, ALBUMIN in the last 168 hours. No results for input(s): LIPASE, AMYLASE in the last 168 hours. No results for input(s): AMMONIA in the last 168 hours. Coagulation Profile: No results for input(s): INR, PROTIME in the last 168 hours. Cardiac Enzymes:  No results for  input(s): CKTOTAL, CKMB, CKMBINDEX, TROPONINI in the last 168 hours. BNP (last 3 results) No results for input(s): PROBNP in the last 8760 hours. HbA1C: No results for input(s): HGBA1C in the last 72 hours. CBG: Recent Labs  Lab 12/05/18 0245 12/05/18 0335  GLUCAP 54* 109*   Lipid Profile: No results for input(s): CHOL, HDL, LDLCALC, TRIG, CHOLHDL, LDLDIRECT in the last 72 hours. Thyroid Function Tests: No results for input(s): TSH, T4TOTAL, FREET4, T3FREE, THYROIDAB in the last 72 hours. Anemia Panel: No results for input(s): VITAMINB12, FOLATE, FERRITIN, TIBC, IRON, RETICCTPCT in the last 72 hours. Urine analysis:    Component Value Date/Time   COLORURINE YELLOW 06/02/2018 1713   APPEARANCEUR HAZY (A) 06/02/2018 1713   LABSPEC 1.012 06/02/2018 1713   PHURINE 5.0 06/02/2018 1713   GLUCOSEU 50 (A) 06/02/2018 1713   HGBUR NEGATIVE 06/02/2018 1713   BILIRUBINUR NEGATIVE 06/02/2018 1713   BILIRUBINUR 1.0 10/09/2016 1306   KETONESUR NEGATIVE 06/02/2018 1713   PROTEINUR >=300 (A) 06/02/2018 1713   UROBILINOGEN 0.2 04/24/2018 1529   NITRITE NEGATIVE 06/02/2018 1713   LEUKOCYTESUR NEGATIVE 06/02/2018 1713   Sepsis Labs: _0 (procalcitonin:4,lacticidven:4) )No results found for this or any previous visit (from the past 240 hour(s)).   Radiological Exams on Admission: Dg Chest 2 View  Result Date: 12/05/2018 CLINICAL DATA:  63 y/o F; productive cough, shortness of breath, swelling of lower extremities. EXAM: CHEST - 2 VIEW COMPARISON:  10/11/2018 chest radiograph. FINDINGS: Stable cardiomegaly given projection and technique. Aortic atherosclerosis with calcification. Small pleural effusions. Increased reticular opacities of the lungs with basilar predominance. No pneumothorax. No acute osseous abnormality is evident. IMPRESSION: Stable cardiomegaly. Interstitial pulmonary edema and small pleural effusions. Electronically Signed   By: Kristine Garbe M.D.   On:  12/05/2018 00:28     EKG: Independently reviewed.  Sinus rhythm, QTC 500, LAD, anteroseptal infarction pattern, nonspecific to 10.   Assessment/Plan Principal Problem:   Fluid overload Active Problems:   Dyslipidemia   Essential hypertension   Anxiety and depression   Acute on chronic combined systolic and diastolic CHF (congestive heart failure) (HCC)   CKD (chronic kidney disease) stage 5, GFR less than 15 ml/min (HCC)   Anemia due to stage 5 chronic kidney disease, not on chronic dialysis ([facility])   Type II diabetes mellitus with renal manifestations ([facility])   Hypoglycemia   Fluid overload and acute on chronic combined systolic and diastolic CHF: pt has 2+ leg edema, positive JVD, orthopnea, pulmonary edema chest x-ray, consistent with fluid overload and CHF exacerbation.  Patient has stage V CKD, which has obviously contributed.  2D echo on 05/27/2018 showed EF of 45-50% with grade 1 diastolic dysfunction.  -will place on tele bed for obs -Lasix 120 mg bid by IV (pt received 80+40 mg Lasix tonight) -2d echo -Daily weights -strict I/O's -Low salt diet -Fluid restriction -Patient was given1 nitroglycerin patch in ED  CKD (chronic kidney disease) stage 5, GFR less than 15 ml/min ([place]): Recent creatinine 6.0-7.0.  Her creatinine is 6.65 and BUN 74 today, no significant worsening.  Patient is close to the point starting dialysis.  Patient states her doctor, Dr. Joelyn Oms is planning to start peri-dialysis center for her. -Follow-up with renal  Dyslipidemia: -lipitor  HTN:  -Continue home medications: Hydralazine, Coreg -IV hydralazine prn  Anxiety and depression: -on Lexapro  Anemia due to stage 5 chronic kidney disease, not on chronic dialysis [facility]): hgb stable.  8.0 on 11/15/2018--> 8.0 today -Follow-up with CBC  Type II diabetes mellitus with renal manifestations ([place]): Last A1c 6.3 on 07/15/18, well controled. Patient is taking NovoLog, glipizide and Lantus at home.  Patient  has hypoglycemia with blood sugar 67 on admission -Hold home glipizide, NovoLog and Lantus dose from   -SSI -PRN D50  Hypoglycemia: -prn D50 -check CBG q2h x 2   DVT ppx: SQ Heparin    Code Status: Full code Family Communication:  Yes, patient's  daughter   at bed side Disposition Plan:  Anticipate discharge back to previous home environment Consults called:  none Admission status: Obs / tele    Date of Service 12/05/2018    [facility]   If 7PM-7AM, please contact night-coverage [email or web address] Password [facility] 12/05/2018, 4:43 AM

## 2018-12-05 NOTE — ED Notes (Signed)
Pt's CBG noted to be 54 mg/dL. Four cheese sticks provided, crackers, and graham crackers. Food being ordered.

## 2018-12-05 NOTE — ED Notes (Signed)
CBG 109, pt given [place] sandwich

## 2018-12-05 NOTE — Consult Note (Signed)
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 female.  HPI: Patricia [name] has a PMH significant for HTN, HLD, CHF with EF of 45%, DM, and progressive CKD stage 5 who was admitted 12/04/18 with worsening lower extremity edema and shortness of breath.  She reports orthopnea and DOE.  In the ED she was found to have pulmonary edema on CXR but pulse ox was 99% on room air.  She admits to some anorexia and was in the process of having a PD catheter placement to start PD training.  She is followed closely at our office by Dr. Joelyn [name] who saw her on 11/15/18 and noted that she was having some uremic symptoms and she was amenable to initiate PD once her catheter was placed.  Unfortunately her PD catheter consultation is scheduled on 12/07/18 so has no access.  She reports that her respiratory issues started about a month ago and was started on prednisone for a bronchitis and gained about 10 lbs after starting this.  She feels better after receiving IV lasix but only 500 cc's of urine output was recorded.  We were consulted to help manage her progressive CKD and CHF.  She is currently without N/V/SOB or chest pain.   Of note, she is followed by Cardiology who was planning a left heart cath once she was started on RRT.   Her Cr has actually improved overnight and the trend is seen below.   Trend in Creatinine: Creatinine, Ser  Date/Time Value Ref Range Status  12/04/2018 11:37 PM 6.65 (H) 0.44 - 1.00 mg/dL Final  10/25/2018 08:37 AM 7.66 (H) 0.44 - 1.00 mg/dL Final  08/30/2018 12:43 PM 6.74 (H) 0.44 - 1.00 mg/dL Final  07/19/2018 11:07 AM 6.05 (H) 0.44 - 1.00 mg/dL Final  06/07/2018 11:19 AM 6.17 (H) 0.44 - 1.00 mg/dL Final  06/02/2018 12:18 PM 6.27 (H) 0.44 - 1.00 mg/dL Final  05/23/2018 04:07 PM 5.80 (H) 0.57 - 1.00 mg/dL Final  04/12/2018 11:02 AM 6.66 (H) 0.44 - 1.00 mg/dL Final  03/15/2018 11:12 AM 5.87 (H) 0.44 - 1.00 mg/dL Final   01/18/2018 09:36 AM 4.84 (H) 0.44 - 1.00 mg/dL Final  12/21/2017 11:25 AM 4.81 (H) 0.44 - 1.00 mg/dL Final  11/23/2017 10:00 AM 3.67 (H) 0.44 - 1.00 mg/dL Final  11/04/2017 06:30 PM 4.09 (H) 0.44 - 1.00 mg/dL Final  10/26/2017 09:57 AM 3.78 (H) 0.44 - 1.00 mg/dL Final  09/29/2017 10:00 AM 3.83 (H) 0.44 - 1.00 mg/dL Final  09/01/2017 09:47 AM 3.33 (H) 0.44 - 1.00 mg/dL Final  08/04/2017 09:58 AM 2.96 (H) 0.44 - 1.00 mg/dL Final  07/08/2017 10:30 AM 2.73 (H) 0.44 - 1.00 mg/dL Final  06/07/2017 11:28 AM 3.17 (H) 0.44 - 1.00 mg/dL Final  01/30/2017 04:19 PM 2.75 (H) 0.44 - 1.00 mg/dL Final  12/02/2016 05:38 AM 2.52 (H) 0.44 - 1.00 mg/dL Final  12/01/2016 06:16 AM 2.59 (H) 0.44 - 1.00 mg/dL Final  11/30/2016 04:37 AM 2.35 (H) 0.44 - 1.00 mg/dL Final  11/29/2016 03:25 AM 2.26 (H) 0.44 - 1.00 mg/dL Final  11/01/2016 05:53 PM 1.86 (H) 0.44 - 1.00 mg/dL Final  11/04/2014 06:57 PM 2.03 (H) 0.50 - 1.10 mg/dL Final  05/26/2013 05:08 PM 0.76 0.50 - 1.10 mg/dL Final  02/08/2013 06:02 PM 0.75 0.50 - 1.10 mg/dL Final  03/28/2008 03:58 PM 0.5  Final  03/28/2008 03:47 PM 0.57  Final  07/20/2007 12:30 PM 0.8  Final    PMH:   Past Medical History:  Diagnosis Date  . Allergy   . Anemia   . Anxiety   . Blood transfusion without reported diagnosis   . Cardiomyopathy ([place]) 11/2016   no ischemic eval due to CKD  . CHF (congestive heart failure) ([place])   . CKD (chronic kidney disease), stage IV ([place])   . Depression   . Diabetes mellitus without complication ([place])    type 2  . Dyspnea   . Hypertension   . Obesity     PSH:   Past Surgical History:  Procedure Laterality Date  . ABDOMINAL HYSTERECTOMY    . BREAST SURGERY     reduction  . [place]  . LIPOMA EXCISION     back  Dr. Excell Seltzer 03-22-18  . LIPOMA EXCISION N/A 03/22/2018   Procedure: EXCISION OF BACK LIPOMA;  Surgeon: Excell Seltzer, MD;  Location: [facility];  Service: General;  Laterality: N/A;  . REDUCTION  MAMMAPLASTY Bilateral     Allergies:  Allergies  Allergen Reactions  . Codone [Hydrocodone] Itching  . Norvasc [Amlodipine Besylate] Swelling    Lower extremity?  . Sulfa Antibiotics Itching and Rash    Medications:   Prior to Admission medications   Medication Sig Start Date End Date Taking? Authorizing Provider  acetaminophen (TYLENOL) 500 MG tablet Take 500-1,000 mg by mouth every 6 (six) hours as needed (for pain or headaches).   Yes [provider]  albuterol (PROVENTIL HFA;VENTOLIN HFA) 108 (90 Base) MCG/ACT inhaler Inhale 1-2 puffs into the lungs every 6 (six) hours as needed for wheezing or shortness of breath. 10/11/18  Yes Marney Setting, NP  aspirin EC 81 MG tablet Take 1 tablet (81 mg total) by mouth daily. 01/04/17  Yes Langeland, Dawn T, MD  atorvastatin (LIPITOR) 20 MG tablet Take 0.5 tablets (10 mg total) by mouth daily. 07/15/18  Yes Ladell Pier, MD  Bee Pollen 550 MG CAPS Take 550 mg by mouth daily as needed (to boost the immune system).    Yes [provider]  bismuth subsalicylate (PEPTO BISMOL) 262 MG/15ML suspension Take 30 mLs by mouth every 6 (six) hours as needed for indigestion.    Yes [provider]  calcitRIOL (ROCALTROL) 0.25 MCG capsule Take 0.25 mcg by mouth daily.  02/15/18  Yes [provider]  calcium carbonate (TUMS - DOSED IN MG ELEMENTAL CALCIUM) 500 MG chewable tablet Chew 1 tablet by mouth 3 (three) times daily as needed for indigestion or heartburn.    Yes [provider]  carvedilol (COREG) 25 MG tablet TAKE 1 TABLET BY MOUTH TWICE DAILY WITH A MEAL Patient taking differently: Take 25 mg by mouth 2 (two) times daily with a meal.  04/21/18  Yes Strader, [place], PA-C  escitalopram (LEXAPRO) 20 MG tablet TAKE 1 TABLET BY MOUTH AT BEDTIME. Patient taking differently: Take 20 mg by mouth at bedtime.  10/21/18  Yes Newlin, Charlane Ferretti, MD  fluticasone (FLONASE) 50 MCG/ACT nasal spray Place 1 spray into  both nostrils daily. 07/02/18  Yes Jacqualine Mau, NP  furosemide (LASIX) 40 MG tablet TAKE 1 TABLET BY MOUTH 2 TIMES DAILY. Patient taking differently: Take 80 mg by mouth daily.  11/19/17  Yes Ladell Pier, MD  gabapentin (NEURONTIN) 100 MG capsule Take 1 capsule (100 mg total) by mouth at bedtime. 11/19/17  Yes Ladell Pier, MD  Garlic 161 MG TABS Take 100 mg by mouth daily.  Yes [provider]  glipiZIDE (GLUCOTROL) 10 MG tablet Take 1 tablet (10 mg total) by mouth daily before breakfast. 09/20/18  Yes Ladell Pier, MD  glucose 4 GM chewable tablet Chew 1 tablet by mouth as needed for low blood sugar.   Yes [provider]  hydrALAZINE (APRESOLINE) 50 MG tablet Take 1 tablet (50 mg total) by mouth 2 (two) times daily. 09/16/18  Yes Ladell Pier, MD  insulin aspart (NOVOLOG) 100 UNIT/ML injection 3 units before meals. Patient taking differently: Inject 5 Units into the skin 3 (three) times daily as needed (before meals for high blood sugar levels).  04/06/17  Yes Ladell Pier, MD  Insulin Glargine (LANTUS SOLOSTAR) 100 UNIT/ML Solostar Pen Inject 24 Units into the skin at bedtime. 09/20/18  Yes Ladell Pier, MD  isosorbide mononitrate (IMDUR) 30 MG 24 hr tablet TAKE 1/2 TABLET BY MOUTH DAILY. Patient taking differently: Take 15 mg by mouth daily.  10/20/18  Yes Lendon Colonel, NP  RENAGEL 800 MG tablet Take 1,600 mg by mouth 3 (three) times daily with meals. 09/22/18  Yes [provider]  ACCU-CHEK FASTCLIX LANCETS MISC Uad tid 10/21/18   Ladell Pier, MD  Blood Glucose Monitoring Suppl (ACCU-CHEK GUIDE) w/Device KIT 1 each by Does not apply route 3 (three) times daily. Use tid as directed 10/21/18   Ladell Pier, MD  glucose blood (ACCU-CHEK GUIDE) test strip Use as instructed tid 10/21/18   Ladell Pier, MD  Insulin Pen Needle (TRUEPLUS PEN NEEDLES) 32G X 4 MM MISC Use as directed to inject insulin. 03/25/18    Ladell Pier, MD  Insulin Pen Needle 31G X 5 MM MISC Use as directed 03/28/18   Ladell Pier, MD  predniSONE (STERAPRED UNI-PAK 21 TAB) 10 MG (21) TBPK tablet Take by mouth daily. Take 6 tabs by mouth daily  for 2 days, then 5 tabs for 2 days, then 4 tabs for 2 days, then 3 tabs for 2 days, 2 tabs for 2 days, then 1 tab by mouth daily for 2 days Patient not taking: Reported on 10/18/2018 10/11/18   Marney Setting, NP    Inpatient medications: . aspirin EC  81 mg Oral Daily  . atorvastatin  10 mg Oral Daily  . calcitRIOL  0.25 mcg Oral Daily  . carvedilol  25 mg Oral BID WC  . dextrose  50 mL Intravenous Once  . escitalopram  20 mg Oral QHS  . fluticasone  1 spray Each Nare Daily  . gabapentin  100 mg Oral QHS  . heparin  5,000 Units Subcutaneous Q8H  . hydrALAZINE  50 mg Oral BID  . insulin aspart  0-5 Units Subcutaneous QHS  . insulin aspart  0-9 Units Subcutaneous TID WC  . isosorbide mononitrate  15 mg Oral Daily  . nitroGLYCERIN  1 inch Topical Q6H  . sevelamer carbonate  1,600 mg Oral TID WC  . sodium chloride flush  3 mL Intravenous Once  . sodium chloride flush  3 mL Intravenous Q12H    Discontinued Meds:   Medications Discontinued During This Encounter  Medication Reason  . aspirin EC tablet 81 mg   . heparin ADULT infusion 100 units/mL (25000 units/225m sodium chloride 0.45%)   . heparin bolus via infusion 5,000 Units   . Garlic TABS 1222mg P&T Policy: Herbal Product     Social History:  reports that she has never smoked. She has never used smokeless tobacco.  She reports that she does not drink alcohol or use drugs.  Family History:   Family History  Problem Relation Age of Onset  . Diabetes Brother   . Stomach cancer Brother   . Kidney disease Brother   . Heart Problems Maternal Grandmother        pacemaker  . Heart disease Maternal Grandfather   . Rectal cancer Neg Hx   . Esophageal cancer Neg Hx   . Liver cancer Neg Hx   . Colon cancer Neg  Hx     Pertinent items are noted in HPI. Weight change:   Intake/Output Summary (Last 24 hours) at 12/05/2018 1615 Last data filed at 12/05/2018 1500 Gross per 24 hour  Intake 200 ml  Output 501 ml  Net -301 ml   BP 136/77 (BP Location: Right Arm)   Pulse 77   Temp 97.7 F (36.5 C) (Oral)   Resp 19   Ht _0  (1.626 m)   Wt 81.5 kg   SpO2 100%   BMI 30.83 kg/m  Vitals:   12/05/18 0430 12/05/18 0439 12/05/18 0921 12/05/18 1227  BP:  (!) 144/81 (!) 146/78 136/77  Pulse:  84 89 77  Resp:  (!) 24  19  Temp:  97.6 F (36.4 C)  97.7 F (36.5 C)  TempSrc:  Oral  Oral  SpO2:  100% 100% 100%  Weight: 81.5 kg     Height: _1  (1.626 m)        General appearance: alert, cooperative and no distress Head: Normocephalic, without obvious abnormality, atraumatic Eyes: negative findings: lids and lashes normal, conjunctivae and sclerae normal and corneas clear Resp: rales bibasilar and wheezes occassional bilateral expiratory wheezes Cardio: regular rate and rhythm and no rub GI: soft, non-tender; bowel sounds normal; no masses,  no organomegaly Extremities: edema 1+ pretibial edema and presacral edema Neuro:  no asterixis, nonfocal exam  Labs: Basic Metabolic Panel: Recent Labs  Lab 12/04/18 2337  NA 138  K 4.0  CL 111  CO2 15*  GLUCOSE 67*  BUN 74*  CREATININE 6.65*  CALCIUM 7.6*   Liver Function Tests: No results for input(s): AST, ALT, ALKPHOS, BILITOT, PROT, ALBUMIN in the last 168 hours. No results for input(s): LIPASE, AMYLASE in the last 168 hours. No results for input(s): AMMONIA in the last 168 hours. CBC: Recent Labs  Lab 12/04/18 2337  WBC 7.3  HGB 8.0*  HCT 26.6*  MCV 94.7  PLT 144*   PT/INR: _2 (inr:5) Cardiac Enzymes: )No results for input(s): CKTOTAL, CKMB, CKMBINDEX, TROPONINI in the last 168 hours. CBG: Recent Labs  Lab 12/05/18 0245 12/05/18 0335 12/05/18 0726 12/05/18 1156  GLUCAP 54* 109* 156* 140*    Iron Studies: No  results for input(s): IRON, TIBC, TRANSFERRIN, FERRITIN in the last 168 hours.  Xrays/Other Studies: Dg Chest 2 View  Result Date: 12/05/2018 CLINICAL DATA:  63 y/o F; productive cough, shortness of breath, swelling of lower extremities. EXAM: CHEST - 2 VIEW COMPARISON:  10/11/2018 chest radiograph. FINDINGS: Stable cardiomegaly given projection and technique. Aortic atherosclerosis with calcification. Small pleural effusions. Increased reticular opacities of the lungs with basilar predominance. No pneumothorax. No acute osseous abnormality is evident. IMPRESSION: Stable cardiomegaly. Interstitial pulmonary edema and small pleural effusions. Electronically Signed   By: Kristine Garbe M.D.   On: 12/05/2018 00:28     Assessment/Plan: 1.  Acute on chronic systolic CHF in setting of progressive CKD.  She feels better after IV lasix and agree with increasing to  186m iv bid and follow UOP and Scr.  Would like to hold off on initiating dialysis as she is more suited for PD.  Restrict fluid and sodium intake and follow response to IV lasix.  troponins were negative. 2. 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  She is scheduled for consultation for PD catheter placement on 12/07/18.  She does not need emergent dialysis as long as she responds to IV lasix.  Will continue to follow closely as we prefer to start her on PD but will need cath placement in the very near future.  Cr has actually improved overnight and will cont to monitor it daily. 3. Anemia of CKD- receives aranesp 209m every 3 weeks and recently had some feraheme.  Continue to follow but may require blood transfusion if continues to drop. 4. 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   5. HTN- stable 6. DM type 2- per primary svc 7. Depression - on lexapro 8. Dyslipidemia- on lipitor.   JoGovernor Rooksoladonato 12/05/2018, 4:15 PM

## 2018-12-06 ENCOUNTER — Inpatient Hospital Stay (HOSPITAL_COMMUNITY): Admission: RE | Admit: 2018-12-06 | Payer: Medicaid Other | Source: Ambulatory Visit

## 2018-12-06 DIAGNOSIS — I509 Heart failure, unspecified: Secondary | ICD-10-CM

## 2018-12-06 DIAGNOSIS — Z79899 Other long term (current) drug therapy: Secondary | ICD-10-CM | POA: Diagnosis not present

## 2018-12-06 DIAGNOSIS — E785 Hyperlipidemia, unspecified: Secondary | ICD-10-CM | POA: Diagnosis present

## 2018-12-06 DIAGNOSIS — Z7952 Long term (current) use of systemic steroids: Secondary | ICD-10-CM | POA: Diagnosis not present

## 2018-12-06 DIAGNOSIS — F329 Major depressive disorder, single episode, unspecified: Secondary | ICD-10-CM | POA: Diagnosis present

## 2018-12-06 DIAGNOSIS — Z833 Family history of diabetes mellitus: Secondary | ICD-10-CM | POA: Diagnosis not present

## 2018-12-06 DIAGNOSIS — Z800 Family history of malignant neoplasm of digestive organs: Secondary | ICD-10-CM | POA: Diagnosis not present

## 2018-12-06 DIAGNOSIS — Z8249 Family history of ischemic heart disease and other diseases of the circulatory system: Secondary | ICD-10-CM | POA: Diagnosis not present

## 2018-12-06 DIAGNOSIS — J810 Acute pulmonary edema: Secondary | ICD-10-CM

## 2018-12-06 DIAGNOSIS — D649 Anemia, unspecified: Secondary | ICD-10-CM | POA: Diagnosis present

## 2018-12-06 DIAGNOSIS — Z7982 Long term (current) use of aspirin: Secondary | ICD-10-CM | POA: Diagnosis not present

## 2018-12-06 DIAGNOSIS — Z794 Long term (current) use of insulin: Secondary | ICD-10-CM | POA: Diagnosis not present

## 2018-12-06 DIAGNOSIS — Z7951 Long term (current) use of inhaled steroids: Secondary | ICD-10-CM | POA: Diagnosis not present

## 2018-12-06 DIAGNOSIS — I5042 Chronic combined systolic (congestive) and diastolic (congestive) heart failure: Secondary | ICD-10-CM

## 2018-12-06 DIAGNOSIS — Z841 Family history of disorders of kidney and ureter: Secondary | ICD-10-CM | POA: Diagnosis not present

## 2018-12-06 DIAGNOSIS — E1122 Type 2 diabetes mellitus with diabetic chronic kidney disease: Secondary | ICD-10-CM | POA: Diagnosis present

## 2018-12-06 DIAGNOSIS — D631 Anemia in chronic kidney disease: Secondary | ICD-10-CM | POA: Diagnosis not present

## 2018-12-06 DIAGNOSIS — E11649 Type 2 diabetes mellitus with hypoglycemia without coma: Secondary | ICD-10-CM | POA: Diagnosis present

## 2018-12-06 DIAGNOSIS — E669 Obesity, unspecified: Secondary | ICD-10-CM | POA: Diagnosis present

## 2018-12-06 DIAGNOSIS — Z6830 Body mass index (BMI) 30.0-30.9, adult: Secondary | ICD-10-CM | POA: Diagnosis not present

## 2018-12-06 DIAGNOSIS — R630 Anorexia: Secondary | ICD-10-CM | POA: Diagnosis present

## 2018-12-06 DIAGNOSIS — F419 Anxiety disorder, unspecified: Secondary | ICD-10-CM | POA: Diagnosis present

## 2018-12-06 DIAGNOSIS — I132 Hypertensive heart and chronic kidney disease with heart failure and with stage 5 chronic kidney disease, or end stage renal disease: Secondary | ICD-10-CM | POA: Diagnosis not present

## 2018-12-06 DIAGNOSIS — N186 End stage renal disease: Secondary | ICD-10-CM | POA: Diagnosis present

## 2018-12-06 DIAGNOSIS — I5043 Acute on chronic combined systolic (congestive) and diastolic (congestive) heart failure: Secondary | ICD-10-CM | POA: Diagnosis not present

## 2018-12-06 DIAGNOSIS — F418 Other specified anxiety disorders: Secondary | ICD-10-CM | POA: Diagnosis present

## 2018-12-06 DIAGNOSIS — N185 Chronic kidney disease, stage 5: Secondary | ICD-10-CM | POA: Diagnosis not present

## 2018-12-06 DIAGNOSIS — N2581 Secondary hyperparathyroidism of renal origin: Secondary | ICD-10-CM | POA: Diagnosis present

## 2018-12-06 DIAGNOSIS — E8770 Fluid overload, unspecified: Secondary | ICD-10-CM | POA: Diagnosis not present

## 2018-12-06 LAB — BASIC METABOLIC PANEL
ANION GAP: 13 (ref 5–15)
BUN: 77 mg/dL — ABNORMAL HIGH (ref 6–20)
CO2: 18 mmol/L — ABNORMAL LOW (ref 22–32)
Calcium: 7.9 mg/dL — ABNORMAL LOW (ref 8.9–10.3)
Chloride: 107 mmol/L (ref 98–111)
Creatinine, Ser: 7.03 mg/dL — ABNORMAL HIGH (ref 0.44–1.00)
GFR calc Af Amer: 7 mL/min — ABNORMAL LOW (ref >60)
GFR calc non Af Amer: 6 mL/min — ABNORMAL LOW (ref >60)
Glucose, Bld: 93 mg/dL (ref 70–99)
Potassium: 4.2 mmol/L (ref 3.5–5.1)
Sodium: 138 mmol/L (ref 135–145)

## 2018-12-06 LAB — CBC
HCT: 25.2 % — ABNORMAL LOW (ref 36.0–46.0)
Hemoglobin: 7.3 g/dL — ABNORMAL LOW (ref 12.0–15.0)
MCH: 27.3 pg (ref 26.0–34.0)
MCHC: 29 g/dL — ABNORMAL LOW (ref 30.0–36.0)
MCV: 94.4 fL (ref 80.0–100.0)
NRBC: 0 % (ref 0.0–0.2)
Platelets: 146 10*3/uL — ABNORMAL LOW (ref 150–400)
RBC: 2.67 MIL/uL — AB (ref 3.87–5.11)
RDW: 16.9 % — ABNORMAL HIGH (ref 11.5–15.5)
WBC: 6.3 10*3/uL (ref 4.0–10.5)

## 2018-12-06 LAB — GLUCOSE, CAPILLARY
Glucose-Capillary: 103 mg/dL — ABNORMAL HIGH (ref 70–99)
Glucose-Capillary: 177 mg/dL — ABNORMAL HIGH (ref 70–99)
Glucose-Capillary: 90 mg/dL (ref 70–99)
Glucose-Capillary: 132 mg/dL — ABNORMAL HIGH (ref 70–99)

## 2018-12-06 MED ORDER — INSULIN ASPART 100 UNIT/ML ~~LOC~~ SOLN
0.0000 [IU] | Freq: Three times a day (TID) | SUBCUTANEOUS | Status: DC
  Administered 2018-12-06: 2 [IU] via SUBCUTANEOUS

## 2018-12-06 MED ORDER — DARBEPOETIN ALFA 200 MCG/0.4ML IJ SOSY
200.0000 ug | PREFILLED_SYRINGE | INTRAMUSCULAR | Status: DC
  Administered 2018-12-06: 200 ug via SUBCUTANEOUS
  Filled 2018-12-06: qty 0.4

## 2018-12-06 MED ORDER — FUROSEMIDE 80 MG PO TABS
80.0000 mg | ORAL_TABLET | Freq: Two times a day (BID) | ORAL | Status: DC
  Administered 2018-12-06 – 2018-12-07 (×2): 80 mg via ORAL
  Filled 2018-12-06 (×2): qty 1

## 2018-12-06 MED ORDER — FUROSEMIDE 10 MG/ML IJ SOLN
120.0000 mg | Freq: Two times a day (BID) | INTRAVENOUS | Status: DC
  Administered 2018-12-06: 120 mg via INTRAVENOUS
  Filled 2018-12-06: qty 12
  Filled 2018-12-06: qty 10

## 2018-12-06 MED FILL — GABAPENTIN 100 MG CAP: 100 | 30 days supply | Qty: 30 | Fill #0

## 2018-12-06 NOTE — Progress Notes (Signed)
Patient ID: Patricia [name], female   DOB: 1956/06/04, 63 y.o.   MRN: [id number] S:she feels much better with improvement of her breathing and edema. O:BP 123/64 (BP Location: Right Arm)   Pulse 86   Temp 97.9 F (36.6 C) (Oral)   Resp 16   Ht 5\' 4"  (1.626 m)   Wt 81.4 kg   SpO2 100%   BMI 30.81 kg/m   Intake/Output Summary (Last 24 hours) at 12/06/2018 1247 Last data filed at 12/06/2018 0800 Gross per 24 hour  Intake 531.3 ml  Output 1101 ml  Net -569.7 ml   Intake/Output: I/O last 3 completed shifts: In: 491.3 [P.O.:440; I.V.:1.3; IV Piggyback:50] Out: 1601 [Urine:1600; Stool:1]  Intake/Output this shift:  Total I/O In: 240 [P.O.:240] Out: -  Weight change: -0.227 kg Gen:NAD CVS: no rub Resp: no wheezes, mild crackles at bases Abd: +BS, soft, NT Ext: trace edema  Recent Labs  Lab 12/04/18 2337 12/06/18 0454  NA 138 138  K 4.0 4.2  CL 111 107  CO2 15* 18*  GLUCOSE 67* 93  BUN 74* 77*  CREATININE 6.65* 7.03*  CALCIUM 7.6* 7.9*   Liver Function Tests: No results for input(s): AST, ALT, ALKPHOS, BILITOT, PROT, ALBUMIN in the last 168 hours. No results for input(s): LIPASE, AMYLASE in the last 168 hours. No results for input(s): AMMONIA in the last 168 hours. CBC: Recent Labs  Lab 12/04/18 2337 12/06/18 0454  WBC 7.3 6.3  HGB 8.0* 7.3*  HCT 26.6* 25.2*  MCV 94.7 94.4  PLT 144* 146*   Cardiac Enzymes: No results for input(s): CKTOTAL, CKMB, CKMBINDEX, TROPONINI in the last 168 hours. CBG: Recent Labs  Lab 12/05/18 1156 12/05/18 1654 12/05/18 2148 12/06/18 0603 12/06/18 1119  GLUCAP 140* 114* 169* 103* 177*    Iron Studies: No results for input(s): IRON, TIBC, TRANSFERRIN, FERRITIN in the last 72 hours. Studies/Results: Dg Chest 2 View  Result Date: 12/05/2018 CLINICAL DATA:  63 y/o F; productive cough, shortness of breath, swelling of lower extremities. EXAM: CHEST - 2 VIEW COMPARISON:  10/11/2018 chest radiograph. FINDINGS: Stable cardiomegaly  given projection and technique. Aortic atherosclerosis with calcification. Small pleural effusions. Increased reticular opacities of the lungs with basilar predominance. No pneumothorax. No acute osseous abnormality is evident. IMPRESSION: Stable cardiomegaly. Interstitial pulmonary edema and small pleural effusions. Electronically Signed   By: Kristine Garbe M.D.   On: 12/05/2018 00:28   . aspirin EC  81 mg Oral Daily  . atorvastatin  10 mg Oral Daily  . calcitRIOL  0.25 mcg Oral Daily  . carvedilol  25 mg Oral BID WC  . dextrose  50 mL Intravenous Once  . escitalopram  20 mg Oral QHS  . fluticasone  1 spray Each Nare Daily  . gabapentin  100 mg Oral QHS  . heparin  5,000 Units Subcutaneous Q8H  . hydrALAZINE  50 mg Oral BID  . insulin aspart  0-5 Units Subcutaneous QHS  . insulin aspart  0-9 Units Subcutaneous TID WC  . isosorbide mononitrate  15 mg Oral Daily  . nitroGLYCERIN  1 inch Topical Q6H  . sevelamer carbonate  1,600 mg Oral TID WC  . sodium chloride flush  3 mL Intravenous Once  . sodium chloride flush  3 mL Intravenous Q12H    BMET    Component Value Date/Time   NA 138 12/06/2018 0454   NA 143 05/23/2018 1607   K 4.2 12/06/2018 0454   CL 107 12/06/2018 0454   CO2  18 (L) 12/06/2018 0454   GLUCOSE 93 12/06/2018 0454   BUN 77 (H) 12/06/2018 0454   BUN 73 (H) 05/23/2018 1607   CREATININE 7.03 (H) 12/06/2018 0454   CREATININE 2.22 (H) 12/25/2016 1000   CALCIUM 7.9 (L) 12/06/2018 0454   CALCIUM 8.0 (L) 08/30/2018 1300   GFRNONAA 6 (L) 12/06/2018 0454   GFRNONAA 24 (L) 12/25/2016 1000   GFRAA 7 (L) 12/06/2018 0454   GFRAA 27 (L) 12/25/2016 1000   CBC    Component Value Date/Time   WBC 6.3 12/06/2018 0454   RBC 2.67 (L) 12/06/2018 0454   HGB 7.3 (L) 12/06/2018 0454   HGB 9.7 (L) 05/23/2018 1607   HCT 25.2 (L) 12/06/2018 0454   HCT 31.9 (L) 05/23/2018 1607   PLT 146 (L) 12/06/2018 0454   PLT 235 05/23/2018 1607   MCV 94.4 12/06/2018 0454   MCV 92  05/23/2018 1607   MCH 27.3 12/06/2018 0454   MCHC 29.0 (L) 12/06/2018 0454   RDW 16.9 (H) 12/06/2018 0454   RDW 18.5 (H) 05/23/2018 1607   LYMPHSABS 2.4 01/04/2017 1220   MONOABS 0.3 11/29/2016 0325   EOSABS 0.1 01/04/2017 1220   BASOSABS 0.0 01/04/2017 1220    Assessment/Plan: 1.  Acute on chronic systolic CHF in setting of progressive CKD.  She feels better after IV lasix.   1. Scr still at baseline and she is without uremic symptoms.  Would change to po lasix 80mg  bid (was only taking 40mg  daily) and f/u with Dr. Joelyn Oms next week and try to get her PD catheter placed as soon as possible.  Would like to hold off on initiating dialysis as she is more suited for PD.   2. Restrict fluid and sodium intake and follow response to po lasix.  troponins were negative. 2. 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  She is scheduled for consultation for PD catheter placement on 12/07/18.  She does not need emergent dialysis.  She has responded well to IV lasix.  Will switch to po lasix and follow uop and Cr. Would prefer to start her on PD as an outpatient and will try to avoid the need for urgent HD in hospital.   1. Cr stable. 2. Change to po lasix and if does ok overnight will try to discharge so she can be seen by Dr. Raul Del for PD catheter placement asap.  3. Anemia of CKD- receives aranesp 210mcg every 3 weeks and recently had some feraheme.  Continue to follow but may require blood transfusion if continues to drop.  Will dose today. 4. 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   5. HTN- stable 6. DM type 2- per primary svc 7. Depression - on lexapro 8. Dyslipidemia- on lipitor. Donetta Potts, MD [employer] [phone]

## 2018-12-06 NOTE — Progress Notes (Signed)
PROGRESS NOTE    Patricia [name]  TXH:[id number] DOB: 1956-09-08 DOA: 12/04/2018 PCP: Ladell Pier, MD  Outpatient Specialists:   Brief Narrative:  Patricia [name] a 63 year old African-American female, with past medical history significant for CKD-5, hypertension, hyperlipidemia, diabetes mellitus, anemia, CHF with EF of 45%, obesity, depression and anxiety.  Patient presented with shortness of breath, dyspnea on exertion, orthopnea and worsening edema.  Patient has been admitted for management of pulmonary edema/CHF/volume overload.  Nephrology input is highly appreciated.  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  Patient is responding to IV Lasix.  Nephrology team plans to change IV Lasix to p.o. Lasix today, 12/06/2018.  The plan is to possibly discharge patient tomorrow if patient continues to do well on oral Lasix.  Has appointment for peritoneal dialysis catheter placement tomorrow.  Assessment & Plan:   Principal Problem:   Fluid overload Active Problems:   Dyslipidemia   Essential hypertension   Anxiety and depression   Acute on chronic combined systolic and diastolic CHF (congestive heart failure) (HCC)   CKD (chronic kidney disease) stage 5, GFR less than 15 ml/min (HCC)   Anemia due to stage 5 chronic kidney disease, not on chronic dialysis (HCC)   Type II diabetes mellitus with renal manifestations (HCC)   Hypoglycemia   CHF (congestive heart failure) (HCC)  Pulmonary edema/acute on chronic chronic combined systolic and diastolic CHF/Volume overload:  - Patient on IV Lasix 120 mg Bid. -Lasix to be transitioned to oral as per Nephrology -2D echo done on 2/244/2020 is as documented below. -Daily weights -strict I/O's -Low salt diet -Fluid restriction -Nephrology input is appreciated. -Possible discharge home in am as patient has appointment for PD Catheter placement.  CKD (chronic kidney disease) stage 5, GFR less than 15  ml/min ([place]):  - Likely approached ESRD. -Peritoneal dialysis is planned. -Nephrology is directing care. -Anemia of CKD and CKD-MBD as per Nephrology. -On Calcitriol and Sevelamer. -Hb is 7.3g/dl, Calcium is 7.9 -BUN of 77 and Scr was 7.03. -??Appointment for PD catheter placement planned for tomorrow.  Dyslipidemia: -lipitor  HTN:  -Optimized. -Continue home medications: Hydralazine, Coreg, ISMN. -IV hydralazine prn  Anxiety and depression: -on Lexapro  Type II diabetes mellitus with renal manifestations ([place]):  Last A1c 6.3 on 07/15/18. SSI coverage.  Hypoglycemia: -Blood sugar of 54 documented on 12/05/2018 -No further hypoglycemia noted.  DVT ppx: SQ Heparin    Code Status: Full code Family Communication:  2 Daughters Disposition Plan:  Anticipate discharge back to previous home environment  Consultants:   Nephrology  Procedures:  Echo:  1. The left ventricle has mild-moderately reduced systolic function, with an ejection fraction of 40-45%. The cavity size was moderately dilated. There is mildly increased left ventricular wall thickness. Left ventricular diastolic Doppler parameters are consistent with impaired relaxation.  2. The right ventricle has normal systolic function. The cavity was normal. There is no increase in right ventricular wall thickness.  3. Left atrial size was severely dilated.  4. Right atrial size was mildly dilated.  5. Large pleural effusion.  6. Trivial pericardial effusion is present.  7. The mitral valve is normal in structure. Mild thickening of the mitral valve leaflet. Mitral valve regurgitation is severe by color flow Doppler.  8. The tricuspid valve is normal in structure.  9. The aortic valve is normal in structure. 10. The pulmonic valve was normal in structure. 11. The interatrial septum was not assessed.  Antimicrobials:  None    Subjective: SOB is improving Edema is improving  Objective: Vitals:   12/06/18  0554 12/06/18 0800 12/06/18 0947 12/06/18 1146  BP: 136/73 137/65 130/64 123/64  Pulse: 87 86 86   Resp: 18 16  16   Temp: 98.3 F (36.8 C) 97.6 F (36.4 C)  97.9 F (36.6 C)  TempSrc: Oral Oral  Oral  SpO2: 100% 100%  100%  Weight: 81.4 kg     Height:        Intake/Output Summary (Last 24 hours) at 12/06/2018 1409 Last data filed at 12/06/2018 0800 Gross per 24 hour  Intake 531.3 ml  Output 1101 ml  Net -569.7 ml   Filed Weights   12/05/18 0223 12/05/18 0430 12/06/18 0554  Weight: 81.6 kg 81.5 kg 81.4 kg    Examination:  General exam: Appears calm and comfortable.  Respiratory system: Decreased air entry posteriorly.  Bi-basal crackles posteriorly. Cardiovascular system: S1 & S2. Gastrointestinal system: Abdomen is obese, soft and nontender. Organs are difficult to assess. Central nervous system: Alert and oriented.  Patient moves all limbs. Extremities: Edema lower legs.  Data Reviewed: I have personally reviewed following labs and imaging studies  CBC: Recent Labs  Lab 12/04/18 2337 12/06/18 0454  WBC 7.3 6.3  HGB 8.0* 7.3*  HCT 26.6* 25.2*  MCV 94.7 94.4  PLT 144* 193*   Basic Metabolic Panel: Recent Labs  Lab 12/04/18 2337 12/06/18 0454  NA 138 138  K 4.0 4.2  CL 111 107  CO2 15* 18*  GLUCOSE 67* 93  BUN 74* 77*  CREATININE 6.65* 7.03*  CALCIUM 7.6* 7.9*   GFR: Estimated Creatinine Clearance: 8.8 mL/min (A) (by C-G formula based on SCr of 7.03 mg/dL (H)). Liver Function Tests: No results for input(s): AST, ALT, ALKPHOS, BILITOT, PROT, ALBUMIN in the last 168 hours. No results for input(s): LIPASE, AMYLASE in the last 168 hours. No results for input(s): AMMONIA in the last 168 hours. Coagulation Profile: No results for input(s): INR, PROTIME in the last 168 hours. Cardiac Enzymes: No results for input(s): CKTOTAL, CKMB, CKMBINDEX, TROPONINI in the last 168 hours. BNP (last 3 results) No results for input(s): PROBNP in the last 8760  hours. HbA1C: No results for input(s): HGBA1C in the last 72 hours. CBG: Recent Labs  Lab 12/05/18 1156 12/05/18 1654 12/05/18 2148 12/06/18 0603 12/06/18 1119  GLUCAP 140* 114* 169* 103* 177*   Lipid Profile: No results for input(s): CHOL, HDL, LDLCALC, TRIG, CHOLHDL, LDLDIRECT in the last 72 hours. Thyroid Function Tests: No results for input(s): TSH, T4TOTAL, FREET4, T3FREE, THYROIDAB in the last 72 hours. Anemia Panel: No results for input(s): VITAMINB12, FOLATE, FERRITIN, TIBC, IRON, RETICCTPCT in the last 72 hours. Urine analysis:    Component Value Date/Time   COLORURINE YELLOW 06/02/2018 1713   APPEARANCEUR HAZY (A) 06/02/2018 1713   LABSPEC 1.012 06/02/2018 1713   PHURINE 5.0 06/02/2018 1713   GLUCOSEU 50 (A) 06/02/2018 1713   HGBUR NEGATIVE 06/02/2018 1713   BILIRUBINUR NEGATIVE 06/02/2018 1713   BILIRUBINUR 1.0 10/09/2016 1306   KETONESUR NEGATIVE 06/02/2018 1713   PROTEINUR >=300 (A) 06/02/2018 1713   UROBILINOGEN 0.2 04/24/2018 1529   NITRITE NEGATIVE 06/02/2018 1713   LEUKOCYTESUR NEGATIVE 06/02/2018 1713   Sepsis Labs: @LABRCNTIP (procalcitonin:4,lacticidven:4)  )No results found for this or any previous visit (from the past 240 hour(s)).       Radiology Studies: Dg Chest 2 View  Result Date: 12/05/2018 CLINICAL DATA:  63 y/o F; productive cough, shortness of  breath, swelling of lower extremities. EXAM: CHEST - 2 VIEW COMPARISON:  10/11/2018 chest radiograph. FINDINGS: Stable cardiomegaly given projection and technique. Aortic atherosclerosis with calcification. Small pleural effusions. Increased reticular opacities of the lungs with basilar predominance. No pneumothorax. No acute osseous abnormality is evident. IMPRESSION: Stable cardiomegaly. Interstitial pulmonary edema and small pleural effusions. Electronically Signed   By: Kristine Garbe M.D.   On: 12/05/2018 00:28        Scheduled Meds: . aspirin EC  81 mg Oral Daily  .  atorvastatin  10 mg Oral Daily  . calcitRIOL  0.25 mcg Oral Daily  . carvedilol  25 mg Oral BID WC  . darbepoetin (ARANESP) injection - NON-DIALYSIS  200 mcg Subcutaneous Q21 days  . dextrose  50 mL Intravenous Once  . escitalopram  20 mg Oral QHS  . fluticasone  1 spray Each Nare Daily  . furosemide  80 mg Oral BID  . gabapentin  100 mg Oral QHS  . heparin  5,000 Units Subcutaneous Q8H  . hydrALAZINE  50 mg Oral BID  . insulin aspart  0-5 Units Subcutaneous QHS  . insulin aspart  0-9 Units Subcutaneous TID WC  . isosorbide mononitrate  15 mg Oral Daily  . nitroGLYCERIN  1 inch Topical Q6H  . sevelamer carbonate  1,600 mg Oral TID WC  . sodium chloride flush  3 mL Intravenous Once  . sodium chloride flush  3 mL Intravenous Q12H   Continuous Infusions: . sodium chloride Stopped (12/05/18 2248)     LOS: 0 days    Time spent: 35 Minutes.    Dana Allan, MD  [facility] Pager #: [phone] 7PM-7AM contact night coverage as above

## 2018-12-07 LAB — GLUCOSE, CAPILLARY: GLUCOSE-CAPILLARY: 93 mg/dL (ref 70–99)

## 2018-12-07 LAB — BASIC METABOLIC PANEL
Anion gap: 13 (ref 5–15)
BUN: 79 mg/dL — ABNORMAL HIGH (ref 6–20)
CO2: 18 mmol/L — ABNORMAL LOW (ref 22–32)
Calcium: 8.2 mg/dL — ABNORMAL LOW (ref 8.9–10.3)
Chloride: 107 mmol/L (ref 98–111)
Creatinine, Ser: 7.06 mg/dL — ABNORMAL HIGH (ref 0.44–1.00)
GFR calc Af Amer: 7 mL/min — ABNORMAL LOW (ref >60)
GFR calc non Af Amer: 6 mL/min — ABNORMAL LOW (ref >60)
Glucose, Bld: 93 mg/dL (ref 70–99)
Potassium: 4.3 mmol/L (ref 3.5–5.1)
Sodium: 138 mmol/L (ref 135–145)

## 2018-12-07 MED ORDER — ACETAMINOPHEN 325 MG PO TABS: 650.0000 mg | ORAL_TABLET | ORAL | Status: DC | PRN

## 2018-12-07 MED ORDER — FUROSEMIDE 80 MG PO TABS: 80.0000 mg | ORAL_TABLET | Freq: Two times a day (BID) | ORAL | 0 refills | Status: DC

## 2018-12-07 MED ORDER — INSULIN GLARGINE 100 UNIT/ML SOLOSTAR PEN: 5.0000 [IU] | PEN_INJECTOR | Freq: Every day | SUBCUTANEOUS | 2 refills | Status: DC

## 2018-12-07 MED ORDER — INSULIN GLARGINE 100 UNIT/ML SOLOSTAR PEN: 4.0000 [IU] | PEN_INJECTOR | Freq: Every day | SUBCUTANEOUS | 2 refills | Status: DC

## 2018-12-07 NOTE — Discharge Summary (Signed)
Patricia [name], is a 63 y.o. female  DOB 08/03/1956  MRN [id number].  Admission date:  12/04/2018  Admitting Physician  Ivor Costa, MD  Discharge Date:  12/07/2018   Primary MD  Ladell Pier, MD  Recommendations for primary care physician for things to follow:  -Please check CBC, BMP during next visit, patient to follow with nephrology next week.   Admission Diagnosis  dif fbreathing when lying down   Discharge Diagnosis  dif fbreathing when lying down    Principal Problem:   Fluid overload Active Problems:   Dyslipidemia   Essential hypertension   Anxiety and depression   Acute on chronic combined systolic and diastolic CHF (congestive heart failure) (HCC)   CKD (chronic kidney disease) stage 5, GFR less than 15 ml/min (HCC)   Anemia due to stage 5 chronic kidney disease, not on chronic dialysis ([place])   Type II diabetes mellitus with renal manifestations (HCC)   Hypoglycemia   CHF (congestive heart failure) ([place])      Past Medical History:  Diagnosis Date  . Allergy   . Anemia   . Anxiety   . Blood transfusion without reported diagnosis   . Cardiomyopathy ([place]) 11/2016   no ischemic eval due to CKD  . CHF (congestive heart failure) ([place])   . CKD (chronic kidney disease), stage IV ([place])   . Depression   . Diabetes mellitus without complication ([place])    type 2  . Dyspnea   . Hypertension   . Obesity     Past Surgical History:  Procedure Laterality Date  . ABDOMINAL HYSTERECTOMY    . BREAST SURGERY     reduction  . [place]  . LIPOMA EXCISION     back  Dr. Excell Seltzer 03-22-18  . LIPOMA EXCISION N/A 03/22/2018   Procedure: EXCISION OF BACK LIPOMA;  Surgeon: Excell Seltzer, MD;  Location: [facility]S;  Service: General;  Laterality: N/A;  . REDUCTION MAMMAPLASTY Bilateral        History of present illness and  Hospital Course:     Kindly see H&P for  history of present illness and admission details, please review complete Labs, Consult reports and Test reports for all details in brief  HPI  from the history and physical done on the day of admission 12/05/2018  HPI: Patricia [name] is a 63 y.o. female with medical history significant of CKD-5, hypertension, hyperlipidemia, diabetes mellitus, anemia, CHF with EF of 45%, obesity, depression with anxiety, who presents with shortness of breath.  Patient states that she has been having shortness of breath for almost 3 weeks, which has been progressively worsening.  She has cough with clear mucus production.  She also has bilateral leg edema.  She does not have chest pain, but reports that she has chest pressure when laying down.  She has orthopnea.  No fever, but has chills.  Patient denies nausea, vomiting, diarrhea, abdominal pain, symptoms of UTI.  Patient states she has decreased urine output recently.  Patient states that she is taking Lasix 80 mg daily, today she took extra dose of Lasix without help. Pt states her renal doctor, Dr. Joelyn Oms is planing to start peritoneal dialysis soon.  ED Course: pt was found to have negative troponin, WBC 7.3, renal function close to baseline, potassium 4.0, blood sugar 67, temperature normal, no tachycardia, no tachypnea, oxygen saturation 99% on room air, chest x-ray showed interstitial pulmonary edema and cardiomegaly with a small pleural effusion.  Patient is placed on telemetry bed for observation.  Hospital Course   Patricia [name] a 63 year old African-American female, with past medical history significant for CKD-5, hypertension, hyperlipidemia, diabetes mellitus, anemia, CHF with EF of 45%, obesity, depression and anxiety.  Patient presented with shortness of breath, dyspnea on exertion, orthopnea and worsening edema.  Patient has been admitted for management of pulmonary edema/CHF/volume overload.  Nephrology input is highly appreciated.  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  Patient is responding to IV Lasix.  Transition to oral Lasix, appears to be a euvolemic today, she will be discharged on increased dose of Lasix, with outpatient follow-up with renal, patient likely will start peritoneal dialysis as an outpatient.  Pulmonary edema/acute on chronic chronic combined systolic and diastolic CHF/Volume overload: -Patient presents with significant volume overload in the setting of advanced CKD stage V, she was started on IV Lasix with good improvement of respiratory status, she is on room air today, with no evidence of volume overload, she will be discharged on increased dose of Lasix 80 mg oral twice daily, with fluid restriction and low-salt diet. -Patient supposed to have an outpatient consultation today for peritoneal dialysis, have discussed with renal, they will D/W general surgery as an outpatient to reschedule the appointment, possibly for Friday.  CKD (chronic kidney disease) stage 5, GFR less than 15 ml/min ([place]): - Likely approached ESRD. -Peritoneal dialysis is planned. -Anemia of CKD and CKD-MBD as per Nephrology. -On Calcitriol and Sevelamer.  Dyslipidemia: -lipitor  HTN:  -Optimized. -Continue home medications:Hydralazine, Coreg, ISMN. -IV hydralazine prn  Anxiety and depression: -onLexapro  Type II diabetes mellitus with renal manifestations ([place]): Last A1c6.3 on 07/15/18. Patient was kept on insulin sliding scale during hospital stay, all her blood sugar has been controlled, with no need for insulin, so her home regimen has been adjusted, I have stopped her oral agent, and decrease her Lantus to 4 units subcu daily.  Hypoglycemia: -Blood sugar of 54 documented on 12/05/2018 -No further hypoglycemia noted.  Anemia of chronic kidney disease -Most recent hemoglobin was 7.3, she denies any dyspnea or chest pain today, receives Aranesp as an outpatient  Discharge  Condition:   stable Follow UP  Follow-up Information    Ladell Pier, MD.   Specialty:  Internal Medicine Contact information: [place]  [phone]             Discharge Instructions  and  Discharge Medications    Discharge Instructions    Discharge instructions   Complete by:  As directed    Follow with Primary MD Ladell Pier, MD in 7 days   Get CBC, CMP,  checked  by Primary MD next visit.    Activity: As tolerated with Full fall precautions use walker/cane & assistance as needed   Disposition Home    Diet: Heart Healthy , carb modified , with feeding assistance and aspiration precautions.  For Heart failure patients - Check your Weight same  time everyday, if you gain over 2 pounds, or you develop in leg swelling, experience more shortness of breath or chest pain, call your Primary MD immediately. Follow Cardiac Low Salt Diet and 1.5 lit/day fluid restriction.   On your next visit with your primary care physician please Get Medicines reviewed and adjusted.   Please request your Prim.MD to go over all Hospital Tests and Procedure/Radiological results at the follow up, please get all Hospital records sent to your Prim MD by signing hospital release before you go home.   If you experience worsening of your admission symptoms, develop shortness of breath, life threatening emergency, suicidal or homicidal thoughts you must seek medical attention immediately by calling 911 or calling your MD immediately  if symptoms less severe.  You Must read complete instructions/literature along with all the possible adverse reactions/side effects for all the Medicines you take and that have been prescribed to you. Take any new Medicines after you have completely understood and accpet all the possible adverse reactions/side effects.   Do not drive, operating heavy machinery, perform activities at heights, swimming or participation in water  activities or provide baby sitting services if your were admitted for syncope or siezures until you have seen by Primary MD or a Neurologist and advised to do so again.  Do not drive when taking Pain medications.    Do not take more than prescribed Pain, Sleep and Anxiety Medications  Special Instructions: If you have smoked or chewed Tobacco  in the last 2 yrs please stop smoking, stop any regular Alcohol  and or any Recreational drug use.  Wear Seat belts while driving.   Please note  You were cared for by a hospitalist during your hospital stay. If you have any questions about your discharge medications or the care you received while you were in the hospital after you are discharged, you can call the unit and asked to speak with the hospitalist on call if the hospitalist that took care of you is not available. Once you are discharged, your primary care physician will handle any further medical issues. Please note that NO REFILLS for any discharge medications will be authorized once you are discharged, as it is imperative that you return to your primary care physician (or establish a relationship with a primary care physician if you do not have one) for your aftercare needs so that they can reassess your need for medications and monitor your lab values.   Increase activity slowly   Complete by:  As directed      Allergies as of 12/07/2018      Reactions   Codone [hydrocodone] Itching   Norvasc [amlodipine Besylate] Swelling   Lower extremity?   Sulfa Antibiotics Itching, Rash      Medication List    STOP taking these medications   bismuth subsalicylate 726 OM/35DH suspension Commonly known as:  PEPTO BISMOL   glipiZIDE 10 MG tablet Commonly known as:  GLUCOTROL   predniSONE 10 MG (21) Tbpk tablet Commonly known as:  STERAPRED UNI-PAK 21 TAB     TAKE these medications   ACCU-CHEK FASTCLIX LANCETS Misc Uad tid   ACCU-CHEK GUIDE w/Device Kit 1 each by Does not apply route 3  (three) times daily. Use tid as directed   acetaminophen 325 MG tablet Commonly known as:  TYLENOL Take 2 tablets (650 mg total) by mouth every 4 (four) hours as needed for headache or mild pain. What changed:    medication strength  how much  to take  when to take this  reasons to take this   albuterol 108 (90 Base) MCG/ACT inhaler Commonly known as:  PROVENTIL HFA;VENTOLIN HFA Inhale 1-2 puffs into the lungs every 6 (six) hours as needed for wheezing or shortness of breath.   aspirin EC 81 MG tablet Take 1 tablet (81 mg total) by mouth daily.   atorvastatin 20 MG tablet Commonly known as:  LIPITOR Take 0.5 tablets (10 mg total) by mouth daily.   Bee Pollen 550 MG Caps Take 550 mg by mouth daily as needed (to boost the immune system).   calcitRIOL 0.25 MCG capsule Commonly known as:  ROCALTROL Take 0.25 mcg by mouth daily.   calcium carbonate 500 MG chewable tablet Commonly known as:  TUMS - dosed in mg elemental calcium Chew 1 tablet by mouth 3 (three) times daily as needed for indigestion or heartburn.   carvedilol 25 MG tablet Commonly known as:  COREG TAKE 1 TABLET BY MOUTH TWICE DAILY WITH A MEAL What changed:  See the new instructions.   escitalopram 20 MG tablet Commonly known as:  LEXAPRO TAKE 1 TABLET BY MOUTH AT BEDTIME.   fluticasone 50 MCG/ACT nasal spray Commonly known as:  FLONASE Place 1 spray into both nostrils daily.   furosemide 80 MG tablet Commonly known as:  LASIX Take 1 tablet (80 mg total) by mouth 2 (two) times daily. What changed:    medication strength  how much to take  when to take this   gabapentin 100 MG capsule Commonly known as:  NEURONTIN TAKE 1 CAPSULE (100 MG TOTAL) BY MOUTH AT BEDTIME.   Garlic 494 MG Tabs Take 100 mg by mouth daily.   glucose 4 GM chewable tablet Chew 1 tablet by mouth as needed for low blood sugar.   glucose blood test strip Commonly known as:  ACCU-CHEK GUIDE Use as instructed tid     hydrALAZINE 50 MG tablet Commonly known as:  APRESOLINE Take 1 tablet (50 mg total) by mouth 2 (two) times daily.   insulin aspart 100 UNIT/ML injection Commonly known as:  NOVOLOG 3 units before meals. What changed:    how much to take  how to take this  when to take this  reasons to take this  additional instructions   Insulin Glargine 100 UNIT/ML Solostar Pen Commonly known as:  LANTUS SOLOSTAR Inject 4 Units into the skin at bedtime. What changed:  how much to take   Insulin Pen Needle 32G X 4 MM Misc Commonly known as:  TRUEPLUS PEN NEEDLES Use as directed to inject insulin.   Insulin Pen Needle 31G X 5 MM Misc Use as directed   isosorbide mononitrate 30 MG 24 hr tablet Commonly known as:  IMDUR TAKE 1/2 TABLET BY MOUTH DAILY.   RENAGEL 800 MG tablet Generic drug:  sevelamer Take 1,600 mg by mouth 3 (three) times daily with meals.         Diet and Activity recommendation: See Discharge Instructions above   Consults obtained - renal  Major procedures and Radiology Reports - PLEASE review detailed and final reports for all details, in brief -      Dg Chest 2 View  Result Date: 12/05/2018 CLINICAL DATA:  63 y/o F; productive cough, shortness of breath, swelling of lower extremities. EXAM: CHEST - 2 VIEW COMPARISON:  10/11/2018 chest radiograph. FINDINGS: Stable cardiomegaly given projection and technique. Aortic atherosclerosis with calcification. Small pleural effusions. Increased reticular opacities of the lungs with basilar predominance.  No pneumothorax. No acute osseous abnormality is evident. IMPRESSION: Stable cardiomegaly. Interstitial pulmonary edema and small pleural effusions. Electronically Signed   By: Kristine Garbe M.D.   On: 12/05/2018 00:28    Micro Results     No results found for this or any previous visit (from the past 240 hour(s)).     Today   Subjective:   Kamilia Carollo today has no headache,no chest or abdominal  pain,no new weakness tingling or numbness, feels much better wants to go home today.  Objective:   Blood pressure (!) 146/67, pulse 90, temperature 98.2 F (36.8 C), temperature source Oral, resp. rate 16, height 5' 4"  (1.626 m), weight 81.6 kg, SpO2 96 %.   Intake/Output Summary (Last 24 hours) at 12/07/2018 1059 Last data filed at 12/07/2018 0841 Gross per 24 hour  Intake 720 ml  Output 500 ml  Net 220 ml    Exam Awake Alert, Oriented x 3, No new F.N deficits, Normal affect Symmetrical Chest wall movement, Good air movement bilaterally, CTAB RRR,No Gallops,Rubs or new Murmurs, No Parasternal Heave +ve B.Sounds, Abd Soft, Non tender, No organomegaly appriciated, No rebound -guarding or rigidity. No Cyanosis, Clubbing or edema, No new Rash or bruise  Data Review   CBC w Diff:  Lab Results  Component Value Date   WBC 6.3 12/06/2018   HGB 7.3 (L) 12/06/2018   HGB 9.7 (L) 05/23/2018   HCT 25.2 (L) 12/06/2018   HCT 31.9 (L) 05/23/2018   PLT 146 (L) 12/06/2018   PLT 235 05/23/2018   LYMPHOPCT 30 11/29/2016   MONOPCT 3 11/29/2016   EOSPCT 2 11/29/2016   BASOPCT 0 11/29/2016    CMP:  Lab Results  Component Value Date   NA 138 12/07/2018   NA 143 05/23/2018   K 4.3 12/07/2018   CL 107 12/07/2018   CO2 18 (L) 12/07/2018   BUN 79 (H) 12/07/2018   BUN 73 (H) 05/23/2018   CREATININE 7.06 (H) 12/07/2018   CREATININE 2.22 (H) 12/25/2016   PROT 5.4 (L) 11/29/2016   ALBUMIN 2.6 (L) 10/25/2018   BILITOT 0.5 11/29/2016   ALKPHOS 112 11/29/2016   AST 62 (H) 11/29/2016   ALT 62 (H) 11/29/2016  .   Total Time in preparing paper work, data evaluation and todays exam - 37 minutes  Phillips Climes M.D on 12/07/2018 at [place]  [phone]

## 2018-12-07 NOTE — Discharge Instructions (Signed)
Follow with Primary MD Ladell Pier, MD in 7 days   Get CBC, CMP,  checked  by Primary MD next visit.    Activity: As tolerated with Full fall precautions use walker/cane & assistance as needed   Disposition Home    Diet: Heart Healthy  , with feeding assistance and aspiration precautions.  For Heart failure patients - Check your Weight same time everyday, if you gain over 2 pounds, or you develop in leg swelling, experience more shortness of breath or chest pain, call your Primary MD immediately. Follow Cardiac Low Salt Diet and 1.5 lit/day fluid restriction.   On your next visit with your primary care physician please Get Medicines reviewed and adjusted.   Please request your Prim.MD to go over all Hospital Tests and Procedure/Radiological results at the follow up, please get all Hospital records sent to your Prim MD by signing hospital release before you go home.   If you experience worsening of your admission symptoms, develop shortness of breath, life threatening emergency, suicidal or homicidal thoughts you must seek medical attention immediately by calling 911 or calling your MD immediately  if symptoms less severe.  You Must read complete instructions/literature along with all the possible adverse reactions/side effects for all the Medicines you take and that have been prescribed to you. Take any new Medicines after you have completely understood and accpet all the possible adverse reactions/side effects.   Do not drive, operating heavy machinery, perform activities at heights, swimming or participation in water activities or provide baby sitting services if your were admitted for syncope or siezures until you have seen by Primary MD or a Neurologist and advised to do so again.  Do not drive when taking Pain medications.    Do not take more than prescribed Pain, Sleep and Anxiety Medications  Special Instructions: If you have smoked or chewed Tobacco  in the last 2 yrs  please stop smoking, stop any regular Alcohol  and or any Recreational drug use.  Wear Seat belts while driving.   Please note  You were cared for by a hospitalist during your hospital stay. If you have any questions about your discharge medications or the care you received while you were in the hospital after you are discharged, you can call the unit and asked to speak with the hospitalist on call if the hospitalist that took care of you is not available. Once you are discharged, your primary care physician will handle any further medical issues. Please note that NO REFILLS for any discharge medications will be authorized once you are discharged, as it is imperative that you return to your primary care physician (or establish a relationship with a primary care physician if you do not have one) for your aftercare needs so that they can reassess your need for medications and monitor your lab values.

## 2018-12-14 ENCOUNTER — Telehealth: Payer: Self-pay

## 2018-12-14 NOTE — Telephone Encounter (Signed)
   [facility] Pre-operative Risk Assessment    Request for surgical clearance:  1. What type of surgery is being performed? Laparoscopic PD catheter   2. When is this surgery scheduled? 12/19/18   3. What type of clearance is required (medical clearance vs. Pharmacy clearance to hold med vs. Both)?  Both  4. Are there any medications that need to be held prior to surgery and how long? Aspirin  5. Practice name and name of physician performing surgery? [facility]  6. What is your office phone number [phone]   7.   What is your office fax number [phone]  8.   Anesthesia type  Not listed   Kathyrn Lass 12/14/2018, 5:52 PM  _________________________________________________________________   (provider comments below)

## 2018-12-15 NOTE — Telephone Encounter (Signed)
Attempted phone call to pt for surgical clearance. No answer. Unable to leave VM. Will call back.  In the meantime, will route to Dr. Sallyanne Kuster to see if ok to hold ASA for procedure.

## 2018-12-15 NOTE — Telephone Encounter (Signed)
Yes, OK to hold ASA MCr

## 2018-12-16 MED FILL — ESCITALOPRAM 20 MG TABLET: 20 | 30 days supply | Qty: 30 | Fill #1

## 2018-12-16 MED FILL — hydrALAZINE HCL 50 MG TABS: 50 | 30 days supply | Qty: 60 | Fill #2

## 2018-12-16 NOTE — Telephone Encounter (Signed)
   Primary Cardiologist: Sanda Klein, MD  Chart reviewed as part of pre-operative protocol coverage. Patient was contacted 12/16/2018 in reference to pre-operative risk assessment for pending surgery as outlined below.  Patricia [name] was last seen on 9/3/219 by Dr. Sallyanne Kuster.  Since that day, Patricia [name] has done well w/o any cardiac symptoms. Denies CP and dyspnea.   Therefore, based on ACC/AHA guidelines, the patient would be at acceptable risk for the planned procedure without further cardiovascular testing.   Ok to hold ASA 5 days prior. Resume once safe to do so from a surgical standpoint.   I will route this recommendation to the requesting party via Epic fax function and remove from pre-op pool.  Please call with questions.  Lyda Jester, PA-C 12/16/2018, 10:01 AM

## 2018-12-27 ENCOUNTER — Encounter (HOSPITAL_COMMUNITY): Payer: Medicaid Other

## 2018-12-28 ENCOUNTER — Inpatient Hospital Stay: Payer: Medicaid Other | Admitting: Critical Care Medicine

## 2018-12-28 NOTE — Progress Notes (Deleted)
Subjective:    Patient ID: Patricia [name], female    DOB: 17-Oct-1955, 63 y.o.   MRN: [id number]  62 y.o.F here for post hosp f/u  ESRD on PD, CHF chronic sys/diastolic, HTN, Anxiety/depression, T2DM with renal effect, anemia d/t ESRD,   Admission date:  12/04/2018  Admitting Physician  Ivor Costa, MD  Discharge Date:  12/07/2018   Primary MD  Ladell Pier, MD  Recommendations for primary care physician for things to follow:  -Please check CBC, BMP during next visit, patient to follow with nephrology next week.   Admission Diagnosis  dif fbreathing when lying down   Discharge Diagnosis  dif fbreathing when lying down    Principal Problem:   Fluid overload Active Problems:   Dyslipidemia   Essential hypertension   Anxiety and depression   Acute on chronic combined systolic and diastolic CHF (congestive heart failure) (HCC)   CKD (chronic kidney disease) stage 5, GFR less than 15 ml/min (HCC)   Anemia due to stage 5 chronic kidney disease, not on chronic dialysis ([place])   Type II diabetes mellitus with renal manifestations (HCC)   Hypoglycemia   CHF (congestive heart failure) ([place])      Past Medical History: Diagnosis Date . Allergy  . Anemia  . Anxiety  . Blood transfusion without reported diagnosis  . Cardiomyopathy ([place]) 11/2016  no ischemic eval due to CKD . CHF (congestive heart failure) ([place])  . CKD (chronic kidney disease), stage IV ([place])  . Depression  . Diabetes mellitus without complication ([place])   type 2 . Dyspnea  . Hypertension  . Obesity    Past Surgical History: Procedure Laterality Date . ABDOMINAL HYSTERECTOMY   . BREAST SURGERY    reduction . [place] . LIPOMA EXCISION    back  Dr. Excell Seltzer 03-22-18 . LIPOMA EXCISION N/A 03/22/2018  Procedure: EXCISION OF BACK LIPOMA;  Surgeon: Excell Seltzer, MD;  Location: [facility];  Service: General;  Laterality: N/A; . REDUCTION MAMMAPLASTY  Bilateral       History of present illness and  Hospital Course:     Kindly see H&P for history of present illness and admission details, please review complete Labs, Consult reports and Test reports for all details in brief  HPI  from the history and physical done on the day of admission 12/05/2018  Patricia [name] a 63 y.o.femalewith medical history significant ofCKD-5, hypertension, hyperlipidemia, diabetes mellitus, anemia, CHF with EF of 45%, obesity, depression with anxiety, who presents with shortness of breath.  Patient states that she has been having shortness of breath for almost 3 weeks, which has been progressively worsening. She has cough with clear mucus production. She also has bilateral leg edema. She does not have chest pain, but reports that she has chest pressure when laying down. She has orthopnea. No fever,but has chills. Patient denies nausea, vomiting, diarrhea, abdominal pain, symptoms of UTI. Patient states she has decreased urine output recently. Patient states that she is taking Lasix 80 mg daily, today she took extra dose of Lasix without help. Pt states her renal doctor, Dr. Joelyn Oms is planing to startperitoneal dialysis soon.  ED Course:pt was found to have negative troponin, WBC 7.3, renal function close to baseline, potassium 4.0, blood sugar 67, temperature normal, no tachycardia, no tachypnea, oxygen saturation 99% on room air, chest x-ray showed interstitial pulmonary edema and cardiomegaly with a small pleural effusion. Patient is placed on telemetry bed for observation.  Hospital Course  Patricia [name] a45 year old African-American female, with past medical history significant forCKD-5, hypertension, hyperlipidemia, diabetes mellitus, anemia, CHF with EF of 45%, obesity, depressionandanxiety. Patient presented with shortness of breath, dyspnea on exertion, orthopnea and worsening edema. Patient has been admitted for  management of pulmonary edema/CHF/volume overload. Nephrology input is highly appreciated. 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 Patient is responding to IV Lasix.  Transition to oral Lasix, appears to be a euvolemic today, she will be discharged on increased dose of Lasix, with outpatient follow-up with renal, patient likely will start peritoneal dialysis as an outpatient.  Pulmonary edema/acute on chronicchronic combined systolic and diastolic CHF/Volume overload: -Patient presents with significant volume overload in the setting of advanced CKD stage V, she was started on IV Lasix with good improvement of respiratory status, she is on room air today, with no evidence of volume overload, she will be discharged on increased dose of Lasix 80 mg oral twice daily, with fluid restriction and low-salt diet. -Patient supposed to have an outpatient consultation today for peritoneal dialysis, have discussed with renal, they will D/W general surgery as an outpatient to reschedule the appointment, possibly for Friday.  CKD (chronic kidney disease) stage 5, GFR less than 15 ml/min ([place]): - Likely approached ESRD. -Peritoneal dialysis is planned. -Anemia of CKD and CKD-MBD as per Nephrology. -On Calcitriol and Sevelamer.  Dyslipidemia: -lipitor  HTN:  -Optimized. -Continue home medications:Hydralazine, Coreg, ISMN. -IV hydralazine prn  Anxiety and depression: -onLexapro  Type II diabetes mellitus with renal manifestations ([name]): Last A1c6.3 on 07/15/18. Patient was kept on insulin sliding scale during hospital stay, all her blood sugar has been controlled, with no need for insulin, so her home regimen has been adjusted, I have stopped her oral agent, and decrease her Lantus to 4 units subcu daily.  Hypoglycemia: -Blood sugar of 54 documented on 12/05/2018 -No further hypoglycemia noted.  Anemia of chronic kidney disease -Most recent  hemoglobin was 7.3, she denies any dyspnea or chest pain today, receives Aranesp as an outpatient      Review of Systems     Objective:   Physical Exam        Assessment & Plan:

## 2018-12-29 ENCOUNTER — Telehealth: Payer: Self-pay | Admitting: Cardiovascular Disease

## 2018-12-29 NOTE — Telephone Encounter (Signed)
Records received from [facility] on 12/29/18, Appt 01/20/19 @ 9:40AM. [place]

## 2019-01-05 ENCOUNTER — Other Ambulatory Visit: Payer: Self-pay | Admitting: Internal Medicine

## 2019-01-05 DIAGNOSIS — Z794 Long term (current) use of insulin: Principal | ICD-10-CM

## 2019-01-05 DIAGNOSIS — E1165 Type 2 diabetes mellitus with hyperglycemia: Secondary | ICD-10-CM

## 2019-01-12 ENCOUNTER — Other Ambulatory Visit: Payer: Self-pay | Admitting: Internal Medicine

## 2019-01-12 DIAGNOSIS — E1165 Type 2 diabetes mellitus with hyperglycemia: Secondary | ICD-10-CM

## 2019-01-12 DIAGNOSIS — Z794 Long term (current) use of insulin: Principal | ICD-10-CM

## 2019-01-17 ENCOUNTER — Telehealth: Payer: Self-pay

## 2019-01-17 NOTE — Telephone Encounter (Signed)
lmtcb

## 2019-01-18 NOTE — Telephone Encounter (Signed)
Patient requested that I call her back in the afternoon to discuss appointment details.

## 2019-01-19 ENCOUNTER — Telehealth: Payer: Self-pay | Admitting: Cardiovascular Disease

## 2019-01-19 NOTE — Telephone Encounter (Signed)
LVM for pre reg. 01-19-19 ST

## 2019-01-20 ENCOUNTER — Telehealth (INDEPENDENT_AMBULATORY_CARE_PROVIDER_SITE_OTHER): Payer: Medicaid Other | Admitting: Cardiovascular Disease

## 2019-01-20 ENCOUNTER — Telehealth: Payer: Self-pay | Admitting: Cardiovascular Disease

## 2019-01-20 ENCOUNTER — Encounter: Payer: Self-pay | Admitting: Cardiovascular Disease

## 2019-01-20 DIAGNOSIS — I5043 Acute on chronic combined systolic (congestive) and diastolic (congestive) heart failure: Secondary | ICD-10-CM | POA: Diagnosis not present

## 2019-01-20 DIAGNOSIS — N186 End stage renal disease: Secondary | ICD-10-CM | POA: Diagnosis not present

## 2019-01-20 DIAGNOSIS — E1121 Type 2 diabetes mellitus with diabetic nephropathy: Secondary | ICD-10-CM

## 2019-01-20 DIAGNOSIS — I110 Hypertensive heart disease with heart failure: Secondary | ICD-10-CM

## 2019-01-20 DIAGNOSIS — E118 Type 2 diabetes mellitus with unspecified complications: Secondary | ICD-10-CM | POA: Diagnosis not present

## 2019-01-20 DIAGNOSIS — Z7189 Other specified counseling: Secondary | ICD-10-CM

## 2019-01-20 DIAGNOSIS — I10 Essential (primary) hypertension: Secondary | ICD-10-CM

## 2019-01-20 DIAGNOSIS — R9431 Abnormal electrocardiogram [ECG] [EKG]: Secondary | ICD-10-CM

## 2019-01-20 DIAGNOSIS — I429 Cardiomyopathy, unspecified: Secondary | ICD-10-CM

## 2019-01-20 DIAGNOSIS — Z992 Dependence on renal dialysis: Secondary | ICD-10-CM

## 2019-01-20 NOTE — Patient Instructions (Addendum)
Medication Instructions:  The current medical regimen is effective;  continue present plan and medications.  If you need a refill on your cardiac medications before your next appointment, please call your pharmacy.   Follow-Up: At [facility], you and your health needs are our priority.  As part of our continuing mission to provide you with exceptional heart care, we have created designated Provider Care Teams.  These Care Teams include your primary Cardiologist (physician) and Advanced Practice Providers (APPs -  Physician Assistants and Nurse Practitioners) who all work together to provide you with the care you need, when you need it. You will need a follow up appointment in 4 months.  Please call our office 2 months in advance to schedule this appointment.  You may see Sanda Klein, MD or one of the following Advanced Practice Providers on your designated Care Team: [place], [place] . Fabian Sharp, PA-C  Any Other Special Instructions Will Be Listed Below (If Applicable). Please call us if Systolic (top number) BP less than 818 or Diastolic(bottom number) BP less than 60

## 2019-01-20 NOTE — Telephone Encounter (Signed)
Consent given for phone visit. 01-20-19 ST

## 2019-01-20 NOTE — Telephone Encounter (Signed)
LVM for pre reg. 01-18-19 ST LVM for pre reg. 01-19-19 ST LVM for pre reg 01-20-19 ST

## 2019-01-20 NOTE — Progress Notes (Signed)
Virtual Visit via Video Note   This visit type was conducted due to national recommendations for restrictions regarding the COVID-19 Pandemic (e.g. social distancing) in an effort to limit this patient's exposure and mitigate transmission in our community.  Due to her co-morbid illnesses, this patient is at least at moderate risk for complications without adequate follow up.  This format is felt to be most appropriate for this patient at this time.  All issues noted in this document were discussed and addressed.  A limited physical exam was performed with this format.  Please refer to the patient's chart for her consent to telehealth for [facility].   Evaluation Performed:  Follow-up visit  Date:  01/20/2019   ID:  Patricia [name], Patricia [name] July 27, 1956, MRN [id number]  Patient Location: Other:  dialysis session  Provider Location: Home  PCP:  Ladell Pier, MD  Cardiologist:  Sanda Klein, MD  Electrophysiologist:  None   Chief Complaint:  CHF follow up  History of Present Illness:    Patricia [name] is a 63 y.o. female who presents via audio/video conferencing for a telehealth visit today.    Patricia [name] has a longstanding history of cardiomyopathy of uncertain etiology, with chronic combined systolic and diastolic heart failure.  She has never had angina pectoris but is at risk of for coronary disease due to longstanding diabetes mellitus, controlled, complicated by neuropathy and chronic kidney disease.  She just started peritoneal dialysis a few days ago and is actually in the middle of a session doing our conversation.  Her two daughters are with her during this video conference.    She has severe exertional dyspnea and has mild dyspnea at rest when lying supine.  She does not have significant leg edema.  She now weighs 20 pounds less than she did in January.  She denies dizziness, lightheadedness, palpitations or syncope.  She again denies any chest discomfort.  Her peritoneal  dialysis catheter was placed on March 10 (she also had a umbilical hernia repair at that time) and dialysis training on April 6.  Today is her third session. She is markedly anemic with a hemoglobin of only 7.3 when last checked in late February.  She is received an iron infusion today.  She has started on erythropoietin supplementation.  She was hospitalized in late February with pulmonary edema.  She also had a lot of peripheral edema at the time.  She received intravenous diuretics with improvement, but it was clear that she will require dialysis for long-term management.  An echocardiogram performed on February 20 showed left ventricular ejection fraction of 40-45%.  This is comparable to the nuclear scintigraphy value of 40% in July 2018, but higher than her previous echo in February 2018 (25 -30%).  She continues treatment with carvedilol and high-dose as well as hydralazine/nitrates.  She takes aspirin and statin.  Most recent hemoglobin A1c was excellent at 6.3%.  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  She has significant diabetic polyneuropathy.  Her anemia is related to advanced kidney disease, but she also has a history of diverticulosis and antral gastritis (H. pylori negative).  The patient does not have symptoms concerning for COVID-19 infection (fever, chills, cough, or new shortness of breath).    Past Medical History:  Diagnosis Date  . Allergy   . Anemia   . Anxiety   . Blood transfusion without  reported diagnosis   . Cardiomyopathy ([place]) 11/2016   no ischemic eval due to CKD  . CHF (congestive heart failure) ([place])   . CKD (chronic kidney disease), stage IV ([place])   . Depression   . Diabetes mellitus without complication ([place])    type 2  . Dyspnea   . Hypertension   . Obesity    Past Surgical History:  Procedure Laterality Date  . ABDOMINAL HYSTERECTOMY    .  BREAST SURGERY     reduction  . [place]  . LIPOMA EXCISION     back  Dr. Excell Seltzer 03-22-18  . LIPOMA EXCISION N/A 03/22/2018   Procedure: EXCISION OF BACK LIPOMA;  Surgeon: Excell Seltzer, MD;  Location: [facility];  Service: General;  Laterality: N/A;  . REDUCTION MAMMAPLASTY Bilateral      Current Meds  Medication Sig  . ACCU-CHEK FASTCLIX LANCETS MISC Uad tid  . acetaminophen (TYLENOL) 325 MG tablet Take 2 tablets (650 mg total) by mouth every 4 (four) hours as needed for headache or mild pain.  Marland Kitchen albuterol (PROVENTIL HFA;VENTOLIN HFA) 108 (90 Base) MCG/ACT inhaler Inhale 1-2 puffs into the lungs every 6 (six) hours as needed for wheezing or shortness of breath.  Marland Kitchen aspirin EC 81 MG tablet Take 1 tablet (81 mg total) by mouth daily.  Marland Kitchen atorvastatin (LIPITOR) 20 MG tablet Take 0.5 tablets (10 mg total) by mouth daily.  [name] 550 MG CAPS Take 550 mg by mouth daily as needed (to boost the immune system).   . Blood Glucose Monitoring Suppl (ACCU-CHEK GUIDE) w/Device KIT 1 each by Does not apply route 3 (three) times daily. Use tid as directed  . calcium carbonate (TUMS - DOSED IN MG ELEMENTAL CALCIUM) 500 MG chewable tablet Chew 1 tablet by mouth 3 (three) times daily as needed for indigestion or heartburn.   . carvedilol (COREG) 25 MG tablet TAKE 1 TABLET BY MOUTH TWICE DAILY WITH A MEAL (Patient taking differently: Take 25 mg by mouth 2 (two) times daily with a meal. )  . escitalopram (LEXAPRO) 20 MG tablet TAKE 1 TABLET BY MOUTH AT BEDTIME. (Patient taking differently: Take 20 mg by mouth at bedtime. )  . fluticasone (FLONASE) 50 MCG/ACT nasal spray Place 1 spray into both nostrils daily.  . furosemide (LASIX) 80 MG tablet Take 1 tablet (80 mg total) by mouth 2 (two) times daily.  Marland Kitchen gabapentin (NEURONTIN) 100 MG capsule TAKE 1 CAPSULE (100 MG TOTAL) BY MOUTH AT BEDTIME.  . Garlic 147 MG TABS Take 100 mg by mouth daily.   Marland Kitchen glucose 4 GM chewable tablet Chew 1  tablet by mouth as needed for low blood sugar.  Marland Kitchen glucose blood (ACCU-CHEK GUIDE) test strip Use as instructed tid  . hydrALAZINE (APRESOLINE) 50 MG tablet Take 1 tablet (50 mg total) by mouth 2 (two) times daily.  . insulin aspart (NOVOLOG) 100 UNIT/ML injection 3 units before meals. (Patient taking differently: Inject 5 Units into the skin 3 (three) times daily as needed (before meals for high blood sugar levels). )  . Insulin Glargine (LANTUS SOLOSTAR) 100 UNIT/ML Solostar Pen Inject 4 Units into the skin at bedtime.  . Insulin Pen Needle (TRUEPLUS PEN NEEDLES) 32G X 4 MM MISC Use as directed to inject insulin.  . Insulin Pen Needle 31G X 5 MM MISC Use as directed  . isosorbide mononitrate (IMDUR) 30 MG 24 hr tablet TAKE 1/2 TABLET BY MOUTH DAILY. (Patient taking differently: Take 15  mg by mouth daily. )  . RENAGEL 800 MG tablet Take 1,600 mg by mouth 3 (three) times daily with meals.     Allergies:   Codone [hydrocodone]; Norvasc [amlodipine besylate]; and Sulfa antibiotics   Social History   Tobacco Use  . Smoking status: Never Smoker  . Smokeless tobacco: Never Used  Substance Use Topics  . Alcohol use: No  . Drug use: No     Family Hx: The patient's family history includes Diabetes in her brother; Heart Problems in her maternal grandmother; Heart disease in her maternal grandfather; Kidney disease in her brother; Stomach cancer in her brother. There is no history of Rectal cancer, Esophageal cancer, Liver cancer, or Colon cancer.  ROS:   Please see the history of present illness.     All other systems reviewed and are negative.   Prior CV studies:   The following studies were reviewed today: Notes from hospitalization February 2028.  Echocardiogram images from February 2020.  Labs, notes from Dr. Joelyn Oms and notes from the surgical team at [facility] for PD catheter implantation  Labs/Other Tests and Data Reviewed:    EKG:  An ECG dated 12/05/2018 was personally  reviewed today and demonstrated:  Sinus rhythm, nonspecific T wave changes, prolonged QTc interval 500 ms  Recent Labs: 06/02/2018: Magnesium 2.1 12/05/2018: B Natriuretic Peptide 1,901.1 12/06/2018: Hemoglobin 7.3; Platelets 146 12/07/2018: BUN 79; Creatinine, Ser 7.06; Potassium 4.3; Sodium 138   Recent Lipid Panel Lab Results  Component Value Date/Time   CHOL 144 12/01/2016 06:16 AM   TRIG 129 12/01/2016 06:16 AM   HDL 36 (L) 12/01/2016 06:16 AM   CHOLHDL 4.0 12/01/2016 06:16 AM   LDLCALC 82 12/01/2016 06:16 AM    Wt Readings from Last 3 Encounters:  01/20/19 171 lb 4.8 oz (77.7 kg)  12/07/18 179 lb 12.8 oz (81.6 kg)  11/15/18 181 lb (82.1 kg)     Objective:    Vital Signs:  BP (!) 152/73   Pulse 84   Wt 171 lb 4.8 oz (77.7 kg)   BMI 29.40 kg/m    Well nourished, well developed female in no acute distress.  She is wearing a facemask since she is been educated about peritoneal dialysis.  She is breathing slowly and comfortably.  She can speak without interrupting her sentences.  Otherwise limited physical exam due to the conditions of the meeting and body habitus.   ASSESSMENT & PLAN:    1. CHF: She continues to have symptoms of hypervolemia including orthopnea and will require additional volume removal with peritoneal dialysis.  I do not think we yet know what her true "dry weight" is.  She should continue carvedilol, hydralazine/nitrates. 2. CMP: The cause of her cardiomyopathy remains uncertain and she has numerous risk factors for coronary artery disease but she has never had angina.  Her nuclear stress test in July 2018 did not show evidence of ischemia. 3. HTN: Her blood pressure is elevated, but she is just starting hemodialysis.  I suspect that her blood pressure will decrease and we might actually have to reduce the doses of her heart failure medications as we approach her dry weight.  110-130/70-80 target BP. 4. ESRD: Currently on her third training session for  peritoneal dialysis. 5. Anemia: Etiology of iron deficiency in the erythropoietin deficiency.  Both the family being addressed.  The anemia makes it more difficult to manage the symptoms of heart failure. 6. DM: Excellent control. 7. Long QT: She does not have  a history of ventricular arrhythmia or family history of sudden death, but it is still important to avoid medications with QT prolonging effects.  COVID-19 Education: The signs and symptoms of COVID-19 were discussed with the patient and how to seek care for testing (follow up with PCP or arrange E-visit).  The importance of social distancing was discussed today.  Time:   Today, I have spent 22 minutes with the patient with telehealth technology discussing the above problems.     Medication Adjustments/Labs and Tests Ordered: Current medicines are reviewed at length with the patient today.  Concerns regarding medicines are outlined above.  Tests Ordered: No orders of the defined types were placed in this encounter.  Medication Changes: No orders of the defined types were placed in this encounter.   Disposition:  Follow up August 2020  SignedSanda Klein, MD  01/20/2019 2:30 PM    [place]

## 2019-01-26 ENCOUNTER — Telehealth: Payer: Self-pay | Admitting: Internal Medicine

## 2019-01-26 NOTE — Telephone Encounter (Signed)
Will forward to pcp

## 2019-01-26 NOTE — Telephone Encounter (Signed)
Faxed order to [employer].

## 2019-01-26 NOTE — Telephone Encounter (Signed)
New Message   Pt and nurse case manager is requesting an order for a shower chair. Please f/u

## 2019-02-06 ENCOUNTER — Other Ambulatory Visit: Payer: Self-pay

## 2019-02-06 ENCOUNTER — Ambulatory Visit: Payer: Medicaid Other | Attending: Family Medicine | Admitting: Family Medicine

## 2019-02-06 DIAGNOSIS — Z992 Dependence on renal dialysis: Secondary | ICD-10-CM | POA: Diagnosis not present

## 2019-02-06 DIAGNOSIS — Z794 Long term (current) use of insulin: Secondary | ICD-10-CM | POA: Diagnosis not present

## 2019-02-06 DIAGNOSIS — E1121 Type 2 diabetes mellitus with diabetic nephropathy: Secondary | ICD-10-CM | POA: Diagnosis not present

## 2019-02-06 DIAGNOSIS — N186 End stage renal disease: Secondary | ICD-10-CM | POA: Diagnosis not present

## 2019-02-06 NOTE — Progress Notes (Signed)
Per pt her sugar this morning at home was 104   Per pt she is doing good today

## 2019-02-06 NOTE — Progress Notes (Signed)
Virtual Visit via Telephone Note  I connected with Patricia [name] on 02/06/19 at  2:50 PM EDT by telephone and verified that I am speaking with the correct person using two identifiers.   I discussed the limitations, risks, security and privacy concerns of performing an evaluation and management service by telephone and the availability of in person appointments. I also discussed with the patient that there may be a patient responsible charge related to this service. The patient expressed understanding and agreed to proceed.  Patient Location: Home Provider Location: Office Others participating in call: Emilio Aspen, CMA   History of Present Illness:       63 year old female who is a patient of Dr. Wynetta Emery who reports that her blood sugars have been well controlled and she generally does not use insulin at this point.  Patient states that she recently started use of peritoneal dialysis which she does every night.  Patient has had some blood sugars that were in the high 90s in the mornings after dialysis and patient will get something to eat.  Patient states that overall she feels well.  She denies any increased thirst and no blurred vision.  Patient states that she does continue to make urine and has had no issues with frequency or dysuria.  She has had no issues with shortness of breath or swelling in her legs since she started the use of dialysis.  She states that she is actually feeling much better.   Past Medical History:  Diagnosis Date  . Allergy   . Anemia   . Anxiety   . Blood transfusion without reported diagnosis   . Cardiomyopathy ([place]) 11/2016   no ischemic eval due to CKD  . CHF (congestive heart failure) ([place])   . CKD (chronic kidney disease), stage IV ([place])   . Depression   . Diabetes mellitus without complication ([place])    type 2  . Dyspnea   . Hypertension   . Obesity     Past Surgical History:  Procedure Laterality Date  . ABDOMINAL HYSTERECTOMY    . BREAST  SURGERY     reduction  . [place]  . LIPOMA EXCISION     back  Dr. Excell Seltzer 03-22-18  . LIPOMA EXCISION N/A 03/22/2018   Procedure: EXCISION OF BACK LIPOMA;  Surgeon: Excell Seltzer, MD;  Location: [facility];  Service: General;  Laterality: N/A;  . REDUCTION MAMMAPLASTY Bilateral     Family History  Problem Relation Age of Onset  . Diabetes Brother   . Stomach cancer Brother   . Kidney disease Brother   . Heart Problems Maternal Grandmother        pacemaker  . Heart disease Maternal Grandfather   . Rectal cancer Neg Hx   . Esophageal cancer Neg Hx   . Liver cancer Neg Hx   . Colon cancer Neg Hx     Social History   Tobacco Use  . Smoking status: Never Smoker  . Smokeless tobacco: Never Used  Substance Use Topics  . Alcohol use: No  . Drug use: No     Allergies  Allergen Reactions  . Codone [Hydrocodone] Itching  . Norvasc [Amlodipine Besylate] Swelling    Lower extremity?  . Sulfa Antibiotics Itching and Rash    Review of Systems  Constitutional: Negative for chills and fever.  HENT: Negative for congestion and sore throat.   Eyes: Negative for blurred vision and double vision.  Respiratory: Negative for cough and shortness  of breath.   Cardiovascular: Negative for chest pain and palpitations.  Gastrointestinal: Negative for abdominal pain, constipation, diarrhea, nausea and vomiting.  Genitourinary: Negative for dysuria, frequency and urgency.  Musculoskeletal: Negative for joint pain and myalgias.  Skin: Negative for itching and rash.  Neurological: Negative for dizziness and headaches.  Endo/Heme/Allergies: Negative for polydipsia. Does not bruise/bleed easily.     Observations/Objective: No vital signs or physical exam conducted as visit was done via telephone  Assessment and Plan: 1. Controlled type 2 diabetes mellitus with diabetic nephropathy, with long-term current use of insulin ([place]) Patient's hemoglobin A1c in January of this  year was 6.0.  Patient reports that she now rarely uses insulin.  I discussed with the patient that due to her worsening renal function, insulin would tend to stay in her system longer and could cause hypoglycemia so she should be cautious with the use of insulin and does not require insulin at this time per sugars are staying less than 140 she should contact her primary care provider. Patient was asked to make lab visit for Hgb A1c in about 4 weeks unless there is another spike in COVID-19 cases at that time. - Hemoglobin A1c; Future  2.  End-stage renal disease requiring dialysis Patient reports that she recently started at home overnight peritoneal dialysis and feels much better and that she has to go to dialysis center later this week for blood work which is being monitored by her nephrologist.   Follow Up Instructions: about 4 weeks for lab visit for Hgb A1c and 4 months with PCP    I discussed the assessment and treatment plan with the patient. The patient was provided an opportunity to ask questions and all were answered. The patient agreed with the plan and demonstrated an understanding of the instructions.   The patient was advised to call back or seek an in-person evaluation if the symptoms worsen or if the condition fails to improve as anticipated.  I provided 11  minutes of non-face-to-face time during this encounter.   Antony Blackbird, MD

## 2019-02-07 ENCOUNTER — Encounter: Payer: Self-pay | Admitting: Family Medicine

## 2019-02-09 ENCOUNTER — Other Ambulatory Visit: Payer: Self-pay | Admitting: Nephrology

## 2019-02-09 ENCOUNTER — Ambulatory Visit
Admission: RE | Admit: 2019-02-09 | Discharge: 2019-02-09 | Disposition: A | Discharge status: Home / Self Care | Payer: Medicaid Other | Source: Ambulatory Visit | Attending: Nephrology | Admitting: Nephrology

## 2019-02-09 DIAGNOSIS — Z111 Encounter for screening for respiratory tuberculosis: Secondary | ICD-10-CM

## 2019-02-16 MED FILL — GABAPENTIN 100 MG CAP: 100 | 30 days supply | Qty: 30 | Fill #1

## 2019-02-16 MED FILL — ESCITALOPRAM 20 MG TABLET: 20 | 30 days supply | Qty: 30 | Fill #2

## 2019-02-16 MED FILL — ACCU-CHEK GUIDE TEST STRIP: 33 days supply | Qty: 100 | Fill #1

## 2019-03-02 ENCOUNTER — Telehealth: Payer: Self-pay

## 2019-03-02 NOTE — Telephone Encounter (Signed)
   [facility] Pre-operative Risk Assessment    Request for surgical clearance:  1. What type of surgery is being performed? Cataract Surgery (called office to confirm surgery as it wasn't listed on clearance form).  2. When is this surgery scheduled? TBD   3. What type of clearance is required (medical clearance vs. Pharmacy clearance to hold med vs. Both)? Medical  4. Are there any medications that need to be held prior to surgery and how long? Not specified    5. Practice name and name of physician performing surgery? [facility] ( Dr. Tama High)  6. What is your office phone number [phone] ext. [phone]    7.   What is your office fax number [phone]  8.   Anesthesia type (None, local, MAC, general) ?  IV sedation   Meryl Crutch 03/02/2019, 2:54 PM  _________________________________________________________________   (provider comments below)

## 2019-03-03 NOTE — Telephone Encounter (Signed)
   Primary Cardiologist: Sanda Klein, MD  Chart reviewed as part of pre-operative protocol coverage. Cataract extractions are recognized in guidelines as low risk surgeries that do not typically require specific preoperative testing or holding of blood thinner therapy. Therefore, given past medical history and time since last visit, based on ACC/AHA guidelines, Patricia [name] would be at acceptable risk for the planned procedure without further cardiovascular testing.   I will route this recommendation to the requesting party via Epic fax function and remove from pre-op pool.  Please call with questions.  Abigail Butts, PA-C 03/03/2019, 8:54 AM

## 2019-04-24 ENCOUNTER — Other Ambulatory Visit: Payer: Self-pay | Admitting: Internal Medicine

## 2019-04-24 ENCOUNTER — Other Ambulatory Visit: Payer: Self-pay | Admitting: Family Medicine

## 2019-04-24 DIAGNOSIS — F419 Anxiety disorder, unspecified: Secondary | ICD-10-CM

## 2019-04-24 DIAGNOSIS — E785 Hyperlipidemia, unspecified: Secondary | ICD-10-CM

## 2019-04-24 DIAGNOSIS — F329 Major depressive disorder, single episode, unspecified: Secondary | ICD-10-CM

## 2019-04-24 MED FILL — GABAPENTIN 100 MG CAP: 100 | 30 days supply | Qty: 30 | Fill #2

## 2019-04-25 MED FILL — ESCITALOPRAM 20 MG TABLET: 20 | 30 days supply | Qty: 30 | Fill #0

## 2019-05-04 ENCOUNTER — Other Ambulatory Visit: Payer: Self-pay | Admitting: Student

## 2019-05-16 ENCOUNTER — Telehealth: Payer: Self-pay | Admitting: Internal Medicine

## 2019-05-16 MED FILL — GABAPENTIN 100 MG CAP: 100 | 30 days supply | Qty: 30 | Fill #2

## 2019-05-16 MED FILL — ESCITALOPRAM 20 MG TABLET: 20 | 30 days supply | Qty: 30 | Fill #0

## 2019-05-16 NOTE — Telephone Encounter (Signed)
Santiago Glad with [facility] called to inform patient was in distress while on the phone and tearful and would like to know if patient could be provided additional resources in her home life. Please follow up.

## 2019-05-17 NOTE — Telephone Encounter (Signed)
Will forward to [facility] to see if she is able to follow up with pt and will CC pcp

## 2019-05-17 NOTE — Telephone Encounter (Signed)
yahir please contact pt and schedule an appointment in person(morning time)

## 2019-05-19 NOTE — Telephone Encounter (Signed)
Call placed to patient to follow up on consult. LCSW left message for a return call.

## 2019-05-23 ENCOUNTER — Encounter: Payer: Self-pay | Admitting: Cardiovascular Disease

## 2019-05-23 ENCOUNTER — Telehealth (INDEPENDENT_AMBULATORY_CARE_PROVIDER_SITE_OTHER): Payer: Medicare Other | Admitting: Cardiovascular Disease

## 2019-05-23 VITALS — BP 137/78 | HR 83 | Ht 64.0 in | Wt 171.3 lb

## 2019-05-23 DIAGNOSIS — N185 Chronic kidney disease, stage 5: Secondary | ICD-10-CM

## 2019-05-23 DIAGNOSIS — Z794 Long term (current) use of insulin: Secondary | ICD-10-CM

## 2019-05-23 DIAGNOSIS — Z992 Dependence on renal dialysis: Secondary | ICD-10-CM

## 2019-05-23 DIAGNOSIS — E1121 Type 2 diabetes mellitus with diabetic nephropathy: Secondary | ICD-10-CM

## 2019-05-23 DIAGNOSIS — I429 Cardiomyopathy, unspecified: Secondary | ICD-10-CM | POA: Diagnosis not present

## 2019-05-23 DIAGNOSIS — E785 Hyperlipidemia, unspecified: Secondary | ICD-10-CM

## 2019-05-23 DIAGNOSIS — D631 Anemia in chronic kidney disease: Secondary | ICD-10-CM

## 2019-05-23 DIAGNOSIS — N186 End stage renal disease: Secondary | ICD-10-CM

## 2019-05-23 DIAGNOSIS — E118 Type 2 diabetes mellitus with unspecified complications: Secondary | ICD-10-CM

## 2019-05-23 DIAGNOSIS — I5042 Chronic combined systolic (congestive) and diastolic (congestive) heart failure: Secondary | ICD-10-CM | POA: Diagnosis not present

## 2019-05-23 DIAGNOSIS — E1122 Type 2 diabetes mellitus with diabetic chronic kidney disease: Secondary | ICD-10-CM

## 2019-05-23 DIAGNOSIS — R5383 Other fatigue: Secondary | ICD-10-CM

## 2019-05-23 DIAGNOSIS — I10 Essential (primary) hypertension: Secondary | ICD-10-CM

## 2019-05-23 NOTE — Progress Notes (Signed)
Virtual Visit via Video Note   This visit type was conducted due to national recommendations for restrictions regarding the COVID-19 Pandemic (e.g. social distancing) in an effort to limit this patient's exposure and mitigate transmission in our community.  Due to her co-morbid illnesses, this patient is at least at moderate risk for complications without adequate follow up.  This format is felt to be most appropriate for this patient at this time.  All issues noted in this document were discussed and addressed.  A limited physical exam was performed with this format.  Please refer to the patient's chart for her consent to telehealth for [facility].   Evaluation Performed:  Follow-up visit  Date:  05/23/2019   ID:  Patricia [name], Patricia [name] 1956-04-04, MRN [id number]  Patient Location: Other:  dialysis session  Provider Location: Home  PCP:  Ladell Pier, MD  Cardiologist:  Sanda Klein, MD  Electrophysiologist:  None   Chief Complaint:  CHF follow up  History of Present Illness:    Patricia [name] is a 63 y.o. female who presents via audio/video conferencing for a telehealth visit today.    Mrs. Suppa has a longstanding history of cardiomyopathy of uncertain etiology, with chronic combined systolic and diastolic heart failure.  She has never had angina pectoris but is at risk of for coronary disease due to longstanding diabetes mellitus, controlled, complicated by neuropathy and chronic kidney disease.  She has been on peritoneal dialysis for the last roughly 3-4 months.  Prior to initiation of dialysis she required specialization for pulmonary edema, she has not needed emergency medical care since that time.  She is doing better since starting dialysis and her only complaint is fatigue.  She feels rested when she wakes up in the morning but trying to do things makes her feel quickly tired.  She denies shortness of breath at rest or with activity, has only tiredness.  Denies dizziness  lightheadedness or syncope and does not have palpitations.  Has not had chest pain.  She is requiring very little in the way of treatment for diabetes and has had periods of hypoglycemia.  She only has urine output roughly once a day in the morning, but is still taking diuretics twice daily.  She is also on carvedilol and high-dose as well as hydralazine/nitrates, aspirin, statin  Per my records, the most recent level of hemoglobin was 7.3 back in February.  She is receiving erythropoietin and occasionally receives iron infusions as well.  Her nephrologist is Dr. Pearson Grippe.  She was hospitalized in late February with pulmonary edema.  She also had a lot of peripheral edema at the time.  She received intravenous diuretics with improvement, but it was clear that she will require dialysis for long-term management.  An echocardiogram performed on February 20 showed left ventricular ejection fraction of 40-45%.  This is comparable to the nuclear scintigraphy value of 40% in July 2018, but higher than her previous echo in February 2018 (25 -30%).  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  She has significant diabetic polyneuropathy.  Her anemia is related to advanced kidney disease, but she also has a history of diverticulosis and antral gastritis (H. pylori negative).  The patient does not have symptoms concerning for COVID-19 infection (fever, chills, cough, or new shortness of breath).    Past Medical History:  Diagnosis Date  . Allergy   .  Anemia   . Anxiety   . Blood transfusion without reported diagnosis   . Cardiomyopathy ([place]) 11/2016   no ischemic eval due to CKD  . CHF (congestive heart failure) ([place])   . CKD (chronic kidney disease), stage IV ([place])   . Depression   . Diabetes mellitus without complication ([place])    type 2  . Dyspnea   . Hypertension   . Obesity    Past  Surgical History:  Procedure Laterality Date  . ABDOMINAL HYSTERECTOMY    . BREAST SURGERY     reduction  . [place]  . LIPOMA EXCISION     back  Dr. Excell Seltzer 03-22-18  . LIPOMA EXCISION N/A 03/22/2018   Procedure: EXCISION OF BACK LIPOMA;  Surgeon: Excell Seltzer, MD;  Location: [facility];  Service: General;  Laterality: N/A;  . REDUCTION MAMMAPLASTY Bilateral      Current Meds  Medication Sig  . ACCU-CHEK FASTCLIX LANCETS MISC Uad tid  . acetaminophen (TYLENOL) 325 MG tablet Take 2 tablets (650 mg total) by mouth every 4 (four) hours as needed for headache or mild pain.  Marland Kitchen albuterol (PROVENTIL HFA;VENTOLIN HFA) 108 (90 Base) MCG/ACT inhaler Inhale 1-2 puffs into the lungs every 6 (six) hours as needed for wheezing or shortness of breath.  Marland Kitchen aspirin EC 81 MG tablet Take 1 tablet (81 mg total) by mouth daily.  Marland Kitchen atorvastatin (LIPITOR) 20 MG tablet Take 1/2 (one-half) tablet by mouth once daily  . Bee Pollen 550 MG CAPS Take 550 mg by mouth daily as needed (to boost the immune system).   . Blood Glucose Monitoring Suppl (ACCU-CHEK GUIDE) w/Device KIT 1 each by Does not apply route 3 (three) times daily. Use tid as directed  . calcitRIOL (ROCALTROL) 0.25 MCG capsule Take 0.25 mcg by mouth daily.   . calcium carbonate (TUMS - DOSED IN MG ELEMENTAL CALCIUM) 500 MG chewable tablet Chew 1 tablet by mouth 3 (three) times daily as needed for indigestion or heartburn.   . carvedilol (COREG) 25 MG tablet TAKE 1 TABLET BY MOUTH TWICE DAILY WITH A MEAL  . escitalopram (LEXAPRO) 20 MG tablet TAKE 1 TABLET BY MOUTH AT BEDTIME.  . fluticasone (FLONASE) 50 MCG/ACT nasal spray Place 1 spray into both nostrils daily.  . furosemide (LASIX) 80 MG tablet Take 1 tablet (80 mg total) by mouth 2 (two) times daily.  Marland Kitchen gabapentin (NEURONTIN) 100 MG capsule TAKE 1 CAPSULE (100 MG TOTAL) BY MOUTH AT BEDTIME.  . Garlic 481 MG TABS Take 100 mg by mouth daily.   Marland Kitchen glucose 4 GM chewable tablet Chew  1 tablet by mouth as needed for low blood sugar.  Marland Kitchen glucose blood (ACCU-CHEK GUIDE) test strip Use as instructed tid  . hydrALAZINE (APRESOLINE) 50 MG tablet Take 1 tablet (50 mg total) by mouth 2 (two) times daily.  . insulin aspart (NOVOLOG) 100 UNIT/ML injection 3 units before meals.  . Insulin Glargine (LANTUS SOLOSTAR) 100 UNIT/ML Solostar Pen Inject 4 Units into the skin at bedtime. (Patient taking differently: Inject 4 Units into the skin at bedtime. Taking 24 Units QPM)  . Insulin Pen Needle (TRUEPLUS PEN NEEDLES) 32G X 4 MM MISC Use as directed to inject insulin.  . Insulin Pen Needle 31G X 5 MM MISC Use as directed  . isosorbide mononitrate (IMDUR) 30 MG 24 hr tablet TAKE 1/2 TABLET BY MOUTH DAILY. (Patient taking differently: Take 15 mg by mouth daily. )  . RENAGEL 800 MG tablet  Take 1,600 mg by mouth 3 (three) times daily with meals.     Allergies:   Codone [hydrocodone], Norvasc [amlodipine besylate], and Sulfa antibiotics   Social History   Tobacco Use  . Smoking status: Never Smoker  . Smokeless tobacco: Never Used  Substance Use Topics  . Alcohol use: No  . Drug use: No     Family Hx: The patient's family history includes Diabetes in her brother; Heart Problems in her maternal grandmother; Heart disease in her maternal grandfather; Kidney disease in her brother; Stomach cancer in her brother. There is no history of Rectal cancer, Esophageal cancer, Liver cancer, or Colon cancer.  ROS:   Please see the history of present illness.     Systems are reviewed and are negative  Prior CV studies:   The following studies were reviewed today: Notes from hospitalization February 2028.  Echocardiogram images from February 2020.  Labs, notes from Dr. Joelyn Oms and notes from the surgical team at [facility] for PD catheter implantation  Labs/Other Tests and Data Reviewed:    EKG:  An ECG dated 12/05/2018 was personally reviewed today and demonstrated:  Sinus rhythm,  nonspecific T wave changes, prolonged QTc interval 500 ms.  ECG was not performed today  Recent Labs: 06/02/2018: Magnesium 2.1 12/05/2018: B Natriuretic Peptide 1,901.1 12/06/2018: Hemoglobin 7.3; Platelets 146 12/07/2018: BUN 79; Creatinine, Ser 7.06; Potassium 4.3; Sodium 138   Recent Lipid Panel Lab Results  Component Value Date/Time   CHOL 144 12/01/2016 06:16 AM   TRIG 129 12/01/2016 06:16 AM   HDL 36 (L) 12/01/2016 06:16 AM   CHOLHDL 4.0 12/01/2016 06:16 AM   LDLCALC 82 12/01/2016 06:16 AM    Wt Readings from Last 3 Encounters:  05/23/19 171 lb 4.8 oz (77.7 kg)  01/20/19 171 lb 4.8 oz (77.7 kg)  12/07/18 179 lb 12.8 oz (81.6 kg)     Objective:    Vital Signs:  BP 137/78   Pulse 83   Ht _0  (1.626 m)   Wt 171 lb 4.8 oz (77.7 kg)   BMI 29.40 kg/m    Well nourished, well developed female in no acute distress.  She is wearing a facemask since she is been educated about peritoneal dialysis.  She is breathing slowly and comfortably.  She can speak without interrupting her sentences.  Otherwise limited physical exam due to the conditions of the meeting and body habitus.   ASSESSMENT & PLAN:    1. CHF: Denies dyspnea.  Seems to be "at dry weight".  On vasodilators and beta-blockers. 2. CMP: The cause of her cardiomyopathy remains uncertain and she has numerous risk factors for coronary artery disease but she has never had angina.  Her nuclear stress test in July 2018 did not show evidence of ischemia. 3. HTN: Blood control is fair, albeit not perfect.  No changes made to medications. 4. ESRD: Heart failure symptoms have resolved with dialysis.  I am not sure it makes much sense for her to continue taking furosemide.  Defer to Dr. Joelyn Oms. 5. Anemia: Due to mixed iron deficiency and erythropoietin deficiency, both of which are being addressed.  Recheck CBC and TSH due to her fatigue. 6. DM: Excellent control.  Requiring very little in the way of treatment. 7. Long QT: She does  not have a history of ventricular arrhythmia or family history of sudden death, but it is still important to avoid medications with QT prolonging effects.  Unable to check ECG today.  COVID-19 Education:  The signs and symptoms of COVID-19 were discussed with the patient and how to seek care for testing (follow up with PCP or arrange E-visit).  The importance of social distancing was discussed today.  Time:   Today, I have spent 22 minutes with the patient with telehealth technology discussing the above problems.     Medication Adjustments/Labs and Tests Ordered: Current medicines are reviewed at length with the patient today.  Concerns regarding medicines are outlined above.  Tests Ordered: No orders of the defined types were placed in this encounter.  Medication Changes: No orders of the defined types were placed in this encounter.   Patient Instructions  Medication Instructions:  Your physician recommends that you continue on your current medications as directed. Please refer to the Current Medication list given to you today.  If you need a refill on your cardiac medications before your next appointment, please call your pharmacy.   Lab work: Your provider would like for you to return to have a TSH and CBC.  If you have labs (blood work) drawn today and your tests are completely normal, you will receive your results only by: [place] (if you have MyChart) OR A paper copy in the mail If you have any lab test that is abnormal or we need to change your treatment, we will call you to review the results.  Testing/Procedures: None ordered  Follow-Up: At [facility], you and your health needs are our priority.  As part of our continuing mission to provide you with exceptional heart care, we have created designated Provider Care Teams.  These Care Teams include your primary Cardiologist (physician) and Advanced Practice Providers (APPs -  Physician Assistants and Nurse  Practitioners) who all work together to provide you with the care you need, when you need it. You will need a follow up appointment in 12 months.  Please call our office 2 months in advance to schedule this appointment.  You may see Sanda Klein, MD or one of the following Advanced Practice Providers on your designated Care Team: Almyra Deforest, PA-C Fabian Sharp, PA-C          Signed, Sanda Klein, MD  05/23/2019 11:31 AM    [place]

## 2019-05-23 NOTE — Patient Instructions (Signed)
Medication Instructions:  Your physician recommends that you continue on your current medications as directed. Please refer to the Current Medication list given to you today.  If you need a refill on your cardiac medications before your next appointment, please call your pharmacy.   Lab work: Your provider would like for you to return to have a TSH and CBC.  If you have labs (blood work) drawn today and your tests are completely normal, you will receive your results only by: [place] (if you have MyChart) OR A paper copy in the mail If you have any lab test that is abnormal or we need to change your treatment, we will call you to review the results.  Testing/Procedures: None ordered  Follow-Up: At [facility], you and your health needs are our priority.  As part of our continuing mission to provide you with exceptional heart care, we have created designated Provider Care Teams.  These Care Teams include your primary Cardiologist (physician) and Advanced Practice Providers (APPs -  Physician Assistants and Nurse Practitioners) who all work together to provide you with the care you need, when you need it. You will need a follow up appointment in 12 months.  Please call our office 2 months in advance to schedule this appointment.  You may see Sanda Klein, MD or one of the following Advanced Practice Providers on your designated Care Team: Almyra Deforest, PA-C Fabian Sharp, [place]

## 2019-05-24 ENCOUNTER — Emergency Department (HOSPITAL_COMMUNITY)
Admission: EM | Admit: 2019-05-24 | Discharge: 2019-05-25 | Disposition: A | Discharge status: Home / Self Care | Payer: Medicare Other | Attending: Emergency Medicine | Admitting: Emergency Medicine

## 2019-05-24 ENCOUNTER — Encounter (HOSPITAL_COMMUNITY): Payer: Self-pay

## 2019-05-24 DIAGNOSIS — I5020 Unspecified systolic (congestive) heart failure: Secondary | ICD-10-CM | POA: Diagnosis not present

## 2019-05-24 DIAGNOSIS — Z7982 Long term (current) use of aspirin: Secondary | ICD-10-CM | POA: Insufficient documentation

## 2019-05-24 DIAGNOSIS — Z992 Dependence on renal dialysis: Secondary | ICD-10-CM | POA: Diagnosis not present

## 2019-05-24 DIAGNOSIS — E1122 Type 2 diabetes mellitus with diabetic chronic kidney disease: Secondary | ICD-10-CM | POA: Insufficient documentation

## 2019-05-24 DIAGNOSIS — K5792 Diverticulitis of intestine, part unspecified, without perforation or abscess without bleeding: Secondary | ICD-10-CM

## 2019-05-24 DIAGNOSIS — Z794 Long term (current) use of insulin: Secondary | ICD-10-CM | POA: Insufficient documentation

## 2019-05-24 DIAGNOSIS — I132 Hypertensive heart and chronic kidney disease with heart failure and with stage 5 chronic kidney disease, or end stage renal disease: Secondary | ICD-10-CM | POA: Insufficient documentation

## 2019-05-24 DIAGNOSIS — N186 End stage renal disease: Secondary | ICD-10-CM | POA: Diagnosis not present

## 2019-05-24 DIAGNOSIS — R1084 Generalized abdominal pain: Secondary | ICD-10-CM | POA: Diagnosis present

## 2019-05-24 DIAGNOSIS — K5732 Diverticulitis of large intestine without perforation or abscess without bleeding: Secondary | ICD-10-CM | POA: Diagnosis not present

## 2019-05-24 LAB — COMPREHENSIVE METABOLIC PANEL
ALT: 11 U/L (ref 0–44)
AST: 12 U/L — ABNORMAL LOW (ref 15–41)
Albumin: 2.5 g/dL — ABNORMAL LOW (ref 3.5–5.0)
Alkaline Phosphatase: 55 U/L (ref 38–126)
Anion gap: 12 (ref 5–15)
BUN: 36 mg/dL — ABNORMAL HIGH (ref 6–20)
CO2: 25 mmol/L (ref 22–32)
Calcium: 8.4 mg/dL — ABNORMAL LOW (ref 8.9–10.3)
Chloride: 98 mmol/L (ref 98–111)
Creatinine, Ser: 6.71 mg/dL — ABNORMAL HIGH (ref 0.44–1.00)
GFR calc Af Amer: 7 mL/min — ABNORMAL LOW (ref >60)
GFR calc non Af Amer: 6 mL/min — ABNORMAL LOW (ref >60)
Glucose, Bld: 167 mg/dL — ABNORMAL HIGH (ref 70–99)
Potassium: 3 mmol/L — ABNORMAL LOW (ref 3.5–5.1)
Sodium: 135 mmol/L (ref 135–145)
Total Bilirubin: 0.2 mg/dL — ABNORMAL LOW (ref 0.3–1.2)
Total Protein: 5.8 g/dL — ABNORMAL LOW (ref 6.5–8.1)

## 2019-05-24 LAB — LIPASE, BLOOD: Lipase: 36 U/L (ref 11–51)

## 2019-05-24 LAB — CBC
HCT: 26.2 % — ABNORMAL LOW (ref 36.0–46.0)
Hemoglobin: 8.6 g/dL — ABNORMAL LOW (ref 12.0–15.0)
MCH: 31.2 pg (ref 26.0–34.0)
MCHC: 32.8 g/dL (ref 30.0–36.0)
MCV: 94.9 fL (ref 80.0–100.0)
Platelets: 170 10*3/uL (ref 150–400)
RBC: 2.76 MIL/uL — ABNORMAL LOW (ref 3.87–5.11)
RDW: 15.6 % — ABNORMAL HIGH (ref 11.5–15.5)
WBC: 8.2 10*3/uL (ref 4.0–10.5)
nRBC: 0 % (ref 0.0–0.2)

## 2019-05-24 LAB — TROPONIN I (HIGH SENSITIVITY): Troponin I (High Sensitivity): 27 ng/L — ABNORMAL HIGH (ref <18)

## 2019-05-24 MED ORDER — SODIUM CHLORIDE 0.9% FLUSH
3.0000 mL | Freq: Once | INTRAVENOUS | Status: AC
  Administered 2019-05-25: 3 mL via INTRAVENOUS

## 2019-05-24 NOTE — ED Triage Notes (Signed)
Pt states that she has been having generalized abd pain for the past 2 weeks, worse over the past two days, it is a cramping sensation, denies n/v/d. States she was constipated, took miralax with some relief

## 2019-05-25 ENCOUNTER — Emergency Department (HOSPITAL_COMMUNITY): Payer: Medicare Other

## 2019-05-25 ENCOUNTER — Telehealth: Payer: Self-pay | Admitting: *Deleted

## 2019-05-25 DIAGNOSIS — K5732 Diverticulitis of large intestine without perforation or abscess without bleeding: Secondary | ICD-10-CM | POA: Diagnosis not present

## 2019-05-25 LAB — TROPONIN I (HIGH SENSITIVITY): Troponin I (High Sensitivity): 29 ng/L — ABNORMAL HIGH (ref <18)

## 2019-05-25 MED ORDER — ACETAMINOPHEN 325 MG PO TABS: 650.0000 mg | ORAL_TABLET | Freq: Four times a day (QID) | ORAL | 0 refills | Status: AC | PRN

## 2019-05-25 MED ORDER — CIPROFLOXACIN HCL 500 MG PO TABS
500.0000 mg | ORAL_TABLET | Freq: Once | ORAL | Status: AC
  Administered 2019-05-25: 500 mg via ORAL
  Filled 2019-05-25: qty 1

## 2019-05-25 MED ORDER — FENTANYL CITRATE (PF) 100 MCG/2ML IJ SOLN
50.0000 ug | Freq: Once | INTRAMUSCULAR | Status: AC
  Administered 2019-05-25: 50 ug via INTRAVENOUS
  Filled 2019-05-25: qty 2

## 2019-05-25 MED ORDER — OXYCODONE-ACETAMINOPHEN 5-325 MG PO TABS: 1.0000 | ORAL_TABLET | Freq: Every day | ORAL | 0 refills | Status: AC | PRN

## 2019-05-25 MED ORDER — METRONIDAZOLE 500 MG PO TABS
500.0000 mg | ORAL_TABLET | Freq: Once | ORAL | Status: AC
  Administered 2019-05-25: 500 mg via ORAL
  Filled 2019-05-25: qty 1

## 2019-05-25 MED ORDER — METRONIDAZOLE 500 MG PO TABS: 500.0000 mg | ORAL_TABLET | Freq: Two times a day (BID) | ORAL | 0 refills | Status: DC

## 2019-05-25 MED ORDER — CIPROFLOXACIN HCL 500 MG PO TABS: 500.0000 mg | ORAL_TABLET | Freq: Every day | ORAL | 0 refills | Status: DC

## 2019-05-25 MED FILL — metroNIDAZOLE 500 MG TABS: 500 | 7 days supply | Qty: 14 | Fill #0

## 2019-05-25 MED FILL — CIPROFLOXACIN HCL 500 MG TA: 500 | 7 days supply | Qty: 7 | Fill #0

## 2019-05-25 NOTE — Telephone Encounter (Signed)
TOC CM received call from [facility] and they do not fill narcotics. Message sent to ED MD to send to another pharmacy listed on her profile. [facility]. Will follow up with pt once Rx has been sent to another pharmacy. Jonnie Finner RN CCM Case Mgmt phone [phone]

## 2019-05-25 NOTE — ED Provider Notes (Signed)
[facility] Provider Note   CSN: [id number] Arrival date & time: 05/24/19  1942    History   Chief Complaint Chief Complaint  Patient presents with   Abdominal Pain    HPI Patricia [name] is a 63 y.o. female.     HPI  63 year old female with history of CHF, diabetes, ESRD on peritoneal dialysis comes in a chief complaint of abdominal pain. Patient has had some generalized abdominal discomfort for the last 2 weeks.  Over the last 2 days her pain is gotten worse.  She is also having poor appetite and gas-like feeling.  Review of system is also positive for constipation for which patient has taken MiraLAX.  She is having cramping type pain that is mostly in the upper quadrants of the abdomen.  Pain is nonradiating.  Review of system is negative for any fevers, chills.  Past Medical History:  Diagnosis Date   Allergy    Anemia    Anxiety    Blood transfusion without reported diagnosis    Cardiomyopathy ([place]) 11/2016   no ischemic eval due to CKD   CHF (congestive heart failure) (HCC)    CKD (chronic kidney disease), stage IV (HCC)    Depression    Diabetes mellitus without complication ([place])    type 2   Dyspnea    Hypertension    Obesity     Patient Active Problem List   Diagnosis Date Noted   End stage renal disease on dialysis ([place]) 01/20/2019   CHF (congestive heart failure) ([place]) 12/06/2018   Fluid overload 12/05/2018   Type II diabetes mellitus with renal manifestations ([place]) 12/05/2018   Hypoglycemia 12/05/2018   Acute on chronic combined systolic and diastolic CHF (congestive heart failure) ([place]) 06/14/2018   CKD (chronic kidney disease) stage 5, GFR less than 15 ml/min (HCC) 06/14/2018   Anemia due to stage 5 chronic kidney disease, not on chronic dialysis ([place]) 66/03/3015   Systolic CHF ([place]) 10/20/3233   Diabetes mellitus type 2 with complications ([place]) 57/32/2025   Transaminasemia 11/29/2016    Cardiomyopathy ([place]) 11/12/2016   Lipoma 10/21/2016   CKD (chronic kidney disease) stage 4, GFR 15-29 ml/min (HCC) 08/10/2016   Anxiety and depression 12/12/2015   Diabetic nephropathy associated with type 2 diabetes mellitus ([place]) 11/15/2014   Moderate nonproliferative diabetic retinopathy(362.05) 04/17/2014   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 Excell Seltzer 03-22-18   LIPOMA EXCISION N/A 03/22/2018   Procedure: EXCISION OF BACK LIPOMA;  Surgeon: Excell Seltzer, MD;  Location: [facility];  Service: General;  Laterality: N/A;   REDUCTION MAMMAPLASTY Bilateral      OB History    Gravida  2   Para  2   Term  2   Preterm      AB      Living  2     SAB      TAB      Ectopic      Multiple      Live Births               Home Medications    Prior to Admission medications   Medication Sig Start Date  End Date Taking? Authorizing Provider  ACCU-CHEK FASTCLIX LANCETS MISC Uad tid 10/21/18  Yes Ladell Pier, MD  acetaminophen (TYLENOL) 325 MG tablet Take 2 tablets (650 mg total) by mouth every 4 (four) hours as needed for headache or mild pain. 12/07/18  Yes Elgergawy, Silver Huguenin, MD  albuterol (PROVENTIL HFA;VENTOLIN HFA) 108 (90 Base) MCG/ACT inhaler Inhale 1-2 puffs into the lungs every 6 (six) hours as needed for wheezing or shortness of breath. 10/11/18  Yes Marney Setting, NP  aspirin EC 81 MG tablet Take 1 tablet (81 mg total) by mouth daily. 01/04/17  Yes Langeland, Dawn T, MD  atorvastatin (LIPITOR) 20 MG tablet Take 1/2 (one-half) tablet by mouth once daily 04/24/19  Yes Ladell Pier, MD  Bee Pollen 550 MG CAPS Take 550 mg by mouth daily as needed (to boost the immune  system).    Yes [provider]  Blood Glucose Monitoring Suppl (ACCU-CHEK GUIDE) w/Device KIT 1 each by Does not apply route 3 (three) times daily. Use tid as directed 10/21/18  Yes Ladell Pier, MD  calcium carbonate (TUMS - DOSED IN MG ELEMENTAL CALCIUM) 500 MG chewable tablet Chew 1 tablet by mouth 3 (three) times daily as needed for indigestion or heartburn.    Yes [provider]  carvedilol (COREG) 25 MG tablet TAKE 1 TABLET BY MOUTH TWICE DAILY WITH A MEAL Patient taking differently: Take 25 mg by mouth 2 (two) times daily with a meal.  05/04/19  Yes Croitoru, Mihai, MD  escitalopram (LEXAPRO) 20 MG tablet TAKE 1 TABLET BY MOUTH AT BEDTIME. Patient taking differently: Take 20 mg by mouth at bedtime.  04/25/19  Yes Ladell Pier, MD  fluticasone (FLONASE) 50 MCG/ACT nasal spray Place 1 spray into both nostrils daily. Patient taking differently: Place 1 spray into both nostrils daily as needed for allergies.  07/02/18  Yes Jacqualine Mau, NP  furosemide (LASIX) 80 MG tablet Take 1 tablet (80 mg total) by mouth 2 (two) times daily. 12/07/18  Yes Elgergawy, Silver Huguenin, MD  gabapentin (NEURONTIN) 100 MG capsule TAKE 1 CAPSULE (100 MG TOTAL) BY MOUTH AT BEDTIME. 12/05/18  Yes Ladell Pier, MD  Garlic 102 MG TABS Take 100 mg by mouth daily.    Yes [provider]  glucose 4 GM chewable tablet Chew 1 tablet by mouth as needed for low blood sugar.   Yes [provider]  glucose blood (ACCU-CHEK GUIDE) test strip Use as instructed tid 10/21/18  Yes Ladell Pier, MD  hydrALAZINE (APRESOLINE) 50 MG tablet Take 1 tablet (50 mg total) by mouth 2 (two) times daily. 09/16/18  Yes Ladell Pier, MD  insulin aspart (NOVOLOG) 100 UNIT/ML injection 3 units before meals. Patient taking differently: Inject 3 Units into the skin daily as needed for high blood sugar.  04/06/17  Yes Ladell Pier, MD  Insulin Glargine (LANTUS SOLOSTAR) 100 UNIT/ML  Solostar Pen Inject 4 Units into the skin at bedtime. Patient taking differently: Inject 24 Units into the skin daily as needed (sugar levels).  12/07/18  Yes Elgergawy, Silver Huguenin, MD  Insulin Pen Needle (TRUEPLUS PEN NEEDLES) 32G X 4 MM MISC Use as directed to inject insulin. 03/25/18  Yes Ladell Pier, MD  Insulin Pen Needle 31G X 5 MM MISC Use as directed 03/28/18  Yes Ladell Pier, MD  isosorbide mononitrate (IMDUR) 30 MG 24 hr tablet TAKE 1/2 TABLET BY MOUTH DAILY. Patient taking differently: Take 15 mg  by mouth daily.  10/20/18  Yes Lendon Colonel, NP  RENAGEL 800 MG tablet Take 2,400-3,200 mg by mouth 3 (three) times daily with meals. Take 2400 mg with small meals and 3200 mg with large meals 09/22/18  Yes [provider]    Family History Family History  Problem Relation Age of Onset   Diabetes Brother    Stomach cancer Brother    Kidney disease Brother    Heart Problems Maternal Grandmother        pacemaker   Heart disease Maternal Grandfather    Rectal cancer Neg Hx    Esophageal cancer Neg Hx    Liver cancer Neg Hx    Colon cancer Neg Hx     Social History Social History   Tobacco Use   Smoking status: Never Smoker   Smokeless tobacco: Never Used  Substance Use Topics   Alcohol use: No   Drug use: No     Allergies   Codone [hydrocodone], Norvasc [amlodipine besylate], and Sulfa antibiotics   Review of Systems Review of Systems  Constitutional: Positive for activity change.  Respiratory: Negative for shortness of breath.   Cardiovascular: Negative for chest pain.  Gastrointestinal: Positive for abdominal pain.  Allergic/Immunologic: Negative for immunocompromised state.  Hematological: Does not bruise/bleed easily.  All other systems reviewed and are negative.    Physical Exam Updated Vital Signs BP (!) 189/72    Pulse 78    Temp 98.7 F (37.1 C) (Oral)    Resp (!) 24    SpO2 99%   Physical Exam Vitals signs and  nursing note reviewed.  Constitutional:      Appearance: She is well-developed.  HENT:     Head: Normocephalic and atraumatic.  Neck:     Musculoskeletal: Normal range of motion and neck supple.  Cardiovascular:     Rate and Rhythm: Normal rate.  Pulmonary:     Effort: Pulmonary effort is normal.  Abdominal:     General: Bowel sounds are normal.     Tenderness: There is generalized abdominal tenderness. There is guarding.     Comments: Patient has generalized abdominal tenderness that is worse in the left lower quadrant.  Skin:    General: Skin is warm and dry.  Neurological:     Mental Status: She is alert and oriented to person, place, and time.      ED Treatments / Results  Labs (all labs ordered are listed, but only abnormal results are displayed) Labs Reviewed  COMPREHENSIVE METABOLIC PANEL - Abnormal; Notable for the following components:      Result Value   Potassium 3.0 (*)    Glucose, Bld 167 (*)    BUN 36 (*)    Creatinine, Ser 6.71 (*)    Calcium 8.4 (*)    Total Protein 5.8 (*)    Albumin 2.5 (*)    AST 12 (*)    Total Bilirubin 0.2 (*)    GFR calc non Af Amer 6 (*)    GFR calc Af Amer 7 (*)    All other components within normal limits  CBC - Abnormal; Notable for the following components:   RBC 2.76 (*)    Hemoglobin 8.6 (*)    HCT 26.2 (*)    RDW 15.6 (*)    All other components within normal limits  TROPONIN I (HIGH SENSITIVITY) - Abnormal; Notable for the following components:   Troponin I (High Sensitivity) 27 (*)    All other components within  normal limits  TROPONIN I (HIGH SENSITIVITY) - Abnormal; Notable for the following components:   Troponin I (High Sensitivity) 29 (*)    All other components within normal limits  LIPASE, BLOOD  URINALYSIS, ROUTINE W REFLEX MICROSCOPIC    EKG EKG Interpretation  Date/Time:  Wednesday May 24 2019 20:07:36 EDT Ventricular Rate:  82 PR Interval:  142 QRS Duration: 92 QT Interval:  414 QTC  Calculation: 483 R Axis:   32 Text Interpretation:  Normal sinus rhythm Nonspecific T wave abnormality Prolonged QT Abnormal ECG No acute changes No significant change since last tracing Confirmed by Varney Biles [phone]) on 05/25/2019 1:55:58 AM   Radiology Ct Abdomen Pelvis Wo Contrast  Result Date: 05/25/2019 CLINICAL DATA:  63 year old female with generalized abdominal pain x2 weeks. History of diabetes and chronic kidney disease. EXAM: CT ABDOMEN AND PELVIS WITHOUT CONTRAST TECHNIQUE: Multidetector CT imaging of the abdomen and pelvis was performed following the standard protocol without IV contrast. COMPARISON:  CT of the abdomen pelvis dated 11/01/2016 FINDINGS: Evaluation of this exam is limited in the absence of intravenous contrast. Lower chest: Minimal left lung base linear atelectasis or scarring. The visualized lung bases are otherwise clear. There is hypoattenuation of the cardiac blood pool suggestive of a degree of anemia. Clinical correlation is recommended. No intra-abdominal free air or free fluid. Hepatobiliary: No focal liver abnormality is seen. No gallstones, gallbladder wall thickening, or biliary dilatation. Pancreas: Unremarkable. No pancreatic ductal dilatation or surrounding inflammatory changes. Spleen: Normal in size without focal abnormality. Adrenals/Urinary Tract: The adrenal glands are unremarkable. There is no hydronephrosis or nephrolithiasis on either side. Minimal bilateral perinephric stranding, nonspecific. Correlation with urinalysis recommended to exclude UTI. The visualized ureters appear unremarkable. The urinary bladder is partially distended and grossly unremarkable. Stomach/Bowel: There is sigmoid diverticulosis with inflammatory changes of the distal descending/sigmoid junction diverticula consistent with acute diverticulitis. No drainable fluid collection/abscess or perforation. There is a small hiatal hernia. There is no bowel obstruction. The appendix is  normal. Vascular/Lymphatic: Moderate aortoiliac atherosclerotic disease. The IVC is grossly unremarkable. No portal venous gas. There is no adenopathy. Reproductive: Hysterectomy. No pelvic mass. Other: A peritoneal dialysis catheter with tip in the pelvis noted. There is a small amount of fluid in the subcutaneous soft tissues of the right anterior pelvic wall around the catheter. There is a small umbilical hernia with a small amount of fluid within the herniated fat. Correlation with clinical exam is recommended to exclude incarceration. No herniated bowel. Focal area of stranding in the upper omentum (series 3, image 29) likely represents an area of omental infarct. No fluid collection. Musculoskeletal: Mild degenerative changes of the spine. Grade 1 L4-L5 anterolisthesis. No acute osseous pathology. IMPRESSION: 1. Sigmoid diverticulitis. No abscess or perforation. 2. Small umbilical hernia with a small amount of fluid within the herniated fat. Correlation with clinical exam is recommended to exclude incarceration. No herniated bowel. 3. Aortic Atherosclerosis (ICD10-I70.0). Electronically Signed   By: Anner Crete M.D.   On: 05/25/2019 03:11    Procedures Procedures (including critical care time)  Medications Ordered in ED Medications  ciprofloxacin (CIPRO) tablet 500 mg (has no administration in time range)  metroNIDAZOLE (FLAGYL) tablet 500 mg (has no administration in time range)  sodium chloride flush (NS) 0.9 % injection 3 mL (3 mLs Intravenous Given 05/25/19 0108)  fentaNYL (SUBLIMAZE) injection 50 mcg (50 mcg Intravenous Given 05/25/19 0315)     Initial Impression / Assessment and Plan / ED Course  I have reviewed  the triage vital signs and the nursing notes.  Pertinent labs & imaging results that were available during my care of the patient were reviewed by me and considered in my medical decision making (see chart for details).  Clinical Course as of May 25 331  Thu May 25, 2019    0332 Results in the ER show that patient has diverticulitis. We will cancel the peritoneal fluid analysis.  Shared decision was made with the patient and her daughter, they agree that if there is a diagnosis of diverticulitis then they do not feel compelled to get SBP rule out.  We will give her Cipro and Flagyl. Strict ER return precautions have been discussed, and patient is agreeing with the plan and is comfortable with the workup done and the recommendations from the ER.    [AN]    Clinical Course User Index [AN] Varney Biles, MD      63 year old female with history of ESRD on peritoneal dialysis, CHF, diabetes comes in a chief complaint of abdominal pain.  She is having generalized abdominal pain, mostly in the upper quadrants, however on exam her tenderness is worst in the left lower quadrant where she actually had some guarding.  It is unclear why she is having pain.  The pain is gotten worse over the last 2 days.  With the pain being generalized, differential diagnosis includes peritonitis.  However with focal tenderness in the left lower quadrant, diverticulitis and complication such as perforation of the diverticulitis are also in the differential diagnosis.  CT scan ordered.  We have also called hemodialysis nurse to see if they can drain the peritoneal fluid.  In the interim, patient's daughter works on the dialysis floor and reports that she has not seen any signs of infection on the peritoneal fluid, and that the testing last week was normal.  Pretest probability for SBP is low, however without a diagnosis for this generalized abdominal discomfort, we think it is prudent to rule out SBP.   Final Clinical Impressions(s) / ED Diagnoses   Final diagnoses:  Acute diverticulitis    ED Discharge Orders    None       Varney Biles, MD 05/25/19 [phone]

## 2019-05-25 NOTE — Discharge Instructions (Signed)
Results here show that you have a condition called diverticulitis. We recommend that you take the antibiotics as prescribed. Take the stronger pain medicine only once a day, and only if needed.  Otherwise take Tylenol 650 mg every 6 hours.  Please return to the ER if your symptoms worsen; you have increased pain, fevers, chills, inability to keep any medications down, confusion. Otherwise see the outpatient doctor as requested.

## 2019-06-06 LAB — CBC
Hematocrit: 29 % — ABNORMAL LOW (ref 34.0–46.6)
Hemoglobin: 9.6 g/dL — ABNORMAL LOW (ref 11.1–15.9)
MCH: 31.2 pg (ref 26.6–33.0)
MCHC: 33.1 g/dL (ref 31.5–35.7)
MCV: 94 fL (ref 79–97)
Platelets: 235 10*3/uL (ref 150–450)
RBC: 3.08 x10E6/uL — ABNORMAL LOW (ref 3.77–5.28)
RDW: 15.1 % (ref 11.7–15.4)
WBC: 7.7 10*3/uL (ref 3.4–10.8)

## 2019-06-06 LAB — TSH: TSH: 0.493 u[IU]/mL (ref 0.450–4.500)

## 2019-06-10 ENCOUNTER — Other Ambulatory Visit: Payer: Self-pay | Admitting: Adult Health

## 2019-06-10 ENCOUNTER — Other Ambulatory Visit: Payer: Self-pay | Admitting: Internal Medicine

## 2019-06-10 DIAGNOSIS — E785 Hyperlipidemia, unspecified: Secondary | ICD-10-CM

## 2019-06-16 ENCOUNTER — Inpatient Hospital Stay (HOSPITAL_COMMUNITY): Payer: Medicare Other | Admitting: Anesthesiology

## 2019-06-16 ENCOUNTER — Ambulatory Visit (INDEPENDENT_AMBULATORY_CARE_PROVIDER_SITE_OTHER): Payer: Medicare Other | Admitting: Physician Assistant

## 2019-06-16 ENCOUNTER — Telehealth: Payer: Self-pay

## 2019-06-16 ENCOUNTER — Other Ambulatory Visit: Payer: Self-pay

## 2019-06-16 ENCOUNTER — Encounter: Payer: Self-pay | Admitting: Physician Assistant

## 2019-06-16 ENCOUNTER — Inpatient Hospital Stay (HOSPITAL_COMMUNITY)
Admission: EM | Admit: 2019-06-16 | Discharge: 2019-06-20 | DRG: 353 | Disposition: A | Discharge status: Home / Self Care | Payer: Medicare Other | Attending: Internal Medicine | Admitting: Internal Medicine

## 2019-06-16 ENCOUNTER — Encounter (HOSPITAL_COMMUNITY)
Admission: EM | Disposition: A | Discharge status: Home / Self Care | Payer: Self-pay | Source: Home / Self Care | Attending: Internal Medicine

## 2019-06-16 ENCOUNTER — Ambulatory Visit (HOSPITAL_COMMUNITY)
Admission: RE | Admit: 2019-06-16 | Discharge: 2019-06-16 | Disposition: A | Discharge status: Home / Self Care | Payer: Medicare Other | Source: Ambulatory Visit | Attending: Physician Assistant | Admitting: Physician Assistant

## 2019-06-16 VITALS — BP 124/62 | HR 88 | Temp 97.6°F | Ht 63.0 in | Wt 158.0 lb

## 2019-06-16 DIAGNOSIS — N186 End stage renal disease: Secondary | ICD-10-CM | POA: Diagnosis present

## 2019-06-16 DIAGNOSIS — R109 Unspecified abdominal pain: Secondary | ICD-10-CM | POA: Insufficient documentation

## 2019-06-16 DIAGNOSIS — I429 Cardiomyopathy, unspecified: Secondary | ICD-10-CM | POA: Diagnosis present

## 2019-06-16 DIAGNOSIS — Z20828 Contact with and (suspected) exposure to other viral communicable diseases: Secondary | ICD-10-CM | POA: Diagnosis present

## 2019-06-16 DIAGNOSIS — R112 Nausea with vomiting, unspecified: Secondary | ICD-10-CM

## 2019-06-16 DIAGNOSIS — Z7982 Long term (current) use of aspirin: Secondary | ICD-10-CM

## 2019-06-16 DIAGNOSIS — Z992 Dependence on renal dialysis: Secondary | ICD-10-CM | POA: Diagnosis not present

## 2019-06-16 DIAGNOSIS — K429 Umbilical hernia without obstruction or gangrene: Secondary | ICD-10-CM | POA: Diagnosis present

## 2019-06-16 DIAGNOSIS — D649 Anemia, unspecified: Secondary | ICD-10-CM | POA: Diagnosis present

## 2019-06-16 DIAGNOSIS — I132 Hypertensive heart and chronic kidney disease with heart failure and with stage 5 chronic kidney disease, or end stage renal disease: Secondary | ICD-10-CM | POA: Diagnosis present

## 2019-06-16 DIAGNOSIS — E1122 Type 2 diabetes mellitus with diabetic chronic kidney disease: Secondary | ICD-10-CM | POA: Diagnosis present

## 2019-06-16 DIAGNOSIS — I160 Hypertensive urgency: Secondary | ICD-10-CM | POA: Diagnosis present

## 2019-06-16 DIAGNOSIS — Z90710 Acquired absence of both cervix and uterus: Secondary | ICD-10-CM | POA: Diagnosis not present

## 2019-06-16 DIAGNOSIS — E876 Hypokalemia: Secondary | ICD-10-CM | POA: Diagnosis present

## 2019-06-16 DIAGNOSIS — Z79899 Other long term (current) drug therapy: Secondary | ICD-10-CM

## 2019-06-16 DIAGNOSIS — Z794 Long term (current) use of insulin: Secondary | ICD-10-CM

## 2019-06-16 DIAGNOSIS — R339 Retention of urine, unspecified: Secondary | ICD-10-CM | POA: Diagnosis present

## 2019-06-16 DIAGNOSIS — K56609 Unspecified intestinal obstruction, unspecified as to partial versus complete obstruction: Secondary | ICD-10-CM

## 2019-06-16 DIAGNOSIS — K420 Umbilical hernia with obstruction, without gangrene: Secondary | ICD-10-CM | POA: Diagnosis present

## 2019-06-16 DIAGNOSIS — I5042 Chronic combined systolic (congestive) and diastolic (congestive) heart failure: Secondary | ICD-10-CM | POA: Diagnosis present

## 2019-06-16 DIAGNOSIS — I509 Heart failure, unspecified: Secondary | ICD-10-CM

## 2019-06-16 DIAGNOSIS — R1013 Epigastric pain: Secondary | ICD-10-CM | POA: Diagnosis present

## 2019-06-16 HISTORY — PX: LAPAROSCOPY: SHX197

## 2019-06-16 LAB — CBC
HCT: 28.4 % — ABNORMAL LOW (ref 36.0–46.0)
Hemoglobin: 9.3 g/dL — ABNORMAL LOW (ref 12.0–15.0)
MCH: 31.4 pg (ref 26.0–34.0)
MCHC: 32.7 g/dL (ref 30.0–36.0)
MCV: 95.9 fL (ref 80.0–100.0)
Platelets: 231 10*3/uL (ref 150–400)
RBC: 2.96 MIL/uL — ABNORMAL LOW (ref 3.87–5.11)
RDW: 14.8 % (ref 11.5–15.5)
WBC: 8.3 10*3/uL (ref 4.0–10.5)
nRBC: 0 % (ref 0.0–0.2)

## 2019-06-16 LAB — TYPE AND SCREEN
ABO/RH(D): B POS
Antibody Screen: NEGATIVE

## 2019-06-16 LAB — COMPREHENSIVE METABOLIC PANEL
ALT: 15 U/L (ref 0–44)
AST: 15 U/L (ref 15–41)
Albumin: 2.9 g/dL — ABNORMAL LOW (ref 3.5–5.0)
Alkaline Phosphatase: 58 U/L (ref 38–126)
Anion gap: 15 (ref 5–15)
BUN: 27 mg/dL — ABNORMAL HIGH (ref 6–20)
CO2: 23 mmol/L (ref 22–32)
Calcium: 8.5 mg/dL — ABNORMAL LOW (ref 8.9–10.3)
Chloride: 100 mmol/L (ref 98–111)
Creatinine, Ser: 7.36 mg/dL — ABNORMAL HIGH (ref 0.44–1.00)
GFR calc Af Amer: 6 mL/min — ABNORMAL LOW (ref >60)
GFR calc non Af Amer: 5 mL/min — ABNORMAL LOW (ref >60)
Glucose, Bld: 123 mg/dL — ABNORMAL HIGH (ref 70–99)
Potassium: 3.2 mmol/L — ABNORMAL LOW (ref 3.5–5.1)
Sodium: 138 mmol/L (ref 135–145)
Total Bilirubin: 0.7 mg/dL (ref 0.3–1.2)
Total Protein: 6.4 g/dL — ABNORMAL LOW (ref 6.5–8.1)

## 2019-06-16 LAB — SARS CORONAVIRUS 2 BY RT PCR (HOSPITAL ORDER, PERFORMED IN ~~LOC~~ HOSPITAL LAB): SARS Coronavirus 2: NEGATIVE

## 2019-06-16 LAB — HEMOGLOBIN A1C
Hgb A1c MFr Bld: 7 % — ABNORMAL HIGH (ref 4.8–5.6)
Mean Plasma Glucose: 154.2 mg/dL

## 2019-06-16 LAB — LACTIC ACID, PLASMA: Lactic Acid, Venous: 0.8 mmol/L (ref 0.5–1.9)

## 2019-06-16 LAB — MAGNESIUM: Magnesium: 2 mg/dL (ref 1.7–2.4)

## 2019-06-16 LAB — GLUCOSE, CAPILLARY: Glucose-Capillary: 100 mg/dL — ABNORMAL HIGH (ref 70–99)

## 2019-06-16 LAB — LIPASE, BLOOD: Lipase: 32 U/L (ref 11–51)

## 2019-06-16 SURGERY — LAPAROSCOPY, DIAGNOSTIC
Anesthesia: General | Site: Abdomen

## 2019-06-16 MED ORDER — MIDAZOLAM HCL 2 MG/2ML IJ SOLN
INTRAMUSCULAR | Status: AC
  Filled 2019-06-16: qty 2

## 2019-06-16 MED ORDER — SUCCINYLCHOLINE CHLORIDE 200 MG/10ML IV SOSY
PREFILLED_SYRINGE | INTRAVENOUS | Status: AC
  Filled 2019-06-16: qty 10

## 2019-06-16 MED ORDER — ROCURONIUM BROMIDE 10 MG/ML (PF) SYRINGE
PREFILLED_SYRINGE | INTRAVENOUS | Status: AC
  Filled 2019-06-16: qty 10

## 2019-06-16 MED ORDER — FENTANYL CITRATE (PF) 100 MCG/2ML IJ SOLN
INTRAMUSCULAR | Status: AC
  Filled 2019-06-16: qty 2

## 2019-06-16 MED ORDER — PIPERACILLIN-TAZOBACTAM IN DEX 2-0.25 GM/50ML IV SOLN: 2.2500 g | Freq: Three times a day (TID) | INTRAVENOUS | Status: DC

## 2019-06-16 MED ORDER — LIDOCAINE 2% (20 MG/ML) 5 ML SYRINGE
INTRAMUSCULAR | Status: AC
  Filled 2019-06-16: qty 5

## 2019-06-16 MED ORDER — DEXAMETHASONE SODIUM PHOSPHATE 10 MG/ML IJ SOLN
INTRAMUSCULAR | Status: AC
  Filled 2019-06-16: qty 1

## 2019-06-16 MED ORDER — PROPOFOL 10 MG/ML IV BOLUS
INTRAVENOUS | Status: DC | PRN
  Administered 2019-06-16: 100 mg via INTRAVENOUS

## 2019-06-16 MED ORDER — SUCCINYLCHOLINE 20MG/ML (10ML) SYRINGE FOR MEDFUSION PUMP - OPTIME
INTRAMUSCULAR | Status: DC | PRN
  Administered 2019-06-16: 80 mg via INTRAVENOUS

## 2019-06-16 MED ORDER — FENTANYL CITRATE (PF) 100 MCG/2ML IJ SOLN
25.0000 ug | INTRAMUSCULAR | Status: DC | PRN
  Administered 2019-06-16: 23:00:00 25 ug via INTRAVENOUS

## 2019-06-16 MED ORDER — FENTANYL CITRATE (PF) 250 MCG/5ML IJ SOLN
INTRAMUSCULAR | Status: DC | PRN
  Administered 2019-06-16: 75 ug via INTRAVENOUS
  Administered 2019-06-16: 25 ug via INTRAVENOUS

## 2019-06-16 MED ORDER — EPHEDRINE SULFATE 50 MG/ML IJ SOLN
INTRAMUSCULAR | Status: DC | PRN
  Administered 2019-06-16: 10 mg via INTRAVENOUS

## 2019-06-16 MED ORDER — DIPHENHYDRAMINE HCL 50 MG/ML IJ SOLN
12.5000 mg | Freq: Four times a day (QID) | INTRAMUSCULAR | Status: DC | PRN
  Administered 2019-06-18 (×2): 12.5 mg via INTRAVENOUS
  Filled 2019-06-16 (×2): qty 1

## 2019-06-16 MED ORDER — ONDANSETRON 4 MG PO TBDP: ORAL_TABLET | ORAL | 2 refills | Status: DC

## 2019-06-16 MED ORDER — SODIUM CHLORIDE 0.9% FLUSH: 3.0000 mL | Freq: Once | INTRAVENOUS | Status: DC

## 2019-06-16 MED ORDER — DIPHENHYDRAMINE HCL 50 MG/ML IJ SOLN
12.5000 mg | Freq: Once | INTRAMUSCULAR | Status: AC
  Administered 2019-06-16: 12.5 mg via INTRAVENOUS
  Filled 2019-06-16: qty 1

## 2019-06-16 MED ORDER — DICYCLOMINE HCL 10 MG PO CAPS: 10.0000 mg | ORAL_CAPSULE | Freq: Four times a day (QID) | ORAL | 2 refills | Status: DC | PRN

## 2019-06-16 MED ORDER — MORPHINE SULFATE (PF) 4 MG/ML IV SOLN
6.0000 mg | Freq: Once | INTRAVENOUS | Status: AC
  Administered 2019-06-16: 6 mg via INTRAVENOUS
  Filled 2019-06-16: qty 2

## 2019-06-16 MED ORDER — HYDRALAZINE HCL 20 MG/ML IJ SOLN
10.0000 mg | INTRAMUSCULAR | Status: DC | PRN
  Administered 2019-06-17 – 2019-06-19 (×4): 10 mg via INTRAVENOUS
  Filled 2019-06-16 (×4): qty 1

## 2019-06-16 MED ORDER — FENTANYL CITRATE (PF) 250 MCG/5ML IJ SOLN
INTRAMUSCULAR | Status: AC
  Filled 2019-06-16: qty 5

## 2019-06-16 MED ORDER — BUPIVACAINE HCL (PF) 0.25 % IJ SOLN
INTRAMUSCULAR | Status: AC
  Filled 2019-06-16: qty 30

## 2019-06-16 MED ORDER — PROPOFOL 10 MG/ML IV BOLUS
INTRAVENOUS | Status: AC
  Filled 2019-06-16: qty 20

## 2019-06-16 MED ORDER — ONDANSETRON HCL 4 MG/2ML IJ SOLN: 4.0000 mg | Freq: Four times a day (QID) | INTRAMUSCULAR | Status: DC | PRN

## 2019-06-16 MED ORDER — DEXAMETHASONE SODIUM PHOSPHATE 10 MG/ML IJ SOLN
INTRAMUSCULAR | Status: DC | PRN
  Administered 2019-06-16: 10 mg via INTRAVENOUS

## 2019-06-16 MED ORDER — SODIUM CHLORIDE 0.9 % IV SOLN
2.0000 g | INTRAVENOUS | Status: AC
  Administered 2019-06-16: 22:00:00 2 g via INTRAVENOUS
  Filled 2019-06-16 (×2): qty 2

## 2019-06-16 MED ORDER — MORPHINE SULFATE (PF) 2 MG/ML IV SOLN
1.0000 mg | INTRAVENOUS | Status: DC | PRN
  Administered 2019-06-17: 1 mg via INTRAVENOUS
  Filled 2019-06-16: qty 1

## 2019-06-16 MED ORDER — 0.9 % SODIUM CHLORIDE (POUR BTL) OPTIME
TOPICAL | Status: DC | PRN
  Administered 2019-06-16: 1000 mL

## 2019-06-16 MED ORDER — INSULIN ASPART 100 UNIT/ML ~~LOC~~ SOLN
0.0000 [IU] | SUBCUTANEOUS | Status: DC
  Administered 2019-06-17: 5 [IU] via SUBCUTANEOUS
  Administered 2019-06-17: 7 [IU] via SUBCUTANEOUS
  Administered 2019-06-17: 2 [IU] via SUBCUTANEOUS
  Administered 2019-06-17: 3 [IU] via SUBCUTANEOUS

## 2019-06-16 MED ORDER — MIDAZOLAM HCL 2 MG/2ML IJ SOLN
INTRAMUSCULAR | Status: DC | PRN
  Administered 2019-06-16: 2 mg via INTRAVENOUS

## 2019-06-16 MED ORDER — LACTATED RINGERS IV SOLN
INTRAVENOUS | Status: DC | PRN
  Administered 2019-06-16: 22:00:00 via INTRAVENOUS

## 2019-06-16 MED ORDER — ACETAMINOPHEN 650 MG RE SUPP: 650.0000 mg | Freq: Four times a day (QID) | RECTAL | Status: DC | PRN

## 2019-06-16 MED ORDER — ACETAMINOPHEN 325 MG PO TABS
650.0000 mg | ORAL_TABLET | Freq: Four times a day (QID) | ORAL | Status: DC | PRN
  Administered 2019-06-20: 650 mg via ORAL
  Filled 2019-06-16: qty 2

## 2019-06-16 MED ORDER — EPHEDRINE 5 MG/ML INJ
INTRAVENOUS | Status: AC
  Filled 2019-06-16: qty 10

## 2019-06-16 MED ORDER — BUPIVACAINE HCL 0.25 % IJ SOLN
INTRAMUSCULAR | Status: DC | PRN
  Administered 2019-06-16: 5 mL

## 2019-06-16 MED ORDER — ONDANSETRON HCL 4 MG/2ML IJ SOLN
INTRAMUSCULAR | Status: DC | PRN
  Administered 2019-06-16: 4 mg via INTRAVENOUS

## 2019-06-16 MED ORDER — ROCURONIUM 10MG/ML (10ML) SYRINGE FOR MEDFUSION PUMP - OPTIME
INTRAVENOUS | Status: DC | PRN
  Administered 2019-06-16: 30 mg via INTRAVENOUS

## 2019-06-16 MED ORDER — LIDOCAINE HCL (CARDIAC) PF 100 MG/5ML IV SOSY
PREFILLED_SYRINGE | INTRAVENOUS | Status: DC | PRN
  Administered 2019-06-16: 80 mg via INTRATRACHEAL

## 2019-06-16 MED ORDER — POTASSIUM CHLORIDE 10 MEQ/100ML IV SOLN: 10.0000 meq | INTRAVENOUS | Status: DC

## 2019-06-16 MED ORDER — ONDANSETRON HCL 4 MG/2ML IJ SOLN
INTRAMUSCULAR | Status: AC
  Filled 2019-06-16: qty 2

## 2019-06-16 MED ORDER — SUGAMMADEX SODIUM 200 MG/2ML IV SOLN
INTRAVENOUS | Status: DC | PRN
  Administered 2019-06-16: 200 mg via INTRAVENOUS

## 2019-06-16 MED FILL — DICYCLOMINE 10 MG CAPSULE: 10 | 8 days supply | Qty: 30 | Fill #0

## 2019-06-16 MED FILL — ONDANSETRON ODT 4 MG TABLET: 4 | 5 days supply | Qty: 30 | Fill #0

## 2019-06-16 SURGICAL SUPPLY — 38 items
BLADE CLIPPER SURG (BLADE) IMPLANT
CANISTER SUCT 3000ML PPV (MISCELLANEOUS) IMPLANT
CHLORAPREP W/TINT 26 (MISCELLANEOUS) ×3 IMPLANT
COVER SURGICAL LIGHT HANDLE (MISCELLANEOUS) ×3 IMPLANT
COVER WAND RF STERILE (DRAPES) ×3 IMPLANT
DEFOGGER SCOPE WARMER CLEARIFY (MISCELLANEOUS) IMPLANT
DERMABOND ADHESIVE PROPEN (GAUZE/BANDAGES/DRESSINGS) ×2
DERMABOND ADVANCED (GAUZE/BANDAGES/DRESSINGS) ×2
DERMABOND ADVANCED .7 DNX12 (GAUZE/BANDAGES/DRESSINGS) ×1 IMPLANT
DERMABOND ADVANCED .7 DNX6 (GAUZE/BANDAGES/DRESSINGS) IMPLANT
DRAPE WARM FLUID 44X44 (DRAPES) ×3 IMPLANT
ELECT REM PT RETURN 9FT ADLT (ELECTROSURGICAL) ×3
ELECTRODE REM PT RTRN 9FT ADLT (ELECTROSURGICAL) ×1 IMPLANT
GLOVE BIO SURGEON STRL SZ7.5 (GLOVE) ×3 IMPLANT
GOWN STRL REUS W/ TWL LRG LVL3 (GOWN DISPOSABLE) ×2 IMPLANT
GOWN STRL REUS W/ TWL XL LVL3 (GOWN DISPOSABLE) ×1 IMPLANT
GOWN STRL REUS W/TWL LRG LVL3 (GOWN DISPOSABLE) ×4
GOWN STRL REUS W/TWL XL LVL3 (GOWN DISPOSABLE) ×2
KIT BASIN OR (CUSTOM PROCEDURE TRAY) ×3 IMPLANT
KIT TURNOVER KIT B (KITS) ×3 IMPLANT
NDL INSUFFLATION 14GA 120MM (NEEDLE) ×1 IMPLANT
NEEDLE INSUFFLATION 14GA 120MM (NEEDLE) ×3 IMPLANT
NS IRRIG 1000ML POUR BTL (IV SOLUTION) ×3 IMPLANT
PAD ARMBOARD 7.5X6 YLW CONV (MISCELLANEOUS) ×6 IMPLANT
PENCIL SMOKE EVACUATOR (MISCELLANEOUS) ×2 IMPLANT
SCISSORS LAP 5X35 DISP (ENDOMECHANICALS) IMPLANT
SET IRRIG TUBING LAPAROSCOPIC (IRRIGATION / IRRIGATOR) IMPLANT
SET TUBE SMOKE EVAC HIGH FLOW (TUBING) ×3 IMPLANT
SLEEVE ENDOPATH XCEL 5M (ENDOMECHANICALS) ×5 IMPLANT
SUT MNCRL AB 4-0 PS2 18 (SUTURE) ×3 IMPLANT
TOWEL GREEN STERILE (TOWEL DISPOSABLE) ×3 IMPLANT
TOWEL GREEN STERILE FF (TOWEL DISPOSABLE) ×3 IMPLANT
TRAY LAPAROSCOPIC MC (CUSTOM PROCEDURE TRAY) ×3 IMPLANT
TROCAR BLADELESS 5MM (ENDOMECHANICALS) ×2 IMPLANT
TROCAR XCEL 12X100 BLDLESS (ENDOMECHANICALS) IMPLANT
TROCAR XCEL BLUNT TIP 100MML (ENDOMECHANICALS) ×2 IMPLANT
TROCAR XCEL NON-BLD 11X100MML (ENDOMECHANICALS) IMPLANT
TROCAR XCEL NON-BLD 5MMX100MML (ENDOMECHANICALS) ×3 IMPLANT

## 2019-06-16 NOTE — ED Provider Notes (Addendum)
[facility] EMERGENCY DEPARTMENT Provider Note   CSN: [id number] Arrival date & time: 06/16/19  1729     History   Chief Complaint Chief Complaint  Patient presents with   Abdominal Pain    HPI Patricia [name] is a 63 y.o. female.     63 year old female with history of chronic kidney disease who takes peritoneal dialysis presents due to increasing abdominal discomfort.  Saw GI today and had outpatient CT performed which showed a strangulated hernia.  She has had nonbilious emesis but states that she vomits daily.  Denies any fever or chills.  States that she does peritoneal exchanges at night and that there has been no change in the color of the fluid.  Pain is characterized as severe and worse around her umbilicus.  No treatment use prior to arrival.     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[place]) 01/20/2019   CHF (congestive heart failure) ([place]) 12/06/2018   Fluid overload 12/05/2018   Type II diabetes mellitus with renal manifestations ([place]) 12/05/2018   Hypoglycemia 12/05/2018   Acute on chronic combined systolic and diastolic CHF (congestive heart failure) ([place]) 06/14/2018   CKD (chronic kidney disease) stage 5, GFR less than 15 ml/min (HCC) 06/14/2018   Anemia due to stage 5 chronic kidney disease, not on chronic dialysis ([place]) 32/35/5732   Systolic CHF ([place]) 20/25/4270   Diabetes mellitus type 2 with complications ([place]) 62/37/6283   Transaminasemia 11/29/2016   Cardiomyopathy ([place]) 11/12/2016     Lipoma 10/21/2016   CKD (chronic kidney disease) stage 4, GFR 15-29 ml/min (HCC) 08/10/2016   Anxiety and depression 12/12/2015   Diabetic nephropathy associated with type 2 diabetes mellitus ([place]) 11/15/2014   Moderate nonproliferative diabetic retinopathy(362.05) 04/17/2014   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 Excell Seltzer 03-22-18   LIPOMA EXCISION N/A 03/22/2018   Procedure: EXCISION OF BACK LIPOMA;  Surgeon: Excell Seltzer, MD;  Location: [facility]S;  Service: General;  Laterality: N/A;   REDUCTION MAMMAPLASTY Bilateral      OB History    Gravida  2   Para  2   Term  2   Preterm      AB      Living  2     SAB      TAB      Ectopic      Multiple      Live Births               Home Medications    Prior to Admission medications   Medication Sig Start Date End Date Taking? Authorizing  Provider  ACCU-CHEK FASTCLIX LANCETS MISC Uad tid 10/21/18   Ladell Pier, MD  acetaminophen (TYLENOL) 325 MG tablet Take 2 tablets (650 mg total) by mouth every 6 (six) hours as needed. 05/25/19   Varney Biles, MD  albuterol (PROVENTIL HFA;VENTOLIN HFA) 108 (90 Base) MCG/ACT inhaler Inhale 1-2 puffs into the lungs every 6 (six) hours as needed for wheezing or shortness of breath. 10/11/18   Marney Setting, NP  aspirin EC 81 MG tablet Take 1 tablet (81 mg total) by mouth daily. 01/04/17   Maren Reamer, MD  atorvastatin (LIPITOR) 20 MG tablet Take 1/2 (one-half) tablet by mouth once daily 06/12/19   Ladell Pier, MD  Bee Pollen 550 MG CAPS Take 550 mg by mouth daily as needed (to boost the immune system).     [provider]  Blood Glucose Monitoring Suppl (ACCU-CHEK  GUIDE) w/Device KIT 1 each by Does not apply route 3 (three) times daily. Use tid as directed 10/21/18   Ladell Pier, MD  calcium carbonate (TUMS - DOSED IN MG ELEMENTAL CALCIUM) 500 MG chewable tablet Chew 1 tablet by mouth 3 (three) times daily as needed for indigestion or heartburn.     [provider]  carvedilol (COREG) 25 MG tablet TAKE 1 TABLET BY MOUTH TWICE DAILY WITH A MEAL Patient taking differently: Take 25 mg by mouth 2 (two) times daily with a meal.  05/04/19   Croitoru, Mihai, MD  dicyclomine (BENTYL) 10 MG capsule Take 1 capsule (10 mg total) by mouth every 6 (six) hours as needed for spasms. 06/16/19   Levin Erp, PA  escitalopram (LEXAPRO) 20 MG tablet TAKE 1 TABLET BY MOUTH AT BEDTIME. Patient taking differently: Take 20 mg by mouth at bedtime.  04/25/19   Ladell Pier, MD  fluticasone (FLONASE) 50 MCG/ACT nasal spray Place 1 spray into both nostrils daily. Patient taking differently: Place 1 spray into both nostrils daily as needed for allergies.  07/02/18   Jacqualine Mau, NP  furosemide (LASIX) 80 MG tablet Take 1 tablet (80 mg total) by mouth 2 (two) times daily. 12/07/18   Elgergawy, Silver Huguenin, MD  gabapentin (NEURONTIN) 100 MG capsule TAKE 1 CAPSULE (100 MG TOTAL) BY MOUTH AT BEDTIME. 12/05/18   Ladell Pier, MD  Garlic 756 MG TABS Take 100 mg by mouth daily.     [provider]  glucose 4 GM chewable tablet Chew 1 tablet by mouth as needed for low blood sugar.    [provider]  glucose blood (ACCU-CHEK GUIDE) test strip Use as instructed tid 10/21/18   Ladell Pier, MD  hydrALAZINE (APRESOLINE) 50 MG tablet Take 1 tablet (50 mg total) by mouth 2 (two) times daily. 09/16/18   Ladell Pier, MD  insulin aspart (NOVOLOG) 100 UNIT/ML injection 3 units before meals. Patient taking differently: Inject 3 Units into the skin daily as needed for high blood sugar.  04/06/17   Ladell Pier, MD  Insulin Glargine  (LANTUS SOLOSTAR) 100 UNIT/ML Solostar Pen Inject 4 Units into the skin at bedtime. Patient taking differently: Inject 24 Units into the skin daily as needed (sugar levels).  12/07/18   Elgergawy, Silver Huguenin, MD  Insulin Pen Needle (TRUEPLUS PEN NEEDLES) 32G X 4 MM MISC Use as directed to inject insulin. 03/25/18   Ladell Pier, MD  Insulin Pen Needle 31G X 5 MM MISC Use as directed 03/28/18   Ladell Pier, MD  isosorbide  mononitrate (IMDUR) 30 MG 24 hr tablet Take 1/2 (one-half) tablet by mouth once daily 06/12/19   Croitoru, Mihai, MD  ondansetron (ZOFRAN ODT) 4 MG disintegrating tablet Take every 4-6 hours as needed for nausea 06/16/19   Levin Erp, PA  RENAGEL 800 MG tablet Take 2,400-3,200 mg by mouth 3 (three) times daily with meals. Take 2400 mg with small meals and 3200 mg with large meals 09/22/18   [provider]    Family History Family History  Problem Relation Age of Onset   Diabetes Brother    Stomach cancer Brother    Kidney disease Brother    Heart Problems Maternal Grandmother        pacemaker   Heart disease Maternal Grandfather    Rectal cancer Neg Hx    Esophageal cancer Neg Hx    Liver cancer Neg Hx    Colon cancer Neg Hx     Social History Social History   Tobacco Use   Smoking status: Never Smoker   Smokeless tobacco: Never Used  Substance Use Topics   Alcohol use: No   Drug use: No     Allergies   Codone [hydrocodone], Norvasc [amlodipine besylate], and Sulfa antibiotics   Review of Systems Review of Systems  All other systems reviewed and are negative.    Physical Exam Updated Vital Signs BP (!) 192/91 (BP Location: Right Arm)    Pulse 85    Temp 98.5 F (36.9 C) (Oral)    Resp 20    SpO2 99%   Physical Exam Vitals signs and nursing note reviewed.  Constitutional:      General: She is not in acute distress.    Appearance: Normal appearance. She is well-developed. She is not toxic-appearing.  HENT:       Head: Normocephalic and atraumatic.  Eyes:     General: Lids are normal.     Conjunctiva/sclera: Conjunctivae normal.     Pupils: Pupils are equal, round, and reactive to light.  Neck:     Musculoskeletal: Normal range of motion and neck supple.     Thyroid: No thyroid mass.     Trachea: No tracheal deviation.  Cardiovascular:     Rate and Rhythm: Normal rate and regular rhythm.     Heart sounds: Normal heart sounds. No murmur. No gallop.   Pulmonary:     Effort: Pulmonary effort is normal. No respiratory distress.     Breath sounds: Normal breath sounds. No stridor. No decreased breath sounds, wheezing, rhonchi or rales.  Abdominal:     General: Bowel sounds are normal. There is no distension.     Palpations: Abdomen is soft.     Tenderness: There is abdominal tenderness in the periumbilical area. There is guarding. There is no rebound.    Musculoskeletal: Normal range of motion.        General: No tenderness.  Skin:    General: Skin is warm and dry.     Findings: No abrasion or rash.  Neurological:     Mental Status: She is alert and oriented to person, place, and time.     GCS: GCS eye subscore is 4. GCS verbal subscore is 5. GCS motor subscore is 6.     Cranial Nerves: No cranial nerve deficit.     Sensory: No sensory deficit.  Psychiatric:        Speech: Speech normal.        Behavior: Behavior normal.      ED Treatments /  Results  Labs (all labs ordered are listed, but only abnormal results are displayed) Labs Reviewed  LIPASE, BLOOD  COMPREHENSIVE METABOLIC PANEL  CBC  URINALYSIS, ROUTINE W REFLEX MICROSCOPIC    EKG None  Radiology Ct Abdomen Wo Contrast  Result Date: 06/16/2019 CLINICAL DATA:  Severe epigastric abdominal pain radiating into umbilical area and left side, recently treated for diverticulitis, peritoneal dialysis EXAM: CT ABDOMEN WITHOUT CONTRAST TECHNIQUE: Multidetector CT imaging of the abdomen was performed following the standard  protocol without IV contrast. COMPARISON:  CT abdomen pelvis, 05/25/2019 FINDINGS: Lower chest: No acute abnormality. Hepatobiliary: No solid liver abnormality is seen. No gallstones, gallbladder wall thickening, or biliary dilatation. Pancreas: Unremarkable. No pancreatic ductal dilatation or surrounding inflammatory changes. Spleen: Normal in size without significant abnormality. Adrenals/Urinary Tract: Adrenal glands are unremarkable. Kidneys are normal, without renal calculi, solid lesion, or hydronephrosis. Bladder is unremarkable. Stomach/Bowel: Stomach is within normal limits. The sigmoid colon is incompletely imaged, the most proximal portion is included at the lower extent of the examination and appears thickened, with severe diverticulosis. Vascular/Lymphatic: Aortic atherosclerosis. No enlarged abdominal lymph nodes. Other: There is a small, fat, small bowel, and fluid containing umbilical hernia, which is incompletely imaged although contains a single, edematous appearing loop of mid small bowel without proximal evidence of obstruction. Measured portion of the hernia sac measures approximately 5.2 cm with a neck measuring 2.6 cm (series 2, image 53). There is a partially imaged peritoneal dialysis catheter in the soft tissues of the ventral abdomen, the intra-abdominal portion of the catheter is not imaged. Small volume ascites, in keeping with peritoneal dialysate. Musculoskeletal: No acute or significant osseous findings. IMPRESSION: 1. There is a small, fat, small bowel, and fluid containing umbilical hernia, which is incompletely imaged although contains a single, edematous appearing loop of mid small bowel without proximal evidence of obstruction. Measured portion of the hernia sac measures approximately 5.2 cm with a neck measuring 2.6 cm (series 2, image 53). This has increased in size when compared to prior examination, with new involvement of the bowel, and findings are concerning for  incarcerated or strangulated hernia. 2. The sigmoid colon is incompletely imaged, the most proximal portion is included at the lower extent of the examination and appears thickened, with severe diverticulosis. Findings are in keeping with diverticulitis identified on prior examination although complete imaging of the sigmoid colon would be helpful to assess for complications of diverticulitis if suspected based on persistent symptoms. 3. Small volume ascites, in keeping with peritoneal dialysate. There is a partially imaged peritoneal dialysis catheter in the soft tissues of the ventral abdomen, the intra-abdominal portion of the catheter is not imaged. These results will be called to the ordering clinician or representative by the Radiologist Assistant, and communication documented in the PACS or zVision Dashboard. Electronically Signed   By: Eddie Candle M.D.   On: 06/16/2019 16:38    Procedures Procedures (including critical care time)  Medications Ordered in ED Medications  sodium chloride flush (NS) 0.9 % injection 3 mL (has no administration in time range)  morphine 4 MG/ML injection 6 mg (has no administration in time range)  diphenhydrAMINE (BENADRYL) injection 12.5 mg (has no administration in time range)     Initial Impression / Assessment and Plan / ED Course  I have reviewed the triage vital signs and the nursing notes.  Pertinent labs & imaging results that were available during my care of the patient were reviewed by me and considered in my medical decision making (  see chart for details).       Patient's pain treated with morphine here. Patient CT scan with evidence of incarcerated bowel.  Spoke with Dr. Rosendo Gros from general surgery and he will come and see the patient  Final Clinical Impressions(s) / ED Diagnoses   Final diagnoses:  None    ED Discharge Orders    None       Lacretia Leigh, MD 06/16/19 Lattie Corns    Lacretia Leigh, MD 06/16/19 [phone]

## 2019-06-16 NOTE — Op Note (Signed)
06/16/2019  10:36 PM  PATIENT:  Patricia [name]  63 y.o. female  PRE-OPERATIVE DIAGNOSIS:  INCARCERATED UMBILICAL HERNIA  POST-OPERATIVE DIAGNOSIS:  same  PROCEDURE:  Procedure(s): LAPAROSCOPY DIAGNOSTIC,PRIMARY REPAIR OF UMBILICAL HERNIA  SURGEON:  Surgeon(s) and Role:    * Ralene Ok, MD - Primary   ANESTHESIA:   local and general  EBL:  minimal   BLOOD ADMINISTERED:none  DRAINS: none   LOCAL MEDICATIONS USED:  BUPIVICAINE   SPECIMEN:  No Specimen  DISPOSITION OF SPECIMEN:  N/A  COUNTS:  YES  TOURNIQUET:  * No tourniquets in log *  DICTATION: .Dragon Dictation  Details of the procedure:   Findings:  Patient had a incarcerated portion of transverse colon within the umbilical hernia.  This was viable, there is no bruising to the bowel.  All other portions of the small bowel were viable and were not within the hernia.  The PD catheter was within the pelvis of the abdominal cavity.  There was some dialysis fluid within the pelvis as well.  The omentum appeared to be fused to the falciform ligament. The hernia itself was approximately 1 cm.  There was seen to be a busted previous Prolene/Novafil x1 at this area.  Details of procedure:  After the patient was consented patient was taken back to the operating room patient was then placed in supine position bilateral SCDs in place.  The patient was prepped and draped in the usual sterile fashion. After antibiotics were confirmed a timeout was called and all facts were verified. The Veress needle technique was used to insuflate the abdomen at Palmer's point. The abdomen was insufflated to 14 mm mercury. Subsequently a 5 mm trocar was placed a camera inserted there was no injury to any intra-abdominal organs.  A second camera port was in placed into the left lower quadrant.    There was seen to be an incarcerated 1 cm umbilical hernia.  This contained the transverse colon.  This was reduced in its entirety.  There is no  bruising to the transverse colon.  The small bowel was evaluated and not involved within the hernia itself.  There was some dialysis fluid within the pelvis.  The omentum was seen to be fused to the falciform ligament.    The fascia at the hernia was reapproximated using a #1Novafil x 3.  Secondary to the fact that the patient does PD catheter dialysis I did not feel it was the safest thing to place a piece of mesh within the abdominal cavity.  The pneumoperitoneum was evacuated  & all trocars  were removed. The skin was reapproximated with 4-0  Monocryl sutures in a subcuticular fashion. The skin was dressed with Dermabond.  The patient was taken to the recovery room in stable condition.  Type of repair -primary suture  Mesh overlap - none   PLAN OF CARE: Admit for overnight observation  PATIENT DISPOSITION:  PACU - hemodynamically stable.   Delay start of Pharmacological VTE agent (>24hrs) due to surgical blood loss or risk of bleeding: not applicable

## 2019-06-16 NOTE — H&P (Signed)
History and Physical    Patricia [name] XEN:[id number] DOB: [birth year] DOA: 06/16/2019  PCP: Ladell Pier, MD Patient coming from: Home  Chief Complaint: Abdominal pain, nausea, vomiting  HPI: Patricia [name] is a 63 y.o. female with medical history significant of anemia, cardiomyopathy, CHF, ESRD on peritoneal dialysis, insulin-dependent type 2 diabetes, hypertension, obesity, cesarean section, hysterectomy presenting to the hospital for evaluation of abdominal pain, nausea, and vomiting.  Patient was seen by gastroenterology today and had an outpatient CT scan performed which showed a strangulated hernia.  History provided by patient and daughter at bedside.  Patient states she was seen in the emergency room 3 weeks ago for abdominal pain and told she had diverticulitis.  She was treated with a course of antibiotics.  She has continued to have severe abdominal pain which is located in the periumbilical region.  Also having nonbloody nonbilious emesis.  Having regular soft bowel movements.  Denies diarrhea, blood in stool, or constipation.  Denies fevers or chills.  She has not noticed any change in the color of the fluid that is removed via peritoneal dialysis.  She still makes urine.  Denies dysuria, urinary frequency, or urgency.  Daughter states that patient has had previous umbilical hernia repair surgery.  Patient she did not take her blood pressure medications today.  No other complaints.  ED Course: Blood pressure elevated, highest recorded reading 210/187.  Afebrile and remainder of vital signs stable.  Labs showing no leukocytosis.  Hemoglobin 9.3, at baseline.  Potassium 3.2.  Lipase and LFTs normal.  Patient received morphine and Benadryl in the ED.  CT abdomen pelvis done earlier today with findings concerning for incarcerated or strangulated umbilical hernia.  ED provider discussed the case with Dr. Rosendo Gros from general surgery who has seen the patient and requesting admission by  hospitalist service.  He plans on taking her to the OR tonight, pending SARS-CoV-2 rapid test result.  Review of Systems:  All systems reviewed and apart from history of presenting illness, are negative.  Past Medical History:  Diagnosis Date   Allergy    Anemia    Anxiety    Blood transfusion without reported diagnosis    Cardiomyopathy ([place]) 11/2016   no ischemic eval due to CKD   CHF (congestive heart failure) (HCC)    CKD (chronic kidney disease), stage IV (HCC)    Depression    Diabetes mellitus without complication ([place])    type 2   Dyspnea    Hypertension    Obesity     Past Surgical History:  Procedure Laterality Date   ABDOMINAL HYSTERECTOMY     BREAST SURGERY     reduction   CESAREAN SECTION  1982, 1988   LIPOMA EXCISION     back  Dr. Excell Seltzer 03-22-18   LIPOMA EXCISION N/A 03/22/2018   Procedure: EXCISION OF BACK LIPOMA;  Surgeon: Excell Seltzer, MD;  Location: [facility];  Service: General;  Laterality: N/A;   REDUCTION MAMMAPLASTY Bilateral      reports that she has never smoked. She has never used smokeless tobacco. She reports that she does not drink alcohol or use drugs.  Allergies  Allergen Reactions   Codone [Hydrocodone] Itching   Norvasc [Amlodipine Besylate] Swelling    Lower extremity?   Sulfa Antibiotics Itching and Rash    Family History  Problem Relation Age of Onset   Diabetes Brother    Stomach cancer Brother    Kidney disease Brother    Heart  Problems Maternal Grandmother        pacemaker   Heart disease Maternal Grandfather    Rectal cancer Neg Hx    Esophageal cancer Neg Hx    Liver cancer Neg Hx    Colon cancer Neg Hx     Prior to Admission medications   Medication Sig Start Date End Date Taking? Authorizing Provider  ACCU-CHEK FASTCLIX LANCETS MISC Uad tid 10/21/18   Ladell Pier, MD  acetaminophen (TYLENOL) 325 MG tablet Take 2 tablets (650 mg total) by mouth every 6 (six) hours as needed.  05/25/19   Varney Biles, MD  albuterol (PROVENTIL HFA;VENTOLIN HFA) 108 (90 Base) MCG/ACT inhaler Inhale 1-2 puffs into the lungs every 6 (six) hours as needed for wheezing or shortness of breath. 10/11/18   Marney Setting, NP  aspirin EC 81 MG tablet Take 1 tablet (81 mg total) by mouth daily. 01/04/17   Maren Reamer, MD  atorvastatin (LIPITOR) 20 MG tablet Take 1/2 (one-half) tablet by mouth once daily 06/12/19   Ladell Pier, MD  Bee Pollen 550 MG CAPS Take 550 mg by mouth daily as needed (to boost the immune system).     [provider]  Blood Glucose Monitoring Suppl (ACCU-CHEK GUIDE) w/Device KIT 1 each by Does not apply route 3 (three) times daily. Use tid as directed 10/21/18   Ladell Pier, MD  calcium carbonate (TUMS - DOSED IN MG ELEMENTAL CALCIUM) 500 MG chewable tablet Chew 1 tablet by mouth 3 (three) times daily as needed for indigestion or heartburn.     [provider]  carvedilol (COREG) 25 MG tablet TAKE 1 TABLET BY MOUTH TWICE DAILY WITH A MEAL Patient taking differently: Take 25 mg by mouth 2 (two) times daily with a meal.  05/04/19   Croitoru, Mihai, MD  dicyclomine (BENTYL) 10 MG capsule Take 1 capsule (10 mg total) by mouth every 6 (six) hours as needed for spasms. 06/16/19   Levin Erp, PA  escitalopram (LEXAPRO) 20 MG tablet TAKE 1 TABLET BY MOUTH AT BEDTIME. Patient taking differently: Take 20 mg by mouth at bedtime.  04/25/19   Ladell Pier, MD  fluticasone (FLONASE) 50 MCG/ACT nasal spray Place 1 spray into both nostrils daily. Patient taking differently: Place 1 spray into both nostrils daily as needed for allergies.  07/02/18   Jacqualine Mau, NP  furosemide (LASIX) 80 MG tablet Take 1 tablet (80 mg total) by mouth 2 (two) times daily. 12/07/18   Elgergawy, Silver Huguenin, MD  gabapentin (NEURONTIN) 100 MG capsule TAKE 1 CAPSULE (100 MG TOTAL) BY MOUTH AT BEDTIME. 12/05/18   Ladell Pier, MD  Garlic 465 MG TABS  Take 100 mg by mouth daily.     [provider]  glucose 4 GM chewable tablet Chew 1 tablet by mouth as needed for low blood sugar.    [provider]  glucose blood (ACCU-CHEK GUIDE) test strip Use as instructed tid 10/21/18   Ladell Pier, MD  hydrALAZINE (APRESOLINE) 50 MG tablet Take 1 tablet (50 mg total) by mouth 2 (two) times daily. 09/16/18   Ladell Pier, MD  insulin aspart (NOVOLOG) 100 UNIT/ML injection 3 units before meals. Patient taking differently: Inject 3 Units into the skin daily as needed for high blood sugar.  04/06/17   Ladell Pier, MD  Insulin Glargine (LANTUS SOLOSTAR) 100 UNIT/ML Solostar Pen Inject 4 Units into the skin at bedtime. Patient taking differently: Inject 24  Units into the skin daily as needed (sugar levels).  12/07/18   Elgergawy, Silver Huguenin, MD  Insulin Pen Needle (TRUEPLUS PEN NEEDLES) 32G X 4 MM MISC Use as directed to inject insulin. 03/25/18   Ladell Pier, MD  Insulin Pen Needle 31G X 5 MM MISC Use as directed 03/28/18   Ladell Pier, MD  isosorbide mononitrate (IMDUR) 30 MG 24 hr tablet Take 1/2 (one-half) tablet by mouth once daily 06/12/19   Croitoru, Mihai, MD  ondansetron (ZOFRAN ODT) 4 MG disintegrating tablet Take every 4-6 hours as needed for nausea 06/16/19   Levin Erp, PA  RENAGEL 800 MG tablet Take 2,400-3,200 mg by mouth 3 (three) times daily with meals. Take 2400 mg with small meals and 3200 mg with large meals 09/22/18   [provider]    Physical Exam: Vitals:   06/16/19 1943 06/16/19 1945 06/16/19 1952 06/16/19 2109  BP:  (!) 194/80  (!) 180/74  Pulse:  83  76  Resp: _0 Temp:      TempSrc:      SpO2:  100%  96%  Weight:   71.6 kg   Height:   5' 3" (1.6 m)     Physical Exam  Constitutional: She is oriented to person, place, and time. She appears well-developed and well-nourished. No distress.  Resting comfortably  HENT:  Head: Normocephalic and atraumatic.    Eyes: Right eye exhibits no discharge. Left eye exhibits no discharge.  Neck: Neck supple.  Cardiovascular: Normal rate, regular rhythm and intact distal pulses.  Pulmonary/Chest: Effort normal and breath sounds normal. No respiratory distress. She has no wheezes. She has no rales.  Abdominal: Soft. Bowel sounds are normal. She exhibits no distension. There is abdominal tenderness. There is guarding. There is no rebound.  Epigastrium tender to palpation with guarding No rebound or rigidity  Musculoskeletal:        General: No edema.  Neurological: She is alert and oriented to person, place, and time.  Skin: Skin is warm and dry. She is not diaphoretic.     Labs on Admission: I have personally reviewed following labs and imaging studies  CBC: Recent Labs  Lab 06/16/19 1843  WBC 8.3  HGB 9.3*  HCT 28.4*  MCV 95.9  PLT 758   Basic Metabolic Panel: Recent Labs  Lab 06/16/19 1843  NA 138  K 3.2*  CL 100  CO2 23  GLUCOSE 123*  BUN 27*  CREATININE 7.36*  CALCIUM 8.5*   GFR: Estimated Creatinine Clearance: 7.7 mL/min (A) (by C-G formula based on SCr of 7.36 mg/dL (H)). Liver Function Tests: Recent Labs  Lab 06/16/19 1843  AST 15  ALT 15  ALKPHOS 58  BILITOT 0.7  PROT 6.4*  ALBUMIN 2.9*   Recent Labs  Lab 06/16/19 1843  LIPASE 32   No results for input(s): AMMONIA in the last 168 hours. Coagulation Profile: No results for input(s): INR, PROTIME in the last 168 hours. Cardiac Enzymes: No results for input(s): CKTOTAL, CKMB, CKMBINDEX, TROPONINI in the last 168 hours. BNP (last 3 results) No results for input(s): PROBNP in the last 8760 hours. HbA1C: No results for input(s): HGBA1C in the last 72 hours. CBG: No results for input(s): GLUCAP in the last 168 hours. Lipid Profile: No results for input(s): CHOL, HDL, LDLCALC, TRIG, CHOLHDL, LDLDIRECT in the last 72 hours. Thyroid Function Tests: No results for input(s): TSH, T4TOTAL, FREET4, T3FREE, THYROIDAB  in the last 72 hours.  Anemia Panel: No results for input(s): VITAMINB12, FOLATE, FERRITIN, TIBC, IRON, RETICCTPCT in the last 72 hours. Urine analysis:    Component Value Date/Time   COLORURINE YELLOW 06/02/2018 1713   APPEARANCEUR HAZY (A) 06/02/2018 1713   LABSPEC 1.012 06/02/2018 1713   PHURINE 5.0 06/02/2018 1713   GLUCOSEU 50 (A) 06/02/2018 1713   HGBUR NEGATIVE 06/02/2018 1713   BILIRUBINUR NEGATIVE 06/02/2018 1713   BILIRUBINUR 1.0 10/09/2016 1306   KETONESUR NEGATIVE 06/02/2018 1713   PROTEINUR >=300 (A) 06/02/2018 1713   UROBILINOGEN 0.2 04/24/2018 1529   NITRITE NEGATIVE 06/02/2018 1713   LEUKOCYTESUR NEGATIVE 06/02/2018 1713    Radiological Exams on Admission: Ct Abdomen Wo Contrast  Result Date: 06/16/2019 CLINICAL DATA:  Severe epigastric abdominal pain radiating into umbilical area and left side, recently treated for diverticulitis, peritoneal dialysis EXAM: CT ABDOMEN WITHOUT CONTRAST TECHNIQUE: Multidetector CT imaging of the abdomen was performed following the standard protocol without IV contrast. COMPARISON:  CT abdomen pelvis, 05/25/2019 FINDINGS: Lower chest: No acute abnormality. Hepatobiliary: No solid liver abnormality is seen. No gallstones, gallbladder wall thickening, or biliary dilatation. Pancreas: Unremarkable. No pancreatic ductal dilatation or surrounding inflammatory changes. Spleen: Normal in size without significant abnormality. Adrenals/Urinary Tract: Adrenal glands are unremarkable. Kidneys are normal, without renal calculi, solid lesion, or hydronephrosis. Bladder is unremarkable. Stomach/Bowel: Stomach is within normal limits. The sigmoid colon is incompletely imaged, the most proximal portion is included at the lower extent of the examination and appears thickened, with severe diverticulosis. Vascular/Lymphatic: Aortic atherosclerosis. No enlarged abdominal lymph nodes. Other: There is a small, fat, small bowel, and fluid containing umbilical hernia,  which is incompletely imaged although contains a single, edematous appearing loop of mid small bowel without proximal evidence of obstruction. Measured portion of the hernia sac measures approximately 5.2 cm with a neck measuring 2.6 cm (series 2, image 53). There is a partially imaged peritoneal dialysis catheter in the soft tissues of the ventral abdomen, the intra-abdominal portion of the catheter is not imaged. Small volume ascites, in keeping with peritoneal dialysate. Musculoskeletal: No acute or significant osseous findings. IMPRESSION: 1. There is a small, fat, small bowel, and fluid containing umbilical hernia, which is incompletely imaged although contains a single, edematous appearing loop of mid small bowel without proximal evidence of obstruction. Measured portion of the hernia sac measures approximately 5.2 cm with a neck measuring 2.6 cm (series 2, image 53). This has increased in size when compared to prior examination, with new involvement of the bowel, and findings are concerning for incarcerated or strangulated hernia. 2. The sigmoid colon is incompletely imaged, the most proximal portion is included at the lower extent of the examination and appears thickened, with severe diverticulosis. Findings are in keeping with diverticulitis identified on prior examination although complete imaging of the sigmoid colon would be helpful to assess for complications of diverticulitis if suspected based on persistent symptoms. 3. Small volume ascites, in keeping with peritoneal dialysate. There is a partially imaged peritoneal dialysis catheter in the soft tissues of the ventral abdomen, the intra-abdominal portion of the catheter is not imaged. These results will be called to the ordering clinician or representative by the Radiologist Assistant, and communication documented in the PACS or zVision Dashboard. Electronically Signed   By: Eddie Candle M.D.   On: 06/16/2019 16:38    Assessment/Plan Principal  Problem:   Strangulated umbilical hernia Active Problems:   CHF (congestive heart failure) (HCC)   ESRD (end stage renal disease) (HCC)   Hypokalemia  Hypertensive urgency   Incarcerated/strangulated umbilical hernia Afebrile and no leukocytosis. CT abdomen pelvis done earlier today with findings concerning for incarcerated or strangulated umbilical hernia.  -Seen by Dr. Rosendo Gros from general surgery, planning on taking her to the OR tonight -Cefotetan preop per surgery.  Start Zosyn after surgery for short-term 24-48 hr antimicrobial prophylaxis. -Morphine PRN pain -Zofran PRN nausea/vomiting -Continue to monitor CBC -Check lactic acid level -Keep n.p.o.  Hypokalemia Likely secondary to GI loss from vomiting and decreased p.o. intake.  Potassium 3.2. -Replete potassium.  Check magnesium level and replete if needed.  Continue to monitor electrolytes.  Hypertensive urgency Blood pressure elevated, systolic currently in 659D. -IV hydralazine PRN SBP >160  ESRD on peritoneal dialysis No signs of volume overload at present.  Has mild hypokalemia and potassium will be repleted.  Bicarb 23. -Consult nephrology in a.m.  Insulin-dependent type 2 diabetes -Check A1c.  Sliding scale insulin every 4 hours as patient is currently n.p.o. CBG checks.  Chronic combined systolic and diastolic congestive heart failure Last echo done February 2020 with left ventricular EF 40 to 35% and diastolic parameters consistent with impaired relaxation. -Consult nephrology in a.m. for dialysis -Hold home diuretic at this time  DVT prophylaxis: SCDs Code Status: Full code.  Discussed with patient and daughter at bedside. Family Communication: Daughter updated at bedside. Disposition Plan: Anticipate discharge after surgery. Consults called: General surgery (Dr. Rosendo Gros) Admission status: It is my clinical opinion that admission to INPATIENT is reasonable and necessary in this 63 y.o.  female  presenting with abdominal pain, nausea/vomiting secondary to incarcerated or strangulated umbilical hernia.  Patient is going for urgent surgery tonight.  High risk of decompensation.  Given the aforementioned, the predictability of an adverse outcome is felt to be significant. I expect that the patient will require at least 2 midnights in the hospital to treat this condition.  The medical decision making on this patient was of high complexity and the patient is at high risk for clinical deterioration, therefore this is a level 3 visit.  Shela Leff MD [facility] Pager [phone]  If 7PM-7AM, please contact night-coverage [email or web address] Password [facility]  06/16/2019, 9:24 PM

## 2019-06-16 NOTE — Anesthesia Preprocedure Evaluation (Addendum)
Anesthesia Evaluation  Patient identified by MRN, date of birth, ID band Patient awake    Reviewed: Allergy & Precautions, H&P , NPO status , Patient's Chart, lab work & pertinent test results, reviewed documented beta blocker date and time   Airway Mallampati: I  TM Distance: >3 FB Neck ROM: Full    Dental no notable dental hx. (+) Poor Dentition, Dental Advisory Given,    Pulmonary neg pulmonary ROS,    Pulmonary exam normal breath sounds clear to auscultation       Cardiovascular hypertension, Pt. on medications +CHF  + Valvular Problems/Murmurs MR  Rhythm:Regular Rate:Normal     Neuro/Psych Anxiety Depression negative neurological ROS     GI/Hepatic negative GI ROS, Neg liver ROS,   Endo/Other  diabetes, Type 2, Insulin Dependent, Oral Hypoglycemic Agents  Renal/GU ESRF and DialysisRenal disease  negative genitourinary   Musculoskeletal   Abdominal   Peds  Hematology  (+) Blood dyscrasia, anemia ,   Anesthesia Other Findings   Reproductive/Obstetrics negative OB ROS                          Anesthesia Physical Anesthesia Plan  ASA: III and emergent  Anesthesia Plan: General   Post-op Pain Management:    Induction: Intravenous, Rapid sequence and Cricoid pressure planned  PONV Risk Score and Plan: 4 or greater and Ondansetron, Midazolam, Dexamethasone and Treatment may vary due to age or medical condition  Airway Management Planned: Oral ETT  Additional Equipment:   Intra-op Plan:   Post-operative Plan: Extubation in OR  Informed Consent: I have reviewed the patients History and Physical, chart, labs and discussed the procedure including the risks, benefits and alternatives for the proposed anesthesia with the patient or authorized representative who has indicated his/her understanding and acceptance.     Dental advisory given  Plan Discussed with: CRNA  Anesthesia Plan  Comments:         Anesthesia Quick Evaluation

## 2019-06-16 NOTE — Progress Notes (Signed)
Chief Complaint: Abdominal pain  HPI:    [name] is a 63 year old African-American female with a past medical history as listed below including CHF and ESRD on peritoneal dialysis, known to Dr. [name] for iron deficiency anemia, who was referred to me by Ladell Pier, MD for a complaint of abdominal pain.      06/03/2017 colonoscopy for iron deficiency anemia with decreased sphincter tone, diverticulosis in the left colon and otherwise normal.  Repeat recommended in 10 years for screening.  EGD showed gastritis and was otherwise normal.  It was thought most likely patient's IDA was due to chronic kidney disease.    05/25/2019 ED visit for abdominal pain described as generalized for the past 2 weeks.  Poor appetite with gas-like feeling.  Also constipation.  CMP with a creatinine was 6.71, AST 12, total bilirubin 0.2, potassium 3.  CBC with a hemoglobin of 8.6.  CT the abdomen pelvis without contrast showed sigmoid diverticulitis with no abscess or perforation.  Small umbilical hernia with a small amount of fluid within the herniated fat.  Patient given Cipro and Flagyl.    Today, the patient presents clinic and explains that she has severe abdominal pain which seems to radiate from her epigastrium down into her periumbilical area and sometimes over to her left side.  This is rated as a 9-10/10 throughout the day, she does seem to be able to go to sleep at night.  Nothing seems to help this pain.  Using Tylenol at home to no effect.  Also describes nausea and vomiting.  She is constantly nauseous throughout the day and if she tries to eat or drink anything it typically comes back up a few hours later.  Finished her antibiotics for diverticulitis which she was given in the ER a couple of weeks ago but this pain has never changed.  Does tell me she had fluid drawn at the kidney center and they sent it off for culture and she was told that she did not have an infection in the fluid.  Associated symptoms  include some subjective weight loss.  Bowel movements are fairly normal.    Denies fever, chills, heartburn, reflux or change in bowel habits.  Past Medical History:  Diagnosis Date   Allergy    Anemia    Anxiety    Blood transfusion without reported diagnosis    Cardiomyopathy ([place]) 11/2016   no ischemic eval due to CKD   CHF (congestive heart failure) (HCC)    CKD (chronic kidney disease), stage IV (HCC)    Depression    Diabetes mellitus without complication ([place])    type 2   Dyspnea    Hypertension    Obesity     Past Surgical History:  Procedure Laterality Date   ABDOMINAL HYSTERECTOMY     BREAST SURGERY     reduction   CESAREAN SECTION  1982, 1988   LIPOMA EXCISION     back  Dr. Excell Seltzer 03-22-18   LIPOMA EXCISION N/A 03/22/2018   Procedure: EXCISION OF BACK LIPOMA;  Surgeon: Excell Seltzer, MD;  Location: [facility];  Service: General;  Laterality: N/A;   REDUCTION MAMMAPLASTY Bilateral     Current Outpatient Medications  Medication Sig Dispense Refill   ACCU-CHEK FASTCLIX LANCETS MISC Uad tid 100 each 12   acetaminophen (TYLENOL) 325 MG tablet Take 2 tablets (650 mg total) by mouth every 6 (six) hours as needed. 30 tablet 0   albuterol (PROVENTIL HFA;VENTOLIN HFA) 108 (90 Base)  MCG/ACT inhaler Inhale 1-2 puffs into the lungs every 6 (six) hours as needed for wheezing or shortness of breath. 1 Inhaler 0   aspirin EC 81 MG tablet Take 1 tablet (81 mg total) by mouth daily. 90 tablet 3   atorvastatin (LIPITOR) 20 MG tablet Take 1/2 (one-half) tablet by mouth once daily 45 tablet 0   Bee Pollen 550 MG CAPS Take 550 mg by mouth daily as needed (to boost the immune system).      Blood Glucose Monitoring Suppl (ACCU-CHEK GUIDE) w/Device KIT 1 each by Does not apply route 3 (three) times daily. Use tid as directed 1 kit 0   calcium carbonate (TUMS - DOSED IN MG ELEMENTAL CALCIUM) 500 MG chewable tablet Chew 1 tablet by mouth 3 (three) times daily as  needed for indigestion or heartburn.      carvedilol (COREG) 25 MG tablet TAKE 1 TABLET BY MOUTH TWICE DAILY WITH A MEAL (Patient taking differently: Take 25 mg by mouth 2 (two) times daily with a meal. ) 60 tablet 10   escitalopram (LEXAPRO) 20 MG tablet TAKE 1 TABLET BY MOUTH AT BEDTIME. (Patient taking differently: Take 20 mg by mouth at bedtime. ) 30 tablet 2   fluticasone (FLONASE) 50 MCG/ACT nasal spray Place 1 spray into both nostrils daily. (Patient taking differently: Place 1 spray into both nostrils daily as needed for allergies. ) 16 g 0   furosemide (LASIX) 80 MG tablet Take 1 tablet (80 mg total) by mouth 2 (two) times daily. 60 tablet 0   gabapentin (NEURONTIN) 100 MG capsule TAKE 1 CAPSULE (100 MG TOTAL) BY MOUTH AT BEDTIME. 90 capsule 3   Garlic 195 MG TABS Take 100 mg by mouth daily.      glucose 4 GM chewable tablet Chew 1 tablet by mouth as needed for low blood sugar.     glucose blood (ACCU-CHEK GUIDE) test strip Use as instructed tid 100 each 12   hydrALAZINE (APRESOLINE) 50 MG tablet Take 1 tablet (50 mg total) by mouth 2 (two) times daily. 60 tablet 2   insulin aspart (NOVOLOG) 100 UNIT/ML injection 3 units before meals. (Patient taking differently: Inject 3 Units into the skin daily as needed for high blood sugar. ) 60 mL 3   Insulin Glargine (LANTUS SOLOSTAR) 100 UNIT/ML Solostar Pen Inject 4 Units into the skin at bedtime. (Patient taking differently: Inject 24 Units into the skin daily as needed (sugar levels). ) 15 mL 2   Insulin Pen Needle (TRUEPLUS PEN NEEDLES) 32G X 4 MM MISC Use as directed to inject insulin. 100 each 3   Insulin Pen Needle 31G X 5 MM MISC Use as directed 100 each 12   isosorbide mononitrate (IMDUR) 30 MG 24 hr tablet Take 1/2 (one-half) tablet by mouth once daily 15 tablet 6   RENAGEL 800 MG tablet Take 2,400-3,200 mg by mouth 3 (three) times daily with meals. Take 2400 mg with small meals and 3200 mg with large meals     No current  facility-administered medications for this visit.     Allergies as of 06/16/2019 - Review Complete 06/16/2019  Allergen Reaction Noted   Codone [hydrocodone] Itching 04/28/2016   Norvasc [amlodipine besylate] Swelling 11/04/2016   Sulfa antibiotics Itching and Rash 02/08/2013    Family History  Problem Relation Age of Onset   Diabetes Brother    Stomach cancer Brother    Kidney disease Brother    Heart Problems Maternal Grandmother  pacemaker   Heart disease Maternal Grandfather    Rectal cancer Neg Hx    Esophageal cancer Neg Hx    Liver cancer Neg Hx    Colon cancer Neg Hx     Social History   Socioeconomic History   Marital status: Divorced    Spouse name: Not on file   Number of children: 2   Years of education: Not on file   Highest education level: Not on file  Occupational History   Occupation: disabled  Social Designer, fashion/clothing strain: Not on file   Food insecurity    Worry: Not on file    Inability: Not on file   Transportation needs    Medical: Not on file    Non-medical: Not on file  Tobacco Use   Smoking status: Never Smoker   Smokeless tobacco: Never Used  Substance and Sexual Activity   Alcohol use: No   Drug use: No   Sexual activity: Not Currently  Lifestyle   Physical activity    Days per week: Not on file    Minutes per session: Not on file   Stress: Not on file  Relationships   Social connections    Talks on phone: Not on file    Gets together: Not on file    Attends religious service: Not on file    Active member of club or organization: Not on file    Attends meetings of clubs or organizations: Not on file    Relationship status: Not on file   Intimate partner violence    Fear of current or ex partner: Not on file    Emotionally abused: Not on file    Physically abused: Not on file    Forced sexual activity: Not on file  Other Topics Concern   Not on file  Social History Narrative     Not on file    Review of Systems:    Constitutional: No weight loss, fever or chills Cardiovascular: No chest pain Respiratory: No SOB Gastrointestinal: See HPI and otherwise negative   Physical Exam:  Vital signs: BP 124/62    Pulse 88    Temp 97.6 F (36.4 C)    Ht 5' 3"  (1.6 m)    Wt 158 lb (71.7 kg)    BMI 27.99 kg/m   Constitutional:   Pleasant AA female appears to be in mild distress, clutching her abdomen and burping, Well developed, Well nourished, alert and cooperative Respiratory: Respirations even and unlabored. Lungs clear to auscultation bilaterally.   No wheezes, crackles, or rhonchi.  Cardiovascular: Normal S1, S2. No MRG. Regular rate and rhythm. No peripheral edema, cyanosis or pallor.  Gastrointestinal:  Soft, nondistended, Moderate Generalized ttp, some worse in epigastrum and periumbilical area. No rebound or guarding. Normal bowel sounds. No appreciable masses or hepatomegaly. Rectal:  Not performed.  Psychiatric:  Demonstrates good judgement and reason without abnormal affect or behaviors.  MOST RECENT LABS AND IMAGING: CBC    Component Value Date/Time   WBC 7.7 06/06/2019 1118   WBC 8.2 05/24/2019 2007   RBC 3.08 (L) 06/06/2019 1118   RBC 2.76 (L) 05/24/2019 2007   HGB 9.6 (L) 06/06/2019 1118   HCT 29.0 (L) 06/06/2019 1118   PLT 235 06/06/2019 1118   MCV 94 06/06/2019 1118   MCH 31.2 06/06/2019 1118   MCH 31.2 05/24/2019 2007   MCHC 33.1 06/06/2019 1118   MCHC 32.8 05/24/2019 2007   RDW 15.1 06/06/2019 1118  LYMPHSABS 2.4 01/04/2017 1220   MONOABS 0.3 11/29/2016 0325   EOSABS 0.1 01/04/2017 1220   BASOSABS 0.0 01/04/2017 1220    CMP     Component Value Date/Time   NA 135 05/24/2019 2007   NA 143 05/23/2018 1607   K 3.0 (L) 05/24/2019 2007   CL 98 05/24/2019 2007   CO2 25 05/24/2019 2007   GLUCOSE 167 (H) 05/24/2019 2007   BUN 36 (H) 05/24/2019 2007   BUN 73 (H) 05/23/2018 1607   CREATININE 6.71 (H) 05/24/2019 2007   CREATININE 2.22  (H) 12/25/2016 1000   CALCIUM 8.4 (L) 05/24/2019 2007   CALCIUM 8.0 (L) 08/30/2018 1300   PROT 5.8 (L) 05/24/2019 2007   ALBUMIN 2.5 (L) 05/24/2019 2007   AST 12 (L) 05/24/2019 2007   ALT 11 05/24/2019 2007   ALKPHOS 55 05/24/2019 2007   BILITOT 0.2 (L) 05/24/2019 2007   GFRNONAA 6 (L) 05/24/2019 2007   GFRNONAA 24 (L) 12/25/2016 1000   GFRAA 7 (L) 05/24/2019 2007   GFRAA 27 (L) 12/25/2016 1000    Assessment: 1.  Abdominal pain: Continues per patient, same abdominal pain that she was having the ER and diagnosed with diverticulitis, fluid supposedly checked for infection recently at the dialysis center; consider complicated diverticulitis versus SBP versus other 2.  Nausea and vomiting: Every time she eats or drinks over the past 3 weeks, no different after antibiotics given for diverticulitis from the ER  Plan: 1.  We will request most recent labs from the [facility] kidney center done earlier this week. 2.  Repeat CT abdomen pelvis without contrast due to end-stage renal disease.  Ordered this stat. 3.  Prescribed Dicyclomine 10 mg every 6 hours #30 with 1 refill. 4.  Prescribe Zofran 4 mg ODT every 4-6 hours as needed for nausea. 5.  We will request documentation from [facility] kidney center in regards to peritoneal fluid culture recently, if this has not been done it will need to be done given her peritoneal dialysis and abdominal pain. 6.  Patient was given ER precautions.  If symptoms worsen or she cannot handle them at home then she should return to the ER. 7.  Patient to follow in clinic per recommendations after imaging above.  Ellouise Newer, PA-C [facility] 06/16/2019, 2:18 PM  Cc: Ladell Pier, MD

## 2019-06-16 NOTE — ED Notes (Signed)
Taken to short stay 36 at this time.

## 2019-06-16 NOTE — ED Triage Notes (Signed)
Patient sent by doctor, who called today and stated her CT scan showed strangulated small bowel. Patient has had abdominal pain for over a month with N/V.

## 2019-06-16 NOTE — Patient Instructions (Signed)
You have been scheduled for a CT scan of the abdomen and pelvis at [facility] ([address]---this is in the same building as Press [facility]).   You are scheduled on 06/16/19 at 330pm. You should arrive 15 minutes prior to  your appointment time for registration. Please follow the written instructions below on the day of your exam:     .      You may take any medications as prescribed with a small amount of water, if necessary. If you take any of the following medications: METFORMIN, GLUCOPHAGE, GLUCOVANCE, AVANDAMET, RIOMET, FORTAMET, Arjay MET, JANUMET, GLUMETZA or METAGLIP, you MAY be asked to HOLD this medication 48 hours AFTER the exam.  The purpose of you drinking the oral contrast is to aid in the visualization of your intestinal tract. The contrast solution may cause some diarrhea. Depending on your individual set of symptoms, you may also receive an intravenous injection of x-ray contrast/dye. Plan on being at [facility] for 30 minutes or longer, depending on the type of exam you are having performed.  This test typically takes 30-45 minutes to complete.  If you have any questions regarding your exam or if you need to reschedule, you may call the CT department at [phone] between the hours of 8:00 am and 5:00 pm, Monday-Friday.  We have sent the following medications to your pharmacy for you to pick up at your convenience: Dicyclomine Zofran  Thank you for entrusting me with your care and choosing [facility].  Ellouise Newer PA    ________________________________________________________________________

## 2019-06-16 NOTE — ED Notes (Signed)
ED TO INPATIENT HANDOFF REPORT  ED Nurse Name and Phone #: Calton Golds [phone]  S Name/Age/Gender Patricia [name] 63 y.o. female Room/Bed: 015C/015C  Code Status   Code Status: Prior  Home/SNF/Other Home Patient oriented to: self, place, time and situation Is this baseline? Yes   Triage Complete: Triage complete  Chief Complaint Stomach cramps, N/V  Triage Note Patient sent by doctor, who called today and stated her CT scan showed strangulated small bowel. Patient has had abdominal pain for over a month with N/V.    Allergies Allergies  Allergen Reactions  . Codone [Hydrocodone] Itching  . Norvasc [Amlodipine Besylate] Swelling    Lower extremity?  . Sulfa Antibiotics Itching and Rash    Level of Care/Admitting Diagnosis ED Disposition    ED Disposition Condition Comment   Admit  The patient appears reasonably stabilized for admission considering the current resources, flow, and capabilities available in the ED at this time, and I doubt any other [facility] requiring further screening and/or treatment in the ED prior to admission is  present.       B Medical/Surgery History Past Medical History:  Diagnosis Date  . Allergy   . Anemia   . Anxiety   . Blood transfusion without reported diagnosis   . Cardiomyopathy ([place]) 11/2016   no ischemic eval due to CKD  . CHF (congestive heart failure) ([place])   . CKD (chronic kidney disease), stage IV ([place])   . Depression   . Diabetes mellitus without complication ([place])    type 2  . Dyspnea   . Hypertension   . Obesity    Past Surgical History:  Procedure Laterality Date  . ABDOMINAL HYSTERECTOMY    . BREAST SURGERY     reduction  . [place]  . LIPOMA EXCISION     back  Dr. Excell Seltzer 03-22-18  . LIPOMA EXCISION N/A 03/22/2018   Procedure: EXCISION OF BACK LIPOMA;  Surgeon: Excell Seltzer, MD;  Location: [facility];  Service: General;  Laterality: N/A;  . REDUCTION MAMMAPLASTY Bilateral      A IV  Location/Drains/Wounds Patient Lines/Drains/Airways Status   Active Line/Drains/Airways    Name:   Placement date:   Placement time:   Site:   Days:   Peripheral IV 06/16/19 Left Antecubital   06/16/19    1936    Antecubital   less than 1   Incision (Closed) 03/22/18 Back Right   03/22/18    1247     451          Intake/Output Last 24 hours No intake or output data in the 24 hours ending 06/16/19 2001  Labs/Imaging Results for orders placed or performed during the hospital encounter of 06/16/19 (from the past 48 hour(s))  Lipase, blood     Status: None   Collection Time: 06/16/19  6:43 PM  Result Value Ref Range   Lipase 32 11 - 51 U/L    Comment: Performed at [facility], [place] [address] [place], [place] [address]  Comprehensive metabolic panel     Status: Abnormal   Collection Time: 06/16/19  6:43 PM  Result Value Ref Range   Sodium 138 135 - 145 mmol/L   Potassium 3.2 (L) 3.5 - 5.1 mmol/L   Chloride 100 98 - 111 mmol/L   CO2 23 22 - 32 mmol/L   Glucose, Bld 123 (H) 70 - 99 mg/dL   BUN 27 (H) 6 - 20 mg/dL   Creatinine, Ser  7.36 (H) 0.44 - 1.00 mg/dL   Calcium 8.5 (L) 8.9 - 10.3 mg/dL   Total Protein 6.4 (L) 6.5 - 8.1 g/dL   Albumin 2.9 (L) 3.5 - 5.0 g/dL   AST 15 15 - 41 U/L   ALT 15 0 - 44 U/L   Alkaline Phosphatase 58 38 - 126 U/L   Total Bilirubin 0.7 0.3 - 1.2 mg/dL   GFR calc non Af Amer 5 (L) >60 mL/min   GFR calc Af Amer 6 (L) >60 mL/min   Anion gap 15 5 - 15    Comment: Performed at [facility] [address], [place], [place] [address]  CBC     Status: Abnormal   Collection Time: 06/16/19  6:43 PM  Result Value Ref Range   WBC 8.3 4.0 - 10.5 K/uL   RBC 2.96 (L) 3.87 - 5.11 MIL/uL   Hemoglobin 9.3 (L) 12.0 - 15.0 g/dL   HCT 28.4 (L) 36.0 - 46.0 %   MCV 95.9 80.0 - 100.0 fL   MCH 31.4 26.0 - 34.0 pg   MCHC 32.7 30.0 - 36.0 g/dL   RDW 14.8 11.5 - 15.5 %   Platelets 231 150 - 400 K/uL    Comment: REPEATED TO VERIFY   nRBC 0.0 0.0 - 0.2 %     Comment: Performed at [facility], [place] [address] [place], [place] [address]   Ct Abdomen Wo Contrast  Result Date: 06/16/2019 CLINICAL DATA:  Severe epigastric abdominal pain radiating into umbilical area and left side, recently treated for diverticulitis, peritoneal dialysis EXAM: CT ABDOMEN WITHOUT CONTRAST TECHNIQUE: Multidetector CT imaging of the abdomen was performed following the standard protocol without IV contrast. COMPARISON:  CT abdomen pelvis, 05/25/2019 FINDINGS: Lower chest: No acute abnormality. Hepatobiliary: No solid liver abnormality is seen. No gallstones, gallbladder wall thickening, or biliary dilatation. Pancreas: Unremarkable. No pancreatic ductal dilatation or surrounding inflammatory changes. Spleen: Normal in size without significant abnormality. Adrenals/Urinary Tract: Adrenal glands are unremarkable. Kidneys are normal, without renal calculi, solid lesion, or hydronephrosis. Bladder is unremarkable. Stomach/Bowel: Stomach is within normal limits. The sigmoid colon is incompletely imaged, the most proximal portion is included at the lower extent of the examination and appears thickened, with severe diverticulosis. Vascular/Lymphatic: Aortic atherosclerosis. No enlarged abdominal lymph nodes. Other: There is a small, fat, small bowel, and fluid containing umbilical hernia, which is incompletely imaged although contains a single, edematous appearing loop of mid small bowel without proximal evidence of obstruction. Measured portion of the hernia sac measures approximately 5.2 cm with a neck measuring 2.6 cm (series 2, image 53). There is a partially imaged peritoneal dialysis catheter in the soft tissues of the ventral abdomen, the intra-abdominal portion of the catheter is not imaged. Small volume ascites, in keeping with peritoneal dialysate. Musculoskeletal: No acute or significant osseous findings. IMPRESSION: 1. There is a small, fat, small bowel, and fluid containing  umbilical hernia, which is incompletely imaged although contains a single, edematous appearing loop of mid small bowel without proximal evidence of obstruction. Measured portion of the hernia sac measures approximately 5.2 cm with a neck measuring 2.6 cm (series 2, image 53). This has increased in size when compared to prior examination, with new involvement of the bowel, and findings are concerning for incarcerated or strangulated hernia. 2. The sigmoid colon is incompletely imaged, the most proximal portion is included at the lower extent of the examination and appears thickened, with severe diverticulosis. Findings are in keeping with  diverticulitis identified on prior examination although complete imaging of the sigmoid colon would be helpful to assess for complications of diverticulitis if suspected based on persistent symptoms. 3. Small volume ascites, in keeping with peritoneal dialysate. There is a partially imaged peritoneal dialysis catheter in the soft tissues of the ventral abdomen, the intra-abdominal portion of the catheter is not imaged. These results will be called to the ordering clinician or representative by the Radiologist Assistant, and communication documented in the PACS or zVision Dashboard. Electronically Signed   By: Eddie Candle M.D.   On: 06/16/2019 16:38    Pending Labs Unresulted Labs (From admission, onward)    Start     Ordered   06/16/19 1937  SARS Coronavirus 2 [facility] order, Performed in [facility] hospital lab) Nasopharyngeal Nasopharyngeal Swab  (Symptomatic/High Risk of Exposure/Tier 1 Patients Labs with Precautions)  Once,   STAT    Question Answer Comment  Is this test for diagnosis or screening Diagnosis of ill patient   Symptomatic for COVID-19 as defined by CDC Yes   Date of Symptom Onset 06/16/2019   Hospitalized for COVID-19 Yes   Admitted to ICU for COVID-19 No   Previously tested for COVID-19 No   Resident in a congregate (group) care setting No    Employed in healthcare setting No   Pregnant No      06/16/19 1937   06/16/19 1830  Urinalysis, Routine w reflex microscopic  ONCE - STAT,   STAT     06/16/19 1829          Vitals/Pain Today's Vitals   06/16/19 1943 06/16/19 1945 06/16/19 1952 06/16/19 1953  BP:  (!) 194/80    Pulse:  83    Resp: 20 18    Temp:      TempSrc:      SpO2:  100%    Weight:   71.6 kg   Height:   5\' 3"  (1.6 m)   PainSc: 10-Worst pain ever   5     Isolation Precautions No active isolations  Medications Medications  sodium chloride flush (NS) 0.9 % injection 3 mL (has no administration in time range)  morphine 4 MG/ML injection 6 mg (6 mg Intravenous Given 06/16/19 1937)  diphenhydrAMINE (BENADRYL) injection 12.5 mg (12.5 mg Intravenous Given 06/16/19 1937)    Mobility walks with device Low fall risk   Focused Assessments ABD Assesment   R Recommendations: See Admitting Provider Note  Report given to:   Additional Notes:

## 2019-06-16 NOTE — Transfer of Care (Signed)
Immediate Anesthesia Transfer of Care Note  Patient: Patricia [name]  Procedure(s) Performed: primary closure of umbilical hernia (N/A Abdomen)  Patient Location: PACU  Anesthesia Type:General  Level of Consciousness: sedated  Airway & Oxygen Therapy: Patient connected to nasal cannula oxygen  Post-op Assessment: Report given to RN and Post -op Vital signs reviewed and stable  Post vital signs: Reviewed and stable  Last Vitals:  Vitals Value Taken Time  BP 140/61 06/16/19 2257  Temp    Pulse 71 06/16/19 2259  Resp 20 06/16/19 2259  SpO2 100 % 06/16/19 2259  Vitals shown include unvalidated device data.  Last Pain:  Vitals:   06/16/19 2019  TempSrc:   PainSc: 5          Complications: No apparent anesthesia complications

## 2019-06-16 NOTE — Consult Note (Signed)
Reason for Consult: Incarcerated umbilical hernia Referring Physician: Dr. Lionel December Patricia [name] is an 63 y.o. female.  HPI: Patient is a 63 year old female with a history of CHF, chronic kidney disease on peritoneal dialysis, diabetes, hypertension, obesity.  Patient comes in today after 7 to 10-day history of abdominal pain.  Patient states that she was having some nausea and vomiting approximately 5 to 6 days ago.  She states that this would occur with breakfast.  Patient called into her GI physician and was sent for CT scan.  CT scan was shown to have a incarcerated small bowel with an umbilical hernia.  Patient was sent to the ER thereafter.  Upon evaluation the ER and in speaking with her daughter patient's had previous PD catheter placed at an outside hospital and had the umbilical hernia repaired at that time.  According to op note on December 20, 2018 patient had lap peritoneal dialysis catheter placement and primary umbilical hernia repair.  Patient states the pain was very severe this morning after breakfast.  Patient states this was followed with nausea and vomiting throughout the day.  Patient had 2 previous C-sections, PD catheter placed.  Past Medical History:  Diagnosis Date   Allergy    Anemia    Anxiety    Blood transfusion without reported diagnosis    Cardiomyopathy ([place]) 11/2016   no ischemic eval due to CKD   CHF (congestive heart failure) (HCC)    CKD (chronic kidney disease), stage IV (HCC)    Depression    Diabetes mellitus without complication ([place])    type 2   Dyspnea    Hypertension    Obesity     Past Surgical History:  Procedure Laterality Date   ABDOMINAL HYSTERECTOMY     BREAST SURGERY     reduction   CESAREAN SECTION  1982, 1988   LIPOMA EXCISION     back  Dr. Excell Seltzer 03-22-18   LIPOMA EXCISION N/A 03/22/2018   Procedure: EXCISION OF BACK LIPOMA;  Surgeon: Excell Seltzer, MD;  Location: [facility];  Service: General;  Laterality: N/A;    REDUCTION MAMMAPLASTY Bilateral     Family History  Problem Relation Age of Onset   Diabetes Brother    Stomach cancer Brother    Kidney disease Brother    Heart Problems Maternal Grandmother        pacemaker   Heart disease Maternal Grandfather    Rectal cancer Neg Hx    Esophageal cancer Neg Hx    Liver cancer Neg Hx    Colon cancer Neg Hx     Social History:  reports that she has never smoked. She has never used smokeless tobacco. She reports that she does not drink alcohol or use drugs.  Allergies:  Allergies  Allergen Reactions   Codone [Hydrocodone] Itching   Norvasc [Amlodipine Besylate] Swelling    Lower extremity?   Sulfa Antibiotics Itching and Rash    Medications: I have reviewed the patient's current medications.  Results for orders placed or performed during the hospital encounter of 06/16/19 (from the past 48 hour(s))  Lipase, blood     Status: None   Collection Time: 06/16/19  6:43 PM  Result Value Ref Range   Lipase 32 11 - 51 U/L    Comment: Performed at [facility], [place] [address] [place], [place] [address]  Comprehensive metabolic panel     Status: Abnormal   Collection Time: 06/16/19  6:43 PM  Result Value Ref  Range   Sodium 138 135 - 145 mmol/L   Potassium 3.2 (L) 3.5 - 5.1 mmol/L   Chloride 100 98 - 111 mmol/L   CO2 23 22 - 32 mmol/L   Glucose, Bld 123 (H) 70 - 99 mg/dL   BUN 27 (H) 6 - 20 mg/dL   Creatinine, Ser 7.36 (H) 0.44 - 1.00 mg/dL   Calcium 8.5 (L) 8.9 - 10.3 mg/dL   Total Protein 6.4 (L) 6.5 - 8.1 g/dL   Albumin 2.9 (L) 3.5 - 5.0 g/dL   AST 15 15 - 41 U/L   ALT 15 0 - 44 U/L   Alkaline Phosphatase 58 38 - 126 U/L   Total Bilirubin 0.7 0.3 - 1.2 mg/dL   GFR calc non Af Amer 5 (L) >60 mL/min   GFR calc Af Amer 6 (L) >60 mL/min   Anion gap 15 5 - 15    Comment: Performed at [facility], [place] [address], [place], [place] [address]  CBC     Status: Abnormal   Collection Time: 06/16/19  6:43 PM  Result  Value Ref Range   WBC 8.3 4.0 - 10.5 K/uL   RBC 2.96 (L) 3.87 - 5.11 MIL/uL   Hemoglobin 9.3 (L) 12.0 - 15.0 g/dL   HCT 28.4 (L) 36.0 - 46.0 %   MCV 95.9 80.0 - 100.0 fL   MCH 31.4 26.0 - 34.0 pg   MCHC 32.7 30.0 - 36.0 g/dL   RDW 14.8 11.5 - 15.5 %   Platelets 231 150 - 400 K/uL    Comment: REPEATED TO VERIFY   nRBC 0.0 0.0 - 0.2 %    Comment: Performed at [facility], [place] [address], [place], [place] [address]    Ct Abdomen Wo Contrast  Result Date: 06/16/2019 CLINICAL DATA:  Severe epigastric abdominal pain radiating into umbilical area and left side, recently treated for diverticulitis, peritoneal dialysis EXAM: CT ABDOMEN WITHOUT CONTRAST TECHNIQUE: Multidetector CT imaging of the abdomen was performed following the standard protocol without IV contrast. COMPARISON:  CT abdomen pelvis, 05/25/2019 FINDINGS: Lower chest: No acute abnormality. Hepatobiliary: No solid liver abnormality is seen. No gallstones, gallbladder wall thickening, or biliary dilatation. Pancreas: Unremarkable. No pancreatic ductal dilatation or surrounding inflammatory changes. Spleen: Normal in size without significant abnormality. Adrenals/Urinary Tract: Adrenal glands are unremarkable. Kidneys are normal, without renal calculi, solid lesion, or hydronephrosis. Bladder is unremarkable. Stomach/Bowel: Stomach is within normal limits. The sigmoid colon is incompletely imaged, the most proximal portion is included at the lower extent of the examination and appears thickened, with severe diverticulosis. Vascular/Lymphatic: Aortic atherosclerosis. No enlarged abdominal lymph nodes. Other: There is a small, fat, small bowel, and fluid containing umbilical hernia, which is incompletely imaged although contains a single, edematous appearing loop of mid small bowel without proximal evidence of obstruction. Measured portion of the hernia sac measures approximately 5.2 cm with a neck measuring 2.6 cm (series 2, image 53). There  is a partially imaged peritoneal dialysis catheter in the soft tissues of the ventral abdomen, the intra-abdominal portion of the catheter is not imaged. Small volume ascites, in keeping with peritoneal dialysate. Musculoskeletal: No acute or significant osseous findings. IMPRESSION: 1. There is a small, fat, small bowel, and fluid containing umbilical hernia, which is incompletely imaged although contains a single, edematous appearing loop of mid small bowel without proximal evidence of obstruction. Measured portion of the hernia sac measures approximately 5.2 cm with a neck measuring 2.6 cm (series 2,  image 53). This has increased in size when compared to prior examination, with new involvement of the bowel, and findings are concerning for incarcerated or strangulated hernia. 2. The sigmoid colon is incompletely imaged, the most proximal portion is included at the lower extent of the examination and appears thickened, with severe diverticulosis. Findings are in keeping with diverticulitis identified on prior examination although complete imaging of the sigmoid colon would be helpful to assess for complications of diverticulitis if suspected based on persistent symptoms. 3. Small volume ascites, in keeping with peritoneal dialysate. There is a partially imaged peritoneal dialysis catheter in the soft tissues of the ventral abdomen, the intra-abdominal portion of the catheter is not imaged. These results will be called to the ordering clinician or representative by the Radiologist Assistant, and communication documented in the PACS or zVision Dashboard. Electronically Signed   By: Eddie Candle M.D.   On: 06/16/2019 16:38    Review of Systems  Constitutional: Negative for chills, fever and malaise/fatigue.  HENT: Negative for ear discharge, hearing loss and sore throat.   Eyes: Negative for blurred vision and discharge.  Respiratory: Negative for cough and shortness of breath.   Cardiovascular: Negative for  chest pain, orthopnea and leg swelling.  Gastrointestinal: Positive for abdominal pain, nausea and vomiting. Negative for constipation, diarrhea and heartburn.  Musculoskeletal: Negative for myalgias and neck pain.  Skin: Negative for itching and rash.  Neurological: Negative for dizziness, focal weakness, seizures and loss of consciousness.  Endo/Heme/Allergies: Negative for environmental allergies. Does not bruise/bleed easily.  Psychiatric/Behavioral: Negative for depression and suicidal ideas.  All other systems reviewed and are negative.  Blood pressure (!) 194/80, pulse 83, temperature 98.5 F (36.9 C), temperature source Oral, resp. rate 18, height 5\' 3"  (1.6 m), weight 71.6 kg, SpO2 100 %. Physical Exam  Constitutional: She is oriented to person, place, and time. Vital signs are normal. She appears well-developed and well-nourished.  Conversant No acute distress  Eyes: Lids are normal. No scleral icterus.  No lid lag Moist conjunctiva  Neck: No tracheal tenderness present. No thyromegaly present.  No cervical lymphadenopathy  Cardiovascular: Normal rate, regular rhythm and intact distal pulses.  No murmur heard. Respiratory: Effort normal and breath sounds normal. She has no wheezes. She has no rales.  GI: There is no hepatosplenomegaly. There is abdominal tenderness. There is guarding. There is no rigidity. A hernia is present. Ventral: at umbilicus, tenderness to palpation.    Neurological: She is alert and oriented to person, place, and time.  Normal gait and station  Skin: Skin is warm. No rash noted. No cyanosis. Nails show no clubbing.  Normal skin turgor  Psychiatric: Judgment normal.  Appropriate affect    Assessment/Plan: 63 year old female with a incarcerated umbilical hernia Per the CT scan there appears to be a section of small bowel within the umbilical hernia.  This did appear to be reduced in the ER with pain resolution. Patient Active Problem List    Diagnosis Date Noted   End stage renal disease on dialysis ([place]) 01/20/2019   CHF (congestive heart failure) ([place]) 12/06/2018   Fluid overload 12/05/2018   Type II diabetes mellitus with renal manifestations ([place]) 12/05/2018   Hypoglycemia 12/05/2018   Acute on chronic combined systolic and diastolic CHF (congestive heart failure) ([place]) 06/14/2018   CKD (chronic kidney disease) stage 5, GFR less than 15 ml/min (HCC) 06/14/2018   Anemia due to stage 5 chronic kidney disease, not on chronic dialysis ([place]) 16/07/9603   Systolic  CHF ([place]) 11/29/2016   Diabetes mellitus type 2 with complications ([place]) 70/48/8891   Transaminasemia 11/29/2016   Cardiomyopathy ([place]) 11/12/2016   Lipoma 10/21/2016   CKD (chronic kidney disease) stage 4, GFR 15-29 ml/min (HCC) 08/10/2016   Anxiety and depression 12/12/2015   Diabetic nephropathy associated with type 2 diabetes mellitus ([place]) 11/15/2014   Moderate nonproliferative diabetic retinopathy(362.05) 04/17/2014   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  We will proceed to the operating for diagnostic laparoscopy and evaluation of small bowel, possible bowel resection 2.  Likely primary repair of umbilical hernia. 3.  Recommend medical admission secondary patient's multiple medical issues.  If no bowel resection is required patient may be able to be discharged on postop day 1.  Ralene Ok 06/16/2019, 8:14 PM

## 2019-06-16 NOTE — Anesthesia Procedure Notes (Signed)
Procedure Name: Intubation Date/Time: 06/16/2019 10:09 PM Performed by: Valetta Fuller, CRNA Pre-anesthesia Checklist: Patient identified, Suction available, Emergency Drugs available and Patient being monitored Patient Re-evaluated:Patient Re-evaluated prior to induction Oxygen Delivery Method: Circle system utilized Preoxygenation: Pre-oxygenation with 100% oxygen Induction Type: IV induction, Rapid sequence and Cricoid Pressure applied Laryngoscope Size: Miller and 2 Grade View: Grade I Tube type: Oral Tube size: 7.5 mm Number of attempts: 1 Airway Equipment and Method: Stylet Placement Confirmation: ETT inserted through vocal cords under direct vision,  positive ETCO2 and breath sounds checked- equal and bilateral Secured at: 21 cm Tube secured with: Tape Dental Injury: Teeth and Oropharynx as per pre-operative assessment

## 2019-06-16 NOTE — Progress Notes (Signed)
Pharmacy Antibiotic Note  Patricia [name] is a 63 y.o. female admitted on 06/16/2019 with intra-abdominal infection.  Pharmacy has been consulted for Zosyn dosing to continue post-op. Noted hx of ESRD on PD.  Cefotetan 2g IV x 1 for surgical prophylaxis given at 2154.  Plan: Zosyn 2.25g IV q8h (dosed for PD) to start tomorrow Monitor clinical progress, c/s, abx plan/LOT Pre-HD vancomycin level as indicated F/u Renal plans inpatient   Height: 5\' 3"  (160 cm) Weight: 157 lb 13.6 oz (71.6 kg) IBW/kg (Calculated) : 52.4  Temp (24hrs), Avg:98.1 F (36.7 C), Min:97.6 F (36.4 C), Max:98.5 F (36.9 C)  Recent Labs  Lab 06/16/19 1843  WBC 8.3  CREATININE 7.36*    Estimated Creatinine Clearance: 7.7 mL/min (A) (by C-G formula based on SCr of 7.36 mg/dL (H)).    Allergies  Allergen Reactions  . Codone [Hydrocodone] Itching  . Norvasc [Amlodipine Besylate] Swelling    Lower extremity?  Ignacia Bayley Antibiotics Itching and Rash    Elicia Lamp, PharmD, BCPS Please check AMION for all [place] contact numbers Clinical Pharmacist 06/16/2019 9:31 PM

## 2019-06-16 NOTE — Telephone Encounter (Signed)
Patient had a CT of the abdomen today. The radiologist call the office with a critical report of Strangulated small bowel. Ellouise Newer PA was notified and ordered patient to go directly to [facility] ER. Patient was notified, I spoke with her daughter who was informed of the findings on the report. She stated she will take patient to [facility] ER for evaluation today.

## 2019-06-17 ENCOUNTER — Encounter (HOSPITAL_COMMUNITY): Payer: Self-pay | Admitting: General Surgery

## 2019-06-17 LAB — CBC
HCT: 28.1 % — ABNORMAL LOW (ref 36.0–46.0)
Hemoglobin: 9 g/dL — ABNORMAL LOW (ref 12.0–15.0)
MCH: 31.3 pg (ref 26.0–34.0)
MCHC: 32 g/dL (ref 30.0–36.0)
MCV: 97.6 fL (ref 80.0–100.0)
Platelets: 193 10*3/uL (ref 150–400)
RBC: 2.88 MIL/uL — ABNORMAL LOW (ref 3.87–5.11)
RDW: 14.8 % (ref 11.5–15.5)
WBC: 8.2 10*3/uL (ref 4.0–10.5)
nRBC: 0 % (ref 0.0–0.2)

## 2019-06-17 LAB — GLUCOSE, CAPILLARY
Glucose-Capillary: 191 mg/dL — ABNORMAL HIGH (ref 70–99)
Glucose-Capillary: 194 mg/dL — ABNORMAL HIGH (ref 70–99)
Glucose-Capillary: 259 mg/dL — ABNORMAL HIGH (ref 70–99)
Glucose-Capillary: 255 mg/dL — ABNORMAL HIGH (ref 70–99)
Glucose-Capillary: 338 mg/dL — ABNORMAL HIGH (ref 70–99)

## 2019-06-17 LAB — BASIC METABOLIC PANEL
Anion gap: 14 (ref 5–15)
BUN: 30 mg/dL — ABNORMAL HIGH (ref 6–20)
CO2: 23 mmol/L (ref 22–32)
Calcium: 8.4 mg/dL — ABNORMAL LOW (ref 8.9–10.3)
Chloride: 101 mmol/L (ref 98–111)
Creatinine, Ser: 7.28 mg/dL — ABNORMAL HIGH (ref 0.44–1.00)
GFR calc Af Amer: 6 mL/min — ABNORMAL LOW (ref >60)
GFR calc non Af Amer: 6 mL/min — ABNORMAL LOW (ref >60)
Glucose, Bld: 187 mg/dL — ABNORMAL HIGH (ref 70–99)
Potassium: 4 mmol/L (ref 3.5–5.1)
Sodium: 138 mmol/L (ref 135–145)

## 2019-06-17 MED ORDER — ISOSORBIDE MONONITRATE ER 30 MG PO TB24
15.0000 mg | ORAL_TABLET | Freq: Every day | ORAL | Status: DC
  Administered 2019-06-17 – 2019-06-20 (×4): 15 mg via ORAL
  Filled 2019-06-17 (×4): qty 1

## 2019-06-17 MED ORDER — OXYCODONE HCL 5 MG PO TABS
5.0000 mg | ORAL_TABLET | ORAL | Status: DC | PRN
  Administered 2019-06-17 – 2019-06-20 (×3): 5 mg via ORAL
  Filled 2019-06-17 (×3): qty 1

## 2019-06-17 MED ORDER — TRAMADOL HCL 50 MG PO TABS
50.0000 mg | ORAL_TABLET | Freq: Two times a day (BID) | ORAL | Status: DC | PRN
  Administered 2019-06-17 – 2019-06-20 (×5): 50 mg via ORAL
  Filled 2019-06-17 (×5): qty 1

## 2019-06-17 MED ORDER — ENOXAPARIN SODIUM 30 MG/0.3ML ~~LOC~~ SOLN
30.0000 mg | SUBCUTANEOUS | Status: DC
  Administered 2019-06-17 – 2019-06-20 (×4): 30 mg via SUBCUTANEOUS
  Filled 2019-06-17 (×4): qty 0.3

## 2019-06-17 MED ORDER — HEPARIN 1000 UNIT/ML FOR PERITONEAL DIALYSIS: 500.0000 [IU] | INTRAMUSCULAR | Status: DC | PRN

## 2019-06-17 MED ORDER — ONDANSETRON HCL 4 MG/2ML IJ SOLN: 4.0000 mg | Freq: Four times a day (QID) | INTRAMUSCULAR | Status: DC | PRN

## 2019-06-17 MED ORDER — FUROSEMIDE 80 MG PO TABS
80.0000 mg | ORAL_TABLET | Freq: Every day | ORAL | Status: DC
  Administered 2019-06-17 – 2019-06-20 (×4): 80 mg via ORAL
  Filled 2019-06-17 (×4): qty 1

## 2019-06-17 MED ORDER — HYDRALAZINE HCL 50 MG PO TABS
50.0000 mg | ORAL_TABLET | Freq: Two times a day (BID) | ORAL | Status: DC
  Administered 2019-06-17 (×2): 50 mg via ORAL
  Filled 2019-06-17 (×2): qty 1

## 2019-06-17 MED ORDER — ESCITALOPRAM OXALATE 20 MG PO TABS
20.0000 mg | ORAL_TABLET | Freq: Every day | ORAL | Status: DC
  Administered 2019-06-17 – 2019-06-19 (×3): 20 mg via ORAL
  Filled 2019-06-17 (×3): qty 1

## 2019-06-17 MED ORDER — INSULIN ASPART 100 UNIT/ML ~~LOC~~ SOLN
0.0000 [IU] | Freq: Three times a day (TID) | SUBCUTANEOUS | Status: DC
  Administered 2019-06-18: 5 [IU] via SUBCUTANEOUS
  Administered 2019-06-18: 3 [IU] via SUBCUTANEOUS
  Administered 2019-06-18: 2 [IU] via SUBCUTANEOUS
  Administered 2019-06-19: 1 [IU] via SUBCUTANEOUS
  Administered 2019-06-19: 2 [IU] via SUBCUTANEOUS

## 2019-06-17 MED ORDER — GENTAMICIN SULFATE 0.1 % EX CREA
1.0000 "application " | TOPICAL_CREAM | Freq: Every day | CUTANEOUS | Status: DC
  Administered 2019-06-18: 1 via TOPICAL
  Filled 2019-06-17: qty 15

## 2019-06-17 MED ORDER — ONDANSETRON 4 MG PO TBDP: 4.0000 mg | ORAL_TABLET | Freq: Four times a day (QID) | ORAL | Status: DC | PRN

## 2019-06-17 MED ORDER — SEVELAMER CARBONATE 800 MG PO TABS
1600.0000 mg | ORAL_TABLET | Freq: Three times a day (TID) | ORAL | Status: DC
  Administered 2019-06-17 – 2019-06-20 (×9): 1600 mg via ORAL
  Filled 2019-06-17 (×8): qty 2

## 2019-06-17 MED ORDER — CARVEDILOL 25 MG PO TABS
25.0000 mg | ORAL_TABLET | Freq: Two times a day (BID) | ORAL | Status: DC
  Administered 2019-06-17 – 2019-06-20 (×7): 25 mg via ORAL
  Filled 2019-06-17 (×6): qty 1

## 2019-06-17 MED ORDER — ATORVASTATIN CALCIUM 10 MG PO TABS
10.0000 mg | ORAL_TABLET | Freq: Every day | ORAL | Status: DC
  Administered 2019-06-17 – 2019-06-20 (×4): 10 mg via ORAL
  Filled 2019-06-17 (×4): qty 1

## 2019-06-17 MED ORDER — POTASSIUM CHLORIDE 10 MEQ/100ML IV SOLN
10.0000 meq | INTRAVENOUS | Status: AC
  Administered 2019-06-17 (×3): 10 meq via INTRAVENOUS
  Filled 2019-06-17 (×3): qty 100

## 2019-06-17 NOTE — Progress Notes (Signed)
Received patient from PACU. Patient A&O x3. Very sleepy. Patient placed on telemetry. Patient oriented to room and equipment

## 2019-06-17 NOTE — Plan of Care (Signed)
Poc progressing.  

## 2019-06-17 NOTE — Progress Notes (Signed)
PROGRESS NOTE    Patricia [name]  EYE:[id number] DOB: August 11, 1956 DOA: 06/16/2019 PCP: Ladell Pier, MD    Brief Narrative:   Patricia [name] is a 63 y.o. female with medical history significant of anemia, cardiomyopathy, CHF, ESRD on peritoneal dialysis, insulin-dependent type 2 diabetes, hypertension, obesity, cesarean section, hysterectomy presenting to the hospital for evaluation of abdominal pain, nausea, and vomiting.  Patient was seen by gastroenterology today and had an outpatient CT scan performed which showed a strangulated hernia.  History provided by patient and daughter at bedside.  Patient states she was seen in the emergency room 3 weeks ago for abdominal pain and told she had diverticulitis.  She was treated with a course of antibiotics.  She has continued to have severe abdominal pain which is located in the periumbilical region.  Also having nonbloody nonbilious emesis.  Having regular soft bowel movements.  Denies diarrhea, blood in stool, or constipation.  Denies fevers or chills.  She has not noticed any change in the color of the fluid that is removed via peritoneal dialysis.  She still makes urine.  Denies dysuria, urinary frequency, or urgency.  Daughter states that patient has had previous umbilical hernia repair surgery.  Patient she did not take her blood pressure medications today.  No other complaints.  ED Course: Blood pressure elevated, highest recorded reading 210/187.  Afebrile and remainder of vital signs stable.  Labs showing no leukocytosis.  Hemoglobin 9.3, at baseline.  Potassium 3.2.  Lipase and LFTs normal.  Patient received morphine and Benadryl in the ED.  CT abdomen pelvis done earlier today with findings concerning for incarcerated or strangulated umbilical hernia.  ED provider discussed the case with Dr. Rosendo Gros from general surgery who has seen the patient and requesting admission by hospitalist service.  He plans on taking her to the OR tonight, pending  SARS-CoV-2 rapid test result.   Assessment & Plan:   Principal Problem:   Strangulated umbilical hernia Active Problems:   CHF (congestive heart failure) (HCC)   ESRD (end stage renal disease) (HCC)   Hypokalemia   Hypertensive urgency   Umbilical hernia   Strangulated/incarcerated umbilical hernia Patient presenting with severe abdominal pain localized in the periumbilical region nauseated with nausea and vomiting.  CT abdomen/pelvis notable for findings concerning for incarcerated versus strangulated umbilical hernia.  Patient underwent diagnostic laparoscopy on 06/16/2019 by Dr. Rosendo Gros with primary closure of the umbilical hernia. --Tolerating diet, now advanced --Continue pain control with prn Tramadol/oxy per General surgery --DC Zosyn today --Continue monitor serial abdominal exams, bowel movements  Hypokalemia Potassium 3.2 on admission, likely secondary to GI losses from vomiting and decreased oral intake.  Repleted. --Potassium 4.0 this morning. --Continue to monitor electrolytes daily  Hypertensive urgency Blood pressures remain sustained in the upper 180s. --Resume home Coreg 25 mg p.o. twice daily, Imdur 50 mg p.o. daily, and Lasix 80 mg p.o. daily  ESRD on peritoneal dialysis No signs of volume overload. --Consulted nephrology to continue PD while inpatient; they will discuss with general surgery whether need to transition to HD versus continue PD.  Insulin-dependent type 2 diabetes mellitus On Lantus 24 units qHS and Novolog 3 units TIDAC at home.  --Holding home regimen for now given recent surgical intervention and now just restarting diet --Continue NovoLog insulin sliding scale for coverage --CBGs q. AC/at bedtime  Chronic combined systolic and diastolic congestive heart failure Last echo done February 2020 with left ventricular EF 40 to 97% and diastolic parameters consistent with  impaired relaxation. --Continue volume management with PD versus need to  transition to HD per nephrology --Resume home Coreg and Lasix 80 mg p.o. daily --Strict I's and O's and daily weights    DVT prophylaxis: SCDs Code Status: Full Code Family Communication: None Disposition Plan: Continue inpatient hospitalization, further depending on clinical course, anticipate discharge home in 1-2 days   Consultants:   General surgery  Nephrology  Procedures:   Primary closure of umbilical hernia, diagnostic laparoscopy 06/16/2019 - Dr. Rosendo Gros  Antimicrobials:   Preoperative cefazolin  Zosyn 9/4 - 9/5   Subjective: Patient seen and examined at bedside, resting comfortably.  States abdominal pain is remarkably improved.  Currently tolerating diet without any significant nausea or vomiting.  No other complaints or concerns at this time.  Denies headache, no fever/chills/night sweats, no nausea some vomiting/diarrhea, no chest pain, palpitations, no shortness of breath, no cough/congestion, no abdominal pain, no weakness, no paresthesias.  No acute events overnight per nursing staff.  Objective: Vitals:   06/17/19 0755 06/17/19 0819 06/17/19 0855 06/17/19 1118  BP: (!) 178/83 (!) 168/73 (!) 157/69 (!) 154/48  Pulse: 79 83 83 79  Resp: 12 18 14 17   Temp: 97.8 F (36.6 C) 98 F (36.7 C)  98 F (36.7 C)  TempSrc:  Oral  Oral  SpO2: 100% 98% 97% 98%  Weight:      Height:        Intake/Output Summary (Last 24 hours) at 06/17/2019 1135 Last data filed at 06/17/2019 0900 Gross per 24 hour  Intake 440 ml  Output --  Net 440 ml   Filed Weights   06/16/19 1952 06/16/19 2353  Weight: 71.6 kg 70.6 kg    Examination:  General exam: Appears calm and comfortable  Respiratory system: Clear to auscultation. Respiratory effort normal. Cardiovascular system: S1 & S2 heard, RRR. No JVD, murmurs, rubs, gallops or clicks. No pedal edema. Gastrointestinal system: Abdomen is nondistended, soft and nontender. No organomegaly or masses felt. Normal bowel sounds  heard.  PD catheter noted in place, surgical incision sites noted, clean/dry/intact Central nervous system: Alert and oriented. No focal neurological deficits. Extremities: Symmetric 5 x 5 power. Skin: No rashes, lesions or ulcers Psychiatry: Judgement and insight appear normal. Mood & affect appropriate.     Data Reviewed: I have personally reviewed following labs and imaging studies  CBC: Recent Labs  Lab 06/16/19 1843 06/17/19 0344  WBC 8.3 8.2  HGB 9.3* 9.0*  HCT 28.4* 28.1*  MCV 95.9 97.6  PLT 231 790   Basic Metabolic Panel: Recent Labs  Lab 06/16/19 1843 06/16/19 2251 06/17/19 0344  NA 138  --  138  K 3.2*  --  4.0  CL 100  --  101  CO2 23  --  23  GLUCOSE 123*  --  187*  BUN 27*  --  30*  CREATININE 7.36*  --  7.28*  CALCIUM 8.5*  --  8.4*  MG  --  2.0  --    GFR: Estimated Creatinine Clearance: 7.7 mL/min (A) (by C-G formula based on SCr of 7.28 mg/dL (H)). Liver Function Tests: Recent Labs  Lab 06/16/19 1843  AST 15  ALT 15  ALKPHOS 58  BILITOT 0.7  PROT 6.4*  ALBUMIN 2.9*   Recent Labs  Lab 06/16/19 1843  LIPASE 32   No results for input(s): AMMONIA in the last 168 hours. Coagulation Profile: No results for input(s): INR, PROTIME in the last 168 hours. Cardiac Enzymes: No results for  input(s): CKTOTAL, CKMB, CKMBINDEX, TROPONINI in the last 168 hours. BNP (last 3 results) No results for input(s): PROBNP in the last 8760 hours. HbA1C: Recent Labs    06/16/19 2120  HGBA1C 7.0*   CBG: Recent Labs  Lab 06/16/19 2301 06/17/19 0439 06/17/19 0819  GLUCAP 100* 191* 194*   Lipid Profile: No results for input(s): CHOL, HDL, LDLCALC, TRIG, CHOLHDL, LDLDIRECT in the last 72 hours. Thyroid Function Tests: No results for input(s): TSH, T4TOTAL, FREET4, T3FREE, THYROIDAB in the last 72 hours. Anemia Panel: No results for input(s): VITAMINB12, FOLATE, FERRITIN, TIBC, IRON, RETICCTPCT in the last 72 hours. Sepsis Labs: Recent Labs  Lab  06/16/19 2107  LATICACIDVEN 0.8    Recent Results (from the past 240 hour(s))  SARS Coronavirus 2 [facility] order, Performed in [facility] hospital lab) Nasopharyngeal Nasopharyngeal Swab     Status: None   Collection Time: 06/16/19  7:50 PM   Specimen: Nasopharyngeal Swab  Result Value Ref Range Status   SARS Coronavirus 2 NEGATIVE NEGATIVE Final    Comment: (NOTE) If result is NEGATIVE SARS-CoV-2 target nucleic acids are NOT DETECTED. The SARS-CoV-2 RNA is generally detectable in upper and lower  respiratory specimens during the acute phase of infection. The lowest  concentration of SARS-CoV-2 viral copies this assay can detect is 250  copies / mL. A negative result does not preclude SARS-CoV-2 infection  and should not be used as the sole basis for treatment or other  patient management decisions.  A negative result may occur with  improper specimen collection / handling, submission of specimen other  than nasopharyngeal swab, presence of viral mutation(s) within the  areas targeted by this assay, and inadequate number of viral copies  (<250 copies / mL). A negative result must be combined with clinical  observations, patient history, and epidemiological information. If result is POSITIVE SARS-CoV-2 target nucleic acids are DETECTED. The SARS-CoV-2 RNA is generally detectable in upper and lower  respiratory specimens dur ing the acute phase of infection.  Positive  results are indicative of active infection with SARS-CoV-2.  Clinical  correlation with patient history and other diagnostic information is  necessary to determine patient infection status.  Positive results do  not rule out bacterial infection or co-infection with other viruses. If result is PRESUMPTIVE POSTIVE SARS-CoV-2 nucleic acids MAY BE PRESENT.   A presumptive positive result was obtained on the submitted specimen  and confirmed on repeat testing.  While 2019 novel coronavirus  (SARS-CoV-2) nucleic acids  may be present in the submitted sample  additional confirmatory testing may be necessary for epidemiological  and / or clinical management purposes  to differentiate between  SARS-CoV-2 and other Sarbecovirus currently known to infect humans.  If clinically indicated additional testing with an alternate test  methodology [phone]) is advised. The SARS-CoV-2 RNA is generally  detectable in upper and lower respiratory sp ecimens during the acute  phase of infection. The expected result is Negative. Fact Sheet for Patients:  StrictlyIdeas.no Fact Sheet for Healthcare Providers: BankingDealers.co.za This test is not yet approved or cleared by the [place] FDA and has been authorized for detection and/or diagnosis of SARS-CoV-2 by FDA under an Emergency Use Authorization (EUA).  This EUA will remain in effect (meaning this test can be used) for the duration of the COVID-19 declaration under Section 564(b)(1) of the Act, 21 U.S.C. section 360bbb-3(b)(1), unless the authorization is terminated or revoked sooner. Performed at [facility], [place] [address], [place], [place] [address]  Radiology Studies: Ct Abdomen Wo Contrast  Result Date: 06/16/2019 CLINICAL DATA:  Severe epigastric abdominal pain radiating into umbilical area and left side, recently treated for diverticulitis, peritoneal dialysis EXAM: CT ABDOMEN WITHOUT CONTRAST TECHNIQUE: Multidetector CT imaging of the abdomen was performed following the standard protocol without IV contrast. COMPARISON:  CT abdomen pelvis, 05/25/2019 FINDINGS: Lower chest: No acute abnormality. Hepatobiliary: No solid liver abnormality is seen. No gallstones, gallbladder wall thickening, or biliary dilatation. Pancreas: Unremarkable. No pancreatic ductal dilatation or surrounding inflammatory changes. Spleen: Normal in size without significant abnormality. Adrenals/Urinary Tract: Adrenal  glands are unremarkable. Kidneys are normal, without renal calculi, solid lesion, or hydronephrosis. Bladder is unremarkable. Stomach/Bowel: Stomach is within normal limits. The sigmoid colon is incompletely imaged, the most proximal portion is included at the lower extent of the examination and appears thickened, with severe diverticulosis. Vascular/Lymphatic: Aortic atherosclerosis. No enlarged abdominal lymph nodes. Other: There is a small, fat, small bowel, and fluid containing umbilical hernia, which is incompletely imaged although contains a single, edematous appearing loop of mid small bowel without proximal evidence of obstruction. Measured portion of the hernia sac measures approximately 5.2 cm with a neck measuring 2.6 cm (series 2, image 53). There is a partially imaged peritoneal dialysis catheter in the soft tissues of the ventral abdomen, the intra-abdominal portion of the catheter is not imaged. Small volume ascites, in keeping with peritoneal dialysate. Musculoskeletal: No acute or significant osseous findings. IMPRESSION: 1. There is a small, fat, small bowel, and fluid containing umbilical hernia, which is incompletely imaged although contains a single, edematous appearing loop of mid small bowel without proximal evidence of obstruction. Measured portion of the hernia sac measures approximately 5.2 cm with a neck measuring 2.6 cm (series 2, image 53). This has increased in size when compared to prior examination, with new involvement of the bowel, and findings are concerning for incarcerated or strangulated hernia. 2. The sigmoid colon is incompletely imaged, the most proximal portion is included at the lower extent of the examination and appears thickened, with severe diverticulosis. Findings are in keeping with diverticulitis identified on prior examination although complete imaging of the sigmoid colon would be helpful to assess for complications of diverticulitis if suspected based on  persistent symptoms. 3. Small volume ascites, in keeping with peritoneal dialysate. There is a partially imaged peritoneal dialysis catheter in the soft tissues of the ventral abdomen, the intra-abdominal portion of the catheter is not imaged. These results will be called to the ordering clinician or representative by the Radiologist Assistant, and communication documented in the PACS or zVision Dashboard. Electronically Signed   By: Eddie Candle M.D.   On: 06/16/2019 16:38        Scheduled Meds:  atorvastatin  10 mg Oral Daily   carvedilol  25 mg Oral BID WC   enoxaparin (LOVENOX) injection  30 mg Subcutaneous Q24H   escitalopram  20 mg Oral QHS   hydrALAZINE  50 mg Oral BID   insulin aspart  0-9 Units Subcutaneous Q4H   isosorbide mononitrate  15 mg Oral Daily   sodium chloride flush  3 mL Intravenous Once   Continuous Infusions:  piperacillin-tazobactam (ZOSYN)  IV       LOS: 1 day    Time spent: 36 minutes spent on chart review, discussion with nursing staff, consultants, updating family and interview/physical exam; more than 50% of that time was spent in counseling and/or coordination of care.    Maciah Feeback J [place] DO [facility] Pager [phone]  If 7PM-7AM, please contact night-coverage [email or web address] Password [email or web address] 06/17/2019, 11:35 AM

## 2019-06-17 NOTE — Consult Note (Signed)
Renal Service Consult Note [facility] Patricia [name] 06/17/2019 Sol Blazing Requesting Physician:  Dr [place] E.   Reason for Consult:  ESRD pt on PD w/ incarc hernia HPI: The patient is a 63 y.o. year-old /w hx of DM2, anemia, CM/ CHF, and ESRD on CCPD presented to hopsital last night w/ abd pain and N/V.  Seen in OP setting by GI who did CT which showed strangulated hernia. Pt was admitted and went for laparascopic primary repair of umbilical hernia done by Dr Rosendo Gros last night.  Asked to see for ESRD.    Pt on PD for about 6 mos.  She denies any SOB, CP, abd pain or leg swelling. Has never had HD and does not have an AVF/ AVG.      ROS  denies CP  no joint pain   no HA  no blurry vision  no rash  no diarrhea  no nausea/ vomiting   Past Medical History  Past Medical History:  Diagnosis Date  . Allergy   . Anemia   . Anxiety   . Blood transfusion without reported diagnosis   . Cardiomyopathy ([place]) 11/2016   no ischemic eval due to CKD  . CHF (congestive heart failure) ([place])   . CKD (chronic kidney disease), stage IV ([place])   . Depression   . Diabetes mellitus without complication ([place])    type 2  . Dyspnea   . Hypertension   . Obesity    Past Surgical History  Past Surgical History:  Procedure Laterality Date  . ABDOMINAL HYSTERECTOMY    . BREAST SURGERY     reduction  . [place]  . LAPAROSCOPY N/A 06/16/2019   Procedure: primary closure of umbilical hernia;  Surgeon: Ralene Ok, MD;  Location: [place];  Service: General;  Laterality: N/A;  . LIPOMA EXCISION     back  Dr. Excell Seltzer 03-22-18  . LIPOMA EXCISION N/A 03/22/2018   Procedure: EXCISION OF BACK LIPOMA;  Surgeon: Excell Seltzer, MD;  Location: [facility];  Service: General;  Laterality: N/A;  . REDUCTION MAMMAPLASTY Bilateral    Family History  Family History  Problem Relation Age of Onset  . Diabetes Brother   . Stomach cancer Brother   . Kidney disease  Brother   . Heart Problems Maternal Grandmother        pacemaker  . Heart disease Maternal Grandfather   . Rectal cancer Neg Hx   . Esophageal cancer Neg Hx   . Liver cancer Neg Hx   . Colon cancer Neg Hx    Social History  reports that she has never smoked. She has never used smokeless tobacco. She reports that she does not drink alcohol or use drugs. Allergies  Allergies  Allergen Reactions  . Codone [Hydrocodone] Itching  . Norvasc [Amlodipine Besylate] Swelling    Lower extremity?  . Sulfa Antibiotics Itching and Rash   Home medications Prior to Admission medications   Medication Sig Start Date End Date Taking? Authorizing Provider  acetaminophen (TYLENOL) 325 MG tablet Take 2 tablets (650 mg total) by mouth every 6 (six) hours as needed. Patient taking differently: Take 650 mg by mouth every 6 (six) hours as needed for headache (pain).  05/25/19  Yes Varney Biles, MD  albuterol (PROVENTIL HFA;VENTOLIN HFA) 108 (90 Base) MCG/ACT inhaler Inhale 1-2 puffs into the lungs every 6 (six) hours as needed for wheezing or shortness of breath. 10/11/18  Yes Marney Setting, NP  aspirin EC 81 MG tablet Take 1 tablet (81 mg total) by mouth daily. 01/04/17  Yes Langeland, Dawn T, MD  atorvastatin (LIPITOR) 20 MG tablet Take 1/2 (one-half) tablet by mouth once daily Patient taking differently: Take 10 mg by mouth daily.  06/12/19  Yes Ladell Pier, MD  calcium carbonate (TUMS - DOSED IN MG ELEMENTAL CALCIUM) 500 MG chewable tablet Chew 1 tablet by mouth 3 (three) times daily as needed for indigestion or heartburn.    Yes [provider]  carvedilol (COREG) 25 MG tablet TAKE 1 TABLET BY MOUTH TWICE DAILY WITH A MEAL Patient taking differently: Take 25 mg by mouth 2 (two) times daily with a meal.  05/04/19  Yes Croitoru, Mihai, MD  dicyclomine (BENTYL) 10 MG capsule Take 1 capsule (10 mg total) by mouth every 6 (six) hours as needed for spasms. 06/16/19  Yes Levin Erp,  PA  escitalopram (LEXAPRO) 20 MG tablet TAKE 1 TABLET BY MOUTH AT BEDTIME. Patient taking differently: Take 20 mg by mouth at bedtime.  04/25/19  Yes Ladell Pier, MD  fluticasone (FLONASE) 50 MCG/ACT nasal spray Place 1 spray into both nostrils daily. Patient taking differently: Place 1 spray into both nostrils daily as needed for allergies.  07/02/18  Yes Jacqualine Mau, NP  furosemide (LASIX) 80 MG tablet Take 1 tablet (80 mg total) by mouth 2 (two) times daily. Patient taking differently: Take 80 mg by mouth daily.  12/07/18  Yes Elgergawy, Silver Huguenin, MD  gabapentin (NEURONTIN) 100 MG capsule TAKE 1 CAPSULE (100 MG TOTAL) BY MOUTH AT BEDTIME. 12/05/18  Yes Ladell Pier, MD  Garlic 122 MG TABS Take 100 mg by mouth daily.    Yes [provider]  gentamicin cream (GARAMYCIN) 0.1 % Apply 1 application topically See admin instructions. Apply topically to dialysis site after showering - every other day 06/10/19  Yes [provider]  glucose 4 GM chewable tablet Chew 1 tablet by mouth once as needed for low blood sugar.    Yes [provider]  hydrALAZINE (APRESOLINE) 50 MG tablet Take 1 tablet (50 mg total) by mouth 2 (two) times daily. 09/16/18  Yes Ladell Pier, MD  insulin aspart (NOVOLOG) 100 UNIT/ML injection 3 units before meals. Patient taking differently: Inject 2-3 Units into the skin 3 (three) times daily as needed for high blood sugar (CBG >200).  04/06/17  Yes Ladell Pier, MD  Insulin Glargine (LANTUS SOLOSTAR) 100 UNIT/ML Solostar Pen Inject 4 Units into the skin at bedtime. Patient taking differently: Inject 24 Units into the skin at bedtime as needed (sugar levels).  12/07/18  Yes Elgergawy, Silver Huguenin, MD  isosorbide mononitrate (IMDUR) 30 MG 24 hr tablet Take 1/2 (one-half) tablet by mouth once daily Patient taking differently: Take 15 mg by mouth daily.  06/12/19  Yes Croitoru, Mihai, MD  ondansetron (ZOFRAN ODT) 4 MG disintegrating  tablet Take every 4-6 hours as needed for nausea Patient taking differently: Take 4 mg by mouth every 4 (four) hours as needed for nausea or vomiting.  06/16/19  Yes Lemmon, Lavone Nian, PA  Polyethyl Glycol-Propyl Glycol (SYSTANE) 0.4-0.3 % SOLN Place 1 drop into both eyes 3 (three) times daily as needed (dry eyes).    Yes [provider]  prednisoLONE acetate (PRED FORTE) 1 % ophthalmic suspension    Yes [provider]  sevelamer (RENAGEL) 800 MG tablet Take 1,600-3,200 mg by mouth See admin instructions. Take 2-4 tablets (1600-3200 mg) by  mouth up to three times daily with meals - 2-3 tablets with small meal, 4 tablets with large meal   Yes [provider]  ACCU-CHEK FASTCLIX LANCETS MISC Uad tid 10/21/18   Ladell Pier, MD  Blood Glucose Monitoring Suppl (ACCU-CHEK GUIDE) w/Device KIT 1 each by Does not apply route 3 (three) times daily. Use tid as directed 10/21/18   Ladell Pier, MD  glucose blood (ACCU-CHEK GUIDE) test strip Use as instructed tid 10/21/18   Ladell Pier, MD  Insulin Pen Needle (TRUEPLUS PEN NEEDLES) 32G X 4 MM MISC Use as directed to inject insulin. 03/25/18   Ladell Pier, MD  Insulin Pen Needle 31G X 5 MM MISC Use as directed 03/28/18   Ladell Pier, MD   Liver Function Tests Recent Labs  Lab 06/16/19 1843  AST 15  ALT 15  ALKPHOS 58  BILITOT 0.7  PROT 6.4*  ALBUMIN 2.9*   Recent Labs  Lab 06/16/19 1843  LIPASE 32   CBC Recent Labs  Lab 06/16/19 1843 06/17/19 0344  WBC 8.3 8.2  HGB 9.3* 9.0*  HCT 28.4* 28.1*  MCV 95.9 97.6  PLT 231 300   Basic Metabolic Panel Recent Labs  Lab 06/16/19 1843 06/17/19 0344  NA 138 138  K 3.2* 4.0  CL 100 101  CO2 23 23  GLUCOSE 123* 187*  BUN 27* 30*  CREATININE 7.36* 7.28*  CALCIUM 8.5* 8.4*   Iron/TIBC/Ferritin/ %Sat    Component Value Date/Time   IRON 30 10/25/2018 0836   TIBC 252 10/25/2018 0836   FERRITIN 252 10/25/2018 0836   IRONPCTSAT 12  10/25/2018 0836   IRONPCTSAT 13 10/13/2016 0831    Vitals:   06/17/19 0755 06/17/19 0819 06/17/19 0855 06/17/19 1118  BP: (!) 178/83 (!) 168/73 (!) 157/69 (!) 154/48  Pulse: 79 83 83 79  Resp: 12 18 14 17   Temp: 97.8 F (36.6 C) 98 F (36.7 C)  98 F (36.7 C)  TempSrc:  Oral  Oral  SpO2: 100% 98% 97% 98%  Weight:      Height:        Exam Gen alert, on distress No rash, cyanosis or gangrene Sclera anicteric, throat clear  No jvd or bruits Chest clear bilat to bases, no rales or wheezing RRR no MRG Abd soft ntnd no mass or ascites, RLQ PD cath intact, L abd stab wounds are dry, +BS, mildly tender diffusely GU defer MS no joint effusions or deformity Ext no LE or UE edema, no wounds or ulcers Neuro is alert, Ox 3 , nf    Home meds:  - aspirin 81/ atorvastatin 10 qd/ isosorbide mononitrate 15 qd  - hydralazine 50 bid/ furosemide 80 qd/ carvedilol 25 bid  - insulin glargine 24u hs/ insulin aspart   - gabapentin 100 hs/ escitalopram 20 hs  - sevelamer 2-3 ac tid  - prn's/ vitamins/ supplements    Outpt HD: PD 5 exchanges, 2.4 L fill volume, 1.5 hr dwells, no last fill or day exchange     Assessment/ Plan: 1. Incarc umbilical hernia - sp repair last night 9/4.   2. ESRD - pt on PD for about 6 mos. Pt strongly against temporary HD. Pt prob has residual fxn. Per UTD recommends ~ 1 week of low vol supine PD post abd surgery if can be done safely and pt clinically stable. Will plan this for now and then if fails put HD cath in.  3. VOlume - euvolemic on  exam 4. HTN - is getting her home meds here 5. MBD ckd - cont binder 6. DM2      Kelly Splinter  MD 06/17/2019, 12:00 PM

## 2019-06-17 NOTE — Anesthesia Postprocedure Evaluation (Signed)
Anesthesia Post Note  Patient: SHANDY CHECO  Procedure(s) Performed: primary closure of umbilical hernia (N/A Abdomen)     Patient location during evaluation: PACU Anesthesia Type: General Level of consciousness: awake and alert Pain management: pain level controlled Vital Signs Assessment: post-procedure vital signs reviewed and stable Respiratory status: spontaneous breathing, nonlabored ventilation, respiratory function stable and patient connected to nasal cannula oxygen Cardiovascular status: blood pressure returned to baseline and stable Postop Assessment: no apparent nausea or vomiting Anesthetic complications: no    Last Vitals:  Vitals:   06/16/19 2337 06/16/19 2353  BP: (!) 164/60 (!) 185/75  Pulse: 77 79  Resp: 12 20  Temp: 36.6 C (!) 36.4 C  SpO2: 99% 100%    Last Pain:  Vitals:   06/16/19 2353  TempSrc: Oral  PainSc:                  Lissandro Dilorenzo,W. EDMOND

## 2019-06-17 NOTE — Progress Notes (Signed)
1 Day Post-Op   Subjective/Chief Complaint: Pain tolerable, "much better than yesterday."  No n/v.  Tolerating breakfast.  BPs quite high overnight.     Objective: Vital signs in last 24 hours: Temp:  [97.5 F (36.4 C)-98.5 F (36.9 C)] 98 F (36.7 C) (09/05 0819) Pulse Rate:  [73-88] 83 (09/05 0855) Resp:  [12-30] 14 (09/05 0855) BP: (124-210)/(60-187) 157/69 (09/05 0855) SpO2:  [94 %-100 %] 97 % (09/05 0855) Weight:  [70.6 kg-71.7 kg] 70.6 kg (09/04 2353) Last BM Date: 06/15/19  Intake/Output from previous day: 09/04 0701 - 09/05 0700 In: 200 [IV Piggyback:200] Out: -  Intake/Output this shift: No intake/output data recorded.  General appearance: alert, cooperative and no distress Resp: breathing comfortably GI: soft, non distended.  PD catheter in place.  incisions c/d/i.  approp tender at incisions.   Extremities: extremities normal, atraumatic, no cyanosis or edema  Lab Results:  Recent Labs    06/16/19 1843 06/17/19 0344  WBC 8.3 8.2  HGB 9.3* 9.0*  HCT 28.4* 28.1*  PLT 231 193   BMET Recent Labs    06/16/19 1843 06/17/19 0344  NA 138 138  K 3.2* 4.0  CL 100 101  CO2 23 23  GLUCOSE 123* 187*  BUN 27* 30*  CREATININE 7.36* 7.28*  CALCIUM 8.5* 8.4*   PT/INR No results for input(s): LABPROT, INR in the last 72 hours. ABG No results for input(s): PHART, HCO3 in the last 72 hours.  Invalid input(s): PCO2, PO2  Studies/Results: Ct Abdomen Wo Contrast  Result Date: 06/16/2019 CLINICAL DATA:  Severe epigastric abdominal pain radiating into umbilical area and left side, recently treated for diverticulitis, peritoneal dialysis EXAM: CT ABDOMEN WITHOUT CONTRAST TECHNIQUE: Multidetector CT imaging of the abdomen was performed following the standard protocol without IV contrast. COMPARISON:  CT abdomen pelvis, 05/25/2019 FINDINGS: Lower chest: No acute abnormality. Hepatobiliary: No solid liver abnormality is seen. No gallstones, gallbladder wall thickening,  or biliary dilatation. Pancreas: Unremarkable. No pancreatic ductal dilatation or surrounding inflammatory changes. Spleen: Normal in size without significant abnormality. Adrenals/Urinary Tract: Adrenal glands are unremarkable. Kidneys are normal, without renal calculi, solid lesion, or hydronephrosis. Bladder is unremarkable. Stomach/Bowel: Stomach is within normal limits. The sigmoid colon is incompletely imaged, the most proximal portion is included at the lower extent of the examination and appears thickened, with severe diverticulosis. Vascular/Lymphatic: Aortic atherosclerosis. No enlarged abdominal lymph nodes. Other: There is a small, fat, small bowel, and fluid containing umbilical hernia, which is incompletely imaged although contains a single, edematous appearing loop of mid small bowel without proximal evidence of obstruction. Measured portion of the hernia sac measures approximately 5.2 cm with a neck measuring 2.6 cm (series 2, image 53). There is a partially imaged peritoneal dialysis catheter in the soft tissues of the ventral abdomen, the intra-abdominal portion of the catheter is not imaged. Small volume ascites, in keeping with peritoneal dialysate. Musculoskeletal: No acute or significant osseous findings. IMPRESSION: 1. There is a small, fat, small bowel, and fluid containing umbilical hernia, which is incompletely imaged although contains a single, edematous appearing loop of mid small bowel without proximal evidence of obstruction. Measured portion of the hernia sac measures approximately 5.2 cm with a neck measuring 2.6 cm (series 2, image 53). This has increased in size when compared to prior examination, with new involvement of the bowel, and findings are concerning for incarcerated or strangulated hernia. 2. The sigmoid colon is incompletely imaged, the most proximal portion is included at the lower extent of  the examination and appears thickened, with severe diverticulosis. Findings are  in keeping with diverticulitis identified on prior examination although complete imaging of the sigmoid colon would be helpful to assess for complications of diverticulitis if suspected based on persistent symptoms. 3. Small volume ascites, in keeping with peritoneal dialysate. There is a partially imaged peritoneal dialysis catheter in the soft tissues of the ventral abdomen, the intra-abdominal portion of the catheter is not imaged. These results will be called to the ordering clinician or representative by the Radiologist Assistant, and communication documented in the PACS or zVision Dashboard. Electronically Signed   By: Eddie Candle M.D.   On: 06/16/2019 16:38    Anti-infectives: Anti-infectives (From admission, onward)   Start     Dose/Rate Route Frequency Ordered Stop   06/17/19 2200  piperacillin-tazobactam (ZOSYN) IVPB 2.25 g     2.25 g 100 mL/hr over 30 Minutes Intravenous Every 8 hours 06/16/19 2201     06/16/19 2045  cefoTEtan (CEFOTAN) 2 g in sodium chloride 0.9 % 100 mL IVPB     2 g 200 mL/hr over 30 Minutes Intravenous To Surgery 06/16/19 2022 06/16/19 2154      Assessment/Plan: s/p Procedure(s): primary closure of umbilical hernia (N/A), diagnostic laparoscopy diet as tolerated  Adding PRN tramadol and oxy to pain regimen as she has just had morphine on board.  Home when medically stable     LOS: 1 day    Stark Klein 06/17/2019

## 2019-06-17 NOTE — Plan of Care (Signed)
  Problem: Health Behavior/Discharge Planning: Goal: Ability to manage health-related needs will improve Outcome: Progressing   Problem: Clinical Measurements: Goal: Respiratory complications will improve Outcome: Progressing Goal: Cardiovascular complication will be avoided Outcome: Progressing

## 2019-06-18 LAB — GLUCOSE, CAPILLARY
Glucose-Capillary: 257 mg/dL — ABNORMAL HIGH (ref 70–99)
Glucose-Capillary: 189 mg/dL — ABNORMAL HIGH (ref 70–99)
Glucose-Capillary: 245 mg/dL — ABNORMAL HIGH (ref 70–99)
Glucose-Capillary: 251 mg/dL — ABNORMAL HIGH (ref 70–99)

## 2019-06-18 LAB — CBC
HCT: 25.3 % — ABNORMAL LOW (ref 36.0–46.0)
Hemoglobin: 8.2 g/dL — ABNORMAL LOW (ref 12.0–15.0)
MCH: 30.9 pg (ref 26.0–34.0)
MCHC: 32.4 g/dL (ref 30.0–36.0)
MCV: 95.5 fL (ref 80.0–100.0)
Platelets: 210 10*3/uL (ref 150–400)
RBC: 2.65 MIL/uL — ABNORMAL LOW (ref 3.87–5.11)
RDW: 14.6 % (ref 11.5–15.5)
WBC: 14.5 10*3/uL — ABNORMAL HIGH (ref 4.0–10.5)
nRBC: 0 % (ref 0.0–0.2)

## 2019-06-18 LAB — BASIC METABOLIC PANEL
Anion gap: 14 (ref 5–15)
BUN: 46 mg/dL — ABNORMAL HIGH (ref 6–20)
CO2: 24 mmol/L (ref 22–32)
Calcium: 8.1 mg/dL — ABNORMAL LOW (ref 8.9–10.3)
Chloride: 99 mmol/L (ref 98–111)
Creatinine, Ser: 7.42 mg/dL — ABNORMAL HIGH (ref 0.44–1.00)
GFR calc Af Amer: 6 mL/min — ABNORMAL LOW (ref >60)
GFR calc non Af Amer: 5 mL/min — ABNORMAL LOW (ref >60)
Glucose, Bld: 258 mg/dL — ABNORMAL HIGH (ref 70–99)
Potassium: 3.9 mmol/L (ref 3.5–5.1)
Sodium: 137 mmol/L (ref 135–145)

## 2019-06-18 LAB — PHOSPHORUS: Phosphorus: 6.5 mg/dL — ABNORMAL HIGH (ref 2.5–4.6)

## 2019-06-18 LAB — MAGNESIUM: Magnesium: 1.9 mg/dL (ref 1.7–2.4)

## 2019-06-18 MED ORDER — DELFLEX-LC/1.5% DEXTROSE 344 MOSM/L IP SOLN: INTRAPERITONEAL | Status: DC

## 2019-06-18 MED ORDER — HEPARIN 1000 UNIT/ML FOR PERITONEAL DIALYSIS: 500.0000 [IU] | INTRAMUSCULAR | Status: DC | PRN

## 2019-06-18 MED ORDER — GENTAMICIN SULFATE 0.1 % EX CREA
1.0000 "application " | TOPICAL_CREAM | Freq: Every day | CUTANEOUS | Status: DC
  Administered 2019-06-19 – 2019-06-20 (×2): 1 via TOPICAL
  Filled 2019-06-18: qty 15

## 2019-06-18 MED ORDER — HEPARIN 1000 UNIT/ML FOR PERITONEAL DIALYSIS
INTRAPERITONEAL | Status: DC | PRN
  Filled 2019-06-18: qty 5000

## 2019-06-18 MED ORDER — HYDRALAZINE HCL 50 MG PO TABS
100.0000 mg | ORAL_TABLET | Freq: Two times a day (BID) | ORAL | Status: DC
  Administered 2019-06-18 – 2019-06-20 (×5): 100 mg via ORAL
  Filled 2019-06-18 (×6): qty 2

## 2019-06-18 MED ORDER — INSULIN GLARGINE 100 UNIT/ML ~~LOC~~ SOLN
20.0000 [IU] | Freq: Every day | SUBCUTANEOUS | Status: DC
  Administered 2019-06-18 – 2019-06-19 (×2): 20 [IU] via SUBCUTANEOUS
  Filled 2019-06-18 (×3): qty 0.2

## 2019-06-18 NOTE — Final Consult Note (Signed)
Consultant Final Sign-Off Note    Assessment/Final recommendations  Patricia [name] is a 63 y.o. female followed by me for  Incarcerated umbilical hernia - S/P laparoscopy diagnostic, primary repair of hernia, Dr. Rosendo Gros, 09/04 POD#2   Wound care (if applicable): glue intact, shower okay but no bathing   Diet at discharge: per primary team   Activity at discharge: no heavy lifting >20lbs for 6-8 weeks   Follow-up appointment:  2 weeks Dr. Rosendo Gros   Pending results:  Unresulted Labs (From admission, onward)   None       Medication recommendations:   Other recommendations:    Thank you for allowing [place] to participate in the care of your patient!  Please consult [place] again if you have further needs for your patient.  Kalman Drape 06/18/2019 10:13 AM    Subjective   CC: abdominal pain  Pain last night with PD. No real pain from surgery. She is tolerating her diet and having bowel function. Copious flatus.   Objective  Vital signs in last 24 hours: Temp:  [97.6 F (36.4 C)-98 F (36.7 C)] 97.7 F (36.5 C) (09/06 0735) Pulse Rate:  [72-82] 78 (09/06 0541) Resp:  [13-18] 18 (09/06 0735) BP: (137-173)/(48-80) 161/70 (09/06 0735) SpO2:  [98 %-99 %] 98 % (09/06 0735)  PE: Gen:  Alert, NAD, pleasant, cooperative, eating breakfast Pulm:  Rate and effort normal Abd: soft, non distended. PD catheter in place. +BS. incisions with glue appear well. no TTP Skin: no rashes noted, warm and dry   Pertinent labs and Studies: Recent Labs    06/16/19 1843 06/17/19 0344 06/18/19 0408  WBC 8.3 8.2 14.5*  HGB 9.3* 9.0* 8.2*  HCT 28.4* 28.1* 25.3*   BMET Recent Labs    06/17/19 0344 06/18/19 0408  NA 138 137  K 4.0 3.9  CL 101 99  CO2 23 24  GLUCOSE 187* 258*  BUN 30* 46*  CREATININE 7.28* 7.42*  CALCIUM 8.4* 8.1*   No results for input(s): LABURIN in the last 72 hours. Results for orders placed or performed during the hospital encounter of 06/16/19  SARS  Coronavirus 2 [facility] order, Performed in [facility] hospital lab) Nasopharyngeal Nasopharyngeal Swab     Status: None   Collection Time: 06/16/19  7:50 PM   Specimen: Nasopharyngeal Swab  Result Value Ref Range Status   SARS Coronavirus 2 NEGATIVE NEGATIVE Final    Comment: (NOTE) If result is NEGATIVE SARS-CoV-2 target nucleic acids are NOT DETECTED. The SARS-CoV-2 RNA is generally detectable in upper and lower  respiratory specimens during the acute phase of infection. The lowest  concentration of SARS-CoV-2 viral copies this assay can detect is 250  copies / mL. A negative result does not preclude SARS-CoV-2 infection  and should not be used as the sole basis for treatment or other  patient management decisions.  A negative result may occur with  improper specimen collection / handling, submission of specimen other  than nasopharyngeal swab, presence of viral mutation(s) within the  areas targeted by this assay, and inadequate number of viral copies  (<250 copies / mL). A negative result must be combined with clinical  observations, patient history, and epidemiological information. If result is POSITIVE SARS-CoV-2 target nucleic acids are DETECTED. The SARS-CoV-2 RNA is generally detectable in upper and lower  respiratory specimens dur ing the acute phase of infection.  Positive  results are indicative of active infection with SARS-CoV-2.  Clinical  correlation with patient history and other  diagnostic information is  necessary to determine patient infection status.  Positive results do  not rule out bacterial infection or co-infection with other viruses. If result is PRESUMPTIVE POSTIVE SARS-CoV-2 nucleic acids MAY BE PRESENT.   A presumptive positive result was obtained on the submitted specimen  and confirmed on repeat testing.  While 2019 novel coronavirus  (SARS-CoV-2) nucleic acids may be present in the submitted sample  additional confirmatory testing may be necessary  for epidemiological  and / or clinical management purposes  to differentiate between  SARS-CoV-2 and other Sarbecovirus currently known to infect humans.  If clinically indicated additional testing with an alternate test  methodology [id number]) is advised. The SARS-CoV-2 RNA is generally  detectable in upper and lower respiratory sp ecimens during the acute  phase of infection. The expected result is Negative. Fact Sheet for Patients:  StrictlyIdeas.no Fact Sheet for Healthcare Providers: BankingDealers.co.za This test is not yet approved or cleared by the [place] FDA and has been authorized for detection and/or diagnosis of SARS-CoV-2 by FDA under an Emergency Use Authorization (EUA).  This EUA will remain in effect (meaning this test can be used) for the duration of the COVID-19 declaration under Section 564(b)(1) of the Act, 21 U.S.C. section 360bbb-3(b)(1), unless the authorization is terminated or revoked sooner. Performed at [facility], [place] [address], [place], [place] [address]     Imaging: No results found.

## 2019-06-18 NOTE — Progress Notes (Signed)
[facility] Progress Note  Subjective: did well overnight w/ PD, is finished , awaiting disconnect. No new issues, eating well.    Vitals:   06/18/19 0219 06/18/19 0541 06/18/19 0735 06/18/19 1021  BP: (!) 160/65 (!) 173/80 (!) 161/70 135/71  Pulse: 74 78  68  Resp: 16 13 18 11   Temp:  97.8 F (36.6 C) 97.7 F (36.5 C) 97.7 F (36.5 C)  TempSrc:  Oral    SpO2:  98% 98% 97%  Weight:      Height:        Inpatient medications: . atorvastatin  10 mg Oral Daily  . carvedilol  25 mg Oral BID WC  . enoxaparin (LOVENOX) injection  30 mg Subcutaneous Q24H  . escitalopram  20 mg Oral QHS  . furosemide  80 mg Oral Daily  . gentamicin cream  1 application Topical Daily  . hydrALAZINE  100 mg Oral BID  . insulin aspart  0-9 Units Subcutaneous TID WC  . isosorbide mononitrate  15 mg Oral Daily  . sevelamer carbonate  1,600 mg Oral TID WC  . sodium chloride flush  3 mL Intravenous Once    acetaminophen **OR** acetaminophen, diphenhydrAMINE, heparin, hydrALAZINE, morphine injection, ondansetron **OR** ondansetron (ZOFRAN) IV, oxyCODONE, traMADol    Exam: Gen alert, on distress No rash, cyanosis or gangrene Sclera anicteric, throat clear  No jvd or bruits Chest clear bilat to bases, no rales or wheezing RRR no MRG Abd soft ntnd no mass or ascites, RLQ PD cath intact, L abd stab wounds are dry, +BS, mildly tender diffusely GU defer MS no joint effusions or deformity Ext no LE or UE edema, no wounds or ulcers Neuro is alert, Ox 3 , nf    Home meds:  - aspirin 81/ atorvastatin 10 qd/ isosorbide mononitrate 15 qd  - hydralazine 50 bid/ furosemide 80 qd/ carvedilol 25 bid  - insulin glargine 24u hs/ insulin aspart   - gabapentin 100 hs/ escitalopram 20 hs  - sevelamer 2-3 ac tid  - prn's/ vitamins/ supplements    Outpt HD: PD 5 exchanges, 2.4 L fill volume, 1.5 hr dwells, no last fill or day exchange  Low - volume regimen here > 8 exchanges, 500 cc volume ,  1 hr dwell time     Assessment/ Plan: 1. Incarc umbilical hernia - sp repair last night 9/4.   2. ESRD - pt on PD for about 6 mos. Pt would like to avoid HD if possible. Has residual fxn ~ 8 ml/min.  Labs stable today. Repeat supine low-vol PD (see above) tonight, if stable possibly can dc tomorrow. Would need home machine recalibrated. Have d/w home training.  3. VOlume - euvolemic on exam 4. HTN - is getting her home meds here 5. MBD ckd - cont binder 6. DM2      [facility] 06/18/2019, 11:05 AM  Iron/TIBC/Ferritin/ %Sat    Component Value Date/Time   IRON 30 10/25/2018 0836   TIBC 252 10/25/2018 0836   FERRITIN 252 10/25/2018 0836   IRONPCTSAT 12 10/25/2018 0836   IRONPCTSAT 13 10/13/2016 0831   Recent Labs  Lab 06/16/19 1843  06/18/19 0408  NA 138   < > 137  K 3.2*   < > 3.9  CL 100   < > 99  CO2 23   < > 24  GLUCOSE 123*   < > 258*  BUN 27*   < > 46*  CREATININE 7.36*   < >  7.42*  CALCIUM 8.5*   < > 8.1*  PHOS  --   --  6.5*  ALBUMIN 2.9*  --   --    < > = values in this interval not displayed.   Recent Labs  Lab 06/16/19 1843  AST 15  ALT 15  ALKPHOS 58  BILITOT 0.7  PROT 6.4*   Recent Labs  Lab 06/18/19 0408  WBC 14.5*  HGB 8.2*  HCT 25.3*  PLT 210

## 2019-06-18 NOTE — Progress Notes (Deleted)
[facility] Progress Note  2 Days Post-Op   Assessment/Plan Principal Problem:   Strangulated umbilical hernia Active Problems:   CHF (congestive heart failure) (HCC)   ESRD (end stage renal disease) (HCC)   Hypokalemia   Hypertensive urgency   Umbilical hernia  Incarcerated umbilical hernia - S/P laparoscopy diagnostic, primary repair of hernia, Dr. Rosendo Gros, 09/04 POD#2 - doing well and okay for discharge from a surgical standpoint  FEN: renal diet VTE: SCD's, lovenox ID: [id number] 09/04-09/05 Follow up: Dr. Rosendo Gros    LOS: 2 days    Subjective: CC: abdominal pain  Pain last night with PD. No real pain from surgery. She is tolerating her diet and having bowel function. Copious flatus.   Objective: Vital signs in last 24 hours: Temp:  [97.6 F (36.4 C)-98 F (36.7 C)] 97.7 F (36.5 C) (09/06 0735) Pulse Rate:  [72-82] 78 (09/06 0541) Resp:  [13-18] 18 (09/06 0735) BP: (137-173)/(48-80) 161/70 (09/06 0735) SpO2:  [98 %-99 %] 98 % (09/06 0735) Last BM Date: 06/15/19  Intake/Output from previous day: 09/05 0701 - 09/06 0700 In: 590 [P.O.:590] Out: -  Intake/Output this shift: No intake/output data recorded.  PE: Gen:  Alert, NAD, pleasant, cooperative, eating breakfast Pulm:  Rate and effort normal Abd: soft, non distended.  PD catheter in place. +BS.  incisions with glue appear well.  no TTP Skin: no rashes noted, warm and dry   Anti-infectives: Anti-infectives (From admission, onward)   Start     Dose/Rate Route Frequency Ordered Stop   06/17/19 2200  piperacillin-tazobactam (ZOSYN) IVPB 2.25 g  Status:  Discontinued     2.25 g 100 mL/hr over 30 Minutes Intravenous Every 8 hours 06/16/19 2201 06/17/19 1151   06/16/19 2045  cefoTEtan (CEFOTAN) 2 g in sodium chloride 0.9 % 100 mL IVPB     2 g 200 mL/hr over 30 Minutes Intravenous To Surgery 06/16/19 2022 06/16/19 2154      Lab Results:  Recent Labs    06/17/19 0344 06/18/19 0408   WBC 8.2 14.5*  HGB 9.0* 8.2*  HCT 28.1* 25.3*  PLT 193 210   BMET Recent Labs    06/17/19 0344 06/18/19 0408  NA 138 137  K 4.0 3.9  CL 101 99  CO2 23 24  GLUCOSE 187* 258*  BUN 30* 46*  CREATININE 7.28* 7.42*  CALCIUM 8.4* 8.1*   PT/INR No results for input(s): LABPROT, INR in the last 72 hours. CMP     Component Value Date/Time   NA 137 06/18/2019 0408   NA 143 05/23/2018 1607   K 3.9 06/18/2019 0408   CL 99 06/18/2019 0408   CO2 24 06/18/2019 0408   GLUCOSE 258 (H) 06/18/2019 0408   BUN 46 (H) 06/18/2019 0408   BUN 73 (H) 05/23/2018 1607   CREATININE 7.42 (H) 06/18/2019 0408   CREATININE 2.22 (H) 12/25/2016 1000   CALCIUM 8.1 (L) 06/18/2019 0408   CALCIUM 8.0 (L) 08/30/2018 1300   PROT 6.4 (L) 06/16/2019 1843   ALBUMIN 2.9 (L) 06/16/2019 1843   AST 15 06/16/2019 1843   ALT 15 06/16/2019 1843   ALKPHOS 58 06/16/2019 1843   BILITOT 0.7 06/16/2019 1843   GFRNONAA 5 (L) 06/18/2019 0408   GFRNONAA 24 (L) 12/25/2016 1000   GFRAA 6 (L) 06/18/2019 0408   GFRAA 27 (L) 12/25/2016 1000   Lipase     Component Value Date/Time   LIPASE 32 06/16/2019 1843    Studies/Results: Ct Abdomen Wo Contrast  Result Date: 06/16/2019 CLINICAL DATA:  Severe epigastric abdominal pain radiating into umbilical area and left side, recently treated for diverticulitis, peritoneal dialysis EXAM: CT ABDOMEN WITHOUT CONTRAST TECHNIQUE: Multidetector CT imaging of the abdomen was performed following the standard protocol without IV contrast. COMPARISON:  CT abdomen pelvis, 05/25/2019 FINDINGS: Lower chest: No acute abnormality. Hepatobiliary: No solid liver abnormality is seen. No gallstones, gallbladder wall thickening, or biliary dilatation. Pancreas: Unremarkable. No pancreatic ductal dilatation or surrounding inflammatory changes. Spleen: Normal in size without significant abnormality. Adrenals/Urinary Tract: Adrenal glands are unremarkable. Kidneys are normal, without renal calculi, solid  lesion, or hydronephrosis. Bladder is unremarkable. Stomach/Bowel: Stomach is within normal limits. The sigmoid colon is incompletely imaged, the most proximal portion is included at the lower extent of the examination and appears thickened, with severe diverticulosis. Vascular/Lymphatic: Aortic atherosclerosis. No enlarged abdominal lymph nodes. Other: There is a small, fat, small bowel, and fluid containing umbilical hernia, which is incompletely imaged although contains a single, edematous appearing loop of mid small bowel without proximal evidence of obstruction. Measured portion of the hernia sac measures approximately 5.2 cm with a neck measuring 2.6 cm (series 2, image 53). There is a partially imaged peritoneal dialysis catheter in the soft tissues of the ventral abdomen, the intra-abdominal portion of the catheter is not imaged. Small volume ascites, in keeping with peritoneal dialysate. Musculoskeletal: No acute or significant osseous findings. IMPRESSION: 1. There is a small, fat, small bowel, and fluid containing umbilical hernia, which is incompletely imaged although contains a single, edematous appearing loop of mid small bowel without proximal evidence of obstruction. Measured portion of the hernia sac measures approximately 5.2 cm with a neck measuring 2.6 cm (series 2, image 53). This has increased in size when compared to prior examination, with new involvement of the bowel, and findings are concerning for incarcerated or strangulated hernia. 2. The sigmoid colon is incompletely imaged, the most proximal portion is included at the lower extent of the examination and appears thickened, with severe diverticulosis. Findings are in keeping with diverticulitis identified on prior examination although complete imaging of the sigmoid colon would be helpful to assess for complications of diverticulitis if suspected based on persistent symptoms. 3. Small volume ascites, in keeping with peritoneal dialysate.  There is a partially imaged peritoneal dialysis catheter in the soft tissues of the ventral abdomen, the intra-abdominal portion of the catheter is not imaged. These results will be called to the ordering clinician or representative by the Radiologist Assistant, and communication documented in the PACS or zVision Dashboard. Electronically Signed   By: Eddie Candle M.D.   On: 06/16/2019 16:38      Kalman Drape , [facility] 06/18/2019, 9:10 AM  Pager: [phone] Mon-Wed, Friday 7:00am-4:30pm Thurs 7am-11:30am  Consults: [phone]

## 2019-06-18 NOTE — Discharge Instructions (Signed)
LAPAROSCOPIC SURGERY: POST OP INSTRUCTIONS  1. DIET: Follow a light bland diet the first 24 hours after arrival home, such as soup, liquids, crackers, etc. Be sure to include lots of fluids daily. Avoid fast food or heavy meals as your are more likely to get nauseated. Eat a low fat the next few days after surgery.  2. Take your usually prescribed home medications unless otherwise directed. 3. PAIN CONTROL:  1. Pain is best controlled by a usual combination of three different methods TOGETHER:  1. Ice/Heat 2. Over the counter pain medication 3. Prescription pain medication 2. Most patients will experience some swelling and bruising around the incisions. Ice packs or heating pads (30-60 minutes up to 6 times a day) will help. Use ice for the first few days to help decrease swelling and bruising, then switch to heat to help relax tight/sore spots and speed recovery. Some people prefer to use ice alone, heat alone, alternating between ice & heat. Experiment to what works for you. Swelling and bruising can take several weeks to resolve.  3. It is helpful to take an over-the-counter pain medication regularly for the first few weeks. Choose one of the following that works best for you:  1. Naproxen (Aleve, etc) Two 220mg  tabs twice a day 2. Ibuprofen (Advil, etc) Three 200mg  tabs four times a day (every meal & bedtime) 3. Acetaminophen (Tylenol, etc) 500-650mg  four times a day (every meal & bedtime) 4. A prescription for pain medication (such as oxycodone, hydrocodone, etc) should be given to you upon discharge. Take your pain medication as prescribed.  1. If you are having problems/concerns with the prescription medicine (does not control pain, nausea, vomiting, rash, itching, etc), please call us [phone] to see if we need to switch you to a different pain medicine that will work better for you and/or control your side effect better. 2. If you need a refill on your pain medication, please  contact your pharmacy. They will contact our office to request authorization. Prescriptions will not be filled after 5 pm or on week-ends. 4. Avoid getting constipated. Between the surgery and the pain medications, it is common to experience some constipation. Increasing fluid intake and taking a fiber supplement (such as Metamucil, Citrucel, FiberCon, MiraLax, etc) 1-2 times a day regularly will usually help prevent this problem from occurring. A mild laxative (prune juice, Milk of Magnesia, MiraLax, etc) should be taken according to package directions if there are no bowel movements after 48 hours.  5. Watch out for diarrhea. If you have many loose bowel movements, simplify your diet to bland foods & liquids for a few days. Stop any stool softeners and decrease your fiber supplement. Switching to mild anti-diarrheal medications (Kayopectate, Pepto Bismol) can help. If this worsens or does not improve, please call us. 6. Wash / shower every day. You may shower over the dressings as they are waterproof. Continue to shower over incision(s) after the dressing is off. 7. Okay to shower but no bathing 8. ACTIVITIES as tolerated:  1. You may resume regular (light) daily activities beginning the next day--such as daily self-care, walking, climbing stairs--gradually increasing activities as tolerated. If you can walk 30 minutes without difficulty, it is safe to try more intense activity such as jogging, treadmill, bicycling, low-impact aerobics, swimming, etc. 2. Save the most intensive and strenuous activity for last such as sit-ups, heavy lifting, contact sports, etc Refrain from any heavy lifting or straining until you are off narcotics for pain control.  3. DO NOT PUSH THROUGH PAIN. Let pain be your guide: If it hurts to do something, don't do it. Pain is your body warning you to avoid that activity for another week until the pain goes down. 4. You may drive when you are no longer taking prescription pain  medication, you can comfortably wear a seatbelt, and you can safely maneuver your car and apply brakes. 5. You may have sexual intercourse when it is comfortable.  9. FOLLOW UP in our office  1. Please call [facility] at [phone] to set up an appointment to see your surgeon in the office for a follow-up appointment approximately 2-3 weeks after your surgery. 2. Make sure that you call for this appointment the day you arrive home to insure a convenient appointment time.      10. IF YOU HAVE DISABILITY OR FAMILY LEAVE FORMS, BRING THEM TO THE               OFFICE FOR PROCESSING.   WHEN TO CALL us [phone]:  1. Poor pain control 2. Reactions / problems with new medications (rash/itching, nausea, etc)  3. Fever over 101.5 F (38.5 C) 4. Inability to urinate 5. Nausea and/or vomiting 6. Worsening swelling or bruising 7. Continued bleeding from incision. 8. Increased pain, redness, or drainage from the incision  The clinic staff is available to answer your questions during regular business hours (8:30am-5pm). Please dont hesitate to call and ask to speak to one of our nurses for clinical concerns.  If you have a medical emergency, go to the nearest emergency room or call 911.  A surgeon from [facility] is always on call at the [facility], [address], [place], [place], [place] [address] ?  MAIN: [phone] ? TOLL FREE: [phone] ?  FAX [phone]  [email or web address]

## 2019-06-18 NOTE — Progress Notes (Signed)
PROGRESS NOTE    LOUCILLE TAKACH  OBS:[id number] DOB: [birth year] DOA: 06/16/2019 PCP: Ladell Pier, MD    Brief Narrative:   Patricia [name] is a 63 y.o. female with medical history significant of anemia, cardiomyopathy, CHF, ESRD on peritoneal dialysis, insulin-dependent type 2 diabetes, hypertension, obesity, cesarean section, hysterectomy presenting to the hospital for evaluation of abdominal pain, nausea, and vomiting.  Patient was seen by gastroenterology today and had an outpatient CT scan performed which showed a strangulated hernia.  History provided by patient and daughter at bedside.  Patient states she was seen in the emergency room 3 weeks ago for abdominal pain and told she had diverticulitis.  She was treated with a course of antibiotics.  She has continued to have severe abdominal pain which is located in the periumbilical region.  Also having nonbloody nonbilious emesis.  Having regular soft bowel movements.  Denies diarrhea, blood in stool, or constipation.  Denies fevers or chills.  She has not noticed any change in the color of the fluid that is removed via peritoneal dialysis.  She still makes urine.  Denies dysuria, urinary frequency, or urgency.  Daughter states that patient has had previous umbilical hernia repair surgery.  Patient she did not take her blood pressure medications today.  No other complaints.  ED Course: Blood pressure elevated, highest recorded reading 210/187.  Afebrile and remainder of vital signs stable.  Labs showing no leukocytosis.  Hemoglobin 9.3, at baseline.  Potassium 3.2.  Lipase and LFTs normal.  Patient received morphine and Benadryl in the ED.  CT abdomen pelvis done earlier today with findings concerning for incarcerated or strangulated umbilical hernia.  ED provider discussed the case with Dr. Rosendo Gros from general surgery who has seen the patient and requesting admission by hospitalist service.  He plans on taking her to the OR tonight, pending  SARS-CoV-2 rapid test result.   Assessment & Plan:   Principal Problem:   Strangulated umbilical hernia Active Problems:   CHF (congestive heart failure) (HCC)   ESRD (end stage renal disease) (HCC)   Hypokalemia   Hypertensive urgency   Umbilical hernia   Strangulated/incarcerated umbilical hernia Patient presenting with severe abdominal pain localized in the periumbilical region nauseated with nausea and vomiting.  CT abdomen/pelvis notable for findings concerning for incarcerated versus strangulated umbilical hernia.  Patient underwent diagnostic laparoscopy on 06/16/2019 by Dr. Rosendo Gros with primary closure of the umbilical hernia. --Tolerating diet, passing flatus --Continue pain control with prn Tramadol/oxy per General surgery --Plan outpatient follow-up with general surgery, Dr. Rosendo Gros in 2-3 weeks  Hypokalemia: Resolved Potassium 3.2 on admission, likely secondary to GI losses from vomiting and decreased oral intake.  Repleted. --Potassium 3.9 this morning. --Continue to monitor electrolytes daily  Hypertensive urgency Blood pressures remain sustained in the upper 180s. --Coreg 25 mg p.o. twice daily, Imdur 50 mg p.o. daily, and Lasix 80 mg p.o. daily --increase hydralazine to 100 mg p.o. BID --Continue monitor blood pressure and adjust accordingly  ESRD on peritoneal dialysis No signs of volume overload. --Nephrology following for continued PD while inpatient --Continue low-volume PD  Insulin-dependent type 2 diabetes mellitus On Lantus 24 units qHS and Novolog 3 units TIDAC at home.  --We will resume Lantus, at 20 units daily --Continue NovoLog insulin sliding scale for coverage --CBGs q. AC/at bedtime  Chronic combined systolic and diastolic congestive heart failure Last echo done February 2020 with left ventricular EF 40 to 47% and diastolic parameters consistent with impaired relaxation. --Continue volume  management with PD per nephrology --Coreg and Lasix 80  mg p.o. daily --Strict I's and O's and daily weights    DVT prophylaxis: SCDs Code Status: Full Code Family Communication: Updated patient's daughter by telephone this morning Disposition Plan: Continue inpatient hospitalization, further dependent on clinical course, anticipate discharge home in 1-2 days   Consultants:   General surgery  Nephrology  Procedures:   Primary closure of umbilical hernia, diagnostic laparoscopy 06/16/2019 - Dr. Rosendo Gros  Antimicrobials:   Preoperative cefazolin  Zosyn 9/4 - 9/5   Subjective: Patient seen and examined at bedside, resting comfortably.  Reporting some mild abdominal pain with PD this morning.  Tolerating diet. No other complaints or concerns at this time.  Denies headache, no fever/chills/night sweats, no nausea some vomiting/diarrhea, no chest pain, palpitations, no shortness of breath, no cough/congestion, no abdominal pain, no weakness, no paresthesias.  No acute events overnight per nursing staff.  Objective: Vitals:   06/18/19 0219 06/18/19 0541 06/18/19 0735 06/18/19 1021  BP: (!) 160/65 (!) 173/80 (!) 161/70 135/71  Pulse: 74 78  68  Resp: 16 13 18 11   Temp:  97.8 F (36.6 C) 97.7 F (36.5 C) 97.7 F (36.5 C)  TempSrc:  Oral    SpO2:  98% 98% 97%  Weight:      Height:        Intake/Output Summary (Last 24 hours) at 06/18/2019 1110 Last data filed at 06/17/2019 2015 Gross per 24 hour  Intake 350 ml  Output --  Net 350 ml   Filed Weights   06/16/19 1952 06/16/19 2353  Weight: 71.6 kg 70.6 kg    Examination:  General exam: Appears calm and comfortable  Respiratory system: Clear to auscultation. Respiratory effort normal. Cardiovascular system: S1 & S2 heard, RRR. No JVD, murmurs, rubs, gallops or clicks. No pedal edema. Gastrointestinal system: Abdomen is nondistended, soft and nontender. No organomegaly or masses felt. Normal bowel sounds heard.  PD catheter noted in place, surgical incision sites noted,  clean/dry/intact Central nervous system: Alert and oriented. No focal neurological deficits. Extremities: Symmetric 5 x 5 power. Skin: No rashes, lesions or ulcers Psychiatry: Judgement and insight appear normal. Mood & affect appropriate.     Data Reviewed: I have personally reviewed following labs and imaging studies  CBC: Recent Labs  Lab 06/16/19 1843 06/17/19 0344 06/18/19 0408  WBC 8.3 8.2 14.5*  HGB 9.3* 9.0* 8.2*  HCT 28.4* 28.1* 25.3*  MCV 95.9 97.6 95.5  PLT 231 193 790   Basic Metabolic Panel: Recent Labs  Lab 06/16/19 1843 06/16/19 2251 06/17/19 0344 06/18/19 0408  NA 138  --  138 137  K 3.2*  --  4.0 3.9  CL 100  --  101 99  CO2 23  --  23 24  GLUCOSE 123*  --  187* 258*  BUN 27*  --  30* 46*  CREATININE 7.36*  --  7.28* 7.42*  CALCIUM 8.5*  --  8.4* 8.1*  MG  --  2.0  --  1.9  PHOS  --   --   --  6.5*   GFR: Estimated Creatinine Clearance: 7.6 mL/min (A) (by C-G formula based on SCr of 7.42 mg/dL (H)). Liver Function Tests: Recent Labs  Lab 06/16/19 1843  AST 15  ALT 15  ALKPHOS 58  BILITOT 0.7  PROT 6.4*  ALBUMIN 2.9*   Recent Labs  Lab 06/16/19 1843  LIPASE 32   No results for input(s): AMMONIA in the last 168 hours.  Coagulation Profile: No results for input(s): INR, PROTIME in the last 168 hours. Cardiac Enzymes: No results for input(s): CKTOTAL, CKMB, CKMBINDEX, TROPONINI in the last 168 hours. BNP (last 3 results) No results for input(s): PROBNP in the last 8760 hours. HbA1C: Recent Labs    06/16/19 2120  HGBA1C 7.0*   CBG: Recent Labs  Lab 06/17/19 0819 06/17/19 1305 06/17/19 1613 06/17/19 2120 06/18/19 0639  GLUCAP 194* 259* 255* 338* 257*   Lipid Profile: No results for input(s): CHOL, HDL, LDLCALC, TRIG, CHOLHDL, LDLDIRECT in the last 72 hours. Thyroid Function Tests: No results for input(s): TSH, T4TOTAL, FREET4, T3FREE, THYROIDAB in the last 72 hours. Anemia Panel: No results for input(s): VITAMINB12,  FOLATE, FERRITIN, TIBC, IRON, RETICCTPCT in the last 72 hours. Sepsis Labs: Recent Labs  Lab 06/16/19 2107  LATICACIDVEN 0.8    Recent Results (from the past 240 hour(s))  SARS Coronavirus 2 [facility] order, Performed in [facility] hospital lab) Nasopharyngeal Nasopharyngeal Swab     Status: None   Collection Time: 06/16/19  7:50 PM   Specimen: Nasopharyngeal Swab  Result Value Ref Range Status   SARS Coronavirus 2 NEGATIVE NEGATIVE Final    Comment: (NOTE) If result is NEGATIVE SARS-CoV-2 target nucleic acids are NOT DETECTED. The SARS-CoV-2 RNA is generally detectable in upper and lower  respiratory specimens during the acute phase of infection. The lowest  concentration of SARS-CoV-2 viral copies this assay can detect is 250  copies / mL. A negative result does not preclude SARS-CoV-2 infection  and should not be used as the sole basis for treatment or other  patient management decisions.  A negative result may occur with  improper specimen collection / handling, submission of specimen other  than nasopharyngeal swab, presence of viral mutation(s) within the  areas targeted by this assay, and inadequate number of viral copies  (<250 copies / mL). A negative result must be combined with clinical  observations, patient history, and epidemiological information. If result is POSITIVE SARS-CoV-2 target nucleic acids are DETECTED. The SARS-CoV-2 RNA is generally detectable in upper and lower  respiratory specimens dur ing the acute phase of infection.  Positive  results are indicative of active infection with SARS-CoV-2.  Clinical  correlation with patient history and other diagnostic information is  necessary to determine patient infection status.  Positive results do  not rule out bacterial infection or co-infection with other viruses. If result is PRESUMPTIVE POSTIVE SARS-CoV-2 nucleic acids MAY BE PRESENT.   A presumptive positive result was obtained on the submitted specimen   and confirmed on repeat testing.  While 2019 novel coronavirus  (SARS-CoV-2) nucleic acids may be present in the submitted sample  additional confirmatory testing may be necessary for epidemiological  and / or clinical management purposes  to differentiate between  SARS-CoV-2 and other Sarbecovirus currently known to infect humans.  If clinically indicated additional testing with an alternate test  methodology [phone]) is advised. The SARS-CoV-2 RNA is generally  detectable in upper and lower respiratory sp ecimens during the acute  phase of infection. The expected result is Negative. Fact Sheet for Patients:  StrictlyIdeas.no Fact Sheet for Healthcare Providers: BankingDealers.co.za This test is not yet approved or cleared by the [place] FDA and has been authorized for detection and/or diagnosis of SARS-CoV-2 by FDA under an Emergency Use Authorization (EUA).  This EUA will remain in effect (meaning this test can be used) for the duration of the COVID-19 declaration under Section 564(b)(1) of the Act, 21  U.S.C. section 360bbb-3(b)(1), unless the authorization is terminated or revoked sooner. Performed at [facility], [place] [address] [place], [place] [address]          Radiology Studies: Ct Abdomen Wo Contrast  Result Date: 06/16/2019 CLINICAL DATA:  Severe epigastric abdominal pain radiating into umbilical area and left side, recently treated for diverticulitis, peritoneal dialysis EXAM: CT ABDOMEN WITHOUT CONTRAST TECHNIQUE: Multidetector CT imaging of the abdomen was performed following the standard protocol without IV contrast. COMPARISON:  CT abdomen pelvis, 05/25/2019 FINDINGS: Lower chest: No acute abnormality. Hepatobiliary: No solid liver abnormality is seen. No gallstones, gallbladder wall thickening, or biliary dilatation. Pancreas: Unremarkable. No pancreatic ductal dilatation or surrounding inflammatory  changes. Spleen: Normal in size without significant abnormality. Adrenals/Urinary Tract: Adrenal glands are unremarkable. Kidneys are normal, without renal calculi, solid lesion, or hydronephrosis. Bladder is unremarkable. Stomach/Bowel: Stomach is within normal limits. The sigmoid colon is incompletely imaged, the most proximal portion is included at the lower extent of the examination and appears thickened, with severe diverticulosis. Vascular/Lymphatic: Aortic atherosclerosis. No enlarged abdominal lymph nodes. Other: There is a small, fat, small bowel, and fluid containing umbilical hernia, which is incompletely imaged although contains a single, edematous appearing loop of mid small bowel without proximal evidence of obstruction. Measured portion of the hernia sac measures approximately 5.2 cm with a neck measuring 2.6 cm (series 2, image 53). There is a partially imaged peritoneal dialysis catheter in the soft tissues of the ventral abdomen, the intra-abdominal portion of the catheter is not imaged. Small volume ascites, in keeping with peritoneal dialysate. Musculoskeletal: No acute or significant osseous findings. IMPRESSION: 1. There is a small, fat, small bowel, and fluid containing umbilical hernia, which is incompletely imaged although contains a single, edematous appearing loop of mid small bowel without proximal evidence of obstruction. Measured portion of the hernia sac measures approximately 5.2 cm with a neck measuring 2.6 cm (series 2, image 53). This has increased in size when compared to prior examination, with new involvement of the bowel, and findings are concerning for incarcerated or strangulated hernia. 2. The sigmoid colon is incompletely imaged, the most proximal portion is included at the lower extent of the examination and appears thickened, with severe diverticulosis. Findings are in keeping with diverticulitis identified on prior examination although complete imaging of the sigmoid  colon would be helpful to assess for complications of diverticulitis if suspected based on persistent symptoms. 3. Small volume ascites, in keeping with peritoneal dialysate. There is a partially imaged peritoneal dialysis catheter in the soft tissues of the ventral abdomen, the intra-abdominal portion of the catheter is not imaged. These results will be called to the ordering clinician or representative by the Radiologist Assistant, and communication documented in the PACS or zVision Dashboard. Electronically Signed   By: Eddie Candle M.D.   On: 06/16/2019 16:38        Scheduled Meds:  atorvastatin  10 mg Oral Daily   carvedilol  25 mg Oral BID WC   enoxaparin (LOVENOX) injection  30 mg Subcutaneous Q24H   escitalopram  20 mg Oral QHS   furosemide  80 mg Oral Daily   gentamicin cream  1 application Topical Daily   hydrALAZINE  100 mg Oral BID   insulin aspart  0-9 Units Subcutaneous TID WC   isosorbide mononitrate  15 mg Oral Daily   sevelamer carbonate  1,600 mg Oral TID WC   sodium chloride flush  3 mL Intravenous Once   Continuous  Infusions:    LOS: 2 days    Time spent: 32 minutes spent on chart review, discussion with nursing staff, consultants, updating family and interview/physical exam; more than 50% of that time was spent in counseling and/or coordination of care.    Fatumata Kashani J [place] DO [facility] Pager [phone]  If 7PM-7AM, please contact night-coverage [email or web address] Password [email or web address] 06/18/2019, 11:10 AM

## 2019-06-19 LAB — GLUCOSE, CAPILLARY
Glucose-Capillary: 130 mg/dL — ABNORMAL HIGH (ref 70–99)
Glucose-Capillary: 189 mg/dL — ABNORMAL HIGH (ref 70–99)
Glucose-Capillary: 104 mg/dL — ABNORMAL HIGH (ref 70–99)
Glucose-Capillary: 154 mg/dL — ABNORMAL HIGH (ref 70–99)

## 2019-06-19 LAB — BASIC METABOLIC PANEL
Anion gap: 12 (ref 5–15)
BUN: 51 mg/dL — ABNORMAL HIGH (ref 6–20)
CO2: 25 mmol/L (ref 22–32)
Calcium: 7.8 mg/dL — ABNORMAL LOW (ref 8.9–10.3)
Chloride: 98 mmol/L (ref 98–111)
Creatinine, Ser: 7.75 mg/dL — ABNORMAL HIGH (ref 0.44–1.00)
GFR calc Af Amer: 6 mL/min — ABNORMAL LOW (ref >60)
GFR calc non Af Amer: 5 mL/min — ABNORMAL LOW (ref >60)
Glucose, Bld: 206 mg/dL — ABNORMAL HIGH (ref 70–99)
Potassium: 3.4 mmol/L — ABNORMAL LOW (ref 3.5–5.1)
Sodium: 135 mmol/L (ref 135–145)

## 2019-06-19 MED ORDER — DIPHENHYDRAMINE HCL 25 MG PO CAPS: 25.0000 mg | ORAL_CAPSULE | Freq: Four times a day (QID) | ORAL | Status: DC | PRN

## 2019-06-19 MED ORDER — DARBEPOETIN ALFA 60 MCG/0.3ML IJ SOSY
60.0000 ug | PREFILLED_SYRINGE | Freq: Once | INTRAMUSCULAR | Status: AC
  Administered 2019-06-19: 60 ug via SUBCUTANEOUS
  Filled 2019-06-19: qty 0.3

## 2019-06-19 MED ORDER — POLYETHYLENE GLYCOL 3350 17 G PO PACK
17.0000 g | PACK | Freq: Every day | ORAL | Status: DC
  Administered 2019-06-19 – 2019-06-20 (×2): 17 g via ORAL
  Filled 2019-06-19 (×2): qty 1

## 2019-06-19 MED ORDER — POTASSIUM CHLORIDE CRYS ER 20 MEQ PO TBCR
30.0000 meq | EXTENDED_RELEASE_TABLET | Freq: Once | ORAL | Status: AC
  Administered 2019-06-19: 30 meq via ORAL
  Filled 2019-06-19: qty 1

## 2019-06-19 NOTE — Progress Notes (Signed)
Pt complained of abdominal discomfort , stated it doesn't seem like surgical pain. Said she has to urinate but was unable to go. peritoneal dialysis was stopped due to issue with pt side blockage. Bladder scan showed >728 ml.  0500: paged on call NP X Blount requesting I/o cath and also notified PD had to stop via text page.  0510: received order for in and out cath.  1239: I/o cath completed returning 900 ml. Bladder scan post I/o cath was 0 ml.  3594: I paged X blount NP back and notified via text page about I/o cath, and said will notify Nephrology about stopping PD, and no HD RN available at this time. Will continue to monitor.

## 2019-06-19 NOTE — Progress Notes (Signed)
Hemo RN by bedside. HE said he will get in touch with Nephrologist.

## 2019-06-19 NOTE — Progress Notes (Signed)
[facility] Progress Note  Subjective:   Machine malfunction overnight, initial portion of treatment with no issues regarding inflow and outflow  Still with some abdominal discomfort  Stable labs, K3.4, bicarbonate 25, BUN 51  Vitals:   06/19/19 0615 06/19/19 0624 06/19/19 0734 06/19/19 1107  BP: (!) 184/84 (!) 184/84 (!) 144/68 (!) 151/68  Pulse: 72  77 74  Resp: 14  15 13   Temp: 98.2 F (36.8 C)  98.4 F (36.9 C) 97.7 F (36.5 C)  TempSrc: Oral  Oral Oral  SpO2:   98% 100%  Weight: 73.8 kg     Height:        Inpatient medications: . atorvastatin  10 mg Oral Daily  . carvedilol  25 mg Oral BID WC  . enoxaparin (LOVENOX) injection  30 mg Subcutaneous Q24H  . escitalopram  20 mg Oral QHS  . furosemide  80 mg Oral Daily  . gentamicin cream  1 application Topical Daily  . hydrALAZINE  100 mg Oral BID  . insulin aspart  0-9 Units Subcutaneous TID WC  . insulin glargine  20 Units Subcutaneous Daily  . isosorbide mononitrate  15 mg Oral Daily  . polyethylene glycol  17 g Oral Daily  . sevelamer carbonate  1,600 mg Oral TID WC  . sodium chloride flush  3 mL Intravenous Once   . dialysis solution 1.5% low-MG/low-CA     acetaminophen **OR** acetaminophen, diphenhydrAMINE, dianeal solution for CAPD/CCPD with heparin, hydrALAZINE, morphine injection, ondansetron **OR** ondansetron (ZOFRAN) IV, oxyCODONE, traMADol    Exam: Gen alert, no distress Chest clear bilat to bases, no rales or wheezing RRR no MRG Abd soft ntnd no mass or ascites, RLQ PD cath intact, L abd stab wounds are dry, +BS, mildly tender diffusely Ext no LE or UE edema, no wounds or ulcers Neuro is alert, Ox 3 , nf    Home meds:  - aspirin 81/ atorvastatin 10 qd/ isosorbide mononitrate 15 qd  - hydralazine 50 bid/ furosemide 80 qd/ carvedilol 25 bid  - insulin glargine 24u hs/ insulin aspart   - gabapentin 100 hs/ escitalopram 20 hs  - sevelamer 2-3 ac tid  - prn's/ vitamins/  supplements    Outpt HD: PD 5 exchanges, 2.4 L fill volume, 1.5 hr dwells, no last fill or day exchange  Low - volume regimen here > 8 exchanges, 500 cc volume , 1 hr dwell time   Assessment/ Plan: 1. Incarc umbilical hernia - sp repair CCS 9/4.   2. ESRD - pt on PD for about 6 mos. attempting to continue on PD and post operative period.  Continue supine low volume PD, can increase fill volume to 1 L, reduced number of exchanges.  Her significant R RF is very beneficial here.  Labs are stable.  Not uremic.   3. VOlume - euvolemic on exam 4. HTN - is getting her home meds here 5. MBD ckd - cont binder 6. DM2 7. Anemia, recent decline not surprising, continue to trend.  ESA today   [facility] 06/19/2019, 11:19 AM  Iron/TIBC/Ferritin/ %Sat    Component Value Date/Time   IRON 30 10/25/2018 0836   TIBC 252 10/25/2018 0836   FERRITIN 252 10/25/2018 0836   IRONPCTSAT 12 10/25/2018 0836   IRONPCTSAT 13 10/13/2016 0831   Recent Labs  Lab 06/16/19 1843  06/18/19 0408 06/19/19 0040  NA 138   < > 137 135  K 3.2*   < > 3.9  3.4*  CL 100   < > 99 98  CO2 23   < > 24 25  GLUCOSE 123*   < > 258* 206*  BUN 27*   < > 46* 51*  CREATININE 7.36*   < > 7.42* 7.75*  CALCIUM 8.5*   < > 8.1* 7.8*  PHOS  --   --  6.5*  --   ALBUMIN 2.9*  --   --   --    < > = values in this interval not displayed.   Recent Labs  Lab 06/16/19 1843  AST 15  ALT 15  ALKPHOS 58  BILITOT 0.7  PROT 6.4*   Recent Labs  Lab 06/18/19 0408  WBC 14.5*  HGB 8.2*  HCT 25.3*  PLT 210

## 2019-06-19 NOTE — Care Management Important Message (Signed)
Important Message  Patient Details  Name: Patricia [name] MRN: [id number] Date of Birth: 21-Sep-1956   Medicare Important Message Given:  Yes     Patricia [name] 06/19/2019, 3:33 PM

## 2019-06-19 NOTE — Progress Notes (Signed)
PROGRESS NOTE    Patricia [name]  QMG:[id number] DOB: 1956-05-30 DOA: 06/16/2019 PCP: Ladell Pier, MD    Brief Narrative:   Patricia [name] is a 63 y.o. female with medical history significant of anemia, cardiomyopathy, CHF, ESRD on peritoneal dialysis, insulin-dependent type 2 diabetes, hypertension, obesity, cesarean section, hysterectomy presenting to the hospital for evaluation of abdominal pain, nausea, and vomiting.  Patient was seen by gastroenterology today and had an outpatient CT scan performed which showed a strangulated hernia.  History provided by patient and daughter at bedside.  Patient states she was seen in the emergency room 3 weeks ago for abdominal pain and told she had diverticulitis.  She was treated with a course of antibiotics.  She has continued to have severe abdominal pain which is located in the periumbilical region.  Also having nonbloody nonbilious emesis.  Having regular soft bowel movements.  Denies diarrhea, blood in stool, or constipation.  Denies fevers or chills.  She has not noticed any change in the color of the fluid that is removed via peritoneal dialysis.  She still makes urine.  Denies dysuria, urinary frequency, or urgency.  Daughter states that patient has had previous umbilical hernia repair surgery.  Patient she did not take her blood pressure medications today.  No other complaints.  ED Course: Blood pressure elevated, highest recorded reading 210/187.  Afebrile and remainder of vital signs stable.  Labs showing no leukocytosis.  Hemoglobin 9.3, at baseline.  Potassium 3.2.  Lipase and LFTs normal.  Patient received morphine and Benadryl in the ED.  CT abdomen pelvis done earlier today with findings concerning for incarcerated or strangulated umbilical hernia.  ED provider discussed the case with Dr. Rosendo Gros from general surgery who has seen the patient and requesting admission by hospitalist service.  He plans on taking her to the OR tonight, pending  SARS-CoV-2 rapid test result.   Assessment & Plan:   Principal Problem:   Strangulated umbilical hernia Active Problems:   CHF (congestive heart failure) (HCC)   ESRD (end stage renal disease) (HCC)   Hypokalemia   Hypertensive urgency   Umbilical hernia   Strangulated/incarcerated umbilical hernia Patient presenting with severe abdominal pain localized in the periumbilical region nauseated with nausea and vomiting.  CT abdomen/pelvis notable for findings concerning for incarcerated versus strangulated umbilical hernia.  Patient underwent diagnostic laparoscopy on 06/16/2019 by Dr. Rosendo Gros with primary closure of the umbilical hernia. --Tolerating diet, passing flatus, no bowel movement yet --Continue pain control with prn Tramadol/oxy per General surgery --Plan outpatient follow-up with general surgery, Dr. Rosendo Gros in 2-3 weeks  Hypokalemia: Resolved Potassium 3.2 on admission, likely secondary to GI losses from vomiting and decreased oral intake.  Repleted. --Potassium 3.4 this morning, will replete --Continue to monitor electrolytes daily  Hypertensive urgency Blood pressures remain sustained in the upper 180s. --Coreg 25 mg p.o. BID, Imdur 50 mg p.o. daily, Lasix 80 mg p.o. daily, hydralazine 100 mg p.o. BID --Continue monitor blood pressure and adjust accordingly  ESRD on peritoneal dialysis No signs of volume overload.  Issue with PD machine overnight. --Nephrology following for continued PD while inpatient, increasing fill volume with decreased exchanges today --Continue low-volume PD  Insulin-dependent type 2 diabetes mellitus On Lantus 24 units qHS and Novolog 3 units TIDAC at home.  --continue Lantus, at 20 units daily --Continue NovoLog insulin sliding scale for coverage --CBGs q. AC/at bedtime  Chronic combined systolic and diastolic congestive heart failure Last echo done February 2020 with left ventricular  EF 40 to 93% and diastolic parameters consistent with  impaired relaxation. --Continue volume management with PD per nephrology --Coreg and Lasix 80 mg p.o. daily --Strict I's and O's and daily weights  Urinary retention Bladder scan overnight with greater than 720 mL of retained urine.  Status post in and out catheterization --Continue to monitor bladder scan postvoid or if no void within 6 hours. --PT for mobilization    DVT prophylaxis: SCDs Code Status: Full Code Family Communication: Updated patient's daughter by telephone this morning Disposition Plan: Continue inpatient hospitalization, further dependent on clinical course, anticipate discharge home in 1-2 days   Consultants:   General surgery  Nephrology  Procedures:   Primary closure of umbilical hernia, diagnostic laparoscopy 06/16/2019 - Dr. Rosendo Gros  Antimicrobials:   Preoperative cefazolin  Zosyn 9/4 - 9/5   Subjective: Patient seen and examined at bedside, resting comfortably.  Issue with urinary retention last night status post in and out catheterization.  Also with issues regarding her PD machine, now corrected by nephrology.  Tolerating diet, passing flatus.  No bowel movement as of yet.  Denies headache, no fever/chills/night sweats, no nausea some vomiting/diarrhea, no chest pain, palpitations, no shortness of breath, no cough/congestion, no abdominal pain, no weakness, no paresthesias.  No acute events overnight per nursing staff.  Objective: Vitals:   06/19/19 0615 06/19/19 0624 06/19/19 0734 06/19/19 1107  BP: (!) 184/84 (!) 184/84 (!) 144/68 (!) 151/68  Pulse: 72  77 74  Resp: 14  15 13   Temp: 98.2 F (36.8 C)  98.4 F (36.9 C) 97.7 F (36.5 C)  TempSrc: Oral  Oral Oral  SpO2:   98% 100%  Weight: 73.8 kg     Height:        Intake/Output Summary (Last 24 hours) at 06/19/2019 1146 Last data filed at 06/19/2019 0900 Gross per 24 hour  Intake 300 ml  Output --  Net 300 ml   Filed Weights   06/16/19 1952 06/16/19 2353 06/19/19 0615  Weight: 71.6  kg 70.6 kg 73.8 kg    Examination:  General exam: Appears calm and comfortable  Respiratory system: Clear to auscultation. Respiratory effort normal. Cardiovascular system: S1 & S2 heard, RRR. No JVD, murmurs, rubs, gallops or clicks. No pedal edema. Gastrointestinal system: Abdomen is nondistended, soft and nontender. No organomegaly or masses felt. Normal bowel sounds heard.  PD catheter noted in place, surgical incision sites noted, clean/dry/intact Central nervous system: Alert and oriented. No focal neurological deficits. Extremities: Symmetric 5 x 5 power. Skin: No rashes, lesions or ulcers Psychiatry: Judgement and insight appear normal. Mood & affect appropriate.     Data Reviewed: I have personally reviewed following labs and imaging studies  CBC: Recent Labs  Lab 06/16/19 1843 06/17/19 0344 06/18/19 0408  WBC 8.3 8.2 14.5*  HGB 9.3* 9.0* 8.2*  HCT 28.4* 28.1* 25.3*  MCV 95.9 97.6 95.5  PLT 231 193 790   Basic Metabolic Panel: Recent Labs  Lab 06/16/19 1843 06/16/19 2251 06/17/19 0344 06/18/19 0408 06/19/19 0040  NA 138  --  138 137 135  K 3.2*  --  4.0 3.9 3.4*  CL 100  --  101 99 98  CO2 23  --  23 24 25   GLUCOSE 123*  --  187* 258* 206*  BUN 27*  --  30* 46* 51*  CREATININE 7.36*  --  7.28* 7.42* 7.75*  CALCIUM 8.5*  --  8.4* 8.1* 7.8*  MG  --  2.0  --  1.9  --   PHOS  --   --   --  6.5*  --    GFR: Estimated Creatinine Clearance: 7.4 mL/min (A) (by C-G formula based on SCr of 7.75 mg/dL (H)). Liver Function Tests: Recent Labs  Lab 06/16/19 1843  AST 15  ALT 15  ALKPHOS 58  BILITOT 0.7  PROT 6.4*  ALBUMIN 2.9*   Recent Labs  Lab 06/16/19 1843  LIPASE 32   No results for input(s): AMMONIA in the last 168 hours. Coagulation Profile: No results for input(s): INR, PROTIME in the last 168 hours. Cardiac Enzymes: No results for input(s): CKTOTAL, CKMB, CKMBINDEX, TROPONINI in the last 168 hours. BNP (last 3 results) No results for  input(s): PROBNP in the last 8760 hours. HbA1C: Recent Labs    06/16/19 2120  HGBA1C 7.0*   CBG: Recent Labs  Lab 06/18/19 1121 06/18/19 1635 06/18/19 2111 06/19/19 0619 06/19/19 1105  GLUCAP 189* 245* 251* 130* 189*   Lipid Profile: No results for input(s): CHOL, HDL, LDLCALC, TRIG, CHOLHDL, LDLDIRECT in the last 72 hours. Thyroid Function Tests: No results for input(s): TSH, T4TOTAL, FREET4, T3FREE, THYROIDAB in the last 72 hours. Anemia Panel: No results for input(s): VITAMINB12, FOLATE, FERRITIN, TIBC, IRON, RETICCTPCT in the last 72 hours. Sepsis Labs: Recent Labs  Lab 06/16/19 2107  LATICACIDVEN 0.8    Recent Results (from the past 240 hour(s))  SARS Coronavirus 2 [facility] order, Performed in [facility] hospital lab) Nasopharyngeal Nasopharyngeal Swab     Status: None   Collection Time: 06/16/19  7:50 PM   Specimen: Nasopharyngeal Swab  Result Value Ref Range Status   SARS Coronavirus 2 NEGATIVE NEGATIVE Final    Comment: (NOTE) If result is NEGATIVE SARS-CoV-2 target nucleic acids are NOT DETECTED. The SARS-CoV-2 RNA is generally detectable in upper and lower  respiratory specimens during the acute phase of infection. The lowest  concentration of SARS-CoV-2 viral copies this assay can detect is 250  copies / mL. A negative result does not preclude SARS-CoV-2 infection  and should not be used as the sole basis for treatment or other  patient management decisions.  A negative result may occur with  improper specimen collection / handling, submission of specimen other  than nasopharyngeal swab, presence of viral mutation(s) within the  areas targeted by this assay, and inadequate number of viral copies  (<250 copies / mL). A negative result must be combined with clinical  observations, patient history, and epidemiological information. If result is POSITIVE SARS-CoV-2 target nucleic acids are DETECTED. The SARS-CoV-2 RNA is generally detectable in upper and  lower  respiratory specimens dur ing the acute phase of infection.  Positive  results are indicative of active infection with SARS-CoV-2.  Clinical  correlation with patient history and other diagnostic information is  necessary to determine patient infection status.  Positive results do  not rule out bacterial infection or co-infection with other viruses. If result is PRESUMPTIVE POSTIVE SARS-CoV-2 nucleic acids MAY BE PRESENT.   A presumptive positive result was obtained on the submitted specimen  and confirmed on repeat testing.  While 2019 novel coronavirus  (SARS-CoV-2) nucleic acids may be present in the submitted sample  additional confirmatory testing may be necessary for epidemiological  and / or clinical management purposes  to differentiate between  SARS-CoV-2 and other Sarbecovirus currently known to infect humans.  If clinically indicated additional testing with an alternate test  methodology [phone]) is advised. The SARS-CoV-2 RNA is generally  detectable in  upper and lower respiratory sp ecimens during the acute  phase of infection. The expected result is Negative. Fact Sheet for Patients:  StrictlyIdeas.no Fact Sheet for Healthcare Providers: BankingDealers.co.za This test is not yet approved or cleared by the [place] FDA and has been authorized for detection and/or diagnosis of SARS-CoV-2 by FDA under an Emergency Use Authorization (EUA).  This EUA will remain in effect (meaning this test can be used) for the duration of the COVID-19 declaration under Section 564(b)(1) of the Act, 21 U.S.C. section 360bbb-3(b)(1), unless the authorization is terminated or revoked sooner. Performed at [facility], [place] [address], [place],  [address]          Radiology Studies: No results found.      Scheduled Meds:  atorvastatin  10 mg Oral Daily   carvedilol  25 mg Oral BID WC   darbepoetin  (ARANESP) injection - NON-DIALYSIS  60 mcg Subcutaneous Once   enoxaparin (LOVENOX) injection  30 mg Subcutaneous Q24H   escitalopram  20 mg Oral QHS   furosemide  80 mg Oral Daily   gentamicin cream  1 application Topical Daily   hydrALAZINE  100 mg Oral BID   insulin aspart  0-9 Units Subcutaneous TID WC   insulin glargine  20 Units Subcutaneous Daily   isosorbide mononitrate  15 mg Oral Daily   polyethylene glycol  17 g Oral Daily   sevelamer carbonate  1,600 mg Oral TID WC   sodium chloride flush  3 mL Intravenous Once   Continuous Infusions:  dialysis solution 1.5% low-MG/low-CA       LOS: 3 days    Time spent: 32 minutes spent on chart review, discussion with nursing staff, consultants, updating family and interview/physical exam; more than 50% of that time was spent in counseling and/or coordination of care.    Yosmar Ryker J [place] DO [facility] Pager [phone]  If 7PM-7AM, please contact night-coverage [email or web address] Password [email or web address] 06/19/2019, 11:46 AM

## 2019-06-19 NOTE — Progress Notes (Signed)
Called to patient room saying peritoneal dialysis machine is not working. When entered the room, screen showed patient side blockage. I checked all the tubing, no kink was seen. I tried to call dialysis multiple times, nobody answered the phone. Got in touch with house [facility], he said to call Renal. I called renal floor, they said there is no one in dialysis till 0630 in the morning, and suggested me to call service line.  I called service line [phone] number around 0430 talked to Argie Ramming. Followed her instruction, double checked all the lines, even did troubleshoot the machine but was unable to resume the dialysis. No  PD RN available at this time. Had to stop the dialysis. Pt's PD cathter clamped per instruction. Pt is complaining of abdominal pain and urge to urinate. Will keep trying calling dialysis and will inform MD. When I stopped PD pt was on cycle 5 out of 8. Expected drain volume was 497 ml and total volume drain was 494 ml. Will continue to monitor.

## 2019-06-19 NOTE — Plan of Care (Signed)
Poc progressing.  

## 2019-06-19 NOTE — Evaluation (Signed)
Physical Therapy Evaluation Patient Details Name: Patricia [name] MRN: [id number] DOB: [birth year] Today's Date: 06/19/2019   History of Present Illness  Patient is a 63 y/o female who presents with abdominal pain, nause and vomiting, s/p diagnostic laparoscopy on 06/16/2019 with primary closure of the umbilical hernia. CT abdomen/pelvis notable for findings concerning for incarcerated versus strangulated umbilical hernia. PMH includes DM, HTN, CKD, depression,  cardiomyopathy, ESRD on peritoneal dialysis, anxiety, CHF.  Clinical Impression  Patient presents with lethargy, impaired balance and impaired mobility s/p above. Pt Mod I PTA and uses SPC as needed, lives with daughter and is independent with ADLs. Today, pt requires min guard-Min A for gait training, reaching for rail at times for support. Pt recently given pain medication and was awoken from sleep. Reports pain is controlled by medicine. VSS throughout. Anticipate pt's mobility and balance will improve with increased activity and when more alert and awake. Will follow acutely to maximize independence and mobility prior to return home.      Follow Up Recommendations No PT follow up;Supervision - Intermittent    Equipment Recommendations  None recommended by PT    Recommendations for Other Services       Precautions / Restrictions Precautions Precautions: Fall Restrictions Weight Bearing Restrictions: No      Mobility  Bed Mobility Overal bed mobility: Modified Independent             General bed mobility comments: No assist needed, HOB elevated.  Transfers Overall transfer level: Needs assistance Equipment used: None Transfers: Sit to/from Stand Sit to Stand: Min guard         General transfer comment: Min guard for safety. Stood from [employer].  Ambulation/Gait Ambulation/Gait assistance: Min assist;Min guard Gait Distance (Feet): 60 Feet Assistive device: None Gait Pattern/deviations: Step-through  pattern;Decreased stride length;Staggering right;Staggering left;Step-to pattern Gait velocity: decreased   General Gait Details: Slow, unsteady gait with staggering noted in both directions; step to and step through gait varying. Reaching for rail for support, Min A at times. Lethargic  Stairs            Wheelchair Mobility    Modified Rankin (Stroke Patients Only)       Balance Overall balance assessment: Needs assistance Sitting-balance support: Feet supported;No upper extremity supported Sitting balance-Leahy Scale: Good     Standing balance support: During functional activity Standing balance-Leahy Scale: Fair                               Pertinent Vitals/Pain Pain Assessment: Faces Faces Pain Scale: Hurts a little bit Pain Location: abdomen Pain Descriptors / Indicators: Aching Pain Intervention(s): Premedicated before session;Monitored during session    Home Living Family/patient expects to be discharged to:: Private residence Living Arrangements: Children Available Help at Discharge: Family;Available PRN/intermittently Type of Home: Apartment Home Access: Level entry     Home Layout: One level Home Equipment: Cane - single point      Prior Function Level of Independence: Independent with assistive device(s)   Gait / Transfers Assistance Needed: USes SPC as needed.  ADL's / Homemaking Assistance Needed: Does her own ADLs. has not driven in awhile. Does peritoneal dialysis at home.  Comments: Daughter works at [employer]        Extremity/Trunk Assessment   Upper Extremity Assessment Upper Extremity Assessment: Defer to OT evaluation    Lower Extremity Assessment Lower Extremity Assessment: Overall [facility]  for tasks assessed(Grossly ~4/5 throughout)    Cervical / Trunk Assessment Cervical / Trunk Assessment: Normal  Communication   Communication: No difficulties  Cognition Arousal/Alertness:  Lethargic;Suspect due to medications Behavior During Therapy: Flat affect Overall Cognitive Status: Within Functional Limits for tasks assessed                                 General Comments: Appears WFL for basic mobility tasks. Requires repetition due to [facility].      General Comments General comments (skin integrity, edema, etc.): VSS throughout    Exercises     Assessment/Plan    PT Assessment Patient needs continued PT services  PT Problem List Decreased mobility;Pain;Decreased balance       PT Treatment Interventions Therapeutic activities;Gait training;Therapeutic exercise;Patient/family education;Balance training;Functional mobility training;DME instruction    PT Goals (Current goals can be found in the Care Plan section)  Acute Rehab PT Goals Patient Stated Goal: to get back to driving PT Goal Formulation: With patient Time For Goal Achievement: 07/03/19 Potential to Achieve Goals: Good    Frequency Min 3X/week   Barriers to discharge Decreased caregiver support      Co-evaluation               AM-PAC PT "6 Clicks" Mobility  Outcome Measure Help needed turning from your back to your side while in a flat bed without using bedrails?: None Help needed moving from lying on your back to sitting on the side of a flat bed without using bedrails?: None Help needed moving to and from a bed to a chair (including a wheelchair)?: A Little Help needed standing up from a chair using your arms (e.g., wheelchair or bedside chair)?: A Little Help needed to walk in hospital room?: A Little Help needed climbing 3-5 steps with a railing? : A Little 6 Click Score: 20    End of Session Equipment Utilized During Treatment: Gait belt Activity Tolerance: Patient tolerated treatment well Patient left: in bed;with call bell/phone within reach;with bed alarm set Nurse Communication: Mobility status PT Visit Diagnosis: Pain;Unsteadiness on feet (R26.81) Pain - part  of body: (abdomen)    Time: 6440-3474 PT Time Calculation (min) (ACUTE ONLY): 19 min   Charges:   PT Evaluation $PT Eval Moderate Complexity: 1 Mod          Wray Kearns, PT, DPT Acute Rehabilitation Services Pager [phone] Office [phone]      Marguarite Arbour A Sabra Heck 06/19/2019, 3:25 PM

## 2019-06-19 NOTE — Progress Notes (Signed)
Called Hemodialysis again before paging Nephrology. Talked to [facility], she said she just came in. Asked her if anyone could come and look at pt's PD machine, and notified I had called 1800 number service line and tried to troubleshoot but didn't work. She said will send somebody around 0730. Will continue to monitor.

## 2019-06-19 NOTE — Progress Notes (Signed)
Paged on call MD Roney Jaffe and notified PD had to stop d/t some issue with patient side blockage around 0400, and notified Hemo wont be able to get here before 0730. Waiting for call back.

## 2019-06-20 LAB — GLUCOSE, CAPILLARY
Glucose-Capillary: 251 mg/dL — ABNORMAL HIGH (ref 70–99)
Glucose-Capillary: 61 mg/dL — ABNORMAL LOW (ref 70–99)
Glucose-Capillary: 95 mg/dL (ref 70–99)

## 2019-06-20 LAB — BASIC METABOLIC PANEL
Anion gap: 11 (ref 5–15)
BUN: 50 mg/dL — ABNORMAL HIGH (ref 6–20)
CO2: 25 mmol/L (ref 22–32)
Calcium: 7.9 mg/dL — ABNORMAL LOW (ref 8.9–10.3)
Chloride: 99 mmol/L (ref 98–111)
Creatinine, Ser: 8.2 mg/dL — ABNORMAL HIGH (ref 0.44–1.00)
GFR calc Af Amer: 6 mL/min — ABNORMAL LOW (ref >60)
GFR calc non Af Amer: 5 mL/min — ABNORMAL LOW (ref >60)
Glucose, Bld: 66 mg/dL — ABNORMAL LOW (ref 70–99)
Potassium: 3.6 mmol/L (ref 3.5–5.1)
Sodium: 135 mmol/L (ref 135–145)

## 2019-06-20 LAB — MAGNESIUM: Magnesium: 1.8 mg/dL (ref 1.7–2.4)

## 2019-06-20 MED ORDER — HYDRALAZINE HCL 100 MG PO TABS: 100.0000 mg | ORAL_TABLET | Freq: Two times a day (BID) | ORAL | 0 refills | Status: DC

## 2019-06-20 MED ORDER — OXYCODONE HCL 5 MG PO TABS: 5.0000 mg | ORAL_TABLET | Freq: Four times a day (QID) | ORAL | 0 refills | Status: AC | PRN

## 2019-06-20 MED ORDER — INSULIN GLARGINE 100 UNIT/ML ~~LOC~~ SOLN
15.0000 [IU] | Freq: Every day | SUBCUTANEOUS | Status: DC
  Administered 2019-06-20: 15 [IU] via SUBCUTANEOUS
  Filled 2019-06-20: qty 0.15

## 2019-06-20 NOTE — Progress Notes (Signed)
06/20/2019 12:19 PM Discharge AVS meds taken today and those due this evening reviewed.  Follow-up appointments and when to call md reviewed.  D/C IV and TELE.  Questions and concerns addressed.   D/C home per orders. [place]

## 2019-06-20 NOTE — Progress Notes (Signed)
[facility] Progress Note  Subjective:   No issues overnight with 5 clear, good for 1 L exchanges, 60 minutes each  Patient without complaints or concerns, ready for home  Discussed status with home therapies nurse, she will arrange/coordinate change in prescription  Labs ok  Vitals:   06/20/19 0003 06/20/19 0616 06/20/19 0646 06/20/19 0856  BP: (!) 152/74 136/66 137/66 (!) 145/67  Pulse: 78 77 76 79  Resp: 15   16  Temp: 97.9 F (36.6 C) 98.1 F (36.7 C) 97.6 F (36.4 C) 97.9 F (36.6 C)  TempSrc: Oral Oral Oral Oral  SpO2: 98% 99% 98% 98%  Weight:  74.6 kg 74.6 kg   Height:        Inpatient medications: . atorvastatin  10 mg Oral Daily  . carvedilol  25 mg Oral BID WC  . enoxaparin (LOVENOX) injection  30 mg Subcutaneous Q24H  . escitalopram  20 mg Oral QHS  . furosemide  80 mg Oral Daily  . gentamicin cream  1 application Topical Daily  . hydrALAZINE  100 mg Oral BID  . insulin aspart  0-9 Units Subcutaneous TID WC  . insulin glargine  15 Units Subcutaneous Daily  . isosorbide mononitrate  15 mg Oral Daily  . polyethylene glycol  17 g Oral Daily  . sevelamer carbonate  1,600 mg Oral TID WC  . sodium chloride flush  3 mL Intravenous Once   . dialysis solution 1.5% low-MG/low-CA     acetaminophen **OR** acetaminophen, diphenhydrAMINE, dianeal solution for CAPD/CCPD with heparin, hydrALAZINE, morphine injection, ondansetron **OR** ondansetron (ZOFRAN) IV, oxyCODONE, traMADol    Exam: Gen alert, no distress Chest clear bilat to bases, no rales or wheezing RRR no MRG Abd soft ntnd no mass or ascites, RLQ PD cath intact, L abd stab wounds are dry, +BS, mildly tender diffusely Ext no LE or UE edema, no wounds or ulcers Neuro is alert, Ox 3 , nf    Home meds:  - aspirin 81/ atorvastatin 10 qd/ isosorbide mononitrate 15 qd  - hydralazine 50 bid/ furosemide 80 qd/ carvedilol 25 bid  - insulin glargine 24u hs/ insulin aspart   - gabapentin 100  hs/ escitalopram 20 hs  - sevelamer 2-3 ac tid  - prn's/ vitamins/ supplements    Outpt HD: PD 5 exchanges, 2.4 L fill volume, 1.5 hr dwells, no last fill or day exchange  Low - volume regimen here > 8 exchanges, 500 cc volume , 1 hr dwell time   Assessment/ Plan: 1. Incarc umbilical hernia - sp repair CCS 9/4.   2. ESRD -patient continuing on PD and perioperative setting, currently will use reduced volume supine PD for 1 week before resuming outpatient prescription. 3. VOlume - euvolemic on exam 4. HTN -stable  5. MBD ckd - cont binder 6. DM2 7. Anemia, recent decline not surprising, continue to trend.  ESA 9/7   [facility] 06/20/2019, 10:18 AM  Iron/TIBC/Ferritin/ %Sat    Component Value Date/Time   IRON 30 10/25/2018 0836   TIBC 252 10/25/2018 0836   FERRITIN 252 10/25/2018 0836   IRONPCTSAT 12 10/25/2018 0836   IRONPCTSAT 13 10/13/2016 0831   Recent Labs  Lab 06/16/19 1843  06/18/19 0408  06/20/19 0413  NA 138   < > 137   < > 135  K 3.2*   < > 3.9   < > 3.6  CL 100   < > 99   < > 99  CO2 23   < > 24   < > 25  GLUCOSE 123*   < > 258*   < > 66*  BUN 27*   < > 46*   < > 50*  CREATININE 7.36*   < > 7.42*   < > 8.20*  CALCIUM 8.5*   < > 8.1*   < > 7.9*  PHOS  --   --  6.5*  --   --   ALBUMIN 2.9*  --   --   --   --    < > = values in this interval not displayed.   Recent Labs  Lab 06/16/19 1843  AST 15  ALT 15  ALKPHOS 58  BILITOT 0.7  PROT 6.4*   Recent Labs  Lab 06/18/19 0408  WBC 14.5*  HGB 8.2*  HCT 25.3*  PLT 210

## 2019-06-20 NOTE — Discharge Summary (Signed)
Physician Discharge Summary  Patricia [name] MWU:[id number] DOB: [birth year] DOA: 06/16/2019  PCP: Ladell Pier, MD  Admit date: 06/16/2019 Discharge date: 06/20/2019  Admitted From: Home Disposition: Home  Recommendations for Outpatient Follow-up:  1. Follow up with PCP in 1-2 weeks 2. Follow-up with general surgery, Dr. Rosendo Gros as scheduled for postop check 3. New PD prescription relayed to patient's dialysis home team by nephrology, Dr. Joelyn Oms  Home Health: No Equipment/Devices: PD catheter  Discharge Condition: Stable CODE STATUS: Full code Diet recommendation: Heart Healthy / Carb Modified   History of present illness:  Patricia [name] a 63 y.o.femalewith medical history significant ofanemia, cardiomyopathy, CHF, ESRD on peritoneal dialysis, insulin-dependent type 2 diabetes, hypertension, obesity, cesarean section, hysterectomy presenting to the hospital for evaluation of abdominal pain, nausea, and vomiting. Patient was seen by gastroenterology today and had an outpatient CT scan performed which showed a strangulated hernia.  History provided by patient and daughter at bedside. Patient states she was seen in the emergency room 3 weeks ago for abdominal pain and told she had diverticulitis. She was treated with a course of antibiotics. She has continued to have severe abdominal pain which is located in the periumbilical region. Also having nonbloody nonbilious emesis. Having regular soft bowel movements. Denies diarrhea, blood in stool, or constipation. Denies fevers or chills. She has not noticed any change in the color of the fluid that is removed via peritoneal dialysis. She still makes urine. Denies dysuria, urinary frequency, or urgency. Daughter states that patient has had previous umbilical hernia repair surgery. Patient she did not take her blood pressure medications today. No other complaints.  ED Course:Blood pressure elevated, highest recorded reading  210/187. Afebrile and remainder of vital signs stable. Labs showing no leukocytosis. Hemoglobin 9.3, at baseline. Potassium 3.2. Lipase and LFTs normal. Patient received morphine and Benadryl in the ED. CT abdomen pelvis done earlier today with findings concerning for incarcerated or strangulated umbilical hernia. ED provider discussed the case with Dr. Rosendo Gros from general surgery who has seen the patient and requesting admission by hospitalist service with plans to take to the operating room this evening.  Hospital course:  Strangulated/incarcerated umbilical hernia Patient presenting with severe abdominal pain localized in the periumbilical region nauseated with nausea and vomiting.  CT abdomen/pelvis notable for findings concerning for incarcerated versus strangulated umbilical hernia.  Patient underwent diagnostic laparoscopy on 06/16/2019 by Dr. Rosendo Gros with primary closure of the umbilical hernia.  Patient is tolerating diet.  Continue pain control with oxycodone as needed.  Patient follow-up with general surgery, Dr. Rosendo Gros in 2 to 3 weeks for postoperative check.  Hypokalemia: Resolved Potassium 3.2 on admission, likely secondary to GI losses from vomiting and decreased oral intake.  Repleted during the hospitalization.  Hypertensive urgency Blood pressures remain sustained in the upper 180s.  Patient was continued on Coreg 25 mg p.o. twice daily, Imdur 15 mg p.o. daily, and hydralazine was increased to 100 mg p.o. twice daily.  Continue to monitor blood pressures outpatient and adjust accordingly.  ESRD on peritoneal dialysis Continue prescription for low volume PD per nephrology with recent abdominal hernia repair, which was communicated to patient's home dialysis team by nephrology.  Insulin-dependent type 2 diabetes mellitus On Lantus 24 units qHS and Novolog 3 units TIDAC at home.   Chronic combined systolic and diastolic congestive heart failure Last echo done February  2020 with left ventricular EF 40 to 72% and diastolic parameters consistent with impaired relaxation. Continue volume management with PD per  nephrology.  Continue home Coreg and furosemide.  Urinary retention Bladder scan owing surgical intervention with greater than 720 mL of retained urine.  Received in and out catheterization. No issues during the remainder of the hospitalization.  Discharge Diagnoses:  Active Problems:   CHF (congestive heart failure) (HCC)   ESRD (end stage renal disease) [facility]    Discharge Instructions  Discharge Instructions    (HEART FAILURE PATIENTS) Call MD:  Anytime you have any of the following symptoms: 1) 3 pound weight gain in 24 hours or 5 pounds in 1 week 2) shortness of breath, with or without a dry hacking cough 3) swelling in the hands, feet or stomach 4) if you have to sleep on extra pillows at night in order to breathe.   Complete by: As directed    Call MD for:  difficulty breathing, headache or visual disturbances   Complete by: As directed    Call MD for:  extreme fatigue   Complete by: As directed    Call MD for:  persistant dizziness or light-headedness   Complete by: As directed    Call MD for:  persistant nausea and vomiting   Complete by: As directed    Call MD for:  severe uncontrolled pain   Complete by: As directed    Call MD for:  temperature >100.4   Complete by: As directed    Diet - low sodium heart healthy   Complete by: As directed    Increase activity slowly   Complete by: As directed      Allergies as of 06/20/2019      Reactions   Codone [hydrocodone] Itching   Norvasc [amlodipine Besylate] Swelling   Lower extremity?   Sulfa Antibiotics Itching, Rash      Medication List    TAKE these medications   Accu-Chek FastClix Lancets Misc Uad tid   Accu-Chek Guide w/Device Kit 1 each by Does not apply route 3 (three) times daily. Use tid as directed   acetaminophen 325 MG tablet Commonly known as: Tylenol Take 2  tablets (650 mg total) by mouth every 6 (six) hours as needed. What changed: reasons to take this   albuterol 108 (90 Base) MCG/ACT inhaler Commonly known as: VENTOLIN HFA Inhale 1-2 puffs into the lungs every 6 (six) hours as needed for wheezing or shortness of breath.   aspirin EC 81 MG tablet Take 1 tablet (81 mg total) by mouth daily.   atorvastatin 20 MG tablet Commonly known as: LIPITOR Take 1/2 (one-half) tablet by mouth once daily What changed: See the new instructions.   calcium carbonate 500 MG chewable tablet Commonly known as: TUMS - dosed in mg elemental calcium Chew 1 tablet by mouth 3 (three) times daily as needed for indigestion or heartburn.   carvedilol 25 MG tablet Commonly known as: COREG TAKE 1 TABLET BY MOUTH TWICE DAILY WITH A MEAL What changed: See the new instructions.   dicyclomine 10 MG capsule Commonly known as: BENTYL Take 1 capsule (10 mg total) by mouth every 6 (six) hours as needed for spasms.   escitalopram 20 MG tablet Commonly known as: LEXAPRO TAKE 1 TABLET BY MOUTH AT BEDTIME.   fluticasone 50 MCG/ACT nasal spray Commonly known as: FLONASE Place 1 spray into both nostrils daily. What changed:   when to take this  reasons to take this   furosemide 80 MG tablet Commonly known as: LASIX Take 1 tablet (80 mg total) by mouth 2 (two) times daily. What changed:  when to take this   gabapentin 100 MG capsule Commonly known as: NEURONTIN TAKE 1 CAPSULE (100 MG TOTAL) BY MOUTH AT BEDTIME.   Garlic 889 MG Tabs Take 100 mg by mouth daily.   gentamicin cream 0.1 % Commonly known as: GARAMYCIN Apply 1 application topically See admin instructions. Apply topically to dialysis site after showering - every other day   glucose 4 GM chewable tablet Chew 1 tablet by mouth once as needed for low blood sugar.   glucose blood test strip Commonly known as: Accu-Chek Guide Use as instructed tid   hydrALAZINE 100 MG tablet Commonly known as:  APRESOLINE Take 1 tablet (100 mg total) by mouth 2 (two) times daily. What changed:   medication strength  how much to take   insulin aspart 100 UNIT/ML injection Commonly known as: NovoLOG 3 units before meals. What changed:   how much to take  how to take this  when to take this  reasons to take this  additional instructions   Insulin Glargine 100 UNIT/ML Solostar Pen Commonly known as: Lantus SoloStar Inject 4 Units into the skin at bedtime. What changed:   how much to take  when to take this  reasons to take this   Insulin Pen Needle 32G X 4 MM Misc Commonly known as: TRUEplus Pen Needles Use as directed to inject insulin.   Insulin Pen Needle 31G X 5 MM Misc Use as directed   isosorbide mononitrate 30 MG 24 hr tablet Commonly known as: IMDUR Take 1/2 (one-half) tablet by mouth once daily What changed: See the new instructions.   ondansetron 4 MG disintegrating tablet Commonly known as: Zofran ODT Take every 4-6 hours as needed for nausea What changed:   how much to take  how to take this  when to take this  reasons to take this  additional instructions   oxyCODONE 5 MG immediate release tablet Commonly known as: Oxy IR/ROXICODONE Take 1 tablet (5 mg total) by mouth every 6 (six) hours as needed for up to 3 days for severe pain.   prednisoLONE acetate 1 % ophthalmic suspension Commonly known as: PRED FORTE   sevelamer 800 MG tablet Commonly known as: RENAGEL Take 1,600-3,200 mg by mouth See admin instructions. Take 2-4 tablets (1600-3200 mg) by mouth up to three times daily with meals - 2-3 tablets with small meal, 4 tablets with large meal   Systane 0.4-0.3 % Soln Generic drug: Polyethyl Glycol-Propyl Glycol Place 1 drop into both eyes 3 (three) times daily as needed (dry eyes).      Follow-up Information    Ralene Ok, MD Follow up in 2 week(s).   Specialty: General Surgery Why: for follow up from hernia repair Contact  information: [place] [address] [phone]        Ladell Pier, MD. Schedule an appointment as soon as possible for a visit in 1 week(s).   Specialty: Internal Medicine Contact information: [place] [address] [phone]        Sanda Klein, MD .   Specialty: Cardiology Contact information: [address] [place] [address] [phone]          Allergies  Allergen Reactions  . Codone [Hydrocodone] Itching  . Norvasc [Amlodipine Besylate] Swelling    Lower extremity?  . Sulfa Antibiotics Itching and Rash    Consultations:  General surgery, Dr. Rosendo Gros  Nephrology   Procedures/Studies: Ct Abdomen Pelvis Wo Contrast  Result Date: 05/25/2019 CLINICAL DATA:  63 year old female with generalized abdominal pain x2 weeks. History of diabetes and chronic kidney disease. EXAM: CT ABDOMEN AND PELVIS WITHOUT CONTRAST TECHNIQUE: Multidetector CT imaging of the abdomen and pelvis was performed following the standard protocol without IV contrast. COMPARISON:  CT of the abdomen pelvis dated 11/01/2016 FINDINGS: Evaluation of this exam is limited in the absence of intravenous contrast. Lower chest: Minimal left lung base linear atelectasis or scarring. The visualized lung bases are otherwise clear. There is hypoattenuation of the cardiac blood pool suggestive of a degree of anemia. Clinical correlation is recommended. No intra-abdominal free air or free fluid. Hepatobiliary: No focal liver abnormality is seen. No gallstones, gallbladder wall thickening, or biliary dilatation. Pancreas: Unremarkable. No pancreatic ductal dilatation or surrounding inflammatory changes. Spleen: Normal in size without focal abnormality. Adrenals/Urinary Tract: The adrenal glands are unremarkable. There is no hydronephrosis or nephrolithiasis on either side. Minimal bilateral perinephric stranding, nonspecific. Correlation with  urinalysis recommended to exclude UTI. The visualized ureters appear unremarkable. The urinary bladder is partially distended and grossly unremarkable. Stomach/Bowel: There is sigmoid diverticulosis with inflammatory changes of the distal descending/sigmoid junction diverticula consistent with acute diverticulitis. No drainable fluid collection/abscess or perforation. There is a small hiatal hernia. There is no bowel obstruction. The appendix is normal. Vascular/Lymphatic: Moderate aortoiliac atherosclerotic disease. The IVC is grossly unremarkable. No portal venous gas. There is no adenopathy. Reproductive: Hysterectomy. No pelvic mass. Other: A peritoneal dialysis catheter with tip in the pelvis noted. There is a small amount of fluid in the subcutaneous soft tissues of the right anterior pelvic wall around the catheter. There is a small umbilical hernia with a small amount of fluid within the herniated fat. Correlation with clinical exam is recommended to exclude incarceration. No herniated bowel. Focal area of stranding in the upper omentum (series 3, image 29) likely represents an area of omental infarct. No fluid collection. Musculoskeletal: Mild degenerative changes of the spine. Grade 1 L4-L5 anterolisthesis. No acute osseous pathology. IMPRESSION: 1. Sigmoid diverticulitis. No abscess or perforation. 2. Small umbilical hernia with a small amount of fluid within the herniated fat. Correlation with clinical exam is recommended to exclude incarceration. No herniated bowel. 3. Aortic Atherosclerosis (ICD10-I70.0). Electronically Signed   By: Anner Crete M.D.   On: 05/25/2019 03:11   Ct Abdomen Wo Contrast  Result Date: 06/16/2019 CLINICAL DATA:  Severe epigastric abdominal pain radiating into umbilical area and left side, recently treated for diverticulitis, peritoneal dialysis EXAM: CT ABDOMEN WITHOUT CONTRAST TECHNIQUE: Multidetector CT imaging of the abdomen was performed following the standard  protocol without IV contrast. COMPARISON:  CT abdomen pelvis, 05/25/2019 FINDINGS: Lower chest: No acute abnormality. Hepatobiliary: No solid liver abnormality is seen. No gallstones, gallbladder wall thickening, or biliary dilatation. Pancreas: Unremarkable. No pancreatic ductal dilatation or surrounding inflammatory changes. Spleen: Normal in size without significant abnormality. Adrenals/Urinary Tract: Adrenal glands are unremarkable. Kidneys are normal, without renal calculi, solid lesion, or hydronephrosis. Bladder is unremarkable. Stomach/Bowel: Stomach is within normal limits. The sigmoid colon is incompletely imaged, the most proximal portion is included at the lower extent of the examination and appears thickened, with severe diverticulosis. Vascular/Lymphatic: Aortic atherosclerosis. No enlarged abdominal lymph nodes. Other: There is a small, fat, small bowel, and fluid containing umbilical hernia, which is incompletely imaged although contains a single, edematous appearing loop of mid small bowel without proximal evidence of obstruction. Measured portion of the hernia sac measures approximately 5.2 cm with a neck measuring 2.6 cm (series  2, image 53). There is a partially imaged peritoneal dialysis catheter in the soft tissues of the ventral abdomen, the intra-abdominal portion of the catheter is not imaged. Small volume ascites, in keeping with peritoneal dialysate. Musculoskeletal: No acute or significant osseous findings. IMPRESSION: 1. There is a small, fat, small bowel, and fluid containing umbilical hernia, which is incompletely imaged although contains a single, edematous appearing loop of mid small bowel without proximal evidence of obstruction. Measured portion of the hernia sac measures approximately 5.2 cm with a neck measuring 2.6 cm (series 2, image 53). This has increased in size when compared to prior examination, with new involvement of the bowel, and findings are concerning for  incarcerated or strangulated hernia. 2. The sigmoid colon is incompletely imaged, the most proximal portion is included at the lower extent of the examination and appears thickened, with severe diverticulosis. Findings are in keeping with diverticulitis identified on prior examination although complete imaging of the sigmoid colon would be helpful to assess for complications of diverticulitis if suspected based on persistent symptoms. 3. Small volume ascites, in keeping with peritoneal dialysate. There is a partially imaged peritoneal dialysis catheter in the soft tissues of the ventral abdomen, the intra-abdominal portion of the catheter is not imaged. These results will be called to the ordering clinician or representative by the Radiologist Assistant, and communication documented in the PACS or zVision Dashboard. Electronically Signed   By: Eddie Candle M.D.   On: 06/16/2019 16:38       Subjective: Patient seen and examined at bedside, resting comfortably.  No complaints this morning.  PD went well overnight.  Tolerating diet.  Ready for discharge home.  Denies headache, fever/chills/night sweats, no nausea cefonicid/diarrhea, no chest pain, no palpitations, no abdominal pain, no weakness, no fatigue, no paresthesias.  No acute events overnight per nursing staff.   Discharge Exam: Vitals:   06/20/19 0646 06/20/19 0856  BP: 137/66 (!) 145/67  Pulse: 76 79  Resp:  16  Temp: 97.6 F (36.4 C) 97.9 F (36.6 C)  SpO2: 98% 98%   Vitals:   06/20/19 0003 06/20/19 0616 06/20/19 0646 06/20/19 0856  BP: (!) 152/74 136/66 137/66 (!) 145/67  Pulse: 78 77 76 79  Resp: 15   16  Temp: 97.9 F (36.6 C) 98.1 F (36.7 C) 97.6 F (36.4 C) 97.9 F (36.6 C)  TempSrc: Oral Oral Oral Oral  SpO2: 98% 99% 98% 98%  Weight:  74.6 kg 74.6 kg   Height:        General exam: Appears calm and comfortable  Respiratory system: Clear to auscultation. Respiratory effort normal. Cardiovascular system: S1 & S2  heard, RRR. No JVD, murmurs, rubs, gallops or clicks. No pedal edema. Gastrointestinal system: Abdomen is nondistended, soft and nontender. No organomegaly or masses felt. Normal bowel sounds heard.  PD catheter noted in place, surgical incision sites noted, clean/dry/intact Central nervous system: Alert and oriented. No focal neurological deficits. Extremities: Symmetric 5 x 5 power. Skin: No rashes, lesions or ulcers Psychiatry: Judgement and insight appear normal. Mood & affect appropriate.     The results of significant diagnostics from this hospitalization (including imaging, microbiology, ancillary and laboratory) are listed below for reference.     Microbiology: Recent Results (from the past 240 hour(s))  SARS Coronavirus 2 [facility] order, Performed in [facility] hospital lab) Nasopharyngeal Nasopharyngeal Swab     Status: None   Collection Time: 06/16/19  7:50 PM   Specimen: Nasopharyngeal Swab  Result Value Ref  Range Status   SARS Coronavirus 2 NEGATIVE NEGATIVE Final    Comment: (NOTE) If result is NEGATIVE SARS-CoV-2 target nucleic acids are NOT DETECTED. The SARS-CoV-2 RNA is generally detectable in upper and lower  respiratory specimens during the acute phase of infection. The lowest  concentration of SARS-CoV-2 viral copies this assay can detect is 250  copies / mL. A negative result does not preclude SARS-CoV-2 infection  and should not be used as the sole basis for treatment or other  patient management decisions.  A negative result may occur with  improper specimen collection / handling, submission of specimen other  than nasopharyngeal swab, presence of viral mutation(s) within the  areas targeted by this assay, and inadequate number of viral copies  (<250 copies / mL). A negative result must be combined with clinical  observations, patient history, and epidemiological information. If result is POSITIVE SARS-CoV-2 target nucleic acids are DETECTED. The  SARS-CoV-2 RNA is generally detectable in upper and lower  respiratory specimens dur ing the acute phase of infection.  Positive  results are indicative of active infection with SARS-CoV-2.  Clinical  correlation with patient history and other diagnostic information is  necessary to determine patient infection status.  Positive results do  not rule out bacterial infection or co-infection with other viruses. If result is PRESUMPTIVE POSTIVE SARS-CoV-2 nucleic acids MAY BE PRESENT.   A presumptive positive result was obtained on the submitted specimen  and confirmed on repeat testing.  While 2019 novel coronavirus  (SARS-CoV-2) nucleic acids may be present in the submitted sample  additional confirmatory testing may be necessary for epidemiological  and / or clinical management purposes  to differentiate between  SARS-CoV-2 and other Sarbecovirus currently known to infect humans.  If clinically indicated additional testing with an alternate test  methodology [id number]) is advised. The SARS-CoV-2 RNA is generally  detectable in upper and lower respiratory sp ecimens during the acute  phase of infection. The expected result is Negative. Fact Sheet for Patients:  StrictlyIdeas.no Fact Sheet for Healthcare Providers: BankingDealers.co.za This test is not yet approved or cleared by the [place] FDA and has been authorized for detection and/or diagnosis of SARS-CoV-2 by FDA under an Emergency Use Authorization (EUA).  This EUA will remain in effect (meaning this test can be used) for the duration of the COVID-19 declaration under Section 564(b)(1) of the Act, 21 U.S.C. section 360bbb-3(b)(1), unless the authorization is terminated or revoked sooner. Performed at [facility], [place] [address] [place], [place] [address]      Labs: BNP (last 3 results) Recent Labs    12/05/18 0155  BNP 0,626.9*   Basic Metabolic Panel: Recent  Labs  Lab 06/16/19 1843 06/16/19 2251 06/17/19 0344 06/18/19 0408 06/19/19 0040 06/20/19 0413  NA 138  --  138 137 135 135  K 3.2*  --  4.0 3.9 3.4* 3.6  CL 100  --  101 99 98 99  CO2 23  --  _0 GLUCOSE 123*  --  187* 258* 206* 66*  BUN 27*  --  30* 46* 51* 50*  CREATININE 7.36*  --  7.28* 7.42* 7.75* 8.20*  CALCIUM 8.5*  --  8.4* 8.1* 7.8* 7.9*  MG  --  2.0  --  1.9  --  1.8  PHOS  --   --   --  6.5*  --   --    Liver Function Tests: Recent Labs  Lab 06/16/19 1843  AST 15  ALT 15  ALKPHOS 58  BILITOT 0.7  PROT 6.4*  ALBUMIN 2.9*   Recent Labs  Lab 06/16/19 1843  LIPASE 32   No results for input(s): AMMONIA in the last 168 hours. CBC: Recent Labs  Lab 06/16/19 1843 06/17/19 0344 06/18/19 0408  WBC 8.3 8.2 14.5*  HGB 9.3* 9.0* 8.2*  HCT 28.4* 28.1* 25.3*  MCV 95.9 97.6 95.5  PLT 231 193 210   Cardiac Enzymes: No results for input(s): CKTOTAL, CKMB, CKMBINDEX, TROPONINI in the last 168 hours. BNP: Invalid input(s): POCBNP CBG: Recent Labs  Lab 06/19/19 1105 06/19/19 1657 06/19/19 2142 06/20/19 0623 06/20/19 0701  GLUCAP 189* 104* 154* 61* 95   D-Dimer No results for input(s): DDIMER in the last 72 hours. Hgb A1c No results for input(s): HGBA1C in the last 72 hours. Lipid Profile No results for input(s): CHOL, HDL, LDLCALC, TRIG, CHOLHDL, LDLDIRECT in the last 72 hours. Thyroid function studies No results for input(s): TSH, T4TOTAL, T3FREE, THYROIDAB in the last 72 hours.  Invalid input(s): FREET3 Anemia work up No results for input(s): VITAMINB12, FOLATE, FERRITIN, TIBC, IRON, RETICCTPCT in the last 72 hours. Urinalysis    Component Value Date/Time   COLORURINE YELLOW 06/02/2018 1713   APPEARANCEUR HAZY (A) 06/02/2018 1713   LABSPEC 1.012 06/02/2018 1713   PHURINE 5.0 06/02/2018 1713   GLUCOSEU 50 (A) 06/02/2018 1713   HGBUR NEGATIVE 06/02/2018 1713   BILIRUBINUR NEGATIVE 06/02/2018 1713   BILIRUBINUR 1.0 10/09/2016 1306    KETONESUR NEGATIVE 06/02/2018 1713   PROTEINUR >=300 (A) 06/02/2018 1713   UROBILINOGEN 0.2 04/24/2018 1529   NITRITE NEGATIVE 06/02/2018 1713   LEUKOCYTESUR NEGATIVE 06/02/2018 1713   Sepsis Labs Invalid input(s): PROCALCITONIN,  WBC,  LACTICIDVEN Microbiology Recent Results (from the past 240 hour(s))  SARS Coronavirus 2 [facility] order, Performed in [facility] hospital lab) Nasopharyngeal Nasopharyngeal Swab     Status: None   Collection Time: 06/16/19  7:50 PM   Specimen: Nasopharyngeal Swab  Result Value Ref Range Status   SARS Coronavirus 2 NEGATIVE NEGATIVE Final    Comment: (NOTE) If result is NEGATIVE SARS-CoV-2 target nucleic acids are NOT DETECTED. The SARS-CoV-2 RNA is generally detectable in upper and lower  respiratory specimens during the acute phase of infection. The lowest  concentration of SARS-CoV-2 viral copies this assay can detect is 250  copies / mL. A negative result does not preclude SARS-CoV-2 infection  and should not be used as the sole basis for treatment or other  patient management decisions.  A negative result may occur with  improper specimen collection / handling, submission of specimen other  than nasopharyngeal swab, presence of viral mutation(s) within the  areas targeted by this assay, and inadequate number of viral copies  (<250 copies / mL). A negative result must be combined with clinical  observations, patient history, and epidemiological information. If result is POSITIVE SARS-CoV-2 target nucleic acids are DETECTED. The SARS-CoV-2 RNA is generally detectable in upper and lower  respiratory specimens dur ing the acute phase of infection.  Positive  results are indicative of active infection with SARS-CoV-2.  Clinical  correlation with patient history and other diagnostic information is  necessary to determine patient infection status.  Positive results do  not rule out bacterial infection or co-infection with other viruses. If result  is PRESUMPTIVE POSTIVE SARS-CoV-2 nucleic acids MAY BE PRESENT.   A presumptive positive result was obtained on the submitted specimen  and confirmed on repeat testing.  While  2019 novel coronavirus  (SARS-CoV-2) nucleic acids may be present in the submitted sample  additional confirmatory testing may be necessary for epidemiological  and / or clinical management purposes  to differentiate between  SARS-CoV-2 and other Sarbecovirus currently known to infect humans.  If clinically indicated additional testing with an alternate test  methodology [phone]) is advised. The SARS-CoV-2 RNA is generally  detectable in upper and lower respiratory sp ecimens during the acute  phase of infection. The expected result is Negative. Fact Sheet for Patients:  StrictlyIdeas.no Fact Sheet for Healthcare Providers: BankingDealers.co.za This test is not yet approved or cleared by the [place] FDA and has been authorized for detection and/or diagnosis of SARS-CoV-2 by FDA under an Emergency Use Authorization (EUA).  This EUA will remain in effect (meaning this test can be used) for the duration of the COVID-19 declaration under Section 564(b)(1) of the Act, 21 U.S.C. section 360bbb-3(b)(1), unless the authorization is terminated or revoked sooner. Performed at [facility], [place] [address], [place], [place] [address]      Time coordinating discharge: Over 30 minutes  SIGNED:    J [place] DO  [facility] 06/20/2019, 10:40 AM

## 2019-06-20 NOTE — Progress Notes (Addendum)
Hypoglycemic Event  CBG:  61  Treatment: 8 oz orange juice; graham crackers and 1 oz peanut butter  Symptoms: dizziness upon standing  Follow-up CBG: Time:0700 CBG Result:95  Possible Reasons for Event: Pt states her blood sugar "hasn't been right since coming into hospital"  Comments/MD notified:    Lajoyce Corners

## 2019-06-27 NOTE — Progress Notes (Signed)
____________________________________________________________  Attending physician addendum:  Thank you for sending this case to me. I have reviewed the entire note, and the outlined plan seems appropriate.  I see that she had urgent findings on the CT scan, and was admitted on September 4 to undergo repair of an umbilical hernia that had a loop of incarcerated transverse colon within it.  Wilfrid Lund, MD  ____________________________________________________________

## 2019-07-06 ENCOUNTER — Ambulatory Visit: Payer: Medicare Other | Attending: Internal Medicine | Admitting: Internal Medicine

## 2019-07-06 ENCOUNTER — Encounter: Payer: Self-pay | Admitting: Internal Medicine

## 2019-07-06 ENCOUNTER — Other Ambulatory Visit: Payer: Self-pay

## 2019-07-06 ENCOUNTER — Ambulatory Visit (HOSPITAL_BASED_OUTPATIENT_CLINIC_OR_DEPARTMENT_OTHER): Payer: Medicare Other | Admitting: Pharmacist

## 2019-07-06 ENCOUNTER — Telehealth: Payer: Self-pay | Admitting: Internal Medicine

## 2019-07-06 VITALS — BP 133/65 | HR 79 | Temp 98.8°F | Resp 18 | Ht 63.0 in | Wt 164.0 lb

## 2019-07-06 DIAGNOSIS — I120 Hypertensive chronic kidney disease with stage 5 chronic kidney disease or end stage renal disease: Secondary | ICD-10-CM | POA: Diagnosis not present

## 2019-07-06 DIAGNOSIS — N186 End stage renal disease: Secondary | ICD-10-CM | POA: Diagnosis not present

## 2019-07-06 DIAGNOSIS — N184 Chronic kidney disease, stage 4 (severe): Secondary | ICD-10-CM | POA: Insufficient documentation

## 2019-07-06 DIAGNOSIS — F329 Major depressive disorder, single episode, unspecified: Secondary | ICD-10-CM | POA: Diagnosis not present

## 2019-07-06 DIAGNOSIS — I132 Hypertensive heart and chronic kidney disease with heart failure and with stage 5 chronic kidney disease, or end stage renal disease: Secondary | ICD-10-CM | POA: Insufficient documentation

## 2019-07-06 DIAGNOSIS — E1121 Type 2 diabetes mellitus with diabetic nephropathy: Secondary | ICD-10-CM | POA: Diagnosis not present

## 2019-07-06 DIAGNOSIS — Z992 Dependence on renal dialysis: Secondary | ICD-10-CM | POA: Diagnosis not present

## 2019-07-06 DIAGNOSIS — Z7682 Awaiting organ transplant status: Secondary | ICD-10-CM | POA: Diagnosis not present

## 2019-07-06 DIAGNOSIS — I10 Essential (primary) hypertension: Secondary | ICD-10-CM | POA: Diagnosis not present

## 2019-07-06 DIAGNOSIS — Z79899 Other long term (current) drug therapy: Secondary | ICD-10-CM | POA: Insufficient documentation

## 2019-07-06 DIAGNOSIS — Z7982 Long term (current) use of aspirin: Secondary | ICD-10-CM | POA: Diagnosis not present

## 2019-07-06 DIAGNOSIS — Z23 Encounter for immunization: Secondary | ICD-10-CM | POA: Insufficient documentation

## 2019-07-06 DIAGNOSIS — E11319 Type 2 diabetes mellitus with unspecified diabetic retinopathy without macular edema: Secondary | ICD-10-CM | POA: Diagnosis not present

## 2019-07-06 DIAGNOSIS — I5043 Acute on chronic combined systolic (congestive) and diastolic (congestive) heart failure: Secondary | ICD-10-CM | POA: Insufficient documentation

## 2019-07-06 DIAGNOSIS — E1122 Type 2 diabetes mellitus with diabetic chronic kidney disease: Secondary | ICD-10-CM | POA: Insufficient documentation

## 2019-07-06 DIAGNOSIS — F419 Anxiety disorder, unspecified: Secondary | ICD-10-CM | POA: Diagnosis not present

## 2019-07-06 DIAGNOSIS — I5042 Chronic combined systolic (congestive) and diastolic (congestive) heart failure: Secondary | ICD-10-CM | POA: Insufficient documentation

## 2019-07-06 DIAGNOSIS — E785 Hyperlipidemia, unspecified: Secondary | ICD-10-CM | POA: Insufficient documentation

## 2019-07-06 DIAGNOSIS — E11649 Type 2 diabetes mellitus with hypoglycemia without coma: Secondary | ICD-10-CM | POA: Diagnosis not present

## 2019-07-06 DIAGNOSIS — Z794 Long term (current) use of insulin: Secondary | ICD-10-CM | POA: Insufficient documentation

## 2019-07-06 DIAGNOSIS — I429 Cardiomyopathy, unspecified: Secondary | ICD-10-CM | POA: Insufficient documentation

## 2019-07-06 DIAGNOSIS — D631 Anemia in chronic kidney disease: Secondary | ICD-10-CM | POA: Insufficient documentation

## 2019-07-06 LAB — GLUCOSE, POCT (MANUAL RESULT ENTRY): POC Glucose: 225 mg/dl — AB (ref 70–99)

## 2019-07-06 NOTE — Telephone Encounter (Signed)
Patient called back to inform her glipizide is 10mg  please follow up.

## 2019-07-06 NOTE — Telephone Encounter (Signed)
FYI from patient

## 2019-07-06 NOTE — Patient Instructions (Signed)
I have referred you for some therapy counseling.  Please try to check your blood sugars at least once a day and record the readings.  Bring those readings with you on your next visit.  Please bring all medications with you on next visit.

## 2019-07-06 NOTE — Progress Notes (Signed)
Patient ID: Patricia [name], female    DOB: 20-Apr-1956  MRN: [id number]  CC: Follow-up (DM)   Subjective: Patricia [name] is a 63 y.o. female who presents for chronic ds management Her concerns today include: Patient with history ofHTN, HL, DM type2 with retinopathy,neuropathyand nephropathy, ESRD on PD,sys and diastolic-CHF with EF 28-41% as of 11/2018,possible Takotsubo variant, mixed anemia ACDand IDA, HL and depression  Pt does no have meds with her today.  Hosp 9/4-05/2019 with incarcerated umbilical hernia.  This was repaired by Dr. Rosendo Gros.  She saw the surgeon in follow-up yesterday.  She reports that she is doing much better.  No further abdominal pain.  No nausea or vomiting.  ESRD on PD/HTN:  Has a f/u with Dr. Marylou Flesher 07/24/2019 Plans to put her transplant list.  Checks BP several times a wk before and after PD.  She does not have log book with her and does not recall the numbers.  Reports compliance with blood pressure medications that include carvedilol, hydralazine and isosorbide LE edema last wk Limits salt in foods  DM:  If BS less than 100, she does not take Lantus because BS drop too low during the night.  Not taking Novolog either.  Reports she still thinks Glucotrol 10 mg once a day Check BS 2-3 days.  Last BS yesterday was 152 Doing okay with eating habits.    Depression:  Gets depressed and overwhelmed at times due to chronic illness.  Being on dialysis and being more dependent on her children gets depressing.  She does not want to be a burden to her children.  She finds Lexapro helpful.  She feels she would benefit from some counseling provided she is able to do it virtually. Patient Active Problem List   Diagnosis Date Noted  . ESRD on peritoneal dialysis ([place]) 07/06/2019  . ESRD (end stage renal disease) ([place]) 01/20/2019  . CHF (congestive heart failure) ([place]) 12/06/2018  . Fluid overload 12/05/2018  . Type II diabetes mellitus with renal manifestations ([name])  12/05/2018  . Hypoglycemia 12/05/2018  . Acute on chronic combined systolic and diastolic CHF (congestive heart failure) ([place]) 06/14/2018  . CKD (chronic kidney disease) stage 5, GFR less than 15 ml/min (HCC) 06/14/2018  . Anemia due to stage 5 chronic kidney disease, not on chronic dialysis ([place]) 06/14/2018  . Systolic CHF ([place]) 32/44/0102  . Diabetes mellitus type 2 with complications ([place]) 72/53/6644  . Transaminasemia 11/29/2016  . Cardiomyopathy ([place]) 11/12/2016  . Lipoma 10/21/2016  . CKD (chronic kidney disease) stage 4, GFR 15-29 ml/min (HCC) 08/10/2016  . Anxiety and depression 12/12/2015  . Diabetic nephropathy associated with type 2 diabetes mellitus ([place]) 11/15/2014  . Moderate nonproliferative diabetic retinopathy(362.05) 04/17/2014  . 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 Essential hypertension 03/28/2014  . Dental caries 03/28/2014  . Dyslipidemia 04/18/2013     Current Outpatient Medications on File Prior to Visit  Medication Sig Dispense Refill  . ACCU-CHEK FASTCLIX LANCETS MISC [place] tid 100 each 12  . acetaminophen (TYLENOL) 325 MG tablet Take 2 tablets (650 mg total) by mouth every 6 (six) hours as needed. (Patient taking differently: Take 650 mg by mouth every 6 (six) hours as needed for headache (pain). ) 30 tablet 0  . albuterol (PROVENTIL HFA;VENTOLIN HFA) 108 (90 Base) MCG/ACT inhaler Inhale 1-2 puffs into the lungs every 6 (six) hours as needed for wheezing or shortness of breath. 1 Inhaler 0  . aspirin EC 81 MG tablet Take 1 tablet (81  mg total) by mouth daily. 90 tablet 3  . atorvastatin (LIPITOR) 20 MG tablet Take 1/2 (one-half) tablet by mouth once daily (Patient taking differently: Take 10 mg by mouth daily. ) 45 tablet 0  . Blood Glucose Monitoring Suppl (ACCU-CHEK GUIDE) w/Device KIT 1 each by Does not apply route 3 (three) times daily. Use tid as directed 1 kit 0  . calcium carbonate (TUMS - DOSED IN MG ELEMENTAL CALCIUM) 500 MG chewable tablet Chew  1 tablet by mouth 3 (three) times daily as needed for indigestion or heartburn.     . carvedilol (COREG) 25 MG tablet TAKE 1 TABLET BY MOUTH TWICE DAILY WITH A MEAL (Patient taking differently: Take 25 mg by mouth 2 (two) times daily with a meal. ) 60 tablet 10  . dicyclomine (BENTYL) 10 MG capsule Take 1 capsule (10 mg total) by mouth every 6 (six) hours as needed for spasms. 30 capsule 2  . escitalopram (LEXAPRO) 20 MG tablet TAKE 1 TABLET BY MOUTH AT BEDTIME. (Patient taking differently: Take 20 mg by mouth at bedtime. ) 30 tablet 2  . fluticasone (FLONASE) 50 MCG/ACT nasal spray Place 1 spray into both nostrils daily. (Patient taking differently: Place 1 spray into both nostrils daily as needed for allergies. ) 16 g 0  . furosemide (LASIX) 80 MG tablet Take 1 tablet (80 mg total) by mouth 2 (two) times daily. (Patient taking differently: Take 80 mg by mouth daily. ) 60 tablet 0  . gabapentin (NEURONTIN) 100 MG capsule TAKE 1 CAPSULE (100 MG TOTAL) BY MOUTH AT BEDTIME. 90 capsule 3  . Garlic 048 MG TABS Take 100 mg by mouth daily.     Marland Kitchen gentamicin cream (GARAMYCIN) 0.1 % Apply 1 application topically See admin instructions. Apply topically to dialysis site after showering - every other day    . glipiZIDE (GLUCOTROL XL) 10 MG 24 hr tablet Take by mouth.    Marland Kitchen glucose 4 GM chewable tablet Chew 1 tablet by mouth once as needed for low blood sugar.     Marland Kitchen glucose blood (ACCU-CHEK GUIDE) test strip Use as instructed tid 100 each 12  . hydrALAZINE (APRESOLINE) 100 MG tablet Take 1 tablet (100 mg total) by mouth 2 (two) times daily. 180 tablet 0  . insulin aspart (NOVOLOG) 100 UNIT/ML injection 3 units before meals. (Patient taking differently: Inject 2-3 Units into the skin 3 (three) times daily as needed for high blood sugar (CBG >200). ) 60 mL 3  . Insulin Glargine (LANTUS SOLOSTAR) 100 UNIT/ML Solostar Pen Inject 4 Units into the skin at bedtime. (Patient taking differently: Inject 24 Units into the skin  at bedtime as needed (sugar levels). ) 15 mL 2  . Insulin Pen Needle (TRUEPLUS PEN NEEDLES) 32G X 4 MM MISC Use as directed to inject insulin. 100 each 3  . Insulin Pen Needle 31G X 5 MM MISC Use as directed 100 each 12  . isosorbide mononitrate (IMDUR) 30 MG 24 hr tablet Take 1/2 (one-half) tablet by mouth once daily (Patient taking differently: Take 15 mg by mouth daily. ) 15 tablet 6  . ondansetron (ZOFRAN ODT) 4 MG disintegrating tablet Take every 4-6 hours as needed for nausea (Patient taking differently: Take 4 mg by mouth every 4 (four) hours as needed for nausea or vomiting. ) 30 tablet 2  . Polyethyl Glycol-Propyl Glycol (SYSTANE) 0.4-0.3 % SOLN Place 1 drop into both eyes 3 (three) times daily as needed (dry eyes).     Marland Kitchen  sevelamer (RENAGEL) 800 MG tablet Take 1,600-3,200 mg by mouth See admin instructions. Take 2-4 tablets (1600-3200 mg) by mouth up to three times daily with meals - 2-3 tablets with small meal, 4 tablets with large meal     No current facility-administered medications on file prior to visit.     Allergies  Allergen Reactions  . Codone [Hydrocodone] Itching  . Norvasc [Amlodipine Besylate] Swelling    Lower extremity?  . Sulfa Antibiotics Itching and Rash    Social History   Socioeconomic History  . Marital status: Divorced    Spouse name: Not on file  . Number of children: 2  . Years of education: Not on file  . Highest education level: Not on file  Occupational History  . Occupation: disabled  Social Needs  . Financial resource strain: Not on file  . Food insecurity    Worry: Not on file    Inability: Not on file  . Transportation needs    Medical: Not on file    Non-medical: Not on file  Tobacco Use  . Smoking status: Never Smoker  . Smokeless tobacco: Never Used  Substance and Sexual Activity  . Alcohol use: No  . Drug use: No  . Sexual activity: Not Currently  Lifestyle  . Physical activity    Days per week: Not on file    Minutes per  session: Not on file  . Stress: Not on file  Relationships  . Social Herbalist on phone: Not on file    Gets together: Not on file    Attends religious service: Not on file    Active member of club or organization: Not on file    Attends meetings of clubs or organizations: Not on file    Relationship status: Not on file  . Intimate partner violence    Fear of current or ex partner: Not on file    Emotionally abused: Not on file    Physically abused: Not on file    Forced sexual activity: Not on file  Other Topics Concern  . Not on file  Social History Narrative  . Not on file    Family History  Problem Relation Age of Onset  . Diabetes Brother   . Stomach cancer Brother   . Kidney disease Brother   . Heart Problems Maternal Grandmother        pacemaker  . Heart disease Maternal Grandfather   . Rectal cancer Neg Hx   . Esophageal cancer Neg Hx   . Liver cancer Neg Hx   . Colon cancer Neg Hx     Past Surgical History:  Procedure Laterality Date  . ABDOMINAL HYSTERECTOMY    . BREAST SURGERY     reduction  . [place]  . LAPAROSCOPY N/A 06/16/2019   Procedure: primary closure of umbilical hernia;  Surgeon: Ralene Ok, MD;  Location: [facility];  Service: General;  Laterality: N/A;  . LIPOMA EXCISION     back  Dr. Excell Seltzer 03-22-18  . LIPOMA EXCISION N/A 03/22/2018   Procedure: EXCISION OF BACK LIPOMA;  Surgeon: Excell Seltzer, MD;  Location: [facility];  Service: General;  Laterality: N/A;  . REDUCTION MAMMAPLASTY Bilateral     ROS: Review of Systems Negative except as stated above  PHYSICAL EXAM: BP 133/65 (BP Location: Right Arm, Patient Position: Sitting, Cuff Size: Normal)   Pulse 79   Temp 98.8 F (37.1 C) (Oral)   Resp 18  Ht _0  (1.6 m)   Wt 164 lb (74.4 kg)   SpO2 97%   BMI 29.05 kg/m   Wt Readings from Last 3 Encounters:  07/06/19 164 lb (74.4 kg)  06/20/19 164 lb 7.4 oz (74.6 kg)  06/16/19 158 lb (71.7 kg)      Physical Exam  General appearance - alert, well appearing, and in no distress Mental status - normal mood, behavior, speech, dress, motor activity, and thought processes Eyes - pupils equal and reactive, extraocular eye movements intact Mouth - mucous membranes moist, pharynx normal without lesions Neck - supple, no significant adenopathy Chest - clear to auscultation, no wheezes, rales or rhonchi, symmetric air entry Heart - normal rate, regular rhythm, normal S1, S2, no murmurs, rubs, clicks or gallops Abdomen -PD catheter noted in the right midabdomen.  Scars from recent laparoscopic umbilical hernia repair are well-healed. Extremities -no lower extremity edema Diabetic Foot Exam - Simple   Simple Foot Form Visual Inspection No deformities, no ulcerations, no other skin breakdown bilaterally: Yes Sensation Testing Intact to touch and monofilament testing bilaterally: Yes Pulse Check Posterior Tibialis and Dorsalis pulse intact bilaterally: Yes Comments      BS 225  Depression screen [facility] 2/9 07/06/2019 02/06/2019 10/18/2018  Decreased Interest 2 0 1  Down, Depressed, Hopeless 1 0 0  PHQ - 2 Score 3 0 1  Altered sleeping 0 0 -  Tired, decreased energy 1 0 -  Change in appetite 0 0 -  Feeling bad or failure about yourself  0 0 -  Trouble concentrating 1 0 -  Moving slowly or fidgety/restless 0 0 -  Suicidal thoughts 0 - -  PHQ-9 Score 5 0 -  Difficult doing work/chores - Not difficult at all -  Some recent data might be hidden    Lab Results  Component Value Date   HGBA1C 7.0 (H) 06/16/2019     CMP Latest Ref Rng & Units 06/20/2019 06/19/2019 06/18/2019  Glucose 70 - 99 mg/dL 66(L) 206(H) 258(H)  BUN 6 - 20 mg/dL 50(H) 51(H) 46(H)  Creatinine 0.44 - 1.00 mg/dL 8.20(H) 7.75(H) 7.42(H)  Sodium 135 - 145 mmol/L 135 135 137  Potassium 3.5 - 5.1 mmol/L 3.6 3.4(L) 3.9  Chloride 98 - 111 mmol/L 99 98 99  CO2 22 - 32 mmol/L _1 Calcium 8.9 - 10.3 mg/dL 7.9(L) 7.8(L) 8.1(L)   Total Protein 6.5 - 8.1 g/dL - - -  Total Bilirubin 0.3 - 1.2 mg/dL - - -  Alkaline Phos 38 - 126 U/L - - -  AST 15 - 41 U/L - - -  ALT 0 - 44 U/L - - -   Lipid Panel     Component Value Date/Time   CHOL 144 12/01/2016 0616   TRIG 129 12/01/2016 0616   HDL 36 (L) 12/01/2016 0616   CHOLHDL 4.0 12/01/2016 0616   VLDL 26 12/01/2016 0616   LDLCALC 82 12/01/2016 0616    CBC    Component Value Date/Time   WBC 14.5 (H) 06/18/2019 0408   RBC 2.65 (L) 06/18/2019 0408   HGB 8.2 (L) 06/18/2019 0408   HGB 9.6 (L) 06/06/2019 1118   HCT 25.3 (L) 06/18/2019 0408   HCT 29.0 (L) 06/06/2019 1118   PLT 210 06/18/2019 0408   PLT 235 06/06/2019 1118   MCV 95.5 06/18/2019 0408   MCV 94 06/06/2019 1118   MCH 30.9 06/18/2019 0408   MCHC 32.4 06/18/2019 0408   RDW 14.6 06/18/2019 0408  RDW 15.1 06/06/2019 1118   LYMPHSABS 2.4 01/04/2017 1220   MONOABS 0.3 11/29/2016 0325   EOSABS 0.1 01/04/2017 1220   BASOSABS 0.0 01/04/2017 1220    ASSESSMENT AND PLAN: 1.  Diabetes type 2 causing end-stage renal disease A1c at goal for someone with her degree of chronic comorbidities. Encouraged her to check blood sugars daily.  Ideally should be off Glucotrol but patient has been stable on this and reports that she had continue to take it and is not taking the insulin. - Glucose (CBG)  2. Essential hypertension Close to goal.  Continue current medications and low-salt diet  3. Chronic combined systolic and diastolic heart failure (HCC) Clinically stable and compensated continue current medications  4. ESRD on peritoneal dialysis [facility]) Patient tells me that her nephrologist plans to put her on the transplant list  5. Reactive depression - Ambulatory referral to Psychiatry  6. Need for influenza vaccination Given    Patient was given the opportunity to ask questions.  Patient verbalized understanding of the plan and was able to repeat key elements of the plan.   Orders Placed This Encounter   Procedures  . Ambulatory referral to Psychiatry  . Glucose (CBG)     Requested Prescriptions    No prescriptions requested or ordered in this encounter    Return in about 3 months (around 10/05/2019).  Karle Plumber, MD, FACP

## 2019-07-06 NOTE — Progress Notes (Signed)
Patient presents for vaccination against influenza per orders of Dr. Johnson. Consent given. Counseling provided. No contraindications exists. Vaccine administered without incident.   

## 2019-07-21 MED FILL — GABAPENTIN 100 MG CAP: 100 | 30 days supply | Qty: 30 | Fill #3

## 2019-07-21 MED FILL — ESCITALOPRAM 20 MG TABLET: 20 | 30 days supply | Qty: 30 | Fill #1

## 2019-08-02 ENCOUNTER — Other Ambulatory Visit: Payer: Self-pay

## 2019-08-02 ENCOUNTER — Ambulatory Visit (INDEPENDENT_AMBULATORY_CARE_PROVIDER_SITE_OTHER): Payer: Medicare Other | Admitting: Licensed Clinical Social Worker

## 2019-08-02 ENCOUNTER — Encounter (HOSPITAL_COMMUNITY): Payer: Self-pay | Admitting: Licensed Clinical Social Worker

## 2019-08-02 DIAGNOSIS — F4323 Adjustment disorder with mixed anxiety and depressed mood: Secondary | ICD-10-CM
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
## 2019-08-24 ENCOUNTER — Other Ambulatory Visit: Payer: Self-pay | Admitting: Internal Medicine

## 2019-08-24 ENCOUNTER — Telehealth: Payer: Self-pay | Admitting: Cardiovascular Disease

## 2019-08-24 DIAGNOSIS — Z1231 Encounter for screening mammogram for malignant neoplasm of breast: Secondary | ICD-10-CM

## 2019-08-24 NOTE — Telephone Encounter (Signed)
Patient is calling because she is going to have a stress test at [facility] and she states that [facility] wanted to find out if she needs to withhold her carvedilol (COREG) 25 MG tablet.

## 2019-08-24 NOTE — Telephone Encounter (Signed)
Called patient, she states that they wanted to make sure with Dr.C that she would be okay to hold the Carvedilol the day of her stress test.   I advised normally they do hold it- but would check with MD to make sure safe for her.  Patient verbalized understanding,

## 2019-08-24 NOTE — Telephone Encounter (Signed)
Yes, she needs to hold the carvedilol for the stress test to get a valid test. Can restart immediately after the test

## 2019-08-25 NOTE — Telephone Encounter (Signed)
The patient has been made aware and verbalized her understanding.  

## 2019-09-11 MED FILL — ESCITALOPRAM 20 MG TABLET: 20 | 30 days supply | Qty: 30 | Fill #2

## 2019-09-11 MED FILL — GABAPENTIN 100 MG CAP: 100 | 30 days supply | Qty: 30 | Fill #4

## 2019-09-21 MED FILL — GABAPENTIN 100 MG CAP: 100 | 30 days supply | Qty: 30 | Fill #4

## 2019-09-21 MED FILL — ESCITALOPRAM 20 MG TABLET: 20 | 30 days supply | Qty: 30 | Fill #2

## 2019-10-09 ENCOUNTER — Telehealth: Payer: Self-pay | Admitting: Physician Assistant

## 2019-10-09 NOTE — Telephone Encounter (Signed)
Called patient back and she states for 1 week she has been having pain/cramping in her LLQ, similar to what she has had in the past. She is nauseated and had 1 emesis this morning. She is taking the Bentyl( usually twice a day)  and took Zofran x 1 last night, which helped a little. Since yesterday she has only been taking in liquids. No changes in bowel habits. Ellouise Newer PA and Dr [name] are out of the office. Sending this to you as DOD.

## 2019-10-09 NOTE — Telephone Encounter (Signed)
Patient called is experiencing a lot of abdominal pain is wanting to come in to follow up but can not wait till next week or so would like advise to help her further.

## 2019-10-09 NOTE — Telephone Encounter (Signed)
Based upon the hx, her recent hernia repair and similar sxs I recommend she be evaluated in the Emergency department where she can get a CT scan quickly and we can find out if she has another hernia or other problem

## 2019-10-09 NOTE — Telephone Encounter (Signed)
Called and gave patient Dr. Celesta Aver recommendation to go to the ED and be evaluated and have a CT. Patient agreed.

## 2019-10-09 NOTE — Telephone Encounter (Signed)
Sherlynn Stalls is actually Tour manager.  I will send to [facility].  Thank you

## 2019-10-10 ENCOUNTER — Encounter (HOSPITAL_COMMUNITY): Payer: Self-pay

## 2019-10-10 ENCOUNTER — Other Ambulatory Visit: Payer: Self-pay

## 2019-10-10 ENCOUNTER — Emergency Department (HOSPITAL_COMMUNITY): Payer: Medicare Other

## 2019-10-10 ENCOUNTER — Inpatient Hospital Stay (HOSPITAL_COMMUNITY)
Admission: EM | Admit: 2019-10-10 | Discharge: 2019-10-18 | DRG: 391 | Disposition: A | Discharge status: Home Health | Payer: Medicare Other | Attending: Internal Medicine | Admitting: Internal Medicine

## 2019-10-10 DIAGNOSIS — K429 Umbilical hernia without obstruction or gangrene: Secondary | ICD-10-CM | POA: Diagnosis present

## 2019-10-10 DIAGNOSIS — Z841 Family history of disorders of kidney and ureter: Secondary | ICD-10-CM

## 2019-10-10 DIAGNOSIS — Z833 Family history of diabetes mellitus: Secondary | ICD-10-CM

## 2019-10-10 DIAGNOSIS — Z79899 Other long term (current) drug therapy: Secondary | ICD-10-CM

## 2019-10-10 DIAGNOSIS — K5732 Diverticulitis of large intestine without perforation or abscess without bleeding: Secondary | ICD-10-CM | POA: Diagnosis not present

## 2019-10-10 DIAGNOSIS — E1169 Type 2 diabetes mellitus with other specified complication: Secondary | ICD-10-CM | POA: Diagnosis present

## 2019-10-10 DIAGNOSIS — Z800 Family history of malignant neoplasm of digestive organs: Secondary | ICD-10-CM

## 2019-10-10 DIAGNOSIS — K5792 Diverticulitis of intestine, part unspecified, without perforation or abscess without bleeding: Secondary | ICD-10-CM

## 2019-10-10 DIAGNOSIS — Z7982 Long term (current) use of aspirin: Secondary | ICD-10-CM

## 2019-10-10 DIAGNOSIS — I5042 Chronic combined systolic (congestive) and diastolic (congestive) heart failure: Secondary | ICD-10-CM | POA: Diagnosis present

## 2019-10-10 DIAGNOSIS — Z885 Allergy status to narcotic agent status: Secondary | ICD-10-CM

## 2019-10-10 DIAGNOSIS — D631 Anemia in chronic kidney disease: Secondary | ICD-10-CM | POA: Diagnosis present

## 2019-10-10 DIAGNOSIS — Z794 Long term (current) use of insulin: Secondary | ICD-10-CM

## 2019-10-10 DIAGNOSIS — E663 Overweight: Secondary | ICD-10-CM | POA: Diagnosis present

## 2019-10-10 DIAGNOSIS — Z882 Allergy status to sulfonamides status: Secondary | ICD-10-CM

## 2019-10-10 DIAGNOSIS — N186 End stage renal disease: Secondary | ICD-10-CM | POA: Diagnosis present

## 2019-10-10 DIAGNOSIS — I152 Hypertension secondary to endocrine disorders: Secondary | ICD-10-CM | POA: Diagnosis present

## 2019-10-10 DIAGNOSIS — E785 Hyperlipidemia, unspecified: Secondary | ICD-10-CM | POA: Diagnosis present

## 2019-10-10 DIAGNOSIS — E876 Hypokalemia: Secondary | ICD-10-CM | POA: Diagnosis present

## 2019-10-10 DIAGNOSIS — E871 Hypo-osmolality and hyponatremia: Secondary | ICD-10-CM | POA: Diagnosis present

## 2019-10-10 DIAGNOSIS — E1159 Type 2 diabetes mellitus with other circulatory complications: Secondary | ICD-10-CM | POA: Diagnosis present

## 2019-10-10 DIAGNOSIS — Z6829 Body mass index (BMI) 29.0-29.9, adult: Secondary | ICD-10-CM

## 2019-10-10 DIAGNOSIS — E118 Type 2 diabetes mellitus with unspecified complications: Secondary | ICD-10-CM | POA: Diagnosis present

## 2019-10-10 DIAGNOSIS — R1032 Left lower quadrant pain: Secondary | ICD-10-CM

## 2019-10-10 DIAGNOSIS — Z20822 Contact with and (suspected) exposure to covid-19: Secondary | ICD-10-CM | POA: Diagnosis present

## 2019-10-10 DIAGNOSIS — E1165 Type 2 diabetes mellitus with hyperglycemia: Secondary | ICD-10-CM | POA: Diagnosis present

## 2019-10-10 DIAGNOSIS — E1122 Type 2 diabetes mellitus with diabetic chronic kidney disease: Secondary | ICD-10-CM | POA: Diagnosis present

## 2019-10-10 DIAGNOSIS — Z992 Dependence on renal dialysis: Secondary | ICD-10-CM

## 2019-10-10 DIAGNOSIS — Z888 Allergy status to other drugs, medicaments and biological substances status: Secondary | ICD-10-CM

## 2019-10-10 LAB — COMPREHENSIVE METABOLIC PANEL
ALT: 18 U/L (ref 0–44)
AST: 30 U/L (ref 15–41)
Albumin: 2.4 g/dL — ABNORMAL LOW (ref 3.5–5.0)
Alkaline Phosphatase: 55 U/L (ref 38–126)
Anion gap: 13 (ref 5–15)
BUN: 36 mg/dL — ABNORMAL HIGH (ref 6–20)
CO2: 27 mmol/L (ref 22–32)
Calcium: 8.3 mg/dL — ABNORMAL LOW (ref 8.9–10.3)
Chloride: 96 mmol/L — ABNORMAL LOW (ref 98–111)
Creatinine, Ser: 7.54 mg/dL — ABNORMAL HIGH (ref 0.44–1.00)
GFR calc Af Amer: 6 mL/min — ABNORMAL LOW (ref >60)
GFR calc non Af Amer: 5 mL/min — ABNORMAL LOW (ref >60)
Glucose, Bld: 198 mg/dL — ABNORMAL HIGH (ref 70–99)
Potassium: 3.1 mmol/L — ABNORMAL LOW (ref 3.5–5.1)
Sodium: 136 mmol/L (ref 135–145)
Total Bilirubin: 1.1 mg/dL (ref 0.3–1.2)
Total Protein: 6.6 g/dL (ref 6.5–8.1)

## 2019-10-10 LAB — URINALYSIS, ROUTINE W REFLEX MICROSCOPIC
Bilirubin Urine: NEGATIVE
Glucose, UA: 50 mg/dL — AB
Hgb urine dipstick: NEGATIVE
Ketones, ur: NEGATIVE mg/dL
Leukocytes,Ua: NEGATIVE
Nitrite: NEGATIVE
Protein, ur: >=300 mg/dL — AB
Specific Gravity, Urine: 1.013 (ref 1.005–1.030)
pH: 6 (ref 5.0–8.0)

## 2019-10-10 LAB — CBC WITH DIFFERENTIAL/PLATELET
Abs Immature Granulocytes: 0.1 10*3/uL — ABNORMAL HIGH (ref 0.00–0.07)
Basophils Absolute: 0 10*3/uL (ref 0.0–0.1)
Basophils Relative: 0 %
Eosinophils Absolute: 0.1 10*3/uL (ref 0.0–0.5)
Eosinophils Relative: 1 %
HCT: 30.7 % — ABNORMAL LOW (ref 36.0–46.0)
Hemoglobin: 9.9 g/dL — ABNORMAL LOW (ref 12.0–15.0)
Immature Granulocytes: 1 %
Lymphocytes Relative: 16 %
Lymphs Abs: 1.7 10*3/uL (ref 0.7–4.0)
MCH: 31.6 pg (ref 26.0–34.0)
MCHC: 32.2 g/dL (ref 30.0–36.0)
MCV: 98.1 fL (ref 80.0–100.0)
Monocytes Absolute: 0.6 10*3/uL (ref 0.1–1.0)
Monocytes Relative: 6 %
Neutro Abs: 8.3 10*3/uL — ABNORMAL HIGH (ref 1.7–7.7)
Neutrophils Relative %: 76 %
Platelets: 212 10*3/uL (ref 150–400)
RBC: 3.13 MIL/uL — ABNORMAL LOW (ref 3.87–5.11)
RDW: 14.5 % (ref 11.5–15.5)
WBC: 10.8 10*3/uL — ABNORMAL HIGH (ref 4.0–10.5)
nRBC: 0 % (ref 0.0–0.2)

## 2019-10-10 LAB — CBG MONITORING, ED
Glucose-Capillary: 130 mg/dL — ABNORMAL HIGH (ref 70–99)
Glucose-Capillary: 136 mg/dL — ABNORMAL HIGH (ref 70–99)

## 2019-10-10 LAB — MAGNESIUM: Magnesium: 1.7 mg/dL (ref 1.7–2.4)

## 2019-10-10 LAB — LIPASE, BLOOD: Lipase: 33 U/L (ref 11–51)

## 2019-10-10 MED ORDER — FENTANYL CITRATE (PF) 100 MCG/2ML IJ SOLN
50.0000 ug | Freq: Once | INTRAMUSCULAR | Status: AC
  Administered 2019-10-10: 50 ug via INTRAVENOUS
  Filled 2019-10-10: qty 2

## 2019-10-10 MED ORDER — ONDANSETRON HCL 4 MG/2ML IJ SOLN
4.0000 mg | Freq: Four times a day (QID) | INTRAMUSCULAR | Status: DC | PRN
  Administered 2019-10-13 – 2019-10-15 (×4): 4 mg via INTRAVENOUS
  Filled 2019-10-10 (×4): qty 2

## 2019-10-10 MED ORDER — ACETAMINOPHEN 325 MG PO TABS
650.0000 mg | ORAL_TABLET | Freq: Four times a day (QID) | ORAL | Status: DC | PRN
  Administered 2019-10-12: 650 mg via ORAL
  Filled 2019-10-10: qty 2

## 2019-10-10 MED ORDER — SPIRONOLACTONE 25 MG PO TABS
25.0000 mg | ORAL_TABLET | Freq: Every day | ORAL | Status: DC
  Administered 2019-10-10 – 2019-10-18 (×8): 25 mg via ORAL
  Filled 2019-10-10 (×10): qty 1

## 2019-10-10 MED ORDER — ISOSORBIDE MONONITRATE ER 30 MG PO TB24
15.0000 mg | ORAL_TABLET | Freq: Every day | ORAL | Status: DC
  Administered 2019-10-10 – 2019-10-18 (×9): 15 mg via ORAL
  Filled 2019-10-10 (×10): qty 1

## 2019-10-10 MED ORDER — METRONIDAZOLE IN NACL 5-0.79 MG/ML-% IV SOLN
500.0000 mg | Freq: Once | INTRAVENOUS | Status: AC
  Administered 2019-10-10: 500 mg via INTRAVENOUS
  Filled 2019-10-10: qty 100

## 2019-10-10 MED ORDER — POTASSIUM CHLORIDE 20 MEQ/15ML (10%) PO SOLN
20.0000 meq | Freq: Once | ORAL | Status: AC
  Administered 2019-10-10: 20 meq via ORAL
  Filled 2019-10-10: qty 15

## 2019-10-10 MED ORDER — ATORVASTATIN CALCIUM 10 MG PO TABS
10.0000 mg | ORAL_TABLET | Freq: Every day | ORAL | Status: DC
  Administered 2019-10-10 – 2019-10-18 (×9): 10 mg via ORAL
  Filled 2019-10-10 (×10): qty 1

## 2019-10-10 MED ORDER — HYDRALAZINE HCL 50 MG PO TABS
100.0000 mg | ORAL_TABLET | Freq: Two times a day (BID) | ORAL | Status: DC
  Administered 2019-10-10 – 2019-10-18 (×16): 100 mg via ORAL
  Filled 2019-10-10 (×17): qty 2

## 2019-10-10 MED ORDER — CARVEDILOL 25 MG PO TABS
25.0000 mg | ORAL_TABLET | Freq: Two times a day (BID) | ORAL | Status: DC
  Administered 2019-10-11 – 2019-10-18 (×15): 25 mg via ORAL
  Filled 2019-10-10 (×15): qty 1

## 2019-10-10 MED ORDER — INSULIN ASPART 100 UNIT/ML ~~LOC~~ SOLN
0.0000 [IU] | Freq: Three times a day (TID) | SUBCUTANEOUS | Status: DC
  Administered 2019-10-11 – 2019-10-14 (×4): 1 [IU] via SUBCUTANEOUS
  Administered 2019-10-14: 2 [IU] via SUBCUTANEOUS
  Administered 2019-10-15: 1 [IU] via SUBCUTANEOUS
  Administered 2019-10-15 – 2019-10-16 (×2): 2 [IU] via SUBCUTANEOUS
  Administered 2019-10-16: 1 [IU] via SUBCUTANEOUS
  Administered 2019-10-16: 08:00:00 4 [IU] via SUBCUTANEOUS
  Administered 2019-10-17: 2 [IU] via SUBCUTANEOUS
  Filled 2019-10-10: qty 0.06

## 2019-10-10 MED ORDER — FUROSEMIDE 80 MG PO TABS
80.0000 mg | ORAL_TABLET | Freq: Two times a day (BID) | ORAL | Status: DC
  Administered 2019-10-11 – 2019-10-13 (×6): 80 mg via ORAL
  Filled 2019-10-10 (×3): qty 1
  Filled 2019-10-10: qty 2
  Filled 2019-10-10 (×4): qty 1

## 2019-10-10 MED ORDER — SODIUM CHLORIDE 0.9 % IV SOLN
2.0000 g | Freq: Once | INTRAVENOUS | Status: AC
  Administered 2019-10-10: 2 g via INTRAVENOUS
  Filled 2019-10-10: qty 20

## 2019-10-10 MED ORDER — GABAPENTIN 100 MG PO CAPS
100.0000 mg | ORAL_CAPSULE | Freq: Every day | ORAL | Status: DC
  Administered 2019-10-10 – 2019-10-17 (×8): 100 mg via ORAL
  Filled 2019-10-10 (×8): qty 1

## 2019-10-10 MED ORDER — HYDROMORPHONE HCL 1 MG/ML IJ SOLN
0.5000 mg | INTRAMUSCULAR | Status: DC | PRN
  Administered 2019-10-12 – 2019-10-16 (×3): 0.5 mg via INTRAVENOUS
  Filled 2019-10-10 (×3): qty 1

## 2019-10-10 MED ORDER — OXYCODONE HCL 5 MG PO TABS
5.0000 mg | ORAL_TABLET | ORAL | Status: DC | PRN
  Administered 2019-10-11 – 2019-10-18 (×17): 5 mg via ORAL
  Filled 2019-10-10 (×19): qty 1

## 2019-10-10 MED ORDER — HEPARIN SODIUM (PORCINE) 5000 UNIT/ML IJ SOLN
5000.0000 [IU] | Freq: Three times a day (TID) | INTRAMUSCULAR | Status: DC
  Administered 2019-10-10 – 2019-10-18 (×22): 5000 [IU] via SUBCUTANEOUS
  Filled 2019-10-10 (×22): qty 1

## 2019-10-10 MED ORDER — CALCITRIOL 0.25 MCG PO CAPS
0.2500 ug | ORAL_CAPSULE | Freq: Every day | ORAL | Status: DC
  Administered 2019-10-11 – 2019-10-18 (×8): 0.25 ug via ORAL
  Filled 2019-10-10 (×9): qty 1

## 2019-10-10 MED ORDER — METRONIDAZOLE IN NACL 5-0.79 MG/ML-% IV SOLN
500.0000 mg | Freq: Three times a day (TID) | INTRAVENOUS | Status: DC
  Administered 2019-10-11 – 2019-10-18 (×23): 500 mg via INTRAVENOUS
  Filled 2019-10-10 (×23): qty 100

## 2019-10-10 MED ORDER — POTASSIUM CHLORIDE 20 MEQ/15ML (10%) PO SOLN: 60.0000 meq | Freq: Once | ORAL | Status: DC

## 2019-10-10 MED ORDER — SODIUM CHLORIDE 0.9 % IV SOLN
2.0000 g | INTRAVENOUS | Status: DC
  Administered 2019-10-11 – 2019-10-17 (×7): 2 g via INTRAVENOUS
  Filled 2019-10-10 (×3): qty 2
  Filled 2019-10-10: qty 20
  Filled 2019-10-10 (×4): qty 2

## 2019-10-10 MED ORDER — ACETAMINOPHEN 650 MG RE SUPP: 650.0000 mg | Freq: Four times a day (QID) | RECTAL | Status: DC | PRN

## 2019-10-10 MED ORDER — ONDANSETRON HCL 4 MG PO TABS
4.0000 mg | ORAL_TABLET | Freq: Four times a day (QID) | ORAL | Status: DC | PRN
  Administered 2019-10-14: 16:00:00 4 mg via ORAL
  Filled 2019-10-10: qty 1

## 2019-10-10 MED ORDER — ASPIRIN EC 81 MG PO TBEC
81.0000 mg | DELAYED_RELEASE_TABLET | Freq: Every day | ORAL | Status: DC
  Administered 2019-10-11 – 2019-10-18 (×8): 81 mg via ORAL
  Filled 2019-10-10 (×8): qty 1

## 2019-10-10 MED ORDER — LISINOPRIL 40 MG PO TABS
40.0000 mg | ORAL_TABLET | Freq: Every day | ORAL | Status: DC
  Administered 2019-10-10 – 2019-10-18 (×9): 40 mg via ORAL
  Filled 2019-10-10 (×4): qty 1
  Filled 2019-10-10 (×2): qty 2
  Filled 2019-10-10 (×4): qty 1

## 2019-10-10 NOTE — ED Provider Notes (Addendum)
[facility] DEPT Provider Note   CSN: [id number] Arrival date & time: 10/10/19  1202     History Chief Complaint  Patient presents with  . Abdominal Pain    Patricia [name] is a 63 y.o. female.  63 y.o female with a PMh of ESRD, CHF, CKD currently on nightly peritoneal dialysis presents to the ED with a chief complaint of abdominal pain x 2 weeks. Patient describes the pain as intermittent cramping sensation to the LLQ worse with lying on the right side of her body. No alleviating factors.  To take Tylenol, drink to ensure, without any improvement in symptoms.  Nausea but has not had any episodes of vomiting.  Patient does have a prior history of a hernia repair in September 2020, reports this feels somewhat like it.  Was this morning without any blood.  She denies any fever, vomiting, sick contacts, urinary symptoms. Of note, patient does still make urine, no urinary complaints.  The history is provided by the patient.  Abdominal Pain Associated symptoms: nausea   Associated symptoms: no chest pain, no constipation, no diarrhea, no fever, no shortness of breath, no sore throat and no vomiting        Past Medical History:  Diagnosis Date  . Allergy   . Anemia   . Anxiety   . Blood transfusion without reported diagnosis   . Cardiomyopathy ([place]) 11/2016   no ischemic eval due to CKD  . CHF (congestive heart failure) ([place])   . CKD (chronic kidney disease), stage IV ([place])   . Depression   . Diabetes mellitus without complication ([place])    type 2  . Dyspnea   . Hypertension   . Obesity     Patient Active Problem List   Diagnosis Date Noted  . ESRD on peritoneal dialysis ([place]) 07/06/2019  . ESRD (end stage renal disease) ([place]) 01/20/2019  . CHF (congestive heart failure) ([place]) 12/06/2018  . Fluid overload 12/05/2018  . Type II diabetes mellitus with renal manifestations ([place]) 12/05/2018  . Hypoglycemia 12/05/2018  . Acute on chronic combined  systolic and diastolic CHF (congestive heart failure) ([place]) 06/14/2018  . CKD (chronic kidney disease) stage 5, GFR less than 15 ml/min (H[facility]) 06/14/2018  . Anemia due to stage 5 chronic kidney disease, not on chronic dialysis ([place]) 06/14/2018  . Systolic CHF ([place]) 69/48/5462  . Diabetes mellitus type 2 with complications ([place]) 70/35/0093  . Transaminasemia 11/29/2016  . Cardiomyopathy ([place]) 11/12/2016  . Lipoma 10/21/2016  . CKD (chronic kidney disease) stage 4, GFR 15-29 ml/min (H[facility]) 08/10/2016  . Anxiety and depression 12/12/2015  . Diabetic nephropathy associated with type 2 diabetes mellitus ([place]) 11/15/2014  . Moderate nonproliferative diabetic retinopathy(362.05) 04/17/2014  . 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 Essential hypertension 03/28/2014  . Dental caries 03/28/2014  . Dyslipidemia 04/18/2013    Past Surgical History:  Procedure Laterality Date  . ABDOMINAL HYSTERECTOMY    . BREAST SURGERY     reduction  . [place]  . LAPAROSCOPY N/A 06/16/2019   Procedure: primary closure of umbilical hernia;  Surgeon: Ralene Ok, MD;  Location: [facility];  Service: General;  Laterality: N/A;  . LIPOMA EXCISION     back  Dr. Excell Seltzer 03-22-18  . LIPOMA EXCISION N/A 03/22/2018   Procedure: EXCISION OF BACK LIPOMA;  Surgeon: Excell Seltzer, MD;  Location: [facility];  Service: General;  Laterality: N/A;  . REDUCTION MAMMAPLASTY Bilateral      OB History  Gravida  2   Para  2   Term  2   Preterm      AB      Living  2     SAB      TAB      Ectopic      Multiple      Live Births              Family History  Problem Relation Age of Onset  . Diabetes Brother   . Stomach cancer Brother   . Kidney disease Brother   . Heart Problems Maternal Grandmother        pacemaker  . Heart disease Maternal Grandfather   . Rectal cancer Neg Hx   . Esophageal cancer Neg Hx   . Liver cancer Neg Hx   . Colon cancer Neg Hx     Social  History   Tobacco Use  . Smoking status: Never Smoker  . Smokeless tobacco: Never Used  Substance Use Topics  . Alcohol use: No  . Drug use: No    Home Medications Prior to Admission medications   Medication Sig Start Date End Date Taking? Authorizing Provider  acetaminophen (TYLENOL) 325 MG tablet Take 2 tablets (650 mg total) by mouth every 6 (six) hours as needed. Patient taking differently: Take 650 mg by mouth every 6 (six) hours as needed for headache (pain).  05/25/19  Yes Varney Biles, MD  albuterol (PROVENTIL HFA;VENTOLIN HFA) 108 (90 Base) MCG/ACT inhaler Inhale 1-2 puffs into the lungs every 6 (six) hours as needed for wheezing or shortness of breath. 10/11/18  Yes Marney Setting, NP  aspirin EC 81 MG tablet Take 1 tablet (81 mg total) by mouth daily. 01/04/17  Yes Langeland, Dawn T, MD  atorvastatin (LIPITOR) 20 MG tablet Take 1/2 (one-half) tablet by mouth once daily Patient taking differently: Take 10 mg by mouth daily.  06/12/19  Yes Ladell Pier, MD  Blood Glucose Monitoring Suppl (ACCU-CHEK GUIDE) w/Device KIT 1 each by Does not apply route 3 (three) times daily. Use tid as directed 10/21/18  Yes Ladell Pier, MD  calcitRIOL (ROCALTROL) 0.25 MCG capsule Take 0.25 mcg by mouth daily. 09/12/19  Yes [provider]  carvedilol (COREG) 25 MG tablet TAKE 1 TABLET BY MOUTH TWICE DAILY WITH A MEAL Patient taking differently: Take 25 mg by mouth 2 (two) times daily with a meal.  05/04/19  Yes Croitoru, Mihai, MD  dicyclomine (BENTYL) 10 MG capsule Take 1 capsule (10 mg total) by mouth every 6 (six) hours as needed for spasms. 06/16/19  Yes Levin Erp, PA  furosemide (LASIX) 80 MG tablet Take 1 tablet (80 mg total) by mouth 2 (two) times daily. 12/07/18  Yes Elgergawy, Silver Huguenin, MD  gabapentin (NEURONTIN) 100 MG capsule TAKE 1 CAPSULE (100 MG TOTAL) BY MOUTH AT BEDTIME. 12/05/18  Yes Ladell Pier, MD  glipiZIDE (GLUCOTROL) 10 MG tablet Take 10 mg  by mouth 2 (two) times daily. 08/15/19  Yes [provider]  glucose 4 GM chewable tablet Chew 1 tablet by mouth once as needed for low blood sugar.    Yes [provider]  glucose blood (ACCU-CHEK GUIDE) test strip Use as instructed tid 10/21/18  Yes Ladell Pier, MD  hydrALAZINE (APRESOLINE) 100 MG tablet Take 1 tablet (100 mg total) by mouth 2 (two) times daily. 06/20/19 10/10/19 Yes [place] Eric J, DO  Insulin Pen Needle (TRUEPLUS PEN NEEDLES) 32G X 4 MM  MISC Use as directed to inject insulin. 03/25/18  Yes Ladell Pier, MD  Insulin Pen Needle 31G X 5 MM MISC Use as directed 03/28/18  Yes Ladell Pier, MD  isosorbide mononitrate (IMDUR) 30 MG 24 hr tablet Take 1/2 (one-half) tablet by mouth once daily Patient taking differently: Take 15 mg by mouth daily.  06/12/19  Yes Croitoru, Mihai, MD  lisinopril (ZESTRIL) 40 MG tablet Take 40 mg by mouth daily. 09/19/19  Yes [provider]  ondansetron (ZOFRAN ODT) 4 MG disintegrating tablet Take every 4-6 hours as needed for nausea Patient taking differently: Take 4 mg by mouth every 4 (four) hours as needed for nausea or vomiting.  06/16/19  Yes Lemmon, Lavone Nian, PA  Polyethyl Glycol-Propyl Glycol (SYSTANE) 0.4-0.3 % SOLN Place 1 drop into both eyes 3 (three) times daily as needed (dry eyes).    Yes [provider]  sevelamer (RENAGEL) 800 MG tablet Take 1,600-3,200 mg by mouth See admin instructions. Take 2-4 tablets (1600-3200 mg) by mouth up to three times daily with meals - 2-3 tablets with small meal, 4 tablets with large meal   Yes [provider]  spironolactone (ALDACTONE) 25 MG tablet Take 25 mg by mouth daily. 09/19/19  Yes [provider]  ACCU-CHEK FASTCLIX LANCETS MISC Uad tid 10/21/18   Ladell Pier, MD  gentamicin cream (GARAMYCIN) 0.1 % Apply 1 application topically See admin instructions. Apply topically to dialysis site after showering - every other day 06/10/19    [provider]  insulin aspart (NOVOLOG) 100 UNIT/ML injection 3 units before meals. Patient not taking: Reported on 10/10/2019 04/06/17   Ladell Pier, MD  Insulin Glargine (LANTUS SOLOSTAR) 100 UNIT/ML Solostar Pen Inject 4 Units into the skin at bedtime. Patient not taking: Reported on 10/10/2019 12/07/18   Elgergawy, Silver Huguenin, MD    Allergies    Codone [hydrocodone], Norvasc [amlodipine besylate], Hydralazine, and Sulfa antibiotics  Review of Systems   Review of Systems  Constitutional: Negative for fever.  HENT: Negative for sore throat.   Respiratory: Negative for shortness of breath.   Cardiovascular: Negative for chest pain.  Gastrointestinal: Positive for abdominal pain and nausea. Negative for anal bleeding, blood in stool, constipation, diarrhea, rectal pain and vomiting.  Genitourinary: Negative for flank pain.  Musculoskeletal: Negative for back pain.  Skin: Negative for pallor.  Neurological: Negative for light-headedness and headaches.    Physical Exam Updated Vital Signs BP (!) 186/72 (BP Location: Left Arm)   Pulse 66   Temp 98.1 F (36.7 C) (Oral)   Resp 14   SpO2 99%   Physical Exam Vitals and nursing note reviewed.  Constitutional:      General: She is not in acute distress.    Appearance: She is well-developed.  HENT:     Head: Normocephalic and atraumatic.     Mouth/Throat:     Pharynx: No oropharyngeal exudate.  Eyes:     Pupils: Pupils are equal, round, and reactive to light.  Cardiovascular:     Rate and Rhythm: Regular rhythm.     Heart sounds: Normal heart sounds.  Pulmonary:     Effort: Pulmonary effort is normal. No respiratory distress.     Breath sounds: Normal breath sounds.  Abdominal:     General: Bowel sounds are increased. There is no distension.     Palpations: Abdomen is soft.     Tenderness: There is generalized abdominal tenderness and tenderness in the right lower quadrant, suprapubic  area and left lower  quadrant. There is guarding. There is no right CVA tenderness or left CVA tenderness.     Hernia: No hernia is present.       Comments: Bowel sounds are hyperactive, significant tenderness to palpation throughout stomach but with focal tenderness on the left lower quadrant.  Peritoneal dressing in place.  Musculoskeletal:        General: No tenderness or deformity.     Cervical back: Normal range of motion.     Right lower leg: No edema.     Left lower leg: No edema.  Skin:    General: Skin is warm and dry.  Neurological:     Mental Status: She is alert and oriented to person, place, and time.     ED Results / Procedures / Treatments   Labs (all labs ordered are listed, but only abnormal results are displayed) Labs Reviewed  CBC WITH DIFFERENTIAL/PLATELET - Abnormal; Notable for the following components:      Result Value   WBC 10.8 (*)    RBC 3.13 (*)    Hemoglobin 9.9 (*)    HCT 30.7 (*)    Neutro Abs 8.3 (*)    Abs Immature Granulocytes 0.10 (*)    All other components within normal limits  COMPREHENSIVE METABOLIC PANEL - Abnormal; Notable for the following components:   Potassium 3.1 (*)    Chloride 96 (*)    Glucose, Bld 198 (*)    BUN 36 (*)    Creatinine, Ser 7.54 (*)    Calcium 8.3 (*)    Albumin 2.4 (*)    GFR calc non Af Amer 5 (*)    GFR calc Af Amer 6 (*)    All other components within normal limits  URINALYSIS, ROUTINE W REFLEX MICROSCOPIC - Abnormal; Notable for the following components:   Glucose, UA 50 (*)    Protein, ur >=300 (*)    Bacteria, UA RARE (*)    All other components within normal limits  SARS CORONAVIRUS 2 (TAT 6-24 HRS)  LIPASE, BLOOD    EKG None  Radiology CT ABDOMEN PELVIS WO CONTRAST  Result Date: 10/10/2019 CLINICAL DATA:  Left lower quadrant abdominal pain. EXAM: CT ABDOMEN AND PELVIS WITHOUT CONTRAST TECHNIQUE: Multidetector CT imaging of the abdomen and pelvis was performed following the standard protocol without IV  contrast. COMPARISON:  June 16, 2019.  May 25, 2019. FINDINGS: Lower chest: No acute abnormality. Hepatobiliary: No focal liver abnormality is seen. No gallstones, gallbladder wall thickening, or biliary dilatation. Pancreas: Unremarkable. No pancreatic ductal dilatation or surrounding inflammatory changes. Spleen: Normal in size without focal abnormality. Adrenals/Urinary Tract: Adrenal glands are unremarkable. Kidneys are normal, without renal calculi, focal lesion, or hydronephrosis. Bladder is unremarkable. Stomach/Bowel: The stomach is unremarkable. The appendix appears normal. There is no evidence of bowel obstruction. There is continued wall thickening and inflammation involving the proximal sigmoid colon consistent with diverticulitis. Vascular/Lymphatic: Aortic atherosclerosis. No enlarged abdominal or pelvic lymph nodes. Reproductive: Status post hysterectomy. No adnexal masses. Other: There is continued presence of pelvic drainage catheter in the anterior pelvis. No fluid collection is noted around its distal tip. However there is noted fluid surrounding its portion just before it enters the peritoneal space. Also noted is continued presence of moderate size periumbilical hernia which contains loops of small bowel, but does not result in obstruction or incarceration. Musculoskeletal: No acute or significant osseous findings. IMPRESSION: Continued presence of proximal sigmoid diverticulitis. No definite abscess is noted at  this time. Continued presence of pigtail drainage catheter seen in the anterior portion of the pelvis. No fluid collection is noted around its distal portion, but fluid is noted around this portion before it enters the peritoneal space which was present on prior exam. Continued presence of moderate size periumbilical hernia which contains loops of small bowel, but does not result in incarceration or obstruction. Electronically Signed   By: Marijo Conception M.D.   On: 10/10/2019  15:57    Procedures Procedures (including critical care time)  Medications Ordered in ED Medications  cefTRIAXone (ROCEPHIN) 2 g in sodium chloride 0.9 % 100 mL IVPB (2 g Intravenous New Bag/Given 10/10/19 1837)    And  metroNIDAZOLE (FLAGYL) IVPB 500 mg (500 mg Intravenous New Bag/Given 10/10/19 1838)  fentaNYL (SUBLIMAZE) injection 50 mcg (50 mcg Intravenous Given 10/10/19 1503)  fentaNYL (SUBLIMAZE) injection 50 mcg (50 mcg Intravenous Given 10/10/19 1834)    ED Course  I have reviewed the triage vital signs and the nursing notes.  Pertinent labs & imaging results that were available during my care of the patient were reviewed by me and considered in my medical decision making (see chart for details).    MDM Rules/Calculators/A&P  Of CKD, CHF, strangulated hernia September 2020 presents to the ED with complaints of abdominal pain throughout her whole abdomen with focal point on the left lower quadrant.  She reports this feels somewhat likely when she had a strangulated hernia.  She did have this repair then.  She denies any fever, has had some nausea but no vomiting.  No diarrhea, last bowel movement was normal and it was yesterday.  During my evaluation there is significant tenderness throughout the whole abdomen, more so in the left lower quadrant.  Vitals are within normal limits, she is afebrile.  Blood work will be obtained along with CT of her abdomen.  I have review her chart and see she contacted her GI physician who instructed patient to be seen in the emergency department.  She was also provided with fentanyl 50 mics to help with her symptoms.  Of note, patient does still make urine.  CBC with a mild leukocytosis at 10.8, hemoglobin improved from previous visit.  CMP with mild hypokalemia.  Creatinine level is at her baseline at 7.5.  Lipase is within normal limits.  Abdomen is very tender to palpation on my exam.  A CT of the abdomen has been ordered, she is unable to receive  contrast due to her creatinine function.  Patient is followed by Dr. Arelia Longest of [facility].  CT abdomen pelvis showed:  Continued presence of proximal sigmoid diverticulitis. No definite  abscess is noted at this time.    Continued presence of pigtail drainage catheter seen in the anterior  portion of the pelvis. No fluid collection is noted around its  distal portion, but fluid is noted around this portion before it  enters the peritoneal space which was present on prior exam.    Continued presence of moderate size periumbilical hernia which  contains loops of small bowel, but does not result in incarceration  or obstruction.     Will place call to [facility] for further recommendations.   5:15 PM Spoke to daughter Oletta Lamas who reports "my mothers fluid is clear, she has no infection", its the hernia. Patients daughter was advised that she will be called back once update on status.  6:11 PM Spoke to Dr. Rush Landmark from GI who recommended patient be admitted for  IV antibiotics, liquid diet tonight and tomorrow.  She might need a KUB tomorrow morning as patient does have a previous history of strangulated hernia, she is currently requesting pain medication, will be provided with more fentanyl.  Covid testing has been ordered, patient will also receive IV antibiotics.  7:11 PM spoke to Dr. Posey Pronto hospitalist service will admit patient for further management of her diverticulitis. I have also spoken to daughter who was informed of plan and management. They are requesting updates as needed.    Portions of this note were generated with [employer]. Dictation errors may occur despite best attempts at proofreading.  Final Clinical Impression(s) / ED Diagnoses Final diagnoses:  Left lower quadrant abdominal pain  Diverticulitis    Rx / DC Orders ED Discharge Orders    None       Corinna Capra 10/10/19 1915    Wyvonnia Dusky, MD 10/10/19 2118    Janeece Fitting, PA-C 10/24/19 1708    Wyvonnia Dusky, MD 10/25/19 1057

## 2019-10-10 NOTE — H&P (Signed)
History and Physical    Patricia [name] CWC:[id number] DOB: April 26, 1956 DOA: 10/10/2019  PCP: Ladell Pier, MD  Patient coming from: Home  I have personally briefly reviewed patient's old medical records in [place]  Chief Complaint: Left lower quadrant abdominal pain  HPI: NGA Patricia [name] is a 63 y.o. female with medical history significant for ESRD on peritoneal dialysis, chronic combined systolic and diastolic CHF (EF 76-16% by echocardiogram on 09/06/2019), insulin-dependent type 2 diabetes, hypertension, anemia of chronic disease, and recent laparoscopic repair of incarcerated umbilical hernia on June 16, 2019 who presents to the ED for evaluation of abdominal pain.  Patient reports having 2 weeks of left lower quadrant abdominal pain that has been progressively worsening.  She has had poor oral intake with nausea but has not had any emesis.  She reports having recent loose stools without watery diarrhea.  She has not seen any blood in her stool.  She denies any subjective fevers but is having night sweats and chills.  She felt her pain was similar to her prior hernia pain.  She called her GI office who recommended she come to the ED for further evaluation.  She otherwise denies any chest pain, dyspnea, cough, dysuria, or peripheral edema.  ED Course:  Initial vitals showed BP 132/64, pulse 74, RR 16, temp 98.1 Fahrenheit, SPO2 99% on room air.  Labs are notable for WBC 10.8, hemoglobin 9.9, platelets 212,000, sodium 136, potassium 3.1, BUN 36, creatinine 7.54, lipase 33.  Urinalysis showed negative nitrites, negative leukocytes, 0-5 RBCs, 6-10 WBCs, rare bacteria on microscopy.  SARS-CoV-2 PCR test was collected and pending.  CT abdomen/pelvis without contrast showed proximal sigmoid diverticulitis without definite abscess.  Continue presence of moderate sized umbilical hernia which contains loops of small bowel are seen without evidence of incarceration or obstruction.   Pigtail drainage catheter seen in the anterior portion of the pelvis.  EDP discussed the case with on-call GI who recommended admission for IV antibiotics and liquid diet.  Patient was given IV ceftriaxone and Flagyl and the hospitalist service was consulted admit for further evaluation and management.  Review of Systems: All systems reviewed and are negative except as documented in history of present illness above.   Past Medical History:  Diagnosis Date  . Allergy   . Anemia   . Anxiety   . Blood transfusion without reported diagnosis   . Cardiomyopathy ([place]) 11/2016   no ischemic eval due to CKD  . CHF (congestive heart failure) ([place])   . CKD (chronic kidney disease), stage IV ([place])   . Depression   . Diabetes mellitus without complication ([place])    type 2  . Dyspnea   . Hypertension   . Obesity     Past Surgical History:  Procedure Laterality Date  . ABDOMINAL HYSTERECTOMY    . BREAST SURGERY     reduction  . [place]  . LAPAROSCOPY N/A 06/16/2019   Procedure: primary closure of umbilical hernia;  Surgeon: Patricia Ok, MD;  Location: [facility];  Service: General;  Laterality: N/A;  . LIPOMA EXCISION     back  Dr. Excell Seltzer 03-22-18  . LIPOMA EXCISION N/A 03/22/2018   Procedure: EXCISION OF BACK LIPOMA;  Surgeon: Excell Seltzer, MD;  Location: [facility];  Service: General;  Laterality: N/A;  . REDUCTION MAMMAPLASTY Bilateral     Social History:  reports that she has never smoked. She has never used smokeless tobacco. She reports that she does  not drink alcohol or use drugs.  Allergies  Allergen Reactions  . Codone [Hydrocodone] Itching  . Norvasc [Amlodipine Besylate] Swelling    Lower extremity  . Hydralazine Other (See Comments) and Swelling    Leg swelling  . Sulfa Antibiotics Itching and Rash    Family History  Problem Relation Age of Onset  . Diabetes Brother   . Stomach cancer Brother   . Kidney disease Brother   . Heart Problems  Maternal Grandmother        pacemaker  . Heart disease Maternal Grandfather   . Rectal cancer Neg Hx   . Esophageal cancer Neg Hx   . Liver cancer Neg Hx   . Colon cancer Neg Hx      Prior to Admission medications   Medication Sig Start Date End Date Taking? Authorizing Provider  acetaminophen (TYLENOL) 325 MG tablet Take 2 tablets (650 mg total) by mouth every 6 (six) hours as needed. Patient taking differently: Take 650 mg by mouth every 6 (six) hours as needed for headache (pain).  05/25/19  Yes Varney Biles, MD  albuterol (PROVENTIL HFA;VENTOLIN HFA) 108 (90 Base) MCG/ACT inhaler Inhale 1-2 puffs into the lungs every 6 (six) hours as needed for wheezing or shortness of breath. 10/11/18  Yes Marney Setting, NP  aspirin EC 81 MG tablet Take 1 tablet (81 mg total) by mouth daily. 01/04/17  Yes Langeland, Dawn T, MD  atorvastatin (LIPITOR) 20 MG tablet Take 1/2 (one-half) tablet by mouth once daily Patient taking differently: Take 10 mg by mouth daily.  06/12/19  Yes Ladell Pier, MD  Blood Glucose Monitoring Suppl (ACCU-CHEK GUIDE) w/Device KIT 1 each by Does not apply route 3 (three) times daily. Use tid as directed 10/21/18  Yes Ladell Pier, MD  calcitRIOL (ROCALTROL) 0.25 MCG capsule Take 0.25 mcg by mouth daily. 09/12/19  Yes [provider]  carvedilol (COREG) 25 MG tablet TAKE 1 TABLET BY MOUTH TWICE DAILY WITH A MEAL Patient taking differently: Take 25 mg by mouth 2 (two) times daily with a meal.  05/04/19  Yes Croitoru, Mihai, MD  dicyclomine (BENTYL) 10 MG capsule Take 1 capsule (10 mg total) by mouth every 6 (six) hours as needed for spasms. 06/16/19  Yes Levin Erp, PA  furosemide (LASIX) 80 MG tablet Take 1 tablet (80 mg total) by mouth 2 (two) times daily. 12/07/18  Yes Elgergawy, Silver Huguenin, MD  gabapentin (NEURONTIN) 100 MG capsule TAKE 1 CAPSULE (100 MG TOTAL) BY MOUTH AT BEDTIME. 12/05/18  Yes Ladell Pier, MD  glipiZIDE (GLUCOTROL) 10 MG  tablet Take 10 mg by mouth 2 (two) times daily. 08/15/19  Yes [provider]  glucose 4 GM chewable tablet Chew 1 tablet by mouth once as needed for low blood sugar.    Yes [provider]  glucose blood (ACCU-CHEK GUIDE) test strip Use as instructed tid 10/21/18  Yes Ladell Pier, MD  hydrALAZINE (APRESOLINE) 100 MG tablet Take 1 tablet (100 mg total) by mouth 2 (two) times daily. 06/20/19 10/10/19 Yes [place] Eric J, DO  Insulin Pen Needle (TRUEPLUS PEN NEEDLES) 32G X 4 MM MISC Use as directed to inject insulin. 03/25/18  Yes Ladell Pier, MD  Insulin Pen Needle 31G X 5 MM MISC Use as directed 03/28/18  Yes Ladell Pier, MD  isosorbide mononitrate (IMDUR) 30 MG 24 hr tablet Take 1/2 (one-half) tablet by mouth once daily Patient taking differently: Take 15 mg by mouth daily.  06/12/19  Yes Croitoru, Mihai, MD  lisinopril (ZESTRIL) 40 MG tablet Take 40 mg by mouth daily. 09/19/19  Yes [provider]  ondansetron (ZOFRAN ODT) 4 MG disintegrating tablet Take every 4-6 hours as needed for nausea Patient taking differently: Take 4 mg by mouth every 4 (four) hours as needed for nausea or vomiting.  06/16/19  Yes Lemmon, Lavone Nian, PA  Polyethyl Glycol-Propyl Glycol (SYSTANE) 0.4-0.3 % SOLN Place 1 drop into both eyes 3 (three) times daily as needed (dry eyes).    Yes [provider]  sevelamer (RENAGEL) 800 MG tablet Take 1,600-3,200 mg by mouth See admin instructions. Take 2-4 tablets (1600-3200 mg) by mouth up to three times daily with meals - 2-3 tablets with small meal, 4 tablets with large meal   Yes [provider]  spironolactone (ALDACTONE) 25 MG tablet Take 25 mg by mouth daily. 09/19/19  Yes [provider]  ACCU-CHEK FASTCLIX LANCETS MISC Uad tid 10/21/18   Ladell Pier, MD  gentamicin cream (GARAMYCIN) 0.1 % Apply 1 application topically See admin instructions. Apply topically to dialysis site after showering - every  other day 06/10/19   [provider]  insulin aspart (NOVOLOG) 100 UNIT/ML injection 3 units before meals. Patient not taking: Reported on 10/10/2019 04/06/17   Ladell Pier, MD  Insulin Glargine (LANTUS SOLOSTAR) 100 UNIT/ML Solostar Pen Inject 4 Units into the skin at bedtime. Patient not taking: Reported on 10/10/2019 12/07/18   Elgergawy, Silver Huguenin, MD    Physical Exam: Vitals:   10/10/19 1508 10/10/19 1745 10/10/19 2000 10/10/19 2015  BP: (!) 189/76 (!) 186/72 (!) 176/70 (!) 181/67  Pulse: 74 66 64 63  Resp: 15 14    Temp:      TempSrc:      SpO2: 100% 99% 97% 100%    Constitutional: Resting supine in bed, NAD, calm, comfortable Eyes: PERRL, lids and conjunctivae normal ENMT: Mucous membranes are moist. Posterior pharynx clear of any exudate or lesions.Normal dentition.  Neck: normal, supple, no masses. Respiratory: clear to auscultation bilaterally, no wheezing, no crackles. Normal respiratory effort. No accessory muscle use.  Cardiovascular: Regular rate and rhythm, no murmurs / rubs / gallops. No extremity edema. 2+ pedal pulses. Abdomen: Left lower quadrant tenderness, no masses palpated. No hepatosplenomegaly. Bowel sounds positive.  PD catheter in place right abdomen. Musculoskeletal: no clubbing / cyanosis. No joint deformity upper and lower extremities. Good ROM, no contractures. Normal muscle tone.  Skin: no rashes, lesions, ulcers. No induration Neurologic: CN 2-12 grossly intact. Sensation intact, Strength 5/5 in all 4.  Psychiatric: Normal judgment and insight. Alert and oriented x 3. Normal mood.     Labs on Admission: I have personally reviewed following labs and imaging studies  CBC: Recent Labs  Lab 10/10/19 1418  WBC 10.8*  NEUTROABS 8.3*  HGB 9.9*  HCT 30.7*  MCV 98.1  PLT 034   Basic Metabolic Panel: Recent Labs  Lab 10/10/19 1418  NA 136  K 3.1*  CL 96*  CO2 27  GLUCOSE 198*  BUN 36*  CREATININE 7.54*  CALCIUM 8.3*    GFR: CrCl cannot be calculated (Unknown ideal weight.). Liver Function Tests: Recent Labs  Lab 10/10/19 1418  AST 30  ALT 18  ALKPHOS 55  BILITOT 1.1  PROT 6.6  ALBUMIN 2.4*   Recent Labs  Lab 10/10/19 1418  LIPASE 33   No results for input(s): AMMONIA in the last 168 hours. Coagulation Profile: No results for input(s):  INR, PROTIME in the last 168 hours. Cardiac Enzymes: No results for input(s): CKTOTAL, CKMB, CKMBINDEX, TROPONINI in the last 168 hours. BNP (last 3 results) No results for input(s): PROBNP in the last 8760 hours. HbA1C: No results for input(s): HGBA1C in the last 72 hours. CBG: No results for input(s): GLUCAP in the last 168 hours. Lipid Profile: No results for input(s): CHOL, HDL, LDLCALC, TRIG, CHOLHDL, LDLDIRECT in the last 72 hours. Thyroid Function Tests: No results for input(s): TSH, T4TOTAL, FREET4, T3FREE, THYROIDAB in the last 72 hours. Anemia Panel: No results for input(s): VITAMINB12, FOLATE, FERRITIN, TIBC, IRON, RETICCTPCT in the last 72 hours. Urine analysis:    Component Value Date/Time   COLORURINE YELLOW 10/10/2019 1801   APPEARANCEUR CLEAR 10/10/2019 1801   LABSPEC 1.013 10/10/2019 1801   PHURINE 6.0 10/10/2019 1801   GLUCOSEU 50 (A) 10/10/2019 1801   HGBUR NEGATIVE 10/10/2019 1801   BILIRUBINUR NEGATIVE 10/10/2019 1801   BILIRUBINUR 1.0 10/09/2016 1306   KETONESUR NEGATIVE 10/10/2019 1801   PROTEINUR >=300 (A) 10/10/2019 1801   UROBILINOGEN 0.2 04/24/2018 1529   NITRITE NEGATIVE 10/10/2019 1801   LEUKOCYTESUR NEGATIVE 10/10/2019 1801    Radiological Exams on Admission: CT ABDOMEN PELVIS WO CONTRAST  Result Date: 10/10/2019 CLINICAL DATA:  Left lower quadrant abdominal pain. EXAM: CT ABDOMEN AND PELVIS WITHOUT CONTRAST TECHNIQUE: Multidetector CT imaging of the abdomen and pelvis was performed following the standard protocol without IV contrast. COMPARISON:  June 16, 2019.  May 25, 2019. FINDINGS: Lower chest: No  acute abnormality. Hepatobiliary: No focal liver abnormality is seen. No gallstones, gallbladder wall thickening, or biliary dilatation. Pancreas: Unremarkable. No pancreatic ductal dilatation or surrounding inflammatory changes. Spleen: Normal in size without focal abnormality. Adrenals/Urinary Tract: Adrenal glands are unremarkable. Kidneys are normal, without renal calculi, focal lesion, or hydronephrosis. Bladder is unremarkable. Stomach/Bowel: The stomach is unremarkable. The appendix appears normal. There is no evidence of bowel obstruction. There is continued wall thickening and inflammation involving the proximal sigmoid colon consistent with diverticulitis. Vascular/Lymphatic: Aortic atherosclerosis. No enlarged abdominal or pelvic lymph nodes. Reproductive: Status post hysterectomy. No adnexal masses. Other: There is continued presence of pelvic drainage catheter in the anterior pelvis. No fluid collection is noted around its distal tip. However there is noted fluid surrounding its portion just before it enters the peritoneal space. Also noted is continued presence of moderate size periumbilical hernia which contains loops of small bowel, but does not result in obstruction or incarceration. Musculoskeletal: No acute or significant osseous findings. IMPRESSION: Continued presence of proximal sigmoid diverticulitis. No definite abscess is noted at this time. Continued presence of pigtail drainage catheter seen in the anterior portion of the pelvis. No fluid collection is noted around its distal portion, but fluid is noted around this portion before it enters the peritoneal space which was present on prior exam. Continued presence of moderate size periumbilical hernia which contains loops of small bowel, but does not result in incarceration or obstruction. Electronically Signed   By: Marijo Conception M.D.   On: 10/10/2019 15:57    EKG: Not performed.  Assessment/Plan Principal Problem:   Sigmoid  diverticulitis Active Problems:   Hypertension associated with diabetes ([place])   Diabetes mellitus type 2 with complications ([place])   CHF (congestive heart failure) ([place])   Periumbilical hernia   ESRD on peritoneal dialysis ([place])  Scarlett NAIMA VELDHUIZEN is a 63 y.o. female with medical history significant for ESRD on peritoneal dialysis, chronic combined systolic and diastolic CHF (EF 66-59% by  echocardiogram on 09/06/2019), insulin-dependent type 2 diabetes, hypertension, anemia of chronic disease, and recent laparoscopic repair of incarcerated umbilical hernia on June 16, 2019 who is admitted with sigmoid diverticulitis.  Sigmoid diverticulitis: -Continue IV antibiotics with ceftriaxone and Flagyl -Continue liquid diet -Continue antiemetics and pain control as needed -GI to follow  Periumbilical hernia: S/p laparoscopic repair of incarcerated umbilical hernia 12/15/44.  CT shows continued presence of moderate size periumbilical hernia containing loops of small bowel without evidence of incarceration or obstruction. -Will check KUB in a.m.  ESRD on peritoneal dialysis: Currently stable without emergent dialysis need.  Will admit to [facility] for nephrology consultation in a.m. for continued peritoneal dialysis while in hospital.  Chronic combined systolic and diastolic CHF: Appears euvolemic.  She remains on Lasix while also on peritoneal dialysis. -Continue Coreg, Lasix, lisinopril, spironolactone  Insulin-dependent type 2 diabetes: -Continue very sensitive SSI while in hospital  Hypertension: -Continue Coreg, hydralazine, Imdur, lisinopril, spironolactone, and Lasix  Anemia of chronic disease: Currently stable without obvious signs of bleeding.  Continue monitor.  Hyperlipidemia: Continue atorvastatin.   DVT prophylaxis: Subcutaneous heparin Code Status: Full code, confirmed with patient Family Communication: Discussed with patient, she will discuss with her family Disposition  Plan: Admit to [facility] called: Gastroenterology Admission status: Observation   Zada Finders MD [facility]  If 7PM-7AM, please contact night-coverage [email or web address]  10/10/2019, 8:33 PM

## 2019-10-10 NOTE — ED Notes (Signed)
Patricia [name], daughter of patient would like an update on her mother, [phone].

## 2019-10-10 NOTE — ED Triage Notes (Signed)
Pt c/o LLQ abd pain. Pt states she has had nausea, but denies emesis or diarrhea. Pt denies any fever, cough, SHOB.  Pt has hx of hernias and is a dialysis pt.

## 2019-10-11 ENCOUNTER — Observation Stay (HOSPITAL_COMMUNITY): Payer: Medicare Other

## 2019-10-11 DIAGNOSIS — Z885 Allergy status to narcotic agent status: Secondary | ICD-10-CM | POA: Diagnosis not present

## 2019-10-11 DIAGNOSIS — N186 End stage renal disease: Secondary | ICD-10-CM | POA: Diagnosis not present

## 2019-10-11 DIAGNOSIS — Z992 Dependence on renal dialysis: Secondary | ICD-10-CM | POA: Diagnosis not present

## 2019-10-11 DIAGNOSIS — E663 Overweight: Secondary | ICD-10-CM | POA: Diagnosis present

## 2019-10-11 DIAGNOSIS — I509 Heart failure, unspecified: Secondary | ICD-10-CM | POA: Diagnosis not present

## 2019-10-11 DIAGNOSIS — I152 Hypertension secondary to endocrine disorders: Secondary | ICD-10-CM | POA: Diagnosis present

## 2019-10-11 DIAGNOSIS — K429 Umbilical hernia without obstruction or gangrene: Secondary | ICD-10-CM | POA: Diagnosis present

## 2019-10-11 DIAGNOSIS — E1122 Type 2 diabetes mellitus with diabetic chronic kidney disease: Secondary | ICD-10-CM | POA: Diagnosis not present

## 2019-10-11 DIAGNOSIS — E118 Type 2 diabetes mellitus with unspecified complications: Secondary | ICD-10-CM

## 2019-10-11 DIAGNOSIS — I10 Essential (primary) hypertension: Secondary | ICD-10-CM | POA: Diagnosis not present

## 2019-10-11 DIAGNOSIS — K5792 Diverticulitis of intestine, part unspecified, without perforation or abscess without bleeding: Secondary | ICD-10-CM | POA: Diagnosis not present

## 2019-10-11 DIAGNOSIS — Z800 Family history of malignant neoplasm of digestive organs: Secondary | ICD-10-CM | POA: Diagnosis not present

## 2019-10-11 DIAGNOSIS — R1032 Left lower quadrant pain: Secondary | ICD-10-CM | POA: Diagnosis not present

## 2019-10-11 DIAGNOSIS — Z841 Family history of disorders of kidney and ureter: Secondary | ICD-10-CM | POA: Diagnosis not present

## 2019-10-11 DIAGNOSIS — Z79899 Other long term (current) drug therapy: Secondary | ICD-10-CM | POA: Diagnosis not present

## 2019-10-11 DIAGNOSIS — R52 Pain, unspecified: Secondary | ICD-10-CM | POA: Diagnosis not present

## 2019-10-11 DIAGNOSIS — R1084 Generalized abdominal pain: Secondary | ICD-10-CM | POA: Diagnosis not present

## 2019-10-11 DIAGNOSIS — Z882 Allergy status to sulfonamides status: Secondary | ICD-10-CM | POA: Diagnosis not present

## 2019-10-11 DIAGNOSIS — Z833 Family history of diabetes mellitus: Secondary | ICD-10-CM | POA: Diagnosis not present

## 2019-10-11 DIAGNOSIS — I5042 Chronic combined systolic (congestive) and diastolic (congestive) heart failure: Secondary | ICD-10-CM | POA: Diagnosis not present

## 2019-10-11 DIAGNOSIS — E1169 Type 2 diabetes mellitus with other specified complication: Secondary | ICD-10-CM | POA: Diagnosis present

## 2019-10-11 DIAGNOSIS — E876 Hypokalemia: Secondary | ICD-10-CM | POA: Diagnosis present

## 2019-10-11 DIAGNOSIS — Z7982 Long term (current) use of aspirin: Secondary | ICD-10-CM | POA: Diagnosis not present

## 2019-10-11 DIAGNOSIS — E1159 Type 2 diabetes mellitus with other circulatory complications: Secondary | ICD-10-CM

## 2019-10-11 DIAGNOSIS — K5732 Diverticulitis of large intestine without perforation or abscess without bleeding: Secondary | ICD-10-CM | POA: Diagnosis not present

## 2019-10-11 DIAGNOSIS — E8770 Fluid overload, unspecified: Secondary | ICD-10-CM | POA: Diagnosis not present

## 2019-10-11 DIAGNOSIS — E871 Hypo-osmolality and hyponatremia: Secondary | ICD-10-CM | POA: Diagnosis present

## 2019-10-11 DIAGNOSIS — Z794 Long term (current) use of insulin: Secondary | ICD-10-CM | POA: Diagnosis not present

## 2019-10-11 DIAGNOSIS — Z20822 Contact with and (suspected) exposure to covid-19: Secondary | ICD-10-CM | POA: Diagnosis present

## 2019-10-11 DIAGNOSIS — I132 Hypertensive heart and chronic kidney disease with heart failure and with stage 5 chronic kidney disease, or end stage renal disease: Secondary | ICD-10-CM | POA: Diagnosis not present

## 2019-10-11 DIAGNOSIS — Z6829 Body mass index (BMI) 29.0-29.9, adult: Secondary | ICD-10-CM | POA: Diagnosis not present

## 2019-10-11 DIAGNOSIS — E785 Hyperlipidemia, unspecified: Secondary | ICD-10-CM | POA: Diagnosis present

## 2019-10-11 DIAGNOSIS — E1165 Type 2 diabetes mellitus with hyperglycemia: Secondary | ICD-10-CM | POA: Diagnosis not present

## 2019-10-11 DIAGNOSIS — D631 Anemia in chronic kidney disease: Secondary | ICD-10-CM | POA: Diagnosis not present

## 2019-10-11 LAB — CBG MONITORING, ED: Glucose-Capillary: 143 mg/dL — ABNORMAL HIGH (ref 70–99)

## 2019-10-11 LAB — CBC
HCT: 26.8 % — ABNORMAL LOW (ref 36.0–46.0)
Hemoglobin: 8.5 g/dL — ABNORMAL LOW (ref 12.0–15.0)
MCH: 30.8 pg (ref 26.0–34.0)
MCHC: 31.7 g/dL (ref 30.0–36.0)
MCV: 97.1 fL (ref 80.0–100.0)
Platelets: 203 10*3/uL (ref 150–400)
RBC: 2.76 MIL/uL — ABNORMAL LOW (ref 3.87–5.11)
RDW: 14.5 % (ref 11.5–15.5)
WBC: 11.8 10*3/uL — ABNORMAL HIGH (ref 4.0–10.5)
nRBC: 0 % (ref 0.0–0.2)

## 2019-10-11 LAB — BASIC METABOLIC PANEL
Anion gap: 13 (ref 5–15)
BUN: 43 mg/dL — ABNORMAL HIGH (ref 6–20)
CO2: 25 mmol/L (ref 22–32)
Calcium: 8.2 mg/dL — ABNORMAL LOW (ref 8.9–10.3)
Chloride: 99 mmol/L (ref 98–111)
Creatinine, Ser: 8.39 mg/dL — ABNORMAL HIGH (ref 0.44–1.00)
GFR calc Af Amer: 5 mL/min — ABNORMAL LOW (ref >60)
GFR calc non Af Amer: 5 mL/min — ABNORMAL LOW (ref >60)
Glucose, Bld: 169 mg/dL — ABNORMAL HIGH (ref 70–99)
Potassium: 2.8 mmol/L — ABNORMAL LOW (ref 3.5–5.1)
Sodium: 137 mmol/L (ref 135–145)

## 2019-10-11 LAB — GLUCOSE, CAPILLARY
Glucose-Capillary: 143 mg/dL — ABNORMAL HIGH (ref 70–99)
Glucose-Capillary: 178 mg/dL — ABNORMAL HIGH (ref 70–99)
Glucose-Capillary: 210 mg/dL — ABNORMAL HIGH (ref 70–99)

## 2019-10-11 LAB — SARS CORONAVIRUS 2 (TAT 6-24 HRS): SARS Coronavirus 2: NEGATIVE

## 2019-10-11 MED ORDER — DARBEPOETIN ALFA 100 MCG/0.5ML IJ SOSY
100.0000 ug | PREFILLED_SYRINGE | INTRAMUSCULAR | Status: DC
  Filled 2019-10-11: qty 0.5

## 2019-10-11 MED ORDER — POTASSIUM CHLORIDE CRYS ER 20 MEQ PO TBCR
40.0000 meq | EXTENDED_RELEASE_TABLET | Freq: Once | ORAL | Status: AC
  Administered 2019-10-11: 40 meq via ORAL
  Filled 2019-10-11: qty 2

## 2019-10-11 MED ORDER — GENTAMICIN SULFATE 0.1 % EX CREA
1.0000 "application " | TOPICAL_CREAM | Freq: Every day | CUTANEOUS | Status: DC
  Administered 2019-10-12 – 2019-10-13 (×3): 1 via TOPICAL
  Filled 2019-10-11: qty 15

## 2019-10-11 MED ORDER — HEPARIN 1000 UNIT/ML FOR PERITONEAL DIALYSIS
INTRAPERITONEAL | Status: DC | PRN
  Filled 2019-10-11: qty 5000

## 2019-10-11 MED ORDER — HEPARIN 1000 UNIT/ML FOR PERITONEAL DIALYSIS: 500.0000 [IU] | INTRAMUSCULAR | Status: DC | PRN

## 2019-10-11 MED ORDER — DELFLEX-LC/1.5% DEXTROSE 344 MOSM/L IP SOLN: INTRAPERITONEAL | Status: DC

## 2019-10-11 MED ORDER — DELFLEX-LC/1.5% DEXTROSE 344 MOSM/L IP SOLN
INTRAPERITONEAL | Status: DC
  Administered 2019-10-12: 5000 mL via INTRAPERITONEAL

## 2019-10-11 NOTE — ED Notes (Signed)
Patient is resting comfortably. 

## 2019-10-11 NOTE — Consult Note (Addendum)
Referring Provider:  [facility] Primary Care Physician:  Ladell Pier, MD Primary Gastroenterologist:  Dr. [name]  Reason for Consultation:  Diverticulitis  HPI: Patricia [name] is a 63 y.o. female with medical history significant for ESRD on peritoneal dialysis, chronic combined systolic and diastolic CHF (EF 22-97% by echocardiogram on 09/06/2019), insulin-dependent type 2 diabetes, hypertension, anemia of chronic disease, and recent laparoscopic repair of incarcerated umbilical hernia on June 16, 2019 who presents to the ED for evaluation of abdominal pain.  She tells me that she has been having left lower quadrant abdominal pain that radiates across her lower abdomen for about the past 2 weeks, but has progressively worsened.  Has had associated nausea, but no vomiting.  No bloody stools, although her stools have been dark since being placed on iron supplements.  CT scan of the abdomen and pelvis without contrast showed the following:  IMPRESSION: Continued presence of proximal sigmoid diverticulitis. No definite abscess is noted at this time.  Continued presence of pigtail drainage catheter seen in the anterior portion of the pelvis. No fluid collection is noted around its distal portion, but fluid is noted around this portion before it enters the peritoneal space which was present on prior exam.  Continued presence of moderate size periumbilical hernia which contains loops of small bowel, but does not result in incarceration or obstruction.  She has been placed on IV rocephin and flagyl.   06/03/2017 colonoscopy for iron deficiency anemia with decreased sphincter tone, diverticulosis in the left colon and otherwise normal.  Repeat recommended in 10 years for screening.  EGD at the same timeshowed gastritis and was otherwise normal.  It was thought most likely patient's IDA was due to chronic kidney disease.   Past Medical History:  Diagnosis Date  . Allergy   . Anemia   .  Anxiety   . Blood transfusion without reported diagnosis   . Cardiomyopathy ([place]) 11/2016   no ischemic eval due to CKD  . CHF (congestive heart failure) ([place])   . CKD (chronic kidney disease), stage IV ([place])   . Depression   . Diabetes mellitus without complication ([place])    type 2  . Dyspnea   . Hypertension   . Obesity     Past Surgical History:  Procedure Laterality Date  . ABDOMINAL HYSTERECTOMY    . BREAST SURGERY     reduction  . [place]  . LAPAROSCOPY N/A 06/16/2019   Procedure: primary closure of umbilical hernia;  Surgeon: Ralene Ok, MD;  Location: [facility];  Service: General;  Laterality: N/A;  . LIPOMA EXCISION     back  Dr. Excell Seltzer 03-22-18  . LIPOMA EXCISION N/A 03/22/2018   Procedure: EXCISION OF BACK LIPOMA;  Surgeon: Excell Seltzer, MD;  Location: [facility];  Service: General;  Laterality: N/A;  . REDUCTION MAMMAPLASTY Bilateral     Prior to Admission medications   Medication Sig Start Date End Date Taking? Authorizing Provider  acetaminophen (TYLENOL) 325 MG tablet Take 2 tablets (650 mg total) by mouth every 6 (six) hours as needed. Patient taking differently: Take 650 mg by mouth every 6 (six) hours as needed for headache (pain).  05/25/19  Yes Varney Biles, MD  albuterol (PROVENTIL HFA;VENTOLIN HFA) 108 (90 Base) MCG/ACT inhaler Inhale 1-2 puffs into the lungs every 6 (six) hours as needed for wheezing or shortness of breath. 10/11/18  Yes Marney Setting, NP  aspirin EC 81 MG tablet Take 1 tablet (81 mg  total) by mouth daily. 01/04/17  Yes Langeland, Dawn T, MD  atorvastatin (LIPITOR) 20 MG tablet Take 1/2 (one-half) tablet by mouth once daily Patient taking differently: Take 10 mg by mouth daily.  06/12/19  Yes Ladell Pier, MD  Blood Glucose Monitoring Suppl (ACCU-CHEK GUIDE) w/Device KIT 1 each by Does not apply route 3 (three) times daily. Use tid as directed 10/21/18  Yes Ladell Pier, MD  calcitRIOL (ROCALTROL)  0.25 MCG capsule Take 0.25 mcg by mouth daily. 09/12/19  Yes [provider]  carvedilol (COREG) 25 MG tablet TAKE 1 TABLET BY MOUTH TWICE DAILY WITH A MEAL Patient taking differently: Take 25 mg by mouth 2 (two) times daily with a meal.  05/04/19  Yes Croitoru, Mihai, MD  dicyclomine (BENTYL) 10 MG capsule Take 1 capsule (10 mg total) by mouth every 6 (six) hours as needed for spasms. 06/16/19  Yes Levin Erp, PA  furosemide (LASIX) 80 MG tablet Take 1 tablet (80 mg total) by mouth 2 (two) times daily. 12/07/18  Yes Elgergawy, Silver Huguenin, MD  gabapentin (NEURONTIN) 100 MG capsule TAKE 1 CAPSULE (100 MG TOTAL) BY MOUTH AT BEDTIME. 12/05/18  Yes Ladell Pier, MD  glipiZIDE (GLUCOTROL) 10 MG tablet Take 10 mg by mouth 2 (two) times daily. 08/15/19  Yes [provider]  glucose 4 GM chewable tablet Chew 1 tablet by mouth once as needed for low blood sugar.    Yes [provider]  glucose blood (ACCU-CHEK GUIDE) test strip Use as instructed tid 10/21/18  Yes Ladell Pier, MD  hydrALAZINE (APRESOLINE) 100 MG tablet Take 1 tablet (100 mg total) by mouth 2 (two) times daily. 06/20/19 10/10/19 Yes [place] Eric J, DO  Insulin Pen Needle (TRUEPLUS PEN NEEDLES) 32G X 4 MM MISC Use as directed to inject insulin. 03/25/18  Yes Ladell Pier, MD  Insulin Pen Needle 31G X 5 MM MISC Use as directed 03/28/18  Yes Ladell Pier, MD  isosorbide mononitrate (IMDUR) 30 MG 24 hr tablet Take 1/2 (one-half) tablet by mouth once daily Patient taking differently: Take 15 mg by mouth daily.  06/12/19  Yes Croitoru, Mihai, MD  lisinopril (ZESTRIL) 40 MG tablet Take 40 mg by mouth daily. 09/19/19  Yes [provider]  ondansetron (ZOFRAN ODT) 4 MG disintegrating tablet Take every 4-6 hours as needed for nausea Patient taking differently: Take 4 mg by mouth every 4 (four) hours as needed for nausea or vomiting.  06/16/19  Yes Lemmon, Lavone Nian, PA  Polyethyl Glycol-Propyl  Glycol (SYSTANE) 0.4-0.3 % SOLN Place 1 drop into both eyes 3 (three) times daily as needed (dry eyes).    Yes [provider]  sevelamer (RENAGEL) 800 MG tablet Take 1,600-3,200 mg by mouth See admin instructions. Take 2-4 tablets (1600-3200 mg) by mouth up to three times daily with meals - 2-3 tablets with small meal, 4 tablets with large meal   Yes [provider]  spironolactone (ALDACTONE) 25 MG tablet Take 25 mg by mouth daily. 09/19/19  Yes [provider]  ACCU-CHEK FASTCLIX LANCETS MISC Uad tid 10/21/18   Ladell Pier, MD  gentamicin cream (GARAMYCIN) 0.1 % Apply 1 application topically See admin instructions. Apply topically to dialysis site after showering - every other day 06/10/19   [provider]  insulin aspart (NOVOLOG) 100 UNIT/ML injection 3 units before meals. Patient not taking: Reported on 10/10/2019 04/06/17   Ladell Pier, MD  Insulin Glargine (LANTUS SOLOSTAR) 100  UNIT/ML Solostar Pen Inject 4 Units into the skin at bedtime. Patient not taking: Reported on 10/10/2019 12/07/18   Elgergawy, Silver Huguenin, MD    Current Facility-Administered Medications  Medication Dose Route Frequency Provider Last Rate Last Admin  . acetaminophen (TYLENOL) tablet 650 mg  650 mg Oral Q6H PRN Lenore Cordia, MD       Or  . acetaminophen (TYLENOL) suppository 650 mg  650 mg Rectal Q6H PRN Lenore Cordia, MD      . aspirin EC tablet 81 mg  81 mg Oral Daily Patel, Vishal R, MD      . atorvastatin (LIPITOR) tablet 10 mg  10 mg Oral Daily Lenore Cordia, MD   10 mg at 10/10/19 2302  . calcitRIOL (ROCALTROL) capsule 0.25 mcg  0.25 mcg Oral Daily Zada Finders R, MD      . carvedilol (COREG) tablet 25 mg  25 mg Oral BID WC Patel, Vishal R, MD      . cefTRIAXone (ROCEPHIN) 2 g in sodium chloride 0.9 % 100 mL IVPB  2 g Intravenous Q24H Zada Finders R, MD       And  . metroNIDAZOLE (FLAGYL) IVPB 500 mg  500 mg Intravenous Q8H Lenore Cordia, MD   Stopped at  10/11/19 [phone]  . furosemide (LASIX) tablet 80 mg  80 mg Oral BID Zada Finders R, MD      . gabapentin (NEURONTIN) capsule 100 mg  100 mg Oral QHS Zada Finders R, MD   100 mg at 10/10/19 2306  . heparin injection 5,000 Units  5,000 Units Subcutaneous Q8H Lenore Cordia, MD   5,000 Units at 10/11/19 0505  . hydrALAZINE (APRESOLINE) tablet 100 mg  100 mg Oral BID Zada Finders R, MD   100 mg at 10/10/19 2302  . HYDROmorphone (DILAUDID) injection 0.5 mg  0.5 mg Intravenous Q4H PRN Zada Finders R, MD      . insulin aspart (novoLOG) injection 0-6 Units  0-6 Units Subcutaneous TID WC Patel, Vishal R, MD      . isosorbide mononitrate (IMDUR) 24 hr tablet 15 mg  15 mg Oral Daily Lenore Cordia, MD   15 mg at 10/10/19 2302  . lisinopril (ZESTRIL) tablet 40 mg  40 mg Oral Daily Lenore Cordia, MD   40 mg at 10/10/19 2302  . ondansetron (ZOFRAN) tablet 4 mg  4 mg Oral Q6H PRN Lenore Cordia, MD       Or  . ondansetron (ZOFRAN) injection 4 mg  4 mg Intravenous Q6H PRN Zada Finders R, MD      . oxyCODONE (Oxy IR/ROXICODONE) immediate release tablet 5 mg  5 mg Oral Q4H PRN Zada Finders R, MD      . potassium chloride SA (KLOR-CON) CR tablet 40 mEq  40 mEq Oral Once Alma Friendly, MD      . spironolactone (ALDACTONE) tablet 25 mg  25 mg Oral Daily Lenore Cordia, MD   25 mg at 10/10/19 2302   Current Outpatient Medications  Medication Sig Dispense Refill  . acetaminophen (TYLENOL) 325 MG tablet Take 2 tablets (650 mg total) by mouth every 6 (six) hours as needed. (Patient taking differently: Take 650 mg by mouth every 6 (six) hours as needed for headache (pain). ) 30 tablet 0  . albuterol (PROVENTIL HFA;VENTOLIN HFA) 108 (90 Base) MCG/ACT inhaler Inhale 1-2 puffs into the lungs every 6 (six) hours as needed for wheezing or shortness of breath. 1 Inhaler  0  . aspirin EC 81 MG tablet Take 1 tablet (81 mg total) by mouth daily. 90 tablet 3  . atorvastatin (LIPITOR) 20 MG tablet Take 1/2 (one-half)  tablet by mouth once daily (Patient taking differently: Take 10 mg by mouth daily. ) 45 tablet 0  . Blood Glucose Monitoring Suppl (ACCU-CHEK GUIDE) w/Device KIT 1 each by Does not apply route 3 (three) times daily. Use tid as directed 1 kit 0  . calcitRIOL (ROCALTROL) 0.25 MCG capsule Take 0.25 mcg by mouth daily.    . carvedilol (COREG) 25 MG tablet TAKE 1 TABLET BY MOUTH TWICE DAILY WITH A MEAL (Patient taking differently: Take 25 mg by mouth 2 (two) times daily with a meal. ) 60 tablet 10  . dicyclomine (BENTYL) 10 MG capsule Take 1 capsule (10 mg total) by mouth every 6 (six) hours as needed for spasms. 30 capsule 2  . furosemide (LASIX) 80 MG tablet Take 1 tablet (80 mg total) by mouth 2 (two) times daily. 60 tablet 0  . gabapentin (NEURONTIN) 100 MG capsule TAKE 1 CAPSULE (100 MG TOTAL) BY MOUTH AT BEDTIME. 90 capsule 3  . glipiZIDE (GLUCOTROL) 10 MG tablet Take 10 mg by mouth 2 (two) times daily.    Marland Kitchen glucose 4 GM chewable tablet Chew 1 tablet by mouth once as needed for low blood sugar.     Marland Kitchen glucose blood (ACCU-CHEK GUIDE) test strip Use as instructed tid 100 each 12  . hydrALAZINE (APRESOLINE) 100 MG tablet Take 1 tablet (100 mg total) by mouth 2 (two) times daily. 180 tablet 0  . Insulin Pen Needle (TRUEPLUS PEN NEEDLES) 32G X 4 MM MISC Use as directed to inject insulin. 100 each 3  . Insulin Pen Needle 31G X 5 MM MISC Use as directed 100 each 12  . isosorbide mononitrate (IMDUR) 30 MG 24 hr tablet Take 1/2 (one-half) tablet by mouth once daily (Patient taking differently: Take 15 mg by mouth daily. ) 15 tablet 6  . lisinopril (ZESTRIL) 40 MG tablet Take 40 mg by mouth daily.    . ondansetron (ZOFRAN ODT) 4 MG disintegrating tablet Take every 4-6 hours as needed for nausea (Patient taking differently: Take 4 mg by mouth every 4 (four) hours as needed for nausea or vomiting. ) 30 tablet 2  . Polyethyl Glycol-Propyl Glycol (SYSTANE) 0.4-0.3 % SOLN Place 1 drop into both eyes 3 (three) times  daily as needed (dry eyes).     . sevelamer (RENAGEL) 800 MG tablet Take 1,600-3,200 mg by mouth See admin instructions. Take 2-4 tablets (1600-3200 mg) by mouth up to three times daily with meals - 2-3 tablets with small meal, 4 tablets with large meal    . spironolactone (ALDACTONE) 25 MG tablet Take 25 mg by mouth daily.    Marland Kitchen ACCU-CHEK FASTCLIX LANCETS MISC Uad tid 100 each 12  . gentamicin cream (GARAMYCIN) 0.1 % Apply 1 application topically See admin instructions. Apply topically to dialysis site after showering - every other day    . insulin aspart (NOVOLOG) 100 UNIT/ML injection 3 units before meals. (Patient not taking: Reported on 10/10/2019) 60 mL 3  . Insulin Glargine (LANTUS SOLOSTAR) 100 UNIT/ML Solostar Pen Inject 4 Units into the skin at bedtime. (Patient not taking: Reported on 10/10/2019) 15 mL 2    Allergies as of 10/10/2019 - Review Complete 10/10/2019  Allergen Reaction Noted  . Codone [hydrocodone] Itching 04/28/2016  . Norvasc [amlodipine besylate] Swelling 11/04/2016  . Hydralazine Other (See Comments)  and Swelling 09/29/2016  . Sulfa antibiotics Itching and Rash 02/08/2013    Family History  Problem Relation Age of Onset  . Diabetes Brother   . Stomach cancer Brother   . Kidney disease Brother   . Heart Problems Maternal Grandmother        pacemaker  . Heart disease Maternal Grandfather   . Rectal cancer Neg Hx   . Esophageal cancer Neg Hx   . Liver cancer Neg Hx   . Colon cancer Neg Hx     Social History   Socioeconomic History  . Marital status: Divorced    Spouse name: Not on file  . Number of children: 2  . Years of education: Not on file  . Highest education level: Not on file  Occupational History  . Occupation: disabled  Tobacco Use  . Smoking status: Never Smoker  . Smokeless tobacco: Never Used  Substance and Sexual Activity  . Alcohol use: No  . Drug use: No  . Sexual activity: Not Currently  Other Topics Concern  . Not on file    Social History Narrative  . Not on file   Social Determinants of Health   Financial Resource Strain:   . Difficulty of Paying Living Expenses: Not on file  Food Insecurity:   . Worried About Charity fundraiser in the Last Year: Not on file  . Ran Out of Food in the Last Year: Not on file  Transportation Needs:   . Lack of Transportation (Medical): Not on file  . Lack of Transportation (Non-Medical): Not on file  Physical Activity:   . Days of Exercise per Week: Not on file  . Minutes of Exercise per Session: Not on file  Stress:   . Feeling of Stress : Not on file  Social Connections:   . Frequency of Communication with Friends and Family: Not on file  . Frequency of Social Gatherings with Friends and Family: Not on file  . Attends Religious Services: Not on file  . Active Member of Clubs or Organizations: Not on file  . Attends Archivist Meetings: Not on file  . Marital Status: Not on file  Intimate Partner Violence:   . Fear of Current or Ex-Partner: Not on file  . Emotionally Abused: Not on file  . Physically Abused: Not on file  . Sexually Abused: Not on file    Review of Systems: ROS is O/W negative except as mentioned in HPI.  Physical Exam: Vital signs in last 24 hours: Temp:  [98.1 F (36.7 C)] 98.1 F (36.7 C) (12/29 1210) Pulse Rate:  [63-77] 77 (12/30 0800) Resp:  [14-19] 16 (12/30 0800) BP: (132-189)/(64-76) 164/65 (12/30 0800) SpO2:  [97 %-100 %] 97 % (12/30 0800)   General:  Alert, Well-developed, well-nourished, pleasant and cooperative in NAD Head:  Normocephalic and atraumatic. Eyes:  Sclera clear, no icterus.  Conjunctiva pink. Ears:  Normal auditory acuity. Mouth:  No deformity or lesions.   Lungs:  Clear throughout to auscultation.  No wheezes, crackles, or rhonchi.  Heart:  Regular rate and rhythm; no murmurs, clicks, rubs, or gallops. Abdomen:  Soft, non-distended.  BS present.  Mild diffuse TTP > in the LLQ. Msk:  Symmetrical  without gross deformities. Pulses:  Normal pulses noted. Extremities:  Without clubbing or edema. Neurologic:  Alert and oriented x 4;  grossly normal neurologically. Skin:  Intact without significant lesions or rashes. Psych:  Alert and cooperative. Normal mood and affect.  Lab Results: Recent  Labs    10/10/19 1418 10/11/19 0457  WBC 10.8* 11.8*  HGB 9.9* 8.5*  HCT 30.7* 26.8*  PLT 212 203   BMET Recent Labs    10/10/19 1418 10/11/19 0457  NA 136 137  K 3.1* 2.8*  CL 96* 99  CO2 27 25  GLUCOSE 198* 169*  BUN 36* 43*  CREATININE 7.54* 8.39*  CALCIUM 8.3* 8.2*   LFT Recent Labs    10/10/19 1418  PROT 6.6  ALBUMIN 2.4*  AST 30  ALT 18  ALKPHOS 55  BILITOT 1.1   Studies/Results: CT ABDOMEN PELVIS WO CONTRAST  Result Date: 10/10/2019 CLINICAL DATA:  Left lower quadrant abdominal pain. EXAM: CT ABDOMEN AND PELVIS WITHOUT CONTRAST TECHNIQUE: Multidetector CT imaging of the abdomen and pelvis was performed following the standard protocol without IV contrast. COMPARISON:  June 16, 2019.  May 25, 2019. FINDINGS: Lower chest: No acute abnormality. Hepatobiliary: No focal liver abnormality is seen. No gallstones, gallbladder wall thickening, or biliary dilatation. Pancreas: Unremarkable. No pancreatic ductal dilatation or surrounding inflammatory changes. Spleen: Normal in size without focal abnormality. Adrenals/Urinary Tract: Adrenal glands are unremarkable. Kidneys are normal, without renal calculi, focal lesion, or hydronephrosis. Bladder is unremarkable. Stomach/Bowel: The stomach is unremarkable. The appendix appears normal. There is no evidence of bowel obstruction. There is continued wall thickening and inflammation involving the proximal sigmoid colon consistent with diverticulitis. Vascular/Lymphatic: Aortic atherosclerosis. No enlarged abdominal or pelvic lymph nodes. Reproductive: Status post hysterectomy. No adnexal masses. Other: There is continued presence of  pelvic drainage catheter in the anterior pelvis. No fluid collection is noted around its distal tip. However there is noted fluid surrounding its portion just before it enters the peritoneal space. Also noted is continued presence of moderate size periumbilical hernia which contains loops of small bowel, but does not result in obstruction or incarceration. Musculoskeletal: No acute or significant osseous findings. IMPRESSION: Continued presence of proximal sigmoid diverticulitis. No definite abscess is noted at this time. Continued presence of pigtail drainage catheter seen in the anterior portion of the pelvis. No fluid collection is noted around its distal portion, but fluid is noted around this portion before it enters the peritoneal space which was present on prior exam. Continued presence of moderate size periumbilical hernia which contains loops of small bowel, but does not result in incarceration or obstruction. Electronically Signed   By: Marijo Conception M.D.   On: 10/10/2019 15:57   DG Abd 1 View  Result Date: 10/11/2019 CLINICAL DATA:  Umbilical hernia. EXAM: ABDOMEN - 1 VIEW COMPARISON:  Abdominal CT yesterday. FINDINGS: Peritoneal dialysis catheter in place. No bowel dilatation to suggest obstruction. Small volume of stool in the colon. No evidence of free air in the supine views. Vascular calcifications. IMPRESSION: No evidence of bowel obstruction. Patient's known umbilical hernia is not well assessed on this supine view. Peritoneal dialysis catheter in place. Electronically Signed   By: Keith Rake M.D.   On: 10/11/2019 06:13   IMPRESSION:  *Sigmoid diverticulitis: Seen on CT scan in August, September, and now. -Continue IV antibiotics with ceftriaxone and Flagyl -Clear liquid diet. -Continue antiemetics and pain control as needed. -Need to consider surgical consult inpatient versus outpatient to discuss elective resection due to frequent recurrence of this diverticulitis recently as  well as this persistent periumbilical hernia.  *Periumbilical hernia:  S/p laparoscopic repair of incarcerated umbilical hernia 05/15/65.  CT shows continued presence of moderate size periumbilical hernia containing loops of small bowel without evidence of incarceration  or obstruction. -Needs surgical follow-up.  *ESRD on peritoneal dialysis  *Hypokalemia:  K+ 2.8 this AM. -Replacement per primary service.  *Normocytic anemia:  Likely due to CKD as determined back in 2018 after GI procedures.  Laban Emperor. Zehr  10/11/2019, 8:59 AM    Attending physician's note   I have taken a history, examined the patient and reviewed the chart. I agree with the Advanced Practitioner's note, impression and recommendations.  63 year old female with end-stage renal disease on peritoneal dialysis, incarcerated umbilical hernia s/p repair September 2020 admitted with acute sigmoid diverticulitis with no abscess or microperforation She has mild left lower quadrant discomfort, continue IV antibiotics while inpatient, can transition to oral antibiotics on discharge to complete 10-day course  Consult surgery, can be done as outpatient to evaluate for elective segmental colon resection given history of recurrent diverticulitis.  We will sign off, available if have any further questions or concerns K. Denzil Magnuson , MD [phone]

## 2019-10-11 NOTE — ED Notes (Signed)
Patient is resting comfortably. Will do vital signs with blood draw.

## 2019-10-11 NOTE — Progress Notes (Signed)
PROGRESS NOTE  Patricia [name]:[id number] DOB: Nov 03, 1955 DOA: 10/10/2019 PCP: Ladell Pier, MD  HPI/Recap of past 24 hours: HPI from Dr Sallye Lat Patricia [name] is a 63 y.o. female with medical history significant for ESRD on peritoneal dialysis, chronic combined systolic and diastolic CHF (EF 25-36% by echocardiogram on 09/06/2019), insulin-dependent type 2 diabetes, hypertension, anemia of chronic disease, and recent laparoscopic repair of incarcerated umbilical hernia on June 16, 2019 who presents to the ED for evaluation of 2 weeks of left lower quadrant abdominal pain that has been progressively worsening.  She has had poor oral intake with nausea but has not had any emesis. Pt called her GI office who recommended she come to the ED for further evaluation. CT abdomen/pelvis without contrast showed proximal sigmoid diverticulitis without definite abscess. EDP discussed the case with on-call GI who recommended admission for IV antibiotics and liquid diet.  Patient was given IV ceftriaxone and Flagyl and the hospitalist service was consulted admit for further evaluation and management.     Today, patient feels about the same, complaining of gas and still some left lower quadrant abdominal discomfort.  Denies any nausea/vomiting, fever/chills, chest pain, shortness of breath, diarrhea.    Assessment/Plan: Principal Problem:   Sigmoid diverticulitis Active Problems:   Hypertension associated with diabetes ([place])   Diabetes mellitus type 2 with complications ([place])   CHF (congestive heart failure) (H[facility])   Periumbilical hernia   ESRD on peritoneal dialysis ([place])   Sigmoid diverticulitis Currently afebrile, with leukocytosis CT abdomen/pelvis showed proximal sigmoid diverticulitis without definite abscess GI consulted, continue IV antibiotics, may transition to oral and completed 10-day course. Recommend outpatient surgery consult for elective segmental colon resection given history  of recurrent diverticulitis.  GI signed off Continue IV ceftriaxone, Flagyl Advance diet as tolerated  Periumbilical hernia Status post laparoscopic repair of incarcerated umbilical hernia 03/15/4033 CT shows continued presence of moderate-sized periumbilical hernia containing loops of small bowel without evidence of incarceration or obstruction  ESRD on PD Further management per nephrology  Hypokalemia Replace as needed  Anemia of chronic kidney disease Hemoglobin around baseline Daily CBC  Chronic combined systolic and diastolic HF Appears euvolemic Continue Coreg, Imdur, Lasix, lisinopril, spironolactone  Diabetes mellitus type 2 SSI, Accu-Cheks, hypoglycemic protocol  Hypertension Continue Coreg, hydralazine, Imdur, lisinopril, spironolactone  Hyperlipidemia Continue Lipitor          Malnutrition Type:      Malnutrition Characteristics:      Nutrition Interventions:       Estimated body mass index is 29.05 kg/m as calculated from the following:   Height as of 07/06/19: 5\' 3"  (1.6 m).   Weight as of 07/06/19: 74.4 kg.     Code Status: Full  Family Communication: None at bedside  Disposition Plan: Likely home once stable   Consultants:  GI  Nephrology  Procedures:  None  Antimicrobials:  Ceftriaxone  Flagyl  DVT prophylaxis: Heparin   Objective: Vitals:   10/11/19 0629 10/11/19 0800 10/11/19 1000 10/11/19 1156  BP: (!) 164/65 (!) 164/65 135/64 (!) 154/66  Pulse: 71 77 72 78  Resp: 16 16 16    Temp:    98.5 F (36.9 C)  TempSrc:    Oral  SpO2: 97% 97% 98% 100%    Intake/Output Summary (Last 24 hours) at 10/11/2019 1657 Last data filed at 10/11/2019 1300 Gross per 24 hour  Intake 300 ml  Output --  Net 300 ml   There were no vitals filed for this  visit.  Exam:  General: NAD   Cardiovascular: S1, S2 present  Respiratory: CTAB  Abdomen: Soft, LLQ tenderness, nondistended, bowel sounds present, PD catheter in  place on right side of abdomen  Musculoskeletal: No bilateral pedal edema noted  Skin: Normal  Psychiatry: Normal mood   Data Reviewed: CBC: Recent Labs  Lab 10/10/19 1418 10/11/19 0457  WBC 10.8* 11.8*  NEUTROABS 8.3*  --   HGB 9.9* 8.5*  HCT 30.7* 26.8*  MCV 98.1 97.1  PLT 212 629   Basic Metabolic Panel: Recent Labs  Lab 10/10/19 1418 10/10/19 1505 10/11/19 0457  NA 136  --  137  K 3.1*  --  2.8*  CL 96*  --  99  CO2 27  --  25  GLUCOSE 198*  --  169*  BUN 36*  --  43*  CREATININE 7.54*  --  8.39*  CALCIUM 8.3*  --  8.2*  MG  --  1.7  --    GFR: CrCl cannot be calculated (Unknown ideal weight.). Liver Function Tests: Recent Labs  Lab 10/10/19 1418  AST 30  ALT 18  ALKPHOS 55  BILITOT 1.1  PROT 6.6  ALBUMIN 2.4*   Recent Labs  Lab 10/10/19 1418  LIPASE 33   No results for input(s): AMMONIA in the last 168 hours. Coagulation Profile: No results for input(s): INR, PROTIME in the last 168 hours. Cardiac Enzymes: No results for input(s): CKTOTAL, CKMB, CKMBINDEX, TROPONINI in the last 168 hours. BNP (last 3 results) No results for input(s): PROBNP in the last 8760 hours. HbA1C: No results for input(s): HGBA1C in the last 72 hours. CBG: Recent Labs  Lab 10/10/19 2212 10/10/19 2213 10/11/19 0745 10/11/19 1205  GLUCAP 130* 136* 143* 143*   Lipid Profile: No results for input(s): CHOL, HDL, LDLCALC, TRIG, CHOLHDL, LDLDIRECT in the last 72 hours. Thyroid Function Tests: No results for input(s): TSH, T4TOTAL, FREET4, T3FREE, THYROIDAB in the last 72 hours. Anemia Panel: No results for input(s): VITAMINB12, FOLATE, FERRITIN, TIBC, IRON, RETICCTPCT in the last 72 hours. Urine analysis:    Component Value Date/Time   COLORURINE YELLOW 10/10/2019 1801   APPEARANCEUR CLEAR 10/10/2019 1801   LABSPEC 1.013 10/10/2019 1801   PHURINE 6.0 10/10/2019 1801   GLUCOSEU 50 (A) 10/10/2019 1801   HGBUR NEGATIVE 10/10/2019 1801   BILIRUBINUR NEGATIVE  10/10/2019 1801   BILIRUBINUR 1.0 10/09/2016 1306   KETONESUR NEGATIVE 10/10/2019 1801   PROTEINUR >=300 (A) 10/10/2019 1801   UROBILINOGEN 0.2 04/24/2018 1529   NITRITE NEGATIVE 10/10/2019 1801   LEUKOCYTESUR NEGATIVE 10/10/2019 1801   Sepsis Labs: @LABRCNTIP (procalcitonin:4,lacticidven:4)  ) Recent Results (from the past 240 hour(s))  SARS CORONAVIRUS 2 (TAT 6-24 HRS) Nasopharyngeal Nasopharyngeal Swab     Status: None   Collection Time: 10/10/19  6:16 PM   Specimen: Nasopharyngeal Swab  Result Value Ref Range Status   SARS Coronavirus 2 NEGATIVE NEGATIVE Final    Comment: (NOTE) SARS-CoV-2 target nucleic acids are NOT DETECTED. The SARS-CoV-2 RNA is generally detectable in upper and lower respiratory specimens during the acute phase of infection. Negative results do not preclude SARS-CoV-2 infection, do not rule out co-infections with other pathogens, and should not be used as the sole basis for treatment or other patient management decisions. Negative results must be combined with clinical observations, patient history, and epidemiological information. The expected result is Negative. Fact Sheet for Patients: SugarRoll.be Fact Sheet for Healthcare Providers: https://www.woods-mathews.com/ This test is not yet approved or cleared by the [place]  FDA and  has been authorized for detection and/or diagnosis of SARS-CoV-2 by FDA under an Emergency Use Authorization (EUA). This EUA will remain  in effect (meaning this test can be used) for the duration of the COVID-19 declaration under Section 56 4(b)(1) of the Act, 21 U.S.C. section 360bbb-3(b)(1), unless the authorization is terminated or revoked sooner. Performed at [facility], [place] [address] [place], [place] [address]       Studies: DG Abd 1 View  Result Date: 10/11/2019 CLINICAL DATA:  Umbilical hernia. EXAM: ABDOMEN - 1 VIEW COMPARISON:  Abdominal CT yesterday.  FINDINGS: Peritoneal dialysis catheter in place. No bowel dilatation to suggest obstruction. Small volume of stool in the colon. No evidence of free air in the supine views. Vascular calcifications. IMPRESSION: No evidence of bowel obstruction. Patient's known umbilical hernia is not well assessed on this supine view. Peritoneal dialysis catheter in place. Electronically Signed   By: Keith Rake M.D.   On: 10/11/2019 06:13    Scheduled Meds: . aspirin EC  81 mg Oral Daily  . atorvastatin  10 mg Oral Daily  . calcitRIOL  0.25 mcg Oral Daily  . carvedilol  25 mg Oral BID WC  . [START ON 10/12/2019] darbepoetin (ARANESP) injection - NON-DIALYSIS  100 mcg Subcutaneous Q Thu-1800  . furosemide  80 mg Oral BID  . gabapentin  100 mg Oral QHS  . gentamicin cream  1 application Topical Daily  . heparin  5,000 Units Subcutaneous Q8H  . hydrALAZINE  100 mg Oral BID  . insulin aspart  0-6 Units Subcutaneous TID WC  . isosorbide mononitrate  15 mg Oral Daily  . lisinopril  40 mg Oral Daily  . spironolactone  25 mg Oral Daily    Continuous Infusions: . cefTRIAXone (ROCEPHIN)  IV     And  . metronidazole 500 mg (10/11/19 1309)  . dialysis solution 1.5% low-MG/low-CA       LOS: 0 days     Alma Friendly, MD [facility]  If 7PM-7AM, please contact night-coverage [email or web address] 10/11/2019, 4:57 PM

## 2019-10-11 NOTE — Plan of Care (Signed)
  Problem: Clinical Measurements: Goal: Respiratory complications will improve Outcome: Progressing Goal: Cardiovascular complication will be avoided Outcome: Progressing   Problem: Activity: Goal: Risk for activity intolerance will decrease Outcome: Progressing   Problem: Coping: Goal: Level of anxiety will decrease Outcome: Progressing   Problem: Safety: Goal: Ability to remain free from injury will improve Outcome: Progressing   Problem: Skin Integrity: Goal: Risk for impaired skin integrity will decrease Outcome: Progressing

## 2019-10-11 NOTE — Consult Note (Signed)
Renal Service Consult Note [name] 10/11/2019 [place] Requesting Physician:  Dr Posey Pronto, V.   Reason for Consult:  ESRD pt w/ diverticulitis HPI: The patient is a 63 y.o. year-old w/ hx of CHF, DM2, HTN, anemia and ESRD on HD. Pt recently has repair of incarc umbilical hernia on 0/3/50 and now presents with abd pain in LLQ for 1-2 wks.  In ED CT showed proximal sigmoid diverticulitis w/o definite abscess or signs of perforation. Pt admitted and started on IV abx. Asked to see for ESRD.    Pt on PD for about 1 year.  Has continues all her PD except for last night.  No cloudy fluid. No exit site drainage or pain, all pain is in the LLQ and cath is in the RLQ.      ROS  denies CP  no joint pain   no HA  no blurry vision  no rash  no diarrhea  no nausea/ vomiting   Past Medical History  Past Medical History:  Diagnosis Date  . Allergy   . Anemia   . Anxiety   . Blood transfusion without reported diagnosis   . Cardiomyopathy ([place]) 11/2016   no ischemic eval due to CKD  . CHF (congestive heart failure) ([place])   . CKD (chronic kidney disease), stage IV ([place])   . Depression   . Diabetes mellitus without complication ([place])    type 2  . Dyspnea   . Hypertension   . Obesity    Past Surgical History  Past Surgical History:  Procedure Laterality Date  . ABDOMINAL HYSTERECTOMY    . BREAST SURGERY     reduction  . [place]  . LAPAROSCOPY N/A 06/16/2019   Procedure: primary closure of umbilical hernia;  Surgeon: Ralene Ok, MD;  Location: [facility];  Service: General;  Laterality: N/A;  . LIPOMA EXCISION     back  Dr. Excell Seltzer 03-22-18  . LIPOMA EXCISION N/A 03/22/2018   Procedure: EXCISION OF BACK LIPOMA;  Surgeon: Excell Seltzer, MD;  Location: [facility];  Service: General;  Laterality: N/A;  . REDUCTION MAMMAPLASTY Bilateral    Family History  Family History  Problem Relation Age of Onset  . Diabetes Brother    . Stomach cancer Brother   . Kidney disease Brother   . Heart Problems Maternal Grandmother        pacemaker  . Heart disease Maternal Grandfather   . Rectal cancer Neg Hx   . Esophageal cancer Neg Hx   . Liver cancer Neg Hx   . Colon cancer Neg Hx    Social History  reports that she has never smoked. She has never used smokeless tobacco. She reports that she does not drink alcohol or use drugs. Allergies  Allergies  Allergen Reactions  . Codone [Hydrocodone] Itching  . Norvasc [Amlodipine Besylate] Swelling    Lower extremity  . Hydralazine Other (See Comments) and Swelling    Leg swelling  . Sulfa Antibiotics Itching and Rash   Home medications Prior to Admission medications   Medication Sig Start Date End Date Taking? Authorizing Provider  acetaminophen (TYLENOL) 325 MG tablet Take 2 tablets (650 mg total) by mouth every 6 (six) hours as needed. Patient taking differently: Take 650 mg by mouth every 6 (six) hours as needed for headache (pain).  05/25/19  Yes Nanavati, Ankit, MD  albuterol (PROVENTIL HFA;VENTOLIN HFA) 108 (90 Base) MCG/ACT inhaler Inhale 1-2 puffs into the  lungs every 6 (six) hours as needed for wheezing or shortness of breath. 10/11/18  Yes Marney Setting, NP  aspirin EC 81 MG tablet Take 1 tablet (81 mg total) by mouth daily. 01/04/17  Yes Langeland, Dawn T, MD  atorvastatin (LIPITOR) 20 MG tablet Take 1/2 (one-half) tablet by mouth once daily Patient taking differently: Take 10 mg by mouth daily.  06/12/19  Yes Ladell Pier, MD  Blood Glucose Monitoring Suppl (ACCU-CHEK GUIDE) w/Device KIT 1 each by Does not apply route 3 (three) times daily. Use tid as directed 10/21/18  Yes Ladell Pier, MD  calcitRIOL (ROCALTROL) 0.25 MCG capsule Take 0.25 mcg by mouth daily. 09/12/19  Yes [provider]  carvedilol (COREG) 25 MG tablet TAKE 1 TABLET BY MOUTH TWICE DAILY WITH A MEAL Patient taking differently: Take 25 mg by mouth 2 (two) times daily  with a meal.  05/04/19  Yes Croitoru, Mihai, MD  dicyclomine (BENTYL) 10 MG capsule Take 1 capsule (10 mg total) by mouth every 6 (six) hours as needed for spasms. 06/16/19  Yes Levin Erp, PA  furosemide (LASIX) 80 MG tablet Take 1 tablet (80 mg total) by mouth 2 (two) times daily. 12/07/18  Yes Elgergawy, Silver Huguenin, MD  gabapentin (NEURONTIN) 100 MG capsule TAKE 1 CAPSULE (100 MG TOTAL) BY MOUTH AT BEDTIME. 12/05/18  Yes Ladell Pier, MD  glipiZIDE (GLUCOTROL) 10 MG tablet Take 10 mg by mouth 2 (two) times daily. 08/15/19  Yes [provider]  glucose 4 GM chewable tablet Chew 1 tablet by mouth once as needed for low blood sugar.    Yes [provider]  glucose blood (ACCU-CHEK GUIDE) test strip Use as instructed tid 10/21/18  Yes Ladell Pier, MD  hydrALAZINE (APRESOLINE) 100 MG tablet Take 1 tablet (100 mg total) by mouth 2 (two) times daily. 06/20/19 10/10/19 Yes [place] Eric J, DO  Insulin Pen Needle (TRUEPLUS PEN NEEDLES) 32G X 4 MM MISC Use as directed to inject insulin. 03/25/18  Yes Ladell Pier, MD  Insulin Pen Needle 31G X 5 MM MISC Use as directed 03/28/18  Yes Ladell Pier, MD  isosorbide mononitrate (IMDUR) 30 MG 24 hr tablet Take 1/2 (one-half) tablet by mouth once daily Patient taking differently: Take 15 mg by mouth daily.  06/12/19  Yes Croitoru, Mihai, MD  lisinopril (ZESTRIL) 40 MG tablet Take 40 mg by mouth daily. 09/19/19  Yes [provider]  ondansetron (ZOFRAN ODT) 4 MG disintegrating tablet Take every 4-6 hours as needed for nausea Patient taking differently: Take 4 mg by mouth every 4 (four) hours as needed for nausea or vomiting.  06/16/19  Yes Lemmon, Lavone Nian, PA  Polyethyl Glycol-Propyl Glycol (SYSTANE) 0.4-0.3 % SOLN Place 1 drop into both eyes 3 (three) times daily as needed (dry eyes).    Yes [provider]  sevelamer (RENAGEL) 800 MG tablet Take 1,600-3,200 mg by mouth See admin instructions. Take 2-4  tablets (1600-3200 mg) by mouth up to three times daily with meals - 2-3 tablets with small meal, 4 tablets with large meal   Yes [provider]  spironolactone (ALDACTONE) 25 MG tablet Take 25 mg by mouth daily. 09/19/19  Yes [provider]  ACCU-CHEK FASTCLIX LANCETS MISC Uad tid 10/21/18   Ladell Pier, MD  gentamicin cream (GARAMYCIN) 0.1 % Apply 1 application topically See admin instructions. Apply topically to dialysis site after showering - every other day 06/10/19   [provider]  insulin aspart (NOVOLOG) 100 UNIT/ML injection 3 units before meals. Patient not taking: Reported on 10/10/2019 04/06/17   Ladell Pier, MD  Insulin Glargine (LANTUS SOLOSTAR) 100 UNIT/ML Solostar Pen Inject 4 Units into the skin at bedtime. Patient not taking: Reported on 10/10/2019 12/07/18   Elgergawy, Silver Huguenin, MD   Liver Function Tests Recent Labs  Lab 10/10/19 1418  AST 30  ALT 18  ALKPHOS 55  BILITOT 1.1  PROT 6.6  ALBUMIN 2.4*   Recent Labs  Lab 10/10/19 1418  LIPASE 33   CBC Recent Labs  Lab 10/10/19 1418 10/11/19 0457  WBC 10.8* 11.8*  NEUTROABS 8.3*  --   HGB 9.9* 8.5*  HCT 30.7* 26.8*  MCV 98.1 97.1  PLT 212 892   Basic Metabolic Panel Recent Labs  Lab 10/10/19 1418 10/11/19 0457  NA 136 137  K 3.1* 2.8*  CL 96* 99  CO2 27 25  GLUCOSE 198* 169*  BUN 36* 43*  CREATININE 7.54* 8.39*  CALCIUM 8.3* 8.2*   Iron/TIBC/Ferritin/ %Sat    Component Value Date/Time   IRON 30 10/25/2018 0836   TIBC 252 10/25/2018 0836   FERRITIN 252 10/25/2018 0836   IRONPCTSAT 12 10/25/2018 0836   IRONPCTSAT 13 10/13/2016 0831    Vitals:   10/11/19 0629 10/11/19 0800 10/11/19 1000 10/11/19 1156  BP: (!) 164/65 (!) 164/65 135/64 (!) 154/66  Pulse: 71 77 72 78  Resp: _0 Temp:    98.5 F (36.9 C)  TempSrc:    Oral  SpO2: 97% 97% 98% 100%    Exam Gen obese,. And pleasant No rash, cyanosis or gangrene Sclera anicteric, throat clear   No jvd or bruits Chest clear bilat to bases no rales RRR no MRG Abd soft tender in LLQ, PD cath in RLQ no exit site issues GU defer MS no joint effusions or deformity Ext no LE or UE edema, no wounds or ulcers  Neuro is alert, Ox 3 , nf    Home meds:  - furosemide 80 bid/ lisinopril 40/ hydralazine 100 bid/ carvedilol 25 bid/ aldactone 25 qd  - glipizide 10 bid/ glargine 4u hs/ novolog 3u ac  - gabapentin 100 hs  - isosorbide mononitrate 15 qd/ atorvastatin 10/ aspirin 81  - sevelamer carb ac tid  - prn's/ vitamins/ supplements   Outpt PD: 5 exchanges, 2400 fill vol, 1.5 hr dwells, edw 68.5kg  -mircera 150 ug on 12/17      Assessment/ Plan: 1. Acute sigmoid diverticulitis - per primary team, on IV abx and clear liquids 2. ESRD - on PD x 1 yr.  Plan PD tonight, min UF.  3. Volume/ BP - BP's appear okay. On mult BP meds at home, can use these here as needed.  4. DM on insulin 5. H/o CHF 6. Anemia ckd - last esa was on 12/17, would be due tomorrow will reorder. Hb is 8.5.      Kelly Splinter  MD 10/11/2019, 4:30 PM

## 2019-10-12 ENCOUNTER — Ambulatory Visit: Payer: Medicare Other | Attending: Internal Medicine | Admitting: Internal Medicine

## 2019-10-12 DIAGNOSIS — E663 Overweight: Secondary | ICD-10-CM

## 2019-10-12 LAB — BASIC METABOLIC PANEL
Anion gap: 12 (ref 5–15)
BUN: 31 mg/dL — ABNORMAL HIGH (ref 8–23)
CO2: 26 mmol/L (ref 22–32)
Calcium: 8.2 mg/dL — ABNORMAL LOW (ref 8.9–10.3)
Chloride: 97 mmol/L — ABNORMAL LOW (ref 98–111)
Creatinine, Ser: 7.24 mg/dL — ABNORMAL HIGH (ref 0.44–1.00)
GFR calc Af Amer: 6 mL/min — ABNORMAL LOW (ref >60)
GFR calc non Af Amer: 6 mL/min — ABNORMAL LOW (ref >60)
Glucose, Bld: 208 mg/dL — ABNORMAL HIGH (ref 70–99)
Potassium: 2.8 mmol/L — ABNORMAL LOW (ref 3.5–5.1)
Sodium: 135 mmol/L (ref 135–145)

## 2019-10-12 LAB — CBC WITH DIFFERENTIAL/PLATELET
Abs Immature Granulocytes: 0.06 10*3/uL (ref 0.00–0.07)
Basophils Absolute: 0 10*3/uL (ref 0.0–0.1)
Basophils Relative: 0 %
Eosinophils Absolute: 0.1 10*3/uL (ref 0.0–0.5)
Eosinophils Relative: 1 %
HCT: 25.9 % — ABNORMAL LOW (ref 36.0–46.0)
Hemoglobin: 8.4 g/dL — ABNORMAL LOW (ref 12.0–15.0)
Immature Granulocytes: 1 %
Lymphocytes Relative: 21 %
Lymphs Abs: 1.9 10*3/uL (ref 0.7–4.0)
MCH: 31.2 pg (ref 26.0–34.0)
MCHC: 32.4 g/dL (ref 30.0–36.0)
MCV: 96.3 fL (ref 80.0–100.0)
Monocytes Absolute: 0.6 10*3/uL (ref 0.1–1.0)
Monocytes Relative: 6 %
Neutro Abs: 6.3 10*3/uL (ref 1.7–7.7)
Neutrophils Relative %: 71 %
Platelets: 206 10*3/uL (ref 150–400)
RBC: 2.69 MIL/uL — ABNORMAL LOW (ref 3.87–5.11)
RDW: 14.3 % (ref 11.5–15.5)
WBC: 9 10*3/uL (ref 4.0–10.5)
nRBC: 0 % (ref 0.0–0.2)

## 2019-10-12 LAB — GLUCOSE, CAPILLARY
Glucose-Capillary: 107 mg/dL — ABNORMAL HIGH (ref 70–99)
Glucose-Capillary: 153 mg/dL — ABNORMAL HIGH (ref 70–99)
Glucose-Capillary: 114 mg/dL — ABNORMAL HIGH (ref 70–99)
Glucose-Capillary: 189 mg/dL — ABNORMAL HIGH (ref 70–99)

## 2019-10-12 MED ORDER — HEPARIN 1000 UNIT/ML FOR PERITONEAL DIALYSIS: 500.0000 [IU] | INTRAMUSCULAR | Status: DC | PRN

## 2019-10-12 MED ORDER — SEVELAMER CARBONATE 800 MG PO TABS
1600.0000 mg | ORAL_TABLET | Freq: Three times a day (TID) | ORAL | Status: DC
  Administered 2019-10-12 – 2019-10-18 (×18): 1600 mg via ORAL
  Filled 2019-10-12 (×18): qty 2

## 2019-10-12 MED ORDER — POTASSIUM CHLORIDE CRYS ER 20 MEQ PO TBCR
20.0000 meq | EXTENDED_RELEASE_TABLET | Freq: Once | ORAL | Status: AC
  Administered 2019-10-12: 20 meq via ORAL
  Filled 2019-10-12: qty 1

## 2019-10-12 NOTE — Progress Notes (Addendum)
PROGRESS NOTE  Patricia [name] KNL:[id number] DOB: 01-30-1956 DOA: 10/10/2019 PCP: Ladell Pier, MD  HPI/Recap of past 6 hours: 63 year old female with past medical history of ESRD on peritoneal dialysis, chronic combined systolic/diastolic CHF and diabetes type 2 on insulin came to the emergency room on 12/29 with several days of left lower quadrant abdominal pain and admitted for sigmoid diverticulitis.  Patient has been put on clear liquids and IV antibiotics.  No signs of abscess noted.  Patient seems to be feeling a little bit better, still some left lower quadrant abdominal pain.  Nephrology has been consulted and patient continues to get peritoneal dialysis without incident.  Assessment/Plan: Principal Problem:   Sigmoid diverticulitis: Appears to be improving.  Cont IV abx.  We will try advancing to full liquids in the morning Active Problems:   Hypertension associated with diabetes [facility]): Blood pressure slightly trending upward with pain   Diabetes mellitus type 2 with complications [facility]): CBG's stable on sliding scale    Chronic combined systolic and diastolic CHF (congestive heart failure) ([place]): Stable.  Volume is managed by dialysis.  On Coreg.  Ejection fraction slightly decreased to 40-45% plus evidence of diastolic dysfunction seen by echo in 02/20  Overweight: Patient meets criteria for BMI greater than 25    Periumbilical hernia: Stable.    ESRD on peritoneal dialysis [facility]): Getting peritoneal dialysis without incident, appreciate nephrology assistance   Code Status: Full code  Family Communication: Updated daughters by phone  Disposition Plan: Home hopefully in the next few days once able to tolerate solid food   Consultants:  Nephrology  GI  Procedures:  None  Antimicrobials:  IV Rocephin and Flagyl 12/30-present  DVT prophylaxis: Subcu heparin   Objective: Vitals:   10/12/19 0458 10/12/19 0718  BP: (!) 122/51 (!) 155/61  Pulse: 73 78   Resp: 16 16  Temp: 98.8 F (37.1 C) 98.8 F (37.1 C)  SpO2: 98% 100%    Intake/Output Summary (Last 24 hours) at 10/12/2019 1307 Last data filed at 10/12/2019 0124 Gross per 24 hour  Intake 520 ml  Output --  Net 520 ml   Filed Weights   10/11/19 1740 10/12/19 0500 10/12/19 0718  Weight: 72.6 kg 72.1 kg 70.7 kg   Body mass index is 27.61 kg/m.  Exam:   General: Alert and oriented x3, no acute distress  Cardiovascular: Regular rate and rhythm, S1-S2  Respiratory: Clear to auscultation bilaterally  Abdomen: Soft, mild tenderness left lower quadrant, nondistended, hypoactive bowel sounds  Musculoskeletal: No clubbing or cyanosis or edema  Skin: No skin breaks, tears or lesions  Psychiatry: Appropriate, no evidence of psychoses   Data Reviewed: CBC: Recent Labs  Lab 10/10/19 1418 10/11/19 0457 10/12/19 0405  WBC 10.8* 11.8* 9.0  NEUTROABS 8.3*  --  6.3  HGB 9.9* 8.5* 8.4*  HCT 30.7* 26.8* 25.9*  MCV 98.1 97.1 96.3  PLT 212 203 790   Basic Metabolic Panel: Recent Labs  Lab 10/10/19 1418 10/10/19 1505 10/11/19 0457 10/12/19 0405  NA 136  --  137 135  K 3.1*  --  2.8* 2.8*  CL 96*  --  99 97*  CO2 27  --  25 26  GLUCOSE 198*  --  169* 208*  BUN 36*  --  43* 31*  CREATININE 7.54*  --  8.39* 7.24*  CALCIUM 8.3*  --  8.2* 8.2*  MG  --  1.7  --   --    GFR: Estimated Creatinine Clearance:  7.7 mL/min (A) (by C-G formula based on SCr of 7.24 mg/dL (H)). Liver Function Tests: Recent Labs  Lab 10/10/19 1418  AST 30  ALT 18  ALKPHOS 55  BILITOT 1.1  PROT 6.6  ALBUMIN 2.4*   Recent Labs  Lab 10/10/19 1418  LIPASE 33   No results for input(s): AMMONIA in the last 168 hours. Coagulation Profile: No results for input(s): INR, PROTIME in the last 168 hours. Cardiac Enzymes: No results for input(s): CKTOTAL, CKMB, CKMBINDEX, TROPONINI in the last 168 hours. BNP (last 3 results) No results for input(s): PROBNP in the last 8760  hours. HbA1C: No results for input(s): HGBA1C in the last 72 hours. CBG: Recent Labs  Lab 10/11/19 1205 10/11/19 1706 10/11/19 2151 10/12/19 0743 10/12/19 1144  GLUCAP 143* 178* 210* 107* 153*   Lipid Profile: No results for input(s): CHOL, HDL, LDLCALC, TRIG, CHOLHDL, LDLDIRECT in the last 72 hours. Thyroid Function Tests: No results for input(s): TSH, T4TOTAL, FREET4, T3FREE, THYROIDAB in the last 72 hours. Anemia Panel: No results for input(s): VITAMINB12, FOLATE, FERRITIN, TIBC, IRON, RETICCTPCT in the last 72 hours. Urine analysis:    Component Value Date/Time   COLORURINE YELLOW 10/10/2019 1801   APPEARANCEUR CLEAR 10/10/2019 1801   LABSPEC 1.013 10/10/2019 1801   PHURINE 6.0 10/10/2019 1801   GLUCOSEU 50 (A) 10/10/2019 1801   HGBUR NEGATIVE 10/10/2019 1801   BILIRUBINUR NEGATIVE 10/10/2019 1801   BILIRUBINUR 1.0 10/09/2016 1306   KETONESUR NEGATIVE 10/10/2019 1801   PROTEINUR >=300 (A) 10/10/2019 1801   UROBILINOGEN 0.2 04/24/2018 1529   NITRITE NEGATIVE 10/10/2019 1801   LEUKOCYTESUR NEGATIVE 10/10/2019 1801   Sepsis Labs: @LABRCNTIP (procalcitonin:4,lacticidven:4)  ) Recent Results (from the past 240 hour(s))  SARS CORONAVIRUS 2 (TAT 6-24 HRS) Nasopharyngeal Nasopharyngeal Swab     Status: None   Collection Time: 10/10/19  6:16 PM   Specimen: Nasopharyngeal Swab  Result Value Ref Range Status   SARS Coronavirus 2 NEGATIVE NEGATIVE Final    Comment: (NOTE) SARS-CoV-2 target nucleic acids are NOT DETECTED. The SARS-CoV-2 RNA is generally detectable in upper and lower respiratory specimens during the acute phase of infection. Negative results do not preclude SARS-CoV-2 infection, do not rule out co-infections with other pathogens, and should not be used as the sole basis for treatment or other patient management decisions. Negative results must be combined with clinical observations, patient history, and epidemiological information. The expected result is  Negative. Fact Sheet for Patients: SugarRoll.be Fact Sheet for Healthcare Providers: https://www.woods-mathews.com/ This test is not yet approved or cleared by the [place] FDA and  has been authorized for detection and/or diagnosis of SARS-CoV-2 by FDA under an Emergency Use Authorization (EUA). This EUA will remain  in effect (meaning this test can be used) for the duration of the COVID-19 declaration under Section 56 4(b)(1) of the Act, 21 U.S.C. section 360bbb-3(b)(1), unless the authorization is terminated or revoked sooner. Performed at [facility], [place] [address] [place], [place] [address]       Studies: No results found.  Scheduled Meds: . aspirin EC  81 mg Oral Daily  . atorvastatin  10 mg Oral Daily  . calcitRIOL  0.25 mcg Oral Daily  . carvedilol  25 mg Oral BID WC  . darbepoetin (ARANESP) injection - NON-DIALYSIS  100 mcg Subcutaneous Q Thu-1800  . furosemide  80 mg Oral BID  . gabapentin  100 mg Oral QHS  . gentamicin cream  1 application Topical Daily  . heparin  5,000 Units Subcutaneous Q8H  . hydrALAZINE  100 mg Oral BID  . insulin aspart  0-6 Units Subcutaneous TID WC  . isosorbide mononitrate  15 mg Oral Daily  . lisinopril  40 mg Oral Daily  . sevelamer carbonate  1,600 mg Oral TID WC  . spironolactone  25 mg Oral Daily    Continuous Infusions: . cefTRIAXone (ROCEPHIN)  IV 2 g (10/11/19 1714)   And  . metronidazole 500 mg (10/12/19 1201)  . dialysis solution 1.5% low-MG/low-CA       LOS: 1 day     Annita Brod, MD [facility]  To reach me or the doctor on call, go to: [email or web address] Password [email or web address]  10/12/2019, 1:07 PM

## 2019-10-12 NOTE — Progress Notes (Signed)
Subjective:  Tolerating PD , and tolerating clear liquids   Objective Vital signs in last 24 hours: Vitals:   10/11/19 2155 10/12/19 0458 10/12/19 0500 10/12/19 0718  BP: 117/61 (!) 122/51  (!) 155/61  Pulse: 77 73  78  Resp: 16 16    Temp: 98.9 F (37.2 C) 98.8 F (37.1 C)  98.8 F (37.1 C)  TempSrc: Oral Oral  Oral  SpO2: 97% 98%  100%  Weight:   72.1 kg 70.7 kg   Weight change:   Physical Exam: General: alert /pleasant  Female NAD Heart: RRR , No m,r,g Lungs: CTA  Abdomen: BS pos, soft  PD cath R lower quad nontender no dc at site, Non distended  Extremities: no pedal edema   Dialysis Access: pd Cath    Home meds:  - furosemide 80 bid/ lisinopril 40/ hydralazine 100 bid/ carvedilol 25 bid/ aldactone 25 qd  - glipizide 10 bid/ glargine 4u hs/ novolog 3u ac  - gabapentin 100 hs  - isosorbide mononitrate 15 qd/ atorvastatin 10/ aspirin 81  - sevelamer carb 1600mg   ac tid  - prn's/ vitamins/ supplements   Outpt PD: 5 exchanges, 2400 fill vol, 1.5 hr dwells, edw 68.5kg  -mircera 150 ug on 12/17  Problem/Plan:  1. Acute sigmoid diverticulitis - per primary team, on IV abx and clear liquids tolerating /Noted GI saw and sign off reccommended surg see as op  2. ESRD - on PD x 1 yr.  PD tolerated  Pd last night  min UF. Berline Lopes Renvela tomorrow  3. Volume/ BP - BP's appear okay. On mult BP meds at home, can use these here as needed. / states using Furosemide only prn at home will dc here ( PD uf as needed ) 4. DM on insulin 5. H/o CHF 6. Anemia ckd - last esa was on 12/17, would be due tomorrow will reorder. Hb is 8.5>8.4 today .     Ernest Haber, PA-C [facility] [phone] 10/12/2019,9:22 AM  LOS: 1 day   Labs: Basic Metabolic Panel: Recent Labs  Lab 10/10/19 1418 10/11/19 0457 10/12/19 0405  NA 136 137 135  K 3.1* 2.8* 2.8*  CL 96* 99 97*  CO2 27 25 26   GLUCOSE 198* 169* 208*  BUN 36* 43* 31*  CREATININE 7.54* 8.39* 7.24*  CALCIUM  8.3* 8.2* 8.2*   Liver Function Tests: Recent Labs  Lab 10/10/19 1418  AST 30  ALT 18  ALKPHOS 55  BILITOT 1.1  PROT 6.6  ALBUMIN 2.4*   Recent Labs  Lab 10/10/19 1418  LIPASE 33   No results for input(s): AMMONIA in the last 168 hours. CBC: Recent Labs  Lab 10/10/19 1418 10/11/19 0457 10/12/19 0405  WBC 10.8* 11.8* 9.0  NEUTROABS 8.3*  --  6.3  HGB 9.9* 8.5* 8.4*  HCT 30.7* 26.8* 25.9*  MCV 98.1 97.1 96.3  PLT 212 203 206   Cardiac Enzymes: No results for input(s): CKTOTAL, CKMB, CKMBINDEX, TROPONINI in the last 168 hours. CBG: Recent Labs  Lab 10/11/19 0745 10/11/19 1205 10/11/19 1706 10/11/19 2151 10/12/19 0743  GLUCAP 143* 143* 178* 210* 107*    Medications: . cefTRIAXone (ROCEPHIN)  IV 2 g (10/11/19 1714)   And  . metronidazole 500 mg (10/12/19 0124)  . dialysis solution 1.5% low-MG/low-CA     . aspirin EC  81 mg Oral Daily  . atorvastatin  10 mg Oral Daily  . calcitRIOL  0.25 mcg Oral Daily  . carvedilol  25  mg Oral BID WC  . darbepoetin (ARANESP) injection - NON-DIALYSIS  100 mcg Subcutaneous Q Thu-1800  . furosemide  80 mg Oral BID  . gabapentin  100 mg Oral QHS  . gentamicin cream  1 application Topical Daily  . heparin  5,000 Units Subcutaneous Q8H  . hydrALAZINE  100 mg Oral BID  . insulin aspart  0-6 Units Subcutaneous TID WC  . isosorbide mononitrate  15 mg Oral Daily  . lisinopril  40 mg Oral Daily  . spironolactone  25 mg Oral Daily

## 2019-10-13 LAB — CBC WITH DIFFERENTIAL/PLATELET
Abs Immature Granulocytes: 0.08 10*3/uL — ABNORMAL HIGH (ref 0.00–0.07)
Basophils Absolute: 0 10*3/uL (ref 0.0–0.1)
Basophils Relative: 0 %
Eosinophils Absolute: 0.2 10*3/uL (ref 0.0–0.5)
Eosinophils Relative: 2 %
HCT: 26.4 % — ABNORMAL LOW (ref 36.0–46.0)
Hemoglobin: 8.4 g/dL — ABNORMAL LOW (ref 12.0–15.0)
Immature Granulocytes: 1 %
Lymphocytes Relative: 19 %
Lymphs Abs: 1.5 10*3/uL (ref 0.7–4.0)
MCH: 30.7 pg (ref 26.0–34.0)
MCHC: 31.8 g/dL (ref 30.0–36.0)
MCV: 96.4 fL (ref 80.0–100.0)
Monocytes Absolute: 0.5 10*3/uL (ref 0.1–1.0)
Monocytes Relative: 6 %
Neutro Abs: 5.5 10*3/uL (ref 1.7–7.7)
Neutrophils Relative %: 72 %
Platelets: 200 10*3/uL (ref 150–400)
RBC: 2.74 MIL/uL — ABNORMAL LOW (ref 3.87–5.11)
RDW: 14.4 % (ref 11.5–15.5)
WBC: 7.8 10*3/uL (ref 4.0–10.5)
nRBC: 0 % (ref 0.0–0.2)

## 2019-10-13 LAB — BASIC METABOLIC PANEL
Anion gap: 12 (ref 5–15)
BUN: 30 mg/dL — ABNORMAL HIGH (ref 8–23)
CO2: 26 mmol/L (ref 22–32)
Calcium: 8.1 mg/dL — ABNORMAL LOW (ref 8.9–10.3)
Chloride: 94 mmol/L — ABNORMAL LOW (ref 98–111)
Creatinine, Ser: 7.05 mg/dL — ABNORMAL HIGH (ref 0.44–1.00)
GFR calc Af Amer: 7 mL/min — ABNORMAL LOW (ref >60)
GFR calc non Af Amer: 6 mL/min — ABNORMAL LOW (ref >60)
Glucose, Bld: 258 mg/dL — ABNORMAL HIGH (ref 70–99)
Potassium: 2.9 mmol/L — ABNORMAL LOW (ref 3.5–5.1)
Sodium: 132 mmol/L — ABNORMAL LOW (ref 135–145)

## 2019-10-13 LAB — GLUCOSE, CAPILLARY
Glucose-Capillary: 182 mg/dL — ABNORMAL HIGH (ref 70–99)
Glucose-Capillary: 167 mg/dL — ABNORMAL HIGH (ref 70–99)
Glucose-Capillary: 143 mg/dL — ABNORMAL HIGH (ref 70–99)
Glucose-Capillary: 224 mg/dL — ABNORMAL HIGH (ref 70–99)

## 2019-10-13 MED ORDER — HEPARIN 1000 UNIT/ML FOR PERITONEAL DIALYSIS: 500.0000 [IU] | INTRAMUSCULAR | Status: DC | PRN

## 2019-10-13 MED ORDER — DELFLEX-LC/2.5% DEXTROSE 394 MOSM/L IP SOLN
INTRAPERITONEAL | Status: DC
  Administered 2019-10-15: 5000 mL via INTRAPERITONEAL

## 2019-10-13 MED ORDER — DARBEPOETIN ALFA 100 MCG/0.5ML IJ SOSY
100.0000 ug | PREFILLED_SYRINGE | INTRAMUSCULAR | Status: DC
  Administered 2019-10-13: 100 ug via SUBCUTANEOUS
  Filled 2019-10-13: qty 0.5

## 2019-10-13 MED ORDER — HEPARIN 1000 UNIT/ML FOR PERITONEAL DIALYSIS
INTRAPERITONEAL | Status: DC | PRN
  Filled 2019-10-13: qty 3000

## 2019-10-13 MED ORDER — GENTAMICIN SULFATE 0.1 % EX CREA
1.0000 "application " | TOPICAL_CREAM | Freq: Every day | CUTANEOUS | Status: DC
  Administered 2019-10-13 – 2019-10-18 (×6): 1 via TOPICAL
  Filled 2019-10-13: qty 15

## 2019-10-13 NOTE — Progress Notes (Signed)
Subjective:  LLQ pain is a little better, eating soft foods  Objective Vital signs in last 24 hours: Vitals:   10/12/19 2130 10/13/19 0500 10/13/19 0835 10/13/19 1633  BP: 126/72 120/76 119/61 (!) 160/69  Pulse: 64 67 70 81  Resp: 18 17 18 18   Temp: 97.6 F (36.4 C) 98 F (36.7 C) 97.6 F (36.4 C) 98.5 F (36.9 C)  TempSrc: Oral Oral Oral Oral  SpO2: 97% 98% 99% 99%  Weight:       Weight change: -1.9 kg  Physical Exam: General: alert /pleasant  Female NAD Heart: RRR , No m,r,g Lungs: CTA  Abdomen: BS pos, soft  PD cath R lower quad nontender no dc at site, Non distended  Extremities: no pedal edema   Dialysis Access: pd Cath    Home meds:  - furosemide 80 bid/ lisinopril 40/ hydralazine 100 bid/ carvedilol 25 bid/ aldactone 25 qd  - glipizide 10 bid/ glargine 4u hs/ novolog 3u ac  - gabapentin 100 hs  - isosorbide mononitrate 15 qd/ atorvastatin 10/ aspirin 81  - sevelamer carb 1600mg   ac tid  - prn's/ vitamins/ supplements   Outpt PD: 5 exchanges, 2400 fill vol, 1.5 hr dwells, edw 68.5kg  -mircera 150 ug on 12/17  Problem/Plan:  1. Acute sigmoid diverticulitis - per primary team, on IV abx and clear liquids tolerating /Noted GI saw and sign off reccommended surg see as op  2. ESRD - on PD x 1 yr.  PD going okay 3. Volume/ BP - BP's appear okay. On mult BP meds at home, can use these here as needed. / states using Furosemide only prn at home will dc here ( PD uf as needed ) UP 3kg, use all 2.5% tonite 4. DM on insulin 5. H/o CHF 6. Anemia ckd - last esa was on 12/17, would be due tomorrow will reorder. Hb is 8.5>8.4 today .     Kelly Splinter, MD 10/13/2019, 4:54 PM      Labs: Basic Metabolic Panel: Recent Labs  Lab 10/11/19 0457 10/12/19 0405 10/13/19 0259  NA 137 135 132*  K 2.8* 2.8* 2.9*  CL 99 97* 94*  CO2 25 26 26   GLUCOSE 169* 208* 258*  BUN 43* 31* 30*  CREATININE 8.39* 7.24* 7.05*  CALCIUM 8.2* 8.2* 8.1*   Liver Function Tests: Recent Labs   Lab 10/10/19 1418  AST 30  ALT 18  ALKPHOS 55  BILITOT 1.1  PROT 6.6  ALBUMIN 2.4*   Recent Labs  Lab 10/10/19 1418  LIPASE 33   No results for input(s): AMMONIA in the last 168 hours. CBC: Recent Labs  Lab 10/10/19 1418 10/11/19 0457 10/12/19 0405 10/13/19 0259  WBC 10.8* 11.8* 9.0 7.8  NEUTROABS 8.3*  --  6.3 5.5  HGB 9.9* 8.5* 8.4* 8.4*  HCT 30.7* 26.8* 25.9* 26.4*  MCV 98.1 97.1 96.3 96.4  PLT 212 203 206 200   Cardiac Enzymes: No results for input(s): CKTOTAL, CKMB, CKMBINDEX, TROPONINI in the last 168 hours. CBG: Recent Labs  Lab 10/12/19 1652 10/12/19 2128 10/13/19 0757 10/13/19 1158 10/13/19 1642  GLUCAP 114* 189* 182* 167* 143*    Medications: . cefTRIAXone (ROCEPHIN)  IV 2 g (10/13/19 1627)   And  . metronidazole 500 mg (10/13/19 1024)  . dialysis solution 1.5% low-MG/low-CA     . aspirin EC  81 mg Oral Daily  . atorvastatin  10 mg Oral Daily  . calcitRIOL  0.25 mcg Oral Daily  . carvedilol  25 mg Oral BID WC  . darbepoetin (ARANESP) injection - NON-DIALYSIS  100 mcg Subcutaneous Q Fri-1800  . furosemide  80 mg Oral BID  . gabapentin  100 mg Oral QHS  . gentamicin cream  1 application Topical Daily  . heparin  5,000 Units Subcutaneous Q8H  . hydrALAZINE  100 mg Oral BID  . insulin aspart  0-6 Units Subcutaneous TID WC  . isosorbide mononitrate  15 mg Oral Daily  . lisinopril  40 mg Oral Daily  . sevelamer carbonate  1,600 mg Oral TID WC  . spironolactone  25 mg Oral Daily

## 2019-10-13 NOTE — Progress Notes (Signed)
Patricia [name], Patricia DOA: 10/10/2019 PCP: Ladell Pier, Patricia  HPI/Recap [name] past 59 hours: 64 year old female with past medical history [name] ESRD on peritoneal [name], Patricia [name] left lower quadrant abdominal pain and admitted for sigmoid diverticulitis.  Patient has been put on clear liquids and IV antibiotics.  No signs [name] abscess noted.  Patient seems to be feeling a little bit better, still some left lower quadrant abdominal pain.  Nephrology has been consulted and patient continues to get peritoneal [name] without incident.  10/13/19: Seen and examined.  Reports nausea without vomiting and left lower quadrant abdominal pain.  She is on full liquid diet.  We will continue IV antibiotics, pain management and advance diet possibly tomorrow as tolerated.   Assessment/Plan: Principal Problem:   Sigmoid diverticulitis Active Problems:   Hypertension associated with diabetes ([place])   Diabetes mellitus type 2 with complications ([facility])   Patricia combined systolic and diastolic CHF (congestive heart failure) ([facility])   Periumbilical hernia   ESRD on peritoneal [name] ([place])   Proximal sigmoid diverticulitis With no definite abscess noted at this time.  Pigtail drainage catheter seen in the anterior portion [name] the pelvis.  Continued presence [name] moderate size periumbilical hernia which contains loops [name] small bowel, but does not result in incarceration or obstruction. Still complains [name] nausea and abdominal pain this morning. Continue IV antibiotics and IV antiemetics as needed Continue pain management Currently on full liquid diet, will advance diet possibly tomorrow as tolerated.  ESRD on peritoneal [name] Continue PD No acute issues Nephrology following  Hypervolemic hyponatremia/hypokalemia Volume managed by  hemodialysis Electrolytes managed by hemodialysis Continue daily renal panel Nephrology following  Anemia [name] Patricia disease Hemoglobin is stable No overt bleeding  Moderate periumbilical hernia Contains loops [name] small bowel but does not result in incarceration or obstruction per CT scan abdomen and pelvis without contrast done on 10/10/2019.  Patricia combined systolic and diastolic CHF Last 2D echo done on 12/05/2018 showed LVEF 40 to 45% with evidence [name] diastolic dysfunction Volume status managed by hemodialysis Continue cardiac medications  Type 2 diabetes with hyperglycemia Last hemoglobin A1c 7.0 on 06/16/19. Repeat A1c Continue insulin coverage  Essential hypertension Continue home medications Blood pressure is at goal.   Code Status: Full code  Family Communication: Updated daughters by phone  Disposition Plan: Home hopefully in the next few days once able to tolerate solid food   Consultants:  Nephrology  GI  Procedures:  ED hemodialysis.  Antimicrobials:  IV Rocephin and Flagyl 12/[name]>>  DVT prophylaxis: Subcu heparin 3 times daily.    Objective: Vitals:   10/12/19 1355 10/12/19 2015 10/12/19 2130 10/13/19 0500  BP: 133/60 (!) 164/78 126/72 120/76  Pulse: 72  64 67  Resp: 16 16 18 17   Temp: 98.3 F (36.8 C) (!) 97.3 F (36.3 C) 97.6 F (36.4 C) 98 F (36.7 C)  TempSrc: Oral Oral Oral Oral  SpO2: 98% 98% 97% 98%  Weight:  71.3 kg     No intake or output data in the 24 hours ending 10/13/19 0823 Filed Weights   10/12/19 0500 10/12/19 0718 10/12/19 2015  Weight: 72.1 kg 70.7 kg 71.3 kg    Exam:  . General: 64 y.o. year-old female well developed well nourished in no acute distress.  Alert and oriented x3. . Cardiovascular: Regular rate and rhythm with  no rubs or gallops.  No thyromegaly or JVD noted.   Marland Kitchen Respiratory: Clear to auscultation with no wheezes or rales. Good inspiratory effort. . Abdomen: Soft tender diffusely with  normal bowel sounds x4 quadrants.  Abdominal hernia noted.  Drainage catheter in place. . Musculoskeletal: Trace lower extremity edema.  Marland Kitchen Psychiatry: Mood is appropriate for condition and setting   Data Reviewed: CBC: Recent Labs  Lab 10/10/19 1418 12/[name]/20 0457 10/12/19 0405 10/13/19 0259  WBC 10.8* 11.8* 9.0 7.8  NEUTROABS 8.3*  --  6.3 5.5  HGB 9.9* 8.5* 8.4* 8.4*  HCT [name].7* 26.8* 25.9* 26.4*  MCV 98.1 97.1 96.3 96.4  PLT 212 203 206 169   Basic Metabolic Panel: Recent Labs  Lab 10/10/19 1418 10/10/19 1505 12/[name]/20 0457 10/12/19 0405 10/13/19 0259  NA 136  --  137 135 132*  K 3.1*  --  2.8* 2.8* 2.9*  CL 96*  --  99 97* 94*  CO2 27  --  25 26 26   GLUCOSE 198*  --  169* 208* 258*  BUN 36*  --  43* 31* [name]*  CREATININE 7.54*  --  8.39* 7.24* 7.05*  CALCIUM 8.3*  --  8.2* 8.2* 8.1*  MG  --  1.7  --   --   --    GFR: Estimated Creatinine Clearance: 7.9 mL/min (A) (by C-G formula based on SCr [name] 7.05 mg/dL (H)). Liver Function Tests: Recent Labs  Lab 10/10/19 1418  AST [name]  ALT 18  ALKPHOS 55  BILITOT 1.1  PROT 6.6  ALBUMIN 2.4*   Recent Labs  Lab 10/10/19 1418  LIPASE 33   No results for input(s): AMMONIA in the last 168 hours. Coagulation Profile: No results for input(s): INR, PROTIME in the last 168 hours. Cardiac Enzymes: No results for input(s): CKTOTAL, CKMB, CKMBINDEX, TROPONINI in the last 168 hours. BNP (last 3 results) No results for input(s): PROBNP in the last 8760 hours. HbA1C: No results for input(s): HGBA1C in the last 72 hours. CBG: Recent Labs  Lab 10/12/19 0743 10/12/19 1144 10/12/19 1652 10/12/19 2128 10/13/19 0757  GLUCAP 107* 153* 114* 189* 182*   Lipid Profile: No results for input(s): CHOL, HDL, LDLCALC, TRIG, CHOLHDL, LDLDIRECT in the last 72 hours. Thyroid Function Tests: No results for input(s): TSH, T4TOTAL, FREET4, T3FREE, THYROIDAB in the last 72 hours. Anemia Panel: No results for input(s): VITAMINB12, FOLATE,  FERRITIN, TIBC, IRON, RETICCTPCT in the last 72 hours. Urine analysis:    Component Value Date/Time   COLORURINE YELLOW 10/10/2019 1801   APPEARANCEUR CLEAR 10/10/2019 1801   LABSPEC 1.013 10/10/2019 1801   PHURINE 6.0 10/10/2019 1801   GLUCOSEU 50 (A) 10/10/2019 1801   HGBUR NEGATIVE 10/10/2019 1801   BILIRUBINUR NEGATIVE 10/10/2019 1801   BILIRUBINUR 1.0 10/09/2016 1306   KETONESUR NEGATIVE 10/10/2019 1801   PROTEINUR >=300 (A) 10/10/2019 1801   UROBILINOGEN 0.2 04/24/2018 1529   NITRITE NEGATIVE 10/10/2019 1801   LEUKOCYTESUR NEGATIVE 10/10/2019 1801   Sepsis Labs: @LABRCNTIP (procalcitonin:4,lacticidven:4)  ) Recent Results (from the past 240 hour(s))  SARS CORONAVIRUS 2 (TAT 6-24 HRS) Nasopharyngeal Nasopharyngeal Swab     Status: None   Collection Time: 10/10/19  6:16 PM   Specimen: Nasopharyngeal Swab  Result Value Ref Range Status   SARS Coronavirus 2 NEGATIVE NEGATIVE Final    Comment: (NOTE) SARS-CoV-2 target nucleic acids are NOT DETECTED. The SARS-CoV-2 RNA is generally detectable in upper and lower respiratory specimens during the acute phase [name] infection. Negative results do not  preclude SARS-CoV-2 infection, do not rule out co-infections with other pathogens, and should not be used as the sole basis for treatment or other patient management decisions. Negative results must be combined with clinical observations, patient history, and epidemiological information. The expected result is Negative. Fact Sheet for Patients: SugarRoll.be Fact Sheet for Healthcare Providers: https://www.woods-mathews.com/ This test is not yet approved or cleared by the [place] FDA and  has been authorized for detection and/or diagnosis [name] SARS-CoV-2 by FDA under an Emergency Use Authorization (EUA). This EUA will remain  in effect (meaning this test can be used) for the duration [name] the COVID-19 declaration under Section 56 4(b)(1) [name]  the Act, 21 U.S.C. section 360bbb-3(b)(1), unless the authorization is terminated or revoked sooner. Performed at [facility], [place] [address] [place], [place] [address]       Studies: No results found.  Scheduled Meds: . aspirin EC  81 mg Oral Daily  . atorvastatin  10 mg Oral Daily  . calcitRIOL  0.25 mcg Oral Daily  . carvedilol  25 mg Oral BID WC  . darbepoetin (ARANESP) injection - NON-[name]  100 mcg Subcutaneous Q Thu-1800  . furosemide  80 mg Oral BID  . gabapentin  100 mg Oral QHS  . gentamicin cream  1 application Topical Daily  . heparin  5,000 Units Subcutaneous Q8H  . hydrALAZINE  100 mg Oral BID  . insulin aspart  0-6 Units Subcutaneous TID WC  . isosorbide mononitrate  15 mg Oral Daily  . lisinopril  40 mg Oral Daily  . sevelamer carbonate  1,600 mg Oral TID WC  . spironolactone  25 mg Oral Daily    Continuous Infusions: . cefTRIAXone (ROCEPHIN)  IV 2 g (10/13/19 0228)   And  . metronidazole 500 mg (10/13/19 0458)  . [name] solution 1.5% low-MG/low-CA       LOS: 2 days     Kayleen Memos, Patricia [facility] Pager [phone]  If 7PM-7AM, please contact night-coverage [email or web address] Password [facility] 10/13/2019, 8:23 AM

## 2019-10-13 NOTE — Evaluation (Signed)
Physical Therapy Evaluation Patient Details Name: Patricia [name] MRN: [id number] DOB: May 30, 1956 Today's Date: 10/13/2019   History of Present Illness  Pt is 64 year old female with past medical history of ESRD on peritoneal dialysis, chronic combined systolic/diastolic CHF and diabetes type 2 on insulin came to the emergency room on 12/29 with several days of left lower quadrant abdominal pain and admitted for sigmoid diverticulitis currently being managed with antibiotics and clear liquids.  Clinical Impression  Pt admitted with above diagnosis. Pt was able to transfer and ambulate 35' with RW and min guard.  She was limited by fatigue and nausea.  Pt with good support at home and expected to progress well with therapy.  Pt currently with functional limitations due to the deficits listed below (see PT Problem List). Pt will benefit from skilled PT to increase their independence and safety with mobility to allow discharge to the venue listed below.       Follow Up Recommendations Home health PT;Supervision for mobility/OOB    Equipment Recommendations  None recommended by PT    Recommendations for Other Services       Precautions / Restrictions Precautions Precautions: None      Mobility  Bed Mobility Overal bed mobility: Needs Assistance Bed Mobility: Supine to Sit;Sit to Supine     Supine to sit: Min guard;HOB elevated Sit to supine: Min guard;HOB elevated   General bed mobility comments: increased time, use of bed rails  Transfers Overall transfer level: Needs assistance Equipment used: Rolling walker (2 wheeled) Transfers: Sit to/from Stand Sit to Stand: Min guard         General transfer comment: increased time; cues for safe hand placement  Ambulation/Gait Ambulation/Gait assistance: Min guard Gait Distance (Feet): 35 Feet Assistive device: Rolling walker (2 wheeled) Gait Pattern/deviations: Step-through pattern     General Gait Details: decreased speed;  some reports of fatigue/lightheadiness; no LOB; limited by nausea  Stairs            Wheelchair Mobility    Modified Rankin (Stroke Patients Only)       Balance Overall balance assessment: Needs assistance Sitting-balance support: No upper extremity supported;Feet supported Sitting balance-Leahy Scale: Good     Standing balance support: Bilateral upper extremity supported;During functional activity Standing balance-Leahy Scale: Good                               Pertinent Vitals/Pain Pain Assessment: 0-10 Pain Score: 1  Pain Location: stomach Pain Descriptors / Indicators: Cramping Pain Intervention(s): Limited activity within patient's tolerance    Home Living Family/patient expects to be discharged to:: Private residence Living Arrangements: Children(daughter) Available Help at Discharge: Family;Available PRN/intermittently(daughter works at night) Type of Home: Apartment Home Access: Level entry     Home Layout: One level Home Equipment: Cane - single point;Shower seat      Prior Function Level of Independence: Needs assistance   Gait / Transfers Assistance Needed: Rarely uses SPC; ambulates short community distances regularly; community ambulation pushing grocery cart at times  ADL's / Homemaking Assistance Needed: Typically able to do ADLs unless sick then has min A; does not drive        Hand Dominance        Extremity/Trunk Assessment   Upper Extremity Assessment Upper Extremity Assessment: Overall WFL for tasks assessed    Lower Extremity Assessment Lower Extremity Assessment: Overall WFL for tasks assessed    Cervical / Trunk Assessment  Cervical / Trunk Assessment: Normal  Communication   Communication: No difficulties  Cognition Arousal/Alertness: Awake/alert Behavior During Therapy: WFL for tasks assessed/performed Overall Cognitive Status: Within Functional Limits for tasks assessed                                         General Comments      Exercises     Assessment/Plan    PT Assessment Patient needs continued PT services  PT Problem List Decreased strength;Decreased mobility;Decreased activity tolerance;Decreased balance;Decreased knowledge of use of DME       PT Treatment Interventions DME instruction;Therapeutic activities;Gait training;Therapeutic exercise;Patient/family education;Stair training;Balance training;Functional mobility training    PT Goals (Current goals can be found in the Care Plan section)  Acute Rehab PT Goals Patient Stated Goal: return to daughter's home at d/c PT Goal Formulation: With patient/family Time For Goal Achievement: 10/27/19 Potential to Achieve Goals: Good    Frequency Min 3X/week   Barriers to discharge        Co-evaluation               AM-PAC PT "6 Clicks" Mobility  Outcome Measure Help needed turning from your back to your side while in a flat bed without using bedrails?: None Help needed moving from lying on your back to sitting on the side of a flat bed without using bedrails?: None Help needed moving to and from a bed to a chair (including a wheelchair)?: None Help needed standing up from a chair using your arms (e.g., wheelchair or bedside chair)?: None Help needed to walk in hospital room?: None Help needed climbing 3-5 steps with a railing? : A Little 6 Click Score: 23    End of Session Equipment Utilized During Treatment: Gait belt Activity Tolerance: Patient tolerated treatment well Patient left: in bed;with call bell/phone within reach;with bed alarm set;with family/visitor present Nurse Communication: Mobility status PT Visit Diagnosis: Other abnormalities of gait and mobility (R26.89)    Time: 7209-4709 PT Time Calculation (min) (ACUTE ONLY): 18 min   Charges:   PT Evaluation $PT Eval Low Complexity: 1 Low          Maggie Font, PT Acute Rehab Services Pager [phone] [facility]  [phone] [facility] [place] 10/13/2019, 2:15 PM

## 2019-10-13 NOTE — Plan of Care (Signed)

## 2019-10-14 LAB — CBC WITH DIFFERENTIAL/PLATELET
Abs Immature Granulocytes: 0.09 10*3/uL — ABNORMAL HIGH (ref 0.00–0.07)
Basophils Absolute: 0 10*3/uL (ref 0.0–0.1)
Basophils Relative: 1 %
Eosinophils Absolute: 0.1 10*3/uL (ref 0.0–0.5)
Eosinophils Relative: 2 %
HCT: 26 % — ABNORMAL LOW (ref 36.0–46.0)
Hemoglobin: 8.4 g/dL — ABNORMAL LOW (ref 12.0–15.0)
Immature Granulocytes: 1 %
Lymphocytes Relative: 16 %
Lymphs Abs: 1.3 10*3/uL (ref 0.7–4.0)
MCH: 30.7 pg (ref 26.0–34.0)
MCHC: 32.3 g/dL (ref 30.0–36.0)
MCV: 94.9 fL (ref 80.0–100.0)
Monocytes Absolute: 0.4 10*3/uL (ref 0.1–1.0)
Monocytes Relative: 5 %
Neutro Abs: 6 10*3/uL (ref 1.7–7.7)
Neutrophils Relative %: 75 %
Platelets: 204 10*3/uL (ref 150–400)
RBC: 2.74 MIL/uL — ABNORMAL LOW (ref 3.87–5.11)
RDW: 14.1 % (ref 11.5–15.5)
WBC: 7.9 10*3/uL (ref 4.0–10.5)
nRBC: 0 % (ref 0.0–0.2)

## 2019-10-14 LAB — BASIC METABOLIC PANEL
Anion gap: 13 (ref 5–15)
Anion gap: 11 (ref 5–15)
BUN: 24 mg/dL — ABNORMAL HIGH (ref 8–23)
BUN: 24 mg/dL — ABNORMAL HIGH (ref 8–23)
CO2: 25 mmol/L (ref 22–32)
CO2: 28 mmol/L (ref 22–32)
Calcium: 8.1 mg/dL — ABNORMAL LOW (ref 8.9–10.3)
Calcium: 8 mg/dL — ABNORMAL LOW (ref 8.9–10.3)
Chloride: 93 mmol/L — ABNORMAL LOW (ref 98–111)
Chloride: 92 mmol/L — ABNORMAL LOW (ref 98–111)
Creatinine, Ser: 6.5 mg/dL — ABNORMAL HIGH (ref 0.44–1.00)
Creatinine, Ser: 6.63 mg/dL — ABNORMAL HIGH (ref 0.44–1.00)
GFR calc Af Amer: 7 mL/min — ABNORMAL LOW (ref >60)
GFR calc Af Amer: 7 mL/min — ABNORMAL LOW (ref >60)
GFR calc non Af Amer: 6 mL/min — ABNORMAL LOW (ref >60)
GFR calc non Af Amer: 6 mL/min — ABNORMAL LOW (ref >60)
Glucose, Bld: 345 mg/dL — ABNORMAL HIGH (ref 70–99)
Glucose, Bld: 204 mg/dL — ABNORMAL HIGH (ref 70–99)
Potassium: 2.9 mmol/L — ABNORMAL LOW (ref 3.5–5.1)
Potassium: 3.2 mmol/L — ABNORMAL LOW (ref 3.5–5.1)
Sodium: 131 mmol/L — ABNORMAL LOW (ref 135–145)
Sodium: 131 mmol/L — ABNORMAL LOW (ref 135–145)

## 2019-10-14 LAB — GLUCOSE, CAPILLARY
Glucose-Capillary: 232 mg/dL — ABNORMAL HIGH (ref 70–99)
Glucose-Capillary: 68 mg/dL — ABNORMAL LOW (ref 70–99)
Glucose-Capillary: 94 mg/dL (ref 70–99)
Glucose-Capillary: 180 mg/dL — ABNORMAL HIGH (ref 70–99)
Glucose-Capillary: 348 mg/dL — ABNORMAL HIGH (ref 70–99)

## 2019-10-14 LAB — HEPATIC FUNCTION PANEL
ALT: 11 U/L (ref 0–44)
AST: 15 U/L (ref 15–41)
Albumin: 1.8 g/dL — ABNORMAL LOW (ref 3.5–5.0)
Alkaline Phosphatase: 50 U/L (ref 38–126)
Bilirubin, Direct: <0.1 mg/dL (ref 0.0–0.2)
Total Bilirubin: 0.2 mg/dL — ABNORMAL LOW (ref 0.3–1.2)
Total Protein: 5.3 g/dL — ABNORMAL LOW (ref 6.5–8.1)

## 2019-10-14 MED ORDER — POTASSIUM CHLORIDE CRYS ER 20 MEQ PO TBCR
40.0000 meq | EXTENDED_RELEASE_TABLET | ORAL | Status: DC
  Filled 2019-10-14: qty 2

## 2019-10-14 MED ORDER — POTASSIUM CHLORIDE CRYS ER 20 MEQ PO TBCR
40.0000 meq | EXTENDED_RELEASE_TABLET | Freq: Once | ORAL | Status: AC
  Administered 2019-10-14: 40 meq via ORAL
  Filled 2019-10-14: qty 2

## 2019-10-14 MED ORDER — POTASSIUM CHLORIDE CRYS ER 20 MEQ PO TBCR
20.0000 meq | EXTENDED_RELEASE_TABLET | Freq: Two times a day (BID) | ORAL | Status: AC
  Administered 2019-10-14 (×2): 20 meq via ORAL
  Filled 2019-10-14: qty 1

## 2019-10-14 NOTE — Progress Notes (Addendum)
Subjective:  Still some abd discomfort tolerating CCPD with clear pd fluid/    Objective Vital signs in last 24 hours: Vitals:   10/13/19 1943 10/13/19 2109 10/14/19 0500 10/14/19 0504  BP: (!) 124/55 111/61  118/69  Pulse: 75 77  77  Resp: 16 18  17   Temp: 99.7 F (37.6 C) 98.4 F (36.9 C)  98.5 F (36.9 C)  TempSrc: Oral Oral  Oral  SpO2: 95% 95%  96%  Weight:   76 kg    Weight change: 2.6 kg   Physical Exam: General: alert /pleasant  Female NAD Heart: RRR , No m,r,g Lungs: CTA  Abdomen: BS pos, soft  PD cath R lower quad nontender no dc at site, Non distended  Extremities: no pedal edema   Dialysis Access: PD Cath    Home meds: - furosemide 80 bid/ lisinopril 40/ hydralazine 100 bid/ carvedilol 25 bid/ aldactone 25 qd - glipizide 10 bid/ glargine 4u hs/ novolog 3u ac - gabapentin 100 hs - isosorbide mononitrate 15 qd/ atorvastatin 10/ aspirin 81 - sevelamer carb 1600mg   ac tid - prn's/ vitamins/ supplements  OutptPD:5 exchanges, 2400 fill vol, 1.5 hr dwells, edw 68.5kg -mircera 150 ug on 12/17  Problem/Plan: 1. Acute sigmoid diverticulitis - per primary team, on IV abx and clear liquids tolerating /Noted GI saw and sign off reccommended surg see as op  2. ESRD - on PD x 1 yr.  CCPD going okay/ tells me Minimal UOP and not much documented will dc PO Lasix in ESRD pt control vol with pd , 3. Hypokalemia - K 2.9  replacement with po K  /  4. Volume/ BP - BP's appear okay. On mult BP meds at home, can use these here as needed. / states using Furosemide only prn at home will dc here ( PD uf as needed ) ?? bed wts UP 3kg, use all 2.5% tonight bp ok and no sig vol on exam  5. H/o CHF 6. Anemia ckd - last esa was on 12/17, would be due tomorrow will reorder. Hb is 8.4 today .Give Aranesp  100 Sq  Today  7. MBD- fu ca Maren Reamer  On Po calcitriol / no binder currently    Ernest Haber, PA-C [place] [phone] 10/14/2019,10:04 AM  LOS:  3 days   Pt seen, examined and agree w A/P as above.  Kelly Splinter  MD 10/14/2019, 11:00 AM    Labs: Basic Metabolic Panel: Recent Labs  Lab 10/12/19 0405 10/13/19 0259 10/14/19 0307  NA 135 132* 131*  K 2.8* 2.9* 2.9*  CL 97* 94* 93*  CO2 26 26 25   GLUCOSE 208* 258* 345*  BUN 31* 30* 24*  CREATININE 7.24* 7.05* 6.50*  CALCIUM 8.2* 8.1* 8.1*   Liver Function Tests: Recent Labs  Lab 10/10/19 1418 10/14/19 0307  AST 30 15  ALT 18 11  ALKPHOS 55 50  BILITOT 1.1 0.2*  PROT 6.6 5.3*  ALBUMIN 2.4* 1.8*   Recent Labs  Lab 10/10/19 1418  LIPASE 33   No results for input(s): AMMONIA in the last 168 hours. CBC: Recent Labs  Lab 10/10/19 1418 10/11/19 0457 10/12/19 0405 10/13/19 0259 10/14/19 0307  WBC 10.8* 11.8* 9.0 7.8 7.9  NEUTROABS 8.3*  --  6.3 5.5 6.0  HGB 9.9* 8.5* 8.4* 8.4* 8.4*  HCT 30.7* 26.8* 25.9* 26.4* 26.0*  MCV 98.1 97.1 96.3 96.4 94.9  PLT 212 203 206 200 204   Cardiac Enzymes: No results for input(s):  CKTOTAL, CKMB, CKMBINDEX, TROPONINI in the last 168 hours. CBG: Recent Labs  Lab 10/13/19 0757 10/13/19 1158 10/13/19 1642 10/13/19 2110 10/14/19 0739  GLUCAP 182* 167* 143* 224* 232*    Studies/Results: No results found. Medications: . cefTRIAXone (ROCEPHIN)  IV 2 g (10/13/19 1627)   And  . metronidazole 500 mg (10/14/19 0145)  . dialysis solution 1.5% low-MG/low-CA    . dialysis solution 2.5% low-MG/low-CA     . aspirin EC  81 mg Oral Daily  . atorvastatin  10 mg Oral Daily  . calcitRIOL  0.25 mcg Oral Daily  . carvedilol  25 mg Oral BID WC  . darbepoetin (ARANESP) injection - NON-DIALYSIS  100 mcg Subcutaneous Q Fri-1800  . gabapentin  100 mg Oral QHS  . gentamicin cream  1 application Topical Daily  . heparin  5,000 Units Subcutaneous Q8H  . hydrALAZINE  100 mg Oral BID  . insulin aspart  0-6 Units Subcutaneous TID WC  . isosorbide mononitrate  15 mg Oral Daily  . lisinopril  40 mg Oral Daily  . potassium chloride  20 mEq  Oral BID  . sevelamer carbonate  1,600 mg Oral TID WC  . spironolactone  25 mg Oral Daily

## 2019-10-14 NOTE — Progress Notes (Signed)
PROGRESS NOTE  Patricia [name] PYP:[id number] DOB: 1956/07/21 DOA: 10/10/2019 PCP: Ladell Pier, MD  Brief History   64 year old female with past medical history of ESRD on peritoneal dialysis, chronic combined systolic/diastolic CHF and diabetes type 2 on insulin came to the emergency room on 12/29 with several days of left lower quadrant abdominal pain and admitted for sigmoid diverticulitis.  Patient has been put on clear liquids and IV antibiotics. No signs of abscess noted. Patient seems to be feeling a little bit better, still some left lower quadrant abdominal pain. Nephrology has been consulted and patient continues to get peritoneal dialysis without incident.  Consultants  . Nephrology  Antibiotics   Anti-infectives (From admission, onward)   Start     Dose/Rate Route Frequency Ordered Stop   10/11/19 1800  cefTRIAXone (ROCEPHIN) 2 g in sodium chloride 0.9 % 100 mL IVPB     2 g 200 mL/hr over 30 Minutes Intravenous Every 24 hours 10/10/19 2006     10/11/19 0200  metroNIDAZOLE (FLAGYL) IVPB 500 mg     500 mg 100 mL/hr over 60 Minutes Intravenous Every 8 hours 10/10/19 2006     10/10/19 1815  cefTRIAXone (ROCEPHIN) 2 g in sodium chloride 0.9 % 100 mL IVPB     2 g 200 mL/hr over 30 Minutes Intravenous  Once 10/10/19 1809 10/10/19 2024   10/10/19 1815  metroNIDAZOLE (FLAGYL) IVPB 500 mg     500 mg 100 mL/hr over 60 Minutes Intravenous  Once 10/10/19 1809 10/10/19 2024    .  Subjective  The patient is resting comfortably. No new complaints.  Objective   Vitals:  Vitals:   10/14/19 1431 10/14/19 1800  BP: (!) 145/61 (!) 128/57  Pulse: 74 77  Resp:  18  Temp: 97.6 F (36.4 C) 98.3 F (36.8 C)  SpO2: 98%     Exam:  Constitutional:  . The patient is awake, alert, and oriented x 3. No acute distress. Respiratory:  . No increased work of breathing. . No wheezes, rales, or rhonchi . No tactile fremitus Cardiovascular:  . Regular rate and rhythm . No  murmurs, ectopy, or gallups. . No lateral PMI. No thrills. Abdomen:  . Abdomen is soft, non-tender, non-distended . No hernias, masses, or organomegaly . Normoactive bowel sounds.  Musculoskeletal:  . No cyanosis, clubbing, or edema Skin:  . No rashes, lesions, ulcers . palpation of skin: no induration or nodules Neurologic:  . CN 2-12 intact . Sensation all 4 extremities intact Psychiatric:  . Mental status o Mood, affect appropriate o Orientation to person, place, time  . judgment and insight appear intact  I have personally reviewed the following:   Today's Data  . Vitals, BMP  Scheduled Meds: . aspirin EC  81 mg Oral Daily  . atorvastatin  10 mg Oral Daily  . calcitRIOL  0.25 mcg Oral Daily  . carvedilol  25 mg Oral BID WC  . darbepoetin (ARANESP) injection - NON-DIALYSIS  100 mcg Subcutaneous Q Fri-1800  . gabapentin  100 mg Oral QHS  . gentamicin cream  1 application Topical Daily  . heparin  5,000 Units Subcutaneous Q8H  . hydrALAZINE  100 mg Oral BID  . insulin aspart  0-6 Units Subcutaneous TID WC  . isosorbide mononitrate  15 mg Oral Daily  . lisinopril  40 mg Oral Daily  . sevelamer carbonate  1,600 mg Oral TID WC  . spironolactone  25 mg Oral Daily   Continuous Infusions: . cefTRIAXone (ROCEPHIN)  IV 2 g (10/14/19 1729)   And  . metronidazole 500 mg (10/14/19 1900)  . dialysis solution 1.5% low-MG/low-CA    . dialysis solution 2.5% low-MG/low-CA      Principal Problem:   Sigmoid diverticulitis Active Problems:   Hypertension associated with diabetes ([place])   Diabetes mellitus type 2 with complications ([facility])   Chronic combined systolic and diastolic CHF (congestive heart failure) ([facility])   Periumbilical hernia   ESRD on peritoneal dialysis ([place])   LOS: 3 days  Proximal sigmoid diverticulitis With no definite abscess noted at this time.  Pigtail drainage catheter seen in the anterior portion of the pelvis.  Continued presence of moderate size  periumbilical hernia which contains loops of small bowel, but does not result in incarceration or obstruction. Still complains of nausea and abdominal pain this morning. Continue IV antibiotics and IV antiemetics as needed Continue pain management Currently on full liquid diet, will advance diet possibly tomorrow as tolerated.  ESRD on peritoneal dialysis Continue PD No acute issues Nephrology following  Hypervolemic hyponatremia/hypokalemia Volume managed by hemodialysis Electrolytes managed by hemodialysis Continue daily renal panel Nephrology following  Anemia of chronic disease Hemoglobin is stable No overt bleeding  Moderate periumbilical hernia Contains loops of small bowel but does not result in incarceration or obstruction per CT scan abdomen and pelvis without contrast done on 10/10/2019.  Chronic combined systolic and diastolic CHF Last 2D echo done on 12/05/2018 showed LVEF 40 to 45% with evidence of diastolic dysfunction Volume status managed by hemodialysis Continue cardiac medications  Type 2 diabetes with hyperglycemia Last hemoglobin A1c 7.0 on 06/16/19. Repeat A1c Continue insulin coverage  Essential hypertension Continue home medications Blood pressure is at goal.  I have seen and examined this patient myself. I have spent 34 minutes in her evaluation and care.  DVT Prophylaxis: Subcutaneous heparin Code Status:Full code Family Communication:none available Disposition Plan:Home hopefully in the next few days once able to tolerate solid food

## 2019-10-15 LAB — RENAL FUNCTION PANEL
Albumin: 1.6 g/dL — ABNORMAL LOW (ref 3.5–5.0)
Anion gap: 13 (ref 5–15)
BUN: 19 mg/dL (ref 8–23)
CO2: 25 mmol/L (ref 22–32)
Calcium: 7.8 mg/dL — ABNORMAL LOW (ref 8.9–10.3)
Chloride: 92 mmol/L — ABNORMAL LOW (ref 98–111)
Creatinine, Ser: 6.01 mg/dL — ABNORMAL HIGH (ref 0.44–1.00)
GFR calc Af Amer: 8 mL/min — ABNORMAL LOW (ref >60)
GFR calc non Af Amer: 7 mL/min — ABNORMAL LOW (ref >60)
Glucose, Bld: 386 mg/dL — ABNORMAL HIGH (ref 70–99)
Phosphorus: 4.3 mg/dL (ref 2.5–4.6)
Potassium: 3.1 mmol/L — ABNORMAL LOW (ref 3.5–5.1)
Sodium: 130 mmol/L — ABNORMAL LOW (ref 135–145)

## 2019-10-15 LAB — GLUCOSE, CAPILLARY
Glucose-Capillary: 232 mg/dL — ABNORMAL HIGH (ref 70–99)
Glucose-Capillary: 141 mg/dL — ABNORMAL HIGH (ref 70–99)
Glucose-Capillary: 192 mg/dL — ABNORMAL HIGH (ref 70–99)
Glucose-Capillary: 309 mg/dL — ABNORMAL HIGH (ref 70–99)

## 2019-10-15 LAB — CBC WITH DIFFERENTIAL/PLATELET
Abs Immature Granulocytes: 0.13 10*3/uL — ABNORMAL HIGH (ref 0.00–0.07)
Basophils Absolute: 0 10*3/uL (ref 0.0–0.1)
Basophils Relative: 1 %
Eosinophils Absolute: 0.2 10*3/uL (ref 0.0–0.5)
Eosinophils Relative: 2 %
HCT: 25.2 % — ABNORMAL LOW (ref 36.0–46.0)
Hemoglobin: 8.2 g/dL — ABNORMAL LOW (ref 12.0–15.0)
Immature Granulocytes: 2 %
Lymphocytes Relative: 21 %
Lymphs Abs: 1.6 10*3/uL (ref 0.7–4.0)
MCH: 31.2 pg (ref 26.0–34.0)
MCHC: 32.5 g/dL (ref 30.0–36.0)
MCV: 95.8 fL (ref 80.0–100.0)
Monocytes Absolute: 0.5 10*3/uL (ref 0.1–1.0)
Monocytes Relative: 6 %
Neutro Abs: 5.5 10*3/uL (ref 1.7–7.7)
Neutrophils Relative %: 68 %
Platelets: 227 10*3/uL (ref 150–400)
RBC: 2.63 MIL/uL — ABNORMAL LOW (ref 3.87–5.11)
RDW: 14.2 % (ref 11.5–15.5)
WBC: 8 10*3/uL (ref 4.0–10.5)
nRBC: 0 % (ref 0.0–0.2)

## 2019-10-15 LAB — PHOSPHORUS: Phosphorus: 4.2 mg/dL (ref 2.5–4.6)

## 2019-10-15 MED ORDER — POTASSIUM CHLORIDE CRYS ER 20 MEQ PO TBCR
40.0000 meq | EXTENDED_RELEASE_TABLET | Freq: Once | ORAL | Status: AC
  Administered 2019-10-15: 40 meq via ORAL
  Filled 2019-10-15: qty 2

## 2019-10-15 NOTE — Progress Notes (Addendum)
[facility] Progress Note   Subjective: Up in chair. C/O being constipated, still LLQ abdominal pain.    Objective Vitals:   10/14/19 1800 10/14/19 2121 10/15/19 0619 10/15/19 0635  BP: (!) 128/57 122/65 138/61   Pulse: 77 75 81   Resp: 18 18 18    Temp: 98.3 F (36.8 C) 98.7 F (37.1 C) 98.6 F (37 C)   TempSrc: Oral Oral Oral   SpO2:  97% 96%   Weight:    73.5 kg   Physical Exam General: Pleasant older female in NAD Heart: S1,S2 RRR Lungs: CTAB A/P Abdomen: S, NT PD cath RLQ drsg intact Extremities: no LE edema.  Dialysis Access: PD cath as noted above   Additional Objective Labs: Basic Metabolic Panel: Recent Labs  Lab 10/14/19 0307 10/14/19 1603 10/15/19 0138  NA 131* 131* 130*  K 2.9* 3.2* 3.1*  CL 93* 92* 92*  CO2 25 28 25   GLUCOSE 345* 204* 386*  BUN 24* 24* 19  CREATININE 6.50* 6.63* 6.01*  CALCIUM 8.1* 8.0* 7.8*  PHOS  --   --  4.2  4.3   Liver Function Tests: Recent Labs  Lab 10/10/19 1418 10/14/19 0307 10/15/19 0138  AST 30 15  --   ALT 18 11  --   ALKPHOS 55 50  --   BILITOT 1.1 0.2*  --   PROT 6.6 5.3*  --   ALBUMIN 2.4* 1.8* 1.6*   Recent Labs  Lab 10/10/19 1418  LIPASE 33   CBC: Recent Labs  Lab 10/11/19 0457 10/12/19 0405 10/13/19 0259 10/14/19 0307 10/15/19 0138  WBC 11.8* 9.0 7.8 7.9 8.0  NEUTROABS  --  6.3 5.5 6.0 5.5  HGB 8.5* 8.4* 8.4* 8.4* 8.2*  HCT 26.8* 25.9* 26.4* 26.0* 25.2*  MCV 97.1 96.3 96.4 94.9 95.8  PLT 203 206 200 204 227   Blood Culture    Component Value Date/Time   SDES URINE, RANDOM 04/24/2018 1555   SPECREQUEST NONE 04/24/2018 1555   CULT (A) 04/24/2018 1555    40,000 COLONIES/mL LACTOBACILLUS SPECIES Standardized susceptibility testing for this organism is not available. Performed at [facility], [place] [address], [place], [place] [address]    REPTSTATUS 04/26/2018 FINAL 04/24/2018 1555    Cardiac Enzymes: No results for input(s): CKTOTAL, CKMB, CKMBINDEX,  TROPONINI in the last 168 hours. CBG: Recent Labs  Lab 10/14/19 1147 10/14/19 1204 10/14/19 1625 10/14/19 2120 10/15/19 0746  GLUCAP 68* 94 180* 348* 232*   Iron Studies: No results for input(s): IRON, TIBC, TRANSFERRIN, FERRITIN in the last 72 hours. @lablastinr3 @ Studies/Results: No results found. Medications: . cefTRIAXone (ROCEPHIN)  IV 2 g (10/14/19 1729)   And  . metronidazole 500 mg (10/15/19 0959)  . dialysis solution 1.5% low-MG/low-CA    . dialysis solution 2.5% low-MG/low-CA     . aspirin EC  81 mg Oral Daily  . atorvastatin  10 mg Oral Daily  . calcitRIOL  0.25 mcg Oral Daily  . carvedilol  25 mg Oral BID WC  . darbepoetin (ARANESP) injection - NON-DIALYSIS  100 mcg Subcutaneous Q Fri-1800  . gabapentin  100 mg Oral QHS  . gentamicin cream  1 application Topical Daily  . heparin  5,000 Units Subcutaneous Q8H  . hydrALAZINE  100 mg Oral BID  . insulin aspart  0-6 Units Subcutaneous TID WC  . isosorbide mononitrate  15 mg Oral Daily  . lisinopril  40 mg Oral Daily  . sevelamer carbonate  1,600 mg Oral TID  WC  . spironolactone  25 mg Oral Daily     OutptPD:5 exchanges, 2400 fill vol, 1.5 hr dwells, edw 68.5kg -mircera 150 ug on 12/17  Problem/Plan: 1. Acute sigmoid diverticulitis - per primary team, on IV abx ceftriaxone, IV flagyl. Has been advanced to solid food. Noted GI saw and sign off reccommended surg see as op  2. ESRD - on PD x 1 yr. Continue CCPD on schedule. Using 1.5% and 2.5% solution.   3. Hypokalemia - K 3.1 today.  Start K DUR 20 MEQ daily, Check RFP in AM.  4. Volume/ BP - BP's appear okay. On mult BP meds at home, can use these here as needed. / states using Furosemide only prn at home will dc here ( PD uf as needed )?? bed wts which are up again today. Plan to use all 2.5% tonight again tonight. Volume seems OK. Needs standing wts.  5. H/O CHF-appears euvolemic by exam.  6. Anemia ckd - HGB 8.2 today. Rec'd Aranesp 100 mcg SQ 10/12/18.  Follow HGB.  7. MBD- fu ca Maren Reamer  On Po calcitriol / no binder currently   [employer] NP-C 10/15/2019, 11:42 AM  [facility] [phone]  Pt seen, examined and agree w A/P as above.  Kelly Splinter  MD 10/15/2019, 12:11 PM

## 2019-10-15 NOTE — Progress Notes (Signed)
PROGRESS NOTE  Patricia [name] QMG:[id number] DOB: Sep 26, 1956 DOA: 10/10/2019 PCP: Ladell Pier, MD  Brief History   64 year old female with past medical history of ESRD on peritoneal dialysis, chronic combined systolic/diastolic CHF and diabetes type 2 on insulin came to the emergency room on 12/29 with several days of left lower quadrant abdominal pain and admitted for sigmoid diverticulitis.  Patient has been put on clear liquids and IV antibiotics. No signs of abscess noted. Patient seems to be feeling a little bit better, still some left lower quadrant abdominal pain. Nephrology has been consulted and patient continues to get peritoneal dialysis without incident.  Consultants  . Nephrology  Antibiotics   Anti-infectives (From admission, onward)   Start     Dose/Rate Route Frequency Ordered Stop   10/11/19 1800  cefTRIAXone (ROCEPHIN) 2 g in sodium chloride 0.9 % 100 mL IVPB     2 g 200 mL/hr over 30 Minutes Intravenous Every 24 hours 10/10/19 2006     10/11/19 0200  metroNIDAZOLE (FLAGYL) IVPB 500 mg     500 mg 100 mL/hr over 60 Minutes Intravenous Every 8 hours 10/10/19 2006     10/10/19 1815  cefTRIAXone (ROCEPHIN) 2 g in sodium chloride 0.9 % 100 mL IVPB     2 g 200 mL/hr over 30 Minutes Intravenous  Once 10/10/19 1809 10/10/19 2024   10/10/19 1815  metroNIDAZOLE (FLAGYL) IVPB 500 mg     500 mg 100 mL/hr over 60 Minutes Intravenous  Once 10/10/19 1809 10/10/19 2024     Subjective  The patient is resting comfortably. No new complaints. She is tolerating a regular diet well.  Objective   Vitals:  Vitals:   10/15/19 1418 10/15/19 1517  BP: 139/65 (!) 124/59  Pulse: 77 75  Resp: 18 17  Temp: 98.2 F (36.8 C) 98.1 F (36.7 C)  SpO2: 97% 95%    Exam:  Constitutional:  . The patient is awake, alert, and oriented x 3. No acute distress. Respiratory:  . No increased work of breathing. . No wheezes, rales, or rhonchi . No tactile fremitus Cardiovascular:   . Regular rate and rhythm . No murmurs, ectopy, or gallups. . No lateral PMI. No thrills. Abdomen:  . Abdomen is soft, non-tender, non-distended . No hernias, masses, or organomegaly . Normoactive bowel sounds.  Musculoskeletal:  . No cyanosis, clubbing, or edema Skin:  . No rashes, lesions, ulcers . palpation of skin: no induration or nodules Neurologic:  . CN 2-12 intact . Sensation all 4 extremities intact Psychiatric:  . Mental status o Mood, affect appropriate o Orientation to person, place, time  . judgment and insight appear intact  I have personally reviewed the following:   Today's Data  . Vitals, BMP  Scheduled Meds: . aspirin EC  81 mg Oral Daily  . atorvastatin  10 mg Oral Daily  . calcitRIOL  0.25 mcg Oral Daily  . carvedilol  25 mg Oral BID WC  . darbepoetin (ARANESP) injection - NON-DIALYSIS  100 mcg Subcutaneous Q Fri-1800  . gabapentin  100 mg Oral QHS  . gentamicin cream  1 application Topical Daily  . heparin  5,000 Units Subcutaneous Q8H  . hydrALAZINE  100 mg Oral BID  . insulin aspart  0-6 Units Subcutaneous TID WC  . isosorbide mononitrate  15 mg Oral Daily  . lisinopril  40 mg Oral Daily  . sevelamer carbonate  1,600 mg Oral TID WC  . spironolactone  25 mg Oral Daily  Continuous Infusions: . cefTRIAXone (ROCEPHIN)  IV 2 g (10/14/19 1729)   And  . metronidazole 500 mg (10/15/19 0959)  . dialysis solution 1.5% low-MG/low-CA    . dialysis solution 2.5% low-MG/low-CA      Principal Problem:   Sigmoid diverticulitis Active Problems:   Hypertension associated with diabetes ([place])   Diabetes mellitus type 2 with complications (HCC)   Chronic combined systolic and diastolic CHF (congestive heart failure) (HCC)   Periumbilical hernia   ESRD on peritoneal dialysis ([place])   LOS: 4 days  Proximal sigmoid diverticulitis With no definite abscess noted at this time.  Pigtail drainage catheter seen in the anterior portion of the pelvis.  Continued  presence of moderate size periumbilical hernia which contains loops of small bowel, but does not result in incarceration or obstruction. Still complains of nausea and abdominal pain this morning. Continue IV antibiotics and IV antiemetics as needed Continue pain management Currently on a diabetic diet. Consider discharge to home tomorrow if she continues to do well.   ESRD on peritoneal dialysis Continue PD No acute issues Nephrology following  Hypervolemic hyponatremia/hypokalemia Volume managed by hemodialysis Electrolytes managed by hemodialysis Continue daily renal panel Nephrology following  Anemia of chronic disease Hemoglobin is stable No overt bleeding  Moderate periumbilical hernia Contains loops of small bowel but does not result in incarceration or obstruction per CT scan abdomen and pelvis without contrast done on 10/10/2019.  Chronic combined systolic and diastolic CHF Last 2D echo done on 12/05/2018 showed LVEF 40 to 45% with evidence of diastolic dysfunction Volume status managed by hemodialysis Continue cardiac medications  Type 2 diabetes with hyperglycemia Last hemoglobin A1c 7.0 on 06/16/19. Repeat A1c Continue insulin coverage  Essential hypertension Continue home medications Blood pressure is at goal.  I have seen and examined this patient myself. I have spent 34 minutes in her evaluation and care.  DVT Prophylaxis: Subcutaneous heparin Code Status:Full code Family Communication:none available Disposition Plan:Home hopefully in the next few days once able to tolerate solid food. Will go home with home health PT.

## 2019-10-16 LAB — CBC WITH DIFFERENTIAL/PLATELET
Abs Immature Granulocytes: 0.16 10*3/uL — ABNORMAL HIGH (ref 0.00–0.07)
Basophils Absolute: 0.1 10*3/uL (ref 0.0–0.1)
Basophils Relative: 1 %
Eosinophils Absolute: 0.2 10*3/uL (ref 0.0–0.5)
Eosinophils Relative: 2 %
HCT: 26.6 % — ABNORMAL LOW (ref 36.0–46.0)
Hemoglobin: 8.2 g/dL — ABNORMAL LOW (ref 12.0–15.0)
Immature Granulocytes: 2 %
Lymphocytes Relative: 19 %
Lymphs Abs: 1.6 10*3/uL (ref 0.7–4.0)
MCH: 30.7 pg (ref 26.0–34.0)
MCHC: 30.8 g/dL (ref 30.0–36.0)
MCV: 99.6 fL (ref 80.0–100.0)
Monocytes Absolute: 0.6 10*3/uL (ref 0.1–1.0)
Monocytes Relative: 7 %
Neutro Abs: 5.9 10*3/uL (ref 1.7–7.7)
Neutrophils Relative %: 69 %
Platelets: 232 10*3/uL (ref 150–400)
RBC: 2.67 MIL/uL — ABNORMAL LOW (ref 3.87–5.11)
RDW: 14.3 % (ref 11.5–15.5)
WBC: 8.5 10*3/uL (ref 4.0–10.5)
nRBC: 0 % (ref 0.0–0.2)

## 2019-10-16 LAB — GLUCOSE, CAPILLARY
Glucose-Capillary: 336 mg/dL — ABNORMAL HIGH (ref 70–99)
Glucose-Capillary: 239 mg/dL — ABNORMAL HIGH (ref 70–99)
Glucose-Capillary: 163 mg/dL — ABNORMAL HIGH (ref 70–99)
Glucose-Capillary: 323 mg/dL — ABNORMAL HIGH (ref 70–99)

## 2019-10-16 LAB — BASIC METABOLIC PANEL
Anion gap: 12 (ref 5–15)
BUN: 18 mg/dL (ref 8–23)
CO2: 27 mmol/L (ref 22–32)
Calcium: 8.1 mg/dL — ABNORMAL LOW (ref 8.9–10.3)
Chloride: 92 mmol/L — ABNORMAL LOW (ref 98–111)
Creatinine, Ser: 6.09 mg/dL — ABNORMAL HIGH (ref 0.44–1.00)
GFR calc Af Amer: 8 mL/min — ABNORMAL LOW (ref >60)
GFR calc non Af Amer: 7 mL/min — ABNORMAL LOW (ref >60)
Glucose, Bld: 411 mg/dL — ABNORMAL HIGH (ref 70–99)
Potassium: 3.7 mmol/L (ref 3.5–5.1)
Sodium: 131 mmol/L — ABNORMAL LOW (ref 135–145)

## 2019-10-16 MED ORDER — INSULIN ASPART 100 UNIT/ML ~~LOC~~ SOLN
4.0000 [IU] | Freq: Once | SUBCUTANEOUS | Status: AC
  Administered 2019-10-16: 4 [IU] via SUBCUTANEOUS

## 2019-10-16 NOTE — Progress Notes (Signed)
PROGRESS NOTE  Patricia [name] ELF:[id number] DOB: 1956/09/05 DOA: 10/10/2019 PCP: Ladell Pier, MD  Brief History   64 year old female with past medical history of ESRD on peritoneal dialysis, chronic combined systolic/diastolic CHF and diabetes type 2 on insulin came to the emergency room on 12/29 with several days of left lower quadrant abdominal pain and admitted for sigmoid diverticulitis.  Patient has been put on clear liquids and IV antibiotics. No signs of abscess noted. Patient seems to be feeling a little bit better, still some left lower quadrant abdominal pain. Nephrology has been consulted and patient continues to get peritoneal dialysis without incident.  The patient has tolerated diabetic diet. Awaiting eval by PT/for disposition recommendations.  Consultants  . Nephrology  Antibiotics   Anti-infectives (From admission, onward)   Start     Dose/Rate Route Frequency Ordered Stop   10/11/19 1800  cefTRIAXone (ROCEPHIN) 2 g in sodium chloride 0.9 % 100 mL IVPB     2 g 200 mL/hr over 30 Minutes Intravenous Every 24 hours 10/10/19 2006     10/11/19 0200  metroNIDAZOLE (FLAGYL) IVPB 500 mg     500 mg 100 mL/hr over 60 Minutes Intravenous Every 8 hours 10/10/19 2006     10/10/19 1815  cefTRIAXone (ROCEPHIN) 2 g in sodium chloride 0.9 % 100 mL IVPB     2 g 200 mL/hr over 30 Minutes Intravenous  Once 10/10/19 1809 10/10/19 2024   10/10/19 1815  metroNIDAZOLE (FLAGYL) IVPB 500 mg     500 mg 100 mL/hr over 60 Minutes Intravenous  Once 10/10/19 1809 10/10/19 2024     Subjective  The patient is resting comfortably. No new complaints. She is tolerating a regular diet well.  Objective   Vitals:  Vitals:   10/16/19 0900 10/16/19 1348  BP: 116/72 (!) 162/75  Pulse: 85 86  Resp: 16 18  Temp: 98.6 F (37 C) 98.5 F (36.9 C)  SpO2: 98% 94%    Exam:  Constitutional:  . The patient is awake, alert, and oriented x 3. No acute distress. Respiratory:  . No  increased work of breathing. . No wheezes, rales, or rhonchi . No tactile fremitus Cardiovascular:  . Regular rate and rhythm . No murmurs, ectopy, or gallups. . No lateral PMI. No thrills. Abdomen:  . Abdomen is soft, non-tender, non-distended . No hernias, masses, or organomegaly . Normoactive bowel sounds.  Musculoskeletal:  . No cyanosis, clubbing, or edema Skin:  . No rashes, lesions, ulcers . palpation of skin: no induration or nodules Neurologic:  . CN 2-12 intact . Sensation all 4 extremities intact Psychiatric:  . Mental status o Mood, affect appropriate o Orientation to person, place, time  . judgment and insight appear intact  I have personally reviewed the following:   Today's Data  . Vitals, BMP  Scheduled Meds: . aspirin EC  81 mg Oral Daily  . atorvastatin  10 mg Oral Daily  . calcitRIOL  0.25 mcg Oral Daily  . carvedilol  25 mg Oral BID WC  . darbepoetin (ARANESP) injection - NON-DIALYSIS  100 mcg Subcutaneous Q Fri-1800  . gabapentin  100 mg Oral QHS  . gentamicin cream  1 application Topical Daily  . heparin  5,000 Units Subcutaneous Q8H  . hydrALAZINE  100 mg Oral BID  . insulin aspart  0-6 Units Subcutaneous TID WC  . isosorbide mononitrate  15 mg Oral Daily  . lisinopril  40 mg Oral Daily  . sevelamer carbonate  1,600 mg  Oral TID WC  . spironolactone  25 mg Oral Daily   Continuous Infusions: . cefTRIAXone (ROCEPHIN)  IV Stopped (10/15/19 1920)   And  . metronidazole 500 mg (10/16/19 0958)  . dialysis solution 1.5% low-MG/low-CA    . dialysis solution 2.5% low-MG/low-CA      Principal Problem:   Sigmoid diverticulitis Active Problems:   Hypertension associated with diabetes ([place])   Diabetes mellitus type 2 with complications (HCC)   Chronic combined systolic and diastolic CHF (congestive heart failure) (HCC)   Periumbilical hernia   ESRD on peritoneal dialysis ([place])   LOS: 5 days  Proximal sigmoid diverticulitis With no definite  abscess noted at this time.  Pigtail drainage catheter seen in the anterior portion of the pelvis.  Continued presence of moderate size periumbilical hernia which contains loops of small bowel, but does not result in incarceration or obstruction. Still complains of nausea and abdominal pain this morning. Continue IV antibiotics and IV antiemetics as needed Continue pain management Currently on a diabetic diet.  ESRD on peritoneal dialysis Continue PD No acute issues Nephrology following  Hypervolemic hyponatremia/hypokalemia Volume managed by hemodialysis Electrolytes managed by hemodialysis Continue daily renal panel Nephrology following  Anemia of chronic disease Hemoglobin is stable No overt bleeding  Moderate periumbilical hernia Contains loops of small bowel but does not result in incarceration or obstruction per CT scan abdomen and pelvis without contrast done on 10/10/2019.  Chronic combined systolic and diastolic CHF Last 2D echo done on 12/05/2018 showed LVEF 40 to 45% with evidence of diastolic dysfunction Volume status managed by hemodialysis Continue cardiac medications  Type 2 diabetes with hyperglycemia Last hemoglobin A1c 7.0 on 06/16/19. Repeat A1c Continue insulin coverage  Essential hypertension Continue home medications Blood pressure is at goal.  I have seen and examined this patient myself. I have spent 34 minutes in her evaluation and care.  DVT Prophylaxis: Subcutaneous heparin Code Status:Full code Family Communication:none available Disposition Plan:Awaiting eval by PT/OT.

## 2019-10-16 NOTE — Progress Notes (Signed)
CBG 323 no HS coverage [facility] notified. Arthor Captain LPN

## 2019-10-16 NOTE — Care Management Important Message (Signed)
Important Message  Patient Details  Name: Patricia [name] MRN: [id number] Date of Birth: 11-26-1955   Medicare Important Message Given:  Yes     Memory Argue 10/16/2019, 3:24 PM

## 2019-10-16 NOTE — Progress Notes (Addendum)
Subjective: drinking coffee , some Left lower abd discomfort but better , tolerates CCPD  Without pain  Objective Vital signs in last 24 hours: Vitals:   10/15/19 1418 10/15/19 1517 10/15/19 2050 10/16/19 0427  BP: 139/65 (!) 124/59 (!) 157/67 119/70  Pulse: 77 75 79 85  Resp: 18 17 18 18   Temp: 98.2 F (36.8 C) 98.1 F (36.7 C) 98.5 F (36.9 C) 98.4 F (36.9 C)  TempSrc: Oral Oral Oral Oral  SpO2: 97% 95% 96% 97%  Weight:    76.5 kg   Weight change: 3.743 kg  Physical Exam: General:  Alert Pleasant older female in NAD Heart: RRR, no m, r,.g Lungs: CTA  Abdomen: Soft  NT PD cath RLQ drsg intact Extremities: no LE edema.  Dialysis Access: PD cath as noted above  OutptPD:5 exchanges, 2400 fill vol, 1.5 hr dwells, edw 68.5kg -mircera 150 ug on 12/17  Problem/Plan: 1. Acute sigmoid diverticulitis - per primary team, on IV abx and tolerating some solid diet   /Noted GI saw and sign off reccommended surg see as op  2. ESRD - on PD x 1 yr. CCPD  Tolerating in  [place]. / using 1.25 and 2.5 % solution  3. Hypokalemia - K 3.7 on    replacement with po K  /  4. Volume/ BP - BP's appear okay. On mult BP meds at home, can use these here as needed. / states using Furosemide only prn at home /have dc here ( PD uf as needed )?? bed wts UP , use all 2.5% tonight bp ok and no sig vol on exam  5. H/o CHF- no volume on exam  6. Anemia ckd - last esa was on 12/17,  Hb is 8.2 today .Given Aranesp 100 Sq  10/13/19 7. MBD- fu ca Maren Reamer  On Po calcitriol / no binder currently   Ernest Haber, PA-C [facility] Beeper [phone] 10/16/2019,9:03 AM  LOS: 5 days   Labs: Basic Metabolic Panel: Recent Labs  Lab 10/14/19 1603 10/15/19 0138 10/16/19 0611  NA 131* 130* 131*  K 3.2* 3.1* 3.7  CL 92* 92* 92*  CO2 28 25 27   GLUCOSE 204* 386* 411*  BUN 24* 19 18  CREATININE 6.63* 6.01* 6.09*  CALCIUM 8.0* 7.8* 8.1*  PHOS  --  4.2  4.3  --    Liver Function Tests: Recent  Labs  Lab 10/10/19 1418 10/14/19 0307 10/15/19 0138  AST 30 15  --   ALT 18 11  --   ALKPHOS 55 50  --   BILITOT 1.1 0.2*  --   PROT 6.6 5.3*  --   ALBUMIN 2.4* 1.8* 1.6*   Recent Labs  Lab 10/10/19 1418  LIPASE 33   No results for input(s): AMMONIA in the last 168 hours. CBC: Recent Labs  Lab 10/12/19 0405 10/13/19 0259 10/14/19 0307 10/15/19 0138 10/16/19 0611  WBC 9.0 7.8 7.9 8.0 8.5  NEUTROABS 6.3 5.5 6.0 5.5 5.9  HGB 8.4* 8.4* 8.4* 8.2* 8.2*  HCT 25.9* 26.4* 26.0* 25.2* 26.6*  MCV 96.3 96.4 94.9 95.8 99.6  PLT 206 200 204 227 232   Cardiac Enzymes: No results for input(s): CKTOTAL, CKMB, CKMBINDEX, TROPONINI in the last 168 hours. CBG: Recent Labs  Lab 10/15/19 0746 10/15/19 1136 10/15/19 1742 10/15/19 2047 10/16/19 0725  GLUCAP 232* 141* 192* 309* 336*    Studies/Results: No results found. Medications: . cefTRIAXone (ROCEPHIN)  IV Stopped (10/15/19 1920)   And  . metronidazole Stopped (  10/16/19 0241)  . dialysis solution 1.5% low-MG/low-CA    . dialysis solution 2.5% low-MG/low-CA     . aspirin EC  81 mg Oral Daily  . atorvastatin  10 mg Oral Daily  . calcitRIOL  0.25 mcg Oral Daily  . carvedilol  25 mg Oral BID WC  . darbepoetin (ARANESP) injection - NON-DIALYSIS  100 mcg Subcutaneous Q Fri-1800  . gabapentin  100 mg Oral QHS  . gentamicin cream  1 application Topical Daily  . heparin  5,000 Units Subcutaneous Q8H  . hydrALAZINE  100 mg Oral BID  . insulin aspart  0-6 Units Subcutaneous TID WC  . isosorbide mononitrate  15 mg Oral Daily  . lisinopril  40 mg Oral Daily  . sevelamer carbonate  1,600 mg Oral TID WC  . spironolactone  25 mg Oral Daily   Nephrology attending: Patient was seen and examined at bedside.  Chart reviewed.  I agree with assessment and plan as outlined above.  64 year old female ESRD on PD admitted with acute sigmoid diverticulitis.  Currently on IV ceftriaxone and Flagyl.  Doing well on PD.  Continue current  prescription.  Continue ESA, binders and vitamin D.  Receiving potassium chloride for hypokalemia.  Lawson Radar, MD [facility].

## 2019-10-16 NOTE — Plan of Care (Signed)

## 2019-10-16 NOTE — Progress Notes (Signed)
Inpatient Diabetes Program Recommendations  AACE/ADA: New Consensus Statement on Inpatient Glycemic Control   Target Ranges:  Prepandial:   less than 140 mg/dL      Peak postprandial:   less than 180 mg/dL (1-2 hours)      Critically ill patients:  140 - 180 mg/dL   Results for Patricia [name], Patricia [name] (MRN [id number]) as of 10/16/2019 10:47  Ref. Range 10/15/2019 07:46 10/15/2019 11:36 10/15/2019 17:42 10/15/2019 20:47 10/16/2019 07:25  Glucose-Capillary Latest Ref Range: 70 - 99 mg/dL 232 (H) 141 (H) 192 (H) 309 (H) 336 (H)   Review of Glycemic Control  Diabetes history: DM2 Outpatient Diabetes medications: Lantus 4 units QHS, Novolog 3 units TID with meals, Glipizide 10 mg BID Current orders for Inpatient glycemic control: Novolog 0-6 units TID with meals  Inpatient Diabetes Program Recommendations:   Insulin - Basal: Please consider order Lantus 3 units Q24H.  Correction (SSI): Please consider ordering Novolog 0-5 units QHS for bedtime correction.  Thanks, Barnie Alderman, RN, MSN, CDE Diabetes Coordinator Inpatient Diabetes Program [phone] (Team Pager from 8am to 5pm)

## 2019-10-16 NOTE — Progress Notes (Signed)
Physical Therapy Treatment Patient Details Name: Patricia [name] MRN: [id number] DOB: 1956/03/06 Today's Date: 10/16/2019    History of Present Illness Pt is 64 year old female with past medical history of ESRD on peritoneal dialysis, chronic combined systolic/diastolic CHF and diabetes type 2 on insulin came to the emergency room on 12/29 with several days of left lower quadrant abdominal pain and admitted for sigmoid diverticulitis currently being managed with antibiotics and clear liquids.    PT Comments    Continuing work on functional mobility and activity tolerance;  Agreeing to ambulate in the hallway today; noting less steady than last session, and opted to sit and toll back to her room for safety; Will continue to recommend dc home with [facility] therapy follow up -- would like for her to be able to be more independent as her daughter works nights; will place OT order per protocol for ADLs  Follow Up Recommendations  Home health PT;Supervision for mobility/OOB     Equipment Recommendations  None recommended by PT    Recommendations for Other Services       Precautions / Restrictions Precautions Precautions: Fall    Mobility  Bed Mobility Overal bed mobility: Needs Assistance Bed Mobility: Supine to Sit     Supine to sit: Min guard;HOB elevated     General bed mobility comments: increased time, use of bed rails  Transfers Overall transfer level: Needs assistance Equipment used: Rolling walker (2 wheeled) Transfers: Sit to/from Stand Sit to Stand: Min assist         General transfer comment: increased time; cues for safe hand placement; min assist to steady  Ambulation/Gait Ambulation/Gait assistance: Min assist Gait Distance (Feet): 35 Feet Assistive device: Rolling walker (2 wheeled) Gait Pattern/deviations: Step-through pattern;Decreased step length - right;Decreased step length - left;Decreased stride length     General Gait Details: Slow moving, and noting  mild to moderate unsteadiness with amb; cues to self-monitor for activity tolerance; opted to sit earlier than anticipated because of lightheadedness   Stairs             Wheelchair Mobility    Modified Rankin (Stroke Patients Only)       Balance     Sitting balance-Leahy Scale: Good       Standing balance-Leahy Scale: Fair                              Cognition Arousal/Alertness: Awake/alert Behavior During Therapy: WFL for tasks assessed/performed Overall Cognitive Status: Within Functional Limits for tasks assessed                                        Exercises      General Comments        Pertinent Vitals/Pain Pain Assessment: Faces Faces Pain Scale: Hurts a little bit Pain Location: L Lower quadrant of abdomen Pain Descriptors / Indicators: Cramping Pain Intervention(s): Monitored during session    Home Living                      Prior Function            PT Goals (current goals can now be found in the care plan section) Acute Rehab PT Goals Patient Stated Goal: return to daughter's home at d/c PT Goal Formulation: With patient/family Time For Goal Achievement: 10/27/19 Potential to  Achieve Goals: Good Progress towards PT goals: Progressing toward goals(Slowly; quite fatigued today)    Frequency    Min 3X/week      PT Plan Current plan remains appropriate    Co-evaluation              AM-PAC PT "6 Clicks" Mobility   Outcome Measure  Help needed turning from your back to your side while in a flat bed without using bedrails?: None Help needed moving from lying on your back to sitting on the side of a flat bed without using bedrails?: A Little Help needed moving to and from a bed to a chair (including a wheelchair)?: A Little Help needed standing up from a chair using your arms (e.g., wheelchair or bedside chair)?: A Little Help needed to walk in hospital room?: A Little Help needed  climbing 3-5 steps with a railing? : A Lot 6 Click Score: 18    End of Session Equipment Utilized During Treatment: Gait belt Activity Tolerance: Patient tolerated treatment well Patient left: in chair;with call bell/phone within reach Nurse Communication: Mobility status;Other (comment)(Requesting wash up) PT Visit Diagnosis: Other abnormalities of gait and mobility (R26.89)     Time: 8757-9728 PT Time Calculation (min) (ACUTE ONLY): 19 min  Charges:  $Gait Training: 8-22 mins                     Roney Marion, PT  [place] Pager [phone] Office [phone]    Colletta [place] 10/16/2019, 10:13 AM

## 2019-10-17 LAB — BASIC METABOLIC PANEL
Anion gap: 11 (ref 5–15)
Anion gap: 11 (ref 5–15)
BUN: 15 mg/dL (ref 8–23)
BUN: 15 mg/dL (ref 8–23)
CO2: 26 mmol/L (ref 22–32)
CO2: 26 mmol/L (ref 22–32)
Calcium: 8.4 mg/dL — ABNORMAL LOW (ref 8.9–10.3)
Calcium: 8.3 mg/dL — ABNORMAL LOW (ref 8.9–10.3)
Chloride: 96 mmol/L — ABNORMAL LOW (ref 98–111)
Chloride: 96 mmol/L — ABNORMAL LOW (ref 98–111)
Creatinine, Ser: 5.84 mg/dL — ABNORMAL HIGH (ref 0.44–1.00)
Creatinine, Ser: 6.4 mg/dL — ABNORMAL HIGH (ref 0.44–1.00)
GFR calc Af Amer: 8 mL/min — ABNORMAL LOW (ref >60)
GFR calc Af Amer: 7 mL/min — ABNORMAL LOW (ref >60)
GFR calc non Af Amer: 7 mL/min — ABNORMAL LOW (ref >60)
GFR calc non Af Amer: 6 mL/min — ABNORMAL LOW (ref >60)
Glucose, Bld: 350 mg/dL — ABNORMAL HIGH (ref 70–99)
Glucose, Bld: 176 mg/dL — ABNORMAL HIGH (ref 70–99)
Potassium: 3 mmol/L — ABNORMAL LOW (ref 3.5–5.1)
Potassium: 4.1 mmol/L (ref 3.5–5.1)
Sodium: 133 mmol/L — ABNORMAL LOW (ref 135–145)
Sodium: 133 mmol/L — ABNORMAL LOW (ref 135–145)

## 2019-10-17 LAB — GLUCOSE, CAPILLARY
Glucose-Capillary: 249 mg/dL — ABNORMAL HIGH (ref 70–99)
Glucose-Capillary: 238 mg/dL — ABNORMAL HIGH (ref 70–99)
Glucose-Capillary: 154 mg/dL — ABNORMAL HIGH (ref 70–99)
Glucose-Capillary: 403 mg/dL — ABNORMAL HIGH (ref 70–99)

## 2019-10-17 LAB — CBC WITH DIFFERENTIAL/PLATELET
Abs Immature Granulocytes: 0.14 10*3/uL — ABNORMAL HIGH (ref 0.00–0.07)
Basophils Absolute: 0 10*3/uL (ref 0.0–0.1)
Basophils Relative: 0 %
Eosinophils Absolute: 0.2 10*3/uL (ref 0.0–0.5)
Eosinophils Relative: 2 %
HCT: 26.5 % — ABNORMAL LOW (ref 36.0–46.0)
Hemoglobin: 8.2 g/dL — ABNORMAL LOW (ref 12.0–15.0)
Immature Granulocytes: 2 %
Lymphocytes Relative: 22 %
Lymphs Abs: 2.1 10*3/uL (ref 0.7–4.0)
MCH: 31.1 pg (ref 26.0–34.0)
MCHC: 30.9 g/dL (ref 30.0–36.0)
MCV: 100.4 fL — ABNORMAL HIGH (ref 80.0–100.0)
Monocytes Absolute: 0.6 10*3/uL (ref 0.1–1.0)
Monocytes Relative: 6 %
Neutro Abs: 6.4 10*3/uL (ref 1.7–7.7)
Neutrophils Relative %: 68 %
Platelets: 261 10*3/uL (ref 150–400)
RBC: 2.64 MIL/uL — ABNORMAL LOW (ref 3.87–5.11)
RDW: 14.7 % (ref 11.5–15.5)
WBC: 9.5 10*3/uL (ref 4.0–10.5)
nRBC: 0 % (ref 0.0–0.2)

## 2019-10-17 MED ORDER — POTASSIUM CHLORIDE CRYS ER 20 MEQ PO TBCR: 40.0000 meq | EXTENDED_RELEASE_TABLET | Freq: Once | ORAL | Status: DC

## 2019-10-17 MED ORDER — INSULIN ASPART 100 UNIT/ML ~~LOC~~ SOLN
0.0000 [IU] | Freq: Every day | SUBCUTANEOUS | Status: DC
  Administered 2019-10-17: 5 [IU] via SUBCUTANEOUS

## 2019-10-17 MED ORDER — POTASSIUM CHLORIDE CRYS ER 20 MEQ PO TBCR
40.0000 meq | EXTENDED_RELEASE_TABLET | Freq: Once | ORAL | Status: AC
  Administered 2019-10-17: 40 meq via ORAL
  Filled 2019-10-17: qty 2

## 2019-10-17 MED ORDER — INSULIN ASPART 100 UNIT/ML ~~LOC~~ SOLN
0.0000 [IU] | Freq: Three times a day (TID) | SUBCUTANEOUS | Status: DC
  Administered 2019-10-17: 5 [IU] via SUBCUTANEOUS
  Administered 2019-10-17: 3 [IU] via SUBCUTANEOUS
  Administered 2019-10-18: 08:00:00 5 [IU] via SUBCUTANEOUS

## 2019-10-17 MED ORDER — INSULIN GLARGINE 100 UNIT/ML ~~LOC~~ SOLN
3.0000 [IU] | Freq: Every day | SUBCUTANEOUS | Status: DC
  Administered 2019-10-17: 3 [IU] via SUBCUTANEOUS
  Filled 2019-10-17 (×3): qty 0.03

## 2019-10-17 MED ORDER — POTASSIUM CHLORIDE CRYS ER 20 MEQ PO TBCR: 20.0000 meq | EXTENDED_RELEASE_TABLET | Freq: Every day | ORAL | Status: DC

## 2019-10-17 NOTE — Evaluation (Signed)
Occupational Therapy Evaluation Patient Details Name: Patricia [name] MRN: [id number] DOB: 09-26-1956 Today's Date: 10/17/2019    History of Present Illness Pt is 64 year old female with past medical history of ESRD on peritoneal dialysis, chronic combined systolic/diastolic CHF and diabetes type 2 on insulin came to the emergency room on 12/29 with several days of left lower quadrant abdominal pain and admitted for sigmoid diverticulitis currently being managed with antibiotics and clear liquids.   Clinical Impression   Pt with decline in function and safety with ADLs and ADL mobility with impaired strength, balance and endurance. PTA, pt lives at home with her daughter and was independent with ADLs/selfcare, home mgt and mobility (has cane but doesn't use). Pt currently requires mod A with LB ADLs, min A with toileting and min A with functional mobility using RW. Pt would benefit from acute OT services to address impairments to maximize level of function and safety    Follow Up Recommendations  Home health OT    Equipment Recommendations  Other (comment)(reacher, LH bath sponge)    Recommendations for Other Services       Precautions / Restrictions Precautions Precautions: Fall Restrictions Weight Bearing Restrictions: No      Mobility Bed Mobility Overal bed mobility: Needs Assistance Bed Mobility: Supine to Sit     Supine to sit: Min guard;HOB elevated Sit to supine: Min guard;HOB elevated   General bed mobility comments: increased time, use of bed rails  Transfers Overall transfer level: Needs assistance Equipment used: Rolling walker (2 wheeled) Transfers: Sit to/from Stand Sit to Stand: Min assist         General transfer comment: increased time; cues for safe hand placement; min assist to steady    Balance Overall balance assessment: Needs assistance Sitting-balance support: No upper extremity supported;Feet supported Sitting balance-Leahy Scale: Good      Standing balance support: Bilateral upper extremity supported;During functional activity Standing balance-Leahy Scale: Fair                             ADL either performed or assessed with clinical judgement   ADL Overall ADL's : Needs assistance/impaired Eating/Feeding: Independent;Sitting   Grooming: Wash/dry hands;Wash/dry face;Set up;Supervision/safety;Sitting   Upper Body Bathing: Supervision/ safety;Set up;Sitting   Lower Body Bathing: Moderate assistance   Upper Body Dressing : Set up;Supervision/safety;Sitting   Lower Body Dressing: Moderate assistance   Toilet Transfer: Minimal assistance;Ambulation;RW;Stand-pivot;BSC;Cueing for safety   Toileting- Clothing Manipulation and Hygiene: Minimal assistance;Sit to/from stand       Functional mobility during ADLs: Minimal assistance;Cueing for safety;Rolling walker General ADL Comments: initiated education on ADL A/E fro LB selfcare at home with handout provided     Vision Baseline Vision/History: Wears glasses Wears Glasses: Reading only Patient Visual Report: No change from baseline       Perception     Praxis      Pertinent Vitals/Pain Pain Assessment: Faces Faces Pain Scale: Hurts a little bit Pain Location: L Lower quadrant of abdomen Pain Descriptors / Indicators: Cramping;Sore Pain Intervention(s): Monitored during session     Hand Dominance Right   Extremity/Trunk Assessment     Lower Extremity Assessment Lower Extremity Assessment: Defer to PT evaluation   Cervical / Trunk Assessment Cervical / Trunk Assessment: Normal   Communication Communication Communication: No difficulties   Cognition Arousal/Alertness: Awake/alert Behavior During Therapy: WFL for tasks assessed/performed Overall Cognitive Status: Within Functional Limits for tasks assessed  General Comments       Exercises     Shoulder Instructions      Home  Living Family/patient expects to be discharged to:: Private residence Living Arrangements: Children Available Help at Discharge: Family;Available PRN/intermittently Type of Home: Apartment Home Access: Level entry     Home Layout: One level     Bathroom Shower/Tub: Teacher, early years/pre: Standard     Home Equipment: Cane - single point;Shower seat          Prior Functioning/Environment Level of Independence: Needs assistance  Gait / Transfers Assistance Needed: Rarely uses SPC; ambulates short community distances regularly; community ambulation pushing grocery cart at times ADL's / Homemaking Assistance Needed: Typically able to do ADLs unless sick then has min A; does not drive   Comments: Daughter works at [employer] Problem List: Decreased strength;Impaired balance (sitting and/or standing);Pain;Decreased activity tolerance;Decreased knowledge of use of DME or AE      OT Treatment/Interventions: Self-care/ADL training;DME and/or AE instruction;Therapeutic activities;Patient/family education    OT Goals(Current goals can be found in the care plan section) Acute Rehab OT Goals Patient Stated Goal: return to daughter's home at d/c OT Goal Formulation: With patient Time For Goal Achievement: 10/31/19 Potential to Achieve Goals: Good ADL Goals Pt Will Perform Grooming: with min guard assist;with supervision;with set-up;standing Pt Will Perform Upper Body Bathing: with set-up;sitting Pt Will Perform Lower Body Bathing: with min assist;with adaptive equipment Pt Will Perform Upper Body Dressing: with set-up;sitting Pt Will Perform Lower Body Dressing: with min assist;with adaptive equipment Pt Will Transfer to Toilet: with min guard assist;with supervision;ambulating Pt Will Perform Toileting - Clothing Manipulation and hygiene: with min guard assist;with supervision;sit to/from stand  OT Frequency: Min 2X/week   Barriers to D/C:    no barriers        Co-evaluation              AM-PAC OT "6 Clicks" Daily Activity     Outcome Measure Help from another person eating meals?: None Help from another person taking care of personal grooming?: A Little Help from another person toileting, which includes using toliet, bedpan, or urinal?: A Little Help from another person bathing (including washing, rinsing, drying)?: A Lot Help from another person to put on and taking off regular upper body clothing?: A Little Help from another person to put on and taking off regular lower body clothing?: A Lot 6 Click Score: 17   End of Session Equipment Utilized During Treatment: Gait belt;Rolling walker;Other (comment)(BSC)  Activity Tolerance: Patient limited by fatigue Patient left: in bed;with call bell/phone within reach  OT Visit Diagnosis: Unsteadiness on feet (R26.81);Muscle weakness (generalized) (M62.81);Other abnormalities of gait and mobility (R26.89);Pain Pain - part of body: (abdomen)                Time: 6256-3893 OT Time Calculation (min): 28 min Charges:  OT General Charges $OT Visit: 1 Visit OT Evaluation $OT Eval Low Complexity: 1 Low OT Treatments $Self Care/Home Management : 8-22 mins    Emmit Alexanders [facility] 10/17/2019, 12:07 PM

## 2019-10-17 NOTE — Plan of Care (Signed)

## 2019-10-17 NOTE — Progress Notes (Signed)
Perio dialysis machine is beeping called HD RN she is unavailable to assist. Called rapid response RN is not able to assist. Paged [facility] to advise. Charge RN made aware of situation. Arthor Captain LPN

## 2019-10-17 NOTE — Progress Notes (Signed)
Inpatient Diabetes Program Recommendations  AACE/ADA: New Consensus Statement on Inpatient Glycemic Control   Target Ranges:  Prepandial:   less than 140 mg/dL      Peak postprandial:   less than 180 mg/dL (1-2 hours)      Critically ill patients:  140 - 180 mg/dL    Results for Patricia [name], Patricia [name] (MRN [id number]) as of 10/17/2019 11:02  Ref. Range 10/16/2019 07:25 10/16/2019 11:54 10/16/2019 16:52 10/16/2019 22:03 10/17/2019 07:32  Glucose-Capillary Latest Ref Range: 70 - 99 mg/dL 336 (H) 239 (H) 163 (H) 323 (H) 249 (H)   Review of Glycemic Control  Diabetes history: DM2 Outpatient Diabetes medications: Lantus 4 units QHS, Novolog 3 units TID with meals, Glipizide 10 mg BID Current orders for Inpatient glycemic control: Novolog 0-6 units TID with meals  Inpatient Diabetes Program Recommendations:   Insulin - Basal: Please consider order Lantus 3 units Q24H.  Correction (SSI): Please consider ordering Novolog 0-5 units QHS for bedtime correction.  Thanks, Barnie Alderman, RN, MSN, CDE Diabetes Coordinator Inpatient Diabetes Program [phone] (Team Pager from 8am to 5pm)

## 2019-10-17 NOTE — TOC Initial Note (Addendum)
Transition of Care [facility]) - Initial/Assessment Note    Patient Details  Name: Patricia [name] MRN: [id number] Date of Birth: 01-01-1956  [facility] CM/SW Contact:    Marilu Favre, RN Phone Number: 10/17/2019, 10:21 AM  Clinical Narrative:                 Patient from home with daughter. Daughter provides "every thing I need.". Patient agreeable to home health PT/OT. No preference.   NCM has made some calls to home health agencies awaiting call back   Hoyle Sauer [facility] referral for HHPT/OT .  Expected Discharge Plan: [place] Barriers to Discharge: Continued Medical Work up   Patient Goals and CMS Choice Patient states their goals for this hospitalization and ongoing recovery are:: to return to home CMS Medicare.gov Compare Post Acute Care list provided to:: Patient Choice offered to / list presented to : Patient  Expected Discharge Plan and Services Expected Discharge Plan: [place]   Discharge Planning Services: CM Consult Post Acute Care Choice: [place] arrangements for the past 2 months: Apartment                 DME Arranged: N/A         HH Arranged: OT, PT          Prior Living Arrangements/Services Living arrangements for the past 2 months: Apartment Lives with:: Adult Children Patient language and need for interpreter reviewed:: Yes Do you feel safe going back to the place where you live?: Yes      Need for Family Participation in Patient Care: Yes (Comment) Care giver support system in place?: Yes (comment) Current home services: DME Criminal Activity/Legal Involvement Pertinent to Current Situation/Hospitalization: No - Comment as needed  Activities of Daily Living Home Assistive Devices/Equipment: Eyeglasses, CBG Meter ADL Screening (condition at time of admission) Patient's cognitive ability adequate to safely complete daily activities?: Yes Is the patient  deaf or have difficulty hearing?: No Does the patient have difficulty seeing, even when wearing glasses/contacts?: No Does the patient have difficulty concentrating, remembering, or making decisions?: No Patient able to express need for assistance with ADLs?: Yes Does the patient have difficulty dressing or bathing?: No Independently performs ADLs?: Yes (appropriate for developmental age) Does the patient have difficulty walking or climbing stairs?: No Weakness of Legs: None Weakness of Arms/Hands: None  Permission Sought/Granted   Permission granted to share information with : No              Emotional Assessment Appearance:: Appears stated age Attitude/Demeanor/Rapport: Engaged Affect (typically observed): Accepting Orientation: : Oriented to Self, Oriented to Place, Oriented to  Time, Oriented to Situation Alcohol / Substance Use: Not Applicable Psych Involvement: No (comment)  Admission diagnosis:  Diverticulitis [E42.35] Umbilical hernia [T61.4] Sigmoid diverticulitis [K57.32] Left lower quadrant abdominal pain [R10.32] Patient Active Problem List   Diagnosis Date Noted  . Sigmoid diverticulitis 10/10/2019  . ESRD on peritoneal dialysis ([place]) 07/06/2019  . Periumbilical hernia 43/15/4008  . ESRD (end stage renal disease) ([place]) 01/20/2019  . Chronic combined systolic and diastolic CHF (congestive heart failure) ([place]) 12/06/2018  . Fluid overload 12/05/2018  . Type II diabetes mellitus with renal manifestations ([place]) 12/05/2018  . Hypoglycemia 12/05/2018  . Acute on chronic combined systolic and diastolic CHF (congestive heart failure) ([place]) 06/14/2018  . CKD (chronic kidney disease) stage 5, GFR less than 15 ml/min (HCC) 06/14/2018  .  Anemia due to stage 5 chronic kidney disease, not on chronic dialysis ([place]) 06/14/2018  . Systolic CHF ([place]) 47/18/5501  . Diabetes mellitus type 2 with complications ([place]) 58/68/2574  . Transaminasemia 11/29/2016  . Cardiomyopathy  ([place]) 11/12/2016  . Lipoma 10/21/2016  . CKD (chronic kidney disease) stage 4, GFR 15-29 ml/min (HCC) 08/10/2016  . Anxiety and depression 12/12/2015  . Diabetic nephropathy associated with type 2 diabetes mellitus ([place]) 11/15/2014  . Moderate nonproliferative diabetic retinopathy(362.05) 04/17/2014  . 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 Hypertension associated with diabetes ([place]) 03/28/2014  . Dental caries 03/28/2014  . Dyslipidemia 04/18/2013   PCP:  Ladell Pier, MD Pharmacy:   [facility] [address] [place]), [place] - [address] [place] [phone] Phone: [phone] Fax: [phone]     Social Determinants of Health (SDOH) Interventions    Readmission Risk Interventions No flowsheet data found.

## 2019-10-17 NOTE — Plan of Care (Signed)
  Problem: Clinical Measurements: Goal: Will remain free from infection Outcome: Progressing   Problem: Activity: Goal: Risk for activity intolerance will decrease Outcome: Progressing   Problem: Nutrition: Goal: Adequate nutrition will be maintained Outcome: Progressing   Problem: Elimination: Goal: Will not experience complications related to bowel motility Outcome: Progressing Goal: Will not experience complications related to urinary retention Outcome: Progressing   Problem: Safety: Goal: Ability to remain free from injury will improve Outcome: Progressing   Problem: Skin Integrity: Goal: Risk for impaired skin integrity will decrease Outcome: Progressing

## 2019-10-17 NOTE — Progress Notes (Signed)
[facility] Progress Note   Subjective: Still very much above OP EDW but no evidence of volume overload by exam. Denies SOB. Still having abdominal pain, asking when this will improve.   Objective Vitals:   10/16/19 1348 10/16/19 1730 10/16/19 2218 10/17/19 0555  BP: (!) 162/75  (!) 147/72 (!) 144/60  Pulse: 86  83 82  Resp: 18  16 16   Temp: 98.5 F (36.9 C)  98.3 F (36.8 C) 98.2 F (36.8 C)  TempSrc: Oral  Oral Oral  SpO2: 94%  94% 97%  Weight:  74.6 kg  73.8 kg   Physical Exam General: Pleasant older female in NAD Heart: S1,S2 RRR Lungs: CTAB A/P Abdomen: S, NT PD cath RLQ drsg intact Extremities: no LE edema.  Dialysis Access: PD cath as noted above   Additional Objective Labs: Basic Metabolic Panel: Recent Labs  Lab 10/15/19 0138 10/16/19 0611 10/17/19 0417  NA 130* 131* 133*  K 3.1* 3.7 3.0*  CL 92* 92* 96*  CO2 25 27 26   GLUCOSE 386* 411* 350*  BUN 19 18 15   CREATININE 6.01* 6.09* 5.84*  CALCIUM 7.8* 8.1* 8.4*  PHOS 4.2  4.3  --   --    Liver Function Tests: Recent Labs  Lab 10/10/19 1418 10/14/19 0307 10/15/19 0138  AST 30 15  --   ALT 18 11  --   ALKPHOS 55 50  --   BILITOT 1.1 0.2*  --   PROT 6.6 5.3*  --   ALBUMIN 2.4* 1.8* 1.6*   Recent Labs  Lab 10/10/19 1418  LIPASE 33   CBC: Recent Labs  Lab 10/13/19 0259 10/14/19 0307 10/15/19 0138 10/16/19 0611 10/17/19 0417  WBC 7.8 7.9 8.0 8.5 9.5  NEUTROABS 5.5 6.0 5.5 5.9 6.4  HGB 8.4* 8.4* 8.2* 8.2* 8.2*  HCT 26.4* 26.0* 25.2* 26.6* 26.5*  MCV 96.4 94.9 95.8 99.6 100.4*  PLT 200 204 227 232 261   Blood Culture    Component Value Date/Time   SDES URINE, RANDOM 04/24/2018 1555   SPECREQUEST NONE 04/24/2018 1555   CULT (A) 04/24/2018 1555    40,000 COLONIES/mL LACTOBACILLUS SPECIES Standardized susceptibility testing for this organism is not available. Performed at [facility], [place] [address], [place], [place] [address]    REPTSTATUS 04/26/2018 FINAL  04/24/2018 1555    Cardiac Enzymes: No results for input(s): CKTOTAL, CKMB, CKMBINDEX, TROPONINI in the last 168 hours. CBG: Recent Labs  Lab 10/16/19 0725 10/16/19 1154 10/16/19 1652 10/16/19 2203 10/17/19 0732  GLUCAP 336* 239* 163* 323* 249*   Iron Studies: No results for input(s): IRON, TIBC, TRANSFERRIN, FERRITIN in the last 72 hours. @lablastinr3 @ Studies/Results: No results found. Medications: . cefTRIAXone (ROCEPHIN)  IV 2 g (10/16/19 1725)   And  . metronidazole 500 mg (10/17/19 0209)  . dialysis solution 1.5% low-MG/low-CA    . dialysis solution 2.5% low-MG/low-CA     . aspirin EC  81 mg Oral Daily  . atorvastatin  10 mg Oral Daily  . calcitRIOL  0.25 mcg Oral Daily  . carvedilol  25 mg Oral BID WC  . darbepoetin (ARANESP) injection - NON-DIALYSIS  100 mcg Subcutaneous Q Fri-1800  . gabapentin  100 mg Oral QHS  . gentamicin cream  1 application Topical Daily  . heparin  5,000 Units Subcutaneous Q8H  . hydrALAZINE  100 mg Oral BID  . insulin aspart  0-6 Units Subcutaneous TID WC  . isosorbide mononitrate  15 mg Oral Daily  .  lisinopril  40 mg Oral Daily  . sevelamer carbonate  1,600 mg Oral TID WC  . spironolactone  25 mg Oral Daily     OutptPD:5 exchanges, 2400 fill vol, 1.5 hr dwells, edw 68.5kg -mircera 150 ug SQ on 12/17  Problem/Plan: 1. Acute sigmoid diverticulitis - per primary team, on IV abx and tolerating some solid diet   /Noted GI saw and sign off reccommended surg see as op  2. ESRD - on PD x 1 yr.Continue CCPD.  3. Hypokalemia - K 3.0 Start daily KDUR 20 MEQ today. Follow labs.  4. Volume/ BP - BP's appear okay. On mult BP meds at home, has resumed hydralazine and lisinopril here. Uses PRN Furosemide only at home. Furosemide on hold here. Wts have climbed since 01/04, very much above EDW. Wt 73.5 kg today, confirmed standing wt. Will use 1 exchange of 4.25 with rest of exchanges 2.5% and try to lower wt. Had been getting to EDW at home but  was using PRN furosemide. May need to resume.  5. H/o CHF- no volume on exam  6. Anemia ckd - last esa was on 12/17,  Hb is 8.2 today .Given Aranesp 100 Sq 10/13/19 7. MBD- fu ca Maren Reamer On Po calcitriol / no binder currently   [employer] Fernando Torry NP-C 10/17/2019, 9:09 AM  [employer] [phone]

## 2019-10-17 NOTE — Progress Notes (Signed)
PROGRESS NOTE  Patricia [name] KVQ:[id number] DOB: 1956/07/19 DOA: 10/10/2019 PCP: Ladell Pier, MD  Brief History   64 year old female with past medical history of ESRD on peritoneal dialysis, chronic combined systolic/diastolic CHF and diabetes type 2 on insulin came to the emergency room on 12/29 with several days of left lower quadrant abdominal pain and admitted for sigmoid diverticulitis.  Patient has been put on clear liquids and IV antibiotics. No signs of abscess noted. Patient seems to be feeling a little bit better, still some left lower quadrant abdominal pain. Nephrology has been consulted and patient continues to get peritoneal dialysis without incident.  The patient has tolerated diabetic diet. Awaiting eval by PT/OTfor disposition recommendations.  Consultants  . Nephrology  Antibiotics   Anti-infectives (From admission, onward)   Start     Dose/Rate Route Frequency Ordered Stop   10/11/19 1800  cefTRIAXone (ROCEPHIN) 2 g in sodium chloride 0.9 % 100 mL IVPB     2 g 200 mL/hr over 30 Minutes Intravenous Every 24 hours 10/10/19 2006     10/11/19 0200  metroNIDAZOLE (FLAGYL) IVPB 500 mg     500 mg 100 mL/hr over 60 Minutes Intravenous Every 8 hours 10/10/19 2006     10/10/19 1815  cefTRIAXone (ROCEPHIN) 2 g in sodium chloride 0.9 % 100 mL IVPB     2 g 200 mL/hr over 30 Minutes Intravenous  Once 10/10/19 1809 10/10/19 2024   10/10/19 1815  metroNIDAZOLE (FLAGYL) IVPB 500 mg     500 mg 100 mL/hr over 60 Minutes Intravenous  Once 10/10/19 1809 10/10/19 2024     Subjective  The patient is resting comfortably. No new complaints. She is tolerating a regular diet well.  Objective   Vitals:  Vitals:   10/17/19 0720 10/17/19 1522  BP: (!) 147/59 (!) 167/69  Pulse: 86 84  Resp: 16 16  Temp: 98.6 F (37 C) 98.6 F (37 C)  SpO2: 98% 99%    Exam:  Constitutional:  . The patient is awake, alert, and oriented x 3. No acute distress. Respiratory:  . No  increased work of breathing. . No wheezes, rales, or rhonchi . No tactile fremitus Cardiovascular:  . Regular rate and rhythm . No murmurs, ectopy, or gallups. . No lateral PMI. No thrills. Abdomen:  . Abdomen is soft, non-tender, non-distended . No hernias, masses, or organomegaly . Normoactive bowel sounds.  Musculoskeletal:  . No cyanosis, clubbing, or edema Skin:  . No rashes, lesions, ulcers . palpation of skin: no induration or nodules Neurologic:  . CN 2-12 intact . Sensation all 4 extremities intact Psychiatric:  . Mental status o Mood, affect appropriate o Orientation to person, place, time  . judgment and insight appear intact  I have personally reviewed the following:   Today's Data  . Vitals, BMP  Scheduled Meds: . aspirin EC  81 mg Oral Daily  . atorvastatin  10 mg Oral Daily  . calcitRIOL  0.25 mcg Oral Daily  . carvedilol  25 mg Oral BID WC  . darbepoetin (ARANESP) injection - NON-DIALYSIS  100 mcg Subcutaneous Q Fri-1800  . gabapentin  100 mg Oral QHS  . gentamicin cream  1 application Topical Daily  . heparin  5,000 Units Subcutaneous Q8H  . hydrALAZINE  100 mg Oral BID  . insulin aspart  0-15 Units Subcutaneous TID WC  . insulin aspart  0-5 Units Subcutaneous QHS  . insulin glargine  3 Units Subcutaneous QHS  . isosorbide mononitrate  15 mg Oral Daily  . lisinopril  40 mg Oral Daily  . sevelamer carbonate  1,600 mg Oral TID WC  . spironolactone  25 mg Oral Daily   Continuous Infusions: . cefTRIAXone (ROCEPHIN)  IV 2 g (10/17/19 1710)   And  . metronidazole 500 mg (10/17/19 1709)  . dialysis solution 1.5% low-MG/low-CA    . dialysis solution 2.5% low-MG/low-CA      Principal Problem:   Sigmoid diverticulitis Active Problems:   Hypertension associated with diabetes ([place])   Diabetes mellitus type 2 with complications (HCC)   Chronic combined systolic and diastolic CHF (congestive heart failure) (HCC)   Periumbilical hernia   ESRD on  peritoneal dialysis ([place])   LOS: 6 days    Proximal sigmoid diverticulitis: With no definite abscess. Pigtail drainage catheter seen in the anterior portion of the pelvis.  Continued presence of moderate size periumbilical hernia which contains loops of small bowel, but does not result in incarceration or obstruction. Will need to follow up with general surgery as outpatient. Continues to complain of left lower quadrant pain. Continue IV antibiotics and IV antiemetics as needed. Continue pain management. Currently on a diabetic diet.  ESRD on peritoneal dialysis: Continue PD. No acute issues. Nephrology following.  Hypervolemic hyponatremia/hypokalemia: Volume managed by hemodialysis. Electrolytes managed by hemodialysis. Continue daily renal panel. Nephrology following.  Anemia of chronic disease: Hemoglobin is stable. No overt bleeding.  Moderate periumbilical hernia:  Contains loops of small bowel but does not result in incarceration or obstruction per CT scan abdomen and pelvis without contrast done on 10/10/2019. She will need to follow up with general surgery as outpatient.  Chronic combined systolic and diastolic CHF: Last 2D echo done on 12/05/2018 showed LVEF 40 to 45% with evidence of diastolic dysfunction. Volume status managed by hemodialysis. Continue cardiac medications.  Type 2 diabetes with hyperglycemia: Last hemoglobin A1c 7.0 on 06/16/19. Continue insulin coverage.  Essential hypertension: Continue home medications. Blood pressure is at goal.  I have seen and examined this patient myself. I have spent 34 minutes in her evaluation and care.  DVT Prophylaxis: Subcutaneous heparin Code Status:Full code Family Communication:none available Disposition Plan: Home with home health PT/OT.

## 2019-10-18 ENCOUNTER — Other Ambulatory Visit: Payer: Self-pay

## 2019-10-18 ENCOUNTER — Encounter (HOSPITAL_COMMUNITY): Payer: Self-pay | Admitting: Emergency Medicine

## 2019-10-18 ENCOUNTER — Emergency Department (HOSPITAL_COMMUNITY)
Admission: EM | Admit: 2019-10-18 | Discharge: 2019-10-19 | Disposition: A | Discharge status: Home / Self Care | Payer: Medicare HMO | Attending: Emergency Medicine | Admitting: Emergency Medicine

## 2019-10-18 DIAGNOSIS — K5792 Diverticulitis of intestine, part unspecified, without perforation or abscess without bleeding: Secondary | ICD-10-CM

## 2019-10-18 DIAGNOSIS — I10 Essential (primary) hypertension: Secondary | ICD-10-CM | POA: Diagnosis not present

## 2019-10-18 DIAGNOSIS — I5042 Chronic combined systolic (congestive) and diastolic (congestive) heart failure: Secondary | ICD-10-CM | POA: Insufficient documentation

## 2019-10-18 DIAGNOSIS — E1122 Type 2 diabetes mellitus with diabetic chronic kidney disease: Secondary | ICD-10-CM | POA: Insufficient documentation

## 2019-10-18 DIAGNOSIS — I132 Hypertensive heart and chronic kidney disease with heart failure and with stage 5 chronic kidney disease, or end stage renal disease: Secondary | ICD-10-CM | POA: Diagnosis not present

## 2019-10-18 DIAGNOSIS — Z794 Long term (current) use of insulin: Secondary | ICD-10-CM | POA: Diagnosis not present

## 2019-10-18 DIAGNOSIS — Z7982 Long term (current) use of aspirin: Secondary | ICD-10-CM | POA: Insufficient documentation

## 2019-10-18 DIAGNOSIS — N186 End stage renal disease: Secondary | ICD-10-CM | POA: Diagnosis not present

## 2019-10-18 DIAGNOSIS — Z992 Dependence on renal dialysis: Secondary | ICD-10-CM | POA: Insufficient documentation

## 2019-10-18 DIAGNOSIS — Z79899 Other long term (current) drug therapy: Secondary | ICD-10-CM | POA: Insufficient documentation

## 2019-10-18 DIAGNOSIS — K5732 Diverticulitis of large intestine without perforation or abscess without bleeding: Secondary | ICD-10-CM | POA: Diagnosis not present

## 2019-10-18 HISTORY — DX: Diverticulitis of intestine, part unspecified, without perforation or abscess without bleeding: K57.92

## 2019-10-18 LAB — CBC WITH DIFFERENTIAL/PLATELET
Abs Immature Granulocytes: 0.11 10*3/uL — ABNORMAL HIGH (ref 0.00–0.07)
Basophils Absolute: 0.1 10*3/uL (ref 0.0–0.1)
Basophils Relative: 1 %
Eosinophils Absolute: 0.2 10*3/uL (ref 0.0–0.5)
Eosinophils Relative: 3 %
HCT: 27.9 % — ABNORMAL LOW (ref 36.0–46.0)
Hemoglobin: 8.8 g/dL — ABNORMAL LOW (ref 12.0–15.0)
Immature Granulocytes: 1 %
Lymphocytes Relative: 20 %
Lymphs Abs: 1.8 10*3/uL (ref 0.7–4.0)
MCH: 31.3 pg (ref 26.0–34.0)
MCHC: 31.5 g/dL (ref 30.0–36.0)
MCV: 99.3 fL (ref 80.0–100.0)
Monocytes Absolute: 0.6 10*3/uL (ref 0.1–1.0)
Monocytes Relative: 6 %
Neutro Abs: 6.5 10*3/uL (ref 1.7–7.7)
Neutrophils Relative %: 69 %
Platelets: 328 10*3/uL (ref 150–400)
RBC: 2.81 MIL/uL — ABNORMAL LOW (ref 3.87–5.11)
RDW: 14.6 % (ref 11.5–15.5)
WBC: 9.3 10*3/uL (ref 4.0–10.5)
nRBC: 0 % (ref 0.0–0.2)

## 2019-10-18 LAB — RENAL FUNCTION PANEL
Albumin: 1.6 g/dL — ABNORMAL LOW (ref 3.5–5.0)
Anion gap: 11 (ref 5–15)
BUN: 13 mg/dL (ref 8–23)
CO2: 25 mmol/L (ref 22–32)
Calcium: 8.5 mg/dL — ABNORMAL LOW (ref 8.9–10.3)
Chloride: 96 mmol/L — ABNORMAL LOW (ref 98–111)
Creatinine, Ser: 5.88 mg/dL — ABNORMAL HIGH (ref 0.44–1.00)
GFR calc Af Amer: 8 mL/min — ABNORMAL LOW (ref >60)
GFR calc non Af Amer: 7 mL/min — ABNORMAL LOW (ref >60)
Glucose, Bld: 386 mg/dL — ABNORMAL HIGH (ref 70–99)
Phosphorus: 3 mg/dL (ref 2.5–4.6)
Potassium: 3.8 mmol/L (ref 3.5–5.1)
Sodium: 132 mmol/L — ABNORMAL LOW (ref 135–145)

## 2019-10-18 LAB — COMPREHENSIVE METABOLIC PANEL
ALT: 8 U/L (ref 0–44)
ALT: 10 U/L (ref 0–44)
AST: 13 U/L — ABNORMAL LOW (ref 15–41)
AST: 14 U/L — ABNORMAL LOW (ref 15–41)
Albumin: 1.6 g/dL — ABNORMAL LOW (ref 3.5–5.0)
Albumin: 1.8 g/dL — ABNORMAL LOW (ref 3.5–5.0)
Alkaline Phosphatase: 51 U/L (ref 38–126)
Alkaline Phosphatase: 51 U/L (ref 38–126)
Anion gap: 8 (ref 5–15)
Anion gap: 11 (ref 5–15)
BUN: 13 mg/dL (ref 8–23)
BUN: 18 mg/dL (ref 8–23)
CO2: 26 mmol/L (ref 22–32)
CO2: 27 mmol/L (ref 22–32)
Calcium: 8.5 mg/dL — ABNORMAL LOW (ref 8.9–10.3)
Calcium: 9 mg/dL (ref 8.9–10.3)
Chloride: 98 mmol/L (ref 98–111)
Chloride: 95 mmol/L — ABNORMAL LOW (ref 98–111)
Creatinine, Ser: 5.74 mg/dL — ABNORMAL HIGH (ref 0.44–1.00)
Creatinine, Ser: 6.49 mg/dL — ABNORMAL HIGH (ref 0.44–1.00)
GFR calc Af Amer: 9 mL/min — ABNORMAL LOW (ref >60)
GFR calc Af Amer: 7 mL/min — ABNORMAL LOW (ref >60)
GFR calc non Af Amer: 7 mL/min — ABNORMAL LOW (ref >60)
GFR calc non Af Amer: 6 mL/min — ABNORMAL LOW (ref >60)
Glucose, Bld: 389 mg/dL — ABNORMAL HIGH (ref 70–99)
Glucose, Bld: 228 mg/dL — ABNORMAL HIGH (ref 70–99)
Potassium: 3.8 mmol/L (ref 3.5–5.1)
Potassium: 4.1 mmol/L (ref 3.5–5.1)
Sodium: 132 mmol/L — ABNORMAL LOW (ref 135–145)
Sodium: 133 mmol/L — ABNORMAL LOW (ref 135–145)
Total Bilirubin: 0.2 mg/dL — ABNORMAL LOW (ref 0.3–1.2)
Total Bilirubin: 0.1 mg/dL — ABNORMAL LOW (ref 0.3–1.2)
Total Protein: 5.8 g/dL — ABNORMAL LOW (ref 6.5–8.1)
Total Protein: 5.9 g/dL — ABNORMAL LOW (ref 6.5–8.1)

## 2019-10-18 LAB — CBC
HCT: 32.4 % — ABNORMAL LOW (ref 36.0–46.0)
Hemoglobin: 9.6 g/dL — ABNORMAL LOW (ref 12.0–15.0)
MCH: 30.8 pg (ref 26.0–34.0)
MCHC: 29.6 g/dL — ABNORMAL LOW (ref 30.0–36.0)
MCV: 103.8 fL — ABNORMAL HIGH (ref 80.0–100.0)
Platelets: 369 10*3/uL (ref 150–400)
RBC: 3.12 MIL/uL — ABNORMAL LOW (ref 3.87–5.11)
RDW: 15.1 % (ref 11.5–15.5)
WBC: 10.7 10*3/uL — ABNORMAL HIGH (ref 4.0–10.5)
nRBC: 0 % (ref 0.0–0.2)

## 2019-10-18 LAB — URINALYSIS, ROUTINE W REFLEX MICROSCOPIC
Bilirubin Urine: NEGATIVE
Glucose, UA: >=500 mg/dL — AB
Hgb urine dipstick: NEGATIVE
Ketones, ur: NEGATIVE mg/dL
Nitrite: NEGATIVE
Protein, ur: 100 mg/dL — AB
Specific Gravity, Urine: 1.005 (ref 1.005–1.030)
pH: 6 (ref 5.0–8.0)

## 2019-10-18 LAB — GLUCOSE, CAPILLARY
Glucose-Capillary: 376 mg/dL — ABNORMAL HIGH (ref 70–99)
Glucose-Capillary: 351 mg/dL — ABNORMAL HIGH (ref 70–99)
Glucose-Capillary: 239 mg/dL — ABNORMAL HIGH (ref 70–99)
Glucose-Capillary: 107 mg/dL — ABNORMAL HIGH (ref 70–99)

## 2019-10-18 LAB — PREALBUMIN: Prealbumin: 12.1 mg/dL — ABNORMAL LOW (ref 18–38)

## 2019-10-18 LAB — LIPASE, BLOOD: Lipase: 21 U/L (ref 11–51)

## 2019-10-18 MED ORDER — FUROSEMIDE 80 MG PO TABS: 80.0000 mg | ORAL_TABLET | Freq: Once | ORAL | Status: DC

## 2019-10-18 MED ORDER — LANTUS SOLOSTAR 100 UNIT/ML ~~LOC~~ SOPN: 6.0000 [IU] | PEN_INJECTOR | Freq: Every day | SUBCUTANEOUS | 2 refills | Status: AC

## 2019-10-18 MED ORDER — SODIUM CHLORIDE 0.9% FLUSH: 3.0000 mL | Freq: Once | INTRAVENOUS | Status: DC

## 2019-10-18 MED ORDER — METRONIDAZOLE 500 MG PO TABS: 500.0000 mg | ORAL_TABLET | Freq: Three times a day (TID) | ORAL | 0 refills | Status: DC

## 2019-10-18 MED ORDER — PRO-STAT SUGAR FREE PO LIQD
30.0000 mL | Freq: Two times a day (BID) | ORAL | Status: DC
  Administered 2019-10-18: 30 mL via ORAL
  Filled 2019-10-18: qty 30

## 2019-10-18 MED ORDER — PRO-STAT SUGAR FREE PO LIQD: 30.0000 mL | Freq: Two times a day (BID) | ORAL | 0 refills | Status: AC

## 2019-10-18 MED ORDER — AMOXICILLIN-POT CLAVULANATE 875-125 MG PO TABS: 1.0000 | ORAL_TABLET | Freq: Two times a day (BID) | ORAL | 0 refills | Status: DC

## 2019-10-18 MED ORDER — DELFLEX-LC/4.25% DEXTROSE 483 MOSM/L IP SOLN: INTRAPERITONEAL | Status: DC

## 2019-10-18 MED ORDER — FUROSEMIDE 80 MG PO TABS
80.0000 mg | ORAL_TABLET | Freq: Every day | ORAL | Status: DC
  Administered 2019-10-18: 10:00:00 80 mg via ORAL
  Filled 2019-10-18: qty 1

## 2019-10-18 MED ORDER — OXYCODONE HCL 5 MG PO TABS: 5.0000 mg | ORAL_TABLET | ORAL | 0 refills | Status: AC | PRN

## 2019-10-18 MED ORDER — AMOXICILLIN-POT CLAVULANATE 875-125 MG PO TABS
1.0000 | ORAL_TABLET | Freq: Two times a day (BID) | ORAL | Status: AC
  Administered 2019-10-18: 13:00:00 1 via ORAL
  Filled 2019-10-18: qty 1

## 2019-10-18 MED ORDER — METRONIDAZOLE 500 MG PO TABS: 500.0000 mg | ORAL_TABLET | Freq: Three times a day (TID) | ORAL | Status: DC

## 2019-10-18 MED ORDER — FUROSEMIDE 80 MG PO TABS: 80.0000 mg | ORAL_TABLET | Freq: Every day | ORAL | 0 refills | Status: DC

## 2019-10-18 NOTE — Progress Notes (Signed)
Inpatient Diabetes Program Recommendations  AACE/ADA: New Consensus Statement on Inpatient Glycemic Control (2015)  Target Ranges:  Prepandial:   less than 140 mg/dL      Peak postprandial:   less than 180 mg/dL (1-2 hours)      Critically ill patients:  140 - 180 mg/dL   Lab Results  Component Value Date   GLUCAP 239 (H) 10/18/2019   HGBA1C 7.0 (H) 06/16/2019    Review of Glycemic Control Results for KORRIN, WATERFIELD (MRN [id number]) as of 10/18/2019 09:45  Ref. Range 10/17/2019 22:01 10/18/2019 02:27 10/18/2019 05:08 10/18/2019 07:54  Glucose-Capillary Latest Ref Range: 70 - 99 mg/dL 403 (H) 376 (H) 351 (H) 239 (H)   Diabetes history:DM2 Outpatient Diabetes medications:Lantus 4 units QHS, Novolog 3 units TID with meals, Glipizide 10 mg BID Current orders for Inpatient glycemic control:Novolog 0-15 units TID with meals, Novolog 0-5 units QHS, Lantus 3 units QHS  Inpatient Diabetes Program Recommendations:  Noted patient continues to have poor oral intake. Hyperglycemia noted during PD and addition of Lantus.  Consider changing correction to Novolog 0-9 units Q4H.   Thanks, Bronson Curb, MSN, RNC-OB Diabetes Coordinator [phone] (8a-5p)

## 2019-10-18 NOTE — TOC Progression Note (Signed)
Transition of Care [facility]) - Progression Note    Patient Details  Name: Patricia [name] MRN: [id number] Date of Birth: 26-Aug-1956  Transition of Care [facility]) CM/SW Contact  Jacalyn Lefevre Edson Snowball, RN Phone Number: 10/18/2019, 1:23 PM  Clinical Narrative:     PT recommending wheel chair. Ordered same . [employer] only has 20 inches and up. [employer] has no wheelchairs. Ordered through [employer]. There is a national back order on Art therapist.  Expected Discharge Plan: [place] Barriers to Discharge: Continued Medical Work up  Expected Discharge Plan and Services Expected Discharge Plan: [place]   Discharge Planning Services: CM Consult Post Acute Care Choice: [place] arrangements for the past 2 months: Apartment Expected Discharge Date: 10/18/19               DME Arranged: N/A         HH Arranged: OT, PT           Social Determinants of Health (SDOH) Interventions    Readmission Risk Interventions No flowsheet data found.

## 2019-10-18 NOTE — Progress Notes (Signed)
Patient discharged to home. Verbalizes understanding of all discharge instructions including incision discharge medications and follow up MD visits. Patient's daughter at bedside for the review of discharge instructions.

## 2019-10-18 NOTE — Progress Notes (Signed)
Physical Therapy Treatment Patient Details Name: Patricia [name] MRN: [id number] DOB: June 07, 1956 Today's Date: 10/18/2019    History of Present Illness Pt is 64 year old female with past medical history of ESRD on peritoneal dialysis, chronic combined systolic/diastolic CHF and diabetes type 2 on insulin came to the emergency room on 12/29 with several days of left lower quadrant abdominal pain and admitted for sigmoid diverticulitis currently being managed with antibiotics and clear liquids.    PT Comments    Continuing work on functional mobility and activity tolerance;  More steady with walking today than last session, still recommend RW for support with ambulation (which I believe she already has); I still consider home the most therapeutic place, and she tells me her daughter will be able to provide plenty of assist; Given her fatigue with ambulation and activity, recommend wheelchair for home as well   Follow Up Recommendations  Home health PT;Supervision for mobility/OOB     Equipment Recommendations  Wheelchair (measurements PT)    Recommendations for Other Services       Precautions / Restrictions Precautions Precautions: Fall    Mobility  Bed Mobility               General bed mobility comments: in recliner upon arrival  Transfers Overall transfer level: Needs assistance Equipment used: Rolling walker (2 wheeled) Transfers: Sit to/from Stand Sit to Stand: Min guard(without physical contact)         General transfer comment: Increased time  Ambulation/Gait Ambulation/Gait assistance: Min guard Gait Distance (Feet): 80 Feet Assistive device: Rolling walker (2 wheeled) Gait Pattern/deviations: Step-through pattern;Decreased step length - right;Decreased step length - left;Decreased stride length Gait velocity: quite slow   General Gait Details: Slow moving, still with some unsteadiness with amb, but much improved compared to last PT session   Stairs              Wheelchair Mobility    Modified Rankin (Stroke Patients Only)       Balance Overall balance assessment: Needs assistance Sitting-balance support: No upper extremity supported;Feet supported Sitting balance-Leahy Scale: Good       Standing balance-Leahy Scale: Fair                              Cognition Arousal/Alertness: Awake/alert Behavior During Therapy: WFL for tasks assessed/performed Overall Cognitive Status: Within Functional Limits for tasks assessed                                        Exercises      General Comments        Pertinent Vitals/Pain Pain Assessment: Faces Faces Pain Scale: Hurts little more Pain Location: L Lower quadrant of abdomen Pain Descriptors / Indicators: Cramping;Sore Pain Intervention(s): Monitored during session    Home Living                      Prior Function            PT Goals (current goals can now be found in the care plan section) Acute Rehab PT Goals Patient Stated Goal: return to daughter's home at d/c PT Goal Formulation: With patient/family Time For Goal Achievement: 10/27/19 Potential to Achieve Goals: Good Progress towards PT goals: Progressing toward goals    Frequency    Min 3X/week  PT Plan Current plan remains appropriate    Co-evaluation              AM-PAC PT "6 Clicks" Mobility   Outcome Measure  Help needed turning from your back to your side while in a flat bed without using bedrails?: None Help needed moving from lying on your back to sitting on the side of a flat bed without using bedrails?: A Little Help needed moving to and from a bed to a chair (including a wheelchair)?: A Little Help needed standing up from a chair using your arms (e.g., wheelchair or bedside chair)?: A Little Help needed to walk in hospital room?: A Little Help needed climbing 3-5 steps with a railing? : A Little 6 Click Score: 19    End of Session  Equipment Utilized During Treatment: Gait belt Activity Tolerance: Patient tolerated treatment well;Other (comment)(though fatigued after walking) Patient left: in chair;with call bell/phone within reach Nurse Communication: Mobility status PT Visit Diagnosis: Other abnormalities of gait and mobility (R26.89)     Time: 7543-6067 PT Time Calculation (min) (ACUTE ONLY): 18 min  Charges:  $Gait Training: 8-22 mins                     Roney Marion, PT  [place] Pager [phone] Office [place] 10/18/2019, 1:08 PM

## 2019-10-18 NOTE — Progress Notes (Signed)
[facility] Progress Note   Subjective: Feels better this AM but still weak. Discussed with Patricia [name] has been taking furosemide 80 mg daily at home with no issues getting to EDW/Normal Na. Will resume. Not eating well.   Objective Vitals:   10/17/19 1522 10/17/19 1837 10/17/19 2203 10/18/19 0640  BP: (!) 167/69 (!) 158/65 (!) 160/75 (!) 162/68  Pulse: 84 83 83 83  Resp: 16 16 16 16   Temp: 98.6 F (37 C) 98.6 F (37 C) 98.3 F (36.8 C) 98.6 F (37 C)  TempSrc: Oral Oral Oral Oral  SpO2: 99% 98% 99% 99%  Weight:  76.6 kg  74.2 kg   Physical Exam General:Pleasant older female in NAD Heart:S1,S2 RRR Lungs:CTAB A/P Abdomen:S, NT PD cath RLQ drsg intact Extremities:no LE edema. Dialysis Access:PD cath as noted above   Additional Objective Labs: Basic Metabolic Panel: Recent Labs  Lab 10/15/19 0138 10/17/19 0417 10/17/19 1622 10/18/19 0238  NA 130* 133* 133* 132*  132*  K 3.1* 3.0* 4.1 3.8  3.8  CL 92* 96* 96* 98  96*  CO2 25 26 26 26  25   GLUCOSE 386* 350* 176* 389*  386*  BUN 19 15 15 13  13   CREATININE 6.01* 5.84* 6.40* 5.74*  5.88*  CALCIUM 7.8* 8.4* 8.3* 8.5*  8.5*  PHOS 4.2  4.3  --   --  3.0   Liver Function Tests: Recent Labs  Lab 10/14/19 0307 10/15/19 0138 10/18/19 0238  AST 15  --  13*  ALT 11  --  8  ALKPHOS 50  --  51  BILITOT 0.2*  --  0.2*  PROT 5.3*  --  5.8*  ALBUMIN 1.8* 1.6* 1.6*  1.6*   No results for input(s): LIPASE, AMYLASE in the last 168 hours. CBC: Recent Labs  Lab 10/14/19 0307 10/15/19 0138 10/16/19 0611 10/17/19 0417 10/18/19 0238  WBC 7.9 8.0 8.5 9.5 9.3  NEUTROABS 6.0 5.5 5.9 6.4 6.5  HGB 8.4* 8.2* 8.2* 8.2* 8.8*  HCT 26.0* 25.2* 26.6* 26.5* 27.9*  MCV 94.9 95.8 99.6 100.4* 99.3  PLT 204 227 232 261 328   Blood Culture    Component Value Date/Time   SDES URINE, RANDOM 04/24/2018 1555   SPECREQUEST NONE 04/24/2018 1555   CULT (A) 04/24/2018 1555    40,000 COLONIES/mL  LACTOBACILLUS SPECIES Standardized susceptibility testing for this organism is not available. Performed at [facility], [place] [address], [place], [place] [address]    REPTSTATUS 04/26/2018 FINAL 04/24/2018 1555    Cardiac Enzymes: No results for input(s): CKTOTAL, CKMB, CKMBINDEX, TROPONINI in the last 168 hours. CBG: Recent Labs  Lab 10/17/19 1655 10/17/19 2201 10/18/19 0227 10/18/19 0508 10/18/19 0754  GLUCAP 154* 403* 376* 351* 239*   Iron Studies: No results for input(s): IRON, TIBC, TRANSFERRIN, FERRITIN in the last 72 hours. @lablastinr3 @ Studies/Results: No results found. Medications: . cefTRIAXone (ROCEPHIN)  IV 2 g (10/17/19 1710)   And  . metronidazole 500 mg (10/18/19 3976)  . dialysis solution 1.5% low-MG/low-CA    . dialysis solution 2.5% low-MG/low-CA     . aspirin EC  81 mg Oral Daily  . atorvastatin  10 mg Oral Daily  . calcitRIOL  0.25 mcg Oral Daily  . carvedilol  25 mg Oral BID WC  . darbepoetin (ARANESP) injection - NON-DIALYSIS  100 mcg Subcutaneous Q Fri-1800  . gabapentin  100 mg Oral QHS  . gentamicin cream  1 application Topical Daily  . heparin  5,000  Units Subcutaneous Q8H  . hydrALAZINE  100 mg Oral BID  . insulin aspart  0-15 Units Subcutaneous TID WC  . insulin aspart  0-5 Units Subcutaneous QHS  . insulin glargine  3 Units Subcutaneous QHS  . isosorbide mononitrate  15 mg Oral Daily  . lisinopril  40 mg Oral Daily  . sevelamer carbonate  1,600 mg Oral TID WC  . spironolactone  25 mg Oral Daily     OutptPD:5 exchanges, 2400 fill vol, 1.5 hr dwells, edw 68.5kg -mircera 150 ug SQ on 12/17  Problem/Plan: 1. Acute sigmoid diverticulitis - per primary team, on IV abx andtolerating some solid diet/Noted GI saw and sign off reccommended surg see as op  2. ESRD - on PD x 1 yr.Continue CCPD per current regimen. Use one 4.25% exchange and four 2.5% exchanges tonight.  3. Hypokalemia - Start daily KDUR 20 MEQ today. K+ 3.8  today. Follow labs.  4. Volume/ BP - BP's have climbed. On mult BP meds at home, has resumed hydralazine spironolactone, carvedilol and lisinopril here. Uses Furosemide 80 mg daily at home. Furosemide on hold here. Wts have climbed since 01/04, very much above EDW. Wt 74.2 kg today, confirmed standing wt. Used 1 exchange of 4.25 with rest of exchanges 2.5% but weight is higher. Had been getting to EDW at home but was using furosemide. Na down, resume furosemide. Will give furosemide today. Use 4.25%/2.5% again tonight.  5. H/O CHF- no volume on exam 6. Anemia ckd - last esa was on 12/17, Hb is 8.2today .GivenAranesp 100 Sq01/01/21 7. MBD- fu ca Maren Reamer On Po calcitriol / no binder currently 8. Nutrition-Albumin low. Add prostat. Does not need renal diet-change to carb mod diet.   Vola Beneke H. Aydden Cumpian NP-C 10/18/2019, 9:18 AM  [employer] [phone]

## 2019-10-18 NOTE — Care Management (Signed)
    Durable Medical Equipment  (From admission, onward)         Start     Ordered   10/18/19 1316  For home use only DME lightweight manual wheelchair with seat cushion  Once    Comments: Patient suffers from ESRD on peritoneal dialysis, chronic combined systolic/diastolic CHF and diabetes type 2 on insulin which impairs their ability to perform daily activities like ambulation in the home.  A cane will not resolve  issue with performing activities of daily living. A wheelchair will allow patient to safely perform daily activities. Patient is not able to propel themselves in the home using a standard weight wheelchair due to weakness. Patient can self propel in the lightweight wheelchair. Length of need life time  Accessories: elevating leg rests (ELRs), wheel locks, extensions and anti-tippers.  Seat and back cushions   10/18/19 1317

## 2019-10-18 NOTE — Progress Notes (Signed)
Rynesha, patient's daughter will pick up wheelchair from [address] tonight, 10/18/19.

## 2019-10-18 NOTE — Discharge Summary (Signed)
Physician Discharge Summary  Patricia [name] STM:[id number] DOB: [birth year] DOA: 10/10/2019  PCP: Ladell Pier, MD  Admit date: 10/10/2019 Discharge date: 10/18/2019  Recommendations for Outpatient Follow-up:  1. Continue pd per renal. 2. Follow up with PCP in 7-10 days. 3. Follow up with General surgery within 1 month for periumbilical hernia.  Follow-up [facility] Follow up.   Contact information: [address] [address] [phone]          Discharge Diagnoses: Principal diagnosis is #1 1.  Proximal sigmoid diverticulitis 2. ESRD on PD 3. Abdominal pain 4. Hypervolemic hyponatremia 5. Hypokalemia 6. Anemia of chronic disease 7. Moderate periumbilical hernia 8. Chronic combined systolic and diastolic CHF. 9. DM II with hyperglycemia 10. Essential hypertension  Discharge Condition: Fair  Disposition: Home  Diet recommendation: Carbohydrate controlled  Filed Weights   10/17/19 0720 10/17/19 1837 10/18/19 0640  Weight: 75.6 kg 76.6 kg 74.2 kg   History of present illness:  Patricia [name] is a 64 y.o. female with medical history significant for ESRD on peritoneal dialysis, chronic combined systolic and diastolic CHF (EF 94-17% by echocardiogram on 09/06/2019), insulin-dependent type 2 diabetes, hypertension, anemia of chronic disease, and recent laparoscopic repair of incarcerated umbilical hernia on June 16, 2019 who presents to the ED for evaluation of abdominal pain.  Patient reports having 2 weeks of left lower quadrant abdominal pain that has been progressively worsening.  She has had poor oral intake with nausea but has not had any emesis.  She reports having recent loose stools without watery diarrhea.  She has not seen any blood in her stool.  She denies any subjective fevers but is having night sweats and chills.  She felt her pain was similar to her prior hernia pain.  She called her GI office who  recommended she come to the ED for further evaluation.  She otherwise denies any chest pain, dyspnea, cough, dysuria, or peripheral edema.  ED Course:  Initial vitals showed BP 132/64, pulse 74, RR 16, temp 98.1 Fahrenheit, SPO2 99% on room air.  Labs are notable for WBC 10.8, hemoglobin 9.9, platelets 212,000, sodium 136, potassium 3.1, BUN 36, creatinine 7.54, lipase 33.  Urinalysis showed negative nitrites, negative leukocytes, 0-5 RBCs, 6-10 WBCs, rare bacteria on microscopy.  SARS-CoV-2 PCR test was collected and pending.  CT abdomen/pelvis without contrast showed proximal sigmoid diverticulitis without definite abscess.  Continue presence of moderate sized umbilical hernia which contains loops of small bowel are seen without evidence of incarceration or obstruction.  Pigtail drainage catheter seen in the anterior portion of the pelvis.  EDP discussed the case with on-call GI who recommended admission for IV antibiotics and liquid diet.  Patient was given IV ceftriaxone and Flagyl and the hospitalist service was consulted admit for further evaluation and management.  Hospital Course:  64 year old female with past medical history of ESRD on peritoneal dialysis, chronic combined systolic/diastolic CHF and diabetes type 2 on insulin came to the emergency room on 12/29 with several days of left lower quadrant abdominal pain and admitted for sigmoid diverticulitis.  Patient has been put on clear liquids and IV antibiotics. No signs of abscess noted. Patient seems to be feeling a little bit better, still some left lower quadrant abdominal pain. Nephrology has been consulted and patient continues to get peritoneal dialysis without incident.  The patient has tolerated diabetic diet. The patient has been evaluated by PT/OT and their recommendation is for  discharge to home.   After being told that she would be discharged to home today, this patient became tearful and irate. She told me that  this writer was "pushing her" out of the hospital after other doctors had told her to stay. She stated that the renal team told her this. I have discussed the patient with Dr. Louretta Shorten and his PA Velva Harman. Neither of them state that they feel that the patient needs to stay longer. They both deny telling the patient that she should stay as inpatient. When I ask the patient what her concerns are regarding transfer she just cries and tells me that I am being horrible to her and forcing her out of the hospital.  Today's assessment: S: Tearful. Angry. Patient declines physical exam. O: Vitals:  Vitals:   10/17/19 2203 10/18/19 0640  BP: (!) 160/75 (!) 162/68  Pulse: 83 83  Resp: 16 16  Temp: 98.3 F (36.8 C) 98.6 F (37 C)  SpO2: 99% 99%   Please see physical exam for 10/17/2019 for last physical exam by attending prior to discharge. Full physical exam today was performed by PA for the renal service, Juanell Fairly, PA-C.   Discharge Instructions  Discharge Instructions    Activity as tolerated - No restrictions   Complete by: As directed    Call MD for:  severe uncontrolled pain   Complete by: As directed    Call MD for:  temperature >100.4   Complete by: As directed    Diet - low sodium heart healthy   Complete by: As directed    Discharge instructions   Complete by: As directed    Continue pd per renal. Follow up with PCP in 7-10 days. Follow up with General surgery within 1 month for periumbilical hernia.   Increase activity slowly   Complete by: As directed      Allergies as of 10/18/2019      Reactions   Codone [hydrocodone] Itching   Norvasc [amlodipine Besylate] Swelling   Lower extremity   Hydralazine Other (See Comments), Swelling   Leg swelling   Sulfa Antibiotics Itching, Rash      Medication List    STOP taking these medications   dicyclomine 10 MG capsule Commonly known as: BENTYL   glipiZIDE 10 MG tablet Commonly known as: GLUCOTROL   glucose 4 GM chewable  tablet   ondansetron 4 MG disintegrating tablet Commonly known as: Zofran ODT     TAKE these medications   Accu-Chek FastClix Lancets Misc Uad tid   Accu-Chek Guide w/Device Kit 1 each by Does not apply route 3 (three) times daily. Use tid as directed   acetaminophen 325 MG tablet Commonly known as: Tylenol Take 2 tablets (650 mg total) by mouth every 6 (six) hours as needed. What changed: reasons to take this   albuterol 108 (90 Base) MCG/ACT inhaler Commonly known as: VENTOLIN HFA Inhale 1-2 puffs into the lungs every 6 (six) hours as needed for wheezing or shortness of breath.   amoxicillin-clavulanate 875-125 MG tablet Commonly known as: AUGMENTIN Take 1 tablet by mouth every 12 (twelve) hours.   aspirin EC 81 MG tablet Take 1 tablet (81 mg total) by mouth daily.   atorvastatin 20 MG tablet Commonly known as: LIPITOR Take 1/2 (one-half) tablet by mouth once daily What changed: See the new instructions.   calcitRIOL 0.25 MCG capsule Commonly known as: ROCALTROL Take 0.25 mcg by mouth daily.   carvedilol 25 MG tablet Commonly known as: COREG  TAKE 1 TABLET BY MOUTH TWICE DAILY WITH A MEAL What changed: See the new instructions.   feeding supplement (PRO-STAT SUGAR FREE 64) Liqd Take 30 mLs by mouth 2 (two) times daily.   furosemide 80 MG tablet Commonly known as: LASIX Take 1 tablet (80 mg total) by mouth daily. What changed: when to take this   gabapentin 100 MG capsule Commonly known as: NEURONTIN TAKE 1 CAPSULE (100 MG TOTAL) BY MOUTH AT BEDTIME.   gentamicin cream 0.1 % Commonly known as: GARAMYCIN Apply 1 application topically See admin instructions. Apply topically to dialysis site after showering - every other day   glucose blood test strip Commonly known as: Accu-Chek Guide Use as instructed tid   hydrALAZINE 100 MG tablet Commonly known as: APRESOLINE Take 1 tablet (100 mg total) by mouth 2 (two) times daily.   insulin aspart 100 UNIT/ML  injection Commonly known as: NovoLOG 3 units before meals.   Insulin Pen Needle 32G X 4 MM Misc Commonly known as: TRUEplus Pen Needles Use as directed to inject insulin.   Insulin Pen Needle 31G X 5 MM Misc Use as directed   isosorbide mononitrate 30 MG 24 hr tablet Commonly known as: IMDUR Take 1/2 (one-half) tablet by mouth once daily What changed: See the new instructions.   Lantus SoloStar 100 UNIT/ML Solostar Pen Generic drug: Insulin Glargine Inject 6 Units into the skin at bedtime. What changed: how much to take   lisinopril 40 MG tablet Commonly known as: ZESTRIL Take 40 mg by mouth daily.   metroNIDAZOLE 500 MG tablet Commonly known as: FLAGYL Take 1 tablet (500 mg total) by mouth every 8 (eight) hours.   oxyCODONE 5 MG immediate release tablet Commonly known as: Oxy IR/ROXICODONE Take 1 tablet (5 mg total) by mouth every 4 (four) hours as needed for moderate pain or breakthrough pain (Hold & Call MD if SBP<90, HR<65, RR<10, O2<90, or altered mental status.).   sevelamer 800 MG tablet Commonly known as: RENAGEL Take 1,600-3,200 mg by mouth See admin instructions. Take 2-4 tablets (1600-3200 mg) by mouth up to three times daily with meals - 2-3 tablets with small meal, 4 tablets with large meal   spironolactone 25 MG tablet Commonly known as: ALDACTONE Take 25 mg by mouth daily.   Systane 0.4-0.3 % Soln Generic drug: Polyethyl Glycol-Propyl Glycol Place 1 drop into both eyes 3 (three) times daily as needed (dry eyes).            Durable Medical Equipment  (From admission, onward)         Start     Ordered   10/18/19 1316  For home use only DME lightweight manual wheelchair with seat cushion  Once    Comments: Patient suffers from ESRD on peritoneal dialysis, chronic combined systolic/diastolic CHF and diabetes type 2 on insulin which impairs their ability to perform daily activities like ambulation in the home.  A cane will not resolve  issue with  performing activities of daily living. A wheelchair will allow patient to safely perform daily activities. Patient is not able to propel themselves in the home using a standard weight wheelchair due to weakness. Patient can self propel in the lightweight wheelchair. Length of need life time  Accessories: elevating leg rests (ELRs), wheel locks, extensions and anti-tippers.  Seat and back cushions   10/18/19 1317         Allergies  Allergen Reactions  . Codone [Hydrocodone] Itching  . Norvasc [Amlodipine Besylate] Swelling  Lower extremity  . Hydralazine Other (See Comments) and Swelling    Leg swelling  . Sulfa Antibiotics Itching and Rash    The results of significant diagnostics from this hospitalization (including imaging, microbiology, ancillary and laboratory) are listed below for reference.    Significant Diagnostic Studies: CT ABDOMEN PELVIS WO CONTRAST  Result Date: 10/10/2019 CLINICAL DATA:  Left lower quadrant abdominal pain. EXAM: CT ABDOMEN AND PELVIS WITHOUT CONTRAST TECHNIQUE: Multidetector CT imaging of the abdomen and pelvis was performed following the standard protocol without IV contrast. COMPARISON:  June 16, 2019.  May 25, 2019. FINDINGS: Lower chest: No acute abnormality. Hepatobiliary: No focal liver abnormality is seen. No gallstones, gallbladder wall thickening, or biliary dilatation. Pancreas: Unremarkable. No pancreatic ductal dilatation or surrounding inflammatory changes. Spleen: Normal in size without focal abnormality. Adrenals/Urinary Tract: Adrenal glands are unremarkable. Kidneys are normal, without renal calculi, focal lesion, or hydronephrosis. Bladder is unremarkable. Stomach/Bowel: The stomach is unremarkable. The appendix appears normal. There is no evidence of bowel obstruction. There is continued wall thickening and inflammation involving the proximal sigmoid colon consistent with diverticulitis. Vascular/Lymphatic: Aortic atherosclerosis. No  enlarged abdominal or pelvic lymph nodes. Reproductive: Status post hysterectomy. No adnexal masses. Other: There is continued presence of pelvic drainage catheter in the anterior pelvis. No fluid collection is noted around its distal tip. However there is noted fluid surrounding its portion just before it enters the peritoneal space. Also noted is continued presence of moderate size periumbilical hernia which contains loops of small bowel, but does not result in obstruction or incarceration. Musculoskeletal: No acute or significant osseous findings. IMPRESSION: Continued presence of proximal sigmoid diverticulitis. No definite abscess is noted at this time. Continued presence of pigtail drainage catheter seen in the anterior portion of the pelvis. No fluid collection is noted around its distal portion, but fluid is noted around this portion before it enters the peritoneal space which was present on prior exam. Continued presence of moderate size periumbilical hernia which contains loops of small bowel, but does not result in incarceration or obstruction. Electronically Signed   By: Marijo Conception M.D.   On: 10/10/2019 15:57   DG Abd 1 View  Result Date: 10/11/2019 CLINICAL DATA:  Umbilical hernia. EXAM: ABDOMEN - 1 VIEW COMPARISON:  Abdominal CT yesterday. FINDINGS: Peritoneal dialysis catheter in place. No bowel dilatation to suggest obstruction. Small volume of stool in the colon. No evidence of free air in the supine views. Vascular calcifications. IMPRESSION: No evidence of bowel obstruction. Patient's known umbilical hernia is not well assessed on this supine view. Peritoneal dialysis catheter in place. Electronically Signed   By: Keith Rake M.D.   On: 10/11/2019 06:13    Microbiology: Recent Results (from the past 240 hour(s))  SARS CORONAVIRUS 2 (TAT 6-24 HRS) Nasopharyngeal Nasopharyngeal Swab     Status: None   Collection Time: 10/10/19  6:16 PM   Specimen: Nasopharyngeal Swab  Result  Value Ref Range Status   SARS Coronavirus 2 NEGATIVE NEGATIVE Final    Comment: (NOTE) SARS-CoV-2 target nucleic acids are NOT DETECTED. The SARS-CoV-2 RNA is generally detectable in upper and lower respiratory specimens during the acute phase of infection. Negative results do not preclude SARS-CoV-2 infection, do not rule out co-infections with other pathogens, and should not be used as the sole basis for treatment or other patient management decisions. Negative results must be combined with clinical observations, patient history, and epidemiological information. The expected result is Negative. Fact Sheet for Patients: SugarRoll[employer]be Fact  Sheet for Healthcare Providers: https://www.woods-mathews.com/ This test is not yet approved or cleared by the [place] FDA and  has been authorized for detection and/or diagnosis of SARS-CoV-2 by FDA under an Emergency Use Authorization (EUA). This EUA will remain  in effect (meaning this test can be used) for the duration of the COVID-19 declaration under Section 56 4(b)(1) of the Act, 21 U.S.C. section 360bbb-3(b)(1), unless the authorization is terminated or revoked sooner. Performed at [facility], [place] [address] [place], [place] [address]      Labs: Basic Metabolic Panel: Recent Labs  Lab 10/15/19 0138 10/16/19 0611 10/17/19 0417 10/17/19 1622 10/18/19 0238  NA 130* 131* 133* 133* 132*  132*  K 3.1* 3.7 3.0* 4.1 3.8  3.8  CL 92* 92* 96* 96* 98  96*  CO2 25 27 26 26 26  25   GLUCOSE 386* 411* 350* 176* 389*  386*  BUN 19 18 15 15 13  13   CREATININE 6.01* 6.09* 5.84* 6.40* 5.74*  5.88*  CALCIUM 7.8* 8.1* 8.4* 8.3* 8.5*  8.5*  PHOS 4.2  4.3  --   --   --  3.0   Liver Function Tests: Recent Labs  Lab 10/14/19 0307 10/15/19 0138 10/18/19 0238  AST 15  --  13*  ALT 11  --  8  ALKPHOS 50  --  51  BILITOT 0.2*  --  0.2*  PROT 5.3*  --  5.8*  ALBUMIN 1.8* 1.6* 1.6*   1.6*   No results for input(s): LIPASE, AMYLASE in the last 168 hours. No results for input(s): AMMONIA in the last 168 hours. CBC: Recent Labs  Lab 10/14/19 0307 10/15/19 0138 10/16/19 0611 10/17/19 0417 10/18/19 0238  WBC 7.9 8.0 8.5 9.5 9.3  NEUTROABS 6.0 5.5 5.9 6.4 6.5  HGB 8.4* 8.2* 8.2* 8.2* 8.8*  HCT 26.0* 25.2* 26.6* 26.5* 27.9*  MCV 94.9 95.8 99.6 100.4* 99.3  PLT 204 227 232 261 328   Cardiac Enzymes: No results for input(s): CKTOTAL, CKMB, CKMBINDEX, TROPONINI in the last 168 hours. BNP: BNP (last 3 results) Recent Labs    12/05/18 0155  BNP 1,901.1*    ProBNP (last 3 results) No results for input(s): PROBNP in the last 8760 hours.  CBG: Recent Labs  Lab 10/17/19 2201 10/18/19 0227 10/18/19 0508 10/18/19 0754 10/18/19 1152  GLUCAP 403* 376* 351* 239* 107*    Principal Problem:   Sigmoid diverticulitis Active Problems:   Hypertension associated with diabetes ([facility])   Diabetes mellitus type 2 with complications ([facility])   Chronic combined systolic and diastolic CHF (congestive heart failure) ([facility])   Periumbilical hernia   ESRD on peritoneal dialysis ([facility])  Time coordinating discharge: 38 minutes.  Signed:        Dequante Tremaine, DO [facility]  10/18/2019, 6:10 PM

## 2019-10-18 NOTE — ED Triage Notes (Signed)
Patient arrived with EMS from home reports persistent LLQ pain for several days , discharged from hospital today diagnosed with diverticulitis , no emesis or diarrhea , denies fever or chills .

## 2019-10-18 NOTE — Progress Notes (Signed)
Physical Therapy Note  Patient suffers from sigmoid diverticulitis, with generalized weakness,  which impairs their ability to perform daily activities like walking, standing, general mobility and balance in the home.  A walker alone will not resolve the issues with performing activities of daily living. A wheelchair will allow patient to safely perform daily activities.  The patient can self propel in the home or has a caregiver who can provide assistance.     Roney Marion, [place]  Acute Rehabilitation Services Pager [phone] Office [phone]

## 2019-10-19 ENCOUNTER — Ambulatory Visit: Payer: Medicare Other

## 2019-10-19 ENCOUNTER — Emergency Department (HOSPITAL_COMMUNITY): Payer: Medicare HMO

## 2019-10-19 DIAGNOSIS — Z992 Dependence on renal dialysis: Secondary | ICD-10-CM | POA: Diagnosis not present

## 2019-10-19 DIAGNOSIS — K5732 Diverticulitis of large intestine without perforation or abscess without bleeding: Secondary | ICD-10-CM | POA: Diagnosis not present

## 2019-10-19 DIAGNOSIS — N2581 Secondary hyperparathyroidism of renal origin: Secondary | ICD-10-CM | POA: Diagnosis not present

## 2019-10-19 DIAGNOSIS — N186 End stage renal disease: Secondary | ICD-10-CM | POA: Diagnosis not present

## 2019-10-19 NOTE — Discharge Instructions (Addendum)
Continue to take your antibiotic Augmentin for the diverticulitis.  Continue to follow-up and take medications as per your discharge from the hospital yesterday.  Return for any new or worse symptoms.  Internal medicine cleared you for discharge home today.

## 2019-10-19 NOTE — Progress Notes (Signed)
Progress Note   Patricia [name] LZJ:[id number] DOB: 1955-12-07 DOA: 10/18/2019  PCP: Ladell Pier, MD   HPI: Patricia [name] is a 64 y.o. female with medical history significant of HTN; DM; ESRD on PD; chronic combined CHF; and recent admission for diverticulitis who was reportedly very upset to be "pushed out of the hospital" yesterday.   She returned today with persistent LLQ pain, unchanged, no n/v/d, no fever or chills.  ED Course:  Consult - discharged yesterday for diverticulitis.  PD nurse tried to hook her up, had pain,  Labs unchanged, CT looks like slowly improving and not worse diverticulitis.  On PO antibiotics.  Patient thinks she needs to be readmitted.  She likely is able to go home.     Past Medical History:  Diagnosis Date  . Allergy   . Anemia   . Anxiety   . Blood transfusion without reported diagnosis   . Cardiomyopathy ([place]) 11/2016   no ischemic eval due to CKD  . CHF (congestive heart failure) ([place])   . CKD (chronic kidney disease), stage IV ([place])   . Depression   . Diabetes mellitus without complication ([place])    type 2  . Diverticulitis   . Dyspnea   . Hypertension   . Obesity     Past Surgical History:  Procedure Laterality Date  . ABDOMINAL HYSTERECTOMY    . BREAST SURGERY     reduction  . [place]  . LAPAROSCOPY N/A 06/16/2019   Procedure: primary closure of umbilical hernia;  Surgeon: Ralene Ok, MD;  Location: [facility];  Service: General;  Laterality: N/A;  . LIPOMA EXCISION     back  Dr. Excell Seltzer 03-22-18  . LIPOMA EXCISION N/A 03/22/2018   Procedure: EXCISION OF BACK LIPOMA;  Surgeon: Excell Seltzer, MD;  Location: [facility];  Service: General;  Laterality: N/A;  . REDUCTION MAMMAPLASTY Bilateral     Social History   Socioeconomic History  . Marital status: Divorced    Spouse name: Not on file  . Number of children: 2  . Years of education: Not on file  . Highest education level: Not on file  Occupational History   . Occupation: disabled  Tobacco Use  . Smoking status: Never Smoker  . Smokeless tobacco: Never Used  Substance and Sexual Activity  . Alcohol use: No  . Drug use: No  . Sexual activity: Not Currently  Other Topics Concern  . Not on file  Social History Narrative  . Not on file   Social Determinants of Health   Financial Resource Strain:   . Difficulty of Paying Living Expenses: Not on file  Food Insecurity:   . Worried About Charity fundraiser in the Last Year: Not on file  . Ran Out of Food in the Last Year: Not on file  Transportation Needs:   . Lack of Transportation (Medical): Not on file  . Lack of Transportation (Non-Medical): Not on file  Physical Activity:   . Days of Exercise per Week: Not on file  . Minutes of Exercise per Session: Not on file  Stress:   . Feeling of Stress : Not on file  Social Connections:   . Frequency of Communication with Friends and Family: Not on file  . Frequency of Social Gatherings with Friends and Family: Not on file  . Attends Religious Services: Not on file  . Active Member of Clubs or Organizations: Not on file  . Attends Archivist  Meetings: Not on file  . Marital Status: Not on file  Intimate Partner Violence:   . Fear of Current or Ex-Partner: Not on file  . Emotionally Abused: Not on file  . Physically Abused: Not on file  . Sexually Abused: Not on file    Allergies  Allergen Reactions  . Codone [Hydrocodone] Itching  . Norvasc [Amlodipine Besylate] Swelling    Lower extremity  . Hydralazine Other (See Comments) and Swelling    Leg swelling  . Sulfa Antibiotics Itching and Rash    Family History  Problem Relation Age of Onset  . Diabetes Brother   . Stomach cancer Brother   . Kidney disease Brother   . Heart Problems Maternal Grandmother        pacemaker  . Heart disease Maternal Grandfather   . Rectal cancer Neg Hx   . Esophageal cancer Neg Hx   . Liver cancer Neg Hx   . Colon cancer Neg Hx      Prior to Admission medications   Medication Sig Start Date End Date Taking? Authorizing Provider  ACCU-CHEK FASTCLIX LANCETS MISC Uad tid 10/21/18   Ladell Pier, MD  acetaminophen (TYLENOL) 325 MG tablet Take 2 tablets (650 mg total) by mouth every 6 (six) hours as needed. Patient taking differently: Take 650 mg by mouth every 6 (six) hours as needed for headache (pain).  05/25/19   Varney Biles, MD  albuterol (PROVENTIL HFA;VENTOLIN HFA) 108 (90 Base) MCG/ACT inhaler Inhale 1-2 puffs into the lungs every 6 (six) hours as needed for wheezing or shortness of breath. 10/11/18   Marney Setting, NP  Amino Acids-Protein Hydrolys (FEEDING SUPPLEMENT, PRO-STAT SUGAR FREE 64,) LIQD Take 30 mLs by mouth 2 (two) times daily. 10/18/19   Swayze, Ava, DO  amoxicillin-clavulanate (AUGMENTIN) 875-125 MG tablet Take 1 tablet by mouth every 12 (twelve) hours. 10/18/19   Swayze, Ava, DO  aspirin EC 81 MG tablet Take 1 tablet (81 mg total) by mouth daily. 01/04/17   Maren Reamer, MD  atorvastatin (LIPITOR) 20 MG tablet Take 1/2 (one-half) tablet by mouth once daily Patient taking differently: Take 10 mg by mouth daily.  06/12/19   Ladell Pier, MD  Blood Glucose Monitoring Suppl (ACCU-CHEK GUIDE) w/Device KIT 1 each by Does not apply route 3 (three) times daily. Use tid as directed 10/21/18   Ladell Pier, MD  calcitRIOL (ROCALTROL) 0.25 MCG capsule Take 0.25 mcg by mouth daily. 09/12/19   [provider]  carvedilol (COREG) 25 MG tablet TAKE 1 TABLET BY MOUTH TWICE DAILY WITH A MEAL Patient taking differently: Take 25 mg by mouth 2 (two) times daily with a meal.  05/04/19   Croitoru, Mihai, MD  furosemide (LASIX) 80 MG tablet Take 1 tablet (80 mg total) by mouth daily. 10/18/19   Swayze, Ava, DO  gabapentin (NEURONTIN) 100 MG capsule TAKE 1 CAPSULE (100 MG TOTAL) BY MOUTH AT BEDTIME. 12/05/18   Ladell Pier, MD  gentamicin cream (GARAMYCIN) 0.1 % Apply 1 application topically  See admin instructions. Apply topically to dialysis site after showering - every other day 06/10/19   [provider]  glucose blood (ACCU-CHEK GUIDE) test strip Use as instructed tid 10/21/18   Ladell Pier, MD  hydrALAZINE (APRESOLINE) 100 MG tablet Take 1 tablet (100 mg total) by mouth 2 (two) times daily. 06/20/19 10/10/19  [place] Donnamarie Poag, DO  insulin aspart (NOVOLOG) 100 UNIT/ML injection 3 units before meals. Patient not taking: Reported on  10/10/2019 04/06/17   Ladell Pier, MD  Insulin Glargine (LANTUS SOLOSTAR) 100 UNIT/ML Solostar Pen Inject 6 Units into the skin at bedtime. 10/18/19   Swayze, Ava, DO  Insulin Pen Needle (TRUEPLUS PEN NEEDLES) 32G X 4 MM MISC Use as directed to inject insulin. 03/25/18   Ladell Pier, MD  Insulin Pen Needle 31G X 5 MM MISC Use as directed 03/28/18   Ladell Pier, MD  isosorbide mononitrate (IMDUR) 30 MG 24 hr tablet Take 1/2 (one-half) tablet by mouth once daily Patient taking differently: Take 15 mg by mouth daily.  06/12/19   Croitoru, Mihai, MD  lisinopril (ZESTRIL) 40 MG tablet Take 40 mg by mouth daily. 09/19/19   [provider]  metroNIDAZOLE (FLAGYL) 500 MG tablet Take 1 tablet (500 mg total) by mouth every 8 (eight) hours. 10/18/19   Swayze, Ava, DO  oxyCODONE (OXY IR/ROXICODONE) 5 MG immediate release tablet Take 1 tablet (5 mg total) by mouth every 4 (four) hours as needed for moderate pain or breakthrough pain (Hold & Call MD if SBP<90, HR<65, RR<10, O2<90, or altered mental status.). 10/18/19   Swayze, Ava, DO  Polyethyl Glycol-Propyl Glycol (SYSTANE) 0.4-0.3 % SOLN Place 1 drop into both eyes 3 (three) times daily as needed (dry eyes).     [provider]  sevelamer (RENAGEL) 800 MG tablet Take 1,600-3,200 mg by mouth See admin instructions. Take 2-4 tablets (1600-3200 mg) by mouth up to three times daily with meals - 2-3 tablets with small meal, 4 tablets with large meal    [provider]   spironolactone (ALDACTONE) 25 MG tablet Take 25 mg by mouth daily. 09/19/19   [provider]    Physical Exam: Vitals:   10/19/19 0930 10/19/19 1015 10/19/19 1100 10/19/19 1145  BP: (!) 175/75 (!) 147/71 (!) 187/75 (!) 205/78  Pulse: 83 80 81 84  Resp:  18 18 20   Temp:      TempSrc:      SpO2: 99% 96% 97% 99%  Weight:      Height:          Radiological Exams on Admission: CT Abdomen Pelvis Wo Contrast  Result Date: 10/19/2019 CLINICAL DATA:  Left lower quadrant abdominal pain for several days. Recent diagnosis of sigmoid diverticulitis. EXAM: CT ABDOMEN AND PELVIS WITHOUT CONTRAST TECHNIQUE: Multidetector CT imaging of the abdomen and pelvis was performed following the standard protocol without IV contrast. COMPARISON:  10/10/2019 CT abdomen/pelvis. FINDINGS: Lower chest: Trace dependent right pleural effusion. Mild dependent atelectasis at both lung bases. Coronary atherosclerosis. Hepatobiliary: Normal liver size. No liver mass. Cholelithiasis. No definite gallbladder wall thickening. No biliary ductal dilatation. Pancreas: Normal, with no mass or duct dilation. Spleen: Normal size. No mass. Adrenals/Urinary Tract: Normal adrenals. No renal stones. No hydronephrosis. No contour deforming renal masses. Normal moderately distended bladder. Stomach/Bowel: Small hiatal hernia. Otherwise normal nondistended stomach. Moderate umbilical hernia contains small bowel loops, not substantially changed. No small bowel dilatation, focal small bowel caliber transition or small bowel wall thickening. Normal appendix. Mild diffuse colonic diverticulosis. There is a long approximately 10 cm in length segment of proximal to mid sigmoid colon with circumferential wall thickening with associated pericolonic fat stranding, stable to slightly improved compared to the 10/10/2019 CT study. No new sites of large bowel wall thickening. Vascular/Lymphatic: Atherosclerotic nonaneurysmal abdominal aorta. No  pathologically enlarged lymph nodes in the abdomen or pelvis. Reproductive: Status post hysterectomy, with no abnormal findings at the vaginal cuff. No adnexal mass.  Other: Percutaneous peritoneal dialysis catheter terminates in the anterior left lower quadrant. Stable tubular fluid collection in the subcutaneous right ventral lower abdominal wall surrounding the peritoneal dialysis catheter, not substantially changed. Tiny focus of gas within the non dependent superior portion of this collection (series 3/image 57). Small volume perihepatic and right lower quadrant free fluid. No focal intraperitoneal fluid collections. No free intraperitoneal gas. Musculoskeletal: No aggressive appearing focal osseous lesions. Mild lower lumbar spondylosis. IMPRESSION: 1. Persistent segmental wall thickening with surrounding inflammatory changes in the proximal to mid sigmoid colon, stable to slightly improved since 10/10/2019 CT. Findings presumably represent slowly resolving sigmoid diverticulitis given underlying mild diffuse colonic diverticulosis. No free intraperitoneal air. No discrete pericolonic abscess on this noncontrast scan. Consider post treatment CT abdomen/pelvis to document resolution and/or outpatient colonoscopy correlation when clinically feasible if not recently performed. 2. Trace dependent right pleural effusion, new. 3. Moderate umbilical hernia containing small bowel loops, without acute bowel complication. 4. Small hiatal hernia. 5. Cholelithiasis. 6.  Aortic Atherosclerosis (ICD10-I70.0). Electronically Signed   By: Ilona Sorrel M.D.   On: 10/19/2019 12:34    Labs on Admission: I have personally reviewed the available labs and imaging studies at the time of the admission.  Pertinent labs:   Na++ 133 Glucose 228 BUN 18/Creatinine 6.49/GFR 7 Albumin 1.8 WBC 10.7; 9.3 on 1/6 Hgb 9.6; 8.8 on 1/6 UA: >500 glucose, trace LE, 100 protein, rare bacteria   Assessment/Plan Principal Problem:    Sigmoid diverticulitis Active Problems:   ESRD on peritoneal dialysis ([place])   Diverticulitis -Patient discharged yesterday after bout of diverticulitis -Returned last night with ongoing pain -No fever, generally stable WBC count -CT looks like she is gradually improving -Agree with EDP that patient does not appear to require repeat hospitalization at this time  ESRD on PD -Continue PD as an outpatient    Karmen Bongo MD [facility]   How to contact the [facility] Attending or Consulting provider [place] or covering provider during after hours [place], for this patient?  1. Check the care team in [facility] and look for a) attending/consulting [facility] provider listed and b) the [facility] team listed 2. Log into [email or web address] and use [place] universal password to access. If you do not have the password, please contact the hospital operator. 3. Locate the [facility] provider you are looking for under [facility] and page to a number that you can be directly reached. 4. If you still have difficulty reaching the provider, please page the [facility] (Director on Call) for the Hospitalists listed on amion for assistance.   10/19/2019, 1:19 PM

## 2019-10-19 NOTE — ED Provider Notes (Signed)
[facility] EMERGENCY DEPARTMENT Provider Note   CSN: [id number] Arrival date & time: 10/18/19  2208     History Chief Complaint  Patient presents with  . Abdominal Pain    Vern M Foulks is a 64 y.o. female.  Patient just discharged from the hospital yesterday.  Patient was admitted on December 29 for left lower quadrant abdominal pain.  Patient is a peritoneal dialysis patient.  CT scan at that time was consistent with diverticulitis.  Patient was treated with antibiotics in the hospital.  Discharged home yesterday.  From review of notes patient did not feel she was ready to go home.  She got home her peritoneal dialysis nurse came and hooked her up and there was abdominal pain so she came back.  Past medical history significant for congestive heart failure the chronic kidney disease as mentioned diabetes without complications.  The diverticulitis hypertension.  Patient was discharged on Augmentin.  Patient has not taken any of that yet because she had her prescriptions filled.        Past Medical History:  Diagnosis Date  . Allergy   . Anemia   . Anxiety   . Blood transfusion without reported diagnosis   . Cardiomyopathy (HCC) 11/2016   no ischemic eval due to CKD  . CHF (congestive heart failure) (HCC)   . CKD (chronic kidney disease), stage IV (HCC)   . Depression   . Diabetes mellitus without complication (HCC)    type 2  . Diverticulitis   . Dyspnea   . Hypertension   . Obesity     Patient Active Problem List   Diagnosis Date Noted  . Sigmoid diverticulitis 10/10/2019  . ESRD on peritoneal dialysis (HCC) 07/06/2019  . Periumbilical hernia 06/16/2019  . ESRD (end stage renal disease) (HCC) 01/20/2019  . Chronic combined systolic and diastolic CHF (congestive heart failure) (HCC) 12/06/2018  . Fluid overload 12/05/2018  . Type II diabetes mellitus with renal manifestations (HCC) 12/05/2018  . Hypoglycemia 12/05/2018  . Acute on chronic combined  systolic and diastolic CHF (congestive heart failure) (HCC) 06/14/2018  . CKD (chronic kidney disease) stage 5, GFR less than 15 ml/min (HCC) 06/14/2018  . Anemia due to stage 5 chronic kidney disease, not on chronic dialysis (HCC) 06/14/2018  . Systolic CHF (HCC) 11/29/2016  . Diabetes mellitus type 2 with complications (HCC) 11/29/2016  . Transaminasemia 11/29/2016  . Cardiomyopathy (HCC) 11/12/2016  . Lipoma 10/21/2016  . CKD (chronic kidney disease) stage 4, GFR 15-29 ml/min (HCC) 08/10/2016  . Anxiety and depression 12/12/2015  . Diabetic nephropathy associated with type 2 diabetes mellitus (HCC) 11/15/2014  . Moderate nonproliferative diabetic retinopathy(362.05) 04/17/2014  . 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 Hypertension associated with diabetes (HCC) 03/28/2014  . Dental caries 03/28/2014  . Dyslipidemia 04/18/2013    Past Surgical History:  Procedure Laterality Date  . ABDOMINAL HYSTERECTOMY    . BREAST SURGERY     reduction  . CESAREAN SECTION  1982, 1988  . LAPAROSCOPY N/A 06/16/2019   Procedure: primary closure of umbilical hernia;  Surgeon: Ramirez, Armando, MD;  Location: [facility] OR;  Service: General;  Laterality: N/A;  . LIPOMA EXCISION     back  Dr. Hoxworth 03-22-18  . LIPOMA EXCISION N/A 03/22/2018   Procedure: EXCISION OF BACK LIPOMA;  Surgeon: Hoxworth, Benjamin, MD;  Location: [facility] ORS;  Service: General;  Laterality: N/A;  . REDUCTION MAMMAPLASTY Bilateral      OB History    Gravida  2     Para  2   Term  2   Preterm      AB      Living  2     SAB      TAB      Ectopic      Multiple      Live Births              Family History  Problem Relation Age of Onset  . Diabetes Brother   . Stomach cancer Brother   . Kidney disease Brother   . Heart Problems Maternal Grandmother        pacemaker  . Heart disease Maternal Grandfather   . Rectal cancer Neg Hx   . Esophageal cancer Neg Hx   . Liver cancer Neg Hx   . Colon cancer Neg Hx      Social History   Tobacco Use  . Smoking status: Never Smoker  . Smokeless tobacco: Never Used  Substance Use Topics  . Alcohol use: No  . Drug use: No    Home Medications Prior to Admission medications   Medication Sig Start Date End Date Taking? Authorizing Provider  ACCU-CHEK FASTCLIX LANCETS MISC Uad tid 10/21/18   Johnson, Deborah B, MD  acetaminophen (TYLENOL) 325 MG tablet Take 2 tablets (650 mg total) by mouth every 6 (six) hours as needed. Patient taking differently: Take 650 mg by mouth every 6 (six) hours as needed for headache (pain).  05/25/19   Nanavati, Ankit, MD  albuterol (PROVENTIL HFA;VENTOLIN HFA) 108 (90 Base) MCG/ACT inhaler Inhale 1-2 puffs into the lungs every 6 (six) hours as needed for wheezing or shortness of breath. 10/11/18   Mitchell, Melanie L, NP  Amino Acids-Protein Hydrolys (FEEDING SUPPLEMENT, PRO-STAT SUGAR FREE 64,) LIQD Take 30 mLs by mouth 2 (two) times daily. 10/18/19   Swayze, Ava, DO  amoxicillin-clavulanate (AUGMENTIN) 875-125 MG tablet Take 1 tablet by mouth every 12 (twelve) hours. 10/18/19   Swayze, Ava, DO  aspirin EC 81 MG tablet Take 1 tablet (81 mg total) by mouth daily. 01/04/17   Langeland, Dawn T, MD  atorvastatin (LIPITOR) 20 MG tablet Take 1/2 (one-half) tablet by mouth once daily Patient taking differently: Take 10 mg by mouth daily.  06/12/19   Johnson, Deborah B, MD  Blood Glucose Monitoring Suppl (ACCU-CHEK GUIDE) w/Device KIT 1 each by Does not apply route 3 (three) times daily. Use tid as directed 10/21/18   Johnson, Deborah B, MD  calcitRIOL (ROCALTROL) 0.25 MCG capsule Take 0.25 mcg by mouth daily. 09/12/19   [provider]  carvedilol (COREG) 25 MG tablet TAKE 1 TABLET BY MOUTH TWICE DAILY WITH A MEAL Patient taking differently: Take 25 mg by mouth 2 (two) times daily with a meal.  05/04/19   Croitoru, Mihai, MD  furosemide (LASIX) 80 MG tablet Take 1 tablet (80 mg total) by mouth daily. 10/18/19   Swayze, Ava, DO    gabapentin (NEURONTIN) 100 MG capsule TAKE 1 CAPSULE (100 MG TOTAL) BY MOUTH AT BEDTIME. 12/05/18   Johnson, Deborah B, MD  gentamicin cream (GARAMYCIN) 0.1 % Apply 1 application topically See admin instructions. Apply topically to dialysis site after showering - every other day 06/10/19   [provider]  glucose blood (ACCU-CHEK GUIDE) test strip Use as instructed tid 10/21/18   Johnson, Deborah B, MD  hydrALAZINE (APRESOLINE) 100 MG tablet Take 1 tablet (100 mg total) by mouth 2 (two) times daily. 06/20/19 10/10/19  [place], Eric J, DO  insulin aspart (  NOVOLOG) 100 UNIT/ML injection 3 units before meals. Patient not taking: Reported on 10/10/2019 04/06/17   Ladell Pier, MD  Insulin Glargine (LANTUS SOLOSTAR) 100 UNIT/ML Solostar Pen Inject 6 Units into the skin at bedtime. 10/18/19   Swayze, Ava, DO  Insulin Pen Needle (TRUEPLUS PEN NEEDLES) 32G X 4 MM MISC Use as directed to inject insulin. 03/25/18   Ladell Pier, MD  Insulin Pen Needle 31G X 5 MM MISC Use as directed 03/28/18   Ladell Pier, MD  isosorbide mononitrate (IMDUR) 30 MG 24 hr tablet Take 1/2 (one-half) tablet by mouth once daily Patient taking differently: Take 15 mg by mouth daily.  06/12/19   Croitoru, Mihai, MD  lisinopril (ZESTRIL) 40 MG tablet Take 40 mg by mouth daily. 09/19/19   [provider]  metroNIDAZOLE (FLAGYL) 500 MG tablet Take 1 tablet (500 mg total) by mouth every 8 (eight) hours. 10/18/19   Swayze, Ava, DO  oxyCODONE (OXY IR/ROXICODONE) 5 MG immediate release tablet Take 1 tablet (5 mg total) by mouth every 4 (four) hours as needed for moderate pain or breakthrough pain (Hold & Call MD if SBP<90, HR<65, RR<10, O2<90, or altered mental status.). 10/18/19   Swayze, Ava, DO  Polyethyl Glycol-Propyl Glycol (SYSTANE) 0.4-0.3 % SOLN Place 1 drop into both eyes 3 (three) times daily as needed (dry eyes).     [provider]  sevelamer (RENAGEL) 800 MG tablet Take 1,600-3,200 mg by mouth See  admin instructions. Take 2-4 tablets (1600-3200 mg) by mouth up to three times daily with meals - 2-3 tablets with small meal, 4 tablets with large meal    [provider]  spironolactone (ALDACTONE) 25 MG tablet Take 25 mg by mouth daily. 09/19/19   [provider]    Allergies    Codone [hydrocodone], Norvasc [amlodipine besylate], Hydralazine, and Sulfa antibiotics  Review of Systems   Review of Systems  Constitutional: Negative for chills and fever.  HENT: Negative for rhinorrhea and sore throat.   Eyes: Negative for visual disturbance.  Respiratory: Negative for cough and shortness of breath.   Cardiovascular: Negative for chest pain and leg swelling.  Gastrointestinal: Positive for abdominal pain. Negative for diarrhea, nausea and vomiting.  Genitourinary: Negative for dysuria.  Musculoskeletal: Negative for back pain and neck pain.  Skin: Negative for rash.  Neurological: Negative for dizziness, light-headedness and headaches.  Hematological: Does not bruise/bleed easily.  Psychiatric/Behavioral: Negative for confusion.    Physical Exam Updated Vital Signs BP (!) 205/78   Pulse 84   Temp 97.7 F (36.5 C) (Oral)   Resp 20   Ht 1.626 m (5' 4")   Wt 74.2 kg   SpO2 99%   BMI 28.08 kg/m   Physical Exam Vitals and nursing note reviewed.  Constitutional:      General: She is not in acute distress.    Appearance: Normal appearance. She is well-developed.  HENT:     Head: Normocephalic and atraumatic.  Eyes:     Extraocular Movements: Extraocular movements intact.     Conjunctiva/sclera: Conjunctivae normal.     Pupils: Pupils are equal, round, and reactive to light.  Cardiovascular:     Rate and Rhythm: Normal rate and regular rhythm.     Heart sounds: No murmur.  Pulmonary:     Effort: Pulmonary effort is normal. No respiratory distress.     Breath sounds: Normal breath sounds.  Abdominal:     Palpations: Abdomen is soft.  Tenderness: There  is no abdominal tenderness. There is no guarding.     Comments: Peritoneal dialysis catheter in place.  Musculoskeletal:        General: Normal range of motion.     Cervical back: Neck supple.  Skin:    General: Skin is warm and dry.  Neurological:     General: No focal deficit present.     Mental Status: She is alert and oriented to person, place, and time.     ED Results / Procedures / Treatments   Labs (all labs ordered are listed, but only abnormal results are displayed) Labs Reviewed  COMPREHENSIVE METABOLIC PANEL - Abnormal; Notable for the following components:      Result Value   Sodium 133 (*)    Chloride 95 (*)    Glucose, Bld 228 (*)    Creatinine, Ser 6.49 (*)    Total Protein 5.9 (*)    Albumin 1.8 (*)    AST 14 (*)    Total Bilirubin 0.1 (*)    GFR calc non Af Amer 6 (*)    GFR calc Af Amer 7 (*)    All other components within normal limits  CBC - Abnormal; Notable for the following components:   WBC 10.7 (*)    RBC 3.12 (*)    Hemoglobin 9.6 (*)    HCT 32.4 (*)    MCV 103.8 (*)    MCHC 29.6 (*)    All other components within normal limits  URINALYSIS, ROUTINE W REFLEX MICROSCOPIC - Abnormal; Notable for the following components:   APPearance HAZY (*)    Glucose, UA >=500 (*)    Protein, ur 100 (*)    Leukocytes,Ua TRACE (*)    Bacteria, UA RARE (*)    All other components within normal limits  LIPASE, BLOOD    EKG EKG Interpretation  Date/Time:  Wednesday October 18 2019 22:12:16 EST Ventricular Rate:  94 PR Interval:  140 QRS Duration: 84 QT Interval:  368 QTC Calculation: 460 R Axis:   47 Text Interpretation: Normal sinus rhythm Normal ECG Confirmed by Fredia Sorrow [phone]) on 10/19/2019 8:38:09 AM   Radiology CT Abdomen Pelvis Wo Contrast  Result Date: 10/19/2019 CLINICAL DATA:  Left lower quadrant abdominal pain for several days. Recent diagnosis of sigmoid diverticulitis. EXAM: CT ABDOMEN AND PELVIS WITHOUT CONTRAST TECHNIQUE:  Multidetector CT imaging of the abdomen and pelvis was performed following the standard protocol without IV contrast. COMPARISON:  10/10/2019 CT abdomen/pelvis. FINDINGS: Lower chest: Trace dependent right pleural effusion. Mild dependent atelectasis at both lung bases. Coronary atherosclerosis. Hepatobiliary: Normal liver size. No liver mass. Cholelithiasis. No definite gallbladder wall thickening. No biliary ductal dilatation. Pancreas: Normal, with no mass or duct dilation. Spleen: Normal size. No mass. Adrenals/Urinary Tract: Normal adrenals. No renal stones. No hydronephrosis. No contour deforming renal masses. Normal moderately distended bladder. Stomach/Bowel: Small hiatal hernia. Otherwise normal nondistended stomach. Moderate umbilical hernia contains small bowel loops, not substantially changed. No small bowel dilatation, focal small bowel caliber transition or small bowel wall thickening. Normal appendix. Mild diffuse colonic diverticulosis. There is a long approximately 10 cm in length segment of proximal to mid sigmoid colon with circumferential wall thickening with associated pericolonic fat stranding, stable to slightly improved compared to the 10/10/2019 CT study. No new sites of large bowel wall thickening. Vascular/Lymphatic: Atherosclerotic nonaneurysmal abdominal aorta. No pathologically enlarged lymph nodes in the abdomen or pelvis. Reproductive: Status post hysterectomy, with no abnormal findings at the vaginal cuff.  No adnexal mass. Other: Percutaneous peritoneal dialysis catheter terminates in the anterior left lower quadrant. Stable tubular fluid collection in the subcutaneous right ventral lower abdominal wall surrounding the peritoneal dialysis catheter, not substantially changed. Tiny focus of gas within the non dependent superior portion of this collection (series 3/image 57). Small volume perihepatic and right lower quadrant free fluid. No focal intraperitoneal fluid collections. No  free intraperitoneal gas. Musculoskeletal: No aggressive appearing focal osseous lesions. Mild lower lumbar spondylosis. IMPRESSION: 1. Persistent segmental wall thickening with surrounding inflammatory changes in the proximal to mid sigmoid colon, stable to slightly improved since 10/10/2019 CT. Findings presumably represent slowly resolving sigmoid diverticulitis given underlying mild diffuse colonic diverticulosis. No free intraperitoneal air. No discrete pericolonic abscess on this noncontrast scan. Consider post treatment CT abdomen/pelvis to document resolution and/or outpatient colonoscopy correlation when clinically feasible if not recently performed. 2. Trace dependent right pleural effusion, new. 3. Moderate umbilical hernia containing small bowel loops, without acute bowel complication. 4. Small hiatal hernia. 5. Cholelithiasis. 6.  Aortic Atherosclerosis (ICD10-I70.0). Electronically Signed   By: Jason A Poff M.D.   On: 10/19/2019 12:34    Procedures Procedures (including critical care time)  Medications Ordered in ED Medications  sodium chloride flush (NS) 0.9 % injection 3 mL (3 mLs Intravenous Not Given 10/19/19 0740)    ED Course  I have reviewed the triage vital signs and the nursing notes.  Pertinent labs & imaging results that were available during my care of the patient were reviewed by me and considered in my medical decision making (see chart for details).    MDM Rules/Calculators/A&P                      Discussed with the [facility] hospitalist.  They have cleared you for discharge home.  CT scan today showed persistent diverticulitis but no worse.  Labs without any significant complicating factor.   Patient will be discharged home as she was from the hospitalization to continue to take the Augmentin and all other medications.  Patient's abdomen without any significant tenderness.  CT scan results as discussed above.  Discussed with Jennifer Yates from [facility] hospitalist.   Patient cleared for discharge home.  No acute complicating factors.  Final Clinical Impression(s) / ED Diagnoses Final diagnoses:  Diverticulitis    Rx / DC Orders ED Discharge Orders    None       , , MD 10/19/19 1312  

## 2019-10-19 NOTE — ED Notes (Signed)
Patient transported to CT 

## 2019-10-20 DIAGNOSIS — N2581 Secondary hyperparathyroidism of renal origin: Secondary | ICD-10-CM | POA: Diagnosis not present

## 2019-10-20 DIAGNOSIS — N186 End stage renal disease: Secondary | ICD-10-CM | POA: Diagnosis not present

## 2019-10-20 DIAGNOSIS — Z992 Dependence on renal dialysis: Secondary | ICD-10-CM | POA: Diagnosis not present

## 2019-10-21 DIAGNOSIS — I5042 Chronic combined systolic (congestive) and diastolic (congestive) heart failure: Secondary | ICD-10-CM | POA: Diagnosis not present

## 2019-10-21 DIAGNOSIS — N186 End stage renal disease: Secondary | ICD-10-CM | POA: Diagnosis not present

## 2019-10-21 DIAGNOSIS — D631 Anemia in chronic kidney disease: Secondary | ICD-10-CM | POA: Diagnosis not present

## 2019-10-21 DIAGNOSIS — E785 Hyperlipidemia, unspecified: Secondary | ICD-10-CM | POA: Diagnosis not present

## 2019-10-21 DIAGNOSIS — Z992 Dependence on renal dialysis: Secondary | ICD-10-CM | POA: Diagnosis not present

## 2019-10-21 DIAGNOSIS — N2581 Secondary hyperparathyroidism of renal origin: Secondary | ICD-10-CM | POA: Diagnosis not present

## 2019-10-21 DIAGNOSIS — K5732 Diverticulitis of large intestine without perforation or abscess without bleeding: Secondary | ICD-10-CM | POA: Diagnosis not present

## 2019-10-21 DIAGNOSIS — I132 Hypertensive heart and chronic kidney disease with heart failure and with stage 5 chronic kidney disease, or end stage renal disease: Secondary | ICD-10-CM | POA: Diagnosis not present

## 2019-10-21 DIAGNOSIS — E1122 Type 2 diabetes mellitus with diabetic chronic kidney disease: Secondary | ICD-10-CM | POA: Diagnosis not present

## 2019-10-21 DIAGNOSIS — K429 Umbilical hernia without obstruction or gangrene: Secondary | ICD-10-CM | POA: Diagnosis not present

## 2019-10-21 DIAGNOSIS — E1165 Type 2 diabetes mellitus with hyperglycemia: Secondary | ICD-10-CM | POA: Diagnosis not present

## 2019-10-22 DIAGNOSIS — N186 End stage renal disease: Secondary | ICD-10-CM | POA: Diagnosis not present

## 2019-10-22 DIAGNOSIS — N2581 Secondary hyperparathyroidism of renal origin: Secondary | ICD-10-CM | POA: Diagnosis not present

## 2019-10-22 DIAGNOSIS — Z992 Dependence on renal dialysis: Secondary | ICD-10-CM | POA: Diagnosis not present

## 2019-10-23 DIAGNOSIS — Z992 Dependence on renal dialysis: Secondary | ICD-10-CM | POA: Diagnosis not present

## 2019-10-23 DIAGNOSIS — N186 End stage renal disease: Secondary | ICD-10-CM | POA: Diagnosis not present

## 2019-10-23 DIAGNOSIS — N2581 Secondary hyperparathyroidism of renal origin: Secondary | ICD-10-CM | POA: Diagnosis not present

## 2019-10-24 DIAGNOSIS — N186 End stage renal disease: Secondary | ICD-10-CM | POA: Diagnosis not present

## 2019-10-24 DIAGNOSIS — Z992 Dependence on renal dialysis: Secondary | ICD-10-CM | POA: Diagnosis not present

## 2019-10-24 DIAGNOSIS — N2581 Secondary hyperparathyroidism of renal origin: Secondary | ICD-10-CM | POA: Diagnosis not present

## 2019-10-25 DIAGNOSIS — N186 End stage renal disease: Secondary | ICD-10-CM | POA: Diagnosis not present

## 2019-10-25 DIAGNOSIS — Z992 Dependence on renal dialysis: Secondary | ICD-10-CM | POA: Diagnosis not present

## 2019-10-25 DIAGNOSIS — N2581 Secondary hyperparathyroidism of renal origin: Secondary | ICD-10-CM | POA: Diagnosis not present

## 2019-10-26 DIAGNOSIS — N2581 Secondary hyperparathyroidism of renal origin: Secondary | ICD-10-CM | POA: Diagnosis not present

## 2019-10-26 DIAGNOSIS — N186 End stage renal disease: Secondary | ICD-10-CM | POA: Diagnosis not present

## 2019-10-26 DIAGNOSIS — Z992 Dependence on renal dialysis: Secondary | ICD-10-CM | POA: Diagnosis not present

## 2019-10-27 DIAGNOSIS — N2581 Secondary hyperparathyroidism of renal origin: Secondary | ICD-10-CM | POA: Diagnosis not present

## 2019-10-27 DIAGNOSIS — N186 End stage renal disease: Secondary | ICD-10-CM | POA: Diagnosis not present

## 2019-10-27 DIAGNOSIS — Z992 Dependence on renal dialysis: Secondary | ICD-10-CM | POA: Diagnosis not present

## 2019-10-28 DIAGNOSIS — N186 End stage renal disease: Secondary | ICD-10-CM | POA: Diagnosis not present

## 2019-10-28 DIAGNOSIS — N2581 Secondary hyperparathyroidism of renal origin: Secondary | ICD-10-CM | POA: Diagnosis not present

## 2019-10-28 DIAGNOSIS — Z992 Dependence on renal dialysis: Secondary | ICD-10-CM | POA: Diagnosis not present

## 2019-10-29 DIAGNOSIS — N186 End stage renal disease: Secondary | ICD-10-CM | POA: Diagnosis not present

## 2019-10-29 DIAGNOSIS — N2581 Secondary hyperparathyroidism of renal origin: Secondary | ICD-10-CM | POA: Diagnosis not present

## 2019-10-29 DIAGNOSIS — Z992 Dependence on renal dialysis: Secondary | ICD-10-CM | POA: Diagnosis not present

## 2019-10-30 DIAGNOSIS — N186 End stage renal disease: Secondary | ICD-10-CM | POA: Diagnosis not present

## 2019-10-30 DIAGNOSIS — Z992 Dependence on renal dialysis: Secondary | ICD-10-CM | POA: Diagnosis not present

## 2019-10-30 DIAGNOSIS — N2581 Secondary hyperparathyroidism of renal origin: Secondary | ICD-10-CM | POA: Diagnosis not present

## 2019-10-31 DIAGNOSIS — N2581 Secondary hyperparathyroidism of renal origin: Secondary | ICD-10-CM | POA: Diagnosis not present

## 2019-10-31 DIAGNOSIS — Z992 Dependence on renal dialysis: Secondary | ICD-10-CM | POA: Diagnosis not present

## 2019-10-31 DIAGNOSIS — N186 End stage renal disease: Secondary | ICD-10-CM | POA: Diagnosis not present

## 2019-11-01 DIAGNOSIS — N2581 Secondary hyperparathyroidism of renal origin: Secondary | ICD-10-CM | POA: Diagnosis not present

## 2019-11-01 DIAGNOSIS — Z992 Dependence on renal dialysis: Secondary | ICD-10-CM | POA: Diagnosis not present

## 2019-11-01 DIAGNOSIS — N186 End stage renal disease: Secondary | ICD-10-CM | POA: Diagnosis not present

## 2019-11-02 ENCOUNTER — Other Ambulatory Visit: Payer: Self-pay

## 2019-11-02 ENCOUNTER — Other Ambulatory Visit: Payer: Self-pay | Admitting: General Surgery

## 2019-11-02 ENCOUNTER — Other Ambulatory Visit (HOSPITAL_COMMUNITY): Payer: Self-pay | Admitting: Speech-Language Pathologist

## 2019-11-02 ENCOUNTER — Other Ambulatory Visit: Payer: Self-pay | Admitting: Speech-Language Pathologist

## 2019-11-02 ENCOUNTER — Ambulatory Visit (HOSPITAL_COMMUNITY)
Admission: RE | Admit: 2019-11-02 | Discharge: 2019-11-02 | Disposition: A | Discharge status: Home / Self Care | Payer: Medicare HMO | Source: Ambulatory Visit | Attending: General Surgery | Admitting: General Surgery

## 2019-11-02 DIAGNOSIS — K8020 Calculus of gallbladder without cholecystitis without obstruction: Secondary | ICD-10-CM | POA: Diagnosis not present

## 2019-11-02 DIAGNOSIS — K5792 Diverticulitis of intestine, part unspecified, without perforation or abscess without bleeding: Secondary | ICD-10-CM

## 2019-11-02 DIAGNOSIS — N186 End stage renal disease: Secondary | ICD-10-CM | POA: Diagnosis not present

## 2019-11-02 DIAGNOSIS — K429 Umbilical hernia without obstruction or gangrene: Secondary | ICD-10-CM | POA: Diagnosis not present

## 2019-11-02 DIAGNOSIS — K5732 Diverticulitis of large intestine without perforation or abscess without bleeding: Secondary | ICD-10-CM | POA: Diagnosis not present

## 2019-11-02 DIAGNOSIS — N2581 Secondary hyperparathyroidism of renal origin: Secondary | ICD-10-CM | POA: Diagnosis not present

## 2019-11-02 DIAGNOSIS — Z992 Dependence on renal dialysis: Secondary | ICD-10-CM | POA: Diagnosis not present

## 2019-11-02 MED ORDER — IOHEXOL 9 MG/ML PO SOLN
ORAL | Status: AC
  Filled 2019-11-02: qty 500

## 2019-11-03 DIAGNOSIS — N2581 Secondary hyperparathyroidism of renal origin: Secondary | ICD-10-CM | POA: Diagnosis not present

## 2019-11-03 DIAGNOSIS — N186 End stage renal disease: Secondary | ICD-10-CM | POA: Diagnosis not present

## 2019-11-03 DIAGNOSIS — Z992 Dependence on renal dialysis: Secondary | ICD-10-CM | POA: Diagnosis not present

## 2019-11-04 DIAGNOSIS — N2581 Secondary hyperparathyroidism of renal origin: Secondary | ICD-10-CM | POA: Diagnosis not present

## 2019-11-04 DIAGNOSIS — Z992 Dependence on renal dialysis: Secondary | ICD-10-CM | POA: Diagnosis not present

## 2019-11-04 DIAGNOSIS — N186 End stage renal disease: Secondary | ICD-10-CM | POA: Diagnosis not present

## 2019-11-05 DIAGNOSIS — N2581 Secondary hyperparathyroidism of renal origin: Secondary | ICD-10-CM | POA: Diagnosis not present

## 2019-11-05 DIAGNOSIS — Z992 Dependence on renal dialysis: Secondary | ICD-10-CM | POA: Diagnosis not present

## 2019-11-05 DIAGNOSIS — N186 End stage renal disease: Secondary | ICD-10-CM | POA: Diagnosis not present

## 2019-11-06 DIAGNOSIS — N186 End stage renal disease: Secondary | ICD-10-CM | POA: Diagnosis not present

## 2019-11-06 DIAGNOSIS — N2581 Secondary hyperparathyroidism of renal origin: Secondary | ICD-10-CM | POA: Diagnosis not present

## 2019-11-06 DIAGNOSIS — Z992 Dependence on renal dialysis: Secondary | ICD-10-CM | POA: Diagnosis not present

## 2019-11-07 DIAGNOSIS — Z992 Dependence on renal dialysis: Secondary | ICD-10-CM | POA: Diagnosis not present

## 2019-11-07 DIAGNOSIS — N2581 Secondary hyperparathyroidism of renal origin: Secondary | ICD-10-CM | POA: Diagnosis not present

## 2019-11-07 DIAGNOSIS — N186 End stage renal disease: Secondary | ICD-10-CM | POA: Diagnosis not present

## 2019-11-08 DIAGNOSIS — Z992 Dependence on renal dialysis: Secondary | ICD-10-CM | POA: Diagnosis not present

## 2019-11-08 DIAGNOSIS — N186 End stage renal disease: Secondary | ICD-10-CM | POA: Diagnosis not present

## 2019-11-08 DIAGNOSIS — N2581 Secondary hyperparathyroidism of renal origin: Secondary | ICD-10-CM | POA: Diagnosis not present

## 2019-11-09 DIAGNOSIS — N186 End stage renal disease: Secondary | ICD-10-CM | POA: Diagnosis not present

## 2019-11-09 DIAGNOSIS — N2581 Secondary hyperparathyroidism of renal origin: Secondary | ICD-10-CM | POA: Diagnosis not present

## 2019-11-09 DIAGNOSIS — Z992 Dependence on renal dialysis: Secondary | ICD-10-CM | POA: Diagnosis not present

## 2019-11-10 DIAGNOSIS — Z992 Dependence on renal dialysis: Secondary | ICD-10-CM | POA: Diagnosis not present

## 2019-11-10 DIAGNOSIS — N2581 Secondary hyperparathyroidism of renal origin: Secondary | ICD-10-CM | POA: Diagnosis not present

## 2019-11-10 DIAGNOSIS — N185 Chronic kidney disease, stage 5: Secondary | ICD-10-CM | POA: Diagnosis not present

## 2019-11-10 DIAGNOSIS — K420 Umbilical hernia with obstruction, without gangrene: Secondary | ICD-10-CM | POA: Diagnosis not present

## 2019-11-10 DIAGNOSIS — I509 Heart failure, unspecified: Secondary | ICD-10-CM | POA: Diagnosis not present

## 2019-11-10 DIAGNOSIS — E1122 Type 2 diabetes mellitus with diabetic chronic kidney disease: Secondary | ICD-10-CM | POA: Diagnosis not present

## 2019-11-10 DIAGNOSIS — N186 End stage renal disease: Secondary | ICD-10-CM | POA: Diagnosis not present

## 2019-11-10 DIAGNOSIS — I132 Hypertensive heart and chronic kidney disease with heart failure and with stage 5 chronic kidney disease, or end stage renal disease: Secondary | ICD-10-CM | POA: Diagnosis not present

## 2019-11-10 DIAGNOSIS — K5732 Diverticulitis of large intestine without perforation or abscess without bleeding: Secondary | ICD-10-CM | POA: Diagnosis not present

## 2019-11-11 DIAGNOSIS — N186 End stage renal disease: Secondary | ICD-10-CM | POA: Diagnosis not present

## 2019-11-11 DIAGNOSIS — N2581 Secondary hyperparathyroidism of renal origin: Secondary | ICD-10-CM | POA: Diagnosis not present

## 2019-11-11 DIAGNOSIS — Z992 Dependence on renal dialysis: Secondary | ICD-10-CM | POA: Diagnosis not present

## 2019-11-12 DIAGNOSIS — N186 End stage renal disease: Secondary | ICD-10-CM | POA: Diagnosis not present

## 2019-11-12 DIAGNOSIS — Z992 Dependence on renal dialysis: Secondary | ICD-10-CM | POA: Diagnosis not present

## 2019-11-12 DIAGNOSIS — N2581 Secondary hyperparathyroidism of renal origin: Secondary | ICD-10-CM | POA: Diagnosis not present

## 2019-11-12 DIAGNOSIS — E1122 Type 2 diabetes mellitus with diabetic chronic kidney disease: Secondary | ICD-10-CM | POA: Diagnosis not present

## 2019-11-13 DIAGNOSIS — N2581 Secondary hyperparathyroidism of renal origin: Secondary | ICD-10-CM | POA: Diagnosis not present

## 2019-11-13 DIAGNOSIS — N186 End stage renal disease: Secondary | ICD-10-CM | POA: Diagnosis not present

## 2019-11-13 DIAGNOSIS — Z992 Dependence on renal dialysis: Secondary | ICD-10-CM | POA: Diagnosis not present

## 2019-11-13 MED FILL — GABAPENTIN 100 MG CAPSULE: 100 | 30 days supply | Qty: 30 | Fill #4

## 2019-11-13 MED FILL — ESCITALOPRAM 20 MG TABLET: 20 | 30 days supply | Qty: 30 | Fill #2

## 2019-11-14 DIAGNOSIS — N186 End stage renal disease: Secondary | ICD-10-CM | POA: Diagnosis not present

## 2019-11-14 DIAGNOSIS — Z992 Dependence on renal dialysis: Secondary | ICD-10-CM | POA: Diagnosis not present

## 2019-11-14 DIAGNOSIS — N2581 Secondary hyperparathyroidism of renal origin: Secondary | ICD-10-CM | POA: Diagnosis not present

## 2019-11-15 DIAGNOSIS — Z992 Dependence on renal dialysis: Secondary | ICD-10-CM | POA: Diagnosis not present

## 2019-11-15 DIAGNOSIS — N186 End stage renal disease: Secondary | ICD-10-CM | POA: Diagnosis not present

## 2019-11-15 DIAGNOSIS — N2581 Secondary hyperparathyroidism of renal origin: Secondary | ICD-10-CM | POA: Diagnosis not present

## 2019-11-16 ENCOUNTER — Telehealth: Payer: Self-pay

## 2019-11-16 ENCOUNTER — Ambulatory Visit: Payer: Medicare HMO | Admitting: Physician Assistant

## 2019-11-16 DIAGNOSIS — N2581 Secondary hyperparathyroidism of renal origin: Secondary | ICD-10-CM | POA: Diagnosis not present

## 2019-11-16 DIAGNOSIS — N186 End stage renal disease: Secondary | ICD-10-CM | POA: Diagnosis not present

## 2019-11-16 DIAGNOSIS — Z992 Dependence on renal dialysis: Secondary | ICD-10-CM | POA: Diagnosis not present

## 2019-11-16 NOTE — Telephone Encounter (Signed)
Call placed to [facility]# [phone], spoke to [facility] and informed her that the patient has not been seen by Dr Wynetta Emery since 06/2019 so she will not be able to sign.  Deanna said that they received authorization for the services on 10/17/2019 from [facility]. Informed her that the patient was still hospitalized at that time

## 2019-11-17 DIAGNOSIS — Z794 Long term (current) use of insulin: Secondary | ICD-10-CM | POA: Diagnosis not present

## 2019-11-17 DIAGNOSIS — K420 Umbilical hernia with obstruction, without gangrene: Secondary | ICD-10-CM | POA: Diagnosis not present

## 2019-11-17 DIAGNOSIS — Z992 Dependence on renal dialysis: Secondary | ICD-10-CM | POA: Diagnosis not present

## 2019-11-17 DIAGNOSIS — K5732 Diverticulitis of large intestine without perforation or abscess without bleeding: Secondary | ICD-10-CM | POA: Diagnosis not present

## 2019-11-17 DIAGNOSIS — N2581 Secondary hyperparathyroidism of renal origin: Secondary | ICD-10-CM | POA: Diagnosis not present

## 2019-11-17 DIAGNOSIS — E1122 Type 2 diabetes mellitus with diabetic chronic kidney disease: Secondary | ICD-10-CM | POA: Diagnosis not present

## 2019-11-17 DIAGNOSIS — N186 End stage renal disease: Secondary | ICD-10-CM | POA: Diagnosis not present

## 2019-11-18 ENCOUNTER — Emergency Department (HOSPITAL_BASED_OUTPATIENT_CLINIC_OR_DEPARTMENT_OTHER): Payer: Medicare HMO

## 2019-11-18 ENCOUNTER — Other Ambulatory Visit: Payer: Self-pay

## 2019-11-18 ENCOUNTER — Encounter (HOSPITAL_BASED_OUTPATIENT_CLINIC_OR_DEPARTMENT_OTHER): Payer: Self-pay

## 2019-11-18 ENCOUNTER — Inpatient Hospital Stay (HOSPITAL_BASED_OUTPATIENT_CLINIC_OR_DEPARTMENT_OTHER)
Admission: EM | Admit: 2019-11-18 | Discharge: 2019-11-25 | DRG: 070 | Disposition: A | Discharge status: Home Health | Payer: Medicare HMO | Attending: Internal Medicine | Admitting: Internal Medicine

## 2019-11-18 DIAGNOSIS — E1140 Type 2 diabetes mellitus with diabetic neuropathy, unspecified: Secondary | ICD-10-CM | POA: Diagnosis present

## 2019-11-18 DIAGNOSIS — Z7982 Long term (current) use of aspirin: Secondary | ICD-10-CM

## 2019-11-18 DIAGNOSIS — I132 Hypertensive heart and chronic kidney disease with heart failure and with stage 5 chronic kidney disease, or end stage renal disease: Secondary | ICD-10-CM | POA: Diagnosis present

## 2019-11-18 DIAGNOSIS — Z833 Family history of diabetes mellitus: Secondary | ICD-10-CM

## 2019-11-18 DIAGNOSIS — N186 End stage renal disease: Secondary | ICD-10-CM | POA: Diagnosis present

## 2019-11-18 DIAGNOSIS — F332 Major depressive disorder, recurrent severe without psychotic features: Secondary | ICD-10-CM | POA: Diagnosis present

## 2019-11-18 DIAGNOSIS — Z992 Dependence on renal dialysis: Secondary | ICD-10-CM

## 2019-11-18 DIAGNOSIS — I2510 Atherosclerotic heart disease of native coronary artery without angina pectoris: Secondary | ICD-10-CM | POA: Diagnosis present

## 2019-11-18 DIAGNOSIS — I10 Essential (primary) hypertension: Secondary | ICD-10-CM | POA: Diagnosis not present

## 2019-11-18 DIAGNOSIS — K429 Umbilical hernia without obstruction or gangrene: Secondary | ICD-10-CM | POA: Diagnosis present

## 2019-11-18 DIAGNOSIS — I5042 Chronic combined systolic (congestive) and diastolic (congestive) heart failure: Secondary | ICD-10-CM | POA: Diagnosis present

## 2019-11-18 DIAGNOSIS — N185 Chronic kidney disease, stage 5: Secondary | ICD-10-CM | POA: Diagnosis present

## 2019-11-18 DIAGNOSIS — E1129 Type 2 diabetes mellitus with other diabetic kidney complication: Secondary | ICD-10-CM | POA: Diagnosis present

## 2019-11-18 DIAGNOSIS — K5732 Diverticulitis of large intestine without perforation or abscess without bleeding: Secondary | ICD-10-CM | POA: Diagnosis present

## 2019-11-18 DIAGNOSIS — F323 Major depressive disorder, single episode, severe with psychotic features: Secondary | ICD-10-CM | POA: Diagnosis not present

## 2019-11-18 DIAGNOSIS — E785 Hyperlipidemia, unspecified: Secondary | ICD-10-CM | POA: Diagnosis present

## 2019-11-18 DIAGNOSIS — J302 Other seasonal allergic rhinitis: Secondary | ICD-10-CM | POA: Diagnosis present

## 2019-11-18 DIAGNOSIS — E876 Hypokalemia: Secondary | ICD-10-CM | POA: Diagnosis present

## 2019-11-18 DIAGNOSIS — I120 Hypertensive chronic kidney disease with stage 5 chronic kidney disease or end stage renal disease: Secondary | ICD-10-CM | POA: Diagnosis not present

## 2019-11-18 DIAGNOSIS — F329 Major depressive disorder, single episode, unspecified: Secondary | ICD-10-CM | POA: Diagnosis present

## 2019-11-18 DIAGNOSIS — G9341 Metabolic encephalopathy: Secondary | ICD-10-CM | POA: Diagnosis present

## 2019-11-18 DIAGNOSIS — E1122 Type 2 diabetes mellitus with diabetic chronic kidney disease: Secondary | ICD-10-CM | POA: Diagnosis present

## 2019-11-18 DIAGNOSIS — Z888 Allergy status to other drugs, medicaments and biological substances status: Secondary | ICD-10-CM

## 2019-11-18 DIAGNOSIS — D631 Anemia in chronic kidney disease: Secondary | ICD-10-CM | POA: Diagnosis present

## 2019-11-18 DIAGNOSIS — Z9049 Acquired absence of other specified parts of digestive tract: Secondary | ICD-10-CM

## 2019-11-18 DIAGNOSIS — Z20822 Contact with and (suspected) exposure to covid-19: Secondary | ICD-10-CM | POA: Diagnosis present

## 2019-11-18 DIAGNOSIS — Z882 Allergy status to sulfonamides status: Secondary | ICD-10-CM

## 2019-11-18 DIAGNOSIS — E113319 Type 2 diabetes mellitus with moderate nonproliferative diabetic retinopathy with macular edema, unspecified eye: Secondary | ICD-10-CM | POA: Diagnosis present

## 2019-11-18 DIAGNOSIS — I428 Other cardiomyopathies: Secondary | ICD-10-CM | POA: Diagnosis present

## 2019-11-18 DIAGNOSIS — Z4901 Encounter for fitting and adjustment of extracorporeal dialysis catheter: Secondary | ICD-10-CM | POA: Diagnosis not present

## 2019-11-18 DIAGNOSIS — G9340 Encephalopathy, unspecified: Secondary | ICD-10-CM

## 2019-11-18 DIAGNOSIS — Z9189 Other specified personal risk factors, not elsewhere classified: Secondary | ICD-10-CM | POA: Diagnosis not present

## 2019-11-18 DIAGNOSIS — Z79899 Other long term (current) drug therapy: Secondary | ICD-10-CM

## 2019-11-18 DIAGNOSIS — Z794 Long term (current) use of insulin: Secondary | ICD-10-CM

## 2019-11-18 DIAGNOSIS — E1159 Type 2 diabetes mellitus with other circulatory complications: Secondary | ICD-10-CM | POA: Diagnosis not present

## 2019-11-18 DIAGNOSIS — F419 Anxiety disorder, unspecified: Secondary | ICD-10-CM | POA: Diagnosis not present

## 2019-11-18 DIAGNOSIS — R531 Weakness: Secondary | ICD-10-CM | POA: Diagnosis not present

## 2019-11-18 DIAGNOSIS — I152 Hypertension secondary to endocrine disorders: Secondary | ICD-10-CM | POA: Diagnosis present

## 2019-11-18 DIAGNOSIS — N2581 Secondary hyperparathyroidism of renal origin: Secondary | ICD-10-CM | POA: Diagnosis present

## 2019-11-18 DIAGNOSIS — E1169 Type 2 diabetes mellitus with other specified complication: Secondary | ICD-10-CM | POA: Diagnosis present

## 2019-11-18 DIAGNOSIS — F410 Panic disorder [episodic paroxysmal anxiety] without agoraphobia: Secondary | ICD-10-CM | POA: Diagnosis present

## 2019-11-18 DIAGNOSIS — Z841 Family history of disorders of kidney and ureter: Secondary | ICD-10-CM

## 2019-11-18 DIAGNOSIS — I509 Heart failure, unspecified: Secondary | ICD-10-CM | POA: Diagnosis not present

## 2019-11-18 DIAGNOSIS — R4182 Altered mental status, unspecified: Secondary | ICD-10-CM | POA: Diagnosis not present

## 2019-11-18 DIAGNOSIS — Z885 Allergy status to narcotic agent status: Secondary | ICD-10-CM

## 2019-11-18 DIAGNOSIS — T82898A Other specified complication of vascular prosthetic devices, implants and grafts, initial encounter: Secondary | ICD-10-CM

## 2019-11-18 LAB — COMPREHENSIVE METABOLIC PANEL
ALT: 14 U/L (ref 0–44)
AST: 17 U/L (ref 15–41)
Albumin: 2.3 g/dL — ABNORMAL LOW (ref 3.5–5.0)
Alkaline Phosphatase: 67 U/L (ref 38–126)
Anion gap: 12 (ref 5–15)
BUN: 45 mg/dL — ABNORMAL HIGH (ref 8–23)
CO2: 27 mmol/L (ref 22–32)
Calcium: 8.5 mg/dL — ABNORMAL LOW (ref 8.9–10.3)
Chloride: 93 mmol/L — ABNORMAL LOW (ref 98–111)
Creatinine, Ser: 6.29 mg/dL — ABNORMAL HIGH (ref 0.44–1.00)
GFR calc Af Amer: 8 mL/min — ABNORMAL LOW (ref >60)
GFR calc non Af Amer: 7 mL/min — ABNORMAL LOW (ref >60)
Glucose, Bld: 218 mg/dL — ABNORMAL HIGH (ref 70–99)
Potassium: 2 mmol/L — CL (ref 3.5–5.1)
Sodium: 132 mmol/L — ABNORMAL LOW (ref 135–145)
Total Bilirubin: 0.5 mg/dL (ref 0.3–1.2)
Total Protein: 6.1 g/dL — ABNORMAL LOW (ref 6.5–8.1)

## 2019-11-18 LAB — POCT I-STAT EG7
Acid-Base Excess: 5 mmol/L — ABNORMAL HIGH (ref 0.0–2.0)
Bicarbonate: 27.4 mmol/L (ref 20.0–28.0)
Calcium, Ion: 1.11 mmol/L — ABNORMAL LOW (ref 1.15–1.40)
HCT: 24 % — ABNORMAL LOW (ref 36.0–46.0)
Hemoglobin: 8.2 g/dL — ABNORMAL LOW (ref 12.0–15.0)
O2 Saturation: 99 %
Patient temperature: 99.4
Potassium: 2.4 mmol/L — CL (ref 3.5–5.1)
Sodium: 133 mmol/L — ABNORMAL LOW (ref 135–145)
TCO2: 28 mmol/L (ref 22–32)
pCO2, Ven: 32.6 mmHg — ABNORMAL LOW (ref 44.0–60.0)
pH, Ven: 7.535 — ABNORMAL HIGH (ref 7.250–7.430)
pO2, Ven: 122 mmHg — ABNORMAL HIGH (ref 32.0–45.0)

## 2019-11-18 LAB — DIFFERENTIAL
Abs Immature Granulocytes: 0.13 10*3/uL — ABNORMAL HIGH (ref 0.00–0.07)
Basophils Absolute: 0 10*3/uL (ref 0.0–0.1)
Basophils Relative: 0 %
Eosinophils Absolute: 0.1 10*3/uL (ref 0.0–0.5)
Eosinophils Relative: 0 %
Immature Granulocytes: 1 %
Lymphocytes Relative: 13 %
Lymphs Abs: 2 10*3/uL (ref 0.7–4.0)
Monocytes Absolute: 0.9 10*3/uL (ref 0.1–1.0)
Monocytes Relative: 6 %
Neutro Abs: 12.6 10*3/uL — ABNORMAL HIGH (ref 1.7–7.7)
Neutrophils Relative %: 80 %

## 2019-11-18 LAB — CBC
HCT: 24.7 % — ABNORMAL LOW (ref 36.0–46.0)
Hemoglobin: 8.4 g/dL — ABNORMAL LOW (ref 12.0–15.0)
MCH: 31.2 pg (ref 26.0–34.0)
MCHC: 34 g/dL (ref 30.0–36.0)
MCV: 91.8 fL (ref 80.0–100.0)
Platelets: 159 10*3/uL (ref 150–400)
RBC: 2.69 MIL/uL — ABNORMAL LOW (ref 3.87–5.11)
RDW: 13.9 % (ref 11.5–15.5)
WBC: 15.8 10*3/uL — ABNORMAL HIGH (ref 4.0–10.5)
nRBC: 0 % (ref 0.0–0.2)

## 2019-11-18 LAB — MAGNESIUM: Magnesium: 1.9 mg/dL (ref 1.7–2.4)

## 2019-11-18 LAB — LACTIC ACID, PLASMA: Lactic Acid, Venous: 0.7 mmol/L (ref 0.5–1.9)

## 2019-11-18 LAB — CBG MONITORING, ED: Glucose-Capillary: 159 mg/dL — ABNORMAL HIGH (ref 70–99)

## 2019-11-18 LAB — PROTIME-INR
INR: 1.2 (ref 0.8–1.2)
Prothrombin Time: 14.9 seconds (ref 11.4–15.2)

## 2019-11-18 LAB — AMMONIA: Ammonia: 22 umol/L (ref 9–35)

## 2019-11-18 LAB — APTT: aPTT: 47 seconds — ABNORMAL HIGH (ref 24–36)

## 2019-11-18 LAB — SARS CORONAVIRUS 2 BY RT PCR (HOSPITAL ORDER, PERFORMED IN ~~LOC~~ HOSPITAL LAB): SARS Coronavirus 2: NEGATIVE

## 2019-11-18 MED ORDER — POTASSIUM CHLORIDE 10 MEQ/100ML IV SOLN
10.0000 meq | INTRAVENOUS | Status: AC
  Administered 2019-11-18 (×4): 10 meq via INTRAVENOUS
  Filled 2019-11-18 (×4): qty 100

## 2019-11-18 MED ORDER — OXYCODONE HCL 5 MG PO TABS
5.0000 mg | ORAL_TABLET | ORAL | Status: DC | PRN
  Administered 2019-11-18 – 2019-11-24 (×3): 5 mg via ORAL
  Filled 2019-11-18 (×3): qty 1

## 2019-11-18 MED ORDER — POTASSIUM CHLORIDE CRYS ER 20 MEQ PO TBCR
40.0000 meq | EXTENDED_RELEASE_TABLET | Freq: Once | ORAL | Status: AC
  Administered 2019-11-18: 40 meq via ORAL
  Filled 2019-11-18: qty 2

## 2019-11-18 MED ORDER — LISINOPRIL 10 MG PO TABS
40.0000 mg | ORAL_TABLET | Freq: Once | ORAL | Status: AC
  Administered 2019-11-18: 40 mg via ORAL
  Filled 2019-11-18: qty 4

## 2019-11-18 MED ORDER — SODIUM CHLORIDE 0.9 % IV SOLN
INTRAVENOUS | Status: DC | PRN
  Administered 2019-11-18: 1000 mL via INTRAVENOUS

## 2019-11-18 NOTE — ED Notes (Addendum)
Please call Daughter Steve Rattler with any medical updates [phone]

## 2019-11-18 NOTE — ED Provider Notes (Signed)
[facility] EMERGENCY DEPARTMENT Provider Note   CSN: [id number] Arrival date & time: 11/18/19  1530     History Chief Complaint  Patient presents with  . Abdominal Pain  . Panic Attack    Patricia [name] is a 64 y.o. female.  Patient with history of CHF, ESRD on peritoneal dialysis, diabetes, chronic sigmoid diverticulitis, scheduled for robotic assisted sigmoidectomy and umbilical hernia repair on 11/22/2018 (Dr. Raul Del) --presents to the emergency department with behavior change, anxiety, confusion.  Daughter at bedside gives the majority of the history.  She states that on Thursday night (today is Saturday afternoon) patient had some difficulty with her peritoneal dialysis machine and did not sleep much.  She is complaining of abdominal pain and was not herself yesterday so they contacted their surgeon was seen.  They moved up her surgery for which the daughter is very grateful.  Current plan is for her to be on bowel rest over the weekend and have a dialysis catheter placed on Monday followed by transition to hemodialysis.  Last night after surgery appt,  the patient was confused, saying things that did not make any sense.  She was very weak and having difficulty walking.  Her coordination seem to be off as well.  Possible L-sided facial droop.  Patient has not had any fevers, URI symptoms, chest pain, shortness of breath.  She does continue to make some urine.  She has been on dialysis for about a year. Daughter was also concerned regarding lack of sleep and anxiety over upcoming procedures and wonders if that may be contributing to symptoms.         Past Medical History:  Diagnosis Date  . Allergy   . Anemia   . Anxiety   . Blood transfusion without reported diagnosis   . Cardiomyopathy ([place]) 11/2016   no ischemic eval due to CKD  . CHF (congestive heart failure) ([place])   . CKD (chronic kidney disease), stage IV ([place])   . Depression   . Diabetes mellitus without  complication ([place])    type 2  . Diverticulitis   . Dyspnea   . Hypertension   . Obesity     Patient Active Problem List   Diagnosis Date Noted  . Sigmoid diverticulitis 10/10/2019  . ESRD on peritoneal dialysis ([place]) 07/06/2019  . Periumbilical hernia 06/77/0340  . ESRD (end stage renal disease) ([place]) 01/20/2019  . Chronic combined systolic and diastolic CHF (congestive heart failure) ([place]) 12/06/2018  . Fluid overload 12/05/2018  . Type II diabetes mellitus with renal manifestations ([place]) 12/05/2018  . Hypoglycemia 12/05/2018  . Acute on chronic combined systolic and diastolic CHF (congestive heart failure) ([place]) 06/14/2018  . CKD (chronic kidney disease) stage 5, GFR less than 15 ml/min (HCC) 06/14/2018  . Anemia due to stage 5 chronic kidney disease, not on chronic dialysis ([place]) 06/14/2018  . Systolic CHF ([place]) 35/24/8185  . Diabetes mellitus type 2 with complications ([place]) 90/93/1121  . Transaminasemia 11/29/2016  . Cardiomyopathy ([place]) 11/12/2016  . Lipoma 10/21/2016  . CKD (chronic kidney disease) stage 4, GFR 15-29 ml/min (HCC) 08/10/2016  . Anxiety and depression 12/12/2015  . Diabetic nephropathy associated with type 2 diabetes mellitus ([place]) 11/15/2014  . Moderate nonproliferative diabetic retinopathy(362.05) 04/17/2014  . 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 Hypertension associated with diabetes ([place]) 03/28/2014  . Dental caries 03/28/2014  . Dyslipidemia 04/18/2013    Past Surgical History:  Procedure Laterality Date  . ABDOMINAL HYSTERECTOMY    . BREAST  SURGERY     reduction  . [place]  . LAPAROSCOPY N/A 06/16/2019   Procedure: primary closure of umbilical hernia;  Surgeon: Ralene Ok, MD;  Location: [facility];  Service: General;  Laterality: N/A;  . LIPOMA EXCISION     back  Dr. Excell Seltzer 03-22-18  . LIPOMA EXCISION N/A 03/22/2018   Procedure: EXCISION OF BACK LIPOMA;  Surgeon: Excell Seltzer, MD;  Location: [facility];  Service:  General;  Laterality: N/A;  . REDUCTION MAMMAPLASTY Bilateral      OB History    Gravida  2   Para  2   Term  2   Preterm      AB      Living  2     SAB      TAB      Ectopic      Multiple      Live Births              Family History  Problem Relation Age of Onset  . Diabetes Brother   . Stomach cancer Brother   . Kidney disease Brother   . Heart Problems Maternal Grandmother        pacemaker  . Heart disease Maternal Grandfather   . Rectal cancer Neg Hx   . Esophageal cancer Neg Hx   . Liver cancer Neg Hx   . Colon cancer Neg Hx     Social History   Tobacco Use  . Smoking status: Never Smoker  . Smokeless tobacco: Never Used  Substance Use Topics  . Alcohol use: No  . Drug use: No    Home Medications Prior to Admission medications   Medication Sig Start Date End Date Taking? Authorizing Provider  ACCU-CHEK FASTCLIX LANCETS MISC Uad tid 10/21/18   Ladell Pier, MD  acetaminophen (TYLENOL) 325 MG tablet Take 2 tablets (650 mg total) by mouth every 6 (six) hours as needed. Patient taking differently: Take 650 mg by mouth every 6 (six) hours as needed for headache (pain).  05/25/19   Varney Biles, MD  albuterol (PROVENTIL HFA;VENTOLIN HFA) 108 (90 Base) MCG/ACT inhaler Inhale 1-2 puffs into the lungs every 6 (six) hours as needed for wheezing or shortness of breath. 10/11/18   Marney Setting, NP  Amino Acids-Protein Hydrolys (FEEDING SUPPLEMENT, PRO-STAT SUGAR FREE 64,) LIQD Take 30 mLs by mouth 2 (two) times daily. 10/18/19   Swayze, Ava, DO  amoxicillin-clavulanate (AUGMENTIN) 875-125 MG tablet Take 1 tablet by mouth every 12 (twelve) hours. 10/18/19   Swayze, Ava, DO  aspirin EC 81 MG tablet Take 1 tablet (81 mg total) by mouth daily. 01/04/17   Maren Reamer, MD  atorvastatin (LIPITOR) 20 MG tablet Take 1/2 (one-half) tablet by mouth once daily Patient taking differently: Take 10 mg by mouth daily.  06/12/19   Ladell Pier, MD    Blood Glucose Monitoring Suppl (ACCU-CHEK GUIDE) w/Device KIT 1 each by Does not apply route 3 (three) times daily. Use tid as directed 10/21/18   Ladell Pier, MD  calcitRIOL (ROCALTROL) 0.25 MCG capsule Take 0.25 mcg by mouth daily. 09/12/19   [provider]  carvedilol (COREG) 25 MG tablet TAKE 1 TABLET BY MOUTH TWICE DAILY WITH A MEAL Patient taking differently: Take 25 mg by mouth 2 (two) times daily with a meal.  05/04/19   Croitoru, Mihai, MD  furosemide (LASIX) 80 MG tablet Take 1 tablet (80 mg total) by mouth daily. 10/18/19  Swayze, Ava, DO  gabapentin (NEURONTIN) 100 MG capsule TAKE 1 CAPSULE (100 MG TOTAL) BY MOUTH AT BEDTIME. 12/05/18   Ladell Pier, MD  gentamicin cream (GARAMYCIN) 0.1 % Apply 1 application topically See admin instructions. Apply topically to dialysis site after showering - every other day 06/10/19   [provider]  glucose blood (ACCU-CHEK GUIDE) test strip Use as instructed tid 10/21/18   Ladell Pier, MD  hydrALAZINE (APRESOLINE) 100 MG tablet Take 1 tablet (100 mg total) by mouth 2 (two) times daily. 06/20/19 10/10/19  [place] Donnamarie Poag, DO  insulin aspart (NOVOLOG) 100 UNIT/ML injection 3 units before meals. Patient not taking: Reported on 10/10/2019 04/06/17   Ladell Pier, MD  Insulin Glargine (LANTUS SOLOSTAR) 100 UNIT/ML Solostar Pen Inject 6 Units into the skin at bedtime. 10/18/19   Swayze, Ava, DO  Insulin Pen Needle (TRUEPLUS PEN NEEDLES) 32G X 4 MM MISC Use as directed to inject insulin. 03/25/18   Ladell Pier, MD  Insulin Pen Needle 31G X 5 MM MISC Use as directed 03/28/18   Ladell Pier, MD  isosorbide mononitrate (IMDUR) 30 MG 24 hr tablet Take 1/2 (one-half) tablet by mouth once daily Patient taking differently: Take 15 mg by mouth daily.  06/12/19   Croitoru, Mihai, MD  lisinopril (ZESTRIL) 40 MG tablet Take 40 mg by mouth daily. 09/19/19   [provider]  metroNIDAZOLE (FLAGYL) 500 MG tablet Take 1  tablet (500 mg total) by mouth every 8 (eight) hours. 10/18/19   Swayze, Ava, DO  oxyCODONE (OXY IR/ROXICODONE) 5 MG immediate release tablet Take 1 tablet (5 mg total) by mouth every 4 (four) hours as needed for moderate pain or breakthrough pain (Hold & Call MD if SBP<90, HR<65, RR<10, O2<90, or altered mental status.). 10/18/19   Swayze, Ava, DO  Polyethyl Glycol-Propyl Glycol (SYSTANE) 0.4-0.3 % SOLN Place 1 drop into both eyes 3 (three) times daily as needed (dry eyes).     [provider]  sevelamer (RENAGEL) 800 MG tablet Take 1,600-3,200 mg by mouth See admin instructions. Take 2-4 tablets (1600-3200 mg) by mouth up to three times daily with meals - 2-3 tablets with small meal, 4 tablets with large meal    [provider]  spironolactone (ALDACTONE) 25 MG tablet Take 25 mg by mouth daily. 09/19/19   [provider]    Allergies    Codone [hydrocodone], Norvasc [amlodipine besylate], Hydralazine, and Sulfa antibiotics  Review of Systems   Review of Systems  Constitutional: Positive for activity change. Negative for fever.  HENT: Negative for congestion, dental problem, rhinorrhea, sinus pressure and sore throat.   Eyes: Negative for photophobia, discharge, redness and visual disturbance.  Respiratory: Negative for cough and shortness of breath.   Cardiovascular: Negative for chest pain.  Gastrointestinal: Positive for abdominal pain (chronic). Negative for diarrhea, nausea and vomiting.  Genitourinary: Negative for flank pain.  Musculoskeletal: Negative for gait problem, myalgias, neck pain and neck stiffness.  Skin: Negative for rash.  Neurological: Positive for facial asymmetry. Negative for syncope, speech difficulty, weakness, light-headedness, numbness and headaches.  Psychiatric/Behavioral: Positive for confusion and sleep disturbance. The patient is nervous/anxious.     Physical Exam Updated Vital Signs BP (!) 183/90 (BP Location: Right Arm)   Pulse 87    Temp 99.4 F (37.4 C) (Oral)   Resp 20   Ht 5' 4"  (1.626 m)   Wt 68.9 kg   SpO2 99%   BMI 26.06 kg/m  Physical Exam Vitals and nursing note reviewed.  Constitutional:      Appearance: She is well-developed.  HENT:     Head: Normocephalic and atraumatic.     Right Ear: Tympanic membrane, ear canal and external ear normal.     Left Ear: Tympanic membrane, ear canal and external ear normal.     Nose: Nose normal.     Mouth/Throat:     Pharynx: Uvula midline.  Eyes:     General: Lids are normal.        Right eye: No discharge.        Left eye: No discharge.     Extraocular Movements:     Right eye: No nystagmus.     Left eye: No nystagmus.     Conjunctiva/sclera: Conjunctivae normal.     Pupils: Pupils are equal, round, and reactive to light.  Cardiovascular:     Rate and Rhythm: Normal rate and regular rhythm.     Heart sounds: Normal heart sounds.  Pulmonary:     Effort: Pulmonary effort is normal.     Breath sounds: Normal breath sounds.  Abdominal:     Palpations: Abdomen is soft.     Tenderness: There is abdominal tenderness (mild, chronic LLQ) in the left lower quadrant.     Hernia: A hernia is present. Hernia is present in the umbilical area (soft, non-tender).     Comments: PD cath in place RLQ  Musculoskeletal:     Cervical back: Normal range of motion and neck supple. No tenderness or bony tenderness.     Right lower leg: No edema.     Left lower leg: No edema.  Skin:    General: Skin is warm and dry.  Neurological:     Mental Status: She is alert and oriented to person, place, and time.     GCS: GCS eye subscore is 4. GCS verbal subscore is 5. GCS motor subscore is 6.     Cranial Nerves: No cranial nerve deficit.     Sensory: No sensory deficit.     Deep Tendon Reflexes: Reflexes are normal and symmetric.     ED Results / Procedures / Treatments   Labs (all labs ordered are listed, but only abnormal results are displayed) Labs Reviewed  APTT -  Abnormal; Notable for the following components:      Result Value   aPTT 47 (*)    All other components within normal limits  CBC - Abnormal; Notable for the following components:   WBC 15.8 (*)    RBC 2.69 (*)    Hemoglobin 8.4 (*)    HCT 24.7 (*)    All other components within normal limits  DIFFERENTIAL - Abnormal; Notable for the following components:   Neutro Abs 12.6 (*)    Abs Immature Granulocytes 0.13 (*)    All other components within normal limits  COMPREHENSIVE METABOLIC PANEL - Abnormal; Notable for the following components:   Sodium 132 (*)    Potassium 2.0 (*)    Chloride 93 (*)    Glucose, Bld 218 (*)    BUN 45 (*)    Creatinine, Ser 6.29 (*)    Calcium 8.5 (*)    Total Protein 6.1 (*)    Albumin 2.3 (*)    GFR calc non Af Amer 7 (*)    GFR calc Af Amer 8 (*)    All other components within normal limits  POCT I-STAT EG7 - Abnormal; Notable for the following components:  pH, Ven 7.535 (*)    pCO2, Ven 32.6 (*)    pO2, Ven 122.0 (*)    Acid-Base Excess 5.0 (*)    Sodium 133 (*)    Potassium 2.4 (*)    Calcium, Ion 1.11 (*)    HCT 24.0 (*)    Hemoglobin 8.2 (*)    All other components within normal limits  SARS CORONAVIRUS 2 (TAT 6-24 HRS)  CULTURE, BLOOD (ROUTINE X 2)  CULTURE, BLOOD (ROUTINE X 2)  SARS CORONAVIRUS 2 BY RT PCR (HOSPITAL ORDER, PERFORMED IN [facility])  PROTIME-INR  AMMONIA  MAGNESIUM  LACTIC ACID, PLASMA  URINALYSIS, ROUTINE W REFLEX MICROSCOPIC  I-STAT VENOUS BLOOD GAS, ED    EKG EKG Interpretation  Date/Time:  Saturday November 18 2019 17:22:10 EST Ventricular Rate:  86 PR Interval:    QRS Duration: 93 QT Interval:  483 QTC Calculation: 578 R Axis:   39 Text Interpretation: Sinus rhythm Consider left ventricular hypertrophy Prolonged QT interval Baseline wander in lead(s) V2 since last tracing no significant change Confirmed by Malvin Johns [phone]) on 11/18/2019 5:28:17 PM   Radiology DG Chest 2  View  Result Date: 11/18/2019 CLINICAL DATA:  Abdominal pain, weakness EXAM: CHEST - 2 VIEW COMPARISON:  02/09/2019 FINDINGS: Heart and mediastinal contours are within normal limits. No focal opacities or effusions. No acute bony abnormality. IMPRESSION: No active cardiopulmonary disease. Electronically Signed   By: Rolm Baptise M.D.   On: 11/18/2019 17:14   CT Head Wo Contrast  Result Date: 11/18/2019 CLINICAL DATA:  Encephalopathy.  Altered mental status. EXAM: CT HEAD WITHOUT CONTRAST TECHNIQUE: Contiguous axial images were obtained from the base of the skull through the vertex without intravenous contrast. COMPARISON:  None. FINDINGS: Brain: No acute intracranial abnormality. Specifically, no hemorrhage, hydrocephalus, mass lesion, acute infarction, or significant intracranial injury. Vascular: No hyperdense vessel or unexpected calcification. Skull: No acute calvarial abnormality. Sinuses/Orbits: Visualized paranasal sinuses and mastoids clear. Orbital soft tissues unremarkable. Other: None IMPRESSION: No acute intracranial abnormality. Electronically Signed   By: Rolm Baptise M.D.   On: 11/18/2019 17:22    Procedures Procedures (including critical care time)  Medications Ordered in ED Medications  0.9 %  sodium chloride infusion (1,000 mLs Intravenous New Bag/Given 11/18/19 1850)  oxyCODONE (Oxy IR/ROXICODONE) immediate release tablet 5 mg (5 mg Oral Given 11/18/19 2011)  lisinopril (ZESTRIL) tablet 40 mg (40 mg Oral Given 11/18/19 1825)  potassium chloride SA (KLOR-CON) CR tablet 40 mEq (40 mEq Oral Given 11/18/19 1851)  potassium chloride 10 mEq in 100 mL IVPB ( Intravenous Stopped 11/18/19 2157)    ED Course  I have reviewed the triage vital signs and the nursing notes.  Pertinent labs & imaging results that were available during my care of the patient were reviewed by me and considered in my medical decision making (see chart for details).  Patient seen and examined. Work-up initiated. CC  weakness, aloc/confusion, some questionable stroke symptoms. Will need evaluated for metabolic cause given ESRD status as well.   Vital signs reviewed and are as follows: BP (!) 183/90 (BP Location: Right Arm)   Pulse 87   Temp 99.4 F (37.4 C) (Oral)   Resp 20   Ht 5' 4"  (1.626 m)   Wt 68.9 kg   SpO2 99%   BMI 26.06 kg/m   Lab work-up demonstrates potassium of 2.0.  Repletion ordered. Given weakness, change in mentation -- feel patient would benefit from admission.  Patient discussed with and seen  by Dr. Tamera Punt.  Magnesium checked and is normal.  I discussed the case with the patient and her daughter at bedside.  Feel that she would be best served from admission at [facility] given the fact that she is following up with her nephrologist in 2 days regarding transition to hemodialysis.  I discussed case with Dr. Roel Cluck.  She requested additional labs including VBG, lactic acid.  Requested that I discussed the case with nephrology.  I spoke with Dr. Carolin Sicks.  He states that hypokalemia is not uncommon in PD patients.  He states that he would give the patient IV potassium and then transition to oral potassium supplementation.  He states that if admitted and if needed, he will be happy to consult on patient tomorrow in hospital.  Reviewed remainder of lab work-up.  Patient awaiting bed assignment.  CRITICAL CARE Performed by: Carlisle Cater PA-C Total critical care time: 45 minutes Critical care time was exclusive of separately billable procedures and treating other patients. Critical care was necessary to treat or prevent imminent or life-threatening deterioration. Critical care was time spent personally by me on the following activities: development of treatment plan with patient and/or surrogate as well as nursing, discussions with consultants, evaluation of patient's response to treatment, examination of patient, obtaining history from patient or surrogate, ordering and performing treatments  and interventions, ordering and review of laboratory studies, ordering and review of radiographic studies, pulse oximetry and re-evaluation of patient's condition.     MDM Rules/Calculators/A&P                      Admit.    Final Clinical Impression(s) / ED Diagnoses Final diagnoses:  Hypokalemia  Generalized weakness  ESRD (end stage renal disease) [facility])    Rx / DC Orders ED Discharge Orders    None       Carlisle Cater, PA-C 11/18/19 2204    Malvin Johns, MD 11/18/19 2225

## 2019-11-18 NOTE — ED Triage Notes (Signed)
Pt arrives to ED with daughter who reports pt has not had any sleep in the past 2 days. Patient is not very active in triage. Daughter reports that she is concerned because patient has not had her peritoneal dialysis r/t docotrs orders for belly rest for upcoming surgery to address her diverticulitis and hernia. Daughter states that patient is having anxiety over upcoming surgeries and changes.

## 2019-11-18 NOTE — ED Notes (Signed)
Patient transported to CT 

## 2019-11-18 NOTE — ED Notes (Signed)
PT states unable to urinate at this time.

## 2019-11-18 NOTE — ED Notes (Signed)
[facility] notified (Jaime) - patient ready for transport 

## 2019-11-19 DIAGNOSIS — F332 Major depressive disorder, recurrent severe without psychotic features: Secondary | ICD-10-CM

## 2019-11-19 DIAGNOSIS — E113319 Type 2 diabetes mellitus with moderate nonproliferative diabetic retinopathy with macular edema, unspecified eye: Secondary | ICD-10-CM | POA: Diagnosis present

## 2019-11-19 DIAGNOSIS — F419 Anxiety disorder, unspecified: Secondary | ICD-10-CM | POA: Diagnosis not present

## 2019-11-19 DIAGNOSIS — I2510 Atherosclerotic heart disease of native coronary artery without angina pectoris: Secondary | ICD-10-CM | POA: Diagnosis present

## 2019-11-19 DIAGNOSIS — K429 Umbilical hernia without obstruction or gangrene: Secondary | ICD-10-CM | POA: Diagnosis present

## 2019-11-19 DIAGNOSIS — D631 Anemia in chronic kidney disease: Secondary | ICD-10-CM | POA: Diagnosis present

## 2019-11-19 DIAGNOSIS — E1122 Type 2 diabetes mellitus with diabetic chronic kidney disease: Secondary | ICD-10-CM | POA: Diagnosis present

## 2019-11-19 DIAGNOSIS — Z9049 Acquired absence of other specified parts of digestive tract: Secondary | ICD-10-CM | POA: Diagnosis not present

## 2019-11-19 DIAGNOSIS — E1140 Type 2 diabetes mellitus with diabetic neuropathy, unspecified: Secondary | ICD-10-CM | POA: Diagnosis present

## 2019-11-19 DIAGNOSIS — R531 Weakness: Secondary | ICD-10-CM

## 2019-11-19 DIAGNOSIS — J302 Other seasonal allergic rhinitis: Secondary | ICD-10-CM | POA: Diagnosis present

## 2019-11-19 DIAGNOSIS — F410 Panic disorder [episodic paroxysmal anxiety] without agoraphobia: Secondary | ICD-10-CM | POA: Diagnosis present

## 2019-11-19 DIAGNOSIS — I5042 Chronic combined systolic (congestive) and diastolic (congestive) heart failure: Secondary | ICD-10-CM | POA: Diagnosis present

## 2019-11-19 DIAGNOSIS — E785 Hyperlipidemia, unspecified: Secondary | ICD-10-CM | POA: Diagnosis present

## 2019-11-19 DIAGNOSIS — N2581 Secondary hyperparathyroidism of renal origin: Secondary | ICD-10-CM | POA: Diagnosis present

## 2019-11-19 DIAGNOSIS — I428 Other cardiomyopathies: Secondary | ICD-10-CM | POA: Diagnosis present

## 2019-11-19 DIAGNOSIS — G9341 Metabolic encephalopathy: Secondary | ICD-10-CM | POA: Diagnosis present

## 2019-11-19 DIAGNOSIS — K5732 Diverticulitis of large intestine without perforation or abscess without bleeding: Secondary | ICD-10-CM | POA: Diagnosis present

## 2019-11-19 DIAGNOSIS — E876 Hypokalemia: Secondary | ICD-10-CM | POA: Diagnosis present

## 2019-11-19 DIAGNOSIS — Z992 Dependence on renal dialysis: Secondary | ICD-10-CM | POA: Diagnosis not present

## 2019-11-19 DIAGNOSIS — E1169 Type 2 diabetes mellitus with other specified complication: Secondary | ICD-10-CM | POA: Diagnosis present

## 2019-11-19 DIAGNOSIS — I152 Hypertension secondary to endocrine disorders: Secondary | ICD-10-CM | POA: Diagnosis present

## 2019-11-19 DIAGNOSIS — N185 Chronic kidney disease, stage 5: Secondary | ICD-10-CM | POA: Diagnosis not present

## 2019-11-19 DIAGNOSIS — I132 Hypertensive heart and chronic kidney disease with heart failure and with stage 5 chronic kidney disease, or end stage renal disease: Secondary | ICD-10-CM | POA: Diagnosis present

## 2019-11-19 DIAGNOSIS — N186 End stage renal disease: Secondary | ICD-10-CM | POA: Diagnosis present

## 2019-11-19 DIAGNOSIS — Z20822 Contact with and (suspected) exposure to covid-19: Secondary | ICD-10-CM | POA: Diagnosis present

## 2019-11-19 LAB — URINALYSIS, ROUTINE W REFLEX MICROSCOPIC
Bilirubin Urine: NEGATIVE
Glucose, UA: >=500 mg/dL — AB
Hgb urine dipstick: NEGATIVE
Ketones, ur: NEGATIVE mg/dL
Leukocytes,Ua: NEGATIVE
Nitrite: NEGATIVE
Protein, ur: >300 mg/dL — AB
Specific Gravity, Urine: 1.015 (ref 1.005–1.030)
pH: 6.5 (ref 5.0–8.0)

## 2019-11-19 LAB — SARS CORONAVIRUS 2 (TAT 6-24 HRS): SARS Coronavirus 2: NEGATIVE

## 2019-11-19 LAB — BASIC METABOLIC PANEL
Anion gap: 13 (ref 5–15)
BUN: 42 mg/dL — ABNORMAL HIGH (ref 8–23)
CO2: 23 mmol/L (ref 22–32)
Calcium: 8.4 mg/dL — ABNORMAL LOW (ref 8.9–10.3)
Chloride: 99 mmol/L (ref 98–111)
Creatinine, Ser: 6.34 mg/dL — ABNORMAL HIGH (ref 0.44–1.00)
GFR calc Af Amer: 8 mL/min — ABNORMAL LOW (ref >60)
GFR calc non Af Amer: 7 mL/min — ABNORMAL LOW (ref >60)
Glucose, Bld: 151 mg/dL — ABNORMAL HIGH (ref 70–99)
Potassium: 2.6 mmol/L — CL (ref 3.5–5.1)
Sodium: 135 mmol/L (ref 135–145)

## 2019-11-19 LAB — URINALYSIS, MICROSCOPIC (REFLEX)

## 2019-11-19 LAB — POTASSIUM: Potassium: 2.9 mmol/L — ABNORMAL LOW (ref 3.5–5.1)

## 2019-11-19 LAB — GLUCOSE, CAPILLARY: Glucose-Capillary: 157 mg/dL — ABNORMAL HIGH (ref 70–99)

## 2019-11-19 MED ORDER — FUROSEMIDE 80 MG PO TABS
80.0000 mg | ORAL_TABLET | Freq: Every day | ORAL | Status: DC
  Administered 2019-11-19 – 2019-11-21 (×3): 80 mg via ORAL
  Filled 2019-11-19 (×3): qty 1

## 2019-11-19 MED ORDER — SODIUM CHLORIDE 0.9% FLUSH
3.0000 mL | Freq: Two times a day (BID) | INTRAVENOUS | Status: DC
  Administered 2019-11-19 – 2019-11-25 (×4): 3 mL via INTRAVENOUS

## 2019-11-19 MED ORDER — SODIUM CHLORIDE 0.9 % IV SOLN: 250.0000 mL | INTRAVENOUS | Status: DC | PRN

## 2019-11-19 MED ORDER — AMOXICILLIN-POT CLAVULANATE 875-125 MG PO TABS: 1.0000 | ORAL_TABLET | Freq: Two times a day (BID) | ORAL | Status: DC

## 2019-11-19 MED ORDER — PRO-STAT SUGAR FREE PO LIQD
30.0000 mL | Freq: Two times a day (BID) | ORAL | Status: DC
  Administered 2019-11-19 – 2019-11-25 (×5): 30 mL via ORAL
  Filled 2019-11-19 (×8): qty 30

## 2019-11-19 MED ORDER — ASPIRIN EC 81 MG PO TBEC
81.0000 mg | DELAYED_RELEASE_TABLET | Freq: Every day | ORAL | Status: DC
  Administered 2019-11-19 – 2019-11-25 (×6): 81 mg via ORAL
  Filled 2019-11-19 (×6): qty 1

## 2019-11-19 MED ORDER — CALCITRIOL 0.25 MCG PO CAPS
0.2500 ug | ORAL_CAPSULE | Freq: Every day | ORAL | Status: DC
  Administered 2019-11-19 – 2019-11-25 (×5): 0.25 ug via ORAL
  Filled 2019-11-19 (×6): qty 1

## 2019-11-19 MED ORDER — POTASSIUM CHLORIDE CRYS ER 20 MEQ PO TBCR
40.0000 meq | EXTENDED_RELEASE_TABLET | Freq: Two times a day (BID) | ORAL | Status: AC
  Administered 2019-11-19 (×2): 40 meq via ORAL
  Filled 2019-11-19 (×2): qty 2

## 2019-11-19 MED ORDER — SEVELAMER CARBONATE 800 MG PO TABS
800.0000 mg | ORAL_TABLET | Freq: Three times a day (TID) | ORAL | Status: DC
  Administered 2019-11-19 – 2019-11-25 (×11): 800 mg via ORAL
  Filled 2019-11-19 (×12): qty 1

## 2019-11-19 MED ORDER — SPIRONOLACTONE 25 MG PO TABS
25.0000 mg | ORAL_TABLET | Freq: Every day | ORAL | Status: DC
  Administered 2019-11-19 – 2019-11-25 (×6): 25 mg via ORAL
  Filled 2019-11-19 (×6): qty 1

## 2019-11-19 MED ORDER — METRONIDAZOLE 500 MG PO TABS: 500.0000 mg | ORAL_TABLET | Freq: Two times a day (BID) | ORAL | Status: DC

## 2019-11-19 MED ORDER — ATORVASTATIN CALCIUM 10 MG PO TABS
10.0000 mg | ORAL_TABLET | Freq: Every day | ORAL | Status: DC
  Administered 2019-11-19 – 2019-11-25 (×7): 10 mg via ORAL
  Filled 2019-11-19 (×7): qty 1

## 2019-11-19 MED ORDER — ISOSORBIDE MONONITRATE ER 30 MG PO TB24
15.0000 mg | ORAL_TABLET | Freq: Every day | ORAL | Status: DC
  Administered 2019-11-19 – 2019-11-25 (×6): 15 mg via ORAL
  Filled 2019-11-19 (×6): qty 1

## 2019-11-19 MED ORDER — SODIUM CHLORIDE 0.9% FLUSH
3.0000 mL | Freq: Two times a day (BID) | INTRAVENOUS | Status: DC
  Administered 2019-11-19 – 2019-11-25 (×9): 3 mL via INTRAVENOUS

## 2019-11-19 MED ORDER — ACETAMINOPHEN 325 MG PO TABS
650.0000 mg | ORAL_TABLET | Freq: Four times a day (QID) | ORAL | Status: DC | PRN
  Administered 2019-11-23 – 2019-11-25 (×4): 650 mg via ORAL
  Filled 2019-11-19 (×4): qty 2

## 2019-11-19 MED ORDER — POTASSIUM CHLORIDE CRYS ER 20 MEQ PO TBCR
40.0000 meq | EXTENDED_RELEASE_TABLET | Freq: Once | ORAL | Status: AC
  Administered 2019-11-19: 40 meq via ORAL
  Filled 2019-11-19: qty 2

## 2019-11-19 MED ORDER — LISINOPRIL 40 MG PO TABS
40.0000 mg | ORAL_TABLET | Freq: Every day | ORAL | Status: DC
  Administered 2019-11-19 – 2019-11-25 (×6): 40 mg via ORAL
  Filled 2019-11-19 (×6): qty 1

## 2019-11-19 MED ORDER — CHLORHEXIDINE GLUCONATE CLOTH 2 % EX PADS
6.0000 | MEDICATED_PAD | Freq: Every day | CUTANEOUS | Status: DC
  Administered 2019-11-20 – 2019-11-22 (×3): 6 via TOPICAL

## 2019-11-19 MED ORDER — ALBUTEROL SULFATE (2.5 MG/3ML) 0.083% IN NEBU: 2.5000 mg | INHALATION_SOLUTION | Freq: Four times a day (QID) | RESPIRATORY_TRACT | Status: DC | PRN

## 2019-11-19 MED ORDER — HYDRALAZINE HCL 20 MG/ML IJ SOLN
10.0000 mg | INTRAMUSCULAR | Status: DC | PRN
  Administered 2019-11-19 – 2019-11-21 (×2): 10 mg via INTRAVENOUS
  Filled 2019-11-19 (×2): qty 1

## 2019-11-19 MED ORDER — HYDRALAZINE HCL 100 MG PO TABS: 100.0000 mg | ORAL_TABLET | Freq: Two times a day (BID) | ORAL | Status: DC

## 2019-11-19 MED ORDER — CARVEDILOL 25 MG PO TABS
25.0000 mg | ORAL_TABLET | Freq: Two times a day (BID) | ORAL | Status: DC
  Administered 2019-11-19 – 2019-11-25 (×9): 25 mg via ORAL
  Filled 2019-11-19 (×9): qty 1

## 2019-11-19 MED ORDER — GABAPENTIN 100 MG PO CAPS
100.0000 mg | ORAL_CAPSULE | Freq: Every day | ORAL | Status: DC
  Administered 2019-11-19 – 2019-11-24 (×5): 100 mg via ORAL
  Filled 2019-11-19 (×6): qty 1

## 2019-11-19 MED ORDER — HEPARIN SODIUM (PORCINE) 5000 UNIT/ML IJ SOLN
5000.0000 [IU] | Freq: Three times a day (TID) | INTRAMUSCULAR | Status: DC
  Administered 2019-11-19 – 2019-11-25 (×17): 5000 [IU] via SUBCUTANEOUS
  Filled 2019-11-19 (×16): qty 1

## 2019-11-19 MED ORDER — LABETALOL HCL 5 MG/ML IV SOLN
10.0000 mg | INTRAVENOUS | Status: DC | PRN
  Administered 2019-11-21: 10 mg via INTRAVENOUS
  Filled 2019-11-19: qty 4

## 2019-11-19 MED ORDER — HYDRALAZINE HCL 50 MG PO TABS
100.0000 mg | ORAL_TABLET | Freq: Two times a day (BID) | ORAL | Status: DC
  Administered 2019-11-19 (×2): 100 mg via ORAL
  Filled 2019-11-19 (×3): qty 2

## 2019-11-19 MED ORDER — MAGNESIUM SULFATE 2 GM/50ML IV SOLN
2.0000 g | Freq: Once | INTRAVENOUS | Status: AC
  Administered 2019-11-19: 2 g via INTRAVENOUS
  Filled 2019-11-19: qty 50

## 2019-11-19 MED ORDER — SODIUM CHLORIDE 0.9% FLUSH: 3.0000 mL | INTRAVENOUS | Status: DC | PRN

## 2019-11-19 MED ORDER — ONDANSETRON HCL 4 MG/2ML IJ SOLN: 4.0000 mg | Freq: Four times a day (QID) | INTRAMUSCULAR | Status: DC | PRN

## 2019-11-19 MED ORDER — ONDANSETRON HCL 4 MG PO TABS: 4.0000 mg | ORAL_TABLET | Freq: Four times a day (QID) | ORAL | Status: DC | PRN

## 2019-11-19 NOTE — Progress Notes (Signed)
New Admission Note:  Arrival Method: Pt arrived via [facility] at this time. Mental Orientation: Pt is alert and confused x 4 Telemetry: Box 4, CCMD notified Assessment: Completed Skin: Complete, refer to flowsheets IV: Right forearm S.L. Pain: Denies Tubes: PD catheter RLQ Safety Measures: Safety Fall Prevention Plan was given, discussed and signed. Admission: Completed [facility] Orientation: Patient has been orientated to the room, unit and the staff. Family: None  Orders have been reviewed and implemented. Will continue to monitor the patient. Call light has been placed within reach and bed alarm has been activated. Mats have also been placed down on the ground next to the bed.   Perry Mount, RN  Phone Number: [phone]

## 2019-11-19 NOTE — Consult Note (Addendum)
[facility] Renal Consultation Note    Indication for Consultation:  Management of ESRD/hemodialysis; anemia, hypertension/volume and secondary hyperparathyroidism  ZOX:WRUEAVW, Dalbert Batman, MD  HPI: Patricia [name] is a 64 y.o. female. ESRD on PD. Past medical history significant for DM, HTN, combined systolic and diastolic CHF, hx incarcerated umbilical hernia s/p repair 009/2020, and recent admission from 10/10/19 to 10/18/19 for diverticulitis still on Augmentin and Flagyl.   Patient seen and examined at bedside.  Presented to the hospital last night due to altered mental status. Mumbled in response to my questions at first then stopped responding all together and just stared at the wall. History obtained from chart review and from daughters over the phone due to patient AMS.  Report change in behavior Friday night (2/5), which worsened throughout the day yesterday, including confusion, anxiousness, and anxiety.  She has had terrible pain with dialysis, which has worsened over the last couple weeks.  Thursday night (2/4) she got stuck on the 4th (out of 5) drain and the PD machine alarmed 14 times.  She was seen in Dr. London Pepper office on 2/5 and he decided to move up her lap sigmoid colectomy and umbilical hernia repair with mesh to 11/23/19 due to her worsening symptoms.  These symptoms include LLQ pain worsened during PD, n/v/d, anorexia and weakness.  Plan was to transition to hemodialysis this week in preparation of surgery, and to have [place] placed as OP tomorrow.  Pertinent findings on admission: AMS, K 2.0, WBC 15.8, SCr 6.34, BUN 42, negative COVID, CT head w/no acute findings, CXR with no evidence cardiopulmonary disease.  Patient has been admitted for further evaluation and management.  Called and updated patient daughter's of plan.   Past Medical History:  Diagnosis Date  . Allergy   . Anemia   . Anxiety   . Blood transfusion without reported diagnosis   . Cardiomyopathy  ([place]) 11/2016   no ischemic eval due to CKD  . CHF (congestive heart failure) ([place])   . CKD (chronic kidney disease), stage IV ([place])   . Depression   . Diabetes mellitus without complication ([place])    type 2  . Diverticulitis   . Dyspnea   . Hypertension   . Obesity    Past Surgical History:  Procedure Laterality Date  . ABDOMINAL HYSTERECTOMY    . BREAST SURGERY     reduction  . [place]  . LAPAROSCOPY N/A 06/16/2019   Procedure: primary closure of umbilical hernia;  Surgeon: Ralene Ok, MD;  Location: [facility];  Service: General;  Laterality: N/A;  . LIPOMA EXCISION     back  Dr. Excell Seltzer 03-22-18  . LIPOMA EXCISION N/A 03/22/2018   Procedure: EXCISION OF BACK LIPOMA;  Surgeon: Excell Seltzer, MD;  Location: [facility];  Service: General;  Laterality: N/A;  . REDUCTION MAMMAPLASTY Bilateral    Family History  Problem Relation Age of Onset  . Diabetes Brother   . Stomach cancer Brother   . Kidney disease Brother   . Heart Problems Maternal Grandmother        pacemaker  . Heart disease Maternal Grandfather   . Rectal cancer Neg Hx   . Esophageal cancer Neg Hx   . Liver cancer Neg Hx   . Colon cancer Neg Hx    Social History:  reports that she has never smoked. She has never used smokeless tobacco. She reports that she does not drink alcohol or use drugs. Allergies  Allergen Reactions  .  Codone [Hydrocodone] Itching  . Norvasc [Amlodipine Besylate] Swelling    Lower extremity  . Hydralazine Swelling and Other (See Comments)    Leg swelling. Pt takes this as o/p  . Sulfa Antibiotics Itching and Rash   Prior to Admission medications   Medication Sig Start Date End Date Taking? Authorizing Provider  ACCU-CHEK FASTCLIX LANCETS MISC Uad tid Patient taking differently: 1 each by Other route 3 (three) times daily.  10/21/18  Yes Ladell Pier, MD  acetaminophen (TYLENOL) 325 MG tablet Take 2 tablets (650 mg total) by mouth every 6 (six) hours as  needed. Patient taking differently: Take 650 mg by mouth every 6 (six) hours as needed for headache (pain).  05/25/19  Yes Varney Biles, MD  Amino Acids-Protein Hydrolys (FEEDING SUPPLEMENT, PRO-STAT SUGAR FREE 64,) LIQD Take 30 mLs by mouth 2 (two) times daily. 10/18/19  Yes Swayze, Ava, DO  aspirin EC 81 MG tablet Take 1 tablet (81 mg total) by mouth daily. 01/04/17  Yes Langeland, Dawn T, MD  atorvastatin (LIPITOR) 20 MG tablet Take 1/2 (one-half) tablet by mouth once daily Patient taking differently: Take 10 mg by mouth daily.  06/12/19  Yes Ladell Pier, MD  Blood Glucose Monitoring Suppl (ACCU-CHEK GUIDE) w/Device KIT 1 each by Does not apply route 3 (three) times daily. Use tid as directed 10/21/18  Yes Ladell Pier, MD  calcitRIOL (ROCALTROL) 0.25 MCG capsule Take 0.25 mcg by mouth daily. 09/12/19  Yes [provider]  carvedilol (COREG) 25 MG tablet TAKE 1 TABLET BY MOUTH TWICE DAILY WITH A MEAL Patient taking differently: Take 25 mg by mouth 2 (two) times daily with a meal.  05/04/19  Yes Croitoru, Mihai, MD  escitalopram (LEXAPRO) 20 MG tablet Take 20 mg by mouth daily. 11/13/19  Yes [provider]  furosemide (LASIX) 80 MG tablet Take 1 tablet (80 mg total) by mouth daily. 10/18/19  Yes Swayze, Ava, DO  gabapentin (NEURONTIN) 100 MG capsule TAKE 1 CAPSULE (100 MG TOTAL) BY MOUTH AT BEDTIME. 12/05/18  Yes Ladell Pier, MD  gentamicin cream (GARAMYCIN) 0.1 % Apply 1 application topically See admin instructions. Apply topically to dialysis site after showering - every other day 06/10/19  Yes [provider]  glucose blood (ACCU-CHEK GUIDE) test strip Use as instructed tid Patient taking differently: 1 each by Other route 3 (three) times daily.  10/21/18  Yes Ladell Pier, MD  hydrALAZINE (APRESOLINE) 100 MG tablet Take 1 tablet (100 mg total) by mouth 2 (two) times daily. 06/20/19 11/19/19 Yes [place] Eric J, DO  Insulin Glargine (LANTUS SOLOSTAR) 100  UNIT/ML Solostar Pen Inject 6 Units into the skin at bedtime. 10/18/19  Yes Swayze, Ava, DO  Insulin Pen Needle (TRUEPLUS PEN NEEDLES) 32G X 4 MM MISC Use as directed to inject insulin. Patient taking differently: 1 each by Other route daily.  03/25/18  Yes Ladell Pier, MD  Insulin Pen Needle 31G X 5 MM MISC Use as directed Patient taking differently: 1 each by Other route daily.  03/28/18  Yes Ladell Pier, MD  isosorbide mononitrate (IMDUR) 30 MG 24 hr tablet Take 1/2 (one-half) tablet by mouth once daily Patient taking differently: Take 15 mg by mouth daily.  06/12/19  Yes Croitoru, Mihai, MD  lisinopril (ZESTRIL) 40 MG tablet Take 40 mg by mouth daily. 09/19/19  Yes [provider]  metroNIDAZOLE (FLAGYL) 500 MG tablet Take 1 tablet (500 mg total) by mouth every 8 (eight) hours. Patient  taking differently: Take 500 mg by mouth 3 (three) times daily. Filled 11/02/19 for quantity of #42. 10/18/19  Yes Swayze, Ava, DO  oxyCODONE (OXY IR/ROXICODONE) 5 MG immediate release tablet Take 1 tablet (5 mg total) by mouth every 4 (four) hours as needed for moderate pain or breakthrough pain (Hold & Call MD if SBP<90, HR<65, RR<10, O2<90, or altered mental status.). 10/18/19  Yes Swayze, Ava, DO  promethazine (PHENERGAN) 25 MG tablet Take 25 mg by mouth daily as needed for nausea.  11/17/19  Yes [provider]  sevelamer (RENAGEL) 800 MG tablet Take 1,600-3,200 mg by mouth See admin instructions. Take 2-4 tablets (1600-3200 mg) by mouth up to three times daily with meals - 2-3 tablets with small meal, 4 tablets with large meal   Yes [provider]  spironolactone (ALDACTONE) 25 MG tablet Take 25 mg by mouth daily. 09/19/19  Yes [provider]  albuterol (PROVENTIL HFA;VENTOLIN HFA) 108 (90 Base) MCG/ACT inhaler Inhale 1-2 puffs into the lungs every 6 (six) hours as needed for wheezing or shortness of breath. Patient not taking: Reported on 11/19/2019 10/11/18   Marney Setting, NP  amoxicillin-clavulanate (AUGMENTIN) 875-125 MG tablet Take 1 tablet by mouth every 12 (twelve) hours. Patient not taking: Reported on 11/19/2019 10/18/19   Swayze, Ava, DO  ciprofloxacin (CIPRO) 500 MG tablet Take 500 mg by mouth 2 (two) times daily. Prescribed 11/02/19 for quantity #28. 11/02/19   [provider]  insulin aspart (NOVOLOG) 100 UNIT/ML injection 3 units before meals. Patient not taking: Reported on 11/19/2019 04/06/17   Ladell Pier, MD  Polyethyl Glycol-Propyl Glycol (SYSTANE) 0.4-0.3 % SOLN Place 1 drop into both eyes 3 (three) times daily as needed (dry eyes).     [provider]   Current Facility-Administered Medications  Medication Dose Route Frequency Provider Last Rate Last Admin  . 0.9 %  sodium chloride infusion   Intravenous PRN Malvin Johns, MD   Stopped at 11/18/19 2357  . 0.9 %  sodium chloride infusion  250 mL Intravenous PRN Cristescu, Linard Millers, MD      . Derrill Memo ON 11/20/2019] acetaminophen (TYLENOL) tablet 650 mg  650 mg Oral Q6H PRN Cristescu, Mircea G, MD      . albuterol (PROVENTIL) (2.5 MG/3ML) 0.083% nebulizer solution 2.5 mg  2.5 mg Inhalation Q6H PRN Cristescu, Mircea G, MD      . aspirin EC tablet 81 mg  81 mg Oral Daily Cristescu, Mircea G, MD   81 mg at 11/19/19 0948  . atorvastatin (LIPITOR) tablet 10 mg  10 mg Oral q1800 Cristescu, Mircea G, MD      . calcitRIOL (ROCALTROL) capsule 0.25 mcg  0.25 mcg Oral Daily Cristescu, Mircea G, MD   0.25 mcg at 11/19/19 0957  . carvedilol (COREG) tablet 25 mg  25 mg Oral BID WC Cristescu, Mircea G, MD      . feeding supplement (PRO-STAT SUGAR FREE 64) liquid 30 mL  30 mL Oral BID Cristescu, Mircea G, MD   30 mL at 11/19/19 0953  . furosemide (LASIX) tablet 80 mg  80 mg Oral Daily Cristescu, Linard Millers, MD   80 mg at 11/19/19 0948  . gabapentin (NEURONTIN) capsule 100 mg  100 mg Oral QHS Cristescu, Mircea G, MD      . heparin injection 5,000 Units  5,000 Units Subcutaneous Q8H Cristescu,  Linard Millers, MD   5,000 Units at 11/19/19 0953  . hydrALAZINE (APRESOLINE) injection 10 mg  10  mg Intravenous Q4H PRN Cristescu, Mircea G, MD      . hydrALAZINE (APRESOLINE) tablet 100 mg  100 mg Oral BID Toy Baker, MD   100 mg at 11/19/19 0948  . isosorbide mononitrate (IMDUR) 24 hr tablet 15 mg  15 mg Oral Daily Cristescu, Linard Millers, MD   15 mg at 11/19/19 0948  . labetalol (NORMODYNE) injection 10 mg  10 mg Intravenous Q4H PRN Pahwani, Einar Grad, MD      . lisinopril (ZESTRIL) tablet 40 mg  40 mg Oral Daily Cristescu, Linard Millers, MD   40 mg at 11/19/19 0948  . magnesium sulfate IVPB 2 g 50 mL  2 g Intravenous Once Darliss Cheney, MD      . ondansetron (ZOFRAN) tablet 4 mg  4 mg Oral Q6H PRN Cristescu, Mircea G, MD       Or  . ondansetron (ZOFRAN) injection 4 mg  4 mg Intravenous Q6H PRN Cristescu, Mircea G, MD      . oxyCODONE (Oxy IR/ROXICODONE) immediate release tablet 5 mg  5 mg Oral Q4H PRN Carlisle Cater, PA-C   5 mg at 11/18/19 2345  . sevelamer carbonate (RENVELA) tablet 800 mg  800 mg Oral TID WC Cristescu, Mircea G, MD      . sodium chloride flush (NS) 0.9 % injection 3 mL  3 mL Intravenous Q12H Cristescu, Mircea G, MD      . sodium chloride flush (NS) 0.9 % injection 3 mL  3 mL Intravenous Q12H Cristescu, Mircea G, MD      . sodium chloride flush (NS) 0.9 % injection 3 mL  3 mL Intravenous PRN Cristescu, Mircea G, MD      . spironolactone (ALDACTONE) tablet 25 mg  25 mg Oral Daily Cristescu, Linard Millers, MD   25 mg at 11/19/19 0948   Labs: Basic Metabolic Panel: Recent Labs  Lab 11/18/19 1734 11/18/19 2001 11/19/19 0609  NA 132* 133* 135  K 2.0* 2.4* 2.6*  CL 93*  --  99  CO2 27  --  23  GLUCOSE 218*  --  151*  BUN 45*  --  42*  CREATININE 6.29*  --  6.34*  CALCIUM 8.5*  --  8.4*   Liver Function Tests: Recent Labs  Lab 11/18/19 1734  AST 17  ALT 14  ALKPHOS 67  BILITOT 0.5  PROT 6.1*  ALBUMIN 2.3*    Recent Labs  Lab 11/18/19 1734  AMMONIA 22   CBC: Recent  Labs  Lab 11/18/19 1734 11/18/19 2001  WBC 15.8*  --   NEUTROABS 12.6*  --   HGB 8.4* 8.2*  HCT 24.7* 24.0*  MCV 91.8  --   PLT 159  --    CBG: Recent Labs  Lab 11/18/19 2254  GLUCAP 159*   Studies/Results: DG Chest 2 View  Result Date: 11/18/2019 CLINICAL DATA:  Abdominal pain, weakness EXAM: CHEST - 2 VIEW COMPARISON:  02/09/2019 FINDINGS: Heart and mediastinal contours are within normal limits. No focal opacities or effusions. No acute bony abnormality. IMPRESSION: No active cardiopulmonary disease. Electronically Signed   By: Rolm Baptise M.D.   On: 11/18/2019 17:14   CT Head Wo Contrast  Result Date: 11/18/2019 CLINICAL DATA:  Encephalopathy.  Altered mental status. EXAM: CT HEAD WITHOUT CONTRAST TECHNIQUE: Contiguous axial images were obtained from the base of the skull through the vertex without intravenous contrast. COMPARISON:  None. FINDINGS: Brain: No acute intracranial abnormality. Specifically, no hemorrhage, hydrocephalus, mass lesion, acute infarction, or significant intracranial  injury. Vascular: No hyperdense vessel or unexpected calcification. Skull: No acute calvarial abnormality. Sinuses/Orbits: Visualized paranasal sinuses and mastoids clear. Orbital soft tissues unremarkable. Other: None IMPRESSION: No acute intracranial abnormality. Electronically Signed   By: Rolm Baptise M.D.   On: 11/18/2019 17:22    ROS: Not completed due to AMS  Physical Exam: Vitals:   11/19/19 0130 11/19/19 0230 11/19/19 0300 11/19/19 0438  BP: (!) 166/124 (!) 174/145 (!) 132/103 (!) 194/79  Pulse: 90 92 94 89  Resp: (!) 22 16 17 16   Temp:    98.3 F (36.8 C)  TempSrc:    Oral  SpO2: 100% 100% 100% 100%  Weight:      Height:         General: WDWN NAD, chronically ill appearing female Head: NCAT sclera not icteric MMM Neck: Supple. No lymphadenopathy Lungs: CTA bilaterally. No wheeze, rales or rhonchi. Breathing is unlabored. Heart: RRR. No murmur, rubs or gallops.  Abdomen:  soft, nontender, +BS, no guarding, no rebound tenderness Lower extremities:no edema, ischemic changes, or open wounds  Neuro: Alert. Confused. Moves all extremities spontaneously. Dialysis Access: PD cath in RLQ  Dialysis Orders:  CCPD 7xwk, EDW 68.5kg 5 exchanges Fill Vol 2.4L, Dwell time 1.5hr No last fill/day exchange  Assessment/Plan: 1.  AMS - CT head negative. UA not indicative of infection. Possibly related to uremia but BUN/SCr is close to baseline.  Checking BC. Will get culture on PD fluid to r/o peritonitis. WBC 15.8. Per primary  2. Hypokalemia - K 2.0 on admit.  K supplemented.  Recheck ordered.  3.  ESRD -  On PD.  Plan to transition to HD this week as OP.  Last PD on Thursday.  Order written to have temp cath placed tomorrow AM followed by dialysis.  Will have temp cath changed to tunneled dialysis catheter if [facility] are negative.   4.  Hypertension/volume  - Blood pressure elevated, continue home meds.  Does not appear volume overloaded.  Plan for minimal UF with initial dialysis.   5.  Anemia of CKD - Hgb 8.4. last mircera given on 12/15. Order aranesp 19mg with HD.  6.  Secondary Hyperparathyroidism -  Ca at goal. Will check phos. Not on binders or VDRA. 7.  Nutrition - currently NPO. Regular diet once advanced due to hypokalemia.    LJen Mow PA-C [facility] Pager: [phone]/04/2020, 10:55 AM   Nephrology attending:  Patient was seen and examined at bedside.  Chart reviewed.  I agree with assessment and plan as outlined above.  64year old female ESRD on PD admitted with altered mental status, hyperkalemia.  She has problem with hernia and sigmoid colon infection and supposed to have surgery.  The renal plan was to convert from PD to HD and then [facility] Discussed with the patient and her daughter at bedside.  IR consulted for HD catheter placement.  Pending blood culture and leukocytosis IR will  place temporary HD catheter.  We will check peritoneal fluid for cell count and culture.  First HD tomorrow. Replete potassium chloride for hypokalemia.  DLawson Radar MD [facility].

## 2019-11-19 NOTE — Consult Note (Addendum)
Patient Status: [facility] - In-pt  Assessment and Plan: Patient in need of venous access.  Non tunneled dialysis catheter  ______________________________________________________________________   History of Present Illness: Patricia [name] is a 64 y.o. female History of ESRD s/p pd catheter.  Admitted for acute metabolic encephalopathy and missing several sessions of dislaysis. Team is requesting HD cathter placement. As patient has a elevated WBC (15.3) and pending blood cultures  From 2.6.21 recommend non tunneled catheter placement.  Allergies and medications reviewed.   Review of Systems: A 12 point ROS discussed and pertinent positives are indicated in the HPI above.  All other systems are negative.  Review of Systems  Unable to perform ROS: Patient nonverbal    Vital Signs: BP (!) 158/69 (BP Location: Left Arm)   Pulse (!) 101   Temp 99.4 F (37.4 C) (Oral)   Resp 16   Ht 5\' 4"  (1.626 m)   Wt 151 lb 12.8 oz (68.9 kg)   SpO2 100%   BMI 26.06 kg/m   Physical Exam Vitals and nursing note reviewed.  Constitutional:      General: She is not in acute distress. HENT:     Head: Normocephalic and atraumatic.  Eyes:     Conjunctiva/sclera: Conjunctivae normal.  Cardiovascular:     Rate and Rhythm: Normal rate and regular rhythm.  Pulmonary:     Effort: Pulmonary effort is normal.     Breath sounds: Normal breath sounds.  Musculoskeletal:        General: Normal range of motion.     Cervical back: Normal range of motion.  Skin:    General: Skin is warm.     Comments: drain site is unremarkable with no erythema, tenderness or drainage noted. Suture and stat lock in place. Dressing is clean dry and intact. 1 ml of  flush colored fluid noted in the line of  bulb suction device. Drain removed at bedside intact. MD assisted at bedside due to retained suture from locking mechanism.    Neurological:     Mental Status: She is oriented to person, place, and time.      Imaging  reviewed.  Chest xray from 2.6.21 shows no lines or drains  Labs:  COAGS: Recent Labs    11/18/19 1734  INR 1.2  APTT 47*    BMP: Recent Labs    10/18/19 0238 10/18/19 0238 10/18/19 2233 11/18/19 1734 11/18/19 2001 11/19/19 0609  NA 132*  132*   < > 133* 132* 133* 135  K 3.8  3.8   < > 4.1 2.0* 2.4* 2.6*  CL 98  96*  --  95* 93*  --  99  CO2 26  25  --  27 27  --  23  GLUCOSE 389*  386*  --  228* 218*  --  151*  BUN 13  13  --  18 45*  --  42*  CALCIUM 8.5*  8.5*  --  9.0 8.5*  --  8.4*  CREATININE 5.74*  5.88*  --  6.49* 6.29*  --  6.34*  GFRNONAA 7*  7*  --  6* 7*  --  7*  GFRAA 9*  8*  --  7* 8*  --  8*   < > = values in this interval not displayed.    Patient tentatively scheduled for procedure for 2.8.21.  IR will call patient when ready.  Risks and benefits of dialysis catheter placement was discussed with the patient and/or patient's family including,  but not limited to bleeding, infection, damage to adjacent structures or low yield requiring additional tests.  All of the questions were answered and there is agreement to proceed.  Telephone consent obtained and consent signed and in IR control room    Electronically Signed: Avel Peace, NP 11/19/2019, 11:38 AM   I spent a total of 15 minutes in face to face in clinical consultation, greater than 50% of which was counseling/coordinating care for venous access.

## 2019-11-19 NOTE — Progress Notes (Signed)
64 year old lady who was admitted early this morning due to acute metabolic encephalopathy and missing several days of peritoneal dialysis starting Thursday and electrolyte abnormalities.  Patient seen and examined.  Patient was alert and seems to be oriented as well however she did not want to talk much giving me the impression of perhaps moderate to severe depression instead of acute metabolic encephalopathy.  General physical examination was completely unremarkable.  I have notified Dr. Carolin Sicks of nephrology about patient being admitted here.  Reportedly, there was a plan for her to transition to hemodialysis from peritoneal dialysis on Monday as outpatient.  Will defer to nephrology to make those calls to vascular surgery.  For hypokalemia I have replaced some potassium.  We will also replace magnesium.  I tried to call patient's daughter listed in the chart to get some background and baseline mental status for her and left a voicemail.

## 2019-11-19 NOTE — ED Notes (Signed)
[facility] at bedside 

## 2019-11-19 NOTE — Progress Notes (Signed)
CRITICAL VALUE ALERT  Critical Value:  Potassium 2.6    Date & Time Notied:  11/19/2019 0746  Provider Notified: Pahwani  Orders Received/Actions taken: Awaiting response, oncoming RN aware

## 2019-11-19 NOTE — H&P (Signed)
History and Physical    Patricia [name] LSL:[id number] DOB: 09-11-1956 DOA: 11/18/2019  PCP: Ladell Pier, MD    Patient coming from: home    Chief Complaint: Change in mental status, strange behavior, lethargy  HPI: Patricia [name] is a 64 y.o. female with medical history significant of end-stage renal disease, diabetes mellitus type 2, hypertension, chronic anemia, congestive heart failure, depression, recent history of diverticulitis on antibiotics, history of periumbilical tachypnea, was transferred to our hospital for change in mental status. Patient with end-stage renal disease on peritoneal dialysis at home apparently had hard time to access the dialysis catheter and dialysis Thursday night.  Since then she missed dialysis and per family member she is more confused has a strange behavior and increased anxiety. She is scheduled to have a catheter insertion Monday for transition to hemodialysis She was also scheduled to have a surgery for robotic sigmoidectomy and umbilical hernia repair which was post point because of the patient status   and move the appointment later on. Patient was recently in the hospital for diverticulitis and is still on antibiotic Augmentin and Flagyl  ED Course:  In the emergency room at [facility] Had a CT scan which was negative for acute pathology Potassium was 2.0, magnesium was normal BUN 45 creatinine 6.29 Blood sugar 218 White count 15.8 hemoglobin 8.4 sodium 132 Electrocardiogram normal sinus with LVH prolonged QT interval In the emergency room was given oxycodone Zestril 40 mg potassium chloride 40 mEq and then 10 mEq Emergency room physician discussed the case with nephrology DR Carolin Sicks who accepted the transfer and will see the patient in the morning  Review of Systems: As per HPI otherwise 10 point review of systems negative.  With exception patient is confused  Past Medical History:  Diagnosis Date  . Allergy   . Anemia   .  Anxiety   . Blood transfusion without reported diagnosis   . Cardiomyopathy ([place]) 11/2016   no ischemic eval due to CKD  . CHF (congestive heart failure) ([place])   . CKD (chronic kidney disease), stage IV ([place])   . Depression   . Diabetes mellitus without complication ([place])    type 2  . Diverticulitis   . Dyspnea   . Hypertension   . Obesity     Past Surgical History:  Procedure Laterality Date  . ABDOMINAL HYSTERECTOMY    . BREAST SURGERY     reduction  . Mucarabones  . LAPAROSCOPY N/A 06/16/2019   Procedure: primary closure of umbilical hernia;  Surgeon: Ralene Ok, MD;  Location: [facility];  Service: General;  Laterality: N/A;  . LIPOMA EXCISION     back  Dr. Excell Seltzer 03-22-18  . LIPOMA EXCISION N/A 03/22/2018   Procedure: EXCISION OF BACK LIPOMA;  Surgeon: Excell Seltzer, MD;  Location: [facility];  Service: General;  Laterality: N/A;  . REDUCTION MAMMAPLASTY Bilateral      reports that she has never smoked. She has never used smokeless tobacco. She reports that she does not drink alcohol or use drugs.  Allergies  Allergen Reactions  . Codone [Hydrocodone] Itching  . Norvasc [Amlodipine Besylate] Swelling    Lower extremity  . Hydralazine Swelling and Other (See Comments)    Leg swelling. Pt takes this as o/p  . Sulfa Antibiotics Itching and Rash    Family History  Problem Relation Age of Onset  . Diabetes Brother   . Stomach cancer Brother   . Kidney disease  Brother   . Heart Problems Maternal Grandmother        pacemaker  . Heart disease Maternal Grandfather   . Rectal cancer Neg Hx   . Esophageal cancer Neg Hx   . Liver cancer Neg Hx   . Colon cancer Neg Hx      Prior to Admission medications   Medication Sig Start Date End Date Taking? Authorizing Provider  ACCU-CHEK FASTCLIX LANCETS MISC Uad tid 10/21/18   Ladell Pier, MD  acetaminophen (TYLENOL) 325 MG tablet Take 2 tablets (650 mg total) by mouth every 6 (six) hours as  needed. Patient taking differently: Take 650 mg by mouth every 6 (six) hours as needed for headache (pain).  05/25/19   Varney Biles, MD  albuterol (PROVENTIL HFA;VENTOLIN HFA) 108 (90 Base) MCG/ACT inhaler Inhale 1-2 puffs into the lungs every 6 (six) hours as needed for wheezing or shortness of breath. 10/11/18   Marney Setting, NP  Amino Acids-Protein Hydrolys (FEEDING SUPPLEMENT, PRO-STAT SUGAR FREE 64,) LIQD Take 30 mLs by mouth 2 (two) times daily. 10/18/19   Swayze, Ava, DO  amoxicillin-clavulanate (AUGMENTIN) 875-125 MG tablet Take 1 tablet by mouth every 12 (twelve) hours. 10/18/19   Swayze, Ava, DO  aspirin EC 81 MG tablet Take 1 tablet (81 mg total) by mouth daily. 01/04/17   Maren Reamer, MD  atorvastatin (LIPITOR) 20 MG tablet Take 1/2 (one-half) tablet by mouth once daily Patient taking differently: Take 10 mg by mouth daily.  06/12/19   Ladell Pier, MD  Blood Glucose Monitoring Suppl (ACCU-CHEK GUIDE) w/Device KIT 1 each by Does not apply route 3 (three) times daily. Use tid as directed 10/21/18   Ladell Pier, MD  calcitRIOL (ROCALTROL) 0.25 MCG capsule Take 0.25 mcg by mouth daily. 09/12/19   [provider]  carvedilol (COREG) 25 MG tablet TAKE 1 TABLET BY MOUTH TWICE DAILY WITH A MEAL Patient taking differently: Take 25 mg by mouth 2 (two) times daily with a meal.  05/04/19   Croitoru, Mihai, MD  furosemide (LASIX) 80 MG tablet Take 1 tablet (80 mg total) by mouth daily. 10/18/19   Swayze, Ava, DO  gabapentin (NEURONTIN) 100 MG capsule TAKE 1 CAPSULE (100 MG TOTAL) BY MOUTH AT BEDTIME. 12/05/18   Ladell Pier, MD  gentamicin cream (GARAMYCIN) 0.1 % Apply 1 application topically See admin instructions. Apply topically to dialysis site after showering - every other day 06/10/19   [provider]  glucose blood (ACCU-CHEK GUIDE) test strip Use as instructed tid 10/21/18   Ladell Pier, MD  hydrALAZINE (APRESOLINE) 100 MG tablet Take 1 tablet  (100 mg total) by mouth 2 (two) times daily. 06/20/19 10/10/19  [place] Donnamarie Poag, DO  insulin aspart (NOVOLOG) 100 UNIT/ML injection 3 units before meals. Patient not taking: Reported on 10/10/2019 04/06/17   Ladell Pier, MD  Insulin Glargine (LANTUS SOLOSTAR) 100 UNIT/ML Solostar Pen Inject 6 Units into the skin at bedtime. 10/18/19   Swayze, Ava, DO  Insulin Pen Needle (TRUEPLUS PEN NEEDLES) 32G X 4 MM MISC Use as directed to inject insulin. 03/25/18   Ladell Pier, MD  Insulin Pen Needle 31G X 5 MM MISC Use as directed 03/28/18   Ladell Pier, MD  isosorbide mononitrate (IMDUR) 30 MG 24 hr tablet Take 1/2 (one-half) tablet by mouth once daily Patient taking differently: Take 15 mg by mouth daily.  06/12/19   Croitoru, Mihai, MD  lisinopril (ZESTRIL) 40 MG tablet Take  40 mg by mouth daily. 09/19/19   [provider]  metroNIDAZOLE (FLAGYL) 500 MG tablet Take 1 tablet (500 mg total) by mouth every 8 (eight) hours. 10/18/19   Swayze, Ava, DO  oxyCODONE (OXY IR/ROXICODONE) 5 MG immediate release tablet Take 1 tablet (5 mg total) by mouth every 4 (four) hours as needed for moderate pain or breakthrough pain (Hold & Call MD if SBP<90, HR<65, RR<10, O2<90, or altered mental status.). 10/18/19   Swayze, Ava, DO  Polyethyl Glycol-Propyl Glycol (SYSTANE) 0.4-0.3 % SOLN Place 1 drop into both eyes 3 (three) times daily as needed (dry eyes).     [provider]  sevelamer (RENAGEL) 800 MG tablet Take 1,600-3,200 mg by mouth See admin instructions. Take 2-4 tablets (1600-3200 mg) by mouth up to three times daily with meals - 2-3 tablets with small meal, 4 tablets with large meal    [provider]  spironolactone (ALDACTONE) 25 MG tablet Take 25 mg by mouth daily. 09/19/19   [provider]    Physical Exam: Vitals:   11/19/19 0130 11/19/19 0230 11/19/19 0300 11/19/19 0438  BP: (!) 166/124 (!) 174/145 (!) 132/103 (!) 194/79  Pulse: 90 92 94 89  Resp: (!) 22 16 17 16    Temp:    98.3 F (36.8 C)  TempSrc:    Oral  SpO2: 100% 100% 100% 100%  Weight:      Height:        Constitutional: NAD, calm, comfortable Vitals:   11/19/19 0130 11/19/19 0230 11/19/19 0300 11/19/19 0438  BP: (!) 166/124 (!) 174/145 (!) 132/103 (!) 194/79  Pulse: 90 92 94 89  Resp: (!) 22 16 17 16   Temp:    98.3 F (36.8 C)  TempSrc:    Oral  SpO2: 100% 100% 100% 100%  Weight:      Height:       Eyes: PERRL, lids and conjunctivae normal ENMT: Mucous membranes are moist. Posterior pharynx clear of any exudate or lesions.Normal dentition.  Neck: normal, supple, no masses, no thyromegaly Respiratory: clear to auscultation bilaterally, no wheezing, no crackles. Normal respiratory effort. No accessory muscle use.  Cardiovascular: Regular rate and rhythm, no murmurs / rubs / gallops. No extremity edema. 2+ pedal pulses. No carotid bruits.  Abdomen: no tenderness, no masses palpated. No hepatosplenomegaly. Bowel sounds positive.  Catheter for peritoneal dialysis right lower quadrant Musculoskeletal: no clubbing / cyanosis. No joint deformity upper and lower extremities. Good ROM, no contractures. Normal muscle tone.  Skin: no rashes, lesions, ulcers. No induration Neurologic: CN 2-12 grossly intact. Sensation intact, DTR normal. Strength 5/5 in all 4.  Psychiatric: Unable to perform because of the patient mental status, patient is confused and disoriented off and on   Labs on Admission: I have personally reviewed following labs and imaging studies  CBC: Recent Labs  Lab 11/18/19 1734 11/18/19 2001  WBC 15.8*  --   NEUTROABS 12.6*  --   HGB 8.4* 8.2*  HCT 24.7* 24.0*  MCV 91.8  --   PLT 159  --    Basic Metabolic Panel: Recent Labs  Lab 11/18/19 1734 11/18/19 2001  NA 132* 133*  K 2.0* 2.4*  CL 93*  --   CO2 27  --   GLUCOSE 218*  --   BUN 45*  --   CREATININE 6.29*  --   CALCIUM 8.5*  --   MG 1.9  --    GFR: Estimated Creatinine Clearance: 9 mL/min (A) (by  C-G  formula based on SCr of 6.29 mg/dL (H)). Liver Function Tests: Recent Labs  Lab 11/18/19 1734  AST 17  ALT 14  ALKPHOS 67  BILITOT 0.5  PROT 6.1*  ALBUMIN 2.3*   No results for input(s): LIPASE, AMYLASE in the last 168 hours. Recent Labs  Lab 11/18/19 1734  AMMONIA 22   Coagulation Profile: Recent Labs  Lab 11/18/19 1734  INR 1.2   Cardiac Enzymes: No results for input(s): CKTOTAL, CKMB, CKMBINDEX, TROPONINI in the last 168 hours. BNP (last 3 results) No results for input(s): PROBNP in the last 8760 hours. HbA1C: No results for input(s): HGBA1C in the last 72 hours. CBG: Recent Labs  Lab 11/18/19 2254  GLUCAP 159*   Lipid Profile: No results for input(s): CHOL, HDL, LDLCALC, TRIG, CHOLHDL, LDLDIRECT in the last 72 hours. Thyroid Function Tests: No results for input(s): TSH, T4TOTAL, FREET4, T3FREE, THYROIDAB in the last 72 hours. Anemia Panel: No results for input(s): VITAMINB12, FOLATE, FERRITIN, TIBC, IRON, RETICCTPCT in the last 72 hours. Urine analysis:    Component Value Date/Time   COLORURINE YELLOW 11/19/2019 0035   APPEARANCEUR CLEAR 11/19/2019 0035   LABSPEC 1.015 11/19/2019 0035   PHURINE 6.5 11/19/2019 0035   GLUCOSEU >=500 (A) 11/19/2019 0035   HGBUR NEGATIVE 11/19/2019 0035   BILIRUBINUR NEGATIVE 11/19/2019 0035   BILIRUBINUR 1.0 10/09/2016 1306   KETONESUR NEGATIVE 11/19/2019 0035   PROTEINUR >300 (A) 11/19/2019 0035   UROBILINOGEN 0.2 04/24/2018 1529   NITRITE NEGATIVE 11/19/2019 0035   LEUKOCYTESUR NEGATIVE 11/19/2019 0035    Radiological Exams on Admission: DG Chest 2 View  Result Date: 11/18/2019 CLINICAL DATA:  Abdominal pain, weakness EXAM: CHEST - 2 VIEW COMPARISON:  02/09/2019 FINDINGS: Heart and mediastinal contours are within normal limits. No focal opacities or effusions. No acute bony abnormality. IMPRESSION: No active cardiopulmonary disease. Electronically Signed   By: Rolm Baptise M.D.   On: 11/18/2019 17:14   CT Head Wo  Contrast  Result Date: 11/18/2019 CLINICAL DATA:  Encephalopathy.  Altered mental status. EXAM: CT HEAD WITHOUT CONTRAST TECHNIQUE: Contiguous axial images were obtained from the base of the skull through the vertex without intravenous contrast. COMPARISON:  None. FINDINGS: Brain: No acute intracranial abnormality. Specifically, no hemorrhage, hydrocephalus, mass lesion, acute infarction, or significant intracranial injury. Vascular: No hyperdense vessel or unexpected calcification. Skull: No acute calvarial abnormality. Sinuses/Orbits: Visualized paranasal sinuses and mastoids clear. Orbital soft tissues unremarkable. Other: None IMPRESSION: No acute intracranial abnormality. Electronically Signed   By: Rolm Baptise M.D.   On: 11/18/2019 17:22    EKG: Independently reviewed.  Normal sinus LVH no acute ST-T changes  Assessment and plan  Acute encephalopathy Patient more lethargic than usual Acting strange sometimes she understands the question and answered correctly, sometimes she is confused does not know her age, the name of the hospital, the year associated with anxiety for further surgery CT head negative for acute pathology Plan nephrology consult, IV reaccessed, on dialysis, not sure how much is related to uremic syndrome Patient did not have peritoneal dialysis since Thursday  End-stage renal disease on peritoneal dialysis at home Apparently had hard time to access the dialysis catheter Thursday No more dialysis since then Plan nephrology consult and transition to hemodialysis  Hypokalemia Potassium was 2 before the transfer Patient received 40 mEq of potassium chloride and another 10 mEq Nephrology states is very common peritoneal dialysis to be hypokalemic Plan stat BMP, patient did not have any EKG changes  Patient with history of sigmoid  diverticulitis and periumbilical hernia Supposed to have surgery next week, the surgery was postponed.  Because of the patient mental  status We will resume the Augmentin and Flagyl  Essential hypertension Lisinopril, hydralazine p.o. 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 Calcitrol, check PTH, vitamin D level  Hyperphosphatemia Renvela  Chronic anemia Secondary to end-stage renal disease Anemia panel, erythropoietin   Diabetes mellitus type 2 with neuropathy Insulin sliding scale tonight, patient on Lantus 6 units at bedtime with sliding scale per sensitivity factor   Assessment/Plan Principal Problem:   Acute encephalopathy Active Problems:   Dyslipidemia   Hypertension associated with diabetes (HCC)   CKD (chronic kidney disease) stage 5, GFR less than 15 ml/min (HCC)   Anemia due to stage 5 chronic kidney disease, not on chronic dialysis (HCC)   Type II diabetes mellitus with renal manifestations ([facility])   Chronic combined systolic and diastolic CHF (congestive heart failure) (H[facility])   Hypokalemia   Periumbilical hernia   ESRD on peritoneal dialysis ([facility])   Sigmoid diverticulitis   Generalized weakness      DVT prophylaxis: Heparin subcu Code Status: Full code Family Communication: Needs to update in the morning Disposition Plan: Home Consults called: Need to be called DR Carolin Sicks he was notified by physician who did the transfer and agreed to see the patient in the morning Need to notify of vascular surgery for catheter placement Patient is n.p.o. except medication which will start at 10 AM/if no access today she can start to eat Admission status: Full admission   Mckade Gurka G Audon Heymann MD [facility]  If 7PM-7AM, please contact  night-coverage [email or web address]   11/19/2019, 6:35 AM

## 2019-11-20 ENCOUNTER — Inpatient Hospital Stay (HOSPITAL_COMMUNITY): Payer: Medicare HMO

## 2019-11-20 DIAGNOSIS — Z992 Dependence on renal dialysis: Secondary | ICD-10-CM | POA: Diagnosis not present

## 2019-11-20 DIAGNOSIS — N186 End stage renal disease: Secondary | ICD-10-CM | POA: Diagnosis not present

## 2019-11-20 DIAGNOSIS — N2581 Secondary hyperparathyroidism of renal origin: Secondary | ICD-10-CM | POA: Diagnosis not present

## 2019-11-20 DIAGNOSIS — F332 Major depressive disorder, recurrent severe without psychotic features: Secondary | ICD-10-CM | POA: Diagnosis present

## 2019-11-20 HISTORY — PX: IR US GUIDE VASC ACCESS RIGHT: IMG2390

## 2019-11-20 HISTORY — PX: IR FLUORO GUIDE CV LINE RIGHT: IMG2283

## 2019-11-20 LAB — COMPREHENSIVE METABOLIC PANEL
ALT: 12 U/L (ref 0–44)
AST: 13 U/L — ABNORMAL LOW (ref 15–41)
Albumin: 1.8 g/dL — ABNORMAL LOW (ref 3.5–5.0)
Alkaline Phosphatase: 43 U/L (ref 38–126)
Anion gap: 11 (ref 5–15)
BUN: 46 mg/dL — ABNORMAL HIGH (ref 8–23)
CO2: 22 mmol/L (ref 22–32)
Calcium: 8.3 mg/dL — ABNORMAL LOW (ref 8.9–10.3)
Chloride: 104 mmol/L (ref 98–111)
Creatinine, Ser: 6.45 mg/dL — ABNORMAL HIGH (ref 0.44–1.00)
GFR calc Af Amer: 7 mL/min — ABNORMAL LOW (ref >60)
GFR calc non Af Amer: 6 mL/min — ABNORMAL LOW (ref >60)
Glucose, Bld: 169 mg/dL — ABNORMAL HIGH (ref 70–99)
Potassium: 3.8 mmol/L (ref 3.5–5.1)
Sodium: 137 mmol/L (ref 135–145)
Total Bilirubin: 0.4 mg/dL (ref 0.3–1.2)
Total Protein: 5.3 g/dL — ABNORMAL LOW (ref 6.5–8.1)

## 2019-11-20 LAB — CBC WITH DIFFERENTIAL/PLATELET
Abs Immature Granulocytes: 0.09 10*3/uL — ABNORMAL HIGH (ref 0.00–0.07)
Basophils Absolute: 0 10*3/uL (ref 0.0–0.1)
Basophils Relative: 0 %
Eosinophils Absolute: 0.1 10*3/uL (ref 0.0–0.5)
Eosinophils Relative: 0 %
HCT: 23.2 % — ABNORMAL LOW (ref 36.0–46.0)
Hemoglobin: 7.6 g/dL — ABNORMAL LOW (ref 12.0–15.0)
Immature Granulocytes: 1 %
Lymphocytes Relative: 16 %
Lymphs Abs: 1.8 10*3/uL (ref 0.7–4.0)
MCH: 30.4 pg (ref 26.0–34.0)
MCHC: 32.8 g/dL (ref 30.0–36.0)
MCV: 92.8 fL (ref 80.0–100.0)
Monocytes Absolute: 0.6 10*3/uL (ref 0.1–1.0)
Monocytes Relative: 6 %
Neutro Abs: 8.9 10*3/uL — ABNORMAL HIGH (ref 1.7–7.7)
Neutrophils Relative %: 77 %
Platelets: 165 10*3/uL (ref 150–400)
RBC: 2.5 MIL/uL — ABNORMAL LOW (ref 3.87–5.11)
RDW: 14 % (ref 11.5–15.5)
WBC: 11.6 10*3/uL — ABNORMAL HIGH (ref 4.0–10.5)
nRBC: 0 % (ref 0.0–0.2)

## 2019-11-20 LAB — PHOSPHORUS: Phosphorus: 4.4 mg/dL (ref 2.5–4.6)

## 2019-11-20 LAB — MAGNESIUM: Magnesium: 2.6 mg/dL — ABNORMAL HIGH (ref 1.7–2.4)

## 2019-11-20 LAB — GLUCOSE, CAPILLARY
Glucose-Capillary: 125 mg/dL — ABNORMAL HIGH (ref 70–99)
Glucose-Capillary: 134 mg/dL — ABNORMAL HIGH (ref 70–99)
Glucose-Capillary: 116 mg/dL — ABNORMAL HIGH (ref 70–99)
Glucose-Capillary: 117 mg/dL — ABNORMAL HIGH (ref 70–99)

## 2019-11-20 LAB — TSH: TSH: 0.402 u[IU]/mL (ref 0.350–4.500)

## 2019-11-20 MED ORDER — INSULIN ASPART 100 UNIT/ML ~~LOC~~ SOLN
0.0000 [IU] | Freq: Three times a day (TID) | SUBCUTANEOUS | Status: DC
  Administered 2019-11-20: 1 [IU] via SUBCUTANEOUS
  Administered 2019-11-24 (×2): 2 [IU] via SUBCUTANEOUS
  Administered 2019-11-24: 5 [IU] via SUBCUTANEOUS
  Administered 2019-11-25: 2 [IU] via SUBCUTANEOUS
  Administered 2019-11-25: 3 [IU] via SUBCUTANEOUS

## 2019-11-20 MED ORDER — HEPARIN SODIUM (PORCINE) 1000 UNIT/ML IJ SOLN
INTRAMUSCULAR | Status: AC
  Administered 2019-11-20: 2.6 mL
  Filled 2019-11-20: qty 1

## 2019-11-20 MED ORDER — INSULIN ASPART 100 UNIT/ML ~~LOC~~ SOLN
0.0000 [IU] | Freq: Every day | SUBCUTANEOUS | Status: DC
  Administered 2019-11-23: 2 [IU] via SUBCUTANEOUS

## 2019-11-20 MED ORDER — LIDOCAINE HCL 1 % IJ SOLN
INTRAMUSCULAR | Status: AC
  Filled 2019-11-20: qty 20

## 2019-11-20 MED ORDER — LIDOCAINE HCL (PF) 1 % IJ SOLN
INTRAMUSCULAR | Status: AC | PRN
  Administered 2019-11-20: 5 mL

## 2019-11-20 NOTE — Progress Notes (Signed)
Informed Dillon Bjork, Rn HD Tx moved to 11/21/2019 per Dr. Jonnie Finner.

## 2019-11-20 NOTE — Progress Notes (Signed)
Subjective:  No voiced cos , eyes closed  wiiil not answer any questions / daughter in Room/   Objective Vital signs in last 24 hours: Vitals:   11/20/19 0544 11/20/19 0830 11/20/19 1017 11/20/19 1020  BP: (!) 160/80 (!) 182/77 (!) 177/75   Pulse: 89 94 89   Resp: 15 16  16   Temp: 98.2 F (36.8 C) 98 F (36.7 C) 99.1 F (37.3 C)   TempSrc:  Oral Oral   SpO2: 98% 98% 97%   Weight:      Height:       Weight change:   Physical Exam: General: WDWN NAD, chronically ill appearing female Neck: Supple. No lymphadenopathy Lungs: CTA bilaterally. No wheeze, rales or rhonchi. Breathing is unlabored. Heart: RRR. No murmur, rubs or gallops.  Abdomen: soft, nontender, +BS, no guarding, no rebound tenderness, Pd cath present Right lower abd  Dressing clean / dry non tender  Lower extremities:no edema, ischemic changes, or open wounds  Neuro:  Appearing mute ,not answering questions or opening eyes to request  Dialysis Access: PD cath in RLQ   OP Dialysis Orders:  CCPD 7xwk, EDW 68.5kg 5 exchanges Fill Vol 2.4L, Dwell time 1.5hr No last fill/day exchange  Problem/Plan:: 1.  AMS - CT head negative. UA not indicative of infection. Possibly related to uremia but BUN/SCr is close to baseline.  Checking BC. Pending  culture on PD fluid to r/o peritonitis no Growth to date  WBC 15.8 admit to 11.6 this am [facility]U Per primary / noted Psy to see pt .  2. Hypokalemia - K 2.0 on admit.to  3.8 this am after /  K supplemented.  3.  ESRD -  On PD.  Plan to transition to HD this week as OP.  Last PD on Thursday.  For  temp cath placed today  AM followed by dialysis.  Will have temp cath changed to tunneled dialysis catheter if [facility] are negative.   4.  Hypertension/volume  - Blood pressure elevated, continue home meds.  Does not appear volume overloaded.  Plan for minimal UF with initial dialysis.   5.  Anemia of CKD - Hgb 8.4.admit > 7.4 last mircera given on 12/15. Order aranesp 137mcg with HD.  6.  Secondary  Hyperparathyroidism -  Ca at goal. Phos 4.4  Not on binders or VDRA. 7. Nutrition - currently NPO. Regular diet once advanced due to hypokalemia.    Ernest Haber, PA-C [facility] Beeper [phone] 11/20/2019,1:26 PM  LOS: 1 day   Labs: Basic Metabolic Panel: Recent Labs  Lab 11/18/19 1734 11/18/19 1734 11/18/19 2001 11/18/19 2001 11/19/19 0609 11/19/19 1208 11/20/19 0451  NA 132*   < > 133*  --  135  --  137  K 2.0*   < > 2.4*   < > 2.6* 2.9* 3.8  CL 93*  --   --   --  99  --  104  CO2 27  --   --   --  23  --  22  GLUCOSE 218*  --   --   --  151*  --  169*  BUN 45*  --   --   --  42*  --  46*  CREATININE 6.29*  --   --   --  6.34*  --  6.45*  CALCIUM 8.5*  --   --   --  8.4*  --  8.3*  PHOS  --   --   --   --   --   --  4.4   < > = values in this interval not displayed.   Liver Function Tests: Recent Labs  Lab 11/18/19 1734 11/20/19 0451  AST 17 13*  ALT 14 12  ALKPHOS 67 43  BILITOT 0.5 0.4  PROT 6.1* 5.3*  ALBUMIN 2.3* 1.8*   No results for input(s): LIPASE, AMYLASE in the last 168 hours. Recent Labs  Lab 11/18/19 1734  AMMONIA 22   CBC: Recent Labs  Lab 11/18/19 1734 11/18/19 2001 11/20/19 0451  WBC 15.8*  --  11.6*  NEUTROABS 12.6*  --  8.9*  HGB 8.4* 8.2* 7.6*  HCT 24.7* 24.0* 23.2*  MCV 91.8  --  92.8  PLT 159  --  165   Cardiac Enzymes: No results for input(s): CKTOTAL, CKMB, CKMBINDEX, TROPONINI in the last 168 hours. CBG: Recent Labs  Lab 11/18/19 2254 11/19/19 1452 11/20/19 1012 11/20/19 1302  GLUCAP 159* 157* 125* 134*    Studies/Results: DG Chest 2 View  Result Date: 11/18/2019 CLINICAL DATA:  Abdominal pain, weakness EXAM: CHEST - 2 VIEW COMPARISON:  02/09/2019 FINDINGS: Heart and mediastinal contours are within normal limits. No focal opacities or effusions. No acute bony abnormality. IMPRESSION: No active cardiopulmonary disease. Electronically Signed   By: Rolm Baptise M.D.   On: 11/18/2019 17:14   CT Head Wo  Contrast  Result Date: 11/18/2019 CLINICAL DATA:  Encephalopathy.  Altered mental status. EXAM: CT HEAD WITHOUT CONTRAST TECHNIQUE: Contiguous axial images were obtained from the base of the skull through the vertex without intravenous contrast. COMPARISON:  None. FINDINGS: Brain: No acute intracranial abnormality. Specifically, no hemorrhage, hydrocephalus, mass lesion, acute infarction, or significant intracranial injury. Vascular: No hyperdense vessel or unexpected calcification. Skull: No acute calvarial abnormality. Sinuses/Orbits: Visualized paranasal sinuses and mastoids clear. Orbital soft tissues unremarkable. Other: None IMPRESSION: No acute intracranial abnormality. Electronically Signed   By: Rolm Baptise M.D.   On: 11/18/2019 17:22   Medications: . sodium chloride Stopped (11/18/19 2357)  . sodium chloride     . aspirin EC  81 mg Oral Daily  . atorvastatin  10 mg Oral q1800  . calcitRIOL  0.25 mcg Oral Daily  . carvedilol  25 mg Oral BID WC  . Chlorhexidine Gluconate Cloth  6 each Topical Q0600  . feeding supplement (PRO-STAT SUGAR FREE 64)  30 mL Oral BID  . furosemide  80 mg Oral Daily  . gabapentin  100 mg Oral QHS  . heparin  5,000 Units Subcutaneous Q8H  . hydrALAZINE  100 mg Oral BID  . insulin aspart  0-15 Units Subcutaneous TID WC  . insulin aspart  0-5 Units Subcutaneous QHS  . isosorbide mononitrate  15 mg Oral Daily  . lisinopril  40 mg Oral Daily  . sevelamer carbonate  800 mg Oral TID WC  . sodium chloride flush  3 mL Intravenous Q12H  . sodium chloride flush  3 mL Intravenous Q12H  . spironolactone  25 mg Oral Daily

## 2019-11-20 NOTE — Progress Notes (Signed)
PROGRESS NOTE    Patricia [name]  WPY:[id number] DOB: 04/18/1956 DOA: 11/18/2019 PCP: Ladell Pier, MD   Brief Narrative:  HPI: Patricia [name] is a 64 y.o. female with medical history significant of end-stage renal disease, diabetes mellitus type 2, hypertension, chronic anemia, congestive heart failure, depression, recent history of diverticulitis on antibiotics, history of periumbilical tachypnea, was transferred to our hospital for change in mental status. Patient with end-stage renal disease on peritoneal dialysis at home apparently had hard time to access the dialysis catheter and dialysis Thursday night.  Since then she missed dialysis and per family member she is more confused has a strange behavior and increased anxiety. She is scheduled to have a catheter insertion Monday for transition to hemodialysis She was also scheduled to have a surgery for robotic sigmoidectomy and umbilical hernia repair which was post point because of the patient status   and move the appointment later on. Patient was recently in the hospital for diverticulitis and is still on antibiotic Augmentin and Flagyl  ED Course:  In the emergency room at [facility] Had a CT scan which was negative for acute pathology Potassium was 2.0, magnesium was normal BUN 45 creatinine 6.29 Blood sugar 218 White count 15.8 hemoglobin 8.4 sodium 132 Electrocardiogram normal sinus with LVH prolonged QT interval In the emergency room was given oxycodone Zestril 40 mg potassium chloride 40 mEq and then 10 mEq Emergency room physician discussed the case with nephrology DR Carolin Sicks who accepted the transfer and will see the patient in the morning  Assessment & Plan:   Principal Problem:   Acute encephalopathy Active Problems:   Dyslipidemia   Hypertension associated with diabetes (HCC)   Anxiety and depression   CKD (chronic kidney disease) stage 5, GFR less than 15 ml/min (HCC)   Anemia due to stage 5 chronic  kidney disease, not on chronic dialysis ([place])   Type II diabetes mellitus with renal manifestations (HCC)   Chronic combined systolic and diastolic CHF (congestive heart failure) (HCC)   Hypokalemia   Periumbilical hernia   ESRD on peritoneal dialysis ([place])   Sigmoid diverticulitis   Generalized weakness   Acute encephalopathy: Having evaluated yesterday and today where patient has stable vital signs and she is following all commands and she does not seem to have any focal neurological deficit however she would deliberately not answer any questions and breathing is also fine and she is very close to being mute makes me think that she perhaps has severe depression instead of acute encephalopathy which I am unable to determine unless she talks to me and answers my questions.  With this, I am going to consult psychiatry.  End-stage renal disease: She was on peritoneal dialysis but did not do her PD starting Thursday.  Nephrology consulted.  There was already a plan for her to switch to hemodialysis.  Will defer to nephrology for further plans.  Hypokalemia: Resolved.  Recent history of diverticulitis: Discontinued her antibiotics.  She was not supposed to continue this at home.  Essential hypertension: Blood pressure elevated however she has not received her due medications this morning.  She is on 4 different medications.  Chronic diastolic congestive heart failure: Euvolemic.  CAD: No symptoms.  Continue current medications.  Type 2 diabetes mellitus: Blood sugar control.  Start on SSI.  Last hemoglobin A1c 7.0.  Takes Lantus only 6 units at home.  Leukocytosis: Mild with no signs of infection and she is afebrile.  Blood culture negative.  Watch closely and repeat labs.  Acute on chronic anemia of chronic disease: Baseline hemoglobin between 8 and 9.  Now 7.6.  Repeat labs later today.  Transfuse if hemoglobin less than 7.  DVT prophylaxis: Heparin Code Status: Full code Family  Communication:  None present at bedside.  I discussed with the daughter over the phone yesterday. Patient is from: Home Disposition Plan: To be determined Barriers to discharge: Clinical improvement,   Estimated body mass index is 26.06 kg/m as calculated from the following:   Height as of this encounter: 5\' 4"  (1.626 m).   Weight as of this encounter: 68.9 kg.      Nutritional status:               Consultants:   Nephrology  Procedures:   None  Antimicrobials:   None   Subjective: Seen and examined.  Once again, she is mute and would deliberately not answer any question but she is opening eyes to the questions and she is also following all commands without any difficulty.  Avoiding eye contact.  Objective: Vitals:   11/20/19 0544 11/20/19 0830 11/20/19 1017 11/20/19 1020  BP: (!) 160/80 (!) 182/77 (!) 177/75   Pulse: 89 94 89   Resp: 15 16  16   Temp: 98.2 F (36.8 C) 98 F (36.7 C) 99.1 F (37.3 C)   TempSrc:  Oral Oral   SpO2: 98% 98% 97%   Weight:      Height:        Intake/Output Summary (Last 24 hours) at 11/20/2019 1051 Last data filed at 11/20/2019 0800 Gross per 24 hour  Intake 260 ml  Output 0 ml  Net 260 ml   Filed Weights   11/18/19 1556  Weight: 68.9 kg    Examination:  General exam: Appears calm and comfortable and mute Respiratory system: Clear to auscultation. Respiratory effort normal. Cardiovascular system: S1 & S2 heard, RRR. No JVD, murmurs, rubs, gallops or clicks. No pedal edema. Gastrointestinal system: Abdomen is nondistended, soft and nontender. No organomegaly or masses felt. Normal bowel sounds heard. Central nervous system: Alert but unable to assess orientation.  No focal deficit. Extremities: Symmetric 5 x 5 power. Skin: No rashes, lesions or ulcers Psychiatry: Seems severely depressed.   Data Reviewed: I have personally reviewed following labs and imaging studies  CBC: Recent Labs  Lab 11/18/19 1734  11/18/19 2001 11/20/19 0451  WBC 15.8*  --  11.6*  NEUTROABS 12.6*  --  8.9*  HGB 8.4* 8.2* 7.6*  HCT 24.7* 24.0* 23.2*  MCV 91.8  --  92.8  PLT 159  --  443   Basic Metabolic Panel: Recent Labs  Lab 11/18/19 1734 11/18/19 2001 11/19/19 0609 11/19/19 1208 11/20/19 0451  NA 132* 133* 135  --  137  K 2.0* 2.4* 2.6* 2.9* 3.8  CL 93*  --  99  --  104  CO2 27  --  23  --  22  GLUCOSE 218*  --  151*  --  169*  BUN 45*  --  42*  --  46*  CREATININE 6.29*  --  6.34*  --  6.45*  CALCIUM 8.5*  --  8.4*  --  8.3*  MG 1.9  --   --   --  2.6*  PHOS  --   --   --   --  4.4   GFR: Estimated Creatinine Clearance: 8.7 mL/min (A) (by C-G formula based on SCr of 6.45 mg/dL (H)). Liver Function  Tests: Recent Labs  Lab 11/18/19 1734 11/20/19 0451  AST 17 13*  ALT 14 12  ALKPHOS 67 43  BILITOT 0.5 0.4  PROT 6.1* 5.3*  ALBUMIN 2.3* 1.8*   No results for input(s): LIPASE, AMYLASE in the last 168 hours. Recent Labs  Lab 11/18/19 1734  AMMONIA 22   Coagulation Profile: Recent Labs  Lab 11/18/19 1734  INR 1.2   Cardiac Enzymes: No results for input(s): CKTOTAL, CKMB, CKMBINDEX, TROPONINI in the last 168 hours. BNP (last 3 results) No results for input(s): PROBNP in the last 8760 hours. HbA1C: No results for input(s): HGBA1C in the last 72 hours. CBG: Recent Labs  Lab 11/18/19 2254 11/19/19 1452 11/20/19 1012  GLUCAP 159* 157* 125*   Lipid Profile: No results for input(s): CHOL, HDL, LDLCALC, TRIG, CHOLHDL, LDLDIRECT in the last 72 hours. Thyroid Function Tests: Recent Labs    11/20/19 0451  TSH 0.402   Anemia Panel: No results for input(s): VITAMINB12, FOLATE, FERRITIN, TIBC, IRON, RETICCTPCT in the last 72 hours. Sepsis Labs: Recent Labs  Lab 11/18/19 1956  LATICACIDVEN 0.7    Recent Results (from the past 240 hour(s))  SARS CORONAVIRUS 2 (TAT 6-24 HRS) Nasopharyngeal Nasopharyngeal Swab     Status: None   Collection Time: 11/18/19  7:00 PM   Specimen:  Nasopharyngeal Swab  Result Value Ref Range Status   SARS Coronavirus 2 NEGATIVE NEGATIVE Final    Comment: (NOTE) SARS-CoV-2 target nucleic acids are NOT DETECTED. The SARS-CoV-2 RNA is generally detectable in upper and lower respiratory specimens during the acute phase of infection. Negative results do not preclude SARS-CoV-2 infection, do not rule out co-infections with other pathogens, and should not be used as the sole basis for treatment or other patient management decisions. Negative results must be combined with clinical observations, patient history, and epidemiological information. The expected result is Negative. Fact Sheet for Patients: SugarRoll.be Fact Sheet for Healthcare Providers: https://www.woods-mathews.com/ This test is not yet approved or cleared by the [place] FDA and  has been authorized for detection and/or diagnosis of SARS-CoV-2 by FDA under an Emergency Use Authorization (EUA). This EUA will remain  in effect (meaning this test can be used) for the duration of the COVID-19 declaration under Section 56 4(b)(1) of the Act, 21 U.S.C. section 360bbb-3(b)(1), unless the authorization is terminated or revoked sooner. Performed at [facility], [place] [address] [place], [place] [address]   Blood culture (routine x 2)     Status: None (Preliminary result)   Collection Time: 11/18/19  7:56 PM   Specimen: BLOOD RIGHT HAND  Result Value Ref Range Status   Specimen Description BLOOD RIGHT HAND  Final   Special Requests   Final    BOTTLES DRAWN AEROBIC AND ANAEROBIC Blood Culture results may not be optimal due to an inadequate volume of blood received in culture bottles Performed at [facility], [address] [place], [place] [address]    Culture NO GROWTH < 12 HOURS  Final   Report Status PENDING  Incomplete  Blood culture (routine x 2)     Status: None (Preliminary result)   Collection Time:  11/18/19  8:05 PM   Specimen: BLOOD LEFT HAND  Result Value Ref Range Status   Specimen Description BLOOD LEFT HAND  Final   Special Requests   Final    BOTTLES DRAWN AEROBIC AND ANAEROBIC Blood Culture adequate volume Performed at [facility], [address] [place],  [place] [address]    Culture NO GROWTH < 12 HOURS  Final   Report Status PENDING  Incomplete  SARS Coronavirus 2 by RT PCR (hospital order, performed in [facility] hospital lab) Nasopharyngeal Nasopharyngeal Swab     Status: None   Collection Time: 11/18/19  9:50 PM   Specimen: Nasopharyngeal Swab  Result Value Ref Range Status   SARS Coronavirus 2 NEGATIVE NEGATIVE Final    Comment: Performed at [facility], [address], [place], [place] [address]      Radiology Studies: DG Chest 2 View  Result Date: 11/18/2019 CLINICAL DATA:  Abdominal pain, weakness EXAM: CHEST - 2 VIEW COMPARISON:  02/09/2019 FINDINGS: Heart and mediastinal contours are within normal limits. No focal opacities or effusions. No acute bony abnormality. IMPRESSION: No active cardiopulmonary disease. Electronically Signed   By: Rolm Baptise M.D.   On: 11/18/2019 17:14   CT Head Wo Contrast  Result Date: 11/18/2019 CLINICAL DATA:  Encephalopathy.  Altered mental status. EXAM: CT HEAD WITHOUT CONTRAST TECHNIQUE: Contiguous axial images were obtained from the base of the skull through the vertex without intravenous contrast. COMPARISON:  None. FINDINGS: Brain: No acute intracranial abnormality. Specifically, no hemorrhage, hydrocephalus, mass lesion, acute infarction, or significant intracranial injury. Vascular: No hyperdense vessel or unexpected calcification. Skull: No acute calvarial abnormality. Sinuses/Orbits: Visualized paranasal sinuses and mastoids clear. Orbital soft tissues unremarkable. Other: None IMPRESSION: No acute intracranial abnormality. Electronically Signed   By: Rolm Baptise M.D.   On: 11/18/2019 17:22     Scheduled Meds: . aspirin EC  81 mg Oral Daily  . atorvastatin  10 mg Oral q1800  . calcitRIOL  0.25 mcg Oral Daily  . carvedilol  25 mg Oral BID WC  . Chlorhexidine Gluconate Cloth  6 each Topical Q0600  . feeding supplement (PRO-STAT SUGAR FREE 64)  30 mL Oral BID  . furosemide  80 mg Oral Daily  . gabapentin  100 mg Oral QHS  . heparin  5,000 Units Subcutaneous Q8H  . hydrALAZINE  100 mg Oral BID  . isosorbide mononitrate  15 mg Oral Daily  . lisinopril  40 mg Oral Daily  . sevelamer carbonate  800 mg Oral TID WC  . sodium chloride flush  3 mL Intravenous Q12H  . sodium chloride flush  3 mL Intravenous Q12H  . spironolactone  25 mg Oral Daily   Continuous Infusions: . sodium chloride Stopped (11/18/19 2357)  . sodium chloride       LOS: 1 day   Time spent: 35 minutes   Darliss Cheney, MD [facility]  11/20/2019, 10:51 AM   To contact the attending provider between 7A-7P or the covering provider during after hours 7P-7A, please log into the web site [email or web address].

## 2019-11-20 NOTE — Procedures (Signed)
Esrd, encephalopathy  S/p RT IJ TEMP HD CATH  Tip svcra No comp Stable ebl 0 Ready for use Full report in pacs

## 2019-11-20 NOTE — Consult Note (Signed)
[facility] Face-to-Face Psychiatry Consult   Reason for Consult:  Depression Referring Physician:  Dr Doristine Bosworth Patient Identification: Patricia [name] MRN:  [id number] Principal Diagnosis: Acute encephalopathy Diagnosis:  Principal Problem:   Acute encephalopathy Active Problems:   Anxiety and depression   Dyslipidemia   Hypertension associated with diabetes ([place])   CKD (chronic kidney disease) stage 5, GFR less than 15 ml/min (HCC)   Anemia due to stage 5 chronic kidney disease, not on chronic dialysis ([place])   Type II diabetes mellitus with renal manifestations (HCC)   Chronic combined systolic and diastolic CHF (congestive heart failure) (HCC)   Hypokalemia   Periumbilical hernia   ESRD on peritoneal dialysis ([place])   Sigmoid diverticulitis   Generalized weakness   Total Time spent with patient: 1 hour  Subjective:   Patricia [name] is a 64 y.o. female patient admitted with acute encephalopathy related to renal issues, consult for depression.    Patient seen and evaluated in person of this provider.  She was alert and looked at this provider during the attempted assessment but did not verbally respond, stared at this provider.  Evaluation unable to be completed, based on information in the chart recommend starting Celexa 10 mg for depression and anxiety with increase to 20 mg in three days.  HPI per MD:  Patricia [name] is a 64 y.o. female with medical history significant of end-stage renal disease, diabetes mellitus type 2, hypertension, chronic anemia, congestive heart failure, depression, recent history of diverticulitis on antibiotics, history of periumbilical tachypnea, was transferred to our hospital for change in mental status.  Patient with end-stage renal disease on peritoneal dialysis at home apparently had hard time to access the dialysis catheter and dialysis Thursday night.  Since then she missed dialysis and per family member she is more confused has a strange behavior and increased  anxiety.  She is scheduled to have a catheter insertion Monday for transition to hemodialysis She was also scheduled to have a surgery for robotic sigmoidectomy and umbilical hernia repair which was post point because of the patient status   and move the appointment later on. Patient was recently in the hospital for diverticulitis and is still on antibiotic Augmentin and Flagyl  Past Psychiatric History: anxiety  Risk to Self:  none Risk to Others:  none Prior Inpatient Therapy:  none Prior Outpatient Therapy:  none  Past Medical History:  Past Medical History:  Diagnosis Date  . Allergy   . Anemia   . Anxiety   . Blood transfusion without reported diagnosis   . Cardiomyopathy ([place]) 11/2016   no ischemic eval due to CKD  . CHF (congestive heart failure) ([place])   . CKD (chronic kidney disease), stage IV ([place])   . Depression   . Diabetes mellitus without complication ([place])    type 2  . Diverticulitis   . Dyspnea   . Hypertension   . Obesity     Past Surgical History:  Procedure Laterality Date  . ABDOMINAL HYSTERECTOMY    . BREAST SURGERY     reduction  . [place]  . LAPAROSCOPY N/A 06/16/2019   Procedure: primary closure of umbilical hernia;  Surgeon: Ralene Ok, MD;  Location: [place];  Service: General;  Laterality: N/A;  . LIPOMA EXCISION     back  Dr. Excell Seltzer 03-22-18  . LIPOMA EXCISION N/A 03/22/2018   Procedure: EXCISION OF BACK LIPOMA;  Surgeon: Excell Seltzer, MD;  Location: [facility];  Service: General;  Laterality: N/A;  . REDUCTION MAMMAPLASTY Bilateral    Family History:  Family History  Problem Relation Age of Onset  . Diabetes Brother   . Stomach cancer Brother   . Kidney disease Brother   . Heart Problems Maternal Grandmother        pacemaker  . Heart disease Maternal Grandfather   . Rectal cancer Neg Hx   . Esophageal cancer Neg Hx   . Liver cancer Neg Hx   . Colon cancer Neg Hx    Family Psychiatric  History: none Social  History:  Social History   Substance and Sexual Activity  Alcohol Use No     Social History   Substance and Sexual Activity  Drug Use No    Social History   Socioeconomic History  . Marital status: Divorced    Spouse name: Not on file  . Number of children: 2  . Years of education: Not on file  . Highest education level: Not on file  Occupational History  . Occupation: disabled  Tobacco Use  . Smoking status: Never Smoker  . Smokeless tobacco: Never Used  Substance and Sexual Activity  . Alcohol use: No  . Drug use: No  . Sexual activity: Not Currently  Other Topics Concern  . Not on file  Social History Narrative  . Not on file   Social Determinants of Health   Financial Resource Strain:   . Difficulty of Paying Living Expenses: Not on file  Food Insecurity:   . Worried About Charity fundraiser in the Last Year: Not on file  . Ran Out of Food in the Last Year: Not on file  Transportation Needs:   . Lack of Transportation (Medical): Not on file  . Lack of Transportation (Non-Medical): Not on file  Physical Activity:   . Days of Exercise per Week: Not on file  . Minutes of Exercise per Session: Not on file  Stress:   . Feeling of Stress : Not on file  Social Connections:   . Frequency of Communication with Friends and Family: Not on file  . Frequency of Social Gatherings with Friends and Family: Not on file  . Attends Religious Services: Not on file  . Active Member of Clubs or Organizations: Not on file  . Attends Archivist Meetings: Not on file  . Marital Status: Not on file   Additional Social History:    Allergies:   Allergies  Allergen Reactions  . Codone [Hydrocodone] Itching  . Norvasc [Amlodipine Besylate] Swelling    Lower extremity  . Hydralazine Swelling and Other (See Comments)    Leg swelling. Pt takes this as o/p  . Sulfa Antibiotics Itching and Rash    Labs:  Results for orders placed or performed during the hospital  encounter of 11/18/19 (from the past 48 hour(s))  Protime-INR     Status: None   Collection Time: 11/18/19  5:34 PM  Result Value Ref Range   Prothrombin Time 14.9 11.4 - 15.2 seconds   INR 1.2 0.8 - 1.2    Comment: (NOTE) INR goal varies based on device and disease states. Performed at [facility], [address] [place], [place] [address]   APTT     Status: Abnormal   Collection Time: 11/18/19  5:34 PM  Result Value Ref Range   aPTT 47 (H) 24 - 36 seconds    Comment:        IF BASELINE aPTT IS ELEVATED, SUGGEST  PATIENT RISK ASSESSMENT BE USED TO DETERMINE APPROPRIATE ANTICOAGULANT THERAPY. Performed at [facility], [address] [place], [place] [address]   CBC     Status: Abnormal   Collection Time: 11/18/19  5:34 PM  Result Value Ref Range   WBC 15.8 (H) 4.0 - 10.5 K/uL   RBC 2.69 (L) 3.87 - 5.11 MIL/uL   Hemoglobin 8.4 (L) 12.0 - 15.0 g/dL   HCT 24.7 (L) 36.0 - 46.0 %   MCV 91.8 80.0 - 100.0 fL   MCH 31.2 26.0 - 34.0 pg   MCHC 34.0 30.0 - 36.0 g/dL   RDW 13.9 11.5 - 15.5 %   Platelets 159 150 - 400 K/uL   nRBC 0.0 0.0 - 0.2 %    Comment: Performed at [facility], [address] [place], [place] [address]  Differential     Status: Abnormal   Collection Time: 11/18/19  5:34 PM  Result Value Ref Range   Neutrophils Relative % 80 %   Neutro Abs 12.6 (H) 1.7 - 7.7 K/uL   Lymphocytes Relative 13 %   Lymphs Abs 2.0 0.7 - 4.0 K/uL   Monocytes Relative 6 %   Monocytes Absolute 0.9 0.1 - 1.0 K/uL   Eosinophils Relative 0 %   Eosinophils Absolute 0.1 0.0 - 0.5 K/uL   Basophils Relative 0 %   Basophils Absolute 0.0 0.0 - 0.1 K/uL   Immature Granulocytes 1 %   Abs Immature Granulocytes 0.13 (H) 0.00 - 0.07 K/uL    Comment: Performed at [facility], [address] [place], [place] [address]  Comprehensive metabolic panel     Status: Abnormal   Collection Time: 11/18/19  5:34 PM  Result Value Ref Range   Sodium 132 (L)  135 - 145 mmol/L   Potassium 2.0 (LL) 3.5 - 5.1 mmol/L    Comment: CRITICAL RESULT CALLED TO, READ BACK BY AND VERIFIED WITH: REED C RN [id number] PHILLIPS C    Chloride 93 (L) 98 - 111 mmol/L   CO2 27 22 - 32 mmol/L   Glucose, Bld 218 (H) 70 - 99 mg/dL   BUN 45 (H) 8 - 23 mg/dL   Creatinine, Ser 6.29 (H) 0.44 - 1.00 mg/dL   Calcium 8.5 (L) 8.9 - 10.3 mg/dL   Total Protein 6.1 (L) 6.5 - 8.1 g/dL   Albumin 2.3 (L) 3.5 - 5.0 g/dL   AST 17 15 - 41 U/L   ALT 14 0 - 44 U/L   Alkaline Phosphatase 67 38 - 126 U/L   Total Bilirubin 0.5 0.3 - 1.2 mg/dL   GFR calc non Af Amer 7 (L) >60 mL/min   GFR calc Af Amer 8 (L) >60 mL/min   Anion gap 12 5 - 15    Comment: Performed at [facility], [address] [place], [place] [address]  Ammonia     Status: None   Collection Time: 11/18/19  5:34 PM  Result Value Ref Range   Ammonia 22 9 - 35 umol/L    Comment: Performed at [facility], [address] [place], [place] [address]  Magnesium     Status: None   Collection Time: 11/18/19  5:34 PM  Result Value Ref Range   Magnesium 1.9 1.7 - 2.4 mg/dL    Comment: Performed at [facility], [address] [place], [place] [address]  SARS CORONAVIRUS 2 (TAT 6-24  HRS) Nasopharyngeal Nasopharyngeal Swab     Status: None   Collection Time: 11/18/19  7:00 PM   Specimen: Nasopharyngeal Swab  Result Value Ref Range   SARS Coronavirus 2 NEGATIVE NEGATIVE    Comment: (NOTE) SARS-CoV-2 target nucleic acids are NOT DETECTED. The SARS-CoV-2 RNA is generally detectable in upper and lower respiratory specimens during the acute phase of infection. Negative results do not preclude SARS-CoV-2 infection, do not rule out co-infections with other pathogens, and should not be used as the sole basis for treatment or other patient management decisions. Negative results must be combined with clinical observations, patient history, and epidemiological information. The expected result  is Negative. Fact Sheet for Patients: SugarRoll.be Fact Sheet for Healthcare Providers: https://www.woods-mathews.com/ This test is not yet approved or cleared by the [place] FDA and  has been authorized for detection and/or diagnosis of SARS-CoV-2 by FDA under an Emergency Use Authorization (EUA). This EUA will remain  in effect (meaning this test can be used) for the duration of the COVID-19 declaration under Section 56 4(b)(1) of the Act, 21 U.S.C. section 360bbb-3(b)(1), unless the authorization is terminated or revoked sooner. Performed at [facility], [address], [place], [place] [address]   Lactic acid, plasma     Status: None   Collection Time: 11/18/19  7:56 PM  Result Value Ref Range   Lactic Acid, Venous 0.7 0.5 - 1.9 mmol/L    Comment: Performed at [facility], [address] [place], [place] [address]  Blood culture (routine x 2)     Status: None (Preliminary result)   Collection Time: 11/18/19  7:56 PM   Specimen: BLOOD RIGHT HAND  Result Value Ref Range   Specimen Description BLOOD RIGHT HAND    Special Requests      BOTTLES DRAWN AEROBIC AND ANAEROBIC Blood Culture results may not be optimal due to an inadequate volume of blood received in culture bottles Performed at [facility], [address] [place], [place] [address]    Culture NO GROWTH < 12 HOURS    Report Status PENDING   POCT I-Stat EG7     Status: Abnormal   Collection Time: 11/18/19  8:01 PM  Result Value Ref Range   pH, Ven 7.535 (H) 7.250 - 7.430   pCO2, Ven 32.6 (L) 44.0 - 60.0 mmHg   pO2, Ven 122.0 (H) 32.0 - 45.0 mmHg   Bicarbonate 27.4 20.0 - 28.0 mmol/L   TCO2 28 22 - 32 mmol/L   O2 Saturation 99.0 %   Acid-Base Excess 5.0 (H) 0.0 - 2.0 mmol/L   Sodium 133 (L) 135 - 145 mmol/L   Potassium 2.4 (LL) 3.5 - 5.1 mmol/L   Calcium, Ion 1.11 (L) 1.15 - 1.40 mmol/L   HCT 24.0 (L) 36.0 - 46.0 %   Hemoglobin 8.2 (L)  12.0 - 15.0 g/dL   Patient temperature 99.4 F    Collection site IV START    Drawn by Nurse    Sample type VENOUS    Comment NOTIFIED PHYSICIAN   Blood culture (routine x 2)     Status: None (Preliminary result)   Collection Time: 11/18/19  8:05 PM   Specimen: BLOOD LEFT HAND  Result Value Ref Range   Specimen Description BLOOD LEFT HAND    Special Requests      BOTTLES DRAWN AEROBIC AND ANAEROBIC Blood Culture adequate volume Performed at [facility], [address] [place], [place]  [address]    Culture NO GROWTH < 12 HOURS    Report Status PENDING   SARS Coronavirus 2 by RT PCR (hospital order, performed in [place] hospital lab) Nasopharyngeal Nasopharyngeal Swab     Status: None   Collection Time: 11/18/19  9:50 PM   Specimen: Nasopharyngeal Swab  Result Value Ref Range   SARS Coronavirus 2 NEGATIVE NEGATIVE    Comment: Performed at [facility], [address] [place], [place] [address]  CBG monitoring, ED     Status: Abnormal   Collection Time: 11/18/19 10:54 PM  Result Value Ref Range   Glucose-Capillary 159 (H) 70 - 99 mg/dL  Urinalysis, Routine w reflex microscopic     Status: Abnormal   Collection Time: 11/19/19 12:35 AM  Result Value Ref Range   Color, Urine YELLOW YELLOW   APPearance CLEAR CLEAR   Specific Gravity, Urine 1.015 1.005 - 1.030   pH 6.5 5.0 - 8.0   Glucose, UA >=500 (A) NEGATIVE mg/dL   Hgb urine dipstick NEGATIVE NEGATIVE   Bilirubin Urine NEGATIVE NEGATIVE   Ketones, ur NEGATIVE NEGATIVE mg/dL   Protein, ur >300 (A) NEGATIVE mg/dL   Nitrite NEGATIVE NEGATIVE   Leukocytes,Ua NEGATIVE NEGATIVE    Comment: Performed at [facility], [address] [place], [place] [address]  Urinalysis, Microscopic (reflex)     Status: Abnormal   Collection Time: 11/19/19 12:35 AM  Result Value Ref Range   RBC / HPF 0-5 0 - 5 RBC/hpf   WBC, UA 0-5 0 - 5 WBC/hpf   Bacteria, UA FEW (A) NONE SEEN   Squamous Epithelial / LPF  0-5 0 - 5   Hyaline Casts, UA PRESENT     Comment: Performed at [facility], [address] [place], [place] [address]  Basic metabolic panel     Status: Abnormal   Collection Time: 11/19/19  6:09 AM  Result Value Ref Range   Sodium 135 135 - 145 mmol/L   Potassium 2.6 (LL) 3.5 - 5.1 mmol/L    Comment: CRITICAL RESULT CALLED TO, READ BACK BY AND VERIFIED WITH: C.DAVIS RN @ 0720 11/19/2019 BY C.EDENS    Chloride 99 98 - 111 mmol/L   CO2 23 22 - 32 mmol/L   Glucose, Bld 151 (H) 70 - 99 mg/dL   BUN 42 (H) 8 - 23 mg/dL   Creatinine, Ser 6.34 (H) 0.44 - 1.00 mg/dL   Calcium 8.4 (L) 8.9 - 10.3 mg/dL   GFR calc non Af Amer 7 (L) >60 mL/min   GFR calc Af Amer 8 (L) >60 mL/min   Anion gap 13 5 - 15    Comment: Performed at [facility], [address] [place], [place] [address]  Potassium     Status: Abnormal   Collection Time: 11/19/19 12:08 PM  Result Value Ref Range   Potassium 2.9 (L) 3.5 - 5.1 mmol/L    Comment: Performed at [facility] [address] [place], [place] [address]  Glucose, capillary     Status: Abnormal   Collection Time: 11/19/19  2:52 PM  Result Value Ref Range   Glucose-Capillary 157 (H) 70 - 99 mg/dL  Comprehensive metabolic panel     Status: Abnormal   Collection Time: 11/20/19  4:51 AM  Result Value Ref Range   Sodium 137 135 - 145 mmol/L   Potassium 3.8 3.5 - 5.1 mmol/L   Chloride 104 98 - 111  mmol/L   CO2 22 22 - 32 mmol/L   Glucose, Bld 169 (H) 70 - 99 mg/dL   BUN 46 (H) 8 - 23 mg/dL   Creatinine, Ser 6.45 (H) 0.44 - 1.00 mg/dL   Calcium 8.3 (L) 8.9 - 10.3 mg/dL   Total Protein 5.3 (L) 6.5 - 8.1 g/dL   Albumin 1.8 (L) 3.5 - 5.0 g/dL   AST 13 (L) 15 - 41 U/L   ALT 12 0 - 44 U/L   Alkaline Phosphatase 43 38 - 126 U/L   Total Bilirubin 0.4 0.3 - 1.2 mg/dL   GFR calc non Af Amer 6 (L) >60 mL/min   GFR calc Af Amer 7 (L) >60 mL/min   Anion gap 11 5 - 15    Comment: Performed at [facility], [address],  [place], [place] [address]  Magnesium     Status: Abnormal   Collection Time: 11/20/19  4:51 AM  Result Value Ref Range   Magnesium 2.6 (H) 1.7 - 2.4 mg/dL    Comment: Performed at [facility] [address], [place], [place] [address]  Phosphorus     Status: None   Collection Time: 11/20/19  4:51 AM  Result Value Ref Range   Phosphorus 4.4 2.5 - 4.6 mg/dL    Comment: Performed at [facility] [address], [place], [place] [address]  CBC WITH DIFFERENTIAL     Status: Abnormal   Collection Time: 11/20/19  4:51 AM  Result Value Ref Range   WBC 11.6 (H) 4.0 - 10.5 K/uL   RBC 2.50 (L) 3.87 - 5.11 MIL/uL   Hemoglobin 7.6 (L) 12.0 - 15.0 g/dL   HCT 23.2 (L) 36.0 - 46.0 %   MCV 92.8 80.0 - 100.0 fL   MCH 30.4 26.0 - 34.0 pg   MCHC 32.8 30.0 - 36.0 g/dL   RDW 14.0 11.5 - 15.5 %   Platelets 165 150 - 400 K/uL   nRBC 0.0 0.0 - 0.2 %   Neutrophils Relative % 77 %   Neutro Abs 8.9 (H) 1.7 - 7.7 K/uL   Lymphocytes Relative 16 %   Lymphs Abs 1.8 0.7 - 4.0 K/uL   Monocytes Relative 6 %   Monocytes Absolute 0.6 0.1 - 1.0 K/uL   Eosinophils Relative 0 %   Eosinophils Absolute 0.1 0.0 - 0.5 K/uL   Basophils Relative 0 %   Basophils Absolute 0.0 0.0 - 0.1 K/uL   Immature Granulocytes 1 %   Abs Immature Granulocytes 0.09 (H) 0.00 - 0.07 K/uL    Comment: Performed at [facility], [address], [place], [place] [address]  TSH     Status: None   Collection Time: 11/20/19  4:51 AM  Result Value Ref Range   TSH 0.402 0.350 - 4.500 uIU/mL    Comment: Performed by a 3rd Generation assay with a functional sensitivity of <=0.01 uIU/mL. Performed at [facility], [address] [place], [place] [address]   Glucose, capillary     Status: Abnormal   Collection Time: 11/20/19 10:12 AM  Result Value Ref Range   Glucose-Capillary 125 (H) 70 - 99 mg/dL    Current Facility-Administered Medications  Medication Dose Route Frequency Provider Last Rate Last Admin  . 0.9 %   sodium chloride infusion   Intravenous PRN Malvin Johns, MD   Stopped at 11/18/19 2357  . 0.9 %  sodium chloride infusion  250 mL Intravenous PRN  Cristescu, Linard Millers, MD      . acetaminophen (TYLENOL) tablet 650 mg  650 mg Oral Q6H PRN Cristescu, Mircea G, MD      . albuterol (PROVENTIL) (2.5 MG/3ML) 0.083% nebulizer solution 2.5 mg  2.5 mg Inhalation Q6H PRN Cristescu, Mircea G, MD      . aspirin EC tablet 81 mg  81 mg Oral Daily Cristescu, Mircea G, MD   81 mg at 11/19/19 0948  . atorvastatin (LIPITOR) tablet 10 mg  10 mg Oral q1800 Cristescu, Mircea G, MD   10 mg at 11/19/19 1641  . calcitRIOL (ROCALTROL) capsule 0.25 mcg  0.25 mcg Oral Daily Cristescu, Mircea G, MD   0.25 mcg at 11/19/19 0957  . carvedilol (COREG) tablet 25 mg  25 mg Oral BID WC Cristescu, Linard Millers, MD   25 mg at 11/19/19 1641  . Chlorhexidine Gluconate Cloth 2 % PADS 6 each  6 each Topical Q0600 [facility], [place], [place]   6 each at 11/20/19 [phone]  . feeding supplement (PRO-STAT SUGAR FREE 64) liquid 30 mL  30 mL Oral BID Cristescu, Mircea G, MD   30 mL at 11/19/19 0953  . furosemide (LASIX) tablet 80 mg  80 mg Oral Daily Cristescu, Linard Millers, MD   80 mg at 11/19/19 0948  . gabapentin (NEURONTIN) capsule 100 mg  100 mg Oral QHS Cristescu, Mircea G, MD   100 mg at 11/19/19 2300  . heparin injection 5,000 Units  5,000 Units Subcutaneous Q8H Cristescu, Mircea G, MD   5,000 Units at 11/20/19 2025  . hydrALAZINE (APRESOLINE) injection 10 mg  10 mg Intravenous Q4H PRN Cristescu, Mircea G, MD   10 mg at 11/19/19 1833  . hydrALAZINE (APRESOLINE) tablet 100 mg  100 mg Oral BID Toy Baker, MD   100 mg at 11/19/19 2300  . isosorbide mononitrate (IMDUR) 24 hr tablet 15 mg  15 mg Oral Daily Cristescu, Linard Millers, MD   15 mg at 11/19/19 0948  . labetalol (NORMODYNE) injection 10 mg  10 mg Intravenous Q4H PRN Pahwani, Einar Grad, MD      . lisinopril (ZESTRIL) tablet 40 mg  40 mg Oral Daily Cristescu, Linard Millers, MD   40 mg at 11/19/19 0948  .  ondansetron (ZOFRAN) tablet 4 mg  4 mg Oral Q6H PRN Cristescu, Mircea G, MD       Or  . ondansetron (ZOFRAN) injection 4 mg  4 mg Intravenous Q6H PRN Cristescu, Mircea G, MD      . oxyCODONE (Oxy IR/ROXICODONE) immediate release tablet 5 mg  5 mg Oral Q4H PRN Carlisle Cater, PA-C   5 mg at 11/18/19 2345  . sevelamer carbonate (RENVELA) tablet 800 mg  800 mg Oral TID WC Cristescu, Mircea G, MD   800 mg at 11/19/19 1641  . sodium chloride flush (NS) 0.9 % injection 3 mL  3 mL Intravenous Q12H Cristescu, Mircea G, MD   3 mL at 11/19/19 2300  . sodium chloride flush (NS) 0.9 % injection 3 mL  3 mL Intravenous Q12H Cristescu, Mircea G, MD   3 mL at 11/20/19 1004  . sodium chloride flush (NS) 0.9 % injection 3 mL  3 mL Intravenous PRN Cristescu, Mircea G, MD      . spironolactone (ALDACTONE) tablet 25 mg  25 mg Oral Daily Cristescu, Linard Millers, MD   25 mg at 11/19/19 4270    Musculoskeletal: Strength & Muscle Tone: decreased Gait & Station: did not witness Patient leans: N/A  Psychiatric  Specialty Exam: Physical Exam  Nursing note and vitals reviewed. Constitutional: She appears well-developed and well-nourished.  HENT:  Head: Normocephalic.  Respiratory: Effort normal.  Neurological: She is alert.  Psychiatric: Her affect is blunt. She is slowed. Cognition and memory are impaired. She expresses inappropriate judgment. She exhibits a depressed mood. She is noncommunicative.    Review of Systems  Psychiatric/Behavioral: Positive for dysphoric mood.  All other systems reviewed and are negative.   Blood pressure (!) 177/75, pulse 89, temperature 99.1 F (37.3 C), temperature source Oral, resp. rate 16, height 5\' 4"  (1.626 m), weight 68.9 kg, SpO2 97 %.Body mass index is 26.06 kg/m.  General Appearance: Casual  Eye Contact:  Good  Speech:  NA  Volume:  patient did not speak  Mood:  Depressed  Affect:  Blunt  Thought Process:  NA  Orientation:  NA  Thought Content:  NA  Suicidal  Thoughts:  unable to assess  Homicidal Thoughts:  unable to assess  Memory:  NA  Judgement:  Impaired  Insight:  NA  Psychomotor Activity:  Decreased  Concentration:  Concentration: Poor and Attention Span: Poor  Recall:  NA  Fund of Knowledge:  NA  Language:  NA  Akathisia:  No  Handed:  Right  AIMS (if indicated):     Assets:  Leisure Time Resilience Social Support  ADL's:  Impaired  Cognition:  Impaired,  Severe  Sleep:      64 year old female admitted for renal issues and the need for hemodialysis, consult placed for depression.  Patient is currently experiencing acute metabolic encephalopathy and able to participate in the assessment as she is nonverbal.  History of anxiety per chart and treatment recommendations below.  Treatment Plan Summary: Major depressive disorder, recurrent, severe without psychosis and anxiety: -Recommend Celexa 10 mg daily with increase to 20 mg in 3 days  Disposition: No evidence of imminent risk to self or others at present.    Waylan Boga, NP 11/20/2019 10:36 AM

## 2019-11-20 NOTE — Progress Notes (Signed)
Patient is very sleepy this morning. Would not answer my questions . Vitals are in normal range. MD was notify, orders are put in for Psych consult. Will continue to monitor patient.

## 2019-11-21 DIAGNOSIS — Z992 Dependence on renal dialysis: Secondary | ICD-10-CM | POA: Diagnosis not present

## 2019-11-21 DIAGNOSIS — N2581 Secondary hyperparathyroidism of renal origin: Secondary | ICD-10-CM | POA: Diagnosis not present

## 2019-11-21 DIAGNOSIS — N186 End stage renal disease: Secondary | ICD-10-CM | POA: Diagnosis not present

## 2019-11-21 LAB — CBC WITH DIFFERENTIAL/PLATELET
Abs Immature Granulocytes: 0.11 10*3/uL — ABNORMAL HIGH (ref 0.00–0.07)
Basophils Absolute: 0.1 10*3/uL (ref 0.0–0.1)
Basophils Relative: 1 %
Eosinophils Absolute: 0.2 10*3/uL (ref 0.0–0.5)
Eosinophils Relative: 2 %
HCT: 24 % — ABNORMAL LOW (ref 36.0–46.0)
Hemoglobin: 7.8 g/dL — ABNORMAL LOW (ref 12.0–15.0)
Immature Granulocytes: 1 %
Lymphocytes Relative: 26 %
Lymphs Abs: 2.7 10*3/uL (ref 0.7–4.0)
MCH: 30.6 pg (ref 26.0–34.0)
MCHC: 32.5 g/dL (ref 30.0–36.0)
MCV: 94.1 fL (ref 80.0–100.0)
Monocytes Absolute: 0.7 10*3/uL (ref 0.1–1.0)
Monocytes Relative: 7 %
Neutro Abs: 6.6 10*3/uL (ref 1.7–7.7)
Neutrophils Relative %: 63 %
Platelets: 194 10*3/uL (ref 150–400)
RBC: 2.55 MIL/uL — ABNORMAL LOW (ref 3.87–5.11)
RDW: 14.4 % (ref 11.5–15.5)
WBC: 10.4 10*3/uL (ref 4.0–10.5)
nRBC: 0 % (ref 0.0–0.2)

## 2019-11-21 LAB — COMPREHENSIVE METABOLIC PANEL
ALT: 12 U/L (ref 0–44)
AST: 14 U/L — ABNORMAL LOW (ref 15–41)
Albumin: 1.9 g/dL — ABNORMAL LOW (ref 3.5–5.0)
Alkaline Phosphatase: 44 U/L (ref 38–126)
Anion gap: 14 (ref 5–15)
BUN: 52 mg/dL — ABNORMAL HIGH (ref 8–23)
CO2: 23 mmol/L (ref 22–32)
Calcium: 8.5 mg/dL — ABNORMAL LOW (ref 8.9–10.3)
Chloride: 103 mmol/L (ref 98–111)
Creatinine, Ser: 7.33 mg/dL — ABNORMAL HIGH (ref 0.44–1.00)
GFR calc Af Amer: 6 mL/min — ABNORMAL LOW (ref >60)
GFR calc non Af Amer: 5 mL/min — ABNORMAL LOW (ref >60)
Glucose, Bld: 91 mg/dL (ref 70–99)
Potassium: 3.8 mmol/L (ref 3.5–5.1)
Sodium: 140 mmol/L (ref 135–145)
Total Bilirubin: 0.6 mg/dL (ref 0.3–1.2)
Total Protein: 5.6 g/dL — ABNORMAL LOW (ref 6.5–8.1)

## 2019-11-21 LAB — HEMOGLOBIN A1C
Hgb A1c MFr Bld: 7.4 % — ABNORMAL HIGH (ref 4.8–5.6)
Mean Plasma Glucose: 166 mg/dL

## 2019-11-21 LAB — HEPATITIS B CORE ANTIBODY, TOTAL: Hep B Core Total Ab: NONREACTIVE

## 2019-11-21 LAB — GLUCOSE, CAPILLARY
Glucose-Capillary: 89 mg/dL (ref 70–99)
Glucose-Capillary: 108 mg/dL — ABNORMAL HIGH (ref 70–99)
Glucose-Capillary: 149 mg/dL — ABNORMAL HIGH (ref 70–99)

## 2019-11-21 LAB — MAGNESIUM: Magnesium: 2.7 mg/dL — ABNORMAL HIGH (ref 1.7–2.4)

## 2019-11-21 LAB — PHOSPHORUS: Phosphorus: 4.5 mg/dL (ref 2.5–4.6)

## 2019-11-21 LAB — HEPATITIS B SURFACE ANTIGEN: Hepatitis B Surface Ag: NONREACTIVE

## 2019-11-21 LAB — HEPATITIS B SURFACE ANTIBODY,QUALITATIVE: Hep B S Ab: NONREACTIVE

## 2019-11-21 MED ORDER — SODIUM CHLORIDE 0.9 % IV SOLN: 100.0000 mL | INTRAVENOUS | Status: DC | PRN

## 2019-11-21 MED ORDER — LORAZEPAM 2 MG/ML IJ SOLN
0.5000 mg | INTRAMUSCULAR | Status: AC | PRN
  Administered 2019-11-21 – 2019-11-22 (×2): 0.5 mg via INTRAVENOUS
  Filled 2019-11-21 (×2): qty 1

## 2019-11-21 MED ORDER — HYDRALAZINE HCL 50 MG PO TABS
100.0000 mg | ORAL_TABLET | Freq: Three times a day (TID) | ORAL | Status: DC
  Administered 2019-11-21 – 2019-11-25 (×8): 100 mg via ORAL
  Filled 2019-11-21 (×11): qty 2

## 2019-11-21 MED ORDER — CITALOPRAM HYDROBROMIDE 20 MG PO TABS
20.0000 mg | ORAL_TABLET | Freq: Every day | ORAL | Status: DC
  Administered 2019-11-24 – 2019-11-25 (×2): 20 mg via ORAL
  Filled 2019-11-21 (×3): qty 1

## 2019-11-21 MED ORDER — DARBEPOETIN ALFA 40 MCG/0.4ML IJ SOSY
40.0000 ug | PREFILLED_SYRINGE | INTRAMUSCULAR | Status: DC
  Administered 2019-11-22: 40 ug via INTRAVENOUS

## 2019-11-21 MED ORDER — HEPARIN SODIUM (PORCINE) 1000 UNIT/ML DIALYSIS: 1000.0000 [IU] | INTRAMUSCULAR | Status: DC | PRN

## 2019-11-21 MED ORDER — ALTEPLASE 2 MG IJ SOLR: 2.0000 mg | Freq: Once | INTRAMUSCULAR | Status: DC | PRN

## 2019-11-21 MED ORDER — LIDOCAINE-PRILOCAINE 2.5-2.5 % EX CREA: 1.0000 "application " | TOPICAL_CREAM | CUTANEOUS | Status: DC | PRN

## 2019-11-21 MED ORDER — CITALOPRAM HYDROBROMIDE 20 MG PO TABS
10.0000 mg | ORAL_TABLET | Freq: Every day | ORAL | Status: AC
  Administered 2019-11-21 – 2019-11-23 (×2): 10 mg via ORAL
  Filled 2019-11-21 (×3): qty 1

## 2019-11-21 MED ORDER — RENA-VITE PO TABS
1.0000 | ORAL_TABLET | Freq: Every day | ORAL | Status: DC
  Administered 2019-11-21 – 2019-11-24 (×4): 1 via ORAL
  Filled 2019-11-21 (×4): qty 1

## 2019-11-21 MED ORDER — HEPARIN SODIUM (PORCINE) 1000 UNIT/ML IJ SOLN
INTRAMUSCULAR | Status: AC
  Administered 2019-11-21: 1000 [IU] via INTRAVENOUS_CENTRAL
  Filled 2019-11-21: qty 3

## 2019-11-21 MED ORDER — LIDOCAINE HCL (PF) 1 % IJ SOLN: 5.0000 mL | INTRAMUSCULAR | Status: DC | PRN

## 2019-11-21 MED ORDER — PENTAFLUOROPROP-TETRAFLUOROETH EX AERO: 1.0000 "application " | INHALATION_SPRAY | CUTANEOUS | Status: DC | PRN

## 2019-11-21 NOTE — Progress Notes (Addendum)
Subjective:  Seen on HD  With R IJ Tem  Cath , no voiced response ,or opening eyes to request  But will fallow commands for extrem movements   Objective Vital signs in last 24 hours: Vitals:   11/21/19 0900 11/21/19 0930 11/21/19 1000 11/21/19 1030  BP: (!) 187/94 (!) 184/91 (!) 170/85 (!) 187/98  Pulse: 98 99 99 99  Resp:    16  Temp:    98.3 F (36.8 C)  TempSrc:    Oral  SpO2:    98%  Weight:    65.2 kg  Height:       Weight change:   Physical Exam: General: ON HD  NAD, chronically ill appearing female , no eye contact or voiced response to questions  Lungs: CTA bilaterally. No wheeze, rales or rhonchi. Breathing is unlabored. Heart:RRR. No murmur, rubs or gallops.  Abdomen: soft, nontender, +BS, no guarding, no rebound tenderness, Pd cath present Right lower abd  Dressing clean / dry non tender  Lower extremities:no edema Neuro:  Appearing mute ,not answering questions or opening eyes to request  will move lower extrem to request and squeeze my hands to request  Dialysis Access: R IJ TEMP Cath Patent on HD /PD cath in RLQ   OP Dialysis Orders: CCPD 7xwk, EDW 68.5kg 5 exchanges Fill Vol 2.4L, Dwell time 1.5hr No last fill/day exchange  Problem/Plan:: 1. AMS - CT head negative. UA not indicative of infection.BC No growth 3 days / Possibly related to uremia but BUN/SCr is close to baseline. admit consulting Psy. / Celexa .  2. Hypokalemia - K 2.0 > #. 8 > 3.8 on admit.to  3.8  after / K supplemented. / using 4k bath on hd  3. ESRD- PD > transitioning to HD this week  Will need tunneled perm cath (no current indication infection ) > will re-consult IR for PERM cath today or AM . Last PD last  Thursday. / Clip to OP center [facility] / but noted plan for Transfer to [facility] If stable  Thursday for  Abdominal Surgery with Dr Raul Del concerning her PD cath  4. Hypertension/volume- Blood pressure elevated, continue home meds. UF 1 liter today . Plan for minimal  UF with initial dialysis.  5. Anemiaof CKD- Hgb 8.4.admit > 7.4 > 7.8 last mircera given on 12/15. Order aranesp 149mcg with HD. 6. Secondary Hyperparathyroidism -Ca at goal. Phos 4.5  Renvela binders not on  VDRA. 7. Nutrition-.renal diet  renal vit   Ernest Haber, PA-C [facility] [phone] 11/21/2019,12:08 PM  LOS: 2 days   Pt seen, examined and agree w A/P as above.  Kelly Splinter  MD 11/21/2019, 4:52 PM    Labs: Basic Metabolic Panel: Recent Labs  Lab 11/19/19 0609 11/19/19 0609 11/19/19 1208 11/20/19 0451 11/21/19 0410  NA 135  --   --  137 140  K 2.6*   < > 2.9* 3.8 3.8  CL 99  --   --  104 103  CO2 23  --   --  22 23  GLUCOSE 151*  --   --  169* 91  BUN 42*  --   --  46* 52*  CREATININE 6.34*  --   --  6.45* 7.33*  CALCIUM 8.4*  --   --  8.3* 8.5*  PHOS  --   --   --  4.4 4.5   < > = values in this interval not displayed.   Liver Function Tests:  Recent Labs  Lab 11/18/19 1734 11/20/19 0451 11/21/19 0410  AST 17 13* 14*  ALT 14 12 12   ALKPHOS 67 43 44  BILITOT 0.5 0.4 0.6  PROT 6.1* 5.3* 5.6*  ALBUMIN 2.3* 1.8* 1.9*   No results for input(s): LIPASE, AMYLASE in the last 168 hours. Recent Labs  Lab 11/18/19 1734  AMMONIA 22   CBC: Recent Labs  Lab 11/18/19 1734 11/18/19 1734 11/18/19 2001 11/20/19 0451 11/21/19 0410  WBC 15.8*  --   --  11.6* 10.4  NEUTROABS 12.6*  --   --  8.9* 6.6  HGB 8.4*   < > 8.2* 7.6* 7.8*  HCT 24.7*   < > 24.0* 23.2* 24.0*  MCV 91.8  --   --  92.8 94.1  PLT 159  --   --  165 194   < > = values in this interval not displayed.   Cardiac Enzymes: No results for input(s): CKTOTAL, CKMB, CKMBINDEX, TROPONINI in the last 168 hours. CBG: Recent Labs  Lab 11/20/19 1012 11/20/19 1302 11/20/19 1723 11/20/19 2129 11/21/19 1143  GLUCAP 125* 134* 116* 117* 89    Studies/Results: IR Fluoro Guide CV Line Right  Result Date: 11/20/2019 INDICATION: ENCEPHALOPATHY, END-STAGE RENAL DISEASE, NO  CURRENT ACCESS FOR DIALYSIS EXAM: ULTRASOUND GUIDANCE FOR VASCULAR ACCESS RIGHT IJ TEMPORARY DIALYSIS CATHETER (16 CM MAHURKAR CATHETER) MEDICATIONS: 1% lidocaine local ANESTHESIA/SEDATION: Moderate Sedation Time: None. The patient's level of consciousness and vital signs were monitored continuously by radiology nursing throughout the procedure under my direct supervision. FLUOROSCOPY TIME:  Fluoroscopy Time: 0 minutes 24 seconds ( mGy). COMPLICATIONS: None immediate. PROCEDURE: Informed written consent was obtained from the patient's family after a thorough discussion of the procedural risks, benefits and alternatives. All questions were addressed. Maximal Sterile Barrier Technique was utilized including caps, mask, sterile gowns, sterile gloves, sterile drape, hand hygiene and skin antiseptic. A timeout was performed prior to the initiation of the procedure. Under sterile conditions and local anesthesia, ultrasound micropuncture access performed of the right internal jugular vein. Images obtained for documentation of the patent right internal jugular vein. Guidewire advanced easily under fluoroscopy followed by the micro dilator set. Amplatz guidewire inserted followed by tract dilatation to insert a 16 cm Mahurkar temporary dialysis catheter. Tip SVC RA junction. Position confirmed with fluoroscopy. Images obtained for documentation. Blood aspirated easily followed by saline and heparin flushes. Appropriate volume and strength of heparin instilled in all 3 lumens followed by external caps. Catheter secured Prolene sutures and a sterile dressing. No immediate complication. Patient tolerated the procedure well. IMPRESSION: Successful ultrasound and fluoroscopic right IJ temporary dialysis catheter (16 cm Mahurkar catheter). Tip SVC RA junction. Ready for use. Electronically Signed   By: Jerilynn Mages.  Shick M.D.   On: 11/20/2019 15:33   IR US Guide Vasc Access Right  Result Date: 11/20/2019 INDICATION: ENCEPHALOPATHY,  END-STAGE RENAL DISEASE, NO CURRENT ACCESS FOR DIALYSIS EXAM: ULTRASOUND GUIDANCE FOR VASCULAR ACCESS RIGHT IJ TEMPORARY DIALYSIS CATHETER (16 CM MAHURKAR CATHETER) MEDICATIONS: 1% lidocaine local ANESTHESIA/SEDATION: Moderate Sedation Time: None. The patient's level of consciousness and vital signs were monitored continuously by radiology nursing throughout the procedure under my direct supervision. FLUOROSCOPY TIME:  Fluoroscopy Time: 0 minutes 24 seconds ( mGy). COMPLICATIONS: None immediate. PROCEDURE: Informed written consent was obtained from the patient's family after a thorough discussion of the procedural risks, benefits and alternatives. All questions were addressed. Maximal Sterile Barrier Technique was utilized including caps, mask, sterile gowns, sterile gloves, sterile drape, hand hygiene and skin  antiseptic. A timeout was performed prior to the initiation of the procedure. Under sterile conditions and local anesthesia, ultrasound micropuncture access performed of the right internal jugular vein. Images obtained for documentation of the patent right internal jugular vein. Guidewire advanced easily under fluoroscopy followed by the micro dilator set. Amplatz guidewire inserted followed by tract dilatation to insert a 16 cm Mahurkar temporary dialysis catheter. Tip SVC RA junction. Position confirmed with fluoroscopy. Images obtained for documentation. Blood aspirated easily followed by saline and heparin flushes. Appropriate volume and strength of heparin instilled in all 3 lumens followed by external caps. Catheter secured Prolene sutures and a sterile dressing. No immediate complication. Patient tolerated the procedure well. IMPRESSION: Successful ultrasound and fluoroscopic right IJ temporary dialysis catheter (16 cm Mahurkar catheter). Tip SVC RA junction. Ready for use. Electronically Signed   By: Jerilynn Mages.  Shick M.D.   On: 11/20/2019 15:33   Medications: . sodium chloride Stopped (11/18/19 2357)  .  sodium chloride     . aspirin EC  81 mg Oral Daily  . atorvastatin  10 mg Oral q1800  . calcitRIOL  0.25 mcg Oral Daily  . carvedilol  25 mg Oral BID WC  . Chlorhexidine Gluconate Cloth  6 each Topical Q0600  . citalopram  10 mg Oral Daily   And  . [START ON 11/24/2019] citalopram  20 mg Oral Daily  . feeding supplement (PRO-STAT SUGAR FREE 64)  30 mL Oral BID  . furosemide  80 mg Oral Daily  . gabapentin  100 mg Oral QHS  . heparin  5,000 Units Subcutaneous Q8H  . hydrALAZINE  100 mg Oral Q8H  . insulin aspart  0-15 Units Subcutaneous TID WC  . insulin aspart  0-5 Units Subcutaneous QHS  . isosorbide mononitrate  15 mg Oral Daily  . lisinopril  40 mg Oral Daily  . sevelamer carbonate  800 mg Oral TID WC  . sodium chloride flush  3 mL Intravenous Q12H  . sodium chloride flush  3 mL Intravenous Q12H  . spironolactone  25 mg Oral Daily

## 2019-11-21 NOTE — Evaluation (Signed)
Physical Therapy Evaluation Patient Details Name: Patricia [name] MRN: [id number] DOB: 1956/04/07 Today's Date: 11/21/2019   History of Present Illness  Pt is a 64 y/o female admitted secondary to altered mental status. Differential diagnosis includes MDD and acute encephalopathy. Pt is s/p R IJ cath. PMH include HTN, DM, CHF, and ESRD on PD.   Clinical Impression  Pt admitted secondary to problem above with deficits below. Pt confused throughout session and easily distracted. When PT introduced self, pt stating "[place]..the one that's married to [place]?" and kept repeating throughout session. Pt requiring max A to maintain standing balance. Feel pt will require increased assist at d/c. Will continue to follow acutely to maximize functional mobility independence and safety.     Follow Up Recommendations SNF;Supervision/Assistance - 24 hour    Equipment Recommendations  Other (comment)(TBD)    Recommendations for Other Services       Precautions / Restrictions Precautions Precautions: Fall Restrictions Weight Bearing Restrictions: No      Mobility  Bed Mobility Overal bed mobility: Needs Assistance Bed Mobility: Supine to Sit;Sit to Supine     Supine to sit: Mod assist Sit to supine: Mod assist   General bed mobility comments: Mod A for assist with trunk and LEs. Increased time required and multimodal cues required for sequencing.   Transfers Overall transfer level: Needs assistance Equipment used: 1 person hand held assist Transfers: Sit to/from Stand Sit to Stand: Max assist         General transfer comment: Max A for lift assist and steadying to stand. Performed X 2. Pt unable to sequence to take side steps at EOB.   Ambulation/Gait                Stairs            Wheelchair Mobility    Modified Rankin (Stroke Patients Only)       Balance Overall balance assessment: Needs assistance Sitting-balance support: No upper extremity supported;Feet  supported Sitting balance-Leahy Scale: Fair     Standing balance support: Bilateral upper extremity supported;During functional activity Standing balance-Leahy Scale: Poor Standing balance comment: Reliant on external support.                              Pertinent Vitals/Pain Pain Assessment: Faces Faces Pain Scale: No hurt    Home Living Family/patient expects to be discharged to:: Unsure                 Additional Comments: Pt unable to provide home information secondary to confusion.     Prior Function           Comments: Unsure of baseline as pt unable to provide home information secondary to confusion.      Hand Dominance        Extremity/Trunk Assessment   Upper Extremity Assessment Upper Extremity Assessment: Generalized weakness    Lower Extremity Assessment Lower Extremity Assessment: Generalized weakness    Cervical / Trunk Assessment Cervical / Trunk Assessment: Normal  Communication   Communication: Expressive difficulties  Cognition Arousal/Alertness: Awake/alert Behavior During Therapy: Flat affect Overall Cognitive Status: No family/caregiver present to determine baseline cognitive functioning                                 General Comments: PT introduced herself and pt kept repeating "[place]... The one that's married to  John?" Kept repeating that throughout the session. Pt very easily distracted throughout session and reaching for buttons on bed.       General Comments      Exercises     Assessment/Plan    PT Assessment Patient needs continued PT services  PT Problem List Decreased knowledge of precautions;Decreased safety awareness;Decreased knowledge of use of DME;Decreased cognition;Decreased mobility;Decreased balance;Decreased strength       PT Treatment Interventions Gait training;Functional mobility training;DME instruction;Therapeutic exercise;Therapeutic activities;Balance  training;Cognitive remediation;Patient/family education    PT Goals (Current goals can be found in the Care Plan section)  Acute Rehab PT Goals PT Goal Formulation: Patient unable to participate in goal setting Time For Goal Achievement: 12/05/19 Potential to Achieve Goals: Good    Frequency Min 2X/week   Barriers to discharge        Co-evaluation               AM-PAC PT "6 Clicks" Mobility  Outcome Measure Help needed turning from your back to your side while in a flat bed without using bedrails?: A Lot Help needed moving from lying on your back to sitting on the side of a flat bed without using bedrails?: A Lot Help needed moving to and from a bed to a chair (including a wheelchair)?: Total Help needed standing up from a chair using your arms (e.g., wheelchair or bedside chair)?: Total Help needed to walk in hospital room?: Total Help needed climbing 3-5 steps with a railing? : Total 6 Click Score: 8    End of Session Equipment Utilized During Treatment: Gait belt Activity Tolerance: Patient tolerated treatment well Patient left: in bed;with call bell/phone within reach;with bed alarm set Nurse Communication: Mobility status PT Visit Diagnosis: Other abnormalities of gait and mobility (R26.89);Unsteadiness on feet (R26.81);Muscle weakness (generalized) (M62.81);Difficulty in walking, not elsewhere classified (R26.2);Other symptoms and signs involving the nervous system (R29.898)    Time: 1730-1750 PT Time Calculation (min) (ACUTE ONLY): 20 min   Charges:   PT Evaluation $PT Eval Moderate Complexity: 1 Mod          Reuel Derby, PT, DPT  Acute Rehabilitation Services  Pager: [phone] Office: [phone]   Rudean Hitt 11/21/2019, 6:39 PM

## 2019-11-21 NOTE — Progress Notes (Signed)
PROGRESS NOTE    Patricia [name]  TDD:[id number] DOB: 1956/08/05 DOA: 11/18/2019 PCP: Ladell Pier, MD   Brief Narrative:  Patricia [name] is a 64 y.o. female with medical history significant of end-stage renal disease, diabetes mellitus type 2, hypertension, chronic anemia, congestive heart failure, depression, recent history of diverticulitis on antibiotics, history of periumbilical tachypnea presented to [facility] ED for change in mental status.  CT head was negative.  Patient uses PT for dialysis but patient had not had PT done for 3 days.  She was subsequently admitted to [facility] under hospital service.  Nephrology was consulted and she has been getting hemodialysis here.  Patient has remained severely depressed.  Often would not even make eye contact.  Does not answer question.  Follows all commands.  Remains a stable.  Psychiatry consulted yesterday.  Started on Celexa today.  Assessment & Plan:   Principal Problem:   Major depressive disorder, recurrent severe without psychotic features ([place]) Active Problems:   Dyslipidemia   Hypertension associated with diabetes ([place])   Anxiety and depression   CKD (chronic kidney disease) stage 5, GFR less than 15 ml/min (HCC)   Anemia due to stage 5 chronic kidney disease, not on chronic dialysis ([place])   Type II diabetes mellitus with renal manifestations (HCC)   Chronic combined systolic and diastolic CHF (congestive heart failure) (HCC)   Hypokalemia   Periumbilical hernia   ESRD on peritoneal dialysis ([place])   Sigmoid diverticulitis   Acute encephalopathy   Generalized weakness   Acute encephalopathy: Having evaluated for 3 consecutive days now where patient has stable vital signs and she is following all commands and she does not seem to have any focal neurological deficit however she would deliberately not answer any questions and breathing is also fine and she is very close to being mute made me think that she perhaps has severe  depression instead of acute encephalopathy which I am unable to determine unless she talks to me and answers my questions.  With this, we consulted psychiatry and they recommended starting her on Celexa 10 mg p.o. daily for 3 days followed by 20 mg.  I have placed those orders.  End-stage renal disease: She was on peritoneal dialysis but did not do her PD starting Thursday.  Nephrology consulted.  There was already a plan for her to switch to hemodialysis.  She received right IJ temporary HD catheter yesterday by IR.  She is getting dialysis here.  Hypokalemia: Resolved.  Recent history of diverticulitis: Discontinued her antibiotics.  Unknown whether she was taking at home or not.  She was not supposed to continue this at home.  Essential hypertension: Blood pressure remains elevated mostly because for some reason, her medications have been skipped.  She is on 4 different medications and it makes me challenging to add any further medications unless she gets all her medications as a scheduled for at least 24 hours.  She is on as needed hydralazine.  Chronic diastolic congestive heart failure: Euvolemic.  CAD: No symptoms.  Continue current medications.  Type 2 diabetes mellitus: Blood sugar control.  Start on SSI.  Last hemoglobin A1c 7.0.  Takes Lantus only 6 units at home which is on hold here.  Leukocytosis: Was mild mild with no signs of infection and she is afebrile.  White cells are normal now.  Blood culture negative.  Watch closely and repeat labs.  Acute on chronic anemia of chronic disease: Baseline hemoglobin between 8 and  9.  Now 7.8.  Repeat labs later today.  Transfuse if hemoglobin less than 7.  DVT prophylaxis: Heparin Code Status: Full code Family Communication:  None present at bedside.  I discussed with the daughter over the phone day before yesterday. Patient is from: Home Disposition Plan: To be determined Barriers to discharge: Clinical improvement, when cleared by  nephrology.  Consulting PT OT today.   Estimated body mass index is 24.75 kg/m as calculated from the following:   Height as of this encounter: 5\' 4"  (1.626 m).   Weight as of this encounter: 65.4 kg.      Nutritional status:               Consultants:   Nephrology  Procedures:   None  Antimicrobials:   None   Subjective: Seen and examined in dialysis today.  Once again, she would not even open her eyes however she constantly is flickering both her eyes with shaking and me talking to her.  Would intentionally not answer any question.  Objective: Vitals:   11/21/19 0830 11/21/19 0900 11/21/19 0930 11/21/19 1000  BP: (!) 170/87 (!) 187/94 (!) 184/91 (!) 170/85  Pulse: 99 98 99 99  Resp:      Temp:      TempSrc:      SpO2:      Weight:      Height:       No intake or output data in the 24 hours ending 11/21/19 1034 Filed Weights   11/18/19 1556 11/21/19 0725  Weight: 68.9 kg 65.4 kg    Examination:  General exam: Appears calm and comfortable  Respiratory system: Clear to auscultation. Respiratory effort normal. Cardiovascular system: S1 & S2 heard, RRR. No JVD, murmurs, rubs, gallops or clicks. No pedal edema. Gastrointestinal system: Abdomen is nondistended, soft and nontender. No organomegaly or masses felt. Normal bowel sounds heard. Central nervous system: Alert but unable to assess orientation since she is not talking to me.  Following all commands and does not have any focal deficit. Extremities: Symmetric 5 x 5 power. Skin: No rashes, lesions or ulcers.  Psychiatry: [facility] for me  Data Reviewed: I have personally reviewed following labs and imaging studies  CBC: Recent Labs  Lab 11/18/19 1734 11/18/19 2001 11/20/19 0451 11/21/19 0410  WBC 15.8*  --  11.6* 10.4  NEUTROABS 12.6*  --  8.9* 6.6  HGB 8.4* 8.2* 7.6* 7.8*  HCT 24.7* 24.0* 23.2* 24.0*  MCV 91.8  --  92.8 94.1  PLT 159  --  165 161   Basic Metabolic Panel: Recent Labs    Lab 11/18/19 1734 11/18/19 1734 11/18/19 2001 11/19/19 0609 11/19/19 1208 11/20/19 0451 11/21/19 0410  NA 132*  --  133* 135  --  137 140  K 2.0*   < > 2.4* 2.6* 2.9* 3.8 3.8  CL 93*  --   --  99  --  104 103  CO2 27  --   --  23  --  22 23  GLUCOSE 218*  --   --  151*  --  169* 91  BUN 45*  --   --  42*  --  46* 52*  CREATININE 6.29*  --   --  6.34*  --  6.45* 7.33*  CALCIUM 8.5*  --   --  8.4*  --  8.3* 8.5*  MG 1.9  --   --   --   --  2.6* 2.7*  PHOS  --   --   --   --   --  4.4 4.5   < > = values in this interval not displayed.   GFR: Estimated Creatinine Clearance: 7 mL/min (A) (by C-G formula based on SCr of 7.33 mg/dL (H)). Liver Function Tests: Recent Labs  Lab 11/18/19 1734 11/20/19 0451 11/21/19 0410  AST 17 13* 14*  ALT 14 12 12   ALKPHOS 67 43 44  BILITOT 0.5 0.4 0.6  PROT 6.1* 5.3* 5.6*  ALBUMIN 2.3* 1.8* 1.9*   No results for input(s): LIPASE, AMYLASE in the last 168 hours. Recent Labs  Lab 11/18/19 1734  AMMONIA 22   Coagulation Profile: Recent Labs  Lab 11/18/19 1734  INR 1.2   Cardiac Enzymes: No results for input(s): CKTOTAL, CKMB, CKMBINDEX, TROPONINI in the last 168 hours. BNP (last 3 results) No results for input(s): PROBNP in the last 8760 hours. HbA1C: Recent Labs    11/20/19 1300  HGBA1C 7.4*   CBG: Recent Labs  Lab 11/19/19 1452 11/20/19 1012 11/20/19 1302 11/20/19 1723 11/20/19 2129  GLUCAP 157* 125* 134* 116* 117*   Lipid Profile: No results for input(s): CHOL, HDL, LDLCALC, TRIG, CHOLHDL, LDLDIRECT in the last 72 hours. Thyroid Function Tests: Recent Labs    11/20/19 0451  TSH 0.402   Anemia Panel: No results for input(s): VITAMINB12, FOLATE, FERRITIN, TIBC, IRON, RETICCTPCT in the last 72 hours. Sepsis Labs: Recent Labs  Lab 11/18/19 1956  LATICACIDVEN 0.7    Recent Results (from the past 240 hour(s))  SARS CORONAVIRUS 2 (TAT 6-24 HRS) Nasopharyngeal Nasopharyngeal Swab     Status: None   Collection  Time: 11/18/19  7:00 PM   Specimen: Nasopharyngeal Swab  Result Value Ref Range Status   SARS Coronavirus 2 NEGATIVE NEGATIVE Final    Comment: (NOTE) SARS-CoV-2 target nucleic acids are NOT DETECTED. The SARS-CoV-2 RNA is generally detectable in upper and lower respiratory specimens during the acute phase of infection. Negative results do not preclude SARS-CoV-2 infection, do not rule out co-infections with other pathogens, and should not be used as the sole basis for treatment or other patient management decisions. Negative results must be combined with clinical observations, patient history, and epidemiological information. The expected result is Negative. Fact Sheet for Patients: SugarRoll.be Fact Sheet for Healthcare Providers: https://www.woods-mathews.com/ This test is not yet approved or cleared by the [place] FDA and  has been authorized for detection and/or diagnosis of SARS-CoV-2 by FDA under an Emergency Use Authorization (EUA). This EUA will remain  in effect (meaning this test can be used) for the duration of the COVID-19 declaration under Section 56 4(b)(1) of the Act, 21 U.S.C. section 360bbb-3(b)(1), unless the authorization is terminated or revoked sooner. Performed at [facility], [place] [address], [place], [place] [address]   Blood culture (routine x 2)     Status: None (Preliminary result)   Collection Time: 11/18/19  7:56 PM   Specimen: BLOOD RIGHT HAND  Result Value Ref Range Status   Specimen Description BLOOD RIGHT HAND  Final   Special Requests   Final    BOTTLES DRAWN AEROBIC AND ANAEROBIC Blood Culture results may not be optimal due to an inadequate volume of blood received in culture bottles Performed at [facility], [address] [place], [place] [address]    Culture   Final    NO GROWTH 3 DAYS Performed at [facility], [place] [address], [place], [place] [address]    Report  Status PENDING  Incomplete  Blood culture (routine x 2)  Status: None (Preliminary result)   Collection Time: 11/18/19  8:05 PM   Specimen: BLOOD LEFT HAND  Result Value Ref Range Status   Specimen Description BLOOD LEFT HAND  Final   Special Requests   Final    BOTTLES DRAWN AEROBIC AND ANAEROBIC Blood Culture adequate volume Performed at [facility], [address] [place], [place] [address]    Culture   Final    NO GROWTH 3 DAYS Performed at [facility], [place] [address] [place], [place] [address]    Report Status PENDING  Incomplete  SARS Coronavirus 2 by RT PCR (hospital order, performed in [facility] hospital lab) Nasopharyngeal Nasopharyngeal Swab     Status: None   Collection Time: 11/18/19  9:50 PM   Specimen: Nasopharyngeal Swab  Result Value Ref Range Status   SARS Coronavirus 2 NEGATIVE NEGATIVE Final    Comment: Performed at [facility], [address] [place], [place] [address]      Radiology Studies: IR Fluoro Guide CV Line Right  Result Date: 11/20/2019 INDICATION: ENCEPHALOPATHY, END-STAGE RENAL DISEASE, NO CURRENT ACCESS FOR DIALYSIS EXAM: ULTRASOUND GUIDANCE FOR VASCULAR ACCESS RIGHT IJ TEMPORARY DIALYSIS CATHETER (16 CM MAHURKAR CATHETER) MEDICATIONS: 1% lidocaine local ANESTHESIA/SEDATION: Moderate Sedation Time: None. The patient's level of consciousness and vital signs were monitored continuously by radiology nursing throughout the procedure under my direct supervision. FLUOROSCOPY TIME:  Fluoroscopy Time: 0 minutes 24 seconds ( mGy). COMPLICATIONS: None immediate. PROCEDURE: Informed written consent was obtained from the patient's family after a thorough discussion of the procedural risks, benefits and alternatives. All questions were addressed. Maximal Sterile Barrier Technique was utilized including caps, mask, sterile gowns, sterile gloves, sterile drape, hand hygiene and skin antiseptic. A timeout was performed prior to the  initiation of the procedure. Under sterile conditions and local anesthesia, ultrasound micropuncture access performed of the right internal jugular vein. Images obtained for documentation of the patent right internal jugular vein. Guidewire advanced easily under fluoroscopy followed by the micro dilator set. Amplatz guidewire inserted followed by tract dilatation to insert a 16 cm Mahurkar temporary dialysis catheter. Tip SVC RA junction. Position confirmed with fluoroscopy. Images obtained for documentation. Blood aspirated easily followed by saline and heparin flushes. Appropriate volume and strength of heparin instilled in all 3 lumens followed by external caps. Catheter secured Prolene sutures and a sterile dressing. No immediate complication. Patient tolerated the procedure well. IMPRESSION: Successful ultrasound and fluoroscopic right IJ temporary dialysis catheter (16 cm Mahurkar catheter). Tip SVC RA junction. Ready for use. Electronically Signed   By: Jerilynn Mages.  Shick M.D.   On: 11/20/2019 15:33   IR US Guide Vasc Access Right  Result Date: 11/20/2019 INDICATION: ENCEPHALOPATHY, END-STAGE RENAL DISEASE, NO CURRENT ACCESS FOR DIALYSIS EXAM: ULTRASOUND GUIDANCE FOR VASCULAR ACCESS RIGHT IJ TEMPORARY DIALYSIS CATHETER (16 CM MAHURKAR CATHETER) MEDICATIONS: 1% lidocaine local ANESTHESIA/SEDATION: Moderate Sedation Time: None. The patient's level of consciousness and vital signs were monitored continuously by radiology nursing throughout the procedure under my direct supervision. FLUOROSCOPY TIME:  Fluoroscopy Time: 0 minutes 24 seconds ( mGy). COMPLICATIONS: None immediate. PROCEDURE: Informed written consent was obtained from the patient's family after a thorough discussion of the procedural risks, benefits and alternatives. All questions were addressed. Maximal Sterile Barrier Technique was utilized including caps, mask, sterile gowns, sterile gloves, sterile drape, hand hygiene and skin antiseptic. A timeout was  performed prior to the initiation of the procedure. Under sterile conditions and local anesthesia, ultrasound micropuncture  access performed of the right internal jugular vein. Images obtained for documentation of the patent right internal jugular vein. Guidewire advanced easily under fluoroscopy followed by the micro dilator set. Amplatz guidewire inserted followed by tract dilatation to insert a 16 cm Mahurkar temporary dialysis catheter. Tip SVC RA junction. Position confirmed with fluoroscopy. Images obtained for documentation. Blood aspirated easily followed by saline and heparin flushes. Appropriate volume and strength of heparin instilled in all 3 lumens followed by external caps. Catheter secured Prolene sutures and a sterile dressing. No immediate complication. Patient tolerated the procedure well. IMPRESSION: Successful ultrasound and fluoroscopic right IJ temporary dialysis catheter (16 cm Mahurkar catheter). Tip SVC RA junction. Ready for use. Electronically Signed   By: Jerilynn Mages.  Shick M.D.   On: 11/20/2019 15:33    Scheduled Meds: . aspirin EC  81 mg Oral Daily  . atorvastatin  10 mg Oral q1800  . calcitRIOL  0.25 mcg Oral Daily  . carvedilol  25 mg Oral BID WC  . Chlorhexidine Gluconate Cloth  6 each Topical Q0600  . citalopram  10 mg Oral Daily   And  . [START ON 11/24/2019] citalopram  20 mg Oral Daily  . feeding supplement (PRO-STAT SUGAR FREE 64)  30 mL Oral BID  . furosemide  80 mg Oral Daily  . gabapentin  100 mg Oral QHS  . heparin      . heparin  5,000 Units Subcutaneous Q8H  . hydrALAZINE  100 mg Oral Q8H  . insulin aspart  0-15 Units Subcutaneous TID WC  . insulin aspart  0-5 Units Subcutaneous QHS  . isosorbide mononitrate  15 mg Oral Daily  . lisinopril  40 mg Oral Daily  . sevelamer carbonate  800 mg Oral TID WC  . sodium chloride flush  3 mL Intravenous Q12H  . sodium chloride flush  3 mL Intravenous Q12H  . spironolactone  25 mg Oral Daily   Continuous Infusions: .  sodium chloride Stopped (11/18/19 2357)  . sodium chloride    . sodium chloride    . sodium chloride       LOS: 2 days   Time spent: 29 minutes   Darliss Cheney, MD [facility]  11/21/2019, 10:34 AM   To contact the attending provider between 7A-7P or the covering provider during after hours 7P-7A, please log into the web site [email or web address].

## 2019-11-22 DIAGNOSIS — Z794 Long term (current) use of insulin: Secondary | ICD-10-CM

## 2019-11-22 DIAGNOSIS — E1122 Type 2 diabetes mellitus with diabetic chronic kidney disease: Secondary | ICD-10-CM

## 2019-11-22 DIAGNOSIS — N186 End stage renal disease: Secondary | ICD-10-CM

## 2019-11-22 DIAGNOSIS — I5042 Chronic combined systolic (congestive) and diastolic (congestive) heart failure: Secondary | ICD-10-CM

## 2019-11-22 DIAGNOSIS — F329 Major depressive disorder, single episode, unspecified: Secondary | ICD-10-CM

## 2019-11-22 DIAGNOSIS — N185 Chronic kidney disease, stage 5: Secondary | ICD-10-CM

## 2019-11-22 DIAGNOSIS — F419 Anxiety disorder, unspecified: Secondary | ICD-10-CM

## 2019-11-22 DIAGNOSIS — N2581 Secondary hyperparathyroidism of renal origin: Secondary | ICD-10-CM | POA: Diagnosis not present

## 2019-11-22 DIAGNOSIS — Z992 Dependence on renal dialysis: Secondary | ICD-10-CM | POA: Diagnosis not present

## 2019-11-22 DIAGNOSIS — F323 Major depressive disorder, single episode, severe with psychotic features: Secondary | ICD-10-CM

## 2019-11-22 DIAGNOSIS — I10 Essential (primary) hypertension: Secondary | ICD-10-CM

## 2019-11-22 DIAGNOSIS — E876 Hypokalemia: Secondary | ICD-10-CM

## 2019-11-22 DIAGNOSIS — D631 Anemia in chronic kidney disease: Secondary | ICD-10-CM

## 2019-11-22 DIAGNOSIS — E785 Hyperlipidemia, unspecified: Secondary | ICD-10-CM

## 2019-11-22 DIAGNOSIS — E1159 Type 2 diabetes mellitus with other circulatory complications: Secondary | ICD-10-CM

## 2019-11-22 LAB — COMPREHENSIVE METABOLIC PANEL
ALT: 12 U/L (ref 0–44)
AST: 20 U/L (ref 15–41)
Albumin: 1.9 g/dL — ABNORMAL LOW (ref 3.5–5.0)
Alkaline Phosphatase: 37 U/L — ABNORMAL LOW (ref 38–126)
Anion gap: 11 (ref 5–15)
BUN: 28 mg/dL — ABNORMAL HIGH (ref 8–23)
CO2: 25 mmol/L (ref 22–32)
Calcium: 8.2 mg/dL — ABNORMAL LOW (ref 8.9–10.3)
Chloride: 100 mmol/L (ref 98–111)
Creatinine, Ser: 4.54 mg/dL — ABNORMAL HIGH (ref 0.44–1.00)
GFR calc Af Amer: 11 mL/min — ABNORMAL LOW (ref >60)
GFR calc non Af Amer: 10 mL/min — ABNORMAL LOW (ref >60)
Glucose, Bld: 99 mg/dL (ref 70–99)
Potassium: 3.4 mmol/L — ABNORMAL LOW (ref 3.5–5.1)
Sodium: 136 mmol/L (ref 135–145)
Total Bilirubin: 0.9 mg/dL (ref 0.3–1.2)
Total Protein: 5.1 g/dL — ABNORMAL LOW (ref 6.5–8.1)

## 2019-11-22 LAB — CBC
HCT: 25 % — ABNORMAL LOW (ref 36.0–46.0)
Hemoglobin: 7.9 g/dL — ABNORMAL LOW (ref 12.0–15.0)
MCH: 30.6 pg (ref 26.0–34.0)
MCHC: 31.6 g/dL (ref 30.0–36.0)
MCV: 96.9 fL (ref 80.0–100.0)
Platelets: 198 10*3/uL (ref 150–400)
RBC: 2.58 MIL/uL — ABNORMAL LOW (ref 3.87–5.11)
RDW: 14 % (ref 11.5–15.5)
WBC: 10.7 10*3/uL — ABNORMAL HIGH (ref 4.0–10.5)
nRBC: 0 % (ref 0.0–0.2)

## 2019-11-22 LAB — MAGNESIUM: Magnesium: 2.2 mg/dL (ref 1.7–2.4)

## 2019-11-22 LAB — PHOSPHORUS: Phosphorus: 3.5 mg/dL (ref 2.5–4.6)

## 2019-11-22 LAB — GLUCOSE, CAPILLARY
Glucose-Capillary: 110 mg/dL — ABNORMAL HIGH (ref 70–99)
Glucose-Capillary: 92 mg/dL (ref 70–99)
Glucose-Capillary: 77 mg/dL (ref 70–99)

## 2019-11-22 MED ORDER — ALTEPLASE 2 MG IJ SOLR: 4.0000 mg | Freq: Once | INTRAMUSCULAR | Status: AC

## 2019-11-22 MED ORDER — SODIUM CHLORIDE 0.9% FLUSH
10.0000 mL | Freq: Two times a day (BID) | INTRAVENOUS | Status: DC
  Administered 2019-11-25: 10 mL

## 2019-11-22 MED ORDER — ALTEPLASE 2 MG IJ SOLR
INTRAMUSCULAR | Status: AC
  Administered 2019-11-22: 2.6 mg
  Filled 2019-11-22: qty 4

## 2019-11-22 MED ORDER — DARBEPOETIN ALFA 40 MCG/0.4ML IJ SOSY
PREFILLED_SYRINGE | INTRAMUSCULAR | Status: AC
  Filled 2019-11-22: qty 0.4

## 2019-11-22 MED ORDER — LORAZEPAM 2 MG/ML IJ SOLN
1.0000 mg | Freq: Once | INTRAMUSCULAR | Status: AC
  Administered 2019-11-22: 1 mg via INTRAVENOUS
  Filled 2019-11-22: qty 1

## 2019-11-22 MED ORDER — HEPARIN SODIUM (PORCINE) 1000 UNIT/ML IJ SOLN
INTRAMUSCULAR | Status: AC
  Filled 2019-11-22: qty 4

## 2019-11-22 MED ORDER — LORAZEPAM 2 MG/ML IJ SOLN
2.0000 mg | Freq: Once | INTRAMUSCULAR | Status: AC
  Administered 2019-11-22: 2 mg via INTRAVENOUS
  Filled 2019-11-22: qty 1

## 2019-11-22 MED ORDER — HALOPERIDOL LACTATE 5 MG/ML IJ SOLN: 2.0000 mg | Freq: Four times a day (QID) | INTRAMUSCULAR | Status: DC | PRN

## 2019-11-22 MED ORDER — SODIUM CHLORIDE 0.9% FLUSH: 10.0000 mL | INTRAVENOUS | Status: DC | PRN

## 2019-11-22 MED ORDER — POTASSIUM CHLORIDE CRYS ER 20 MEQ PO TBCR
20.0000 meq | EXTENDED_RELEASE_TABLET | Freq: Once | ORAL | Status: AC
  Administered 2019-11-22: 20 meq via ORAL
  Filled 2019-11-22: qty 1

## 2019-11-22 NOTE — Progress Notes (Addendum)
Subjective:  Seen in room  With sitter needed / now AMS  With agitation   Objective Vital signs in last 24 hours: Vitals:   11/21/19 1200 11/21/19 1700 11/21/19 2135 11/22/19 0936  BP: (!) 171/84 (!) 162/78 (!) 158/72 (!) 175/84  Pulse: 97 89 90 (!) 105  Resp:  18 16   Temp: 98.8 F (37.1 C) 98 F (36.7 C) 98.8 F (37.1 C)   TempSrc: Oral Oral Oral   SpO2: 94% 98% 100%   Weight:      Height:       Weight change:   Physical Exam: General: Elderly AAF confused  Trying to get up out of bed agitated  will not let us exam her //   mittens on / still has R ij temp hd cath   OPDialysis Orders: CCPD 7xwk, EDW 68.5kg 5 exchanges Fill Vol 2.4L, Dwell time 1.5hr No last fill/day exchange  Problem/Plan:: 1. AMS - CT head negative. UA not indicative of infection.BC No growth so far  / was somnolent  on admit until after HD yest 02/09   =post hd eyes open shook head yes no to questions  this am Now is agitated /confused /delirious.   Admit consulting Psy. / Celexa was started Pt sig more alert and awake after one HD which suggests uremia as contributing factor. Will get another HD today. Unfortunately is agitated and combative , so we will hope she will be able to dialyze okay today.   Hypokalemia - K 2.0 >now resolved   to 3.8  after /K supplemented. / using 4k bath on hd  2. ESRD- PD > transitioning to HD this week / 1st HD yest .tolerated with temp hd cath / Will need tunneled perm cath (no current indication for infection ) > have  re-consult IR for PERM cath  When stable . Last PD last  Thursday 02//04/21./ Clip to OP center [facility] / but noted plan for Transfer to [facility] If stable  Thursday for  Abdominal Surgery with Dr Raul Del concerning her PD cath  3. Hypertension/volume- Blood pressure elevated,today  But with agitation/  continue home meds. no excess vol on exam . Plan for minimal UF with initial dialysis.  4. Anemiaof CKD- Hgb 8.4.admit > 7.4> 7.8 last  mircera given on 12/15. Order aranesp 138mcg with HD. 5. Secondary Hyperparathyroidism -Ca at goal.Phos 4.5Renvela binders not on  VDRA. 6. Nutrition-.renal diet  renal vit   Ernest Haber, PA-C [facility] [phone] 11/22/2019,11:53 AM  LOS: 3 days   Pt seen, examined and agree w assess/plan as above with additions as indicated.  [facility] 11/22/2019, 5:25 PM     Labs: Basic Metabolic Panel: Recent Labs  Lab 11/19/19 0609 11/19/19 0609 11/19/19 1208 11/20/19 0451 11/21/19 0410  NA 135  --   --  137 140  K 2.6*   < > 2.9* 3.8 3.8  CL 99  --   --  104 103  CO2 23  --   --  22 23  GLUCOSE 151*  --   --  169* 91  BUN 42*  --   --  46* 52*  CREATININE 6.34*  --   --  6.45* 7.33*  CALCIUM 8.4*  --   --  8.3* 8.5*  PHOS  --   --   --  4.4 4.5   < > = values in this interval not displayed.   Liver Function Tests: Recent  Labs  Lab 11/18/19 1734 11/20/19 0451 11/21/19 0410  AST 17 13* 14*  ALT 14 12 12   ALKPHOS 67 43 44  BILITOT 0.5 0.4 0.6  PROT 6.1* 5.3* 5.6*  ALBUMIN 2.3* 1.8* 1.9*   No results for input(s): LIPASE, AMYLASE in the last 168 hours. Recent Labs  Lab 11/18/19 1734  AMMONIA 22   CBC: Recent Labs  Lab 11/18/19 1734 11/18/19 1734 11/18/19 2001 11/20/19 0451 11/21/19 0410  WBC 15.8*  --   --  11.6* 10.4  NEUTROABS 12.6*  --   --  8.9* 6.6  HGB 8.4*   < > 8.2* 7.6* 7.8*  HCT 24.7*   < > 24.0* 23.2* 24.0*  MCV 91.8  --   --  92.8 94.1  PLT 159  --   --  165 194   < > = values in this interval not displayed.   Cardiac Enzymes: No results for input(s): CKTOTAL, CKMB, CKMBINDEX, TROPONINI in the last 168 hours. CBG: Recent Labs  Lab 11/21/19 1143 11/21/19 1554 11/21/19 2136 11/22/19 0741 11/22/19 1130  GLUCAP 89 108* 149* 110* 92    Studies/Results: IR Fluoro Guide CV Line Right  Result Date: 11/20/2019 INDICATION: ENCEPHALOPATHY, END-STAGE RENAL DISEASE, NO CURRENT ACCESS FOR  DIALYSIS EXAM: ULTRASOUND GUIDANCE FOR VASCULAR ACCESS RIGHT IJ TEMPORARY DIALYSIS CATHETER (16 CM MAHURKAR CATHETER) MEDICATIONS: 1% lidocaine local ANESTHESIA/SEDATION: Moderate Sedation Time: None. The patient's level of consciousness and vital signs were monitored continuously by radiology nursing throughout the procedure under my direct supervision. FLUOROSCOPY TIME:  Fluoroscopy Time: 0 minutes 24 seconds ( mGy). COMPLICATIONS: None immediate. PROCEDURE: Informed written consent was obtained from the patient's family after a thorough discussion of the procedural risks, benefits and alternatives. All questions were addressed. Maximal Sterile Barrier Technique was utilized including caps, mask, sterile gowns, sterile gloves, sterile drape, hand hygiene and skin antiseptic. A timeout was performed prior to the initiation of the procedure. Under sterile conditions and local anesthesia, ultrasound micropuncture access performed of the right internal jugular vein. Images obtained for documentation of the patent right internal jugular vein. Guidewire advanced easily under fluoroscopy followed by the micro dilator set. Amplatz guidewire inserted followed by tract dilatation to insert a 16 cm Mahurkar temporary dialysis catheter. Tip SVC RA junction. Position confirmed with fluoroscopy. Images obtained for documentation. Blood aspirated easily followed by saline and heparin flushes. Appropriate volume and strength of heparin instilled in all 3 lumens followed by external caps. Catheter secured Prolene sutures and a sterile dressing. No immediate complication. Patient tolerated the procedure well. IMPRESSION: Successful ultrasound and fluoroscopic right IJ temporary dialysis catheter (16 cm Mahurkar catheter). Tip SVC RA junction. Ready for use. Electronically Signed   By: Jerilynn Mages.  Shick M.D.   On: 11/20/2019 15:33   IR US Guide Vasc Access Right  Result Date: 11/20/2019 INDICATION: ENCEPHALOPATHY, END-STAGE RENAL  DISEASE, NO CURRENT ACCESS FOR DIALYSIS EXAM: ULTRASOUND GUIDANCE FOR VASCULAR ACCESS RIGHT IJ TEMPORARY DIALYSIS CATHETER (16 CM MAHURKAR CATHETER) MEDICATIONS: 1% lidocaine local ANESTHESIA/SEDATION: Moderate Sedation Time: None. The patient's level of consciousness and vital signs were monitored continuously by radiology nursing throughout the procedure under my direct supervision. FLUOROSCOPY TIME:  Fluoroscopy Time: 0 minutes 24 seconds ( mGy). COMPLICATIONS: None immediate. PROCEDURE: Informed written consent was obtained from the patient's family after a thorough discussion of the procedural risks, benefits and alternatives. All questions were addressed. Maximal Sterile Barrier Technique was utilized including caps, mask, sterile gowns, sterile gloves, sterile drape, hand hygiene and skin antiseptic.  A timeout was performed prior to the initiation of the procedure. Under sterile conditions and local anesthesia, ultrasound micropuncture access performed of the right internal jugular vein. Images obtained for documentation of the patent right internal jugular vein. Guidewire advanced easily under fluoroscopy followed by the micro dilator set. Amplatz guidewire inserted followed by tract dilatation to insert a 16 cm Mahurkar temporary dialysis catheter. Tip SVC RA junction. Position confirmed with fluoroscopy. Images obtained for documentation. Blood aspirated easily followed by saline and heparin flushes. Appropriate volume and strength of heparin instilled in all 3 lumens followed by external caps. Catheter secured Prolene sutures and a sterile dressing. No immediate complication. Patient tolerated the procedure well. IMPRESSION: Successful ultrasound and fluoroscopic right IJ temporary dialysis catheter (16 cm Mahurkar catheter). Tip SVC RA junction. Ready for use. Electronically Signed   By: Jerilynn Mages.  Shick M.D.   On: 11/20/2019 15:33   Medications: . sodium chloride Stopped (11/18/19 2357)  . sodium chloride      . aspirin EC  81 mg Oral Daily  . atorvastatin  10 mg Oral q1800  . calcitRIOL  0.25 mcg Oral Daily  . carvedilol  25 mg Oral BID WC  . Chlorhexidine Gluconate Cloth  6 each Topical Q0600  . citalopram  10 mg Oral Daily   And  . [START ON 11/24/2019] citalopram  20 mg Oral Daily  . darbepoetin (ARANESP) injection - DIALYSIS  40 mcg Intravenous Q Wed-HD  . feeding supplement (PRO-STAT SUGAR FREE 64)  30 mL Oral BID  . furosemide  80 mg Oral Daily  . gabapentin  100 mg Oral QHS  . heparin  5,000 Units Subcutaneous Q8H  . hydrALAZINE  100 mg Oral Q8H  . insulin aspart  0-15 Units Subcutaneous TID WC  . insulin aspart  0-5 Units Subcutaneous QHS  . isosorbide mononitrate  15 mg Oral Daily  . lisinopril  40 mg Oral Daily  . multivitamin  1 tablet Oral QHS  . sevelamer carbonate  800 mg Oral TID WC  . sodium chloride flush  10-40 mL Intracatheter Q12H  . sodium chloride flush  3 mL Intravenous Q12H  . sodium chloride flush  3 mL Intravenous Q12H  . spironolactone  25 mg Oral Daily

## 2019-11-22 NOTE — Progress Notes (Signed)
PROGRESS NOTE    LEVAEH VICE  VEL:[id number] DOB: 12-07-1955 DOA: 11/18/2019 PCP: Ladell Pier, MD  Brief Narrative:  The patient Patricia [name] a 64 y.o.femalewith medical history significant ofend-stage renal disease, diabetes mellitus type 2, hypertension, chronic anemia, congestive heart failure, depression, recent history of diverticulitis on antibiotics, history of periumbilical tachypnea presented to [facility] ED for change in mental status.  CT head was negative.  Patient uses PT for dialysis but patient had not had PT done for 3 days.  She was subsequently admitted to [facility] under hospital service.    Nephrology was consulted and she has been getting hemodialysis here.  Patient has remained severely depressed and Often would not even make eye contact and Does not answer questions yesterday.  Psychiatry consulted and she was started on Celexa.  She was much more awake but was very confused and agitated and try to climb out of bed and screaming.  She was given IV lorazepam and taken for dialysis that she needed a tunneled dialysis catheter replaced as she is being converted to hemodialysis.  Because she remained encephalopathic and severely agitated psychiatry was reconsulted and they are still pending to see the patient again.  Assessment & Plan:   Principal Problem:   Major depressive disorder, recurrent severe without psychotic features ([place]) Active Problems:   Dyslipidemia   Hypertension associated with diabetes ([place])   Anxiety and depression   CKD (chronic kidney disease) stage 5, GFR less than 15 ml/min (HCC)   Anemia due to stage 5 chronic kidney disease, not on chronic dialysis ([place])   Type II diabetes mellitus with renal manifestations (HCC)   Chronic combined systolic and diastolic CHF (congestive heart failure) (HCC)   Hypokalemia   Periumbilical hernia   ESRD on peritoneal dialysis ([place])   Sigmoid diverticulitis   Acute encephalopathy   Generalized  weakness  Acute Encephalopathy with significant agitation and confusion -Initially would not answer questions and likely had severe depression so psychiatry was consulted and they started her on Celexa 10 mg p.o. daily for 3 days followed by 20 mg p.o. daily however today she was significantly agitated and combative trying to climb out of bed and had AMS -Recent head CT done showed no acute intracranial abnormality -Given IV lorazepam 2 mg for her agitation and unfortunately cannot use Haldol given her prolonged QTC -Psychiatry was consulted again for her significant behavioral disturbances and agitation -He continues to have significant disturbances may warrant an MRI and EEG  End-stage renal disease now on hemodialysis -She was on peritoneal dialysis but did not do her PD starting Thursday.   -Nephrology consulted.   -There was already a plan for her to switch to hemodialysis.  She received right IJ temporary HD catheter by IR and now will need a tunneled dialysis catheter.  She is getting dialysis here and was dialyzed today -Patient's BUN/creatinine went from 52/7.23 is now 28/4.54 -Avoid nephrotoxic medications, contrast dyes, hypotension and renally dose medications -Repeat CMP in a.m.  Hypokalemia -Mild as potassium was 3.4 -Replete with 20 mEq p.o. -Continue monitor and replete as necessary  -Repeat CMP in a.m.  Recent history of diverticulitis - Discontinued her antibiotics of Augmentin Cipro.  Unknown whether she was taking at home or not.   -She was not supposed to continue this at home.  Essential hypertension -Blood pressure remains elevated mostly because for some reason, her medications have been skipped and now she was significantly agitated.   -She is  on 4 different medications and it makes me challenging to add any further medications unless she gets all her medications as a scheduled for at least 24 hours.   -She is on as needed hydralazine.  Chronic diastolic  congestive heart failure: -Currently appears euvolemic and will be dialyzed today  CAD -No symptoms. Continue current medications.  Type 2 diabetes mellitus -Blood sugar control.   -Start on SSI.  Last hemoglobin A1c 7.0.   -Takes Lantus only 6 units at home which is on hold here. -CBG's range from 92-149  Leukocytosis -Was mild mild with no signs of infection and she is afebrile. -WBC went from 10.4 -> 10.7 -Blood Cx x2 showed NGTD at 4 Days  -CXR was Negative and showed no active Cardiopulmonary Disease  Acute on chronic anemia of chronic disease -Baseline hemoglobin between 8 and 9.  Now 7.9.  -Transfuse if hemoglobin less than 7. -Today to monitor for signs and symptoms of bleeding; currently no overt bleeding or -Repeat CBC in a.m.  DVT prophylaxis: Heparin 5,000 units sq q8h Code Status: FULL CODE  Family Communication: No family present at bedside  Disposition Plan: SNF when Nephrology workup is complete and patient is back to baseline and no longer encephlopathic and confused and combative    Consultants:   Nephrology  Interventional Radiology   Procedures:  Hemodialysis    Antimicrobials:  Anti-infectives (From admission, onward)   Start     Dose/Rate Route Frequency Ordered Stop   11/19/19 1000  amoxicillin-clavulanate (AUGMENTIN) 875-125 MG per tablet 1 tablet  Status:  Discontinued     1 tablet Oral Every 12 hours 11/19/19 0555 11/19/19 0749   11/19/19 1000  metroNIDAZOLE (FLAGYL) tablet 500 mg  Status:  Discontinued     500 mg Oral Every 12 hours 11/19/19 0555 11/19/19 0749     Subjective: Patient was seen at bedside however she would not let me examine her and tried to push me and fight back and she tried to swing at me.  She is very delusional and screaming trying to climb out of bed when I went to go see her.  No other concerns or complaints this time per nursing.  Objective: Vitals:   11/21/19 1200 11/21/19 1700 11/21/19 2135 11/22/19 0936   BP: (!) 171/84 (!) 162/78 (!) 158/72 (!) 175/84  Pulse: 97 89 90 (!) 105  Resp:  18 16   Temp: 98.8 F (37.1 C) 98 F (36.7 C) 98.8 F (37.1 C)   TempSrc: Oral Oral Oral   SpO2: 94% 98% 100%   Weight:      Height:        Intake/Output Summary (Last 24 hours) at 11/22/2019 1316 Last data filed at 11/22/2019 0900 Gross per 24 hour  Intake 120 ml  Output 100 ml  Net 20 ml   Filed Weights   11/18/19 1556 11/21/19 0725 11/21/19 1030  Weight: 68.9 kg 65.4 kg 65.2 kg   Examination: Physical Exam:  Constitutional: WN/WD African-American female currently who is combative and very confused and trying to climb out of bed,  Eyes: Lids and conjunctivae normal, sclerae anicteric  ENMT: External Ears, Nose appear normal. Grossly normal hearing Neck: No visible JVD Respiratory: Has equal on unlabored respirations on visible exam Cardiovascular: Would not let me examine due to her current condition  Abdomen: Would not let me examine due to her current condition  GU: Deferred. Musculoskeletal: No clubbing / cyanosis of digits/nails. No joint deformity upper and lower extremities. Skin:  No rashes, lesions, ulcers on a limited skin evaluation. No induration; Warm and dry.  Neurologic: Does not follow commands and is severely agitated  Psychiatric: Impaired judgment and insight. Not Alert and oriented x 3. Severely agitated and anxious and trying to climb out of bed.   Data Reviewed: I have personally reviewed following labs and imaging studies  CBC: Recent Labs  Lab 11/18/19 1734 11/18/19 2001 11/20/19 0451 11/21/19 0410  WBC 15.8*  --  11.6* 10.4  NEUTROABS 12.6*  --  8.9* 6.6  HGB 8.4* 8.2* 7.6* 7.8*  HCT 24.7* 24.0* 23.2* 24.0*  MCV 91.8  --  92.8 94.1  PLT 159  --  165 270   Basic Metabolic Panel: Recent Labs  Lab 11/18/19 1734 11/18/19 1734 11/18/19 2001 11/19/19 0609 11/19/19 1208 11/20/19 0451 11/21/19 0410  NA 132*  --  133* 135  --  137 140  K 2.0*   < > 2.4*  2.6* 2.9* 3.8 3.8  CL 93*  --   --  99  --  104 103  CO2 27  --   --  23  --  22 23  GLUCOSE 218*  --   --  151*  --  169* 91  BUN 45*  --   --  42*  --  46* 52*  CREATININE 6.29*  --   --  6.34*  --  6.45* 7.33*  CALCIUM 8.5*  --   --  8.4*  --  8.3* 8.5*  MG 1.9  --   --   --   --  2.6* 2.7*  PHOS  --   --   --   --   --  4.4 4.5   < > = values in this interval not displayed.   GFR: Estimated Creatinine Clearance: 7 mL/min (A) (by C-G formula based on SCr of 7.33 mg/dL (H)). Liver Function Tests: Recent Labs  Lab 11/18/19 1734 11/20/19 0451 11/21/19 0410  AST 17 13* 14*  ALT 14 12 12   ALKPHOS 67 43 44  BILITOT 0.5 0.4 0.6  PROT 6.1* 5.3* 5.6*  ALBUMIN 2.3* 1.8* 1.9*   No results for input(s): LIPASE, AMYLASE in the last 168 hours. Recent Labs  Lab 11/18/19 1734  AMMONIA 22   Coagulation Profile: Recent Labs  Lab 11/18/19 1734  INR 1.2   Cardiac Enzymes: No results for input(s): CKTOTAL, CKMB, CKMBINDEX, TROPONINI in the last 168 hours. BNP (last 3 results) No results for input(s): PROBNP in the last 8760 hours. HbA1C: Recent Labs    11/20/19 1300  HGBA1C 7.4*   CBG: Recent Labs  Lab 11/21/19 1143 11/21/19 1554 11/21/19 2136 11/22/19 0741 11/22/19 1130  GLUCAP 89 108* 149* 110* 92   Lipid Profile: No results for input(s): CHOL, HDL, LDLCALC, TRIG, CHOLHDL, LDLDIRECT in the last 72 hours. Thyroid Function Tests: Recent Labs    11/20/19 0451  TSH 0.402   Anemia Panel: No results for input(s): VITAMINB12, FOLATE, FERRITIN, TIBC, IRON, RETICCTPCT in the last 72 hours. Sepsis Labs: Recent Labs  Lab 11/18/19 1956  LATICACIDVEN 0.7    Recent Results (from the past 240 hour(s))  SARS CORONAVIRUS 2 (TAT 6-24 HRS) Nasopharyngeal Nasopharyngeal Swab     Status: None   Collection Time: 11/18/19  7:00 PM   Specimen: Nasopharyngeal Swab  Result Value Ref Range Status   SARS Coronavirus 2 NEGATIVE NEGATIVE Final    Comment: (NOTE) SARS-CoV-2 target  nucleic acids are NOT DETECTED. The SARS-CoV-2 RNA is generally  detectable in upper and lower respiratory specimens during the acute phase of infection. Negative results do not preclude SARS-CoV-2 infection, do not rule out co-infections with other pathogens, and should not be used as the sole basis for treatment or other patient management decisions. Negative results must be combined with clinical observations, patient history, and epidemiological information. The expected result is Negative. Fact Sheet for Patients: SugarRoll.be Fact Sheet for Healthcare Providers: https://www.woods-mathews.com/ This test is not yet approved or cleared by the [place] FDA and  has been authorized for detection and/or diagnosis of SARS-CoV-2 by FDA under an Emergency Use Authorization (EUA). This EUA will remain  in effect (meaning this test can be used) for the duration of the COVID-19 declaration under Section 56 4(b)(1) of the Act, 21 U.S.C. section 360bbb-3(b)(1), unless the authorization is terminated or revoked sooner. Performed at [facility], [place] [address], [place], [place] [address]   Blood culture (routine x 2)     Status: None (Preliminary result)   Collection Time: 11/18/19  7:56 PM   Specimen: BLOOD RIGHT HAND  Result Value Ref Range Status   Specimen Description BLOOD RIGHT HAND  Final   Special Requests   Final    BOTTLES DRAWN AEROBIC AND ANAEROBIC Blood Culture results may not be optimal due to an inadequate volume of blood received in culture bottles Performed at [facility], [address] [place], [place] [address]    Culture   Final    NO GROWTH 4 DAYS Performed at [facility], [place] [employer] [address] [place], [place] [address]    Report Status PENDING  Incomplete  Blood culture (routine x 2)     Status: None (Preliminary result)   Collection Time: 11/18/19  8:05 PM   Specimen: BLOOD LEFT HAND  Result Value  Ref Range Status   Specimen Description BLOOD LEFT HAND  Final   Special Requests   Final    BOTTLES DRAWN AEROBIC AND ANAEROBIC Blood Culture adequate volume Performed at [facility], [address] [place], [place] [address]    Culture   Final    NO GROWTH 4 DAYS Performed at [facility], [place] [address], [place], [place] [address]    Report Status PENDING  Incomplete  SARS Coronavirus 2 by RT PCR (hospital order, performed in [facility] hospital lab) Nasopharyngeal Nasopharyngeal Swab     Status: None   Collection Time: 11/18/19  9:50 PM   Specimen: Nasopharyngeal Swab  Result Value Ref Range Status   SARS Coronavirus 2 NEGATIVE NEGATIVE Final    Comment: Performed at [facility], [address] [place], [place] [address]     RN Pressure Injury Documentation:     Estimated body mass index is 24.67 kg/m as calculated from the following:   Height as of this encounter: 5\' 4"  (1.626 m).   Weight as of this encounter: 65.2 kg.  Malnutrition Type:      Malnutrition Characteristics:      Nutrition Interventions:       Radiology Studies: IR Fluoro Guide CV Line Right  Result Date: 11/20/2019 INDICATION: ENCEPHALOPATHY, END-STAGE RENAL DISEASE, NO CURRENT ACCESS FOR DIALYSIS EXAM: ULTRASOUND GUIDANCE FOR VASCULAR ACCESS RIGHT IJ TEMPORARY DIALYSIS CATHETER (16 CM MAHURKAR CATHETER) MEDICATIONS: 1% lidocaine local ANESTHESIA/SEDATION: Moderate Sedation Time: None. The patient's level of consciousness and vital signs were monitored continuously by radiology nursing throughout the procedure under my direct supervision. FLUOROSCOPY TIME:  Fluoroscopy Time: 0 minutes 24 seconds ( mGy). COMPLICATIONS: None immediate. PROCEDURE: Informed written consent was obtained from the patient's family after a thorough discussion of the procedural risks, benefits and alternatives. All questions were addressed. Maximal Sterile Barrier Technique was utilized  including caps, mask, sterile gowns, sterile gloves, sterile drape, hand hygiene and skin antiseptic. A timeout was performed prior to the initiation of the procedure. Under sterile conditions and local anesthesia, ultrasound micropuncture access performed of the right internal jugular vein. Images obtained for documentation of the patent right internal jugular vein. Guidewire advanced easily under fluoroscopy followed by the micro dilator set. Amplatz guidewire inserted followed by tract dilatation to insert a 16 cm Mahurkar temporary dialysis catheter. Tip SVC RA junction. Position confirmed with fluoroscopy. Images obtained for documentation. Blood aspirated easily followed by saline and heparin flushes. Appropriate volume and strength of heparin instilled in all 3 lumens followed by external caps. Catheter secured Prolene sutures and a sterile dressing. No immediate complication. Patient tolerated the procedure well. IMPRESSION: Successful ultrasound and fluoroscopic right IJ temporary dialysis catheter (16 cm Mahurkar catheter). Tip SVC RA junction. Ready for use. Electronically Signed   By: Jerilynn Mages.  Shick M.D.   On: 11/20/2019 15:33   IR US Guide Vasc Access Right  Result Date: 11/20/2019 INDICATION: ENCEPHALOPATHY, END-STAGE RENAL DISEASE, NO CURRENT ACCESS FOR DIALYSIS EXAM: ULTRASOUND GUIDANCE FOR VASCULAR ACCESS RIGHT IJ TEMPORARY DIALYSIS CATHETER (16 CM MAHURKAR CATHETER) MEDICATIONS: 1% lidocaine local ANESTHESIA/SEDATION: Moderate Sedation Time: None. The patient's level of consciousness and vital signs were monitored continuously by radiology nursing throughout the procedure under my direct supervision. FLUOROSCOPY TIME:  Fluoroscopy Time: 0 minutes 24 seconds ( mGy). COMPLICATIONS: None immediate. PROCEDURE: Informed written consent was obtained from the patient's family after a thorough discussion of the procedural risks, benefits and alternatives. All questions were addressed. Maximal Sterile Barrier  Technique was utilized including caps, mask, sterile gowns, sterile gloves, sterile drape, hand hygiene and skin antiseptic. A timeout was performed prior to the initiation of the procedure. Under sterile conditions and local anesthesia, ultrasound micropuncture access performed of the right internal jugular vein. Images obtained for documentation of the patent right internal jugular vein. Guidewire advanced easily under fluoroscopy followed by the micro dilator set. Amplatz guidewire inserted followed by tract dilatation to insert a 16 cm Mahurkar temporary dialysis catheter. Tip SVC RA junction. Position confirmed with fluoroscopy. Images obtained for documentation. Blood aspirated easily followed by saline and heparin flushes. Appropriate volume and strength of heparin instilled in all 3 lumens followed by external caps. Catheter secured Prolene sutures and a sterile dressing. No immediate complication. Patient tolerated the procedure well. IMPRESSION: Successful ultrasound and fluoroscopic right IJ temporary dialysis catheter (16 cm Mahurkar catheter). Tip SVC RA junction. Ready for use. Electronically Signed   By: Jerilynn Mages.  Shick M.D.   On: 11/20/2019 15:33   Scheduled Meds: . alteplase      . alteplase  4 mg Intracatheter Once  . aspirin EC  81 mg Oral Daily  . atorvastatin  10 mg Oral q1800  . calcitRIOL  0.25 mcg Oral Daily  . carvedilol  25 mg Oral BID WC  . Chlorhexidine Gluconate Cloth  6 each Topical Q0600  . citalopram  10 mg Oral Daily   And  . [START ON 11/24/2019] citalopram  20 mg Oral Daily  . darbepoetin (ARANESP) injection - DIALYSIS  40 mcg Intravenous Q Wed-HD  . feeding supplement (PRO-STAT SUGAR FREE 64)  30 mL Oral BID  .  furosemide  80 mg Oral Daily  . gabapentin  100 mg Oral QHS  . heparin  5,000 Units Subcutaneous Q8H  . hydrALAZINE  100 mg Oral Q8H  . insulin aspart  0-15 Units Subcutaneous TID WC  . insulin aspart  0-5 Units Subcutaneous QHS  . isosorbide mononitrate  15  mg Oral Daily  . lisinopril  40 mg Oral Daily  . multivitamin  1 tablet Oral QHS  . sevelamer carbonate  800 mg Oral TID WC  . sodium chloride flush  10-40 mL Intracatheter Q12H  . sodium chloride flush  3 mL Intravenous Q12H  . sodium chloride flush  3 mL Intravenous Q12H  . spironolactone  25 mg Oral Daily   Continuous Infusions: . sodium chloride Stopped (11/18/19 2357)  . sodium chloride       LOS: 3 days   Kerney Elbe, DO [facility] PAGER is on [place]  If 7PM-7AM, please contact night-coverage [email or web address]

## 2019-11-22 NOTE — Plan of Care (Signed)
  Problem: Coping: Goal: Level of anxiety will decrease Outcome: Not Progressing   

## 2019-11-22 NOTE — Plan of Care (Signed)
  Problem: Elimination: Goal: Will not experience complications related to bowel motility Outcome: Progressing   

## 2019-11-22 NOTE — Progress Notes (Signed)
Assumed care of patient when patient returned from HD. Patient is agitated refusing all medications. Patient refused blood glucose check. Provided paged to updated about patient's psychological state. Waiting on order.  Patient is on a low bed, with bilateral floor mats at each side of bed, bed alarm on, will continue to re-orient patient.

## 2019-11-22 NOTE — Consult Note (Signed)
  Messaged patients' nurse Janna Arch, RN) to set up psychiatry consult.  Patient current out for dialysis.  Girtha Hake, RN will call when patient returns to set up consult

## 2019-11-22 NOTE — Progress Notes (Signed)
Pt is confused only oriented to self. Pt is constantly trying to get out bed. Pt has tried to remove HD cathter along with IV. Pt doesn't follow any commands. Pt is constantly screaming  and stating "I don't want to burn". PT is also confusing staff members with family members. Mittens have been applied to prevent patient from removing lines, but pt bites them off. Pt has also been placed in a low bed, safety matts, tele- sitter. MD notified and ordered 2Mg  of ativan ; which was administered. Daughter at bedside and updated. Will continue to monitor.

## 2019-11-23 ENCOUNTER — Inpatient Hospital Stay (HOSPITAL_COMMUNITY): Payer: Medicare HMO

## 2019-11-23 DIAGNOSIS — N186 End stage renal disease: Secondary | ICD-10-CM | POA: Diagnosis not present

## 2019-11-23 DIAGNOSIS — N2581 Secondary hyperparathyroidism of renal origin: Secondary | ICD-10-CM | POA: Diagnosis not present

## 2019-11-23 DIAGNOSIS — Z992 Dependence on renal dialysis: Secondary | ICD-10-CM | POA: Diagnosis not present

## 2019-11-23 HISTORY — PX: IR FLUORO GUIDE CV LINE RIGHT: IMG2283

## 2019-11-23 LAB — COMPREHENSIVE METABOLIC PANEL
ALT: 12 U/L (ref 0–44)
AST: 20 U/L (ref 15–41)
Albumin: 1.9 g/dL — ABNORMAL LOW (ref 3.5–5.0)
Alkaline Phosphatase: 41 U/L (ref 38–126)
Anion gap: 11 (ref 5–15)
BUN: 20 mg/dL (ref 8–23)
CO2: 26 mmol/L (ref 22–32)
Calcium: 8.4 mg/dL — ABNORMAL LOW (ref 8.9–10.3)
Chloride: 102 mmol/L (ref 98–111)
Creatinine, Ser: 4.19 mg/dL — ABNORMAL HIGH (ref 0.44–1.00)
GFR calc Af Amer: 12 mL/min — ABNORMAL LOW (ref >60)
GFR calc non Af Amer: 11 mL/min — ABNORMAL LOW (ref >60)
Glucose, Bld: 93 mg/dL (ref 70–99)
Potassium: 3.8 mmol/L (ref 3.5–5.1)
Sodium: 139 mmol/L (ref 135–145)
Total Bilirubin: 0.7 mg/dL (ref 0.3–1.2)
Total Protein: 5.7 g/dL — ABNORMAL LOW (ref 6.5–8.1)

## 2019-11-23 LAB — CULTURE, BLOOD (ROUTINE X 2)
Culture: NO GROWTH
Culture: NO GROWTH
Special Requests: ADEQUATE

## 2019-11-23 LAB — CBC WITH DIFFERENTIAL/PLATELET
Abs Immature Granulocytes: 0.26 10*3/uL — ABNORMAL HIGH (ref 0.00–0.07)
Basophils Absolute: 0.1 10*3/uL (ref 0.0–0.1)
Basophils Relative: 1 %
Eosinophils Absolute: 0.3 10*3/uL (ref 0.0–0.5)
Eosinophils Relative: 2 %
HCT: 24.2 % — ABNORMAL LOW (ref 36.0–46.0)
Hemoglobin: 7.6 g/dL — ABNORMAL LOW (ref 12.0–15.0)
Immature Granulocytes: 2 %
Lymphocytes Relative: 26 %
Lymphs Abs: 2.9 10*3/uL (ref 0.7–4.0)
MCH: 31 pg (ref 26.0–34.0)
MCHC: 31.4 g/dL (ref 30.0–36.0)
MCV: 98.8 fL (ref 80.0–100.0)
Monocytes Absolute: 0.8 10*3/uL (ref 0.1–1.0)
Monocytes Relative: 8 %
Neutro Abs: 6.7 10*3/uL (ref 1.7–7.7)
Neutrophils Relative %: 61 %
Platelets: 163 10*3/uL (ref 150–400)
RBC: 2.45 MIL/uL — ABNORMAL LOW (ref 3.87–5.11)
RDW: 14.4 % (ref 11.5–15.5)
WBC: 11 10*3/uL — ABNORMAL HIGH (ref 4.0–10.5)
nRBC: 0 % (ref 0.0–0.2)

## 2019-11-23 LAB — GLUCOSE, CAPILLARY
Glucose-Capillary: 81 mg/dL (ref 70–99)
Glucose-Capillary: 88 mg/dL (ref 70–99)
Glucose-Capillary: 85 mg/dL (ref 70–99)
Glucose-Capillary: 87 mg/dL (ref 70–99)
Glucose-Capillary: 210 mg/dL — ABNORMAL HIGH (ref 70–99)

## 2019-11-23 LAB — PHOSPHORUS: Phosphorus: 4.2 mg/dL (ref 2.5–4.6)

## 2019-11-23 LAB — MAGNESIUM: Magnesium: 2.1 mg/dL (ref 1.7–2.4)

## 2019-11-23 MED ORDER — HEPARIN SODIUM (PORCINE) 1000 UNIT/ML IJ SOLN
INTRAMUSCULAR | Status: AC
  Filled 2019-11-23: qty 1

## 2019-11-23 MED ORDER — CHLORHEXIDINE GLUCONATE 4 % EX LIQD
CUTANEOUS | Status: AC
  Filled 2019-11-23: qty 15

## 2019-11-23 MED ORDER — CEFAZOLIN SODIUM-DEXTROSE 2-4 GM/100ML-% IV SOLN
INTRAVENOUS | Status: AC
  Administered 2019-11-23: 2000 mg
  Filled 2019-11-23: qty 100

## 2019-11-23 MED ORDER — LIDOCAINE HCL 1 % IJ SOLN
INTRAMUSCULAR | Status: AC
  Filled 2019-11-23: qty 20

## 2019-11-23 MED ORDER — MIDAZOLAM HCL 2 MG/2ML IJ SOLN
INTRAMUSCULAR | Status: AC
  Filled 2019-11-23: qty 2

## 2019-11-23 MED ORDER — LIDOCAINE HCL 1 % IJ SOLN
INTRAMUSCULAR | Status: AC | PRN
  Administered 2019-11-23: 15 mL

## 2019-11-23 MED ORDER — MIDAZOLAM HCL 2 MG/2ML IJ SOLN
INTRAMUSCULAR | Status: AC | PRN
  Administered 2019-11-23: 1 mg via INTRAVENOUS

## 2019-11-23 MED ORDER — CEFAZOLIN SODIUM-DEXTROSE 1-4 GM/50ML-% IV SOLN
1.0000 g | INTRAVENOUS | Status: AC
  Filled 2019-11-23: qty 50

## 2019-11-23 MED ORDER — FENTANYL CITRATE (PF) 100 MCG/2ML IJ SOLN
INTRAMUSCULAR | Status: AC | PRN
  Administered 2019-11-23: 50 ug via INTRAVENOUS

## 2019-11-23 MED ORDER — HEPARIN SODIUM (PORCINE) 1000 UNIT/ML IJ SOLN
INTRAMUSCULAR | Status: AC | PRN
  Administered 2019-11-23: 3200 [IU] via INTRAVENOUS

## 2019-11-23 MED ORDER — FENTANYL CITRATE (PF) 100 MCG/2ML IJ SOLN
INTRAMUSCULAR | Status: AC
  Filled 2019-11-23: qty 2

## 2019-11-23 MED ORDER — GELATIN ABSORBABLE 12-7 MM EX MISC
CUTANEOUS | Status: AC
  Filled 2019-11-23: qty 1

## 2019-11-23 MED ORDER — CHLORHEXIDINE GLUCONATE CLOTH 2 % EX PADS: 6.0000 | MEDICATED_PAD | Freq: Every day | CUTANEOUS | Status: DC

## 2019-11-23 MED ORDER — GELATIN ABSORBABLE 12-7 MM EX MISC
CUTANEOUS | Status: AC | PRN
  Administered 2019-11-23: 1 via TOPICAL

## 2019-11-23 NOTE — Consult Note (Signed)
   [facility] CM Inpatient Consult   11/23/2019  Patricia [name] 1956/07/24 [id number]    Patient was screened for 49% extreme high risk score for unplanned readmission with 3 hospitalizations and 2 ED visits in the past 6 months; as well as to check for [facility] care management needs as a benefit from her [facility] insurance plan.  Per MD brief narrative, patient is a 64 y.o.femalewith medical history significant ofend-stage renal disease, diabetes mellitus type 2, hypertension, chronic anemia, congestive heart failure, depression, recent history of diverticulitis on antibiotics, history of periumbilical tachypnea,  presented to [facility] ED forchange in mental status. (Acute Encephalopathy with significant agitation and confusion, Major depressive disorder).  Chartbriefreview reveals PT evaluation completed and is being recommended for possible skilled nursing facility (SNF) stay.  Primary care provider is: Ladell Pier, MD with [facility].  Plan: Will follow for progress and disposition.  If transitioning to skilled nursing facility, no [facility] Care Management follow-up needed aspatient's care will be met at the skilled level of care.  Please refer to [facility] care management for appropriate follow-up if there are changes in disposition.   Of note, [facility] Care Management services does not replace or interfere with any services that are arranged by transition of care case management or social work.    For questions,and referral,please call:  Edwena Felty A. Chiyeko Ferre, BSN, RN-BC [facility] Liaison Cell: [phone]

## 2019-11-23 NOTE — Consult Note (Addendum)
NP attempted due to teleassessment however was reported by RN Cardell Peach patient is" too drowsy/sedated" to participate in assessment. Was reported patient received Ativan last night.  NP will attempt to follow-up around 1130  15:46: NP spoke to RN Ria Comment who reported patient is off the unit due to IR procedure.  Patient to be reassessed on 11/24/2019

## 2019-11-23 NOTE — Progress Notes (Signed)
PROGRESS NOTE    Patricia [name]  LKT:[id number] DOB: Aug 26, 1956 DOA: 11/18/2019 PCP: Ladell Pier, MD  Brief Narrative:  The Patricia [name] Patricia [name] a 64 y.o.femalewith medical history significant ofend-stage renal disease, diabetes mellitus type 2, hypertension, chronic anemia, congestive heart failure, depression, recent history of diverticulitis on antibiotics, history of periumbilical tachypnea presented to [facility] ED for change in mental status.  CT head was negative.  Patricia [name] uses PT for dialysis but Patricia [name] had not had PT done for 3 days.  She was subsequently admitted to [facility] under hospital service.    Nephrology was consulted and she has been getting hemodialysis here.  Patricia [name] had remained severely depressed and Often would not even make eye contact and Does not answer questions yesterday.  Psychiatry consulted and she was started on Celexa.    Yesterday She was much more awake but was very confused and agitated and try to climb out of bed and screaming.  She was given IV lorazepam and taken for dialysis; She needed a tunneled dialysis catheter and needed to haver her TDC replaced as she is being converted to hemodialysis.  Because she remained encephalopathic and severely agitated psychiatry was reconsulted and they are still pending to see the Patricia [name] again but unfortunately the Patricia [name] was too drowsy this AM as she received Ativan last night.   Today she had conversion on the right IJ temp cath for tunneled approach HD catheter by interventional radiology.  Psychiatry is still to reassess the Patricia [name] as she has been severely agitated yesterday and somnolent today due to her receiving Ativan last night.  Assessment & Plan:   Principal Problem:   Major depressive disorder, recurrent severe without psychotic features ([place]) Active Problems:   Dyslipidemia   Hypertension associated with diabetes ([place])   Anxiety and depression   CKD (chronic kidney disease) stage 5, GFR  less than 15 ml/min (HCC)   Anemia due to stage 5 chronic kidney disease, not on chronic dialysis ([place])   Type II diabetes mellitus with renal manifestations (HCC)   Chronic combined systolic and diastolic CHF (congestive heart failure) (HCC)   Hypokalemia   Periumbilical hernia   ESRD on peritoneal dialysis ([place])   Sigmoid diverticulitis   Acute encephalopathy   Generalized weakness  Acute Encephalopathy with significant agitation and confusion -Initially would not answer questions and likely had severe depression so psychiatry was consulted and they started her on Celexa 10 mg p.o. daily for 3 days followed by 20 mg p.o. daily however yesterday she was significantly agitated and combative trying to climb out of bed and had AMS -Today she is somnolent and drowsy and calmer  -Recent head CT done showed no acute intracranial abnormality -Yesterday Given IV lorazepam 2 mg for her agitation during the day and unfortunately cannot use Haldol given her prolonged QTC; Received Lorazepam 1 mg last evening as well -Psychiatry was consulted again for her significant behavioral disturbances and agitation -If continues to have significant disturbances may warrant an MRI and EEG but will allow for Ativan to wear off   End-stage renal disease now on hemodialysis -She was on peritoneal dialysis but did not do her PD starting Thursday.   -Nephrology consulted.   -There was already a plan for her to switch to hemodialysis.  She received right IJ temporary HD catheter by IR and now will need a tunneled dialysis catheter.  She is getting dialysis here and was dialyzed today -Patricia [name]'s BUN/creatinine went from 52/7.23 -> 28/4.54 ->  20/4.19 -Avoid nephrotoxic medications, contrast dyes, hypotension and renally dose medications -Repeat CMP in a.m.  Hypokalemia -Mild as potassium was 3.4 and is now 3.8 -Continue monitor and replete as necessary  -Repeat CMP in a.m.  Recent history of diverticulitis -  Discontinued her antibiotics of Augmentin Cipro.  Unknown whether she was taking at home or not.   -She was not supposed to continue this at home.  Essential hypertension -Blood pressure remains elevated mostly because for some reason, her medications have been skipped and now she was significantly agitated.   -She is on 4 different medications and it makes me challenging to add any further medications unless she gets all her medications as a scheduled for at least 24 hours.   -She is on as needed hydralazine. -BP this AM was 767/34  Chronic diastolic congestive heart failure: -Currently appears euvolemic and was dialyzed yesterday  -Will hold off on Lasix now that volume management will be per Dialysis   CAD -No symptoms. Continue current medications if able  Type 2 diabetes mellitus -Blood sugar control.   -Start on SSI.  Last hemoglobin A1c 7.0.   -Takes Lantus only 6 units at home which is on hold here. -CBG's range from 77-88  Leukocytosis -Was mild mild with no signs of infection and she is afebrile. -WBC went from 10.4 -> 10.7 -> 11.0 -Blood Cx x2 showed NGTD at 5 Days  -CXR was Negative and showed no active Cardiopulmonary Disease  Acute on chronic anemia of chronic disease -Baseline hemoglobin between 8 and 9.  Now Hb/Hct is 7.6/24.2 -Transfuse if hemoglobin less than 7. -Today to monitor for signs and symptoms of bleeding; currently no overt bleeding or -Repeat CBC in a.m.  DVT prophylaxis: Heparin 5,000 units sq q8h Code Status: FULL CODE  Family Communication: No family present at bedside  Disposition Plan: SNF when Nephrology workup is complete and Patricia [name] is back to baseline and no longer encephlopathic and confused and combative    Consultants:   Nephrology  Interventional Radiology   Procedures:  Hemodialysis    Antimicrobials:  Anti-infectives (From admission, onward)   Start     Dose/Rate Route Frequency Ordered Stop   11/23/19 1403   ceFAZolin (ANCEF) 2-4 GM/100ML-% IVPB    Note to Pharmacy: [place]   : cabinet override      11/23/19 1403 11/23/19 1416   11/23/19 1100  ceFAZolin (ANCEF) IVPB 1 g/50 mL premix     1 g 100 mL/hr over 30 Minutes Intravenous To Surgery 11/23/19 1056 11/24/19 1100   11/19/19 1000  amoxicillin-clavulanate (AUGMENTIN) 875-125 MG per tablet 1 tablet  Status:  Discontinued     1 tablet Oral Every 12 hours 11/19/19 0555 11/19/19 0749   11/19/19 1000  metroNIDAZOLE (FLAGYL) tablet 500 mg  Status:  Discontinued     500 mg Oral Every 12 hours 11/19/19 0555 11/19/19 0749     Subjective: Seen and examined at bedside but today she was calmer but a lot more drowsy today.  She will wake up and answer questions but falls back to sleep and she received Ativan last night.  No nursing concerns currently and she is going for her temporary dialysis catheter exchange.  Objective: Vitals:   11/23/19 1430 11/23/19 1435 11/23/19 1445 11/23/19 1455  BP: (!) 175/79 (!) 150/76 (!) 143/70 136/70  Pulse: 96 97 (!) 102 94  Resp: 19 16  15   Temp:      TempSrc:      SpO2:  100% 100% 100% 100%  Weight:      Height:        Intake/Output Summary (Last 24 hours) at 11/23/2019 1637 Last data filed at 11/23/2019 1500 Gross per 24 hour  Intake 60 ml  Output 51 ml  Net 9 ml   Filed Weights   11/21/19 0725 11/21/19 1030 11/22/19 2046  Weight: 65.4 kg 65.2 kg 65.2 kg   Examination: Physical Exam:  Constitutional: WN/WD African-American female currently who is somnolent and drowsy but is calmer than yesterday Eyes: Lids and conjunctivae normal, sclerae anicteric  ENMT: External Ears, Nose appear normal. Grossly normal hearing. Neck: Appears normal, supple, no cervical masses, normal ROM, no appreciable thyromegaly; no JVD Respiratory: Slightly diminished to auscultation bilaterally, no wheezing, rales, rhonchi or crackles. Normal respiratory effort and Patricia [name] is not tachypenic. No accessory muscle use.   Unlabored breathing Cardiovascular: RRR, no murmurs / rubs / gallops. S1 and S2 auscultated. No extremity edema.  Abdomen: Soft, non-tender, non-distended. Bowel sounds positive.  GU: Deferred. Musculoskeletal: No clubbing / cyanosis of digits/nails. No joint deformity upper and lower extremities  Skin: No rashes, lesions, ulcers on limited skin evaluation. No induration; Warm and dry.  Neurologic: She is somnolent and drowsy and opens her eyes and shakes her head yes and now Psychiatric: Normal judgment and insight.  Somnolent and drowsy. Normal mood and appropriate affect.   Data Reviewed: I have personally reviewed following labs and imaging studies  CBC: Recent Labs  Lab 11/18/19 1734 11/18/19 1734 11/18/19 2001 11/20/19 0451 11/21/19 0410 11/22/19 1253 11/23/19 0400  WBC 15.8*  --   --  11.6* 10.4 10.7* 11.0*  NEUTROABS 12.6*  --   --  8.9* 6.6  --  6.7  HGB 8.4*   < > 8.2* 7.6* 7.8* 7.9* 7.6*  HCT 24.7*   < > 24.0* 23.2* 24.0* 25.0* 24.2*  MCV 91.8  --   --  92.8 94.1 96.9 98.8  PLT 159  --   --  165 194 198 163   < > = values in this interval not displayed.   Basic Metabolic Panel: Recent Labs  Lab 11/18/19 1734 11/18/19 2001 11/19/19 0609 11/19/19 0609 11/19/19 1208 11/20/19 0451 11/21/19 0410 11/22/19 1345 11/23/19 0400  NA 132*   < > 135  --   --  137 140 136 139  K 2.0*   < > 2.6*   < > 2.9* 3.8 3.8 3.4* 3.8  CL 93*   < > 99  --   --  104 103 100 102  CO2 27   < > 23  --   --  22 23 25 26   GLUCOSE 218*   < > 151*  --   --  169* 91 99 93  BUN 45*   < > 42*  --   --  46* 52* 28* 20  CREATININE 6.29*   < > 6.34*  --   --  6.45* 7.33* 4.54* 4.19*  CALCIUM 8.5*   < > 8.4*  --   --  8.3* 8.5* 8.2* 8.4*  MG 1.9  --   --   --   --  2.6* 2.7* 2.2 2.1  PHOS  --   --   --   --   --  4.4 4.5 3.5 4.2   < > = values in this interval not displayed.   GFR: Estimated Creatinine Clearance: 12.2 mL/min (A) (by C-G formula based on SCr of 4.19 mg/dL (H)). Liver Function  Tests: Recent Labs  Lab 11/18/19 1734 11/20/19 0451 11/21/19 0410 11/22/19 1345 11/23/19 0400  AST 17 13* 14* 20 20  ALT 14 12 12 12 12   ALKPHOS 67 43 44 37* 41  BILITOT 0.5 0.4 0.6 0.9 0.7  PROT 6.1* 5.3* 5.6* 5.1* 5.7*  ALBUMIN 2.3* 1.8* 1.9* 1.9* 1.9*   No results for input(s): LIPASE, AMYLASE in the last 168 hours. Recent Labs  Lab 11/18/19 1734  AMMONIA 22   Coagulation Profile: Recent Labs  Lab 11/18/19 1734  INR 1.2   Cardiac Enzymes: No results for input(s): CKTOTAL, CKMB, CKMBINDEX, TROPONINI in the last 168 hours. BNP (last 3 results) No results for input(s): PROBNP in the last 8760 hours. HbA1C: No results for input(s): HGBA1C in the last 72 hours. CBG: Recent Labs  Lab 11/22/19 1130 11/22/19 2115 11/23/19 0132 11/23/19 0649 11/23/19 1111  GLUCAP 92 77 81 88 85   Lipid Profile: No results for input(s): CHOL, HDL, LDLCALC, TRIG, CHOLHDL, LDLDIRECT in the last 72 hours. Thyroid Function Tests: No results for input(s): TSH, T4TOTAL, FREET4, T3FREE, THYROIDAB in the last 72 hours. Anemia Panel: No results for input(s): VITAMINB12, FOLATE, FERRITIN, TIBC, IRON, RETICCTPCT in the last 72 hours. Sepsis Labs: Recent Labs  Lab 11/18/19 1956  LATICACIDVEN 0.7    Recent Results (from the past 240 hour(s))  SARS CORONAVIRUS 2 (TAT 6-24 HRS) Nasopharyngeal Nasopharyngeal Swab     Status: None   Collection Time: 11/18/19  7:00 PM   Specimen: Nasopharyngeal Swab  Result Value Ref Range Status   SARS Coronavirus 2 NEGATIVE NEGATIVE Final    Comment: (NOTE) SARS-CoV-2 target nucleic acids are NOT DETECTED. The SARS-CoV-2 RNA is generally detectable in upper and lower respiratory specimens during the acute phase of infection. Negative results do not preclude SARS-CoV-2 infection, do not rule out co-infections with other pathogens, and should not be used as the sole basis for treatment or other Patricia [name] management decisions. Negative results must be  combined with clinical observations, Patricia [name] history, and epidemiological information. The expected result is Negative. Fact Sheet for Patients: SugarRoll.be Fact Sheet for Healthcare Providers: https://www.woods-mathews.com/ This test is not yet approved or cleared by the [place] FDA and  has been authorized for detection and/or diagnosis of SARS-CoV-2 by FDA under an Emergency Use Authorization (EUA). This EUA will remain  in effect (meaning this test can be used) for the duration of the COVID-19 declaration under Section 56 4(b)(1) of the Act, 21 U.S.C. section 360bbb-3(b)(1), unless the authorization is terminated or revoked sooner. Performed at [facility], [place] [address], [place], [place] [address]   Blood culture (routine x 2)     Status: None   Collection Time: 11/18/19  7:56 PM   Specimen: BLOOD RIGHT HAND  Result Value Ref Range Status   Specimen Description BLOOD RIGHT HAND  Final   Special Requests   Final    BOTTLES DRAWN AEROBIC AND ANAEROBIC Blood Culture results may not be optimal due to an inadequate volume of blood received in culture bottles Performed at [facility], [address] [place], [place] [address]    Culture   Final    NO GROWTH 5 DAYS Performed at [facility], [place] [address] [place], [place] [address]    Report Status 11/23/2019 FINAL  Final  Blood culture (routine x 2)     Status: None   Collection Time: 11/18/19  8:05 PM   Specimen: BLOOD LEFT HAND  Result Value Ref Range Status   Specimen Description BLOOD LEFT HAND  Final   Special Requests   Final    BOTTLES DRAWN AEROBIC AND ANAEROBIC Blood Culture adequate volume Performed at [facility], [address] [place], [place] [address]    Culture   Final    NO GROWTH 5 DAYS Performed at [facility], [place] [address] [place], [place] [address]    Report Status 11/23/2019 FINAL  Final  SARS  Coronavirus 2 by RT PCR (hospital order, performed in [facility] hospital lab) Nasopharyngeal Nasopharyngeal Swab     Status: None   Collection Time: 11/18/19  9:50 PM   Specimen: Nasopharyngeal Swab  Result Value Ref Range Status   SARS Coronavirus 2 NEGATIVE NEGATIVE Final    Comment: Performed at [facility], [address] [place], [place] [address]     RN Pressure Injury Documentation:     Estimated body mass index is 24.67 kg/m as calculated from the following:   Height as of this encounter: 5\' 4"  (1.626 m).   Weight as of this encounter: 65.2 kg.  Malnutrition Type:      Malnutrition Characteristics:      Nutrition Interventions:       Radiology Studies: No results found. Scheduled Meds: . aspirin EC  81 mg Oral Daily  . atorvastatin  10 mg Oral q1800  . calcitRIOL  0.25 mcg Oral Daily  . carvedilol  25 mg Oral BID WC  . chlorhexidine      . [START ON 11/24/2019] Chlorhexidine Gluconate Cloth  6 each Topical Q0600  . citalopram  10 mg Oral Daily   And  . [START ON 11/24/2019] citalopram  20 mg Oral Daily  . darbepoetin (ARANESP) injection - DIALYSIS  40 mcg Intravenous Q Wed-HD  . feeding supplement (PRO-STAT SUGAR FREE 64)  30 mL Oral BID  . fentaNYL      . gabapentin  100 mg Oral QHS  . gelatin adsorbable      . heparin      . heparin  5,000 Units Subcutaneous Q8H  . hydrALAZINE  100 mg Oral Q8H  . insulin aspart  0-15 Units Subcutaneous TID WC  . insulin aspart  0-5 Units Subcutaneous QHS  . isosorbide mononitrate  15 mg Oral Daily  . lidocaine      . lisinopril  40 mg Oral Daily  . midazolam      . multivitamin  1 tablet Oral QHS  . sevelamer carbonate  800 mg Oral TID WC  . sodium chloride flush  10-40 mL Intracatheter Q12H  . sodium chloride flush  3 mL Intravenous Q12H  . sodium chloride flush  3 mL Intravenous Q12H  . spironolactone  25 mg Oral Daily   Continuous Infusions: . sodium chloride Stopped (11/18/19 2357)  . sodium  chloride    .  ceFAZolin (ANCEF) IV       LOS: 4 days   Kerney Elbe, DO [facility] PAGER is on [place]  If 7PM-7AM, please contact night-coverage [email or web address]

## 2019-11-23 NOTE — Procedures (Signed)
Interventional Radiology Procedure Note  Procedure: Conversion of right IJ temp HD catheter, for tunneled approach HD catheter  Tip is positioned at the superior cavoatrial junction and catheter is ready for immediate use.  Complications: None Recommendations:  - Ok to use - Do not submerge - Routine line care   Signed,  Dulcy Fanny. Earleen Newport, DO

## 2019-11-23 NOTE — Progress Notes (Signed)
Chief Complaint: Patient was seen today for HD catheter  Referring Physician(s): Dr. Jonnie Finner  Supervising Physician: Corrie Mckusick  Patient Status: [facility] - In-pt  Subjective: S/p (R)IJ trialysis catheter placed 2/8. At that time had elevated WBC and pending blood cultures. Blood cultures now negative and WBC trending back down. IR now asked to convert to tunneled HD cath. Discussed with daughter  Objective: Physical Exam: BP (!) 169/81 (BP Location: Left Arm)   Pulse 92   Temp 99.2 F (37.3 C) (Oral)   Resp 17   Ht 5\' 4"  (1.626 m)   Wt 65.2 kg   SpO2 98%   BMI 24.67 kg/m  Pt seen in room, still with confusion/AMS Discussed with daughter (R)IJ Trialysis intact, site clean   Current Facility-Administered Medications:  .  0.9 %  sodium chloride infusion, , Intravenous, PRN, Malvin Johns, MD, Stopped at 11/18/19 2357 .  0.9 %  sodium chloride infusion, 250 mL, Intravenous, PRN, Cristescu, Mircea G, MD .  acetaminophen (TYLENOL) tablet 650 mg, 650 mg, Oral, Q6H PRN, Cristescu, Mircea G, MD .  albuterol (PROVENTIL) (2.5 MG/3ML) 0.083% nebulizer solution 2.5 mg, 2.5 mg, Inhalation, Q6H PRN, Cristescu, Mircea G, MD .  aspirin EC tablet 81 mg, 81 mg, Oral, Daily, Cristescu, Mircea G, MD, 81 mg at 11/21/19 1132 .  atorvastatin (LIPITOR) tablet 10 mg, 10 mg, Oral, q1800, Cristescu, Mircea G, MD, 10 mg at 11/22/19 2047 .  calcitRIOL (ROCALTROL) capsule 0.25 mcg, 0.25 mcg, Oral, Daily, Cristescu, Mircea G, MD, 0.25 mcg at 11/21/19 1132 .  carvedilol (COREG) tablet 25 mg, 25 mg, Oral, BID WC, Cristescu, Mircea G, MD, 25 mg at 11/21/19 1716 .  Chlorhexidine Gluconate Cloth 2 % PADS 6 each, 6 each, Topical, Q0600, Penninger, Lindsay, [place], 6 each at 11/22/19 0602 .  citalopram (CELEXA) tablet 10 mg, 10 mg, Oral, Daily, 10 mg at 11/21/19 1320 **AND** [START ON 11/24/2019] citalopram (CELEXA) tablet 20 mg, 20 mg, Oral, Daily, Pahwani, Ravi, MD .  Darbepoetin Alfa (ARANESP) injection 40  mcg, 40 mcg, Intravenous, Q Wed-HD, Ernest Haber, PA-C, 40 mcg at 11/22/19 1618 .  feeding supplement (PRO-STAT SUGAR FREE 64) liquid 30 mL, 30 mL, Oral, BID, Cristescu, Mircea G, MD, 30 mL at 11/21/19 2140 .  furosemide (LASIX) tablet 80 mg, 80 mg, Oral, Daily, Cristescu, Mircea G, MD, 80 mg at 11/21/19 1133 .  gabapentin (NEURONTIN) capsule 100 mg, 100 mg, Oral, QHS, Cristescu, Mircea G, MD, 100 mg at 11/22/19 2048 .  haloperidol lactate (HALDOL) injection 2 mg, 2 mg, Intravenous, Q6H PRN, Sheikh, Omair Latif, DO .  heparin injection 5,000 Units, 5,000 Units, Subcutaneous, Q8H, Cristescu, Mircea G, MD, 5,000 Units at 11/23/19 0625 .  hydrALAZINE (APRESOLINE) injection 10 mg, 10 mg, Intravenous, Q4H PRN, Cristescu, Mircea G, MD, 10 mg at 11/21/19 0514 .  hydrALAZINE (APRESOLINE) tablet 100 mg, 100 mg, Oral, Q8H, Pahwani, Ravi, MD, 100 mg at 11/22/19 2048 .  insulin aspart (novoLOG) injection 0-15 Units, 0-15 Units, Subcutaneous, TID WC, Darliss Cheney, MD, 1 Units at 11/20/19 1308 .  insulin aspart (novoLOG) injection 0-5 Units, 0-5 Units, Subcutaneous, QHS, Pahwani, Ravi, MD .  isosorbide mononitrate (IMDUR) 24 hr tablet 15 mg, 15 mg, Oral, Daily, Cristescu, Mircea G, MD, 15 mg at 11/21/19 1133 .  labetalol (NORMODYNE) injection 10 mg, 10 mg, Intravenous, Q4H PRN, Darliss Cheney, MD, 10 mg at 11/21/19 0442 .  lidocaine (PF) (XYLOCAINE) 1 % injection, , , PRN, Greggory Keen, MD, 5 mL at 11/20/19  1525 .  lisinopril (ZESTRIL) tablet 40 mg, 40 mg, Oral, Daily, Cristescu, Mircea G, MD, 40 mg at 11/21/19 1132 .  multivitamin (RENA-VIT) tablet 1 tablet, 1 tablet, Oral, QHS, Ernest Haber, PA-C, 1 tablet at 11/22/19 2047 .  ondansetron (ZOFRAN) tablet 4 mg, 4 mg, Oral, Q6H PRN **OR** ondansetron (ZOFRAN) injection 4 mg, 4 mg, Intravenous, Q6H PRN, Cristescu, Mircea G, MD .  oxyCODONE (Oxy IR/ROXICODONE) immediate release tablet 5 mg, 5 mg, Oral, Q4H PRN, Geiple, Joshua, PA-C, 5 mg at 11/18/19 2345 .   sevelamer carbonate (RENVELA) tablet 800 mg, 800 mg, Oral, TID WC, Cristescu, Mircea G, MD, 800 mg at 11/21/19 1716 .  sodium chloride flush (NS) 0.9 % injection 10-40 mL, 10-40 mL, Intracatheter, Q12H, Sheikh, Omair Latif, DO .  sodium chloride flush (NS) 0.9 % injection 10-40 mL, 10-40 mL, Intracatheter, PRN, Sheikh, Omair Latif, DO .  sodium chloride flush (NS) 0.9 % injection 3 mL, 3 mL, Intravenous, Q12H, Cristescu, Mircea G, MD, 3 mL at 11/19/19 2300 .  sodium chloride flush (NS) 0.9 % injection 3 mL, 3 mL, Intravenous, Q12H, Cristescu, Mircea G, MD, 3 mL at 11/22/19 2124 .  sodium chloride flush (NS) 0.9 % injection 3 mL, 3 mL, Intravenous, PRN, Cristescu, Mircea G, MD .  spironolactone (ALDACTONE) tablet 25 mg, 25 mg, Oral, Daily, Cristescu, Mircea G, MD, 25 mg at 11/21/19 1133  Labs: CBC Recent Labs    11/22/19 1253 11/23/19 0400  WBC 10.7* 11.0*  HGB 7.9* 7.6*  HCT 25.0* 24.2*  PLT 198 163   BMET Recent Labs    11/22/19 1345 11/23/19 0400  NA 136 139  K 3.4* 3.8  CL 100 102  CO2 25 26  GLUCOSE 99 93  BUN 28* 20  CREATININE 4.54* 4.19*  CALCIUM 8.2* 8.4*   LFT Recent Labs    11/23/19 0400  PROT 5.7*  ALBUMIN 1.9*  AST 20  ALT 12  ALKPHOS 41  BILITOT 0.7   PT/INR No results for input(s): LABPROT, INR in the last 72 hours.   Studies/Results: No results found.  Assessment/Plan: ESRD For conversion of temp to tunneled HD cath Bld cx negative, WBC down to 11, Afebrile Risks and benefits of image guided tunneled HD cath placement was discussed with the patient's daughter including, but not limited to bleeding, infection, pneumothorax, or fibrin sheath development and need for additional procedures.  All questions were answered, patient is agreeable to proceed. Consent signed and in chart.    LOS: 4 days   I spent a total of 20 minutes in face to face in clinical consultation, greater than 50% of which was counseling/coordinating care for HD cath  placement  Ascencion Dike PA-C 11/23/2019 10:40 AM

## 2019-11-23 NOTE — Progress Notes (Addendum)
Subjective:   2nd HD in hosp yest  Via Temp cath  After cath flow // Seen in room , drowsy / eyes closed  Follows commands / HD   Objective Vital signs in last 24 hours: Vitals:   11/22/19 2046 11/23/19 0118 11/23/19 0629 11/23/19 0917  BP: (!) 186/78 124/61 (!) 168/75 (!) 169/81  Pulse: 95 92 100 92  Resp: 17  16 17   Temp: 98 F (36.7 C)  99.2 F (37.3 C)   TempSrc: Oral  Oral   SpO2: 99%  99% 98%  Weight: 65.2 kg     Height:       Weight change: -0.2 kg  Physical Exam: General: ON HD  NAD, chronically ill appearing female , no eye contact or voiced response to questions  Lungs: CTA bilaterally. No wheeze, rales or rhonchi. Breathing is unlabored. Heart:RRR. No murmur, rubs or gallops.  Abdomen: soft, nontender, +BS, no guarding, no rebound tenderness, Pd cath present Right lower abd Dressing clean / dry non tender  Lower extremities:no pedal  edema Neuro:not answering questions or opening eyes to request// follow commands to squeeze hands  andMove lower extrem. .  Dialysis Access: R IJ TEMP Cath Patent on HD /PD cath in RLQ  OPDialysis Orders: CCPD 7xwk, EDW 68.5kg 5 exchanges Fill Vol 2.4L, Dwell time 1.5hr No last fill/day exchange  Problem/Plan:: 1. AMS - CT head negative. UA not indicative of infection.BC No growth 3 days / Possibly related to uremia but BUN/SCr is close to baseline. admit consulting Psy. / Celexa . 2. Hypokalemia - K  3.8 resolved after K supplemented. / using 4k bath on hd  3. ESRD- PD > transitioning to HD this week  / obtain  tunneled perm cath (no current indication infection ) > will re-consult IR for PERM cath today Last PD last  Thursday 11/16/19 ./ Clip to OP center [facility] / Noted Dr Joelyn Oms  CANCELED  Transfer to [facility]  for  Abdominal Surgery with Dr Raul Del concerning her PD cath  4. Hypertension/volume- BP down some with pd yest  ,attempt 2l uf tomor not vol overload by exam .  continue home med 5. Anemiaof  CKD- Hgb 8.4.admit > 7.4> 7.8 last mircera given on 12/15. Order aranesp 100 mcg with HD. 6. Secondary Hyperparathyroidism -Ca at goal.Phos 4.2Renvela binders not on  VDRA 7. Nutrition-.Alb 1.9 (ho PD contributing to some decr   Alb) renal diet  renal vit alb 1.9  nepro supplement    Ernest Haber, PA-C [place] [phone] 11/23/2019,1:44 PM  LOS: 4 days    Pt seen, examined and agree w A/P as above. HD tomorrow.  Kelly Splinter  MD 11/23/2019, 3:24 PM    Labs: Basic Metabolic Panel: Recent Labs  Lab 11/21/19 0410 11/22/19 1345 11/23/19 0400  NA 140 136 139  K 3.8 3.4* 3.8  CL 103 100 102  CO2 23 25 26   GLUCOSE 91 99 93  BUN 52* 28* 20  CREATININE 7.33* 4.54* 4.19*  CALCIUM 8.5* 8.2* 8.4*  PHOS 4.5 3.5 4.2   Liver Function Tests: Recent Labs  Lab 11/21/19 0410 11/22/19 1345 11/23/19 0400  AST 14* 20 20  ALT 12 12 12   ALKPHOS 44 37* 41  BILITOT 0.6 0.9 0.7  PROT 5.6* 5.1* 5.7*  ALBUMIN 1.9* 1.9* 1.9*   No results for input(s): LIPASE, AMYLASE in the last 168 hours. Recent Labs  Lab 11/18/19 1734  AMMONIA 22   CBC: Recent Labs  Lab 11/18/19 1734 11/18/19 2001 11/20/19 0451 11/20/19 0451 11/21/19 0410 11/22/19 1253 11/23/19 0400  WBC 15.8*   < > 11.6*   < > 10.4 10.7* 11.0*  NEUTROABS 12.6*   < > 8.9*  --  6.6  --  6.7  HGB 8.4*   < > 7.6*   < > 7.8* 7.9* 7.6*  HCT 24.7*   < > 23.2*   < > 24.0* 25.0* 24.2*  MCV 91.8  --  92.8  --  94.1 96.9 98.8  PLT 159   < > 165   < > 194 198 163   < > = values in this interval not displayed.   Cardiac Enzymes: No results for input(s): CKTOTAL, CKMB, CKMBINDEX, TROPONINI in the last 168 hours. CBG: Recent Labs  Lab 11/22/19 1130 11/22/19 2115 11/23/19 0132 11/23/19 0649 11/23/19 1111  GLUCAP 92 77 81 88 85    Studies/Results: No results found. Medications: . sodium chloride Stopped (11/18/19 2357)  . sodium chloride    .  ceFAZolin (ANCEF) IV     . aspirin EC  81 mg  Oral Daily  . atorvastatin  10 mg Oral q1800  . calcitRIOL  0.25 mcg Oral Daily  . carvedilol  25 mg Oral BID WC  . chlorhexidine      . Chlorhexidine Gluconate Cloth  6 each Topical Q0600  . citalopram  10 mg Oral Daily   And  . [START ON 11/24/2019] citalopram  20 mg Oral Daily  . darbepoetin (ARANESP) injection - DIALYSIS  40 mcg Intravenous Q Wed-HD  . feeding supplement (PRO-STAT SUGAR FREE 64)  30 mL Oral BID  . gabapentin  100 mg Oral QHS  . gelatin adsorbable      . heparin      . heparin  5,000 Units Subcutaneous Q8H  . hydrALAZINE  100 mg Oral Q8H  . insulin aspart  0-15 Units Subcutaneous TID WC  . insulin aspart  0-5 Units Subcutaneous QHS  . isosorbide mononitrate  15 mg Oral Daily  . lidocaine      . lisinopril  40 mg Oral Daily  . multivitamin  1 tablet Oral QHS  . sevelamer carbonate  800 mg Oral TID WC  . sodium chloride flush  10-40 mL Intracatheter Q12H  . sodium chloride flush  3 mL Intravenous Q12H  . sodium chloride flush  3 mL Intravenous Q12H  . spironolactone  25 mg Oral Daily

## 2019-11-24 DIAGNOSIS — N2581 Secondary hyperparathyroidism of renal origin: Secondary | ICD-10-CM | POA: Diagnosis not present

## 2019-11-24 DIAGNOSIS — Z992 Dependence on renal dialysis: Secondary | ICD-10-CM | POA: Diagnosis not present

## 2019-11-24 DIAGNOSIS — N186 End stage renal disease: Secondary | ICD-10-CM | POA: Diagnosis not present

## 2019-11-24 LAB — CBC WITH DIFFERENTIAL/PLATELET
Abs Immature Granulocytes: 0.38 10*3/uL — ABNORMAL HIGH (ref 0.00–0.07)
Basophils Absolute: 0 10*3/uL (ref 0.0–0.1)
Basophils Relative: 0 %
Eosinophils Absolute: 0.3 10*3/uL (ref 0.0–0.5)
Eosinophils Relative: 2 %
HCT: 23.7 % — ABNORMAL LOW (ref 36.0–46.0)
Hemoglobin: 7.4 g/dL — ABNORMAL LOW (ref 12.0–15.0)
Immature Granulocytes: 4 %
Lymphocytes Relative: 26 %
Lymphs Abs: 2.7 10*3/uL (ref 0.7–4.0)
MCH: 30.8 pg (ref 26.0–34.0)
MCHC: 31.2 g/dL (ref 30.0–36.0)
MCV: 98.8 fL (ref 80.0–100.0)
Monocytes Absolute: 0.7 10*3/uL (ref 0.1–1.0)
Monocytes Relative: 7 %
Neutro Abs: 6.3 10*3/uL (ref 1.7–7.7)
Neutrophils Relative %: 61 %
Platelets: 174 10*3/uL (ref 150–400)
RBC: 2.4 MIL/uL — ABNORMAL LOW (ref 3.87–5.11)
RDW: 14.5 % (ref 11.5–15.5)
WBC: 10.4 10*3/uL (ref 4.0–10.5)
nRBC: 0.2 % (ref 0.0–0.2)

## 2019-11-24 LAB — COMPREHENSIVE METABOLIC PANEL
ALT: 7 U/L (ref 0–44)
AST: 17 U/L (ref 15–41)
Albumin: 1.9 g/dL — ABNORMAL LOW (ref 3.5–5.0)
Alkaline Phosphatase: 40 U/L (ref 38–126)
Anion gap: 12 (ref 5–15)
BUN: 29 mg/dL — ABNORMAL HIGH (ref 8–23)
CO2: 24 mmol/L (ref 22–32)
Calcium: 8.7 mg/dL — ABNORMAL LOW (ref 8.9–10.3)
Chloride: 104 mmol/L (ref 98–111)
Creatinine, Ser: 5.56 mg/dL — ABNORMAL HIGH (ref 0.44–1.00)
GFR calc Af Amer: 9 mL/min — ABNORMAL LOW (ref >60)
GFR calc non Af Amer: 8 mL/min — ABNORMAL LOW (ref >60)
Glucose, Bld: 98 mg/dL (ref 70–99)
Potassium: 3.7 mmol/L (ref 3.5–5.1)
Sodium: 140 mmol/L (ref 135–145)
Total Bilirubin: 0.4 mg/dL (ref 0.3–1.2)
Total Protein: 5.5 g/dL — ABNORMAL LOW (ref 6.5–8.1)

## 2019-11-24 LAB — PHOSPHORUS: Phosphorus: 5 mg/dL — ABNORMAL HIGH (ref 2.5–4.6)

## 2019-11-24 LAB — MAGNESIUM: Magnesium: 2.2 mg/dL (ref 1.7–2.4)

## 2019-11-24 LAB — GLUCOSE, CAPILLARY
Glucose-Capillary: 150 mg/dL — ABNORMAL HIGH (ref 70–99)
Glucose-Capillary: 203 mg/dL — ABNORMAL HIGH (ref 70–99)
Glucose-Capillary: 138 mg/dL — ABNORMAL HIGH (ref 70–99)
Glucose-Capillary: 133 mg/dL — ABNORMAL HIGH (ref 70–99)

## 2019-11-24 MED ORDER — DARBEPOETIN ALFA 100 MCG/0.5ML IJ SOSY: 100.0000 ug | PREFILLED_SYRINGE | INTRAMUSCULAR | Status: DC

## 2019-11-24 MED ORDER — HEPARIN SODIUM (PORCINE) 1000 UNIT/ML IJ SOLN
INTRAMUSCULAR | Status: AC
  Administered 2019-11-24: 3200 [IU] via INTRAVENOUS
  Filled 2019-11-24: qty 4

## 2019-11-24 MED ORDER — HEPARIN SODIUM (PORCINE) 1000 UNIT/ML IJ SOLN
INTRAMUSCULAR | Status: AC
  Filled 2019-11-24: qty 5

## 2019-11-24 MED ORDER — HYDROXYZINE HCL 25 MG PO TABS
25.0000 mg | ORAL_TABLET | Freq: Three times a day (TID) | ORAL | Status: DC | PRN
  Administered 2019-11-24: 25 mg via ORAL
  Filled 2019-11-24: qty 1

## 2019-11-24 NOTE — TOC Initial Note (Signed)
Transition of Care [facility]) - Initial/Assessment Note    Patient Details  Name: Patricia [name] MRN: [id number] Date of Birth: Feb 16, 1956  Transition of [facility]) CM/SW Contact:    Sable Feil, LCSW Phone Number: 11/24/2019, 11:09 AM  Clinical Narrative: Call made to daughter Rynesha on 11/23/19 and she was asleep and asked that her sister Almon Register be contacted. Call made to [facility] (2/11) regarding SNF recommendation and daughter's plan for patient when medically stable.  Almon Register reported that prior to coming to hospital her mom was acting different, was yelling,and nervous and they knew something was wrong and brought her to the hospital Khyva reported that her mom is doing better now and is more herself, but doesn't know why she is in the hospital. When asked, Almon Register informed CSW that her mom lives with her sister Oletta Lamas. Khyva expressed awareness that her mom had gotten a dialysis catheter and reported that she would have dialysis until she has the abdominal surgery. Khyva added that after the surgery and recovery her mom can go back to PD if she wants to. Daughter was asked about HD transport at discharge and she indicated that she and her sister will transport and daughter was aware that her mother would be going to dialysis at [facility].   SNF consult discussed and daughter indicated that they don't want SNF and don't want her to have to ride SCAT or other forms of transportation to get to dialysis.  Khyva advised that [facility] services will be discussed with them closer to discharge.             Expected Discharge Plan: Skilled Nursing Facility(PT recommended SNF) Barriers to Discharge: Continued Medical Work up   Patient Goals and CMS Choice Patient states their goals for this hospitalization and ongoing recovery are:: Per daughter, the goal is for patient to get better medically and return home; and there family will look after her and take her to dialysis CMS Medicare.gov Compare Post Acute  Care list provided to:: Other (Comment Required)(Medicare.gov not discussed at this timer) Choice offered to / list presented to : NA(Medicare.gov non discussed during this conversation with daughter)  Expected Discharge Plan and Services Expected Discharge Plan: Skilled Nursing Facility(PT recommended SNF) In-house Referral: Clinical Social Work Discharge Planning Services: CM Consult   Living arrangements for the past 2 months: Apartment(lives with daughter Joslyn Hy)                                      Prior Living Arrangements/Services Living arrangements for the past 2 months: Apartment(lives with daughter Joslyn Hy) Lives with:: Adult Children(Daughter Rynesha) Patient language and need for interpreter reviewed:: No Do you feel safe going back to the place where you live?: Yes(Daughter expressed no concerns regarding her mother returning home at discharge)      Need for Family Participation in Patient Care: Yes (Comment) Care giver support system in place?: Yes (comment)   Criminal Activity/Legal Involvement Pertinent to Current Situation/Hospitalization: No - Comment as needed  Activities of Daily Living      Permission Sought/Granted   Permission granted to share information with : No(Patient currently with AMS and daughters contacted)              Emotional Assessment Appearance:: Appears stated age Attitude/Demeanor/Rapport: Unable to Assess(Have no talked with patient) Affect (typically observed): Unable to Assess Orientation: : Oriented to Self, Oriented to Place  Alcohol / Substance Use: Tobacco Use, Alcohol Use, Illicit Drugs(Per H&P patient has never smoked and does not drink or use illicit drugs) Psych Involvement: Yes (comment)([facility] consulted)  Admission diagnosis:  Hypokalemia [E87.6] ESRD (end stage renal disease) ([place]) [N18.6] Acute encephalopathy [G93.40] Generalized weakness [R53.1] Patient Active Problem List   Diagnosis Date  Noted  . Major depressive disorder, recurrent severe without psychotic features ([place]) 11/20/2019  . Generalized weakness 11/19/2019  . Acute encephalopathy 11/18/2019  . Sigmoid diverticulitis 10/10/2019  . ESRD on peritoneal dialysis ([place]) 07/06/2019  . Hypokalemia 06/16/2019  . Periumbilical hernia 63/84/5364  . ESRD (end stage renal disease) ([place]) 01/20/2019  . Chronic combined systolic and diastolic CHF (congestive heart failure) ([place]) 12/06/2018  . Fluid overload 12/05/2018  . Type II diabetes mellitus with renal manifestations ([place]) 12/05/2018  . Hypoglycemia 12/05/2018  . Acute on chronic combined systolic and diastolic CHF (congestive heart failure) ([place]) 06/14/2018  . CKD (chronic kidney disease) stage 5, GFR less than 15 ml/min (HCC) 06/14/2018  . Anemia due to stage 5 chronic kidney disease, not on chronic dialysis ([place]) 06/14/2018  . Systolic CHF ([place]) 68/12/2120  . Diabetes mellitus type 2 with complications ([place]) 48/25/0037  . Transaminasemia 11/29/2016  . Cardiomyopathy ([place]) 11/12/2016  . Lipoma 10/21/2016  . CKD (chronic kidney disease) stage 4, GFR 15-29 ml/min (HCC) 08/10/2016  . Anxiety and depression 12/12/2015  . Diabetic nephropathy associated with type 2 diabetes mellitus ([place]) 11/15/2014  . Moderate nonproliferative diabetic retinopathy(362.05) 04/17/2014  . 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 Hypertension associated with diabetes ([place]) 03/28/2014  . Dental caries 03/28/2014  . Dyslipidemia 04/18/2013   PCP:  Ladell Pier, MD Pharmacy:   [facility] [address] [place]), [place] - [address] [place] [address] Phone: [phone] Fax: [phone]     Social Determinants of Health (SDOH) Interventions  No SDOH interventions needed or requested.  Readmission Risk Interventions No flowsheet data found.

## 2019-11-24 NOTE — Progress Notes (Addendum)
Physical Therapy Treatment Patient Details Name: Patricia [name] MRN: [id number] DOB: Mar 31, 1956 Today's Date: 11/24/2019    History of Present Illness Pt is a 64 y/o female admitted secondary to altered mental status. Differential diagnosis includes MDD and acute encephalopathy. Pt is s/p R IJ cath. PMH include HTN, DM, CHF, and ESRD on PD.     PT Comments    Pt doing well today. More clear cognitively, oriented x 3 and following simple commands consistently. She required mod assist bed mobility, +2 mod assist transfers and +2 mod/HHA ambulation 7'. She fatigues quickly. Returned to bed at end of session. Pt scheduled to undergo first HD session today. If family declines SNF, pt will need HHPT. [name] may be a good fit, if pt is a candidate.    Follow Up Recommendations  SNF;Supervision/Assistance - 24 hour     Equipment Recommendations  Other (comment)(TBD)    Recommendations for Other Services       Precautions / Restrictions Precautions Precautions: Fall    Mobility  Bed Mobility Overal bed mobility: Needs Assistance Bed Mobility: Supine to Sit;Sit to Supine     Supine to sit: Mod assist Sit to supine: Mod assist   General bed mobility comments: Assist with trunk and BLEs. Increased time. +rail  Transfers Overall transfer level: Needs assistance Equipment used: 2 person hand held assist Transfers: Sit to/from [facility] Sit to Stand: +2 physical assistance;Mod assist Stand pivot transfers: +2 physical assistance;Mod assist       General transfer comment: Assist to power up and stabilized balance. Increased time. Cues for sequencing.  Ambulation/Gait Ambulation/Gait assistance: +2 physical assistance;Mod assist Gait Distance (Feet): 7 Feet Assistive device: 2 person hand held assist Gait Pattern/deviations: Step-through pattern;Decreased stride length;Narrow base of support Gait velocity: decreased Gait velocity interpretation: <1.8  ft/sec, indicate of risk for recurrent falls General Gait Details: Unsteady. Fatigues quickly. Max HR 122.   Stairs             Wheelchair Mobility    Modified Rankin (Stroke Patients Only)       Balance Overall balance assessment: Needs assistance Sitting-balance support: No upper extremity supported;Feet supported Sitting balance-Leahy Scale: Fair     Standing balance support: Bilateral upper extremity supported;During functional activity Standing balance-Leahy Scale: Poor Standing balance comment: Reliant on external support.                             Cognition Arousal/Alertness: Awake/alert Behavior During Therapy: Flat affect Overall Cognitive Status: No family/caregiver present to determine baseline cognitive functioning                                 General Comments: A and O x 3. Mild confusion but much more clear today. Following simple commands.      Exercises      General Comments        Pertinent Vitals/Pain Pain Assessment: Faces Faces Pain Scale: No hurt    Home Living                      Prior Function            PT Goals (current goals can now be found in the care plan section) Progress towards PT goals: Progressing toward goals    Frequency    Min 2X/week  PT Plan Current plan remains appropriate    Co-evaluation              AM-PAC PT "6 Clicks" Mobility   Outcome Measure  Help needed turning from your back to your side while in a flat bed without using bedrails?: A Lot Help needed moving from lying on your back to sitting on the side of a flat bed without using bedrails?: A Lot Help needed moving to and from a bed to a chair (including a wheelchair)?: A Lot Help needed standing up from a chair using your arms (e.g., wheelchair or bedside chair)?: A Lot Help needed to walk in hospital room?: A Lot Help needed climbing 3-5 steps with a railing? : Total 6 Click Score: 11     End of Session Equipment Utilized During Treatment: Gait belt Activity Tolerance: Patient tolerated treatment well Patient left: in bed;with call bell/phone within reach;with bed alarm set;with nursing/sitter in room Nurse Communication: Mobility status PT Visit Diagnosis: Other abnormalities of gait and mobility (R26.89);Unsteadiness on feet (R26.81);Muscle weakness (generalized) (M62.81);Difficulty in walking, not elsewhere classified (R26.2);Other symptoms and signs involving the nervous system (S06.301)     Time: 6010-9323 PT Time Calculation (min) (ACUTE ONLY): 15 min  Charges:  $Gait Training: 8-22 mins                     Lorrin Goodell, PT  Office # [phone] Pager [phone]    [name] 11/24/2019, 11:35 AM

## 2019-11-24 NOTE — Progress Notes (Signed)
Patient is having anxiety about going to HD today and daughter is requesting for her to have medication to help calm the patient. Alfredia Ferguson, MD notified. Orders placed and followed.

## 2019-11-24 NOTE — Progress Notes (Addendum)
[facility] Progress Note   Subjective:  Temp cath converted to [facility] by IR yesterday. Seen in room.  Calm, feeling a little "nervous" about dialysis. No CP, SOB.   Objective Vitals:   11/23/19 1455 11/23/19 1706 11/23/19 2037 11/24/19 0500  BP: 136/70 (!) 164/91 (!) 163/70 (!) 159/67  Pulse: 94 92 88 93  Resp: 15 18 16 16   Temp:   98.6 F (37 C) 98.9 F (37.2 C)  TempSrc:   Oral Oral  SpO2: 100% 100% 99% 99%  Weight:   66.6 kg   Height:        Weight change: 1.4 kg   Additional Objective Labs: Basic Metabolic Panel: Recent Labs  Lab 11/22/19 1345 11/23/19 0400 11/24/19 0515  NA 136 139 140  K 3.4* 3.8 3.7  CL 100 102 104  CO2 25 26 24   GLUCOSE 99 93 98  BUN 28* 20 29*  CREATININE 4.54* 4.19* 5.56*  CALCIUM 8.2* 8.4* 8.7*  PHOS 3.5 4.2 5.0*   CBC: Recent Labs  Lab 11/20/19 0451 11/20/19 0451 11/21/19 0410 11/21/19 0410 11/22/19 1253 11/23/19 0400 11/24/19 0515  WBC 11.6*   < > 10.4   < > 10.7* 11.0* 10.4  NEUTROABS 8.9*   < > 6.6  --   --  6.7 6.3  HGB 7.6*   < > 7.8*   < > 7.9* 7.6* 7.4*  HCT 23.2*   < > 24.0*   < > 25.0* 24.2* 23.7*  MCV 92.8  --  94.1  --  96.9 98.8 98.8  PLT 165   < > 194   < > 198 163 174   < > = values in this interval not displayed.   Blood Culture    Component Value Date/Time   SDES BLOOD LEFT HAND 11/18/2019 2005   SPECREQUEST  11/18/2019 2005    BOTTLES DRAWN AEROBIC AND ANAEROBIC Blood Culture adequate volume Performed at [facility], [address] [place], [place] [address]    CULT  11/18/2019 2005    NO GROWTH 5 DAYS Performed at [facility], [place] [address], [place], [place] [address]    REPTSTATUS 11/23/2019 FINAL 11/18/2019 2005     Physical Exam General:  Heart: RRR  Lungs: Clear bilaterally  Abdomen: soft, nontender: PD cath in place  Extremities: No LE edema  Dialysis Access: R IJ TDC in place   Medications: . sodium chloride Stopped (11/18/19 2357)  . sodium  chloride    .  ceFAZolin (ANCEF) IV     . aspirin EC  81 mg Oral Daily  . atorvastatin  10 mg Oral q1800  . calcitRIOL  0.25 mcg Oral Daily  . carvedilol  25 mg Oral BID WC  . Chlorhexidine Gluconate Cloth  6 each Topical Q0600  . citalopram  20 mg Oral Daily  . darbepoetin (ARANESP) injection - DIALYSIS  40 mcg Intravenous Q Wed-HD  . feeding supplement (PRO-STAT SUGAR FREE 64)  30 mL Oral BID  . gabapentin  100 mg Oral QHS  . heparin  5,000 Units Subcutaneous Q8H  . hydrALAZINE  100 mg Oral Q8H  . insulin aspart  0-15 Units Subcutaneous TID WC  . insulin aspart  0-5 Units Subcutaneous QHS  . isosorbide mononitrate  15 mg Oral Daily  . lisinopril  40 mg Oral Daily  . multivitamin  1 tablet Oral QHS  . sevelamer carbonate  800 mg Oral TID WC  . sodium chloride flush  10-40 mL Intracatheter Q12H  . sodium chloride flush  3 mL Intravenous Q12H  . sodium chloride flush  3 mL Intravenous Q12H  . spironolactone  25 mg Oral Daily    Dialysis Orders:  CCPD 7xwk, EDW 68.5kg 5 exchanges Fill Vol 2.4L, Dwell time 1.5hr No last fill/day exchange  Assessment/Plan: 1. AMS - CT head negative. Blood cultures /UA neg.  Now felt to have some uremic component as MS is improved after HD. Had HD 2/9, 2/10. For HD again today. Psych to assess patient as well 2. Hypokalemia - Resolving/ Using 4K bath on hd 3. ESRD- PD> now transitioning toHD. [place] placed 2/11. CLIP to OP center pending --discused with Santo Held (covering for Renal SW) . The plan was to convert from PD to HD and then undergo abdominal surgery by Dr. Lorelle Gibbs at [facility]. Apparently this surgery has been cancelled.  4.  Hypertension/volume- BP ok. Not grossly overloaded on exam. Attempt 2l uf today.  5. Anemiaof CKD- Hgb 7.4. Received Aranesp 40 on 2/10. Increase 100 q week and check Fe studies.  6. Secondary Hyperparathyroidism -Ca/Phos ok. Continue Renvelabinders. Not onVDRA 7. Nutrition-.Alb 1.9 (ho PD contributing  to some decr   Alb)  Renal diet/prot supp.   Lynnda Child PA-C [facility] Pager [phone] 11/24/2019,10:18 AM  LOS: 5 days

## 2019-11-24 NOTE — Consult Note (Addendum)
Telepsych Consultation   Reason for Consult: Agitation Referring Physician:  Internal medicine Location of Patient: 5M01C Location of Provider: [facility]  Patient Identification: Patricia [name] MRN:  [id number] Principal Diagnosis: Major depressive disorder, recurrent severe without psychotic features ([place]) Diagnosis:  Principal Problem:   Major depressive disorder, recurrent severe without psychotic features ([place]) Active Problems:   Dyslipidemia   Hypertension associated with diabetes ([place])   Anxiety and depression   CKD (chronic kidney disease) stage 5, GFR less than 15 ml/min (HCC)   Anemia due to stage 5 chronic kidney disease, not on chronic dialysis ([place])   Type II diabetes mellitus with renal manifestations ([place])   Chronic combined systolic and diastolic CHF (congestive heart failure) ([place])   Hypokalemia   Periumbilical hernia   ESRD on peritoneal dialysis ([place])   Sigmoid diverticulitis   Acute encephalopathy   Generalized weakness   Total Time spent with patient: 15 minutes  Subjective:   Patricia [name] is a 64 y.o. female patient was seen via teleassessment.  Daughter at bedside.  Patient was agreeable to allow daughter to stay during assessment.  She is awake alert and oriented x3.  She denies suicidal or homicidal ideations.  Denies auditory or visual hallucinations.  Was reported a few days prior " I felt like I was dreaming."  Denied previous mental health history.  Denied that she prescribed or taking any psychotropic or antidepressant medications.  States she was followed by psychiatrist and therapist 20+ years ago.  Denies auditory or visual hallucinations.  As reported by staff patient became agitated after dialysis.  However her mood has improved since initial consult.  Patient does endorse mild depression and anxiety related to declining health.  Continue Celexa 20 mg p.o. daily and gabapentin 100 mg p.o. nightly for mood stabilization.  Case staffed with  attending psychiatrist.  CSW to provide additional outpatient resources at discharge.  Support encouragement reassurance was provided.  HPI: per brief narrative noted: The patient Patricia [name] a 64 y.o.femalewith medical history significant ofend-stage renal disease, diabetes mellitus type 2, hypertension, chronic anemia, congestive heart failure, depression, recent history of diverticulitis on antibiotics, history of periumbilical tachypneapresented [facility] in [facility] status.CT head was negative. Patient uses PT for dialysis but patient had not had PT done for 3 days. She was subsequently admitted to [facility] under hospital service.   Past Psychiatric History:  Risk to Self:   Risk to Others:   Prior Inpatient Therapy:   Prior Outpatient Therapy:    Past Medical History:  Past Medical History:  Diagnosis Date  . Allergy   . Anemia   . Anxiety   . Blood transfusion without reported diagnosis   . Cardiomyopathy ([place]) 11/2016   no ischemic eval due to CKD  . CHF (congestive heart failure) ([place])   . CKD (chronic kidney disease), stage IV ([place])   . Depression   . Diabetes mellitus without complication ([place])    type 2  . Diverticulitis   . Dyspnea   . Hypertension   . Obesity     Past Surgical History:  Procedure Laterality Date  . ABDOMINAL HYSTERECTOMY    . BREAST SURGERY     reduction  . [place]  . IR FLUORO GUIDE CV LINE RIGHT  11/20/2019  . IR FLUORO GUIDE CV LINE RIGHT  11/23/2019  . IR US GUIDE VASC ACCESS RIGHT  11/20/2019  . LAPAROSCOPY N/A 06/16/2019   Procedure:  primary closure of umbilical hernia;  Surgeon: Ralene Ok, MD;  Location: [facility];  Service: General;  Laterality: N/A;  . LIPOMA EXCISION     back  Dr. Excell Seltzer 03-22-18  . LIPOMA EXCISION N/A 03/22/2018   Procedure: EXCISION OF BACK LIPOMA;  Surgeon: Excell Seltzer, MD;  Location: [facility];  Service: General;  Laterality: N/A;  . REDUCTION MAMMAPLASTY  Bilateral    Family History:  Family History  Problem Relation Age of Onset  . Diabetes Brother   . Stomach cancer Brother   . Kidney disease Brother   . Heart Problems Maternal Grandmother        pacemaker  . Heart disease Maternal Grandfather   . Rectal cancer Neg Hx   . Esophageal cancer Neg Hx   . Liver cancer Neg Hx   . Colon cancer Neg Hx    Family Psychiatric  History:  Social History:  Social History   Substance and Sexual Activity  Alcohol Use No     Social History   Substance and Sexual Activity  Drug Use No    Social History   Socioeconomic History  . Marital status: Divorced    Spouse name: Not on file  . Number of children: 2  . Years of education: Not on file  . Highest education level: Not on file  Occupational History  . Occupation: disabled  Tobacco Use  . Smoking status: Never Smoker  . Smokeless tobacco: Never Used  Substance and Sexual Activity  . Alcohol use: No  . Drug use: No  . Sexual activity: Not Currently  Other Topics Concern  . Not on file  Social History Narrative  . Not on file   Social Determinants of Health   Financial Resource Strain:   . Difficulty of Paying Living Expenses: Not on file  Food Insecurity:   . Worried About Charity fundraiser in the Last Year: Not on file  . Ran Out of Food in the Last Year: Not on file  Transportation Needs:   . Lack of Transportation (Medical): Not on file  . Lack of Transportation (Non-Medical): Not on file  Physical Activity:   . Days of Exercise per Week: Not on file  . Minutes of Exercise per Session: Not on file  Stress:   . Feeling of Stress : Not on file  Social Connections:   . Frequency of Communication with Friends and Family: Not on file  . Frequency of Social Gatherings with Friends and Family: Not on file  . Attends Religious Services: Not on file  . Active Member of Clubs or Organizations: Not on file  . Attends Archivist Meetings: Not on file  .  Marital Status: Not on file   Additional Social History:    Allergies:   Allergies  Allergen Reactions  . Codone [Hydrocodone] Itching  . Norvasc [Amlodipine Besylate] Swelling    Lower extremity  . Hydralazine Swelling and Other (See Comments)    Leg swelling. Pt takes this as o/p  . Sulfa Antibiotics Itching and Rash    Labs:  Results for orders placed or performed during the hospital encounter of 11/18/19 (from the past 48 hour(s))  Comprehensive metabolic panel     Status: Abnormal   Collection Time: 11/22/19  1:45 PM  Result Value Ref Range   Sodium 136 135 - 145 mmol/L   Potassium 3.4 (L) 3.5 - 5.1 mmol/L   Chloride 100 98 - 111 mmol/L   CO2 25 22 -  32 mmol/L   Glucose, Bld 99 70 - 99 mg/dL   BUN 28 (H) 8 - 23 mg/dL   Creatinine, Ser 4.54 (H) 0.44 - 1.00 mg/dL    Comment: DIALYSIS   Calcium 8.2 (L) 8.9 - 10.3 mg/dL   Total Protein 5.1 (L) 6.5 - 8.1 g/dL   Albumin 1.9 (L) 3.5 - 5.0 g/dL   AST 20 15 - 41 U/L   ALT 12 0 - 44 U/L   Alkaline Phosphatase 37 (L) 38 - 126 U/L   Total Bilirubin 0.9 0.3 - 1.2 mg/dL   GFR calc non Af Amer 10 (L) >60 mL/min   GFR calc Af Amer 11 (L) >60 mL/min   Anion gap 11 5 - 15    Comment: Performed at [facility] [address] [place], [place] [address]  Magnesium     Status: None   Collection Time: 11/22/19  1:45 PM  Result Value Ref Range   Magnesium 2.2 1.7 - 2.4 mg/dL    Comment: Performed at [facility], [place] [address] [place], [place] [address]  Phosphorus     Status: None   Collection Time: 11/22/19  1:45 PM  Result Value Ref Range   Phosphorus 3.5 2.5 - 4.6 mg/dL    Comment: Performed at [facility], [place] [address] [place], [place] [address]  Glucose, capillary     Status: None   Collection Time: 11/22/19  9:15 PM  Result Value Ref Range   Glucose-Capillary 77 70 - 99 mg/dL  Glucose, capillary     Status: None   Collection Time: 11/23/19  1:32 AM  Result Value Ref Range   Glucose-Capillary 81  70 - 99 mg/dL  CBC with Differential/Platelet     Status: Abnormal   Collection Time: 11/23/19  4:00 AM  Result Value Ref Range   WBC 11.0 (H) 4.0 - 10.5 K/uL   RBC 2.45 (L) 3.87 - 5.11 MIL/uL   Hemoglobin 7.6 (L) 12.0 - 15.0 g/dL   HCT 24.2 (L) 36.0 - 46.0 %   MCV 98.8 80.0 - 100.0 fL   MCH 31.0 26.0 - 34.0 pg   MCHC 31.4 30.0 - 36.0 g/dL   RDW 14.4 11.5 - 15.5 %   Platelets 163 150 - 400 K/uL   nRBC 0.0 0.0 - 0.2 %   Neutrophils Relative % 61 %   Neutro Abs 6.7 1.7 - 7.7 K/uL   Lymphocytes Relative 26 %   Lymphs Abs 2.9 0.7 - 4.0 K/uL   Monocytes Relative 8 %   Monocytes Absolute 0.8 0.1 - 1.0 K/uL   Eosinophils Relative 2 %   Eosinophils Absolute 0.3 0.0 - 0.5 K/uL   Basophils Relative 1 %   Basophils Absolute 0.1 0.0 - 0.1 K/uL   Immature Granulocytes 2 %   Abs Immature Granulocytes 0.26 (H) 0.00 - 0.07 K/uL    Comment: Performed at [facility], [place] [address] [place], [place] [address]  Comprehensive metabolic panel     Status: Abnormal   Collection Time: 11/23/19  4:00 AM  Result Value Ref Range   Sodium 139 135 - 145 mmol/L   Potassium 3.8 3.5 - 5.1 mmol/L   Chloride 102 98 - 111 mmol/L   CO2 26 22 - 32 mmol/L   Glucose, Bld 93 70 - 99 mg/dL   BUN 20 8 - 23 mg/dL   Creatinine, Ser 4.19 (H) 0.44 - 1.00 mg/dL   Calcium 8.4 (  L) 8.9 - 10.3 mg/dL   Total Protein 5.7 (L) 6.5 - 8.1 g/dL   Albumin 1.9 (L) 3.5 - 5.0 g/dL   AST 20 15 - 41 U/L   ALT 12 0 - 44 U/L   Alkaline Phosphatase 41 38 - 126 U/L   Total Bilirubin 0.7 0.3 - 1.2 mg/dL   GFR calc non Af Amer 11 (L) >60 mL/min   GFR calc Af Amer 12 (L) >60 mL/min   Anion gap 11 5 - 15    Comment: Performed at [facility] [address], [place], [place] [address]  Magnesium     Status: None   Collection Time: 11/23/19  4:00 AM  Result Value Ref Range   Magnesium 2.1 1.7 - 2.4 mg/dL    Comment: Performed at [facility] [address] [place], [place] [address]  Phosphorus     Status: None    Collection Time: 11/23/19  4:00 AM  Result Value Ref Range   Phosphorus 4.2 2.5 - 4.6 mg/dL    Comment: Performed at [facility] [address], [place], [place] [address]  Glucose, capillary     Status: None   Collection Time: 11/23/19  6:49 AM  Result Value Ref Range   Glucose-Capillary 88 70 - 99 mg/dL  Glucose, capillary     Status: None   Collection Time: 11/23/19 11:11 AM  Result Value Ref Range   Glucose-Capillary 85 70 - 99 mg/dL  Glucose, capillary     Status: None   Collection Time: 11/23/19  4:36 PM  Result Value Ref Range   Glucose-Capillary 87 70 - 99 mg/dL  Glucose, capillary     Status: Abnormal   Collection Time: 11/23/19  8:34 PM  Result Value Ref Range   Glucose-Capillary 210 (H) 70 - 99 mg/dL  CBC with Differential/Platelet     Status: Abnormal   Collection Time: 11/24/19  5:15 AM  Result Value Ref Range   WBC 10.4 4.0 - 10.5 K/uL   RBC 2.40 (L) 3.87 - 5.11 MIL/uL   Hemoglobin 7.4 (L) 12.0 - 15.0 g/dL   HCT 23.7 (L) 36.0 - 46.0 %   MCV 98.8 80.0 - 100.0 fL   MCH 30.8 26.0 - 34.0 pg   MCHC 31.2 30.0 - 36.0 g/dL   RDW 14.5 11.5 - 15.5 %   Platelets 174 150 - 400 K/uL   nRBC 0.2 0.0 - 0.2 %   Neutrophils Relative % 61 %   Neutro Abs 6.3 1.7 - 7.7 K/uL   Lymphocytes Relative 26 %   Lymphs Abs 2.7 0.7 - 4.0 K/uL   Monocytes Relative 7 %   Monocytes Absolute 0.7 0.1 - 1.0 K/uL   Eosinophils Relative 2 %   Eosinophils Absolute 0.3 0.0 - 0.5 K/uL   Basophils Relative 0 %   Basophils Absolute 0.0 0.0 - 0.1 K/uL   Immature Granulocytes 4 %   Abs Immature Granulocytes 0.38 (H) 0.00 - 0.07 K/uL    Comment: Performed at [facility], [place] [address], [place], [place] [address]  Comprehensive metabolic panel     Status: Abnormal   Collection Time: 11/24/19  5:15 AM  Result Value Ref Range   Sodium 140 135 - 145 mmol/L   Potassium 3.7 3.5 - 5.1 mmol/L   Chloride 104 98 - 111 mmol/L   CO2 24 22 - 32 mmol/L   Glucose, Bld 98 70 - 99 mg/dL   BUN  29  (H) 8 - 23 mg/dL   Creatinine, Ser 5.56 (H) 0.44 - 1.00 mg/dL   Calcium 8.7 (L) 8.9 - 10.3 mg/dL   Total Protein 5.5 (L) 6.5 - 8.1 g/dL   Albumin 1.9 (L) 3.5 - 5.0 g/dL   AST 17 15 - 41 U/L   ALT 7 0 - 44 U/L   Alkaline Phosphatase 40 38 - 126 U/L   Total Bilirubin 0.4 0.3 - 1.2 mg/dL   GFR calc non Af Amer 8 (L) >60 mL/min   GFR calc Af Amer 9 (L) >60 mL/min   Anion gap 12 5 - 15    Comment: Performed at [facility] [address] [place], [place] [address]  Magnesium     Status: None   Collection Time: 11/24/19  5:15 AM  Result Value Ref Range   Magnesium 2.2 1.7 - 2.4 mg/dL    Comment: Performed at [facility] [address] [place],  [place] [address]  Phosphorus     Status: Abnormal   Collection Time: 11/24/19  5:15 AM  Result Value Ref Range   Phosphorus 5.0 (H) 2.5 - 4.6 mg/dL    Comment: Performed at [facility] [address] [place], [place] [address]  Glucose, capillary     Status: Abnormal   Collection Time: 11/24/19  7:35 AM  Result Value Ref Range   Glucose-Capillary 150 (H) 70 - 99 mg/dL  Glucose, capillary     Status: Abnormal   Collection Time: 11/24/19 11:25 AM  Result Value Ref Range   Glucose-Capillary 203 (H) 70 - 99 mg/dL    Medications:  Current Facility-Administered Medications  Medication Dose Route Frequency Provider Last Rate Last Admin  . 0.9 %  sodium chloride infusion   Intravenous PRN Malvin Johns, MD   Stopped at 11/18/19 2357  . 0.9 %  sodium chloride infusion  250 mL Intravenous PRN Cristescu, Mircea G, MD      . acetaminophen (TYLENOL) tablet 650 mg  650 mg Oral Q6H PRN Cristescu, Linard Millers, MD   650 mg at 11/24/19 2774  . albuterol (PROVENTIL) (2.5 MG/3ML) 0.083% nebulizer solution 2.5 mg  2.5 mg Inhalation Q6H PRN Cristescu, Mircea G, MD      . aspirin EC tablet 81 mg  81 mg Oral Daily Cristescu, Mircea G, MD   81 mg at 11/23/19 1720  . atorvastatin (LIPITOR) tablet 10 mg  10 mg Oral q1800 Cristescu, Mircea G, MD    10 mg at 11/23/19 1721  . calcitRIOL (ROCALTROL) capsule 0.25 mcg  0.25 mcg Oral Daily Cristescu, Mircea G, MD   0.25 mcg at 11/23/19 1138  . carvedilol (COREG) tablet 25 mg  25 mg Oral BID WC Cristescu, Linard Millers, MD   25 mg at 11/23/19 1721  . Chlorhexidine Gluconate Cloth 2 % PADS 6 each  6 each Topical Q0600 Roney Jaffe, MD      . citalopram (CELEXA) tablet 20 mg  20 mg Oral Daily Darliss Cheney, MD      . Derrill Memo ON 11/29/2019] Darbepoetin Alfa (ARANESP) injection 100 mcg  100 mcg Intravenous Q Wed-HD Lynnda Child, PA-C      . feeding supplement (PRO-STAT SUGAR FREE 64) liquid 30 mL  30 mL Oral BID Cristescu, Linard Millers, MD   30 mL at 11/23/19 2147  . gabapentin (NEURONTIN) capsule 100 mg  100 mg Oral QHS Cristescu, Mircea G, MD   100 mg at 11/23/19 2147  .  haloperidol lactate (HALDOL) injection 2 mg  2 mg Intravenous Q6H PRN Sheikh, Omair Latif, DO      . heparin 1000 UNIT/ML injection           . heparin injection 5,000 Units  5,000 Units Subcutaneous Q8H Cristescu, Linard Millers, MD   5,000 Units at 11/24/19 [phone]  . hydrALAZINE (APRESOLINE) injection 10 mg  10 mg Intravenous Q4H PRN Cristescu, Mircea G, MD   10 mg at 11/21/19 0514  . hydrALAZINE (APRESOLINE) tablet 100 mg  100 mg Oral Q8H Pahwani, Einar Grad, MD   100 mg at 11/24/19 9371  . hydrOXYzine (ATARAX/VISTARIL) tablet 25 mg  25 mg Oral TID PRN Raiford Noble Latif, DO   25 mg at 11/24/19 0846  . insulin aspart (novoLOG) injection 0-15 Units  0-15 Units Subcutaneous TID WC Darliss Cheney, MD   5 Units at 11/24/19 1206  . insulin aspart (novoLOG) injection 0-5 Units  0-5 Units Subcutaneous QHS Darliss Cheney, MD   2 Units at 11/23/19 2148  . isosorbide mononitrate (IMDUR) 24 hr tablet 15 mg  15 mg Oral Daily Cristescu, Mircea G, MD   15 mg at 11/23/19 1721  . labetalol (NORMODYNE) injection 10 mg  10 mg Intravenous Q4H PRN Darliss Cheney, MD   10 mg at 11/21/19 0442  . lisinopril (ZESTRIL) tablet 40 mg  40 mg Oral Daily Cristescu, Mircea  G, MD   40 mg at 11/23/19 1721  . multivitamin (RENA-VIT) tablet 1 tablet  1 tablet Oral QHS Ernest Haber, PA-C   1 tablet at 11/23/19 2147  . ondansetron (ZOFRAN) tablet 4 mg  4 mg Oral Q6H PRN Cristescu, Mircea G, MD       Or  . ondansetron (ZOFRAN) injection 4 mg  4 mg Intravenous Q6H PRN Cristescu, Mircea G, MD      . oxyCODONE (Oxy IR/ROXICODONE) immediate release tablet 5 mg  5 mg Oral Q4H PRN Carlisle Cater, PA-C   5 mg at 11/18/19 2345  . sevelamer carbonate (RENVELA) tablet 800 mg  800 mg Oral TID WC Cristescu, Linard Millers, MD   800 mg at 11/24/19 1207  . sodium chloride flush (NS) 0.9 % injection 10-40 mL  10-40 mL Intracatheter Q12H Sheikh, Omair Latif, DO      . sodium chloride flush (NS) 0.9 % injection 10-40 mL  10-40 mL Intracatheter PRN Sheikh, Omair Latif, DO      . sodium chloride flush (NS) 0.9 % injection 3 mL  3 mL Intravenous Q12H Cristescu, Mircea G, MD   3 mL at 11/19/19 2300  . sodium chloride flush (NS) 0.9 % injection 3 mL  3 mL Intravenous Q12H Cristescu, Linard Millers, MD   3 mL at 11/23/19 2148  . sodium chloride flush (NS) 0.9 % injection 3 mL  3 mL Intravenous PRN Cristescu, Mircea G, MD      . spironolactone (ALDACTONE) tablet 25 mg  25 mg Oral Daily Cristescu, Linard Millers, MD   25 mg at 11/23/19 1721    Musculoskeletal:  Psychiatric Specialty Exam: Physical Exam  Nursing note and vitals reviewed. Constitutional: She appears well-developed.  Psychiatric: She has a normal mood and affect. Her behavior is normal.    Review of Systems  Psychiatric/Behavioral: Positive for agitation and behavioral problems. Negative for hallucinations. The patient is nervous/anxious.     Blood pressure (!) 154/77, pulse 97, temperature 98.1 F (36.7 C), temperature source Oral, resp. rate 18, height 5\' 4"  (1.626 m), weight 67.7 kg, SpO2 97 %.Body  mass index is 25.62 kg/m.  General Appearance: Casual and Guarded  Eye Contact:  Fair  Speech:  Clear and Coherent  Volume:  Normal   Mood:  Anxious and Depressed  Affect:  Congruent  Thought Process:  Coherent  Orientation:  Full (Time, Place, and Person)  Thought Content:  Logical  Suicidal Thoughts:  No  Homicidal Thoughts:  No  Memory:  Immediate;   Fair Recent;   Fair  Judgement:  Fair  Insight:  Fair  Psychomotor Activity:  Normal  Concentration:  Concentration: Fair  Recall:  [employer] of Knowledge:  Fair  Language:  Fair  Akathisia:  No  Handed:  Right  AIMS (if indicated):     Assets:  Communication Skills Desire for Improvement Resilience Social Support  ADL's:  Intact  Cognition:  WNL  Sleep:        Treatment Plan Summary: Daily contact with patient to assess and evaluate symptoms and progress in treatment and Medication management   -Consider decreasing Celexa 20 mg to 10 mg  p.o. daily -Continue gabapentin 100 mg p.o. nightly -Consider low-dose ativan  for severe agitation -Monitor EKG for QT prolongation  Medication adjustment - staffed with psychiatrist Jake Samples.   Thank you for contacting our services  Disposition: No evidence of imminent risk to self or others at present.   Patient does not meet criteria for psychiatric inpatient admission. Supportive therapy provided about ongoing stressors. Refer to IOP. Discussed crisis plan, support from social network, calling 911, coming to the Emergency Department, and calling Suicide Hotline.  This service was provided via telemedicine using a 2-way, interactive audio and video technology.  Names of all persons participating in this telemedicine service and their role in this encounter. Name: Esmee Fallaw Role: patient   Name: T. Georgi Tuel Role: NP   Name: Daphene Jaeger  Role: daughter       [place], NP 11/24/2019 1:14 PM

## 2019-11-24 NOTE — Discharge Planning (Addendum)
Patient has been scheduled at [facility] for incenter HD. Schedule is TTS 12:50. Patient will arrive 40 minutes early for first treatment. [facility] cannot start patient until Tuesday. Spoke to patient's daughter Oletta Lamas. She will transport the patient.

## 2019-11-24 NOTE — TOC Progression Note (Signed)
Transition of Care [facility]) - Progression Note    Patient Details  Name: Patricia [name] MRN: [id number] Date of Birth: 1956-05-20  [facility] CM/SW Contact  Bartholomew Crews, RN Phone Number: [phone] 11/24/2019, 4:41 PM  Clinical Narrative:    Patient currently in hemodialysis. Noted outpatient seat at [facility] TTS. Spoke with patient's daughter, Patricia [name], at [phone], to discuss [facility] PT and OT. Offered choice of [place] agencies and provided information that [employer] owns part of [facility] and offers enhanced benefit when using [facility]. [place] agreeable to using [facility].   Patient will need HH orders for PT and OT with [facility] at discharge.   No DME needs noted at this time. Patient already has wheelchair, bath tub seat, cane, and lift chair.   TOC team following for transition needs.   Expected Discharge Plan: [place] Barriers to Discharge: Continued Medical Work up  Expected Discharge Plan and Services Expected Discharge Plan: [place] In-house Referral: Clinical Social Work Discharge Planning Services: CM Consult Post Acute Care Choice: [place] arrangements for the past 2 months: Apartment(lives with daughter Patricia [name])                           [place] Arranged: PT, OT HH Agency: [facility] (formerly [employer]) Date [place]: 11/24/19 Time [place]: 1640 Representative spoke with at [place]: [place] ([place]) Interventions    Readmission Risk Interventions No flowsheet data found.

## 2019-11-24 NOTE — Progress Notes (Signed)
PROGRESS NOTE    Patricia [name]  VZD:[id number] DOB: [birth year] DOA: 11/18/2019 PCP: Ladell Pier, MD  Brief Narrative:  The patient Patricia [name] a 64 y.o.femalewith medical history significant ofend-stage renal disease, diabetes mellitus type 2, hypertension, chronic anemia, congestive heart failure, depression, recent history of diverticulitis on antibiotics, history of periumbilical tachypnea presented to [facility] ED for change in mental status.  CT head was negative.  Patient uses PT for dialysis but patient had not had PT done for 3 days.  She was subsequently admitted to [facility] under hospital service.    Nephrology was consulted and she has been getting hemodialysis here.  Patient had remained severely depressed and Often would not even make eye contact and Does not answer questions yesterday.  Psychiatry consulted and she was started on Celexa.    On 11/22/19 She was much more awake but was very confused and agitated and try to climb out of bed and screaming.  She was given IV lorazepam and taken for dialysis; She needed a tunneled dialysis catheter and needed to haver her TDC replaced as she is being converted to hemodialysis.  Because she remained encephalopathic and severely agitated psychiatry was reconsulted and they are still pending to see the patient again but unfortunately the patient was too drowsy this AM as she received Ativan last night.   Yesterday she had conversion on the right IJ temp cath for tunneled approach HD catheter by interventional radiology.  Psychiatry is still to reassess the patient as she has been severely agitated yesterday and somnolent today due to her receiving Ativan last night.  Today (11/24/19) she is going for Dialysis she is much more awake and alert and calm and cooperative.  She was feeling little anxious about going to dialysis still so she was started on hydroxyzine.  Assessment & Plan:   Principal Problem:   Major depressive  disorder, recurrent severe without psychotic features ([place]) Active Problems:   Dyslipidemia   Hypertension associated with diabetes ([place])   Anxiety and depression   CKD (chronic kidney disease) stage 5, GFR less than 15 ml/min (HCC)   Anemia due to stage 5 chronic kidney disease, not on chronic dialysis ([place])   Type II diabetes mellitus with renal manifestations (HCC)   Chronic combined systolic and diastolic CHF (congestive heart failure) (HCC)   Hypokalemia   Periumbilical hernia   ESRD on peritoneal dialysis ([place])   Sigmoid diverticulitis   Acute encephalopathy   Generalized weakness  Acute Encephalopathy with significant agitation and confusion, markedly improved -Initially would not answer questions and likely had severe depression so psychiatry was consulted and they started her on Celexa 10 mg p.o. daily for 3 days followed by 20 mg p.o. daily however the day before yesterday she was significantly agitated and combative trying to climb out of bed and had AMS -Yesterday she was somnolent and drowsy; today she is awake alert, cooperative and pleasant and having some mild anxiety -Recent head CT done showed no acute intracranial abnormality -Psychiatry was consulted again for her significant behavioral disturbances and agitation  -If continues to have significant disturbances may warrant an MRI and EEG but will allow for Ativan to wear off and now she is improved  -Continue to monitor closely and will put her on delirium precautions  End-stage renal disease now on hemodialysis Hyperphosphatemia  -She was on peritoneal dialysis but did not do her PD starting Thursday.   -Nephrology consulted and now changed to hemodialysis.  She received right IJ temporary HD catheter by IR and now will need a tunneled dialysis catheter which was done yesterday.  She is getting dialysis here per Nephrology  -Patient's BUN/creatinine went from 52/7.23 -> 28/4.54 -> 20/4.19 -> 29/5.56 and she is going  to be dialyzed today and they are going to try enema 2 L ultrafiltration today with a 4K bath -Patient's Phos Level was was 5.0 -Per Nephro clipped outpatient center still pending and plan is to convert the patient from a peritoneal dialysis to hemodialysis and then undergo abdominal surgery by Dr. Barbie Haggis at [facility] but this surgery has been canceled -Avoid nephrotoxic medications, contrast dyes, hypotension and renally dose medications -Repeat CMP in a.m.  Hypokalemia -Improved as K+ is now 3.7 -Continue monitor and replete as necessary  -Repeat CMP in a.m.  Recent history of diverticulitis - Discontinued her antibiotics of Augmentin Cipro.  Unknown whether she was taking at home or not.   -She was not supposed to continue this at home.  Essential Hypertension -Blood pressure remains elevated mostly because for some reason, her medications have been skipped and now she was significantly agitated.   -She is on 4 different medications and it makes me challenging to add any further medications unless she gets all her medications as a scheduled for at least 24 hours.   -She is on as needed hydralazine. -BP this AM was 371/69  Chronic diastolic congestive heart failure: -Currently appears euvolemic and was dialyzed yesterday  -Will hold off on Lasix now that volume management will be per Dialysis   CAD -No symptoms. Continue current medications if able  Type 2 diabetes mellitus -Blood sugar control.   -Start on SSI.  Last hemoglobin A1c 7.0.   -Takes Lantus only 6 units at home which is on hold here. -CBG's range from 85-210  Leukocytosis -Was mild mild with no signs of infection and she is afebrile. -WBC went from 10.4 -> 10.7 -> 11.0 -> 10.4 -Blood Cx x2 showed NGTD at 5 Days  -CXR was Negative and showed no active Cardiopulmonary Disease  Acute on chronic anemia of chronic disease -Baseline hemoglobin between 8 and 9.  Now Hb/Hct went from 7.6/24.2 ->  7.4/23.7 -Transfuse if hemoglobin less than 7. -Today to monitor for signs and symptoms of bleeding; currently no overt bleeding or -Repeat CBC in a.m.  DVT prophylaxis: Heparin 5,000 units sq q8h Code Status: FULL CODE  Family Communication: No family present at bedside  Disposition Plan: Patient from home requiring SNF when Nephrology workup is complete and she is CLIP to a Dialysis Center however [facility] have indicated they are against SNF and likely will want [facility]; Patient appears back to baseline and no longer encephlopathic and confused and combative  Consultants:   Nephrology  Interventional Radiology   Procedures:  Hemodialysis    Antimicrobials:  Anti-infectives (From admission, onward)   Start     Dose/Rate Route Frequency Ordered Stop   11/23/19 1403  ceFAZolin (ANCEF) 2-4 GM/100ML-% IVPB    Note to Pharmacy: L[place]le [place]   : cabinet override      11/23/19 1403 11/23/19 1416   11/23/19 1100  ceFAZolin (ANCEF) IVPB 1 g/50 mL premix     1 g 100 mL/hr over 30 Minutes Intravenous To Surgery 11/23/19 1056 11/24/19 1100   11/19/19 1000  amoxicillin-clavulanate (AUGMENTIN) 875-125 MG per tablet 1 tablet  Status:  Discontinued     1 tablet Oral Every 12 hours 11/19/19 0555 11/19/19 0749  11/19/19 1000  metroNIDAZOLE (FLAGYL) tablet 500 mg  Status:  Discontinued     500 mg Oral Every 12 hours 11/19/19 0555 11/19/19 0749     Subjective: Seen and examined calmer today and cooperative and awake and alert and oriented today.  She is feeling a little anxious about going to dialysis.  States that she had an okay night last night.  No chest pain, lightheadedness or dizziness.  No nausea or vomiting.  No other concerns or complaints at this time.  Objective: Vitals:   11/23/19 1455 11/23/19 1706 11/23/19 2037 11/24/19 0500  BP: 136/70 (!) 164/91 (!) 163/70 (!) 159/67  Pulse: 94 92 88 93  Resp: 15 18 16 16   Temp:   98.6 F (37 C) 98.9 F (37.2 C)  TempSrc:   Oral Oral   SpO2: 100% 100% 99% 99%  Weight:   66.6 kg   Height:        Intake/Output Summary (Last 24 hours) at 11/24/2019 0816 Last data filed at 11/24/2019 0600 Gross per 24 hour  Intake 340 ml  Output 0 ml  Net 340 ml   Filed Weights   11/21/19 1030 11/22/19 2046 11/23/19 2037  Weight: 65.2 kg 65.2 kg 66.6 kg   Examination: Physical Exam:  Constitutional: WN/WD slightly overweight African-American female currently in NAD and appears slightly anxious but appears comfortable  Eyes: Lids and conjunctivae normal, sclerae anicteric  ENMT: External Ears, Nose appear normal. Grossly normal hearing.  Neck: Appears normal, supple, no cervical masses, normal ROM, no appreciable thyromegaly Respiratory: Diminished to auscultation bilaterally, no wheezing, rales, rhonchi or crackles. Normal respiratory effort and patient is not tachypenic. No accessory muscle use. Unlabored breathing  Cardiovascular: RRR, no murmurs / rubs / gallops. S1 and S2 auscultated. No extremity edema. Abdomen: Soft, non-tender, mildly distended. Bowel sounds positive x4.  GU: Deferred. Musculoskeletal: No clubbing / cyanosis of digits/nails. No joint deformity upper and lower extremities.  Skin: No rashes, lesions, ulcers on a limited skin evaluation. No induration; Warm and dry.  Neurologic: CN 2-12 grossly intact with no focal deficits. S Romberg sign cerebellar reflexes not assessed.  Psychiatric: Normal judgment and insight. Alert and oriented x 3. Slightly anxious mood and appropriate affect.   Data Reviewed: I have personally reviewed following labs and imaging studies  CBC: Recent Labs  Lab 11/18/19 1734 11/18/19 2001 11/20/19 0451 11/21/19 0410 11/22/19 1253 11/23/19 0400 11/24/19 0515  WBC 15.8*   < > 11.6* 10.4 10.7* 11.0* 10.4  NEUTROABS 12.6*  --  8.9* 6.6  --  6.7 6.3  HGB 8.4*   < > 7.6* 7.8* 7.9* 7.6* 7.4*  HCT 24.7*   < > 23.2* 24.0* 25.0* 24.2* 23.7*  MCV 91.8   < > 92.8 94.1 96.9 98.8 98.8  PLT  159   < > 165 194 198 163 174   < > = values in this interval not displayed.   Basic Metabolic Panel: Recent Labs  Lab 11/20/19 0451 11/21/19 0410 11/22/19 1345 11/23/19 0400 11/24/19 0515  NA 137 140 136 139 140  K 3.8 3.8 3.4* 3.8 3.7  CL 104 103 100 102 104  CO2 22 23 25 26 24   GLUCOSE 169* 91 99 93 98  BUN 46* 52* 28* 20 29*  CREATININE 6.45* 7.33* 4.54* 4.19* 5.56*  CALCIUM 8.3* 8.5* 8.2* 8.4* 8.7*  MG 2.6* 2.7* 2.2 2.1 2.2  PHOS 4.4 4.5 3.5 4.2 5.0*   GFR: Estimated Creatinine Clearance: 10 mL/min (A) (by C-G  formula based on SCr of 5.56 mg/dL (H)). Liver Function Tests: Recent Labs  Lab 11/20/19 0451 11/21/19 0410 11/22/19 1345 11/23/19 0400 11/24/19 0515  AST 13* 14* 20 20 17   ALT 12 12 12 12 7   ALKPHOS 43 44 37* 41 40  BILITOT 0.4 0.6 0.9 0.7 0.4  PROT 5.3* 5.6* 5.1* 5.7* 5.5*  ALBUMIN 1.8* 1.9* 1.9* 1.9* 1.9*   No results for input(s): LIPASE, AMYLASE in the last 168 hours. Recent Labs  Lab 11/18/19 1734  AMMONIA 22   Coagulation Profile: Recent Labs  Lab 11/18/19 1734  INR 1.2   Cardiac Enzymes: No results for input(s): CKTOTAL, CKMB, CKMBINDEX, TROPONINI in the last 168 hours. BNP (last 3 results) No results for input(s): PROBNP in the last 8760 hours. HbA1C: No results for input(s): HGBA1C in the last 72 hours. CBG: Recent Labs  Lab 11/23/19 0649 11/23/19 1111 11/23/19 1636 11/23/19 2034 11/24/19 0735  GLUCAP 88 85 87 210* 150*   Lipid Profile: No results for input(s): CHOL, HDL, LDLCALC, TRIG, CHOLHDL, LDLDIRECT in the last 72 hours. Thyroid Function Tests: No results for input(s): TSH, T4TOTAL, FREET4, T3FREE, THYROIDAB in the last 72 hours. Anemia Panel: No results for input(s): VITAMINB12, FOLATE, FERRITIN, TIBC, IRON, RETICCTPCT in the last 72 hours. Sepsis Labs: Recent Labs  Lab 11/18/19 1956  LATICACIDVEN 0.7    Recent Results (from the past 240 hour(s))  SARS CORONAVIRUS 2 (TAT 6-24 HRS) Nasopharyngeal  Nasopharyngeal Swab     Status: None   Collection Time: 11/18/19  7:00 PM   Specimen: Nasopharyngeal Swab  Result Value Ref Range Status   SARS Coronavirus 2 NEGATIVE NEGATIVE Final    Comment: (NOTE) SARS-CoV-2 target nucleic acids are NOT DETECTED. The SARS-CoV-2 RNA is generally detectable in upper and lower respiratory specimens during the acute phase of infection. Negative results do not preclude SARS-CoV-2 infection, do not rule out co-infections with other pathogens, and should not be used as the sole basis for treatment or other patient management decisions. Negative results must be combined with clinical observations, patient history, and epidemiological information. The expected result is Negative. Fact Sheet for Patients: SugarRoll.be Fact Sheet for Healthcare Providers: https://www.woods-mathews.com/ This test is not yet approved or cleared by the [place] FDA and  has been authorized for detection and/or diagnosis of SARS-CoV-2 by FDA under an Emergency Use Authorization (EUA). This EUA will remain  in effect (meaning this test can be used) for the duration of the COVID-19 declaration under Section 56 4(b)(1) of the Act, 21 U.S.C. section 360bbb-3(b)(1), unless the authorization is terminated or revoked sooner. Performed at [facility], [place] [address], [place], [place] [address]   Blood culture (routine x 2)     Status: None   Collection Time: 11/18/19  7:56 PM   Specimen: BLOOD RIGHT HAND  Result Value Ref Range Status   Specimen Description BLOOD RIGHT HAND  Final   Special Requests   Final    BOTTLES DRAWN AEROBIC AND ANAEROBIC Blood Culture results may not be optimal due to an inadequate volume of blood received in culture bottles Performed at [facility], [address] [place], [place] [address]    Culture   Final    NO GROWTH 5 DAYS Performed at [facility], [place] [address]  [place], [place] [address]    Report Status 11/23/2019 FINAL  Final  Blood culture (routine x 2)     Status: None   Collection Time:  11/18/19  8:05 PM   Specimen: BLOOD LEFT HAND  Result Value Ref Range Status   Specimen Description BLOOD LEFT HAND  Final   Special Requests   Final    BOTTLES DRAWN AEROBIC AND ANAEROBIC Blood Culture adequate volume Performed at [facility], [place] [place], [place] [address]    Culture   Final    NO GROWTH 5 DAYS Performed at [facility], [place] [address], [place], [place] [address]    Report Status 11/23/2019 FINAL  Final  SARS Coronavirus 2 by RT PCR (hospital order, performed in [facility] hospital lab) Nasopharyngeal Nasopharyngeal Swab     Status: None   Collection Time: 11/18/19  9:50 PM   Specimen: Nasopharyngeal Swab  Result Value Ref Range Status   SARS Coronavirus 2 NEGATIVE NEGATIVE Final    Comment: Performed at [facility], [address] [place], [place] [address]     RN Pressure Injury Documentation:     Estimated body mass index is 25.2 kg/m as calculated from the following:   Height as of this encounter: 5\' 4"  (1.626 m).   Weight as of this encounter: 66.6 kg.  Malnutrition Type:      Malnutrition Characteristics:      Nutrition Interventions:    Radiology Studies: IR Fluoro Guide CV Line Right  Result Date: 11/23/2019 INDICATION: 64 year old female with a history of end-stage renal disease. EXAM: TUNNELED CENTRAL VENOUS HEMODIALYSIS CATHETER PLACEMENT WITH ULTRASOUND AND FLUOROSCOPIC GUIDANCE MEDICATIONS: 2 g Ancef. The antibiotic was given in an appropriate time interval prior to skin puncture. ANESTHESIA/SEDATION: Moderate (conscious) sedation was employed during this procedure. A total of Versed 1.0 mg and Fentanyl 100 mcg was administered intravenously. Moderate Sedation Time: 13 minutes. The patient's level of consciousness and vital signs were monitored continuously by  radiology nursing throughout the procedure under my direct supervision. FLUOROSCOPY TIME:  Fluoroscopy Time: 0 minutes 48 seconds (7 mGy). COMPLICATIONS: None PROCEDURE: Informed written consent was obtained from the patient after a discussion of the risks, benefits, and alternatives to treatment. Questions regarding the procedure were encouraged and answered. The right neck and chest were prepped with chlorhexidine in a sterile fashion, and a sterile drape was applied covering the operative field. Maximum barrier sterile technique with sterile gowns and gloves were used for the procedure. A timeout was performed prior to the initiation of the procedure. Wire was passed through the indwelling temporary right IJ hemodialysis catheter with internal estimate of length performed. A total tip to cuff length of 19 cm was selected. Bentson wire was passed into the IVC. Catheter was removed from the Bentson wire. Skin and subcutaneous tissues of chest wall below the clavicle were generously infiltrated with 1% lidocaine for local anesthesia. A small stab incision was made with 11 blade scalpel. The selected hemodialysis catheter was tunneled in a retrograde fashion from the anterior chest wall to the venotomy incision. Peel-away sheath was placed. The catheter was then placed through the peel-away sheath with tips ultimately positioned within the superior aspect of the right atrium. Final catheter positioning was confirmed and documented with a spot radiographic image. The catheter aspirates and flushes normally. The catheter was flushed with appropriate volume heparin dwells. The catheter exit site was secured with a 0-Prolene retention suture. The venotomy incision was closed Derma bond and sterile dressing. Dressings were applied at the chest wall. Patient tolerated the procedure well and remained hemodynamically stable throughout. No complications were encountered  and no significant blood loss encountered. IMPRESSION:  Status post placement of a tunneled right IJ 19 cm tip to cuff hemodialysis catheter. Signed, Dulcy Fanny. Dellia Nims, RPVI [facility] Radiology Electronically Signed   By: Corrie Mckusick D.O.   On: 11/23/2019 17:11   Scheduled Meds:  aspirin EC  81 mg Oral Daily   atorvastatin  10 mg Oral q1800   calcitRIOL  0.25 mcg Oral Daily   carvedilol  25 mg Oral BID WC   Chlorhexidine Gluconate Cloth  6 each Topical Q0600   citalopram  10 mg Oral Daily   And   citalopram  20 mg Oral Daily   darbepoetin (ARANESP) injection - DIALYSIS  40 mcg Intravenous Q Wed-HD   feeding supplement (PRO-STAT SUGAR FREE 64)  30 mL Oral BID   gabapentin  100 mg Oral QHS   heparin  5,000 Units Subcutaneous Q8H   hydrALAZINE  100 mg Oral Q8H   insulin aspart  0-15 Units Subcutaneous TID WC   insulin aspart  0-5 Units Subcutaneous QHS   isosorbide mononitrate  15 mg Oral Daily   lisinopril  40 mg Oral Daily   multivitamin  1 tablet Oral QHS   sevelamer carbonate  800 mg Oral TID WC   sodium chloride flush  10-40 mL Intracatheter Q12H   sodium chloride flush  3 mL Intravenous Q12H   sodium chloride flush  3 mL Intravenous Q12H   spironolactone  25 mg Oral Daily   Continuous Infusions:  sodium chloride Stopped (11/18/19 2357)   sodium chloride      ceFAZolin (ANCEF) IV      LOS: 5 days   Kerney Elbe, DO [facility] PAGER is on AMION  If 7PM-7AM, please contact night-coverage [email or web address]

## 2019-11-24 NOTE — Progress Notes (Deleted)
Patricia [name] Progress Note   Subjective:  Temp cath converted to [facility] by IR yesterday. Seen in room.  Calm, feeling a little "nervous" about dialysis. No CP.   Objective Vitals:   11/24/19 1247 11/24/19 1257 11/24/19 1301 11/24/19 1330  BP: (!) 167/75 (!) 167/75 (!) 154/77 (!) 163/85  Pulse: 98 96 97 95  Resp: (!) 21 (!) 21 18 17   Temp: 98.1 F (36.7 C)     TempSrc: Oral     SpO2: 97%     Weight: 67.7 kg     Height:        Weight change: 1.4 kg   Additional Objective Labs: Basic Metabolic Panel: Recent Labs  Lab 11/22/19 1345 11/23/19 0400 11/24/19 0515  NA 136 139 140  K 3.4* 3.8 3.7  CL 100 102 104  CO2 25 26 24   GLUCOSE 99 93 98  BUN 28* 20 29*  CREATININE 4.54* 4.19* 5.56*  CALCIUM 8.2* 8.4* 8.7*  PHOS 3.5 4.2 5.0*   CBC: Recent Labs  Lab 11/20/19 0451 11/20/19 0451 11/21/19 0410 11/21/19 0410 11/22/19 1253 11/23/19 0400 11/24/19 0515  WBC 11.6*   < > 10.4   < > 10.7* 11.0* 10.4  NEUTROABS 8.9*   < > 6.6  --   --  6.7 6.3  HGB 7.6*   < > 7.8*   < > 7.9* 7.6* 7.4*  HCT 23.2*   < > 24.0*   < > 25.0* 24.2* 23.7*  MCV 92.8  --  94.1  --  96.9 98.8 98.8  PLT 165   < > 194   < > 198 163 174   < > = values in this interval not displayed.   Blood Culture    Component Value Date/Time   SDES BLOOD LEFT HAND 11/18/2019 2005   SPECREQUEST  11/18/2019 2005    BOTTLES DRAWN AEROBIC AND ANAEROBIC Blood Culture adequate volume Performed at [facility], [address] [place], [place] [address]    CULT  11/18/2019 2005    NO GROWTH 5 DAYS Performed at [facility], [place] [address], [place], [place] [address]    REPTSTATUS 11/23/2019 FINAL 11/18/2019 2005     Physical Exam General:  Heart: RRR  Lungs: Clear bilaterally  Abdomen: soft, nontender: PD cath in place  Extremities: No LE edema  Dialysis Access: R IJ TDC in place   Medications: . sodium chloride Stopped (11/18/19 2357)  . sodium chloride     . aspirin EC   81 mg Oral Daily  . atorvastatin  10 mg Oral q1800  . calcitRIOL  0.25 mcg Oral Daily  . carvedilol  25 mg Oral BID WC  . Chlorhexidine Gluconate Cloth  6 each Topical Q0600  . citalopram  20 mg Oral Daily  . [START ON 11/29/2019] darbepoetin (ARANESP) injection - DIALYSIS  100 mcg Intravenous Q Wed-HD  . feeding supplement (PRO-STAT SUGAR FREE 64)  30 mL Oral BID  . gabapentin  100 mg Oral QHS  . heparin  5,000 Units Subcutaneous Q8H  . hydrALAZINE  100 mg Oral Q8H  . insulin aspart  0-15 Units Subcutaneous TID WC  . insulin aspart  0-5 Units Subcutaneous QHS  . isosorbide mononitrate  15 mg Oral Daily  . lisinopril  40 mg Oral Daily  . multivitamin  1 tablet Oral QHS  . sevelamer carbonate  800 mg Oral TID WC  . sodium chloride flush  10-40 mL Intracatheter Q12H  .  sodium chloride flush  3 mL Intravenous Q12H  . sodium chloride flush  3 mL Intravenous Q12H  . spironolactone  25 mg Oral Daily    Dialysis Orders:  [facility] 7xwk, EDW 68.5kg 5 exchanges Fill Vol 2.4L, Dwell time 1.5hr No last fill/day exchange  Assessment/Plan: 1. AMS - CT head negative. Blood cultures /UA neg. Felt to have uremia as MS is much improved after HD x 2. Had HD 2/9, 2/10. For HD again today. Psych to assess patient as well 2. Hypokalemia - Resolving/ Using 4K bath on hd 3. ESRD- PD> now transitioning toHD. Nolan placed 2/11. CLIP to OP center pending --discused with Santo Held (covering for Renal SW) . The plan was to convert from PD to HD and then undergo abdominal surgery by Dr. Lorelle Gibbs at [facility]. Dr Joelyn Oms notified surgeon Teppara that surgery will need to be rescheduled.  4.  Hypertension/volume- BP ok. Not grossly overloaded on exam. Attempt 2l uf today.  5. Anemiaof CKD- Hgb 7.4. Received Aranesp 40 on 2/10. Increase 100 q week and check Fe studies.  6. Secondary Hyperparathyroidism -Ca/Phos ok. Continue Renvelabinders. Not onVDRA 7. Nutrition-.Alb 1.9 (ho PD contributing to some decr    Alb)  Renal diet/prot supp.   Patricia Child PA-C [name] Pager [phone] 11/24/2019,2:05 PM  LOS: 5 days   Pt seen, examined and agree w A/P as above.  Kelly Splinter  MD 11/24/2019, 2:06 PM

## 2019-11-25 DIAGNOSIS — K5732 Diverticulitis of large intestine without perforation or abscess without bleeding: Secondary | ICD-10-CM

## 2019-11-25 DIAGNOSIS — N186 End stage renal disease: Secondary | ICD-10-CM | POA: Diagnosis not present

## 2019-11-25 DIAGNOSIS — N2581 Secondary hyperparathyroidism of renal origin: Secondary | ICD-10-CM | POA: Diagnosis not present

## 2019-11-25 DIAGNOSIS — R531 Weakness: Secondary | ICD-10-CM

## 2019-11-25 DIAGNOSIS — Z992 Dependence on renal dialysis: Secondary | ICD-10-CM | POA: Diagnosis not present

## 2019-11-25 LAB — CBC WITH DIFFERENTIAL/PLATELET
Abs Immature Granulocytes: 0.45 K/uL — ABNORMAL HIGH (ref 0.00–0.07)
Basophils Absolute: 0.1 K/uL (ref 0.0–0.1)
Basophils Relative: 1 %
Eosinophils Absolute: 0.2 K/uL (ref 0.0–0.5)
Eosinophils Relative: 2 %
HCT: 24.4 % — ABNORMAL LOW (ref 36.0–46.0)
Hemoglobin: 7.6 g/dL — ABNORMAL LOW (ref 12.0–15.0)
Immature Granulocytes: 4 %
Lymphocytes Relative: 32 %
Lymphs Abs: 3.5 K/uL (ref 0.7–4.0)
MCH: 30.4 pg (ref 26.0–34.0)
MCHC: 31.1 g/dL (ref 30.0–36.0)
MCV: 97.6 fL (ref 80.0–100.0)
Monocytes Absolute: 0.7 K/uL (ref 0.1–1.0)
Monocytes Relative: 6 %
Neutro Abs: 5.9 K/uL (ref 1.7–7.7)
Neutrophils Relative %: 55 %
Platelets: 201 K/uL (ref 150–400)
RBC: 2.5 MIL/uL — ABNORMAL LOW (ref 3.87–5.11)
RDW: 14.8 % (ref 11.5–15.5)
WBC: 10.7 K/uL — ABNORMAL HIGH (ref 4.0–10.5)
nRBC: 0.2 % (ref 0.0–0.2)

## 2019-11-25 LAB — COMPREHENSIVE METABOLIC PANEL WITH GFR
ALT: 5 U/L (ref 0–44)
AST: 16 U/L (ref 15–41)
Albumin: 2 g/dL — ABNORMAL LOW (ref 3.5–5.0)
Alkaline Phosphatase: 43 U/L (ref 38–126)
Anion gap: 10 (ref 5–15)
BUN: 8 mg/dL (ref 8–23)
CO2: 26 mmol/L (ref 22–32)
Calcium: 8.1 mg/dL — ABNORMAL LOW (ref 8.9–10.3)
Chloride: 100 mmol/L (ref 98–111)
Creatinine, Ser: 3.38 mg/dL — ABNORMAL HIGH (ref 0.44–1.00)
GFR calc Af Amer: 16 mL/min — ABNORMAL LOW
GFR calc non Af Amer: 14 mL/min — ABNORMAL LOW
Glucose, Bld: 122 mg/dL — ABNORMAL HIGH (ref 70–99)
Potassium: 3.9 mmol/L (ref 3.5–5.1)
Sodium: 136 mmol/L (ref 135–145)
Total Bilirubin: 0.3 mg/dL (ref 0.3–1.2)
Total Protein: 5.8 g/dL — ABNORMAL LOW (ref 6.5–8.1)

## 2019-11-25 LAB — GLUCOSE, CAPILLARY
Glucose-Capillary: 126 mg/dL — ABNORMAL HIGH (ref 70–99)
Glucose-Capillary: 162 mg/dL — ABNORMAL HIGH (ref 70–99)
Glucose-Capillary: 135 mg/dL — ABNORMAL HIGH (ref 70–99)
Glucose-Capillary: 95 mg/dL (ref 70–99)

## 2019-11-25 LAB — IRON AND TIBC
Iron: 30 ug/dL (ref 28–170)
Saturation Ratios: 24 % (ref 10.4–31.8)
TIBC: 125 ug/dL — ABNORMAL LOW (ref 250–450)
UIBC: 95 ug/dL

## 2019-11-25 LAB — MAGNESIUM: Magnesium: 1.8 mg/dL (ref 1.7–2.4)

## 2019-11-25 LAB — FERRITIN: Ferritin: 1490 ng/mL — ABNORMAL HIGH (ref 11–307)

## 2019-11-25 LAB — PHOSPHORUS: Phosphorus: 2.6 mg/dL (ref 2.5–4.6)

## 2019-11-25 MED ORDER — RENA-VITE PO TABS: 1.0000 | ORAL_TABLET | Freq: Every day | ORAL | 0 refills | Status: AC

## 2019-11-25 MED ORDER — WHITE PETROLATUM EX OINT
TOPICAL_OINTMENT | CUTANEOUS | Status: AC
  Administered 2019-11-25: 0.2
  Filled 2019-11-25: qty 28.35

## 2019-11-25 MED ORDER — CITALOPRAM HYDROBROMIDE 20 MG PO TABS: 20.0000 mg | ORAL_TABLET | Freq: Every day | ORAL | 0 refills | Status: DC

## 2019-11-25 MED ORDER — HYDROXYZINE HCL 25 MG PO TABS: 25.0000 mg | ORAL_TABLET | Freq: Three times a day (TID) | ORAL | 0 refills | Status: DC | PRN

## 2019-11-25 MED ORDER — HEPARIN SODIUM (PORCINE) 1000 UNIT/ML IJ SOLN
INTRAMUSCULAR | Status: AC
  Administered 2019-11-25: 3200 [IU] via INTRAVENOUS_CENTRAL
  Filled 2019-11-25: qty 4

## 2019-11-25 NOTE — Discharge Summary (Signed)
Physician Discharge Summary  Patricia [name]:[id number] DOB: 07/14/1956 DOA: 11/18/2019  PCP: Ladell Pier, MD  Admit date: 11/18/2019 Discharge date: 11/25/2019  Admitted From: Home Disposition: Home with Home Health PT/OT as patient and family decline SNF  Recommendations for Outpatient Follow-up:  1. Follow up with PCP in 1-2 weeks 2. Follow up with Nephrology within 1-2 weeks; Go to Dialysis at [facility] as scheduled  3. Follow up with Psychiatry as an outpatient  4. Please obtain CMP/CBC, Mag, Phos in one week 5. Please follow up on the following pending results:  Home Health: Yes Equipment/Devices: None    Discharge Condition: Stable CODE STATUS: FULL CODE Diet recommendation: Heart Healthy Renal Carb Modified Diet  Brief/Interim Summary: The patient Patricia [name] a 64 y.o.femalewith medical history significant ofend-stage renal disease, diabetes mellitus type 2, hypertension, chronic anemia, congestive heart failure, depression, recent history of diverticulitis on antibiotics, history of periumbilical tachypneapresented to [facility] forchange in mental status.CT head was negative. Patient uses PT for dialysis but patient had not had PT done for 3 days. She was subsequently admitted to [facility] under hospital service.   Nephrology was consulted and she has been getting hemodialysis here. Patient had remained severely depressed andOften would not even make eye contact andDoes not answer questions yesterday. Psychiatry consulted and she was started on Celexa.    On 11/22/19 She was much more awake but was very confused and agitated and try to climb out of bed and screaming.  She was given IV lorazepam and taken for dialysis; She needed a tunneled dialysis catheter and needed to haver her TDC replaced as she is being converted to hemodialysis.  Because she remained encephalopathic and severely agitated psychiatry was reconsulted and they are still pending to see the  patient again but unfortunately the patient was too drowsy this AM as she received Ativan last night.   The day before yesterday she had conversion on the right IJ temp cath for tunneled approach HD catheter by interventional radiology.  Psychiatry is still to reassess the patient as she has been severely agitated yesterday and somnolent today due to her receiving Ativan last night.  Yesterday (11/24/19) she is going for Dialysis she is much more awake and alert and calm and cooperative.  She was feeling little anxious about going to dialysis still so she was started on hydroxyzine  Today the patient is much more awake and alert and she is sitting in the chair and pleasant and back to her baseline.  She is going for dialysis today and she has been deemed stable for discharge given that she now has an outpatient seat for hemodialysis.  She improved remarkably and stable for discharge at this time and follow-up with nephrology in the outpatient setting for continued dialysis  Discharge Diagnoses:  Principal Problem:   Major depressive disorder, recurrent severe without psychotic features ([place]) Active Problems:   Dyslipidemia   Hypertension associated with diabetes ([place])   Anxiety and depression   CKD (chronic kidney disease) stage 5, GFR less than 15 ml/min (HCC)   Anemia due to stage 5 chronic kidney disease, not on chronic dialysis ([place])   Type II diabetes mellitus with renal manifestations ([place])   Chronic combined systolic and diastolic CHF (congestive heart failure) (HCC)   Hypokalemia   Periumbilical hernia   ESRD on peritoneal dialysis ([place])   Sigmoid diverticulitis   Acute encephalopathy   Generalized weakness  Acute Encephalopathy with significant agitation and confusion, markedly  improved and back to baseline -Initially would not answer questions and likely had severe depression so psychiatry was consulted and they started her on Celexa 10 mg p.o. daily for 3 days followed by 20 mg  p.o. daily 3 days ago she was significantly agitated and combative trying to climb out of bed and had AMS -The day before yesterday she was somnolent and drowsy;  yesterday she is awake alert, cooperative and pleasant and having some mild anxiety; today she is back to her baseline and is awake alert and oriented -Recent head CT done showed no acute intracranial abnormality -Psychiatry was consulted again for her significant behavioral disturbances and agitation  -If continues to have significant disturbances may warrant an MRI and EEG but will allow for Ativan to wear off and now she is improved  -Continue to monitor closely and will put her on delirium precautions -Follow-up in outpatient setting follow-up with psychiatry  End-stage renal disease now on hemodialysis Hyperphosphatemia  -She was on peritoneal dialysis but did not do her PD starting Thursday.  -Nephrology consulted and now changed to hemodialysis. She received right IJ temporary HD catheter by IR and now will need a tunneled dialysis catheter which was done yesterday. She is getting dialysis here per Nephrology  -Patient's BUN/creatinine went from 52/7.23 -> 28/4.54 -> 20/4.19 -> 29/5.56 -> 8/3.38 and she is going to be dialyzed today  for short session to get her on track for Tuesday Thursday Saturday dialysis schedule -Patient's Phos Level was was 5.0 and repeat this morning was 2.6 -Per Nephro, CLIPPED to outpatient center has been done at [facility] the patient from a peritoneal dialysis to hemodialysis and then undergo abdominal surgery by Dr. Barbie Haggis at [facility] but this surgery has been canceled  -Avoid nephrotoxic medications, contrast dyes, hypotension and renally dose medications -Repeat CMP within 1 week  Hypokalemia -Improved as K+ is now 3.9 -Continue monitor and replete as necessary  -Repeat CMP in a.m.  Recent history of diverticulitis - Discontinued her antibiotics of Augmentin Cipro.Unknown  whether she was taking at home or not.  -She was not supposed to continue this at home.  Essential Hypertension -Blood pressure remains elevated mostly because for some reason, her medications have been skipped and now she was significantly agitated.  -She is on 4 different medicationsand it makes me challenging to add any further medications unless she gets all her medications as a scheduled for at least 24 hours.  -She is on as needed hydralazine. -BP this AM was  086/76  Chronic diastolic congestive heart failure: -Currently appears euvolemic and was dialyzed yesterday  -Will hold off on Lasix while hospitalized now that volume management will be per Dialysis but per nephrology can go back on her home diuretics of Lasix and spironolactone at discharge  CAD -No symptoms. Continue current medications if able  Type 2 diabetes mellitus -Blood sugar control.  -Start on SSI. Last hemoglobin A1c 7.0.  -Takes Lantus only 6 units at homewhich is on hold here. -CBG's range from  95-162  Leukocytosis -Was mild mild with no signs of infection and she is afebrile. -WBC went from 10.4 -> 10.7 -> 11.0 -> 10.4 -> 10.7 -Blood Cx x2 showed NGTD at 5 Days  -CXR was Negative and showed no active Cardiopulmonary Disease  Acute on chronic anemia of chronic disease -Baseline hemoglobin between 8 and 9. Now Hb/Hct went from 7.6/24.2 -> 7.4/23.7 -> 1.6/24.4 -Transfuse if hemoglobin less than 7. -Today to monitor for signs and  symptoms of bleeding; currently no overt bleeding or -Repeat CBC within 1 week  Discharge Instructions Discharge Instructions    Call MD for:  difficulty breathing, headache or visual disturbances   Complete by: As directed    Call MD for:  extreme fatigue   Complete by: As directed    Call MD for:  hives   Complete by: As directed    Call MD for:  persistant dizziness or light-headedness   Complete by: As directed    Call MD for:  persistant nausea and  vomiting   Complete by: As directed    Call MD for:  redness, tenderness, or signs of infection (pain, swelling, redness, odor or green/yellow discharge around incision site)   Complete by: As directed    Call MD for:  severe uncontrolled pain   Complete by: As directed    Call MD for:  temperature >100.4   Complete by: As directed    Diet - low sodium heart healthy   Complete by: As directed    RENAL/CARB MODIFIED with 1200 mL FLUID RESTRICTION   Discharge instructions   Complete by: As directed    You were cared for by a hospitalist during your hospital stay. If you have any questions about your discharge medications or the care you received while you were in the hospital after you are discharged, you can call the unit and ask to speak with the hospitalist on call if the hospitalist that took care of you is not available. Once you are discharged, your primary care physician will handle any further medical issues. Please note that NO REFILLS for any discharge medications will be authorized once you are discharged, as it is imperative that you return to your primary care physician (or establish a relationship with a primary care physician if you do not have one) for your aftercare needs so that they can reassess your need for medications and monitor your lab values.  Follow up with PCP and Nephrology, and Psychiatry. Take all medications as prescribed. If symptoms change or worsen please return to the ED for evaluation   Increase activity slowly   Complete by: As directed      Allergies as of 11/25/2019      Reactions   Codone [hydrocodone] Itching   Norvasc [amlodipine Besylate] Swelling   Lower extremity   Hydralazine Swelling, Other (See Comments)   Leg swelling. Pt takes this as o/p   Sulfa Antibiotics Itching, Rash      Medication List    STOP taking these medications   amoxicillin-clavulanate 875-125 MG tablet Commonly known as: AUGMENTIN   ciprofloxacin 500 MG  tablet Commonly known as: CIPRO   escitalopram 20 MG tablet Commonly known as: LEXAPRO   insulin aspart 100 UNIT/ML injection Commonly known as: NovoLOG   metroNIDAZOLE 500 MG tablet Commonly known as: FLAGYL     TAKE these medications   Accu-Chek FastClix Lancets Misc Uad tid What changed:   how much to take  how to take this  when to take this  additional instructions   Accu-Chek Guide w/Device Kit 1 each by Does not apply route 3 (three) times daily. Use tid as directed   acetaminophen 325 MG tablet Commonly known as: Tylenol Take 2 tablets (650 mg total) by mouth every 6 (six) hours as needed. What changed: reasons to take this   albuterol 108 (90 Base) MCG/ACT inhaler Commonly known as: VENTOLIN HFA Inhale 1-2 puffs into the lungs every 6 (six) hours as  needed for wheezing or shortness of breath.   aspirin EC 81 MG tablet Take 1 tablet (81 mg total) by mouth daily.   atorvastatin 20 MG tablet Commonly known as: LIPITOR Take 1/2 (one-half) tablet by mouth once daily What changed: See the new instructions.   calcitRIOL 0.25 MCG capsule Commonly known as: ROCALTROL Take 0.25 mcg by mouth daily.   carvedilol 25 MG tablet Commonly known as: COREG TAKE 1 TABLET BY MOUTH TWICE DAILY WITH A MEAL What changed: See the new instructions.   citalopram 20 MG tablet Commonly known as: CELEXA Take 1 tablet (20 mg total) by mouth daily. Start taking on: November 26, 2019   feeding supplement (PRO-STAT SUGAR FREE 64) Liqd Take 30 mLs by mouth 2 (two) times daily.   furosemide 80 MG tablet Commonly known as: LASIX Take 1 tablet (80 mg total) by mouth daily.   gabapentin 100 MG capsule Commonly known as: NEURONTIN TAKE 1 CAPSULE (100 MG TOTAL) BY MOUTH AT BEDTIME.   gentamicin cream 0.1 % Commonly known as: GARAMYCIN Apply 1 application topically See admin instructions. Apply topically to dialysis site after showering - every other day   glucose blood test  strip Commonly known as: Accu-Chek Guide Use as instructed tid What changed:   how much to take  how to take this  when to take this  additional instructions   hydrALAZINE 100 MG tablet Commonly known as: APRESOLINE Take 1 tablet (100 mg total) by mouth 2 (two) times daily.   hydrOXYzine 25 MG tablet Commonly known as: ATARAX/VISTARIL Take 1 tablet (25 mg total) by mouth 3 (three) times daily as needed for anxiety.   Insulin Pen Needle 32G X 4 MM Misc Commonly known as: TRUEplus Pen Needles Use as directed to inject insulin. What changed:   how much to take  how to take this  when to take this  additional instructions   Insulin Pen Needle 31G X 5 MM Misc Use as directed What changed:   how much to take  how to take this  when to take this  additional instructions   isosorbide mononitrate 30 MG 24 hr tablet Commonly known as: IMDUR Take 1/2 (one-half) tablet by mouth once daily What changed: See the new instructions.   Lantus SoloStar 100 UNIT/ML Solostar Pen Generic drug: Insulin Glargine Inject 6 Units into the skin at bedtime.   lisinopril 40 MG tablet Commonly known as: ZESTRIL Take 40 mg by mouth daily.   multivitamin Tabs tablet Take 1 tablet by mouth at bedtime.   oxyCODONE 5 MG immediate release tablet Commonly known as: Oxy IR/ROXICODONE Take 1 tablet (5 mg total) by mouth every 4 (four) hours as needed for moderate pain or breakthrough pain (Hold & Call MD if SBP<90, HR<65, RR<10, O2<90, or altered mental status.).   promethazine 25 MG tablet Commonly known as: PHENERGAN Take 25 mg by mouth daily as needed for nausea.   sevelamer 800 MG tablet Commonly known as: RENAGEL Take 1,600-3,200 mg by mouth See admin instructions. Take 2-4 tablets (1600-3200 mg) by mouth up to three times daily with meals - 2-3 tablets with small meal, 4 tablets with large meal   spironolactone 25 MG tablet Commonly known as: ALDACTONE Take 25 mg by mouth  daily.   Systane 0.4-0.3 % Soln Generic drug: Polyethyl Glycol-Propyl Glycol Place 1 drop into both eyes 3 (three) times daily as needed (dry eyes).      Follow-up Information    [facility]  Follow up.   Specialty: Home Health Services Contact information: [address] [place] [address] [phone]          Allergies  Allergen Reactions  . Codone [Hydrocodone] Itching  . Norvasc [Amlodipine Besylate] Swelling    Lower extremity  . Hydralazine Swelling and Other (See Comments)    Leg swelling. Pt takes this as o/p  . Sulfa Antibiotics Itching and Rash    Consultations:  Nephrology  Interventional Radiology  Procedures/Studies: CT ABDOMEN PELVIS WO CONTRAST  Result Date: 11/02/2019 CLINICAL DATA:  Recent admission for diverticulitis. Persistent left lower quadrant pain. EXAM: CT ABDOMEN AND PELVIS WITHOUT CONTRAST TECHNIQUE: Multidetector CT imaging of the abdomen and pelvis was performed following the standard protocol without IV contrast. COMPARISON:  10/10/2019 and 10/19/2019 FINDINGS: Lower chest: Lung bases are clear. Hepatobiliary: Cholelithiasis unchanged. Liver and biliary tree are normal. Pancreas: Normal. Spleen: Normal. Adrenals/Urinary Tract: Adrenal glands are normal. Kidneys are normal in size without hydronephrosis or nephrolithiasis. Ureters and bladder are normal. Stomach/Bowel: Stomach and small bowel are normal. Appendix is not visualized. There is diverticulosis of the colon with mild adjacent inflammatory change over the junction of the descending to sigmoid colon in the left lower quadrant likely ongoing mild acute diverticulitis. No evidence of adjacent abscess or perforation. Vascular/Lymphatic: Mild-to-moderate calcified plaque over the abdominal aorta which is normal in caliber. No adenopathy. Reproductive: Previous hysterectomy. Other: Peritoneal dialysis catheter is present entering the peritoneal cavity adjacent the umbilicus with  fluid surrounding the segment of catheter over the anterior abdominal wall unchanged. Tip of the catheter is over the left lower quadrant. There is a moderate size umbilical hernia containing a few segments of small bowel which are normal in caliber. No evidence of obstruction. There is a small amount of free peritoneal air unchanged and compatible with patient's known peritoneal dialysis. Stable fat necrosis over the midline anterior omentum. Small amount of free pelvic fluid. Musculoskeletal: Stable grade 1 anterolisthesis of L4 on L5 with associated disc space narrowing at the L4-5 level. IMPRESSION: 1. Colonic diverticulosis with continued evidence of mild acute diverticulitis over the left lower quadrant at the junction of the distal descending to sigmoid colon. No perforation or abscess. 2. Peritoneal dialysis catheter unchanged. Stable tiny amount of free peritoneal air compatible patient's peritoneal dialysis. Collection of fluid along the segment catheter within the anterior abdominal wall unchanged. 3. Moderate size umbilical hernia containing a few segments of small bowel without evidence of obstruction/incarceration. Stable fat necrosis over the midline omentum. 4.  Cholelithiasis. 5.  Aortic Atherosclerosis (ICD10-I70.0). Electronically Signed   By: Marin Olp M.D.   On: 11/02/2019 15:11   DG Chest 2 View  Result Date: 11/18/2019 CLINICAL DATA:  Abdominal pain, weakness EXAM: CHEST - 2 VIEW COMPARISON:  02/09/2019 FINDINGS: Heart and mediastinal contours are within normal limits. No focal opacities or effusions. No acute bony abnormality. IMPRESSION: No active cardiopulmonary disease. Electronically Signed   By: Rolm Baptise M.D.   On: 11/18/2019 17:14   CT Head Wo Contrast  Result Date: 11/18/2019 CLINICAL DATA:  Encephalopathy.  Altered mental status. EXAM: CT HEAD WITHOUT CONTRAST TECHNIQUE: Contiguous axial images were obtained from the base of the skull through the vertex without  intravenous contrast. COMPARISON:  None. FINDINGS: Brain: No acute intracranial abnormality. Specifically, no hemorrhage, hydrocephalus, mass lesion, acute infarction, or significant intracranial injury. Vascular: No hyperdense vessel or unexpected calcification. Skull: No acute calvarial abnormality. Sinuses/Orbits: Visualized paranasal sinuses and mastoids clear. Orbital soft  tissues unremarkable. Other: None IMPRESSION: No acute intracranial abnormality. Electronically Signed   By: Rolm Baptise M.D.   On: 11/18/2019 17:22   IR Fluoro Guide CV Line Right  Result Date: 11/23/2019 INDICATION: 64 year old female with a history of end-stage renal disease. EXAM: TUNNELED CENTRAL VENOUS HEMODIALYSIS CATHETER PLACEMENT WITH ULTRASOUND AND FLUOROSCOPIC GUIDANCE MEDICATIONS: 2 g Ancef. The antibiotic was given in an appropriate time interval prior to skin puncture. ANESTHESIA/SEDATION: Moderate (conscious) sedation was employed during this procedure. A total of Versed 1.0 mg and Fentanyl 100 mcg was administered intravenously. Moderate Sedation Time: 13 minutes. The patient's level of consciousness and vital signs were monitored continuously by radiology nursing throughout the procedure under my direct supervision. FLUOROSCOPY TIME:  Fluoroscopy Time: 0 minutes 48 seconds (7 mGy). COMPLICATIONS: None PROCEDURE: Informed written consent was obtained from the patient after a discussion of the risks, benefits, and alternatives to treatment. Questions regarding the procedure were encouraged and answered. The right neck and chest were prepped with chlorhexidine in a sterile fashion, and a sterile drape was applied covering the operative field. Maximum barrier sterile technique with sterile gowns and gloves were used for the procedure. A timeout was performed prior to the initiation of the procedure. Wire was passed through the indwelling temporary right IJ hemodialysis catheter with internal estimate of length performed. A  total tip to cuff length of 19 cm was selected. Bentson wire was passed into the IVC. Catheter was removed from the Bentson wire. Skin and subcutaneous tissues of chest wall below the clavicle were generously infiltrated with 1% lidocaine for local anesthesia. A small stab incision was made with 11 blade scalpel. The selected hemodialysis catheter was tunneled in a retrograde fashion from the anterior chest wall to the venotomy incision. Peel-away sheath was placed. The catheter was then placed through the peel-away sheath with tips ultimately positioned within the superior aspect of the right atrium. Final catheter positioning was confirmed and documented with a spot radiographic image. The catheter aspirates and flushes normally. The catheter was flushed with appropriate volume heparin dwells. The catheter exit site was secured with a 0-Prolene retention suture. The venotomy incision was closed Derma bond and sterile dressing. Dressings were applied at the chest wall. Patient tolerated the procedure well and remained hemodynamically stable throughout. No complications were encountered and no significant blood loss encountered. IMPRESSION: Status post placement of a tunneled right IJ 19 cm tip to cuff hemodialysis catheter. Signed, Dulcy Fanny. Dellia Nims, RPVI [facility] Radiology Electronically Signed   By: Corrie Mckusick D.O.   On: 11/23/2019 17:11   IR Fluoro Guide CV Line Right  Result Date: 11/20/2019 INDICATION: ENCEPHALOPATHY, END-STAGE RENAL DISEASE, NO CURRENT ACCESS FOR DIALYSIS EXAM: ULTRASOUND GUIDANCE FOR VASCULAR ACCESS RIGHT IJ TEMPORARY DIALYSIS CATHETER (16 CM MAHURKAR CATHETER) MEDICATIONS: 1% lidocaine local ANESTHESIA/SEDATION: Moderate Sedation Time: None. The patient's level of consciousness and vital signs were monitored continuously by radiology nursing throughout the procedure under my direct supervision. FLUOROSCOPY TIME:  Fluoroscopy Time:  0 minutes 24 seconds ( mGy). COMPLICATIONS: None immediate. PROCEDURE: Informed written consent was obtained from the patient's family after a thorough discussion of the procedural risks, benefits and alternatives. All questions were addressed. Maximal Sterile Barrier Technique was utilized including caps, mask, sterile gowns, sterile gloves, sterile drape, hand hygiene and skin antiseptic. A timeout was performed prior to the initiation of the procedure. Under sterile conditions and local anesthesia, ultrasound micropuncture access performed of the right internal jugular vein. Images  obtained for documentation of the patent right internal jugular vein. Guidewire advanced easily under fluoroscopy followed by the micro dilator set. Amplatz guidewire inserted followed by tract dilatation to insert a 16 cm Mahurkar temporary dialysis catheter. Tip SVC RA junction. Position confirmed with fluoroscopy. Images obtained for documentation. Blood aspirated easily followed by saline and heparin flushes. Appropriate volume and strength of heparin instilled in all 3 lumens followed by external caps. Catheter secured Prolene sutures and a sterile dressing. No immediate complication. Patient tolerated the procedure well. IMPRESSION: Successful ultrasound and fluoroscopic right IJ temporary dialysis catheter (16 cm Mahurkar catheter). Tip SVC RA junction. Ready for use. Electronically Signed   By: Jerilynn Mages.  Shick M.D.   On: 11/20/2019 15:33   IR US Guide Vasc Access Right  Result Date: 11/20/2019 INDICATION: ENCEPHALOPATHY, END-STAGE RENAL DISEASE, NO CURRENT ACCESS FOR DIALYSIS EXAM: ULTRASOUND GUIDANCE FOR VASCULAR ACCESS RIGHT IJ TEMPORARY DIALYSIS CATHETER (16 CM MAHURKAR CATHETER) MEDICATIONS: 1% lidocaine local ANESTHESIA/SEDATION: Moderate Sedation Time: None. The patient's level of consciousness and vital signs were monitored continuously by radiology nursing throughout the procedure under my direct supervision. FLUOROSCOPY  TIME:  Fluoroscopy Time: 0 minutes 24 seconds ( mGy). COMPLICATIONS: None immediate. PROCEDURE: Informed written consent was obtained from the patient's family after a thorough discussion of the procedural risks, benefits and alternatives. All questions were addressed. Maximal Sterile Barrier Technique was utilized including caps, mask, sterile gowns, sterile gloves, sterile drape, hand hygiene and skin antiseptic. A timeout was performed prior to the initiation of the procedure. Under sterile conditions and local anesthesia, ultrasound micropuncture access performed of the right internal jugular vein. Images obtained for documentation of the patent right internal jugular vein. Guidewire advanced easily under fluoroscopy followed by the micro dilator set. Amplatz guidewire inserted followed by tract dilatation to insert a 16 cm Mahurkar temporary dialysis catheter. Tip SVC RA junction. Position confirmed with fluoroscopy. Images obtained for documentation. Blood aspirated easily followed by saline and heparin flushes. Appropriate volume and strength of heparin instilled in all 3 lumens followed by external caps. Catheter secured Prolene sutures and a sterile dressing. No immediate complication. Patient tolerated the procedure well. IMPRESSION: Successful ultrasound and fluoroscopic right IJ temporary dialysis catheter (16 cm Mahurkar catheter). Tip SVC RA junction. Ready for use. Electronically Signed   By: Jerilynn Mages.  Shick M.D.   On: 11/20/2019 15:33    Subjective: Seen and examined at bedside and she is doing fairly well and is back to baseline.  She is sitting in the chair at bedside and had no complaints or concerns.  She is awake and alert and oriented x3 and apologized for her behavior a few days ago.  No nausea or vomiting.  No other concerns or complaints at this time and is stable for discharge after dialysis.   Discharge Exam: Vitals:   11/25/19 1730 11/25/19 1756  BP: (!) 144/75 (!) 167/71  Pulse: 93  97  Resp: 18 16  Temp: 98.2 F (36.8 C) 98.7 F (37.1 C)  SpO2: 98% 100%   Vitals:   11/25/19 1701 11/25/19 1710 11/25/19 1730 11/25/19 1756  BP: (!) 145/77 (!) 145/77 (!) 144/75 (!) 167/71  Pulse: 93 93 93 97  Resp:   18 16  Temp:   98.2 F (36.8 C) 98.7 F (37.1 C)  TempSrc:   Oral Oral  SpO2:   98% 100%  Weight:   64 kg   Height:       General: Pt is alert, awake, not in acute distress  Cardiovascular: RRR, S1/S2 +, no rubs, no gallops Respiratory: Slightly diminished bilaterally, no wheezing, no rhonchi Abdominal: Soft, NT, ND, bowel sounds + Extremities: no edema, no cyanosis  The results of significant diagnostics from this hospitalization (including imaging, microbiology, ancillary and laboratory) are listed below for reference.    Microbiology: Recent Results (from the past 240 hour(s))  SARS CORONAVIRUS 2 (TAT 6-24 HRS) Nasopharyngeal Nasopharyngeal Swab     Status: None   Collection Time: 11/18/19  7:00 PM   Specimen: Nasopharyngeal Swab  Result Value Ref Range Status   SARS Coronavirus 2 NEGATIVE NEGATIVE Final    Comment: (NOTE) SARS-CoV-2 target nucleic acids are NOT DETECTED. The SARS-CoV-2 RNA is generally detectable in upper and lower respiratory specimens during the acute phase of infection. Negative results do not preclude SARS-CoV-2 infection, do not rule out co-infections with other pathogens, and should not be used as the sole basis for treatment or other patient management decisions. Negative results must be combined with clinical observations, patient history, and epidemiological information. The expected result is Negative. Fact Sheet for Patients: SugarRoll.be Fact Sheet for Healthcare Providers: https://www.woods-mathews.com/ This test is not yet approved or cleared by the [place] FDA and  has been authorized for detection and/or diagnosis of SARS-CoV-2 by FDA under an Emergency Use Authorization  (EUA). This EUA will remain  in effect (meaning this test can be used) for the duration of the COVID-19 declaration under Section 56 4(b)(1) of the Act, 21 U.S.C. section 360bbb-3(b)(1), unless the authorization is terminated or revoked sooner. Performed at [facility], [place] [address] [place], [place] [address]   Blood culture (routine x 2)     Status: None   Collection Time: 11/18/19  7:56 PM   Specimen: BLOOD RIGHT HAND  Result Value Ref Range Status   Specimen Description BLOOD RIGHT HAND  Final   Special Requests   Final    BOTTLES DRAWN AEROBIC AND ANAEROBIC Blood Culture results may not be optimal due to an inadequate volume of blood received in culture bottles Performed at [facility], [address] [place], [place] [address]    Culture   Final    NO GROWTH 5 DAYS Performed at [facility], [place] [address], [place], [place] [address]    Report Status 11/23/2019 FINAL  Final  Blood culture (routine x 2)     Status: None   Collection Time: 11/18/19  8:05 PM   Specimen: BLOOD LEFT HAND  Result Value Ref Range Status   Specimen Description BLOOD LEFT HAND  Final   Special Requests   Final    BOTTLES DRAWN AEROBIC AND ANAEROBIC Blood Culture adequate volume Performed at [facility], [address] [place], [place] [address]    Culture   Final    NO GROWTH 5 DAYS Performed at [facility], [place] [address], [place], [place] [address]    Report Status 11/23/2019 FINAL  Final  SARS Coronavirus 2 by RT PCR (hospital order, performed in [facility] hospital lab) Nasopharyngeal Nasopharyngeal Swab     Status: None   Collection Time: 11/18/19  9:50 PM   Specimen: Nasopharyngeal Swab  Result Value Ref Range Status   SARS Coronavirus 2 NEGATIVE NEGATIVE Final    Comment: Performed at [facility], [address] [place], [place] [address]    Labs: BNP (last 3 results) Recent Labs    12/05/18 0155  BNP 1,901.1*  Basic  Metabolic Panel: Recent Labs  Lab 11/21/19 0410 11/22/19 1345 11/23/19 0400 11/24/19 0515 11/25/19 0452  NA 140 136 139 140 136  K 3.8 3.4* 3.8 3.7 3.9  CL 103 100 102 104 100  CO2 23 25 26 24 26   GLUCOSE 91 99 93 98 122*  BUN 52* 28* 20 29* 8  CREATININE 7.33* 4.54* 4.19* 5.56* 3.38*  CALCIUM 8.5* 8.2* 8.4* 8.7* 8.1*  MG 2.7* 2.2 2.1 2.2 1.8  PHOS 4.5 3.5 4.2 5.0* 2.6   Liver Function Tests: Recent Labs  Lab 11/21/19 0410 11/22/19 1345 11/23/19 0400 11/24/19 0515 11/25/19 0452  AST 14* 20 20 17 16   ALT 12 12 12 7 5   ALKPHOS 44 37* 41 40 43  BILITOT 0.6 0.9 0.7 0.4 0.3  PROT 5.6* 5.1* 5.7* 5.5* 5.8*  ALBUMIN 1.9* 1.9* 1.9* 1.9* 2.0*   No results for input(s): LIPASE, AMYLASE in the last 168 hours. No results for input(s): AMMONIA in the last 168 hours. CBC: Recent Labs  Lab 11/20/19 0451 11/20/19 0451 11/21/19 0410 11/22/19 1253 11/23/19 0400 11/24/19 0515 11/25/19 0452  WBC 11.6*   < > 10.4 10.7* 11.0* 10.4 10.7*  NEUTROABS 8.9*  --  6.6  --  6.7 6.3 5.9  HGB 7.6*   < > 7.8* 7.9* 7.6* 7.4* 7.6*  HCT 23.2*   < > 24.0* 25.0* 24.2* 23.7* 24.4*  MCV 92.8   < > 94.1 96.9 98.8 98.8 97.6  PLT 165   < > 194 198 163 174 201   < > = values in this interval not displayed.   Cardiac Enzymes: No results for input(s): CKTOTAL, CKMB, CKMBINDEX, TROPONINI in the last 168 hours. BNP: Invalid input(s): POCBNP CBG: Recent Labs  Lab 11/24/19 2026 11/25/19 0722 11/25/19 0807 11/25/19 1116 11/25/19 1755  GLUCAP 133* 126* 162* 135* 95   D-Dimer No results for input(s): DDIMER in the last 72 hours. Hgb A1c No results for input(s): HGBA1C in the last 72 hours. Lipid Profile No results for input(s): CHOL, HDL, LDLCALC, TRIG, CHOLHDL, LDLDIRECT in the last 72 hours. Thyroid function studies No results for input(s): TSH, T4TOTAL, T3FREE, THYROIDAB in the last 72 hours.  Invalid input(s): FREET3 Anemia work up Recent Labs    11/25/19 0452  FERRITIN 1,490*  TIBC  125*  IRON 30   Urinalysis    Component Value Date/Time   COLORURINE YELLOW 11/19/2019 [place] 11/19/2019 0035   LABSPEC 1.015 11/19/2019 0035   PHURINE 6.5 11/19/2019 0035   GLUCOSEU >=500 (A) 11/19/2019 0035   HGBUR NEGATIVE 11/19/2019 0035   BILIRUBINUR NEGATIVE 11/19/2019 0035   BILIRUBINUR 1.0 10/09/2016 1306   KETONESUR NEGATIVE 11/19/2019 0035   PROTEINUR >300 (A) 11/19/2019 0035   UROBILINOGEN 0.2 04/24/2018 1529   NITRITE NEGATIVE 11/19/2019 0035   LEUKOCYTESUR NEGATIVE 11/19/2019 0035   Sepsis Labs Invalid input(s): PROCALCITONIN,  WBC,  LACTICIDVEN Microbiology Recent Results (from the past 240 hour(s))  SARS CORONAVIRUS 2 (TAT 6-24 HRS) Nasopharyngeal Nasopharyngeal Swab     Status: None   Collection Time: 11/18/19  7:00 PM   Specimen: Nasopharyngeal Swab  Result Value Ref Range Status   SARS Coronavirus 2 NEGATIVE NEGATIVE Final    Comment: (NOTE) SARS-CoV-2 target nucleic acids are NOT DETECTED. The SARS-CoV-2 RNA is generally detectable in upper and lower respiratory specimens during the acute phase of infection. Negative results do not preclude SARS-CoV-2 infection, do not rule out co-infections with other pathogens, and should not be  used as the sole basis for treatment or other patient management decisions. Negative results must be combined with clinical observations, patient history, and epidemiological information. The expected result is Negative. Fact Sheet for Patients: SugarRoll.be Fact Sheet for Healthcare Providers: https://www.woods-mathews.com/ This test is not yet approved or cleared by the [place] FDA and  has been authorized for detection and/or diagnosis of SARS-CoV-2 by FDA under an Emergency Use Authorization (EUA). This EUA will remain  in effect (meaning this test can be used) for the duration of the COVID-19 declaration under Section 56 4(b)(1) of the Act, 21  U.S.C. section 360bbb-3(b)(1), unless the authorization is terminated or revoked sooner. Performed at [facility], [place] [address], [place], [place] [address]   Blood culture (routine x 2)     Status: None   Collection Time: 11/18/19  7:56 PM   Specimen: BLOOD RIGHT HAND  Result Value Ref Range Status   Specimen Description BLOOD RIGHT HAND  Final   Special Requests   Final    BOTTLES DRAWN AEROBIC AND ANAEROBIC Blood Culture results may not be optimal due to an inadequate volume of blood received in culture bottles Performed at [facility], [place] [place], [place] [address]    Culture   Final    NO GROWTH 5 DAYS Performed at [facility], [place] [address], [place], [place] [address]    Report Status 11/23/2019 FINAL  Final  Blood culture (routine x 2)     Status: None   Collection Time: 11/18/19  8:05 PM   Specimen: BLOOD LEFT HAND  Result Value Ref Range Status   Specimen Description BLOOD LEFT HAND  Final   Special Requests   Final    BOTTLES DRAWN AEROBIC AND ANAEROBIC Blood Culture adequate volume Performed at [facility], [place] [place], [place] [address]    Culture   Final    NO GROWTH 5 DAYS Performed at [facility], [place] [address], [place], [place] [address]    Report Status 11/23/2019 FINAL  Final  SARS Coronavirus 2 by RT PCR (hospital order, performed in [facility] hospital lab) Nasopharyngeal Nasopharyngeal Swab     Status: None   Collection Time: 11/18/19  9:50 PM   Specimen: Nasopharyngeal Swab  Result Value Ref Range Status   SARS Coronavirus 2 NEGATIVE NEGATIVE Final    Comment: Performed at [facility], [address] [place], [place] [address]   Time coordinating discharge: 35 minutes  SIGNED:  Kerney Elbe, DO [facility] 11/25/2019, 7:19 PM Pager is on AMION  If 7PM-7AM, please contact night-coverage [email or web address] Password [email or web address]

## 2019-11-25 NOTE — TOC Transition Note (Signed)
Transition of Care [facility]) - CM/SW Discharge Note   Patient Details  Name: TERIANNE THAKER MRN: [id number] Date of Birth: 1955-10-15  [facility]) CM/SW Contact:  Carles Collet, RN Phone Number: 11/25/2019, 1:43 PM   Clinical Narrative:   Notified [facility] that patient will DC today.   Final next level of care: [place] Barriers to Discharge: Continued Medical Work up   Patient Goals and CMS Choice Patient states their goals for this hospitalization and ongoing recovery are:: Per daughter, the goal is for patient to get better medically and return home; and there family will look after her and take her to dialysis CMS Medicare.gov Compare Post Acute Care list provided to:: Patient Represenative (must comment)(daughter, Rynesha) Choice offered to / list presented to : Adult Children  Discharge Placement                       Discharge Plan and Services In-house Referral: Clinical Social Work Discharge Planning Services: CM Consult Post Acute Care Choice: Home Health                    HH Arranged: PT, OT [place] Agency: [facility] ([facility] [employer]) Date [place]: 11/24/19 Time [place]: 1640 Representative spoke with at [place]: [place] ([place]) Interventions     Readmission Risk Interventions No flowsheet data found.

## 2019-11-25 NOTE — Progress Notes (Signed)
[facility] Progress Note   Subjective:  Seen in room. Conversant,did well with dialysis yesterday. No complaints today. Has seat at outpatient center.   Objective Vitals:   11/24/19 2023 11/24/19 2104 11/25/19 0440 11/25/19 0830  BP: 127/64  128/68 (!) 147/66  Pulse: 86  90 (!) 103  Resp: 16  16 18   Temp: 98.7 F (37.1 C)  98.2 F (36.8 C) 98.6 F (37 C)  TempSrc: Oral  Oral Oral  SpO2: 96%  97% 100%  Weight:  62.1 kg    Height:        Weight change: 1.1 kg   Additional Objective Labs: Basic Metabolic Panel: Recent Labs  Lab 11/23/19 0400 11/24/19 0515 11/25/19 0452  NA 139 140 136  K 3.8 3.7 3.9  CL 102 104 100  CO2 26 24 26   GLUCOSE 93 98 122*  BUN 20 29* 8  CREATININE 4.19* 5.56* 3.38*  CALCIUM 8.4* 8.7* 8.1*  PHOS 4.2 5.0* 2.6   CBC: Recent Labs  Lab 11/21/19 0410 11/21/19 0410 11/22/19 1253 11/22/19 1253 11/23/19 0400 11/24/19 0515 11/25/19 0452  WBC 10.4   < > 10.7*   < > 11.0* 10.4 10.7*  NEUTROABS 6.6   < >  --   --  6.7 6.3 5.9  HGB 7.8*   < > 7.9*   < > 7.6* 7.4* 7.6*  HCT 24.0*   < > 25.0*   < > 24.2* 23.7* 24.4*  MCV 94.1  --  96.9  --  98.8 98.8 97.6  PLT 194   < > 198   < > 163 174 201   < > = values in this interval not displayed.   Blood Culture    Component Value Date/Time   SDES BLOOD LEFT HAND 11/18/2019 2005   SPECREQUEST  11/18/2019 2005    BOTTLES DRAWN AEROBIC AND ANAEROBIC Blood Culture adequate volume Performed at [facility], [address], [place], [place] [address]    CULT  11/18/2019 2005    NO GROWTH 5 DAYS Performed at [facility], [place] [address] [place], [place] [address]    REPTSTATUS 11/23/2019 FINAL 11/18/2019 2005     Physical Exam General: Alert, conversant,nad  Heart: RRR  Lungs: Clear bilaterally  Abdomen: soft, nontender: PD cath in place  Extremities: No LE edema  Dialysis Access: R IJ TDC in place   Medications: . sodium chloride Stopped (11/18/19 2357)   . sodium chloride     . aspirin EC  81 mg Oral Daily  . atorvastatin  10 mg Oral q1800  . calcitRIOL  0.25 mcg Oral Daily  . carvedilol  25 mg Oral BID WC  . Chlorhexidine Gluconate Cloth  6 each Topical Q0600  . citalopram  20 mg Oral Daily  . [START ON 11/29/2019] darbepoetin (ARANESP) injection - DIALYSIS  100 mcg Intravenous Q Wed-HD  . feeding supplement (PRO-STAT SUGAR FREE 64)  30 mL Oral BID  . gabapentin  100 mg Oral QHS  . heparin  5,000 Units Subcutaneous Q8H  . hydrALAZINE  100 mg Oral Q8H  . insulin aspart  0-15 Units Subcutaneous TID WC  . insulin aspart  0-5 Units Subcutaneous QHS  . isosorbide mononitrate  15 mg Oral Daily  . lisinopril  40 mg Oral Daily  . multivitamin  1 tablet Oral QHS  . sevelamer carbonate  800 mg Oral TID WC  . sodium chloride flush  10-40 mL Intracatheter Q12H  .  sodium chloride flush  3 mL Intravenous Q12H  . sodium chloride flush  3 mL Intravenous Q12H  . spironolactone  25 mg Oral Daily    Dialysis Orders:  CCPD 7xwk, EDW 68.5kg 5 exchanges Fill Vol 2.4L, Dwell time 1.5hr No last fill/day exchange  Assessment/Plan: 1. AMS - CT head negative. Blood cultures /UA neg.  Now felt most likely related to uremia as MS is much improved after HD x 3.  Will do short HD today to get on TTS schedule.  2. Hypokalemia - Resolving/ Using 4K bath on hd 3. ESRD- PD> now transitioning toHD. [place] placed 2/11. CLIP to [facility] TTS 2nd shift. The plan was to convert from PD to HD and then undergo abdominal surgery by Dr. Lorelle Gibbs at [facility]. Apparently this surgery has been cancelled.  4.  Hypertension/volume- BP up some. Not grossly overloaded on exam. UF 1-2L as tolerated.  5. Anemiaof CKD- Hgb 7.6. Received Aranesp 40 on 2/10. Increase to 100 q week. Tsat 24% Ferritin 1490 6. Secondary Hyperparathyroidism -Ca/Phos ok. Continue Renvelabinders. Not onVDRA 7. Nutrition- Alb 2.  (ho PD contributing to some decr   Alb)  Renal diet/prot supp.   8. Dispo: short HD today to get on TTS schedule. From renal standpoint --should be ok for discharge after HD  Lynnda Child PA-C [place] Pager [phone] 11/25/2019,9:47 AM  LOS: 6 days

## 2019-11-26 ENCOUNTER — Telehealth: Payer: Self-pay | Admitting: Nephrology

## 2019-11-26 DIAGNOSIS — Z992 Dependence on renal dialysis: Secondary | ICD-10-CM | POA: Diagnosis not present

## 2019-11-26 DIAGNOSIS — N186 End stage renal disease: Secondary | ICD-10-CM | POA: Diagnosis not present

## 2019-11-26 DIAGNOSIS — N2581 Secondary hyperparathyroidism of renal origin: Secondary | ICD-10-CM | POA: Diagnosis not present

## 2019-11-26 NOTE — Telephone Encounter (Signed)
Transition of care contact from inpatient facility  Date of discharge: 11/25/19 Date of contact: 11/26/19 Method: Phone Spoke to: Patient's daughter   Patient contacted to discuss transition of care from recent inpatient hospitalization. Patient was admitted to [facility] from 11/18/19 to 11/25/19  with discharge diagnosis of acute encephalopathy with agitation.   Medication changes were reviewed.  Patient will follow up with his/her outpatient HD unit on: Tuesday 2/16  Other f/u needs include: None at this time

## 2019-11-27 ENCOUNTER — Telehealth: Payer: Self-pay

## 2019-11-27 NOTE — Telephone Encounter (Signed)
Transition Care Management Follow-up Telephone Call  Call was completed with patient's daughter, Oletta Lamas.   DPR on file to speak with her. Patient was with Rynesha at the time of this call.   Date of discharge and from where: 11/25/2019, [facility]  How have you been since you were released from the hospital? *Rynesha said that her mother is " feeling better."   Any questions or concerns? They do not have the Pro-Stat feeding supplement. She will ask at HD tomorrow about where to obtain it.     Items Reviewed:  Did the pt receive and understand the discharge instructions provided? Yes she has the instructions. Questions regarding medications are noted below.  Medications obtained and verified? They have the medication list, and all medications including the new ones and the lantus. She is aware of the medications that have been discontinued.  She said that her mother has not been taking the lantus and was questioning why her mother does not have any  Glipizide ordered.  Explained that as per her discharge instructions, she should be taking the lantus and there is no order for   The patient then noted that she was receiving the insulin in the hospital .  Rynesha said there she has been giving her mother the hydroxyzine three times daily and feels that it is helping with the anxiety.  She said that her mother's speech was a little slurred yesterday after giving her the hydroxyzine and she is wondering if it may be due to the medication.  There are no problems with her speech at this time. Informed her that Dr Wynetta Emery would be notified of her observation.   Any new allergies since your discharge? None reported.   Dietary orders reviewed? {she said that her mother is very good about adhering to the 1200 ml /day fluid restriction.  Do you have support at home? Yes, her daughters  Other (ie: DME, Home Health, etc) referral was made to [facility].  They had not heard from anyone yet, so  Rynesha plans to call today to check status of home visit.   Has glucometer. Daughter checks patient's blood sugars. Last night, blood sugar was 147. She has not checked this morning.  She will attend HD T/T/S at [facility] - second shift.  Daughters to provide transportation.   Functional Questionnaire: (I = Independent and D = Dependent) ADL's: daughters assist as needed.  No assistive device.  Rynesha said that the patient will hold on to her when ambulating.    Follow up appointments reviewed:    PCP Hospital f/u appt confirmed? Appointment scheduled with Dr Wynetta Emery 12/04/2019 @ 1050.  Patient prefers televisit.   [facility] f/u appt confirmed? Daughter needs to schedule follow up with nephrology. AVS notes to follow up with psychiatry but patient's daughter was not sure about that.  Explained to her that Christa See, LCSW is available at [facility] to discuss further if she wishes.   Are transportation arrangements needed? Daughters provide transportation.   If their condition worsens, is the pt aware to call  their PCP or go to the ED?yes  Was the patient provided with contact information for the PCP's office or ED?the patient has the number programed in her phone   Was the pt encouraged to call back with questions or concerns? Yes, patient's daughter was encouraged to call back.

## 2019-11-28 DIAGNOSIS — Z992 Dependence on renal dialysis: Secondary | ICD-10-CM | POA: Diagnosis not present

## 2019-11-28 DIAGNOSIS — N2581 Secondary hyperparathyroidism of renal origin: Secondary | ICD-10-CM | POA: Diagnosis not present

## 2019-11-28 DIAGNOSIS — N186 End stage renal disease: Secondary | ICD-10-CM | POA: Diagnosis not present

## 2019-11-30 DIAGNOSIS — Z992 Dependence on renal dialysis: Secondary | ICD-10-CM | POA: Diagnosis not present

## 2019-11-30 DIAGNOSIS — N2581 Secondary hyperparathyroidism of renal origin: Secondary | ICD-10-CM | POA: Diagnosis not present

## 2019-11-30 DIAGNOSIS — N186 End stage renal disease: Secondary | ICD-10-CM | POA: Diagnosis not present

## 2019-12-02 DIAGNOSIS — N2581 Secondary hyperparathyroidism of renal origin: Secondary | ICD-10-CM | POA: Diagnosis not present

## 2019-12-02 DIAGNOSIS — Z992 Dependence on renal dialysis: Secondary | ICD-10-CM | POA: Diagnosis not present

## 2019-12-02 DIAGNOSIS — N186 End stage renal disease: Secondary | ICD-10-CM | POA: Diagnosis not present

## 2019-12-04 ENCOUNTER — Ambulatory Visit: Payer: Medicare HMO | Attending: Internal Medicine | Admitting: Internal Medicine

## 2019-12-04 ENCOUNTER — Other Ambulatory Visit: Payer: Self-pay

## 2019-12-04 DIAGNOSIS — F419 Anxiety disorder, unspecified: Secondary | ICD-10-CM

## 2019-12-04 DIAGNOSIS — Z09 Encounter for follow-up examination after completed treatment for conditions other than malignant neoplasm: Secondary | ICD-10-CM | POA: Diagnosis not present

## 2019-12-04 DIAGNOSIS — Z992 Dependence on renal dialysis: Secondary | ICD-10-CM

## 2019-12-04 DIAGNOSIS — N186 End stage renal disease: Secondary | ICD-10-CM

## 2019-12-04 DIAGNOSIS — D631 Anemia in chronic kidney disease: Secondary | ICD-10-CM | POA: Diagnosis not present

## 2019-12-04 DIAGNOSIS — I120 Hypertensive chronic kidney disease with stage 5 chronic kidney disease or end stage renal disease: Secondary | ICD-10-CM | POA: Diagnosis not present

## 2019-12-04 DIAGNOSIS — Z794 Long term (current) use of insulin: Secondary | ICD-10-CM

## 2019-12-04 DIAGNOSIS — F329 Major depressive disorder, single episode, unspecified: Secondary | ICD-10-CM | POA: Diagnosis not present

## 2019-12-04 DIAGNOSIS — E1122 Type 2 diabetes mellitus with diabetic chronic kidney disease: Secondary | ICD-10-CM | POA: Diagnosis not present

## 2019-12-04 DIAGNOSIS — R269 Unspecified abnormalities of gait and mobility: Secondary | ICD-10-CM

## 2019-12-04 MED ORDER — HYDRALAZINE HCL 25 MG PO TABS: 25.0000 mg | ORAL_TABLET | Freq: Two times a day (BID) | ORAL | 5 refills | Status: AC

## 2019-12-04 MED ORDER — NOVOLOG FLEXPEN 100 UNIT/ML ~~LOC~~ SOPN: PEN_INJECTOR | SUBCUTANEOUS | 11 refills | Status: AC

## 2019-12-04 MED ORDER — CITALOPRAM HYDROBROMIDE 20 MG PO TABS: 20.0000 mg | ORAL_TABLET | Freq: Every day | ORAL | 1 refills | Status: AC

## 2019-12-04 MED ORDER — HYDROXYZINE HCL 25 MG PO TABS: 25.0000 mg | ORAL_TABLET | Freq: Every day | ORAL | 3 refills | Status: AC | PRN

## 2019-12-04 NOTE — Progress Notes (Signed)
Pt states her blood sugar this morning was 149   Pt is needing a refill on glipizide  Pt is needing home health

## 2019-12-04 NOTE — Progress Notes (Signed)
Virtual Visit via Telephone Note Due to current restrictions/limitations of in-office visits due to the COVID-19 pandemic, this scheduled clinical appointment was converted to a telehealth visit  I connected with Patricia [name] on 12/04/19 at 10:13 a.m  by telephone and verified that I am speaking with the correct person using two identifiers. I am in my office.  The patient is at home.  Only the patient, her daughter and myself participated in this encounter.  I discussed the limitations, risks, security and privacy concerns of performing an evaluation and management service by telephone and the availability of in person appointments. I also discussed with the patient that there may be a patient responsible charge related to this service. The patient expressed understanding and agreed to proceed.   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  Patient last evaluated by me 06/2019.  Purpose of today's visit is transition of care for hospital follow-up.  Patient had requested a telemetry visit instead of in person.  Since I last saw her.  Patient underwent laparoscopic repair of incarcerated umbilical hernia 03/3784.  She was admitted in January with left lower quadrant abdominal pain due to proximal sigmoid diverticulitis without abscess.  She was also found to have continued presence of moderate-sized umbilical hernia which contained loops of small bowel on CAT scan without evidence of incarceration or obstruction.  Patient had seen a surgeon at [facility] and plan is for resection of part of the colon and repair of abdominal hernia.  Date of admission 11/18/2019 Date of discharge 11/25/2019 Phone conversation with case worker 11/27/2019  Patient hospitalized with change in mental status due to  encephalopathy.  Patient on peritoneal dialysis but had not done dialysis in 3 days.  CT of the head showed no acute abnormalities.  She received hemodialysis while in hospital.  Tunnel dialysis catheter placed.  Initial bun/creatinine 52/7.23.  By time of discharge it was 8/3.38.  Plan is to convert her to hemodialysis until she has had abdominal surgery.  She will be going to HD on Tue/Thur/Sat.  Noted to have acute on chronic anemia with drop in hemoglobin into the sevens.  No signs of overt bleeding.  She was not transfused. Hospital course also complicated by severe depression and agitation.  She was seen by psychiatry and was started on Celexa 10 mg that was subsequently increased to 20 mg.  Patient given Ativan to decrease combativeness but this made her very somnolent.  Once the Ativan wore off she was back to her baseline.  Today: Daughter reports that she is doing better.  However she has noted that the patient has some problems with word finding since being placed on hydroxyzine and Celexa.  She is prescribed hydroxyzine 25 mg 3 times a day as needed.  She has been taking it twice a day. -Patient reports that the medications help some but she still gets nervous and worried about her life and her medical conditions. -Refer to behavioral health last year.  She had spoken with an LCSW, Patricia [name], 07/2019.  ESRD/HTN: Plan is for HD short-term until she has had abdominal surgery which her daughter tells me is scheduled for 12/11/2019.  Blood pressure this morning was 175/77.  She was discharged home on carvedilol, hydralazine 100 mg twice a day, furosemide, spironolactone, isosorbide, lisinopril 40 mg.  However patient tells  me that they were all discontinued except the carvedilol by the nephrologist at the hemodialysis center 1 week ago because her blood pressure kept dropping during dialysis.  She limits salt in the foods.  DM: She was discharged on Lantus 6 units at bedtime.  Daughter wants to  know whether to restart glipizide XL 10 mg which she was on prior to hospitalization.  Looks like this was discontinued on her hospitalization back in January for diverticulitis.   She gives blood sugars before breakfast and at bedtime.  Some of the recent blood sugars before breakfast were: 146, 149, 114, 120.  Recent blood sugars at bedtime: 319, 499, 270. Lab Results  Component Value Date   HGBA1C 7.4 (H) 11/20/2019    Chronic anemia: Daughter reports that she gets Arinesp about every 3 to 4 weeks.  Home health for PT/OT ordered on discharge from the hospital.  [facility] had reportedly sent [place] a request but it was denied since we had not seen patient since her hospital follow-up.  She feels that she would benefit from some home PT/OT.  She has not had any falls.  She has a cane but daughter states that she has not been using it in the house.  Daughter states that she needs hands-on assist with getting up and going to the bathroom or other places in the house.   Outpatient Encounter Medications as of 12/04/2019  Medication Sig Note  . ACCU-CHEK FASTCLIX LANCETS MISC Uad tid (Patient taking differently: 1 each by Other route 3 (three) times daily. )   . acetaminophen (TYLENOL) 325 MG tablet Take 2 tablets (650 mg total) by mouth every 6 (six) hours as needed. (Patient taking differently: Take 650 mg by mouth every 6 (six) hours as needed for headache (pain). )   . albuterol (PROVENTIL HFA;VENTOLIN HFA) 108 (90 Base) MCG/ACT inhaler Inhale 1-2 puffs into the lungs every 6 (six) hours as needed for wheezing or shortness of breath. (Patient not taking: Reported on 11/19/2019)   . Amino Acids-Protein Hydrolys (FEEDING SUPPLEMENT, PRO-STAT SUGAR FREE 64,) LIQD Take 30 mLs by mouth 2 (two) times daily.   Marland Kitchen aspirin EC 81 MG tablet Take 1 tablet (81 mg total) by mouth daily.   Marland Kitchen atorvastatin (LIPITOR) 20 MG tablet Take 1/2 (one-half) tablet by mouth once daily (Patient taking differently: Take 10 mg  by mouth daily. )   . Blood Glucose Monitoring Suppl (ACCU-CHEK GUIDE) w/Device KIT 1 each by Does not apply route 3 (three) times daily. Use tid as directed   . calcitRIOL (ROCALTROL) 0.25 MCG capsule Take 0.25 mcg by mouth daily.   . carvedilol (COREG) 25 MG tablet TAKE 1 TABLET BY MOUTH TWICE DAILY WITH A MEAL (Patient taking differently: Take 25 mg by mouth 2 (two) times daily with a meal. )   . citalopram (CELEXA) 20 MG tablet Take 1 tablet (20 mg total) by mouth daily.   . furosemide (LASIX) 80 MG tablet Take 1 tablet (80 mg total) by mouth daily.   Marland Kitchen gabapentin (NEURONTIN) 100 MG capsule TAKE 1 CAPSULE (100 MG TOTAL) BY MOUTH AT BEDTIME.   Marland Kitchen gentamicin cream (GARAMYCIN) 0.1 % Apply 1 application topically See admin instructions. Apply topically to dialysis site after showering - every other day   . glucose blood (ACCU-CHEK GUIDE) test strip Use as instructed tid (Patient taking differently: 1 each by Other route 3 (three) times daily. )   . hydrALAZINE (APRESOLINE) 100 MG tablet Take 1 tablet (100  mg total) by mouth 2 (two) times daily.   . hydrOXYzine (ATARAX/VISTARIL) 25 MG tablet Take 1 tablet (25 mg total) by mouth 3 (three) times daily as needed for anxiety.   . Insulin Glargine (LANTUS SOLOSTAR) 100 UNIT/ML Solostar Pen Inject 6 Units into the skin at bedtime.   . Insulin Pen Needle (TRUEPLUS PEN NEEDLES) 32G X 4 MM MISC Use as directed to inject insulin. (Patient taking differently: 1 each by Other route daily. )   . Insulin Pen Needle 31G X 5 MM MISC Use as directed (Patient taking differently: 1 each by Other route daily. )   . isosorbide mononitrate (IMDUR) 30 MG 24 hr tablet Take 1/2 (one-half) tablet by mouth once daily (Patient taking differently: Take 15 mg by mouth daily. )   . lisinopril (ZESTRIL) 40 MG tablet Take 40 mg by mouth daily.   . multivitamin (RENA-VIT) TABS tablet Take 1 tablet by mouth at bedtime.   Marland Kitchen oxyCODONE (OXY IR/ROXICODONE) 5 MG immediate release tablet  Take 1 tablet (5 mg total) by mouth every 4 (four) hours as needed for moderate pain or breakthrough pain (Hold & Call MD if SBP<90, HR<65, RR<10, O2<90, or altered mental status.).   Marland Kitchen Polyethyl Glycol-Propyl Glycol (SYSTANE) 0.4-0.3 % SOLN Place 1 drop into both eyes 3 (three) times daily as needed (dry eyes).    . promethazine (PHENERGAN) 25 MG tablet Take 25 mg by mouth daily as needed for nausea.    . sevelamer (RENAGEL) 800 MG tablet Take 1,600-3,200 mg by mouth See admin instructions. Take 2-4 tablets (1600-3200 mg) by mouth up to three times daily with meals - 2-3 tablets with small meal, 4 tablets with large meal 10/10/2019: Pt reports not eating very much recently and has not had meals to take sevelamer with  . spironolactone (ALDACTONE) 25 MG tablet Take 25 mg by mouth daily.    No facility-administered encounter medications on file as of 12/04/2019.      Observations/Objective: Patient's voice sounds weak.  Assessment and Plan: 1. Hospital discharge follow-up 2. ESRD on hemodialysis [facility]) -Per patient and her daughter, plan for continued hemodialysis is temporary until patient has had abdominal surgery done.  3. Anxiety and depression Continue Celexa.  I recommend decreasing the hydroxyzine to 25 mg once a day as needed.   - citalopram (CELEXA) 20 MG tablet; Take 1 tablet (20 mg total) by mouth daily.  Dispense: 90 tablet; Refill: 1 - Ambulatory referral to Psychiatry - hydrOXYzine (ATARAX/VISTARIL) 25 MG tablet; Take 1 tablet (25 mg total) by mouth daily as needed for anxiety.  Dispense: 30 tablet; Refill: 3  4. Type 2 diabetes mellitus with chronic kidney disease on chronic dialysis, with long-term current use of insulin ([place]) -Patient and daughter inquiring about her being placed back on Glucotrol instead of continuing with Lantus.  However given that she is end-stage renal disease it is probably better for her to be on insulin then long-acting sulfonylurea.  I recommend  continuing Lantus 60 units at bedtime.  I recommend adding NovoLog for mealtime coverage as needed.  Daughter states that the NovoLog in the past would drop her blood sugars too low.  I recommend NovoLog  2 units with meals if blood sugar is greater than 300 prior to meals. - insulin aspart (NOVOLOG FLEXPEN) 100 UNIT/ML FlexPen; Give 2 units with meals if blood sugar > or =300  Dispense: 15 mL; Refill: 11  5. Hypertensive kidney disease with end stage chronic kidney disease on dialysis (  [place]) Not at goal.  Most of her antihypertensive have been discontinued by nephrology.  She is currently only on carvedilol.  I recommend adding low-dose of hydralazine.  She was previously on 100 mg twice a day.  I recommend 25 mg twice a day. - hydrALAZINE (APRESOLINE) 25 MG tablet; Take 1 tablet (25 mg total) by mouth 2 (two) times daily.  Dispense: 60 tablet; Refill: 5  6. Anemia due to chronic kidney disease, on chronic dialysis ([place]) Pt reportedly receiving Arinesp from nephrology when needed  7. Gait abnormality I have placed order for home health for her to receive home physical therapy - Ambulatory referral to [place]   Follow Up Instructions: 6 to 7 weeks in person.   I discussed the assessment and treatment plan with the patient. The patient was provided an opportunity to ask questions and all were answered. The patient agreed with the plan and demonstrated an understanding of the instructions.   The patient was advised to call back or seek an in-person evaluation if the symptoms worsen or if the condition fails to improve as anticipated.  I provided 25 minutes of non-face-to-face time during this encounter.   Karle Plumber, MD

## 2019-12-05 ENCOUNTER — Telehealth: Payer: Self-pay

## 2019-12-05 DIAGNOSIS — Z20828 Contact with and (suspected) exposure to other viral communicable diseases: Secondary | ICD-10-CM | POA: Diagnosis not present

## 2019-12-05 DIAGNOSIS — Z992 Dependence on renal dialysis: Secondary | ICD-10-CM | POA: Diagnosis not present

## 2019-12-05 DIAGNOSIS — Z20822 Contact with and (suspected) exposure to covid-19: Secondary | ICD-10-CM | POA: Diagnosis not present

## 2019-12-05 DIAGNOSIS — N186 End stage renal disease: Secondary | ICD-10-CM | POA: Diagnosis not present

## 2019-12-05 DIAGNOSIS — N2581 Secondary hyperparathyroidism of renal origin: Secondary | ICD-10-CM | POA: Diagnosis not present

## 2019-12-05 NOTE — Telephone Encounter (Signed)
Call placed to [facility], spoke to [place] who confirmed that they have received information about the referral.  Informed her that the patient had a virtual visit yesterday and Dr Wynetta Emery would now be able to sign orders.

## 2019-12-06 ENCOUNTER — Other Ambulatory Visit: Payer: Medicare HMO

## 2019-12-07 DIAGNOSIS — N186 End stage renal disease: Secondary | ICD-10-CM | POA: Diagnosis not present

## 2019-12-07 DIAGNOSIS — Z992 Dependence on renal dialysis: Secondary | ICD-10-CM | POA: Diagnosis not present

## 2019-12-07 DIAGNOSIS — N2581 Secondary hyperparathyroidism of renal origin: Secondary | ICD-10-CM | POA: Diagnosis not present

## 2019-12-08 DIAGNOSIS — Z974 Presence of external hearing-aid: Secondary | ICD-10-CM | POA: Diagnosis not present

## 2019-12-08 DIAGNOSIS — I120 Hypertensive chronic kidney disease with stage 5 chronic kidney disease or end stage renal disease: Secondary | ICD-10-CM | POA: Diagnosis not present

## 2019-12-08 DIAGNOSIS — Z992 Dependence on renal dialysis: Secondary | ICD-10-CM | POA: Diagnosis not present

## 2019-12-08 DIAGNOSIS — K430 Incisional hernia with obstruction, without gangrene: Secondary | ICD-10-CM | POA: Diagnosis not present

## 2019-12-08 DIAGNOSIS — I5042 Chronic combined systolic (congestive) and diastolic (congestive) heart failure: Secondary | ICD-10-CM | POA: Diagnosis not present

## 2019-12-08 DIAGNOSIS — K660 Peritoneal adhesions (postprocedural) (postinfection): Secondary | ICD-10-CM | POA: Diagnosis not present

## 2019-12-08 DIAGNOSIS — K420 Umbilical hernia with obstruction, without gangrene: Secondary | ICD-10-CM | POA: Diagnosis not present

## 2019-12-08 DIAGNOSIS — I10 Essential (primary) hypertension: Secondary | ICD-10-CM | POA: Diagnosis not present

## 2019-12-08 DIAGNOSIS — R2681 Unsteadiness on feet: Secondary | ICD-10-CM | POA: Diagnosis not present

## 2019-12-08 DIAGNOSIS — D72829 Elevated white blood cell count, unspecified: Secondary | ICD-10-CM | POA: Diagnosis not present

## 2019-12-08 DIAGNOSIS — K650 Generalized (acute) peritonitis: Secondary | ICD-10-CM | POA: Diagnosis not present

## 2019-12-08 DIAGNOSIS — K5732 Diverticulitis of large intestine without perforation or abscess without bleeding: Secondary | ICD-10-CM | POA: Diagnosis not present

## 2019-12-08 DIAGNOSIS — E1122 Type 2 diabetes mellitus with diabetic chronic kidney disease: Secondary | ICD-10-CM | POA: Diagnosis not present

## 2019-12-08 DIAGNOSIS — N186 End stage renal disease: Secondary | ICD-10-CM | POA: Diagnosis not present

## 2019-12-08 DIAGNOSIS — T8571XA Infection and inflammatory reaction due to peritoneal dialysis catheter, initial encounter: Secondary | ICD-10-CM | POA: Diagnosis not present

## 2019-12-08 DIAGNOSIS — D649 Anemia, unspecified: Secondary | ICD-10-CM | POA: Diagnosis not present

## 2019-12-08 DIAGNOSIS — R509 Fever, unspecified: Secondary | ICD-10-CM | POA: Diagnosis not present

## 2019-12-08 DIAGNOSIS — K5720 Diverticulitis of large intestine with perforation and abscess without bleeding: Secondary | ICD-10-CM | POA: Diagnosis not present

## 2019-12-08 DIAGNOSIS — N2581 Secondary hyperparathyroidism of renal origin: Secondary | ICD-10-CM | POA: Diagnosis not present

## 2019-12-08 DIAGNOSIS — E1169 Type 2 diabetes mellitus with other specified complication: Secondary | ICD-10-CM | POA: Diagnosis not present

## 2019-12-08 DIAGNOSIS — Z4902 Encounter for fitting and adjustment of peritoneal dialysis catheter: Secondary | ICD-10-CM | POA: Diagnosis not present

## 2019-12-08 DIAGNOSIS — I132 Hypertensive heart and chronic kidney disease with heart failure and with stage 5 chronic kidney disease, or end stage renal disease: Secondary | ICD-10-CM | POA: Diagnosis not present

## 2019-12-08 DIAGNOSIS — Z794 Long term (current) use of insulin: Secondary | ICD-10-CM | POA: Diagnosis not present

## 2019-12-09 DIAGNOSIS — Z992 Dependence on renal dialysis: Secondary | ICD-10-CM | POA: Diagnosis not present

## 2019-12-09 DIAGNOSIS — N186 End stage renal disease: Secondary | ICD-10-CM | POA: Diagnosis not present

## 2019-12-09 DIAGNOSIS — N2581 Secondary hyperparathyroidism of renal origin: Secondary | ICD-10-CM | POA: Diagnosis not present

## 2019-12-10 DIAGNOSIS — Z992 Dependence on renal dialysis: Secondary | ICD-10-CM | POA: Diagnosis not present

## 2019-12-10 DIAGNOSIS — N186 End stage renal disease: Secondary | ICD-10-CM | POA: Diagnosis not present

## 2019-12-10 DIAGNOSIS — E1122 Type 2 diabetes mellitus with diabetic chronic kidney disease: Secondary | ICD-10-CM | POA: Diagnosis not present

## 2019-12-11 DIAGNOSIS — K420 Umbilical hernia with obstruction, without gangrene: Secondary | ICD-10-CM | POA: Diagnosis not present

## 2019-12-11 DIAGNOSIS — N186 End stage renal disease: Secondary | ICD-10-CM | POA: Diagnosis not present

## 2019-12-11 DIAGNOSIS — Z992 Dependence on renal dialysis: Secondary | ICD-10-CM | POA: Diagnosis not present

## 2019-12-11 DIAGNOSIS — E1122 Type 2 diabetes mellitus with diabetic chronic kidney disease: Secondary | ICD-10-CM | POA: Diagnosis not present

## 2019-12-11 DIAGNOSIS — K5732 Diverticulitis of large intestine without perforation or abscess without bleeding: Secondary | ICD-10-CM | POA: Diagnosis not present

## 2019-12-14 MED ORDER — ALBUTEROL SULFATE HFA 108 (90 BASE) MCG/ACT IN AERS
2.00 | INHALATION_SPRAY | RESPIRATORY_TRACT | Status: DC
Start: ? — End: 2019-12-14

## 2019-12-14 MED ORDER — CARVEDILOL 12.5 MG PO TABS
25.00 | ORAL_TABLET | ORAL | Status: DC
Start: 2019-12-18 — End: 2019-12-14

## 2019-12-14 MED ORDER — HEPARIN SODIUM (PORCINE) 5000 UNIT/ML IJ SOLN
5000.00 | INTRAMUSCULAR | Status: DC
Start: 2019-12-18 — End: 2019-12-14

## 2019-12-14 MED ORDER — SODIUM CHLORIDE FLUSH 0.9 % IV SOLN
5.00 | INTRAVENOUS | Status: DC
Start: ? — End: 2019-12-14

## 2019-12-14 MED ORDER — ONDANSETRON HCL 4 MG/2ML IJ SOLN
4.00 | INTRAMUSCULAR | Status: DC
Start: ? — End: 2019-12-14

## 2019-12-14 MED ORDER — SODIUM CHLORIDE FLUSH 0.9 % IV SOLN
5.00 | INTRAVENOUS | Status: DC
Start: 2019-12-18 — End: 2019-12-14

## 2019-12-14 MED ORDER — CITALOPRAM HYDROBROMIDE 20 MG PO TABS
20.00 | ORAL_TABLET | ORAL | Status: DC
Start: 2019-12-19 — End: 2019-12-14

## 2019-12-14 MED ORDER — HYDRALAZINE HCL 25 MG PO TABS
25.00 | ORAL_TABLET | ORAL | Status: DC
Start: 2019-12-14 — End: 2019-12-14

## 2019-12-14 MED ORDER — RENA-VITE PO TABS
1.00 | ORAL_TABLET | ORAL | Status: DC
Start: 2019-12-19 — End: 2019-12-14

## 2019-12-14 MED ORDER — SEVELAMER CARBONATE 800 MG PO TABS
800.00 | ORAL_TABLET | ORAL | Status: DC
Start: 2019-12-19 — End: 2019-12-14

## 2019-12-14 MED ORDER — GABAPENTIN 100 MG PO CAPS
100.00 | ORAL_CAPSULE | ORAL | Status: DC
Start: 2019-12-18 — End: 2019-12-14

## 2019-12-14 MED ORDER — GENERIC EXTERNAL MEDICATION
500.00 | Status: DC
Start: 2019-12-14 — End: 2019-12-14

## 2019-12-14 MED ORDER — INSULIN LISPRO 100 UNIT/ML ~~LOC~~ SOLN
1.00 | SUBCUTANEOUS | Status: DC
Start: 2019-12-19 — End: 2019-12-14

## 2019-12-14 MED ORDER — HEPARIN SODIUM (PORCINE) 10000 UNIT/ML IJ SOLN
0.50 | INTRAMUSCULAR | Status: DC
Start: ? — End: 2019-12-14

## 2019-12-14 MED ORDER — GLUCAGON (RDNA) 1 MG IJ KIT
1.00 | PACK | INTRAMUSCULAR | Status: DC
Start: ? — End: 2019-12-14

## 2019-12-14 MED ORDER — POTASSIUM CHLORIDE CRYS ER 20 MEQ PO TBCR
20.00 | EXTENDED_RELEASE_TABLET | ORAL | Status: DC
Start: 2019-12-19 — End: 2019-12-14

## 2019-12-14 MED ORDER — DEXTROSE 10 % IV SOLN
125.00 | INTRAVENOUS | Status: DC
Start: ? — End: 2019-12-14

## 2019-12-14 MED ORDER — HYDROCODONE-ACETAMINOPHEN 5-325 MG PO TABS
2.00 | ORAL_TABLET | ORAL | Status: DC
Start: ? — End: 2019-12-14

## 2019-12-14 MED ORDER — SODIUM CHLORIDE 0.9 % IV SOLN
INTRAVENOUS | Status: DC
Start: ? — End: 2019-12-14

## 2019-12-14 MED ORDER — HYDROCODONE-ACETAMINOPHEN 5-325 MG PO TABS
1.00 | ORAL_TABLET | ORAL | Status: DC
Start: ? — End: 2019-12-14

## 2019-12-14 MED ORDER — HYDROXYZINE HCL 25 MG PO TABS
25.00 | ORAL_TABLET | ORAL | Status: DC
Start: 2019-12-18 — End: 2019-12-14

## 2019-12-14 MED ORDER — HYDRALAZINE HCL 20 MG/ML IJ SOLN
10.00 | INTRAMUSCULAR | Status: DC
Start: ? — End: 2019-12-14

## 2019-12-14 MED ORDER — GLUCOSE 40 % PO GEL
15.00 | ORAL | Status: DC
Start: ? — End: 2019-12-14

## 2019-12-19 ENCOUNTER — Telehealth: Payer: Self-pay | Admitting: Internal Medicine

## 2019-12-19 ENCOUNTER — Telehealth: Payer: Self-pay

## 2019-12-19 DIAGNOSIS — N2581 Secondary hyperparathyroidism of renal origin: Secondary | ICD-10-CM | POA: Diagnosis not present

## 2019-12-19 DIAGNOSIS — N186 End stage renal disease: Secondary | ICD-10-CM | POA: Diagnosis not present

## 2019-12-19 DIAGNOSIS — Z992 Dependence on renal dialysis: Secondary | ICD-10-CM | POA: Diagnosis not present

## 2019-12-19 MED ORDER — DIPHENHYDRAMINE HCL 50 MG/ML IJ SOLN
25.00 | INTRAMUSCULAR | Status: DC
Start: ? — End: 2019-12-19

## 2019-12-19 MED ORDER — POLYETHYLENE GLYCOL 3350 17 GM/SCOOP PO POWD
17.00 | ORAL | Status: DC
Start: 2019-12-19 — End: 2019-12-19

## 2019-12-19 MED ORDER — GENERIC EXTERNAL MEDICATION
500.00 | Status: DC
Start: 2019-12-19 — End: 2019-12-19

## 2019-12-19 MED ORDER — PSYLLIUM 58.6 % PO PACK
1.00 | PACK | ORAL | Status: DC
Start: 2019-12-19 — End: 2019-12-19

## 2019-12-19 MED ORDER — HYDRALAZINE HCL 50 MG PO TABS
50.00 | ORAL_TABLET | ORAL | Status: DC
Start: 2019-12-18 — End: 2019-12-19

## 2019-12-19 MED ORDER — GENERIC EXTERNAL MEDICATION
100.00 | Status: DC
Start: 2019-12-19 — End: 2019-12-19

## 2019-12-19 MED ORDER — TERAZOSIN HCL 1 MG PO CAPS
1.00 | ORAL_CAPSULE | ORAL | Status: DC
Start: 2019-12-18 — End: 2019-12-19

## 2019-12-19 NOTE — Telephone Encounter (Signed)
Call placed to [facility], spoke to [place] who confirmed that they have the referral and are just trying to schedule visit with the patient

## 2019-12-19 NOTE — Telephone Encounter (Signed)
Patricia [name] called and requested to inform pcp that the earliest they could see her is Friday 12-22-2019 due to a clash in the scheduling with the patients dialysis. Patricia [name] wanted to make sure that was okay with pcp. Please follow up at your earliest convenience.

## 2019-12-20 NOTE — Telephone Encounter (Signed)
Returned Patricia [name] call and made her aware of Dr. Wynetta Emery response.

## 2019-12-20 NOTE — Telephone Encounter (Signed)
Will forward to pcp

## 2019-12-21 DIAGNOSIS — N2581 Secondary hyperparathyroidism of renal origin: Secondary | ICD-10-CM | POA: Diagnosis not present

## 2019-12-21 DIAGNOSIS — Z992 Dependence on renal dialysis: Secondary | ICD-10-CM | POA: Diagnosis not present

## 2019-12-21 DIAGNOSIS — N186 End stage renal disease: Secondary | ICD-10-CM | POA: Diagnosis not present

## 2019-12-22 DIAGNOSIS — N186 End stage renal disease: Secondary | ICD-10-CM | POA: Diagnosis not present

## 2019-12-22 DIAGNOSIS — I2510 Atherosclerotic heart disease of native coronary artery without angina pectoris: Secondary | ICD-10-CM | POA: Diagnosis not present

## 2019-12-22 DIAGNOSIS — Z4801 Encounter for change or removal of surgical wound dressing: Secondary | ICD-10-CM | POA: Diagnosis not present

## 2019-12-22 DIAGNOSIS — E1122 Type 2 diabetes mellitus with diabetic chronic kidney disease: Secondary | ICD-10-CM | POA: Diagnosis not present

## 2019-12-22 DIAGNOSIS — K5720 Diverticulitis of large intestine with perforation and abscess without bleeding: Secondary | ICD-10-CM | POA: Diagnosis not present

## 2019-12-22 DIAGNOSIS — Z48815 Encounter for surgical aftercare following surgery on the digestive system: Secondary | ICD-10-CM | POA: Diagnosis not present

## 2019-12-22 DIAGNOSIS — I429 Cardiomyopathy, unspecified: Secondary | ICD-10-CM | POA: Diagnosis not present

## 2019-12-22 DIAGNOSIS — I110 Hypertensive heart disease with heart failure: Secondary | ICD-10-CM | POA: Diagnosis not present

## 2019-12-22 DIAGNOSIS — I5043 Acute on chronic combined systolic (congestive) and diastolic (congestive) heart failure: Secondary | ICD-10-CM | POA: Diagnosis not present

## 2019-12-22 NOTE — Telephone Encounter (Signed)
Patricia [name] from [facility] called and requested for verbal orders for a wound follow up with education and management. She requested for 1x a week for 1 week, 2xs a week for 2 weeks, 6xs a week for 1 week, and 2 prns. Please follow up at your earliest convenience with [name] at [phone].

## 2019-12-23 DIAGNOSIS — N2581 Secondary hyperparathyroidism of renal origin: Secondary | ICD-10-CM | POA: Diagnosis not present

## 2019-12-23 DIAGNOSIS — N186 End stage renal disease: Secondary | ICD-10-CM | POA: Diagnosis not present

## 2019-12-23 DIAGNOSIS — Z992 Dependence on renal dialysis: Secondary | ICD-10-CM | POA: Diagnosis not present

## 2019-12-25 DIAGNOSIS — Z8719 Personal history of other diseases of the digestive system: Secondary | ICD-10-CM | POA: Diagnosis not present

## 2019-12-25 DIAGNOSIS — Z9889 Other specified postprocedural states: Secondary | ICD-10-CM | POA: Diagnosis not present

## 2019-12-25 DIAGNOSIS — T8571XD Infection and inflammatory reaction due to peritoneal dialysis catheter, subsequent encounter: Secondary | ICD-10-CM | POA: Diagnosis not present

## 2019-12-25 DIAGNOSIS — B379 Candidiasis, unspecified: Secondary | ICD-10-CM | POA: Diagnosis not present

## 2019-12-25 NOTE — Telephone Encounter (Signed)
Returned call and spoke to [place] and provided verbal orders

## 2019-12-26 DIAGNOSIS — N2581 Secondary hyperparathyroidism of renal origin: Secondary | ICD-10-CM | POA: Diagnosis not present

## 2019-12-26 DIAGNOSIS — Z992 Dependence on renal dialysis: Secondary | ICD-10-CM | POA: Diagnosis not present

## 2019-12-26 DIAGNOSIS — N186 End stage renal disease: Secondary | ICD-10-CM | POA: Diagnosis not present

## 2019-12-27 DIAGNOSIS — Z48815 Encounter for surgical aftercare following surgery on the digestive system: Secondary | ICD-10-CM | POA: Diagnosis not present

## 2019-12-27 DIAGNOSIS — I2510 Atherosclerotic heart disease of native coronary artery without angina pectoris: Secondary | ICD-10-CM | POA: Diagnosis not present

## 2019-12-27 DIAGNOSIS — I110 Hypertensive heart disease with heart failure: Secondary | ICD-10-CM | POA: Diagnosis not present

## 2019-12-27 DIAGNOSIS — K5720 Diverticulitis of large intestine with perforation and abscess without bleeding: Secondary | ICD-10-CM | POA: Diagnosis not present

## 2019-12-27 DIAGNOSIS — Z4801 Encounter for change or removal of surgical wound dressing: Secondary | ICD-10-CM | POA: Diagnosis not present

## 2019-12-27 DIAGNOSIS — I429 Cardiomyopathy, unspecified: Secondary | ICD-10-CM | POA: Diagnosis not present

## 2019-12-27 DIAGNOSIS — N186 End stage renal disease: Secondary | ICD-10-CM | POA: Diagnosis not present

## 2019-12-27 DIAGNOSIS — I5043 Acute on chronic combined systolic (congestive) and diastolic (congestive) heart failure: Secondary | ICD-10-CM | POA: Diagnosis not present

## 2019-12-27 DIAGNOSIS — E1122 Type 2 diabetes mellitus with diabetic chronic kidney disease: Secondary | ICD-10-CM | POA: Diagnosis not present

## 2019-12-28 DIAGNOSIS — Z992 Dependence on renal dialysis: Secondary | ICD-10-CM | POA: Diagnosis not present

## 2019-12-28 DIAGNOSIS — N186 End stage renal disease: Secondary | ICD-10-CM | POA: Diagnosis not present

## 2019-12-28 DIAGNOSIS — N2581 Secondary hyperparathyroidism of renal origin: Secondary | ICD-10-CM | POA: Diagnosis not present

## 2019-12-29 DIAGNOSIS — I429 Cardiomyopathy, unspecified: Secondary | ICD-10-CM | POA: Diagnosis not present

## 2019-12-29 DIAGNOSIS — K5720 Diverticulitis of large intestine with perforation and abscess without bleeding: Secondary | ICD-10-CM | POA: Diagnosis not present

## 2019-12-29 DIAGNOSIS — Z4801 Encounter for change or removal of surgical wound dressing: Secondary | ICD-10-CM | POA: Diagnosis not present

## 2019-12-29 DIAGNOSIS — I110 Hypertensive heart disease with heart failure: Secondary | ICD-10-CM | POA: Diagnosis not present

## 2019-12-29 DIAGNOSIS — I5043 Acute on chronic combined systolic (congestive) and diastolic (congestive) heart failure: Secondary | ICD-10-CM | POA: Diagnosis not present

## 2019-12-29 DIAGNOSIS — I2510 Atherosclerotic heart disease of native coronary artery without angina pectoris: Secondary | ICD-10-CM | POA: Diagnosis not present

## 2019-12-29 DIAGNOSIS — E1122 Type 2 diabetes mellitus with diabetic chronic kidney disease: Secondary | ICD-10-CM | POA: Diagnosis not present

## 2019-12-29 DIAGNOSIS — N186 End stage renal disease: Secondary | ICD-10-CM | POA: Diagnosis not present

## 2019-12-29 DIAGNOSIS — Z48815 Encounter for surgical aftercare following surgery on the digestive system: Secondary | ICD-10-CM | POA: Diagnosis not present

## 2019-12-30 ENCOUNTER — Emergency Department (HOSPITAL_COMMUNITY): Payer: Medicare HMO

## 2019-12-30 ENCOUNTER — Other Ambulatory Visit: Payer: Self-pay

## 2019-12-30 ENCOUNTER — Inpatient Hospital Stay (HOSPITAL_COMMUNITY)
Admission: EM | Admit: 2019-12-30 | Discharge: 2020-02-10 | DRG: 308 | Disposition: E | Payer: Medicare HMO | Attending: Family Medicine | Admitting: Family Medicine

## 2019-12-30 DIAGNOSIS — I462 Cardiac arrest due to underlying cardiac condition: Secondary | ICD-10-CM | POA: Diagnosis present

## 2019-12-30 DIAGNOSIS — E113319 Type 2 diabetes mellitus with moderate nonproliferative diabetic retinopathy with macular edema, unspecified eye: Secondary | ICD-10-CM | POA: Diagnosis present

## 2019-12-30 DIAGNOSIS — Z515 Encounter for palliative care: Secondary | ICD-10-CM | POA: Diagnosis not present

## 2019-12-30 DIAGNOSIS — G92 Toxic encephalopathy: Secondary | ICD-10-CM | POA: Diagnosis not present

## 2019-12-30 DIAGNOSIS — E1122 Type 2 diabetes mellitus with diabetic chronic kidney disease: Secondary | ICD-10-CM | POA: Diagnosis present

## 2019-12-30 DIAGNOSIS — N2581 Secondary hyperparathyroidism of renal origin: Secondary | ICD-10-CM | POA: Diagnosis not present

## 2019-12-30 DIAGNOSIS — L8990 Pressure ulcer of unspecified site, unspecified stage: Secondary | ICD-10-CM | POA: Insufficient documentation

## 2019-12-30 DIAGNOSIS — I10 Essential (primary) hypertension: Secondary | ICD-10-CM | POA: Diagnosis not present

## 2019-12-30 DIAGNOSIS — N186 End stage renal disease: Secondary | ICD-10-CM

## 2019-12-30 DIAGNOSIS — I361 Nonrheumatic tricuspid (valve) insufficiency: Secondary | ICD-10-CM | POA: Diagnosis not present

## 2019-12-30 DIAGNOSIS — R404 Transient alteration of awareness: Secondary | ICD-10-CM | POA: Diagnosis not present

## 2019-12-30 DIAGNOSIS — G931 Anoxic brain damage, not elsewhere classified: Secondary | ICD-10-CM

## 2019-12-30 DIAGNOSIS — J9601 Acute respiratory failure with hypoxia: Secondary | ICD-10-CM | POA: Diagnosis present

## 2019-12-30 DIAGNOSIS — I469 Cardiac arrest, cause unspecified: Secondary | ICD-10-CM | POA: Diagnosis not present

## 2019-12-30 DIAGNOSIS — I5042 Chronic combined systolic (congestive) and diastolic (congestive) heart failure: Secondary | ICD-10-CM | POA: Diagnosis present

## 2019-12-30 DIAGNOSIS — R000 Tachycardia, unspecified: Secondary | ICD-10-CM | POA: Diagnosis not present

## 2019-12-30 DIAGNOSIS — K7200 Acute and subacute hepatic failure without coma: Secondary | ICD-10-CM | POA: Diagnosis not present

## 2019-12-30 DIAGNOSIS — Z789 Other specified health status: Secondary | ICD-10-CM | POA: Diagnosis not present

## 2019-12-30 DIAGNOSIS — E1165 Type 2 diabetes mellitus with hyperglycemia: Secondary | ICD-10-CM | POA: Diagnosis not present

## 2019-12-30 DIAGNOSIS — R0689 Other abnormalities of breathing: Secondary | ICD-10-CM | POA: Diagnosis not present

## 2019-12-30 DIAGNOSIS — Z885 Allergy status to narcotic agent status: Secondary | ICD-10-CM

## 2019-12-30 DIAGNOSIS — E785 Hyperlipidemia, unspecified: Secondary | ICD-10-CM | POA: Diagnosis present

## 2019-12-30 DIAGNOSIS — J690 Pneumonitis due to inhalation of food and vomit: Secondary | ICD-10-CM | POA: Diagnosis not present

## 2019-12-30 DIAGNOSIS — I132 Hypertensive heart and chronic kidney disease with heart failure and with stage 5 chronic kidney disease, or end stage renal disease: Secondary | ICD-10-CM | POA: Diagnosis present

## 2019-12-30 DIAGNOSIS — Z9911 Dependence on respirator [ventilator] status: Secondary | ICD-10-CM | POA: Diagnosis not present

## 2019-12-30 DIAGNOSIS — E873 Alkalosis: Secondary | ICD-10-CM | POA: Diagnosis present

## 2019-12-30 DIAGNOSIS — R0602 Shortness of breath: Secondary | ICD-10-CM

## 2019-12-30 DIAGNOSIS — J90 Pleural effusion, not elsewhere classified: Secondary | ICD-10-CM | POA: Diagnosis not present

## 2019-12-30 DIAGNOSIS — E669 Obesity, unspecified: Secondary | ICD-10-CM | POA: Diagnosis present

## 2019-12-30 DIAGNOSIS — Z800 Family history of malignant neoplasm of digestive organs: Secondary | ICD-10-CM

## 2019-12-30 DIAGNOSIS — R0989 Other specified symptoms and signs involving the circulatory and respiratory systems: Secondary | ICD-10-CM | POA: Diagnosis not present

## 2019-12-30 DIAGNOSIS — Z4682 Encounter for fitting and adjustment of non-vascular catheter: Secondary | ICD-10-CM | POA: Diagnosis not present

## 2019-12-30 DIAGNOSIS — I509 Heart failure, unspecified: Secondary | ICD-10-CM | POA: Diagnosis not present

## 2019-12-30 DIAGNOSIS — Z6828 Body mass index (BMI) 28.0-28.9, adult: Secondary | ICD-10-CM

## 2019-12-30 DIAGNOSIS — E43 Unspecified severe protein-calorie malnutrition: Secondary | ICD-10-CM | POA: Diagnosis not present

## 2019-12-30 DIAGNOSIS — J189 Pneumonia, unspecified organism: Secondary | ICD-10-CM | POA: Diagnosis not present

## 2019-12-30 DIAGNOSIS — I499 Cardiac arrhythmia, unspecified: Secondary | ICD-10-CM | POA: Diagnosis not present

## 2019-12-30 DIAGNOSIS — Z794 Long term (current) use of insulin: Secondary | ICD-10-CM

## 2019-12-30 DIAGNOSIS — A419 Sepsis, unspecified organism: Secondary | ICD-10-CM | POA: Diagnosis not present

## 2019-12-30 DIAGNOSIS — Z882 Allergy status to sulfonamides status: Secondary | ICD-10-CM

## 2019-12-30 DIAGNOSIS — D696 Thrombocytopenia, unspecified: Secondary | ICD-10-CM | POA: Diagnosis not present

## 2019-12-30 DIAGNOSIS — I6340 Cerebral infarction due to embolism of unspecified cerebral artery: Secondary | ICD-10-CM | POA: Diagnosis not present

## 2019-12-30 DIAGNOSIS — I5020 Unspecified systolic (congestive) heart failure: Secondary | ICD-10-CM | POA: Diagnosis not present

## 2019-12-30 DIAGNOSIS — I429 Cardiomyopathy, unspecified: Secondary | ICD-10-CM | POA: Diagnosis present

## 2019-12-30 DIAGNOSIS — I4901 Ventricular fibrillation: Principal | ICD-10-CM | POA: Diagnosis present

## 2019-12-30 DIAGNOSIS — D62 Acute posthemorrhagic anemia: Secondary | ICD-10-CM | POA: Diagnosis not present

## 2019-12-30 DIAGNOSIS — Z978 Presence of other specified devices: Secondary | ICD-10-CM

## 2019-12-30 DIAGNOSIS — J156 Pneumonia due to other aerobic Gram-negative bacteria: Secondary | ICD-10-CM | POA: Diagnosis not present

## 2019-12-30 DIAGNOSIS — R918 Other nonspecific abnormal finding of lung field: Secondary | ICD-10-CM | POA: Diagnosis not present

## 2019-12-30 DIAGNOSIS — R0603 Acute respiratory distress: Secondary | ICD-10-CM | POA: Diagnosis not present

## 2019-12-30 DIAGNOSIS — Z888 Allergy status to other drugs, medicaments and biological substances status: Secondary | ICD-10-CM

## 2019-12-30 DIAGNOSIS — Z833 Family history of diabetes mellitus: Secondary | ICD-10-CM

## 2019-12-30 DIAGNOSIS — Z9889 Other specified postprocedural states: Secondary | ICD-10-CM | POA: Diagnosis not present

## 2019-12-30 DIAGNOSIS — U071 COVID-19: Secondary | ICD-10-CM | POA: Diagnosis present

## 2019-12-30 DIAGNOSIS — R739 Hyperglycemia, unspecified: Secondary | ICD-10-CM | POA: Diagnosis not present

## 2019-12-30 DIAGNOSIS — R1310 Dysphagia, unspecified: Secondary | ICD-10-CM | POA: Diagnosis not present

## 2019-12-30 DIAGNOSIS — Z452 Encounter for adjustment and management of vascular access device: Secondary | ICD-10-CM

## 2019-12-30 DIAGNOSIS — R4182 Altered mental status, unspecified: Secondary | ICD-10-CM

## 2019-12-30 DIAGNOSIS — R0682 Tachypnea, not elsewhere classified: Secondary | ICD-10-CM

## 2019-12-30 DIAGNOSIS — E1129 Type 2 diabetes mellitus with other diabetic kidney complication: Secondary | ICD-10-CM | POA: Diagnosis not present

## 2019-12-30 DIAGNOSIS — J9600 Acute respiratory failure, unspecified whether with hypoxia or hypercapnia: Secondary | ICD-10-CM | POA: Diagnosis not present

## 2019-12-30 DIAGNOSIS — D631 Anemia in chronic kidney disease: Secondary | ICD-10-CM | POA: Diagnosis present

## 2019-12-30 DIAGNOSIS — F419 Anxiety disorder, unspecified: Secondary | ICD-10-CM | POA: Diagnosis present

## 2019-12-30 DIAGNOSIS — Z66 Do not resuscitate: Secondary | ICD-10-CM | POA: Diagnosis not present

## 2019-12-30 DIAGNOSIS — S01511A Laceration without foreign body of lip, initial encounter: Secondary | ICD-10-CM | POA: Diagnosis not present

## 2019-12-30 DIAGNOSIS — E876 Hypokalemia: Secondary | ICD-10-CM | POA: Diagnosis present

## 2019-12-30 DIAGNOSIS — I340 Nonrheumatic mitral (valve) insufficiency: Secondary | ICD-10-CM | POA: Diagnosis not present

## 2019-12-30 DIAGNOSIS — R6521 Severe sepsis with septic shock: Secondary | ICD-10-CM | POA: Diagnosis not present

## 2019-12-30 DIAGNOSIS — Z8249 Family history of ischemic heart disease and other diseases of the circulatory system: Secondary | ICD-10-CM

## 2019-12-30 DIAGNOSIS — Z992 Dependence on renal dialysis: Secondary | ICD-10-CM | POA: Diagnosis not present

## 2019-12-30 DIAGNOSIS — R9431 Abnormal electrocardiogram [ECG] [EKG]: Secondary | ICD-10-CM | POA: Diagnosis not present

## 2019-12-30 DIAGNOSIS — E8770 Fluid overload, unspecified: Secondary | ICD-10-CM | POA: Diagnosis not present

## 2019-12-30 DIAGNOSIS — E872 Acidosis: Secondary | ICD-10-CM | POA: Diagnosis present

## 2019-12-30 DIAGNOSIS — L89816 Pressure-induced deep tissue damage of head: Secondary | ICD-10-CM | POA: Diagnosis not present

## 2019-12-30 DIAGNOSIS — E11649 Type 2 diabetes mellitus with hypoglycemia without coma: Secondary | ICD-10-CM | POA: Diagnosis not present

## 2019-12-30 DIAGNOSIS — M898X9 Other specified disorders of bone, unspecified site: Secondary | ICD-10-CM | POA: Diagnosis present

## 2019-12-30 DIAGNOSIS — L89152 Pressure ulcer of sacral region, stage 2: Secondary | ICD-10-CM | POA: Diagnosis not present

## 2019-12-30 LAB — CBC WITH DIFFERENTIAL/PLATELET
Abs Immature Granulocytes: 0.49 10*3/uL — ABNORMAL HIGH (ref 0.00–0.07)
Basophils Absolute: 0 10*3/uL (ref 0.0–0.1)
Basophils Relative: 0 %
Eosinophils Absolute: 0 10*3/uL (ref 0.0–0.5)
Eosinophils Relative: 0 %
HCT: 23.6 % — ABNORMAL LOW (ref 36.0–46.0)
Hemoglobin: 7.1 g/dL — ABNORMAL LOW (ref 12.0–15.0)
Immature Granulocytes: 4 %
Lymphocytes Relative: 23 %
Lymphs Abs: 2.9 10*3/uL (ref 0.7–4.0)
MCH: 29.3 pg (ref 26.0–34.0)
MCHC: 30.1 g/dL (ref 30.0–36.0)
MCV: 97.5 fL (ref 80.0–100.0)
Monocytes Absolute: 0.3 10*3/uL (ref 0.1–1.0)
Monocytes Relative: 3 %
Neutro Abs: 8.5 10*3/uL — ABNORMAL HIGH (ref 1.7–7.7)
Neutrophils Relative %: 70 %
Platelets: 213 10*3/uL (ref 150–400)
RBC: 2.42 MIL/uL — ABNORMAL LOW (ref 3.87–5.11)
RDW: 14.5 % (ref 11.5–15.5)
WBC: 12.3 10*3/uL — ABNORMAL HIGH (ref 4.0–10.5)
nRBC: 0 % (ref 0.0–0.2)

## 2019-12-30 LAB — POCT I-STAT EG7
Acid-Base Excess: 2 mmol/L (ref 0.0–2.0)
Bicarbonate: 24.3 mmol/L (ref 20.0–28.0)
Calcium, Ion: 0.85 mmol/L — CL (ref 1.15–1.40)
HCT: 21 % — ABNORMAL LOW (ref 36.0–46.0)
Hemoglobin: 7.1 g/dL — ABNORMAL LOW (ref 12.0–15.0)
O2 Saturation: 94 %
Potassium: 2.9 mmol/L — ABNORMAL LOW (ref 3.5–5.1)
Sodium: 135 mmol/L (ref 135–145)
TCO2: 25 mmol/L (ref 22–32)
pCO2, Ven: 26.5 mmHg — ABNORMAL LOW (ref 44.0–60.0)
pH, Ven: 7.57 — ABNORMAL HIGH (ref 7.250–7.430)
pO2, Ven: 59 mmHg — ABNORMAL HIGH (ref 32.0–45.0)

## 2019-12-30 LAB — URINALYSIS, ROUTINE W REFLEX MICROSCOPIC
Bilirubin Urine: NEGATIVE
Glucose, UA: 150 mg/dL — AB
Hgb urine dipstick: NEGATIVE
Ketones, ur: NEGATIVE mg/dL
Leukocytes,Ua: NEGATIVE
Nitrite: NEGATIVE
Protein, ur: >=300 mg/dL — AB
Specific Gravity, Urine: 1.019 (ref 1.005–1.030)
pH: 7 (ref 5.0–8.0)

## 2019-12-30 LAB — POCT I-STAT 7, (LYTES, BLD GAS, ICA,H+H)
Acid-Base Excess: 7 mmol/L — ABNORMAL HIGH (ref 0.0–2.0)
Bicarbonate: 29.1 mmol/L — ABNORMAL HIGH (ref 20.0–28.0)
Calcium, Ion: 0.95 mmol/L — ABNORMAL LOW (ref 1.15–1.40)
HCT: 17 % — ABNORMAL LOW (ref 36.0–46.0)
Hemoglobin: 5.8 g/dL — CL (ref 12.0–15.0)
O2 Saturation: 100 %
Patient temperature: 100.5
Potassium: 2.3 mmol/L — CL (ref 3.5–5.1)
Sodium: 136 mmol/L (ref 135–145)
TCO2: 30 mmol/L (ref 22–32)
pCO2 arterial: 29.7 mmHg — ABNORMAL LOW (ref 32.0–48.0)
pH, Arterial: 7.601 (ref 7.350–7.450)
pO2, Arterial: 353 mmHg — ABNORMAL HIGH (ref 83.0–108.0)

## 2019-12-30 LAB — I-STAT CHEM 8, ED
BUN: 13 mg/dL (ref 8–23)
Calcium, Ion: 0.86 mmol/L — CL (ref 1.15–1.40)
Chloride: 95 mmol/L — ABNORMAL LOW (ref 98–111)
Creatinine, Ser: 2.4 mg/dL — ABNORMAL HIGH (ref 0.44–1.00)
Glucose, Bld: 198 mg/dL — ABNORMAL HIGH (ref 70–99)
HCT: 22 % — ABNORMAL LOW (ref 36.0–46.0)
Hemoglobin: 7.5 g/dL — ABNORMAL LOW (ref 12.0–15.0)
Potassium: 2.8 mmol/L — ABNORMAL LOW (ref 3.5–5.1)
Sodium: 134 mmol/L — ABNORMAL LOW (ref 135–145)
TCO2: 25 mmol/L (ref 22–32)

## 2019-12-30 LAB — COMPREHENSIVE METABOLIC PANEL
ALT: 45 U/L — ABNORMAL HIGH (ref 0–44)
AST: 105 U/L — ABNORMAL HIGH (ref 15–41)
Albumin: 1.8 g/dL — ABNORMAL LOW (ref 3.5–5.0)
Alkaline Phosphatase: 70 U/L (ref 38–126)
Anion gap: 17 — ABNORMAL HIGH (ref 5–15)
BUN: 14 mg/dL (ref 8–23)
CO2: 22 mmol/L (ref 22–32)
Calcium: 7 mg/dL — ABNORMAL LOW (ref 8.9–10.3)
Chloride: 97 mmol/L — ABNORMAL LOW (ref 98–111)
Creatinine, Ser: 2.65 mg/dL — ABNORMAL HIGH (ref 0.44–1.00)
GFR calc Af Amer: 22 mL/min — ABNORMAL LOW (ref >60)
GFR calc non Af Amer: 19 mL/min — ABNORMAL LOW (ref >60)
Glucose, Bld: 205 mg/dL — ABNORMAL HIGH (ref 70–99)
Potassium: 3 mmol/L — ABNORMAL LOW (ref 3.5–5.1)
Sodium: 136 mmol/L (ref 135–145)
Total Bilirubin: 0.5 mg/dL (ref 0.3–1.2)
Total Protein: 5.4 g/dL — ABNORMAL LOW (ref 6.5–8.1)

## 2019-12-30 LAB — BASIC METABOLIC PANEL
Anion gap: 12 (ref 5–15)
BUN: 15 mg/dL (ref 8–23)
CO2: 24 mmol/L (ref 22–32)
Calcium: 6.5 mg/dL — ABNORMAL LOW (ref 8.9–10.3)
Chloride: 97 mmol/L — ABNORMAL LOW (ref 98–111)
Creatinine, Ser: 2.49 mg/dL — ABNORMAL HIGH (ref 0.44–1.00)
GFR calc Af Amer: 23 mL/min — ABNORMAL LOW (ref >60)
GFR calc non Af Amer: 20 mL/min — ABNORMAL LOW (ref >60)
Glucose, Bld: 240 mg/dL — ABNORMAL HIGH (ref 70–99)
Potassium: 2.8 mmol/L — ABNORMAL LOW (ref 3.5–5.1)
Sodium: 133 mmol/L — ABNORMAL LOW (ref 135–145)

## 2019-12-30 LAB — CBG MONITORING, ED
Glucose-Capillary: 196 mg/dL — ABNORMAL HIGH (ref 70–99)
Glucose-Capillary: 222 mg/dL — ABNORMAL HIGH (ref 70–99)

## 2019-12-30 LAB — RESPIRATORY PANEL BY RT PCR (FLU A&B, COVID)
Influenza A by PCR: NEGATIVE
Influenza B by PCR: NEGATIVE
SARS Coronavirus 2 by RT PCR: POSITIVE — AB

## 2019-12-30 LAB — RAPID URINE DRUG SCREEN, HOSP PERFORMED
Amphetamines: NOT DETECTED
Barbiturates: NOT DETECTED
Benzodiazepines: NOT DETECTED
Cocaine: NOT DETECTED
Opiates: NOT DETECTED
Tetrahydrocannabinol: NOT DETECTED

## 2019-12-30 LAB — LACTIC ACID, PLASMA
Lactic Acid, Venous: 6.2 mmol/L (ref 0.5–1.9)
Lactic Acid, Venous: 1.6 mmol/L (ref 0.5–1.9)

## 2019-12-30 LAB — ETHANOL: Alcohol, Ethyl (B): <10 mg/dL (ref <10)

## 2019-12-30 LAB — MAGNESIUM: Magnesium: 1.7 mg/dL (ref 1.7–2.4)

## 2019-12-30 LAB — PROTIME-INR
INR: 1.1 (ref 0.8–1.2)
Prothrombin Time: 14 seconds (ref 11.4–15.2)

## 2019-12-30 LAB — TROPONIN I (HIGH SENSITIVITY)
Troponin I (High Sensitivity): 38 ng/L — ABNORMAL HIGH (ref <18)
Troponin I (High Sensitivity): 79 ng/L — ABNORMAL HIGH (ref <18)

## 2019-12-30 MED ORDER — DEXAMETHASONE SODIUM PHOSPHATE 10 MG/ML IJ SOLN
6.0000 mg | INTRAMUSCULAR | Status: DC
  Administered 2019-12-31 – 2020-01-06 (×7): 6 mg via INTRAVENOUS
  Filled 2019-12-30 (×5): qty 1
  Filled 2019-12-30: qty 0.6
  Filled 2019-12-30: qty 1

## 2019-12-30 MED ORDER — CISATRACURIUM BOLUS VIA INFUSION
0.1000 mg/kg | Freq: Once | INTRAVENOUS | Status: AC
  Administered 2019-12-30: 6.3 mg via INTRAVENOUS
  Filled 2019-12-30: qty 7

## 2019-12-30 MED ORDER — ETOMIDATE 2 MG/ML IV SOLN
INTRAVENOUS | Status: AC | PRN
  Administered 2019-12-30: 20 mg via INTRAVENOUS

## 2019-12-30 MED ORDER — SODIUM CHLORIDE 0.9 % IV BOLUS
500.0000 mL | Freq: Once | INTRAVENOUS | Status: AC
  Administered 2019-12-30: 23:00:00 500 mL via INTRAVENOUS

## 2019-12-30 MED ORDER — METRONIDAZOLE IN NACL 5-0.79 MG/ML-% IV SOLN
500.0000 mg | Freq: Once | INTRAVENOUS | Status: AC
  Administered 2019-12-30: 500 mg via INTRAVENOUS
  Filled 2019-12-30: qty 100

## 2019-12-30 MED ORDER — FENTANYL CITRATE (PF) 100 MCG/2ML IJ SOLN
50.0000 ug | Freq: Once | INTRAMUSCULAR | Status: AC
  Administered 2019-12-30: 50 ug via INTRAVENOUS
  Filled 2019-12-30: qty 2

## 2019-12-30 MED ORDER — DEXAMETHASONE SODIUM PHOSPHATE 10 MG/ML IJ SOLN
6.0000 mg | Freq: Once | INTRAMUSCULAR | Status: AC
  Administered 2019-12-30: 6 mg via INTRAVENOUS
  Filled 2019-12-30: qty 1

## 2019-12-30 MED ORDER — SODIUM CHLORIDE 0.9 % IV SOLN
1.0000 ug/kg/min | INTRAVENOUS | Status: DC
  Administered 2019-12-30: 1 ug/kg/min via INTRAVENOUS
  Filled 2019-12-30: qty 20

## 2019-12-30 MED ORDER — SODIUM CHLORIDE 0.9 % IV SOLN
100.0000 mg | Freq: Every day | INTRAVENOUS | Status: AC
  Administered 2019-12-31 – 2020-01-03 (×4): 100 mg via INTRAVENOUS
  Filled 2019-12-30 (×2): qty 20
  Filled 2019-12-30: qty 100
  Filled 2019-12-30: qty 20

## 2019-12-30 MED ORDER — POTASSIUM CHLORIDE 10 MEQ/100ML IV SOLN
10.0000 meq | INTRAVENOUS | Status: AC
  Administered 2019-12-30 (×2): 10 meq via INTRAVENOUS
  Filled 2019-12-30 (×2): qty 100

## 2019-12-30 MED ORDER — MAGNESIUM SULFATE 2 GM/50ML IV SOLN
2.0000 g | Freq: Once | INTRAVENOUS | Status: AC
  Administered 2019-12-30: 2 g via INTRAVENOUS

## 2019-12-30 MED ORDER — PANTOPRAZOLE SODIUM 40 MG IV SOLR
40.0000 mg | Freq: Every day | INTRAVENOUS | Status: DC
  Administered 2019-12-31 – 2020-01-11 (×13): 40 mg via INTRAVENOUS
  Filled 2019-12-30 (×13): qty 40

## 2019-12-30 MED ORDER — CISATRACURIUM BOLUS VIA INFUSION
0.0500 mg/kg | INTRAVENOUS | Status: DC | PRN
  Filled 2019-12-30: qty 4

## 2019-12-30 MED ORDER — POTASSIUM CHLORIDE 20 MEQ/15ML (10%) PO SOLN
40.0000 meq | Freq: Once | ORAL | Status: AC
  Administered 2019-12-30: 22:00:00 40 meq via ORAL
  Filled 2019-12-30: qty 30

## 2019-12-30 MED ORDER — ARTIFICIAL TEARS OPHTHALMIC OINT
1.0000 "application " | TOPICAL_OINTMENT | Freq: Three times a day (TID) | OPHTHALMIC | Status: DC
  Administered 2019-12-31 – 2020-01-02 (×7): 1 via OPHTHALMIC
  Filled 2019-12-30 (×2): qty 3.5

## 2019-12-30 MED ORDER — SODIUM CHLORIDE 0.9 % IV SOLN
200.0000 mg | Freq: Once | INTRAVENOUS | Status: AC
  Administered 2019-12-31: 200 mg via INTRAVENOUS
  Filled 2019-12-30: qty 40

## 2019-12-30 MED ORDER — ACETAMINOPHEN 650 MG RE SUPP
650.0000 mg | Freq: Once | RECTAL | Status: AC
  Administered 2019-12-30: 650 mg via RECTAL
  Filled 2019-12-30: qty 1

## 2019-12-30 MED ORDER — MIDAZOLAM BOLUS VIA INFUSION
1.0000 mg | INTRAVENOUS | Status: DC | PRN
  Filled 2019-12-30: qty 1

## 2019-12-30 MED ORDER — VANCOMYCIN HCL IN DEXTROSE 750-5 MG/150ML-% IV SOLN: 750.0000 mg | INTRAVENOUS | Status: DC

## 2019-12-30 MED ORDER — POTASSIUM CHLORIDE 10 MEQ/100ML IV SOLN
10.0000 meq | Freq: Once | INTRAVENOUS | Status: AC
  Administered 2019-12-30: 21:00:00 10 meq via INTRAVENOUS
  Filled 2019-12-30: qty 100

## 2019-12-30 MED ORDER — ASPIRIN EC 81 MG PO TBEC
81.0000 mg | DELAYED_RELEASE_TABLET | Freq: Every day | ORAL | Status: DC
  Administered 2019-12-31 – 2020-01-02 (×3): 81 mg via ORAL
  Filled 2019-12-30 (×3): qty 1

## 2019-12-30 MED ORDER — SODIUM CHLORIDE 0.9 % IV BOLUS
500.0000 mL | Freq: Once | INTRAVENOUS | Status: AC
  Administered 2019-12-30: 500 mL via INTRAVENOUS

## 2019-12-30 MED ORDER — SODIUM CHLORIDE 0.9 % IV SOLN: 2.0000 g | INTRAVENOUS | Status: DC

## 2019-12-30 MED ORDER — ASPIRIN 300 MG RE SUPP
300.0000 mg | RECTAL | Status: AC
  Administered 2019-12-30: 23:00:00 300 mg via RECTAL
  Filled 2019-12-30: qty 1

## 2019-12-30 MED ORDER — SODIUM CHLORIDE 0.9 % IV SOLN
INTRAVENOUS | Status: AC | PRN
  Administered 2019-12-30: 1000 mL via INTRAVENOUS

## 2019-12-30 MED ORDER — NOREPINEPHRINE 4 MG/250ML-% IV SOLN
0.0000 ug/min | INTRAVENOUS | Status: DC
  Administered 2019-12-31: 1 ug/min via INTRAVENOUS
  Filled 2019-12-30: qty 250

## 2019-12-30 MED ORDER — MIDAZOLAM 50MG/50ML (1MG/ML) PREMIX INFUSION
2.0000 mg/h | INTRAVENOUS | Status: DC
  Administered 2019-12-31: 2 mg/h via INTRAVENOUS
  Administered 2019-12-31 (×2): 3 mg/h via INTRAVENOUS
  Administered 2019-12-31: 6 mg/h via INTRAVENOUS
  Filled 2019-12-30 (×4): qty 50

## 2019-12-30 MED ORDER — FENTANYL BOLUS VIA INFUSION
25.0000 ug | INTRAVENOUS | Status: DC | PRN
  Filled 2019-12-30: qty 25

## 2019-12-30 MED ORDER — MIDAZOLAM HCL 2 MG/2ML IJ SOLN
1.0000 mg | Freq: Once | INTRAMUSCULAR | Status: AC
  Administered 2019-12-30: 1 mg via INTRAVENOUS
  Filled 2019-12-30: qty 2

## 2019-12-30 MED ORDER — SODIUM CHLORIDE 0.9 % IV SOLN
2.0000 g | Freq: Once | INTRAVENOUS | Status: AC
  Administered 2019-12-30: 2 g via INTRAVENOUS
  Filled 2019-12-30: qty 2

## 2019-12-30 MED ORDER — FENTANYL 2500MCG IN NS 250ML (10MCG/ML) PREMIX INFUSION
100.0000 ug/h | INTRAVENOUS | Status: DC
  Administered 2019-12-30: 100 ug/h via INTRAVENOUS
  Administered 2019-12-31 – 2020-01-02 (×4): 200 ug/h via INTRAVENOUS
  Filled 2019-12-30 (×5): qty 250

## 2019-12-30 MED ORDER — HEPARIN SODIUM (PORCINE) 5000 UNIT/ML IJ SOLN
5000.0000 [IU] | Freq: Three times a day (TID) | INTRAMUSCULAR | Status: DC
  Administered 2019-12-31: 5000 [IU] via SUBCUTANEOUS
  Filled 2019-12-30: qty 1

## 2019-12-30 MED ORDER — ROCURONIUM BROMIDE 50 MG/5ML IV SOLN
INTRAVENOUS | Status: AC | PRN
  Administered 2019-12-30: 70 mg via INTRAVENOUS

## 2019-12-30 MED ORDER — VANCOMYCIN HCL IN DEXTROSE 1-5 GM/200ML-% IV SOLN
1000.0000 mg | Freq: Once | INTRAVENOUS | Status: AC
  Administered 2019-12-30: 1000 mg via INTRAVENOUS
  Filled 2019-12-30: qty 200

## 2019-12-30 NOTE — ED Notes (Signed)
Zoll pads placed on pt.

## 2019-12-30 NOTE — Progress Notes (Signed)
Pharmacy Antibiotic Note  Patricia [name] is a 64 y.o. female admitted on 12/12/2019 with sepsis.  Pharmacy has been consulted for vancomycin and cefepime dosing. Pt with tmax 100.5 and WBC is elevated at 12.4. Pt with history of ESRD on HD. Unclear if she was able to complete HD today.   Plan: Vancomycin 1gm IV x 1 then 750mg  post-HD Cefepime 2gm IV x 1 then 2gm post-HD F/u renal plans, C&S, clinical status and pre-HD vanc level PRN  Height: 5\' 4"  (162.6 cm) IBW/kg (Calculated) : 54.7  Temp (24hrs), Avg:100.5 F (38.1 C), Min:100.5 F (38.1 C), Max:100.5 F (38.1 C)  Recent Labs  Lab 01/05/2020 2007 12/29/2019 2015  WBC 12.3*  --   CREATININE  --  2.40*    CrCl cannot be calculated (Unknown ideal weight.).    Allergies  Allergen Reactions  . Codone [Hydrocodone] Itching  . Norvasc [Amlodipine Besylate] Swelling    Lower extremity  . Hydralazine Swelling and Other (See Comments)    Leg swelling. Pt takes this as o/p  . Sulfa Antibiotics Itching and Rash    Antimicrobials this admission: Vanc 3/20>> Cefepime 3/20>> Flagyl x 1 3/20  Dose adjustments this admission: N/A  Microbiology results: Pending  Thank you for allowing pharmacy to be a part of this patient's care.  Shraddha Lebron, Rande Lawman 01/08/2020 9:30 PM

## 2019-12-30 NOTE — ED Notes (Signed)
Rectal temperature 100.5. MD informed. MD to place order for IV antibiotics.

## 2019-12-30 NOTE — H&P (Signed)
NAME:  Patricia [name], MRN:  [id number], DOB:  [birth year], LOS: 0 ADMISSION DATE:  01/01/2020, CONSULTATION DATE:  12/12/2019 REFERRING MD:  Patricia [name] CHIEF COMPLAINT:  Cardiac Arrest   Brief History   64 y.o. F with PMH ESRD, anemia, HFrEF, Depression, Type 2 DM, HTN and obesity who presented in cardiac arrest.  Pt was in her usual state of health and went to dialysis today where she became nauseated and vomited, at home she had a syncopal episode and collapse. CPR initiated and ROSC after 81mns.    History of present illness   Patricia [name] a 64y.o. F with PMH of  ESRD, anemia, HFrEF, Depression, Type 2 DM, HTN and obesity who presented in cardiac arrest. She lives with family and they state she has been in her usual state of health and went to dialysis today where she had an episode of nausea and vomiting and returned home.  There she became sweaty and collapsed, family started CPR immediately and called EMS who performed 15 minutes of CPR with ROSC, no mention of shockable rhythm.    In the ED, pt was not initially responsive and was intubated.  CT head was without acute findings, patient was hypertensive and febrile with a potassium of 2.8 and prolonged QTC.  No sign of STEMI on EKG.  Lactic acid was elevated and chest x-ray showed vascular congestion.  She was given 2 g of magnesium, broad-spectrum antibiotics and IV fluids.  Covid-19 resulted positive.  PCCM consulted for admission.  Family states that she has showed no symptoms of Covid, and they are unsure how she may have contracted this.  Past Medical History   has a past medical history of Allergy, Anemia, Anxiety, Blood transfusion without reported diagnosis, Cardiomyopathy ([place] (11/2016), CHF (congestive heart failure) ([place], CKD (chronic kidney disease), stage IV ([place], Depression, Diabetes mellitus without complication ([place], Diverticulitis, Dyspnea, Hypertension, and Obesity.   Significant Hospital Events   3/20 admit to  PCCM  Consults:  Nephrology  Procedures:  3/20 ET tube  Significant Diagnostic Tests:  3/20 CT head>> no acute findings  Micro Data:  3/20 SARS-Cov-2>> positive  Antimicrobials:  Cefepime 3/20- Flagyl 3/20 only Vancomycin 3/20-  Interim history/subjective:  Patient remained unresponsive over an hour after RSI medications.  Objective   Blood pressure (!) 193/75, pulse 79, temperature 97.6 F (36.4 C), resp. rate 18, height 5' 4"  (1.626 m), weight 62.6 kg, SpO2 100 %.    Vent Mode: PRVC FiO2 (%):  [40 %-100 %] 40 % Set Rate:  [16 bmp] 16 bmp Vt Set:  [430 mL] 430 mL PEEP:  [5 cmH20] 5 cmH20 Plateau Pressure:  [19 cmH20] 19 cmH20   Intake/Output Summary (Last 24 hours) at 12/25/2019 2333 Last data filed at 12/23/2019 2218 Gross per 24 hour  Intake --  Output 55 ml  Net -55 ml   Filed Weights   12/21/2019 2152  Weight: 62.6 kg     Resolved Hospital Problem list     Assessment & Plan:   Acute cardiopulmonary arrest -Possibly related to electrolyte disturbance prolonged QTC worsening heart failure, no signs of acute ischemia on EKG and troponin mildly elevated -ROSC obtained after 15 minutes of CPR -CT head without acute findings P: -Continue aspirin -Initiate targeted temperature management 33 degrees -Analgesia and paralysis with fentanyl, Versed and Nimbex -EEG -Echocardiogram and trend troponins -Maintain full vent support with SAT/SBT as tolerated -titrate Vent setting to maintain SpO2 greater than or equal to 90%. -HOB  elevated 30 degrees. -Plateau pressures less than 30 cm H20.  -Follow chest x-ray, ABG prn.   -Bronchial hygiene and RT/bronchodilator protocol.    Covid-19 infection -Patient with low-grade fever on arrival, no URI symptoms per family unclear if contributory at all to today's events P: -Start remdesivir and dexamethasone -Obtain inflammatory labs -Given she is mechanically ventilated give Tocilizumab if CRP>75 -She was covered  with broad-spectrum antibiotics in the ED, continue Vanco and cefepime for now, though suspect fever and lactic acidosis are secondary to Covid-19 and chest compressions rather than sepsis physiology -Follow blood cultures  HFrEF, hypertension -Echocardiogram 1 year ago with EF of 40 to 45% -She has recently been taken off all home antihypertensive medication except hydralazine due to borderline low blood pressure -Follows with Dr. Sallyanne [name] P: -Patient appears fairly euvolemic, check BNP and echocardiogram -Monitor for volume overload received 1 L IV fluids in the ED    Hypokalemia, prolonged QTC -Qtc 617 -Per cardiology note she has a history of prolonged QT interval, usually 480-510 ms -Last stress test in 2018 did not show evidence of ischemia she does not have a history of ventricular arrhythmia P: -Given 30 meq's in the ED and 2g Magnesium.  Recheck K and mag overnight and replete as needed -Repeat EKG in the a.m. -Avoid QT prolonging medications    ESRD -T/TH/sat schedule -Per family it sounds like she completed most of her dialysis today P: -We will need nephrology consult, no indication for emergent dialysis -Continue to monitor electrolytes and volume status    Best practice:  Diet: N.p.o. Pain/Anxiety/Delirium protocol (if indicated): Fentanyl Versed VAP protocol (if indicated): HOB 30 degrees, daily SBT as sedation will allow DVT prophylaxis: Heparin GI prophylaxis: Protonix Glucose control: SSI Mobility: Bedrest Code Status: Full code confirmed with family Family Communication: Spoke with patient's daughters and brother Disposition: Intensive care  Labs   CBC: Recent Labs  Lab 01/04/2020 2007 01/04/2020 2015 12/29/2019 2046  WBC 12.3*  --   --   NEUTROABS 8.5*  --   --   HGB 7.1* 7.1*  7.5* 5.8*  HCT 23.6* 21.0*  22.0* 17.0*  MCV 97.5  --   --   PLT 213  --   --     Basic Metabolic Panel: Recent Labs  Lab 01/05/2020 2007 12/15/2019 2015  12/24/2019 2046  NA 136 135  134* 136  K 3.0* 2.9*  2.8* 2.3*  CL 97* 95*  --   CO2 22  --   --   GLUCOSE 205* 198*  --   BUN 14 13  --   CREATININE 2.65* 2.40*  --   CALCIUM 7.0*  --   --   MG 1.7  --   --    GFR: Estimated Creatinine Clearance: 21.3 mL/min (A) (by C-G formula based on SCr of 2.4 mg/dL (H)). Recent Labs  Lab 12/21/2019 2007 01/07/2020 2243  WBC 12.3*  --   LATICACIDVEN 6.2* 1.6    Liver Function Tests: Recent Labs  Lab 01/01/2020 2007  AST 105*  ALT 45*  ALKPHOS 70  BILITOT 0.5  PROT 5.4*  ALBUMIN 1.8*   No results for input(s): LIPASE, AMYLASE in the last 168 hours. No results for input(s): AMMONIA in the last 168 hours.  ABG    Component Value Date/Time   PHART 7.601 (HH) 01/09/2020 2046   PCO2ART 29.7 (L) 12/26/2019 2046   PO2ART 353.0 (H) 12/18/2019 2046   HCO3 29.1 (H) 12/19/2019 2046   TCO2 30  12/22/2019 2046   O2SAT 100.0 01/02/2020 2046     Coagulation Profile: Recent Labs  Lab 12/15/2019 2007  INR 1.1    Cardiac Enzymes: No results for input(s): CKTOTAL, CKMB, CKMBINDEX, TROPONINI in the last 168 hours.  HbA1C: HbA1c, POC (prediabetic range)  Date/Time Value Ref Range Status  10/18/2018 10:39 AM 6.0 5.7 - 6.4 % Final   Hgb A1c MFr Bld  Date/Time Value Ref Range Status  11/20/2019 01:00 PM 7.4 (H) 4.8 - 5.6 % Final    Comment:    (NOTE)         Prediabetes: 5.7 - 6.4         Diabetes: >6.4         Glycemic control for adults with diabetes: <7.0   06/16/2019 09:20 PM 7.0 (H) 4.8 - 5.6 % Final    Comment:    (NOTE) Pre diabetes:          5.7%-6.4% Diabetes:              >6.4% Glycemic control for   <7.0% adults with diabetes     CBG: Recent Labs  Lab 12/14/2019 2010  GLUCAP 196*    Review of Systems:   Unable to obtain secondary to intubation  Past Medical History  She,  has a past medical history of Allergy, Anemia, Anxiety, Blood transfusion without reported diagnosis, Cardiomyopathy ([place]) (11/2016), CHF  (congestive heart failure) ([place]), CKD (chronic kidney disease), stage IV ([place]), Depression, Diabetes mellitus without complication ([place]), Diverticulitis, Dyspnea, Hypertension, and Obesity.   Surgical History    Past Surgical History:  Procedure Laterality Date  . ABDOMINAL HYSTERECTOMY    . BREAST SURGERY     reduction  . [place]  . IR FLUORO GUIDE CV LINE RIGHT  11/20/2019  . IR FLUORO GUIDE CV LINE RIGHT  11/23/2019  . IR US GUIDE VASC ACCESS RIGHT  11/20/2019  . LAPAROSCOPY N/A 06/16/2019   Procedure: primary closure of umbilical hernia;  Surgeon: Ralene Ok, MD;  Location: [facility];  Service: General;  Laterality: N/A;  . LIPOMA EXCISION     back  Dr. Excell Seltzer 03-22-18  . LIPOMA EXCISION N/A 03/22/2018   Procedure: EXCISION OF BACK LIPOMA;  Surgeon: Excell Seltzer, MD;  Location: [facility];  Service: General;  Laterality: N/A;  . REDUCTION MAMMAPLASTY Bilateral      Social History   reports that she has never smoked. She has never used smokeless tobacco. She reports that she does not drink alcohol or use drugs.   Family History   Her family history includes Diabetes in her brother; Heart Problems in her maternal grandmother; Heart disease in her maternal grandfather; Kidney disease in her brother; Stomach cancer in her brother. There is no history of Rectal cancer, Esophageal cancer, Liver cancer, or Colon cancer.   Allergies Allergies  Allergen Reactions  . Codone [Hydrocodone] Itching  . Norvasc [Amlodipine Besylate] Swelling    Lower extremity  . Hydralazine Swelling and Other (See Comments)    Leg swelling. Pt takes this as o/p  . Sulfa Antibiotics Itching and Rash     Home Medications  Prior to Admission medications   Medication Sig Start Date End Date Taking? Authorizing Provider  ACCU-CHEK FASTCLIX LANCETS MISC Uad tid Patient taking differently: 1 each by Other route 3 (three) times daily.  10/21/18   Ladell Pier, MD  acetaminophen  (TYLENOL) 325 MG tablet Take 2 tablets (650 mg total) by mouth every 6 (six)  hours as needed. Patient taking differently: Take 650 mg by mouth every 6 (six) hours as needed for headache (pain).  05/25/19   Varney Biles, MD  albuterol (PROVENTIL HFA;VENTOLIN HFA) 108 (90 Base) MCG/ACT inhaler Inhale 1-2 puffs into the lungs every 6 (six) hours as needed for wheezing or shortness of breath. Patient not taking: Reported on 11/19/2019 10/11/18   Marney Setting, NP  Amino Acids-Protein Hydrolys (FEEDING SUPPLEMENT, PRO-STAT SUGAR FREE 64,) LIQD Take 30 mLs by mouth 2 (two) times daily. 10/18/19   Swayze, Ava, DO  aspirin EC 81 MG tablet Take 1 tablet (81 mg total) by mouth daily. 01/04/17   Maren Reamer, MD  atorvastatin (LIPITOR) 20 MG tablet Take 1/2 (one-half) tablet by mouth once daily Patient taking differently: Take 10 mg by mouth daily.  06/12/19   Ladell Pier, MD  Blood Glucose Monitoring Suppl (ACCU-CHEK GUIDE) w/Device KIT 1 each by Does not apply route 3 (three) times daily. Use tid as directed 10/21/18   Ladell Pier, MD  calcitRIOL (ROCALTROL) 0.25 MCG capsule Take 0.25 mcg by mouth daily. 09/12/19   [provider]  carvedilol (COREG) 25 MG tablet TAKE 1 TABLET BY MOUTH TWICE DAILY WITH A MEAL Patient taking differently: Take 25 mg by mouth 2 (two) times daily with a meal.  05/04/19   Croitoru, Mihai, MD  citalopram (CELEXA) 20 MG tablet Take 1 tablet (20 mg total) by mouth daily. 12/04/19   Ladell Pier, MD  gabapentin (NEURONTIN) 100 MG capsule TAKE 1 CAPSULE (100 MG TOTAL) BY MOUTH AT BEDTIME. 12/05/18   Ladell Pier, MD  gentamicin cream (GARAMYCIN) 0.1 % Apply 1 application topically See admin instructions. Apply topically to dialysis site after showering - every other day 06/10/19   [provider]  glucose blood (ACCU-CHEK GUIDE) test strip Use as instructed tid Patient taking differently: 1 each by Other route 3 (three) times daily.  10/21/18    Ladell Pier, MD  hydrALAZINE (APRESOLINE) 25 MG tablet Take 1 tablet (25 mg total) by mouth 2 (two) times daily. 12/04/19   Ladell Pier, MD  hydrOXYzine (ATARAX/VISTARIL) 25 MG tablet Take 1 tablet (25 mg total) by mouth daily as needed for anxiety. 12/04/19   Ladell Pier, MD  insulin aspart (NOVOLOG FLEXPEN) 100 UNIT/ML FlexPen Give 2 units with meals if blood sugar > or =300 12/04/19   Ladell Pier, MD  Insulin Glargine (LANTUS SOLOSTAR) 100 UNIT/ML Solostar Pen Inject 6 Units into the skin at bedtime. 10/18/19   Swayze, Ava, DO  Insulin Pen Needle (TRUEPLUS PEN NEEDLES) 32G X 4 MM MISC Use as directed to inject insulin. Patient taking differently: 1 each by Other route daily.  03/25/18   Ladell Pier, MD  Insulin Pen Needle 31G X 5 MM MISC Use as directed Patient taking differently: 1 each by Other route daily.  03/28/18   Ladell Pier, MD  multivitamin (RENA-VIT) TABS tablet Take 1 tablet by mouth at bedtime. 11/25/19   Raiford Noble Latif, DO  oxyCODONE (OXY IR/ROXICODONE) 5 MG immediate release tablet Take 1 tablet (5 mg total) by mouth every 4 (four) hours as needed for moderate pain or breakthrough pain (Hold & Call MD if SBP<90, HR<65, RR<10, O2<90, or altered mental status.). 10/18/19   Swayze, Ava, DO  Polyethyl Glycol-Propyl Glycol (SYSTANE) 0.4-0.3 % SOLN Place 1 drop into both eyes 3 (three) times daily as needed (dry eyes).     [provider]  promethazine (PHENERGAN) 25 MG tablet Take 25 mg by mouth daily as needed for nausea.  11/17/19   [provider]  sevelamer (RENAGEL) 800 MG tablet Take 1,600-3,200 mg by mouth See admin instructions. Take 2-4 tablets (1600-3200 mg) by mouth up to three times daily with meals - 2-3 tablets with small meal, 4 tablets with large meal    [provider]     Critical care time: 65 minutes       CRITICAL CARE Performed by: Otilio Carpen Scotlyn Mccranie   Total critical care time: 65  minutes  Critical care time was exclusive of separately billable procedures and treating other patients.  Critical care was necessary to treat or prevent imminent or life-threatening deterioration.  Critical care was time spent personally by me on the following activities: development of treatment plan with patient and/or surrogate as well as nursing, discussions with consultants, evaluation of patient's response to treatment, examination of patient, obtaining history from patient or surrogate, ordering and performing treatments and interventions, ordering and review of laboratory studies, ordering and review of radiographic studies, pulse oximetry and re-evaluation of patient's condition.   Otilio Carpen Trek Kimball, PA-C

## 2019-12-30 NOTE — ED Notes (Signed)
Pt COVID positive. Initiated airborne and contact precautions at this time. CC MD at bedside informed. EDP: Verta Ellen and [place] Informed.

## 2019-12-30 NOTE — ED Provider Notes (Signed)
[facility] EMERGENCY DEPARTMENT Provider Note   CSN: [id number] Arrival date & time: 01/04/2020  2000     History No chief complaint on file.   Patricia [name] is a 64 y.o. female.  HPI Patient is a 63 year old female with a PMH of hypertension, ESRD (TThS), T2DM presenting to the ED today following cardiac arrest.  Per EMS, fire arrived to patient's home first and they found patient pulseless and apneic.  Family had started CPR prior to their arrival.  Fire department placed patient on AED which recommended a shock.  They continued CPR for approximately 15 minutes with 2 doses of epi.  EMS reports PEA arrest on all further pulse checks until achieving ROSC.  Patient received a total of 14 minutes of CPR.  She received 300 cc IV fluids and placed on epi drip with EMS.  She arrives with Igel in place.    Past Medical History:  Diagnosis Date  . Allergy   . Anemia   . Anxiety   . Blood transfusion without reported diagnosis   . Cardiomyopathy ([place]) 11/2016   no ischemic eval due to CKD  . CHF (congestive heart failure) ([place])   . CKD (chronic kidney disease), stage IV ([place])   . Depression   . Diabetes mellitus without complication ([place])    type 2  . Diverticulitis   . Dyspnea   . Hypertension   . Obesity     Patient Active Problem List   Diagnosis Date Noted  . Major depressive disorder, recurrent severe without psychotic features ([place]) 11/20/2019  . Generalized weakness 11/19/2019  . Acute encephalopathy 11/18/2019  . Sigmoid diverticulitis 10/10/2019  . ESRD on peritoneal dialysis ([place]) 07/06/2019  . Hypokalemia 06/16/2019  . Periumbilical hernia 87/56/4332  . ESRD (end stage renal disease) ([place]) 01/20/2019  . Chronic combined systolic and diastolic CHF (congestive heart failure) ([place]) 12/06/2018  . Fluid overload 12/05/2018  . Type II diabetes mellitus with renal manifestations ([place]) 12/05/2018  . Hypoglycemia 12/05/2018  . Acute on chronic combined  systolic and diastolic CHF (congestive heart failure) ([place]) 06/14/2018  . CKD (chronic kidney disease) stage 5, GFR less than 15 ml/min (HCC) 06/14/2018  . Anemia due to stage 5 chronic kidney disease, not on chronic dialysis ([place]) 06/14/2018  . Systolic CHF ([place]) 95/18/8416  . Diabetes mellitus type 2 with complications ([place]) 60/63/0160  . Transaminasemia 11/29/2016  . Cardiomyopathy ([place]) 11/12/2016  . Lipoma 10/21/2016  . CKD (chronic kidney disease) stage 4, GFR 15-29 ml/min (HCC) 08/10/2016  . Anxiety and depression 12/12/2015  . Diabetic nephropathy associated with type 2 diabetes mellitus ([place]) 11/15/2014  . Moderate nonproliferative diabetic retinopathy(362.05) 04/17/2014  . 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 Hypertension associated with diabetes ([place]) 03/28/2014  . Dental caries 03/28/2014  . Dyslipidemia 04/18/2013    Past Surgical History:  Procedure Laterality Date  . ABDOMINAL HYSTERECTOMY    . BREAST SURGERY     reduction  . [facility]  . IR FLUORO GUIDE CV LINE RIGHT  11/20/2019  . IR FLUORO GUIDE CV LINE RIGHT  11/23/2019  . IR US GUIDE VASC ACCESS RIGHT  11/20/2019  . LAPAROSCOPY N/A 06/16/2019   Procedure: primary closure of umbilical hernia;  Surgeon: Ralene Ok, MD;  Location: [facility];  Service: General;  Laterality: N/A;  . LIPOMA EXCISION     back  Dr. Excell Seltzer 03-22-18  . LIPOMA EXCISION N/A 03/22/2018   Procedure: EXCISION OF BACK LIPOMA;  Surgeon:  Excell Seltzer, MD;  Location: [facility];  Service: General;  Laterality: N/A;  . REDUCTION MAMMAPLASTY Bilateral      OB History    Gravida  2   Para  2   Term  2   Preterm      AB      Living  2     SAB      TAB      Ectopic      Multiple      Live Births              Family History  Problem Relation Age of Onset  . Diabetes Brother   . Stomach cancer Brother   . Kidney disease Brother   . Heart Problems Maternal Grandmother        pacemaker  . Heart  disease Maternal Grandfather   . Rectal cancer Neg Hx   . Esophageal cancer Neg Hx   . Liver cancer Neg Hx   . Colon cancer Neg Hx     Social History   Tobacco Use  . Smoking status: Never Smoker  . Smokeless tobacco: Never Used  Substance Use Topics  . Alcohol use: No  . Drug use: No    Home Medications Prior to Admission medications   Medication Sig Start Date End Date Taking? Authorizing Provider  ACCU-CHEK FASTCLIX LANCETS MISC Uad tid Patient taking differently: 1 each by Other route 3 (three) times daily.  10/21/18   Ladell Pier, MD  acetaminophen (TYLENOL) 325 MG tablet Take 2 tablets (650 mg total) by mouth every 6 (six) hours as needed. Patient taking differently: Take 650 mg by mouth every 6 (six) hours as needed for headache (pain).  05/25/19   Varney Biles, MD  albuterol (PROVENTIL HFA;VENTOLIN HFA) 108 (90 Base) MCG/ACT inhaler Inhale 1-2 puffs into the lungs every 6 (six) hours as needed for wheezing or shortness of breath. Patient not taking: Reported on 11/19/2019 10/11/18   Marney Setting, NP  Amino Acids-Protein Hydrolys (FEEDING SUPPLEMENT, PRO-STAT SUGAR FREE 64,) LIQD Take 30 mLs by mouth 2 (two) times daily. 10/18/19   Swayze, Ava, DO  aspirin EC 81 MG tablet Take 1 tablet (81 mg total) by mouth daily. 01/04/17   Maren Reamer, MD  atorvastatin (LIPITOR) 20 MG tablet Take 1/2 (one-half) tablet by mouth once daily Patient taking differently: Take 10 mg by mouth daily.  06/12/19   Ladell Pier, MD  Blood Glucose Monitoring Suppl (ACCU-CHEK GUIDE) w/Device KIT 1 each by Does not apply route 3 (three) times daily. Use tid as directed 10/21/18   Ladell Pier, MD  calcitRIOL (ROCALTROL) 0.25 MCG capsule Take 0.25 mcg by mouth daily. 09/12/19   [provider]  carvedilol (COREG) 25 MG tablet TAKE 1 TABLET BY MOUTH TWICE DAILY WITH A MEAL Patient taking differently: Take 25 mg by mouth 2 (two) times daily with a meal.  05/04/19   Croitoru,  Mihai, MD  citalopram (CELEXA) 20 MG tablet Take 1 tablet (20 mg total) by mouth daily. 12/04/19   Ladell Pier, MD  gabapentin (NEURONTIN) 100 MG capsule TAKE 1 CAPSULE (100 MG TOTAL) BY MOUTH AT BEDTIME. 12/05/18   Ladell Pier, MD  gentamicin cream (GARAMYCIN) 0.1 % Apply 1 application topically See admin instructions. Apply topically to dialysis site after showering - every other day 06/10/19   [provider]  glucose blood (ACCU-CHEK GUIDE) test strip Use as instructed tid Patient taking differently: 1  each by Other route 3 (three) times daily.  10/21/18   Ladell Pier, MD  hydrALAZINE (APRESOLINE) 25 MG tablet Take 1 tablet (25 mg total) by mouth 2 (two) times daily. 12/04/19   Ladell Pier, MD  hydrOXYzine (ATARAX/VISTARIL) 25 MG tablet Take 1 tablet (25 mg total) by mouth daily as needed for anxiety. 12/04/19   Ladell Pier, MD  insulin aspart (NOVOLOG FLEXPEN) 100 UNIT/ML FlexPen Give 2 units with meals if blood sugar > or =300 12/04/19   Ladell Pier, MD  Insulin Glargine (LANTUS SOLOSTAR) 100 UNIT/ML Solostar Pen Inject 6 Units into the skin at bedtime. 10/18/19   Swayze, Ava, DO  Insulin Pen Needle (TRUEPLUS PEN NEEDLES) 32G X 4 MM MISC Use as directed to inject insulin. Patient taking differently: 1 each by Other route daily.  03/25/18   Ladell Pier, MD  Insulin Pen Needle 31G X 5 MM MISC Use as directed Patient taking differently: 1 each by Other route daily.  03/28/18   Ladell Pier, MD  multivitamin (RENA-VIT) TABS tablet Take 1 tablet by mouth at bedtime. 11/25/19   Raiford Noble Latif, DO  oxyCODONE (OXY IR/ROXICODONE) 5 MG immediate release tablet Take 1 tablet (5 mg total) by mouth every 4 (four) hours as needed for moderate pain or breakthrough pain (Hold & Call MD if SBP<90, HR<65, RR<10, O2<90, or altered mental status.). 10/18/19   Swayze, Ava, DO  Polyethyl Glycol-Propyl Glycol (SYSTANE) 0.4-0.3 % SOLN Place 1 drop into both eyes 3  (three) times daily as needed (dry eyes).     [provider]  promethazine (PHENERGAN) 25 MG tablet Take 25 mg by mouth daily as needed for nausea.  11/17/19   [provider]  sevelamer (RENAGEL) 800 MG tablet Take 1,600-3,200 mg by mouth See admin instructions. Take 2-4 tablets (1600-3200 mg) by mouth up to three times daily with meals - 2-3 tablets with small meal, 4 tablets with large meal    [provider]    Allergies    Codone [hydrocodone], Norvasc [amlodipine besylate], Hydralazine, and Sulfa antibiotics  Review of Systems   Review of Systems  Unable to perform ROS: Acuity of condition    Physical Exam Updated Vital Signs BP (!) 154/80   Pulse 78   Temp (!) 100.5 F (38.1 C) (Rectal)   Resp 20   Ht '5\' 4"'$  (1.626 m)   SpO2 100%   BMI 24.20 kg/m   Physical Exam Vitals and nursing note reviewed.  Constitutional:      General: She is in acute distress.     Appearance: She is well-developed. She is obese. She is ill-appearing.     Comments: Obtunded.  HENT:     Head: Normocephalic and atraumatic.     Right Ear: External ear normal.     Left Ear: External ear normal.     Nose: Nose normal. No congestion or rhinorrhea.     Mouth/Throat:     Comments: I gel in place. Eyes:     Extraocular Movements: Extraocular movements intact.     Comments: Pupils 3 mm and minimally reactive to light.  Cardiovascular:     Rate and Rhythm: Normal rate and regular rhythm.     Pulses: Normal pulses.  Pulmonary:     Comments: Patient being bagged with I gel in place.  Bilateral mechanical breath sounds. Abdominal:     General: There is no distension.     Palpations: Abdomen is soft.  Musculoskeletal:        General: No signs of injury.     Cervical back: No rigidity.  Skin:    General: Skin is warm and dry.  Neurological:     Comments: Patient obtunded.  She is not moving any extremities spontaneously and is not responding to pain.     ED Results /  Procedures / Treatments   Labs (all labs ordered are listed, but only abnormal results are displayed) Labs Reviewed  RESPIRATORY PANEL BY RT PCR (FLU A&B, COVID) - Abnormal; Notable for the following components:      Result Value   SARS Coronavirus 2 by RT PCR POSITIVE (*)    All other components within normal limits  CBC WITH DIFFERENTIAL/PLATELET - Abnormal; Notable for the following components:   WBC 12.3 (*)    RBC 2.42 (*)    Hemoglobin 7.1 (*)    HCT 23.6 (*)    Neutro Abs 8.5 (*)    Abs Immature Granulocytes 0.49 (*)    All other components within normal limits  COMPREHENSIVE METABOLIC PANEL - Abnormal; Notable for the following components:   Potassium 3.0 (*)    Chloride 97 (*)    Glucose, Bld 205 (*)    Creatinine, Ser 2.65 (*)    Calcium 7.0 (*)    Total Protein 5.4 (*)    Albumin 1.8 (*)    AST 105 (*)    ALT 45 (*)    GFR calc non Af Amer 19 (*)    GFR calc Af Amer 22 (*)    Anion gap 17 (*)    All other components within normal limits  URINALYSIS, ROUTINE W REFLEX MICROSCOPIC - Abnormal; Notable for the following components:   Color, Urine AMBER (*)    APPearance CLOUDY (*)    Glucose, UA 150 (*)    Protein, ur >=300 (*)    Bacteria, UA RARE (*)    All other components within normal limits  LACTIC ACID, PLASMA - Abnormal; Notable for the following components:   Lactic Acid, Venous 6.2 (*)    All other components within normal limits  BASIC METABOLIC PANEL - Abnormal; Notable for the following components:   Sodium 133 (*)    Potassium 2.8 (*)    Chloride 97 (*)    Glucose, Bld 240 (*)    Creatinine, Ser 2.49 (*)    Calcium 6.5 (*)    GFR calc non Af Amer 20 (*)    GFR calc Af Amer 23 (*)    All other components within normal limits  APTT - Abnormal; Notable for the following components:   aPTT 48 (*)    All other components within normal limits  CBG MONITORING, ED - Abnormal; Notable for the following components:   Glucose-Capillary 196 (*)    All  other components within normal limits  I-STAT CHEM 8, ED - Abnormal; Notable for the following components:   Sodium 134 (*)    Potassium 2.8 (*)    Chloride 95 (*)    Creatinine, Ser 2.40 (*)    Glucose, Bld 198 (*)    Calcium, Ion 0.86 (*)    Hemoglobin 7.5 (*)    HCT 22.0 (*)    All other components within normal limits  POCT I-STAT EG7 - Abnormal; Notable for the following components:   pH, Ven 7.570 (*)    pCO2, Ven 26.5 (*)    pO2, Ven 59.0 (*)    Potassium 2.9 (*)  Calcium, Ion 0.85 (*)    HCT 21.0 (*)    Hemoglobin 7.1 (*)    All other components within normal limits  POCT I-STAT 7, (LYTES, BLD GAS, ICA,H+H) - Abnormal; Notable for the following components:   pH, Arterial 7.601 (*)    pCO2 arterial 29.7 (*)    pO2, Arterial 353.0 (*)    Bicarbonate 29.1 (*)    Acid-Base Excess 7.0 (*)    Potassium 2.3 (*)    Calcium, Ion 0.95 (*)    HCT 17.0 (*)    Hemoglobin 5.8 (*)    All other components within normal limits  CBG MONITORING, ED - Abnormal; Notable for the following components:   Glucose-Capillary 222 (*)    All other components within normal limits  TROPONIN I (HIGH SENSITIVITY) - Abnormal; Notable for the following components:   Troponin I (High Sensitivity) 38 (*)    All other components within normal limits  TROPONIN I (HIGH SENSITIVITY) - Abnormal; Notable for the following components:   Troponin I (High Sensitivity) 79 (*)    All other components within normal limits  CULTURE, BLOOD (ROUTINE X 2)  CULTURE, BLOOD (ROUTINE X 2)  MAGNESIUM  RAPID URINE DRUG SCREEN, HOSP PERFORMED  ETHANOL  LACTIC ACID, PLASMA  PROTIME-INR  BLOOD GAS, ARTERIAL  PROTIME-INR  APTT  BLOOD GAS, ARTERIAL  BLOOD GAS, ARTERIAL  CBC  CBC  BASIC METABOLIC PANEL  BLOOD GAS, ARTERIAL  MAGNESIUM  PHOSPHORUS  BRAIN NATRIURETIC PEPTIDE  D-DIMER, QUANTITATIVE (NOT AT [facility])  C-REACTIVE PROTEIN  FERRITIN  HIV ANTIBODY (ROUTINE TESTING W REFLEX)  POTASSIUM  I-STAT ARTERIAL  BLOOD GAS, ED  TYPE AND SCREEN    EKG EKG Interpretation  Date/Time:  Saturday December 30 2019 19:37:90 EDT Ventricular Rate:  81 PR Interval:    QRS Duration: 96 QT Interval:  578 QTC Calculation: 672 R Axis:   52 Text Interpretation: Sinus rhythm Prolonged QT interval similar to earlier in the day Confirmed by Sherwood Gambler [phone]) on 12/18/2019 11:45:58 PM   Radiology DG Chest Portable 1 View  Result Date: 01/10/2020 CLINICAL DATA:  Post resuscitation. EXAM: PORTABLE CHEST 1 VIEW COMPARISON:  Chest radiograph 11/18/2019 FINDINGS: Interval intubation with endotracheal tube tip approximately 3 cm above the carina. Interval placement of a nasogastric tube with distal aspect below the diaphragm out of field of view. Right central venous catheter tip projects at the cavoatrial junction. A chest pad overlies the left chest. Stable cardiomediastinal contours. Mild central congestion without overt edema. No evidence of pneumothorax or large pleural effusion. No acute finding in the visualized skeleton. IMPRESSION: 1. Interval intubation and placement of a nasogastric tube as above. 2. Central vascular congestion without overt edema. Electronically Signed   By: Audie Pinto M.D.   On: 01/06/2020 20:53    Procedures Procedure Name: Intubation Date/Time: 12/31/2019 12:45 AM Performed by: Nadeen Landau, MD Pre-anesthesia Checklist: Patient identified, Patient being monitored and Suction available Preoxygenation: Pre-oxygenation with 100% oxygen Induction Type: Rapid sequence Laryngoscope Size: Glidescope and 3 Grade View: Grade I Tube size: 7.5 mm Number of attempts: 1 Airway Equipment and Method: Video-laryngoscopy Placement Confirmation: ETT inserted through vocal cords under direct vision,  CO2 detector and Breath sounds checked- equal and bilateral Secured at: 23 cm Tube secured with: ETT holder Dental Injury: Injury to lip       (including critical care time)  Medications  Ordered in ED Medications  vancomycin (VANCOCIN) IVPB 750 mg/150 ml premix (has no administration in time range)  ceFEPIme (MAXIPIME) 2 g in sodium chloride 0.9 % 100 mL IVPB (has no administration in time range)  aspirin EC tablet 81 mg (has no administration in time range)  norepinephrine (LEVOPHED) '4mg'$  in 228m premix infusion (has no administration in time range)  cisatracurium (NIMBEX) bolus via infusion 6.3 mg (6.3 mg Intravenous Bolus from Bag 12/23/2019 2341)    And  cisatracurium (NIMBEX) 200 mg in sodium chloride 0.9 % 200 mL (1 mg/mL) infusion (1 mcg/kg/min  62.6 kg Intravenous New Bag/Given 01/10/2020 2340)    And  cisatracurium (NIMBEX) bolus via infusion 3.1 mg (has no administration in time range)  artificial tears (LACRILUBE) ophthalmic ointment 1 application (has no administration in time range)  heparin injection 5,000 Units (has no administration in time range)  pantoprazole (PROTONIX) injection 40 mg (has no administration in time range)  fentaNYL 25042m in NS 2508m74m53ml) infusion-PREMIX (100 mcg/hr Intravenous New Bag/Given 12/19/2019 2340)  fentaNYL (SUBLIMAZE) bolus via infusion 25 mcg (has no administration in time range)  midazolam (VERSED) 50 mg/50 mL (1 mg/mL) premix infusion (2 mg/hr Intravenous New Bag/Given 12/31/19 0006)  midazolam (VERSED) bolus via infusion 1 mg (has no administration in time range)  remdesivir 200 mg in sodium chloride 0.9% 250 mL IVPB (has no administration in time range)    Followed by  remdesivir 100 mg in sodium chloride 0.9 % 100 mL IVPB (has no administration in time range)  dexamethasone (DECADRON) injection 6 mg (has no administration in time range)  magnesium sulfate IVPB 2 g 50 mL (0 g Intravenous Stopped 12/16/2019 2043)  0.9 %  sodium chloride infusion ( Intravenous Stopped 01/06/2020 2137)  etomidate (AMIDATE) injection (20 mg Intravenous Given 01/06/2020 2013)  rocuronium (ZEMURON) injection (70 mg Intravenous Given 12/25/2019 2013)   potassium chloride 10 mEq in 100 mL IVPB (0 mEq Intravenous Stopped 12/17/2019 2215)  potassium chloride 20 MEQ/15ML (10%) solution 40 mEq (40 mEq Oral Given 12/25/2019 2225)  acetaminophen (TYLENOL) suppository 650 mg (650 mg Rectal Given 12/15/2019 2047)  ceFEPIme (MAXIPIME) 2 g in sodium chloride 0.9 % 100 mL IVPB (0 g Intravenous Stopped 12/13/2019 2215)  metroNIDAZOLE (FLAGYL) IVPB 500 mg (0 mg Intravenous Stopped 12/13/2019 2256)  vancomycin (VANCOCIN) IVPB 1000 mg/200 mL premix (0 mg Intravenous Stopped 12/23/2019 2215)  potassium chloride 10 mEq in 100 mL IVPB (10 mEq Intravenous New Bag/Given 01/05/2020 2325)  aspirin suppository 300 mg (300 mg Rectal Given 01/07/2020 2325)  sodium chloride 0.9 % bolus 500 mL (500 mLs Intravenous New Bag/Given 12/21/2019 2343)  fentaNYL (SUBLIMAZE) injection 50 mcg (50 mcg Intravenous Given 01/04/2020 2340)  midazolam (VERSED) injection 1 mg (1 mg Intravenous Given 01/09/2020 2330)  dexamethasone (DECADRON) injection 6 mg (6 mg Intravenous Given 12/29/2019 2331)  sodium chloride 0.9 % bolus 500 mL (0 mLs Intravenous Stopped 12/29/2019 2343)    ED Course  I have reviewed the triage vital signs and the nursing notes.  Pertinent labs & imaging results that were available during my care of the patient were reviewed by me and considered in my medical decision making (see chart for details).    MDM Rules/Calculators/A&P                     Patient is a 63 y35r old female with a PMH of hypertension, ESRD (TThS), T2DM presenting to the ED today following cardiac arrest.  On arrival, patient's airway maintained with Igel and she is being mechanically ventilated.  She has bilateral mechanical breath sounds.  She has palpable  peripheral pulses but appears to have minimal spontaneous breathing.  Initial vital signs are BP 154/80, HR 78, RR 20, SPO2 100% with supraglottic airway.  Temp 100.47F.   Exchanged Igel for ET tube using RSI as documented above.  Patient noted to bite blade prior to  paralysis.  Epi drip discontinued and she maintained her BP.  EKG with sinus rhythm with no signs of ischemia but QTC of 665.  Patient given IV magnesium for prolonged QTC.  Obtained more information from patient's family.  Per patient's daughter, patient had gone to dialysis this morning.  She began experience nausea and vomiting with 50 minutes left in her session.  Patient's daughter took her home and bought her [place].  However, her mom did not appear to have an appetite and did not eat.  While sitting in her chair, patient sat up, curled both of her arms to her chest then daughter noticed her eyes rolled back in her head and she became unresponsive.  Daughter called 52 who talked her through bystander CPR.  Patient's daughter says that she was told that patient passed out and required supplemental O2 at an HD session earlier in the week.  I-STAT with K2.8.  She was given 40 mEq of potassium chloride through her ET tube and IV KCl initiated for hypokalemia.  CBC with WBC 12.3, hemoglobin 7.1.  Lactic acid 6.2.  Given her leukocytosis, lactic acidosis and fever, initiated IV fluids and provided broad-spectrum antibiotics although we have low suspicion that sepsis was the cause of her arrest.  CMP with K3.0, glucose 205, BUN 14, creatinine 2.65, calcium 7.0, protein 5.4, albumin 1.8, AST 105, ALT 45.  Initial troponin 38.  Magnesium 1.7.  Ethanol undetectable.  Obtained CT head which showed no acute intracranial hemorrhage or evidence of acute infarction.  Chest x-ray showed "central vascular congestion without overt edema."  Covid swab positive.  Patient's arrest thought to be V. fib/V. tach as far department AED noted a shockable rhythm.  After discussion with ICU team, cooling initiated.  Patient continues to maintain her SBP in 170s and her HR is in the 80s.  She is not noted to have any movement in her extremities.  At this time, patient admitted to ICU.  For details on hospital course following  admission, please refer to inpatient team's note.   Patient assessed and evaluated with Dr. Regenia Skeeter.  Nadeen Landau, MD   Final Clinical Impression(s) / ED Diagnoses Final diagnoses:  Cardiac arrest [facility])    Rx / DC Orders ED Discharge Orders    None       Nadeen Landau, MD 12/31/19 3825    Sherwood Gambler, MD 12/31/19 [phone]

## 2019-12-30 NOTE — ED Notes (Signed)
Place temp foley. Code Cool initiated per MD order. Placed ice packs to groin and axillary.

## 2019-12-30 NOTE — ED Triage Notes (Addendum)
POST CPR ROSC  Per EMS report, pt attended dialysis today. Uncertain if dialysis complete. Pt began to experience NV X1. Pt returned home when she began to feel tired and hot. Family witnessed collapse. Pt. Began CPR. Chief Financial Officer responded. EMS report 1 shock per fire.   EMS arrived continued CPR. CPR continued for approx 15 minutes. Epi x2. Return of ROSC. HR 165 BP 240/120. King airway in place. 300 mL bolus. Epi drip.

## 2019-12-30 NOTE — Progress Notes (Addendum)
Chaplain engaged family in consult room A, provided prayer, emotional support and drinks.  Facilitated communication with medical staff. Escorted daughters Georgette Shell and Gaylan Gerold to see pt. Pt brother Elberta Fortis is present in waiting area.  Note:  Georgette Shell is a Psychologist, forensic in the renal unit.  Gaylan Gerold was a pt here in June with a heart attack, no STEMI, treated with meds.  She is showing signs of anxiety, working hard to "calm down" as urged by her sister.    Will be available for f/u as needed.   [place], [place]  [address]23 Returned per family request, seeking update status.  Communicated to RN.  Offered ongoing emotional and spiritual support. Family received prayer via telephone from their pastor while chaplain was present. Dau Caiva appeared more calm. They have just left.  Family will seek advice on Covid testing.     12/21/2019 2100  Clinical Encounter Type  Visited With Patient and family together;Patient;Family  Visit Type Initial;Critical Care  Referral From Care management  Consult/Referral To Chaplain  Spiritual Encounters  Spiritual Needs Prayer  Stress Factors  Family Stress Factors Lack of knowledge;Family relationships

## 2019-12-30 NOTE — Sepsis Progress Note (Addendum)
Notified provider and bedside nurse of need to order fluid bolus. All providers in active code situation with patient at this time.  Provider responded that lactic is likely elevated from cardiac arrest, not sepsis and would like to keep a little dry d/t +COVID

## 2019-12-31 ENCOUNTER — Inpatient Hospital Stay (HOSPITAL_COMMUNITY): Payer: Medicare HMO

## 2019-12-31 DIAGNOSIS — R9431 Abnormal electrocardiogram [ECG] [EKG]: Secondary | ICD-10-CM | POA: Insufficient documentation

## 2019-12-31 DIAGNOSIS — I469 Cardiac arrest, cause unspecified: Secondary | ICD-10-CM

## 2019-12-31 DIAGNOSIS — I340 Nonrheumatic mitral (valve) insufficiency: Secondary | ICD-10-CM

## 2019-12-31 DIAGNOSIS — I361 Nonrheumatic tricuspid (valve) insufficiency: Secondary | ICD-10-CM

## 2019-12-31 LAB — COMPREHENSIVE METABOLIC PANEL
ALT: 30 U/L (ref 0–44)
AST: 53 U/L — ABNORMAL HIGH (ref 15–41)
Albumin: 1.3 g/dL — ABNORMAL LOW (ref 3.5–5.0)
Alkaline Phosphatase: 52 U/L (ref 38–126)
Anion gap: 10 (ref 5–15)
BUN: 18 mg/dL (ref 8–23)
CO2: 22 mmol/L (ref 22–32)
Calcium: 7.2 mg/dL — ABNORMAL LOW (ref 8.9–10.3)
Chloride: 104 mmol/L (ref 98–111)
Creatinine, Ser: 2.62 mg/dL — ABNORMAL HIGH (ref 0.44–1.00)
GFR calc Af Amer: 22 mL/min — ABNORMAL LOW (ref >60)
GFR calc non Af Amer: 19 mL/min — ABNORMAL LOW (ref >60)
Glucose, Bld: 157 mg/dL — ABNORMAL HIGH (ref 70–99)
Potassium: 4 mmol/L (ref 3.5–5.1)
Sodium: 136 mmol/L (ref 135–145)
Total Bilirubin: 0.6 mg/dL (ref 0.3–1.2)
Total Protein: 4.3 g/dL — ABNORMAL LOW (ref 6.5–8.1)

## 2019-12-31 LAB — CBC WITH DIFFERENTIAL/PLATELET
Abs Immature Granulocytes: 0.06 10*3/uL (ref 0.00–0.07)
Basophils Absolute: 0 10*3/uL (ref 0.0–0.1)
Basophils Relative: 0 %
Eosinophils Absolute: 0.1 10*3/uL (ref 0.0–0.5)
Eosinophils Relative: 1 %
HCT: 23.7 % — ABNORMAL LOW (ref 36.0–46.0)
Hemoglobin: 7.8 g/dL — ABNORMAL LOW (ref 12.0–15.0)
Immature Granulocytes: 1 %
Lymphocytes Relative: 24 %
Lymphs Abs: 1.1 10*3/uL (ref 0.7–4.0)
MCH: 29.5 pg (ref 26.0–34.0)
MCHC: 32.9 g/dL (ref 30.0–36.0)
MCV: 89.8 fL (ref 80.0–100.0)
Monocytes Absolute: 0.2 10*3/uL (ref 0.1–1.0)
Monocytes Relative: 4 %
Neutro Abs: 3.2 10*3/uL (ref 1.7–7.7)
Neutrophils Relative %: 70 %
Platelets: 155 10*3/uL (ref 150–400)
RBC: 2.64 MIL/uL — ABNORMAL LOW (ref 3.87–5.11)
RDW: 15.6 % — ABNORMAL HIGH (ref 11.5–15.5)
WBC: 4.6 10*3/uL (ref 4.0–10.5)
nRBC: 0 % (ref 0.0–0.2)

## 2019-12-31 LAB — POCT I-STAT 7, (LYTES, BLD GAS, ICA,H+H)
Acid-Base Excess: 1 mmol/L (ref 0.0–2.0)
Acid-Base Excess: 1 mmol/L (ref 0.0–2.0)
Acid-Base Excess: 2 mmol/L (ref 0.0–2.0)
Acid-Base Excess: 2 mmol/L (ref 0.0–2.0)
Acid-base deficit: 1 mmol/L (ref 0.0–2.0)
Bicarbonate: 21.3 mmol/L (ref 20.0–28.0)
Bicarbonate: 22.6 mmol/L (ref 20.0–28.0)
Bicarbonate: 22.8 mmol/L (ref 20.0–28.0)
Bicarbonate: 22.8 mmol/L (ref 20.0–28.0)
Bicarbonate: 23.5 mmol/L (ref 20.0–28.0)
Bicarbonate: 23.6 mmol/L (ref 20.0–28.0)
Bicarbonate: 24 mmol/L (ref 20.0–28.0)
Calcium, Ion: 0.97 mmol/L — ABNORMAL LOW (ref 1.15–1.40)
Calcium, Ion: 0.99 mmol/L — ABNORMAL LOW (ref 1.15–1.40)
Calcium, Ion: 1.05 mmol/L — ABNORMAL LOW (ref 1.15–1.40)
Calcium, Ion: 1.09 mmol/L — ABNORMAL LOW (ref 1.15–1.40)
Calcium, Ion: 1.1 mmol/L — ABNORMAL LOW (ref 1.15–1.40)
Calcium, Ion: 1.11 mmol/L — ABNORMAL LOW (ref 1.15–1.40)
Calcium, Ion: 1.14 mmol/L — ABNORMAL LOW (ref 1.15–1.40)
HCT: 19 % — ABNORMAL LOW (ref 36.0–46.0)
HCT: 21 % — ABNORMAL LOW (ref 36.0–46.0)
HCT: 22 % — ABNORMAL LOW (ref 36.0–46.0)
HCT: 22 % — ABNORMAL LOW (ref 36.0–46.0)
HCT: 23 % — ABNORMAL LOW (ref 36.0–46.0)
HCT: 23 % — ABNORMAL LOW (ref 36.0–46.0)
HCT: 24 % — ABNORMAL LOW (ref 36.0–46.0)
Hemoglobin: 6.5 g/dL — CL (ref 12.0–15.0)
Hemoglobin: 7.1 g/dL — ABNORMAL LOW (ref 12.0–15.0)
Hemoglobin: 7.5 g/dL — ABNORMAL LOW (ref 12.0–15.0)
Hemoglobin: 7.5 g/dL — ABNORMAL LOW (ref 12.0–15.0)
Hemoglobin: 7.8 g/dL — ABNORMAL LOW (ref 12.0–15.0)
Hemoglobin: 7.8 g/dL — ABNORMAL LOW (ref 12.0–15.0)
Hemoglobin: 8.2 g/dL — ABNORMAL LOW (ref 12.0–15.0)
O2 Saturation: 100 %
O2 Saturation: 100 %
O2 Saturation: 100 %
O2 Saturation: 100 %
O2 Saturation: 100 %
O2 Saturation: 100 %
O2 Saturation: 100 %
Patient temperature: 31.6
Patient temperature: 32.6
Patient temperature: 32.7
Patient temperature: 32.7
Patient temperature: 33
Patient temperature: 34.7
Patient temperature: 96.5
Potassium: 2.4 mmol/L — CL (ref 3.5–5.1)
Potassium: 2.6 mmol/L — CL (ref 3.5–5.1)
Potassium: 2.7 mmol/L — CL (ref 3.5–5.1)
Potassium: 2.8 mmol/L — ABNORMAL LOW (ref 3.5–5.1)
Potassium: 3.2 mmol/L — ABNORMAL LOW (ref 3.5–5.1)
Potassium: 3.4 mmol/L — ABNORMAL LOW (ref 3.5–5.1)
Potassium: 3.6 mmol/L (ref 3.5–5.1)
Sodium: 134 mmol/L — ABNORMAL LOW (ref 135–145)
Sodium: 135 mmol/L (ref 135–145)
Sodium: 136 mmol/L (ref 135–145)
Sodium: 136 mmol/L (ref 135–145)
Sodium: 136 mmol/L (ref 135–145)
Sodium: 137 mmol/L (ref 135–145)
Sodium: 137 mmol/L (ref 135–145)
TCO2: 22 mmol/L (ref 22–32)
TCO2: 23 mmol/L (ref 22–32)
TCO2: 24 mmol/L (ref 22–32)
TCO2: 24 mmol/L (ref 22–32)
TCO2: 24 mmol/L (ref 22–32)
TCO2: 24 mmol/L (ref 22–32)
TCO2: 25 mmol/L (ref 22–32)
pCO2 arterial: 18.7 mmHg — CL (ref 32.0–48.0)
pCO2 arterial: 20 mmHg — ABNORMAL LOW (ref 32.0–48.0)
pCO2 arterial: 20.8 mmHg — ABNORMAL LOW (ref 32.0–48.0)
pCO2 arterial: 21.8 mmHg — ABNORMAL LOW (ref 32.0–48.0)
pCO2 arterial: 24.2 mmHg — ABNORMAL LOW (ref 32.0–48.0)
pCO2 arterial: 24.6 mmHg — ABNORMAL LOW (ref 32.0–48.0)
pCO2 arterial: 26.5 mmHg — ABNORMAL LOW (ref 32.0–48.0)
pH, Arterial: 7.54 — ABNORMAL HIGH (ref 7.350–7.450)
pH, Arterial: 7.56 — ABNORMAL HIGH (ref 7.350–7.450)
pH, Arterial: 7.601 (ref 7.350–7.450)
pH, Arterial: 7.602 (ref 7.350–7.450)
pH, Arterial: 7.635 (ref 7.350–7.450)
pH, Arterial: 7.652 (ref 7.350–7.450)
pH, Arterial: 7.674 (ref 7.350–7.450)
pO2, Arterial: 122 mmHg — ABNORMAL HIGH (ref 83.0–108.0)
pO2, Arterial: 134 mmHg — ABNORMAL HIGH (ref 83.0–108.0)
pO2, Arterial: 138 mmHg — ABNORMAL HIGH (ref 83.0–108.0)
pO2, Arterial: 165 mmHg — ABNORMAL HIGH (ref 83.0–108.0)
pO2, Arterial: 170 mmHg — ABNORMAL HIGH (ref 83.0–108.0)
pO2, Arterial: 178 mmHg — ABNORMAL HIGH (ref 83.0–108.0)
pO2, Arterial: 180 mmHg — ABNORMAL HIGH (ref 83.0–108.0)

## 2019-12-31 LAB — GLUCOSE, CAPILLARY
Glucose-Capillary: 195 mg/dL — ABNORMAL HIGH (ref 70–99)
Glucose-Capillary: 193 mg/dL — ABNORMAL HIGH (ref 70–99)
Glucose-Capillary: 193 mg/dL — ABNORMAL HIGH (ref 70–99)
Glucose-Capillary: 196 mg/dL — ABNORMAL HIGH (ref 70–99)
Glucose-Capillary: 179 mg/dL — ABNORMAL HIGH (ref 70–99)
Glucose-Capillary: 182 mg/dL — ABNORMAL HIGH (ref 70–99)
Glucose-Capillary: 164 mg/dL — ABNORMAL HIGH (ref 70–99)
Glucose-Capillary: 164 mg/dL — ABNORMAL HIGH (ref 70–99)
Glucose-Capillary: 157 mg/dL — ABNORMAL HIGH (ref 70–99)
Glucose-Capillary: 154 mg/dL — ABNORMAL HIGH (ref 70–99)
Glucose-Capillary: 149 mg/dL — ABNORMAL HIGH (ref 70–99)
Glucose-Capillary: 131 mg/dL — ABNORMAL HIGH (ref 70–99)
Glucose-Capillary: 111 mg/dL — ABNORMAL HIGH (ref 70–99)
Glucose-Capillary: 101 mg/dL — ABNORMAL HIGH (ref 70–99)

## 2019-12-31 LAB — BASIC METABOLIC PANEL
Anion gap: 14 (ref 5–15)
Anion gap: 10 (ref 5–15)
BUN: 16 mg/dL (ref 8–23)
BUN: 21 mg/dL (ref 8–23)
CO2: 20 mmol/L — ABNORMAL LOW (ref 22–32)
CO2: 21 mmol/L — ABNORMAL LOW (ref 22–32)
Calcium: 6.8 mg/dL — ABNORMAL LOW (ref 8.9–10.3)
Calcium: 7.2 mg/dL — ABNORMAL LOW (ref 8.9–10.3)
Chloride: 100 mmol/L (ref 98–111)
Chloride: 105 mmol/L (ref 98–111)
Creatinine, Ser: 2.48 mg/dL — ABNORMAL HIGH (ref 0.44–1.00)
Creatinine, Ser: 2.74 mg/dL — ABNORMAL HIGH (ref 0.44–1.00)
GFR calc Af Amer: 23 mL/min — ABNORMAL LOW (ref >60)
GFR calc Af Amer: 21 mL/min — ABNORMAL LOW (ref >60)
GFR calc non Af Amer: 20 mL/min — ABNORMAL LOW (ref >60)
GFR calc non Af Amer: 18 mL/min — ABNORMAL LOW (ref >60)
Glucose, Bld: 194 mg/dL — ABNORMAL HIGH (ref 70–99)
Glucose, Bld: 115 mg/dL — ABNORMAL HIGH (ref 70–99)
Potassium: 2.6 mmol/L — CL (ref 3.5–5.1)
Potassium: 4.6 mmol/L (ref 3.5–5.1)
Sodium: 134 mmol/L — ABNORMAL LOW (ref 135–145)
Sodium: 136 mmol/L (ref 135–145)

## 2019-12-31 LAB — ECHOCARDIOGRAM COMPLETE
Height: 64 in
Weight: 2306.89 oz

## 2019-12-31 LAB — APTT
aPTT: 48 seconds — ABNORMAL HIGH (ref 24–36)
aPTT: 50 seconds — ABNORMAL HIGH (ref 24–36)
aPTT: 58 seconds — ABNORMAL HIGH (ref 24–36)

## 2019-12-31 LAB — HIV ANTIBODY (ROUTINE TESTING W REFLEX): HIV Screen 4th Generation wRfx: NONREACTIVE

## 2019-12-31 LAB — PROCALCITONIN: Procalcitonin: 9.78 ng/mL

## 2019-12-31 LAB — CBC
HCT: 20.2 % — ABNORMAL LOW (ref 36.0–46.0)
HCT: 24 % — ABNORMAL LOW (ref 36.0–46.0)
Hemoglobin: 6.5 g/dL — CL (ref 12.0–15.0)
Hemoglobin: 7.9 g/dL — ABNORMAL LOW (ref 12.0–15.0)
MCH: 29.4 pg (ref 26.0–34.0)
MCH: 29.5 pg (ref 26.0–34.0)
MCHC: 32.2 g/dL (ref 30.0–36.0)
MCHC: 32.9 g/dL (ref 30.0–36.0)
MCV: 91.4 fL (ref 80.0–100.0)
MCV: 89.6 fL (ref 80.0–100.0)
Platelets: 185 10*3/uL (ref 150–400)
Platelets: 167 10*3/uL (ref 150–400)
RBC: 2.21 MIL/uL — ABNORMAL LOW (ref 3.87–5.11)
RBC: 2.68 MIL/uL — ABNORMAL LOW (ref 3.87–5.11)
RDW: 14.1 % (ref 11.5–15.5)
RDW: 14.2 % (ref 11.5–15.5)
WBC: 8.4 10*3/uL (ref 4.0–10.5)
WBC: 6.8 10*3/uL (ref 4.0–10.5)
nRBC: 0 % (ref 0.0–0.2)
nRBC: 0.3 % — ABNORMAL HIGH (ref 0.0–0.2)

## 2019-12-31 LAB — MRSA PCR SCREENING: MRSA by PCR: NEGATIVE

## 2019-12-31 LAB — PROTIME-INR
INR: 1.1 (ref 0.8–1.2)
INR: 1.2 (ref 0.8–1.2)
Prothrombin Time: 13.9 seconds (ref 11.4–15.2)
Prothrombin Time: 14.8 seconds (ref 11.4–15.2)

## 2019-12-31 LAB — MAGNESIUM
Magnesium: 2.1 mg/dL (ref 1.7–2.4)
Magnesium: 2 mg/dL (ref 1.7–2.4)

## 2019-12-31 LAB — D-DIMER, QUANTITATIVE: D-Dimer, Quant: 11.53 ug/mL-FEU — ABNORMAL HIGH (ref 0.00–0.50)

## 2019-12-31 LAB — PHOSPHORUS
Phosphorus: 1.6 mg/dL — ABNORMAL LOW (ref 2.5–4.6)
Phosphorus: 4 mg/dL (ref 2.5–4.6)

## 2019-12-31 LAB — PREPARE RBC (CROSSMATCH)

## 2019-12-31 LAB — BRAIN NATRIURETIC PEPTIDE: B Natriuretic Peptide: 578.5 pg/mL — ABNORMAL HIGH (ref 0.0–100.0)

## 2019-12-31 LAB — FERRITIN: Ferritin: 5930 ng/mL — ABNORMAL HIGH (ref 11–307)

## 2019-12-31 LAB — C-REACTIVE PROTEIN: CRP: 9 mg/dL — ABNORMAL HIGH (ref <1.0)

## 2019-12-31 LAB — POTASSIUM: Potassium: 2.6 mmol/L — CL (ref 3.5–5.1)

## 2019-12-31 MED ORDER — SODIUM CHLORIDE 0.9% IV SOLUTION: Freq: Once | INTRAVENOUS | Status: AC

## 2019-12-31 MED ORDER — SODIUM PHOSPHATES 45 MMOLE/15ML IV SOLN
20.0000 mmol | Freq: Once | INTRAVENOUS | Status: AC
  Administered 2019-12-31: 20 mmol via INTRAVENOUS
  Filled 2019-12-31: qty 6.67

## 2019-12-31 MED ORDER — CHLORHEXIDINE GLUCONATE CLOTH 2 % EX PADS
6.0000 | MEDICATED_PAD | Freq: Every day | CUTANEOUS | Status: DC
  Administered 2020-01-01 – 2020-01-20 (×16): 6 via TOPICAL

## 2019-12-31 MED ORDER — HYDRALAZINE HCL 20 MG/ML IJ SOLN
10.0000 mg | INTRAMUSCULAR | Status: DC | PRN
  Administered 2019-12-31 – 2020-01-04 (×5): 20 mg via INTRAVENOUS
  Administered 2020-01-05: 10 mg via INTRAVENOUS
  Administered 2020-01-05 – 2020-01-07 (×4): 20 mg via INTRAVENOUS
  Filled 2019-12-31 (×11): qty 1

## 2019-12-31 MED ORDER — HEPARIN SODIUM (PORCINE) 10000 UNIT/ML IJ SOLN
7500.0000 [IU] | Freq: Three times a day (TID) | INTRAMUSCULAR | Status: DC
  Administered 2019-12-31 – 2020-01-04 (×12): 7500 [IU] via SUBCUTANEOUS
  Filled 2019-12-31 (×14): qty 1

## 2019-12-31 MED ORDER — LABETALOL HCL 5 MG/ML IV SOLN
10.0000 mg | INTRAVENOUS | Status: DC | PRN
  Filled 2019-12-31: qty 4

## 2019-12-31 MED ORDER — CALCIUM GLUCONATE-NACL 2-0.675 GM/100ML-% IV SOLN
2.0000 g | Freq: Once | INTRAVENOUS | Status: AC
  Administered 2019-12-31: 2000 mg via INTRAVENOUS
  Filled 2019-12-31: qty 100

## 2019-12-31 MED ORDER — POTASSIUM CHLORIDE 10 MEQ/50ML IV SOLN
10.0000 meq | INTRAVENOUS | Status: AC
  Administered 2019-12-31 (×6): 10 meq via INTRAVENOUS
  Filled 2019-12-31 (×5): qty 50

## 2019-12-31 MED ORDER — ATROPINE SULFATE 1 MG/10ML IJ SOSY
PREFILLED_SYRINGE | INTRAMUSCULAR | Status: AC
  Filled 2019-12-31: qty 10

## 2019-12-31 MED ORDER — CHLORHEXIDINE GLUCONATE 0.12% ORAL RINSE (MEDLINE KIT)
15.0000 mL | Freq: Two times a day (BID) | OROMUCOSAL | Status: DC
  Administered 2019-12-31 – 2020-01-15 (×32): 15 mL via OROMUCOSAL

## 2019-12-31 MED ORDER — POTASSIUM CHLORIDE 10 MEQ/50ML IV SOLN
10.0000 meq | INTRAVENOUS | Status: AC
  Administered 2019-12-31 (×5): 10 meq via INTRAVENOUS
  Filled 2019-12-31 (×4): qty 50

## 2019-12-31 MED ORDER — ORAL CARE MOUTH RINSE
15.0000 mL | OROMUCOSAL | Status: DC
  Administered 2019-12-31 – 2020-01-15 (×153): 15 mL via OROMUCOSAL

## 2019-12-31 NOTE — Progress Notes (Signed)
LTM EEG hooked up and running - no initial skin breakdown - push button tested - neuro notified. Same leads  

## 2019-12-31 NOTE — Progress Notes (Signed)
Echocardiogram 2D Echocardiogram has been performed.  [place] Deputy Hermena Swint 12/31/2019, 1:58 PM

## 2019-12-31 NOTE — Progress Notes (Signed)
[facility] Progress Note Patient Name: Patricia [name] DOB: Oct 28, 1955 MRN: [id number]   Date of Service  12/31/2019  HPI/Events of Note  Pt needs a central line for medication access.  eICU Interventions  Bedside requested to go and place a central line in patient.        Kerry Kass Anusha Claus 12/31/2019, 4:04 AM

## 2019-12-31 NOTE — Progress Notes (Signed)
[facility] Progress Note Patient Name: Patricia [name] DOB: 1955-10-22 MRN: [id number]   Date of Service  12/31/2019  HPI/Events of Note  Pt with history of ESRD on dialysis who had a cardiac arrest at home after having some vomiting at dialysis earlier in the day, Pt intubated in the ED, K+ was 2.8. Pt now with elevated blood pressure in the context of stimulation.  eICU Interventions  Fentanyl 50 mcg iv bolus followed by increase in infusion rate to 200 mcg, New Patient Evaluation completed.        Kerry Kass Habib Kise 12/31/2019, 1:47 AM

## 2019-12-31 NOTE — Sepsis Progress Note (Signed)
Patient ultimately received 1500 cc of IV resuscitation and many other fluids in the ED. Lactic has cleared. Will s/o sepsis follow up and follow as ICU patient in [facility]

## 2019-12-31 NOTE — Consult Note (Signed)
Renal Service Consult Note [facility] Patricia [name] 12/31/2019 Patricia [name] Requesting Physician:  Dr Patricia [name], Mamie Nick.  Reason for Consult:  ESRD pt w/ cardiac arrest HPI: The patient is a 64 y.o. year-old w/ hx of HTN, DM2, CHF/ CM and ESRD on HD who had HD yest and towards the end of sessions developed N/V. Pt was dc'd home. Later in the day pt had witnessed collapse.  Family began CPR and pt rec'd IV epi x 2 when EMS arrived. Pt had ROSC at about 54mn. She was normotensive post-arrest but not responding. Admitted per CCM and started on hypothermia protocol.  Incidental COVID-19 was +. EKG in ED showed QTc of 672 ms. Hb 7.1 and some resp alkalosis. We are asked to see for ESRD.    Admit K+ was low at 2.8, Na 133, BUN 15 and creat 2.49.  Mg 2.1. Alb 1.8, AST 105 and ALT 45.  Hb 5.8 on admit, 8.2 today after 1u prbc's.  CXR shows patchy L basilar infiltrate, no edema  Pt is on vent and unresponsive.    ROS n/a   Past Medical History  Past Medical History:  Diagnosis Date  . Allergy   . Anemia   . Anxiety   . Blood transfusion without reported diagnosis   . Cardiomyopathy ([place] 11[place]   no ischemic eval due to CKD  . CH[place] (congestive heart failure) ([place]   . CKD (chronic kidney disease), stage IV ([facility]   . Depression   . Diabetes mellitus without complication ([place]    type 2  . Diverticulitis   . Dyspnea   . Hypertension   . Obesity    Past Surgical History  Past Surgical History:  Procedure Laterality Date  . ABDOMINAL HYSTERECTOMY    . BREAST SURGERY     reduction  . [place] FLU[place]O GUIDE CV LINE RIGHT  11/20/2019  . IR FLUORO GUIDE CV LINE RIGHT  11/23/2019  . IR [place] VASC ACCESS RIGHT  11/20/2019  . LAPAROSCOPY N/A 06/16/2019   Procedure: primary closure of umbilical hernia;  Surgeon: RRalene Ok MD;  [facility]  Service: General;  Laterality: N/A;  . LIPOMA EXCISION     back  Dr. HExcell Seltzer6-11-19  . LIPOMA  EXCISION N/A 03/22/2018   Procedure: EXCISION OF BACK LIPOMA;  Surgeon: HExcell Seltzer MD;  Location: [facility];  Service: General;  Laterality: N/A;  . REDUCTION MAMMAPLASTY Bilateral    Family History  Family History  Problem Relation Age of Onset  . Diabetes Brother   . Stomach cancer Brother   . Kidney disease Brother   . Heart Problems Maternal Grandmother        pacemaker  . Heart disease Maternal Grandfather   . Rectal cancer Neg Hx   . Esophageal cancer Neg Hx   . Liver cancer Neg Hx   . Colon cancer Neg Hx    Social History  reports that she has never smoked. She has never used smokeless tobacco. She reports that she does not drink alcohol or use drugs. Allergies  Allergies  Allergen Reactions  . Codone [Hydrocodone] Itching  . Dextromethorphan-Guaifenesin Nausea And Vomiting  . Norvasc [Amlodipine Besylate] Swelling    Lower extremity  . Hydralazine Swelling and Other (See Comments)    Leg swelling. Pt takes this as o/p. Patient can take PO now  . Sulfa Antibiotics Itching and Rash   Home medications Prior to Admission  medications   Medication Sig Start Date End Date Taking? Authorizing Provider  acetaminophen (TYLENOL) 500 MG tablet Take 1,000 mg by mouth in the morning and at bedtime.   Yes [provider]  albuterol (PROVENTIL HFA;VENTOLIN HFA) 108 (90 Base) MCG/ACT inhaler Inhale 1-2 puffs into the lungs every 6 (six) hours as needed for wheezing or shortness of breath. Patient taking differently: Inhale 1-2 puffs into the lungs every 4 (four) hours as needed for wheezing or shortness of breath.  10/11/18  Yes Marney Setting, NP  Amino Acids-Protein Hydrolys (FEEDING SUPPLEMENT, PRO-STAT SUGAR FREE 64,) LIQD Take 30 mLs by mouth 2 (two) times daily. 10/18/19  Yes [name], Ava, DO  amoxicillin-clavulanate (AUGMENTIN) 500-125 MG tablet Take 1 tablet by mouth daily. 12/18/19 01/02/20 Yes [provider]  aspirin EC 81 MG tablet Take 1 tablet (81 mg  total) by mouth daily. 01/04/17  Yes [name], Patricia T, MD  atorvastatin (LIPITOR) 20 MG tablet Take 1/2 (one-half) tablet by mouth once daily Patient taking differently: Take 10 mg by mouth daily.  06/12/19  Yes Patricia Pier, MD  calcitRIOL (ROCALTROL) 0.25 MCG capsule Take 0.25 mcg by mouth daily. 09/12/19  Yes [provider]  carvedilol (COREG) 25 MG tablet TAKE 1 TABLET BY MOUTH TWICE DAILY WITH A MEAL Patient taking differently: Take 12.5 mg by mouth 2 (two) times daily with a meal.  05/04/19  Yes [name], Mihai, MD  citalopram (CELEXA) 20 MG tablet Take 1 tablet (20 mg total) by mouth daily. 12/04/19  Yes Patricia Pier, MD  fluconazole (DIFLUCAN) 200 MG tablet Take 200 mg by mouth daily. 12/18/19  Yes [provider]  gabapentin (NEURONTIN) 100 MG capsule TAKE 1 CAPSULE (100 MG TOTAL) BY MOUTH AT BEDTIME. 12/05/18  Yes Patricia Pier, MD  hydrALAZINE (APRESOLINE) 25 MG tablet Take 1 tablet (25 mg total) by mouth 2 (two) times daily. 12/04/19  Yes Patricia Pier, MD  hydrOXYzine (ATARAX/VISTARIL) 25 MG tablet Take 1 tablet (25 mg total) by mouth daily as needed for anxiety. 12/04/19  Yes Patricia Pier, MD  insulin aspart (NOVOLOG FLEXPEN) 100 UNIT/ML FlexPen Give 2 units with meals if blood sugar > or =300 Patient taking differently: Inject 2 Units into the skin 3 (three) times daily with meals. Give 2 units with meals if blood sugar > or =300 12/04/19  Yes Patricia Pier, MD  Insulin Glargine (LANTUS SOLOSTAR) 100 UNIT/ML Solostar Pen Inject 6 Units into the skin at bedtime. 10/18/19  Yes [name], Ava, DO  multivitamin (RENA-VIT) TABS tablet Take 1 tablet by mouth at bedtime. 11/25/19  Yes [name], Patricia Latif, DO  promethazine (PHENERGAN) 25 MG tablet Take 25 mg by mouth daily as needed for nausea.  11/17/19  Yes [provider]  sevelamer (RENAGEL) 800 MG tablet Take 800 mg by mouth 3 (three) times daily with meals.    Yes [provider]   ACCU-CHEK FASTCLIX LANCETS MISC Uad tid Patient taking differently: 1 each by Other route 3 (three) times daily.  10/21/18   Patricia Pier, MD  acetaminophen (TYLENOL) 325 MG tablet Take 2 tablets (650 mg total) by mouth every 6 (six) hours as needed. Patient not taking: Reported on 12/31/2019 05/25/19   Varney Biles, MD  Blood Glucose Monitoring Suppl (ACCU-CHEK GUIDE) w/Device KIT 1 each by Does not apply route 3 (three) times daily. Use tid as directed 10/21/18   Patricia Pier, MD  glucose blood (ACCU-CHEK GUIDE) test strip Use  as instructed tid Patient taking differently: 1 each by Other route 3 (three) times daily.  10/21/18   Patricia Pier, MD  Insulin Pen Needle (TRUEPLUS PEN NEEDLES) 32G X 4 MM MISC Use as directed to inject insulin. Patient taking differently: 1 each by Other route daily.  03/25/18   Patricia Pier, MD  Insulin Pen Needle 31G X 5 MM MISC Use as directed Patient taking differently: 1 each by Other route daily.  03/28/18   Patricia Pier, MD  oxyCODONE (OXY IR/ROXICODONE) 5 MG immediate release tablet Take 1 tablet (5 mg total) by mouth every 4 (four) hours as needed for moderate pain or breakthrough pain (Hold & Call MD if SBP<90, HR<65, RR<10, O2<90, or altered mental status.). Patient not taking: Reported on 12/31/2019 10/18/19   [name], Ava, DO     Vitals:   12/31/19 1245 12/31/19 1300 12/31/19 1315 12/31/19 1330  BP: 122/66 121/65 121/65   Pulse: (!) 43 (!) 44 (!) 43 (!) 43  Resp: _0 Temp:  (!) 91 F (32.8 C)    TempSrc:  Bladder    SpO2: 100% 100% 100% 100%  Weight:      Height:       Exam Gen on vent, NG tube in ETT tube in place No rash, cyanosis or gangrene Sclera anicteric  No jvd or bruits Chest clear bilat ant and lat, no rales or rhonchi RRR no MRG Abd soft ntnd no mass or ascites +bs GU defer MS no joint effusions or deformity Ext no LE or UE edema, no wounds or ulcers Neuro is sedated on vent R IJ TDC in upper  chest    Home meds:  - glargine insulin 6u hs/ novolog 2u ac tid prn  - lipitor 10/ ecasa 81  - renagel ac tid/ rocaltrol 0.25 qd  - carvedilol 12.5 bid/ hydralazine 25 bid  - oxycodone 69m qid prn/ gabapentin 100 hs/ celexa 20 qd  - prn's/ vitamins/ supplements     K+ on admit 2.8, Na 133, BUN 15 and creat 2.49.  Mg 2.1. Alb 1.8, AST 105 and ALT 45   Hb 5.8 on admit, 8.2 today after 1u prbc's.     CXR shows patchy L basilar infiltrate, no edema      Outpt HD: [facility]    400/800  62kg  Hep 1900  R IJ TDC  - mircera 225 q2wks , last 3/18  - calc tiw    Assessment/ Plan: 1. Cardiac arrest - on cooling protocol, per CCM 2. Acute resp failure - on vent 3. COVID-19 + - incidental finding, getting remdesivir and decadron 4. ESRD - HD TTS. Had HD yesterday, labs ok and no vol excess. Next HD Tuesday. Will follow.  5. HTN/ Vol - up 2-3 kg today after NS boluses. CXR no edema. BP's stable on low side, no pressor support. Holding home bp meds. 6. Anemia ckd - Hb 8.0, last esa on 3/18 not due to 2 wks 7. IDDM       RKelly Splinter MD 12/31/2019, 1:58 PM  Recent Labs  Lab 12/31/19 0230 12/31/19 0230 12/31/19 0433 12/31/19 0439 12/31/19 0950 12/31/19 1158  WBC 8.4  --  6.8  --   --   --   HGB 6.5*  6.5*   < > 7.9*   < > 7.5* 7.5*   < > = values in this interval not displayed.   Recent Labs  Lab 12/13/2019 2007  12/12/2019 2015 12/17/2019 2243 12/31/19 0054 12/31/19 0433 12/31/19 0439 12/31/19 0950 12/31/19 1158  K 3.0*   < > 2.8*   < > 2.6*   < > 3.4* 3.6  BUN 14   < > 15  --  16  --   --   --   CREATININE 2.65*   < > 2.49*  --  2.48*  --   --   --   CALCIUM 7.0*  --  6.5*  --  6.8*  --   --   --   PHOS  --   --   --   --  1.6*  --   --   --    < > = values in this interval not displayed.

## 2019-12-31 NOTE — Progress Notes (Signed)
NAME:  Patricia [name], MRN:  [id number], DOB:  07/05/1956, LOS: 1 ADMISSION DATE:  12/19/2019, CONSULTATION DATE:  12/31/19 REFERRING MD:  Regenia Skeeter CHIEF COMPLAINT:  Cardiac Arrest   Brief History   64 y.o. F with PMH ESRD, anemia, HFrEF, Depression, Type 2 DM, HTN and obesity who presented in cardiac arrest.  Pt was in her usual state of health and went to dialysis today where she became nauseated and vomited, at home she had a syncopal episode and collapse. CPR initiated and ROSC after 44mns.    History of present illness   Patricia Eisenhoweris a 64y.o. F with PMH of  ESRD, anemia, HFrEF, Depression, Type 2 DM, HTN and obesity who presented in cardiac arrest. She lives with family and they state she has been in her usual state of health and went to dialysis today where she had an episode of nausea and vomiting and returned home.  There she became sweaty and collapsed, family started CPR immediately and called EMS who performed 15 minutes of CPR with ROSC, no mention of shockable rhythm.    In the ED, pt was not initially responsive and was intubated.  CT head was without acute findings, patient was hypertensive and febrile with a potassium of 2.8 and prolonged QTC.  No sign of STEMI on EKG.  Lactic acid was elevated and chest x-ray showed vascular congestion.  She was given 2 g of magnesium, broad-spectrum antibiotics and IV fluids.  Covid-19 resulted positive.  PCCM consulted for admission.  Family states that she has showed no symptoms of Covid, and they are unsure how she may have contracted this.  Past Medical History   has a past medical history of Allergy, Anemia, Anxiety, Blood transfusion without reported diagnosis, Cardiomyopathy ([place] (11/2016), CHF (congestive heart failure) ([place], CKD (chronic kidney disease), stage IV ([place], Depression, Diabetes mellitus without complication ([place], Diverticulitis, Dyspnea, Hypertension, and Obesity.   Significant Hospital Events   3/20 admit to  PCCM  Consults:  Nephrology  Procedures:  3/20 ET tube  Significant Diagnostic Tests:  3/20 CT head>> no acute findings  Micro Data:  3/20 SARS-Cov-2>> positive  Antimicrobials:  Cefepime 3/20- Flagyl 3/20 only Vancomycin 3/20-  Interim history/subjective:  Patient remained unresponsive over an hour after RSI medications.  Objective   Blood pressure (!) 115/58, pulse (!) 46, temperature (!) 90.7 F (32.6 C), temperature source Bladder, resp. rate 12, height _0  (1.626 m), weight 65.4 kg, SpO2 100 %. CVP:  [4 mmHg-8 mmHg] 4 mmHg  Vent Mode: PRVC FiO2 (%):  [40 %-100 %] 40 % Set Rate:  [16 bmp] 16 bmp Vt Set:  [430 mL] 430 mL PEEP:  [5 cmH20] 5 cmH20 Plateau Pressure:  [17 cmH20-19 cmH20] 18 cmH20   Intake/Output Summary (Last 24 hours) at 12/31/2019 0831 Last data filed at 12/31/2019 0800 Gross per 24 hour  Intake 1170.46 ml  Output 240 ml  Net 930.46 ml   Filed Weights   12/22/2019 2152 12/31/19 0500  Weight: 62.6 kg 65.4 kg     Resolved Hospital Problem list     Assessment & Plan:   Acute cardiopulmonary arrest -Possibly related to electrolyte disturbance prolonged QTC worsening heart failure, no signs of acute ischemia on EKG and troponin mildly elevated -ROSC obtained after 15 minutes of CPR -CT head without acute findings P: -Continue aspirin -Initiate targeted temperature management 33 degrees -Analgesia and paralysis with fentanyl, Versed and Nimbex -EEG -Echocardiogram and trend troponins -Maintain full vent support with  SAT/SBT as tolerated; vent changes made this a.m. based on most recent ABG -titrate Vent setting to maintain SpO2 greater than or equal to 90%. -HOB elevated 30 degrees. -Plateau pressures less than 30 cm H20.  -Follow chest x-ray, ABG prn.   -Bronchial hygiene and RT/bronchodilator protocol.    Covid-19 infection -Patient with low-grade fever on arrival, no URI symptoms per family unclear if contributory at all to today's  events P: -Start remdesivir and dexamethasone -Obtain inflammatory labs -Given she is mechanically ventilated give Tocilizumab if CRP>75 -She was covered with broad-spectrum antibiotics in the ED, continue Vanco and cefepime for now, though suspect fever and lactic acidosis are secondary to Covid-19 and chest compressions rather than sepsis physiology -Follow blood cultures -  HFrEF, hypertension -Echocardiogram 1 year ago with EF of 40 to 45% -She has recently been taken off all home antihypertensive medication except hydralazine due to borderline low blood pressure -Follows with Dr. Sallyanne Kuster P: -Patient appears fairly euvolemic, check BNP and echocardiogram -Monitor for volume overload received 1 L IV fluids in the ED   F/E/N: P: -appears euvolemic for now, strict I/Os given ESRD to avoid fluid overload -replete Phos today -NPO for now, will initiate TF after studies complete this a.m   Hypokalemia, prolonged QTC -Qtc 617 -Per cardiology note she has a history of prolonged QT interval, usually 480-510 ms -Last stress test in 2018 did not show evidence of ischemia she does not have a history of ventricular arrhythmia P: -Given 30 meq's in the ED and 2g Magnesium.  Recheck K and mag overnight and replete as needed -Repeat EKG in the a.m. -Avoid QT prolonging medications    ESRD -T/TH/sat schedule -Per family it sounds like she completed most of her dialysis 3/20 P: -have , no indication for emergent dialysis -Continue to monitor electrolytes and volume status    Best practice:  Diet: N.p.o. Pain/Anxiety/Delirium protocol (if indicated): Fentanyl Versed VAP protocol (if indicated): HOB 30 degrees, daily SBT as sedation will allow DVT prophylaxis: Heparin GI prophylaxis: Protonix Glucose control: SSI Mobility: Bedrest Code Status: Full code confirmed with family Family Communication: Spoke with patient's daughters and brother Disposition: Intensive care  Labs    CBC: Recent Labs  Lab 12/24/2019 2007 12/31/2019 2015 12/31/19 0054 12/31/19 0230 12/31/19 0433 12/31/19 0439 12/31/19 0620  WBC 12.3*  --   --  8.4 6.8  --   --   NEUTROABS 8.5*  --   --   --   --   --   --   HGB 7.1*   < > 7.1* 6.5*  6.5* 7.9* 7.8* 8.2*  HCT 23.6*   < > 21.0* 19.0*  20.2* 24.0* 23.0* 24.0*  MCV 97.5  --   --  91.4 89.6  --   --   PLT 213  --   --  185 167  --   --    < > = values in this interval not displayed.    Basic Metabolic Panel: Recent Labs  Lab 12/25/2019 2007 12/23/2019 2007 12/26/2019 2015 12/17/2019 2046 01/03/2020 2243 12/27/2019 2243 12/31/19 0054 12/31/19 0230 12/31/19 0433 12/31/19 0439 12/31/19 0620  NA 136   < > 135  134*   < > 133*   < > 135 134* 134* 136 136  K 3.0*   < > 2.9*  2.8*   < > 2.8*   < > 2.7* 2.4*  2.6* 2.6* 2.6* 2.8*  CL 97*  --  95*  --  97*  --   --   --  100  --   --   CO2 22  --   --   --  24  --   --   --  20*  --   --   GLUCOSE 205*  --  198*  --  240*  --   --   --  194*  --   --   BUN 14  --  13  --  15  --   --   --  16  --   --   CREATININE 2.65*  --  2.40*  --  2.49*  --   --   --  2.48*  --   --   CALCIUM 7.0*  --   --   --  6.5*  --   --   --  6.8*  --   --   MG 1.7  --   --   --   --   --   --   --  2.1  --   --   PHOS  --   --   --   --   --   --   --   --  1.6*  --   --    < > = values in this interval not displayed.   GFR: Estimated Creatinine Clearance: 20.6 mL/min (A) (by C-G formula based on SCr of 2.48 mg/dL (H)). Recent Labs  Lab 12/23/2019 2007 01/08/2020 2243 12/31/19 0230 12/31/19 0433  PROCALCITON  --   --  9.78  --   WBC 12.3*  --  8.4 6.8  LATICACIDVEN 6.2* 1.6  --   --     Liver Function Tests: Recent Labs  Lab 12/16/2019 2007  AST 105*  ALT 45*  ALKPHOS 70  BILITOT 0.5  PROT 5.4*  ALBUMIN 1.8*   No results for input(s): LIPASE, AMYLASE in the last 168 hours. No results for input(s): AMMONIA in the last 168 hours.  ABG    Component Value Date/Time   PHART 7.652 (HH) 12/31/2019  0620   PCO2ART 20.0 (L) 12/31/2019 0620   PO2ART 165.0 (H) 12/31/2019 0620   HCO3 22.8 12/31/2019 0620   TCO2 24 12/31/2019 0620   O2SAT 100.0 12/31/2019 0620     Coagulation Profile: Recent Labs  Lab 12/14/2019 2007 12/31/19 0433  INR 1.1 1.1    Cardiac Enzymes: No results for input(s): CKTOTAL, CKMB, CKMBINDEX, TROPONINI in the last 168 hours.  HbA1C: HbA1c, POC (prediabetic range)  Date/Time Value Ref Range Status  10/18/2018 10:39 AM 6.0 5.7 - 6.4 % Final   Hgb A1c MFr Bld  Date/Time Value Ref Range Status  11/20/2019 01:00 PM 7.4 (H) 4.8 - 5.6 % Final    Comment:    (NOTE)         Prediabetes: 5.7 - 6.4         Diabetes: >6.4         Glycemic control for adults with diabetes: <7.0   06/16/2019 09:20 PM 7.0 (H) 4.8 - 5.6 % Final    Comment:    (NOTE) Pre diabetes:          5.7%-6.4% Diabetes:              >6.4% Glycemic control for   <7.0% adults with diabetes     CBG: Recent Labs  Lab 12/31/19 0337 12/31/19 0436 12/31/19 0521 12/31/19 0618 12/31/19 0714  GLUCAP 193* 196* 193* 179* 182*    Review of Systems:   Unable  to obtain secondary to intubation  Past Medical History  She,  has a past medical history of Allergy, Anemia, Anxiety, Blood transfusion without reported diagnosis, Cardiomyopathy ([place]) (11/2016), CHF (congestive heart failure) ([place]), CKD (chronic kidney disease), stage IV ([place]), Depression, Diabetes mellitus without complication ([place]), Diverticulitis, Dyspnea, Hypertension, and Obesity.   Surgical History    Past Surgical History:  Procedure Laterality Date  . ABDOMINAL HYSTERECTOMY    . BREAST SURGERY     reduction  . [place]  . IR FLUORO GUIDE CV LINE RIGHT  11/20/2019  . IR FLUORO GUIDE CV LINE RIGHT  11/23/2019  . IR US GUIDE VASC ACCESS RIGHT  11/20/2019  . LAPAROSCOPY N/A 06/16/2019   Procedure: primary closure of umbilical hernia;  Surgeon: Ralene Ok, MD;  Location: [facility];  Service: General;   Laterality: N/A;  . LIPOMA EXCISION     back  Dr. Excell Seltzer 03-22-18  . LIPOMA EXCISION N/A 03/22/2018   Procedure: EXCISION OF BACK LIPOMA;  Surgeon: Excell Seltzer, MD;  Location: [facility];  Service: General;  Laterality: N/A;  . REDUCTION MAMMAPLASTY Bilateral      Social History   reports that she has never smoked. She has never used smokeless tobacco. She reports that she does not drink alcohol or use drugs.   Family History   Her family history includes Diabetes in her brother; Heart Problems in her maternal grandmother; Heart disease in her maternal grandfather; Kidney disease in her brother; Stomach cancer in her brother. There is no history of Rectal cancer, Esophageal cancer, Liver cancer, or Colon cancer.   Allergies Allergies  Allergen Reactions  . Codone [Hydrocodone] Itching  . Norvasc [Amlodipine Besylate] Swelling    Lower extremity  . Hydralazine Swelling and Other (See Comments)    Leg swelling. Pt takes this as o/p  . Sulfa Antibiotics Itching and Rash     Home Medications  Prior to Admission medications   Medication Sig Start Date End Date Taking? Authorizing Provider  ACCU-CHEK FASTCLIX LANCETS MISC Uad tid Patient taking differently: 1 each by Other route 3 (three) times daily.  10/21/18   Ladell Pier, MD  acetaminophen (TYLENOL) 325 MG tablet Take 2 tablets (650 mg total) by mouth every 6 (six) hours as needed. Patient taking differently: Take 650 mg by mouth every 6 (six) hours as needed for headache (pain).  05/25/19   Varney Biles, MD  albuterol (PROVENTIL HFA;VENTOLIN HFA) 108 (90 Base) MCG/ACT inhaler Inhale 1-2 puffs into the lungs every 6 (six) hours as needed for wheezing or shortness of breath. Patient not taking: Reported on 11/19/2019 10/11/18   Marney Setting, NP  Amino Acids-Protein Hydrolys (FEEDING SUPPLEMENT, PRO-STAT SUGAR FREE 64,) LIQD Take 30 mLs by mouth 2 (two) times daily. 10/18/19   Swayze, Ava, DO  aspirin EC 81 MG tablet Take  1 tablet (81 mg total) by mouth daily. 01/04/17   Maren Reamer, MD  atorvastatin (LIPITOR) 20 MG tablet Take 1/2 (one-half) tablet by mouth once daily Patient taking differently: Take 10 mg by mouth daily.  06/12/19   Ladell Pier, MD  Blood Glucose Monitoring Suppl (ACCU-CHEK GUIDE) w/Device KIT 1 each by Does not apply route 3 (three) times daily. Use tid as directed 10/21/18   Ladell Pier, MD  calcitRIOL (ROCALTROL) 0.25 MCG capsule Take 0.25 mcg by mouth daily. 09/12/19   [provider]  carvedilol (COREG) 25 MG tablet TAKE 1 TABLET BY MOUTH TWICE DAILY WITH  A MEAL Patient taking differently: Take 25 mg by mouth 2 (two) times daily with a meal.  05/04/19   Croitoru, Mihai, MD  citalopram (CELEXA) 20 MG tablet Take 1 tablet (20 mg total) by mouth daily. 12/04/19   Ladell Pier, MD  gabapentin (NEURONTIN) 100 MG capsule TAKE 1 CAPSULE (100 MG TOTAL) BY MOUTH AT BEDTIME. 12/05/18   Ladell Pier, MD  gentamicin cream (GARAMYCIN) 0.1 % Apply 1 application topically See admin instructions. Apply topically to dialysis site after showering - every other day 06/10/19   [provider]  glucose blood (ACCU-CHEK GUIDE) test strip Use as instructed tid Patient taking differently: 1 each by Other route 3 (three) times daily.  10/21/18   Ladell Pier, MD  hydrALAZINE (APRESOLINE) 25 MG tablet Take 1 tablet (25 mg total) by mouth 2 (two) times daily. 12/04/19   Ladell Pier, MD  hydrOXYzine (ATARAX/VISTARIL) 25 MG tablet Take 1 tablet (25 mg total) by mouth daily as needed for anxiety. 12/04/19   Ladell Pier, MD  insulin aspart (NOVOLOG FLEXPEN) 100 UNIT/ML FlexPen Give 2 units with meals if blood sugar > or =300 12/04/19   Ladell Pier, MD  Insulin Glargine (LANTUS SOLOSTAR) 100 UNIT/ML Solostar Pen Inject 6 Units into the skin at bedtime. 10/18/19   Swayze, Ava, DO  Insulin Pen Needle (TRUEPLUS PEN NEEDLES) 32G X 4 MM MISC Use as directed to inject  insulin. Patient taking differently: 1 each by Other route daily.  03/25/18   Ladell Pier, MD  Insulin Pen Needle 31G X 5 MM MISC Use as directed Patient taking differently: 1 each by Other route daily.  03/28/18   Ladell Pier, MD  multivitamin (RENA-VIT) TABS tablet Take 1 tablet by mouth at bedtime. 11/25/19   Raiford Noble Latif, DO  oxyCODONE (OXY IR/ROXICODONE) 5 MG immediate release tablet Take 1 tablet (5 mg total) by mouth every 4 (four) hours as needed for moderate pain or breakthrough pain (Hold & Call MD if SBP<90, HR<65, RR<10, O2<90, or altered mental status.). 10/18/19   Swayze, Ava, DO  Polyethyl Glycol-Propyl Glycol (SYSTANE) 0.4-0.3 % SOLN Place 1 drop into both eyes 3 (three) times daily as needed (dry eyes).     [provider]  promethazine (PHENERGAN) 25 MG tablet Take 25 mg by mouth daily as needed for nausea.  11/17/19   [provider]  sevelamer (RENAGEL) 800 MG tablet Take 1,600-3,200 mg by mouth See admin instructions. Take 2-4 tablets (1600-3200 mg) by mouth up to three times daily with meals - 2-3 tablets with small meal, 4 tablets with large meal    [provider]     Critical care time: 65 minutes       CRITICAL CARE Performed by: Bonna Gains   Total critical care time: 65 minutes  Critical care time was exclusive of separately billable procedures and treating other patients.  Critical care was necessary to treat or prevent imminent or life-threatening deterioration.  Critical care was time spent personally by me on the following activities: development of treatment plan with patient and/or surrogate as well as nursing, discussions with consultants, evaluation of patient's response to treatment, examination of patient, obtaining history from patient or surrogate, ordering and performing treatments and interventions, ordering and review of laboratory studies, ordering and review of radiographic studies, pulse oximetry and  re-evaluation of patient's condition.   Bonna Gains, MD 12/31/19 8:50 AM

## 2019-12-31 NOTE — Progress Notes (Signed)
EEG complete - results pending 

## 2019-12-31 NOTE — Procedures (Signed)
Central Venous Catheter Insertion Procedure Note Patricia [name] [id number] 24-Jun-1956  Procedure: Insertion of Central Venous Catheter Indications: Assessment of intravascular volume, Drug and/or fluid administration and Frequent blood sampling  Procedure Details Consent: Risks of procedure as well as the alternatives and risks of each were explained to the (patient/caregiver).  Consent for procedure obtained. Time Out: Verified patient identification, verified procedure, site/side was marked, verified correct patient position, special equipment/implants available, medications/allergies/relevent history reviewed, required imaging and test results available.  Performed  Maximum sterile technique was used including antiseptics, cap, gloves, gown, hand hygiene, mask and sheet. Skin prep: Chlorhexidine; local anesthetic administered A antimicrobial bonded/coated triple lumen catheter was placed in the right internal jugular vein using the Seldinger technique.  Evaluation Blood flow good Complications: No apparent complications Patient did tolerate procedure well. Chest X-ray ordered to verify placement.  CXR: pending.  Otilio Carpen Patricia [name] 12/31/2019, 5:15 AM

## 2019-12-31 NOTE — Procedures (Addendum)
ELECTROENCEPHALOGRAM REPORT   Patient: Patricia [name]       Room #: 8V81M EEG No. ID: [id number] Age: 64 y.o.        Sex: female Requesting Physician: Maryjean Ka Report Date:  12/31/2019        Interpreting Physician: Alexis Goodell  History: Patricia [name] is an 64 y.o. female s/p arrest  Medications:  Nimbex, Fentanyl, Versed, Levophed, Vancomycin, Cefepime, ASA, Dexamethasone, Remdesivir  Conditions of Recording:  This is a 21 channel routine scalp EEG performed with bipolar and monopolar montages arranged in accordance to the international 10/20 system of electrode placement. One channel was dedicated to EKG recording.  The patient is in the intubated, sedated and paralyzed state.  Description:  The background activity is markedly attenuated due to low amplitude at sensitivity of 7uV/mm.  When sensitivity is increased to 3uV/mm a very low voltage polymorphic delta activity can be appreciated that is continuous and diffusely distributed.   No epileptiform activity is noted.   The is no activation of the background noted with stimulation. Hyperventilation and intermittent photic stimulation were not performed.   IMPRESSION: This is an abnormal EEG secondary to general background that is slow and markedly attenuated.  This finding may be seen with a diffuse disturbance that is etiologically nonspecific, but may include a metabolic encephalopathy or medication effect, among other possibilities.  No epileptiform activity is noted.     Alexis Goodell, MD Neurology [phone] 12/31/2019, 3:43 PM

## 2019-12-31 NOTE — Progress Notes (Signed)
[facility] Progress Note Patient Name: Patricia [name] DOB: 07/19/1956 MRN: [id number]   Date of Service  12/31/2019  HPI/Events of Note  K+ 2.7, Ca++ 0.99  eICU Interventions  KCL 10 meq iv Q 1 hour x 5 doses, Calcium gluconate 2 gm iv x 1        Shin Lamour U Dolorez Jeffrey 12/31/2019, 3:24 AM

## 2019-12-31 NOTE — Progress Notes (Signed)
[facility] Progress Note Patient Name: Patricia [name] DOB: 08-24-1956 MRN: [id number]   Date of Service  12/31/2019  HPI/Events of Note  PT lists hydralazine as causing leg swelling bt she's on oral hydralazine making true allergy unlikely. She;s too bradycardic for PRN Labetalol.  eICU Interventions  Hydralazine 10-20 mg iv Q 4 hours PRN SBP > 170 mmHg.        Kerry Kass Leeza Heiner 12/31/2019, 4:52 AM

## 2019-12-31 NOTE — Progress Notes (Signed)
ANTICOAGULATION CONSULT NOTE - Initial Consult  Pharmacy Consult for [facility] Indication: VTE prophylaxis  Allergies  Allergen Reactions  . Codone [Hydrocodone] Itching  . Norvasc [Amlodipine Besylate] Swelling    Lower extremity  . Hydralazine Swelling and Other (See Comments)    Leg swelling. Pt takes this as o/p  . Sulfa Antibiotics Itching and Rash    Patient Measurements: Height: 5\' 4"  (162.6 cm) Weight: 138 lb (62.6 kg) IBW/kg (Calculated) : 54.7 Vital Signs: Temp: 91 F (32.8 C) (03/21 0340) Temp Source: Bladder (03/21 0340) BP: 151/67 (03/21 0345) Pulse Rate: 46 (03/21 0345)  Labs: Recent Labs    12/11/2019 2007 12/28/2019 2007 01/05/2020 2015 12/12/2019 2015 12/21/2019 2046 12/13/2019 2046 01/06/2020 2243 12/31/19 0002 12/31/19 0054 12/31/19 0230  HGB 7.1*   < > 7.1*  7.5*   < > 5.8*   < >  --   --  7.1* 6.5*  HCT 23.6*   < > 21.0*  22.0*   < > 17.0*  --   --   --  21.0* 20.2*  PLT 213  --   --   --   --   --   --   --   --  185  APTT  --   --   --   --   --   --   --  48*  --   --   LABPROT 14.0  --   --   --   --   --   --   --   --   --   INR 1.1  --   --   --   --   --   --   --   --   --   CREATININE 2.65*  --  2.40*  --   --   --  2.49*  --   --   --   TROPONINIHS 38*  --   --   --   --   --  79*  --   --   --    < > = values in this interval not displayed.    Estimated Creatinine Clearance: 20.5 mL/min (A) (by C-G formula based on SCr of 2.49 mg/dL (H)).   Medical History: Past Medical History:  Diagnosis Date  . Allergy   . Anemia   . Anxiety   . Blood transfusion without reported diagnosis   . Cardiomyopathy ([place]) 11/2016   no ischemic eval due to CKD  . CHF (congestive heart failure) ([place])   . CKD (chronic kidney disease), stage IV ([place])   . Depression   . Diabetes mellitus without complication ([place])    type 2  . Diverticulitis   . Dyspnea   . Hypertension   . Obesity      Assessment/Plan:  64yo female suffered cardiac arrest after HD,  found to be Covid-positive, now undergoing TTM to 33 degrees, to start DVT Px; given Covid Dx and D-dimer >5 with CrCl <30, will start heparin 7500 units SQ Q8H.  Patricia [name], PharmD, BCPS  12/31/2019,3:48 AM

## 2019-12-31 NOTE — Procedures (Signed)
Arterial Catheter Insertion Procedure Note VASHTI BOLANOS [id number] 1956-06-17  Procedure: Insertion of Arterial Catheter  Indications: Blood pressure monitoring and Frequent blood sampling  Procedure Details Consent: Unable to obtain consent because of emergent medical necessity. Time Out: Verified patient identification, verified procedure, site/side was marked, verified correct patient position, special equipment/implants available, medications/allergies/relevent history reviewed, required imaging and test results available.  Performed  Maximum sterile technique was used including antiseptics, cap, gloves, gown, hand hygiene, mask and sheet. Skin prep: Chlorhexidine; local anesthetic administered 20 gauge catheter was inserted into right radial artery using the Seldinger technique. ULTRASOUND GUIDANCE USED: NO Evaluation Blood flow good; BP tracing good. Complications: No apparent complications.   Myrtis Ser 12/31/2019

## 2020-01-01 DIAGNOSIS — G931 Anoxic brain damage, not elsewhere classified: Secondary | ICD-10-CM

## 2020-01-01 DIAGNOSIS — Z9911 Dependence on respirator [ventilator] status: Secondary | ICD-10-CM

## 2020-01-01 DIAGNOSIS — N186 End stage renal disease: Secondary | ICD-10-CM

## 2020-01-01 DIAGNOSIS — R9431 Abnormal electrocardiogram [ECG] [EKG]: Secondary | ICD-10-CM

## 2020-01-01 LAB — BASIC METABOLIC PANEL
Anion gap: 11 (ref 5–15)
Anion gap: 9 (ref 5–15)
Anion gap: 9 (ref 5–15)
Anion gap: 9 (ref 5–15)
Anion gap: 12 (ref 5–15)
Anion gap: 10 (ref 5–15)
Anion gap: 10 (ref 5–15)
BUN: 26 mg/dL — ABNORMAL HIGH (ref 8–23)
BUN: 26 mg/dL — ABNORMAL HIGH (ref 8–23)
BUN: 29 mg/dL — ABNORMAL HIGH (ref 8–23)
BUN: 32 mg/dL — ABNORMAL HIGH (ref 8–23)
BUN: 35 mg/dL — ABNORMAL HIGH (ref 8–23)
BUN: 36 mg/dL — ABNORMAL HIGH (ref 8–23)
BUN: 38 mg/dL — ABNORMAL HIGH (ref 8–23)
CO2: 20 mmol/L — ABNORMAL LOW (ref 22–32)
CO2: 20 mmol/L — ABNORMAL LOW (ref 22–32)
CO2: 21 mmol/L — ABNORMAL LOW (ref 22–32)
CO2: 22 mmol/L (ref 22–32)
CO2: 21 mmol/L — ABNORMAL LOW (ref 22–32)
CO2: 21 mmol/L — ABNORMAL LOW (ref 22–32)
CO2: 21 mmol/L — ABNORMAL LOW (ref 22–32)
Calcium: 7.4 mg/dL — ABNORMAL LOW (ref 8.9–10.3)
Calcium: 7.4 mg/dL — ABNORMAL LOW (ref 8.9–10.3)
Calcium: 7.5 mg/dL — ABNORMAL LOW (ref 8.9–10.3)
Calcium: 7.4 mg/dL — ABNORMAL LOW (ref 8.9–10.3)
Calcium: 7.7 mg/dL — ABNORMAL LOW (ref 8.9–10.3)
Calcium: 7.5 mg/dL — ABNORMAL LOW (ref 8.9–10.3)
Calcium: 7.5 mg/dL — ABNORMAL LOW (ref 8.9–10.3)
Chloride: 105 mmol/L (ref 98–111)
Chloride: 106 mmol/L (ref 98–111)
Chloride: 106 mmol/L (ref 98–111)
Chloride: 105 mmol/L (ref 98–111)
Chloride: 104 mmol/L (ref 98–111)
Chloride: 104 mmol/L (ref 98–111)
Chloride: 106 mmol/L (ref 98–111)
Creatinine, Ser: 3.12 mg/dL — ABNORMAL HIGH (ref 0.44–1.00)
Creatinine, Ser: 3.08 mg/dL — ABNORMAL HIGH (ref 0.44–1.00)
Creatinine, Ser: 3.2 mg/dL — ABNORMAL HIGH (ref 0.44–1.00)
Creatinine, Ser: 3.42 mg/dL — ABNORMAL HIGH (ref 0.44–1.00)
Creatinine, Ser: 3.62 mg/dL — ABNORMAL HIGH (ref 0.44–1.00)
Creatinine, Ser: 3.71 mg/dL — ABNORMAL HIGH (ref 0.44–1.00)
Creatinine, Ser: 3.87 mg/dL — ABNORMAL HIGH (ref 0.44–1.00)
GFR calc Af Amer: 18 mL/min — ABNORMAL LOW (ref >60)
GFR calc Af Amer: 18 mL/min — ABNORMAL LOW (ref >60)
GFR calc Af Amer: 17 mL/min — ABNORMAL LOW (ref >60)
GFR calc Af Amer: 16 mL/min — ABNORMAL LOW (ref >60)
GFR calc Af Amer: 15 mL/min — ABNORMAL LOW (ref >60)
GFR calc Af Amer: 14 mL/min — ABNORMAL LOW (ref >60)
GFR calc Af Amer: 14 mL/min — ABNORMAL LOW (ref >60)
GFR calc non Af Amer: 15 mL/min — ABNORMAL LOW (ref >60)
GFR calc non Af Amer: 16 mL/min — ABNORMAL LOW (ref >60)
GFR calc non Af Amer: 15 mL/min — ABNORMAL LOW (ref >60)
GFR calc non Af Amer: 14 mL/min — ABNORMAL LOW (ref >60)
GFR calc non Af Amer: 13 mL/min — ABNORMAL LOW (ref >60)
GFR calc non Af Amer: 12 mL/min — ABNORMAL LOW (ref >60)
GFR calc non Af Amer: 12 mL/min — ABNORMAL LOW (ref >60)
Glucose, Bld: 165 mg/dL — ABNORMAL HIGH (ref 70–99)
Glucose, Bld: 165 mg/dL — ABNORMAL HIGH (ref 70–99)
Glucose, Bld: 177 mg/dL — ABNORMAL HIGH (ref 70–99)
Glucose, Bld: 210 mg/dL — ABNORMAL HIGH (ref 70–99)
Glucose, Bld: 222 mg/dL — ABNORMAL HIGH (ref 70–99)
Glucose, Bld: 260 mg/dL — ABNORMAL HIGH (ref 70–99)
Glucose, Bld: 279 mg/dL — ABNORMAL HIGH (ref 70–99)
Potassium: 5.1 mmol/L (ref 3.5–5.1)
Potassium: 5 mmol/L (ref 3.5–5.1)
Potassium: 5 mmol/L (ref 3.5–5.1)
Potassium: 5.1 mmol/L (ref 3.5–5.1)
Potassium: 5.3 mmol/L — ABNORMAL HIGH (ref 3.5–5.1)
Potassium: 5.1 mmol/L (ref 3.5–5.1)
Potassium: 5.3 mmol/L — ABNORMAL HIGH (ref 3.5–5.1)
Sodium: 136 mmol/L (ref 135–145)
Sodium: 135 mmol/L (ref 135–145)
Sodium: 136 mmol/L (ref 135–145)
Sodium: 136 mmol/L (ref 135–145)
Sodium: 137 mmol/L (ref 135–145)
Sodium: 135 mmol/L (ref 135–145)
Sodium: 137 mmol/L (ref 135–145)

## 2020-01-01 LAB — POCT I-STAT 7, (LYTES, BLD GAS, ICA,H+H)
Acid-base deficit: 2 mmol/L (ref 0.0–2.0)
Acid-base deficit: 4 mmol/L — ABNORMAL HIGH (ref 0.0–2.0)
Bicarbonate: 21.8 mmol/L (ref 20.0–28.0)
Bicarbonate: 21.7 mmol/L (ref 20.0–28.0)
Calcium, Ion: 1.12 mmol/L — ABNORMAL LOW (ref 1.15–1.40)
Calcium, Ion: 1.13 mmol/L — ABNORMAL LOW (ref 1.15–1.40)
HCT: 25 % — ABNORMAL LOW (ref 36.0–46.0)
HCT: 25 % — ABNORMAL LOW (ref 36.0–46.0)
Hemoglobin: 8.5 g/dL — ABNORMAL LOW (ref 12.0–15.0)
Hemoglobin: 8.5 g/dL — ABNORMAL LOW (ref 12.0–15.0)
O2 Saturation: 100 %
O2 Saturation: 97 %
Patient temperature: 33
Potassium: 4.9 mmol/L (ref 3.5–5.1)
Potassium: 4.8 mmol/L (ref 3.5–5.1)
Sodium: 135 mmol/L (ref 135–145)
Sodium: 135 mmol/L (ref 135–145)
TCO2: 23 mmol/L (ref 22–32)
TCO2: 23 mmol/L (ref 22–32)
pCO2 arterial: 28.2 mmHg — ABNORMAL LOW (ref 32.0–48.0)
pCO2 arterial: 40.4 mmHg (ref 32.0–48.0)
pH, Arterial: 7.481 — ABNORMAL HIGH (ref 7.350–7.450)
pH, Arterial: 7.339 — ABNORMAL LOW (ref 7.350–7.450)
pO2, Arterial: 180 mmHg — ABNORMAL HIGH (ref 83.0–108.0)
pO2, Arterial: 99 mmHg (ref 83.0–108.0)

## 2020-01-01 LAB — CBC
HCT: 27.5 % — ABNORMAL LOW (ref 36.0–46.0)
Hemoglobin: 9 g/dL — ABNORMAL LOW (ref 12.0–15.0)
MCH: 29.6 pg (ref 26.0–34.0)
MCHC: 32.7 g/dL (ref 30.0–36.0)
MCV: 90.5 fL (ref 80.0–100.0)
Platelets: 175 10*3/uL (ref 150–400)
RBC: 3.04 MIL/uL — ABNORMAL LOW (ref 3.87–5.11)
RDW: 15.8 % — ABNORMAL HIGH (ref 11.5–15.5)
WBC: 7.4 10*3/uL (ref 4.0–10.5)
nRBC: 0 % (ref 0.0–0.2)

## 2020-01-01 LAB — MAGNESIUM
Magnesium: 1.9 mg/dL (ref 1.7–2.4)
Magnesium: 2 mg/dL (ref 1.7–2.4)
Magnesium: 2 mg/dL (ref 1.7–2.4)

## 2020-01-01 LAB — COMPREHENSIVE METABOLIC PANEL
ALT: 33 U/L (ref 0–44)
AST: 50 U/L — ABNORMAL HIGH (ref 15–41)
Albumin: 1.3 g/dL — ABNORMAL LOW (ref 3.5–5.0)
Alkaline Phosphatase: 51 U/L (ref 38–126)
Anion gap: 12 (ref 5–15)
BUN: 23 mg/dL (ref 8–23)
CO2: 19 mmol/L — ABNORMAL LOW (ref 22–32)
Calcium: 7.5 mg/dL — ABNORMAL LOW (ref 8.9–10.3)
Chloride: 105 mmol/L (ref 98–111)
Creatinine, Ser: 2.87 mg/dL — ABNORMAL HIGH (ref 0.44–1.00)
GFR calc Af Amer: 20 mL/min — ABNORMAL LOW (ref >60)
GFR calc non Af Amer: 17 mL/min — ABNORMAL LOW (ref >60)
Glucose, Bld: 140 mg/dL — ABNORMAL HIGH (ref 70–99)
Potassium: 4.5 mmol/L (ref 3.5–5.1)
Sodium: 136 mmol/L (ref 135–145)
Total Bilirubin: 0.5 mg/dL (ref 0.3–1.2)
Total Protein: 4.5 g/dL — ABNORMAL LOW (ref 6.5–8.1)

## 2020-01-01 LAB — TYPE AND SCREEN
ABO/RH(D): B POS
Antibody Screen: NEGATIVE
Unit division: 0

## 2020-01-01 LAB — PHOSPHORUS
Phosphorus: 4.9 mg/dL — ABNORMAL HIGH (ref 2.5–4.6)
Phosphorus: 6.9 mg/dL — ABNORMAL HIGH (ref 2.5–4.6)
Phosphorus: 7.2 mg/dL — ABNORMAL HIGH (ref 2.5–4.6)

## 2020-01-01 LAB — GLUCOSE, CAPILLARY
Glucose-Capillary: 117 mg/dL — ABNORMAL HIGH (ref 70–99)
Glucose-Capillary: 143 mg/dL — ABNORMAL HIGH (ref 70–99)
Glucose-Capillary: 144 mg/dL — ABNORMAL HIGH (ref 70–99)
Glucose-Capillary: 159 mg/dL — ABNORMAL HIGH (ref 70–99)
Glucose-Capillary: 187 mg/dL — ABNORMAL HIGH (ref 70–99)
Glucose-Capillary: 229 mg/dL — ABNORMAL HIGH (ref 70–99)
Glucose-Capillary: 262 mg/dL — ABNORMAL HIGH (ref 70–99)

## 2020-01-01 LAB — PROTIME-INR
INR: 1.1 (ref 0.8–1.2)
Prothrombin Time: 14.4 seconds (ref 11.4–15.2)

## 2020-01-01 LAB — BPAM RBC
Blood Product Expiration Date: 202104142359
ISSUE DATE / TIME: 202103210311
Unit Type and Rh: 7300

## 2020-01-01 LAB — PROCALCITONIN: Procalcitonin: 24.23 ng/mL

## 2020-01-01 MED ORDER — NOREPINEPHRINE 4 MG/250ML-% IV SOLN
0.0000 ug/min | INTRAVENOUS | Status: DC
  Administered 2020-01-02: 4 ug/min via INTRAVENOUS
  Administered 2020-01-04 – 2020-01-07 (×2): 5 ug/min via INTRAVENOUS
  Administered 2020-01-07: 50 ug/min via INTRAVENOUS
  Filled 2020-01-01 (×5): qty 250

## 2020-01-01 MED ORDER — VITAL HIGH PROTEIN PO LIQD
1000.0000 mL | ORAL | Status: DC
  Administered 2020-01-01: 1000 mL

## 2020-01-01 MED ORDER — RENA-VITE PO TABS
1.0000 | ORAL_TABLET | Freq: Every day | ORAL | Status: DC
  Administered 2020-01-01 – 2020-01-02 (×2): 1 via ORAL
  Filled 2020-01-01 (×2): qty 1

## 2020-01-01 MED ORDER — PROPOFOL 1000 MG/100ML IV EMUL
20.0000 ug/kg/min | INTRAVENOUS | Status: DC
  Administered 2020-01-01: 09:00:00 25 ug/kg/min via INTRAVENOUS
  Administered 2020-01-01: 20:00:00 18 ug/kg/min via INTRAVENOUS
  Filled 2020-01-01 (×2): qty 100

## 2020-01-01 MED ORDER — VITAL 1.5 CAL PO LIQD
1000.0000 mL | ORAL | Status: DC
  Administered 2020-01-01 – 2020-01-13 (×8): 1000 mL
  Filled 2020-01-01 (×14): qty 1000

## 2020-01-01 MED ORDER — CHLORHEXIDINE GLUCONATE CLOTH 2 % EX PADS
6.0000 | MEDICATED_PAD | Freq: Every day | CUTANEOUS | Status: DC
  Administered 2020-01-03 – 2020-01-11 (×8): 6 via TOPICAL

## 2020-01-01 MED ORDER — INSULIN ASPART 100 UNIT/ML ~~LOC~~ SOLN
1.0000 [IU] | SUBCUTANEOUS | Status: DC
  Administered 2020-01-01: 20:00:00 3 [IU] via SUBCUTANEOUS
  Administered 2020-01-01 (×2): 2 [IU] via SUBCUTANEOUS
  Administered 2020-01-01: 3 [IU] via SUBCUTANEOUS

## 2020-01-01 MED ORDER — PRO-STAT SUGAR FREE PO LIQD
30.0000 mL | Freq: Two times a day (BID) | ORAL | Status: DC
  Administered 2020-01-01 – 2020-01-15 (×28): 30 mL
  Filled 2020-01-01 (×28): qty 30

## 2020-01-01 NOTE — Progress Notes (Signed)
Patient transported from [place] to 1P21 with no complications.

## 2020-01-01 NOTE — Progress Notes (Signed)
NAME:  CEDRIC DENISON, MRN:  [id number], DOB:  [birth year], LOS: 2 ADMISSION DATE:  12/17/2019, CONSULTATION DATE:  01/01/20 REFERRING MD:  Regenia Skeeter CHIEF COMPLAINT:  Cardiac Arrest   Brief History   64 y.o. F with PMH ESRD, anemia, HFrEF, Depression, Type 2 DM, HTN and obesity who presented in cardiac arrest.  Pt was in her usual state of health and went to dialysis today where she became nauseated and vomited, at home she had a syncopal episode and collapse. CPR initiated and ROSC after 55mins.    History of present illness   Valma Rotenberg is a 64 y.o. F with PMH of  ESRD, anemia, HFrEF, Depression, Type 2 DM, HTN and obesity who presented in cardiac arrest. She lives with family and they state she has been in her usual state of health and went to dialysis today where she had an episode of nausea and vomiting and returned home.  There she became sweaty and collapsed, family started CPR immediately and called EMS who performed 15 minutes of CPR with ROSC, no mention of shockable rhythm.    In the ED, pt was not initially responsive and was intubated.  CT head was without acute findings, patient was hypertensive and febrile with a potassium of 2.8 and prolonged QTC.  No sign of STEMI on EKG.  Lactic acid was elevated and chest x-ray showed vascular congestion.  She was given 2 g of magnesium, broad-spectrum antibiotics and IV fluids.  Covid-19 resulted positive.  PCCM consulted for admission.  Family states that she has showed no symptoms of Covid, and they are unsure how she may have contracted this.  Past Medical History   has a past medical history of Allergy, Anemia, Anxiety, Blood transfusion without reported diagnosis, Cardiomyopathy ([place]) (11/2016), CHF (congestive heart failure) ([place]), CKD (chronic kidney disease), stage IV ([place]), Depression, Diabetes mellitus without complication ([place]), Diverticulitis, Dyspnea, Hypertension, and Obesity.   Significant Hospital Events   3/20 admit to  PCCM  Consults:  Nephrology  Procedures:  3/20 ET tube  Significant Diagnostic Tests:  3/20 CT head>> no acute findings 3/21 EEG>> generalized background slowing, no epileptiform activity 3/21 echocardiogram-LVEF 30 to 35%, no regional wall motion abnormalities, moderate LVH.  Grade 1 diastolic dysfunction.  Mildly dilated LA, normal RV.  Micro Data:  3/20 SARS-Cov-2>> positive  Antimicrobials:  Cefepime 3/20- Flagyl 3/20 only Vancomycin 3/20-  Interim history/subjective:  Started rewarming around 3:30 this morning from 33 degrees.  Showed reached normothermia around 6:30 this evening.  Objective   Blood pressure (!) 109/56, pulse (!) 47, temperature (!) 93 F (33.9 C), temperature source Bladder, resp. rate (!) 6, height 5\' 4"  (1.626 m), weight 66.8 kg, SpO2 100 %. CVP:  [0 mmHg-9 mmHg] 1 mmHg  Vent Mode: PRVC FiO2 (%):  [40 %] 40 % Set Rate:  [10 bmp-12 bmp] 10 bmp Vt Set:  [430 mL] 430 mL PEEP:  [5 cmH20] 5 cmH20 Plateau Pressure:  [17 cmH20-18 cmH20] 17 cmH20   Intake/Output Summary (Last 24 hours) at 01/01/2020 0810 Last data filed at 01/01/2020 0700 Gross per 24 hour  Intake 1563.2 ml  Output 165 ml  Net 1398.2 ml   Filed Weights   12/17/2019 2152 12/31/19 0500 01/01/20 0215  Weight: 62.6 kg 65.4 kg 66.8 kg    Ill-appearing woman lying in bed no acute distress [place]/AT, right eye with scleral edema.  Pupils pinpoint Bradycardic, no murmurs Clear to auscultation bilaterally, not breathing above the vent Under neuromuscular blockade; pupils  nonreactive No peripheral edema, no clubbing or cyanosis. Right radial A-line with good distal perfusion.   Resolved Hospital Problem list     Assessment & Plan:   Acute cardiopulmonary arrest -Possibly related to electrolyte disturbance prolonged QTC worsening heart failure, no signs of acute ischemia on EKG and troponin mildly elevated, although echocardiogram suggests against acute MI. -ROSC obtained after 15 minutes  of CPR -CT head without acute findings P: -Continue daily aspirin -Continue TTM, currently rewarming.  Once she reaches normothermia, will stop NMB & sedation. -Continuous EEG -Maintain full vent support.  Prevent hyperoxia or hypocapnia. -HOB elevated 30 degrees. -VAP prevention protocol -stop antibiotics with negative cultures  Acute respiratory failure requiring mechanical ventilation post cardiac arrest -Continue full vent support; decreasing RR to 8, FiO2 decreased  based on ABG. Vt 4-8cc/kg IBW. Goal plateau less than 30 and driving pressure less than 15. -daily SAT & SBT when appropriate -VAP prevention protocol  Anoxic encephalopathy -Continue EEG -Discontinue Versed, start propofol -Discontinue all sedating meds once neuromuscular blockade can be stopped  Covid-19 infection -Patient with low-grade fever on arrival, no URI symptoms per family unclear if contributory at all to today's events P: -Continue remdesivir and dexamethasone -Discontinue antibiotics -Follow blood cultures  HFrEF, hypertension.  Acutely worsened systolic function. -Echocardiogram 1 year ago with EF of 40 to 45% -She has recently been taken off all home antihypertensive medication except hydralazine due to borderline low blood pressure -Follows with Dr. Sallyanne Kuster P: -Continue supportive care  F/E/N: P: -Start tube feeds -Continue to monitor electrolytes  Hypokalemia- resolved Unlikely the sole cause for prolonged QTC given it's persistence after correction of electrolytes. PTA on phenergan. -Qtc 651 -Per cardiology note she has a history of prolonged QT interval, usually 480-510 ms -Last stress test in 2018 did not show evidence of ischemia she does not have a history of ventricular arrhythmia P: -Con't to avoid all QTc prolonging meds -con't to monitor   ESRD -T/TH/sat schedule -Per family it sounds like she completed most of her dialysis 3/20 P: -Appreciate nephrology's, planning  for HD tomorrow. May need to be sooner based on K+ as she rewarms -Continue to monitor electrolytes and volume status    Best practice:  Diet: TF Pain/Anxiety/Delirium protocol (if indicated): Fentanyl propofol VAP protocol (if indicated): HOB 30 degrees, daily SBT as sedation will allow DVT prophylaxis: Heparin GI prophylaxis: Protonix Glucose control: SSI Mobility: Bedrest Code Status: Full code confirmed with family Family Communication: updated daughter Rynesha Disposition: Intensive care  Labs   CBC: Recent Labs  Lab 12/29/2019 2007 01/02/2020 2015 12/31/19 0230 12/31/19 0230 12/31/19 0433 12/31/19 0439 12/31/19 0950 12/31/19 1158 12/31/19 1758 01/01/20 0401 01/01/20 0432  WBC 12.3*  --  8.4  --  6.8  --   --   --  4.6  --  7.4  NEUTROABS 8.5*  --   --   --   --   --   --   --  3.2  --   --   HGB 7.1*   < > 6.5*  6.5*   < > 7.9*   < > 7.5* 7.5* 7.8* 8.5* 9.0*  HCT 23.6*   < > 19.0*  20.2*   < > 24.0*   < > 22.0* 22.0* 23.7* 25.0* 27.5*  MCV 97.5  --  91.4  --  89.6  --   --   --  89.8  --  90.5  PLT 213  --  185  --  167  --   --   --  155  --  175   < > = values in this interval not displayed.    Basic Metabolic Panel: Recent Labs  Lab 12/18/2019 2007 12/11/2019 2015 12/31/19 0433 12/31/19 0439 12/31/19 1353 12/31/19 2008 01/01/20 0202 01/01/20 0401 01/01/20 0703  NA 136   < > 134*   < > 136 136 136 135 136  K 3.0*   < > 2.6*   < > 4.0 4.6 4.5 4.9 5.1  CL 97*   < > 100  --  104 105 105  --  105  CO2 22   < > 20*  --  22 21* 19*  --  20*  GLUCOSE 205*   < > 194*  --  157* 115* 140*  --  165*  BUN 14   < > 16  --  18 21 23   --  26*  CREATININE 2.65*   < > 2.48*  --  2.62* 2.74* 2.87*  --  3.12*  CALCIUM 7.0*   < > 6.8*  --  7.2* 7.2* 7.5*  --  7.4*  MG 1.7  --  2.1  --  2.0  --  1.9  --   --   PHOS  --   --  1.6*  --  4.0  --  4.9*  --   --    < > = values in this interval not displayed.   GFR: Estimated Creatinine Clearance: 17.8 mL/min (A) (by C-G  formula based on SCr of 3.12 mg/dL (H)). Recent Labs  Lab 12/23/2019 2007 12/18/2019 2007 01/01/2020 2243 12/31/19 0230 12/31/19 0433 12/31/19 1758 01/01/20 0202 01/01/20 0432  PROCALCITON  --   --   --  9.78  --   --  24.23  --   WBC 12.3*   < >  --  8.4 6.8 4.6  --  7.4  LATICACIDVEN 6.2*  --  1.6  --   --   --   --   --    < > = values in this interval not displayed.    Liver Function Tests: Recent Labs  Lab 12/20/2019 2007 12/31/19 1353 01/01/20 0202  AST 105* 53* 50*  ALT 45* 30 33  ALKPHOS 70 52 51  BILITOT 0.5 0.6 0.5  PROT 5.4* 4.3* 4.5*  ALBUMIN 1.8* 1.3* 1.3*   No results for input(s): LIPASE, AMYLASE in the last 168 hours. No results for input(s): AMMONIA in the last 168 hours.  ABG    Component Value Date/Time   PHART 7.481 (H) 01/01/2020 0401   PCO2ART 28.2 (L) 01/01/2020 0401   PO2ART 180.0 (H) 01/01/2020 0401   HCO3 21.8 01/01/2020 0401   TCO2 23 01/01/2020 0401   ACIDBASEDEF 2.0 01/01/2020 0401   O2SAT 100.0 01/01/2020 0401     Coagulation Profile: Recent Labs  Lab 01/03/2020 2007 12/31/19 0433 12/31/19 1140 01/01/20 0432  INR 1.1 1.1 1.2 1.1    Cardiac Enzymes: No results for input(s): CKTOTAL, CKMB, CKMBINDEX, TROPONINI in the last 168 hours.  HbA1C: HbA1c, POC (prediabetic range)  Date/Time Value Ref Range Status  10/18/2018 10:39 AM 6.0 5.7 - 6.4 % Final   Hgb A1c MFr Bld  Date/Time Value Ref Range Status  11/20/2019 01:00 PM 7.4 (H) 4.8 - 5.6 % Final    Comment:    (NOTE)         Prediabetes: 5.7 - 6.4         Diabetes: >6.4  Glycemic control for adults with diabetes: <7.0   06/16/2019 09:20 PM 7.0 (H) 4.8 - 5.6 % Final    Comment:    (NOTE) Pre diabetes:          5.7%-6.4% Diabetes:              >6.4% Glycemic control for   <7.0% adults with diabetes     CBG: Recent Labs  Lab 12/31/19 2008 12/31/19 2121 01/01/20 0003 01/01/20 0207 01/01/20 0402  GLUCAP 111* 101* 117* 143* 144*     This patient is  critically ill with multiple organ system failure which requires frequent high complexity decision making, assessment, support, evaluation, and titration of therapies. This was completed through the application of advanced monitoring technologies and extensive interpretation of multiple databases. During this encounter critical care time was devoted to patient care services described in this note for 45 minutes.  Julian Hy, DO 01/01/20 8:52 AM [facility]

## 2020-01-01 NOTE — Progress Notes (Signed)
[facility] Progress Note  Subjective: seen in ICU, on vent and sedated  Vitals:   01/01/20 1000 01/01/20 1100 01/01/20 1145 01/01/20 1200  BP: (!) 82/50 (!) 117/58  108/73  Pulse: (!) 50 (!) 51  (!) 53  Resp: (!) 0 (!) 0 (!) 8 (!) 0  Temp:    (!) 95.7 F (35.4 C)  TempSrc:      SpO2: 100% 100%  98%  Weight:      Height:        Exam: Gen on vent, NG tube in ETT tube in place No jvd or bruits Chest clear bilat ant and lat RRR no MRG Abd soft ntnd no mass or ascites +bs Ext 1+ LE edema Neuro is sedated on vent R IJ TDC in upper chest    Home meds:  - glargine insulin 6u hs/ novolog 2u ac tid prn  - lipitor 10/ ecasa 81  - renagel ac tid/ rocaltrol 0.25 qd  - carvedilol 12.5 bid/ hydralazine 25 bid  - oxycodone 36m qid prn/ gabapentin 100 hs/ celexa 20 qd  - prn's/ vitamins/ supplements     K+ on admit 2.8, Na 133, BUN 15 and creat 2.49.  Mg 2.1. Alb 1.8, AST 105 and ALT 45   Hb 5.8 on admit, 8.2 today after 1u prbc's.     CXR shows patchy L basilar infiltrate, no edema    Outpt HD: [facility] TTS    400/800  62kg  Hep 1900  R IJ TDC  - mircera 225 q2wks , last 3/18  - calc tiw  3/21 CXR - LLL patchy infiltrates atx vs infection  Assessment/ Plan: 1. Cardiac arrest - on cooling protocol, per CCM 2. Acute resp failure - on vent 3. COVID-19 + - incidental finding, getting remdesivir and decadron. Poss LLL infiltrates.  4. ESRD - HD TTS. Last HD Sat as outpt. Plan HD tomorrow in ICU.  5. HTN/ Vol - up 2-3 kg today after NS boluses. CXR no edema. BP's stable to low, a-line BP's better. UF 2L w/ HD tomorrow as tol.   6. Anemia ckd - Hb 8.0, last esa on 3/18 not due to 2 wks 7. IDDM    RKelly Splinter3/22/2021, 12:50 PM   Recent Labs  Lab 12/31/19 1353 12/31/19 1758 01/01/20 0202 01/01/20 0401 01/01/20 0432 01/01/20 0703 01/01/20 0822 01/01/20 0822 01/01/20 1029 01/01/20 1132  K 4.0   < > 4.5   < >  --    < > 5.0   < > 4.8 5.0  BUN 18    < > 23  --   --    < > 26*  --   --  29*  CREATININE 2.62*   < > 2.87*  --   --    < > 3.08*  --   --  3.20*  CALCIUM 7.2*   < > 7.5*  --   --    < > 7.4*  --   --  7.5*  PHOS 4.0  --  4.9*  --   --   --   --   --   --   --   HGB  --    < >  --    < > 9.0*  --   --   --  8.5*  --    < > = values in this interval not displayed.   Inpatient medications: . artificial tears  1 application Both Eyes QG0F .  aspirin EC  81 mg Oral Daily  . chlorhexidine gluconate (MEDLINE KIT)  15 mL Mouth Rinse BID  . Chlorhexidine Gluconate Cloth  6 each Topical Daily  . dexamethasone (DECADRON) injection  6 mg Intravenous Q24H  . feeding supplement (PRO-STAT SUGAR FREE 64)  30 mL Per Tube BID  . feeding supplement (VITAL HIGH PROTEIN)  1,000 mL Per Tube Q24H  . heparin  7,500 Units Subcutaneous Q8H  . insulin aspart  1-3 Units Subcutaneous Q4H  . mouth rinse  15 mL Mouth Rinse 10 times per day  . pantoprazole (PROTONIX) IV  40 mg Intravenous QHS   . cisatracurium (NIMBEX) infusion Stopped (01/01/20 1200)  . fentaNYL infusion INTRAVENOUS 200 mcg/hr (01/01/20 1244)  . norepinephrine (LEVOPHED) Adult infusion 4 mcg/min (01/01/20 1245)  . propofol (DIPRIVAN) infusion 20 mcg/kg/min (01/01/20 1200)  . remdesivir 100 mg in NS 100 mL Stopped (01/01/20 1101)   [COMPLETED] cisatracurium **AND** cisatracurium (NIMBEX) infusion **AND** cisatracurium, fentaNYL, hydrALAZINE, midazolam

## 2020-01-01 NOTE — Progress Notes (Signed)
Renal Navigator notified OP HD clinic/[facility] of patient's positive COVID test, as well as COVID isolation shift/[facility] should patient need OP HD treatment in isolation post discharge. Renal Navigator to follow.  Alphonzo Cruise, [place] [facility]al Navigator [phone]

## 2020-01-01 NOTE — Progress Notes (Signed)
Pt arrived from [facility] with 1 small plastic cup that contained pts jewelry, ear rings and silver bracelet.

## 2020-01-01 NOTE — Progress Notes (Signed)
Assisted daughter with camera/video time via elink 

## 2020-01-01 NOTE — Progress Notes (Signed)
LTM maint complete - no skin breakdown under: UJ3,WM6,E4

## 2020-01-01 NOTE — Progress Notes (Signed)
Initial Nutrition Assessment  DOCUMENTATION CODES:   Not applicable  INTERVENTION:   Tube Feeding:  Vital 1.5 at 40 ml/hr Pro-Stat 30 mL BID Provides 1640 kcals, 95 g of protein and 730 mL of free water  Add Renal MVI daily   NUTRITION DIAGNOSIS:   Inadequate oral intake related to acute illness as evidenced by NPO status.  GOAL:   Patient will meet greater than or equal to 90% of their needs  MONITOR:   Vent status, Labs, Weight trends, TF tolerance, Skin  REASON FOR ASSESSMENT:   Consult, Ventilator Enteral/tube feeding initiation and management  ASSESSMENT:   64 yo female admitted post cardiac arrest, COVID19. PMH includes ESRD on HD, CHF, DM, HTN, STEMI  3/20 Admitted  TTM, currently rewarming  Patient is currently intubated on ventilator support, currently sedated on propofol, fentanyl and versed. Paralyzed on nimbex, requiring levophed MV: 3.6 L/min Temp (24hrs), Avg:92.4 F (33.6 C), Min:91 F (32.8 C), Max:95.7 F (35.4 C)  Propofol: 8 ml/hr  Unable to obtain diet and weight history from patient at this time  Outpatient HD EDW 62 kg. Current weight 66.8 kg Last HD on 3/20, plan for iHD tomorrow.   Labs: reviewed Meds: decadron, ss novolog   Diet Order:   Diet Order            Diet NPO time specified  Diet effective now              EDUCATION NEEDS:   Not appropriate for education at this time  Skin:  Skin Assessment: Reviewed RN Assessment  Last BM:  no documented BM  Height:   Ht Readings from Last 1 Encounters:  12/31/19 5\' 4"  (1.626 m)    Weight:   Wt Readings from Last 1 Encounters:  01/01/20 66.8 kg    BMI:  Body mass index is 25.28 kg/m.  Estimated Nutritional Needs:   Kcal:  1550 kcals  Protein:  93-111 g  Fluid:  1000 mL plus UOP   Kerman Passey MS, RDN, LDN, CNSC RD Pager Number and Weekend/On-Call After Hours Pager Located in [place]

## 2020-01-01 NOTE — Procedures (Addendum)
Patient Name: Patricia [name]  MRN: [id number]  Epilepsy Attending: Lora Havens  Referring Physician/Provider: Dr. Mickel Baas Gleason Duration: 12/31/2019 1353 to 01/01/2020 1353  Patient history: 64 year old female presented with cardiac arrest on TTM.  EEG to evaluate for seizures.  Level of alertness: Comatose  AEDs during EEG study: Versed  Technical aspects: This EEG study was done with scalp electrodes positioned according to the 10-20 International system of electrode placement. Electrical activity was acquired at a sampling rate of 500Hz  and reviewed with a high frequency filter of 70Hz  and a low frequency filter of 1Hz . EEG data were recorded continuously and digitally stored.   Description: EEG showed continuous generalized background attenuation.  At times, generalized 3 to 5 Hz theta-delta slowing was noted.  Reactivity was not tested during the study.  Hyperventilation and photic stimulation were not performed.  Abnormality - Background attenuation, generalized - Intermittent slow, generalized  IMPRESSION: This study is suggestive of profound diffuse encephalopathy, nonspecific to etiology. No seizures or epileptiform discharges were seen throughout the recording.

## 2020-01-02 ENCOUNTER — Inpatient Hospital Stay (HOSPITAL_COMMUNITY): Payer: Medicare HMO

## 2020-01-02 DIAGNOSIS — E1165 Type 2 diabetes mellitus with hyperglycemia: Secondary | ICD-10-CM

## 2020-01-02 DIAGNOSIS — Z794 Long term (current) use of insulin: Secondary | ICD-10-CM

## 2020-01-02 DIAGNOSIS — I5020 Unspecified systolic (congestive) heart failure: Secondary | ICD-10-CM

## 2020-01-02 LAB — BASIC METABOLIC PANEL
Anion gap: 12 (ref 5–15)
Anion gap: 12 (ref 5–15)
BUN: 41 mg/dL — ABNORMAL HIGH (ref 8–23)
BUN: 43 mg/dL — ABNORMAL HIGH (ref 8–23)
CO2: 20 mmol/L — ABNORMAL LOW (ref 22–32)
CO2: 20 mmol/L — ABNORMAL LOW (ref 22–32)
Calcium: 7.5 mg/dL — ABNORMAL LOW (ref 8.9–10.3)
Calcium: 7.4 mg/dL — ABNORMAL LOW (ref 8.9–10.3)
Chloride: 104 mmol/L (ref 98–111)
Chloride: 105 mmol/L (ref 98–111)
Creatinine, Ser: 4.03 mg/dL — ABNORMAL HIGH (ref 0.44–1.00)
Creatinine, Ser: 4.21 mg/dL — ABNORMAL HIGH (ref 0.44–1.00)
GFR calc Af Amer: 13 mL/min — ABNORMAL LOW (ref >60)
GFR calc Af Amer: 12 mL/min — ABNORMAL LOW (ref >60)
GFR calc non Af Amer: 11 mL/min — ABNORMAL LOW (ref >60)
GFR calc non Af Amer: 11 mL/min — ABNORMAL LOW (ref >60)
Glucose, Bld: 299 mg/dL — ABNORMAL HIGH (ref 70–99)
Glucose, Bld: 309 mg/dL — ABNORMAL HIGH (ref 70–99)
Potassium: 5.6 mmol/L — ABNORMAL HIGH (ref 3.5–5.1)
Potassium: 5.5 mmol/L — ABNORMAL HIGH (ref 3.5–5.1)
Sodium: 136 mmol/L (ref 135–145)
Sodium: 137 mmol/L (ref 135–145)

## 2020-01-02 LAB — CBC
HCT: 27 % — ABNORMAL LOW (ref 36.0–46.0)
Hemoglobin: 8.5 g/dL — ABNORMAL LOW (ref 12.0–15.0)
MCH: 29.8 pg (ref 26.0–34.0)
MCHC: 31.5 g/dL (ref 30.0–36.0)
MCV: 94.7 fL (ref 80.0–100.0)
Platelets: 332 10*3/uL (ref 150–400)
RBC: 2.85 MIL/uL — ABNORMAL LOW (ref 3.87–5.11)
RDW: 16.7 % — ABNORMAL HIGH (ref 11.5–15.5)
WBC: 16.6 10*3/uL — ABNORMAL HIGH (ref 4.0–10.5)
nRBC: 0 % (ref 0.0–0.2)

## 2020-01-02 LAB — COMPREHENSIVE METABOLIC PANEL
ALT: 29 U/L (ref 0–44)
AST: 40 U/L (ref 15–41)
Albumin: 1.5 g/dL — ABNORMAL LOW (ref 3.5–5.0)
Alkaline Phosphatase: 61 U/L (ref 38–126)
Anion gap: 10 (ref 5–15)
BUN: 45 mg/dL — ABNORMAL HIGH (ref 8–23)
CO2: 20 mmol/L — ABNORMAL LOW (ref 22–32)
Calcium: 7.4 mg/dL — ABNORMAL LOW (ref 8.9–10.3)
Chloride: 106 mmol/L (ref 98–111)
Creatinine, Ser: 4.25 mg/dL — ABNORMAL HIGH (ref 0.44–1.00)
GFR calc Af Amer: 12 mL/min — ABNORMAL LOW (ref >60)
GFR calc non Af Amer: 11 mL/min — ABNORMAL LOW (ref >60)
Glucose, Bld: 313 mg/dL — ABNORMAL HIGH (ref 70–99)
Potassium: 5.4 mmol/L — ABNORMAL HIGH (ref 3.5–5.1)
Sodium: 136 mmol/L (ref 135–145)
Total Bilirubin: 0.4 mg/dL (ref 0.3–1.2)
Total Protein: 4.8 g/dL — ABNORMAL LOW (ref 6.5–8.1)

## 2020-01-02 LAB — GLUCOSE, CAPILLARY
Glucose-Capillary: 294 mg/dL — ABNORMAL HIGH (ref 70–99)
Glucose-Capillary: 226 mg/dL — ABNORMAL HIGH (ref 70–99)
Glucose-Capillary: 269 mg/dL — ABNORMAL HIGH (ref 70–99)
Glucose-Capillary: 288 mg/dL — ABNORMAL HIGH (ref 70–99)
Glucose-Capillary: 216 mg/dL — ABNORMAL HIGH (ref 70–99)

## 2020-01-02 LAB — PHOSPHORUS: Phosphorus: 7.3 mg/dL — ABNORMAL HIGH (ref 2.5–4.6)

## 2020-01-02 LAB — HEPATITIS B SURFACE ANTIGEN: Hepatitis B Surface Ag: NONREACTIVE

## 2020-01-02 LAB — PROCALCITONIN: Procalcitonin: 21.64 ng/mL

## 2020-01-02 LAB — TRIGLYCERIDES: Triglycerides: 82 mg/dL (ref <150)

## 2020-01-02 LAB — MAGNESIUM: Magnesium: 2.1 mg/dL (ref 1.7–2.4)

## 2020-01-02 MED ORDER — RENA-VITE PO TABS
1.0000 | ORAL_TABLET | Freq: Every day | ORAL | Status: DC
  Administered 2020-01-03 – 2020-01-19 (×15): 1
  Filled 2020-01-02 (×15): qty 1

## 2020-01-02 MED ORDER — INSULIN GLARGINE 100 UNIT/ML ~~LOC~~ SOLN
10.0000 [IU] | Freq: Every day | SUBCUTANEOUS | Status: DC
  Administered 2020-01-02: 10 [IU] via SUBCUTANEOUS
  Filled 2020-01-02: qty 0.1

## 2020-01-02 MED ORDER — INSULIN ASPART 100 UNIT/ML ~~LOC~~ SOLN
4.0000 [IU] | SUBCUTANEOUS | Status: DC
  Administered 2020-01-02 – 2020-01-03 (×6): 4 [IU] via SUBCUTANEOUS

## 2020-01-02 MED ORDER — HEPARIN SODIUM (PORCINE) 1000 UNIT/ML IJ SOLN
INTRAMUSCULAR | Status: AC
  Administered 2020-01-02: 10:00:00 1000 [IU]
  Filled 2020-01-02: qty 4

## 2020-01-02 MED ORDER — ASPIRIN 81 MG PO CHEW
81.0000 mg | CHEWABLE_TABLET | Freq: Every day | ORAL | Status: DC
  Administered 2020-01-03 – 2020-01-07 (×5): 81 mg
  Filled 2020-01-02 (×5): qty 1

## 2020-01-02 MED ORDER — INSULIN GLARGINE 100 UNIT/ML ~~LOC~~ SOLN
10.0000 [IU] | Freq: Two times a day (BID) | SUBCUTANEOUS | Status: DC
  Administered 2020-01-02: 21:00:00 10 [IU] via SUBCUTANEOUS
  Filled 2020-01-02 (×3): qty 0.1

## 2020-01-02 MED ORDER — INSULIN ASPART 100 UNIT/ML ~~LOC~~ SOLN
0.0000 [IU] | SUBCUTANEOUS | Status: DC
  Administered 2020-01-02: 17:00:00 12 [IU] via SUBCUTANEOUS
  Administered 2020-01-02: 21:00:00 8 [IU] via SUBCUTANEOUS
  Administered 2020-01-02: 9 [IU] via SUBCUTANEOUS
  Administered 2020-01-02: 12 [IU] via SUBCUTANEOUS
  Administered 2020-01-02: 8 [IU] via SUBCUTANEOUS
  Administered 2020-01-02: 04:00:00 12 [IU] via SUBCUTANEOUS
  Administered 2020-01-03 (×2): 8 [IU] via SUBCUTANEOUS
  Administered 2020-01-03 (×2): 4 [IU] via SUBCUTANEOUS
  Administered 2020-01-03: 12 [IU] via SUBCUTANEOUS
  Administered 2020-01-03: 4 [IU] via SUBCUTANEOUS
  Administered 2020-01-04 (×2): 2 [IU] via SUBCUTANEOUS
  Administered 2020-01-04 (×2): 4 [IU] via SUBCUTANEOUS
  Administered 2020-01-04 – 2020-01-06 (×6): 2 [IU] via SUBCUTANEOUS
  Administered 2020-01-06: 8 [IU] via SUBCUTANEOUS
  Administered 2020-01-06 (×2): 2 [IU] via SUBCUTANEOUS
  Administered 2020-01-06 – 2020-01-07 (×2): 12 [IU] via SUBCUTANEOUS
  Administered 2020-01-07: 08:00:00 4 [IU] via SUBCUTANEOUS
  Administered 2020-01-07: 04:00:00 2 [IU] via SUBCUTANEOUS
  Administered 2020-01-07 – 2020-01-08 (×3): 4 [IU] via SUBCUTANEOUS
  Administered 2020-01-08: 8 [IU] via SUBCUTANEOUS
  Administered 2020-01-08: 2 [IU] via SUBCUTANEOUS
  Administered 2020-01-09 (×2): 4 [IU] via SUBCUTANEOUS
  Administered 2020-01-09: 8 [IU] via SUBCUTANEOUS
  Administered 2020-01-09: 12:00:00 2 [IU] via SUBCUTANEOUS
  Administered 2020-01-09: 4 [IU] via SUBCUTANEOUS
  Administered 2020-01-10 (×2): 2 [IU] via SUBCUTANEOUS

## 2020-01-02 NOTE — Progress Notes (Signed)
LTM EEG discontinued - no skin breakdown at unhook.   

## 2020-01-02 NOTE — Progress Notes (Signed)
Patient ID: Patricia [name], female   DOB: Nov 25, 1955, 64 y.o.   MRN: [id number]  [facility] Progress Note   Assessment/ Plan:   1.  Cardiac arrest/acute respiratory failure: Intubated and remains on ventilator support with mild sedation and low-dose pressor requirement.  Management per CCM.  Neurology findings reviewed. 2. ESRD: Continue hemodialysis on TTS schedule, she is able to tolerate hemodialysis with some ultrafiltration while on low-dose Levophed. 3. Anemia: Downtrending hemoglobin/hematocrit without overt loss, will continue to follow on ESA anticipating resistance with COVID-19. 4. 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 while intubated, monitor calcium/phosphorus with hemodialysis. 5.  COVID-19 infection: Coincidental finding and currently on management with Decadron and remdesivir.  Subjective:   Without acute clinical events overnight, hyperglycemia noted.   Objective:   BP 110/63   Pulse 68   Temp 98.6 F (37 C) (Bladder)   Resp (!) 8   Ht _0  (1.626 m)   Wt 69.8 kg   SpO2 100%   BMI 26.41 kg/m   Physical Exam: Gen: Intubated, sedated, on hemodialysis CVS: Pulse regular rhythm, normal rate Resp: Anteriorly clear to auscultation, no distinct rales or rhonchi.  Right IJ TDC Abd: Soft, flat, nontender Ext: Trace lower extremity edema  Labs: BMET Recent Labs  Lab 12/31/19 0433 12/31/19 0439 12/31/19 1353 12/31/19 2008 01/01/20 0202 01/01/20 0401 01/01/20 1132 01/01/20 1342 01/01/20 1626 01/01/20 1810 01/01/20 1930 01/01/20 2200 01/02/20 0000 01/02/20 0200 01/02/20 0355  NA 134*   < > 136   < > 136   < >   < >  --  136 137 135 137 136 137 136  K 2.6*   < > 4.0   < > 4.5   < >   < >  --  5.1 5.3* 5.1 5.3* 5.6* 5.5* 5.4*  CL 100   < > 104   < > 105   < >   < >  --  105 104 104 106 104 105 106  CO2 20*   < > 22   < > 19*   < >   < >  --  22 21* 21* 21* 20* 20* 20*  GLUCOSE 194*   < > 157*   < > 140*   < >   < >  --  210* 222* 260* 279* 299* 309* 313*   BUN 16   < > 18   < > 23   < >   < >  --  32* 35* 36* 38* 41* 43* 45*  CREATININE 2.48*   < > 2.62*   < > 2.87*   < >   < >  --  3.42* 3.62* 3.71* 3.87* 4.03* 4.21* 4.25*  CALCIUM 6.8*   < > 7.2*   < > 7.5*   < >   < >  --  7.4* 7.7* 7.5* 7.5* 7.5* 7.4* 7.4*  PHOS 1.6*  --  4.0  --  4.9*  --   --  6.9* 7.2*  --   --   --   --   --  7.3*   < > = values in this interval not displayed.   CBC Recent Labs  Lab 12/18/2019 2007 12/24/2019 2015 12/31/19 0433 12/31/19 0439 12/31/19 1758 12/31/19 1758 01/01/20 0401 01/01/20 0432 01/01/20 1029 01/02/20 0355  WBC 12.3*   < > 6.8  --  4.6  --   --  7.4  --  16.6*  NEUTROABS 8.5*  --   --   --  3.2  --   --   --   --   --   HGB 7.1*   < > 7.9*   < > 7.8*   < > 8.5* 9.0* 8.5* 8.5*  HCT 23.6*   < > 24.0*   < > 23.7*   < > 25.0* 27.5* 25.0* 27.0*  MCV 97.5   < > 89.6  --  89.8  --   --  90.5  --  94.7  PLT 213   < > 167  --  155  --   --  175  --  332   < > = values in this interval not displayed.      Medications:    . artificial tears  1 application Both Eyes O9P  . aspirin EC  81 mg Oral Daily  . chlorhexidine gluconate (MEDLINE KIT)  15 mL Mouth Rinse BID  . Chlorhexidine Gluconate Cloth  6 each Topical Daily  . Chlorhexidine Gluconate Cloth  6 each Topical Q0600  . dexamethasone (DECADRON) injection  6 mg Intravenous Q24H  . feeding supplement (PRO-STAT SUGAR FREE 64)  30 mL Per Tube BID  . heparin      . heparin  7,500 Units Subcutaneous Q8H  . insulin aspart  0-24 Units Subcutaneous Q4H  . mouth rinse  15 mL Mouth Rinse 10 times per day  . multivitamin  1 tablet Oral QHS  . pantoprazole (PROTONIX) IV  40 mg Intravenous QHS   Elmarie Shiley, MD 01/02/2020, 8:41 AM

## 2020-01-02 NOTE — Procedures (Addendum)
Patient Name: Patricia [name]  MRN: [id number]  Epilepsy Attending: Lora Havens  Referring Physician/Provider: Dr. Mickel Baas Gleason Duration: 01/01/2020 1353 to 01/02/2020 1208 Total duration of study: 46 hours  Patient history: 64 year old female presented with cardiac arrest on TTM.  EEG to evaluate for seizures.  Level of alertness: Comatose  AEDs during EEG study: propofol  Technical aspects: This EEG study was done with scalp electrodes positioned according to the 10-20 International system of electrode placement. Electrical activity was acquired at a sampling rate of 500Hz  and reviewed with a high frequency filter of 70Hz  and a low frequency filter of 1Hz . EEG data were recorded continuously and digitally stored.   Description: EEG showed continuous generalized background attenuation.  At times, generalized 3 to 5 Hz theta-delta slowing was noted.  Reactivity was not tested during the study.  Hyperventilation and photic stimulation were not performed.  Abnormality - Background attenuation, generalized - Intermittent slow, generalized  IMPRESSION: This study is suggestive of profound diffuse encephalopathy, nonspecific to etiology. No seizures or epileptiform discharges were seen throughout the recording.  EEG appears similar to yesterday.  [name] [name]

## 2020-01-02 NOTE — Procedures (Addendum)
Patient seen on Hemodialysis. BP 110/63   Pulse 68   Temp 98.6 F (37 C) (Bladder)   Resp (!) 8   Ht 5\' 4"  (1.626 m)   Wt 69.8 kg   SpO2 100%   BMI 26.41 kg/m   QB 400, UF goal 2L Tolerating treatment without problems at this time.   Elmarie Shiley MD [facility]. Office # [phone] Pager # [phone] 8:47 AM

## 2020-01-02 NOTE — Progress Notes (Signed)
[facility] Progress Note Patient Name: Patricia [name] DOB: 1956/03/27 MRN: [id number]   Date of Service  01/02/2020  HPI/Events of Note  Elevated blood sugar  eICU Interventions  CBG Q 4 hours  + SSI coverage order placed.        Kerry Kass Tyler Robidoux 01/02/2020, 12:15 AM

## 2020-01-02 NOTE — Progress Notes (Signed)
Patients daughter Almon Register given update over the phone.

## 2020-01-02 NOTE — Progress Notes (Signed)
NAME:  Patricia [name], MRN:  [id number], DOB:  09-24-1956, LOS: 3 ADMISSION DATE:  01/10/2020, CONSULTATION DATE:  01/02/20 REFERRING MD:  Regenia Skeeter CHIEF COMPLAINT:  Cardiac Arrest   Brief History   64 y.o. F with PMH ESRD, anemia, HFrEF, Depression, Type 2 DM, HTN and obesity who presented in cardiac arrest.  Pt was in her usual state of health and went to dialysis today where she became nauseated and vomited, at home she had a syncopal episode and collapse. CPR initiated and ROSC after 55mins.    History of present illness   Dynesha [name] is a 64 y.o. F with PMH of  ESRD, anemia, HFrEF, Depression, Type 2 DM, HTN and obesity who presented in cardiac arrest. She lives with family and they state she has been in her usual state of health and went to dialysis today where she had an episode of nausea and vomiting and returned home.  There she became sweaty and collapsed, family started CPR immediately and called EMS who performed 15 minutes of CPR with ROSC, no mention of shockable rhythm.    In the ED, pt was not initially responsive and was intubated.  CT head was without acute findings, patient was hypertensive and febrile with a potassium of 2.8 and prolonged QTC.  No sign of STEMI on EKG.  Lactic acid was elevated and chest x-ray showed vascular congestion.  She was given 2 g of magnesium, broad-spectrum antibiotics and IV fluids.  Covid-19 resulted positive.  PCCM consulted for admission.  Family states that she has showed no symptoms of Covid, and they are unsure how she may have contracted this.  Past Medical History   has a past medical history of Allergy, Anemia, Anxiety, Blood transfusion without reported diagnosis, Cardiomyopathy ([place]) (11/2016), CHF (congestive heart failure) ([place]), CKD (chronic kidney disease), stage IV ([place]), Depression, Diabetes mellitus without complication ([place]), Diverticulitis, Dyspnea, Hypertension, and Obesity.   Significant Hospital Events   3/20 admit to  PCCM 3/21 TTM initiated to 33 3/22 completed TTM, rewarmed  Consults:  Nephrology  Procedures:  3/20 ET tube  Significant Diagnostic Tests:  3/20 CT head>> no acute findings 3/21 EEG>> generalized background slowing, no epileptiform activity 3/21 echocardiogram-LVEF 30 to 35%, no regional wall motion abnormalities, moderate LVH.  Grade 1 diastolic dysfunction.  Mildly dilated LA, normal RV. 3/22 EEG-generalized background attenuation with generalized slowing c/w profound diffuse encephalopathy  Micro Data:  3/20 SARS-Cov-2>> positive  Antimicrobials:  Cefepime 3/20- Flagyl 3/20 only Vancomycin 3/20-  Interim history/subjective:  HD this morning. Propofol off overnight. Fentanyl off this morning.  Objective   Blood pressure 94/81, pulse 80, temperature 98.6 F (37 C), temperature source Bladder, resp. rate (!) 25, height 5\' 4"  (1.626 m), weight 69.8 kg, SpO2 100 %. CVP:  [2 mmHg-6 mmHg] 4 mmHg  Vent Mode: PRVC FiO2 (%):  [25 %-40 %] 30 % Set Rate:  [8 bmp] 8 bmp Vt Set:  [430 mL] 430 mL PEEP:  [5 cmH20] 5 cmH20 Plateau Pressure:  [9 cmH20-16 cmH20] 11 cmH20   Intake/Output Summary (Last 24 hours) at 01/02/2020 1024 Last data filed at 01/02/2020 1000 Gross per 24 hour  Intake 1507.09 ml  Output 13 ml  Net 1494.09 ml   Filed Weights   01/01/20 0215 01/02/20 0402 01/02/20 0646  Weight: 66.8 kg 69.8 kg 69.8 kg    General: Critically ill appearing woman laying in bed intubated, lightly sedated, receiving hemodialysis HEENT: [place]/AT, eyes anicteric. Cardio: Regular rate and rhythm, no  murmurs Respiratory, bradypnea, breathing above the set rate on the vent, clear to auscultation bilaterally. Abdomen soft, nontender, nondistended Extremities no peripheral edema, no clubbing or cyanosis.  Right radial A-line with good distal perfusion. Derm: No rashes or wounds Neuro: No response to verbal stimulation.  PERRL, no corneal or oculocephalic reflexes.  Cough and blinking with  tracheal suctioning, but not pharyngeal suctioning.  Minimal toe extension with deep nailbed pressure on her left first toe, otherwise no response to nailbed pressure in any extremity.    Resolved Hospital Problem list     Assessment & Plan:   Acute cardiopulmonary arrest -Possibly related to electrolyte disturbance prolonged QTC worsening heart failure, no signs of acute ischemia on EKG and troponin mildly elevated, although echocardiogram suggests against acute MI. -ROSC obtained after 15 minutes of CPR -CT head without acute findings P: -Continue daily aspirin -Continue TTM to maintain normothermia.  No longer requiring sedation. -EEG per protocol.  Will be stopping soon.  Appreciate neurology's assistance. -Continue full vent support.  Prevent hyperoxia or hypercapnia. -Head of bed greater than 30 degrees -Back prevention protocol -Continue to follow cultures  Acute respiratory failure requiring mechanical ventilation post cardiac arrest -Continue full vent support; decreasing RR to 8, FiO2 decreased  based on ABG. Vt 4-8cc/kg IBW. Goal plateau less than 30 and driving pressure less than 15. -daily SAT & SBT when appropriate -VAP prevention protocol  Leukocytosis of unclear etiology.  On TTM, so unable to assess for fevers.  Previous cultures negative. -Continue to monitor -Trach aspirate -If worsening tomorrow, may need to restart empiric antibiotics -CXR  Anoxic encephalopathy -Continue EEG per protocol -Continue TTM to maintain normothermia -DC all sedation except as needed fentanyl.  Minimize sedation as much as possible to facilitate neuro prognostication.  Covid-19 infection -Patient with low-grade fever on arrival, no URI symptoms per family unclear if contributory at all to today's events P: -Con't dexamethasone and remdesivir -con't to follow cultures  HFrEF, hypertension.  Acutely worsened systolic function post-arrest. Bradycardia resolved post-  rewarming. -Echocardiogram 1 year ago with EF of 40 to 45% -She has recently been taken off all home antihypertensive medication except hydralazine due to borderline low blood pressure -Follows with Dr. Sallyanne Kuster P: -Continue supportive care -Continue norepinephrine as required to maintain MAP greater than 65.  Hyperglycemia, uncontrolled. DM2 with a1c 7.4 -Lantus 10 units once daily -NovoLog 4 units every 4 hours in 6 weeks -Sliding scale insulin as needed -Continue Accu-Cheks every 4 hours -Goal BG 140-180 while mated to the ICU  F/E/N: P: -Continue tube feeds -Continue to monitor electrolytes  Hypokalemia- resolved Unlikely the sole cause for prolonged QTC given it's persistence after correction of electrolytes. PTA on phenergan. -Qtc 651 -Per cardiology note she has a history of prolonged QT interval, usually 480-510 ms -Last stress test in 2018 did not show evidence of ischemia she does not have a history of ventricular arrhythmia P: -Continue to avoid all QTC prolonging meds -Serial QTC monitoring   ESRD -T/TH/sat schedule -Per family it sounds like she completed most of her dialysis 3/20 P: -Appreciate nephrology's assistance.  HD this morning.  Continue to monitor electrolytes and volume status.    Best practice:  Diet: TF Pain/Anxiety/Delirium protocol (if indicated): Fentanyl propofol VAP protocol (if indicated): HOB 30 degrees, daily SBT as sedation will allow DVT prophylaxis: Heparin GI prophylaxis: Protonix Glucose control: SSI Mobility: Bedrest Code Status: Full code confirmed with family Family Communication: updated daughter Eliseo Gum on 3/23 Disposition: ICU  Labs  CBC: Recent Labs  Lab 01/06/2020 2007 12/31/2019 2015 12/31/19 0230 12/31/19 0230 12/31/19 0433 12/31/19 0439 12/31/19 1758 01/01/20 0401 01/01/20 0432 01/01/20 1029 01/02/20 0355  WBC 12.3*   < > 8.4  --  6.8  --  4.6  --  7.4  --  16.6*  NEUTROABS 8.5*  --   --   --   --   --   3.2  --   --   --   --   HGB 7.1*   < > 6.5*  6.5*   < > 7.9*   < > 7.8* 8.5* 9.0* 8.5* 8.5*  HCT 23.6*   < > 19.0*  20.2*   < > 24.0*   < > 23.7* 25.0* 27.5* 25.0* 27.0*  MCV 97.5   < > 91.4  --  89.6  --  89.8  --  90.5  --  94.7  PLT 213   < > 185  --  167  --  155  --  175  --  332   < > = values in this interval not displayed.    Basic Metabolic Panel: Recent Labs  Lab 12/31/19 1353 12/31/19 2008 01/01/20 0202 01/01/20 0401 01/01/20 1132 01/01/20 1342 01/01/20 1626 01/01/20 1810 01/01/20 1930 01/01/20 2200 01/02/20 0000 01/02/20 0200 01/02/20 0355  NA 136   < > 136   < >   < >  --  136   < > 135 137 136 137 136  K 4.0   < > 4.5   < >   < >  --  5.1   < > 5.1 5.3* 5.6* 5.5* 5.4*  CL 104   < > 105   < >   < >  --  105   < > 104 106 104 105 106  CO2 22   < > 19*   < >   < >  --  22   < > 21* 21* 20* 20* 20*  GLUCOSE 157*   < > 140*   < >   < >  --  210*   < > 260* 279* 299* 309* 313*  BUN 18   < > 23   < >   < >  --  32*   < > 36* 38* 41* 43* 45*  CREATININE 2.62*   < > 2.87*   < >   < >  --  3.42*   < > 3.71* 3.87* 4.03* 4.21* 4.25*  CALCIUM 7.2*   < > 7.5*   < >   < >  --  7.4*   < > 7.5* 7.5* 7.5* 7.4* 7.4*  MG 2.0  --  1.9  --   --  2.0 2.0  --   --   --   --   --  2.1  PHOS 4.0  --  4.9*  --   --  6.9* 7.2*  --   --   --   --   --  7.3*   < > = values in this interval not displayed.   GFR: Estimated Creatinine Clearance: 13.3 mL/min (A) (by C-G formula based on SCr of 4.25 mg/dL (H)). Recent Labs  Lab 01/10/2020 2007 01/07/2020 2007 12/19/2019 2243 12/31/19 0230 12/31/19 0230 12/31/19 0433 12/31/19 1758 01/01/20 0202 01/01/20 0432 01/02/20 0355  PROCALCITON  --   --   --  9.78  --   --   --  24.23  --  21.64  WBC 12.3*   < >  --  8.4   < > 6.8 4.6  --  7.4 16.6*  LATICACIDVEN 6.2*  --  1.6  --   --   --   --   --   --   --    < > = values in this interval not displayed.    Liver Function Tests: Recent Labs  Lab 12/23/2019 2007 12/31/19 1353 01/01/20 0202  01/02/20 0355  AST 105* 53* 50* 40  ALT 45* 30 33 29  ALKPHOS 70 52 51 61  BILITOT 0.5 0.6 0.5 0.4  PROT 5.4* 4.3* 4.5* 4.8*  ALBUMIN 1.8* 1.3* 1.3* 1.5*   No results for input(s): LIPASE, AMYLASE in the last 168 hours. No results for input(s): AMMONIA in the last 168 hours.  ABG    Component Value Date/Time   PHART 7.339 (L) 01/01/2020 1029   PCO2ART 40.4 01/01/2020 1029   PO2ART 99.0 01/01/2020 1029   HCO3 21.7 01/01/2020 1029   TCO2 23 01/01/2020 1029   ACIDBASEDEF 4.0 (H) 01/01/2020 1029   O2SAT 97.0 01/01/2020 1029     Coagulation Profile: Recent Labs  Lab 12/16/2019 2007 12/31/19 0433 12/31/19 1140 01/01/20 0432  INR 1.1 1.1 1.2 1.1    Cardiac Enzymes: No results for input(s): CKTOTAL, CKMB, CKMBINDEX, TROPONINI in the last 168 hours.  HbA1C: HbA1c, POC (prediabetic range)  Date/Time Value Ref Range Status  10/18/2018 10:39 AM 6.0 5.7 - 6.4 % Final   Hgb A1c MFr Bld  Date/Time Value Ref Range Status  11/20/2019 01:00 PM 7.4 (H) 4.8 - 5.6 % Final    Comment:    (NOTE)         Prediabetes: 5.7 - 6.4         Diabetes: >6.4         Glycemic control for adults with diabetes: <7.0   06/16/2019 09:20 PM 7.0 (H) 4.8 - 5.6 % Final    Comment:    (NOTE) Pre diabetes:          5.7%-6.4% Diabetes:              >6.4% Glycemic control for   <7.0% adults with diabetes     CBG: Recent Labs  Lab 01/01/20 1623 01/01/20 1948 01/01/20 2351 01/02/20 0400 01/02/20 0713  GLUCAP 187* 229* 262* 294* 226*     This patient is critically ill with multiple organ system failure which requires frequent high complexity decision making, assessment, support, evaluation, and titration of therapies. This was completed through the application of advanced monitoring technologies and extensive interpretation of multiple databases. During this encounter critical care time was devoted to patient care services described in this note for 35 minutes.  Julian Hy, DO 01/02/20  10:24 AM [facility]

## 2020-01-03 DIAGNOSIS — J189 Pneumonia, unspecified organism: Secondary | ICD-10-CM

## 2020-01-03 DIAGNOSIS — R739 Hyperglycemia, unspecified: Secondary | ICD-10-CM

## 2020-01-03 LAB — CBC WITH DIFFERENTIAL/PLATELET
Abs Immature Granulocytes: 0.22 10*3/uL — ABNORMAL HIGH (ref 0.00–0.07)
Basophils Absolute: 0 10*3/uL (ref 0.0–0.1)
Basophils Relative: 0 %
Eosinophils Absolute: 0 10*3/uL (ref 0.0–0.5)
Eosinophils Relative: 0 %
HCT: 28.2 % — ABNORMAL LOW (ref 36.0–46.0)
Hemoglobin: 9.2 g/dL — ABNORMAL LOW (ref 12.0–15.0)
Immature Granulocytes: 1 %
Lymphocytes Relative: 6 %
Lymphs Abs: 0.9 10*3/uL (ref 0.7–4.0)
MCH: 30 pg (ref 26.0–34.0)
MCHC: 32.6 g/dL (ref 30.0–36.0)
MCV: 91.9 fL (ref 80.0–100.0)
Monocytes Absolute: 0.7 10*3/uL (ref 0.1–1.0)
Monocytes Relative: 4 %
Neutro Abs: 14.1 10*3/uL — ABNORMAL HIGH (ref 1.7–7.7)
Neutrophils Relative %: 89 %
Platelets: 240 10*3/uL (ref 150–400)
RBC: 3.07 MIL/uL — ABNORMAL LOW (ref 3.87–5.11)
RDW: 16.4 % — ABNORMAL HIGH (ref 11.5–15.5)
WBC: 15.9 10*3/uL — ABNORMAL HIGH (ref 4.0–10.5)
nRBC: 0.1 % (ref 0.0–0.2)

## 2020-01-03 LAB — GLUCOSE, CAPILLARY
Glucose-Capillary: 179 mg/dL — ABNORMAL HIGH (ref 70–99)
Glucose-Capillary: 184 mg/dL — ABNORMAL HIGH (ref 70–99)
Glucose-Capillary: 190 mg/dL — ABNORMAL HIGH (ref 70–99)
Glucose-Capillary: 197 mg/dL — ABNORMAL HIGH (ref 70–99)
Glucose-Capillary: 231 mg/dL — ABNORMAL HIGH (ref 70–99)
Glucose-Capillary: 233 mg/dL — ABNORMAL HIGH (ref 70–99)
Glucose-Capillary: 264 mg/dL — ABNORMAL HIGH (ref 70–99)

## 2020-01-03 LAB — BASIC METABOLIC PANEL
Anion gap: 12 (ref 5–15)
BUN: 41 mg/dL — ABNORMAL HIGH (ref 8–23)
CO2: 24 mmol/L (ref 22–32)
Calcium: 7.2 mg/dL — ABNORMAL LOW (ref 8.9–10.3)
Chloride: 102 mmol/L (ref 98–111)
Creatinine, Ser: 3.33 mg/dL — ABNORMAL HIGH (ref 0.44–1.00)
GFR calc Af Amer: 16 mL/min — ABNORMAL LOW (ref >60)
GFR calc non Af Amer: 14 mL/min — ABNORMAL LOW (ref >60)
Glucose, Bld: 215 mg/dL — ABNORMAL HIGH (ref 70–99)
Potassium: 3.8 mmol/L (ref 3.5–5.1)
Sodium: 138 mmol/L (ref 135–145)

## 2020-01-03 LAB — PHOSPHORUS: Phosphorus: 3.1 mg/dL (ref 2.5–4.6)

## 2020-01-03 LAB — HEPATITIS B E ANTIBODY: Hep B E Ab: NEGATIVE

## 2020-01-03 LAB — MAGNESIUM: Magnesium: 1.9 mg/dL (ref 1.7–2.4)

## 2020-01-03 MED ORDER — PIPERACILLIN-TAZOBACTAM 3.375 G IVPB
3.3750 g | Freq: Two times a day (BID) | INTRAVENOUS | Status: DC
  Administered 2020-01-03 – 2020-01-07 (×9): 3.375 g via INTRAVENOUS
  Filled 2020-01-03 (×9): qty 50

## 2020-01-03 MED ORDER — CARVEDILOL 6.25 MG PO TABS
6.2500 mg | ORAL_TABLET | Freq: Two times a day (BID) | ORAL | Status: DC
  Administered 2020-01-03 (×2): 6.25 mg via ORAL
  Filled 2020-01-03 (×2): qty 1

## 2020-01-03 MED ORDER — INSULIN GLARGINE 100 UNIT/ML ~~LOC~~ SOLN
20.0000 [IU] | Freq: Two times a day (BID) | SUBCUTANEOUS | Status: DC
  Administered 2020-01-03 – 2020-01-07 (×9): 20 [IU] via SUBCUTANEOUS
  Filled 2020-01-03 (×12): qty 0.2

## 2020-01-03 MED ORDER — INSULIN ASPART 100 UNIT/ML ~~LOC~~ SOLN
6.0000 [IU] | SUBCUTANEOUS | Status: DC
  Administered 2020-01-03: 11:00:00 6 [IU] via SUBCUTANEOUS

## 2020-01-03 MED ORDER — INSULIN ASPART 100 UNIT/ML ~~LOC~~ SOLN
8.0000 [IU] | SUBCUTANEOUS | Status: DC
  Administered 2020-01-03 – 2020-01-07 (×20): 8 [IU] via SUBCUTANEOUS

## 2020-01-03 NOTE — Progress Notes (Signed)
[facility] Progress Note Patient Name: Patricia [name] DOB: 1955/10/14 MRN: [id number]   Date of Service  01/03/2020  HPI/Events of Note  Pt needs order for morning labs  eICU Interventions  a.m. labs ordered.        Kerry Kass Erion Hermans 01/03/2020, 3:11 AM

## 2020-01-03 NOTE — Progress Notes (Signed)
Patient ID: Patricia [name], female   DOB: 11-18-1955, 64 y.o.   MRN: [id number]  [facility] Progress Note   Assessment/ Plan:   1.  Cardiac arrest/acute respiratory failure: Intubated and remains on ventilator support- her neurological status will direct ability to wean. 2. ESRD: Continue hemodialysis on TTS schedule- ordered again for tomorrow.  3. Anemia: Downtrending hemoglobin/hematocrit without overt loss, will continue to follow on ESA anticipating resistance with COVID-19. 4. 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 while intubated, calcium and phosphorus levels at goal. 5.  COVID-19 infection: Incidental finding and currently on management with Decadron and remdesivir.  Subjective:   Without acute clinical events overnight, opens eyes to verbal command but no response to pain.   Objective:   BP (!) 183/66   Pulse (!) 106   Temp 99 F (37.2 C) (Oral)   Resp 10   Ht 5' 4"  (1.626 m)   Wt 67.7 kg   SpO2 100%   BMI 25.62 kg/m   Physical Exam: Gen: Intubated, resting comfortably CVS: Pulse regular rhythm, normal rate Resp: Anteriorly clear to auscultation, no distinct rales or rhonchi.  Right IJ TDC Abd: Soft, flat, nontender Ext: Trace lower extremity edema  Labs: BMET Recent Labs  Lab 12/31/19 0433 12/31/19 0439 12/31/19 1353 12/31/19 2008 01/01/20 0202 01/01/20 0401 01/01/20 1132 01/01/20 1342 01/01/20 1626 01/01/20 1626 01/01/20 1810 01/01/20 1930 01/01/20 2200 01/02/20 0000 01/02/20 0200 01/02/20 0355 01/03/20 0350  NA 134*   < > 136   < > 136   < >   < >  --  136   < > 137 135 137 136 137 136 138  K 2.6*   < > 4.0   < > 4.5   < >   < >  --  5.1   < > 5.3* 5.1 5.3* 5.6* 5.5* 5.4* 3.8  CL 100   < > 104   < > 105   < >   < >  --  105   < > 104 104 106 104 105 106 102  CO2 20*   < > 22   < > 19*   < >   < >  --  22   < > 21* 21* 21* 20* 20* 20* 24  GLUCOSE 194*   < > 157*   < > 140*   < >   < >  --  210*   < > 222* 260* 279* 299* 309* 313* 215*  BUN 16    < > 18   < > 23   < >   < >  --  32*   < > 35* 36* 38* 41* 43* 45* 41*  CREATININE 2.48*   < > 2.62*   < > 2.87*   < >   < >  --  3.42*   < > 3.62* 3.71* 3.87* 4.03* 4.21* 4.25* 3.33*  CALCIUM 6.8*   < > 7.2*   < > 7.5*   < >   < >  --  7.4*   < > 7.7* 7.5* 7.5* 7.5* 7.4* 7.4* 7.2*  PHOS 1.6*  --  4.0  --  4.9*  --   --  6.9* 7.2*  --   --   --   --   --   --  7.3* 3.1   < > = values in this interval not displayed.   CBC Recent Labs  Lab 12/28/2019 2007 12/31/2019 2015 12/31/19 1758 01/01/20 0401  01/01/20 0432 01/01/20 1029 01/02/20 0355 01/03/20 0350  WBC 12.3*   < > 4.6  --  7.4  --  16.6* 15.9*  NEUTROABS 8.5*  --  3.2  --   --   --   --  14.1*  HGB 7.1*   < > 7.8*   < > 9.0* 8.5* 8.5* 9.2*  HCT 23.6*   < > 23.7*   < > 27.5* 25.0* 27.0* 28.2*  MCV 97.5   < > 89.8  --  90.5  --  94.7 91.9  PLT 213   < > 155  --  175  --  332 240   < > = values in this interval not displayed.      Medications:    . aspirin  81 mg Per Tube Daily  . chlorhexidine gluconate (MEDLINE KIT)  15 mL Mouth Rinse BID  . Chlorhexidine Gluconate Cloth  6 each Topical Daily  . Chlorhexidine Gluconate Cloth  6 each Topical Q0600  . dexamethasone (DECADRON) injection  6 mg Intravenous Q24H  . feeding supplement (PRO-STAT SUGAR FREE 64)  30 mL Per Tube BID  . heparin  7,500 Units Subcutaneous Q8H  . insulin aspart  0-24 Units Subcutaneous Q4H  . insulin aspart  4 Units Subcutaneous Q4H  . insulin glargine  10 Units Subcutaneous BID  . mouth rinse  15 mL Mouth Rinse 10 times per day  . multivitamin  1 tablet Per Tube QHS  . pantoprazole (PROTONIX) IV  40 mg Intravenous QHS   Elmarie Shiley, MD 01/03/2020, 8:06 AM

## 2020-01-03 NOTE — Progress Notes (Signed)
NAME:  Patricia [name], MRN:  [id number], DOB:  11-25-1955, LOS: 4 ADMISSION DATE:  12/29/2019, CONSULTATION DATE:  01/03/20 REFERRING MD:  Regenia Skeeter CHIEF COMPLAINT:  Cardiac Arrest   Brief History   64 y.o. F with PMH ESRD, anemia, HFrEF, Depression, Type 2 DM, HTN and obesity who presented in cardiac arrest.  Pt was in her usual state of health and went to dialysis today where she became nauseated and vomited, at home she had a syncopal episode and collapse. CPR initiated and ROSC after 16mins.    History of present illness   Patricia [name] is a 64 y.o. F with PMH of  ESRD, anemia, HFrEF, Depression, Type 2 DM, HTN and obesity who presented in cardiac arrest. She lives with family and they state she has been in her usual state of health and went to dialysis today where she had an episode of nausea and vomiting and returned home.  There she became sweaty and collapsed, family started CPR immediately and called EMS who performed 15 minutes of CPR with ROSC, no mention of shockable rhythm.    In the ED, pt was not initially responsive and was intubated.  CT head was without acute findings, patient was hypertensive and febrile with a potassium of 2.8 and prolonged QTC.  No sign of STEMI on EKG.  Lactic acid was elevated and chest x-ray showed vascular congestion.  She was given 2 g of magnesium, broad-spectrum antibiotics and IV fluids.  Covid-19 resulted positive.  PCCM consulted for admission.  Family states that she has showed no symptoms of Covid, and they are unsure how she may have contracted this.  Past Medical History   has a past medical history of Allergy, Anemia, Anxiety, Blood transfusion without reported diagnosis, Cardiomyopathy ([place]) (11/2016), CHF (congestive heart failure) ([place]), CKD (chronic kidney disease), stage IV ([place]), Depression, Diabetes mellitus without complication ([place]), Diverticulitis, Dyspnea, Hypertension, and Obesity.   Significant Hospital Events   3/20 admit to  PCCM 3/21 TTM initiated to 33 3/22 completed TTM, rewarmed  Consults:  Nephrology  Procedures:  3/20 ET tube  Significant Diagnostic Tests:  3/20 CT head>> no acute findings 3/21 EEG>> generalized background slowing, no epileptiform activity 3/21 echocardiogram-LVEF 30 to 35%, no regional wall motion abnormalities, moderate LVH.  Grade 1 diastolic dysfunction.  Mildly dilated LA, normal RV. 3/22 EEG-generalized background attenuation with generalized slowing c/w profound diffuse encephalopathy  Micro Data:  3/20 SARS-Cov-2>> positive 3/22 sputum- GNRs  Antimicrobials:  Cefepime 3/20- Flagyl 3/20 only Vancomycin 3/20- Zosyn 3/24>>  Interim history/subjective:  Opening her eyes this morning but not tracking or following commands.  Objective   Blood pressure (!) 111/56, pulse (!) 102, temperature 99 F (37.2 C), temperature source Oral, resp. rate 11, height 5\' 4"  (1.626 m), weight 67.7 kg, SpO2 100 %. CVP:  [0 mmHg-75 mmHg] 3 mmHg  Vent Mode: PSV FiO2 (%):  [30 %] 30 % Set Rate:  [8 bmp] 8 bmp Vt Set:  [430 mL] 430 mL PEEP:  [5 cmH20] 5 cmH20 Pressure Support:  [10 cmH20] 10 cmH20 Plateau Pressure:  [10 cmH20-15 cmH20] 15 cmH20   Intake/Output Summary (Last 24 hours) at 01/03/2020 1037 Last data filed at 01/03/2020 1000 Gross per 24 hour  Intake 1142.47 ml  Output 50 ml  Net 1092.47 ml   Filed Weights   01/02/20 0646 01/02/20 1030 01/03/20 0423  Weight: 69.8 kg 67.8 kg 67.7 kg    General: Critically ill-appearing woman intubated, not sedated HEENT: [place]/AT, eyes  anicteric.  Oral mucosa moist, ETT in place. Cardio: Tachycardic, regular rhythm Respiratory: Breathing above the set vent rate, synchronous with ventilator.  No significant ETT secretions. CTAB. Abdomen: Soft, nontender, nondistended Extremities: No clubbing, cyanosis, edema.  Right radial A-line with good distal perfusion.  Derm: No rashes or wounds Neuro: Opens her eyes spontaneously and with verbal  stimulation.  Not tracking.  Not following commands.  Not withdrawing in any extremity to noxious stimuli.  Positive tracheal gag, no pharyngeal gag.  Biting on the ET tube.  Positive pupil, corneal, oculocephalic reflexes.  Normal Babinski.  CXR 3/23 personally reviewed- left basilar infiltrate silhouetting the left hemidiaphragm  Resolved Hospital Problem list     Assessment & Plan:   Acute cardiopulmonary arrest -Possibly related to electrolyte disturbance prolonged QTC worsening heart failure, no signs of acute ischemia on EKG and troponin mildly elevated, although echocardiogram suggests against acute MI. -ROSC obtained after 15 minutes of CPR -CT head without acute findings P: -Continue daily aspirin -Continue to monitor electrolytes and QTc . -Continue [place] sun to maintain normothermia. -Avoiding all sedation for neuro prognostication -Continue vent support -Head of bed greater than 30 degrees -Maintain normoglycemia -Avoid hyperoxia and hypercapnia  Acute respiratory failure requiring mechanical ventilation post cardiac arrest. LLL pneumonia. -Continue low tidal volume ventilation, 4 to 8 cc/kg ideal body weight with goal plateau less than thirty and driving pressure less than fifteen. -Daily SAT and SBT -VAP prevention protocol -Starting Zosyn for 7 day course for likely aspiration pneumonia  Anoxic encephalopathy -Continue to avoid all sedation. -Continue TTM to avoid fevers -Continue supportive care-head of bed greater than 30 degrees, optimize electrolytes  Covid-19 infection -Patient with low-grade fever on arrival, no URI symptoms per family unclear if contributory at all to today's events P: -Continue dexamethasone and remdesivir per protocol  HFrEF, hypertension.  Acutely worsened systolic function post-arrest. Bradycardia resolved post- rewarming. -Echocardiogram 1 year ago with EF of 40 to 45% -She has recently been taken off all home antihypertensive  medication except hydralazine due to borderline low blood pressure -Follows with Dr. Sallyanne Kuster P: -Continue supportive care -Restarting PTA Coreg at reduced dose -Continue hydralazine as needed for SBP >170 sustained  Hyperglycemia, uncontrolled. DM2 with a1c 7.4 -Lantus increased to 20 units twice daily -NovoLog 6 units every 4 hours  -Sliding scale insulin as needed -Continue Accu-Cheks every 4 hours -Goal BG 140-180 while mated to the ICU  F/E/N: P: -Continue tube feeds -Continue to monitor electrolytes  Hypokalemia- resolved Unlikely the sole cause for prolonged QTC given it's persistence after correction of electrolytes. PTA on phenergan. -Qtc 651; 438ms on 3/24 -Per cardiology note she has a history of prolonged QT interval, usually 480-510 ms -Last stress test in 2018 did not show evidence of ischemia she does not have a history of ventricular arrhythmia P: -Continue to avoid all QTC prolonging meds -Serial QTC monitoring  ESRD -T/TH/sat schedule -Per family it sounds like she completed most of her dialysis 3/20 P: -Appreciate nephrology's assistance.  Continue to monitor electrolytes   CXR personally reviewed- RLL infiltrate   Best practice:  Diet: TF Pain/Anxiety/Delirium protocol (if indicated): Fentanyl propofol VAP protocol (if indicated): HOB 30 degrees, daily SBT as sedation will allow DVT prophylaxis: Heparin GI prophylaxis: Protonix Glucose control: SSI Mobility: Bedrest Code Status: Full code   Family Communication: updated daughter Almon Register  Disposition: ICU  Labs   CBC: Recent Labs  Lab 12/19/2019 2007 12/12/2019 2015 12/31/19 0433 12/31/19 0439 12/31/19 1758 12/31/19 1758 01/01/20  0401 01/01/20 0432 01/01/20 1029 01/02/20 0355 01/03/20 0350  WBC 12.3*   < > 6.8  --  4.6  --   --  7.4  --  16.6* 15.9*  NEUTROABS 8.5*  --   --   --  3.2  --   --   --   --   --  14.1*  HGB 7.1*   < > 7.9*   < > 7.8*   < > 8.5* 9.0* 8.5* 8.5* 9.2*  HCT 23.6*    < > 24.0*   < > 23.7*   < > 25.0* 27.5* 25.0* 27.0* 28.2*  MCV 97.5   < > 89.6  --  89.8  --   --  90.5  --  94.7 91.9  PLT 213   < > 167  --  155  --   --  175  --  332 240   < > = values in this interval not displayed.    Basic Metabolic Panel: Recent Labs  Lab 01/01/20 0202 01/01/20 0401 01/01/20 1132 01/01/20 1342 01/01/20 1626 01/01/20 1810 01/01/20 2200 01/02/20 0000 01/02/20 0200 01/02/20 0355 01/03/20 0350  NA 136   < >   < >  --  136   < > 137 136 137 136 138  K 4.5   < >   < >  --  5.1   < > 5.3* 5.6* 5.5* 5.4* 3.8  CL 105   < >   < >  --  105   < > 106 104 105 106 102  CO2 19*   < >   < >  --  22   < > 21* 20* 20* 20* 24  GLUCOSE 140*   < >   < >  --  210*   < > 279* 299* 309* 313* 215*  BUN 23   < >   < >  --  32*   < > 38* 41* 43* 45* 41*  CREATININE 2.87*   < >   < >  --  3.42*   < > 3.87* 4.03* 4.21* 4.25* 3.33*  CALCIUM 7.5*   < >   < >  --  7.4*   < > 7.5* 7.5* 7.4* 7.4* 7.2*  MG 1.9  --   --  2.0 2.0  --   --   --   --  2.1 1.9  PHOS 4.9*  --   --  6.9* 7.2*  --   --   --   --  7.3* 3.1   < > = values in this interval not displayed.   GFR: Estimated Creatinine Clearance: 16.8 mL/min (A) (by C-G formula based on SCr of 3.33 mg/dL (H)). Recent Labs  Lab 01/05/2020 2007 01/08/2020 2007 12/27/2019 2243 12/31/19 0230 12/31/19 0433 12/31/19 1758 01/01/20 0202 01/01/20 0432 01/02/20 0355 01/03/20 0350  PROCALCITON  --   --   --  9.78  --   --  24.23  --  21.64  --   WBC 12.3*   < >  --  8.4   < > 4.6  --  7.4 16.6* 15.9*  LATICACIDVEN 6.2*  --  1.6  --   --   --   --   --   --   --    < > = values in this interval not displayed.    Liver Function Tests: Recent Labs  Lab 12/21/2019 2007 12/31/19 1353 01/01/20 0202 01/02/20 0355  AST 105* 53* 50* 40  ALT 45* 30 33 29  ALKPHOS 70 52 51 61  BILITOT 0.5 0.6 0.5 0.4  PROT 5.4* 4.3* 4.5* 4.8*  ALBUMIN 1.8* 1.3* 1.3* 1.5*   No results for input(s): LIPASE, AMYLASE in the last 168 hours. No results for  input(s): AMMONIA in the last 168 hours.  ABG    Component Value Date/Time   PHART 7.339 (L) 01/01/2020 1029   PCO2ART 40.4 01/01/2020 1029   PO2ART 99.0 01/01/2020 1029   HCO3 21.7 01/01/2020 1029   TCO2 23 01/01/2020 1029   ACIDBASEDEF 4.0 (H) 01/01/2020 1029   O2SAT 97.0 01/01/2020 1029     Coagulation Profile: Recent Labs  Lab 12/19/2019 2007 12/31/19 0433 12/31/19 1140 01/01/20 0432  INR 1.1 1.1 1.2 1.1    Cardiac Enzymes: No results for input(s): CKTOTAL, CKMB, CKMBINDEX, TROPONINI in the last 168 hours.  HbA1C: HbA1c, POC (prediabetic range)  Date/Time Value Ref Range Status  10/18/2018 10:39 AM 6.0 5.7 - 6.4 % Final   Hgb A1c MFr Bld  Date/Time Value Ref Range Status  11/20/2019 01:00 PM 7.4 (H) 4.8 - 5.6 % Final    Comment:    (NOTE)         Prediabetes: 5.7 - 6.4         Diabetes: >6.4         Glycemic control for adults with diabetes: <7.0   06/16/2019 09:20 PM 7.0 (H) 4.8 - 5.6 % Final    Comment:    (NOTE) Pre diabetes:          5.7%-6.4% Diabetes:              >6.4% Glycemic control for   <7.0% adults with diabetes     CBG: Recent Labs  Lab 01/02/20 1644 01/02/20 2102 01/02/20 2359 01/03/20 0354 01/03/20 0724  GLUCAP 288* 216* 184* 197* 264*     This patient is critically ill with multiple organ system failure which requires frequent high complexity decision making, assessment, support, evaluation, and titration of therapies. This was completed through the application of advanced monitoring technologies and extensive interpretation of multiple databases. During this encounter critical care time was devoted to patient care services described in this note for 40 minutes.  Julian Hy, DO 01/03/20 10:37 AM [facility]

## 2020-01-04 DIAGNOSIS — J156 Pneumonia due to other aerobic Gram-negative bacteria: Secondary | ICD-10-CM

## 2020-01-04 DIAGNOSIS — I10 Essential (primary) hypertension: Secondary | ICD-10-CM

## 2020-01-04 LAB — CBC
HCT: 27.1 % — ABNORMAL LOW (ref 36.0–46.0)
Hemoglobin: 8.8 g/dL — ABNORMAL LOW (ref 12.0–15.0)
MCH: 29 pg (ref 26.0–34.0)
MCHC: 32.5 g/dL (ref 30.0–36.0)
MCV: 89.4 fL (ref 80.0–100.0)
Platelets: 257 10*3/uL (ref 150–400)
RBC: 3.03 MIL/uL — ABNORMAL LOW (ref 3.87–5.11)
RDW: 16.4 % — ABNORMAL HIGH (ref 11.5–15.5)
WBC: 17 10*3/uL — ABNORMAL HIGH (ref 4.0–10.5)
nRBC: 0.3 % — ABNORMAL HIGH (ref 0.0–0.2)

## 2020-01-04 LAB — CULTURE, RESPIRATORY W GRAM STAIN

## 2020-01-04 LAB — RENAL FUNCTION PANEL
Albumin: 1.2 g/dL — ABNORMAL LOW (ref 3.5–5.0)
Anion gap: 12 (ref 5–15)
BUN: 68 mg/dL — ABNORMAL HIGH (ref 8–23)
CO2: 25 mmol/L (ref 22–32)
Calcium: 7.7 mg/dL — ABNORMAL LOW (ref 8.9–10.3)
Chloride: 100 mmol/L (ref 98–111)
Creatinine, Ser: 4.38 mg/dL — ABNORMAL HIGH (ref 0.44–1.00)
GFR calc Af Amer: 12 mL/min — ABNORMAL LOW (ref >60)
GFR calc non Af Amer: 10 mL/min — ABNORMAL LOW (ref >60)
Glucose, Bld: 81 mg/dL (ref 70–99)
Phosphorus: 2.7 mg/dL (ref 2.5–4.6)
Potassium: 4.1 mmol/L (ref 3.5–5.1)
Sodium: 137 mmol/L (ref 135–145)

## 2020-01-04 LAB — CULTURE, BLOOD (ROUTINE X 2)
Culture: NO GROWTH
Culture: NO GROWTH

## 2020-01-04 LAB — GLUCOSE, CAPILLARY
Glucose-Capillary: 126 mg/dL — ABNORMAL HIGH (ref 70–99)
Glucose-Capillary: 141 mg/dL — ABNORMAL HIGH (ref 70–99)
Glucose-Capillary: 148 mg/dL — ABNORMAL HIGH (ref 70–99)
Glucose-Capillary: 151 mg/dL — ABNORMAL HIGH (ref 70–99)
Glucose-Capillary: 167 mg/dL — ABNORMAL HIGH (ref 70–99)
Glucose-Capillary: 79 mg/dL (ref 70–99)

## 2020-01-04 LAB — APTT
aPTT: 198 seconds (ref 24–36)
aPTT: >200 seconds (ref 24–36)

## 2020-01-04 MED ORDER — FENTANYL CITRATE (PF) 100 MCG/2ML IJ SOLN
25.0000 ug | INTRAMUSCULAR | Status: DC | PRN
  Administered 2020-01-04 – 2020-01-05 (×7): 100 ug via INTRAVENOUS
  Administered 2020-01-10 (×2): 50 ug via INTRAVENOUS
  Administered 2020-01-10: 18:00:00 100 ug via INTRAVENOUS
  Filled 2020-01-04 (×10): qty 2

## 2020-01-04 MED ORDER — HEPARIN SODIUM (PORCINE) 5000 UNIT/ML IJ SOLN
5000.0000 [IU] | Freq: Three times a day (TID) | INTRAMUSCULAR | Status: DC
  Administered 2020-01-04 – 2020-01-06 (×6): 5000 [IU] via SUBCUTANEOUS
  Filled 2020-01-04 (×6): qty 1

## 2020-01-04 MED ORDER — HEPARIN SODIUM (PORCINE) 1000 UNIT/ML DIALYSIS
40.0000 [IU]/kg | INTRAMUSCULAR | Status: DC | PRN
  Administered 2020-01-04: 3200 [IU] via INTRAVENOUS_CENTRAL
  Administered 2020-01-06 – 2020-01-10 (×2): 2700 [IU] via INTRAVENOUS_CENTRAL
  Filled 2020-01-04 (×3): qty 3

## 2020-01-04 MED ORDER — CARVEDILOL 3.125 MG PO TABS
3.1250 mg | ORAL_TABLET | Freq: Two times a day (BID) | ORAL | Status: DC
  Administered 2020-01-04: 3.125 mg via ORAL
  Filled 2020-01-04: qty 1

## 2020-01-04 MED ORDER — FENTANYL CITRATE (PF) 100 MCG/2ML IJ SOLN: 25.0000 ug | INTRAMUSCULAR | Status: DC | PRN

## 2020-01-04 NOTE — Progress Notes (Signed)
NAME:  KIMBELLA HEISLER, MRN:  [id number], DOB:  [birth year], LOS: 5 ADMISSION DATE:  12/24/2019, CONSULTATION DATE:  01/04/20 REFERRING MD:  Regenia Skeeter CHIEF COMPLAINT:  Cardiac Arrest   Brief History   64 y.o. F with PMH ESRD, anemia, HFrEF, Depression, Type 2 DM, HTN and obesity who presented in cardiac arrest.  Pt was in her usual state of health and went to dialysis today where she became nauseated and vomited, at home she had a syncopal episode and collapse. CPR initiated and ROSC after 46mins.    History of present illness   Mariann Palo is a 64 y.o. F with PMH of  ESRD, anemia, HFrEF, Depression, Type 2 DM, HTN and obesity who presented in cardiac arrest. She lives with family and they state she has been in her usual state of health and went to dialysis today where she had an episode of nausea and vomiting and returned home.  There she became sweaty and collapsed, family started CPR immediately and called EMS who performed 15 minutes of CPR with ROSC, no mention of shockable rhythm.    In the ED, pt was not initially responsive and was intubated.  CT head was without acute findings, patient was hypertensive and febrile with a potassium of 2.8 and prolonged QTC.  No sign of STEMI on EKG.  Lactic acid was elevated and chest x-ray showed vascular congestion.  She was given 2 g of magnesium, broad-spectrum antibiotics and IV fluids.  Covid-19 resulted positive.  PCCM consulted for admission.  Family states that she has showed no symptoms of Covid, and they are unsure how she may have contracted this.  Past Medical History   has a past medical history of Allergy, Anemia, Anxiety, Blood transfusion without reported diagnosis, Cardiomyopathy ([place]) (11/2016), CHF (congestive heart failure) ([place]), CKD (chronic kidney disease), stage IV ([place]), Depression, Diabetes mellitus without complication ([place]), Diverticulitis, Dyspnea, Hypertension, and Obesity.   Significant Hospital Events   3/20 admit to  PCCM 3/21 TTM initiated to 33 3/22 completed TTM, rewarmed  Consults:  Nephrology  Procedures:  3/20 ET tube  Significant Diagnostic Tests:  3/20 CT head>> no acute findings 3/21 EEG>> generalized background slowing, no epileptiform activity 3/21 echocardiogram-LVEF 30 to 35%, no regional wall motion abnormalities, moderate LVH.  Grade 1 diastolic dysfunction.  Mildly dilated LA, normal RV. 3/22 EEG-generalized background attenuation with generalized slowing c/w profound diffuse encephalopathy  Micro Data:  3/20 SARS-Cov-2>> positive 3/22 sputum- GNRs  Antimicrobials:  Cefepime 3/20- Flagyl 3/20 only Vancomycin 3/20-3/22 Zosyn 3/24>>  Interim history/subjective:  No change in neuro status today. Still occasionally opening her eyes, but not tracking.  Objective   Blood pressure (!) 112/55, pulse 96, temperature 97.8 F (36.6 C), temperature source Oral, resp. rate 16, height 5\' 4"  (1.626 m), weight 71.7 kg, SpO2 100 %. CVP:  [2 mmHg] 2 mmHg  Vent Mode: PRVC FiO2 (%):  [30 %] 30 % Set Rate:  [8 bmp] 8 bmp Vt Set:  [430 mL] 430 mL PEEP:  [5 cmH20] 5 cmH20 Pressure Support:  [10 cmH20] 10 cmH20 Plateau Pressure:  [10 cmH20-12 cmH20] 10 cmH20   Intake/Output Summary (Last 24 hours) at 01/04/2020 1040 Last data filed at 01/04/2020 0800 Gross per 24 hour  Intake 976.53 ml  Output --  Net 976.53 ml   Filed Weights   01/02/20 1030 01/03/20 0423 01/04/20 0630  Weight: 67.8 kg 67.7 kg 71.7 kg    General: Critically not sedated not sedated HEENT: [place]/AT, eyes anicteric.  Oral mucosa moist, ETT in place. Cardio: Regular rate and rhythm, no murmurs Respiratory: Breathing comfortably on the vent-pressure support 8+ PEEP 5.  Mild thick tan secretions from ET tube.  Rhonchi cleared with suctioning. Abdomen: Soft, nontender, nondistended Extremities: No clubbing, cyanosis, edema.  Right radial A-line good distal perfusion Derm: No rashes or wounds.  Bandage over right IJ port  wine was removed. Neuro: Opening her eyes spontaneously and intermittently to verbal stimulation, tracking.  Not withdrawing to pain, but moving left toes to nailbed pressure.  Not following commands. PERRL, intact corneal and oculocephalic reflexes. + Tracheal gag.  Intermittently bites on ET tube.    Resolved Hospital Problem list     Assessment & Plan:   Acute cardiopulmonary arrest -Possibly related to electrolyte disturbance prolonged QTC worsening heart failure, no signs of acute ischemia on EKG and troponin mildly elevated, although echocardiogram suggests against acute MI. -ROSC obtained after 15 minutes of CPR -CT head without acute findings P: -Continue daily aspirin -Continue to monitor electrolytes.  QTc improved. -Continue to maintain normothermia, euglycemia, head of bed greater than 30 degrees -Continue vent support -Avoid hypoxia and hypocapnea  Acute respiratory failure requiring mechanical ventilation post cardiac arrest. LLL pneumonia due to Enterobacter likely from aspiration during cardiac arrest. -Continue low tidal volume ventilation, 4 to 8 cc/kg ideal body weight with goal plateau less than thirty and driving pressure less than fifteen. -Continue daily SBT.  Minimize sedation as much as possible.  Daily SAT. -CAP prevention protocol -Complete 7-day course of Zosyn for aspiration pneumonia  Anoxic encephalopathy -Completed TTM to 33 degrees -Continue to avoid all sedation.  As needed fentanyl for pain control -Continue supportive care-avoid fevers, hypercapnia, hyperoxia.  Maintain normal electrolytes and normoglycemia.  Covid-19 infection -Patient with low-grade fever on arrival, no URI symptoms per family unclear if contributory at all to today's events P: -Continue dexamethasone and remdesivir per protocol  HFrEF, hypertension.  Acutely worsened systolic function post-arrest. Bradycardia resolved post- rewarming. -Echocardiogram 1 year ago with EF of  40 to 45% -She has recently been taken off all home antihypertensive medication except hydralazine due to borderline low blood pressure -Follows with Dr. Sallyanne Kuster P: -Continue supportive care -Decrease dose of Coreg 3.125mg  BID given hypotension during dialysis -Continue hydralazine as needed for SBP greater than 170  Hyperglycemia, now controlled. DM2 with a1c 7.4 -Continue Lantus 20 units twice daily -Continue NovoLog 8 units every 4 hours per tube feeding coverage -Accu-Cheks with sliding scale insulin as needed -Goal BG 140-180 while admitted to the ICU  F/E/N: P: -Continue tube feeds -Continue to monitor electrolytes  Hypokalemia- resolved Unlikely the sole cause for prolonged QTC given it's persistence after correction of electrolytes. PTA on phenergan. -Qtc 651; 442ms on 3/24 -Per cardiology note she has a history of prolonged QT interval, usually 480-510 ms -Last stress test in 2018 did not show evidence of ischemia she does not have a history of ventricular arrhythmia P: -Continue avoiding all QTC prolonging meds  ESRD -T/TH/sat schedule -Per family it sounds like she completed most of her dialysis 3/20 P: -Appreciate nephrology's assistance.  iHD this morning.  Required low-dose Levophed to tolerate completion of treatment.     Best practice:  Diet: TF Pain/Anxiety/Delirium protocol (if indicated): Fentanyl propofol VAP protocol (if indicated): HOB 30 degrees, daily SBT as sedation will allow DVT prophylaxis: Heparin GI prophylaxis: Protonix Glucose control: SSI Mobility: Bedrest Code Status: Full code   Family Communication: daughter Almon Register updated Disposition: ICU  Labs  CBC: Recent Labs  Lab 12/29/2019 2007 12/11/2019 2015 12/31/19 1758 01/01/20 0401 01/01/20 0432 01/01/20 1029 01/02/20 0355 01/03/20 0350 01/04/20 0729  WBC 12.3*   < > 4.6  --  7.4  --  16.6* 15.9* 17.0*  NEUTROABS 8.5*  --  3.2  --   --   --   --  14.1*  --   HGB 7.1*   < >  7.8*   < > 9.0* 8.5* 8.5* 9.2* 8.8*  HCT 23.6*   < > 23.7*   < > 27.5* 25.0* 27.0* 28.2* 27.1*  MCV 97.5   < > 89.8  --  90.5  --  94.7 91.9 89.4  PLT 213   < > 155  --  175  --  332 240 257   < > = values in this interval not displayed.    Basic Metabolic Panel: Recent Labs  Lab 01/01/20 0202 01/01/20 0401 01/01/20 1132 01/01/20 1342 01/01/20 1626 01/01/20 1810 01/02/20 0000 01/02/20 0200 01/02/20 0355 01/03/20 0350 01/04/20 0702  NA 136   < >   < >  --  136   < > 136 137 136 138 137  K 4.5   < >   < >  --  5.1   < > 5.6* 5.5* 5.4* 3.8 4.1  CL 105   < >   < >  --  105   < > 104 105 106 102 100  CO2 19*   < >   < >  --  22   < > 20* 20* 20* 24 25  GLUCOSE 140*   < >   < >  --  210*   < > 299* 309* 313* 215* 81  BUN 23   < >   < >  --  32*   < > 41* 43* 45* 41* 68*  CREATININE 2.87*   < >   < >  --  3.42*   < > 4.03* 4.21* 4.25* 3.33* 4.38*  CALCIUM 7.5*   < >   < >  --  7.4*   < > 7.5* 7.4* 7.4* 7.2* 7.7*  MG 1.9  --   --  2.0 2.0  --   --   --  2.1 1.9  --   PHOS 4.9*   < >  --  6.9* 7.2*  --   --   --  7.3* 3.1 2.7   < > = values in this interval not displayed.   GFR: Estimated Creatinine Clearance: 13.1 mL/min (A) (by C-G formula based on SCr of 4.38 mg/dL (H)). Recent Labs  Lab 12/16/2019 2007 12/29/2019 2007 12/12/2019 2243 12/31/19 0230 12/31/19 0433 12/31/19 1758 01/01/20 0202 01/01/20 0432 01/02/20 0355 01/03/20 0350 01/04/20 0729  PROCALCITON  --   --   --  9.78  --   --  24.23  --  21.64  --   --   WBC 12.3*   < >  --  8.4   < >   < >  --  7.4 16.6* 15.9* 17.0*  LATICACIDVEN 6.2*  --  1.6  --   --   --   --   --   --   --   --    < > = values in this interval not displayed.    Liver Function Tests: Recent Labs  Lab 12/13/2019 2007 12/31/19 1353 01/01/20 0202 01/02/20 0355 01/04/20 0702  AST 105* 53* 50* 40  --  ALT 45* 30 33 29  --   ALKPHOS 70 52 51 61  --   BILITOT 0.5 0.6 0.5 0.4  --   PROT 5.4* 4.3* 4.5* 4.8*  --   ALBUMIN 1.8* 1.3* 1.3* 1.5*  1.2*   No results for input(s): LIPASE, AMYLASE in the last 168 hours. No results for input(s): AMMONIA in the last 168 hours.  ABG    Component Value Date/Time   PHART 7.339 (L) 01/01/2020 1029   PCO2ART 40.4 01/01/2020 1029   PO2ART 99.0 01/01/2020 1029   HCO3 21.7 01/01/2020 1029   TCO2 23 01/01/2020 1029   ACIDBASEDEF 4.0 (H) 01/01/2020 1029   O2SAT 97.0 01/01/2020 1029     Coagulation Profile: Recent Labs  Lab 01/04/2020 2007 12/31/19 0433 12/31/19 1140 01/01/20 0432  INR 1.1 1.1 1.2 1.1    Cardiac Enzymes: No results for input(s): CKTOTAL, CKMB, CKMBINDEX, TROPONINI in the last 168 hours.  HbA1C: HbA1c, POC (prediabetic range)  Date/Time Value Ref Range Status  10/18/2018 10:39 AM 6.0 5.7 - 6.4 % Final   Hgb A1c MFr Bld  Date/Time Value Ref Range Status  11/20/2019 01:00 PM 7.4 (H) 4.8 - 5.6 % Final    Comment:    (NOTE)         Prediabetes: 5.7 - 6.4         Diabetes: >6.4         Glycemic control for adults with diabetes: <7.0   06/16/2019 09:20 PM 7.0 (H) 4.8 - 5.6 % Final    Comment:    (NOTE) Pre diabetes:          5.7%-6.4% Diabetes:              >6.4% Glycemic control for   <7.0% adults with diabetes     CBG: Recent Labs  Lab 01/03/20 1525 01/03/20 1915 01/03/20 2331 01/04/20 0308 01/04/20 0723  GLUCAP 233* 179* 190* 167* 79    This patient is critically ill with multiple organ system failure which requires frequent high complexity decision making, assessment, support, evaluation, and titration of therapies. This was completed through the application of advanced monitoring technologies and extensive interpretation of multiple databases. During this encounter critical care time was devoted to patient care services described in this note for 34 minutes.   Julian Hy, DO 01/04/20 10:40 AM [facility]

## 2020-01-04 NOTE — Progress Notes (Signed)
Assisted tele visit to patient with daughter.  Gage Treiber Samson, RN  

## 2020-01-04 NOTE — Progress Notes (Signed)
CRITICAL VALUE ALERT  Critical Value:  PTT 198  Date & Time Notied:  01/04/2020 0630  Provider Notified: Warren Lacy MD

## 2020-01-04 NOTE — Procedures (Signed)
I was present at this dialysis session. I have reviewed the session itself and made appropriate changes.   Goal UF limited by hypotension req some use of PE gtt.  3K bath.  Looks like will achieve 1L UF goal.    Occ opens eyes spontaneously or to voice.  Minimal vent settings.    Next HD 3/27.   Filed Weights   01/02/20 1030 01/03/20 0423 01/04/20 0630  Weight: 67.8 kg 67.7 kg 71.7 kg    Recent Labs  Lab 01/04/20 0702  NA 137  K 4.1  CL 100  CO2 25  GLUCOSE 81  BUN 68*  CREATININE 4.38*  CALCIUM 7.7*  PHOS 2.7    Recent Labs  Lab 01/02/2020 2007 01/10/2020 2015 12/31/19 1758 01/01/20 0401 01/02/20 0355 01/03/20 0350 01/04/20 0729  WBC 12.3*   < > 4.6   < > 16.6* 15.9* 17.0*  NEUTROABS 8.5*  --  3.2  --   --  14.1*  --   HGB 7.1*   < > 7.8*   < > 8.5* 9.2* 8.8*  HCT 23.6*   < > 23.7*   < > 27.0* 28.2* 27.1*  MCV 97.5   < > 89.8   < > 94.7 91.9 89.4  PLT 213   < > 155   < > 332 240 257   < > = values in this interval not displayed.    Scheduled Meds: . aspirin  81 mg Per Tube Daily  . carvedilol  3.125 mg Oral BID WC  . chlorhexidine gluconate (MEDLINE KIT)  15 mL Mouth Rinse BID  . Chlorhexidine Gluconate Cloth  6 each Topical Daily  . Chlorhexidine Gluconate Cloth  6 each Topical Q0600  . dexamethasone (DECADRON) injection  6 mg Intravenous Q24H  . feeding supplement (PRO-STAT SUGAR FREE 64)  30 mL Per Tube BID  . heparin injection (subcutaneous)  5,000 Units Subcutaneous Q8H  . insulin aspart  0-24 Units Subcutaneous Q4H  . insulin aspart  8 Units Subcutaneous Q4H  . insulin glargine  20 Units Subcutaneous BID  . mouth rinse  15 mL Mouth Rinse 10 times per day  . multivitamin  1 tablet Per Tube QHS  . pantoprazole (PROTONIX) IV  40 mg Intravenous QHS   Continuous Infusions: . feeding supplement (VITAL 1.5 CAL) 40 mL/hr at 01/04/20 0800  . norepinephrine (LEVOPHED) Adult infusion 8 mcg/min (01/04/20 0854)  . piperacillin-tazobactam (ZOSYN)  IV Stopped  (01/04/20 0200)   PRN Meds:.fentaNYL (SUBLIMAZE) injection, fentaNYL (SUBLIMAZE) injection, heparin, hydrALAZINE   Pearson Grippe  MD 01/04/2020, 10:20 AM

## 2020-01-04 NOTE — Progress Notes (Signed)
[facility] Progress Note Patient Name: Patricia [name] DOB: [birth year] MRN: [id number]   Date of Service  01/04/2020  HPI/Events of Note  aPTT  198  eICU Interventions  Order to repeat test to validate result entered.        Kerry Kass Kynadee Dam 01/04/2020, 6:52 AM

## 2020-01-05 ENCOUNTER — Inpatient Hospital Stay (HOSPITAL_COMMUNITY): Payer: Medicare HMO

## 2020-01-05 DIAGNOSIS — U071 COVID-19: Secondary | ICD-10-CM

## 2020-01-05 DIAGNOSIS — E1122 Type 2 diabetes mellitus with diabetic chronic kidney disease: Secondary | ICD-10-CM

## 2020-01-05 DIAGNOSIS — Z992 Dependence on renal dialysis: Secondary | ICD-10-CM

## 2020-01-05 LAB — COMPREHENSIVE METABOLIC PANEL
ALT: 21 U/L (ref 0–44)
AST: 29 U/L (ref 15–41)
Albumin: 1.3 g/dL — ABNORMAL LOW (ref 3.5–5.0)
Alkaline Phosphatase: 82 U/L (ref 38–126)
Anion gap: 14 (ref 5–15)
BUN: 50 mg/dL — ABNORMAL HIGH (ref 8–23)
CO2: 25 mmol/L (ref 22–32)
Calcium: 7.9 mg/dL — ABNORMAL LOW (ref 8.9–10.3)
Chloride: 97 mmol/L — ABNORMAL LOW (ref 98–111)
Creatinine, Ser: 3.47 mg/dL — ABNORMAL HIGH (ref 0.44–1.00)
GFR calc Af Amer: 16 mL/min — ABNORMAL LOW (ref >60)
GFR calc non Af Amer: 14 mL/min — ABNORMAL LOW (ref >60)
Glucose, Bld: 122 mg/dL — ABNORMAL HIGH (ref 70–99)
Potassium: 4.6 mmol/L (ref 3.5–5.1)
Sodium: 136 mmol/L (ref 135–145)
Total Bilirubin: 0.7 mg/dL (ref 0.3–1.2)
Total Protein: 5.1 g/dL — ABNORMAL LOW (ref 6.5–8.1)

## 2020-01-05 LAB — GLUCOSE, CAPILLARY
Glucose-Capillary: 142 mg/dL — ABNORMAL HIGH (ref 70–99)
Glucose-Capillary: 128 mg/dL — ABNORMAL HIGH (ref 70–99)
Glucose-Capillary: 94 mg/dL (ref 70–99)
Glucose-Capillary: 128 mg/dL — ABNORMAL HIGH (ref 70–99)
Glucose-Capillary: 145 mg/dL — ABNORMAL HIGH (ref 70–99)
Glucose-Capillary: 119 mg/dL — ABNORMAL HIGH (ref 70–99)
Glucose-Capillary: 131 mg/dL — ABNORMAL HIGH (ref 70–99)

## 2020-01-05 MED ORDER — DARBEPOETIN ALFA 100 MCG/0.5ML IJ SOSY
100.0000 ug | PREFILLED_SYRINGE | INTRAMUSCULAR | Status: DC
  Filled 2020-01-05: qty 0.5

## 2020-01-05 MED ORDER — LABETALOL HCL 5 MG/ML IV SOLN
0.5000 mg/min | INTRAVENOUS | Status: DC
  Administered 2020-01-05: 0.5 mg/min via INTRAVENOUS
  Administered 2020-01-06 (×2): 2 mg/min via INTRAVENOUS
  Administered 2020-01-06: 1.5 mg/min via INTRAVENOUS
  Filled 2020-01-05 (×7): qty 100

## 2020-01-05 MED ORDER — CARVEDILOL 6.25 MG PO TABS: 6.2500 mg | ORAL_TABLET | Freq: Two times a day (BID) | ORAL | Status: DC

## 2020-01-05 MED ORDER — HYDRALAZINE HCL 25 MG PO TABS
25.0000 mg | ORAL_TABLET | Freq: Two times a day (BID) | ORAL | Status: DC
  Administered 2020-01-05 – 2020-01-06 (×4): 25 mg
  Filled 2020-01-05 (×5): qty 1

## 2020-01-05 MED ORDER — NITROGLYCERIN 2 % TD OINT
1.0000 [in_us] | TOPICAL_OINTMENT | Freq: Four times a day (QID) | TRANSDERMAL | Status: DC
  Administered 2020-01-05 – 2020-01-06 (×7): 1 [in_us] via TOPICAL
  Filled 2020-01-05: qty 30

## 2020-01-05 MED ORDER — ATORVASTATIN CALCIUM 40 MG PO TABS
40.0000 mg | ORAL_TABLET | Freq: Every day | ORAL | Status: DC
  Administered 2020-01-06: 40 mg via ORAL
  Filled 2020-01-05 (×2): qty 1

## 2020-01-05 MED ORDER — LABETALOL HCL 5 MG/ML IV SOLN
0.5000 mg/min | INTRAVENOUS | Status: DC
  Administered 2020-01-05: 0.5 mg/min via INTRAVENOUS
  Filled 2020-01-05: qty 100

## 2020-01-05 MED ORDER — CARVEDILOL 6.25 MG PO TABS
6.2500 mg | ORAL_TABLET | Freq: Two times a day (BID) | ORAL | Status: DC
  Administered 2020-01-05 (×2): 6.25 mg
  Filled 2020-01-05 (×2): qty 1

## 2020-01-05 NOTE — Progress Notes (Signed)
[facility] Progress Note Patient Name: Patricia [name] DOB: 07/16/1956 MRN: [id number]   Date of Service  01/05/2020  HPI/Events of Note  Hypertension - BP = 196/68 by A-line.  Can't use Nicardipine d/t Hx of prolonged QT interval   eICU Interventions  Will order: 1. Nitroglycerin paste 1 inch to skin now and Q 6 hours/     Intervention Category Major Interventions: Hypertension - evaluation and management  Linas Stepter Eugene 01/05/2020, 3:49 AM

## 2020-01-05 NOTE — Progress Notes (Signed)
Assisted tele visit to patient with daughter.  Trace Wirick M, RN link

## 2020-01-05 NOTE — Progress Notes (Signed)
NAME:  Patricia [name], MRN:  [id number], DOB:  [birth year], LOS: 6 ADMISSION DATE:  12/24/2019, CONSULTATION DATE:  01/05/20 REFERRING MD:  Regenia Skeeter CHIEF COMPLAINT:  Cardiac Arrest   Brief History   64 y.o. F with PMH ESRD, anemia, HFrEF, Depression, Type 2 DM, HTN and obesity who presented in cardiac arrest.  Pt was in her usual state of health and went to dialysis today where she became nauseated and vomited, at home she had a syncopal episode and collapse. CPR initiated and ROSC after 15 mins.    History of present illness   Patricia [name] is a 64 y.o. F with PMH of  ESRD, anemia, HFrEF, Depression, Type 2 DM, HTN and obesity who presented in cardiac arrest. She lives with family and they state she has been in her usual state of health and went to dialysis today where she had an episode of nausea and vomiting and returned home.  There she became sweaty and collapsed, family started CPR immediately and called EMS who performed 15 minutes of CPR with ROSC, no mention of shockable rhythm.    In the ED, pt was not initially responsive and was intubated.  CT head was without acute findings, patient was hypertensive and febrile with a potassium of 2.8 and prolonged QTC.  No sign of STEMI on EKG.  Lactic acid was elevated and chest x-ray showed vascular congestion.  She was given 2 g of magnesium, broad-spectrum antibiotics and IV fluids.  Covid-19 resulted positive.  PCCM consulted for admission.  Family states that she has showed no symptoms of Covid, and they are unsure how she may have contracted this.  Past Medical History   has a past medical history of Allergy, Anemia, Anxiety, Blood transfusion without reported diagnosis, Cardiomyopathy ([place]) (11/2016), CHF (congestive heart failure) ([place]), CKD (chronic kidney disease), stage IV ([place]), Depression, Diabetes mellitus without complication ([place]), Diverticulitis, Dyspnea, Hypertension, and Obesity.   Significant Hospital Events   3/20 admit to  PCCM 3/21 TTM initiated to 33 3/22 completed TTM, rewarmed  Consults:  Nephrology  Procedures:  3/20 ET tube 3/20 CVC > 3/24  Significant Diagnostic Tests:  3/20 CT head>> no acute findings 3/21 EEG>> generalized background slowing, no epileptiform activity 3/21 echocardiogram-LVEF 30 to 35%, no regional wall motion abnormalities, moderate LVH.  Grade 1 diastolic dysfunction.  Mildly dilated LA, normal RV. 3/22 EEG-generalized background attenuation with generalized slowing c/w profound diffuse encephalopathy  Micro Data:  3/20 SARS-Cov-2>> positive 3/22 sputum- GNRs> enterobacter cloacae (R cefazolin, otherwise sensitive)  Antimicrobials:  Cefepime 3/20- Flagyl 3/20 only Vancomycin 3/20-3/22 Zosyn 3/24>>  Interim history/subjective:  No change in level of consciousness today. On labetalol gtt overnight.  Objective   Blood pressure (!) 167/56, pulse 85, temperature (!) 97.3 F (36.3 C), temperature source Axillary, resp. rate 14, height 5\' 4"  (1.626 m), weight 71.2 kg, SpO2 100 %.    Vent Mode: PRVC FiO2 (%):  [30 %] 30 % Set Rate:  [8 bmp] 8 bmp Vt Set:  [430 mL] 430 mL PEEP:  [5 cmH20] 5 cmH20 Pressure Support:  [8 cmH20] 8 cmH20 Plateau Pressure:  [13 cmH20-16 cmH20] 13 cmH20   Intake/Output Summary (Last 24 hours) at 01/05/2020 1014 Last data filed at 01/05/2020 0800 Gross per 24 hour  Intake 1264.18 ml  Output 1300 ml  Net -35.82 ml   Filed Weights   01/03/20 0423 01/04/20 0630 01/04/20 1059  Weight: 67.7 kg 71.7 kg 71.2 kg    General: critically ill appearing  woman laying in bed in NAD HEENT: [place]/AT, eyes anicteric, oral mucosa moist. OGT & ETT. Cardio: Regular rate and rhythm, no murmurs Respiratory: Breathing comfortably on PS 5+ CPAP 5.  CTAB.  Minimal thick tan secretions Abdomen: Soft, nontender, nondistended Extremities: No clubbing, cyanosis, or edema. Derm: No rashes or wounds. Neuro: Opening her eyes spontaneously and intermittently to  stimulation.  Not tracking.  Intact corneal, oculocephalic, pupillary reflexes.  Not withdrawing to pain in any extremity or trapezius squeeze bilaterally.  Not following commands.  +Tracheal gag, biting on ET tube   Resolved Hospital Problem list     Assessment & Plan:   Acute cardiopulmonary arrest -Possibly related to electrolyte disturbance prolonged QTC worsening heart failure, no signs of acute ischemia on EKG and troponin mildly elevated, although echocardiogram suggests against acute MI. -ROSC obtained after 15 minutes of CPR -CT head without acute findings P: -Continue daily aspirin -Continue to monitor electrolytes.  QTC improved previously. -Continue to maintain normothermia, euglycemia, head of bed greater than 30 degrees -Continue vent support -Avoid hypoxia and hypercapnia  Acute respiratory failure requiring mechanical ventilation post cardiac arrest. LLL pneumonia due to Enterobacter likely from aspiration during cardiac arrest. -Continue low tidal volume ventilation, 4 to 8 cc/kg ideal body weight with goal plateau less than 30 and driving pressure less than 15.  At goal. -Continue daily SBT.  Continue to hold sedation. -VAP prevention protocol -Complete 7-day course of Zosyn for aspiration pneumonia  Anoxic encephalopathy -Completed TTM to 33 degrees -Continue to avoid all sedation.  Fentanyl as needed ordered if evidence of pain -Brain MRI  Covid-19 infection -Patient with low-grade fever on arrival, no URI symptoms per family unclear if contributory at all to today's events P: -Continue dexamethasone per protocol.  Previously completed remdesivir.  HFrEF, hypertension.  Acutely worsened systolic function post-arrest. Bradycardia resolved post- rewarming. -Echocardiogram 1 year ago with EF of 40 to 45% -She has recently been taken off all home antihypertensive medication except hydralazine due to borderline low blood pressure -Follows with Dr.  Sallyanne Kuster P: -Continue supportive care -Increase dose of Coreg to 6.5 mg BID.  Need to hold the dose during dialysis. -Continue hydralazine as needed for SBP greater than 170 -Discontinue labetalol infusion  Hyperglycemia, now controlled. DM2 with a1c 7.4 -Continue Lantus 20 units twice daily -Continue tube feeding coverage-NovoLog 8 units every 4 hours -Accu-Cheks every 4 hours with sliding scale insulin as needed -Goal BG 140-180 while admitted to the ICU  F/E/N: P: -Continue tube feeds -Continue to monitor electrolytes  Hypokalemia- resolved Unlikely the sole cause for prolonged QTC given it's persistence after correction of electrolytes. PTA on phenergan. -Qtc 651; 480ms on 3/24 -Per cardiology note she has a history of prolonged QT interval, usually 480-510 ms -Last stress test in 2018 did not show evidence of ischemia she does not have a history of ventricular arrhythmia P: -Continue avoiding all QTC prolonging medications  ESRD -T/TH/sat schedule -Per family it sounds like she completed most of her dialysis 3/20 P: -Appreciate nephrology's assistance.     Best practice:  Diet: TF Pain/Anxiety/Delirium protocol (if indicated): Fentanyl propofol VAP protocol (if indicated): HOB 30 degrees, daily SBT as sedation will allow DVT prophylaxis: Heparin GI prophylaxis: Protonix Glucose control: SSI Mobility: Bedrest Code Status: Full code   Family Communication: daughter Almon Register Disposition: ICU  Labs   CBC: Recent Labs  Lab 12/24/2019 2007 12/29/2019 2015 12/31/19 1758 01/01/20 0401 01/01/20 0432 01/01/20 1029 01/02/20 0355 01/03/20 0350 01/04/20 0729  WBC  12.3*   < > 4.6  --  7.4  --  16.6* 15.9* 17.0*  NEUTROABS 8.5*  --  3.2  --   --   --   --  14.1*  --   HGB 7.1*   < > 7.8*   < > 9.0* 8.5* 8.5* 9.2* 8.8*  HCT 23.6*   < > 23.7*   < > 27.5* 25.0* 27.0* 28.2* 27.1*  MCV 97.5   < > 89.8  --  90.5  --  94.7 91.9 89.4  PLT 213   < > 155  --  175  --  332 240  257   < > = values in this interval not displayed.    Basic Metabolic Panel: Recent Labs  Lab 01/01/20 0202 01/01/20 0401 01/01/20 1132 01/01/20 1342 01/01/20 1626 01/01/20 1810 01/02/20 0200 01/02/20 0355 01/03/20 0350 01/04/20 0702 01/05/20 0500  NA 136   < >   < >  --  136   < > 137 136 138 137 136  K 4.5   < >   < >  --  5.1   < > 5.5* 5.4* 3.8 4.1 4.6  CL 105   < >   < >  --  105   < > 105 106 102 100 97*  CO2 19*   < >   < >  --  22   < > 20* 20* 24 25 25   GLUCOSE 140*   < >   < >  --  210*   < > 309* 313* 215* 81 122*  BUN 23   < >   < >  --  32*   < > 43* 45* 41* 68* 50*  CREATININE 2.87*   < >   < >  --  3.42*   < > 4.21* 4.25* 3.33* 4.38* 3.47*  CALCIUM 7.5*   < >   < >  --  7.4*   < > 7.4* 7.4* 7.2* 7.7* 7.9*  MG 1.9  --   --  2.0 2.0  --   --  2.1 1.9  --   --   PHOS 4.9*   < >  --  6.9* 7.2*  --   --  7.3* 3.1 2.7  --    < > = values in this interval not displayed.   GFR: Estimated Creatinine Clearance: 16.5 mL/min (A) (by C-G formula based on SCr of 3.47 mg/dL (H)). Recent Labs  Lab 01/06/2020 2007 12/21/2019 2007 12/27/2019 2243 12/31/19 0230 12/31/19 0433 12/31/19 1758 01/01/20 0202 01/01/20 0432 01/02/20 0355 01/03/20 0350 01/04/20 0729  PROCALCITON  --   --   --  9.78  --   --  24.23  --  21.64  --   --   WBC 12.3*   < >  --  8.4   < >   < >  --  7.4 16.6* 15.9* 17.0*  LATICACIDVEN 6.2*  --  1.6  --   --   --   --   --   --   --   --    < > = values in this interval not displayed.    Liver Function Tests: Recent Labs  Lab 01/10/2020 2007 12/25/2019 2007 12/31/19 1353 01/01/20 0202 01/02/20 0355 01/04/20 0702 01/05/20 0500  AST 105*  --  53* 50* 40  --  29  ALT 45*  --  30 33 29  --  21  ALKPHOS 70  --  52 51 61  --  82  BILITOT 0.5  --  0.6 0.5 0.4  --  0.7  PROT 5.4*  --  4.3* 4.5* 4.8*  --  5.1*  ALBUMIN 1.8*   < > 1.3* 1.3* 1.5* 1.2* 1.3*   < > = values in this interval not displayed.   No results for input(s): LIPASE, AMYLASE in the  last 168 hours. No results for input(s): AMMONIA in the last 168 hours.  ABG    Component Value Date/Time   PHART 7.339 (L) 01/01/2020 1029   PCO2ART 40.4 01/01/2020 1029   PO2ART 99.0 01/01/2020 1029   HCO3 21.7 01/01/2020 1029   TCO2 23 01/01/2020 1029   ACIDBASEDEF 4.0 (H) 01/01/2020 1029   O2SAT 97.0 01/01/2020 1029     Coagulation Profile: Recent Labs  Lab 12/28/2019 2007 12/31/19 0433 12/31/19 1140 01/01/20 0432  INR 1.1 1.1 1.2 1.1    Cardiac Enzymes: No results for input(s): CKTOTAL, CKMB, CKMBINDEX, TROPONINI in the last 168 hours.  HbA1C: HbA1c, POC (prediabetic range)  Date/Time Value Ref Range Status  10/18/2018 10:39 AM 6.0 5.7 - 6.4 % Final   Hgb A1c MFr Bld  Date/Time Value Ref Range Status  11/20/2019 01:00 PM 7.4 (H) 4.8 - 5.6 % Final    Comment:    (NOTE)         Prediabetes: 5.7 - 6.4         Diabetes: >6.4         Glycemic control for adults with diabetes: <7.0   06/16/2019 09:20 PM 7.0 (H) 4.8 - 5.6 % Final    Comment:    (NOTE) Pre diabetes:          5.7%-6.4% Diabetes:              >6.4% Glycemic control for   <7.0% adults with diabetes     CBG: Recent Labs  Lab 01/04/20 1919 01/04/20 2336 01/05/20 0125 01/05/20 0336 01/05/20 0735  GLUCAP 141* 126* 142* 128* 94    This patient is critically ill with multiple organ system failure which requires frequent high complexity decision making, assessment, support, evaluation, and titration of therapies. This was completed through the application of advanced monitoring technologies and extensive interpretation of multiple databases. During this encounter critical care time was devoted to patient care services described in this note for 36 minutes.    Julian Hy, DO 01/05/20 10:14 AM [facility]

## 2020-01-05 NOTE — Progress Notes (Signed)
Notified Md of patient hypertensive state blood pressure 338V systolic awaiting MD new orders

## 2020-01-05 NOTE — Progress Notes (Signed)
Patients daughter called and given update.  

## 2020-01-05 NOTE — Progress Notes (Signed)
Patient ID: Patricia [name], female   DOB: 1956-06-03, 64 y.o.   MRN: [id number]  [facility] Progress Note   Assessment/ Plan:   1.  Cardiac arrest/acute respiratory failure: On PSV.  Neuro status not improving but does open eyes spontaneously; per neuro and CCM 2. ESRD: Continue hemodialysis on TTS schedule: 3.5h, TDC, 400/800, 2K, Tight Heparin, 2L UF 3. Anemia: Hb stable 8.8.  Ferritin elevated from #5.  ESA qSat 4. 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 while intubated, calcium and phosphorus levels at goal. 5.  COVID-19 infection: Incidental finding and currently on management with Decadron and remdesivir. Per CCM  Subjective:   No interval change. Remains on PSV. HD yesterday.   Objective:   BP (!) 167/56   Pulse 85   Temp (!) 97.3 F (36.3 C) (Axillary)   Resp 14   Ht _0  (1.626 m)   Wt 71.2 kg   SpO2 100%   BMI 26.94 kg/m   Physical Exam: Gen: Intubated, resting comfortably CVS: Pulse regular rhythm, normal rate Resp: Anteriorly clear to auscultation, no distinct rales or rhonchi.  Right IJ TDC Abd: Soft, flat, nontender Ext: Trace lower extremity edema  Labs: BMET Recent Labs  Lab 12/31/19 1353 12/31/19 2008 01/01/20 0202 01/01/20 0401 01/01/20 1132 01/01/20 1342 01/01/20 1626 01/01/20 1810 01/01/20 2200 01/02/20 0000 01/02/20 0200 01/02/20 0355 01/03/20 0350 01/04/20 0702 01/05/20 0500  NA 136   < > 136   < >   < >  --  136   < > 137 136 137 136 138 137 136  K 4.0   < > 4.5   < >   < >  --  5.1   < > 5.3* 5.6* 5.5* 5.4* 3.8 4.1 4.6  CL 104   < > 105   < >   < >  --  105   < > 106 104 105 106 102 100 97*  CO2 22   < > 19*   < >   < >  --  22   < > 21* 20* 20* 20* _1 GLUCOSE 157*   < > 140*   < >   < >  --  210*   < > 279* 299* 309* 313* 215* 81 122*  BUN 18   < > 23   < >   < >  --  32*   < > 38* 41* 43* 45* 41* 68* 50*  CREATININE 2.62*   < > 2.87*   < >   < >  --  3.42*   < > 3.87* 4.03* 4.21* 4.25* 3.33* 4.38* 3.47*  CALCIUM 7.2*   < >  7.5*   < >   < >  --  7.4*   < > 7.5* 7.5* 7.4* 7.4* 7.2* 7.7* 7.9*  PHOS 4.0  --  4.9*  --   --  6.9* 7.2*  --   --   --   --  7.3* 3.1 2.7  --    < > = values in this interval not displayed.   CBC Recent Labs  Lab 12/12/2019 2007 12/16/2019 2015 12/31/19 1758 01/01/20 0401 01/01/20 0432 01/01/20 0432 01/01/20 1029 01/02/20 0355 01/03/20 0350 01/04/20 0729  WBC 12.3*   < > 4.6  --  7.4  --   --  16.6* 15.9* 17.0*  NEUTROABS 8.5*  --  3.2  --   --   --   --   --  14.1*  --   HGB 7.1*   < > 7.8*   < > 9.0*   < > 8.5* 8.5* 9.2* 8.8*  HCT 23.6*   < > 23.7*   < > 27.5*   < > 25.0* 27.0* 28.2* 27.1*  MCV 97.5   < > 89.8  --  90.5  --   --  94.7 91.9 89.4  PLT 213   < > 155  --  175  --   --  332 240 257   < > = values in this interval not displayed.      Medications:    . aspirin  81 mg Per Tube Daily  . chlorhexidine gluconate (MEDLINE KIT)  15 mL Mouth Rinse BID  . Chlorhexidine Gluconate Cloth  6 each Topical Daily  . Chlorhexidine Gluconate Cloth  6 each Topical Q0600  . dexamethasone (DECADRON) injection  6 mg Intravenous Q24H  . feeding supplement (PRO-STAT SUGAR FREE 64)  30 mL Per Tube BID  . heparin injection (subcutaneous)  5,000 Units Subcutaneous Q8H  . insulin aspart  0-24 Units Subcutaneous Q4H  . insulin aspart  8 Units Subcutaneous Q4H  . insulin glargine  20 Units Subcutaneous BID  . mouth rinse  15 mL Mouth Rinse 10 times per day  . multivitamin  1 tablet Per Tube QHS  . nitroGLYCERIN  1 inch Topical Q6H  . pantoprazole (PROTONIX) IV  40 mg Intravenous QHS   Rexene Agent , MD 01/05/2020, 10:00 AM

## 2020-01-05 NOTE — Plan of Care (Signed)
Daughter Almon Register called and updated on MRI findings of CVA.   Julian Hy, DO 01/05/20 7:56 PM [facility]

## 2020-01-06 DIAGNOSIS — G931 Anoxic brain damage, not elsewhere classified: Secondary | ICD-10-CM | POA: Diagnosis not present

## 2020-01-06 LAB — CBC
HCT: 23.3 % — ABNORMAL LOW (ref 36.0–46.0)
Hemoglobin: 7.7 g/dL — ABNORMAL LOW (ref 12.0–15.0)
MCH: 29.6 pg (ref 26.0–34.0)
MCHC: 33 g/dL (ref 30.0–36.0)
MCV: 89.6 fL (ref 80.0–100.0)
Platelets: 242 10*3/uL (ref 150–400)
RBC: 2.6 MIL/uL — ABNORMAL LOW (ref 3.87–5.11)
RDW: 17.8 % — ABNORMAL HIGH (ref 11.5–15.5)
WBC: 9.8 10*3/uL (ref 4.0–10.5)
nRBC: 0.2 % (ref 0.0–0.2)

## 2020-01-06 LAB — POCT I-STAT 7, (LYTES, BLD GAS, ICA,H+H)
Acid-Base Excess: 3 mmol/L — ABNORMAL HIGH (ref 0.0–2.0)
Bicarbonate: 26.9 mmol/L (ref 20.0–28.0)
Calcium, Ion: 1.17 mmol/L (ref 1.15–1.40)
HCT: 24 % — ABNORMAL LOW (ref 36.0–46.0)
Hemoglobin: 8.2 g/dL — ABNORMAL LOW (ref 12.0–15.0)
O2 Saturation: 99 %
Potassium: 3.1 mmol/L — ABNORMAL LOW (ref 3.5–5.1)
Sodium: 138 mmol/L (ref 135–145)
TCO2: 28 mmol/L (ref 22–32)
pCO2 arterial: 35.2 mmHg (ref 32.0–48.0)
pH, Arterial: 7.492 — ABNORMAL HIGH (ref 7.350–7.450)
pO2, Arterial: 123 mmHg — ABNORMAL HIGH (ref 83.0–108.0)

## 2020-01-06 LAB — COMPREHENSIVE METABOLIC PANEL
ALT: 21 U/L (ref 0–44)
AST: 30 U/L (ref 15–41)
Albumin: 1.2 g/dL — ABNORMAL LOW (ref 3.5–5.0)
Alkaline Phosphatase: 81 U/L (ref 38–126)
Anion gap: 16 — ABNORMAL HIGH (ref 5–15)
BUN: 78 mg/dL — ABNORMAL HIGH (ref 8–23)
CO2: 23 mmol/L (ref 22–32)
Calcium: 8.1 mg/dL — ABNORMAL LOW (ref 8.9–10.3)
Chloride: 93 mmol/L — ABNORMAL LOW (ref 98–111)
Creatinine, Ser: 4.14 mg/dL — ABNORMAL HIGH (ref 0.44–1.00)
GFR calc Af Amer: 13 mL/min — ABNORMAL LOW (ref >60)
GFR calc non Af Amer: 11 mL/min — ABNORMAL LOW (ref >60)
Glucose, Bld: 287 mg/dL — ABNORMAL HIGH (ref 70–99)
Potassium: 5.3 mmol/L — ABNORMAL HIGH (ref 3.5–5.1)
Sodium: 132 mmol/L — ABNORMAL LOW (ref 135–145)
Total Bilirubin: 0.6 mg/dL (ref 0.3–1.2)
Total Protein: 4.7 g/dL — ABNORMAL LOW (ref 6.5–8.1)

## 2020-01-06 LAB — GLUCOSE, CAPILLARY
Glucose-Capillary: 276 mg/dL — ABNORMAL HIGH (ref 70–99)
Glucose-Capillary: 249 mg/dL — ABNORMAL HIGH (ref 70–99)
Glucose-Capillary: 96 mg/dL (ref 70–99)
Glucose-Capillary: 104 mg/dL — ABNORMAL HIGH (ref 70–99)
Glucose-Capillary: 120 mg/dL — ABNORMAL HIGH (ref 70–99)
Glucose-Capillary: 87 mg/dL (ref 70–99)
Glucose-Capillary: 94 mg/dL (ref 70–99)
Glucose-Capillary: 124 mg/dL — ABNORMAL HIGH (ref 70–99)

## 2020-01-06 MED ORDER — DARBEPOETIN ALFA 200 MCG/0.4ML IJ SOSY
200.0000 ug | PREFILLED_SYRINGE | INTRAMUSCULAR | Status: DC
  Filled 2020-01-06: qty 0.4

## 2020-01-06 MED ORDER — HEPARIN SODIUM (PORCINE) 1000 UNIT/ML IJ SOLN
INTRAMUSCULAR | Status: AC
  Administered 2020-01-06: 1000 [IU]
  Filled 2020-01-06: qty 8

## 2020-01-06 MED ORDER — HEPARIN SODIUM (PORCINE) 10000 UNIT/ML IJ SOLN
7500.0000 [IU] | Freq: Three times a day (TID) | INTRAMUSCULAR | Status: DC
  Administered 2020-01-06 – 2020-01-07 (×3): 7500 [IU] via SUBCUTANEOUS
  Filled 2020-01-06 (×4): qty 1

## 2020-01-06 MED ORDER — DARBEPOETIN ALFA 100 MCG/0.5ML IJ SOSY
PREFILLED_SYRINGE | INTRAMUSCULAR | Status: AC
  Administered 2020-01-06: 100 ug
  Filled 2020-01-06: qty 0.5

## 2020-01-06 MED ORDER — HYDRALAZINE HCL 10 MG PO TABS
10.0000 mg | ORAL_TABLET | Freq: Four times a day (QID) | ORAL | Status: DC
  Administered 2020-01-06 – 2020-01-07 (×3): 10 mg via ORAL
  Filled 2020-01-06 (×3): qty 1

## 2020-01-06 NOTE — Progress Notes (Signed)
[facility] Progress Note Patient Name: Patricia [name] DOB: 02/05/1956 MRN: [id number]   Date of Service  01/06/2020  HPI/Events of Note  Bedside RN asking threshold for discontinuing Labetalol infusion.  eICU Interventions  Discontinue labetalol for SBL < 170 mmHg        Keandrea Tapley U Rehmat Murtagh 01/06/2020, 10:51 PM

## 2020-01-06 NOTE — Progress Notes (Signed)
Discontinuing Labetalol drip per parameters given by Dr. Lucile Shutters with E-link, PO Hydralazine given and PRN hydralazine already in [facility] to maintain SBP <170.

## 2020-01-06 NOTE — Consult Note (Signed)
                    NEURO HOSPITALIST CONSULT NOTE   Requesting physician: Dr. Clark  Reason for Consult:stroke seen on MRI   History obtained from:  Chart review  HPI:                                                                                                                                          Patricia [name] is an 64 y.o. female  With PMH ESRD, DM2, HTn, obesity, anemia who presented in cardiac arrest.  Per chart review patient went to dialysis as usual there she had an episode of nausea and vomiting. Returned home  Became sweaty and collapsed and cardiac arrested. CPR was initiated and ROSC after 15 mins  Patient unable to provide any history at this time.  01/09/2020: cardiac arrest, intubated, admitted to [facility]-- K 2.8, hypertensive and febrile. COVID-19+, CTH , no acute findings, started on broad spectrum antibiotics 3/26: MRI showed acute infarct right occipital cortex and bilateral cerebral hemispheres.FLAIR shows anoxic brain injury.  3/27 neurology consulted Past Medical History:  Diagnosis Date  . Allergy   . Anemia   . Anxiety   . Blood transfusion without reported diagnosis   . Cardiomyopathy (HCC) 11/2016   no ischemic eval due to CKD  . CHF (congestive heart failure) (HCC)   . CKD (chronic kidney disease), stage IV (HCC)   . Depression   . Diabetes mellitus without complication (HCC)    type 2  . Diverticulitis   . Dyspnea   . Hypertension   . Obesity     Past Surgical History:  Procedure Laterality Date  . ABDOMINAL HYSTERECTOMY    . BREAST SURGERY     reduction  . CESAREAN SECTION  1982, 1988  . IR FLUORO GUIDE CV LINE RIGHT  11/20/2019  . IR FLUORO GUIDE CV LINE RIGHT  11/23/2019  . IR US GUIDE VASC ACCESS RIGHT  11/20/2019  . LAPAROSCOPY N/A 06/16/2019   Procedure: primary closure of umbilical hernia;  Surgeon: Ramirez, Armando, MD;  Location: [facility] OR;  Service: General;  Laterality: N/A;  . LIPOMA EXCISION     back  Dr. Hoxworth 03-22-18  . LIPOMA  EXCISION N/A 03/22/2018   Procedure: EXCISION OF BACK LIPOMA;  Surgeon: Hoxworth, Benjamin, MD;  Location: [facility];  Service: General;  Laterality: N/A;  . REDUCTION MAMMAPLASTY Bilateral     Family History  Problem Relation Age of Onset  . Diabetes Brother   . Stomach cancer Brother   . Kidney disease Brother   . Heart Problems Maternal Grandmother        pacemaker  . Heart disease Maternal Grandfather   . Rectal cancer Neg Hx   . Esophageal cancer Neg Hx   . Liver cancer Neg Hx   . Colon cancer Neg Hx             Social History:  reports that she has never smoked. She has never used smokeless tobacco. She reports that she does not drink alcohol or use drugs.  Allergies  Allergen Reactions  . Codone [Hydrocodone] Itching  . Dextromethorphan-Guaifenesin Nausea And Vomiting  . Norvasc [Amlodipine Besylate] Swelling    Lower extremity  . Hydralazine Swelling and Other (See Comments)    Leg swelling. Pt takes this as o/p. Patient can take PO now  . Sulfa Antibiotics Itching and Rash    MEDICATIONS:                                                                                                                     Scheduled: . aspirin  81 mg Per Tube Daily  . atorvastatin  40 mg Oral q1800  . chlorhexidine gluconate (MEDLINE KIT)  15 mL Mouth Rinse BID  . Chlorhexidine Gluconate Cloth  6 each Topical Daily  . Chlorhexidine Gluconate Cloth  6 each Topical Q0600  . darbepoetin (ARANESP) injection - DIALYSIS  200 mcg Intravenous Q Sat-HD  . dexamethasone (DECADRON) injection  6 mg Intravenous Q24H  . feeding supplement (PRO-STAT SUGAR FREE 64)  30 mL Per Tube BID  . heparin      . heparin injection (subcutaneous)  7,500 Units Subcutaneous Q8H  . hydrALAZINE  10 mg Oral Q6H  . hydrALAZINE  25 mg Per Tube BID  . insulin aspart  0-24 Units Subcutaneous Q4H  . insulin aspart  8 Units Subcutaneous Q4H  . insulin glargine  20 Units Subcutaneous BID  . mouth rinse  15 mL Mouth Rinse 10  times per day  . multivitamin  1 tablet Per Tube QHS  . nitroGLYCERIN  1 inch Topical Q6H  . pantoprazole (PROTONIX) IV  40 mg Intravenous QHS   Continuous: . feeding supplement (VITAL 1.5 CAL) 40 mL/hr at 01/04/20 2300  . labetalol (NORMODYNE) infusion 2 mg/min (01/06/20 1000)  . norepinephrine (LEVOPHED) Adult infusion Stopped (01/04/20 1039)  . piperacillin-tazobactam (ZOSYN)  IV 12.5 mL/hr at 01/06/20 1000   PRN:fentaNYL (SUBLIMAZE) injection, fentaNYL (SUBLIMAZE) injection, heparin, hydrALAZINE  ROS:                                                                                                                                        unobtainable from patient due to mental status and intubation  Blood pressure (!) 96/48, pulse 82, temperature 97.6 F (  36.4 C), temperature source Axillary, resp. rate 11, height 5' 4" (1.626 m), weight 69 kg, SpO2 100 %.  General Examination:                                                                                                      Physical Exam  Constitutional: Appears well-developed and well-nourished.  Eyes: Normal external eye and conjunctiva. HENT: Normocephalic, no lesions, without obvious abnormality.   Cardiovascular: Normal rate and regular rhythm.  Respiratory: Effort normal, non-labored breathing saturations WNL intubated GI: Soft.  No distension. There is no tenderness.  Skin: WDI  Neurological Examination Mental Status: Eyes closed, opened eyes to sternal rub. Does not follow commands. Breathing above the vent.   Cranial Nerves: PERRL, roving eyes noted. face appears symmetric in presence of ETT cough and gag present.  Motor/sensory: No movement to noxious stimulation in any of the 4 extremities.  No spontaneous movements. Cerebellar: UTA Gait: deferred   Lab Results: Basic Metabolic Panel: Recent Labs  Lab 01/01/20 0202 01/01/20 0401 01/01/20 1132 01/01/20 1342 01/01/20 1626 01/01/20 1810 01/02/20 0355  01/02/20 0355 01/03/20 0350 01/03/20 0350 01/04/20 0702 01/05/20 0500 01/06/20 0533  NA 136   < >   < >  --  136   < > 136  --  138  --  137 136 132*  K 4.5   < >   < >  --  5.1   < > 5.4*  --  3.8  --  4.1 4.6 5.3*  CL 105   < >   < >  --  105   < > 106  --  102  --  100 97* 93*  CO2 19*   < >   < >  --  22   < > 20*  --  24  --  _0 GLUCOSE 140*   < >   < >  --  210*   < > 313*  --  215*  --  81 122* 287*  BUN 23   < >   < >  --  32*   < > 45*  --  41*  --  68* 50* 78*  CREATININE 2.87*   < >   < >  --  3.42*   < > 4.25*  --  3.33*  --  4.38* 3.47* 4.14*  CALCIUM 7.5*   < >   < >  --  7.4*   < > 7.4*   < > 7.2*   < > 7.7* 7.9* 8.1*  MG 1.9  --   --  2.0 2.0  --  2.1  --  1.9  --   --   --   --   PHOS 4.9*   < >  --  6.9* 7.2*  --  7.3*  --  3.1  --  2.7  --   --    < > = values in this interval not displayed.    CBC: Recent Labs  Lab 12/29/2019 2007 01/06/2020 2015 12/31/19 1758  01/01/20 0401 01/01/20 0432 01/01/20 0432 01/01/20 1029 01/02/20 0355 01/03/20 0350 01/04/20 0729 01/06/20 0533  WBC 12.3*   < > 4.6  --  7.4  --   --  16.6* 15.9* 17.0* 9.8  NEUTROABS 8.5*  --  3.2  --   --   --   --   --  14.1*  --   --   HGB 7.1*   < > 7.8*   < > 9.0*   < > 8.5* 8.5* 9.2* 8.8* 7.7*  HCT 23.6*   < > 23.7*   < > 27.5*   < > 25.0* 27.0* 28.2* 27.1* 23.3*  MCV 97.5   < > 89.8  --  90.5  --   --  94.7 91.9 89.4 89.6  PLT 213   < > 155  --  175  --   --  332 240 257 242   < > = values in this interval not displayed.    Lipid Panel: Recent Labs  Lab 01/02/20 0355  TRIG 82    Imaging: MR BRAIN WO CONTRAST  Result Date: 01/05/2020 CLINICAL DATA:  Anoxic brain injury.  ESRD with CPR EXAM: MRI HEAD WITHOUT CONTRAST TECHNIQUE: Multiplanar, multiecho pulse sequences of the brain and surrounding structures were obtained without intravenous contrast. COMPARISON:  CT head 12/21/2019 FINDINGS: Brain: Small area of acute infarct in the right occipital cortex. No other acute infarct.  Mild chronic microvascular ischemic changes in the pons. Several small areas of chronic microhemorrhage in the brain. Negative for mass or edema. Ventricle size normal. Vascular: Normal arterial flow voids. Skull and upper cervical spine: Negative Sinuses/Orbits: Patient is intubated with secretions in the pharynx. Mild mucosal edema paranasal sinuses. Bilateral cataract surgery Other: None IMPRESSION: Small area of acute infarct in the right occipital cortex. This is more likely to be an embolus than anoxic brain injury. Mild chronic microvascular ischemic changes in the white matter. Scattered areas of microhemorrhage in the brain correlate with hypertension history. Electronically Signed   By: Charles  Clark M.D.   On: 01/05/2020 14:09    Assessment:  Patricia [name] is an 64 y.o. female  With PMH ESRD, DM2, HTn, obesity, anemia who presented in cardiac arrest.   Exam shows roving eye movements consistent with severe encephalopathy and she has intact brainstem reflexes.  To noxious stimulation, she opens eyes but does not follow commands and does not localize. MRI was revealing of a small occipital stroke-likely secondary to the multiple risk factors as well as CPR.  Most concerning finding on the MRI was symmetric hyperintensities in bilateral cerebral hemispheres, deep gray matter matter and diffuse in the cortex consistent with hypoxic/anoxic brain injury.   She has already had an echocardiogram which shows an ejection fraction of 30 to 35% with moderately decreased LVEF, normal LVEF, mild dilatation of left atrium, and no other cause of stroke identified.  Stroke work-up can be completed with adding hemoglobin A1c and lipid panel but I think the most pressing concern for her is the hypoxic/anoxic brain injury.  The absence of meaningful clinical response to voice and noxious stimulation and no evidence of meaningful cortical function, is concerning for poor chances for neurologically meaningful  recovery.  Impression -Hypoxic/anoxic brain injury -Acute ischemic stroke -COVID-19 infection   Recommendations: -Continue supportive care and manage of other comorbidities including renal dysfunction/Covid pneumonia per PCCM as you are -MRI did show some chronic microhemorrhages but they are not big enough to hold any kind of   anticoagulation at this time if needed for treatment of Covid or other medical conditions. -Can consider TEE for an embolic source but the stroke is small and at this point more pressing concern is the hypoxic/anoxic brain injury and potentially irreversible brain damage from it. -Would encourage family discussion about the severity of hypoxic/anoxic brain injury and the possibility of irreversible brain damage and grim chances for neurological meaningful recovery. -Can obtain carotid Dopplers, A1c and lipid panel to complete stroke work-up   Laurey Morale, MSN, NP-C [facility] [phone]  Attending neurologist's note to follow   01/06/2020, 10:23 AM  Attending Neurohospitalist Addendum Patient seen and examined with APP/Resident. Agree with the history and physical as documented above. Agree with the plan as documented, which I helped formulate. I have independently reviewed the chart, obtained history, review of systems and examined the patient.I have personally reviewed pertinent head/neck/spine imaging (CT/MRI).  Briefly, 64 year old woman past history of ESRD, diabetes hypertension obesity and anemia presented cardiac arrest. Unable to provide history or review of systems  On examination, off sedation, pupils equal round reactive to light, breathing over the ventilator, roving eye movements, does not follow commands, does not withdraw to noxious stimulation, no spontaneous movements.  Imaging reviewed by me personally-called radiology to discuss-initial read was concerning only for an occipital stroke but on my review, there was  extensive bilateral cerebellar, deep gray matter as well as cortical hyperintensity suggestive of global hypoxic/anoxic brain injury.  Dr. Carlis Abbott from radiology agreed and has added an addendum to the report.  I am more concerned about her hypoxic/anoxic brain injury and I think that the stroke was likely resultant of either small vessel disease or embolic infarct in the setting of the cardiac arrest and cardiomyopathy-low EF.    If the patient remains full scope of care , can pursue a transesophageal echocardiogram to evaluate for an embolic source but I would encourage discussing with the family regarding the possibility of an irreversible brain injury and grim chances for neurologically meaningful recovery, and goals of care.  I have spoken to the primary team attending, Dr. Collier Bullock over the phone   Please feel free to call with any questions. --- Amie Portland, MD [facility] Pager: [phone]  If 7pm to 7am, please call on call as listed on [facility].   CRITICAL CARE ATTESTATION Performed by: Amie Portland, MD Total critical care time:40 minutes Critical care time was exclusive of separately billable procedures and treating other patients and/or supervising APPs/Residents/Students Critical care was necessary to treat or prevent imminent or life-threatening deterioration due to hypoxic/anoxic encephalopathy, acute ischemic stroke. This patient is critically ill and at significant risk for neurological worsening and/or death and care requires constant monitoring. Critical care was time spent personally by me on the following activities: development of treatment plan with patient and/or surrogate as well as nursing, discussions with consultants, evaluation of patient's response to treatment, examination of patient, obtaining history from patient or surrogate, ordering and performing treatments and interventions, ordering and review of laboratory studies, ordering and review  of radiographic studies, pulse oximetry, re-evaluation of patient's condition, participation in multidisciplinary rounds and medical decision making of high complexity in the care of this patient.

## 2020-01-06 NOTE — Progress Notes (Addendum)
NAME:  Patricia [name], MRN:  [id number], DOB:  1955-11-09, LOS: 7 ADMISSION DATE:  12/29/2019, CONSULTATION DATE:  01/06/20 REFERRING MD:  Regenia Skeeter CHIEF COMPLAINT:  Cardiac Arrest   Brief History   64 y.o. F with PMH ESRD, anemia, HFrEF, Depression, Type 2 DM, HTN and obesity who presented in cardiac arrest.  Pt was in her usual state of health and went to dialysis 3/20 where she became nauseated and vomited, at home she had a syncopal episode and collapse. CPR initiated and ROSC after 15 mins.    History of present illness   Patricia [name] is a 64 y.o. F with PMH of  ESRD, anemia, HFrEF, Depression, Type 2 DM, HTN and obesity who presented in cardiac arrest. She lives with family and they state she has been in her usual state of health and went to dialysis today where she had an episode of nausea and vomiting and returned home.  There she became sweaty and collapsed, family started CPR immediately and called EMS who performed 15 minutes of CPR with ROSC, no mention of shockable rhythm.    In the ED, pt was not initially responsive and was intubated.  CT head was without acute findings, patient was hypertensive and febrile with a potassium of 2.8 and prolonged QTC.  No sign of STEMI on EKG.  Lactic acid was elevated and chest x-ray showed vascular congestion.  She was given 2 g of magnesium, broad-spectrum antibiotics and IV fluids.  Covid-19 resulted positive.  PCCM consulted for admission.  Family states that she has showed no symptoms of Covid, and they are unsure how she may have contracted this.  Past Medical History   has a past medical history of Allergy, Anemia, Anxiety, Blood transfusion without reported diagnosis, Cardiomyopathy ([place]) (11/2016), CHF (congestive heart failure) ([place]), CKD (chronic kidney disease), stage IV ([place]), Depression, Diabetes mellitus without complication ([place]), Diverticulitis, Dyspnea, Hypertension, and Obesity.   Significant Hospital Events   3/20 admit to  PCCM 3/21 TTM initiated to 33 3/22 completed TTM, rewarmed  Consults:  Nephrology  Procedures:  3/20 ET tube 3/20 CVC > 3/24  Significant Diagnostic Tests:  3/20 CT head>> no acute findings 3/21 EEG>> generalized background slowing, no epileptiform activity 3/21 echocardiogram-LVEF 30 to 35%, no regional wall motion abnormalities, moderate LVH.  Grade 1 diastolic dysfunction.  Mildly dilated LA, normal RV. 3/22 EEG-generalized background attenuation with generalized slowing c/w profound diffuse encephalopathy  3/24: MRI IMPRESSION: Small area of acute infarct in the right occipital cortex. This is more likely to be an embolus than anoxic brain injury.  Mild chronic microvascular ischemic changes in the white matter. Scattered areas of microhemorrhage in the brain correlate with hypertension history.   Micro Data:  3/20 SARS-Cov-2>> positive 3/22 sputum- GNRs> enterobacter cloacae (R cefazolin, otherwise sensitive)  Antimicrobials:  Cefepime 3/20- Flagyl 3/20 only Vancomycin 3/20-3/22 Zosyn 3/24>>  Interim history/subjective:  Still opening eyes to voice, not following commands.   Objective   Blood pressure 140/65, pulse 95, temperature 97.8 F (36.6 C), temperature source Axillary, resp. rate (!) 25, height 5\' 4"  (1.626 m), weight 71.2 kg, SpO2 100 %.    Vent Mode: PRVC FiO2 (%):  [30 %] 30 % Set Rate:  [8 bmp] 8 bmp Vt Set:  [430 mL] 430 mL PEEP:  [5 cmH20] 5 cmH20 Pressure Support:  [5 cmH20] 5 cmH20 Plateau Pressure:  [12 cmH20-14 cmH20] 13 cmH20   Intake/Output Summary (Last 24 hours) at 01/06/2020 0254 Last data filed at  01/06/2020 0600 Gross per 24 hour  Intake 975.01 ml  Output 1 ml  Net 974.01 ml   Filed Weights   01/03/20 0423 01/04/20 0630 01/04/20 1059  Weight: 67.7 kg 71.7 kg 71.2 kg    General: critically ill appearing woman laying in bed in NAD HEENT: [place]/AT, eyes anicteric, oral mucosa moist. OGT & ETT. Cardio: Regular rate and rhythm, no  murmurs Respiratory: CTAB.  Abdomen: Soft, nontender, nondistended Extremities: No clubbing, cyanosis, or edema. Derm: No rashes or wounds. Neuro: Opening her eyes spontaneously and intermittently to stimulation.  Not tracking.  Pupils reactive to light  Not withdrawing to pain in any extremity or trapezius squeeze bilaterally.  Not following commands.    Resolved Hospital Problem list     Assessment & Plan:   Acute cardiopulmonary arrest -Possibly related to electrolyte disturbance prolonged QTC worsening, heart failure.  no signs of acute ischemia on EKG and troponin mildly elevated, echocardiogram suggests against acute MI. -ROSC obtained after 15 minutes of CPR -CT head without acute findings P: -Continue daily aspirin -Continue to monitor electrolytes.  QTC improved previously. -Continue to maintain normothermia, euglycemia, head of bed greater than 30 degrees -Continue vent support -Avoid hypoxia and hypercapnia SBT trial today but mental status precludes extubation at this point.   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  Embolic?  No evidence of clot on Echo.  Consider TEE when more stable.   Neuro Microhemorrhage - hold off on full AC?   Acute respiratory failure requiring mechanical ventilation post cardiac arrest. LLL pneumonia due to Enterobacter likely from aspiration during cardiac arrest. -Continue low tidal volume ventilation, 4 to 8 cc/kg ideal body weight with goal plateau less than 30 and driving pressure less than 15.  At goal. -Continue daily SBT.  Continue to hold sedation. -VAP prevention protocol -Complete 7-day course of Zosyn for aspiration pneumonia  Anoxic encephalopathy -Completed TTM to 33 degrees -Continue to avoid all sedation.  Fentanyl as needed ordered if evidence of pain -Brain MRI without anoxic injury  Covid-19 infection -Patient with low-grade fever on arrival, no URI symptoms per family unclear if  contributory at all to today's events P: -Continue dexamethasone per protocol (day 8).  Previously completed remdesivir.  HFrEF, hypertension.  Acutely worsened systolic function post-arrest. Bradycardia resolved post- rewarming. -Echocardiogram 1 year ago with EF of 40 to 45% -She has recently been taken off all home antihypertensive medication except hydralazine due to borderline low blood pressure -Follows with Dr. Sallyanne Kuster P: -Continue supportive care -Coreg d/ced -Continue hydralazine as needed for SBP greater than 170 -labetelol gtt Starting Hydralazine po q 6 On lipitor  NTG paste since 3/26  Hyperglycemia, now controlled. DM2 with a1c 7.4 -Continue Lantus 20 units twice daily -Continue tube feeding coverage-NovoLog 8 units every 4 hours -Accu-Cheks every 4 hours with sliding scale insulin as needed -Goal BG 140-180 while admitted to the ICU  Anemia:  [place] Transfuse for Hb <7  F/E/N: P: -Continue tube feeds -Continue to monitor electrolytes  Hypokalemia- resolved Unlikely the sole cause for prolonged QTC given it's persistence after correction of electrolytes. PTA on phenergan. -Qtc 651; 426ms on 3/24 -Per cardiology note she has a history of prolonged QT interval, usually 480-510 ms -Last stress test in 2018 did not show evidence of ischemia she does not have a history of ventricular arrhythmia P: -Continue avoiding all QTC prolonging medications  ESRD -T/TH/sat schedule -Per family it sounds like she completed most of  her dialysis 3/20 P: -Appreciate nephrology's assistance. Last HD 3/25     Best practice:  Diet: TF Pain/Anxiety/Delirium protocol (if indicated): Fentanyl propofol VAP protocol (if indicated): HOB 30 degrees, daily SBT as sedation will allow DVT prophylaxis: Heparin GI prophylaxis: Protonix Glucose control: SSI Mobility: Bedrest Code Status: Full code   Family Communication: daughter Almon Register Disposition: ICU  Labs   CBC: Recent  Labs  Lab 12/29/2019 2007 01/02/2020 2015 12/31/19 1758 01/01/20 0401 01/01/20 0432 01/01/20 0432 01/01/20 1029 01/02/20 0355 01/03/20 0350 01/04/20 0729 01/06/20 0533  WBC 12.3*   < > 4.6  --  7.4  --   --  16.6* 15.9* 17.0* 9.8  NEUTROABS 8.5*  --  3.2  --   --   --   --   --  14.1*  --   --   HGB 7.1*   < > 7.8*   < > 9.0*   < > 8.5* 8.5* 9.2* 8.8* 7.7*  HCT 23.6*   < > 23.7*   < > 27.5*   < > 25.0* 27.0* 28.2* 27.1* 23.3*  MCV 97.5   < > 89.8  --  90.5  --   --  94.7 91.9 89.4 89.6  PLT 213   < > 155  --  175  --   --  332 240 257 242   < > = values in this interval not displayed.    Basic Metabolic Panel: Recent Labs  Lab 01/01/20 0202 01/01/20 0401 01/01/20 1132 01/01/20 1342 01/01/20 1626 01/01/20 1810 01/02/20 0355 01/03/20 0350 01/04/20 0702 01/05/20 0500 01/06/20 0533  NA 136   < >   < >  --  136   < > 136 138 137 136 132*  K 4.5   < >   < >  --  5.1   < > 5.4* 3.8 4.1 4.6 5.3*  CL 105   < >   < >  --  105   < > 106 102 100 97* 93*  CO2 19*   < >   < >  --  22   < > 20* 24 25 25 23   GLUCOSE 140*   < >   < >  --  210*   < > 313* 215* 81 122* 287*  BUN 23   < >   < >  --  32*   < > 45* 41* 68* 50* 78*  CREATININE 2.87*   < >   < >  --  3.42*   < > 4.25* 3.33* 4.38* 3.47* 4.14*  CALCIUM 7.5*   < >   < >  --  7.4*   < > 7.4* 7.2* 7.7* 7.9* 8.1*  MG 1.9  --   --  2.0 2.0  --  2.1 1.9  --   --   --   PHOS 4.9*   < >  --  6.9* 7.2*  --  7.3* 3.1 2.7  --   --    < > = values in this interval not displayed.   GFR: Estimated Creatinine Clearance: 13.8 mL/min (A) (by C-G formula based on SCr of 4.14 mg/dL (H)). Recent Labs  Lab 01/02/2020 2007 01/10/2020 2007 12/19/2019 2243 12/31/19 0230 12/31/19 8676 01/01/20 0202 01/01/20 0432 01/02/20 0355 01/03/20 0350 01/04/20 0729 01/06/20 0533  PROCALCITON  --   --   --  9.78  --  24.23  --  21.64  --   --   --  WBC 12.3*   < >  --  8.4   < >  --    < > 16.6* 15.9* 17.0* 9.8  LATICACIDVEN 6.2*  --  1.6  --   --   --   --    --   --   --   --    < > = values in this interval not displayed.    Liver Function Tests: Recent Labs  Lab 12/31/19 1353 12/31/19 1353 01/01/20 0202 01/02/20 0355 01/04/20 0702 01/05/20 0500 01/06/20 0533  AST 53*  --  50* 40  --  29 30  ALT 30  --  33 29  --  21 21  ALKPHOS 52  --  51 61  --  82 81  BILITOT 0.6  --  0.5 0.4  --  0.7 0.6  PROT 4.3*  --  4.5* 4.8*  --  5.1* 4.7*  ALBUMIN 1.3*   < > 1.3* 1.5* 1.2* 1.3* 1.2*   < > = values in this interval not displayed.   No results for input(s): LIPASE, AMYLASE in the last 168 hours. No results for input(s): AMMONIA in the last 168 hours.  ABG    Component Value Date/Time   PHART 7.339 (L) 01/01/2020 1029   PCO2ART 40.4 01/01/2020 1029   PO2ART 99.0 01/01/2020 1029   HCO3 21.7 01/01/2020 1029   TCO2 23 01/01/2020 1029   ACIDBASEDEF 4.0 (H) 01/01/2020 1029   O2SAT 97.0 01/01/2020 1029     Coagulation Profile: Recent Labs  Lab 12/13/2019 2007 12/31/19 0433 12/31/19 1140 01/01/20 0432  INR 1.1 1.1 1.2 1.1    Cardiac Enzymes: No results for input(s): CKTOTAL, CKMB, CKMBINDEX, TROPONINI in the last 168 hours.  HbA1C: HbA1c, POC (prediabetic range)  Date/Time Value Ref Range Status  10/18/2018 10:39 AM 6.0 5.7 - 6.4 % Final   Hgb A1c MFr Bld  Date/Time Value Ref Range Status  11/20/2019 01:00 PM 7.4 (H) 4.8 - 5.6 % Final    Comment:    (NOTE)         Prediabetes: 5.7 - 6.4         Diabetes: >6.4         Glycemic control for adults with diabetes: <7.0   06/16/2019 09:20 PM 7.0 (H) 4.8 - 5.6 % Final    Comment:    (NOTE) Pre diabetes:          5.7%-6.4% Diabetes:              >6.4% Glycemic control for   <7.0% adults with diabetes     CBG: Recent Labs  Lab 01/05/20 1626 01/05/20 1917 01/05/20 2310 01/06/20 0511 01/06/20 0728  GLUCAP 145* 119* 131* 276* 249*    This patient is critically ill with multiple organ system failure which requires frequent high complexity decision making,  assessment, support, evaluation, and titration of therapies. This was completed through the application of advanced monitoring technologies and extensive interpretation of multiple databases. During this encounter critical care time was devoted to patient care services described in this note for 36 minutes.    Collier Bullock, MD 01/06/20 8:08 AM [facility]

## 2020-01-06 NOTE — Progress Notes (Signed)
Patient ID: Patricia [name], female   DOB: 1956/05/03, 64 y.o.   MRN: [id number]  [facility] Progress Note   Assessment/ Plan:   1.  Cardiac arrest/acute respiratory failure: On PSV.  Neuro status not improving but does open eyes spontaneously; per neuro and CCM; grim prognosis 2. ESRD: Continue hemodialysis on TTS schedule: 3.5h, TDC, 400/800, 2K, Tight Heparin, 2L UF 3. Anemia: Hb 7.7  Ferritin elevated from #5.  ESA qSat increase dose 4. CKD-MBD: Enteral nutrition. while intubated, calcium and phosphorus levels at goal. 5.  COVID-19 infection: Incidental finding and currently on management with Decadron and remdesivir. Per CCM  Subjective:   No interval change.    Objective:   BP (!) 151/69 (BP Location: Left Arm)   Pulse 82   Temp 97.8 F (36.6 C) (Axillary)   Resp 11   Ht 5' 4"  (1.626 m)   Wt 71.2 kg   SpO2 100%   BMI 26.94 kg/m   Physical Exam: Gen: Intubated, resting comfortably CVS: Pulse regular rhythm, normal rate Resp: Anteriorly clear to auscultation, no distinct rales or rhonchi.  Right IJ TDC Abd: Soft, flat, nontender Ext: No LEE  Labs: BMET Recent Labs  Lab 12/31/19 1353 12/31/19 2008 01/01/20 0202 01/01/20 0401 01/01/20 1132 01/01/20 1342 01/01/20 1626 01/01/20 1810 01/02/20 0000 01/02/20 0200 01/02/20 0355 01/03/20 0350 01/04/20 0702 01/05/20 0500 01/06/20 0533  NA 136   < > 136   < >   < >  --  136   < > 136 137 136 138 137 136 132*  K 4.0   < > 4.5   < >   < >  --  5.1   < > 5.6* 5.5* 5.4* 3.8 4.1 4.6 5.3*  CL 104   < > 105   < >   < >  --  105   < > 104 105 106 102 100 97* 93*  CO2 22   < > 19*   < >   < >  --  22   < > 20* 20* 20* 24 25 25 23   GLUCOSE 157*   < > 140*   < >   < >  --  210*   < > 299* 309* 313* 215* 81 122* 287*  BUN 18   < > 23   < >   < >  --  32*   < > 41* 43* 45* 41* 68* 50* 78*  CREATININE 2.62*   < > 2.87*   < >   < >  --  3.42*   < > 4.03* 4.21* 4.25* 3.33* 4.38* 3.47* 4.14*  CALCIUM 7.2*   < > 7.5*    < >   < >  --  7.4*   < > 7.5* 7.4* 7.4* 7.2* 7.7* 7.9* 8.1*  PHOS 4.0  --  4.9*  --   --  6.9* 7.2*  --   --   --  7.3* 3.1 2.7  --   --    < > = values in this interval not displayed.   CBC Recent Labs  Lab 12/24/2019 2007 12/24/2019 2015 12/31/19 1758 01/01/20 0401 01/02/20 0355 01/03/20 0350 01/04/20 0729 01/06/20 0533  WBC 12.3*   < > 4.6   < > 16.6* 15.9* 17.0* 9.8  NEUTROABS 8.5*  --  3.2  --   --  14.1*  --   --   HGB 7.1*   < > 7.8*   < >  8.5* 9.2* 8.8* 7.7*  HCT 23.6*   < > 23.7*   < > 27.0* 28.2* 27.1* 23.3*  MCV 97.5   < > 89.8   < > 94.7 91.9 89.4 89.6  PLT 213   < > 155   < > 332 240 257 242   < > = values in this interval not displayed.      Medications:    . aspirin  81 mg Per Tube Daily  . atorvastatin  40 mg Oral q1800  . chlorhexidine gluconate (MEDLINE KIT)  15 mL Mouth Rinse BID  . Chlorhexidine Gluconate Cloth  6 each Topical Daily  . Chlorhexidine Gluconate Cloth  6 each Topical Q0600  . darbepoetin (ARANESP) injection - DIALYSIS  100 mcg Intravenous Q Sat-HD  . dexamethasone (DECADRON) injection  6 mg Intravenous Q24H  . feeding supplement (PRO-STAT SUGAR FREE 64)  30 mL Per Tube BID  . heparin injection (subcutaneous)  5,000 Units Subcutaneous Q8H  . hydrALAZINE  25 mg Per Tube BID  . insulin aspart  0-24 Units Subcutaneous Q4H  . insulin aspart  8 Units Subcutaneous Q4H  . insulin glargine  20 Units Subcutaneous BID  . mouth rinse  15 mL Mouth Rinse 10 times per day  . multivitamin  1 tablet Per Tube QHS  . nitroGLYCERIN  1 inch Topical Q6H  . pantoprazole (PROTONIX) IV  40 mg Intravenous QHS   Rexene Agent , MD 01/06/2020, 8:53 AM

## 2020-01-07 ENCOUNTER — Inpatient Hospital Stay (HOSPITAL_COMMUNITY): Payer: Medicare HMO

## 2020-01-07 LAB — COMPREHENSIVE METABOLIC PANEL
ALT: 20 U/L (ref 0–44)
ALT: 1089 U/L — ABNORMAL HIGH (ref 0–44)
AST: 28 U/L (ref 15–41)
AST: 1652 U/L — ABNORMAL HIGH (ref 15–41)
Albumin: 1.2 g/dL — ABNORMAL LOW (ref 3.5–5.0)
Albumin: <1 g/dL — ABNORMAL LOW (ref 3.5–5.0)
Alkaline Phosphatase: 80 U/L (ref 38–126)
Alkaline Phosphatase: 71 U/L (ref 38–126)
Anion gap: 12 (ref 5–15)
Anion gap: 21 — ABNORMAL HIGH (ref 5–15)
BUN: 46 mg/dL — ABNORMAL HIGH (ref 8–23)
BUN: 56 mg/dL — ABNORMAL HIGH (ref 8–23)
CO2: 24 mmol/L (ref 22–32)
CO2: 15 mmol/L — ABNORMAL LOW (ref 22–32)
Calcium: 7.8 mg/dL — ABNORMAL LOW (ref 8.9–10.3)
Calcium: 7.3 mg/dL — ABNORMAL LOW (ref 8.9–10.3)
Chloride: 97 mmol/L — ABNORMAL LOW (ref 98–111)
Chloride: 102 mmol/L (ref 98–111)
Creatinine, Ser: 2.74 mg/dL — ABNORMAL HIGH (ref 0.44–1.00)
Creatinine, Ser: 3.28 mg/dL — ABNORMAL HIGH (ref 0.44–1.00)
GFR calc Af Amer: 21 mL/min — ABNORMAL LOW (ref >60)
GFR calc Af Amer: 17 mL/min — ABNORMAL LOW (ref >60)
GFR calc non Af Amer: 18 mL/min — ABNORMAL LOW (ref >60)
GFR calc non Af Amer: 14 mL/min — ABNORMAL LOW (ref >60)
Glucose, Bld: 223 mg/dL — ABNORMAL HIGH (ref 70–99)
Glucose, Bld: 222 mg/dL — ABNORMAL HIGH (ref 70–99)
Potassium: 5 mmol/L (ref 3.5–5.1)
Potassium: 5.3 mmol/L — ABNORMAL HIGH (ref 3.5–5.1)
Sodium: 133 mmol/L — ABNORMAL LOW (ref 135–145)
Sodium: 138 mmol/L (ref 135–145)
Total Bilirubin: 0.6 mg/dL (ref 0.3–1.2)
Total Bilirubin: 0.7 mg/dL (ref 0.3–1.2)
Total Protein: 4.7 g/dL — ABNORMAL LOW (ref 6.5–8.1)
Total Protein: 3.6 g/dL — ABNORMAL LOW (ref 6.5–8.1)

## 2020-01-07 LAB — PROTIME-INR
INR: 2 — ABNORMAL HIGH (ref 0.8–1.2)
Prothrombin Time: 22.3 seconds — ABNORMAL HIGH (ref 11.4–15.2)

## 2020-01-07 LAB — CBC
HCT: 22.6 % — ABNORMAL LOW (ref 36.0–46.0)
HCT: 14.8 % — ABNORMAL LOW (ref 36.0–46.0)
Hemoglobin: 7.5 g/dL — ABNORMAL LOW (ref 12.0–15.0)
Hemoglobin: 4.7 g/dL — CL (ref 12.0–15.0)
MCH: 29.4 pg (ref 26.0–34.0)
MCH: 30.1 pg (ref 26.0–34.0)
MCHC: 33.2 g/dL (ref 30.0–36.0)
MCHC: 31.8 g/dL (ref 30.0–36.0)
MCV: 88.6 fL (ref 80.0–100.0)
MCV: 94.9 fL (ref 80.0–100.0)
Platelets: 250 10*3/uL (ref 150–400)
Platelets: 223 10*3/uL (ref 150–400)
RBC: 2.55 MIL/uL — ABNORMAL LOW (ref 3.87–5.11)
RBC: 1.56 MIL/uL — ABNORMAL LOW (ref 3.87–5.11)
RDW: 18.4 % — ABNORMAL HIGH (ref 11.5–15.5)
RDW: 19.6 % — ABNORMAL HIGH (ref 11.5–15.5)
WBC: 15 10*3/uL — ABNORMAL HIGH (ref 4.0–10.5)
WBC: 34.9 10*3/uL — ABNORMAL HIGH (ref 4.0–10.5)
nRBC: 0.3 % — ABNORMAL HIGH (ref 0.0–0.2)
nRBC: 3.7 % — ABNORMAL HIGH (ref 0.0–0.2)

## 2020-01-07 LAB — GLUCOSE, CAPILLARY
Glucose-Capillary: 181 mg/dL — ABNORMAL HIGH (ref 70–99)
Glucose-Capillary: 169 mg/dL — ABNORMAL HIGH (ref 70–99)
Glucose-Capillary: 295 mg/dL — ABNORMAL HIGH (ref 70–99)
Glucose-Capillary: 300 mg/dL — ABNORMAL HIGH (ref 70–99)
Glucose-Capillary: 183 mg/dL — ABNORMAL HIGH (ref 70–99)
Glucose-Capillary: 126 mg/dL — ABNORMAL HIGH (ref 70–99)
Glucose-Capillary: 24 mg/dL — CL (ref 70–99)

## 2020-01-07 LAB — HEMOGLOBIN AND HEMATOCRIT, BLOOD
HCT: 29.2 % — ABNORMAL LOW (ref 36.0–46.0)
Hemoglobin: 10.1 g/dL — ABNORMAL LOW (ref 12.0–15.0)

## 2020-01-07 LAB — TROPONIN I (HIGH SENSITIVITY): Troponin I (High Sensitivity): 36 ng/L — ABNORMAL HIGH (ref <18)

## 2020-01-07 LAB — PREPARE RBC (CROSSMATCH)

## 2020-01-07 MED ORDER — HYDROCORTISONE NA SUCCINATE PF 100 MG IJ SOLR
50.0000 mg | Freq: Four times a day (QID) | INTRAMUSCULAR | Status: DC
  Administered 2020-01-07 – 2020-01-10 (×12): 50 mg via INTRAVENOUS
  Filled 2020-01-07 (×12): qty 2

## 2020-01-07 MED ORDER — VANCOMYCIN HCL 1500 MG/300ML IV SOLN
1500.0000 mg | Freq: Once | INTRAVENOUS | Status: AC
  Administered 2020-01-07: 1500 mg via INTRAVENOUS
  Filled 2020-01-07: qty 300

## 2020-01-07 MED ORDER — SODIUM CHLORIDE 0.9 % IV SOLN
500.0000 mg | Freq: Every day | INTRAVENOUS | Status: DC
  Administered 2020-01-07 – 2020-01-08 (×2): 500 mg via INTRAVENOUS
  Filled 2020-01-07 (×3): qty 0.5

## 2020-01-07 MED ORDER — HYDRALAZINE HCL 10 MG PO TABS
10.0000 mg | ORAL_TABLET | Freq: Four times a day (QID) | ORAL | Status: DC | PRN
  Administered 2020-01-09 – 2020-01-14 (×9): 10 mg via ORAL
  Filled 2020-01-07 (×13): qty 1

## 2020-01-07 MED ORDER — SODIUM CHLORIDE 0.9% IV SOLUTION: Freq: Once | INTRAVENOUS | Status: AC

## 2020-01-07 MED ORDER — PHENYLEPHRINE HCL-NACL 10-0.9 MG/250ML-% IV SOLN
0.0000 ug/min | INTRAVENOUS | Status: DC
  Administered 2020-01-07: 190 ug/min via INTRAVENOUS
  Administered 2020-01-07: 0 ug/min via INTRAVENOUS
  Administered 2020-01-07: 20 ug/min via INTRAVENOUS
  Administered 2020-01-07 (×2): 13.333 ug/min via INTRAVENOUS
  Filled 2020-01-07 (×5): qty 250

## 2020-01-07 MED ORDER — EPINEPHRINE HCL 5 MG/250ML IV SOLN IN NS
0.5000 ug/min | INTRAVENOUS | Status: DC
  Administered 2020-01-07: 15:00:00 2 ug/min via INTRAVENOUS
  Administered 2020-01-08: 6.5 ug/min via INTRAVENOUS
  Filled 2020-01-07 (×2): qty 250

## 2020-01-07 MED ORDER — VANCOMYCIN HCL IN DEXTROSE 750-5 MG/150ML-% IV SOLN
750.0000 mg | INTRAVENOUS | Status: DC
  Administered 2020-01-09: 750 mg via INTRAVENOUS
  Filled 2020-01-07: qty 150

## 2020-01-07 MED ORDER — NOREPINEPHRINE 16 MG/250ML-% IV SOLN
0.0000 ug/min | INTRAVENOUS | Status: DC
  Administered 2020-01-07: 20 ug/min via INTRAVENOUS
  Administered 2020-01-07: 42 ug/min via INTRAVENOUS
  Administered 2020-01-08: 25 ug/min via INTRAVENOUS
  Filled 2020-01-07 (×3): qty 250

## 2020-01-07 MED ORDER — VASOPRESSIN 20 UNIT/ML IV SOLN
0.0300 [IU]/min | INTRAVENOUS | Status: DC
  Administered 2020-01-07 – 2020-01-08 (×2): 0.03 [IU]/min via INTRAVENOUS
  Filled 2020-01-07 (×3): qty 2

## 2020-01-07 MED ORDER — DEXTROSE 50 % IV SOLN
25.0000 g | INTRAVENOUS | Status: AC
  Administered 2020-01-07: 25 g via INTRAVENOUS

## 2020-01-07 MED ORDER — DEXTROSE 50 % IV SOLN
INTRAVENOUS | Status: AC
  Administered 2020-01-08: 25 g via INTRAVENOUS
  Filled 2020-01-07: qty 100

## 2020-01-07 NOTE — Progress Notes (Signed)
Pharmacy Antibiotic Note  Patricia [name] is a 64 y.o. female admitted on 01/08/2020 with sepsis.  Pharmacy has been consulted for meropenem and vancomycin dosing.  Pt is ESRD with HD on TTS.  Plan: Vancomycin 1500 mg IV x 1, then 750 mg after each HD session. Meropenem 500 mg q 24 hrs. F/u cultures, HD schedule and clinical course.  Height: 5\' 4"  (162.6 cm) Weight: 149 lb 0.5 oz (67.6 kg) IBW/kg (Calculated) : 54.7  Temp (24hrs), Avg:97.5 F (36.4 C), Min:97.2 F (36.2 C), Max:98.1 F (36.7 C)  Recent Labs  Lab 01/03/20 0350 01/04/20 0702 01/04/20 0729 01/05/20 0500 01/06/20 0533 01/07/20 0255 01/07/20 1520  WBC 15.9*  --  17.0*  --  9.8 15.0* PENDING  CREATININE 3.33* 4.38*  --  3.47* 4.14* 2.74*  --     Estimated Creatinine Clearance: 20.4 mL/min (A) (by C-G formula based on SCr of 2.74 mg/dL (H)).    Allergies  Allergen Reactions  . Codone [Hydrocodone] Itching  . Dextromethorphan-Guaifenesin Nausea And Vomiting  . Norvasc [Amlodipine Besylate] Swelling    Lower extremity  . Hydralazine Swelling and Other (See Comments)    Leg swelling. Pt takes this as o/p. Patient can take PO now  . Sulfa Antibiotics Itching and Rash    Antimicrobials this admission: Vanc 3/20>>3/22; 3/28 Cefepime 3/20>>3/22 Zosyn 3/22>> Flagyl x 1 3/20 Remdesivir 3/21>>  Dose adjustments this admission:  Microbiology results: 3/20 COVID + 3/20 BCx: ngtd 3/21 MRSA: neg 3/23 resp enterobacter r to cefaz   Thank you for allowing pharmacy to be a part of this patient's care.  Marguerite Olea, [facility] Clinical Pharmacist Phone [phone]  01/07/2020 3:45 PM

## 2020-01-07 NOTE — Progress Notes (Signed)
Assisted tele visit to patient with family member.  Dredyn Gubbels M, RN  

## 2020-01-07 NOTE — Progress Notes (Signed)
Patient ID: Patricia [name], female   DOB: 06-23-1956, 64 y.o.   MRN: [id number]  [facility] Progress Note   Assessment/ Plan:   1.  Cardiac arrest/acute respiratory failure/ HIE: Neuro status not improving but does open eyes spontaneously; per neuro and CCM; grim prognosis 2. ESRD: Cont hemodialysis on TTS schedule: 3.5h, TDC, 400/800, 2K, Tight Heparin, 2L UF 3. Anemia: Hb 7.5  Ferritin elevated from #5.  ESA qSat and recent increase dose 4. CKD-MBD: Enteral nutrition. while intubated, calcium and phosphorus levels at goal. 5.  COVID-19 infection: Incidental finding and currently on management with Decadron and s/p remdesivir. Per CCM  Subjective:   No interval change.  HD yesterday UF lmited by IDH, 0.8L total CCM and Neuro note reviewed Labs reviewed    Objective:   BP (!) 112/52   Pulse 94   Temp (!) 97.2 F (36.2 C) (Axillary)   Resp (!) 22   Ht 5' 4"  (1.626 m)   Wt 67.6 kg   SpO2 100%   BMI 25.58 kg/m   Physical Exam: Pt not examined today given COVD19 status and no active HD issues  Labs: BMET Recent Labs  Lab 12/31/19 1353 12/31/19 2008 01/01/20 0202 01/01/20 0401 01/01/20 1132 01/01/20 1342 01/01/20 1626 01/01/20 1810 01/02/20 0200 01/02/20 0200 01/02/20 0355 01/03/20 0350 01/04/20 0702 01/05/20 0500 01/06/20 0533 01/06/20 1158 01/07/20 0255  NA 136   < > 136   < >   < >  --  136   < > 137   < > 136 138 137 136 132* 138 133*  K 4.0   < > 4.5   < >   < >  --  5.1   < > 5.5*   < > 5.4* 3.8 4.1 4.6 5.3* 3.1* 5.0  CL 104   < > 105   < >   < >  --  105   < > 105  --  106 102 100 97* 93*  --  97*  CO2 22   < > 19*   < >   < >  --  22   < > 20*  --  20* 24 25 25 23   --  24  GLUCOSE 157*   < > 140*   < >   < >  --  210*   < > 309*  --  313* 215* 81 122* 287*  --  223*  BUN 18   < > 23   < >   < >  --  32*   < > 43*  --  45* 41* 68* 50* 78*  --  46*  CREATININE 2.62*   < > 2.87*   < >   < >  --  3.42*   < > 4.21*  --  4.25* 3.33* 4.38* 3.47*  4.14*  --  2.74*  CALCIUM 7.2*   < > 7.5*   < >   < >  --  7.4*   < > 7.4*  --  7.4* 7.2* 7.7* 7.9* 8.1*  --  7.8*  PHOS 4.0  --  4.9*  --   --  6.9* 7.2*  --   --   --  7.3* 3.1 2.7  --   --   --   --    < > = values in this interval not displayed.   CBC Recent Labs  Lab 12/31/19 1758 01/01/20 0401 01/03/20 0350 01/03/20 0350 01/04/20 0865  01/06/20 0533 01/06/20 1158 01/07/20 0255  WBC 4.6   < > 15.9*  --  17.0* 9.8  --  15.0*  NEUTROABS 3.2  --  14.1*  --   --   --   --   --   HGB 7.8*   < > 9.2*   < > 8.8* 7.7* 8.2* 7.5*  HCT 23.7*   < > 28.2*   < > 27.1* 23.3* 24.0* 22.6*  MCV 89.8   < > 91.9  --  89.4 89.6  --  88.6  PLT 155   < > 240  --  257 242  --  250   < > = values in this interval not displayed.      Medications:    . aspirin  81 mg Per Tube Daily  . atorvastatin  40 mg Oral q1800  . chlorhexidine gluconate (MEDLINE KIT)  15 mL Mouth Rinse BID  . Chlorhexidine Gluconate Cloth  6 each Topical Daily  . Chlorhexidine Gluconate Cloth  6 each Topical Q0600  . [START ON 01/13/2020] darbepoetin (ARANESP) injection - DIALYSIS  200 mcg Intravenous Q Sat-HD  . dexamethasone (DECADRON) injection  6 mg Intravenous Q24H  . feeding supplement (PRO-STAT SUGAR FREE 64)  30 mL Per Tube BID  . heparin injection (subcutaneous)  7,500 Units Subcutaneous Q8H  . insulin aspart  0-24 Units Subcutaneous Q4H  . insulin aspart  8 Units Subcutaneous Q4H  . insulin glargine  20 Units Subcutaneous BID  . mouth rinse  15 mL Mouth Rinse 10 times per day  . multivitamin  1 tablet Per Tube QHS  . nitroGLYCERIN  1 inch Topical Q6H  . pantoprazole (PROTONIX) IV  40 mg Intravenous QHS   Rexene Agent , MD 01/07/2020, 11:12 AM

## 2020-01-07 NOTE — Progress Notes (Signed)
Sudden hypotension, starting around 1230, progressively worsening over several hours.  Dropped precipitously at around 230, followed by PEA (?) cardiac arrest.   Given Epi 1mg  x 3, bicarb push, then found to be in Vfib, cardioverted to sinus.   Episode of vomiting this am, small ,but apparently had been tolerating tube feeds.  Large emesis at time of code.   Epi, levophed, and neo and vaspressin infusions.  ABG looks good.  Trop only minimally elevated prior to code.  Echo: no effusion, ef poor.   Chemistry with elevated LFTs (shock liver), low Alb, worsening metabolic acidosis (likely lactic after code), WBC increased, HB dropped to 4.7. No obvious source of bleeding.  No abd distention, no GI bleeding (rectal tube in, NG tube to suction).  Will transfuse, broaden abtx, reculture, stress steroids.  Called family about poor prognosis, high pressor needs, possibility of recurrent cardiac arrest.   They will come to visit, given likely end of life.  Anoxic brain injury, very poor prognosis.  Comfort care would be most appropriate here.

## 2020-01-07 NOTE — Procedures (Signed)
.  Central Venous Catheter Insertion Procedure Note DAVON FOLTA [id number] Oct 29, 1955  Procedure: Insertion of Central Venous Catheter Indications: Drug and/or fluid administration  Procedure Details Consent: Risks of procedure as well as the alternatives and risks of each were explained to the (patient/caregiver).  Consent for procedure obtained. From Daughter Almon Register Time Out: Verified patient identification, verified procedure, site/side was marked, verified correct patient position, special equipment/implants available, medications/allergies/relevent history reviewed, required imaging and test results available.  Performed  Maximum sterile technique was used including antiseptics, cap, gloves, gown, hand hygiene, mask and sheet. Skin prep: Chlorhexidine; local anesthetic administered A antimicrobial bonded/coated triple lumen catheter was placed in the right internal jugular vein using the Seldinger technique.  Evaluation Blood flow good Complications: No apparent complications Patient did tolerate procedure well. Chest X-ray ordered to verify placement.  CXR: pending.  Collier Bullock 01/07/2020, 7:17 PM

## 2020-01-07 NOTE — Progress Notes (Signed)
NAME:  Patricia [name], MRN:  [id number], DOB:  [birth year], LOS: 8 ADMISSION DATE:  12/29/2019, CONSULTATION DATE:  01/07/20 REFERRING MD:  Regenia Skeeter CHIEF COMPLAINT:  Cardiac Arrest   Brief History   64 y.o. F with PMH ESRD, anemia, HFrEF, Depression, Type 2 DM, HTN and obesity who presented in cardiac arrest.  Pt was in her usual state of health and went to dialysis 3/20 where she became nauseated and vomited, at home she had a syncopal episode and collapse. CPR initiated and ROSC after 15 mins.    History of present illness   Patricia [name] is a 64 y.o. F with PMH of  ESRD, anemia, HFrEF, Depression, Type 2 DM, HTN and obesity who presented in cardiac arrest. She lives with family and they state she has been in her usual state of health and went to dialysis today where she had an episode of nausea and vomiting and returned home.  There she became sweaty and collapsed, family started CPR immediately and called EMS who performed 15 minutes of CPR with ROSC, no mention of shockable rhythm.    In the ED, pt was not initially responsive and was intubated.  CT head was without acute findings, patient was hypertensive and febrile with a potassium of 2.8 and prolonged QTC.  No sign of STEMI on EKG.  Lactic acid was elevated and chest x-ray showed vascular congestion.  She was given 2 g of magnesium, broad-spectrum antibiotics and IV fluids.  Covid-19 resulted positive.  PCCM consulted for admission.  Family states that she has showed no symptoms of Covid, and they are unsure how she may have contracted this.  Past Medical History   has a past medical history of Allergy, Anemia, Anxiety, Blood transfusion without reported diagnosis, Cardiomyopathy ([place]) (11/2016), CHF (congestive heart failure) ([place]), CKD (chronic kidney disease), stage IV ([place]), Depression, Diabetes mellitus without complication ([place]), Diverticulitis, Dyspnea, Hypertension, and Obesity.   Significant Hospital Events   3/20 admit to [facility]  3/21 TTM initiated to 33 3/22 completed TTM, rewarmed 3/27: hypertensive prior to HD.  Started hydral, had been on labetelol gtt.  3/28 AM briefly on levophed for hypotension.  Consults:  Nephrology  Procedures:  3/20 ET tube 3/20 CVC > 3/24  Significant Diagnostic Tests:  3/20 CT head>> no acute findings 3/21 EEG>> generalized background slowing, no epileptiform activity 3/21 echocardiogram-LVEF 30 to 35%, no regional wall motion abnormalities, moderate LVH.  Grade 1 diastolic dysfunction.  Mildly dilated LA, normal RV. 3/22 EEG-generalized background attenuation with generalized slowing c/w profound diffuse encephalopathy  3/24: MRI IMPRESSION: Small area of acute infarct in the right occipital cortex. This is more likely to be an embolus than anoxic brain injury.  Mild chronic microvascular ischemic changes in the white matter. Scattered areas of microhemorrhage in the brain correlate with hypertension history.   Micro Data:  3/20 SARS-Cov-2>> positive 3/22 sputum- GNRs> enterobacter cloacae (R cefazolin, otherwise sensitive)  Antimicrobials:  Cefepime 3/20- Flagyl 3/20 only Vancomycin 3/20-3/22 Zosyn 3/24>>  Interim history/subjective:  Still opening eyes to voice, not following commands.   Objective   Blood pressure (!) 112/52, pulse 96, temperature (!) 97.2 F (36.2 C), temperature source Axillary, resp. rate 20, height 5\' 4"  (1.626 m), weight 67.6 kg, SpO2 100 %.    Vent Mode: PSV;CPAP FiO2 (%):  [30 %] 30 % Set Rate:  [8 bmp] 8 bmp Vt Set:  [430 mL] 430 mL PEEP:  [5 cmH20] 5 cmH20 Pressure Support:  [5 cmH20] 5 cmH20  Plateau Pressure:  [13 cmH20-14 cmH20] 14 cmH20   Intake/Output Summary (Last 24 hours) at 01/07/2020 1051 Last data filed at 01/07/2020 0900 Gross per 24 hour  Intake 999.86 ml  Output 840 ml  Net 159.86 ml   Filed Weights   01/06/20 0936 01/06/20 1300 01/07/20 0500  Weight: 69 kg 67.8 kg 67.6 kg    General: critically ill  appearing woman laying in bed in NAD HEENT: [place]/AT, eyes anicteric, oral mucosa moist. OGT & ETT. Cardio: Regular rate and rhythm, no murmurs Respiratory: CTAB.  Abdomen: Soft, nontender, nondistended Extremities: No clubbing, cyanosis, or edema. Derm: No rashes or wounds. Neuro: not opening eyes to verbal stimuli or pain today.   Pupils reactive to light  Not withdrawing to pain in any extremity or trapezius squeeze bilaterally.  Not following commands.    Resolved Hospital Problem list     Assessment & Plan:   Acute cardiopulmonary arrest -Possibly related to electrolyte disturbance prolonged QTC worsening, heart failure.  no signs of acute ischemia on EKG and troponin mildly elevated, echocardiogram suggests against acute MI. -ROSC obtained after 15 minutes of CPR -CT head without acute findings P: -Continue daily aspirin -Continue to monitor electrolytes.  QTC improved previously. -Continue to maintain normothermia, euglycemia, head of bed greater than 30 degrees -Continue vent support -Avoid hypoxia and hypercapnia SBT trial today but mental status precludes extubation at this point.   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  Embolic?  No evidence of clot on Echo.  Consider TEE when more stable.   Neuro consult appreciated.  Microhemorrhages Carotid dopplers, lipid panel.   Acute respiratory failure requiring mechanical ventilation post cardiac arrest. LLL pneumonia due to Enterobacter likely from aspiration during cardiac arrest. -Continue low tidal volume ventilation, 4 to 8 cc/kg ideal body weight with goal plateau less than 30 and driving pressure less than 15.  At goal. -Continue daily SBT.  Continue to hold sedation. -VAP prevention protocol -Complete 7-day course of Zosyn for aspiration pneumonia  Anoxic encephalopathy -Completed TTM to 33 degrees -Continue to avoid all sedation.  Fentanyl as needed ordered if evidence of pain -on  review by neurology and radiology: anoxic injury seen on MRI.   Covid-19 infection -Patient with low-grade fever on arrival, no URI symptoms per family unclear if contributory at all to today's events P: -Continue dexamethasone per protocol (day 8).  Previously completed remdesivir. On heparin 7500 TID for ppx  HFrEF, hypertension.  Acutely worsened systolic function post-arrest. Bradycardia resolved post- rewarming. -Echocardiogram 1 year ago with EF of 40 to 45% -She has recently been taken off all home antihypertensive medication except hydralazine due to borderline low blood pressure -Follows with Dr. Sallyanne Kuster P: -Continue supportive care -Coreg d/ced, hydral d/ced.   -Continue hydralazine as needed for SBP greater than 170 On lipitor  NTG paste since 3/26  Hyperglycemia, now controlled. DM2 with a1c 7.4 -Continue Lantus 20 units twice daily -Continue tube feeding coverage-NovoLog 8 units every 4 hours -Accu-Cheks every 4 hours with sliding scale insulin as needed -Goal BG 140-180 while admitted to the ICU  Anemia:  Montior Transfuse for Hb <7  F/E/N: P: -Continue tube feeds -Continue to monitor electrolytes  Hypokalemia- resolved Unlikely the sole cause for prolonged QTC given it's persistence after correction of electrolytes. PTA on phenergan. -Qtc 651; 430ms on 3/24 -Per cardiology note she has a history of prolonged QT interval, usually 480-510 ms -Last stress test in 2018 did not show  evidence of ischemia she does not have a history of ventricular arrhythmia P: -Continue avoiding all QTC prolonging medications  ESRD -T/TH/sat schedule -Per family it sounds like she completed most of her dialysis 3/20 P: -Appreciate nephrology's assistance. Last HD 3/25     Best practice:  Diet: TF Pain/Anxiety/Delirium protocol (if indicated): Fentanyl propofol VAP protocol (if indicated): HOB 30 degrees, daily SBT as sedation will allow DVT prophylaxis: Heparin GI  prophylaxis: Protonix Glucose control: SSI Mobility: Bedrest Code Status: Full code   Family Communication: daughter Almon Register - still hoping for a miracle. Believes her mom will improve.  Disposition: ICU  Labs   CBC: Recent Labs  Lab 12/31/19 1758 01/01/20 0401 01/02/20 0355 01/02/20 0355 01/03/20 0350 01/04/20 0729 01/06/20 0533 01/06/20 1158 01/07/20 0255  WBC 4.6   < > 16.6*  --  15.9* 17.0* 9.8  --  15.0*  NEUTROABS 3.2  --   --   --  14.1*  --   --   --   --   HGB 7.8*   < > 8.5*   < > 9.2* 8.8* 7.7* 8.2* 7.5*  HCT 23.7*   < > 27.0*   < > 28.2* 27.1* 23.3* 24.0* 22.6*  MCV 89.8   < > 94.7  --  91.9 89.4 89.6  --  88.6  PLT 155   < > 332  --  240 257 242  --  250   < > = values in this interval not displayed.    Basic Metabolic Panel: Recent Labs  Lab 01/01/20 0202 01/01/20 0401 01/01/20 1132 01/01/20 1342 01/01/20 1626 01/01/20 1810 01/02/20 0355 01/02/20 0355 01/03/20 0350 01/03/20 0350 01/04/20 0702 01/05/20 0500 01/06/20 0533 01/06/20 1158 01/07/20 0255  NA 136   < >   < >  --  136   < > 136   < > 138   < > 137 136 132* 138 133*  K 4.5   < >   < >  --  5.1   < > 5.4*   < > 3.8   < > 4.1 4.6 5.3* 3.1* 5.0  CL 105   < >   < >  --  105   < > 106   < > 102  --  100 97* 93*  --  97*  CO2 19*   < >   < >  --  22   < > 20*   < > 24  --  25 25 23   --  24  GLUCOSE 140*   < >   < >  --  210*   < > 313*   < > 215*  --  81 122* 287*  --  223*  BUN 23   < >   < >  --  32*   < > 45*   < > 41*  --  68* 50* 78*  --  46*  CREATININE 2.87*   < >   < >  --  3.42*   < > 4.25*   < > 3.33*  --  4.38* 3.47* 4.14*  --  2.74*  CALCIUM 7.5*   < >   < >  --  7.4*   < > 7.4*   < > 7.2*  --  7.7* 7.9* 8.1*  --  7.8*  MG 1.9  --   --  2.0 2.0  --  2.1  --  1.9  --   --   --   --   --   --  PHOS 4.9*   < >  --  6.9* 7.2*  --  7.3*  --  3.1  --  2.7  --   --   --   --    < > = values in this interval not displayed.   GFR: Estimated Creatinine Clearance: 20.4 mL/min (A) (by C-G  formula based on SCr of 2.74 mg/dL (H)). Recent Labs  Lab 01/01/20 0202 01/01/20 0432 01/02/20 0355 01/02/20 0355 01/03/20 0350 01/04/20 0729 01/06/20 0533 01/07/20 0255  PROCALCITON 24.23  --  21.64  --   --   --   --   --   WBC  --    < > 16.6*   < > 15.9* 17.0* 9.8 15.0*   < > = values in this interval not displayed.    Liver Function Tests: Recent Labs  Lab 01/01/20 0202 01/01/20 0202 01/02/20 0355 01/04/20 0702 01/05/20 0500 01/06/20 0533 01/07/20 0255  AST 50*  --  40  --  29 30 28   ALT 33  --  29  --  21 21 20   ALKPHOS 51  --  61  --  82 81 80  BILITOT 0.5  --  0.4  --  0.7 0.6 0.6  PROT 4.5*  --  4.8*  --  5.1* 4.7* 4.7*  ALBUMIN 1.3*   < > 1.5* 1.2* 1.3* 1.2* 1.2*   < > = values in this interval not displayed.   No results for input(s): LIPASE, AMYLASE in the last 168 hours. No results for input(s): AMMONIA in the last 168 hours.  ABG    Component Value Date/Time   PHART 7.492 (H) 01/06/2020 1158   PCO2ART 35.2 01/06/2020 1158   PO2ART 123.0 (H) 01/06/2020 1158   HCO3 26.9 01/06/2020 1158   TCO2 28 01/06/2020 1158   ACIDBASEDEF 4.0 (H) 01/01/2020 1029   O2SAT 99.0 01/06/2020 1158     Coagulation Profile: Recent Labs  Lab 12/31/19 1140 01/01/20 0432  INR 1.2 1.1    Cardiac Enzymes: No results for input(s): CKTOTAL, CKMB, CKMBINDEX, TROPONINI in the last 168 hours.  HbA1C: HbA1c, POC (prediabetic range)  Date/Time Value Ref Range Status  10/18/2018 10:39 AM 6.0 5.7 - 6.4 % Final   Hgb A1c MFr Bld  Date/Time Value Ref Range Status  11/20/2019 01:00 PM 7.4 (H) 4.8 - 5.6 % Final    Comment:    (NOTE)         Prediabetes: 5.7 - 6.4         Diabetes: >6.4         Glycemic control for adults with diabetes: <7.0   06/16/2019 09:20 PM 7.0 (H) 4.8 - 5.6 % Final    Comment:    (NOTE) Pre diabetes:          5.7%-6.4% Diabetes:              >6.4% Glycemic control for   <7.0% adults with diabetes     CBG: Recent Labs  Lab 01/06/20 1818  01/06/20 1956 01/06/20 2305 01/07/20 0412 01/07/20 0805  GLUCAP 87 94 124* 181* 169*    This patient is critically ill with multiple organ system failure which requires frequent high complexity decision making, assessment, support, evaluation, and titration of therapies. This was completed through the application of advanced monitoring technologies and extensive interpretation of multiple databases. During this encounter critical care time was devoted to patient care services described in this note for 36 minutes.    Elmyra Ricks  Duwayne Heck, MD 01/07/20 10:51 AM [facility]

## 2020-01-07 NOTE — Progress Notes (Addendum)
[facility] Progress Note Patient Name: GRACIA SAGGESE DOB: June 09, 1956 MRN: [id number]   Date of Service  01/07/2020  HPI/Events of Note  E link was asked to review CXR for line and to follow up post transfusion CBC  eICU Interventions  CXR ok, line is in good position RN will call us with post transfusion CBC when done      Intervention Category Intermediate Interventions: Other:  Margaretmary Lombard 01/07/2020, 10:13 PM   Addendum Asked to re enter CXR order, done Hypoglycemia, needed D50, will hold lantus   Addendum at 11"10 pm - asked to enter order to [facility] use of CVC. Order placed

## 2020-01-07 NOTE — Progress Notes (Signed)
Neurology Progress Note   S:// Seen and examined.  No changes overnight.    O:// Current vital signs: BP (!) 112/52   Pulse 94   Temp (!) 97.2 F (36.2 C) (Axillary)   Resp (!) 22   Ht 5' 4"  (1.626 m)   Wt 67.6 kg   SpO2 100%   BMI 25.58 kg/m  Vital signs in last 24 hours: Temp:  [97.1 F (36.2 C)-98.1 F (36.7 C)] 97.2 F (36.2 C) (03/28 0800) Pulse Rate:  [72-96] 94 (03/28 1100) Resp:  [12-29] 22 (03/28 1100) BP: (98-175)/(49-84) 112/52 (03/28 1014) SpO2:  [100 %] 100 % (03/28 1122) Arterial Line BP: (98-193)/(42-73) 111/51 (03/28 1100) FiO2 (%):  [30 %] 30 % (03/28 1122) Weight:  [67.6 kg-67.8 kg] 67.6 kg (03/28 0500) Neurological exam Patient is not sedated, vented. There is no spontaneous movement. To noxious stimulation, she opens eyes.  She does not follow any commands. Cranial nerves: Pupils equal round reactive to light, she has roving eye movements.  She has corneal reflexes present bilaterally.  Facial symmetry is difficult ascertain due to the tube.  She has a cough and gag. Motor exam: No spontaneous movement and no withdrawal to noxious stimulation. Sensory exam: As above Coordination and gait can not be tested due to her mentation  Gen: WDWN HEENT: [place] AT CVS: RRR Resp: vented Abd: ND NT  Medications  Current Facility-Administered Medications:  .  aspirin chewable tablet 81 mg, 81 mg, Per Tube, Daily, Noemi Chapel P, DO, 81 mg at 01/07/20 1010 .  atorvastatin (LIPITOR) tablet 40 mg, 40 mg, Oral, q1800, Noemi Chapel P, DO, 40 mg at 01/06/20 1732 .  chlorhexidine gluconate (MEDLINE KIT) (PERIDEX) 0.12 % solution 15 mL, 15 mL, Mouth Rinse, BID, Ramaswamy, Murali, MD, 15 mL at 01/07/20 0811 .  Chlorhexidine Gluconate Cloth 2 % PADS 6 each, 6 each, Topical, Daily, Clydell Hakim, MD, 6 each at 01/06/20 2330 .  Chlorhexidine Gluconate Cloth 2 % PADS 6 each, 6 each, Topical, Q0600, Roney Jaffe, MD, 6 each at 01/06/20 2330 .  [START ON 01/13/2020]  Darbepoetin Alfa (ARANESP) injection 200 mcg, 200 mcg, Intravenous, Q Sat-HD, Noemi Chapel P, DO .  dexamethasone (DECADRON) injection 6 mg, 6 mg, Intravenous, Q24H, Gleason, Otilio Carpen, PA-C, 6 mg at 01/06/20 2153 .  feeding supplement (PRO-STAT SUGAR FREE 64) liquid 30 mL, 30 mL, Per Tube, BID, Clark, Laura P, DO, 30 mL at 01/07/20 1011 .  feeding supplement (VITAL 1.5 CAL) liquid 1,000 mL, 1,000 mL, Per Tube, Continuous, Julian Hy, DO, Last Rate: 40 mL/hr at 01/07/20 0900, Rate Verify at 01/07/20 0900 .  fentaNYL (SUBLIMAZE) injection 25 mcg, 25 mcg, Intravenous, Q15 min PRN, Noemi Chapel P, DO .  fentaNYL (SUBLIMAZE) injection 25-100 mcg, 25-100 mcg, Intravenous, Q30 min PRN, Julian Hy, DO, 100 mcg at 01/05/20 0359 .  heparin injection 2,700 Units, 40 Units/kg, Dialysis, PRN, Elmarie Shiley, MD, 2,700 Units at 01/06/20 0952 .  heparin injection 7,500 Units, 7,500 Units, Subcutaneous, Q8H, Collier Bullock, MD, 7,500 Units at 01/07/20 1012 .  hydrALAZINE (APRESOLINE) tablet 10 mg, 10 mg, Oral, Q6H PRN, Collier Bullock, MD .  insulin aspart (novoLOG) injection 0-24 Units, 0-24 Units, Subcutaneous, Q4H, Frederik Pear, MD, 4 Units at 01/07/20 0810 .  insulin aspart (novoLOG) injection 8 Units, 8 Units, Subcutaneous, Q4H, Julian Hy, DO, 8 Units at 01/07/20 0810 .  insulin glargine (LANTUS) injection 20 Units, 20 Units, Subcutaneous, BID, Clark, Laura P, DO, 20 Units  at 01/07/20 1011 .  labetalol (NORMODYNE) 500 mg in dextrose 5 % 125 mL (4 mg/mL) infusion, 0.5-3 mg/min, Intravenous, Titrated, Ogan, Kerry Kass, MD, Stopped at 01/06/20 2300 .  MEDLINE mouth rinse, 15 mL, Mouth Rinse, 10 times per day, Brand Males, MD, 15 mL at 01/07/20 1012 .  multivitamin (RENA-VIT) tablet 1 tablet, 1 tablet, Per Tube, QHS, Julian Hy, DO, 1 tablet at 01/06/20 2154 .  nitroGLYCERIN (NITROGLYN) 2 % ointment 1 inch, 1 inch, Topical, Q6H, Anders Simmonds, MD, 1 inch at 01/06/20 2307 .   norepinephrine (LEVOPHED) 58m in 2593mpremix infusion, 0-50 mcg/min, Intravenous, Titrated, ClJulian HyDO, Last Rate: 18.75 mL/hr at 01/07/20 0900, 5 mcg/min at 01/07/20 0900 .  pantoprazole (PROTONIX) injection 40 mg, 40 mg, Intravenous, QHS, Gleason, LaOtilio CarpenPA-C, 40 mg at 01/06/20 2153 .  piperacillin-tazobactam (ZOSYN) IVPB 3.375 g, 3.375 g, Intravenous, Q12H, ClJulian HyDO, Last Rate: 12.5 mL/hr at 01/07/20 1013, 3.375 g at 01/07/20 1013 Labs CBC    Component Value Date/Time   WBC 15.0 (H) 01/07/2020 0255   RBC 2.55 (L) 01/07/2020 0255   HGB 7.5 (L) 01/07/2020 0255   HGB 9.6 (L) 06/06/2019 1118   HCT 22.6 (L) 01/07/2020 0255   HCT 29.0 (L) 06/06/2019 1118   PLT 250 01/07/2020 0255   PLT 235 06/06/2019 1118   MCV 88.6 01/07/2020 0255   MCV 94 06/06/2019 1118   MCH 29.4 01/07/2020 0255   MCHC 33.2 01/07/2020 0255   RDW 18.4 (H) 01/07/2020 0255   RDW 15.1 06/06/2019 1118   LYMPHSABS 0.9 01/03/2020 0350   LYMPHSABS 2.4 01/04/2017 1220   MONOABS 0.7 01/03/2020 0350   EOSABS 0.0 01/03/2020 0350   EOSABS 0.1 01/04/2017 1220   BASOSABS 0.0 01/03/2020 0350   BASOSABS 0.0 01/04/2017 1220    CMP     Component Value Date/Time   NA 133 (L) 01/07/2020 0255   NA 143 05/23/2018 1607   K 5.0 01/07/2020 0255   CL 97 (L) 01/07/2020 0255   CO2 24 01/07/2020 0255   GLUCOSE 223 (H) 01/07/2020 0255   BUN 46 (H) 01/07/2020 0255   BUN 73 (H) 05/23/2018 1607   CREATININE 2.74 (H) 01/07/2020 0255   CREATININE 2.22 (H) 12/25/2016 1000   CALCIUM 7.8 (L) 01/07/2020 0255   CALCIUM 8.0 (L) 08/30/2018 1300   PROT 4.7 (L) 01/07/2020 0255   ALBUMIN 1.2 (L) 01/07/2020 0255   AST 28 01/07/2020 0255   ALT 20 01/07/2020 0255   ALKPHOS 80 01/07/2020 0255   BILITOT 0.6 01/07/2020 0255   GFRNONAA 18 (L) 01/07/2020 0255   GFRNONAA 24 (L) 12/25/2016 1000   GFRAA 21 (L) 01/07/2020 0255   GFRAA 27 (L) 12/25/2016 1000   EEGs negative for seizures-3/21, 3/22, 01/02/2020  Imaging I have  reviewed images in epic and the results pertinent to this consultation are: MRI brain that reveals:A small area of subacute infarction in the right occipital cortex.  Global hypoxic injury.  Few scattered chronic microhemorrhages likely secondary to longstanding hypertension.  Assessment: [age]ear old woman past history of ESRD, diabetes hypertension obesity and anemia presented status post cardiac arrest, with return of spontaneous circulation after 15 minutes. Also during initial work-up for admission, noted to be positive for COVID-19. Also noted to be hypokalemic and hypertensive as well as febrile at the time-started on broad-spectrum antibiotics-noted to have aspiration pneumonia. Neurology was consulted as MRI of the brain was done as she has not shown much improvement in  her mental exam, and the MRI revealed a small area of the stroke.  Upon further review of the MRI, it is consistent with a small right occipital stroke as well as evidence of global hypoxic/anoxic injury. Given her multiple comorbidities that include cardiomyopathy, ESRD and now aspiration pneumonia as well as plus/minus COVID-19 pneumonia, any current neurological recovery that might happen might be prolonged due to the multifactorial toxic metabolic encephalopathy. I spoke with Dr. Patsey Berthold, who has been in touch with the family and they want full scope of care for her at this time. I will encourage involving palliative medicine to the care of this patient so that family can be made aware of the realistic situation-in her case, at this time, it would appear based on the imaging as well as exam findings that more the number of days past without exam showing any evidence of cortical function, the last of the chances of neurologically meaningful recovery.  Impression: -Status post cardiac arrest-possible hypoxic/anoxic brain injury -Acute ischemic stroke-likely incidental in the setting of cardiomyopathy/low EF as well as CPR and  cardiac arrest. -ESRD, toxic metabolic encephalopathy  Recommendations:  Hypoxic/anoxic injury to the brain: -Involve palliative medicine for family discussions-given multiple current comorbidities, high risk for extremely poor recovery from a neurological standpoint. -Supportive care per ICU team as you are  Stroke: -Obtain carotid Dopplers -Hemoglobin A1c-7.4 in February 2021-no need to repeat.  -Lipid panel needs to be obtained -Given the appearance of stroke being embolic, a full embolic work-up should be done but in her case, this happening in the setting of a cardiac arrest as well as CPR and acute cardiomyopathy, the source is evidently cardiac at this time.  I do not recommend initiating anticoagulation as the echocardiogram was not suggestive of an LV thrombus and cardiac rhythm is not atrial fibrillation. -Aspirin as prophylactic for now would suffice.  Keep on telemetry.  If any evidence of atrial fibrillation, can initiate anticoagulation at that time.  Toxic metabolic encephalopathy: -Dialysis per primary team and nephrology.  Correction of toxic metabolic derangements per primary team as you are.  I have discussed my plan with Dr. Collier Bullock, of the critical care service.  -- Amie Portland, MD [facility] Pager: [phone] If 7pm to 7am, please call on call as listed on AMION.  CRITICAL CARE ATTESTATION Performed by: Amie Portland, MD Total critical care time: 40 minutes Critical care time was exclusive of separately billable procedures and treating other patients and/or supervising APPs/Residents/Students Critical care was necessary to treat or prevent imminent or life-threatening deterioration due to hypoxic/anoxic brain injury, stroke, toxic metabolic encephalopathy This patient is critically ill and at significant risk for neurological worsening and/or death and care requires constant monitoring. Critical care was time spent personally by me on the  following activities: development of treatment plan with patient and/or surrogate as well as nursing, discussions with consultants, evaluation of patient's response to treatment, examination of patient, obtaining history from patient or surrogate, ordering and performing treatments and interventions, ordering and review of laboratory studies, ordering and review of radiographic studies, pulse oximetry, re-evaluation of patient's condition, participation in multidisciplinary rounds and medical decision making of high complexity in the care of this patient.

## 2020-01-07 NOTE — Progress Notes (Signed)
RT note-called to room increased respirations and code blue. Patient was taken off wean and placed to full support at this time. BBS equal and ABG drawn. Compressions performed and patient was shocked times one. ROSC established. Respiratory rate increased and Fio2.

## 2020-01-08 ENCOUNTER — Inpatient Hospital Stay (HOSPITAL_COMMUNITY): Payer: Medicare HMO

## 2020-01-08 DIAGNOSIS — G931 Anoxic brain damage, not elsewhere classified: Secondary | ICD-10-CM | POA: Diagnosis not present

## 2020-01-08 DIAGNOSIS — I469 Cardiac arrest, cause unspecified: Secondary | ICD-10-CM

## 2020-01-08 LAB — CBC WITH DIFFERENTIAL/PLATELET
Abs Immature Granulocytes: 9.99 10*3/uL — ABNORMAL HIGH (ref 0.00–0.07)
Basophils Absolute: 0.1 10*3/uL (ref 0.0–0.1)
Basophils Relative: 0 %
Eosinophils Absolute: 0 10*3/uL (ref 0.0–0.5)
Eosinophils Relative: 0 %
HCT: 25.9 % — ABNORMAL LOW (ref 36.0–46.0)
Hemoglobin: 9 g/dL — ABNORMAL LOW (ref 12.0–15.0)
Immature Granulocytes: 21 %
Lymphocytes Relative: 8 %
Lymphs Abs: 3.7 10*3/uL (ref 0.7–4.0)
MCH: 30.9 pg (ref 26.0–34.0)
MCHC: 34.7 g/dL (ref 30.0–36.0)
MCV: 89 fL (ref 80.0–100.0)
Monocytes Absolute: 2.1 10*3/uL — ABNORMAL HIGH (ref 0.1–1.0)
Monocytes Relative: 4 %
Neutro Abs: 32 10*3/uL — ABNORMAL HIGH (ref 1.7–7.7)
Neutrophils Relative %: 67 %
Platelets: 192 10*3/uL (ref 150–400)
RBC: 2.91 MIL/uL — ABNORMAL LOW (ref 3.87–5.11)
RDW: 15.8 % — ABNORMAL HIGH (ref 11.5–15.5)
WBC: 47.8 10*3/uL — ABNORMAL HIGH (ref 4.0–10.5)
nRBC: 10.4 % — ABNORMAL HIGH (ref 0.0–0.2)

## 2020-01-08 LAB — COMPREHENSIVE METABOLIC PANEL
ALT: 1504 U/L — ABNORMAL HIGH (ref 0–44)
AST: 2683 U/L — ABNORMAL HIGH (ref 15–41)
Albumin: 1.1 g/dL — ABNORMAL LOW (ref 3.5–5.0)
Alkaline Phosphatase: 73 U/L (ref 38–126)
Anion gap: 16 — ABNORMAL HIGH (ref 5–15)
BUN: 63 mg/dL — ABNORMAL HIGH (ref 8–23)
CO2: 18 mmol/L — ABNORMAL LOW (ref 22–32)
Calcium: 7.5 mg/dL — ABNORMAL LOW (ref 8.9–10.3)
Chloride: 102 mmol/L (ref 98–111)
Creatinine, Ser: 3.44 mg/dL — ABNORMAL HIGH (ref 0.44–1.00)
GFR calc Af Amer: 16 mL/min — ABNORMAL LOW (ref >60)
GFR calc non Af Amer: 14 mL/min — ABNORMAL LOW (ref >60)
Glucose, Bld: 115 mg/dL — ABNORMAL HIGH (ref 70–99)
Potassium: 5.1 mmol/L (ref 3.5–5.1)
Sodium: 136 mmol/L (ref 135–145)
Total Bilirubin: 1.1 mg/dL (ref 0.3–1.2)
Total Protein: 3.9 g/dL — ABNORMAL LOW (ref 6.5–8.1)

## 2020-01-08 LAB — POCT I-STAT 7, (LYTES, BLD GAS, ICA,H+H)
Acid-base deficit: 5 mmol/L — ABNORMAL HIGH (ref 0.0–2.0)
Bicarbonate: 17.1 mmol/L — ABNORMAL LOW (ref 20.0–28.0)
Calcium, Ion: 1.07 mmol/L — ABNORMAL LOW (ref 1.15–1.40)
HCT: 24 % — ABNORMAL LOW (ref 36.0–46.0)
Hemoglobin: 8.2 g/dL — ABNORMAL LOW (ref 12.0–15.0)
O2 Saturation: 95 %
Patient temperature: 99.6
Potassium: 5.4 mmol/L — ABNORMAL HIGH (ref 3.5–5.1)
Sodium: 132 mmol/L — ABNORMAL LOW (ref 135–145)
TCO2: 18 mmol/L — ABNORMAL LOW (ref 22–32)
pCO2 arterial: 21.6 mmHg — ABNORMAL LOW (ref 32.0–48.0)
pH, Arterial: 7.509 — ABNORMAL HIGH (ref 7.350–7.450)
pO2, Arterial: 70 mmHg — ABNORMAL LOW (ref 83.0–108.0)

## 2020-01-08 LAB — GLUCOSE, CAPILLARY
Glucose-Capillary: 110 mg/dL — ABNORMAL HIGH (ref 70–99)
Glucose-Capillary: 120 mg/dL — ABNORMAL HIGH (ref 70–99)
Glucose-Capillary: 122 mg/dL — ABNORMAL HIGH (ref 70–99)
Glucose-Capillary: 124 mg/dL — ABNORMAL HIGH (ref 70–99)
Glucose-Capillary: 126 mg/dL — ABNORMAL HIGH (ref 70–99)
Glucose-Capillary: 137 mg/dL — ABNORMAL HIGH (ref 70–99)
Glucose-Capillary: 143 mg/dL — ABNORMAL HIGH (ref 70–99)
Glucose-Capillary: 151 mg/dL — ABNORMAL HIGH (ref 70–99)
Glucose-Capillary: 170 mg/dL — ABNORMAL HIGH (ref 70–99)
Glucose-Capillary: 183 mg/dL — ABNORMAL HIGH (ref 70–99)
Glucose-Capillary: 187 mg/dL — ABNORMAL HIGH (ref 70–99)
Glucose-Capillary: <10 mg/dL — CL (ref 70–99)

## 2020-01-08 LAB — LIPID PANEL
Cholesterol: 48 mg/dL (ref 0–200)
HDL: 15 mg/dL — ABNORMAL LOW (ref >40)
LDL Cholesterol: 25 mg/dL (ref 0–99)
Total CHOL/HDL Ratio: 3.2 RATIO
Triglycerides: 41 mg/dL (ref <150)
VLDL: 8 mg/dL (ref 0–40)

## 2020-01-08 MED ORDER — DEXTROSE 50 % IV SOLN
25.0000 g | INTRAVENOUS | Status: AC
  Administered 2020-01-08: 25 g via INTRAVENOUS

## 2020-01-08 MED ORDER — ASPIRIN 81 MG PO CHEW
81.0000 mg | CHEWABLE_TABLET | Freq: Every day | ORAL | Status: DC
  Administered 2020-01-09 – 2020-01-17 (×8): 81 mg via ORAL
  Filled 2020-01-08 (×9): qty 1

## 2020-01-08 MED ORDER — DEXTROSE 10 % IV SOLN: INTRAVENOUS | Status: DC

## 2020-01-08 MED ORDER — SODIUM ZIRCONIUM CYCLOSILICATE 10 G PO PACK
10.0000 g | PACK | Freq: Once | ORAL | Status: AC
  Administered 2020-01-08: 16:00:00 10 g via ORAL
  Filled 2020-01-08: qty 1

## 2020-01-08 MED ORDER — HEPARIN SODIUM (PORCINE) 5000 UNIT/ML IJ SOLN: 7500.0000 [IU] | Freq: Three times a day (TID) | INTRAMUSCULAR | Status: DC

## 2020-01-08 MED FILL — Medication: Qty: 1 | Status: AC

## 2020-01-08 NOTE — Progress Notes (Signed)
Bilateral carotid artery duplex exam completed.  Preliminary results can be found under CV proc under chart review.  01/08/2020 11:24 AM  Brailey Buescher, K., RDMS, RVT

## 2020-01-08 NOTE — Progress Notes (Addendum)
Subjective: Intubated. Will spontaneously cough per RN, but no cough elicitable with suctioning. Also with spontaneous blinking that appears more frequent than normal.   Objective: Current vital signs: BP 136/84 (BP Location: Left Arm)   Pulse (!) 114   Temp 99.6 F (37.6 C) (Axillary)   Resp (!) 22   Ht 5' 4"  (1.626 m)   Wt 73.4 kg   SpO2 99%   BMI 27.78 kg/m  Vital signs in last 24 hours: Temp:  [93.9 F (34.4 C)-100.8 F (38.2 C)] 99.6 F (37.6 C) (03/29 0745) Pulse Rate:  [51-120] 114 (03/29 0730) Resp:  [16-28] 22 (03/29 0745) BP: (70-169)/(47-91) 136/84 (03/29 0730) SpO2:  [95 %-100 %] 99 % (03/29 0812) Arterial Line BP: (79-187)/(39-71) 122/64 (03/29 0745) FiO2 (%):  [30 %-100 %] 30 % (03/29 0812) Weight:  [73.4 kg] 73.4 kg (03/29 0500)  Intake/Output from previous day: 03/28 0701 - 03/29 0700 In: 4379.5 [I.V.:2696.6; Blood:952.5; NG/GT:280; IV Piggyback:450.5] Out: 180 [Emesis/NG output:100; Stool:80] Intake/Output this shift: Total I/O In: 135.4 [I.V.:105.4; NG/GT:30] Out: -  Nutritional status:  Diet Order            Diet NPO time specified  Diet effective now             HEENT: Intubated Ext: No cyanosis.   Neurologic Exam: Ment: Intubated. Not on any sedation. Does not respond to voice or noxious stimuli. Spontaneous eye blinking and partial eye-opening is noted. No motor responses.  CN: Sluggishly reactive pupils 4 mm >> 3 mm. No blink to threat. Weak doll's eye reflex. Corneal reflexes intact. No grimace to noxious.  Motor/Sensory: Flaccid tone x 4. No movement to noxious stimuli x 4.  Reflexes: 0 bilateral brachioradialis. 2+ right patella, 3+ left patella. 0 achilles.  Cerebellar/Gait: Unable to assess  Lab Results: Results for orders placed or performed during the hospital encounter of 01/01/2020 (from the past 48 hour(s))  Glucose, capillary     Status: None   Collection Time: 01/06/20 11:26 AM  Result Value Ref Range   Glucose-Capillary 96  70 - 99 mg/dL    Comment: Glucose reference range applies only to samples taken after fasting for at least 8 hours.  Glucose, capillary     Status: Abnormal   Collection Time: 01/06/20 11:49 AM  Result Value Ref Range   Glucose-Capillary 104 (H) 70 - 99 mg/dL    Comment: Glucose reference range applies only to samples taken after fasting for at least 8 hours.  I-STAT 7, (LYTES, BLD GAS, ICA, H+H)     Status: Abnormal   Collection Time: 01/06/20 11:58 AM  Result Value Ref Range   pH, Arterial 7.492 (H) 7.350 - 7.450   pCO2 arterial 35.2 32.0 - 48.0 mmHg   pO2, Arterial 123.0 (H) 83.0 - 108.0 mmHg   Bicarbonate 26.9 20.0 - 28.0 mmol/L   TCO2 28 22 - 32 mmol/L   O2 Saturation 99.0 %   Acid-Base Excess 3.0 (H) 0.0 - 2.0 mmol/L   Sodium 138 135 - 145 mmol/L   Potassium 3.1 (L) 3.5 - 5.1 mmol/L   Calcium, Ion 1.17 1.15 - 1.40 mmol/L   HCT 24.0 (L) 36.0 - 46.0 %   Hemoglobin 8.2 (L) 12.0 - 15.0 g/dL   Patient temperature HIDE    Collection site ARTERIAL LINE    Sample type ARTERIAL   Glucose, capillary     Status: Abnormal   Collection Time: 01/06/20  3:25 PM  Result Value Ref Range   Glucose-Capillary  120 (H) 70 - 99 mg/dL    Comment: Glucose reference range applies only to samples taken after fasting for at least 8 hours.  Glucose, capillary     Status: None   Collection Time: 01/06/20  6:18 PM  Result Value Ref Range   Glucose-Capillary 87 70 - 99 mg/dL    Comment: Glucose reference range applies only to samples taken after fasting for at least 8 hours.  Glucose, capillary     Status: None   Collection Time: 01/06/20  7:56 PM  Result Value Ref Range   Glucose-Capillary 94 70 - 99 mg/dL    Comment: Glucose reference range applies only to samples taken after fasting for at least 8 hours.  Glucose, capillary     Status: Abnormal   Collection Time: 01/06/20 11:05 PM  Result Value Ref Range   Glucose-Capillary 124 (H) 70 - 99 mg/dL    Comment: Glucose reference range applies only  to samples taken after fasting for at least 8 hours.  CBC     Status: Abnormal   Collection Time: 01/07/20  2:55 AM  Result Value Ref Range   WBC 15.0 (H) 4.0 - 10.5 K/uL   RBC 2.55 (L) 3.87 - 5.11 MIL/uL   Hemoglobin 7.5 (L) 12.0 - 15.0 g/dL   HCT 22.6 (L) 36.0 - 46.0 %   MCV 88.6 80.0 - 100.0 fL   MCH 29.4 26.0 - 34.0 pg   MCHC 33.2 30.0 - 36.0 g/dL   RDW 18.4 (H) 11.5 - 15.5 %   Platelets 250 150 - 400 K/uL   nRBC 0.3 (H) 0.0 - 0.2 %    Comment: Performed at [facility] [address] [place] [address], [place], [place] [address]  Comprehensive metabolic panel     Status: Abnormal   Collection Time: 01/07/20  2:55 AM  Result Value Ref Range   Sodium 133 (L) 135 - 145 mmol/L   Potassium 5.0 3.5 - 5.1 mmol/L   Chloride 97 (L) 98 - 111 mmol/L   CO2 24 22 - 32 mmol/L   Glucose, Bld 223 (H) 70 - 99 mg/dL    Comment: Glucose reference range applies only to samples taken after fasting for at least 8 hours.   BUN 46 (H) 8 - 23 mg/dL   Creatinine, Ser 2.74 (H) 0.44 - 1.00 mg/dL    Comment: DELTA CHECK NOTED   Calcium 7.8 (L) 8.9 - 10.3 mg/dL   Total Protein 4.7 (L) 6.5 - 8.1 g/dL   Albumin 1.2 (L) 3.5 - 5.0 g/dL   AST 28 15 - 41 U/L   ALT 20 0 - 44 U/L   Alkaline Phosphatase 80 38 - 126 U/L   Total Bilirubin 0.6 0.3 - 1.2 mg/dL   GFR calc non Af Amer 18 (L) >60 mL/min   GFR calc Af Amer 21 (L) >60 mL/min   Anion gap 12 5 - 15    Comment: Performed at [facility] [address] [place], [place] [address]  Glucose, capillary     Status: Abnormal   Collection Time: 01/07/20  4:12 AM  Result Value Ref Range   Glucose-Capillary 181 (H) 70 - 99 mg/dL    Comment: Glucose reference range applies only to samples taken after fasting for at least 8 hours.  Glucose, capillary     Status: Abnormal   Collection Time: 01/07/20  8:05 AM  Result Value Ref Range   Glucose-Capillary 169 (H) 70 - 99 mg/dL  Comment: Glucose reference range applies only to samples taken after fasting for at least  8 hours.  Glucose, capillary     Status: Abnormal   Collection Time: 01/07/20 11:47 AM  Result Value Ref Range   Glucose-Capillary 300 (H) 70 - 99 mg/dL    Comment: Glucose reference range applies only to samples taken after fasting for at least 8 hours.  Glucose, capillary     Status: Abnormal   Collection Time: 01/07/20 12:19 PM  Result Value Ref Range   Glucose-Capillary 295 (H) 70 - 99 mg/dL    Comment: Glucose reference range applies only to samples taken after fasting for at least 8 hours.  Comprehensive metabolic panel     Status: Abnormal   Collection Time: 01/07/20  3:20 PM  Result Value Ref Range   Sodium 138 135 - 145 mmol/L   Potassium 5.3 (H) 3.5 - 5.1 mmol/L   Chloride 102 98 - 111 mmol/L   CO2 15 (L) 22 - 32 mmol/L   Glucose, Bld 222 (H) 70 - 99 mg/dL    Comment: Glucose reference range applies only to samples taken after fasting for at least 8 hours.   BUN 56 (H) 8 - 23 mg/dL   Creatinine, Ser 3.28 (H) 0.44 - 1.00 mg/dL   Calcium 7.3 (L) 8.9 - 10.3 mg/dL   Total Protein 3.6 (L) 6.5 - 8.1 g/dL   Albumin <1.0 (L) 3.5 - 5.0 g/dL   AST 1,652 (H) 15 - 41 U/L   ALT 1,089 (H) 0 - 44 U/L   Alkaline Phosphatase 71 38 - 126 U/L   Total Bilirubin 0.7 0.3 - 1.2 mg/dL   GFR calc non Af Amer 14 (L) >60 mL/min   GFR calc Af Amer 17 (L) >60 mL/min   Anion gap 21 (H) 5 - 15    Comment: Performed at [facility], [place] [address] [place], [place] [address]  Troponin I (High Sensitivity)     Status: Abnormal   Collection Time: 01/07/20  3:20 PM  Result Value Ref Range   Troponin I (High Sensitivity) 36 (H) <18 ng/L    Comment: (NOTE) Elevated high sensitivity troponin I (hsTnI) values and significant  changes across serial measurements may suggest ACS but many other  chronic and acute conditions are known to elevate hsTnI results.  Refer to the Links section for chest pain algorithms and additional  guidance. Performed at [facility], [place] [address],  [place], [place] [address]   CBC     Status: Abnormal   Collection Time: 01/07/20  3:20 PM  Result Value Ref Range   WBC 34.9 (H) 4.0 - 10.5 K/uL   RBC 1.56 (L) 3.87 - 5.11 MIL/uL   Hemoglobin 4.7 (LL) 12.0 - 15.0 g/dL    Comment: REPEATED TO VERIFY THIS CRITICAL RESULT HAS VERIFIED AND BEEN CALLED TO K.SPRANGERS,RN BY PAMELA HENDERSON ON 03 28 2021 AT 1538, AND HAS BEEN READ BACK.     HCT 14.8 (L) 36.0 - 46.0 %   MCV 94.9 80.0 - 100.0 fL    Comment: POST TRANSFUSION SPECIMEN REPEATED TO VERIFY    MCH 30.1 26.0 - 34.0 pg   MCHC 31.8 30.0 - 36.0 g/dL   RDW 19.6 (H) 11.5 - 15.5 %   Platelets 223 150 - 400 K/uL   nRBC 3.7 (H) 0.0 - 0.2 %    Comment: Performed at [facility] [address] [place], [place] [address]  Glucose,  capillary     Status: Abnormal   Collection Time: 01/07/20  3:38 PM  Result Value Ref Range   Glucose-Capillary 183 (H) 70 - 99 mg/dL    Comment: Glucose reference range applies only to samples taken after fasting for at least 8 hours.  Prepare RBC (crossmatch)     Status: None   Collection Time: 01/07/20  4:20 PM  Result Value Ref Range   Order Confirmation      ORDER PROCESSED BY BLOOD BANK Performed at [facility], [place] [address], [place], [place] [address]   Type and screen [place]     Status: None   Collection Time: 01/07/20  4:42 PM  Result Value Ref Range   ABO/RH(D) B POS    Antibody Screen NEG    Sample Expiration 01/10/2020,2359    Unit Number [id number]    Blood Component Type RED CELLS,LR    Unit division 00    Status of Unit ISSUED,FINAL    Transfusion Status OK TO TRANSFUSE    Crossmatch Result      Compatible Performed at [facility], [place] [address] [place], [place] [address]    Unit Number [id number]    Blood Component Type RED CELLS,LR    Unit division 00    Status of Unit ISSUED,FINAL    Transfusion Status OK TO TRANSFUSE    Crossmatch Result Compatible   Protime-INR     Status:  Abnormal   Collection Time: 01/07/20  5:07 PM  Result Value Ref Range   Prothrombin Time 22.3 (H) 11.4 - 15.2 seconds   INR 2.0 (H) 0.8 - 1.2    Comment: (NOTE) INR goal varies based on device and disease states. Performed at [facility], [place] [address], [place], [place] [address]   Glucose, capillary     Status: Abnormal   Collection Time: 01/07/20  8:32 PM  Result Value Ref Range   Glucose-Capillary 18 (LL) 70 - 99 mg/dL    Comment: Glucose reference range applies only to samples taken after fasting for at least 8 hours.   Comment 1 Repeat Test   Glucose, capillary     Status: Abnormal   Collection Time: 01/07/20  8:36 PM  Result Value Ref Range   Glucose-Capillary 24 (LL) 70 - 99 mg/dL    Comment: Glucose reference range applies only to samples taken after fasting for at least 8 hours.   Comment 1 Notify RN   Glucose, capillary     Status: Abnormal   Collection Time: 01/07/20  9:05 PM  Result Value Ref Range   Glucose-Capillary 126 (H) 70 - 99 mg/dL    Comment: Glucose reference range applies only to samples taken after fasting for at least 8 hours.  Hemoglobin and hematocrit, blood     Status: Abnormal   Collection Time: 01/07/20 10:33 PM  Result Value Ref Range   Hemoglobin 10.1 (L) 12.0 - 15.0 g/dL    Comment: REPEATED TO VERIFY POST TRANSFUSION SPECIMEN    HCT 29.2 (L) 36.0 - 46.0 %    Comment: Performed at [facility] [address] [place], [place] [address]  Glucose, capillary     Status: Abnormal   Collection Time: 01/08/20 12:10 AM  Result Value Ref Range   Glucose-Capillary <10 (LL) 70 - 99 mg/dL    Comment: Glucose reference range applies only to samples taken after fasting for at least 8 hours.  Glucose, capillary  Status: Abnormal   Collection Time: 01/08/20  1:06 AM  Result Value Ref Range   Glucose-Capillary 183 (H) 70 - 99 mg/dL    Comment: Glucose reference range applies only to samples taken after fasting for at least 8 hours.   Glucose, capillary     Status: Abnormal   Collection Time: 01/08/20  1:51 AM  Result Value Ref Range   Glucose-Capillary 143 (H) 70 - 99 mg/dL    Comment: Glucose reference range applies only to samples taken after fasting for at least 8 hours.  Glucose, capillary     Status: Abnormal   Collection Time: 01/08/20  3:16 AM  Result Value Ref Range   Glucose-Capillary 120 (H) 70 - 99 mg/dL    Comment: Glucose reference range applies only to samples taken after fasting for at least 8 hours.  Glucose, capillary     Status: Abnormal   Collection Time: 01/08/20  4:31 AM  Result Value Ref Range   Glucose-Capillary 110 (H) 70 - 99 mg/dL    Comment: Glucose reference range applies only to samples taken after fasting for at least 8 hours.  Comprehensive metabolic panel     Status: Abnormal   Collection Time: 01/08/20  4:34 AM  Result Value Ref Range   Sodium 136 135 - 145 mmol/L   Potassium 5.1 3.5 - 5.1 mmol/L   Chloride 102 98 - 111 mmol/L   CO2 18 (L) 22 - 32 mmol/L   Glucose, Bld 115 (H) 70 - 99 mg/dL    Comment: Glucose reference range applies only to samples taken after fasting for at least 8 hours.   BUN 63 (H) 8 - 23 mg/dL   Creatinine, Ser 3.44 (H) 0.44 - 1.00 mg/dL   Calcium 7.5 (L) 8.9 - 10.3 mg/dL   Total Protein 3.9 (L) 6.5 - 8.1 g/dL   Albumin 1.1 (L) 3.5 - 5.0 g/dL   AST 2,683 (H) 15 - 41 U/L    Comment: RESULTS CONFIRMED BY MANUAL DILUTION   ALT 1,504 (H) 0 - 44 U/L   Alkaline Phosphatase 73 38 - 126 U/L   Total Bilirubin 1.1 0.3 - 1.2 mg/dL   GFR calc non Af Amer 14 (L) >60 mL/min   GFR calc Af Amer 16 (L) >60 mL/min   Anion gap 16 (H) 5 - 15    Comment: Performed at [facility], [place] [address] [place], [place] [address]  Lipid panel     Status: Abnormal   Collection Time: 01/08/20  4:34 AM  Result Value Ref Range   Cholesterol 48 0 - 200 mg/dL   Triglycerides 41 <150 mg/dL   HDL 15 (L) >40 mg/dL   Total CHOL/HDL Ratio 3.2 RATIO   VLDL 8 0 - 40 mg/dL   LDL  Cholesterol 25 0 - 99 mg/dL    Comment:        Total Cholesterol/HDL:CHD Risk Coronary Heart Disease Risk Table                     Men   Women  1/2 Average Risk   3.4   3.3  Average Risk       5.0   4.4  2 X Average Risk   9.6   7.1  3 X Average Risk  23.4   11.0        Use the calculated Patient Ratio above and the CHD Risk Table to determine the patient's CHD Risk.  ATP III CLASSIFICATION (LDL):  <100     mg/dL   Optimal  100-129  mg/dL   Near or Above                    Optimal  130-159  mg/dL   Borderline  160-189  mg/dL   High  >190     mg/dL   Very High Performed at [facility] [address] [place], [place] [address]   CBC with Differential/Platelet     Status: Abnormal   Collection Time: 01/08/20  4:34 AM  Result Value Ref Range   WBC 47.8 (H) 4.0 - 10.5 K/uL    Comment: WHITE COUNT CONFIRMED ON SMEAR   RBC 2.91 (L) 3.87 - 5.11 MIL/uL   Hemoglobin 9.0 (L) 12.0 - 15.0 g/dL   HCT 25.9 (L) 36.0 - 46.0 %   MCV 89.0 80.0 - 100.0 fL   MCH 30.9 26.0 - 34.0 pg   MCHC 34.7 30.0 - 36.0 g/dL   RDW 15.8 (H) 11.5 - 15.5 %   Platelets 192 150 - 400 K/uL   nRBC 10.4 (H) 0.0 - 0.2 %   Neutrophils Relative % 67 %   Neutro Abs 32.0 (H) 1.7 - 7.7 K/uL   Lymphocytes Relative 8 %   Lymphs Abs 3.7 0.7 - 4.0 K/uL   Monocytes Relative 4 %   Monocytes Absolute 2.1 (H) 0.1 - 1.0 K/uL   Eosinophils Relative 0 %   Eosinophils Absolute 0.0 0.0 - 0.5 K/uL   Basophils Relative 0 %   Basophils Absolute 0.1 0.0 - 0.1 K/uL   WBC Morphology DOHLE BODIES     Comment: MODERATE LEFT SHIFT (>5% METAS AND MYELOS,OCC PRO NOTED) VACUOLATED NEUTROPHILS    Immature Granulocytes 21 %   Abs Immature Granulocytes 9.99 (H) 0.00 - 0.07 K/uL   Polychromasia PRESENT     Comment: Performed at [facility], [place] [address] [place],  [address]  Glucose, capillary     Status: Abnormal   Collection Time: 01/08/20  5:16 AM  Result Value Ref Range   Glucose-Capillary 122 (H) 70  - 99 mg/dL    Comment: Glucose reference range applies only to samples taken after fasting for at least 8 hours.  Glucose, capillary     Status: Abnormal   Collection Time: 01/08/20  6:53 AM  Result Value Ref Range   Glucose-Capillary 137 (H) 70 - 99 mg/dL    Comment: Glucose reference range applies only to samples taken after fasting for at least 8 hours.  Glucose, capillary     Status: Abnormal   Collection Time: 01/08/20  7:35 AM  Result Value Ref Range   Glucose-Capillary 124 (H) 70 - 99 mg/dL    Comment: Glucose reference range applies only to samples taken after fasting for at least 8 hours.    Recent Results (from the past 240 hour(s))  Respiratory Panel by RT PCR (Flu A&B, Covid) - Nasopharyngeal Swab     Status: Abnormal   Collection Time: 12/24/2019  8:34 PM   Specimen: Nasopharyngeal Swab  Result Value Ref Range Status   SARS Coronavirus 2 by RT PCR POSITIVE (A) NEGATIVE Final    Comment: RESULT CALLED TO, READ BACK BY AND VERIFIED WITH: RN OSORIO BAILEY AT [facility] ON 01/04/2020 (NOTE) SARS-CoV-2 target nucleic acids are DETECTED. SARS-CoV-2 RNA is generally detectable in upper respiratory specimens  during the acute phase of infection. Positive results are indicative  of the presence of the identified virus, but do not rule out bacterial infection or co-infection with other pathogens not detected by the test. Clinical correlation with patient history and other diagnostic information is necessary to determine patient infection status. The expected result is Negative. Fact Sheet for Patients:  PinkCheek.be Fact Sheet for Healthcare Providers: GravelBags.it This test is not yet approved or cleared by the [place] FDA and  has been authorized for detection and/or diagnosis of SARS-CoV-2 by FDA under an Emergency Use Authorization (EUA).  This EUA will remain in effect (meaning t his test can be  used) for the duration of  the COVID-19 declaration under Section 564(b)(1) of the Act, 21 U.S.C. section 360bbb-3(b)(1), unless the authorization is terminated or revoked sooner.    Influenza A by PCR NEGATIVE NEGATIVE Final   Influenza B by PCR NEGATIVE NEGATIVE Final    Comment: (NOTE) The Xpert Xpress SARS-CoV-2/FLU/RSV assay is intended as an aid in  the diagnosis of influenza from Nasopharyngeal swab specimens and  should not be used as a sole basis for treatment. Nasal washings and  aspirates are unacceptable for Xpert Xpress SARS-CoV-2/FLU/RSV  testing. Fact Sheet for Patients: PinkCheek.be Fact Sheet for Healthcare Providers: GravelBags.it This test is not yet approved or cleared by the [place] FDA and  has been authorized for detection and/or diagnosis of SARS-CoV-2 by  FDA under an Emergency Use Authorization (EUA). This EUA will remain  in effect (meaning this test can be used) for the duration of the  Covid-19 declaration under Section 564(b)(1) of the Act, 21  U.S.C. section 360bbb-3(b)(1), unless the authorization is  terminated or revoked. Performed at [facility], [place] [address], [place], [place] [address]   Culture, blood (routine x 2)     Status: None   Collection Time: 01/06/2020  8:55 PM   Specimen: BLOOD  Result Value Ref Range Status   Specimen Description BLOOD LEFT ANTECUBITAL  Final   Special Requests   Final    BOTTLES DRAWN AEROBIC AND ANAEROBIC Blood Culture results may not be optimal due to an inadequate volume of blood received in culture bottles   Culture   Final    NO GROWTH 5 DAYS Performed at [facility], [place] [address] [place], [place] [address]    Report Status 01/04/2020 FINAL  Final  Culture, blood (routine x 2)     Status: None   Collection Time: 12/31/2019  8:58 PM   Specimen: BLOOD LEFT HAND  Result Value Ref Range Status   Specimen Description BLOOD LEFT HAND   Final   Special Requests   Final    BOTTLES DRAWN AEROBIC AND ANAEROBIC Blood Culture results may not be optimal due to an inadequate volume of blood received in culture bottles   Culture   Final    NO GROWTH 5 DAYS Performed at [facility], [place] [address] [place], [place] [address]    Report Status 01/04/2020 FINAL  Final  MRSA PCR Screening     Status: None   Collection Time: 12/31/19  1:48 AM   Specimen: Nasopharyngeal  Result Value Ref Range Status   MRSA by PCR NEGATIVE NEGATIVE Final    Comment:        The GeneXpert MRSA Assay (FDA approved for NASAL specimens only), is one component of a comprehensive MRSA colonization surveillance program. It is not intended to diagnose MRSA infection nor to guide or monitor treatment for MRSA infections. Performed at  [facility], [place] [address], [place], [place] [address]   Culture, respiratory (non-expectorated)     Status: None   Collection Time: 01/02/20 11:11 AM   Specimen: Tracheal Aspirate; Respiratory  Result Value Ref Range Status   Specimen Description TRACHEAL ASPIRATE  Final   Special Requests NONE  Final   Gram Stain   Final    MODERATE WBC PRESENT,BOTH PMN AND MONONUCLEAR RARE GRAM POSITIVE COCCI IN PAIRS Performed at [facility], [place] [address] [place], [place] [address]    Culture FEW ENTEROBACTER CLOACAE  Final   Report Status 01/04/2020 FINAL  Final   Organism ID, Bacteria ENTEROBACTER CLOACAE  Final      Susceptibility   Enterobacter cloacae - MIC*    CEFAZOLIN RESISTANT Resistant     CEFEPIME <=0.12 SENSITIVE Sensitive     CEFTAZIDIME <=1 SENSITIVE Sensitive     CIPROFLOXACIN <=0.25 SENSITIVE Sensitive     GENTAMICIN <=1 SENSITIVE Sensitive     IMIPENEM <=0.25 SENSITIVE Sensitive     TRIMETH/SULFA <=20 SENSITIVE Sensitive     PIP/TAZO 8 SENSITIVE Sensitive     * FEW ENTEROBACTER CLOACAE    Lipid Panel Recent Labs    01/08/20 0434  CHOL 48  TRIG 41  HDL 15*  CHOLHDL 3.2   VLDL 8  LDLCALC 25    Studies/Results: DG CHEST PORT 1 VIEW  Result Date: 01/07/2020 CLINICAL DATA:  Status post central line placement EXAM: PORTABLE CHEST 1 VIEW COMPARISON:  January 07, 2020 FINDINGS: The tunneled dialysis catheter is stable in positioning. There is a new right-sided central venous catheter with tip terminating near the cavoatrial junction. There is no evidence for right-sided pneumothorax. The endotracheal tube terminates approximately 4.1 cm above the carina. The enteric tube extends below the left hemidiaphragm. There is some atelectasis at the lung bases without evidence for a focal infiltrate or large pleural effusion. IMPRESSION: 1. Lines and tubes as above.  No evidence for pneumothorax. 2. Otherwise, stable appearance of the chest. Electronically Signed   By: Constance Holster M.D.   On: 01/07/2020 21:40   DG CHEST PORT 1 VIEW  Result Date: 01/07/2020 CLINICAL DATA:  Pneumonia EXAM: PORTABLE CHEST 1 VIEW COMPARISON:  01/02/2020 FINDINGS: Endotracheal tube with the tip 19 mm above the carina. Nasogastric tube coursing below the diaphragm. Dual lumen right-sided central venous catheter with the tip projecting over the SVC. No focal consolidation, pleural effusion or pneumothorax. Stable cardiomediastinal silhouette. No aggressive osseous lesion. IMPRESSION: Support lines tubing in satisfactory position. No acute cardiopulmonary disease. Electronically Signed   By: Kathreen Devoid   On: 01/07/2020 08:01    Medications:  Scheduled: . atorvastatin  40 mg Oral q1800  . chlorhexidine gluconate (MEDLINE KIT)  15 mL Mouth Rinse BID  . Chlorhexidine Gluconate Cloth  6 each Topical Daily  . Chlorhexidine Gluconate Cloth  6 each Topical Q0600  . [START ON 01/13/2020] darbepoetin (ARANESP) injection - DIALYSIS  200 mcg Intravenous Q Sat-HD  . feeding supplement (PRO-STAT SUGAR FREE 64)  30 mL Per Tube BID  . hydrocortisone sod succinate (SOLU-CORTEF) inj  50 mg Intravenous Q6H  .  insulin aspart  0-24 Units Subcutaneous Q4H  . insulin aspart  8 Units Subcutaneous Q4H  . insulin glargine  20 Units Subcutaneous BID  . mouth rinse  15 mL Mouth Rinse 10 times per day  . multivitamin  1 tablet Per Tube QHS  . nitroGLYCERIN  1 inch Topical Q6H  . pantoprazole (  PROTONIX) IV  40 mg Intravenous QHS   Continuous: . dextrose 75 mL/hr at 01/08/20 0800  . epinephrine Stopped (01/08/20 0053)  . feeding supplement (VITAL 1.5 CAL) Stopped (01/07/20 1900)  . labetalol (NORMODYNE) infusion Stopped (01/06/20 2300)  . meropenem (MERREM) IV Stopped (01/07/20 1717)  . norepinephrine (LEVOPHED) Adult infusion 19 mcg/min (01/08/20 0800)  . phenylephrine (NEO-SYNEPHRINE) Adult infusion Stopped (01/07/20 2106)  . [START ON 01/09/2020] vancomycin    . vasopressin (PITRESSIN) infusion - *FOR SHOCK* 0.03 Units/min (01/08/20 0800)      Assessment: 64 year old woman past history of ESRD, DM, HTN and anemia who presented status post cardiac arrest, with return of spontaneous circulation after 15 minutes. During initial work-up for admission, was noted to be positive for COVID-19. Also noted to be hypokalemic and hypertensive as well as febrile at the time; she was started on broad-spectrum antibiotics and was noted to have aspiration pneumonia. Neurology was consulted due to lack of significant improvement on her neurological exams.  1. 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  Also noted were findings on DWI diffusely in the cerebellar hemispheres and bilateral hippocampi that appear most consistent with hypoxic/anoxic injury. 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  2. Given her multiple comorbidities that include cardiomyopathy, ESRD and now aspiration pneumonia as well as plus/minus COVID-19  pneumonia, any current neurological recovery that might happen might be prolonged due to the multifactorial toxic metabolic encephalopathy. 3. Most recent EEG on 3/23 revealed findingssuggestive of profound diffuse encephalopathy, nonspecific to etiology.No seizures or epileptiform discharges were seen throughout the recording. 4. Family has stated that they want full scope of care for her at this time.  5. Palliative medicine consult is recommended. Based on the imaging as well as the number of days since cardiac arrest without exam showing any evidence of cortical function, the likelihood of a neurologically meaningful recovery is felt to be low. 6. The small subacute ischemic stroke is felt most likely to be incidental in the setting of cardiomyopathy/low EF as well as CPR and cardiac arrest. 7. 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 -Supportive care per ICU team as you are  Stroke: -Carotid Dopplers may be obtained, but unlikely to change management -Given the appearance of stroke on MRI as well as this happening in the setting of a cardiac arrest as well as CPR and acute cardiomyopathy, the source is evidently cardiac at this time.  We do not recommend initiating anticoagulation as the echocardiogram was not suggestive of an LV thrombus and cardiac rhythm is not atrial fibrillation. -Aspirin as prophylactic for now would suffice.  Keep on telemetry.  If any evidence of atrial fibrillation, can initiate anticoagulation at that time.  Toxic metabolic encephalopathy: -Dialysis per primary team and nephrology.  Correction of toxic metabolic derangements per primary team as you are.  Neurology will sign off at this time. Please call if there are additional questions.   35 minutes  spent in the neurological evaluation and management of this critically ill patient.    LOS: 9 days   @Electronically  signed: Dr. Kerney Elbe 01/08/2020  9:07 AM

## 2020-01-08 NOTE — Progress Notes (Signed)
Spoke with patient's daughter and given full update/answered all questions.

## 2020-01-08 NOTE — Progress Notes (Signed)
[facility] Progress Note Patient Name: Patricia [name] DOB: Mar 03, 1956 MRN: [id number]   Date of Service  01/08/2020  HPI/Events of Note  AM CBC  eICU Interventions  ordered     Intervention Category Minor Interventions: Other:;Routine modifications to care plan (e.g. PRN medications for pain, fever)  Elmer Sow 01/08/2020, 1:51 AM

## 2020-01-08 NOTE — Progress Notes (Signed)
Patient ID: Patricia [name], female   DOB: 1956/05/09, 64 y.o.   MRN: [id number]  [facility] Progress Note   Assessment/ Plan:   1.  Cardiac arrest/HIE: Neuro status not improving; per neuro and CCM; grim prognosis  2. Acute hypoxic respiratory failure on vent per critical care  3. Shock - septic vs. Cardiogenic?  On levo and vaso   4. ESRD: Has been on HD per TTS schedule: 3.5h, TDC, 400/800, 2K, Tight Heparin, 2L UF.  See that critical care has recommended comfort care and has had goals of care discussions with family yesterday.  Will discuss with team.   5. Anemia Ckd: Ferritin elevated from #5.  ESA qSat and recently increase dose  6. CKD-MBD: Enteral nutrition.  calcium and phosphorus levels at goal.  7.  COVID-19 infection: Incidental finding and s/p remdesivir. On hydrocortisone. Per CCM  Subjective:   note sudden hypotension and ?PEA arrest on 3/28.  Per critical care note, they feel comfort care would be most appropriate and family has come to see patient per nursing.  They did not make the decision to transition her to comfort measures yet per nursing.  Note Hb of 4.7 on 3/28  Review of systems: unable to obtain secondary to intubated     Objective:   BP (!) 145/91   Pulse (!) 119   Temp 99.3 F (37.4 C) (Axillary)   Resp (!) 24   Ht _0  (1.626 m)   Wt 73.4 kg   SpO2 100%   BMI 27.78 kg/m   Physical Exam: General adult female examined through window given covid positive status to conserve PPE in setting of pandemic; spoke in detail with RN. A chart review of other providers notes and the patient's lab work as well as review of other pertinent studies was performed.  Exam details from prior documentation were reviewed specifically and confirmed with the bedside nurse.  [facility] of [facility]   General adult female examined through window critically ill; spoke in detail with RN HEENT normocephalic atraumatic  Lungs intubated; per  nursing coarse breath sounds; low PEEP -5 and 30 FIO2  Heart tachycardic; on levo at 25 and vaso at 0.04 Extremities no overtly visible edema and nursing concurs Neuro - per nursing not following commands Access RIJ tunneled catheter per nursing   Labs: BMET Recent Labs  Lab 01/01/20 1132 01/01/20 1342 01/01/20 1626 01/01/20 1810 01/02/20 0355 01/02/20 0355 01/03/20 0350 01/03/20 0350 01/04/20 0702 01/05/20 0500 01/06/20 0533 01/06/20 1158 01/07/20 0255 01/07/20 1520 01/08/20 0434  NA   < >  --  136   < > 136   < > 138   < > 137 136 132* 138 133* 138 136  K   < >  --  5.1   < > 5.4*   < > 3.8   < > 4.1 4.6 5.3* 3.1* 5.0 5.3* 5.1  CL   < >  --  105   < > 106   < > 102  --  100 97* 93*  --  97* 102 102  CO2   < >  --  22   < > 20*   < > 24  --  _1 --  24 15* 18*  GLUCOSE   < >  --  210*   < > 313*   < > 215*  --  81 122* 287*  --  223* 222* 115*  BUN   < >  --  32*   < > 45*   < > 41*  --  68* 50* 78*  --  46* 56* 63*  CREATININE   < >  --  3.42*   < > 4.25*   < > 3.33*  --  4.38* 3.47* 4.14*  --  2.74* 3.28* 3.44*  CALCIUM   < >  --  7.4*   < > 7.4*   < > 7.2*  --  7.7* 7.9* 8.1*  --  7.8* 7.3* 7.5*  PHOS  --  6.9* 7.2*  --  7.3*  --  3.1  --  2.7  --   --   --   --   --   --    < > = values in this interval not displayed.   CBC Recent Labs  Lab 01/03/20 0350 01/04/20 0729 01/06/20 0533 01/06/20 1158 01/07/20 0255 01/07/20 1520 01/07/20 2233 01/08/20 0434  WBC 15.9*   < > 9.8  --  15.0* 34.9*  --  47.8*  NEUTROABS 14.1*  --   --   --   --   --   --  32.0*  HGB 9.2*   < > 7.7*   < > 7.5* 4.7* 10.1* 9.0*  HCT 28.2*   < > 23.3*   < > 22.6* 14.8* 29.2* 25.9*  MCV 91.9   < > 89.6  --  88.6 94.9  --  89.0  PLT 240   < > 242  --  250 223  --  192   < > = values in this interval not displayed.      Medications:    . atorvastatin  40 mg Oral q1800  . chlorhexidine gluconate (MEDLINE KIT)  15 mL Mouth Rinse BID  . Chlorhexidine Gluconate Cloth  6 each Topical  Daily  . Chlorhexidine Gluconate Cloth  6 each Topical Q0600  . [START ON 01/13/2020] darbepoetin (ARANESP) injection - DIALYSIS  200 mcg Intravenous Q Sat-HD  . feeding supplement (PRO-STAT SUGAR FREE 64)  30 mL Per Tube BID  . hydrocortisone sod succinate (SOLU-CORTEF) inj  50 mg Intravenous Q6H  . insulin aspart  0-24 Units Subcutaneous Q4H  . insulin aspart  8 Units Subcutaneous Q4H  . insulin glargine  20 Units Subcutaneous BID  . mouth rinse  15 mL Mouth Rinse 10 times per day  . multivitamin  1 tablet Per Tube QHS  . nitroGLYCERIN  1 inch Topical Q6H  . pantoprazole (PROTONIX) IV  40 mg Intravenous QHS   Claudia Desanctis , MD 01/08/2020, 6:55 AM

## 2020-01-08 NOTE — Progress Notes (Signed)
eLink Physician-Brief Progress Note Patient Name: DEMYA SCRUGGS DOB: 08-Sep-1956 MRN: [id number]   Date of Service  01/08/2020  HPI/Events of Note  Severe hypoglycemia   eICU Interventions  Hypoglycemia protocol Add D10 Check glucose every hour for now     Intervention Category Intermediate Interventions: Hyperglycemia - evaluation and treatment  Margaretmary Lombard 01/08/2020, 12:40 AM

## 2020-01-08 NOTE — Progress Notes (Signed)
Nutrition Follow-up  DOCUMENTATION CODES:   Not applicable  INTERVENTION:   If algins with goals of care, recommend resuming TF  Tube Feeding:  Vital 1.5 at 40 ml/hr Pro-Stat 30 mL BID Provides 1640 kcals, 95 g of protein and 730 mL of free water Meets 100% estimated calorie and protein needs  NUTRITION DIAGNOSIS:   Inadequate oral intake related to acute illness as evidenced by NPO status.  GOAL:   Patient will meet greater than or equal to 90% of their needs  Not Met  MONITOR:   Vent status, Labs, Weight trends, TF tolerance, Skin  REASON FOR ASSESSMENT:   Consult, Ventilator Enteral/tube feeding initiation and management  ASSESSMENT:   64 yo female admitted post cardiac arrest, COVID19. PMH includes ESRD on HD, CHF, DM, HTN, STEMI  3/20 Admitted, TTM 3/28 ?PEA arrest, large emesis at time of code   Palliative care consulted   Pt with anoxic brain injury; poor recovery from neurological standpoint per neuro MD. Poor prognosis per CCM Patient is currently intubated on ventilator support, requiring vasopressin and levophed. Off paralytic MV: 11.1 L/min Temp (24hrs), Avg:97.7 F (36.5 C), Min:93.9 F (34.4 C), Max:100.8 F (38.2 C)  Propofol: NONE  TF off due to large emesis during code yesterday. Pt previously tolerating Vital 1.5 at 40 ml/hr, Pro-Stat 30 mL BID.  Current wt 73.4 kg; admit weight 62.6, current wt 67.6 kg.  Outpatient EDW 62 kg  Labs: sodium 132 (L), potassium 5.4 (H) Meds: D10 at 75 ml/hr, ss novolog, solucortef, Rena-Vit  Diet Order:   Diet Order            Diet NPO time specified  Diet effective now              EDUCATION NEEDS:   Not appropriate for education at this time  Skin:  Skin Assessment: Reviewed RN Assessment  Last BM:  3/29 rectal tube  Height:   Ht Readings from Last 1 Encounters:  01/08/20 _0  (1.626 m)    Weight:   Wt Readings from Last 1 Encounters:  01/08/20 73.4 kg    BMI:  Body mass index  is 27.78 kg/m.  Estimated Nutritional Needs:   Kcal:  1600-1900 kcals  Protein:  93-111 g  Fluid:  1000 mL plus UOP   Kerman Passey MS, RDN, LDN, CNSC RD Pager Number and Weekend/On-Call After Hours Pager Located in [place]

## 2020-01-08 NOTE — Progress Notes (Signed)
NAME:  Patricia [name], MRN:  [id number], DOB:  17-Oct-1955, LOS: 9 ADMISSION DATE:  12/20/2019, CONSULTATION DATE:  01/08/20 REFERRING MD:  Regenia Skeeter CHIEF COMPLAINT:  Cardiac Arrest   Brief History   64 y.o. F with PMH ESRD, anemia, HFrEF, Depression, Type 2 DM, HTN and obesity who presented in cardiac arrest.  Pt was in her usual state of health and went to dialysis 3/20 where she became nauseated and vomited, at home she had a syncopal episode and collapse. CPR initiated and ROSC after 15 mins.    History of present illness   Brekyn [name] is a 64 y.o. F with PMH of  ESRD, anemia, HFrEF, Depression, Type 2 DM, HTN and obesity who presented in cardiac arrest. She lives with family and they state she has been in her usual state of health and went to dialysis today where she had an episode of nausea and vomiting and returned home.  There she became sweaty and collapsed, family started CPR immediately and called EMS who performed 15 minutes of CPR with ROSC, no mention of shockable rhythm.    In the ED, pt was not initially responsive and was intubated.  CT head was without acute findings, patient was hypertensive and febrile with a potassium of 2.8 and prolonged QTC.  No sign of STEMI on EKG.  Lactic acid was elevated and chest x-ray showed vascular congestion.  She was given 2 g of magnesium, broad-spectrum antibiotics and IV fluids.  Covid-19 resulted positive.  PCCM consulted for admission.  Family states that she has showed no symptoms of Covid, and they are unsure how she may have contracted this.  Past Medical History   has a past medical history of Allergy, Anemia, Anxiety, Blood transfusion without reported diagnosis, Cardiomyopathy ([place]) (11/2016), CHF (congestive heart failure) ([place]), CKD (chronic kidney disease), stage IV ([place]), Depression, Diabetes mellitus without complication ([place]), Diverticulitis, Dyspnea, Hypertension, and Obesity.   Significant Hospital Events   3/20 admit to  [facility] 3/21 TTM initiated to 33 3/22 completed TTM, rewarmed 3/27: hypertensive prior to HD.  Started hydral, had been on labetelol gtt.  3/28 AM briefly on levophed for hypotension. Became acutely hypotensive in early afternoon, followed by refractory hypotension then cardiac arrest (PEA per nurse).   PEA --> vfib. Defibrillated back to sinus.   Consults:  Nephrology  Procedures:  3/20 ET tube 3/20 CVC > 3/24  Significant Diagnostic Tests:  3/20 CT head>> no acute findings 3/21 EEG>> generalized background slowing, no epileptiform activity 3/21 echocardiogram-LVEF 30 to 35%, no regional wall motion abnormalities, moderate LVH.  Grade 1 diastolic dysfunction.  Mildly dilated LA, normal RV. 3/22 EEG-generalized background attenuation with generalized slowing c/w profound diffuse encephalopathy  3/24: MRI IMPRESSION: Small area of acute infarct in the right occipital cortex. This is more likely to be an embolus than anoxic brain injury.  Mild chronic microvascular ischemic changes in the white matter. Scattered areas of microhemorrhage in the brain correlate with hypertension history.   Micro Data:  3/20 SARS-Cov-2>> positive 3/22 sputum- GNRs> enterobacter cloacae (R cefazolin, otherwise sensitive)  Antimicrobials:  Cefepime 3/20- Flagyl 3/20 only Vancomycin 3/20-3/22 Zosyn 3/24>>  Interim history/subjective:  Cardiac arrest following hypotension 3/28  Objective   Blood pressure 136/84, pulse (!) 114, temperature 99.6 F (37.6 C), temperature source Axillary, resp. rate (!) 22, height 5\' 4"  (1.626 m), weight 73.4 kg, SpO2 99 %.    Vent Mode: PRVC FiO2 (%):  [30 %-100 %] 30 % Set Rate:  [94  bmp-24 bmp] 24 bmp Vt Set:  [430 mL] 430 mL PEEP:  [5 cmH20] 5 cmH20 Pressure Support:  [5 cmH20] 5 cmH20 Plateau Pressure:  [20 cmH20-21 cmH20] 20 cmH20   Intake/Output Summary (Last 24 hours) at 01/08/2020 0926 Last data filed at 01/08/2020 0800 Gross per 24 hour  Intake  4421.82 ml  Output 180 ml  Net 4241.82 ml   Filed Weights   01/06/20 1300 01/07/20 0500 01/08/20 0500  Weight: 67.8 kg 67.6 kg 73.4 kg    General: critically ill appearing woman laying in bed in NAD, eyes open intermittently HEENT: [place]/AT, eyes anicteric, oral mucosa moist. OGT & ETT. Cardio: Regular rate and rhythm, no murmurs Respiratory: CTAB.  Abdomen: Soft, nontender, nondistended Extremities: No clubbing, cyanosis, or edema. Derm: No rashes or wounds. Neuro: eyes open, not purposeful eye opening, not tracking, Pupils reactive to light  Not withdrawing to pain in any extremity or trapezius squeeze bilaterally.  Not following commands. +blink to threat   Resolved Hospital Problem list     Assessment & Plan:  Cardiac arrest, PEA/vfib:  Episode of hypotension yesterday, followed by cardiac arrest.  Unclear cause.  Mostly likely sepsis, given elevated WBC, minimal fever. Possibly centrally mediated?   Doubt hemorrhage, initially low Hb corrected disproportionately to blood transfusion, wonder if hb of 4.7 was error.  There was also no evidence of bleeding on exam.  Initial cardiac arrest prior to admission, possibly due to electrolyte disturbance, prolonged QTC.  Given event episode, I wonder about labile blood pressure and or hypotension after HD as outpatient could have caused her out of hospital arrest.    Currently remains on levophed 25mcg per min and vasopressin.  Stress dose steroids.  Cont broad spectrum antibiotics.   Transaminitis: Shock liver.  Cont to monitor. INR elevated.    Hypoglyemia:  Holding lantus and short acting insulin.  On D10  Acute cardiopulmonary arrest prior to admission -Possibly related to electrolyte disturbance prolonged QTC worsening, heart failure.  no signs of acute ischemia on EKG and troponin mildly elevated, echocardiogram suggests against acute MI. -ROSC obtained after 15 minutes of CPR -CT head without acute  findings P: -Continue to monitor electrolytes.  QTC improved previously. -Continue to maintain normothermia, euglycemia, head of bed greater than 30 degrees -Continue vent support -Avoid hypoxia and hypercapnia  QTC prolongation  PTA on phenergan. -Qtc 651; 45ms on 3/24 -Per cardiology note she has a history of prolonged QT interval, usually 480-510 ms -Last stress test in 2018 did not show evidence of ischemia she does not have a history of ventricular arrhythmia P: -Continue avoiding all QTC prolonging medications  Anoxic encephalopathy -Completed TTM to 33 degrees after initial out of hospital arrest.  -Continue to avoid all sedation.  Fentanyl as needed ordered if evidence of pain -on review by neurology and radiology: anoxic injury seen on MRI Prognosis poor and do not expect significant return of function.  Cont discussions with family.  Palliative care consulted.    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  Embolic?  No evidence of clot on Echo.  Consider TEE when more stable.   Neuro consult appreciated.  Microhemorrhages Carotid dopplers, lipid panel.  Holding asa today given the remote possibility of hemorrhage yesterday.  Will restart tomorrow if remains stable.   Acute respiratory failure requiring mechanical ventilation post cardiac arrest. LLL pneumonia due to Enterobacter likely from aspiration during cardiac arrest. Tolerating minimal vent support.  -Continue daily SBT.  Continue to hold sedation. -VAP prevention protocol -on BS antibiotics now for possible sepsis.   Covid-19 infection -Patient with low-grade fever on arrival, no URI symptoms per family  P: -today is day 9 of steroids.  Broadened to stress dose steroids for shock.  Previously completed remdesivir. On heparin 7500 TID for ppx - cont to hold today given elevated INR.   HFrEF  Acutely worsened systolic function post-arrest. Bradycardia resolved post-  rewarming. -Echocardiogram 1 year ago with EF of 40 to 45% -She has recently been taken off all home antihypertensive medication except hydralazine due to borderline low blood pressure -Follows with Dr. Sallyanne Kuster  HTN:  BP very labile.   Currently hypotensive and all anti HTN meds held.   HLD Lipitor  Hyperglycemia, now controlled. DM2 with a1c 7.4 -Continue Lantus 20 units twice daily -Continue tube feeding coverage-NovoLog 8 units every 4 hours -Accu-Cheks every 4 hours with sliding scale insulin as needed -Goal BG 140-180 while admitted to the ICU  Anemia:  Anemia yesterday may have been error?  Hb 4.7 corrected to >10 with 2 uprbc.  Stable today.  No clinical evidence of bleeding.   Montior Transfuse for Hb <7  F/E/N: P: -Continue tube feeds -Continue to monitor electrolytes  Hypokalemia- resolved Unlikely the sole cause for prolonged QTC given it's persistence after correction of electrolytes.  ESRD -T/TH/sat schedule -Per family it sounds like she completed most of her dialysis 3/20 P: -Appreciate nephrology's assistance. Last HD 3/28 Plan for CRRT on Tuesday will depend on overall plan of care.  Encouraging family to consider comfort at this point.      Best practice:  Diet: TF Pain/Anxiety/Delirium protocol (if indicated):  VAP protocol (if indicated): HOB 30 degrees, daily SBTDVT prophylaxis: Heparin GI prophylaxis: Protonix Glucose control: SSI Mobility: Bedrest Code Status: Full code   Family Communication: daughter Almon Register - still hoping for a miracle. Believes her mom will improve.  Disposition: ICU  Labs   CBC: Recent Labs  Lab 01/03/20 0350 01/03/20 0350 01/04/20 0729 01/04/20 0729 01/06/20 0533 01/06/20 0533 01/06/20 1158 01/07/20 0255 01/07/20 1520 01/07/20 2233 01/08/20 0434  WBC 15.9*   < > 17.0*  --  9.8  --   --  15.0* 34.9*  --  47.8*  NEUTROABS 14.1*  --   --   --   --   --   --   --   --   --  32.0*  HGB 9.2*   < > 8.8*   < >  7.7*   < > 8.2* 7.5* 4.7* 10.1* 9.0*  HCT 28.2*   < > 27.1*   < > 23.3*   < > 24.0* 22.6* 14.8* 29.2* 25.9*  MCV 91.9   < > 89.4  --  89.6  --   --  88.6 94.9  --  89.0  PLT 240   < > 257  --  242  --   --  250 223  --  192   < > = values in this interval not displayed.    Basic Metabolic Panel: Recent Labs  Lab 01/01/20 1132 01/01/20 1342 01/01/20 1626 01/01/20 1810 01/02/20 0355 01/02/20 0355 01/03/20 0350 01/03/20 0350 01/04/20 0702 01/04/20 0702 01/05/20 0500 01/05/20 0500 01/06/20 0533 01/06/20 1158 01/07/20 0255 01/07/20 1520 01/08/20 0434  NA   < >  --  136   < > 136   < > 138   < > 137   < > 136   < >  132* 138 133* 138 136  K   < >  --  5.1   < > 5.4*   < > 3.8   < > 4.1   < > 4.6   < > 5.3* 3.1* 5.0 5.3* 5.1  CL   < >  --  105   < > 106   < > 102   < > 100   < > 97*  --  93*  --  97* 102 102  CO2   < >  --  22   < > 20*   < > 24   < > 25   < > 25  --  23  --  24 15* 18*  GLUCOSE   < >  --  210*   < > 313*   < > 215*   < > 81   < > 122*  --  287*  --  223* 222* 115*  BUN   < >  --  32*   < > 45*   < > 41*   < > 68*   < > 50*  --  78*  --  46* 56* 63*  CREATININE   < >  --  3.42*   < > 4.25*   < > 3.33*   < > 4.38*   < > 3.47*  --  4.14*  --  2.74* 3.28* 3.44*  CALCIUM   < >  --  7.4*   < > 7.4*   < > 7.2*   < > 7.7*   < > 7.9*  --  8.1*  --  7.8* 7.3* 7.5*  MG  --  2.0 2.0  --  2.1  --  1.9  --   --   --   --   --   --   --   --   --   --   PHOS  --  6.9* 7.2*  --  7.3*  --  3.1  --  2.7  --   --   --   --   --   --   --   --    < > = values in this interval not displayed.   GFR: Estimated Creatinine Clearance: 16.9 mL/min (A) (by C-G formula based on SCr of 3.44 mg/dL (H)). Recent Labs  Lab 01/02/20 0355 01/03/20 0350 01/06/20 0533 01/07/20 0255 01/07/20 1520 01/08/20 0434  PROCALCITON 21.64  --   --   --   --   --   WBC 16.6*   < > 9.8 15.0* 34.9* 47.8*   < > = values in this interval not displayed.    Liver Function Tests: Recent Labs  Lab  01/05/20 0500 01/06/20 0533 01/07/20 0255 01/07/20 1520 01/08/20 0434  AST 29 30 28  1,652* 2,683*  ALT 21 21 20  1,089* 1,504*  ALKPHOS 82 81 80 71 73  BILITOT 0.7 0.6 0.6 0.7 1.1  PROT 5.1* 4.7* 4.7* 3.6* 3.9*  ALBUMIN 1.3* 1.2* 1.2* <1.0* 1.1*   No results for input(s): LIPASE, AMYLASE in the last 168 hours. No results for input(s): AMMONIA in the last 168 hours.  ABG    Component Value Date/Time   PHART 7.492 (H) 01/06/2020 1158   PCO2ART 35.2 01/06/2020 1158   PO2ART 123.0 (H) 01/06/2020 1158   HCO3 26.9 01/06/2020 1158   TCO2 28 01/06/2020 1158   ACIDBASEDEF 4.0 (H) 01/01/2020 1029   O2SAT 99.0 01/06/2020 1158  Coagulation Profile: Recent Labs  Lab 01/07/20 1707  INR 2.0*    Cardiac Enzymes: No results for input(s): CKTOTAL, CKMB, CKMBINDEX, TROPONINI in the last 168 hours.  HbA1C: HbA1c, POC (prediabetic range)  Date/Time Value Ref Range Status  10/18/2018 10:39 AM 6.0 5.7 - 6.4 % Final   Hgb A1c MFr Bld  Date/Time Value Ref Range Status  11/20/2019 01:00 PM 7.4 (H) 4.8 - 5.6 % Final    Comment:    (NOTE)         Prediabetes: 5.7 - 6.4         Diabetes: >6.4         Glycemic control for adults with diabetes: <7.0   06/16/2019 09:20 PM 7.0 (H) 4.8 - 5.6 % Final    Comment:    (NOTE) Pre diabetes:          5.7%-6.4% Diabetes:              >6.4% Glycemic control for   <7.0% adults with diabetes     CBG: Recent Labs  Lab 01/08/20 0431 01/08/20 0516 01/08/20 0653 01/08/20 0735 01/08/20 0903  GLUCAP 110* 122* 137* 124* 126*    This patient is critically ill with multiple organ system failure which requires frequent high complexity decision making, assessment, support, evaluation, and titration of therapies. This was completed through the application of advanced monitoring technologies and extensive interpretation of multiple databases. During this encounter critical care time was devoted to patient care services described in this note for 36  minutes.    Collier Bullock, MD 01/08/20 9:26 AM [facility]

## 2020-01-09 LAB — COMPREHENSIVE METABOLIC PANEL
ALT: 820 U/L — ABNORMAL HIGH (ref 0–44)
AST: 537 U/L — ABNORMAL HIGH (ref 15–41)
Albumin: <1 g/dL — ABNORMAL LOW (ref 3.5–5.0)
Alkaline Phosphatase: 81 U/L (ref 38–126)
Anion gap: 17 — ABNORMAL HIGH (ref 5–15)
BUN: 79 mg/dL — ABNORMAL HIGH (ref 8–23)
CO2: 18 mmol/L — ABNORMAL LOW (ref 22–32)
Calcium: 7.8 mg/dL — ABNORMAL LOW (ref 8.9–10.3)
Chloride: 96 mmol/L — ABNORMAL LOW (ref 98–111)
Creatinine, Ser: 4.31 mg/dL — ABNORMAL HIGH (ref 0.44–1.00)
GFR calc Af Amer: 12 mL/min — ABNORMAL LOW (ref >60)
GFR calc non Af Amer: 10 mL/min — ABNORMAL LOW (ref >60)
Glucose, Bld: 214 mg/dL — ABNORMAL HIGH (ref 70–99)
Potassium: 5 mmol/L (ref 3.5–5.1)
Sodium: 131 mmol/L — ABNORMAL LOW (ref 135–145)
Total Bilirubin: 0.7 mg/dL (ref 0.3–1.2)
Total Protein: 3.6 g/dL — ABNORMAL LOW (ref 6.5–8.1)

## 2020-01-09 LAB — GLUCOSE, CAPILLARY
Glucose-Capillary: 109 mg/dL — ABNORMAL HIGH (ref 70–99)
Glucose-Capillary: 132 mg/dL — ABNORMAL HIGH (ref 70–99)
Glucose-Capillary: 165 mg/dL — ABNORMAL HIGH (ref 70–99)
Glucose-Capillary: 174 mg/dL — ABNORMAL HIGH (ref 70–99)
Glucose-Capillary: 190 mg/dL — ABNORMAL HIGH (ref 70–99)
Glucose-Capillary: 205 mg/dL — ABNORMAL HIGH (ref 70–99)
Glucose-Capillary: 213 mg/dL — ABNORMAL HIGH (ref 70–99)

## 2020-01-09 LAB — CBC
HCT: 18.1 % — ABNORMAL LOW (ref 36.0–46.0)
HCT: 29.2 % — ABNORMAL LOW (ref 36.0–46.0)
Hemoglobin: 6 g/dL — CL (ref 12.0–15.0)
Hemoglobin: 10.2 g/dL — ABNORMAL LOW (ref 12.0–15.0)
MCH: 30.3 pg (ref 26.0–34.0)
MCH: 29.8 pg (ref 26.0–34.0)
MCHC: 33.1 g/dL (ref 30.0–36.0)
MCHC: 34.9 g/dL (ref 30.0–36.0)
MCV: 91.4 fL (ref 80.0–100.0)
MCV: 85.4 fL (ref 80.0–100.0)
Platelets: UNDETERMINED 10*3/uL (ref 150–400)
Platelets: 94 10*3/uL — ABNORMAL LOW (ref 150–400)
RBC: 1.98 MIL/uL — ABNORMAL LOW (ref 3.87–5.11)
RBC: 3.42 MIL/uL — ABNORMAL LOW (ref 3.87–5.11)
RDW: 17.5 % — ABNORMAL HIGH (ref 11.5–15.5)
RDW: 14.8 % (ref 11.5–15.5)
WBC: 39.4 10*3/uL — ABNORMAL HIGH (ref 4.0–10.5)
WBC: 32.9 10*3/uL — ABNORMAL HIGH (ref 4.0–10.5)
nRBC: 1.7 % — ABNORMAL HIGH (ref 0.0–0.2)
nRBC: 2.3 % — ABNORMAL HIGH (ref 0.0–0.2)

## 2020-01-09 LAB — RENAL FUNCTION PANEL
Albumin: 1.2 g/dL — ABNORMAL LOW (ref 3.5–5.0)
Anion gap: 15 (ref 5–15)
BUN: 60 mg/dL — ABNORMAL HIGH (ref 8–23)
CO2: 19 mmol/L — ABNORMAL LOW (ref 22–32)
Calcium: 7.7 mg/dL — ABNORMAL LOW (ref 8.9–10.3)
Chloride: 99 mmol/L (ref 98–111)
Creatinine, Ser: 3.25 mg/dL — ABNORMAL HIGH (ref 0.44–1.00)
GFR calc Af Amer: 17 mL/min — ABNORMAL LOW (ref >60)
GFR calc non Af Amer: 15 mL/min — ABNORMAL LOW (ref >60)
Glucose, Bld: 105 mg/dL — ABNORMAL HIGH (ref 70–99)
Phosphorus: 5.9 mg/dL — ABNORMAL HIGH (ref 2.5–4.6)
Potassium: 4.3 mmol/L (ref 3.5–5.1)
Sodium: 133 mmol/L — ABNORMAL LOW (ref 135–145)

## 2020-01-09 LAB — PREPARE RBC (CROSSMATCH)

## 2020-01-09 LAB — PROTIME-INR
INR: 1.5 — ABNORMAL HIGH (ref 0.8–1.2)
Prothrombin Time: 17.7 s — ABNORMAL HIGH (ref 11.4–15.2)

## 2020-01-09 LAB — PROCALCITONIN: Procalcitonin: 8.02 ng/mL

## 2020-01-09 MED ORDER — VANCOMYCIN HCL IN DEXTROSE 750-5 MG/150ML-% IV SOLN
750.0000 mg | INTRAVENOUS | Status: DC
  Administered 2020-01-10: 750 mg via INTRAVENOUS
  Filled 2020-01-09: qty 150

## 2020-01-09 MED ORDER — HEPARIN SODIUM (PORCINE) 1000 UNIT/ML DIALYSIS
1000.0000 [IU] | INTRAMUSCULAR | Status: DC | PRN
  Filled 2020-01-09: qty 3
  Filled 2020-01-09: qty 6

## 2020-01-09 MED ORDER — SODIUM CHLORIDE 0.9 % IV SOLN
1.0000 g | Freq: Three times a day (TID) | INTRAVENOUS | Status: DC
  Administered 2020-01-09 – 2020-01-10 (×4): 1 g via INTRAVENOUS
  Filled 2020-01-09 (×6): qty 1

## 2020-01-09 MED ORDER — SODIUM CHLORIDE 0.9% IV SOLUTION: Freq: Once | INTRAVENOUS | Status: AC

## 2020-01-09 MED ORDER — PRISMASOL BGK 4/2.5 32-4-2.5 MEQ/L REPLACEMENT SOLN
Status: DC
  Filled 2020-01-09 (×3): qty 5000

## 2020-01-09 MED ORDER — HEPARIN SODIUM (PORCINE) 5000 UNIT/ML IJ SOLN: 5000.0000 [IU] | Freq: Three times a day (TID) | INTRAMUSCULAR | Status: DC

## 2020-01-09 MED ORDER — LABETALOL HCL 5 MG/ML IV SOLN
10.0000 mg | INTRAVENOUS | Status: DC | PRN
  Administered 2020-01-10 – 2020-01-16 (×15): 10 mg via INTRAVENOUS
  Filled 2020-01-09 (×13): qty 4

## 2020-01-09 MED ORDER — PRISMASOL BGK 4/2.5 32-4-2.5 MEQ/L IV SOLN
INTRAVENOUS | Status: DC
  Filled 2020-01-09 (×19): qty 5000

## 2020-01-09 MED ORDER — PRISMASOL BGK 4/2.5 32-4-2.5 MEQ/L REPLACEMENT SOLN
Status: DC
  Filled 2020-01-09 (×5): qty 5000

## 2020-01-09 NOTE — Progress Notes (Signed)
Patient ID: Patricia [name], female   DOB: 09-Oct-1956, 64 y.o.   MRN: [id number]   [facility] Progress Note   Assessment/ Plan:   1.  Cardiac arrest/HIE: Neuro status not improving; per neuro and CCM; grim prognosis  2. Acute hypoxic respiratory failure on vent per critical care  3. Shock - septic vs. Cardiogenic?  Off of pressors  Cultures were ordered - on table and RN is attempting to have drawn    4. ESRD: previously on HD per TTS schedule.  For now with patient unstable will transition to CRRT and assess needs daily.  I am concerned she would poorly tolerate iHD at this time.  5. Anemia Ckd: Ferritin elevated from #5.  ESA qSat and recently increased dose  6. Leukocytosis - team ordered blood cultures - they have had trouble drawing and are continuing to try  - trend per primary team   7. CKD-MBD: Enteral nutrition.  calcium and phosphorus levels at goal.  8.  COVID-19 infection: Incidental finding and s/p remdesivir. On hydrocortisone. Per CCM     Subjective:   Blood pressure labile per primary team.  Levo is off - was 0.2 mcg on shift change.  Vaso is off as well - no pressor agents.  Now is 280'K systolic per a line.  Blood pressure is very labile per critical care.  Spoke with critical care on 3/29 - they feel has been poorly tolerant of iHD.  RN states blood cultures were ordered and they were not able to obtain.  Review of systems: unable to obtain secondary to intubated     Objective:   BP (!) 142/81   Pulse 85   Temp 97.8 F (36.6 C) (Oral)   Resp 18   Ht 5' 4"  (1.626 m)   Wt 74.4 kg   SpO2 100%   BMI 28.15 kg/m   Physical Exam: General adult female examined through window given covid positive status to conserve PPE in setting of pandemic; spoke in detail with RN. A chart review of other providers notes and the patient's lab work as well as review of other pertinent studies was performed.  Exam details from prior documentation were reviewed  specifically and confirmed with the bedside nurse.  Location of service: [place] adult female examined through window critically ill; spoke in detail with RN HEENT normocephalic atraumatic  Lungs intubated; per nursing coarse breath sounds Heart regular rate off of pressors Extremities no overtly visible edema nursing reports some edema upper extremities  Neuro - per nursing not following commands Access RIJ tunneled catheter per nursing   Labs: BMET Recent Labs  Lab 01/03/20 0350 01/03/20 0350 01/04/20 0702 01/04/20 0702 01/05/20 0500 01/05/20 0500 01/06/20 0533 01/06/20 1158 01/07/20 0255 01/07/20 1520 01/08/20 0434 01/08/20 1027 01/09/20 0350  NA 138   < > 137   < > 136   < > 132* 138 133* 138 136 132* 131*  K 3.8   < > 4.1   < > 4.6   < > 5.3* 3.1* 5.0 5.3* 5.1 5.4* 5.0  CL 102   < > 100  --  97*  --  93*  --  97* 102 102  --  96*  CO2 24   < > 25  --  25  --  23  --  24 15* 18*  --  18*  GLUCOSE 215*   < > 81  --  122*  --  287*  --  223* 222* 115*  --  214*  BUN 41*   < > 68*  --  50*  --  78*  --  46* 56* 63*  --  79*  CREATININE 3.33*   < > 4.38*  --  3.47*  --  4.14*  --  2.74* 3.28* 3.44*  --  4.31*  CALCIUM 7.2*   < > 7.7*  --  7.9*  --  8.1*  --  7.8* 7.3* 7.5*  --  7.8*  PHOS 3.1  --  2.7  --   --   --   --   --   --   --   --   --   --    < > = values in this interval not displayed.   CBC Recent Labs  Lab 01/03/20 0350 01/04/20 0729 01/06/20 0533 01/06/20 1158 01/07/20 0255 01/07/20 0255 01/07/20 1520 01/07/20 2233 01/08/20 0434 01/08/20 1027  WBC 15.9*   < > 9.8  --  15.0*  --  34.9*  --  47.8*  --   NEUTROABS 14.1*  --   --   --   --   --   --   --  32.0*  --   HGB 9.2*   < > 7.7*   < > 7.5*   < > 4.7* 10.1* 9.0* 8.2*  HCT 28.2*   < > 23.3*   < > 22.6*   < > 14.8* 29.2* 25.9* 24.0*  MCV 91.9   < > 89.6  --  88.6  --  94.9  --  89.0  --   PLT 240   < > 242  --  250  --  223  --  192  --    < > = values in this interval not  displayed.      Medications:    . aspirin  81 mg Oral Daily  . chlorhexidine gluconate (MEDLINE KIT)  15 mL Mouth Rinse BID  . Chlorhexidine Gluconate Cloth  6 each Topical Daily  . Chlorhexidine Gluconate Cloth  6 each Topical Q0600  . [START ON 01/13/2020] darbepoetin (ARANESP) injection - DIALYSIS  200 mcg Intravenous Q Sat-HD  . feeding supplement (PRO-STAT SUGAR FREE 64)  30 mL Per Tube BID  . hydrocortisone sod succinate (SOLU-CORTEF) inj  50 mg Intravenous Q6H  . insulin aspart  0-24 Units Subcutaneous Q4H  . mouth rinse  15 mL Mouth Rinse 10 times per day  . multivitamin  1 tablet Per Tube QHS  . pantoprazole (PROTONIX) IV  40 mg Intravenous QHS   Claudia Desanctis , MD 01/09/2020, 6:35 AM

## 2020-01-09 NOTE — Consult Note (Addendum)
Consultation Note Date: 01/09/2020   Patient Name: Patricia [name]  DOB: Dec 25, 1955  MRN: [id number]  Age / Sex: 64 y.o., female  PCP: Ladell Pier, MD Referring Physician: Audria Nine, DO  Reason for Consultation: Establishing goals of care and Psychosocial/spiritual support  HPI/Patient Profile: 64 y.o. female   admitted on 12/11/2019 with PMH of ESRD/dialysis, anemia, HFrEF, Depression, Type 2 DM, HTN and obesity. Patricia [name] is a 64 y.o. F with PMH of  ESRD, anemia, HFrEF, Depression, Type 2 DM, HTN and obesity who presented in cardiac arrest. She lives with family and they state she has been in her usual state of health and went to dialysis today where she had an episode of nausea and vomiting and returned home.  There she became sweaty and collapsed, family started CPR immediately and called EMS who performed 15 minutes of CPR with ROSC, no mention of shockable rhythm.    In the ED, pt was not initially responsive and was intubated.  CT head was without acute findings, patient was hypertensive and febrile with a potassium of 2.8 and prolonged QTC.  No sign of STEMI on EKG.  Lactic acid was elevated and chest x-ray showed vascular congestion.  She was given 2 g of magnesium, broad-spectrum antibiotics and IV fluids.  Covid-19 resulted positive.  PCCM consulted for admission.  Multiple admissions in the past 6 months noted.  Currently patient remains intubated, unable to follow commands/is not on sedation, CRRT has been started, neurology documents extremely poor recovery from a neurologic standpoint.   Family face treatment options decisions, advanced directive decisions, and anticipatory care needs.    Clinical Assessment and Goals of Care:   This NP Wadie Lessen reviewed medical records, received report from team, and then spoke to daughter Joslyn Hy and Khyva Hill/ main contact person to  discuss diagnosis, prognosis, GOC, EOL wishes disposition and options.  Concept of Palliative Care was discussed  Created space and opportunity for family to explore the thoughts and feelings regarding patient's current medical situation.  Family understand the seriousness of the current medical situation however at this time they remain hopeful for improvement, taking it day by day holding onto their faith-based traditions.  A  discussion was had today regarding advanced directives.  Concepts specific to code status, artifical feeding and hydration, continued IV antibiotics and rehospitalization was had.  The difference between a aggressive medical intervention path  and a palliative comfort care path for this patient at this time was had.  Values and goals of care important to patient and family were attempted to be elicited.  Discussion was had regarding the "what if's" if the patient does not begin to show signs of recovery specifically as it relates to consideration of trach and PEG.  Daughter was very hesitant and states "I do not know about all that"  Emotional support offered   Questions and concerns addressed.   Family encouraged to call with questions or concerns.    PMT will continue to support holistically.  NEXT OF KIN    SUMMARY OF RECOMMENDATIONS    Code Status/Advance Care Planning:  DNR   Palliative Prophylaxis:   Aspiration, Bowel Regimen, Frequent Pain Assessment and Oral Care  Additional Recommendations (Limitations, Scope, Preferences):  Full Scope Treatment  Psycho-social/Spiritual:   Desire for further Chaplaincy support:yes  Additional Recommendations: Grief/Bereavement Support  Prognosis:   Long term poor prognosis per neurology, will depend on desire for life prolonging measures  Discharge Planning: To Be Determined      Primary Diagnoses: Present on Admission: . Cardiac arrest ([place])   I have reviewed the medical record, interviewed  the patient and family, and examined the patient. The following aspects are pertinent.  Past Medical History:  Diagnosis Date  . Allergy   . Anemia   . Anxiety   . Blood transfusion without reported diagnosis   . Cardiomyopathy ([place]) 11/2016   no ischemic eval due to CKD  . CHF (congestive heart failure) ([place])   . CKD (chronic kidney disease), stage IV ([place])   . Depression   . Diabetes mellitus without complication ([place])    type 2  . Diverticulitis   . Dyspnea   . Hypertension   . Obesity    Social History   Socioeconomic History  . Marital status: Divorced    Spouse name: Not on file  . Number of children: 2  . Years of education: Not on file  . Highest education level: Not on file  Occupational History  . Occupation: disabled  Tobacco Use  . Smoking status: Never Smoker  . Smokeless tobacco: Never Used  Substance and Sexual Activity  . Alcohol use: No  . Drug use: No  . Sexual activity: Not Currently  Other Topics Concern  . Not on file  Social History Narrative  . Not on file   Social Determinants of Health   Financial Resource Strain:   . Difficulty of Paying Living Expenses:   Food Insecurity:   . Worried About Charity fundraiser in the Last Year:   . Arboriculturist in the Last Year:   Transportation Needs:   . Film/video editor (Medical):   Marland Kitchen Lack of Transportation (Non-Medical):   Physical Activity:   . Days of Exercise per Week:   . Minutes of Exercise per Session:   Stress:   . Feeling of Stress :   Social Connections:   . Frequency of Communication with Friends and Family:   . Frequency of Social Gatherings with Friends and Family:   . Attends Religious Services:   . Active Member of Clubs or Organizations:   . Attends Archivist Meetings:   Marland Kitchen Marital Status:    Family History  Problem Relation Age of Onset  . Diabetes Brother   . Stomach cancer Brother   . Kidney disease Brother   . Heart Problems Maternal Grandmother          pacemaker  . Heart disease Maternal Grandfather   . Rectal cancer Neg Hx   . Esophageal cancer Neg Hx   . Liver cancer Neg Hx   . Colon cancer Neg Hx    Scheduled Meds: . aspirin  81 mg Oral Daily  . chlorhexidine gluconate (MEDLINE KIT)  15 mL Mouth Rinse BID  . Chlorhexidine Gluconate Cloth  6 each Topical Daily  . Chlorhexidine Gluconate Cloth  6 each Topical Q0600  . [START ON 01/13/2020] darbepoetin (ARANESP) injection - DIALYSIS  200 mcg Intravenous Q Sat-HD  .  feeding supplement (PRO-STAT SUGAR FREE 64)  30 mL Per Tube BID  . hydrocortisone sod succinate (SOLU-CORTEF) inj  50 mg Intravenous Q6H  . insulin aspart  0-24 Units Subcutaneous Q4H  . mouth rinse  15 mL Mouth Rinse 10 times per day  . multivitamin  1 tablet Per Tube QHS  . pantoprazole (PROTONIX) IV  40 mg Intravenous QHS   Continuous Infusions: .  prismasol BGK 4/2.5 400 mL/hr at 01/09/20 1144  .  prismasol BGK 4/2.5 300 mL/hr at 01/09/20 1147  . dextrose 35 mL/hr at 01/09/20 1400  . feeding supplement (VITAL 1.5 CAL) 1,000 mL (01/09/20 1451)  . meropenem (MERREM) IV 1 g (01/09/20 1412)  . prismasol BGK 4/2.5 1,800 mL/hr at 01/09/20 1146  . [START ON 01/10/2020] vancomycin     PRN Meds:.fentaNYL (SUBLIMAZE) injection, fentaNYL (SUBLIMAZE) injection, heparin, heparin, hydrALAZINE Medications Prior to Admission:  Prior to Admission medications   Medication Sig Start Date End Date Taking? Authorizing Provider  acetaminophen (TYLENOL) 500 MG tablet Take 1,000 mg by mouth in the morning and at bedtime.   Yes [provider]  albuterol (PROVENTIL HFA;VENTOLIN HFA) 108 (90 Base) MCG/ACT inhaler Inhale 1-2 puffs into the lungs every 6 (six) hours as needed for wheezing or shortness of breath. Patient taking differently: Inhale 1-2 puffs into the lungs every 4 (four) hours as needed for wheezing or shortness of breath.  10/11/18  Yes Marney Setting, NP  Amino Acids-Protein Hydrolys (FEEDING SUPPLEMENT,  PRO-STAT SUGAR FREE 64,) LIQD Take 30 mLs by mouth 2 (two) times daily. 10/18/19  Yes Swayze, Ava, DO  aspirin EC 81 MG tablet Take 1 tablet (81 mg total) by mouth daily. 01/04/17  Yes Langeland, Dawn T, MD  atorvastatin (LIPITOR) 20 MG tablet Take 1/2 (one-half) tablet by mouth once daily Patient taking differently: Take 10 mg by mouth daily.  06/12/19  Yes Ladell Pier, MD  calcitRIOL (ROCALTROL) 0.25 MCG capsule Take 0.25 mcg by mouth daily. 09/12/19  Yes [provider]  carvedilol (COREG) 25 MG tablet TAKE 1 TABLET BY MOUTH TWICE DAILY WITH A MEAL Patient taking differently: Take 12.5 mg by mouth 2 (two) times daily with a meal.  05/04/19  Yes Croitoru, Mihai, MD  citalopram (CELEXA) 20 MG tablet Take 1 tablet (20 mg total) by mouth daily. 12/04/19  Yes Ladell Pier, MD  fluconazole (DIFLUCAN) 200 MG tablet Take 200 mg by mouth daily. 12/18/19  Yes [provider]  gabapentin (NEURONTIN) 100 MG capsule TAKE 1 CAPSULE (100 MG TOTAL) BY MOUTH AT BEDTIME. 12/05/18  Yes Ladell Pier, MD  hydrALAZINE (APRESOLINE) 25 MG tablet Take 1 tablet (25 mg total) by mouth 2 (two) times daily. 12/04/19  Yes Ladell Pier, MD  hydrOXYzine (ATARAX/VISTARIL) 25 MG tablet Take 1 tablet (25 mg total) by mouth daily as needed for anxiety. 12/04/19  Yes Ladell Pier, MD  insulin aspart (NOVOLOG FLEXPEN) 100 UNIT/ML FlexPen Give 2 units with meals if blood sugar > or =300 Patient taking differently: Inject 2 Units into the skin 3 (three) times daily with meals. Give 2 units with meals if blood sugar > or =300 12/04/19  Yes Ladell Pier, MD  Insulin Glargine (LANTUS SOLOSTAR) 100 UNIT/ML Solostar Pen Inject 6 Units into the skin at bedtime. 10/18/19  Yes Swayze, Ava, DO  multivitamin (RENA-VIT) TABS tablet Take 1 tablet by mouth at bedtime. 11/25/19  Yes Sheikh, Omair Latif, DO  promethazine (PHENERGAN) 25 MG tablet Take 25  mg by mouth daily as needed for nausea.  11/17/19  Yes  [provider]  sevelamer (RENAGEL) 800 MG tablet Take 800 mg by mouth 3 (three) times daily with meals.    Yes [provider]  ACCU-CHEK FASTCLIX LANCETS MISC Uad tid Patient taking differently: 1 each by Other route 3 (three) times daily.  10/21/18   Ladell Pier, MD  acetaminophen (TYLENOL) 325 MG tablet Take 2 tablets (650 mg total) by mouth every 6 (six) hours as needed. Patient not taking: Reported on 12/31/2019 05/25/19   Varney Biles, MD  Blood Glucose Monitoring Suppl (ACCU-CHEK GUIDE) w/Device KIT 1 each by Does not apply route 3 (three) times daily. Use tid as directed 10/21/18   Ladell Pier, MD  glucose blood (ACCU-CHEK GUIDE) test strip Use as instructed tid Patient taking differently: 1 each by Other route 3 (three) times daily.  10/21/18   Ladell Pier, MD  Insulin Pen Needle (TRUEPLUS PEN NEEDLES) 32G X 4 MM MISC Use as directed to inject insulin. Patient taking differently: 1 each by Other route daily.  03/25/18   Ladell Pier, MD  Insulin Pen Needle 31G X 5 MM MISC Use as directed Patient taking differently: 1 each by Other route daily.  03/28/18   Ladell Pier, MD  oxyCODONE (OXY IR/ROXICODONE) 5 MG immediate release tablet Take 1 tablet (5 mg total) by mouth every 4 (four) hours as needed for moderate pain or breakthrough pain (Hold & Call MD if SBP<90, HR<65, RR<10, O2<90, or altered mental status.). Patient not taking: Reported on 12/31/2019 10/18/19   Swayze, Ava, DO   Allergies  Allergen Reactions  . Codone [Hydrocodone] Itching  . Norvasc [Amlodipine Besylate] Swelling    Lower extremity  . Dextromethorphan-Guaifenesin Nausea And Vomiting  . Hydralazine Swelling and Other (See Comments)    Leg swelling. Pt takes this as o/p. Patient can take PO now  . Sulfa Antibiotics Itching and Rash   Review of Systems  Unable to perform ROS: Acuity of condition    Physical Exam Constitutional:      Interventions: She is  intubated.  Cardiovascular:     Rate and Rhythm: Normal rate and regular rhythm.  Pulmonary:     Effort: She is intubated.  Skin:    General: Skin is warm and dry.     Vital Signs: BP (!) 186/95   Pulse 79   Temp (!) 94.6 F (34.8 C) (Axillary)   Resp 19   Ht _0  (1.626 m)   Wt 74.4 kg   SpO2 100%   BMI 28.15 kg/m  Pain Scale: CPOT       SpO2: SpO2: 100 % O2 Device:SpO2: 100 % O2 Flow Rate: .   IO: Intake/output summary:   Intake/Output Summary (Last 24 hours) at 01/09/2020 1459 Last data filed at 01/09/2020 1415 Gross per 24 hour  Intake 2632.11 ml  Output 378 ml  Net 2254.11 ml    LBM: Last BM Date: 01/09/20 Baseline Weight: Weight: 62.6 kg Most recent weight: Weight: 74.4 kg     Palliative Assessment/Data:    Discussed with Dr Ruthann Cancer via secure chat and bedside RN  Time In: 0730 Time Out: 0840 Time Total: 70 minutes Greater than 50%  of this time was spent counseling and coordinating care related to the above assessment and plan.  Signed by: Wadie Lessen, NP   Please contact Palliative Medicine Team phone at [phone] for questions and concerns.  For individual provider:  See Amion

## 2020-01-09 NOTE — Progress Notes (Signed)
NAME:  Patricia [name], MRN:  [id number], DOB:  1956-03-24, LOS: 83 ADMISSION DATE:  01/08/2020, CONSULTATION DATE:  01/09/20 REFERRING MD:  Regenia Skeeter CHIEF COMPLAINT:  Cardiac Arrest   Brief History   64 y.o. F with PMH ESRD, anemia, HFrEF, Depression, Type 2 DM, HTN and obesity who presented in cardiac arrest.  Pt was in her usual state of health and went to dialysis 3/20 where she became nauseated and vomited, at home she had a syncopal episode and collapse. CPR initiated and ROSC after 15 mins.    History of present illness   Patricia [name] is a 64 y.o. F with PMH of  ESRD, anemia, HFrEF, Depression, Type 2 DM, HTN and obesity who presented in cardiac arrest. She lives with family and they state she has been in her usual state of health and went to dialysis today where she had an episode of nausea and vomiting and returned home.  There she became sweaty and collapsed, family started CPR immediately and called EMS who performed 15 minutes of CPR with ROSC, no mention of shockable rhythm.    In the ED, pt was not initially responsive and was intubated.  CT head was without acute findings, patient was hypertensive and febrile with a potassium of 2.8 and prolonged QTC.  No sign of STEMI on EKG.  Lactic acid was elevated and chest x-ray showed vascular congestion.  She was given 2 g of magnesium, broad-spectrum antibiotics and IV fluids.  Covid-19 resulted positive.  PCCM consulted for admission.  Family states that she has showed no symptoms of Covid, and they are unsure how she may have contracted this.  Past Medical History   has a past medical history of Allergy, Anemia, Anxiety, Blood transfusion without reported diagnosis, Cardiomyopathy ([place]) (11/2016), CHF (congestive heart failure) ([place]), CKD (chronic kidney disease), stage IV ([place]), Depression, Diabetes mellitus without complication ([place]), Diverticulitis, Dyspnea, Hypertension, and Obesity.   Significant Hospital Events   3/20 admit to  [facility] 3/21 TTM initiated to 33 3/22 completed TTM, rewarmed 3/27: hypertensive prior to HD.  Started hydral, had been on labetelol gtt.  3/28 AM briefly on levophed for hypotension. Became acutely hypotensive in early afternoon, followed by refractory hypotension then cardiac arrest (PEA per nurse).   PEA --> vfib. Defibrillated back to sinus.   Consults:  3/21 Nephrology 3/27 neuro  Procedures:  3/20 ET tube 3/20 CVC > 3/24 11/23/19: R IJ tunneled perm cath:   Significant Diagnostic Tests:  3/20 CT head>> no acute findings 3/21 EEG>> generalized background slowing, no epileptiform activity 3/21 echocardiogram-LVEF 30 to 35%, no regional wall motion abnormalities, moderate LVH.  Grade 1 diastolic dysfunction.  Mildly dilated LA, normal RV. 3/22 EEG-generalized background attenuation with generalized slowing c/w profound diffuse encephalopathy  3/26: MRI IMPRESSION: Small area of acute infarct in the right occipital cortex. This is more likely to be an embolus than anoxic brain injury.  Mild chronic microvascular ischemic changes in the white matter. Scattered areas of microhemorrhage in the brain correlate with hypertension history. 3/29 carotid u/s: Right Carotid: Velocities in the right ICA are consistent with a 1-39%  stenosis.         Atypical and low velocity waveforms visualized.   Left Carotid: Velocities in the left ICA are consistent with a 1-39%  stenosis.        Atypical and low velocity waveforms visualized.   Vertebrals: Bilateral vertebral arteries demonstrate antegrade flow.  Subclavians: Normal flow hemodynamics were seen in bilateral subclavian  arteries.    Micro Data:  3/20 SARS-Cov-2>> positive 3/22 sputum- GNRs> enterobacter cloacae (R cefazolin, otherwise sensitive) 3/20 blood: neg  Antimicrobials:  Cefepime 3/20 Flagyl 3/20  Vancomycin 3/20-3/22, 3/28 Zosyn 3/24>>3/28 merrem 3/28->  Interim history/subjective:   3/30: no acute events reported overnight. BP up and off pressors this am. Spontaneously opening eyes but no tracking/withdrawl or following of commands.  3/29: Cardiac arrest following hypotension 3/28  Objective   Blood pressure 131/71, pulse 85, temperature (!) 96.2 F (35.7 C), temperature source Axillary, resp. rate 17, height 5\' 4"  (1.626 m), weight 74.4 kg, SpO2 100 %.    Vent Mode: PRVC FiO2 (%):  [30 %] 30 % Set Rate:  [18 bmp-24 bmp] 18 bmp Vt Set:  [430 mL] 430 mL PEEP:  [5 cmH20] 5 cmH20 Plateau Pressure:  [15 cmH20-20 cmH20] 15 cmH20   Intake/Output Summary (Last 24 hours) at 01/09/2020 0848 Last data filed at 01/09/2020 0700 Gross per 24 hour  Intake 2183.62 ml  Output --  Net 2183.62 ml   Filed Weights   01/07/20 0500 01/08/20 0500 01/09/20 0500  Weight: 67.6 kg 73.4 kg 74.4 kg    General: critically ill appearing woman laying in bed in NAD, eyes open intermittently HEENT: [place]/AT, eyes anicteric, oral mucosa moist. OGT & ETT. Cardio: Regular rate and rhythm, no murmurs Respiratory: CTAB.  Abdomen: Soft, nontender, nondistended Extremities: No clubbing, cyanosis, or edema. Derm: No rashes or wounds. Neuro: eyes open, not purposeful eye opening, not tracking, Pupils reactive to light  Not withdrawing to pain in any extremity or trapezius squeeze bilaterally.  Not following commands. Does not blink to threat   Resolved Hospital Problem list     Assessment & Plan:  Initial OOHCardiac arrest, PEA/vfib: ROSC in 78min Repeated cardiac arrest 3/28 -Episode of hypotension 3/28, followed by cardiac arrest.  -Unclear cause.  -? sepsis given elevated WBC (will send recheck this am) -Afebrile though, will send pct as well.  -Initial cardiac arrest prior to admission, possibly due to electrolyte disturbance, prolonged QTC.  Given event episode, I wonder about labile blood pressure and or hypotension after HD as outpatient could have caused her out of hospital arrest.    -Stress dose steroids.  -Cont broad spectrum antibiotics.  QTC prolongation  PTA on phenergan. -Qtc remained prolonged at last ekg 3/25 -Per cardiology note she has a history of prolonged QT interval, usually 480-510 ms -Last stress test in 2018 did not show evidence of ischemia she does not have a history of ventricular arrhythmia P: -Continue avoiding all QTC prolonging medications HFrEF  Acutely worsened systolic function post-arrest. Bradycardia resolved post- rewarming. -Echocardiogram 1 year ago with EF of 40 to 45% -She has recently been taken off all home antihypertensive medication except hydralazine due to borderline low blood pressure -Follows with Dr. Sallyanne Kuster H/o HTN with recent shock (multifactorial, cardiogenic/septic):  BP very labile.   Off pressors at this time.  -if bp remains stable, esp after blood will wean steroids (from stress dosing) HLD Lipitor  Transaminitis: -Shock liver.  -improved -INR elevated but improved   DM2 with Hypoglycemia now hyperglycemic:  -Decrease d10 and resume tf. May be able to stop d10 today.    Anoxic encephalopathy -Completed TTM to 33 degrees after initial out of hospital arrest.  -Continue to avoid all sedation.  Fentanyl as needed ordered if evidence of pain -on review by neurology and radiology: anoxic injury seen on MRI Prognosis poor and do not expect significant return of function.  Cont discussions with family.  - Palliative care consulted and appreciated  Acute Stroke:  Small area of acute infarct in the right occipital cortex.  Embolic?  No evidence of clot on Echo.  Consider TEE when more stable.   Neuro consult appreciated.  Carotid doppler without stenosis -asa ok to continue -hold heparin in light of decreased hgb today.   Acute respiratory failure requiring mechanical ventilation post cardiac arrest. LLL pneumonia due to Enterobacter likely from aspiration during cardiac arrest. Tolerating minimal vent  support.  -Continue daily SBT.  Continue to hold sedation. (mental status is precluding extubation at this time) -VAP prevention protocol -on BS antibiotics now for possible sepsis.  Covid-19 infection -Patient with low-grade fever on arrival, no URI symptoms per family  P: -completed 10 days of steroids for covid... Broadened to stress dose steroids for shock.   -Previously completed remdesivir. -On heparin 7500 TID for ppx - cont to hold today given acute hgb drop  Acute Anemia:  hgb down to 6 Still no overt bleeding noted.  Hold heparin Transfuse today  F/E/N: P: -resume tube feeds -Continue to monitor electrolytes  ESRD -T/TH/sat schedule -Per family it sounds like she completed most of her dialysis 3/20 P: -Appreciate nephrology's assistance. Last HD 3/28 Plan for CRRT on Tuesday will depend on overall plan of care.  -Encouraging family to consider comfort at this point.      Best practice:  Diet: TF Pain/Anxiety/Delirium protocol (if indicated): avoid sedation meds VAP protocol (if indicated): per protocol DVT prophy: holding heparin in light of acute anemia GI prophylaxis: Protonix Glucose control: SSI Mobility: Bedrest Code Status: Full code   Family Communication: spoke with Rynesha and Khyva via phone on [employer] call. All questions answered to best of my ability Disposition: ICU  Labs   CBC: Recent Labs  Lab 01/03/20 0350 01/03/20 0350 01/04/20 0729 01/04/20 0729 01/06/20 0533 01/06/20 1158 01/07/20 0255 01/07/20 1520 01/07/20 2233 01/08/20 0434 01/08/20 1027  WBC 15.9*   < > 17.0*  --  9.8  --  15.0* 34.9*  --  47.8*  --   NEUTROABS 14.1*  --   --   --   --   --   --   --   --  32.0*  --   HGB 9.2*   < > 8.8*   < > 7.7*   < > 7.5* 4.7* 10.1* 9.0* 8.2*  HCT 28.2*   < > 27.1*   < > 23.3*   < > 22.6* 14.8* 29.2* 25.9* 24.0*  MCV 91.9   < > 89.4  --  89.6  --  88.6 94.9  --  89.0  --   PLT 240   < > 257  --  242  --  250 223  --  192  --    < > =  values in this interval not displayed.    Basic Metabolic Panel: Recent Labs  Lab 01/03/20 0350 01/03/20 0350 01/04/20 0702 01/05/20 0500 01/06/20 0533 01/06/20 1158 01/07/20 0255 01/07/20 1520 01/08/20 0434 01/08/20 1027 01/09/20 0350  NA 138   < > 137   < > 132*   < > 133* 138 136 132* 131*  K 3.8   < > 4.1   < > 5.3*   < > 5.0 5.3* 5.1 5.4* 5.0  CL 102   < > 100   < > 93*  --  97* 102 102  --  96*  CO2 24   < >  25   < > 23  --  24 15* 18*  --  18*  GLUCOSE 215*   < > 81   < > 287*  --  223* 222* 115*  --  214*  BUN 41*   < > 68*   < > 78*  --  46* 56* 63*  --  79*  CREATININE 3.33*   < > 4.38*   < > 4.14*  --  2.74* 3.28* 3.44*  --  4.31*  CALCIUM 7.2*   < > 7.7*   < > 8.1*  --  7.8* 7.3* 7.5*  --  7.8*  MG 1.9  --   --   --   --   --   --   --   --   --   --   PHOS 3.1  --  2.7  --   --   --   --   --   --   --   --    < > = values in this interval not displayed.   GFR: Estimated Creatinine Clearance: 13.5 mL/min (A) (by C-G formula based on SCr of 4.31 mg/dL (H)). Recent Labs  Lab 01/06/20 0533 01/07/20 0255 01/07/20 1520 01/08/20 0434  WBC 9.8 15.0* 34.9* 47.8*    Liver Function Tests: Recent Labs  Lab 01/06/20 0533 01/07/20 0255 01/07/20 1520 01/08/20 0434 01/09/20 0350  AST 30 28 1,652* 2,683* 537*  ALT 21 20 1,089* 1,504* 820*  ALKPHOS 81 80 71 73 81  BILITOT 0.6 0.6 0.7 1.1 0.7  PROT 4.7* 4.7* 3.6* 3.9* 3.6*  ALBUMIN 1.2* 1.2* <1.0* 1.1* <1.0*   No results for input(s): LIPASE, AMYLASE in the last 168 hours. No results for input(s): AMMONIA in the last 168 hours.  ABG    Component Value Date/Time   PHART 7.509 (H) 01/08/2020 1027   PCO2ART 21.6 (L) 01/08/2020 1027   PO2ART 70.0 (L) 01/08/2020 1027   HCO3 17.1 (L) 01/08/2020 1027   TCO2 18 (L) 01/08/2020 1027   ACIDBASEDEF 5.0 (H) 01/08/2020 1027   O2SAT 95.0 01/08/2020 1027     Coagulation Profile: Recent Labs  Lab 01/07/20 1707 01/09/20 0350  INR 2.0* 1.5*    Cardiac  Enzymes: No results for input(s): CKTOTAL, CKMB, CKMBINDEX, TROPONINI in the last 168 hours.  HbA1C: HbA1c, POC (prediabetic range)  Date/Time Value Ref Range Status  10/18/2018 10:39 AM 6.0 5.7 - 6.4 % Final   Hgb A1c MFr Bld  Date/Time Value Ref Range Status  11/20/2019 01:00 PM 7.4 (H) 4.8 - 5.6 % Final    Comment:    (NOTE)         Prediabetes: 5.7 - 6.4         Diabetes: >6.4         Glycemic control for adults with diabetes: <7.0   06/16/2019 09:20 PM 7.0 (H) 4.8 - 5.6 % Final    Comment:    (NOTE) Pre diabetes:          5.7%-6.4% Diabetes:              >6.4% Glycemic control for   <7.0% adults with diabetes     CBG: Recent Labs  Lab 01/08/20 1518 01/08/20 2007 01/08/20 2345 01/09/20 0340 01/09/20 0709  GLUCAP 170* 187* 205* 213* 165*    This patient is critically ill with multiple organ system failure which requires frequent high complexity decision making, assessment, support, evaluation, and titration of therapies. This was completed  through the application of advanced monitoring technologies and extensive interpretation of multiple databases. During this encounter critical care time was devoted to patient care services described in this note for 38 minutes.    Audria Nine, MD 01/09/20 8:48 AM [facility]

## 2020-01-09 NOTE — Progress Notes (Signed)
CRITICAL VALUE ALERT  Critical Value:  Hgb 6  Date & Time Notied:  01/09/20 @ 0900  Provider Notified: Dr. Ruthann Cancer  Orders Received/Actions taken: Blood transfusion and follow up CBC.

## 2020-01-09 NOTE — Progress Notes (Signed)
[facility] Progress Note Patient Name: Patricia [name] DOB: 23-Jun-1956 MRN: [id number]   Date of Service  01/09/2020  HPI/Events of Note  Hyperglycemia - Blood glucose = 213.   eICU Interventions  Will order: 1. Decrease D10W IV infusion to 35 mL/hour.      Intervention Category Major Interventions: Hyperglycemia - active titration of insulin therapy  Zyrion Coey [place] 01/09/2020, 4:00 AM

## 2020-01-09 NOTE — Progress Notes (Signed)
Patient moved from room 3M03 to 3M11 on the ventilator with no problems.

## 2020-01-09 NOTE — Progress Notes (Signed)
Pharmacy Antibiotic Note  Patricia [name] is a 64 y.o. female admitted on 12/15/2019 with sepsis.  Pharmacy has been consulted for meropenem and vancomycin dosing.  Patient has ESRD on HD; however, she is unstable and to start CRRT.  Afebrile, WBC 47.8.  Plan: Change vanc to 750mg  IV Q24H Change Merrem to 1gm IV Q8H Monitor CRRT tolerance/interruptions, clinical progress, vanc level as indicated  Height: 5\' 4"  (162.6 cm) Weight: 164 lb 0.4 oz (74.4 kg) IBW/kg (Calculated) : 54.7  Temp (24hrs), Avg:98.2 F (36.8 C), Min:96.2 F (35.7 C), Max:99.6 F (37.6 C)  Recent Labs  Lab 01/04/20 0729 01/05/20 0500 01/06/20 0533 01/07/20 0255 01/07/20 1520 01/08/20 0434 01/09/20 0350  WBC 17.0*  --  9.8 15.0* 34.9* 47.8*  --   CREATININE  --    < > 4.14* 2.74* 3.28* 3.44* 4.31*   < > = values in this interval not displayed.    Estimated Creatinine Clearance: 13.5 mL/min (A) (by C-G formula based on SCr of 4.31 mg/dL (H)).    Allergies  Allergen Reactions  . Codone [Hydrocodone] Itching  . Norvasc [Amlodipine Besylate] Swelling    Lower extremity  . Dextromethorphan-Guaifenesin Nausea And Vomiting  . Hydralazine Swelling and Other (See Comments)    Leg swelling. Pt takes this as o/p. Patient can take PO now  . Sulfa Antibiotics Itching and Rash    Vanc 3/20>>3/22, restarted 3/28 >> Merrem 3/28 >> Cefepime 3/20>>3/22 Zosyn 3/22>> Flagyl x 1 3/20  Remdesivir 3/21 >> 3/25  3/20 COVID - positive 3/20 BCx - negative 3/21 MRSA - negative 3/23 RA - Enterobacter (S Fortaz, Cipro, Primaxin, Septra, Zosyn) 3/28 BCx - ?canceled  Patricia [name] D. Mina Marble, PharmD, BCPS, [place] 01/09/2020, 12:14 PM

## 2020-01-10 DIAGNOSIS — Z978 Presence of other specified devices: Secondary | ICD-10-CM

## 2020-01-10 DIAGNOSIS — Z515 Encounter for palliative care: Secondary | ICD-10-CM | POA: Diagnosis not present

## 2020-01-10 DIAGNOSIS — G931 Anoxic brain damage, not elsewhere classified: Secondary | ICD-10-CM | POA: Diagnosis not present

## 2020-01-10 DIAGNOSIS — Z66 Do not resuscitate: Secondary | ICD-10-CM

## 2020-01-10 LAB — BPAM RBC
Blood Product Expiration Date: 202104182359
Blood Product Expiration Date: 202104192359
Blood Product Expiration Date: 202104192359
Blood Product Expiration Date: 202104272359
ISSUE DATE / TIME: 202103281811
ISSUE DATE / TIME: 202103281957
ISSUE DATE / TIME: 202103300930
ISSUE DATE / TIME: 202103301231
Unit Type and Rh: 7300
Unit Type and Rh: 7300
Unit Type and Rh: 7300
Unit Type and Rh: 7300

## 2020-01-10 LAB — RENAL FUNCTION PANEL
Albumin: 1.2 g/dL — ABNORMAL LOW (ref 3.5–5.0)
Albumin: 1.2 g/dL — ABNORMAL LOW (ref 3.5–5.0)
Anion gap: 14 (ref 5–15)
Anion gap: 11 (ref 5–15)
BUN: 36 mg/dL — ABNORMAL HIGH (ref 8–23)
BUN: 32 mg/dL — ABNORMAL HIGH (ref 8–23)
CO2: 21 mmol/L — ABNORMAL LOW (ref 22–32)
CO2: 22 mmol/L (ref 22–32)
Calcium: 7.6 mg/dL — ABNORMAL LOW (ref 8.9–10.3)
Calcium: 7.4 mg/dL — ABNORMAL LOW (ref 8.9–10.3)
Chloride: 99 mmol/L (ref 98–111)
Chloride: 102 mmol/L (ref 98–111)
Creatinine, Ser: 2.29 mg/dL — ABNORMAL HIGH (ref 0.44–1.00)
Creatinine, Ser: 1.86 mg/dL — ABNORMAL HIGH (ref 0.44–1.00)
GFR calc Af Amer: 26 mL/min — ABNORMAL LOW (ref >60)
GFR calc Af Amer: 33 mL/min — ABNORMAL LOW (ref >60)
GFR calc non Af Amer: 22 mL/min — ABNORMAL LOW (ref >60)
GFR calc non Af Amer: 29 mL/min — ABNORMAL LOW (ref >60)
Glucose, Bld: 167 mg/dL — ABNORMAL HIGH (ref 70–99)
Glucose, Bld: 306 mg/dL — ABNORMAL HIGH (ref 70–99)
Phosphorus: 4.1 mg/dL (ref 2.5–4.6)
Phosphorus: 3.7 mg/dL (ref 2.5–4.6)
Potassium: 4 mmol/L (ref 3.5–5.1)
Potassium: 3.8 mmol/L (ref 3.5–5.1)
Sodium: 134 mmol/L — ABNORMAL LOW (ref 135–145)
Sodium: 135 mmol/L (ref 135–145)

## 2020-01-10 LAB — CBC
HCT: 27.1 % — ABNORMAL LOW (ref 36.0–46.0)
Hemoglobin: 9.5 g/dL — ABNORMAL LOW (ref 12.0–15.0)
MCH: 30 pg (ref 26.0–34.0)
MCHC: 35.1 g/dL (ref 30.0–36.0)
MCV: 85.5 fL (ref 80.0–100.0)
Platelets: 89 10*3/uL — ABNORMAL LOW (ref 150–400)
RBC: 3.17 MIL/uL — ABNORMAL LOW (ref 3.87–5.11)
RDW: 16 % — ABNORMAL HIGH (ref 11.5–15.5)
WBC: 28.5 10*3/uL — ABNORMAL HIGH (ref 4.0–10.5)
nRBC: 3.7 % — ABNORMAL HIGH (ref 0.0–0.2)

## 2020-01-10 LAB — GLUCOSE, CAPILLARY
Glucose-Capillary: 159 mg/dL — ABNORMAL HIGH (ref 70–99)
Glucose-Capillary: 152 mg/dL — ABNORMAL HIGH (ref 70–99)
Glucose-Capillary: 249 mg/dL — ABNORMAL HIGH (ref 70–99)
Glucose-Capillary: 322 mg/dL — ABNORMAL HIGH (ref 70–99)
Glucose-Capillary: 271 mg/dL — ABNORMAL HIGH (ref 70–99)
Glucose-Capillary: 210 mg/dL — ABNORMAL HIGH (ref 70–99)

## 2020-01-10 LAB — TYPE AND SCREEN
ABO/RH(D): B POS
Antibody Screen: NEGATIVE
Unit division: 0
Unit division: 0
Unit division: 0
Unit division: 0

## 2020-01-10 LAB — PROCALCITONIN: Procalcitonin: 4.24 ng/mL

## 2020-01-10 LAB — MAGNESIUM: Magnesium: 2.1 mg/dL (ref 1.7–2.4)

## 2020-01-10 MED ORDER — INSULIN ASPART 100 UNIT/ML ~~LOC~~ SOLN
0.0000 [IU] | SUBCUTANEOUS | Status: DC
  Administered 2020-01-10: 13:00:00 3 [IU] via SUBCUTANEOUS
  Administered 2020-01-10: 7 [IU] via SUBCUTANEOUS
  Administered 2020-01-10: 5 [IU] via SUBCUTANEOUS
  Administered 2020-01-10: 3 [IU] via SUBCUTANEOUS
  Administered 2020-01-11 (×2): 2 [IU] via SUBCUTANEOUS

## 2020-01-10 MED ORDER — VANCOMYCIN VARIABLE DOSE PER UNSTABLE RENAL FUNCTION (PHARMACIST DOSING): Status: DC

## 2020-01-10 MED ORDER — LABETALOL HCL 100 MG PO TABS
200.0000 mg | ORAL_TABLET | Freq: Two times a day (BID) | ORAL | Status: DC
  Administered 2020-01-10: 200 mg via ORAL
  Filled 2020-01-10: qty 2

## 2020-01-10 MED ORDER — POTASSIUM CHLORIDE CRYS ER 20 MEQ PO TBCR: 10.0000 meq | EXTENDED_RELEASE_TABLET | Freq: Once | ORAL | Status: DC

## 2020-01-10 MED ORDER — POTASSIUM CHLORIDE 20 MEQ/15ML (10%) PO SOLN: 10.0000 meq | Freq: Once | ORAL | Status: DC

## 2020-01-10 MED ORDER — OXYCODONE HCL 5 MG/5ML PO SOLN: 5.0000 mg | Freq: Four times a day (QID) | ORAL | Status: DC | PRN

## 2020-01-10 MED ORDER — HYDROCORTISONE NA SUCCINATE PF 100 MG IJ SOLR
50.0000 mg | Freq: Two times a day (BID) | INTRAMUSCULAR | Status: DC
  Administered 2020-01-10 – 2020-01-11 (×2): 50 mg via INTRAVENOUS
  Filled 2020-01-10 (×2): qty 2

## 2020-01-10 MED ORDER — CARVEDILOL 12.5 MG PO TABS
6.2500 mg | ORAL_TABLET | Freq: Two times a day (BID) | ORAL | Status: DC
  Administered 2020-01-10 – 2020-01-15 (×10): 6.25 mg via ORAL
  Filled 2020-01-10 (×11): qty 1

## 2020-01-10 NOTE — Progress Notes (Signed)
Pharmacy Antibiotic Note  Patricia [name] is a 64 y.o. female admitted on 12/26/2019 with sepsis.  Pharmacy has been consulted for meropenem and vancomycin dosing.  Patient was on CRRT but it clotted at ~1815 tonight with no plans to resume at this moment. Discontinued scheduled vancomycin and meropenem pending further dialysis plans. Last dose vancomycin @ 12:30 and meropenem at 1430, will have sufficient levels for at least 24 hrs.   Plan: Stop scheduled vancomycin and meropenem F/u dialysis plans, clinical status, GOC    Height: 5\' 4"  (162.6 cm) Weight: 163 lb 12.8 oz (74.3 kg) IBW/kg (Calculated) : 54.7  Temp (24hrs), Avg:96.4 F (35.8 C), Min:94.1 F (34.5 C), Max:97.7 F (36.5 C)  Recent Labs  Lab 01/07/20 1520 01/07/20 1520 01/08/20 0434 01/09/20 0350 01/09/20 1600 01/10/20 0430 01/10/20 1744  WBC 34.9*  --  47.8* 39.4* 32.9* 28.5*  --   CREATININE 3.28*   < > 3.44* 4.31* 3.25* 2.29* 1.86*   < > = values in this interval not displayed.    Estimated Creatinine Clearance: 31.3 mL/min (A) (by C-G formula based on SCr of 1.86 mg/dL (H)).    Allergies  Allergen Reactions  . Codone [Hydrocodone] Itching  . Norvasc [Amlodipine Besylate] Swelling    Lower extremity  . Dextromethorphan-Guaifenesin Nausea And Vomiting  . Hydralazine Swelling and Other (See Comments)    Leg swelling. Pt takes this as o/p. Patient can take PO now  . Sulfa Antibiotics Itching and Rash    Vanc 3/20>>3/22, restarted 3/28 >> Merrem 3/28 >> Cefepime 3/20>>3/22 Zosyn 3/22>> Flagyl x 1 3/20  Remdesivir 3/21 >> 3/25  3/20 COVID - positive 3/20 BCx - negative 3/21 MRSA - negative 3/23 RA - Enterobacter (S Fortaz, Cipro, Primaxin, Septra, Zosyn) 3/28 BCx - ?canceled  Benetta Spar, PharmD, BCPS, [facility] Clinical Pharmacist  Please check AMION for all [place] phone numbers After 10:00 PM, call [place] [phone]

## 2020-01-10 NOTE — Progress Notes (Signed)
NAME:  Patricia [name], MRN:  [id number], DOB:  1955-12-05, LOS: 43 ADMISSION DATE:  01/05/2020, CONSULTATION DATE:  01/10/20 REFERRING MD:  Regenia Skeeter CHIEF COMPLAINT:  Cardiac Arrest   Brief History   64 y.o. F with PMH ESRD, anemia, HFrEF, Depression, Type 2 DM, HTN and obesity who presented in cardiac arrest.  Pt was in her usual state of health and went to dialysis 3/20 where she became nauseated and vomited, at home she had a syncopal episode and collapse. CPR initiated and ROSC after 15 mins.    History of present illness   Patricia [name] is a 63 y.o. F with PMH of  ESRD, anemia, HFrEF, Depression, Type 2 DM, HTN and obesity who presented in cardiac arrest. She lives with family and they state she has been in her usual state of health and went to dialysis today where she had an episode of nausea and vomiting and returned home.  There she became sweaty and collapsed, family started CPR immediately and called EMS who performed 15 minutes of CPR with ROSC, no mention of shockable rhythm.    In the ED, pt was not initially responsive and was intubated.  CT head was without acute findings, patient was hypertensive and febrile with a potassium of 2.8 and prolonged QTC.  No sign of STEMI on EKG.  Lactic acid was elevated and chest x-ray showed vascular congestion.  She was given 2 g of magnesium, broad-spectrum antibiotics and IV fluids.  Covid-19 resulted positive.  PCCM consulted for admission.  Family states that she has showed no symptoms of Covid, and they are unsure how she may have contracted this.  Past Medical History   has a past medical history of Allergy, Anemia, Anxiety, Blood transfusion without reported diagnosis, Cardiomyopathy ([place]) (11/2016), CHF (congestive heart failure) ([place]), CKD (chronic kidney disease), stage IV ([place]), Depression, Diabetes mellitus without complication ([place]), Diverticulitis, Dyspnea, Hypertension, and Obesity.   Significant Hospital Events   3/20 admit to  [facility] 3/21 TTM initiated to 33 3/22 completed TTM, rewarmed 3/27: hypertensive prior to HD.  Started hydral, had been on labetelol gtt.  3/28 AM briefly on levophed for hypotension. Became acutely hypotensive in early afternoon, followed by refractory hypotension then cardiac arrest (PEA per nurse).   PEA --> vfib. Defibrillated back to sinus.   Consults:  3/21 Nephrology 3/27 neuro  Procedures:  3/20 ET tube 3/20 CVC > 3/24 11/23/19: R IJ tunneled perm cath:   Significant Diagnostic Tests:  3/20 CT head>> no acute findings 3/21 EEG>> generalized background slowing, no epileptiform activity 3/21 echocardiogram-LVEF 30 to 35%, no regional wall motion abnormalities, moderate LVH.  Grade 1 diastolic dysfunction.  Mildly dilated LA, normal RV. 3/22 EEG-generalized background attenuation with generalized slowing c/w profound diffuse encephalopathy  3/26: MRI IMPRESSION: Small area of acute infarct in the right occipital cortex. This is more likely to be an embolus than anoxic brain injury.  Mild chronic microvascular ischemic changes in the white matter. Scattered areas of microhemorrhage in the brain correlate with hypertension history. 3/29 carotid u/s: Right Carotid: Velocities in the right ICA are consistent with a 1-39%  stenosis.         Atypical and low velocity waveforms visualized.   Left Carotid: Velocities in the left ICA are consistent with a 1-39%  stenosis.        Atypical and low velocity waveforms visualized.   Vertebrals: Bilateral vertebral arteries demonstrate antegrade flow.  Subclavians: Normal flow hemodynamics were seen in bilateral subclavian  arteries.    Micro Data:  3/20 SARS-Cov-2>> positive 3/22 sputum- GNRs> enterobacter cloacae (R cefazolin, otherwise sensitive) 3/20 blood: neg  Antimicrobials:  Cefepime 3/20 Flagyl 3/20  Vancomycin 3/20-3/22, 3/28 Zosyn 3/24>>3/28 merrem 3/28->  Interim history/subjective:   3/31: no reported events overnight. Tolerated crrt. Remains hypertensive. Still poorly/minimally responsive. Does have faint shoulder shrug with deep sternal rub otherwise no change.  3/30: no acute events reported overnight. BP up and off pressors this am. Spontaneously opening eyes but no tracking/withdrawl or following of commands.  3/29: Cardiac arrest following hypotension 3/28  Objective   Blood pressure (!) 181/84, pulse 82, temperature (!) 97.5 F (36.4 C), temperature source Axillary, resp. rate 15, height 5\' 4"  (1.626 m), weight 74.3 kg, SpO2 100 %.    Vent Mode: PRVC FiO2 (%):  [30 %] 30 % Set Rate:  [18 bmp] 18 bmp Vt Set:  [430 mL] 430 mL PEEP:  [5 cmH20] 5 cmH20 Plateau Pressure:  [15 cmH20-18 cmH20] 18 cmH20   Intake/Output Summary (Last 24 hours) at 01/10/2020 1031 Last data filed at 01/10/2020 1000 Gross per 24 hour  Intake 2782.65 ml  Output 3408 ml  Net -625.35 ml   Filed Weights   01/08/20 0500 01/09/20 0500 01/10/20 0500  Weight: 73.4 kg 74.4 kg 74.3 kg    General: critically ill appearing woman laying in bed in NAD, eyes open intermittently NOT tracking or blinking to threat HEENT: [place]/AT, eyes anicteric, oral mucosa moist. OGT & ETT. Cardio: Regular rate and rhythm, no murmurs Respiratory: CTAB.  Abdomen: Soft, nontender, nondistended Extremities: No clubbing, cyanosis, or edema. Derm: No rashes or wounds. Neuro: eyes open, not purposeful eye opening, not tracking, Pupils reactive to light  Not withdrawing to pain in any extremity does shrug shoulders faintly with deep sternal rub. Not following commands. Does not blink to threat   Resolved Hospital Problem list     Assessment & Plan:  Initial OOHCardiac arrest, PEA/vfib: ROSC in 64min Repeated cardiac arrest 3/28 -Episode of hypotension 3/28, followed by cardiac arrest.  -Unclear cause.  -? sepsis given elevated WBC (downward trend) -Afebrile and pct downtrending 21->8->4 -Initial cardiac arrest  prior to admission, possibly due to electrolyte disturbance, prolonged QTC.  Given event episode, I wonder about labile blood pressure and or hypotension after HD as outpatient could have caused her out of hospital arrest.   -Stress dose steroids weaning today -Cont broad spectrum antibiotics.  QTC prolongation  PTA on phenergan. -Qtc remained prolonged at last ekg 3/25 -Per cardiology note she has a history of prolonged QT interval, usually 480-510 ms -Last stress test in 2018 did not show evidence of ischemia she does not have a history of ventricular arrhythmia P: -Continue avoiding all QTC prolonging medications HFrEF  Acutely worsened systolic function post-arrest. Bradycardia resolved post- rewarming. -Echocardiogram 1 year ago with EF of 40 to 45% -She has recently been taken off all home antihypertensive medication except hydralazine due to borderline low blood pressure -Follows with Dr. Sallyanne Kuster H/o HTN with recent shock (multifactorial, cardiogenic/septic):  BP very labile.   Off pressors at this time.  -weaning stress steroids today -resume home coreg but decreased dose, cont prn hydral and labetalol for now HLD Lipitor  Transaminitis: -Shock liver.  -improved -INR elevated but improved   DM2 with Hypoglycemia now hyperglycemic:  -stop d10 as pt has been getting insulin coverage instead of stopping -change insulin coverage to appropriate scale.    Anoxic encephalopathy -Completed TTM to 33 degrees after initial  out of hospital arrest.  -Continue to avoid all sedation.  Fentanyl as needed ordered if evidence of pain -on review by neurology and radiology: anoxic injury seen on MRI Prognosis poor and do not expect significant return of function.   -Cont discussions with family. Spoke with Ms. Hill today re: potential trach vs comfort care. She is appreciative of the information and will discuss with her sister and support system. She indicated that her mother would not  want (and had previously expressed she would not go to) nursing home placement.  - Palliative care consulted and appreciated.. do not see note at this time. Will f/u  Acute Stroke:  Small area of acute infarct in the right occipital cortex.  Embolic?  No evidence of clot on Echo.  Consider TEE when more stable.   Neuro consult appreciated: low chance of meaningful recovery  -Carotid doppler without stenosis -asa ok to continue -hold heparin in light of plts  Acute respiratory failure requiring mechanical ventilation post cardiac arrest. LLL pneumonia due to Enterobacter likely from aspiration during cardiac arrest. Tolerating minimal vent support.  -Continue daily SBT.  Continue to hold sedation. (mental status is precluding extubation at this time) -VAP prevention protocol -on BS antibiotics now for possible sepsis.  Covid-19 infection -Patient with low-grade fever on arrival, no URI symptoms per family  P: -completed 10 days of steroids for covid... Broadened to stress dose steroids for shock. (weaning as of 3/31)  -Previously completed remdesivir. -On heparin 7500 TID for ppx - cont to hold today given plts  Acute Anemia:  hgb with inappropriately elevated response from 2U prbc 6->10 Still no overt bleeding noted.  Hold heparin Thrombocytopenia:  -on crrt -holding heparin restart until >100K  F/E/N: P: -tolerating tube feeds -Continue to monitor electrolytes -stop d10  ESRD -T/TH/sat schedule -Per family it sounds like she completed most of her dialysis 3/20 P: -Appreciate nephrology's assistance. -remains on crrt at this time, per nephro note will transition to ihd if clots -net -55ml/hr     Best practice:  Diet: TF Pain/Anxiety/Delirium protocol (if indicated): avoid sedation meds VAP protocol (if indicated): per protocol DVT prophy: holding heparin in light of acute anemia GI prophylaxis: Protonix Glucose control: SSI Mobility: Bedrest Code Status:  DNR Family Communication:spoke with Franchot Mimes via phone this am.  Disposition: ICU  Labs   CBC: Recent Labs  Lab 01/07/20 1520 01/07/20 2233 01/08/20 0434 01/08/20 1027 01/09/20 0350 01/09/20 1600 01/10/20 0430  WBC 34.9*  --  47.8*  --  39.4* 32.9* 28.5*  NEUTROABS  --   --  32.0*  --   --   --   --   HGB 4.7*   < > 9.0* 8.2* 6.0* 10.2* 9.5*  HCT 14.8*   < > 25.9* 24.0* 18.1* 29.2* 27.1*  MCV 94.9  --  89.0  --  91.4 85.4 85.5  PLT 223  --  192  --  PLATELET CLUMPS NOTED ON SMEAR, UNABLE TO ESTIMATE 94* 89*   < > = values in this interval not displayed.    Basic Metabolic Panel: Recent Labs  Lab 01/04/20 0702 01/05/20 0500 01/07/20 1520 01/07/20 1520 01/08/20 0434 01/08/20 1027 01/09/20 0350 01/09/20 1600 01/10/20 0430  NA 137   < > 138   < > 136 132* 131* 133* 134*  K 4.1   < > 5.3*   < > 5.1 5.4* 5.0 4.3 4.0  CL 100   < > 102  --  102  --  96* 99 99  CO2 25   < > 15*  --  18*  --  18* 19* 21*  GLUCOSE 81   < > 222*  --  115*  --  214* 105* 167*  BUN 68*   < > 56*  --  63*  --  79* 60* 36*  CREATININE 4.38*   < > 3.28*  --  3.44*  --  4.31* 3.25* 2.29*  CALCIUM 7.7*   < > 7.3*  --  7.5*  --  7.8* 7.7* 7.6*  MG  --   --   --   --   --   --   --   --  2.1  PHOS 2.7  --   --   --   --   --   --  5.9* 4.1   < > = values in this interval not displayed.   GFR: Estimated Creatinine Clearance: 25.5 mL/min (A) (by C-G formula based on SCr of 2.29 mg/dL (H)). Recent Labs  Lab 01/08/20 0434 01/09/20 0350 01/09/20 0957 01/09/20 1600 01/10/20 0430  PROCALCITON  --   --  8.02  --  4.24  WBC 47.8* 39.4*  --  32.9* 28.5*    Liver Function Tests: Recent Labs  Lab 01/06/20 0533 01/06/20 0533 01/07/20 0255 01/07/20 0255 01/07/20 1520 01/08/20 0434 01/09/20 0350 01/09/20 1600 01/10/20 0430  AST 30  --  28  --  1,652* 2,683* 537*  --   --   ALT 21  --  20  --  1,089* 1,504* 820*  --   --   ALKPHOS 81  --  80  --  71 73 81  --   --   BILITOT 0.6  --  0.6  --   0.7 1.1 0.7  --   --   PROT 4.7*  --  4.7*  --  3.6* 3.9* 3.6*  --   --   ALBUMIN 1.2*   < > 1.2*   < > <1.0* 1.1* <1.0* 1.2* 1.2*   < > = values in this interval not displayed.   No results for input(s): LIPASE, AMYLASE in the last 168 hours. No results for input(s): AMMONIA in the last 168 hours.  ABG    Component Value Date/Time   PHART 7.509 (H) 01/08/2020 1027   PCO2ART 21.6 (L) 01/08/2020 1027   PO2ART 70.0 (L) 01/08/2020 1027   HCO3 17.1 (L) 01/08/2020 1027   TCO2 18 (L) 01/08/2020 1027   ACIDBASEDEF 5.0 (H) 01/08/2020 1027   O2SAT 95.0 01/08/2020 1027     Coagulation Profile: Recent Labs  Lab 01/07/20 1707 01/09/20 0350  INR 2.0* 1.5*    Cardiac Enzymes: No results for input(s): CKTOTAL, CKMB, CKMBINDEX, TROPONINI in the last 168 hours.  HbA1C: HbA1c, POC (prediabetic range)  Date/Time Value Ref Range Status  10/18/2018 10:39 AM 6.0 5.7 - 6.4 % Final   Hgb A1c MFr Bld  Date/Time Value Ref Range Status  11/20/2019 01:00 PM 7.4 (H) 4.8 - 5.6 % Final    Comment:    (NOTE)         Prediabetes: 5.7 - 6.4         Diabetes: >6.4         Glycemic control for adults with diabetes: <7.0   06/16/2019 09:20 PM 7.0 (H) 4.8 - 5.6 % Final    Comment:    (NOTE) Pre diabetes:          5.7%-6.4% Diabetes:              >  6.4% Glycemic control for   <7.0% adults with diabetes     CBG: Recent Labs  Lab 01/09/20 1527 01/09/20 1941 01/09/20 2316 01/10/20 0327 01/10/20 0748  GLUCAP 109* 174* 190* 159* 152*    This patient is critically ill with multiple organ system failure which requires frequent high complexity decision making, assessment, support, evaluation, and titration of therapies. This was completed through the application of advanced monitoring technologies and extensive interpretation of multiple databases. During this encounter critical care time was devoted to patient care services described in this note for 40 minutes.    Audria Nine, MD  01/10/20 10:31 AM [facility]

## 2020-01-10 NOTE — Progress Notes (Signed)
Patient ID: Patricia [name], female   DOB: [birth year], 64 y.o.   MRN: [id number]  [facility] Progress Note   Assessment/ Plan:   1.  Cardiac arrest/HIE: Neuro status not improving; per neuro and CCM; grim prognosis  2. Acute hypoxic respiratory failure on vent per critical care  3. Shock - septic vs. Cardiogenic?  Off of pressors  Cultures were ordered - on table and RN is attempting to have drawn    4. ESRD: previously on HD per TTS schedule.  For now with patient unstable will continue with CRRT assess needs daily.  If CRRT clots will not restart for now   5. Anemia Ckd: Ferritin elevated from #5.  ESA qSat aranesp 200 mcg.  S/p PRBC's  6. Leukocytosis - team ordered blood cultures - they have had trouble drawing - trend per primary team   7. CKD-MBD: Enteral nutrition.  calcium and phosphorus levels at goal.  8.  COVID-19 infection: Incidental finding and s/p remdesivir. On hydrocortisone. Per CCM     Subjective:   Blood pressure has been better she has remained off of pressors.  Per nursing she is moving all extremities and has a babinksi. Started on CRRT on 3/30 AM and then started on net neg 50 an hour UF yesterday evening.  Pt has been poorly tolerant of iHD.  Patient with 2.9 liters UF over 3/30 with CRRT.   Review of systems: unable to obtain secondary to intubated     Objective:   BP (!) 160/99   Pulse 97   Temp 97.6 F (36.4 C) (Oral)   Resp 18   Ht _0  (1.626 m)   Wt 74.3 kg   SpO2 100%   BMI 28.12 kg/m   Physical Exam: General adult female examined through window given covid positive status to conserve PPE in setting of pandemic; spoke in detail with RN. A chart review of other providers notes and the patient's lab work as well as review of other pertinent studies was performed.  Exam details from prior documentation were reviewed specifically and confirmed with the bedside nurse.  Location of service: [place] adult  female examined through window critically ill; spoke in detail with RN  HEENT normocephalic atraumatic  Lungs intubated; per nursing clear breath sounds Heart regular rate off of pressors Extremities no overtly visible edema nursing reports some edema upper extremities and no lower extremity edema Neuro - per nursing not following commands but does move all extremities Access RIJ tunneled catheter per nursing   Labs: BMET Recent Labs  Lab 01/04/20 0702 01/05/20 0500 01/06/20 0533 01/06/20 1158 01/07/20 0255 01/07/20 1520 01/08/20 0434 01/08/20 1027 01/09/20 0350 01/09/20 1600 01/10/20 0430  NA 137   < > 132*   < > 133* 138 136 132* 131* 133* 134*  K 4.1   < > 5.3*   < > 5.0 5.3* 5.1 5.4* 5.0 4.3 4.0  CL 100   < > 93*  --  97* 102 102  --  96* 99 99  CO2 25   < > 23  --  24 15* 18*  --  18* 19* 21*  GLUCOSE 81   < > 287*  --  223* 222* 115*  --  214* 105* 167*  BUN 68*   < > 78*  --  46* 56* 63*  --  79* 60* 36*  CREATININE 4.38*   < > 4.14*  --  2.74* 3.28* 3.44*  --  4.31* 3.25* 2.29*  CALCIUM 7.7*   < > 8.1*  --  7.8* 7.3* 7.5*  --  7.8* 7.7* 7.6*  PHOS 2.7  --   --   --   --   --   --   --   --  5.9* 4.1   < > = values in this interval not displayed.   CBC Recent Labs  Lab 01/07/20 1520 01/07/20 2233 01/08/20 0434 01/08/20 1027 01/09/20 0350 01/09/20 1600  WBC 34.9*  --  47.8*  --  39.4* 32.9*  NEUTROABS  --   --  32.0*  --   --   --   HGB 4.7*   < > 9.0* 8.2* 6.0* 10.2*  HCT 14.8*   < > 25.9* 24.0* 18.1* 29.2*  MCV 94.9  --  89.0  --  91.4 85.4  PLT 223  --  192  --  PLATELET CLUMPS NOTED ON SMEAR, UNABLE TO ESTIMATE 94*   < > = values in this interval not displayed.      Medications:    . aspirin  81 mg Oral Daily  . chlorhexidine gluconate (MEDLINE KIT)  15 mL Mouth Rinse BID  . Chlorhexidine Gluconate Cloth  6 each Topical Daily  . Chlorhexidine Gluconate Cloth  6 each Topical Q0600  . [START ON 01/13/2020] darbepoetin (ARANESP) injection - DIALYSIS   200 mcg Intravenous Q Sat-HD  . feeding supplement (PRO-STAT SUGAR FREE 64)  30 mL Per Tube BID  . hydrocortisone sod succinate (SOLU-CORTEF) inj  50 mg Intravenous Q6H  . insulin aspart  0-24 Units Subcutaneous Q4H  . mouth rinse  15 mL Mouth Rinse 10 times per day  . multivitamin  1 tablet Per Tube QHS  . pantoprazole (PROTONIX) IV  40 mg Intravenous QHS   Claudia Desanctis , MD 01/10/2020, 6:42 AM

## 2020-01-10 NOTE — Progress Notes (Signed)
Called to update family cont to discuss poor neuro exam. Although pt is able to shrug shoulders to deep sternal rub, she still does not withdraw in any extremities, blink to threat, track or follow other commands.   Remains on crrt at this time. D/w them the prolonged vent need and goals of care moving forward with potential need for trach if considering long term life support. Daughter, Ms. Hill, states that pt would not want nursing home placement but of course wants time to discuss with family.   Code status clarified. Pt is DNR. Maintain vent and aggressive goals but should she have subsequent cardiac arrest pt is to have no cpr, shock, medications.

## 2020-01-11 LAB — CBC WITH DIFFERENTIAL/PLATELET
Abs Immature Granulocytes: 1 10*3/uL — ABNORMAL HIGH (ref 0.00–0.07)
Basophils Absolute: 0.1 10*3/uL (ref 0.0–0.1)
Basophils Relative: 0 %
Eosinophils Absolute: 0 10*3/uL (ref 0.0–0.5)
Eosinophils Relative: 0 %
HCT: 25.7 % — ABNORMAL LOW (ref 36.0–46.0)
Hemoglobin: 8.6 g/dL — ABNORMAL LOW (ref 12.0–15.0)
Immature Granulocytes: 4 %
Lymphocytes Relative: 4 %
Lymphs Abs: 0.9 10*3/uL (ref 0.7–4.0)
MCH: 30.4 pg (ref 26.0–34.0)
MCHC: 33.5 g/dL (ref 30.0–36.0)
MCV: 90.8 fL (ref 80.0–100.0)
Monocytes Absolute: 0.9 10*3/uL (ref 0.1–1.0)
Monocytes Relative: 4 %
Neutro Abs: 22.5 10*3/uL — ABNORMAL HIGH (ref 1.7–7.7)
Neutrophils Relative %: 88 %
Platelets: 90 10*3/uL — ABNORMAL LOW (ref 150–400)
RBC: 2.83 MIL/uL — ABNORMAL LOW (ref 3.87–5.11)
RDW: 17.2 % — ABNORMAL HIGH (ref 11.5–15.5)
WBC: 25.4 10*3/uL — ABNORMAL HIGH (ref 4.0–10.5)
nRBC: 3.3 % — ABNORMAL HIGH (ref 0.0–0.2)

## 2020-01-11 LAB — RENAL FUNCTION PANEL
Albumin: 1.2 g/dL — ABNORMAL LOW (ref 3.5–5.0)
Anion gap: 12 (ref 5–15)
BUN: 38 mg/dL — ABNORMAL HIGH (ref 8–23)
CO2: 22 mmol/L (ref 22–32)
Calcium: 7.7 mg/dL — ABNORMAL LOW (ref 8.9–10.3)
Chloride: 104 mmol/L (ref 98–111)
Creatinine, Ser: 2.48 mg/dL — ABNORMAL HIGH (ref 0.44–1.00)
GFR calc Af Amer: 23 mL/min — ABNORMAL LOW (ref >60)
GFR calc non Af Amer: 20 mL/min — ABNORMAL LOW (ref >60)
Glucose, Bld: 227 mg/dL — ABNORMAL HIGH (ref 70–99)
Phosphorus: 4 mg/dL (ref 2.5–4.6)
Potassium: 4.3 mmol/L (ref 3.5–5.1)
Sodium: 138 mmol/L (ref 135–145)

## 2020-01-11 LAB — MRSA PCR SCREENING: MRSA by PCR: NEGATIVE

## 2020-01-11 LAB — GLUCOSE, CAPILLARY
Glucose-Capillary: 185 mg/dL — ABNORMAL HIGH (ref 70–99)
Glucose-Capillary: 196 mg/dL — ABNORMAL HIGH (ref 70–99)
Glucose-Capillary: 296 mg/dL — ABNORMAL HIGH (ref 70–99)
Glucose-Capillary: 235 mg/dL — ABNORMAL HIGH (ref 70–99)
Glucose-Capillary: 208 mg/dL — ABNORMAL HIGH (ref 70–99)

## 2020-01-11 LAB — PROCALCITONIN: Procalcitonin: 3.23 ng/mL

## 2020-01-11 LAB — PATHOLOGIST SMEAR REVIEW

## 2020-01-11 LAB — MAGNESIUM: Magnesium: 2.2 mg/dL (ref 1.7–2.4)

## 2020-01-11 MED ORDER — SODIUM CHLORIDE 0.9 % IV SOLN
500.0000 mg | INTRAVENOUS | Status: AC
  Administered 2020-01-11 – 2020-01-13 (×3): 500 mg via INTRAVENOUS
  Filled 2020-01-11 (×3): qty 0.5

## 2020-01-11 MED ORDER — CHLORHEXIDINE GLUCONATE CLOTH 2 % EX PADS: 6.0000 | MEDICATED_PAD | Freq: Every day | CUTANEOUS | Status: DC

## 2020-01-11 MED ORDER — INSULIN ASPART 100 UNIT/ML ~~LOC~~ SOLN
0.0000 [IU] | SUBCUTANEOUS | Status: DC
  Administered 2020-01-11: 5 [IU] via SUBCUTANEOUS
  Administered 2020-01-11: 8 [IU] via SUBCUTANEOUS
  Administered 2020-01-11 – 2020-01-12 (×2): 5 [IU] via SUBCUTANEOUS
  Administered 2020-01-12 (×2): 3 [IU] via SUBCUTANEOUS
  Administered 2020-01-12: 2 [IU] via SUBCUTANEOUS
  Administered 2020-01-12: 8 [IU] via SUBCUTANEOUS
  Administered 2020-01-12: 3 [IU] via SUBCUTANEOUS
  Administered 2020-01-13: 5 [IU] via SUBCUTANEOUS
  Administered 2020-01-13: 3 [IU] via SUBCUTANEOUS
  Administered 2020-01-13: 5 [IU] via SUBCUTANEOUS
  Administered 2020-01-13: 2 [IU] via SUBCUTANEOUS
  Administered 2020-01-13: 3 [IU] via SUBCUTANEOUS
  Administered 2020-01-13 – 2020-01-14 (×5): 5 [IU] via SUBCUTANEOUS
  Administered 2020-01-14: 2 [IU] via SUBCUTANEOUS
  Administered 2020-01-14: 5 [IU] via SUBCUTANEOUS
  Administered 2020-01-15: 3 [IU] via SUBCUTANEOUS
  Administered 2020-01-15: 5 [IU] via SUBCUTANEOUS

## 2020-01-11 NOTE — Progress Notes (Signed)
Inpatient Diabetes Program Recommendations  AACE/ADA: New Consensus Statement on Inpatient Glycemic Control   Target Ranges:  Prepandial:   less than 140 mg/dL      Peak postprandial:   less than 180 mg/dL (1-2 hours)      Critically ill patients:  140 - 180 mg/dL   Results for Patricia [name], Patricia [name] (MRN [id number]) as of 01/11/2020 09:20  Ref. Range 01/10/2020 07:48 01/10/2020 11:44 01/10/2020 15:25 01/10/2020 19:39 01/10/2020 23:14 01/11/2020 03:19 01/11/2020 07:30  Glucose-Capillary Latest Ref Range: 70 - 99 mg/dL 152 (H) 249 (H) 322 (H) 271 (H) 210 (H) 185 (H) 196 (H)   Review of Glycemic Control  Diabetes history: DM2 Outpatient Diabetes medications: Lantus 6 units QHS, Novolog 2 units TID with meals Current orders for Inpatient glycemic control: Novolog 0-15 units Q4H; Solucortef 50 mg Q12H  Inpatient Diabetes Program Recommendations:    Insulin-Tube Feeding:  Please consider ordering Novolog 3 units Q4H for tube feeding coverage. If tube feeding is stopped or held then Novolog tube feeding coverage should also be stopped or held.  Thanks, Barnie Alderman, RN, MSN, CDE Diabetes Coordinator Inpatient Diabetes Program [phone] (Team Pager from 8am to 5pm)

## 2020-01-11 NOTE — Progress Notes (Signed)
[place] Progress Note Patient Name: Patricia [name] DOB: 02-05-1956 MRN: [id number]   Date of Service  01/11/2020  HPI/Events of Note  Nursing request for AM lab orders.   eICU Interventions  Will order: 1. CBC with platelets, BMP and Mg++ level at 5 AM.      Intervention Category Major Interventions: Other:  Lysle Dingwall 01/11/2020, 3:42 AM

## 2020-01-11 NOTE — Progress Notes (Signed)
Spoke with Rynesha, patient's daughter and provided full update/answered all questions.

## 2020-01-11 NOTE — Progress Notes (Signed)
Patient ID: Patricia [name], female   DOB: [birth year], 64 y.o.   MRN: [id number]  [facility] Progress Note   Assessment/ Plan:   1.  Cardiac arrest/HIE: per neuro and CCM; low chance of meaningful recovery per charting   2. Acute hypoxic respiratory failure on vent per critical care  3. Shock - multifactorial septic vs. Cardiogenic  Off of pressors  Leukocytosis improving Blood cultures in process Antibiotics per critical care   4. Anoxic encephalopathy   low chance of meaningful recovery per charting   5. ESRD: previously on HD per TTS schedule.  Started CRRT on 3/30 - stopped after clotting 3/31 PM.  No acute indication for renal replacement therapy today.  Assess dialysis needs daily.  Plan for possible intermittent HD on 4/2.   6. Anemia Ckd: Ferritin elevated from #5.  ESA qSat aranesp 200 mcg.  S/p PRBC's  7.  COVID-19 infection: Incidental finding and s/p remdesivir. On hydrocortisone. 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     Subjective:   Patient had 1.2 liters UF over 3/31 with CRRT prior to clotting then was stopped per our order.  Critical care spoke with family and should she have another arrest she is to have no CPR, shock or medications per critical care note.  Per nurse has been moving all extremities and moving head side to side.  Hasn't been on sedation continuously  Review of systems: unable to obtain secondary to intubated     Objective:   BP 138/77   Pulse (!) 102   Temp 99.4 F (37.4 C) (Axillary)   Resp 16   Ht _0  (1.626 m)   Wt 71.8 kg   SpO2 100%   BMI 27.17 kg/m   Physical Exam: General adult female in bed critically ill HEENT normocephalic atraumatic  Lungs coarse mechanical breath sounds; vent 30/5 Heart tachy low 100's on my exam Extremities trace to 1+ edema upper extremities and no lower extremity edema Neuro - not following commands but does move all extremities per nursing and some for me. No  continuous sedation Access right chest tunneled catheter   Labs: BMET Recent Labs  Lab 01/04/20 0702 01/05/20 0500 01/07/20 1520 01/07/20 1520 01/08/20 0434 01/08/20 1027 01/09/20 0350 01/09/20 1600 01/10/20 0430 01/10/20 1744 01/11/20 0359  NA 137   < > 138   < > 136 132* 131* 133* 134* 135 138  K 4.1   < > 5.3*   < > 5.1 5.4* 5.0 4.3 4.0 3.8 4.3  CL 100   < > 102  --  102  --  96* 99 99 102 104  CO2 25   < > 15*  --  18*  --  18* 19* 21* 22 22  GLUCOSE 81   < > 222*  --  115*  --  214* 105* 167* 306* 227*  BUN 68*   < > 56*  --  63*  --  79* 60* 36* 32* 38*  CREATININE 4.38*   < > 3.28*  --  3.44*  --  4.31* 3.25* 2.29* 1.86* 2.48*  CALCIUM 7.7*   < > 7.3*  --  7.5*  --  7.8* 7.7* 7.6* 7.4* 7.7*  PHOS 2.7  --   --   --   --   --   --  5.9* 4.1 3.7 4.0   < > = values in this interval not displayed.   CBC Recent Labs  Lab  01/08/20 0434 01/08/20 1027 01/09/20 0350 01/09/20 1600 01/10/20 0430 01/11/20 0359  WBC 47.8*  --  39.4* 32.9* 28.5* 25.4*  NEUTROABS 32.0*  --   --   --   --  22.5*  HGB 9.0*   < > 6.0* 10.2* 9.5* 8.6*  HCT 25.9*   < > 18.1* 29.2* 27.1* 25.7*  MCV 89.0  --  91.4 85.4 85.5 90.8  PLT 192  --  PLATELET CLUMPS NOTED ON SMEAR, UNABLE TO ESTIMATE 94* 89* 90*   < > = values in this interval not displayed.      Medications:    . aspirin  81 mg Oral Daily  . carvedilol  6.25 mg Oral BID WC  . chlorhexidine gluconate (MEDLINE KIT)  15 mL Mouth Rinse BID  . Chlorhexidine Gluconate Cloth  6 each Topical Daily  . Chlorhexidine Gluconate Cloth  6 each Topical Q0600  . [START ON 01/13/2020] darbepoetin (ARANESP) injection - DIALYSIS  200 mcg Intravenous Q Sat-HD  . feeding supplement (PRO-STAT SUGAR FREE 64)  30 mL Per Tube BID  . hydrocortisone sod succinate (SOLU-CORTEF) inj  50 mg Intravenous Q12H  . insulin aspart  0-9 Units Subcutaneous Q4H  . mouth rinse  15 mL Mouth Rinse 10 times per day  . multivitamin  1 tablet Per Tube QHS  . pantoprazole  (PROTONIX) IV  40 mg Intravenous QHS  . vancomycin variable dose per unstable renal function (pharmacist dosing)   Does not apply See admin instructions   Claudia Desanctis , MD 01/11/2020, 6:44 AM

## 2020-01-11 NOTE — Progress Notes (Signed)
NAME:  Patricia [name], MRN:  [id number], DOB:  20-Dec-1955, LOS: 12 ADMISSION DATE:  01/04/2020, CONSULTATION DATE:  01/11/20 REFERRING MD:  Regenia Skeeter CHIEF COMPLAINT:  Cardiac Arrest   Brief History   64 y.o. F with PMH ESRD, anemia, HFrEF, Depression, Type 2 DM, HTN and obesity who presented in cardiac arrest.  Pt was in her usual state of health and went to dialysis 3/20 where she became nauseated and vomited, at home she had a syncopal episode and collapse. CPR initiated and ROSC after 15 mins.    History of present illness   Patricia [name] is a 64 y.o. F with PMH of  ESRD, anemia, HFrEF, Depression, Type 2 DM, HTN and obesity who presented in cardiac arrest. She lives with family and they state she has been in her usual state of health and went to dialysis today where she had an episode of nausea and vomiting and returned home.  There she became sweaty and collapsed, family started CPR immediately and called EMS who performed 15 minutes of CPR with ROSC, no mention of shockable rhythm.    In the ED, pt was not initially responsive and was intubated.  CT head was without acute findings, patient was hypertensive and febrile with a potassium of 2.8 and prolonged QTC.  No sign of STEMI on EKG.  Lactic acid was elevated and chest x-ray showed vascular congestion.  She was given 2 g of magnesium, broad-spectrum antibiotics and IV fluids.  Covid-19 resulted positive.  PCCM consulted for admission.  Family states that she has showed no symptoms of Covid, and they are unsure how she may have contracted this.  Past Medical History   has a past medical history of Allergy, Anemia, Anxiety, Blood transfusion without reported diagnosis, Cardiomyopathy ([place]) (11/2016), CHF (congestive heart failure) ([place]), CKD (chronic kidney disease), stage IV ([place]), Depression, Diabetes mellitus without complication ([place]), Diverticulitis, Dyspnea, Hypertension, and Obesity.   Significant Hospital Events   3/20 admit to  [facility] 3/21 TTM initiated to 33 3/22 completed TTM, rewarmed 3/27: hypertensive prior to HD.  Started hydral, had been on labetelol gtt.  3/28 AM briefly on levophed for hypotension. Became acutely hypotensive in early afternoon, followed by refractory hypotension then cardiac arrest (PEA per nurse).   PEA --> vfib. Defibrillated back to sinus.   Consults:  3/21 Nephrology 3/27 neuro  Procedures:  3/20 ET tube 3/20 CVC > 3/24 11/23/19: R IJ tunneled perm cath:   Significant Diagnostic Tests:  3/20 CT head>> no acute findings 3/21 EEG>> generalized background slowing, no epileptiform activity 3/21 echocardiogram-LVEF 30 to 35%, no regional wall motion abnormalities, moderate LVH.  Grade 1 diastolic dysfunction.  Mildly dilated LA, normal RV. 3/22 EEG-generalized background attenuation with generalized slowing c/w profound diffuse encephalopathy  3/26: MRI IMPRESSION: Small area of acute infarct in the right occipital cortex. This is more likely to be an embolus than anoxic brain injury.  Mild chronic microvascular ischemic changes in the white matter. Scattered areas of microhemorrhage in the brain correlate with hypertension history. 3/29 carotid u/s: Right Carotid: Velocities in the right ICA are consistent with a 1-39%  stenosis.         Atypical and low velocity waveforms visualized.   Left Carotid: Velocities in the left ICA are consistent with a 1-39%  stenosis.        Atypical and low velocity waveforms visualized.   Vertebrals: Bilateral vertebral arteries demonstrate antegrade flow.  Subclavians: Normal flow hemodynamics were seen in bilateral subclavian  arteries.    Micro Data:  3/20 SARS-Cov-2>> positive 3/22 sputum- GNRs> enterobacter cloacae (R cefazolin, otherwise sensitive) 3/20 blood: neg  Antimicrobials:  Cefepime 3/20 Flagyl 3/20  Vancomycin 3/20-3/22, 3/28 Zosyn 3/24>>3/28 merrem 3/28->  Interim history/subjective:   4/1: no events overnight. bp better controlled. Less prn use with start of coreg. Remains unresponsive. This am without response to sternal rub.  3/31: no reported events overnight. Tolerated crrt. Remains hypertensive. Still poorly/minimally responsive. Does have faint shoulder shrug with deep sternal rub otherwise no change.  3/30: no acute events reported overnight. BP up and off pressors this am. Spontaneously opening eyes but no tracking/withdrawl or following of commands.  3/29: Cardiac arrest following hypotension 3/28  Objective   Blood pressure (!) 121/92, pulse (!) 103, temperature 100 F (37.8 C), temperature source Axillary, resp. rate 18, height 5\' 4"  (1.626 m), weight 71.8 kg, SpO2 100 %.    Vent Mode: PRVC FiO2 (%):  [30 %] 30 % Set Rate:  [18 bmp] 18 bmp Vt Set:  [430 mL] 430 mL PEEP:  [5 cmH20] 5 cmH20 Plateau Pressure:  [12 cmH20-18 cmH20] 16 cmH20   Intake/Output Summary (Last 24 hours) at 01/11/2020 1003 Last data filed at 01/11/2020 0900 Gross per 24 hour  Intake 1175.1 ml  Output 1038 ml  Net 137.1 ml   Filed Weights   01/09/20 0500 01/10/20 0500 01/11/20 0500  Weight: 74.4 kg 74.3 kg 71.8 kg    General: critically ill appearing woman laying in bed in NAD, eyes open intermittently NOT tracking or blinking to threat HEENT: [place]/AT, eyes anicteric, oral mucosa moist. OGT & ETT. Cardio: Regular rate and rhythm, no murmurs Respiratory: CTAB.  Abdomen: Soft, nontender, nondistended Extremities: No clubbing, cyanosis, or edema. Derm: No rashes or wounds. Neuro: eyes open, not purposeful eye opening, not tracking, Pupils reactive to light  Not withdrawing to pain in any extremity  No response to sternal rub,. Not following commands. Does not blink to threat    Resolved Hospital Problem list     Assessment & Plan:  Initial OOHCardiac arrest, PEA/vfib: ROSC in 8min Repeated cardiac arrest 3/28 -Episode of hypotension 3/28, followed by cardiac arrest.  -Unclear  cause.  -? sepsis given elevated WBC (downward trend) -Afebrile and pct downtrending 21->8->4->3 -Initial cardiac arrest prior to admission, possibly due to electrolyte disturbance, prolonged QTC.  Given event episode, I wonder about labile blood pressure and or hypotension after HD as outpatient could have caused her out of hospital arrest.   -Stress dose steroids stopping today.  -Cont broad spectrum antibiotics.  QTC prolongation  PTA on phenergan. -Qtc remained prolonged at last ekg 3/25 -Per cardiology note she has a history of prolonged QT interval, usually 480-510 ms -Last stress test in 2018 did not show evidence of ischemia she does not have a history of ventricular arrhythmia P: -Continue avoiding all QTC prolonging medications HFrEF  Acutely worsened systolic function post-arrest. Bradycardia resolved post- rewarming. -Echocardiogram 1 year ago with EF of 40 to 45% -She has recently been taken off all home antihypertensive medication except hydralazine due to borderline low blood pressure -Follows with Dr. Sallyanne Kuster H/o HTN with recent shock (multifactorial, cardiogenic/septic):  BP control improved  -stopping stress steroids today -resume home coreg but decreased dose, cont prn hydral and labetalol for now -no prn hydral used and 2x labetalol since coreg started yesterday evening. Will follow and increase as needed to avoid prn use HLD Lipitor  Transaminitis: -Shock liver.  -improved -INR elevated but  improved   DM2 with Hypoglycemia now hyperglycemic:  -change insulin coverage to appropriate scale.    Anoxic encephalopathy -Completed TTM to 33 degrees after initial out of hospital arrest.  -Continue to avoid all sedation.  Fentanyl as needed ordered if evidence of pain -on review by neurology and radiology: anoxic injury seen on MRI Prognosis poor and do not expect significant return of function.   -Cont discussions with family. Spoke with Ms. Hill today re:  potential trach vs comfort care. She is appreciative of the information and will discuss with her sister and support system. She indicated that her mother would not want (and had previously expressed she would not go to) nursing home placement.  - Palliative care consulted and appreciated  Acute Stroke:  Small area of acute infarct in the right occipital cortex.  Embolic?  No evidence of clot on Echo.  Consider TEE when more stable.   Neuro consult appreciated: low chance of meaningful recovery  -Carotid doppler without stenosis -asa ok to continue -hold heparin in light of plts  Acute respiratory failure requiring mechanical ventilation post cardiac arrest. LLL pneumonia due to Enterobacter likely from aspiration during cardiac arrest. Tolerating minimal vent support.  -Continue daily SBT.  Continue to hold sedation. (mental status is precluding extubation at this time) -VAP prevention protocol -on BS antibiotics now for possible sepsis.  Covid-19 infection -Patient with low-grade fever on arrival, no URI symptoms per family  P: -completed 10 days of steroids for covid... Broadened to stress dose steroids for shock. (weaning as of 3/31)  -Previously completed remdesivir. -On heparin 7500 TID for ppx - cont to hold today given plts  Acute Anemia:  hgb with inappropriately elevated response from 2U prbc 6->10 Still no overt bleeding noted.  Hold heparin Thrombocytopenia:  -plts stable today.  -holding heparin restart until >100K  F/E/N: P: -tolerating tube feeds -Continue to monitor electrolytes   ESRD -T/TH/sat schedule -Per family it sounds like she completed most of her dialysis 3/20 P: -Appreciate nephrology's assistance. -crrt clotted last evening.  -for ihd tomorrow 4/2     Best practice:  Diet: TF Pain/Anxiety/Delirium protocol (if indicated): avoid sedation meds VAP protocol (if indicated): per protocol DVT prophy: holding heparin in light of acute  anemia GI prophylaxis: Protonix Glucose control: SSI Mobility: Bedrest Code Status: DNR Family Communication:Ms. Hill via phone daily Disposition: ICU  Labs   CBC: Recent Labs  Lab 01/08/20 0434 01/08/20 0434 01/08/20 1027 01/09/20 0350 01/09/20 1600 01/10/20 0430 01/11/20 0359  WBC 47.8*  --   --  39.4* 32.9* 28.5* 25.4*  NEUTROABS 32.0*  --   --   --   --   --  22.5*  HGB 9.0*   < > 8.2* 6.0* 10.2* 9.5* 8.6*  HCT 25.9*   < > 24.0* 18.1* 29.2* 27.1* 25.7*  MCV 89.0  --   --  91.4 85.4 85.5 90.8  PLT 192  --   --  PLATELET CLUMPS NOTED ON SMEAR, UNABLE TO ESTIMATE 94* 89* 90*   < > = values in this interval not displayed.    Basic Metabolic Panel: Recent Labs  Lab 01/09/20 0350 01/09/20 1600 01/10/20 0430 01/10/20 1744 01/11/20 0359  NA 131* 133* 134* 135 138  K 5.0 4.3 4.0 3.8 4.3  CL 96* 99 99 102 104  CO2 18* 19* 21* 22 22  GLUCOSE 214* 105* 167* 306* 227*  BUN 79* 60* 36* 32* 38*  CREATININE 4.31* 3.25* 2.29* 1.86* 2.48*  CALCIUM 7.8* 7.7* 7.6* 7.4* 7.7*  MG  --   --  2.1  --  2.2  PHOS  --  5.9* 4.1 3.7 4.0   GFR: Estimated Creatinine Clearance: 23.1 mL/min (A) (by C-G formula based on SCr of 2.48 mg/dL (H)). Recent Labs  Lab 01/09/20 0350 01/09/20 0957 01/09/20 1600 01/10/20 0430 01/11/20 0359  PROCALCITON  --  8.02  --  4.24 3.23  WBC 39.4*  --  32.9* 28.5* 25.4*    Liver Function Tests: Recent Labs  Lab 01/06/20 0533 01/06/20 0533 01/07/20 0255 01/07/20 0255 01/07/20 1520 01/07/20 1520 01/08/20 0434 01/08/20 0434 01/09/20 0350 01/09/20 1600 01/10/20 0430 01/10/20 1744 01/11/20 0359  AST 30  --  28  --  1,652*  --  2,683*  --  537*  --   --   --   --   ALT 21  --  20  --  1,089*  --  1,504*  --  820*  --   --   --   --   ALKPHOS 81  --  80  --  71  --  73  --  81  --   --   --   --   BILITOT 0.6  --  0.6  --  0.7  --  1.1  --  0.7  --   --   --   --   PROT 4.7*  --  4.7*  --  3.6*  --  3.9*  --  3.6*  --   --   --   --   ALBUMIN  1.2*   < > 1.2*   < > <1.0*   < > 1.1*   < > <1.0* 1.2* 1.2* 1.2* 1.2*   < > = values in this interval not displayed.   No results for input(s): LIPASE, AMYLASE in the last 168 hours. No results for input(s): AMMONIA in the last 168 hours.  ABG    Component Value Date/Time   PHART 7.509 (H) 01/08/2020 1027   PCO2ART 21.6 (L) 01/08/2020 1027   PO2ART 70.0 (L) 01/08/2020 1027   HCO3 17.1 (L) 01/08/2020 1027   TCO2 18 (L) 01/08/2020 1027   ACIDBASEDEF 5.0 (H) 01/08/2020 1027   O2SAT 95.0 01/08/2020 1027     Coagulation Profile: Recent Labs  Lab 01/07/20 1707 01/09/20 0350  INR 2.0* 1.5*    Cardiac Enzymes: No results for input(s): CKTOTAL, CKMB, CKMBINDEX, TROPONINI in the last 168 hours.  HbA1C: HbA1c, POC (prediabetic range)  Date/Time Value Ref Range Status  10/18/2018 10:39 AM 6.0 5.7 - 6.4 % Final   Hgb A1c MFr Bld  Date/Time Value Ref Range Status  11/20/2019 01:00 PM 7.4 (H) 4.8 - 5.6 % Final    Comment:    (NOTE)         Prediabetes: 5.7 - 6.4         Diabetes: >6.4         Glycemic control for adults with diabetes: <7.0   06/16/2019 09:20 PM 7.0 (H) 4.8 - 5.6 % Final    Comment:    (NOTE) Pre diabetes:          5.7%-6.4% Diabetes:              >6.4% Glycemic control for   <7.0% adults with diabetes     CBG: Recent Labs  Lab 01/10/20 1525 01/10/20 1939 01/10/20 2314 01/11/20 0319 01/11/20 0730  GLUCAP 322* 271* 210* 185* 196*  This patient is critically ill with multiple organ system failure which requires frequent high complexity decision making, assessment, support, evaluation, and titration of therapies. This was completed through the application of advanced monitoring technologies and extensive interpretation of multiple databases. During this encounter critical care time was devoted to patient care services described in this note for 36 minutes.    Audria Nine, MD 01/11/20 10:03 AM [facility]

## 2020-01-11 NOTE — Progress Notes (Signed)
Pharmacy Antibiotic Note  Patricia [name] is a 64 y.o. female admitted on 12/29/2019 with sepsis.  Pharmacy has been consulted for meropenem and vancomycin dosing. Day #5 since antibiotics restarted for worsening sepsis. Since restart, WBC and ANC are slowly trending down. PCT is trending down.   Patient was on CRRT but it clotted at ~1815PM on 3/31 with no plans to resume at this moment.  Last dose vancomycin at 12:30 PM and meropenem at 1430 PM on 3/31. Will have sufficient levels for at least 24 hrs. Both scheduled doses were discontinued.  Plan for HD on 4/2. Anuric.   Plan: Pre-HD Vancomycin random level in AM to assess need for dose after HD tomorrow.  Will plan for Vancomycin 750 mg post-HD pending level results.  Merrem 500mg  IV every 24 hours for now - next dose at 1430 PM today.  Monitor HD plans going forward and need to adjust further.     Height: 5\' 4"  (162.6 cm) Weight: 158 lb 4.6 oz (71.8 kg) IBW/kg (Calculated) : 54.7  Temp (24hrs), Avg:97 F (36.1 C), Min:94.1 F (34.5 C), Max:100 F (37.8 C)  Recent Labs  Lab 01/08/20 0434 01/08/20 0434 01/09/20 0350 01/09/20 1600 01/10/20 0430 01/10/20 1744 01/11/20 0359  WBC 47.8*  --  39.4* 32.9* 28.5*  --  25.4*  CREATININE 3.44*   < > 4.31* 3.25* 2.29* 1.86* 2.48*   < > = values in this interval not displayed.    Estimated Creatinine Clearance: 23.1 mL/min (A) (by C-G formula based on SCr of 2.48 mg/dL (H)).  - Not accurate. Anuric. Off CRRT. HD tomorrow.   Allergies  Allergen Reactions  . Codone [Hydrocodone] Itching  . Norvasc [Amlodipine Besylate] Swelling    Lower extremity  . Dextromethorphan-Guaifenesin Nausea And Vomiting  . Hydralazine Swelling and Other (See Comments)    Leg swelling. Pt takes this as o/p. Patient can take PO now  . Sulfa Antibiotics Itching and Rash    Vanc 3/20>>3/22, restarted 3/28 >> Merrem 3/28 >> Cefepime 3/20>>3/22 Zosyn 3/22>> Flagyl x 1 3/20  Remdesivir 3/21 >>  3/25  3/20 COVID - positive 3/20 BCx - negative 3/21 MRSA - negative 3/23 RA - Enterobacter (S Fortaz, Cipro, Primaxin, Septra, Zosyn) 3/28 BCx - ?canceled 3/31 BCx >>sent  Sloan Leiter, PharmD, BCPS, BCCCP Clinical Pharmacist Please check AMION for all [place] phone numbers 01/11/2020, 7:59 AM

## 2020-01-11 DEATH — deceased

## 2020-01-12 DIAGNOSIS — Z66 Do not resuscitate: Secondary | ICD-10-CM

## 2020-01-12 LAB — CBC
HCT: 25.4 % — ABNORMAL LOW (ref 36.0–46.0)
Hemoglobin: 8.2 g/dL — ABNORMAL LOW (ref 12.0–15.0)
MCH: 30.6 pg (ref 26.0–34.0)
MCHC: 32.3 g/dL (ref 30.0–36.0)
MCV: 94.8 fL (ref 80.0–100.0)
Platelets: 85 10*3/uL — ABNORMAL LOW (ref 150–400)
RBC: 2.68 MIL/uL — ABNORMAL LOW (ref 3.87–5.11)
RDW: 17.5 % — ABNORMAL HIGH (ref 11.5–15.5)
WBC: 20.9 10*3/uL — ABNORMAL HIGH (ref 4.0–10.5)
nRBC: 0.8 % — ABNORMAL HIGH (ref 0.0–0.2)

## 2020-01-12 LAB — GLUCOSE, CAPILLARY
Glucose-Capillary: 283 mg/dL — ABNORMAL HIGH (ref 70–99)
Glucose-Capillary: 145 mg/dL — ABNORMAL HIGH (ref 70–99)
Glucose-Capillary: 155 mg/dL — ABNORMAL HIGH (ref 70–99)
Glucose-Capillary: 171 mg/dL — ABNORMAL HIGH (ref 70–99)
Glucose-Capillary: 188 mg/dL — ABNORMAL HIGH (ref 70–99)
Glucose-Capillary: 212 mg/dL — ABNORMAL HIGH (ref 70–99)

## 2020-01-12 LAB — RENAL FUNCTION PANEL
Albumin: 1.2 g/dL — ABNORMAL LOW (ref 3.5–5.0)
Anion gap: 13 (ref 5–15)
BUN: 61 mg/dL — ABNORMAL HIGH (ref 8–23)
CO2: 23 mmol/L (ref 22–32)
Calcium: 7.8 mg/dL — ABNORMAL LOW (ref 8.9–10.3)
Chloride: 104 mmol/L (ref 98–111)
Creatinine, Ser: 3.63 mg/dL — ABNORMAL HIGH (ref 0.44–1.00)
GFR calc Af Amer: 15 mL/min — ABNORMAL LOW (ref >60)
GFR calc non Af Amer: 13 mL/min — ABNORMAL LOW (ref >60)
Glucose, Bld: 173 mg/dL — ABNORMAL HIGH (ref 70–99)
Phosphorus: 4.6 mg/dL (ref 2.5–4.6)
Potassium: 3.4 mmol/L — ABNORMAL LOW (ref 3.5–5.1)
Sodium: 140 mmol/L (ref 135–145)

## 2020-01-12 LAB — MAGNESIUM: Magnesium: 2.4 mg/dL (ref 1.7–2.4)

## 2020-01-12 MED ORDER — WHITE PETROLATUM EX OINT
TOPICAL_OINTMENT | Freq: Two times a day (BID) | CUTANEOUS | Status: DC
  Administered 2020-01-12 – 2020-01-13 (×4): 0.2 via TOPICAL
  Administered 2020-01-14: 1 via TOPICAL
  Administered 2020-01-15 – 2020-01-27 (×12): 0.2 via TOPICAL
  Filled 2020-01-12 (×10): qty 28.35

## 2020-01-12 MED ORDER — HEPARIN SODIUM (PORCINE) 5000 UNIT/ML IJ SOLN
5000.0000 [IU] | Freq: Three times a day (TID) | INTRAMUSCULAR | Status: DC
  Administered 2020-01-12 – 2020-01-15 (×10): 5000 [IU] via SUBCUTANEOUS
  Filled 2020-01-12 (×10): qty 1

## 2020-01-12 MED ORDER — PANTOPRAZOLE SODIUM 40 MG PO PACK
40.0000 mg | PACK | Freq: Every day | ORAL | Status: DC
  Administered 2020-01-12 – 2020-01-14 (×3): 40 mg
  Filled 2020-01-12 (×5): qty 20

## 2020-01-12 NOTE — Consult Note (Addendum)
[facility] Nurse Consult Note: Reason for Consult: Consult requested for sacrum and lip.  Pt is in isolation for Covid; performed remotely after review of the progress notes and the nursing flowsheet.  Wound type: Lip has developed a medical device related deep tissue pressure injury related to the ETT.  Sacrum has a stage 2 pressure injury.   Pressure Injury POA: No Dressing procedure/placement/frequency: Pt is critically ill with multiple systemic factors which can impair healing; family is considering possible palliative care goals. Topical treatment orders provided for bedside nurses to protect from further injury and promote healing as follows: Vaseline to lip BID, foam dressing to sacrum, change Q 3 days or PRN soiling. [place] team will reassess lip pressure injury weekly and determine if a change in the plan of care should be implemented at that time. Julien Girt MSN, RN, [place], [place], [place]

## 2020-01-12 NOTE — Progress Notes (Addendum)
NAME:  Patricia [name], MRN:  [id number], DOB:  27-Feb-1956, LOS: 44 ADMISSION DATE:  01/06/2020, CONSULTATION DATE:  01/12/20 REFERRING MD:  Regenia Skeeter CHIEF COMPLAINT:  Cardiac Arrest   Brief History   64 y.o. F with PMH ESRD, anemia, HFrEF, Depression, Type 2 DM, HTN and obesity who presented in cardiac arrest.  Pt was in her usual state of health and went to dialysis 3/20 where she became nauseated and vomited, at home she had a syncopal episode and collapse. CPR initiated and ROSC after 15 mins.    History of present illness   Patricia [name] is a 64 y.o. F with PMH of  ESRD, anemia, HFrEF, Depression, Type 2 DM, HTN and obesity who presented in cardiac arrest. She lives with family and they state she has been in her usual state of health and went to dialysis today where she had an episode of nausea and vomiting and returned home.  There she became sweaty and collapsed, family started CPR immediately and called EMS who performed 15 minutes of CPR with ROSC, no mention of shockable rhythm.    In the ED, pt was not initially responsive and was intubated.  CT head was without acute findings, patient was hypertensive and febrile with a potassium of 2.8 and prolonged QTC.  No sign of STEMI on EKG.  Lactic acid was elevated and chest x-ray showed vascular congestion.  She was given 2 g of magnesium, broad-spectrum antibiotics and IV fluids.  Covid-19 resulted positive.  PCCM consulted for admission.  Family states that she has showed no symptoms of Covid, and they are unsure how she may have contracted this.  Past Medical History   has a past medical history of Allergy, Anemia, Anxiety, Blood transfusion without reported diagnosis, Cardiomyopathy ([place]) (11/2016), CHF (congestive heart failure) ([place]), CKD (chronic kidney disease), stage IV ([place]), Depression, Diabetes mellitus without complication ([place]), Diverticulitis, Dyspnea, Hypertension, and Obesity.   Significant Hospital Events   3/20 admit to  [facility] 3/21 TTM initiated to 33 3/22 completed TTM, rewarmed 3/27: hypertensive prior to HD.  Started hydral, had been on labetelol gtt.  3/28 AM briefly on levophed for hypotension. Became acutely hypotensive in early afternoon, followed by refractory hypotension then cardiac arrest (PEA per nurse).   PEA --> vfib. Defibrillated back to sinus.   Consults:  3/21 Nephrology 3/27 neuro  Procedures:  3/20 ET tube 3/20 CVC > 3/24 11/23/19: R IJ tunneled perm cath:   Significant Diagnostic Tests:  3/20 CT head>> no acute findings 3/21 EEG>> generalized background slowing, no epileptiform activity 3/21 echocardiogram-LVEF 30 to 35%, no regional wall motion abnormalities, moderate LVH.  Grade 1 diastolic dysfunction.  Mildly dilated LA, normal RV. 3/22 EEG-generalized background attenuation with generalized slowing c/w profound diffuse encephalopathy  3/26: MRI IMPRESSION: Small area of acute infarct in the right occipital cortex. This is more likely to be an embolus than anoxic brain injury.  Mild chronic microvascular ischemic changes in the white matter. Scattered areas of microhemorrhage in the brain correlate with hypertension history. 3/29 carotid u/s: Right Carotid: Velocities in the right ICA are consistent with a 1-39%  stenosis.         Atypical and low velocity waveforms visualized.   Left Carotid: Velocities in the left ICA are consistent with a 1-39%  stenosis.        Atypical and low velocity waveforms visualized.   Vertebrals: Bilateral vertebral arteries demonstrate antegrade flow.  Subclavians: Normal flow hemodynamics were seen in bilateral subclavian  arteries.    Micro Data:  3/20 SARS-Cov-2>> positive 3/22 sputum- GNRs> enterobacter cloacae (R cefazolin, otherwise sensitive) 3/20 blood: neg  Antimicrobials:  Cefepime 3/20 Flagyl 3/20  Vancomycin 3/20-3/22, 3/28 Zosyn 3/24>>3/28 merrem 3/28->  Interim history/subjective:   4/1: no events overnight. bp better controlled. Less prn use with start of coreg. Remains unresponsive. This am without response to sternal rub.  3/31: no reported events overnight. Tolerated crrt. Remains hypertensive. Still poorly/minimally responsive. Does have faint shoulder shrug with deep sternal rub otherwise no change.  3/30: no acute events reported overnight. BP up and off pressors this am. Spontaneously opening eyes but no tracking/withdrawl or following of commands.  3/29: Cardiac arrest following hypotension 3/28  Objective   Blood pressure 131/69, pulse 93, temperature 98.7 F (37.1 C), temperature source Axillary, resp. rate (!) 21, height 5\' 4"  (1.626 m), weight 77.5 kg, SpO2 100 %.    Vent Mode: PRVC FiO2 (%):  [30 %] 30 % Set Rate:  [18 bmp] 18 bmp Vt Set:  [430 mL] 430 mL PEEP:  [5 cmH20] 5 cmH20 Pressure Support:  [8 cmH20] 8 cmH20 Plateau Pressure:  [15 cmH20-18 cmH20] 15 cmH20   Intake/Output Summary (Last 24 hours) at 01/12/2020 0910 Last data filed at 01/12/2020 0800 Gross per 24 hour  Intake 760.07 ml  Output 300 ml  Net 460.07 ml   Filed Weights   01/11/20 0500 01/12/20 0459 01/12/20 0656  Weight: 71.8 kg 71.8 kg 77.5 kg   GEN: ill appearing woman lying in bed HEENT: trach in place moderate thick secretions CV: RRR, ext warm PULM: Scattered rhonci, no accessory muscle use, triggering vent GI: soft, +BS EXT: trace edema NEURO:  Makes a chewing motion with mouth Roving gaze No response to pain Upgoing babinski Triggering vent +cough/gag PSYCH: cannot assess SKIN: no rashes, has skin tear and sacral skin breakdown  Pressure Injury 01/09/20 Lip Right;Upper Deep Tissue Pressure Injury - Purple or maroon localized area of discolored intact skin or blood-filled blister due to damage of underlying soft tissue from pressure and/or shear. (Active)  01/09/20 2000  Location: Lip  Location Orientation: Right;Upper  Staging: Deep Tissue Pressure Injury -  Purple or maroon localized area of discolored intact skin or blood-filled blister due to damage of underlying soft tissue from pressure and/or shear.  Wound Description (Comments):   Present on Admission: No     Pressure Injury 01/10/20 Sacrum Lower Stage 2 -  Partial thickness loss of dermis presenting as a shallow open injury with a red, pink wound bed without slough. (Active)  01/10/20 2000  Location: Sacrum  Location Orientation: Lower  Staging: Stage 2 -  Partial thickness loss of dermis presenting as a shallow open injury with a red, pink wound bed without slough.  Wound Description (Comments):   Present on Admission: No       Resolved Hospital Problem list     Assessment & Plan:  Initial OOHCardiac arrest, PEA/vfib: ROSC in 63min Repeated cardiac arrest 3/28 PEA - No clear cause identified, differential is torsades due to longstanding QTc or advanced vasoplegia with preload dependence worsened by HD; regardless, HD stable now, nothing reversible found on labs or imaging  - Maintain normal electrolytes and BPs  Anoxic brain injury and metabolic encephalopathy- EEG/MRI unrevealing.  Neurology has evaluated and do not see a meaningful chance of recovery.  Palliative involved. - Will reach out to family to discuss a timeline - Continue to hold all sedation  Leukocytosis- fine to finish course  of meropenem, not sure what we are treating, likely post arrest demargination  Acute anemia- unclear source, Hgb stabilized now, retrial of heparin, monitor for s/s of bleeding  Stable mild thrombocytopenia post arrest- monitor  ESRD on HD- iHD per nephrology   Best practice:  Diet: TF Pain/Anxiety/Delirium protocol (if indicated): avoid sedation  VAP protocol (if indicated): per protocol DVT prophy: heparin GI prophylaxis: Protonix Glucose control: SSI Mobility: Bedrest Code Status: DNR Family Communication: will call Disposition: ICU  Labs   CBC: Recent Labs  Lab  01/08/20 0434 01/08/20 1027 01/09/20 0350 01/09/20 1600 01/10/20 0430 01/11/20 0359 01/12/20 0449  WBC 47.8*  --  39.4* 32.9* 28.5* 25.4* 20.9*  NEUTROABS 32.0*  --   --   --   --  22.5*  --   HGB 9.0*   < > 6.0* 10.2* 9.5* 8.6* 8.2*  HCT 25.9*   < > 18.1* 29.2* 27.1* 25.7* 25.4*  MCV 89.0  --  91.4 85.4 85.5 90.8 94.8  PLT 192  --  PLATELET CLUMPS NOTED ON SMEAR, UNABLE TO ESTIMATE 94* 89* 90* 85*   < > = values in this interval not displayed.    Basic Metabolic Panel: Recent Labs  Lab 01/09/20 1600 01/10/20 0430 01/10/20 1744 01/11/20 0359 01/12/20 0449  NA 133* 134* 135 138 140  K 4.3 4.0 3.8 4.3 3.4*  CL 99 99 102 104 104  CO2 19* 21* 22 22 23   GLUCOSE 105* 167* 306* 227* 173*  BUN 60* 36* 32* 38* 61*  CREATININE 3.25* 2.29* 1.86* 2.48* 3.63*  CALCIUM 7.7* 7.6* 7.4* 7.7* 7.8*  MG  --  2.1  --  2.2 2.4  PHOS 5.9* 4.1 3.7 4.0 4.6   GFR: Estimated Creatinine Clearance: 16.4 mL/min (A) (by C-G formula based on SCr of 3.63 mg/dL (H)). Recent Labs  Lab 01/09/20 0350 01/09/20 0957 01/09/20 1600 01/10/20 0430 01/11/20 0359 01/12/20 0449  PROCALCITON  --  8.02  --  4.24 3.23  --   WBC   < >  --  32.9* 28.5* 25.4* 20.9*   < > = values in this interval not displayed.    Liver Function Tests: Recent Labs  Lab 01/06/20 0533 01/06/20 0533 01/07/20 0255 01/07/20 0255 01/07/20 1520 01/07/20 1520 01/08/20 0434 01/08/20 0434 01/09/20 0350 01/09/20 0350 01/09/20 1600 01/10/20 0430 01/10/20 1744 01/11/20 0359 01/12/20 0449  AST 30  --  28  --  1,652*  --  2,683*  --  537*  --   --   --   --   --   --   ALT 21  --  20  --  1,089*  --  1,504*  --  820*  --   --   --   --   --   --   ALKPHOS 81  --  80  --  71  --  73  --  81  --   --   --   --   --   --   BILITOT 0.6  --  0.6  --  0.7  --  1.1  --  0.7  --   --   --   --   --   --   PROT 4.7*  --  4.7*  --  3.6*  --  3.9*  --  3.6*  --   --   --   --   --   --   ALBUMIN 1.2*   < > 1.2*   < > <  1.0*   < > 1.1*    < > <1.0*   < > 1.2* 1.2* 1.2* 1.2* 1.2*   < > = values in this interval not displayed.   No results for input(s): LIPASE, AMYLASE in the last 168 hours. No results for input(s): AMMONIA in the last 168 hours.  ABG    Component Value Date/Time   PHART 7.509 (H) 01/08/2020 1027   PCO2ART 21.6 (L) 01/08/2020 1027   PO2ART 70.0 (L) 01/08/2020 1027   HCO3 17.1 (L) 01/08/2020 1027   TCO2 18 (L) 01/08/2020 1027   ACIDBASEDEF 5.0 (H) 01/08/2020 1027   O2SAT 95.0 01/08/2020 1027     Coagulation Profile: Recent Labs  Lab 01/07/20 1707 01/09/20 0350  INR 2.0* 1.5*    Cardiac Enzymes: No results for input(s): CKTOTAL, CKMB, CKMBINDEX, TROPONINI in the last 168 hours.  HbA1C: HbA1c, POC (prediabetic range)  Date/Time Value Ref Range Status  10/18/2018 10:39 AM 6.0 5.7 - 6.4 % Final   Hgb A1c MFr Bld  Date/Time Value Ref Range Status  11/20/2019 01:00 PM 7.4 (H) 4.8 - 5.6 % Final    Comment:    (NOTE)         Prediabetes: 5.7 - 6.4         Diabetes: >6.4         Glycemic control for adults with diabetes: <7.0   06/16/2019 09:20 PM 7.0 (H) 4.8 - 5.6 % Final    Comment:    (NOTE) Pre diabetes:          5.7%-6.4% Diabetes:              >6.4% Glycemic control for   <7.0% adults with diabetes     CBG: Recent Labs  Lab 01/11/20 1524 01/11/20 1935 01/12/20 0105 01/12/20 0451 01/12/20 0741  GLUCAP 235* 208* 283* 145* 155*    This patient is critically ill with multiple organ system failure which requires frequent high complexity decision making, assessment, support, evaluation, and titration of therapies. This was completed through the application of advanced monitoring technologies and extensive interpretation of multiple databases. During this encounter critical care time was devoted to patient care services described in this note for 32 minutes.    Candee Furbish, MD 01/12/20 9:10 AM [facility]

## 2020-01-12 NOTE — Progress Notes (Signed)
Patient ID: Patricia [name], female   DOB: [birth year], 64 y.o.   MRN: [id number]  Patricia [name] Progress Note   Assessment/ Plan:   1.  Cardiac arrest: per neuro and CCM  2. Acute hypoxic respiratory failure on vent per critical care  3. Sepsis - multifactorial septic vs. Cardiogenic shock with resolved shock  Off of pressors   Leukocytosis improving Blood cultures in process - NGTD Antibiotics per critical care   4. HTN  - back on low dose regimen; hx labile BP   5. Anoxic encephalopathy  - post arrest; concern for low change of meaningful recovery   6. ESRD: previously on HD per TTS schedule.  Started CRRT on 3/30 - stopped after clotting 3/31 PM.   - Intermittent HD today, 4/2.  Assess dialysis needs daily.    7. Anemia Ckd: Ferritin elevated from #5.  ESA qSat aranesp 200 mcg.    8.  COVID-19 infection: Incidental finding and s/p remdesivir. On hydrocortisone. 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    Subjective:   Last RRT therapy: Patient had 1.2 liters UF over 3/31 with CRRT prior to clotting then was stopped per our order.  She hasn't had pressors overnight - has remained hypertensive.  HD RN is in room getting her setup ready  Review of systems: unable to obtain secondary to intubated     Objective:   BP (!) 166/110   Pulse 90   Temp 100.2 F (37.9 C) (Axillary)   Resp 15   Ht 5' 4"  (1.626 m)   Wt 71.8 kg   SpO2 100%   BMI 27.17 kg/m   Physical Exam: General adult female in bed critically ill  HEENT normocephalic atraumatic  Lungs coarse mechanical breath sounds; vent 30/5 Heart S1S2 no rub Extremities trace to 1+ edema upper extremities and no lower extremity edema Neuro - not following commands no sedation running Access right chest tunneled catheter   Labs: BMET Recent Labs  Lab 01/08/20 0434 01/08/20 0434 01/08/20 1027 01/09/20 0350 01/09/20 1600 01/10/20 0430 01/10/20 1744 01/11/20 0359 01/12/20 0449   NA 136   < > 132* 131* 133* 134* 135 138 140  K 5.1   < > 5.4* 5.0 4.3 4.0 3.8 4.3 3.4*  CL 102  --   --  96* 99 99 102 104 104  CO2 18*  --   --  18* 19* 21* 22 22 23   GLUCOSE 115*  --   --  214* 105* 167* 306* 227* 173*  BUN 63*  --   --  79* 60* 36* 32* 38* 61*  CREATININE 3.44*  --   --  4.31* 3.25* 2.29* 1.86* 2.48* 3.63*  CALCIUM 7.5*  --   --  7.8* 7.7* 7.6* 7.4* 7.7* 7.8*  PHOS  --   --   --   --  5.9* 4.1 3.7 4.0 4.6   < > = values in this interval not displayed.   CBC Recent Labs  Lab 01/08/20 0434 01/08/20 1027 01/09/20 1600 01/10/20 0430 01/11/20 0359 01/12/20 0449  WBC 47.8*   < > 32.9* 28.5* 25.4* 20.9*  NEUTROABS 32.0*  --   --   --  22.5*  --   HGB 9.0*   < > 10.2* 9.5* 8.6* 8.2*  HCT 25.9*   < > 29.2* 27.1* 25.7* 25.4*  MCV 89.0   < > 85.4 85.5 90.8 94.8  PLT 192   < > 94* 89*  90* 85*   < > = values in this interval not displayed.      Medications:    . aspirin  81 mg Oral Daily  . carvedilol  6.25 mg Oral BID WC  . chlorhexidine gluconate (MEDLINE KIT)  15 mL Mouth Rinse BID  . Chlorhexidine Gluconate Cloth  6 each Topical Daily  . [START ON 01/13/2020] darbepoetin (ARANESP) injection - DIALYSIS  200 mcg Intravenous Q Sat-HD  . feeding supplement (PRO-STAT SUGAR FREE 64)  30 mL Per Tube BID  . insulin aspart  0-15 Units Subcutaneous Q4H  . mouth rinse  15 mL Mouth Rinse 10 times per day  . multivitamin  1 tablet Per Tube QHS  . pantoprazole (PROTONIX) IV  40 mg Intravenous QHS   Claudia Desanctis , MD 01/12/2020, 7:43 AM

## 2020-01-12 NOTE — Progress Notes (Signed)
PCCM Note Spoke with daughter. No interest in trach/PEG/LTACH. Plan for palliative extubation Monday. She and sister were wondering if they can stay with her after extubation (she will be 17 days out from diagnosis at that point), will check with unit manager but from my standpoint she is no longer infectious.  Erskine Emery MD PCCM

## 2020-01-13 LAB — CBC
HCT: 24.3 % — ABNORMAL LOW (ref 36.0–46.0)
Hemoglobin: 7.8 g/dL — ABNORMAL LOW (ref 12.0–15.0)
MCH: 31.5 pg (ref 26.0–34.0)
MCHC: 32.1 g/dL (ref 30.0–36.0)
MCV: 98 fL (ref 80.0–100.0)
Platelets: 90 10*3/uL — ABNORMAL LOW (ref 150–400)
RBC: 2.48 MIL/uL — ABNORMAL LOW (ref 3.87–5.11)
RDW: 18.8 % — ABNORMAL HIGH (ref 11.5–15.5)
WBC: 19.8 10*3/uL — ABNORMAL HIGH (ref 4.0–10.5)
nRBC: 0.2 % (ref 0.0–0.2)

## 2020-01-13 LAB — RENAL FUNCTION PANEL
Albumin: 1.2 g/dL — ABNORMAL LOW (ref 3.5–5.0)
Anion gap: 12 (ref 5–15)
BUN: 49 mg/dL — ABNORMAL HIGH (ref 8–23)
CO2: 24 mmol/L (ref 22–32)
Calcium: 7.3 mg/dL — ABNORMAL LOW (ref 8.9–10.3)
Chloride: 101 mmol/L (ref 98–111)
Creatinine, Ser: 3.01 mg/dL — ABNORMAL HIGH (ref 0.44–1.00)
GFR calc Af Amer: 19 mL/min — ABNORMAL LOW (ref >60)
GFR calc non Af Amer: 16 mL/min — ABNORMAL LOW (ref >60)
Glucose, Bld: 259 mg/dL — ABNORMAL HIGH (ref 70–99)
Phosphorus: 3.4 mg/dL (ref 2.5–4.6)
Potassium: 3.1 mmol/L — ABNORMAL LOW (ref 3.5–5.1)
Sodium: 137 mmol/L (ref 135–145)

## 2020-01-13 LAB — POTASSIUM: Potassium: 3.8 mmol/L (ref 3.5–5.1)

## 2020-01-13 LAB — GLUCOSE, CAPILLARY
Glucose-Capillary: 113 mg/dL — ABNORMAL HIGH (ref 70–99)
Glucose-Capillary: 149 mg/dL — ABNORMAL HIGH (ref 70–99)
Glucose-Capillary: 167 mg/dL — ABNORMAL HIGH (ref 70–99)
Glucose-Capillary: 196 mg/dL — ABNORMAL HIGH (ref 70–99)
Glucose-Capillary: 211 mg/dL — ABNORMAL HIGH (ref 70–99)
Glucose-Capillary: 220 mg/dL — ABNORMAL HIGH (ref 70–99)
Glucose-Capillary: 220 mg/dL — ABNORMAL HIGH (ref 70–99)

## 2020-01-13 LAB — MAGNESIUM: Magnesium: 2.1 mg/dL (ref 1.7–2.4)

## 2020-01-13 MED ORDER — DARBEPOETIN ALFA 200 MCG/0.4ML IJ SOSY
200.0000 ug | PREFILLED_SYRINGE | INTRAMUSCULAR | Status: DC
  Filled 2020-01-13: qty 0.4

## 2020-01-13 MED ORDER — POTASSIUM CHLORIDE 20 MEQ/15ML (10%) PO SOLN
40.0000 meq | Freq: Once | ORAL | Status: AC
  Administered 2020-01-13: 40 meq via ORAL
  Filled 2020-01-13: qty 30

## 2020-01-13 NOTE — Progress Notes (Signed)
Patient ID: Patricia [name], female   DOB: 1956/02/24, 64 y.o.   MRN: [id number]  [facility] Progress Note   Assessment/ Plan:   1.  Cardiac arrest: out-of-hospital arrest 01/03/2020 at end of HD, CPR 34mn, ROSC in ED. Recurrent cardiac arrest on 3/28 PEA. Nothing reversible found on imaging or labs. Per neuro and CCM  2. Acute hypoxic respiratory failure on vent per critical care  3. Sepsis - multifactorial septic vs. Cardiogenic - shock resolved Off of pressors   Leukocytosis improving Blood cultures in process - NGTD Antibiotics per critical care   4. HTN  - back on low dose regimen; hx labile BP   5. Anoxic encephalopathy / brain injury - post arrest; low chance of meaningful recovery per neuro - patient would not want trach/PEG/LTACH per family > plan is for terminal extubation on Monday  6. ESRD: usual HD is TTS. Started CRRT on 3/30 - stopped after clotting 3/31 PM.  Back on iHD now.  - had HD yest (Friday) in ICU off schedule. No need for HD today - Assess dialysis needs daily.    7. Anemia Ckd: Ferritin elevated from #5.  ESA qSat aranesp 200 mcg.  Hb low 7-8.5 range.   8.  COVID-19 infection: Incidental finding on admit,is now s/p remdesivir. 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  RKelly Splinter MD 01/13/2020, 2:53 PM       Subjective:   Seen in ICU, pt not responding    Objective:   BP (!) 178/81   Pulse (!) 101   Temp 99 F (37.2 C) (Axillary)   Resp 16   Ht 5' 4"  (1.626 m)   Wt 77 kg   SpO2 100%   BMI 29.14 kg/m   Physical Exam: General adult female in bed critically ill  HEENT normocephalic atraumatic  Lungs coarse mechanical breath sounds; vent 30/5 Heart S1S2 no rub Extremities 2-3+ diffuse edema upper extremities, 1-2+ edema of Legs  Neuro - not following commands no sedation running Access right chest tunneled catheter   Labs: BMET Recent Labs  Lab 01/09/20 0350 01/09/20 0350 01/09/20 1600  01/10/20 0430 01/10/20 1744 01/11/20 0359 01/12/20 0449 01/13/20 0500 01/13/20 1303  NA 131*  --  133* 134* 135 138 140 137  --   K 5.0   < > 4.3 4.0 3.8 4.3 3.4* 3.1* 3.8  CL 96*  --  99 99 102 104 104 101  --   CO2 18*  --  19* 21* 22 22 23 24   --   GLUCOSE 214*  --  105* 167* 306* 227* 173* 259*  --   BUN 79*  --  60* 36* 32* 38* 61* 49*  --   CREATININE 4.31*  --  3.25* 2.29* 1.86* 2.48* 3.63* 3.01*  --   CALCIUM 7.8*  --  7.7* 7.6* 7.4* 7.7* 7.8* 7.3*  --   PHOS  --   --  5.9* 4.1 3.7 4.0 4.6 3.4  --    < > = values in this interval not displayed.   CBC Recent Labs  Lab 01/08/20 0434 01/08/20 1027 01/10/20 0430 01/11/20 0359 01/12/20 0449 01/13/20 0839  WBC 47.8*   < > 28.5* 25.4* 20.9* 19.8*  NEUTROABS 32.0*  --   --  22.5*  --   --   HGB 9.0*   < > 9.5* 8.6* 8.2* 7.8*  HCT 25.9*   < > 27.1* 25.7* 25.4* 24.3*  MCV 89.0   < >  85.5 90.8 94.8 98.0  PLT 192   < > 89* 90* 85* 90*   < > = values in this interval not displayed.      Medications:    . aspirin  81 mg Oral Daily  . carvedilol  6.25 mg Oral BID WC  . chlorhexidine gluconate (MEDLINE KIT)  15 mL Mouth Rinse BID  . Chlorhexidine Gluconate Cloth  6 each Topical Daily  . [START ON 01/16/2020] darbepoetin (ARANESP) injection - DIALYSIS  200 mcg Intravenous Q Tue-HD  . feeding supplement (PRO-STAT SUGAR FREE 64)  30 mL Per Tube BID  . heparin injection (subcutaneous)  5,000 Units Subcutaneous Q8H  . insulin aspart  0-15 Units Subcutaneous Q4H  . mouth rinse  15 mL Mouth Rinse 10 times per day  . multivitamin  1 tablet Per Tube QHS  . pantoprazole sodium  40 mg Per Tube QHS  . white petrolatum   Topical BID

## 2020-01-13 NOTE — Progress Notes (Signed)
NAME:  Patricia [name], MRN:  [id number], DOB:  09-04-1956, LOS: 74 ADMISSION DATE:  12/18/2019, CONSULTATION DATE:  01/13/20 REFERRING MD:  Regenia Skeeter CHIEF COMPLAINT:  Cardiac Arrest   Brief History   64 y.o. F with PMH ESRD, anemia, HFrEF, Depression, Type 2 DM, HTN and obesity who presented in cardiac arrest.  Pt was in her usual state of health and went to dialysis 3/20 where she became nauseated and vomited, at home she had a syncopal episode and collapse. CPR initiated and ROSC after 15 mins.    History of present illness   Patricia [name] is a 64 y.o. F with PMH of  ESRD, anemia, HFrEF, Depression, Type 2 DM, HTN and obesity who presented in cardiac arrest. She lives with family and they state she has been in her usual state of health and went to dialysis today where she had an episode of nausea and vomiting and returned home.  There she became sweaty and collapsed, family started CPR immediately and called EMS who performed 15 minutes of CPR with ROSC, no mention of shockable rhythm.    In the ED, pt was not initially responsive and was intubated.  CT head was without acute findings, patient was hypertensive and febrile with a potassium of 2.8 and prolonged QTC.  No sign of STEMI on EKG.  Lactic acid was elevated and chest x-ray showed vascular congestion.  She was given 2 g of magnesium, broad-spectrum antibiotics and IV fluids.  Covid-19 resulted positive.  PCCM consulted for admission.  Family states that she has showed no symptoms of Covid, and they are unsure how she may have contracted this.  Past Medical History   has a past medical history of Allergy, Anemia, Anxiety, Blood transfusion without reported diagnosis, Cardiomyopathy ([place]) (11/2016), CHF (congestive heart failure) ([place]), CKD (chronic kidney disease), stage IV ([place]), Depression, Diabetes mellitus without complication ([place]), Diverticulitis, Dyspnea, Hypertension, and Obesity.   Significant Hospital Events   3/20 admit to  [facility] 3/21 TTM initiated to 33 3/22 completed TTM, rewarmed 3/27: hypertensive prior to HD.  Started hydral, had been on labetelol gtt.  3/28 AM briefly on levophed for hypotension. Became acutely hypotensive in early afternoon, followed by refractory hypotension then cardiac arrest (PEA per nurse).   PEA --> vfib. Defibrillated back to sinus.   Consults:  3/21 Nephrology 3/27 neuro  Procedures:  3/20 ET tube 3/20 CVC > 3/24 11/23/19: R IJ tunneled perm cath:   Significant Diagnostic Tests:  3/20 CT head>> no acute findings 3/21 EEG>> generalized background slowing, no epileptiform activity 3/21 echocardiogram-LVEF 30 to 35%, no regional wall motion abnormalities, moderate LVH.  Grade 1 diastolic dysfunction.  Mildly dilated LA, normal RV. 3/22 EEG-generalized background attenuation with generalized slowing c/w profound diffuse encephalopathy  3/26: MRI IMPRESSION: Small area of acute infarct in the right occipital cortex. This is more likely to be an embolus than anoxic brain injury.  Mild chronic microvascular ischemic changes in the white matter. Scattered areas of microhemorrhage in the brain correlate with hypertension history. 3/29 carotid u/s: Right Carotid: Velocities in the right ICA are consistent with a 1-39%  stenosis.         Atypical and low velocity waveforms visualized.   Left Carotid: Velocities in the left ICA are consistent with a 1-39%  stenosis.        Atypical and low velocity waveforms visualized.   Vertebrals: Bilateral vertebral arteries demonstrate antegrade flow.  Subclavians: Normal flow hemodynamics were seen in bilateral subclavian  arteries.    Micro Data:  3/20 SARS-Cov-2>> positive 3/22 sputum- GNRs> enterobacter cloacae (R cefazolin, otherwise sensitive) 3/20 blood: neg  Antimicrobials:  Cefepime 3/20 Flagyl 3/20  Vancomycin 3/20-3/22, 3/28 Zosyn 3/24>>3/28 merrem 3/28->  Interim history/subjective:   No events Gazing off to left, no obvious response to noise or noxious stimuli Chewing motions of mouth  Objective   Blood pressure (!) 147/68, pulse 96, temperature 97.9 F (36.6 C), temperature source Axillary, resp. rate 19, height 5\' 4"  (1.626 m), weight 77 kg, SpO2 100 %.    Vent Mode: PRVC FiO2 (%):  [30 %] 30 % Set Rate:  [18 bmp] 18 bmp Vt Set:  [430 mL] 430 mL PEEP:  [5 cmH20] 5 cmH20 Pressure Support:  [8 cmH20] 8 cmH20 Plateau Pressure:  [15 cmH20-17 cmH20] 17 cmH20   Intake/Output Summary (Last 24 hours) at 01/13/2020 0731 Last data filed at 01/12/2020 1800 Gross per 24 hour  Intake 575 ml  Output 2000 ml  Net -1425 ml   Filed Weights   01/12/20 0459 01/12/20 0656 01/12/20 1024  Weight: 71.8 kg 77.5 kg 77 kg   GEN: ill appearing woman lying in bed HEENT: ETT in place moderate thick secretions CV: RRR, ext warm PULM: Scattered rhonci, no accessory muscle use, triggering vent GI: soft, +BS EXT: trace edema NEURO:  Makes a chewing motion with mouth Roving gaze No response to pain Upgoing babinski Triggering vent +cough/gag PSYCH: cannot assess SKIN: no rashes, has lip skin tear and sacral skin breakdown  Pressure Injury 01/09/20 Lip Right;Upper Deep Tissue Pressure Injury - Purple or maroon localized area of discolored intact skin or blood-filled blister due to damage of underlying soft tissue from pressure and/or shear. (Active)  01/09/20 2000  Location: Lip  Location Orientation: Right;Upper  Staging: Deep Tissue Pressure Injury - Purple or maroon localized area of discolored intact skin or blood-filled blister due to damage of underlying soft tissue from pressure and/or shear.  Wound Description (Comments):   Present on Admission: No     Pressure Injury 01/10/20 Sacrum Lower Stage 2 -  Partial thickness loss of dermis presenting as a shallow open injury with a red, pink wound bed without slough. (Active)  01/10/20 2000  Location: Sacrum  Location  Orientation: Lower  Staging: Stage 2 -  Partial thickness loss of dermis presenting as a shallow open injury with a red, pink wound bed without slough.  Wound Description (Comments):   Present on Admission: No       Resolved Hospital Problem list     Assessment & Plan:  Initial OOHCardiac arrest, PEA/vfib: ROSC in 18min Repeated cardiac arrest 3/28 PEA - No clear cause identified, differential is torsades due to longstanding QTc or advanced vasoplegia with preload dependence worsened by HD; regardless, HD stable now, nothing reversible found on labs or imaging  - Maintain normal electrolytes and BPs  Anoxic brain injury and metabolic encephalopathy- EEG/MRI unrevealing.  Neurology has evaluated and do not see a meaningful chance of recovery.  Palliative involved. - Plan for terminal extubation Monday, patient would not want trach/PEG/LTACH per family - Continue to hold all sedation  Leukocytosis- fine to finish course of meropenem, not sure what we are treating, likely post arrest demargination  Acute anemia- unclear source, Hgb stabilized now, retrial of heparin, monitor for s/s of bleeding  Stable mild thrombocytopenia post arrest- monitor  ESRD on HD- iHD per nephrology   Best practice:  Diet: TF Pain/Anxiety/Delirium protocol (if indicated): avoid sedation  VAP  protocol (if indicated): per protocol DVT prophy: heparin GI prophylaxis: Protonix Glucose control: SSI Mobility: Bedrest Code Status: DNR Family Communication: will call tomorrow Disposition: ICU  Labs   CBC: Recent Labs  Lab 01/08/20 0434 01/08/20 1027 01/09/20 0350 01/09/20 1600 01/10/20 0430 01/11/20 0359 01/12/20 0449  WBC 47.8*  --  39.4* 32.9* 28.5* 25.4* 20.9*  NEUTROABS 32.0*  --   --   --   --  22.5*  --   HGB 9.0*   < > 6.0* 10.2* 9.5* 8.6* 8.2*  HCT 25.9*   < > 18.1* 29.2* 27.1* 25.7* 25.4*  MCV 89.0  --  91.4 85.4 85.5 90.8 94.8  PLT 192  --  PLATELET CLUMPS NOTED ON SMEAR, UNABLE TO  ESTIMATE 94* 89* 90* 85*   < > = values in this interval not displayed.    Basic Metabolic Panel: Recent Labs  Lab 01/10/20 0430 01/10/20 1744 01/11/20 0359 01/12/20 0449 01/13/20 0500  NA 134* 135 138 140 137  K 4.0 3.8 4.3 3.4* 3.1*  CL 99 102 104 104 101  CO2 21* 22 22 23 24   GLUCOSE 167* 306* 227* 173* 259*  BUN 36* 32* 38* 61* 49*  CREATININE 2.29* 1.86* 2.48* 3.63* 3.01*  CALCIUM 7.6* 7.4* 7.7* 7.8* 7.3*  MG 2.1  --  2.2 2.4 2.1  PHOS 4.1 3.7 4.0 4.6 3.4   GFR: Estimated Creatinine Clearance: 19.7 mL/min (A) (by C-G formula based on SCr of 3.01 mg/dL (H)). Recent Labs  Lab 01/09/20 0350 01/09/20 0957 01/09/20 1600 01/10/20 0430 01/11/20 0359 01/12/20 0449  PROCALCITON  --  8.02  --  4.24 3.23  --   WBC   < >  --  32.9* 28.5* 25.4* 20.9*   < > = values in this interval not displayed.    Liver Function Tests: Recent Labs  Lab 01/07/20 0255 01/07/20 0255 01/07/20 1520 01/07/20 1520 01/08/20 0434 01/08/20 0434 01/09/20 0350 01/09/20 1600 01/10/20 0430 01/10/20 1744 01/11/20 0359 01/12/20 0449 01/13/20 0500  AST 28  --  1,652*  --  2,683*  --  537*  --   --   --   --   --   --   ALT 20  --  1,089*  --  1,504*  --  820*  --   --   --   --   --   --   ALKPHOS 80  --  71  --  73  --  81  --   --   --   --   --   --   BILITOT 0.6  --  0.7  --  1.1  --  0.7  --   --   --   --   --   --   PROT 4.7*  --  3.6*  --  3.9*  --  3.6*  --   --   --   --   --   --   ALBUMIN 1.2*   < > <1.0*   < > 1.1*   < > <1.0*   < > 1.2* 1.2* 1.2* 1.2* 1.2*   < > = values in this interval not displayed.   No results for input(s): LIPASE, AMYLASE in the last 168 hours. No results for input(s): AMMONIA in the last 168 hours.  ABG    Component Value Date/Time   PHART 7.509 (H) 01/08/2020 1027   PCO2ART 21.6 (L) 01/08/2020 1027   PO2ART 70.0 (L) 01/08/2020 1027  HCO3 17.1 (L) 01/08/2020 1027   TCO2 18 (L) 01/08/2020 1027   ACIDBASEDEF 5.0 (H) 01/08/2020 1027   O2SAT 95.0  01/08/2020 1027     Coagulation Profile: Recent Labs  Lab 01/07/20 1707 01/09/20 0350  INR 2.0* 1.5*    Cardiac Enzymes: No results for input(s): CKTOTAL, CKMB, CKMBINDEX, TROPONINI in the last 168 hours.  HbA1C: HbA1c, POC (prediabetic range)  Date/Time Value Ref Range Status  10/18/2018 10:39 AM 6.0 5.7 - 6.4 % Final   Hgb A1c MFr Bld  Date/Time Value Ref Range Status  11/20/2019 01:00 PM 7.4 (H) 4.8 - 5.6 % Final    Comment:    (NOTE)         Prediabetes: 5.7 - 6.4         Diabetes: >6.4         Glycemic control for adults with diabetes: <7.0   06/16/2019 09:20 PM 7.0 (H) 4.8 - 5.6 % Final    Comment:    (NOTE) Pre diabetes:          5.7%-6.4% Diabetes:              >6.4% Glycemic control for   <7.0% adults with diabetes     CBG: Recent Labs  Lab 01/12/20 1140 01/12/20 1515 01/12/20 1917 01/13/20 0002 01/13/20 0400  GLUCAP 171* 188* 212* 196* 220*    This patient is critically ill with multiple organ system failure which requires frequent high complexity decision making, assessment, support, evaluation, and titration of therapies. This was completed through the application of advanced monitoring technologies and extensive interpretation of multiple databases. During this encounter critical care time was devoted to patient care services described in this note for 31 minutes.    Candee Furbish, MD 01/13/20 7:31 AM [facility]

## 2020-01-14 LAB — CBC
HCT: 23.4 % — ABNORMAL LOW (ref 36.0–46.0)
Hemoglobin: 7.5 g/dL — ABNORMAL LOW (ref 12.0–15.0)
MCH: 31.3 pg (ref 26.0–34.0)
MCHC: 32.1 g/dL (ref 30.0–36.0)
MCV: 97.5 fL (ref 80.0–100.0)
Platelets: 99 10*3/uL — ABNORMAL LOW (ref 150–400)
RBC: 2.4 MIL/uL — ABNORMAL LOW (ref 3.87–5.11)
RDW: 19.1 % — ABNORMAL HIGH (ref 11.5–15.5)
WBC: 15.8 10*3/uL — ABNORMAL HIGH (ref 4.0–10.5)
nRBC: 0 % (ref 0.0–0.2)

## 2020-01-14 LAB — RENAL FUNCTION PANEL
Albumin: 1.1 g/dL — ABNORMAL LOW (ref 3.5–5.0)
Anion gap: 12 (ref 5–15)
BUN: 68 mg/dL — ABNORMAL HIGH (ref 8–23)
CO2: 22 mmol/L (ref 22–32)
Calcium: 7.5 mg/dL — ABNORMAL LOW (ref 8.9–10.3)
Chloride: 104 mmol/L (ref 98–111)
Creatinine, Ser: 3.84 mg/dL — ABNORMAL HIGH (ref 0.44–1.00)
GFR calc Af Amer: 14 mL/min — ABNORMAL LOW (ref >60)
GFR calc non Af Amer: 12 mL/min — ABNORMAL LOW (ref >60)
Glucose, Bld: 255 mg/dL — ABNORMAL HIGH (ref 70–99)
Phosphorus: 3.7 mg/dL (ref 2.5–4.6)
Potassium: 3.6 mmol/L (ref 3.5–5.1)
Sodium: 138 mmol/L (ref 135–145)

## 2020-01-14 LAB — GLUCOSE, CAPILLARY
Glucose-Capillary: 230 mg/dL — ABNORMAL HIGH (ref 70–99)
Glucose-Capillary: 206 mg/dL — ABNORMAL HIGH (ref 70–99)
Glucose-Capillary: 215 mg/dL — ABNORMAL HIGH (ref 70–99)
Glucose-Capillary: 215 mg/dL — ABNORMAL HIGH (ref 70–99)
Glucose-Capillary: 145 mg/dL — ABNORMAL HIGH (ref 70–99)
Glucose-Capillary: 214 mg/dL — ABNORMAL HIGH (ref 70–99)

## 2020-01-14 LAB — RESPIRATORY PANEL BY RT PCR (FLU A&B, COVID)
Influenza A by PCR: NEGATIVE
Influenza B by PCR: NEGATIVE
SARS Coronavirus 2 by RT PCR: NEGATIVE

## 2020-01-14 LAB — MAGNESIUM: Magnesium: 2.2 mg/dL (ref 1.7–2.4)

## 2020-01-14 NOTE — Progress Notes (Signed)
Tomorrow, patient is 16 days out from COVID + swab.  She has no evidence of airway involvement from COVID.  The ask from family is to be with her in her final hours after palliative extubation tomorrow.  I will check with infection control but I am hopeful family can be in full PPE at bedside as the risk this far out is negligible in a patient without radiographic evidence of COVID pneumonia.  If there is a refusal of our infection prevention department to let this happen, will have to touch base with family and see if they want to wait until 4/10 when the patient is "cleared" so they can be with her.  Erskine Emery MD PCCM

## 2020-01-14 NOTE — Progress Notes (Addendum)
Patient ID: Patricia [name], female   DOB: 03/05/1956, 64 y.o.   MRN: [id number]  [facility] Progress Note   Assessment/ Plan:   1.  Cardiac arrest: out-of-hospital arrest 01/04/2020 at end of HD, CPR 86mn, ROSC in ED. Recurrent cardiac arrest on 3/28 PEA. Nothing reversible found on imaging or labs. Per notes plan is for palliative extubation tomorrow.   2. Acute hypoxic respiratory failure on vent per critical care  3. Sepsis - septic vs. Cardiogenic,  shock resolved, off pressors  4. HTN  - back on low dose regimen; hx labile BP   5. Anoxic encephalopathy / brain injury - post arrest; low chance of meaningful recovery per neuro - patient would not want trach/PEG/LTACH per family  6. ESRD: usual HD is TTS. Started CRRT on 3/30, dc'd 3/31. Back on iHD now, last HD was Friday off schedule. No indication for any further HD (see #1 above).     7.  COVID-19 infection: Incidental finding on admit,is now s/p remdesivir. Per CCM pt no longer needs airborne precautions.    RKelly Splinter MD 01/14/2020, 12:52 PM       Subjective:    Patient not examined today directly given COVID-19 + status, utilizing data taken from chart +/- discussions w/ providers and staff.      Objective:   BP (!) 165/72   Pulse 95   Temp 98.6 F (37 C) (Oral)   Resp 20   Ht 5' 4"  (1.626 m)   Wt 71.8 kg   SpO2 100%   BMI 27.17 kg/m   Physical Exam:  Patient not examined today directly given COVID-19 + status, utilizing data taken from chart +/- discussions w/ providers and staff.    Labs: BMET Recent Labs  Lab 01/09/20 1600 01/09/20 1600 01/10/20 0430 01/10/20 1744 01/11/20 0359 01/12/20 0449 01/13/20 0500 01/13/20 1303 01/14/20 0359  NA 133*  --  134* 135 138 140 137  --  138  K 4.3   < > 4.0 3.8 4.3 3.4* 3.1* 3.8 3.6  CL 99  --  99 102 104 104 101  --  104  CO2 19*  --  21* 22 22 23 24   --  22  GLUCOSE 105*  --  167* 306* 227* 173* 259*  --  255*  BUN 60*  --  36* 32* 38*  61* 49*  --  68*  CREATININE 3.25*  --  2.29* 1.86* 2.48* 3.63* 3.01*  --  3.84*  CALCIUM 7.7*  --  7.6* 7.4* 7.7* 7.8* 7.3*  --  7.5*  PHOS 5.9*  --  4.1 3.7 4.0 4.6 3.4  --  3.7   < > = values in this interval not displayed.   CBC Recent Labs  Lab 01/08/20 0434 01/08/20 1027 01/11/20 0359 01/12/20 0449 01/13/20 0839 01/14/20 0359  WBC 47.8*   < > 25.4* 20.9* 19.8* 15.8*  NEUTROABS 32.0*  --  22.5*  --   --   --   HGB 9.0*   < > 8.6* 8.2* 7.8* 7.5*  HCT 25.9*   < > 25.7* 25.4* 24.3* 23.4*  MCV 89.0   < > 90.8 94.8 98.0 97.5  PLT 192   < > 90* 85* 90* 99*   < > = values in this interval not displayed.      Medications:    . aspirin  81 mg Oral Daily  . carvedilol  6.25 mg Oral BID WC  . chlorhexidine gluconate (MEDLINE KIT)  15 mL Mouth Rinse BID  . Chlorhexidine Gluconate Cloth  6 each Topical Daily  . [START ON 01/16/2020] darbepoetin (ARANESP) injection - DIALYSIS  200 mcg Intravenous Q Tue-HD  . feeding supplement (PRO-STAT SUGAR FREE 64)  30 mL Per Tube BID  . heparin injection (subcutaneous)  5,000 Units Subcutaneous Q8H  . insulin aspart  0-15 Units Subcutaneous Q4H  . mouth rinse  15 mL Mouth Rinse 10 times per day  . multivitamin  1 tablet Per Tube QHS  . pantoprazole sodium  40 mg Per Tube QHS  . white petrolatum   Topical BID

## 2020-01-14 NOTE — Progress Notes (Signed)
Patient's repeat COVID test is negative, in addition to being 16 days out from her admission when she tested positive. No evidence of pulmonary disease, though sustained OOH cardiac arrest, PEA c/b anoxic brain injury. Family transitioning to comfort care and would like to be at bedside. Patient is no longer thought to be infectious, can have airborne/contact isolation discontinued  The staff - will need to continue with mask and eye protection  Family- will need to wear universal mask and recommend hand hygiene.  If no room available to move out of the unit, consider to move to the first room on the unit.

## 2020-01-14 NOTE — Progress Notes (Signed)
NAME:  Patricia [name], MRN:  [id number], DOB:  December 07, 1955, LOS: 76 ADMISSION DATE:  12/28/2019, CONSULTATION DATE:  01/14/20 REFERRING MD:  Regenia Skeeter CHIEF COMPLAINT:  Cardiac Arrest   Brief History   64 y.o. F with PMH ESRD, anemia, HFrEF, Depression, Type 2 DM, HTN and obesity who presented in cardiac arrest.  Pt was in her usual state of health and went to dialysis 3/20 where she became nauseated and vomited, at home she had a syncopal episode and collapse. CPR initiated and ROSC after 15 mins.    History of present illness   Patricia [name] is a 64 y.o. F with PMH of  ESRD, anemia, HFrEF, Depression, Type 2 DM, HTN and obesity who presented in cardiac arrest. She lives with family and they state she has been in her usual state of health and went to dialysis today where she had an episode of nausea and vomiting and returned home.  There she became sweaty and collapsed, family started CPR immediately and called EMS who performed 15 minutes of CPR with ROSC, no mention of shockable rhythm.    In the ED, pt was not initially responsive and was intubated.  CT head was without acute findings, patient was hypertensive and febrile with a potassium of 2.8 and prolonged QTC.  No sign of STEMI on EKG.  Lactic acid was elevated and chest x-ray showed vascular congestion.  She was given 2 g of magnesium, broad-spectrum antibiotics and IV fluids.  Covid-19 resulted positive.  PCCM consulted for admission.  Family states that she has showed no symptoms of Covid, and they are unsure how she may have contracted this.  Past Medical History   has a past medical history of Allergy, Anemia, Anxiety, Blood transfusion without reported diagnosis, Cardiomyopathy ([place]) (11/2016), CHF (congestive heart failure) ([place]), CKD (chronic kidney disease), stage IV ([place]), Depression, Diabetes mellitus without complication ([place]), Diverticulitis, Dyspnea, Hypertension, and Obesity.   Significant Hospital Events   3/20 admit to [facility]  3/21 TTM initiated to 33 3/22 completed TTM, rewarmed 3/27: hypertensive prior to HD.  Started hydral, had been on labetelol gtt.  3/28 AM briefly on levophed for hypotension. Became acutely hypotensive in early afternoon, followed by refractory hypotension then cardiac arrest (PEA per nurse).   PEA --> vfib. Defibrillated back to sinus.   Consults:  3/21 Nephrology 3/27 neuro  Procedures:  3/20 ET tube 3/20 CVC > 3/24 11/23/19: R IJ tunneled perm cath:   Significant Diagnostic Tests:  3/20 CT head>> no acute findings 3/21 EEG>> generalized background slowing, no epileptiform activity 3/21 echocardiogram-LVEF 30 to 35%, no regional wall motion abnormalities, moderate LVH.  Grade 1 diastolic dysfunction.  Mildly dilated LA, normal RV. 3/22 EEG-generalized background attenuation with generalized slowing c/w profound diffuse encephalopathy  3/26: MRI IMPRESSION: Small area of acute infarct in the right occipital cortex. This is more likely to be an embolus than anoxic brain injury.  Mild chronic microvascular ischemic changes in the white matter. Scattered areas of microhemorrhage in the brain correlate with hypertension history. 3/29 carotid u/s: Right Carotid: Velocities in the right ICA are consistent with a 1-39%  stenosis.         Atypical and low velocity waveforms visualized.   Left Carotid: Velocities in the left ICA are consistent with a 1-39%  stenosis.        Atypical and low velocity waveforms visualized.   Vertebrals: Bilateral vertebral arteries demonstrate antegrade flow.  Subclavians: Normal flow hemodynamics were seen in bilateral subclavian  arteries.    Micro Data:  3/20 SARS-Cov-2>> positive 3/22 sputum- GNRs> enterobacter cloacae (R cefazolin, otherwise sensitive) 3/20 blood: neg  Antimicrobials:  Cefepime 3/20 Flagyl 3/20  Vancomycin 3/20-3/22, 3/28 Zosyn 3/24>>3/28 merrem 3/28->  Interim history/subjective:  No  events Gazing off to left, did open eyes in response to voice and pain this AM. Chewing motions of mouth  Objective   Blood pressure (!) 159/79, pulse 90, temperature 98.8 F (37.1 C), temperature source Oral, resp. rate 18, height 5\' 4"  (1.626 m), weight 71.8 kg, SpO2 100 %.    Vent Mode: PRVC FiO2 (%):  [30 %] 30 % Set Rate:  [18 bmp] 18 bmp Vt Set:  [430 mL] 430 mL PEEP:  [5 cmH20] 5 cmH20 Plateau Pressure:  [16 cmH20] 16 cmH20   Intake/Output Summary (Last 24 hours) at 01/14/2020 0728 Last data filed at 01/14/2020 0600 Gross per 24 hour  Intake 1995.72 ml  Output 1800 ml  Net 195.72 ml   Filed Weights   01/12/20 0656 01/12/20 1024 01/14/20 0406  Weight: 77.5 kg 77 kg 71.8 kg   GEN: ill appearing woman lying in bed HEENT: ETT in place moderate thick secretions CV: RRR, ext warm PULM: Scattered rhonci, no accessory muscle use, triggering vent GI: soft, +BS EXT: trace edema NEURO:  Makes a chewing motion with mouth Roving gaze Opens eyes to sternal rub No response to upper or lower noxious stimuli Upgoing babinski Triggering vent +cough/gag PSYCH: cannot assess SKIN: no rashes, has lip skin tear and sacral skin breakdown  Pressure Injury 01/09/20 Lip Right;Upper Deep Tissue Pressure Injury - Purple or maroon localized area of discolored intact skin or blood-filled blister due to damage of underlying soft tissue from pressure and/or shear. (Active)  01/09/20 2000  Location: Lip  Location Orientation: Right;Upper  Staging: Deep Tissue Pressure Injury - Purple or maroon localized area of discolored intact skin or blood-filled blister due to damage of underlying soft tissue from pressure and/or shear.  Wound Description (Comments):   Present on Admission: No     Pressure Injury 01/10/20 Sacrum Lower Stage 2 -  Partial thickness loss of dermis presenting as a shallow open injury with a red, pink wound bed without slough. (Active)  01/10/20 2000  Location: Sacrum   Location Orientation: Lower  Staging: Stage 2 -  Partial thickness loss of dermis presenting as a shallow open injury with a red, pink wound bed without slough.  Wound Description (Comments):   Present on Admission: No       Resolved Hospital Problem list     Assessment & Plan:  Initial OOHCardiac arrest, PEA/vfib: ROSC in 50min Repeated cardiac arrest 3/28 PEA - No clear cause identified, differential is torsades due to longstanding QTc or advanced vasoplegia with preload dependence worsened by HD; regardless, HD stable now, nothing reversible found on labs or imaging  - Maintain normal electrolytes and BPs  Anoxic brain injury and metabolic encephalopathy- EEG/MRI unrevealing.  Neurology has evaluated and do not see a meaningful chance of recovery.  Palliative involved. - Plan for terminal extubation Monday, patient would not want trach/PEG/LTACH per family - Continue to hold all sedation  Leukocytosis- fine to finish course of meropenem, not sure what we are treating, likely post arrest demargination  Acute anemia- unclear source, Hgb stabilized now, retrial of heparin, monitor for s/s of bleeding  Stable mild thrombocytopenia post arrest- monitor  ESRD on HD- iHD per nephrology   Best practice:  Diet: TF Pain/Anxiety/Delirium protocol (if indicated):  avoid sedation  VAP protocol (if indicated): per protocol DVT prophy: heparin GI prophylaxis: Protonix Glucose control: SSI Mobility: Bedrest Code Status: DNR Family Communication: will call today after speaking with [employer] director regarding family visitiation Disposition: ICU   Candee Furbish, MD 01/14/20 7:28 AM [facility]

## 2020-01-15 ENCOUNTER — Encounter (HOSPITAL_COMMUNITY): Payer: Self-pay | Admitting: *Deleted

## 2020-01-15 DIAGNOSIS — J9600 Acute respiratory failure, unspecified whether with hypoxia or hypercapnia: Secondary | ICD-10-CM

## 2020-01-15 DIAGNOSIS — L8990 Pressure ulcer of unspecified site, unspecified stage: Secondary | ICD-10-CM | POA: Insufficient documentation

## 2020-01-15 LAB — RENAL FUNCTION PANEL
Albumin: 1.3 g/dL — ABNORMAL LOW (ref 3.5–5.0)
Anion gap: 10 (ref 5–15)
BUN: 86 mg/dL — ABNORMAL HIGH (ref 8–23)
CO2: 23 mmol/L (ref 22–32)
Calcium: 7.8 mg/dL — ABNORMAL LOW (ref 8.9–10.3)
Chloride: 107 mmol/L (ref 98–111)
Creatinine, Ser: 4.57 mg/dL — ABNORMAL HIGH (ref 0.44–1.00)
GFR calc Af Amer: 11 mL/min — ABNORMAL LOW (ref >60)
GFR calc non Af Amer: 10 mL/min — ABNORMAL LOW (ref >60)
Glucose, Bld: 239 mg/dL — ABNORMAL HIGH (ref 70–99)
Phosphorus: 4.3 mg/dL (ref 2.5–4.6)
Potassium: 3.6 mmol/L (ref 3.5–5.1)
Sodium: 140 mmol/L (ref 135–145)

## 2020-01-15 LAB — CULTURE, BLOOD (ROUTINE X 2)
Culture: NO GROWTH
Culture: NO GROWTH
Special Requests: ADEQUATE
Special Requests: ADEQUATE

## 2020-01-15 LAB — MAGNESIUM: Magnesium: 2.3 mg/dL (ref 1.7–2.4)

## 2020-01-15 LAB — GLUCOSE, CAPILLARY
Glucose-Capillary: 193 mg/dL — ABNORMAL HIGH (ref 70–99)
Glucose-Capillary: 207 mg/dL — ABNORMAL HIGH (ref 70–99)
Glucose-Capillary: 208 mg/dL — ABNORMAL HIGH (ref 70–99)
Glucose-Capillary: 211 mg/dL — ABNORMAL HIGH (ref 70–99)
Glucose-Capillary: 230 mg/dL — ABNORMAL HIGH (ref 70–99)
Glucose-Capillary: 243 mg/dL — ABNORMAL HIGH (ref 70–99)

## 2020-01-15 MED ORDER — DEXTROSE 5 % IV SOLN: INTRAVENOUS | Status: DC

## 2020-01-15 MED ORDER — ACETAMINOPHEN 650 MG RE SUPP: 650.0000 mg | Freq: Four times a day (QID) | RECTAL | Status: DC | PRN

## 2020-01-15 MED ORDER — MORPHINE SULFATE (PF) 2 MG/ML IV SOLN
2.0000 mg | INTRAVENOUS | Status: DC | PRN
  Administered 2020-01-15 – 2020-01-20 (×2): 2 mg via INTRAVENOUS
  Filled 2020-01-15: qty 1
  Filled 2020-01-15: qty 2
  Filled 2020-01-15: qty 1

## 2020-01-15 MED ORDER — DIPHENHYDRAMINE HCL 50 MG/ML IJ SOLN: 25.0000 mg | INTRAMUSCULAR | Status: DC | PRN

## 2020-01-15 MED ORDER — GLYCOPYRROLATE 0.2 MG/ML IJ SOLN
0.1000 mg | Freq: Once | INTRAMUSCULAR | Status: AC
  Administered 2020-01-15: 0.1 mg via INTRAVENOUS
  Filled 2020-01-15: qty 1

## 2020-01-15 MED ORDER — POLYVINYL ALCOHOL 1.4 % OP SOLN
1.0000 [drp] | Freq: Four times a day (QID) | OPHTHALMIC | Status: DC | PRN
  Filled 2020-01-15: qty 15

## 2020-01-15 MED ORDER — CHLORHEXIDINE GLUCONATE CLOTH 2 % EX PADS
6.0000 | MEDICATED_PAD | Freq: Every day | CUTANEOUS | Status: DC
  Administered 2020-01-17 – 2020-01-20 (×3): 6 via TOPICAL

## 2020-01-15 MED ORDER — ACETAMINOPHEN 325 MG PO TABS
650.0000 mg | ORAL_TABLET | Freq: Four times a day (QID) | ORAL | Status: DC | PRN
  Administered 2020-01-18 (×2): 650 mg via ORAL
  Filled 2020-01-15 (×2): qty 2

## 2020-01-15 NOTE — Progress Notes (Signed)
Nutrition Brief Note  Chart reviewed. Pt now transitioning to comfort care. Per MD notes, plan for terminal extubation today when family arrives. No further nutrition interventions warranted at this time.  Please re-consult as needed.   Loistine Chance, RD, LDN, [place] Registered Dietitian II Certified Diabetes Care and Education Specialist Please refer to [facility] for RD and/or RD on-call/weekend/after hours pager

## 2020-01-15 NOTE — Progress Notes (Signed)
Patient ID: Patricia [name], female   DOB: July 21, 1956, 64 y.o.   MRN: [id number]  [facility] Progress Note  Outpt HD: [facility] TTS    400/800  62kg  Hep 1900  R IJ TDC  - mircera 225 q2wks , last 3/18  - calc tiw  Assessment/ Plan:   1.  Cardiac arrest: out-of-hospital arrest 12/20/2019 at end of HD, CPR 70mn, ROSC in ED. Recurrent cardiac arrest on 3/28 PEA. Nothing reversible found on imaging or labs. Per notes plan is for palliative extubation today.   2. Acute hypoxic respiratory failure on vent per critical care  3. Sepsis - septic vs. Cardiogenic,  shock resolved, off pressors  4. HTN / volume  - back on low dose regimen; hx labile BP  - vol overloaded w/ diffuse edema  5. Anoxic encephalopathy / brain injury - post arrest; low chance of meaningful recovery per neuro - patient would not want trach/PEG/LTACH per family  6. ESRD: usual HD is TTS. Started CRRT on 3/30, dc'd 3/31. Back on iHD now, last HD was Friday off schedule. Will place orders orders for tomorrow in case of survival after extubation.   7.  COVID-19 infection: Incidental finding on admit,is now s/p remdesivir. Per CCM pt no longer needs airborne precautions.    RKelly Splinter MD 01/15/2020, 3:01 PM       Subjective:   Pt not responsive on vent     Objective:   BP (!) 114/59   Pulse (!) 104   Temp 97.8 F (36.6 C) (Axillary)   Resp 17   Ht 5' 4"  (1.626 m)   Wt 71.8 kg   SpO2 100%   BMI 27.17 kg/m   Physical Exam: General adult female in bed critically ill  HEENT normocephalic atraumatic  Lungs coarse mechanical breath sounds; vent 30/5 Heart S1S2 no rub Extremities 2-3+ diffuse edema upper extremities, 1-2+ edema of Legs  Neuro - not following commands no sedation running Access right chest tunneled catheter   Labs: BMET Recent Labs  Lab 01/10/20 0430 01/10/20 0430 01/10/20 1744 01/11/20 0359 01/12/20 0449 01/13/20 0500 01/13/20 1303 01/14/20 0359 01/15/20 0344  NA 134*   --  135 138 140 137  --  138 140  K 4.0   < > 3.8 4.3 3.4* 3.1* 3.8 3.6 3.6  CL 99  --  102 104 104 101  --  104 107  CO2 21*  --  22 22 23 24   --  22 23  GLUCOSE 167*  --  306* 227* 173* 259*  --  255* 239*  BUN 36*  --  32* 38* 61* 49*  --  68* 86*  CREATININE 2.29*  --  1.86* 2.48* 3.63* 3.01*  --  3.84* 4.57*  CALCIUM 7.6*  --  7.4* 7.7* 7.8* 7.3*  --  7.5* 7.8*  PHOS 4.1  --  3.7 4.0 4.6 3.4  --  3.7 4.3   < > = values in this interval not displayed.   CBC Recent Labs  Lab 01/11/20 0359 01/12/20 0449 01/13/20 0839 01/14/20 0359  WBC 25.4* 20.9* 19.8* 15.8*  NEUTROABS 22.5*  --   --   --   HGB 8.6* 8.2* 7.8* 7.5*  HCT 25.7* 25.4* 24.3* 23.4*  MCV 90.8 94.8 98.0 97.5  PLT 90* 85* 90* 99*      Medications:    . aspirin  81 mg Oral Daily  . carvedilol  6.25 mg Oral BID WC  . chlorhexidine  gluconate (MEDLINE KIT)  15 mL Mouth Rinse BID  . Chlorhexidine Gluconate Cloth  6 each Topical Daily  . [START ON 01/16/2020] darbepoetin (ARANESP) injection - DIALYSIS  200 mcg Intravenous Q Tue-HD  . mouth rinse  15 mL Mouth Rinse 10 times per day  . multivitamin  1 tablet Per Tube QHS  . pantoprazole sodium  40 mg Per Tube QHS  . white petrolatum   Topical BID

## 2020-01-15 NOTE — Progress Notes (Signed)
Provider gave verbal order to discontinue dextrose drip.

## 2020-01-15 NOTE — Consult Note (Signed)
Patricia [name] wound follow up Patient receiving care in [facility] 2M15. Wound type: Full thickness pressure injury to right upper lip from ETT Measurement: Wound bed: soft yellow/brown Drainage (amount, consistency, odor) none Periwound: intact Dressing procedure/placement/frequency: Continue vaseline and gauze as long as the ETT is in place. Monitor the wound area(s) for worsening of condition such as: Signs/symptoms of infection,  Increase in size,  Development of or worsening of odor, Development of pain, or increased pain at the affected locations.  Notify the medical team if any of these develop. Val Riles, RN, MSN, CWOCN, CNS-BC, pager [phone]

## 2020-01-15 NOTE — Procedures (Signed)
Extubation Procedure Note  Patient Details:   Name: Patricia [name] DOB: June 19, 1956 MRN: [id number]   Airway Documentation:    Vent end date: 01/15/20 Vent end time: 1512   Evaluation Pt extubated to RA per withdrawal of life protocol   Vilinda Blanks 01/15/2020, 3:13 PM

## 2020-01-15 NOTE — Progress Notes (Signed)
NAME:  Patricia [name], MRN:  [id number], DOB:  [birth year], LOS: 41 ADMISSION DATE:  12/14/2019, CONSULTATION DATE:  01/15/20 REFERRING MD:  Regenia Skeeter CHIEF COMPLAINT:  Cardiac Arrest   Brief History   64 y.o. F with PMH ESRD, anemia, HFrEF, Depression, Type 2 DM, HTN and obesity who presented in cardiac arrest.  Pt was in her usual state of health and went to dialysis 3/20 where she became nauseated and vomited, at home she had a syncopal episode and collapse. CPR initiated and ROSC after 15 mins.    History of present illness   Patricia [name] is a 64 y.o. F with PMH of  ESRD, anemia, HFrEF, Depression, Type 2 DM, HTN and obesity who presented in cardiac arrest. She lives with family and they state she has been in her usual state of health and went to dialysis today where she had an episode of nausea and vomiting and returned home.  There she became sweaty and collapsed, family started CPR immediately and called EMS who performed 15 minutes of CPR with ROSC, no mention of shockable rhythm.    In the ED, pt was not initially responsive and was intubated.  CT head was without acute findings, patient was hypertensive and febrile with a potassium of 2.8 and prolonged QTC.  No sign of STEMI on EKG.  Lactic acid was elevated and chest x-ray showed vascular congestion.  She was given 2 g of magnesium, broad-spectrum antibiotics and IV fluids.  Covid-19 resulted positive.  PCCM consulted for admission.  Family states that she has showed no symptoms of Covid, and they are unsure how she may have contracted this.  Past Medical History   has a past medical history of Allergy, Anemia, Anxiety, Blood transfusion without reported diagnosis, Cardiomyopathy ([place]) (11/2016), CHF (congestive heart failure) ([place]), CKD (chronic kidney disease), stage IV ([place]), Depression, Diabetes mellitus without complication ([place]), Diverticulitis, Dyspnea, Hypertension, and Obesity.   Significant Hospital Events   3/20 admit to  [facility] 3/21 TTM initiated to 33 3/22 completed TTM, rewarmed 3/27: hypertensive prior to HD.  Started hydral, had been on labetelol gtt.  3/28 AM briefly on levophed for hypotension. Became acutely hypotensive in early afternoon, followed by refractory hypotension then cardiac arrest (PEA per nurse).   PEA --> vfib. Defibrillated back to sinus.   Consults:  3/21 Nephrology 3/27 neuro  Procedures:  3/20 ET tube >> 3/20 CVC > 3/24 11/23/19: R IJ tunneled perm cath:   Significant Diagnostic Tests:  3/20 CT head>> no acute findings 3/21 EEG>> generalized background slowing, no epileptiform activity 3/21 echocardiogram-LVEF 30 to 35%, no regional wall motion abnormalities, moderate LVH.  Grade 1 diastolic dysfunction.  Mildly dilated LA, normal RV. 3/22 EEG-generalized background attenuation with generalized slowing c/w profound diffuse encephalopathy  3/26: MRI IMPRESSION: Small area of acute infarct in the right occipital cortex. This is more likely to be an embolus than anoxic brain injury.  Mild chronic microvascular ischemic changes in the white matter. Scattered areas of microhemorrhage in the brain correlate with hypertension history. 3/29 carotid u/s: Right Carotid: Velocities in the right ICA are consistent with a 1-39%  stenosis.         Atypical and low velocity waveforms visualized.   Left Carotid: Velocities in the left ICA are consistent with a 1-39%  stenosis.        Atypical and low velocity waveforms visualized.   Vertebrals: Bilateral vertebral arteries demonstrate antegrade flow.  Subclavians: Normal flow hemodynamics were seen in bilateral  subclavian        arteries.    Micro Data:  3/20 SARS-Cov-2>> positive 3/22 sputum- GNRs> enterobacter cloacae (R cefazolin, otherwise sensitive) 3/20 blood: neg  Antimicrobials:  Cefepime 3/20 Flagyl 3/20  Vancomycin 3/20-3/22, 3/28 Zosyn 3/24>>3/28 merrem 3/28-> off  Interim  history/subjective:   Critically ill, intubated On no sedation Afebrile  Objective   Blood pressure (!) 124/56, pulse (!) 103, temperature 97.8 F (36.6 C), temperature source Axillary, resp. rate 18, height 5\' 4"  (1.626 m), weight 71.8 kg, SpO2 100 %.    Vent Mode: PRVC FiO2 (%):  [30 %] 30 % Set Rate:  [18 bmp] 18 bmp Vt Set:  [430 mL] 430 mL PEEP:  [5 cmH20] 5 cmH20 Plateau Pressure:  [17 cmH20-22 cmH20] 17 cmH20   Intake/Output Summary (Last 24 hours) at 01/15/2020 1359 Last data filed at 01/15/2020 1300 Gross per 24 hour  Intake 1080 ml  Output 1000 ml  Net 80 ml   Filed Weights   01/12/20 0656 01/12/20 1024 01/14/20 0406  Weight: 77.5 kg 77 kg 71.8 kg   GEN: ill appearing woman lying in bed HEENT: ETT in place, mild pallor, no icterus CV: RRR, ext warm PULM: Scattered rhonci, no accessory muscle use GI: soft, +BS EXT: trace edema NEURO: Eyes open, does not follow commands, does not track, no purposeful movement Makes a chewing motion with mouth  PSYCH: cannot assess SKIN: no rashes, has lip skin tear and sacral skin breakdown  Pressure Injury 01/09/20 Lip Right;Upper Deep Tissue Pressure Injury - Purple or maroon localized area of discolored intact skin or blood-filled blister due to damage of underlying soft tissue from pressure and/or shear. (Active)  01/09/20 2000  Location: Lip  Location Orientation: Right;Upper  Staging: Deep Tissue Pressure Injury - Purple or maroon localized area of discolored intact skin or blood-filled blister due to damage of underlying soft tissue from pressure and/or shear.  Wound Description (Comments):   Present on Admission: No     Pressure Injury 01/10/20 Sacrum Lower Stage 2 -  Partial thickness loss of dermis presenting as a shallow open injury with a red, pink wound bed without slough. (Active)  01/10/20 2000  Location: Sacrum  Location Orientation: Lower  Staging: Stage 2 -  Partial thickness loss of dermis presenting as a  shallow open injury with a red, pink wound bed without slough.  Wound Description (Comments):   Present on Admission: No       Resolved Hospital Problem list     Assessment & Plan:  Initial OOHCardiac arrest, PEA/vfib: ROSC in 54min Repeated cardiac arrest 3/28 PEA - No clear cause identified, differential is torsades due to longstanding QTc or advanced vasoplegia with preload dependence worsened by HD;  -Hemodynamically stable - Maintain normal electrolytes and BPs  Anoxic brain injury and metabolic encephalopathy- EEG/MRI unrevealing.  Neurology has evaluated and do not see a meaningful chance of recovery.  Palliative involved. - Continue to hold all sedation  Leukocytosis-completed course of meropenem  Acute anemia- unclear source, Hgb stabilized now, monitor, heparin restarted  Stable mild thrombocytopenia post arrest- monitor  ESRD on HD- iHD per nephrology   Goals of care -discussed again with 2 daughters at bedside,  they are very clear that mom would not want tracheostomy or permanent tubes and that she has suffered enough.  The same time they felt like they were being pressured into withdrawal of life support.  They also had concerns about her receiving morphine afterwards.  All these concerns  were addressed.  They are now agreeable to withdrawal of ventilator, if she does okay then they would want full medical care, however if she struggles to breathe then they are okay with her receiving morphine and full comfort care.  Orders placed accordingly  Best practice:  Diet: TF Pain/Anxiety/Delirium protocol (if indicated): avoid sedation  VAP protocol (if indicated): per protocol DVT prophy: heparin GI prophylaxis: Protonix Glucose control: SSI Mobility: Bedrest Code Status: DNR Family Communication: Daughters at bedside Disposition: ICU   The patient is critically ill with multiple organ systems failure and requires high complexity decision making for assessment and  support, frequent evaluation and titration of therapies, application of advanced monitoring technologies and extensive interpretation of multiple databases. Critical Care Time devoted to patient care services described in this note independent of APP/resident  time is 32 minutes.    Leanna Sato Elsworth Soho, MD 01/15/20 1:59 PM [facility]

## 2020-01-15 NOTE — Progress Notes (Signed)
Patient appears increasingly uncomfortable with HR in 120's, RR in 50's and restlessly moving head in bed. Nasal cannula increased from 2L to 5L after O2 dropped to 84%. O2 saturation increased to 86%. Non-rebreather placed at 15L and O2 saturation improved to 100%.Notified daughter Almon Register that patient appears to be declining and she responded that she would come back to bedside. PRN morphine 2mg  given for patient comfort. HR 116, RR 27. When daughters arrived at bedside I explained that PRN morphine was given for patient's comfort.

## 2020-01-15 NOTE — Progress Notes (Signed)
Pt transported on ventilator to 32M. No complications noted.

## 2020-01-16 DIAGNOSIS — N186 End stage renal disease: Secondary | ICD-10-CM | POA: Diagnosis not present

## 2020-01-16 DIAGNOSIS — G931 Anoxic brain damage, not elsewhere classified: Secondary | ICD-10-CM | POA: Diagnosis not present

## 2020-01-16 DIAGNOSIS — J690 Pneumonitis due to inhalation of food and vomit: Secondary | ICD-10-CM | POA: Diagnosis not present

## 2020-01-16 DIAGNOSIS — R1310 Dysphagia, unspecified: Secondary | ICD-10-CM

## 2020-01-16 DIAGNOSIS — Z515 Encounter for palliative care: Secondary | ICD-10-CM | POA: Diagnosis not present

## 2020-01-16 LAB — GLUCOSE, CAPILLARY
Glucose-Capillary: 18 mg/dL — CL (ref 70–99)
Glucose-Capillary: 191 mg/dL — ABNORMAL HIGH (ref 70–99)
Glucose-Capillary: 206 mg/dL — ABNORMAL HIGH (ref 70–99)
Glucose-Capillary: 217 mg/dL — ABNORMAL HIGH (ref 70–99)
Glucose-Capillary: 217 mg/dL — ABNORMAL HIGH (ref 70–99)
Glucose-Capillary: 219 mg/dL — ABNORMAL HIGH (ref 70–99)
Glucose-Capillary: 232 mg/dL — ABNORMAL HIGH (ref 70–99)

## 2020-01-16 MED ORDER — PANTOPRAZOLE SODIUM 40 MG IV SOLR
40.0000 mg | Freq: Every day | INTRAVENOUS | Status: DC
  Administered 2020-01-16 – 2020-01-23 (×8): 40 mg via INTRAVENOUS
  Filled 2020-01-16 (×8): qty 40

## 2020-01-16 MED ORDER — LABETALOL HCL 5 MG/ML IV SOLN
20.0000 mg | INTRAVENOUS | Status: DC | PRN
  Administered 2020-01-16: 10 mg via INTRAVENOUS
  Administered 2020-01-17 – 2020-01-21 (×4): 20 mg via INTRAVENOUS
  Filled 2020-01-16 (×5): qty 4

## 2020-01-16 MED ORDER — HEPARIN SODIUM (PORCINE) 5000 UNIT/ML IJ SOLN
5000.0000 [IU] | Freq: Three times a day (TID) | INTRAMUSCULAR | Status: DC
  Administered 2020-01-16 – 2020-01-17 (×3): 5000 [IU] via SUBCUTANEOUS
  Filled 2020-01-16 (×3): qty 1

## 2020-01-16 MED ORDER — ORAL CARE MOUTH RINSE
15.0000 mL | Freq: Two times a day (BID) | OROMUCOSAL | Status: DC
  Administered 2020-01-16 – 2020-01-22 (×12): 15 mL via OROMUCOSAL

## 2020-01-16 MED ORDER — METOPROLOL TARTRATE 5 MG/5ML IV SOLN
5.0000 mg | Freq: Four times a day (QID) | INTRAVENOUS | Status: DC
  Administered 2020-01-16 – 2020-01-18 (×8): 5 mg via INTRAVENOUS
  Filled 2020-01-16 (×9): qty 5

## 2020-01-16 NOTE — Progress Notes (Signed)
Nutrition Follow-up / Consult  DOCUMENTATION CODES:   Not applicable  INTERVENTION:   After Cortrak placed tomorrow, begin TF:   Nepro at 20 ml/h, increase by 10 ml every 4 hours to goal rate of 50 ml/h (1200 ml per day)   Provides 2160 kcal, 97 gm protein, 872 ml free water daily   Continue Rena-vit per tube once daily  NUTRITION DIAGNOSIS:   Inadequate oral intake related to acute illness as evidenced by NPO status.  Ongoing  GOAL:   Patient will meet greater than or equal to 90% of their needs  Unmet  MONITOR:   TF tolerance, Skin, Labs  REASON FOR ASSESSMENT:   Consult Enteral/tube feeding initiation and management  ASSESSMENT:   64 yo female admitted post cardiac arrest, COVID19. PMH includes ESRD on HD, CHF, DM, HTN, STEMI  COVID-19 treatment completed and isolation has been discontinued.   Patient was extubated 4/5 with no plans to re-intubate. Palliative Care team is following. Patient is DNR, but will continue other medical support for now. Family wants a trial of TF via NG tube and to wait a few days to see how she progresses before deciding on PEG. Cortrak has been ordered, can be placed 4/7 as Cortrak service is available M-W-F. Received MD Consult for TF initiation and management.  She was too sleepy for swallow evaluation with SLP today.  HD planned for today.  Labs reviewed.  CBG's: 217-206 this morning  Medications reviewed and include Aranesp, Rena-vit.   Weight variable since admission, currently 74.5 kg Lowest weight since admission is 62.6 kg on admission. Outpatient EDW 62 kg.  New stage 2 pressure injury to sacrum and DTI to upper lip.  Diet Order:   Diet Order            Diet NPO time specified  Diet effective now              EDUCATION NEEDS:   Not appropriate for education at this time  Skin:  Skin Assessment: Skin Integrity Issues: Skin Integrity Issues:: DTI, Stage II DTI: upper lip Stage II: sacrum  Last BM:   4/6 type 7, rectal tube  Height:   Ht Readings from Last 1 Encounters:  01/08/20 5\' 4"  (1.626 m)    Weight:   Wt Readings from Last 1 Encounters:  01/16/20 74.5 kg    BMI:  Body mass index is 28.19 kg/m.  Estimated Nutritional Needs:   Kcal:  1860-2170  Protein:  93-111 g  Fluid:  1000 mL plus UOP    Molli Barrows, RD, LDN, CNSC Please refer to Amion for contact information.

## 2020-01-16 NOTE — Progress Notes (Signed)
Confirmed with MD Ghimire, S Patient is not comfort care. Will notify next shift RN.

## 2020-01-16 NOTE — Progress Notes (Signed)
eLink Physician-Brief Progress Note Patient Name: Patricia [name] DOB: 10/14/1955 MRN: [id number]   Date of Service  01/16/2020  HPI/Events of Note  NGT removed when patient extubated. Patient is on Protonix per tube.   eICU Interventions  Will order: 1. D/C Protonix per tube.  2. Protonix 40 mg IV now and Q day.      Intervention Category Major Interventions: Other:  Talene Glastetter Cornelia Copa 01/16/2020, 1:17 AM

## 2020-01-16 NOTE — Progress Notes (Signed)
SLP Cancellation Note  Patient Details Name: Patricia [name] MRN: [id number] DOB: [birth year]   Cancelled treatment:        Pt unable to adequately wake for swallow assessment. Will continue to attempt.   Houston Siren 01/16/2020, 10:11 AM  Orbie Pyo Colvin Caroli.Ed Risk analyst [phone] Office [phone]

## 2020-01-16 NOTE — Progress Notes (Signed)
Patient ID: Patricia [name], female   DOB: 1955-12-17, 64 y.o.   MRN: [id number]  [facility] Progress Note  Outpt HD: [facility] TTS    400/800  62kg  Hep 1900  R IJ TDC  - mircera 225 q2wks , last 3/18  - calc tiw  Assessment/ Plan:   1.  Cardiac arrest: out-of-hospital arrest 12/25/2019 at end of HD, CPR 49min, ROSC in ED. Recurrent cardiac arrest on 3/28 PEA. Nothing reversible found on imaging or labs. Pt is DNR, extubated yesterday.    2. Acute hypoxic respiratory failure on vent per critical care  3. Sepsis - septic vs. Cardiogenic, shock resolved, off pressors  4. HTN / volume  - back on low dose regimen; hx labile BP  - vol overloaded w/ diffuse edema  5. Anoxic encephalopathy / brain injury - post arrest; low chance of meaningful recovery per neuro - patient would not want trach/PEG/LTACH per family  - pt is DNR, cont other medical support for now  6. ESRD: usual HD is TTS. Started CRRT on 3/30, dc'd 3/31. Back on iHD now, last HD was Friday off schedule. Pt has orders to move out of ICU. Plan HD today upstairs.   7.  COVID-19 infection: Incidental finding on admit, s/p remdesivir. Pt no longer needs airborne precautions.    Kelly Splinter, MD 01/16/2020, 9:14 AM       Subjective:   Pt not responsive seen in ICU     Objective:   BP (!) 179/76   Pulse 100   Temp 99.4 F (37.4 C) (Axillary)   Resp (!) 22   Ht 5\' 4"  (1.626 m)   Wt 74.5 kg   SpO2 100%   BMI 28.19 kg/m   Physical Exam: General adult female in bed, head tilted up and to the left, not tracking or responding HEENT normocephalic atraumatic  Lungs coarse mechanical breath sounds; vent 30/5 Heart S1S2 no rub Extremities 2-3+ diffuse edema upper extremities, 1-2+ edema of Legs  Neuro - not following commands no sedation running Access right chest tunneled catheter   Labs: BMET Recent Labs  Lab 01/10/20 0430 01/10/20 0430 01/10/20 1744 01/11/20 0359 01/12/20 0449 01/13/20 0500  01/13/20 1303 01/14/20 0359 01/15/20 0344  NA 134*  --  135 138 140 137  --  138 140  K 4.0   < > 3.8 4.3 3.4* 3.1* 3.8 3.6 3.6  CL 99  --  102 104 104 101  --  104 107  CO2 21*  --  22 22 23 24   --  22 23  GLUCOSE 167*  --  306* 227* 173* 259*  --  255* 239*  BUN 36*  --  32* 38* 61* 49*  --  68* 86*  CREATININE 2.29*  --  1.86* 2.48* 3.63* 3.01*  --  3.84* 4.57*  CALCIUM 7.6*  --  7.4* 7.7* 7.8* 7.3*  --  7.5* 7.8*  PHOS 4.1  --  3.7 4.0 4.6 3.4  --  3.7 4.3   < > = values in this interval not displayed.   CBC Recent Labs  Lab 01/11/20 0359 01/12/20 0449 01/13/20 0839 01/14/20 0359  WBC 25.4* 20.9* 19.8* 15.8*  NEUTROABS 22.5*  --   --   --   HGB 8.6* 8.2* 7.8* 7.5*  HCT 25.7* 25.4* 24.3* 23.4*  MCV 90.8 94.8 98.0 97.5  PLT 90* 85* 90* 99*      Medications:    . aspirin  81 mg  Oral Daily  . carvedilol  6.25 mg Oral BID WC  . Chlorhexidine Gluconate Cloth  6 each Topical Daily  . Chlorhexidine Gluconate Cloth  6 each Topical Q0600  . darbepoetin (ARANESP) injection - DIALYSIS  200 mcg Intravenous Q Tue-HD  . mouth rinse  15 mL Mouth Rinse q12n4p  . multivitamin  1 tablet Per Tube QHS  . pantoprazole (PROTONIX) IV  40 mg Intravenous Q0600  . white petrolatum   Topical BID

## 2020-01-16 NOTE — Progress Notes (Addendum)
PROGRESS NOTE        PATIENT DETAILS Name: Patricia [name] Age: 64 y.o. Sex: female Date of Birth: December 13, 1955 Admit Date: 12/18/2019 Admitting Physician Brand Males, MD JME:[id number], Dalbert Batman, MD  Brief Narrative: Patient is a 64 y.o. female with history of ESRD on HD, chronic systolic heart failure, HTN, DM-2-who was admitted to the ICU with out of facility cardiac arrest.  Apparently patient was in her usual state of health-went to outpatient HD on 3/20-where she became nauseated and vomited-while at home she developed diaphoresis and collapsed-family started CPR-called EMS-performed 15 additional minutes of CPR with return of ROSC.  In the ED-patient was nonresponsive-and intubated-and subsequently admitted by PCCM to the ICU.  She underwent hypothermic protocol-but on 2/28-developed hypotension and a PEA arrest.  After extensive discussion with family-patient was extubated (one-way extubation/DNR) on 4/5 and [facility] on 4/6.  Significant events: 3/20 admit to [facility] 3/21 TTM initiated to 33 3/22 completed TTM, rewarmed 3/27: hypertensive prior to HD.  Started hydral, had been on labetelol gtt.  3/28 AM briefly on levophed for hypotension. Became acutely hypotensive in early afternoon, followed by refractory hypotension then cardiac arrest (PEA per nurse).   PEA --> vfib. Defibrillated back to sinus.    4/5 one-way extubation 4/6 transfer to [facility]  Significant studies: 3/20>> CT head-no acute findings 3/21>> EEG-no seizures, generalized background that is slow 3/21>> Echo: EF 30-35% 3/22>> overnight video EEG-no seizures-generalized slowing c/w profound diffuse encephalopathy 3/26>> MRI brain-anoxic brain injury,?acute infarct in the right occipital cortex 3/29>> carotid ultrasound: No significant stenosis  COVID-19 treatment Remdesivir 3/20>> 3/24 Decadron 3/20>> 3/27  Antbiotics: Cefepime 3/20 x1 Flagyl 3/20  x1 Vancomycin 3/20-3/22, 3/28 >>4/1 Zosyn 3/24>>3/28 merrem 3/28-> 4/3  Microbiology data: 3/20 SARS-Cov-2>> positive 3/22 sputum cx: enterobacter cloacae (R cefazolin, otherwise sensitive) 3/20 blood cx: neg 3/31 blood culture: Negative  Procedures : 3/20>>4/5 ET tube  3/20 CVC > 3/24 11/23/19: R IJ tunneled perm cath:   Consults: PCCM Neuro  DVT Prophylaxis : Prophylactic Heparin   Subjective: Awake-does not track my movements-but really does not follow any commands.  Assessment/Plan: Cardiac arrest: Initial out to facility on 3/20-followed by repeat cardiac arrest on 3/28.  Currently stable on telemetry-appears to have significant anoxic injury and metabolic encephalopathy.  Echo with decreased EF-given poor prognosis-doubt further work-up will change management or outcome.  Palliative care following and engaging with family.  See goals of care discussion below after my discussion with palliative care.  Anoxic brain injury: Evaluated by neurology-who do not see a meaningful chance for recovery.  Toxic metabolic encephalopathy: EEG/MRI as above-supportive care continues.  ?CVA: Echo/carotid ultrasound as above-further work-up will will not change management-as she appears to have suffered anoxic injury to the brain.  Dysphagia: Secondary to anoxic brain injury-discussed with palliative care team-who spoke with family earlier this morning-place cortak tube for nutrition-family wants a few more days to see how she progresses-before deciding on PEG tube placement (family so far against PEG tube)   Acute hypoxic respiratory failure in the setting of cardiac arrest: Extubated on 4/5-currently stable on 2-3 L of oxygen.  Chronic systolic heart failure: Volume status stable-diuresis with HD.  Echo as above.  COVID-19 infection: Completed steroids/remdesivir.  Per ID note (on 4/4)-patient is no longer thought to be infectious-airborne/isolation discontinued.  ESRD: Renal  following-dialysis per  nephrology.  HTN: Uncontrolled-not getting Coreg as patient does not have the NG tube.  Change to scheduled IV Lopressor-once patient gets a NG tube in place-resume oral medications.  BP should improve post HD as well.  Anemia: Multifactorial-acute illness-superimposed on ESRD related anemia.  Transfuse if hemoglobin less than 5.  Thrombocytopenia: Appears mild-stable for follow-up.  Aspiration pneumonia: Has completed a course of antibiotics.  Palliative care/goals of care: DNR in place-palliative care NP spoke with family-Per palliative care-plans are to continue supportive care for a few days-to see if patient shows any signs of recovery.  We will go ahead and place NG tube for feedings-plan is to continue dialysis.  Palliative care will continue to engage with family-and delineate further goals of care.  Nutrition Problem: Nutrition Problem: Inadequate oral intake Etiology: acute illness Signs/Symptoms: NPO status Interventions: Tube feeding  Obesity: Estimated body mass index is 28.19 kg/m as calculated from the following:   Height as of this encounter: 5\' 4"  (1.626 m).   Weight as of this encounter: 74.5 kg.   Diet: Diet Order            Diet NPO time specified  Diet effective now               Code Status:  DNR  Family Communication: None at bedside-palliative care just spoke with family this am  Disposition Plan: Await goals of care d/w family over the next few days to determine disposition  Barriers to Discharge: Cardiac arrest x2 with anoxic injury-awaiting further goals of care discussion to determine plan of care and disposition  Antimicrobial agents: Anti-infectives (From admission, onward)   Start     Dose/Rate Route Frequency Ordered Stop   01/11/20 1430  meropenem (MERREM) 500 mg in sodium chloride 0.9 % 100 mL IVPB     500 mg 200 mL/hr over 30 Minutes Intravenous Every 24 hours 01/11/20 0803 01/13/20 1509   01/10/20 2040   vancomycin variable dose per unstable renal function (pharmacist dosing)  Status:  Discontinued      Does not apply See admin instructions 01/10/20 2040 01/11/20 1748   01/10/20 1200  vancomycin (VANCOCIN) IVPB 750 mg/150 ml premix  Status:  Discontinued     750 mg 150 mL/hr over 60 Minutes Intravenous Every 24 hours 01/09/20 1218 01/10/20 2040   01/09/20 1400  meropenem (MERREM) 1 g in sodium chloride 0.9 % 100 mL IVPB  Status:  Discontinued     1 g 200 mL/hr over 30 Minutes Intravenous Every 8 hours 01/09/20 1218 01/10/20 2040   01/09/20 1200  vancomycin (VANCOCIN) IVPB 750 mg/150 ml premix  Status:  Discontinued     750 mg 150 mL/hr over 60 Minutes Intravenous Every T-Th-Sa (Hemodialysis) 01/07/20 1536 01/09/20 1218   01/07/20 1545  vancomycin (VANCOREADY) IVPB 1500 mg/300 mL     1,500 mg 150 mL/hr over 120 Minutes Intravenous  Once 01/07/20 1536 01/07/20 1754   01/07/20 1545  meropenem (MERREM) 500 mg in sodium chloride 0.9 % 100 mL IVPB  Status:  Discontinued     500 mg 200 mL/hr over 30 Minutes Intravenous Daily at bedtime 01/07/20 1536 01/09/20 1218   01/03/20 1000  piperacillin-tazobactam (ZOSYN) IVPB 3.375 g  Status:  Discontinued     3.375 g 12.5 mL/hr over 240 Minutes Intravenous Every 12 hours 01/03/20 0822 01/07/20 1539   01/02/20 1800  ceFEPIme (MAXIPIME) 2 g in sodium chloride 0.9 % 100 mL IVPB  Status:  Discontinued     2  g 200 mL/hr over 30 Minutes Intravenous Every T-Th-Sa (1800) 12/28/2019 2129 01/01/20 0844   01/02/20 1200  vancomycin (VANCOCIN) IVPB 750 mg/150 ml premix  Status:  Discontinued     750 mg 150 mL/hr over 60 Minutes Intravenous Every T-Th-Sa (Hemodialysis) 12/25/2019 2129 01/01/20 0844   12/31/19 1000  remdesivir 100 mg in sodium chloride 0.9 % 100 mL IVPB     100 mg 200 mL/hr over 30 Minutes Intravenous Daily 01/10/2020 2243 01/03/20 0933   12/16/2019 2330  remdesivir 200 mg in sodium chloride 0.9% 250 mL IVPB     200 mg 580 mL/hr over 30 Minutes Intravenous  Once 12/20/2019 2243 12/31/19 0315   01/02/2020 2045  ceFEPIme (MAXIPIME) 2 g in sodium chloride 0.9 % 100 mL IVPB     2 g 200 mL/hr over 30 Minutes Intravenous  Once 12/28/2019 2039 12/25/2019 2215   12/26/2019 2045  metroNIDAZOLE (FLAGYL) IVPB 500 mg     500 mg 100 mL/hr over 60 Minutes Intravenous  Once 12/22/2019 2039 01/05/2020 2256   12/16/2019 2045  vancomycin (VANCOCIN) IVPB 1000 mg/200 mL premix     1,000 mg 200 mL/hr over 60 Minutes Intravenous  Once 01/02/2020 2039 01/03/2020 2215       Time spent: 35 minutes-Greater than 50% of this time was spent in counseling, explanation of diagnosis, planning of further management, and coordination of care.  MEDICATIONS: Scheduled Meds: . aspirin  81 mg Oral Daily  . carvedilol  6.25 mg Oral BID WC  . Chlorhexidine Gluconate Cloth  6 each Topical Daily  . Chlorhexidine Gluconate Cloth  6 each Topical Q0600  . darbepoetin (ARANESP) injection - DIALYSIS  200 mcg Intravenous Q Tue-HD  . mouth rinse  15 mL Mouth Rinse q12n4p  . multivitamin  1 tablet Per Tube QHS  . pantoprazole (PROTONIX) IV  40 mg Intravenous Q0600  . white petrolatum   Topical BID   Continuous Infusions: PRN Meds:.acetaminophen **OR** acetaminophen, diphenhydrAMINE, hydrALAZINE, labetalol, morphine injection, polyvinyl alcohol   PHYSICAL EXAM: Vital signs: Vitals:   01/16/20 0600 01/16/20 0700 01/16/20 0800 01/16/20 0900  BP: (!) 189/80 (!) 170/81 (!) 176/81 (!) 179/76  Pulse: (!) 110 (!) 107 (!) 102 100  Resp: (!) 21 20 (!) 24 (!) 22  Temp:  99.4 F (37.4 C)    TempSrc:  Axillary    SpO2: 100% 100% 100% 100%  Weight:      Height:       Filed Weights   01/12/20 1024 01/14/20 0406 01/16/20 0444  Weight: 77 kg 71.8 kg 74.5 kg   Body mass index is 28.19 kg/m.    Gen Exam:Alert-but not following commands.  Startle reflex positive HEENT:atraumatic, normocephalic Chest: B/L clear to auscultation anteriorly CVS:S1S2 regular Abdomen:soft non tender, non  distended Extremities:no edema Neurology: Difficult exam-not following commands-plantars seem to be upgoing bilaterally.  Does not withdraw to pain. Skin: no rash  I have personally reviewed following labs and imaging studies  LABORATORY DATA: CBC: Recent Labs  Lab 01/10/20 0430 01/11/20 0359 01/12/20 0449 01/13/20 0839 01/14/20 0359  WBC 28.5* 25.4* 20.9* 19.8* 15.8*  NEUTROABS  --  22.5*  --   --   --   HGB 9.5* 8.6* 8.2* 7.8* 7.5*  HCT 27.1* 25.7* 25.4* 24.3* 23.4*  MCV 85.5 90.8 94.8 98.0 97.5  PLT 89* 90* 85* 90* 99*    Basic Metabolic Panel: Recent Labs  Lab 01/11/20 0359 01/11/20 0359 01/12/20 0449 01/13/20 0500 01/13/20 1303 01/14/20 0359 01/15/20 0344  NA 138  --  140 137  --  138 140  K 4.3   < > 3.4* 3.1* 3.8 3.6 3.6  CL 104  --  104 101  --  104 107  CO2 22  --  23 24  --  22 23  GLUCOSE 227*  --  173* 259*  --  255* 239*  BUN 38*  --  61* 49*  --  68* 86*  CREATININE 2.48*  --  3.63* 3.01*  --  3.84* 4.57*  CALCIUM 7.7*  --  7.8* 7.3*  --  7.5* 7.8*  MG 2.2  --  2.4 2.1  --  2.2 2.3  PHOS 4.0  --  4.6 3.4  --  3.7 4.3   < > = values in this interval not displayed.    GFR: Estimated Creatinine Clearance: 12.8 mL/min (A) (by C-G formula based on SCr of 4.57 mg/dL (H)).  Liver Function Tests: Recent Labs  Lab 01/11/20 0359 01/12/20 0449 01/13/20 0500 01/14/20 0359 01/15/20 0344  ALBUMIN 1.2* 1.2* 1.2* 1.1* 1.3*   No results for input(s): LIPASE, AMYLASE in the last 168 hours. No results for input(s): AMMONIA in the last 168 hours.  Coagulation Profile: No results for input(s): INR, PROTIME in the last 168 hours.  Cardiac Enzymes: No results for input(s): CKTOTAL, CKMB, CKMBINDEX, TROPONINI in the last 168 hours.  BNP (last 3 results) No results for input(s): PROBNP in the last 8760 hours.  Lipid Profile: No results for input(s): CHOL, HDL, LDLCALC, TRIG, CHOLHDL, LDLDIRECT in the last 72 hours.  Thyroid Function Tests: No results  for input(s): TSH, T4TOTAL, FREET4, T3FREE, THYROIDAB in the last 72 hours.  Anemia Panel: No results for input(s): VITAMINB12, FOLATE, FERRITIN, TIBC, IRON, RETICCTPCT in the last 72 hours.  Urine analysis:    Component Value Date/Time   COLORURINE AMBER (A) 12/11/2019 2122   APPEARANCEUR CLOUDY (A) 01/09/2020 2122   LABSPEC 1.019 12/31/2019 2122   PHURINE 7.0 12/21/2019 2122   GLUCOSEU 150 (A) 12/22/2019 2122   HGBUR NEGATIVE 01/05/2020 2122   BILIRUBINUR NEGATIVE 12/16/2019 2122   BILIRUBINUR 1.0 10/09/2016 1306   KETONESUR NEGATIVE 01/02/2020 2122   PROTEINUR >=300 (A) 12/14/2019 2122   UROBILINOGEN 0.2 04/24/2018 1529   NITRITE NEGATIVE 12/24/2019 2122   LEUKOCYTESUR NEGATIVE 12/25/2019 2122    Sepsis Labs: Lactic Acid, Venous    Component Value Date/Time   LATICACIDVEN 1.6 01/10/2020 2243    MICROBIOLOGY: Recent Results (from the past 240 hour(s))  Culture, blood (routine x 2)     Status: None   Collection Time: 01/10/20  9:44 AM   Specimen: BLOOD  Result Value Ref Range Status   Specimen Description BLOOD LEFT FOOT  Final   Special Requests   Final    BOTTLES DRAWN AEROBIC AND ANAEROBIC Blood Culture adequate volume   Culture   Final    NO GROWTH 5 DAYS Performed at [facility], [place] [address] [place], [place] [address]    Report Status 01/15/2020 FINAL  Final  Culture, blood (routine x 2)     Status: None   Collection Time: 01/10/20  9:51 AM   Specimen: BLOOD  Result Value Ref Range Status   Specimen Description BLOOD LEFT FOOT  Final   Special Requests   Final    BOTTLES DRAWN AEROBIC AND ANAEROBIC Blood Culture adequate volume   Culture   Final    NO GROWTH 5 DAYS Performed at [facility], [address] [place], [place] [address]    Report Status 01/15/2020 FINAL  Final  MRSA PCR Screening     Status: None   Collection Time: 01/11/20  9:53 AM   Specimen: Nasal Mucosa; Nasopharyngeal  Result Value Ref Range Status   MRSA by PCR  NEGATIVE NEGATIVE Final    Comment:        The GeneXpert MRSA Assay (FDA approved for NASAL specimens only), is one component of a comprehensive MRSA colonization surveillance program. It is not intended to diagnose MRSA infection nor to guide or monitor treatment for MRSA infections. Performed at [facility], [place] [address], [place], [place] [address]   Respiratory Panel by RT PCR (Flu A&B, Covid) - Nasopharyngeal Swab     Status: None   Collection Time: 01/14/20  9:38 AM   Specimen: Nasopharyngeal Swab  Result Value Ref Range Status   SARS Coronavirus 2 by RT PCR NEGATIVE NEGATIVE Final    Comment: (NOTE) SARS-CoV-2 target nucleic acids are NOT DETECTED. The SARS-CoV-2 RNA is generally detectable in upper respiratoy specimens during the acute phase of infection. The lowest concentration of SARS-CoV-2 viral copies this assay can detect is 131 copies/mL. A negative result does not preclude SARS-Cov-2 infection and should not be used as the sole basis for treatment or other patient management decisions. A negative result may occur with  improper specimen collection/handling, submission of specimen other than nasopharyngeal swab, presence of viral mutation(s) within the areas targeted by this assay, and inadequate number of viral copies (<131 copies/mL). A negative result must be combined with clinical observations, patient history, and epidemiological information. The expected result is Negative. Fact Sheet for Patients:  PinkCheek.be Fact Sheet for Healthcare Providers:  GravelBags.it This test is not yet ap proved or cleared by the [place] FDA and  has been authorized for detection and/or diagnosis of SARS-CoV-2 by FDA under an Emergency Use Authorization (EUA). This EUA will remain  in effect (meaning this test can be used) for the duration of the COVID-19 declaration under Section 564(b)(1) of the Act, 21  U.S.C. section 360bbb-3(b)(1), unless the authorization is terminated or revoked sooner.    Influenza A by PCR NEGATIVE NEGATIVE Final   Influenza B by PCR NEGATIVE NEGATIVE Final    Comment: (NOTE) The Xpert Xpress SARS-CoV-2/FLU/RSV assay is intended as an aid in  the diagnosis of influenza from Nasopharyngeal swab specimens and  should not be used as a sole basis for treatment. Nasal washings and  aspirates are unacceptable for Xpert Xpress SARS-CoV-2/FLU/RSV  testing. Fact Sheet for Patients: PinkCheek.be Fact Sheet for Healthcare Providers: GravelBags.it This test is not yet approved or cleared by the [place] FDA and  has been authorized for detection and/or diagnosis of SARS-CoV-2 by  FDA under an Emergency Use Authorization (EUA). This EUA will remain  in effect (meaning this test can be used) for the duration of the  Covid-19 declaration under Section 564(b)(1) of the Act, 21  U.S.C. section 360bbb-3(b)(1), unless the authorization is  terminated or revoked. Performed at [facility], [place] [address], [place],  [address]     RADIOLOGY STUDIES/RESULTS: No results found.   LOS: 17 days   Oren Binet, MD  [facility]    To contact the attending provider between 7A-7P or the covering provider during after hours 7P-7A, please log into the web site [email or web address] and access using universal [place] [employer] password for that web site. If you do not have the  password, please call the hospital operator.  01/16/2020, 9:55 AM

## 2020-01-16 NOTE — Progress Notes (Signed)
Patient ID: Patricia [name], female   DOB: 1956/06/15, 64 y.o.   MRN: [id number]  This NP visited patient at the bedside as a follow up for palliative medicine needs and emotional support.  Patient with a past medical history significant for end-stage renal disease/on hemodialysis, chronic systolic heart failure, hypertension, diabetes mellitus who was admitted on 12/18/2019 with an out of facility cardiac arrest.  Family initiated CPR, EMS performed 15 minutes of CPR with return of ROSC.  Patient was intubated in the emergency room, she under went hypothermic protocol but unfortunately on 01-07-27-21 developed hypotension and had a PEA arrest. 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  Decision was made with family for a one-way extubation on 01-15-20.   I spoke to the patient's daughters today by telephone.  They tell me that they were under the impression that the patient would only survive for a very short period of time once extubated.    Although the patient's eyes are open there is no tracking.  She has gaze to the left.  She is nonverbal and unable to follow commands. Family see her awakeness as a positive sign.  They tell me that "she is a Nurse, adult" and they remain hopeful for improvement. "  We are placing our trust in God"  I discussed in detail the difference between an aggressive medical intervention path and a palliative comfort path for this patient at this time in this situation.  Discussed that outside of all other medical conditions patients  with end-stage renal disease have a likely prognosis of less than 2 weeks without dialysis  Plan of care         Both daughters wish to continue with life prolonging measures, they hope for improvement.  We discussed the importance of continued reassessment and making decisions based on the patient's outcomes over the next 24 to 48 hours (daughters understand that quality of life was important to their mother and she  would not want to be placed long-term in the SNF)  -DNR/DNI (previously documented) -Continue artificial feeding and hydration (place core track) -Continue dialysis -Treat the treatable  Discussed with  the importance of continued conversation with each other and the medical providers regarding overall plan of care and treatment options,  ensuring decisions are within the context of the patients values and GOCs.  Questions and concerns addressed   Discussed with Dr Sloan Leiter and Dr. Jonnie Finner and bedside RN  PMT team will continue to support holistically and family encouraged to call with questions or concerns  Total time spent on the unit was 40 minutes  Greater than 50% of the time was spent in counseling and coordination of care  Wadie Lessen NP  Palliative Medicine Team Team Phone # [phone] Pager [phone]

## 2020-01-17 DIAGNOSIS — Z789 Other specified health status: Secondary | ICD-10-CM | POA: Diagnosis not present

## 2020-01-17 DIAGNOSIS — Z66 Do not resuscitate: Secondary | ICD-10-CM | POA: Diagnosis not present

## 2020-01-17 LAB — CBC
HCT: 25.3 % — ABNORMAL LOW (ref 36.0–46.0)
HCT: 23.8 % — ABNORMAL LOW (ref 36.0–46.0)
Hemoglobin: 8 g/dL — ABNORMAL LOW (ref 12.0–15.0)
Hemoglobin: 7.5 g/dL — ABNORMAL LOW (ref 12.0–15.0)
MCH: 30.7 pg (ref 26.0–34.0)
MCH: 30.2 pg (ref 26.0–34.0)
MCHC: 31.6 g/dL (ref 30.0–36.0)
MCHC: 31.5 g/dL (ref 30.0–36.0)
MCV: 96.9 fL (ref 80.0–100.0)
MCV: 96 fL (ref 80.0–100.0)
Platelets: 185 10*3/uL (ref 150–400)
Platelets: 173 10*3/uL (ref 150–400)
RBC: 2.61 MIL/uL — ABNORMAL LOW (ref 3.87–5.11)
RBC: 2.48 MIL/uL — ABNORMAL LOW (ref 3.87–5.11)
RDW: 18.1 % — ABNORMAL HIGH (ref 11.5–15.5)
RDW: 17.8 % — ABNORMAL HIGH (ref 11.5–15.5)
WBC: 17.8 10*3/uL — ABNORMAL HIGH (ref 4.0–10.5)
WBC: 15.1 10*3/uL — ABNORMAL HIGH (ref 4.0–10.5)
nRBC: 0 % (ref 0.0–0.2)
nRBC: 0.1 % (ref 0.0–0.2)

## 2020-01-17 LAB — RENAL FUNCTION PANEL
Albumin: 1.4 g/dL — ABNORMAL LOW (ref 3.5–5.0)
Anion gap: 16 — ABNORMAL HIGH (ref 5–15)
BUN: 50 mg/dL — ABNORMAL HIGH (ref 8–23)
CO2: 21 mmol/L — ABNORMAL LOW (ref 22–32)
Calcium: 7.7 mg/dL — ABNORMAL LOW (ref 8.9–10.3)
Chloride: 100 mmol/L (ref 98–111)
Creatinine, Ser: 3.46 mg/dL — ABNORMAL HIGH (ref 0.44–1.00)
GFR calc Af Amer: 16 mL/min — ABNORMAL LOW (ref >60)
GFR calc non Af Amer: 14 mL/min — ABNORMAL LOW (ref >60)
Glucose, Bld: 174 mg/dL — ABNORMAL HIGH (ref 70–99)
Phosphorus: 4.4 mg/dL (ref 2.5–4.6)
Potassium: 4.1 mmol/L (ref 3.5–5.1)
Sodium: 137 mmol/L (ref 135–145)

## 2020-01-17 LAB — GLUCOSE, CAPILLARY
Glucose-Capillary: 136 mg/dL — ABNORMAL HIGH (ref 70–99)
Glucose-Capillary: 168 mg/dL — ABNORMAL HIGH (ref 70–99)
Glucose-Capillary: 160 mg/dL — ABNORMAL HIGH (ref 70–99)
Glucose-Capillary: 202 mg/dL — ABNORMAL HIGH (ref 70–99)
Glucose-Capillary: 237 mg/dL — ABNORMAL HIGH (ref 70–99)
Glucose-Capillary: 285 mg/dL — ABNORMAL HIGH (ref 70–99)

## 2020-01-17 MED ORDER — CHLORHEXIDINE GLUCONATE CLOTH 2 % EX PADS
6.0000 | MEDICATED_PAD | Freq: Every day | CUTANEOUS | Status: DC
  Administered 2020-01-17 – 2020-01-18 (×2): 6 via TOPICAL

## 2020-01-17 MED ORDER — DARBEPOETIN ALFA 200 MCG/0.4ML IJ SOSY
200.0000 ug | PREFILLED_SYRINGE | INTRAMUSCULAR | Status: DC
  Filled 2020-01-17: qty 0.4

## 2020-01-17 MED ORDER — INSULIN ASPART 100 UNIT/ML ~~LOC~~ SOLN
0.0000 [IU] | SUBCUTANEOUS | Status: DC
  Administered 2020-01-18: 2 [IU] via SUBCUTANEOUS
  Administered 2020-01-18: 4 [IU] via SUBCUTANEOUS
  Administered 2020-01-18: 5 [IU] via SUBCUTANEOUS
  Administered 2020-01-18: 3 [IU] via SUBCUTANEOUS
  Administered 2020-01-18: 5 [IU] via SUBCUTANEOUS
  Administered 2020-01-19: 4 [IU] via SUBCUTANEOUS
  Administered 2020-01-19: 1 [IU] via SUBCUTANEOUS
  Administered 2020-01-19: 5 [IU] via SUBCUTANEOUS
  Administered 2020-01-19: 2 [IU] via SUBCUTANEOUS
  Administered 2020-01-19: 1 [IU] via SUBCUTANEOUS
  Administered 2020-01-19 – 2020-01-20 (×3): 2 [IU] via SUBCUTANEOUS
  Administered 2020-01-20 (×2): 3 [IU] via SUBCUTANEOUS
  Administered 2020-01-20 (×2): 1 [IU] via SUBCUTANEOUS
  Administered 2020-01-20 – 2020-01-21 (×3): 2 [IU] via SUBCUTANEOUS
  Administered 2020-01-21 – 2020-01-22 (×3): 1 [IU] via SUBCUTANEOUS
  Administered 2020-01-22: 2 [IU] via SUBCUTANEOUS
  Administered 2020-01-23 (×2): 1 [IU] via SUBCUTANEOUS

## 2020-01-17 MED ORDER — NEPRO/CARBSTEADY PO LIQD
1000.0000 mL | ORAL | Status: DC
  Administered 2020-01-17 – 2020-01-20 (×3): 1000 mL
  Filled 2020-01-17 (×8): qty 1000

## 2020-01-17 MED ORDER — HEPARIN SODIUM (PORCINE) 1000 UNIT/ML IJ SOLN
INTRAMUSCULAR | Status: AC
  Administered 2020-01-18: 2000 [IU] via INTRAVENOUS
  Filled 2020-01-17: qty 4

## 2020-01-17 NOTE — Progress Notes (Signed)
Pt two daughters are at bedside and are wanting to speak with someone about the care given to their mother. Stated that they are aware that their mom is a DNR/DNI but still want her to be treated like a human being. They were upset about a wound on their moms lips and first time founding out about a pressure wound on the sacrum. Family asked this nurse to do q2 hour position change. I spoke with them and I would try my best to do 2-3 hours. Family given reassurance that we are doing everything we can. 2 Reunion Charlann Boxer is on the floor and will speak with family.

## 2020-01-17 NOTE — Plan of Care (Signed)
  Problem: Cardiac: Goal: Ability to achieve and maintain adequate cardiopulmonary perfusion will improve Outcome: Progressing   Problem: Health Behavior/Discharge Planning: Goal: Ability to manage health-related needs will improve Outcome: Progressing   Problem: Clinical Measurements: Goal: Ability to maintain clinical measurements within normal limits will improve Outcome: Progressing Goal: Will remain free from infection Outcome: Progressing Goal: Diagnostic test results will improve Outcome: Progressing

## 2020-01-17 NOTE — Progress Notes (Signed)
PROGRESS NOTE    SHARESA [name]  HMC:[id number] DOB: 11/25/1955 DOA: 12/18/2019 PCP: Ladell Pier, MD    Brief Narrative:  [phone] with hx ESRD on HD, chronic systolic CHF, HTN, DM2 who was admitted for cardiac arrest. Pt was intubated in the ED and admitted to [facility] service. Pt underwent hypothermic protocol and later developed hypotension with PEA arrest. After family meeting, decision was made for one-way extubation on 4/5 with transfer to [facility]:   Active Problems:   Cardiac arrest [facility])   Palliative care by specialist   DNR (do not resuscitate)   Endotracheally intubated   Anoxic brain damage ([place])   Pressure injury of skin   Dysphagia  Cardiac arrest: Initial out to facility on 3/20-followed by repeat cardiac arrest on 3/28.  Concerns of anoxic brain injury resulting from cardiac arrest as well as metabolic encephalopathy.  Echo with decreased EF-given poor prognosis-doubt further work-up will change management or outcome.  Palliative care following and currently engaging with family.   Anoxic brain injury: Had been seen by neurology-who do not see a meaningful chance for recovery.  Toxic metabolic encephalopathy: EEG/MRI as above-supportive care continues.  ?CVA: Echo/carotid ultrasound performed. Per Neurology, more concerns of hypoxic/anoxic brain injury with stroke likely resultant of either small vessel disease or embolic infarct in the setting of cardiac arrest. Neurology recommends discussing with family regarding the possibility of an irreversible brain injury and grim chances for neurologically meaningful recovery  Dysphagia: Secondary to anoxic brain injury-Dr. Sloan Leiter discussed with palliative care team-who spoke with family --place cortak tube for nutrition-family wants a few more days to see how she progresses-before deciding on PEG tube placement (family so far against PEG tube)   Acute hypoxic respiratory failure in the setting of cardiac  arrest: Extubated on 4/5-currently stable on 2-3 L of oxygen.  Chronic systolic heart failure: Volume status stable-diuresis with HD.  Echo as above.  COVID-19 infection: Completed steroids/remdesivir.  Per ID note (on 4/4)-patient is no longer thought to be infectious-airborne/isolation discontinued.  ESRD: Renal following-dialysis per nephrology. On HD as tolerated  HTN: Uncontrolled-not getting Coreg as patient does not have the NG tube.  On scheduled IV Lopressorl.  Anemia: Multifactorial-acute illness-superimposed on ESRD related anemia.  Transfuse if hemoglobin less than 5.  Acute blood loss anemia: blood noted in stools this AM. Hgb this AM is up to 8.0 from prior value of 7.5. Will hold ASA and heparin subq. Will repeat CBC this afternoon  Thrombocytopenia: Appears improved from prior  Aspiration pneumonia: Has completed a course of antibiotics.  Palliative care/goals of care: DNR in place-palliative care NP spoke with family-Per palliative care-plans are to continue supportive care for a few days-to see if patient shows any signs of recovery.  We will go ahead and place NG tube for feedings-plan is to continue dialysis.  Palliative care will continue to engage with family-and delineate further goals of care.  Nutrition Problem: Nutrition Problem: Inadequate oral intake Etiology: acute illness Signs/Symptoms: NPO status Interventions: Tube feeding as toleraetd  Obesity: Estimated body mass index is 28.19 kg/m as calculated from the following:   Height as of this encounter: 5\' 4"  (1.626 m).   Weight as of this encounter: 74.5 kg.   DVT prophylaxis: SCD's, hold heparin Code Status: DNR Family Communication: Pt in room, family not at bedside Disposition Plan: From home, dispo pending f/u discussion with Palliative care  Consultants:   PCCM   Neurology  Palliative Care  Nephrology  Procedures:  3/20>>4/5 ET tube 3/20 CVC > 3/24 11/23/19: R IJ tunneled  perm cath:   Antimicrobials: Anti-infectives (From admission, onward)   Start     Dose/Rate Route Frequency Ordered Stop   01/11/20 1430  meropenem (MERREM) 500 mg in sodium chloride 0.9 % 100 mL IVPB     500 mg 200 mL/hr over 30 Minutes Intravenous Every 24 hours 01/11/20 0803 01/13/20 1509   01/10/20 2040  vancomycin variable dose per unstable renal function (pharmacist dosing)  Status:  Discontinued      Does not apply See admin instructions 01/10/20 2040 01/11/20 1748   01/10/20 1200  vancomycin (VANCOCIN) IVPB 750 mg/150 ml premix  Status:  Discontinued     750 mg 150 mL/hr over 60 Minutes Intravenous Every 24 hours 01/09/20 1218 01/10/20 2040   01/09/20 1400  meropenem (MERREM) 1 g in sodium chloride 0.9 % 100 mL IVPB  Status:  Discontinued     1 g 200 mL/hr over 30 Minutes Intravenous Every 8 hours 01/09/20 1218 01/10/20 2040   01/09/20 1200  vancomycin (VANCOCIN) IVPB 750 mg/150 ml premix  Status:  Discontinued     750 mg 150 mL/hr over 60 Minutes Intravenous Every T-Th-[facility]ricia (Hemodialysis) 01/07/20 1536 01/09/20 1218   01/07/20 1545  vancomycin (VANCOREADY) IVPB 1500 mg/300 mL     1,500 mg 150 mL/hr over 120 Minutes Intravenous  Once 01/07/20 1536 01/07/20 1754   01/07/20 1545  meropenem (MERREM) 500 mg in sodium chloride 0.9 % 100 mL IVPB  Status:  Discontinued     500 mg 200 mL/hr over 30 Minutes Intravenous Daily at bedtime 01/07/20 1536 01/09/20 1218   01/03/20 1000  piperacillin-tazobactam (ZOSYN) IVPB 3.375 g  Status:  Discontinued     3.375 g 12.5 mL/hr over 240 Minutes Intravenous Every 12 hours 01/03/20 0822 01/07/20 1539   01/02/20 1800  ceFEPIme (MAXIPIME) 2 g in sodium chloride 0.9 % 100 mL IVPB  Status:  Discontinued     2 g 200 mL/hr over 30 Minutes Intravenous Every T-Th-Patricia (1800) 12/29/2019 2129 01/01/20 0844   01/02/20 1200  vancomycin (VANCOCIN) IVPB 750 mg/150 ml premix  Status:  Discontinued     750 mg 150 mL/hr over 60 Minutes Intravenous Every T-Th-[facility]ricia  (Hemodialysis) 12/29/2019 2129 01/01/20 0844   12/31/19 1000  remdesivir 100 mg in sodium chloride 0.9 % 100 mL IVPB     100 mg 200 mL/hr over 30 Minutes Intravenous Daily 12/17/2019 2243 01/03/20 0933   12/20/2019 2330  remdesivir 200 mg in sodium chloride 0.9% 250 mL IVPB     200 mg 580 mL/hr over 30 Minutes Intravenous Once 12/22/2019 2243 12/31/19 0315   12/27/2019 2045  ceFEPIme (MAXIPIME) 2 g in sodium chloride 0.9 % 100 mL IVPB     2 g 200 mL/hr over 30 Minutes Intravenous  Once 01/05/2020 2039 12/20/2019 2215   12/31/2019 2045  metroNIDAZOLE (FLAGYL) IVPB 500 mg     500 mg 100 mL/hr over 60 Minutes Intravenous  Once 01/01/2020 2039 01/07/2020 2256   12/29/2019 2045  vancomycin (VANCOCIN) IVPB 1000 mg/200 mL premix     1,000 mg 200 mL/hr over 60 Minutes Intravenous  Once 12/31/2019 2039 01/08/2020 2215       Subjective: Unable to assess given nonverbal state  Objective: Vitals:   01/17/20 0424 01/17/20 0616 01/17/20 0838 01/17/20 1228  BP: (!) 145/62 (!) 159/72 (!) 147/87 (!) 175/72  Pulse: 94 (!) 101 100   Resp:   18 20  Temp:   98.9 F (37.2 C)   TempSrc:   Axillary   SpO2: 100%  100% 100%  Weight:      Height:        Intake/Output Summary (Last 24 hours) at 01/17/2020 1433 Last data filed at 01/17/2020 0328 Gross per 24 hour  Intake 0 ml  Output 1437 ml  Net -1437 ml   Filed Weights   01/12/20 1024 01/14/20 0406 01/16/20 0444  Weight: 77 kg 71.8 kg 74.5 kg    Examination:  General exam: Appears calm and comfortable  Respiratory system: Clear to auscultation. Respiratory effort normal. Cardiovascular system: S1 & S2 heard, Regular Gastrointestinal system: Abdomen is nondistended, soft  Pos bs Central nervous system: Alert. No focal neurological deficits, moves B UE Extremities: Symmetric 5 x 5 power. Skin: normal skin turgor, no pallor Psychiatry: unable to assess given nonverbal state  Data Reviewed: I have personally reviewed following labs and imaging studies  CBC: Recent  Labs  Lab 01/11/20 0359 01/12/20 0449 01/13/20 0839 01/14/20 0359 01/17/20 0500  WBC 25.4* 20.9* 19.8* 15.8* 17.8*  NEUTROABS 22.5*  --   --   --   --   HGB 8.6* 8.2* 7.8* 7.5* 8.0*  HCT 25.7* 25.4* 24.3* 23.4* 25.3*  MCV 90.8 94.8 98.0 97.5 96.9  PLT 90* 85* 90* 99* 233   Basic Metabolic Panel: Recent Labs  Lab 01/11/20 0359 01/11/20 0359 01/12/20 0449 01/12/20 0449 01/13/20 0500 01/13/20 1303 01/14/20 0359 01/15/20 0344 01/17/20 0500  NA 138   < > 140  --  137  --  138 140 137  K 4.3   < > 3.4*   < > 3.1* 3.8 3.6 3.6 4.1  CL 104   < > 104  --  101  --  104 107 100  CO2 22   < > 23  --  24  --  22 23 21*  GLUCOSE 227*   < > 173*  --  259*  --  255* 239* 174*  BUN 38*   < > 61*  --  49*  --  68* 86* 50*  CREATININE 2.48*   < > 3.63*  --  3.01*  --  3.84* 4.57* 3.46*  CALCIUM 7.7*   < > 7.8*  --  7.3*  --  7.5* 7.8* 7.7*  MG 2.2  --  2.4  --  2.1  --  2.2 2.3  --   PHOS 4.0   < > 4.6  --  3.4  --  3.7 4.3 4.4   < > = values in this interval not displayed.   GFR: Estimated Creatinine Clearance: 16.9 mL/min (A) (by C-G formula based on SCr of 3.46 mg/dL (H)). Liver Function Tests: Recent Labs  Lab 01/12/20 0449 01/13/20 0500 01/14/20 0359 01/15/20 0344 01/17/20 0500  ALBUMIN 1.2* 1.2* 1.1* 1.3* 1.4*   No results for input(s): LIPASE, AMYLASE in the last 168 hours. No results for input(s): AMMONIA in the last 168 hours. Coagulation Profile: No results for input(s): INR, PROTIME in the last 168 hours. Cardiac Enzymes: No results for input(s): CKTOTAL, CKMB, CKMBINDEX, TROPONINI in the last 168 hours. BNP (last 3 results) No results for input(s): PROBNP in the last 8760 hours. HbA1C: No results for input(s): HGBA1C in the last 72 hours. CBG: Recent Labs  Lab 01/16/20 2019 01/16/20 2339 01/17/20 0503 01/17/20 0842 01/17/20 1125  GLUCAP 217* 191* 136* 168* 160*   Lipid Profile: No results for input(s): CHOL, HDL, LDLCALC,  TRIG, CHOLHDL, LDLDIRECT in the  last 72 hours. Thyroid Function Tests: No results for input(s): TSH, T4TOTAL, FREET4, T3FREE, THYROIDAB in the last 72 hours. Anemia Panel: No results for input(s): VITAMINB12, FOLATE, FERRITIN, TIBC, IRON, RETICCTPCT in the last 72 hours. Sepsis Labs: Recent Labs  Lab 01/11/20 0359  PROCALCITON 3.23    Recent Results (from the past 240 hour(s))  Culture, blood (routine x 2)     Status: None   Collection Time: 01/10/20  9:44 AM   Specimen: BLOOD  Result Value Ref Range Status   Specimen Description BLOOD LEFT FOOT  Final   Special Requests   Final    BOTTLES DRAWN AEROBIC AND ANAEROBIC Blood Culture adequate volume   Culture   Final    NO GROWTH 5 DAYS Performed at [facility], [address], [place], [place] [address]    Report Status 01/15/2020 FINAL  Final  Culture, blood (routine x 2)     Status: None   Collection Time: 01/10/20  9:51 AM   Specimen: BLOOD  Result Value Ref Range Status   Specimen Description BLOOD LEFT FOOT  Final   Special Requests   Final    BOTTLES DRAWN AEROBIC AND ANAEROBIC Blood Culture adequate volume   Culture   Final    NO GROWTH 5 DAYS Performed at [facility], [place] [address] [place], [place] [address]    Report Status 01/15/2020 FINAL  Final  MRSA PCR Screening     Status: None   Collection Time: 01/11/20  9:53 AM   Specimen: Nasal Mucosa; Nasopharyngeal  Result Value Ref Range Status   MRSA by PCR NEGATIVE NEGATIVE Final    Comment:        The GeneXpert MRSA Assay (FDA approved for NASAL specimens only), is one component of a comprehensive MRSA colonization surveillance program. It is not intended to diagnose MRSA infection nor to guide or monitor treatment for MRSA infections. Performed at [facility], [place] [address] [place], [place] [address]   Respiratory Panel by RT PCR (Flu A&B, Covid) - Nasopharyngeal Swab     Status: None   Collection Time: 01/14/20  9:38 AM   Specimen: Nasopharyngeal Swab  Result  Value Ref Range Status   SARS Coronavirus 2 by RT PCR NEGATIVE NEGATIVE Final    Comment: (NOTE) SARS-CoV-2 target nucleic acids are NOT DETECTED. The SARS-CoV-2 RNA is generally detectable in upper respiratoy specimens during the acute phase of infection. The lowest concentration of SARS-CoV-2 viral copies this assay can detect is 131 copies/mL. A negative result does not preclude SARS-Cov-2 infection and should not be used as the sole basis for treatment or other patient management decisions. A negative result may occur with  improper specimen collection/handling, submission of specimen other than nasopharyngeal swab, presence of viral mutation(s) within the areas targeted by this assay, and inadequate number of viral copies (<131 copies/mL). A negative result must be combined with clinical observations, patient history, and epidemiological information. The expected result is Negative. Fact Sheet for Patients:  PinkCheek.be Fact Sheet for Healthcare Providers:  GravelBags.it This test is not yet ap proved or cleared by the [place] FDA and  has been authorized for detection and/or diagnosis of SARS-CoV-2 by FDA under an Emergency Use Authorization (EUA). This EUA will remain  in effect (meaning this test can be used) for the duration of the COVID-19 declaration under Section 564(b)(1) of the Act, 21 U.S.C. section 360bbb-3(b)(1), unless the authorization is  terminated or revoked sooner.    Influenza A by PCR NEGATIVE NEGATIVE Final   Influenza B by PCR NEGATIVE NEGATIVE Final    Comment: (NOTE) The Xpert Xpress SARS-CoV-2/FLU/RSV assay is intended as an aid in  the diagnosis of influenza from Nasopharyngeal swab specimens and  should not be used as a sole basis for treatment. Nasal washings and  aspirates are unacceptable for Xpert Xpress SARS-CoV-2/FLU/RSV  testing. Fact Sheet for  Patients: PinkCheek.be Fact Sheet for Healthcare Providers: GravelBags.it This test is not yet approved or cleared by the [place] FDA and  has been authorized for detection and/or diagnosis of SARS-CoV-2 by  FDA under an Emergency Use Authorization (EUA). This EUA will remain  in effect (meaning this test can be used) for the duration of the  Covid-19 declaration under Section 564(b)(1) of the Act, 21  U.S.C. section 360bbb-3(b)(1), unless the authorization is  terminated or revoked. Performed at [facility], [place] [address], [place], [place] [address]      Radiology Studies: No results found.  Scheduled Meds: . Chlorhexidine Gluconate Cloth  6 each Topical Daily  . Chlorhexidine Gluconate Cloth  6 each Topical Q0600  . Chlorhexidine Gluconate Cloth  6 each Topical Q0600  . [START ON 01/18/2020] darbepoetin (ARANESP) injection - DIALYSIS  200 mcg Intravenous Q Thu-HD  . heparin      . mouth rinse  15 mL Mouth Rinse q12n4p  . metoprolol tartrate  5 mg Intravenous Q6H  . multivitamin  1 tablet Per Tube QHS  . pantoprazole (PROTONIX) IV  40 mg Intravenous Q0600  . white petrolatum   Topical BID   Continuous Infusions: . feeding supplement (NEPRO CARB STEADY) 1,000 mL (01/17/20 1236)     LOS: 18 days   Marylu Lund, MD [facility] Pager On Amion  If 7PM-7AM, please contact night-coverage 01/17/2020, 2:33 PM

## 2020-01-17 NOTE — Progress Notes (Signed)
  Speech Language Pathology  Patient Details Name: Patricia [name] MRN: [id number] DOB: 04/19/1956 Today's Date: 01/17/2020 Time:  -              Notes read from today re: nutrition. Discussions underway with family/Palliative and MD. Family would like to hold off on PEG to see how pt does. She will get Cortrak. SLP will plan to see pt tomorrow for ability to consume po's.   Houston Siren 01/17/2020, 3:23 PM  Orbie Pyo Colvin Caroli.Ed Risk analyst [phone] Office [phone]

## 2020-01-17 NOTE — Plan of Care (Signed)
  Problem: Cardiac: Goal: Ability to achieve and maintain adequate cardiopulmonary perfusion will improve Outcome: Progressing   Problem: Neurologic: Goal: Promote progressive neurologic recovery Outcome: Progressing   Problem: Skin Integrity: Goal: Risk for impaired skin integrity will be minimized. Outcome: Progressing   Problem: Education: Goal: Knowledge of General Education information will improve Description: Including pain rating scale, medication(s)/side effects and non-pharmacologic comfort measures Outcome: Progressing   Problem: Health Behavior/Discharge Planning: Goal: Ability to manage health-related needs will improve Outcome: Progressing   Problem: Clinical Measurements: Goal: Ability to maintain clinical measurements within normal limits will improve Outcome: Progressing Goal: Will remain free from infection Outcome: Progressing Goal: Diagnostic test results will improve Outcome: Progressing   Problem: Activity: Goal: Risk for activity intolerance will decrease Outcome: Progressing   Problem: Nutrition: Goal: Adequate nutrition will be maintained Outcome: Progressing   Problem: Coping: Goal: Level of anxiety will decrease Outcome: Progressing   Problem: Pain Managment: Goal: General experience of comfort will improve Outcome: Progressing   Problem: Safety: Goal: Ability to remain free from injury will improve Outcome: Progressing   Problem: Skin Integrity: Goal: Risk for impaired skin integrity will decrease Outcome: Progressing

## 2020-01-17 NOTE — Progress Notes (Addendum)
Patient ID: Patricia [name], female   DOB: 1955-12-04, 64 y.o.   MRN: [id number]  [facility] Progress Note  Outpt HD: [facility] TTS    400/800  62kg  Hep 1900  R IJ TDC  - mircera 225 q2wks , last 3/18  - calc tiw  Assessment/ Plan:   1.  Cardiac arrest: out-of-hospital arrest 12/19/2019 at end of HD, CPR 45min, ROSC in ED. Recurrent cardiac arrest on 3/28 PEA. Nothing reversible found on imaging or labs. Pt is DNR, extubated 4/5 and breathing on her own. Not responsive due to ABI.   2. Acute hypoxic respiratory failure - resolved  3. Sepsis - septic vs. Cardiogenic, shock resolved, off pressors  4. HTN / volume  - back on low dose regimen; hx labile BP  - does have 4 ext edema c/w vol excess, no resp issues, try to get vol down w/ HD  5. Anoxic encephalopathy / brain injury - post arrest; low chance of meaningful recovery per neuro - patient would not want trach/PEG/LTACH per family  - pt is DNR, cont other medical support for now - per pall care notes >  family values QOL for their mother and they would not want SNF placement  6. ESRD: usual HD is TTS. SP CRRT 3/30 - 3/31. HD today (rolled over from yest d/t high census), than HD Thursday to get back on schedule.   7.  COVID-19 infection: Incidental finding on admit, s/p remdesivir. Pt no longer needs airborne precautions.    Kelly Splinter, MD 01/17/2020, 11:30 AM       Subjective:   Pt not responsive seen in ICU     Objective:   BP (!) 147/87 (BP Location: Left Arm)   Pulse 100   Temp 98.9 F (37.2 C)   Resp 18   Ht 5\' 4"  (1.626 m)   Wt 74.5 kg   SpO2 100%   BMI 28.19 kg/m   Physical Exam: General adult female in bed, head tilted up and to the left, not tracking or responding HEENT normocephalic atraumatic  Lungs coarse mechanical breath sounds; vent 30/5 Heart S1S2 no rub Extremities 2+ edema x 4 ext Neuro - not following commands no sedation running Access right chest tunneled catheter    Labs: BMET Recent Labs  Lab 01/10/20 1744 01/10/20 1744 01/11/20 0359 01/12/20 0449 01/13/20 0500 01/13/20 1303 01/14/20 0359 01/15/20 0344 01/17/20 0500  NA 135  --  138 140 137  --  138 140 137  K 3.8   < > 4.3 3.4* 3.1* 3.8 3.6 3.6 4.1  CL 102  --  104 104 101  --  104 107 100  CO2 22  --  22 23 24   --  22 23 21*  GLUCOSE 306*  --  227* 173* 259*  --  255* 239* 174*  BUN 32*  --  38* 61* 49*  --  68* 86* 50*  CREATININE 1.86*  --  2.48* 3.63* 3.01*  --  3.84* 4.57* 3.46*  CALCIUM 7.4*  --  7.7* 7.8* 7.3*  --  7.5* 7.8* 7.7*  PHOS 3.7  --  4.0 4.6 3.4  --  3.7 4.3 4.4   < > = values in this interval not displayed.   CBC Recent Labs  Lab 01/11/20 0359 01/11/20 0359 01/12/20 0449 01/13/20 0839 01/14/20 0359 01/17/20 0500  WBC 25.4*   < > 20.9* 19.8* 15.8* 17.8*  NEUTROABS 22.5*  --   --   --   --   --  HGB 8.6*   < > 8.2* 7.8* 7.5* 8.0*  HCT 25.7*   < > 25.4* 24.3* 23.4* 25.3*  MCV 90.8   < > 94.8 98.0 97.5 96.9  PLT 90*   < > 85* 90* 99* 185   < > = values in this interval not displayed.      Medications:    . aspirin  81 mg Oral Daily  . Chlorhexidine Gluconate Cloth  6 each Topical Daily  . Chlorhexidine Gluconate Cloth  6 each Topical Q0600  . darbepoetin (ARANESP) injection - DIALYSIS  200 mcg Intravenous Q Tue-HD  . heparin      . heparin injection (subcutaneous)  5,000 Units Subcutaneous Q8H  . mouth rinse  15 mL Mouth Rinse q12n4p  . metoprolol tartrate  5 mg Intravenous Q6H  . multivitamin  1 tablet Per Tube QHS  . pantoprazole (PROTONIX) IV  40 mg Intravenous Q0600  . white petrolatum   Topical BID

## 2020-01-17 NOTE — Procedures (Signed)
Cortrak  Person Inserting Tube:  Rosezetta Schlatter, RD Tube Type:  Cortrak - 43 inches Tube Location:  Right nare Initial Placement:  Stomach Secured by: Bridle Technique Used to Measure Tube Placement:  Documented cm marking at nare/ corner of mouth Cortrak Secured At:  58 cm Procedure Comments:  Cortrak Tube Team Note:  Consult received to place a Cortrak feeding tube.   No x-ray is required. RN may begin using tube.   If the tube becomes dislodged please keep the tube and contact the Cortrak team at [email or web address] ([email or web address]) for replacement.  If after hours and replacement cannot be delayed, place a NG tube and confirm placement with an abdominal x-ray.     Jarome Matin, MS, RD, LDN, CNSC Inpatient Clinical Dietitian RD pager # available in [place]  After hours/weekend pager # available in [facility]

## 2020-01-17 NOTE — Progress Notes (Signed)
MD Wyline Copas, S made aware of blood in the stools. New orders received will continue to monitor and assess.

## 2020-01-17 NOTE — Progress Notes (Addendum)
Pt being transported to dialysis via transport x2, via bed. Pt on 3L nasal cannula via o2 tank. Pt vitals are WNLs.   Dialysis nurse stated to not give lopressor before treatment.

## 2020-01-17 NOTE — Consult Note (Signed)
   [facility] CM Inpatient Consult   01/17/2020  Patricia [name] 09/24/1956 [id number]   Patient screened for extreme high risk score for unplanned readmission score and for 3 hospitalizations in the past 6 months.  Patient in the [place] [facility] [ACO] with [place] [facility] [CM] services are needed.    Review of patient's medical record reveals patient is in the [place] [SNP] and needs can be managed in that external CM program..   Plan:  Patient care management needs will be evaluated in the [place] [facility] care management and update them of progress, if appropriate. Continue to follow progress and disposition to assess for post hospital care management needs to convey to [facility].    For questions contact:   Natividad Brood, RN BSN [facility] Liaison  [phone] business mobile phone Toll free office [phone]  Fax number: [phone] [email or web address]

## 2020-01-18 ENCOUNTER — Inpatient Hospital Stay (HOSPITAL_COMMUNITY): Payer: Medicare HMO

## 2020-01-18 DIAGNOSIS — Z66 Do not resuscitate: Secondary | ICD-10-CM | POA: Diagnosis not present

## 2020-01-18 DIAGNOSIS — R4182 Altered mental status, unspecified: Secondary | ICD-10-CM

## 2020-01-18 DIAGNOSIS — R1310 Dysphagia, unspecified: Secondary | ICD-10-CM | POA: Diagnosis not present

## 2020-01-18 DIAGNOSIS — Z789 Other specified health status: Secondary | ICD-10-CM | POA: Diagnosis not present

## 2020-01-18 DIAGNOSIS — G931 Anoxic brain damage, not elsewhere classified: Secondary | ICD-10-CM | POA: Diagnosis not present

## 2020-01-18 DIAGNOSIS — Z515 Encounter for palliative care: Secondary | ICD-10-CM | POA: Diagnosis not present

## 2020-01-18 LAB — CBC
HCT: 23.9 % — ABNORMAL LOW (ref 36.0–46.0)
Hemoglobin: 7.7 g/dL — ABNORMAL LOW (ref 12.0–15.0)
MCH: 31.4 pg (ref 26.0–34.0)
MCHC: 32.2 g/dL (ref 30.0–36.0)
MCV: 97.6 fL (ref 80.0–100.0)
Platelets: 188 10*3/uL (ref 150–400)
RBC: 2.45 MIL/uL — ABNORMAL LOW (ref 3.87–5.11)
RDW: 17.2 % — ABNORMAL HIGH (ref 11.5–15.5)
WBC: 16.3 10*3/uL — ABNORMAL HIGH (ref 4.0–10.5)
nRBC: 0 % (ref 0.0–0.2)

## 2020-01-18 LAB — RENAL FUNCTION PANEL
Albumin: 1.4 g/dL — ABNORMAL LOW (ref 3.5–5.0)
Anion gap: 14 (ref 5–15)
BUN: 74 mg/dL — ABNORMAL HIGH (ref 8–23)
CO2: 21 mmol/L — ABNORMAL LOW (ref 22–32)
Calcium: 8.2 mg/dL — ABNORMAL LOW (ref 8.9–10.3)
Chloride: 102 mmol/L (ref 98–111)
Creatinine, Ser: 4.73 mg/dL — ABNORMAL HIGH (ref 0.44–1.00)
GFR calc Af Amer: 11 mL/min — ABNORMAL LOW (ref >60)
GFR calc non Af Amer: 9 mL/min — ABNORMAL LOW (ref >60)
Glucose, Bld: 366 mg/dL — ABNORMAL HIGH (ref 70–99)
Phosphorus: 5.7 mg/dL — ABNORMAL HIGH (ref 2.5–4.6)
Potassium: 3.6 mmol/L (ref 3.5–5.1)
Sodium: 137 mmol/L (ref 135–145)

## 2020-01-18 LAB — GLUCOSE, CAPILLARY
Glucose-Capillary: 356 mg/dL — ABNORMAL HIGH (ref 70–99)
Glucose-Capillary: 193 mg/dL — ABNORMAL HIGH (ref 70–99)
Glucose-Capillary: 233 mg/dL — ABNORMAL HIGH (ref 70–99)
Glucose-Capillary: 315 mg/dL — ABNORMAL HIGH (ref 70–99)
Glucose-Capillary: 354 mg/dL — ABNORMAL HIGH (ref 70–99)
Glucose-Capillary: 357 mg/dL — ABNORMAL HIGH (ref 70–99)

## 2020-01-18 MED ORDER — LABETALOL HCL 5 MG/ML IV SOLN
5.0000 mg | INTRAVENOUS | Status: DC | PRN
  Filled 2020-01-18: qty 4

## 2020-01-18 MED ORDER — HEPARIN SODIUM (PORCINE) 1000 UNIT/ML IJ SOLN
1000.0000 [IU] | INTRAMUSCULAR | Status: DC | PRN
  Filled 2020-01-18: qty 1

## 2020-01-18 MED ORDER — HEPARIN SODIUM (PORCINE) 1000 UNIT/ML IJ SOLN: 2000.0000 [IU] | Freq: Once | INTRAMUSCULAR | Status: AC

## 2020-01-18 MED ORDER — HEPARIN SODIUM (PORCINE) 1000 UNIT/ML IJ SOLN
INTRAMUSCULAR | Status: AC
  Filled 2020-01-18: qty 1

## 2020-01-18 MED ORDER — HEPARIN SODIUM (PORCINE) 1000 UNIT/ML IJ SOLN
INTRAMUSCULAR | Status: AC
  Administered 2020-01-18: 3800 [IU] via INTRAVENOUS
  Filled 2020-01-18: qty 4

## 2020-01-18 MED ORDER — INSULIN ASPART 100 UNIT/ML ~~LOC~~ SOLN
4.0000 [IU] | Freq: Once | SUBCUTANEOUS | Status: AC
  Administered 2020-01-18: 4 [IU] via SUBCUTANEOUS

## 2020-01-18 MED ORDER — HEPARIN SODIUM (PORCINE) 1000 UNIT/ML DIALYSIS
2000.0000 [IU] | Freq: Once | INTRAMUSCULAR | Status: DC
  Filled 2020-01-18: qty 2

## 2020-01-18 MED ORDER — DARBEPOETIN ALFA 200 MCG/0.4ML IJ SOSY
PREFILLED_SYRINGE | INTRAMUSCULAR | Status: AC
  Administered 2020-01-18: 200 ug via INTRAVENOUS
  Filled 2020-01-18: qty 0.4

## 2020-01-18 MED ORDER — METOPROLOL TARTRATE 5 MG/5ML IV SOLN
10.0000 mg | Freq: Four times a day (QID) | INTRAVENOUS | Status: DC
  Administered 2020-01-19 – 2020-01-23 (×15): 10 mg via INTRAVENOUS
  Filled 2020-01-18 (×16): qty 10

## 2020-01-18 NOTE — Progress Notes (Signed)
Reached out to on call provider about elevating CBG. Orders added. CBG 385, insulin given, reached out to provider. No new orders.

## 2020-01-18 NOTE — Progress Notes (Signed)
   Vital Signs @MEWSNOTE @   Dr Wyline Copas notified of vitals resulting in a Red MEWS score. This is not an acute change for this patient. Patient did not receive her 0600 dose of metoprolol as she was scheduled for HD which she received. After returning to the floor with a MEWS of 6, this RN got another set of vitals that produced a MEWS of 4. Administered afternoon dose of metoprolol and will continue to monitor patient. Family is at bedside and no acute change or distress is noted.     Envi Eagleson 01/18/2020,1:38 PM

## 2020-01-18 NOTE — Evaluation (Signed)
Clinical/Bedside Swallow Evaluation Patient Details  Name: Patricia [name] MRN: [id number] Date of Birth: 09-Dec-1955  Today's Date: 01/18/2020 Time: SLP Start Time (ACUTE ONLY): 6967 SLP Stop Time (ACUTE ONLY): 1505 SLP Time Calculation (min) (ACUTE ONLY): 7 min  Past Medical History:  Past Medical History:  Diagnosis Date  . Allergy   . Anemia   . Anxiety   . Blood transfusion without reported diagnosis   . Cardiomyopathy ([place]) 11/2016   no ischemic eval due to CKD  . CHF (congestive heart failure) ([place])   . CKD (chronic kidney disease), stage IV ([place])   . Depression   . Diabetes mellitus without complication ([place])    type 2  . Diverticulitis   . Dyspnea   . Hypertension   . Obesity    Past Surgical History:  Past Surgical History:  Procedure Laterality Date  . ABDOMINAL HYSTERECTOMY    . BREAST SURGERY     reduction  . [place]  . IR FLUORO GUIDE CV LINE RIGHT  11/20/2019  . IR FLUORO GUIDE CV LINE RIGHT  11/23/2019  . IR US GUIDE VASC ACCESS RIGHT  11/20/2019  . LAPAROSCOPY N/A 06/16/2019   Procedure: primary closure of umbilical hernia;  Surgeon: Ralene Ok, MD;  Location: [facility];  Service: General;  Laterality: N/A;  . LIPOMA EXCISION     back  Dr. Excell Seltzer 03-22-18  . LIPOMA EXCISION N/A 03/22/2018   Procedure: EXCISION OF BACK LIPOMA;  Surgeon: Excell Seltzer, MD;  Location: [facility];  Service: General;  Laterality: N/A;  . REDUCTION MAMMAPLASTY Bilateral    HPI:  64 y.o.  with PMH ESRD, anemia, HFrEF, Depression, Type 2 DM, HTN and obesity who presented in cardiac arrest. CPR initiated and ROSC after 4mins on 3/20. Covid + at that time.  Intubated 3/20-4/5. Significant events included 3/28 became hypotensive with cardiac arrest (PEA). MRI 3/26 revealed small acute right occipital cortex infarct. MD suspected anoxic brain injury and metabolic encephalopathy however MRI reported infarct more likely to be embolus than anoxic brain injury.    Assessment / Plan / Recommendation Clinical Impression  Bedside swallow assessment completed. Sleeping upon arrival- eyes open to voice but was only able to maintain an awake state after therapist cleaned oral cavity (pt disliked). She did not follow directives to close lips, protrude tongue- noted significant upper lip injury When applesauce placed on anterior tongue/lips she may no attempts to close mouth or mobilize after extended time after cues and requests therefore, was suctioned. Family was not present at bedside during evaluation. From both physical and cognitive standpoints she cannot consume po's and will need longer term alternative nutrition. Overall prognosis hard to predict however ST will pick pt up for trial therapy for dysphagia.        SLP Visit Diagnosis: Dysphagia, unspecified (R13.10)    Aspiration Risk  Severe aspiration risk    Diet Recommendation NPO   Medication Administration: Via alternative means    Other  Recommendations Oral Care Recommendations: Oral care QID   Follow up Recommendations Skilled Nursing facility      Frequency and Duration min 1 x/week  2 weeks       Prognosis Prognosis for Safe Diet Advancement: Guarded Barriers to Reach Goals: Cognitive deficits;Severity of deficits      Swallow Study   General HPI: 64 y.o.  with PMH ESRD, anemia, HFrEF, Depression, Type 2 DM, HTN and obesity who presented in cardiac arrest. CPR initiated and ROSC after  52mins on 3/20. Covid + at that time.  Intubated 3/20-4/5. Significant events included 3/28 became hypotensive with cardiac arrest (PEA). MRI 3/26 revealed small acute right occipital cortex infarct. MD suspected anoxic brain injury and metabolic encephalopathy however MRI reported infarct more likely to be embolus than anoxic brain injury. Type of Study: Bedside Swallow Evaluation Previous Swallow Assessment: (none) Diet Prior to this Study: NPO;NG Tube Temperature Spikes Noted: No Respiratory  Status: Nasal cannula History of Recent Intubation: Yes Length of Intubations (days): 16 days Date extubated: 01/15/20 Behavior/Cognition: Lethargic/Drowsy;Doesn't follow directions Oral Cavity Assessment: Dry Oral Care Completed by SLP: Yes(attempted) Oral Cavity - Dentition: (uanble to fully view) Vision: Impaired for self-feeding Self-Feeding Abilities: Total assist Patient Positioning: Upright in bed Baseline Vocal Quality: (no vocalizations) Volitional Cough: Cognitively unable to elicit Volitional Swallow: Unable to elicit    Oral/Motor/Sensory Function Overall Oral Motor/Sensory Function: Generalized oral weakness   Ice Chips Ice chips: Not tested   Thin Liquid Thin Liquid: Not tested    Nectar Thick Nectar Thick Liquid: Not tested   Honey Thick Honey Thick Liquid: Not tested   Puree Puree: Impaired Presentation: Spoon Oral Phase Impairments: Other (comment);Poor awareness of bolus(no labial seal, no labial movement) Oral Phase Functional Implications: Oral holding   Solid     Solid: Not tested      Houston Siren 01/18/2020,3:32 PM  Orbie Pyo Colvin Caroli.Ed Risk analyst [phone] Office [phone]

## 2020-01-18 NOTE — Progress Notes (Signed)
PROGRESS NOTE    Patricia [name]  XVQ:[id number] DOB: 05-05-1956 DOA: 12/15/2019 PCP: Ladell Pier, MD    Brief Narrative:  [phone] with hx ESRD on HD, chronic systolic CHF, HTN, DM2 who was admitted for cardiac arrest. Pt was intubated in the ED and admitted to [facility] service. Pt underwent hypothermic protocol and later developed hypotension with PEA arrest. After family meeting, decision was made for one-way extubation on 4/5 with transfer to [place]:   Active Problems:   ESRD (end stage renal disease) on dialysis [facility])   Cardiac arrest [facility])   Palliative care by specialist   DNR (do not resuscitate)   Endotracheally intubated   Anoxic brain damage ([place])   Pressure injury of skin   Dysphagia   Altered mental status  Cardiac arrest: Initial out to facility on 3/20-followed by repeat cardiac arrest on 3/28.  Concerns of anoxic brain injury resulting from cardiac arrest as well as metabolic encephalopathy.  Echo with decreased EF-given poor prognosis-doubt further work-up will change management or outcome. Palliative care following. Plan to cont to monitor for now for any signs of meaningful recover  Anoxic brain injury: Had been seen by neurology-who do not see a meaningful chance for recovery. Seem stable  Toxic metabolic encephalopathy: EEG/MRI as above-supportive care continues.  ?CVA: Echo/carotid ultrasound performed. Per Neurology, more concerns of hypoxic/anoxic brain injury with stroke likely resultant of either small vessel disease or embolic infarct in the setting of cardiac arrest. Neurology recommends discussing with family regarding the possibility of an irreversible brain injury and grim chances for neurologically meaningful recovery  Dysphagia: Secondary to anoxic brain injury-Dr. Sloan Leiter discussed with palliative care team-who spoke with family --place cortak tube for nutrition-family wants a few more days to see how she progresses-before deciding on  PEG tube placement (family so far against PEG tube)   Acute hypoxic respiratory failure in the setting of cardiac arrest: Extubated on 4/5-currently stable on 2-3 L of oxygen. Ordered and reviewed CXR this AM, no acute change noted  Chronic systolic heart failure: Volume status stable-diuresis with HD.  Echo as above.  COVID-19 infection: Completed steroids/remdesivir.  Per ID note (on 4/4)-patient is no longer thought to be infectious-airborne/isolation discontinued.  ESRD: Renal following-dialysis per nephrology. On HD as tolerated  HTN: Uncontrolled-not getting Coreg as patient does not have the NG tube.  On scheduled IV Lopressorl.  Anemia: Multifactorial-acute illness-superimposed on ESRD related anemia.  Transfuse if hemoglobin less than 5.  Acute blood loss anemia: blood noted in stools this AM. Hgb this AM is up to 8.0 from prior value of 7.5. Will hold ASA and heparin subq. Will repeat CBC this afternoon  Thrombocytopenia: Appears improved from prior  Aspiration pneumonia: Has completed a course of antibiotics.  Palliative care/goals of care: DNR in place-palliative care NP spoke with family-Per palliative care-plans are to continue supportive care for a few days-to see if patient shows any signs of recovery.  We will go ahead and place NG tube for feedings-plan is to continue dialysis.  Palliative following. Plan to continue coretrak for now to cont to monitor for possibility of recovery before deciding on focus on palliation  Nutrition Problem: Nutrition Problem: Inadequate oral intake Etiology: acute illness Signs/Symptoms: NPO status Interventions: Tube feeding as toleraetd  Obesity: Estimated body mass index is 28.19 kg/m as calculated from the following:   Height as of this encounter: 5\' 4"  (1.626 m).   Weight as of this encounter: 74.5 kg.  Tachycardia -currently on scheduled IV lopressor at 5mg  q6h. Will increase to 10mg   DVT prophylaxis: SCD's,  hold heparin Code Status: DNR Family Communication: Pt in room, family not at bedside Disposition Plan: From home, dispo pending f/u discussion with Palliative care  Consultants:   PCCM   Neurology  Palliative Care  Nephrology  Procedures:  3/20>>4/5 ET tube 3/20 CVC > 3/24 11/23/19: R IJ tunneled perm cath:   Antimicrobials: Anti-infectives (From admission, onward)   Start     Dose/Rate Route Frequency Ordered Stop   01/11/20 1430  meropenem (MERREM) 500 mg in sodium chloride 0.9 % 100 mL IVPB     500 mg 200 mL/hr over 30 Minutes Intravenous Every 24 hours 01/11/20 0803 01/13/20 1509   01/10/20 2040  vancomycin variable dose per unstable renal function (pharmacist dosing)  Status:  Discontinued      Does not apply See admin instructions 01/10/20 2040 01/11/20 1748   01/10/20 1200  vancomycin (VANCOCIN) IVPB 750 mg/150 ml premix  Status:  Discontinued     750 mg 150 mL/hr over 60 Minutes Intravenous Every 24 hours 01/09/20 1218 01/10/20 2040   01/09/20 1400  meropenem (MERREM) 1 g in sodium chloride 0.9 % 100 mL IVPB  Status:  Discontinued     1 g 200 mL/hr over 30 Minutes Intravenous Every 8 hours 01/09/20 1218 01/10/20 2040   01/09/20 1200  vancomycin (VANCOCIN) IVPB 750 mg/150 ml premix  Status:  Discontinued     750 mg 150 mL/hr over 60 Minutes Intravenous Every T-Th-Sa (Hemodialysis) 01/07/20 1536 01/09/20 1218   01/07/20 1545  vancomycin (VANCOREADY) IVPB 1500 mg/300 mL     1,500 mg 150 mL/hr over 120 Minutes Intravenous  Once 01/07/20 1536 01/07/20 1754   01/07/20 1545  meropenem (MERREM) 500 mg in sodium chloride 0.9 % 100 mL IVPB  Status:  Discontinued     500 mg 200 mL/hr over 30 Minutes Intravenous Daily at bedtime 01/07/20 1536 01/09/20 1218   01/03/20 1000  piperacillin-tazobactam (ZOSYN) IVPB 3.375 g  Status:  Discontinued     3.375 g 12.5 mL/hr over 240 Minutes Intravenous Every 12 hours 01/03/20 0822 01/07/20 1539   01/02/20 1800  ceFEPIme (MAXIPIME) 2 g  in sodium chloride 0.9 % 100 mL IVPB  Status:  Discontinued     2 g 200 mL/hr over 30 Minutes Intravenous Every T-Th-Sa (1800) 12/16/2019 2129 01/01/20 0844   01/02/20 1200  vancomycin (VANCOCIN) IVPB 750 mg/150 ml premix  Status:  Discontinued     750 mg 150 mL/hr over 60 Minutes Intravenous Every T-Th-Sa (Hemodialysis) 01/05/2020 2129 01/01/20 0844   12/31/19 1000  remdesivir 100 mg in sodium chloride 0.9 % 100 mL IVPB     100 mg 200 mL/hr over 30 Minutes Intravenous Daily 12/21/2019 2243 01/03/20 0933   12/28/2019 2330  remdesivir 200 mg in sodium chloride 0.9% 250 mL IVPB     200 mg 580 mL/hr over 30 Minutes Intravenous Once 01/05/2020 2243 12/31/19 0315   12/26/2019 2045  ceFEPIme (MAXIPIME) 2 g in sodium chloride 0.9 % 100 mL IVPB     2 g 200 mL/hr over 30 Minutes Intravenous  Once 12/24/2019 2039 12/19/2019 2215   12/27/2019 2045  metroNIDAZOLE (FLAGYL) IVPB 500 mg     500 mg 100 mL/hr over 60 Minutes Intravenous  Once 12/23/2019 2039 01/08/2020 2256   01/03/2020 2045  vancomycin (VANCOCIN) IVPB 1000 mg/200 mL premix     1,000 mg 200 mL/hr over 60 Minutes Intravenous  Once 12/29/2019 2039 12/20/2019 2215      Subjective: Cannot obtain given current mentation  Objective: Vitals:   01/18/20 1030 01/18/20 1059 01/18/20 1300 01/18/20 1545  BP: 105/62 133/64 (!) 169/64 (!) 168/63  Pulse: (!) 113 (!) 111 (!) 121 (!) 120  Resp:  (!) 35 (!) 30   Temp:  98.2 F (36.8 C) 100 F (37.8 C) 100.1 F (37.8 C)  TempSrc:  Axillary Axillary   SpO2:  95% 96% 99%  Weight:  71.5 kg    Height:        Intake/Output Summary (Last 24 hours) at 01/18/2020 1847 Last data filed at 01/18/2020 1059 Gross per 24 hour  Intake 580 ml  Output 1385 ml  Net -805 ml   Filed Weights   01/16/20 0444 01/18/20 0750 01/18/20 1059  Weight: 74.5 kg 73.4 kg 71.5 kg    Examination: General exam: Awake, laying in bed, in nad Respiratory system: Normal respiratory effort, no wheezing Cardiovascular system: regular rate, s1,  s2 Gastrointestinal system: Soft, nondistended, positive BS Central nervous system: CN2-12 grossly intact, strength intact Extremities: Perfused, no clubbing Skin: Normal skin turgor, no notable skin lesions seen Psychiatry: Unable to assess given mentation  Data Reviewed: I have personally reviewed following labs and imaging studies  CBC: Recent Labs  Lab 01/13/20 0839 01/14/20 0359 01/17/20 0500 01/17/20 1500 01/18/20 0812  WBC 19.8* 15.8* 17.8* 15.1* 16.3*  HGB 7.8* 7.5* 8.0* 7.5* 7.7*  HCT 24.3* 23.4* 25.3* 23.8* 23.9*  MCV 98.0 97.5 96.9 96.0 97.6  PLT 90* 99* 185 173 517   Basic Metabolic Panel: Recent Labs  Lab 01/12/20 0449 01/12/20 0449 01/13/20 0500 01/13/20 0500 01/13/20 1303 01/14/20 0359 01/15/20 0344 01/17/20 0500 01/18/20 0812  NA 140   < > 137  --   --  138 140 137 137  K 3.4*   < > 3.1*   < > 3.8 3.6 3.6 4.1 3.6  CL 104   < > 101  --   --  104 107 100 102  CO2 23   < > 24  --   --  22 23 21* 21*  GLUCOSE 173*   < > 259*  --   --  255* 239* 174* 366*  BUN 61*   < > 49*  --   --  68* 86* 50* 74*  CREATININE 3.63*   < > 3.01*  --   --  3.84* 4.57* 3.46* 4.73*  CALCIUM 7.8*   < > 7.3*  --   --  7.5* 7.8* 7.7* 8.2*  MG 2.4  --  2.1  --   --  2.2 2.3  --   --   PHOS 4.6   < > 3.4  --   --  3.7 4.3 4.4 5.7*   < > = values in this interval not displayed.   GFR: Estimated Creatinine Clearance: 12.1 mL/min (A) (by C-G formula based on SCr of 4.73 mg/dL (H)). Liver Function Tests: Recent Labs  Lab 01/13/20 0500 01/14/20 0359 01/15/20 0344 01/17/20 0500 01/18/20 0812  ALBUMIN 1.2* 1.1* 1.3* 1.4* 1.4*   No results for input(s): LIPASE, AMYLASE in the last 168 hours. No results for input(s): AMMONIA in the last 168 hours. Coagulation Profile: No results for input(s): INR, PROTIME in the last 168 hours. Cardiac Enzymes: No results for input(s): CKTOTAL, CKMB, CKMBINDEX, TROPONINI in the last 168 hours. BNP (last 3 results) No results for input(s):  PROBNP in the last 8760 hours. HbA1C:  No results for input(s): HGBA1C in the last 72 hours. CBG: Recent Labs  Lab 01/17/20 2302 01/18/20 0338 01/18/20 1136 01/18/20 1201 01/18/20 1552  GLUCAP 285* 356* 193* 233* 315*   Lipid Profile: No results for input(s): CHOL, HDL, LDLCALC, TRIG, CHOLHDL, LDLDIRECT in the last 72 hours. Thyroid Function Tests: No results for input(s): TSH, T4TOTAL, FREET4, T3FREE, THYROIDAB in the last 72 hours. Anemia Panel: No results for input(s): VITAMINB12, FOLATE, FERRITIN, TIBC, IRON, RETICCTPCT in the last 72 hours. Sepsis Labs: No results for input(s): PROCALCITON, LATICACIDVEN in the last 168 hours.  Recent Results (from the past 240 hour(s))  Culture, blood (routine x 2)     Status: None   Collection Time: 01/10/20  9:44 AM   Specimen: BLOOD  Result Value Ref Range Status   Specimen Description BLOOD LEFT FOOT  Final   Special Requests   Final    BOTTLES DRAWN AEROBIC AND ANAEROBIC Blood Culture adequate volume   Culture   Final    NO GROWTH 5 DAYS Performed at [facility], [address] [place], [place] [address]    Report Status 01/15/2020 FINAL  Final  Culture, blood (routine x 2)     Status: None   Collection Time: 01/10/20  9:51 AM   Specimen: BLOOD  Result Value Ref Range Status   Specimen Description BLOOD LEFT FOOT  Final   Special Requests   Final    BOTTLES DRAWN AEROBIC AND ANAEROBIC Blood Culture adequate volume   Culture   Final    NO GROWTH 5 DAYS Performed at [facility], [place] [address] [place], [place] [address]    Report Status 01/15/2020 FINAL  Final  MRSA PCR Screening     Status: None   Collection Time: 01/11/20  9:53 AM   Specimen: Nasal Mucosa; Nasopharyngeal  Result Value Ref Range Status   MRSA by PCR NEGATIVE NEGATIVE Final    Comment:        The GeneXpert MRSA Assay (FDA approved for NASAL specimens only), is one component of a comprehensive MRSA colonization surveillance program. It is  not intended to diagnose MRSA infection nor to guide or monitor treatment for MRSA infections. Performed at [facility], [place] [address], [place], [place] [address]   Respiratory Panel by RT PCR (Flu A&B, Covid) - Nasopharyngeal Swab     Status: None   Collection Time: 01/14/20  9:38 AM   Specimen: Nasopharyngeal Swab  Result Value Ref Range Status   SARS Coronavirus 2 by RT PCR NEGATIVE NEGATIVE Final    Comment: (NOTE) SARS-CoV-2 target nucleic acids are NOT DETECTED. The SARS-CoV-2 RNA is generally detectable in upper respiratoy specimens during the acute phase of infection. The lowest concentration of SARS-CoV-2 viral copies this assay can detect is 131 copies/mL. A negative result does not preclude SARS-Cov-2 infection and should not be used as the sole basis for treatment or other patient management decisions. A negative result may occur with  improper specimen collection/handling, submission of specimen other than nasopharyngeal swab, presence of viral mutation(s) within the areas targeted by this assay, and inadequate number of viral copies (<131 copies/mL). A negative result must be combined with clinical observations, patient history, and epidemiological information. The expected result is Negative. Fact Sheet for Patients:  PinkCheek.be Fact Sheet for Healthcare Providers:  GravelBags.it This test is not yet ap proved or cleared by the [place] FDA and  has been authorized for detection and/or diagnosis of SARS-CoV-2  by FDA under an Emergency Use Authorization (EUA). This EUA will remain  in effect (meaning this test can be used) for the duration of the COVID-19 declaration under Section 564(b)(1) of the Act, 21 U.S.C. section 360bbb-3(b)(1), unless the authorization is terminated or revoked sooner.    Influenza A by PCR NEGATIVE NEGATIVE Final   Influenza B by PCR NEGATIVE NEGATIVE Final     Comment: (NOTE) The Xpert Xpress SARS-CoV-2/FLU/RSV assay is intended as an aid in  the diagnosis of influenza from Nasopharyngeal swab specimens and  should not be used as a sole basis for treatment. Nasal washings and  aspirates are unacceptable for Xpert Xpress SARS-CoV-2/FLU/RSV  testing. Fact Sheet for Patients: PinkCheek.be Fact Sheet for Healthcare Providers: GravelBags.it This test is not yet approved or cleared by the [place] FDA and  has been authorized for detection and/or diagnosis of SARS-CoV-2 by  FDA under an Emergency Use Authorization (EUA). This EUA will remain  in effect (meaning this test can be used) for the duration of the  Covid-19 declaration under Section 564(b)(1) of the Act, 21  U.S.C. section 360bbb-3(b)(1), unless the authorization is  terminated or revoked. Performed at [facility], [place] [address] [place], [place] [address]      Radiology Studies: DG CHEST PORT 1 VIEW  Result Date: 01/18/2020 CLINICAL DATA:  Tachypnea EXAM: PORTABLE CHEST 1 VIEW COMPARISON:  01/07/2020 FINDINGS: Feeding tube, dialysis catheter and central venous line unchanged. Interval extubation. Low lung volumes. No effusion, infiltrate pneumothorax. IMPRESSION: 1. extubation without complication. 2. Stable support apparatus otherwise. Electronically Signed   By: Suzy Bouchard M.D.   On: 01/18/2020 14:22    Scheduled Meds: . Chlorhexidine Gluconate Cloth  6 each Topical Daily  . Chlorhexidine Gluconate Cloth  6 each Topical Q0600  . darbepoetin (ARANESP) injection - DIALYSIS  200 mcg Intravenous Q Thu-HD  . insulin aspart  0-6 Units Subcutaneous Q4H  . mouth rinse  15 mL Mouth Rinse q12n4p  . metoprolol tartrate  5 mg Intravenous Q6H  . multivitamin  1 tablet Per Tube QHS  . pantoprazole (PROTONIX) IV  40 mg Intravenous Q0600  . white petrolatum   Topical BID   Continuous Infusions: . feeding supplement  (NEPRO CARB STEADY) 1,000 mL (01/18/20 1820)     LOS: 19 days   Marylu Lund, MD [facility] Pager On Amion  If 7PM-7AM, please contact night-coverage 01/18/2020, 6:47 PM

## 2020-01-18 NOTE — Progress Notes (Signed)
Pt MEWS score changes based on BP, RR, and HR. Attending and charge nurse are aware for these are no acute changes. Will continue to monitor and treat according to care plan.

## 2020-01-18 NOTE — Procedures (Signed)
Pt seen on HD, eyes open , not following commands. Sig UE/ LE edema, try to get vol down.  Pall care seeing pt.   I was present at this dialysis session, have reviewed the session itself and made  appropriate changes Kelly Splinter MD [place] pager [phone]   01/18/2020, 4:41 PM

## 2020-01-18 NOTE — Progress Notes (Signed)
Patient ID: TOMEKA KANTNER, female   DOB: [birth year], 64 y.o.   MRN: [id number]  This NP visited patient at the bedside as a follow up for palliative medicine needs and emotional support.   Patient's sister Verdis Frederickson at bedside.    Patient with a past medical history significant for end-stage renal disease/on hemodialysis, chronic systolic heart failure, hypertension, diabetes mellitus who was admitted on 12/23/2019 with an out of facility cardiac arrest.  Family initiated CPR, EMS performed 15 minutes of CPR with return of ROSC.  Patient was intubated in the emergency room, she under went hypothermic protocol but unfortunately on 01-07-27-21 developed hypotension and had a PEA arrest.  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   Decision was made with family for a one-way extubation on 01-15-20.     Above history reviewed.  Family had an understanding that it was unlikely that the patient would  survive off the vent.  Today is day 3 post extubation.    Patient is alert, she is nonverbal and unable to follow commands.  At times it seems that she makes eye contact but does not track.  She continues with a core track for nutritional support.    I spoke to the patient's daughter/Khyva  Hill today by telephone.   We had a continued conversation regarding the seriousness of her hypoxic/anoxic brain injury and neurology input reflecting irreversible brain damage and grim chances for neurologic recovery.  We discussed the concept of artificial feeding and hydration specifically to her mother situation and inability to safely consume oral intake.  She is high risk for aspiration.  When questioned regarding long-term artificial feeding daughter tells me that she does not believe that that is something that her mother would want.  Discussed with Lattie Haw with speech pathology.  Daughter tells me that family is "Taking it one day at a time", "My family is seeing progress, can you see  how hard this is for [place]"    Emotional support offered.    She was overwhelmed, " this is too much I cannot keep talking about this" and she asked to end the conversation and is agreeable for follow-up phone conversations  I discussed in detail the difference between an aggressive medical intervention path and a palliative comfort path for this patient at this time in this situation.   For now daughter wishes to continue with life prolonging measures as they remain hopeful for improvement.   Family needs "more time" to assess patient's progress and long-term prognosis  Discussed with daughter the importance of continued conversation with her family  and the medical providers regarding overall plan of care and treatment options,  ensuring decisions are within the context of the patients values and GOCs.  Questions and concerns addressed   Discussed with Dr Wyline Copas  This nurse practitioner informed  the family and the attending that I will be out of the hospital all next week.  The palliative medicine team will shadow for needs  Call palliative medicine team phone # [phone] with questions or concerns.  Total time spent on the unit was 35 minutes  Greater than 50% of the time was spent in counseling and coordination of care  Wadie Lessen NP  Palliative Medicine Team Team Phone # [phone] Pager [phone]

## 2020-01-18 NOTE — Progress Notes (Signed)
Dialysis nurse given report on pt. BP meds held this AM.

## 2020-01-18 NOTE — Progress Notes (Signed)
Nutrition Follow-up RD working remotely.  DOCUMENTATION CODES:   Not applicable  INTERVENTION:   Continue TF via Cortrak tube:   Nepro at goal rate of 50 ml/h (1200 ml per day)   Provides 2160 kcal, 97 gm protein, 872 ml free water daily   Continue Rena-vit per tube once daily  NUTRITION DIAGNOSIS:   Inadequate oral intake related to acute illness as evidenced by NPO status.  Ongoing  GOAL:   Patient will meet greater than or equal to 90% of their needs  Met with TF  MONITOR:   TF tolerance, Skin, Labs  REASON FOR ASSESSMENT:   Consult Enteral/tube feeding initiation and management  ASSESSMENT:   64 yo female admitted post cardiac arrest, COVID19. PMH includes ESRD on HD, CHF, DM, HTN, STEMI  Patient is currently in HD.  Cortrak feeding tube was placed 4/7. Nepro was increased to goal rate of 50 ml/h this morning. Tolerating without difficulty.  SLP following with plans to evaluate swallowing function today. Currently NPO.  Labs reviewed. Phos 5.7 (H), BUN 74 (H), Creat 4.73 (H) CBG: 356 this morning  Medications reviewed and include Aranesp, Rena-vit, Novolog very sensitive sliding scale.   Weight variable since admission, currently 73.4 kg Lowest weight since admission is 62.6 kg on admission. Outpatient EDW 62 kg.  Diet Order:   Diet Order            Diet NPO time specified  Diet effective now              EDUCATION NEEDS:   Not appropriate for education at this time  Skin:  Skin Assessment: Skin Integrity Issues: Skin Integrity Issues:: Other (Comment) DTI: upper lip Stage II: sacrum Other: R thigh wound  Last BM:  4/7 type 7, rectal tube  Height:   Ht Readings from Last 1 Encounters:  01/08/20 _0  (1.626 m)    Weight:   Wt Readings from Last 1 Encounters:  01/18/20 73.4 kg    BMI:  Body mass index is 27.78 kg/m.  Estimated Nutritional Needs:   Kcal:  1860-2170  Protein:  93-111 g  Fluid:  1000 mL plus  UOP    Molli Barrows, RD, LDN, CNSC Please refer to Amion for contact information.

## 2020-01-18 NOTE — Progress Notes (Signed)
Inpatient Diabetes Program Recommendations  AACE/ADA: New Consensus Statement on Inpatient Glycemic Control (2015)  Target Ranges:  Prepandial:   less than 140 mg/dL      Peak postprandial:   less than 180 mg/dL (1-2 hours)      Critically ill patients:  140 - 180 mg/dL   Lab Results  Component Value Date   GLUCAP 356 (H) 01/18/2020   HGBA1C 7.4 (H) 11/20/2019    Review of Glycemic Control Results for Patricia [name], Patricia [name] (MRN [id number]) as of 01/18/2020 10:02  Ref. Range 01/17/2020 11:25 01/17/2020 16:25 01/17/2020 19:32 01/17/2020 23:02 01/18/2020 03:38  Glucose-Capillary Latest Ref Range: 70 - 99 mg/dL 160 (H) 202 (H) 237 (H) 285 (H) 356 (H)     Diabetes history: DM2 Outpatient Diabetes medications: Lantus 6 units daily + Novolog 2 units TID Current orders for Inpatient glycemic control: Novolog 0-6 units every 4 hours+ Nepro carb steady @ 72ml/hr  Inpatient Diabetes Program Recommendations:     -Novolog 4 units every 4 hours tube feed coverage.  Stop if tube feeds held or discontinued  Thank you, Reche Dixon, RN, BSN Diabetes Coordinator Inpatient Diabetes Program [phone] (team pager from 8a-5p)

## 2020-01-19 DIAGNOSIS — Z66 Do not resuscitate: Secondary | ICD-10-CM | POA: Diagnosis not present

## 2020-01-19 DIAGNOSIS — Z789 Other specified health status: Secondary | ICD-10-CM | POA: Diagnosis not present

## 2020-01-19 LAB — GLUCOSE, CAPILLARY
Glucose-Capillary: 163 mg/dL — ABNORMAL HIGH (ref 70–99)
Glucose-Capillary: 194 mg/dL — ABNORMAL HIGH (ref 70–99)
Glucose-Capillary: 204 mg/dL — ABNORMAL HIGH (ref 70–99)
Glucose-Capillary: 228 mg/dL — ABNORMAL HIGH (ref 70–99)
Glucose-Capillary: 250 mg/dL — ABNORMAL HIGH (ref 70–99)
Glucose-Capillary: 341 mg/dL — ABNORMAL HIGH (ref 70–99)

## 2020-01-19 MED ORDER — CLONIDINE HCL 0.1 MG PO TABS
0.1000 mg | ORAL_TABLET | Freq: Once | ORAL | Status: AC
  Administered 2020-01-19: 0.1 mg via ORAL
  Filled 2020-01-19: qty 1

## 2020-01-19 MED ORDER — CHLORHEXIDINE GLUCONATE CLOTH 2 % EX PADS
6.0000 | MEDICATED_PAD | Freq: Every day | CUTANEOUS | Status: DC
  Administered 2020-01-21 – 2020-01-27 (×7): 6 via TOPICAL

## 2020-01-19 MED ORDER — INSULIN ASPART 100 UNIT/ML ~~LOC~~ SOLN
4.0000 [IU] | SUBCUTANEOUS | Status: DC
  Administered 2020-01-19 – 2020-01-23 (×20): 4 [IU] via SUBCUTANEOUS

## 2020-01-19 NOTE — Progress Notes (Signed)
Patient ID: Patricia [name], female   DOB: 07/24/1956, 64 y.o.   MRN: [id number]  [facility] Progress Note  Outpt HD: [facility]    400/800  62kg  Hep 1900  R IJ TDC  - mircera 225 q2wks , last 3/18  - calc tiw  Assessment/ Plan:   1.  Cardiac arrest: out-of-hospital arrest 12/19/2019 at end of HD, CPR 66min, ROSC in ED. Recurrent cardiac arrest on 3/28 PEA. Nothing reversible found on imaging or labs. Pt is DNR, extubated 4/5 and breathing on her own. Not responsive due to ABI.   2. Acute hypoxic respiratory failure - resolved  3. Sepsis - resolved, off pressors  4. HTN / volume  - back on low dose regimen; hx labile BP  - does have periph edema/ vol excess, no resp issues, get vol down w/ HD  5. Anoxic encephalopathy / brain injury - post arrest; low chance of meaningful recovery per neuro - patient would not want trach/PEG/LTACH per family  - pt is DNR, cont other medical support for now - per pall care notes >  family values QOL for their mother and they would not want SNF placement  6. ESRD: usual HD is TTS. SP CRRT 3/30 - 3/31. HD tomorrow on schedule   7.  COVID-19 infection: Incidental finding on admit, s/p remdesivir. Pt no longer needs airborne precautions.    Kelly Splinter, MD 01/19/2020, 3:29 PM       Subjective:   Pt not responsive seen in room     Objective:   BP (!) 170/72 (BP Location: Left Arm)   Pulse (!) 107   Temp 97.8 F (36.6 C)   Resp 20   Ht 5\' 4"  (1.626 m)   Wt 73.5 kg   SpO2 100%   BMI 27.81 kg/m   Physical Exam: General adult female in bed, head tilted up and to the left, not tracking or responding HEENT normocephalic atraumatic  Lungs coarse mechanical breath sounds; vent 30/5 Heart S1S2 no rub Extremities 2+ edema x 4 ext Neuro - not following commands  Access right chest tunneled catheter   Labs: BMET Recent Labs  Lab 01/13/20 0500 01/13/20 1303 01/14/20 0359 01/15/20 0344 01/17/20 0500 01/18/20 0812  NA 137   --  138 140 137 137  K 3.1* 3.8 3.6 3.6 4.1 3.6  CL 101  --  104 107 100 102  CO2 24  --  22 23 21* 21*  GLUCOSE 259*  --  255* 239* 174* 366*  BUN 49*  --  68* 86* 50* 74*  CREATININE 3.01*  --  3.84* 4.57* 3.46* 4.73*  CALCIUM 7.3*  --  7.5* 7.8* 7.7* 8.2*  PHOS 3.4  --  3.7 4.3 4.4 5.7*   CBC Recent Labs  Lab 01/14/20 0359 01/17/20 0500 01/17/20 1500 01/18/20 0812  WBC 15.8* 17.8* 15.1* 16.3*  HGB 7.5* 8.0* 7.5* 7.7*  HCT 23.4* 25.3* 23.8* 23.9*  MCV 97.5 96.9 96.0 97.6  PLT 99* 185 173 188      Medications:    . Chlorhexidine Gluconate Cloth  6 each Topical Daily  . Chlorhexidine Gluconate Cloth  6 each Topical Q0600  . darbepoetin (ARANESP) injection - DIALYSIS  200 mcg Intravenous Q Thu-HD  . heparin  2,000 Units Dialysis Once in dialysis  . insulin aspart  0-6 Units Subcutaneous Q4H  . insulin aspart  4 Units Subcutaneous Q4H  . mouth rinse  15 mL Mouth Rinse q12n4p  .  metoprolol tartrate  10 mg Intravenous Q6H  . multivitamin  1 tablet Per Tube QHS  . pantoprazole (PROTONIX) IV  40 mg Intravenous Q0600  . white petrolatum   Topical BID

## 2020-01-19 NOTE — Plan of Care (Signed)
  Problem: Cardiac: Goal: Ability to achieve and maintain adequate cardiopulmonary perfusion will improve Outcome: Not Progressing   Problem: Neurologic: Goal: Promote progressive neurologic recovery Outcome: Not Progressing   Problem: Skin Integrity: Goal: Risk for impaired skin integrity will be minimized. Outcome: Not Progressing   Problem: Education: Goal: Knowledge of General Education information will improve Description: Including pain rating scale, medication(s)/side effects and non-pharmacologic comfort measures Outcome: Not Progressing   Problem: Health Behavior/Discharge Planning: Goal: Ability to manage health-related needs will improve Outcome: Not Progressing   Problem: Clinical Measurements: Goal: Ability to maintain clinical measurements within normal limits will improve Outcome: Not Progressing Goal: Will remain free from infection Outcome: Not Progressing Goal: Diagnostic test results will improve Outcome: Not Progressing   Problem: Activity: Goal: Risk for activity intolerance will decrease Outcome: Not Progressing   Problem: Nutrition: Goal: Adequate nutrition will be maintained Outcome: Not Progressing   Problem: Coping: Goal: Level of anxiety will decrease Outcome: Not Progressing   Problem: Pain Managment: Goal: General experience of comfort will improve Outcome: Not Progressing   Problem: Safety: Goal: Ability to remain free from injury will improve Outcome: Not Progressing   Problem: Skin Integrity: Goal: Risk for impaired skin integrity will decrease Outcome: Not Progressing

## 2020-01-19 NOTE — Progress Notes (Signed)
PROGRESS NOTE    Patricia [name]  ZDG:[id number] DOB: 02-Sep-1956 DOA: 12/18/2019 PCP: Ladell Pier, MD    Brief Narrative:  [phone] with hx ESRD on HD, chronic systolic CHF, HTN, DM2 who was admitted for cardiac arrest. Pt was intubated in the ED and admitted to [facility] service. Pt underwent hypothermic protocol and later developed hypotension with PEA arrest. After family meeting, decision was made for one-way extubation on 4/5 with transfer to [place]:   Active Problems:   ESRD (end stage renal disease) on dialysis [facility])   Cardiac arrest [facility])   Palliative care by specialist   DNR (do not resuscitate)   Endotracheally intubated   Anoxic brain damage ([place])   Pressure injury of skin   Dysphagia   Altered mental status  Cardiac arrest: Initial out to facility on 3/20-followed by repeat cardiac arrest on 3/28.  Concerns of anoxic brain injury resulting from cardiac arrest as well as metabolic encephalopathy.  Echo with decreased EF-given poor prognosis-doubt further work-up will change management or outcome. Palliative care following. Plan to cont to monitor for now for any signs of meaningful recovery  Anoxic brain injury: Had been seen by neurology-who do not see a meaningful chance for recovery. Seem stable  Toxic metabolic encephalopathy: EEG/MRI as above-supportive care continues.  ?CVA: Echo/carotid ultrasound performed. Per Neurology, more concerns of hypoxic/anoxic brain injury with stroke likely resultant of either small vessel disease or embolic infarct in the setting of cardiac arrest. Neurology recommends discussing with family regarding the possibility of an irreversible brain injury and grim chances for neurologically meaningful recovery  Dysphagia: Secondary to anoxic brain injury-Dr. Sloan Leiter discussed with palliative care team-who spoke with family --place cortak tube for nutrition-family wants a few more days to see how she progresses-before deciding on  PEG tube placement (family so far against PEG tube)   Acute hypoxic respiratory failure in the setting of cardiac arrest: Extubated on 4/5-currently stable on 2-3 L of oxygen. Ordered and reviewed CXR this AM, no acute change noted  Chronic systolic heart failure: Volume status stable-diuresis with HD.  Echo as above.  COVID-19 infection: Completed steroids/remdesivir.  Per ID note (on 4/4)-patient is no longer thought to be infectious-airborne/isolation discontinued.  ESRD: Renal following-dialysis per nephrology. On HD as tolerated  HTN: Uncontrolled-not getting Coreg as patient does not have the NG tube.  On scheduled IV Lopressorl.  Anemia: Multifactorial-acute illness-superimposed on ESRD related anemia.  Transfuse if hemoglobin less than 5.  Acute blood loss anemia: blood noted in stools this AM. Hgb this AM is up to 8.0 from prior value of 7.5. Will hold ASA and heparin subq. Will repeat CBC this afternoon  Thrombocytopenia: Appears improved from prior  Aspiration pneumonia: Has completed a course of antibiotics.  Palliative care/goals of care: DNR in place-palliative care NP spoke with family-Per palliative care-plans are to continue supportive care for a few days-to see if patient shows any signs of recovery.  We will go ahead and place NG tube for feedings-plan is to continue dialysis.  Palliative following. Plan to continue coretrak for now to cont to monitor for possibility of recovery before deciding on focus on palliation  Nutrition Problem: Nutrition Problem: Inadequate oral intake Etiology: acute illness Signs/Symptoms: NPO status Interventions: Tube feeding as toleraetd  Obesity: Estimated body mass index is 28.19 kg/m as calculated from the following:   Height as of this encounter: 5\' 4"  (1.626 m).   Weight as of this encounter: 74.5 kg.  Tachycardia -IV lopressor dose recently increased to 10mg  q6hrs -HR has improved, although remains very slightly  tachycardic, currently 104  DVT prophylaxis: SCD's, hold heparin Code Status: DNR Family Communication: Pt in room, family not at bedside Disposition Plan: From home, dispo pending f/u discussion with Palliative care  Consultants:   PCCM   Neurology  Palliative Care  Nephrology  Procedures:  3/20>>4/5 ET tube 3/20 CVC > 3/24 11/23/19: R IJ tunneled perm cath:   Antimicrobials: Anti-infectives (From admission, onward)   Start     Dose/Rate Route Frequency Ordered Stop   01/11/20 1430  meropenem (MERREM) 500 mg in sodium chloride 0.9 % 100 mL IVPB     500 mg 200 mL/hr over 30 Minutes Intravenous Every 24 hours 01/11/20 0803 01/13/20 1509   01/10/20 2040  vancomycin variable dose per unstable renal function (pharmacist dosing)  Status:  Discontinued      Does not apply See admin instructions 01/10/20 2040 01/11/20 1748   01/10/20 1200  vancomycin (VANCOCIN) IVPB 750 mg/150 ml premix  Status:  Discontinued     750 mg 150 mL/hr over 60 Minutes Intravenous Every 24 hours 01/09/20 1218 01/10/20 2040   01/09/20 1400  meropenem (MERREM) 1 g in sodium chloride 0.9 % 100 mL IVPB  Status:  Discontinued     1 g 200 mL/hr over 30 Minutes Intravenous Every 8 hours 01/09/20 1218 01/10/20 2040   01/09/20 1200  vancomycin (VANCOCIN) IVPB 750 mg/150 ml premix  Status:  Discontinued     750 mg 150 mL/hr over 60 Minutes Intravenous Every T-Th-Sa (Hemodialysis) 01/07/20 1536 01/09/20 1218   01/07/20 1545  vancomycin (VANCOREADY) IVPB 1500 mg/300 mL     1,500 mg 150 mL/hr over 120 Minutes Intravenous  Once 01/07/20 1536 01/07/20 1754   01/07/20 1545  meropenem (MERREM) 500 mg in sodium chloride 0.9 % 100 mL IVPB  Status:  Discontinued     500 mg 200 mL/hr over 30 Minutes Intravenous Daily at bedtime 01/07/20 1536 01/09/20 1218   01/03/20 1000  piperacillin-tazobactam (ZOSYN) IVPB 3.375 g  Status:  Discontinued     3.375 g 12.5 mL/hr over 240 Minutes Intravenous Every 12 hours 01/03/20 0822  01/07/20 1539   01/02/20 1800  ceFEPIme (MAXIPIME) 2 g in sodium chloride 0.9 % 100 mL IVPB  Status:  Discontinued     2 g 200 mL/hr over 30 Minutes Intravenous Every T-Th-Sa (1800) 12/16/2019 2129 01/01/20 0844   01/02/20 1200  vancomycin (VANCOCIN) IVPB 750 mg/150 ml premix  Status:  Discontinued     750 mg 150 mL/hr over 60 Minutes Intravenous Every T-Th-Sa (Hemodialysis) 12/26/2019 2129 01/01/20 0844   12/31/19 1000  remdesivir 100 mg in sodium chloride 0.9 % 100 mL IVPB     100 mg 200 mL/hr over 30 Minutes Intravenous Daily 12/29/2019 2243 01/03/20 0933   01/10/2020 2330  remdesivir 200 mg in sodium chloride 0.9% 250 mL IVPB     200 mg 580 mL/hr over 30 Minutes Intravenous Once 12/19/2019 2243 12/31/19 0315   12/15/2019 2045  ceFEPIme (MAXIPIME) 2 g in sodium chloride 0.9 % 100 mL IVPB     2 g 200 mL/hr over 30 Minutes Intravenous  Once 01/09/2020 2039 12/18/2019 2215   12/19/2019 2045  metroNIDAZOLE (FLAGYL) IVPB 500 mg     500 mg 100 mL/hr over 60 Minutes Intravenous  Once 01/04/2020 2039 01/07/2020 2256   12/13/2019 2045  vancomycin (VANCOCIN) IVPB 1000 mg/200 mL premix     1,000 mg  200 mL/hr over 60 Minutes Intravenous  Once 12/31/2019 2039 12/28/2019 2215      Subjective: Unable to obtain given current mentation  Objective: Vitals:   01/19/20 0551 01/19/20 0652 01/19/20 0759 01/19/20 1250  BP: (!) 182/68 (!) 183/64 (!) 164/64 (!) 170/72  Pulse: (!) 112 (!) 104 (!) 107   Resp:   20   Temp:   97.8 F (36.6 C)   TempSrc:      SpO2: 100%  100%   Weight:  73.5 kg    Height:        Intake/Output Summary (Last 24 hours) at 01/19/2020 1549 Last data filed at 01/19/2020 0600 Gross per 24 hour  Intake 1843.33 ml  Output --  Net 1843.33 ml   Filed Weights   01/18/20 0750 01/18/20 1059 01/19/20 0652  Weight: 73.4 kg 71.5 kg 73.5 kg    Examination: General exam: Awake but not conversant, looking around, in no acute distress Respiratory system: normal chest rise, clear, no audible  wheezing Cardiovascular system: regular rhythm, s1-s2 Gastrointestinal system: Nondistended, nontender, pos BS Central nervous system: No seizures, no tremors Extremities: No cyanosis, no joint deformities Skin: No rashes, no pallor Psychiatry: Unable to assess given mentation  Data Reviewed: I have personally reviewed following labs and imaging studies  CBC: Recent Labs  Lab 01/13/20 0839 01/14/20 0359 01/17/20 0500 01/17/20 1500 01/18/20 0812  WBC 19.8* 15.8* 17.8* 15.1* 16.3*  HGB 7.8* 7.5* 8.0* 7.5* 7.7*  HCT 24.3* 23.4* 25.3* 23.8* 23.9*  MCV 98.0 97.5 96.9 96.0 97.6  PLT 90* 99* 185 173 062   Basic Metabolic Panel: Recent Labs  Lab 01/13/20 0500 01/13/20 0500 01/13/20 1303 01/14/20 0359 01/15/20 0344 01/17/20 0500 01/18/20 0812  NA 137  --   --  138 140 137 137  K 3.1*   < > 3.8 3.6 3.6 4.1 3.6  CL 101  --   --  104 107 100 102  CO2 24  --   --  22 23 21* 21*  GLUCOSE 259*  --   --  255* 239* 174* 366*  BUN 49*  --   --  68* 86* 50* 74*  CREATININE 3.01*  --   --  3.84* 4.57* 3.46* 4.73*  CALCIUM 7.3*  --   --  7.5* 7.8* 7.7* 8.2*  MG 2.1  --   --  2.2 2.3  --   --   PHOS 3.4  --   --  3.7 4.3 4.4 5.7*   < > = values in this interval not displayed.   GFR: Estimated Creatinine Clearance: 12.3 mL/min (A) (by C-G formula based on SCr of 4.73 mg/dL (H)). Liver Function Tests: Recent Labs  Lab 01/13/20 0500 01/14/20 0359 01/15/20 0344 01/17/20 0500 01/18/20 0812  ALBUMIN 1.2* 1.1* 1.3* 1.4* 1.4*   No results for input(s): LIPASE, AMYLASE in the last 168 hours. No results for input(s): AMMONIA in the last 168 hours. Coagulation Profile: No results for input(s): INR, PROTIME in the last 168 hours. Cardiac Enzymes: No results for input(s): CKTOTAL, CKMB, CKMBINDEX, TROPONINI in the last 168 hours. BNP (last 3 results) No results for input(s): PROBNP in the last 8760 hours. HbA1C: No results for input(s): HGBA1C in the last 72 hours. CBG: Recent Labs   Lab 01/18/20 1911 01/18/20 2317 01/19/20 0332 01/19/20 0758 01/19/20 1238  GLUCAP 354* 357* 341* 250* 228*   Lipid Profile: No results for input(s): CHOL, HDL, LDLCALC, TRIG, CHOLHDL, LDLDIRECT in the last 72 hours.  Thyroid Function Tests: No results for input(s): TSH, T4TOTAL, FREET4, T3FREE, THYROIDAB in the last 72 hours. Anemia Panel: No results for input(s): VITAMINB12, FOLATE, FERRITIN, TIBC, IRON, RETICCTPCT in the last 72 hours. Sepsis Labs: No results for input(s): PROCALCITON, LATICACIDVEN in the last 168 hours.  Recent Results (from the past 240 hour(s))  Culture, blood (routine x 2)     Status: None   Collection Time: 01/10/20  9:44 AM   Specimen: BLOOD  Result Value Ref Range Status   Specimen Description BLOOD LEFT FOOT  Final   Special Requests   Final    BOTTLES DRAWN AEROBIC AND ANAEROBIC Blood Culture adequate volume   Culture   Final    NO GROWTH 5 DAYS Performed at [facility], [address], [place], [place] [address]    Report Status 01/15/2020 FINAL  Final  Culture, blood (routine x 2)     Status: None   Collection Time: 01/10/20  9:51 AM   Specimen: BLOOD  Result Value Ref Range Status   Specimen Description BLOOD LEFT FOOT  Final   Special Requests   Final    BOTTLES DRAWN AEROBIC AND ANAEROBIC Blood Culture adequate volume   Culture   Final    NO GROWTH 5 DAYS Performed at [facility], [place] [address] [place], [place] [address]    Report Status 01/15/2020 FINAL  Final  MRSA PCR Screening     Status: None   Collection Time: 01/11/20  9:53 AM   Specimen: Nasal Mucosa; Nasopharyngeal  Result Value Ref Range Status   MRSA by PCR NEGATIVE NEGATIVE Final    Comment:        The GeneXpert MRSA Assay (FDA approved for NASAL specimens only), is one component of a comprehensive MRSA colonization surveillance program. It is not intended to diagnose MRSA infection nor to guide or monitor treatment for MRSA infections. Performed at  [facility], [place] [address] [place], [place] [address]   Respiratory Panel by RT PCR (Flu A&B, Covid) - Nasopharyngeal Swab     Status: None   Collection Time: 01/14/20  9:38 AM   Specimen: Nasopharyngeal Swab  Result Value Ref Range Status   SARS Coronavirus 2 by RT PCR NEGATIVE NEGATIVE Final    Comment: (NOTE) SARS-CoV-2 target nucleic acids are NOT DETECTED. The SARS-CoV-2 RNA is generally detectable in upper respiratoy specimens during the acute phase of infection. The lowest concentration of SARS-CoV-2 viral copies this assay can detect is 131 copies/mL. A negative result does not preclude SARS-Cov-2 infection and should not be used as the sole basis for treatment or other patient management decisions. A negative result may occur with  improper specimen collection/handling, submission of specimen other than nasopharyngeal swab, presence of viral mutation(s) within the areas targeted by this assay, and inadequate number of viral copies (<131 copies/mL). A negative result must be combined with clinical observations, patient history, and epidemiological information. The expected result is Negative. Fact Sheet for Patients:  PinkCheek.be Fact Sheet for Healthcare Providers:  GravelBags.it This test is not yet ap proved or cleared by the [place] FDA and  has been authorized for detection and/or diagnosis of SARS-CoV-2 by FDA under an Emergency Use Authorization (EUA). This EUA will remain  in effect (meaning this test can be used) for the duration of the COVID-19 declaration under Section 564(b)(1) of the Act, 21 U.S.C. section 360bbb-3(b)(1), unless the authorization is terminated or revoked sooner.    Influenza A  by PCR NEGATIVE NEGATIVE Final   Influenza B by PCR NEGATIVE NEGATIVE Final    Comment: (NOTE) The Xpert Xpress SARS-CoV-2/FLU/RSV assay is intended as an aid in  the diagnosis of influenza from  Nasopharyngeal swab specimens and  should not be used as a sole basis for treatment. Nasal washings and  aspirates are unacceptable for Xpert Xpress SARS-CoV-2/FLU/RSV  testing. Fact Sheet for Patients: PinkCheek.be Fact Sheet for Healthcare Providers: GravelBags.it This test is not yet approved or cleared by the [place] FDA and  has been authorized for detection and/or diagnosis of SARS-CoV-2 by  FDA under an Emergency Use Authorization (EUA). This EUA will remain  in effect (meaning this test can be used) for the duration of the  Covid-19 declaration under Section 564(b)(1) of the Act, 21  U.S.C. section 360bbb-3(b)(1), unless the authorization is  terminated or revoked. Performed at [facility], [place] [address], [place], [place] [address]      Radiology Studies: DG CHEST PORT 1 VIEW  Result Date: 01/18/2020 CLINICAL DATA:  Tachypnea EXAM: PORTABLE CHEST 1 VIEW COMPARISON:  01/07/2020 FINDINGS: Feeding tube, dialysis catheter and central venous line unchanged. Interval extubation. Low lung volumes. No effusion, infiltrate pneumothorax. IMPRESSION: 1. extubation without complication. 2. Stable support apparatus otherwise. Electronically Signed   By: Suzy Bouchard M.D.   On: 01/18/2020 14:22    Scheduled Meds: . Chlorhexidine Gluconate Cloth  6 each Topical Daily  . Chlorhexidine Gluconate Cloth  6 each Topical Q0600  . [START ON 01/20/2020] Chlorhexidine Gluconate Cloth  6 each Topical Q0600  . darbepoetin (ARANESP) injection - DIALYSIS  200 mcg Intravenous Q Thu-HD  . heparin  2,000 Units Dialysis Once in dialysis  . insulin aspart  0-6 Units Subcutaneous Q4H  . insulin aspart  4 Units Subcutaneous Q4H  . mouth rinse  15 mL Mouth Rinse q12n4p  . metoprolol tartrate  10 mg Intravenous Q6H  . multivitamin  1 tablet Per Tube QHS  . pantoprazole (PROTONIX) IV  40 mg Intravenous Q0600  . white petrolatum    Topical BID   Continuous Infusions: . feeding supplement (NEPRO CARB STEADY) 50 mL/hr at 01/18/20 2054     LOS: 20 days   Marylu Lund, MD [facility] Pager On Amion  If 7PM-7AM, please contact night-coverage 01/19/2020, 3:49 PM

## 2020-01-20 ENCOUNTER — Inpatient Hospital Stay (HOSPITAL_COMMUNITY): Payer: Medicare HMO

## 2020-01-20 DIAGNOSIS — Z66 Do not resuscitate: Secondary | ICD-10-CM | POA: Diagnosis not present

## 2020-01-20 DIAGNOSIS — Z789 Other specified health status: Secondary | ICD-10-CM | POA: Diagnosis not present

## 2020-01-20 DIAGNOSIS — Z452 Encounter for adjustment and management of vascular access device: Secondary | ICD-10-CM | POA: Diagnosis not present

## 2020-01-20 LAB — GLUCOSE, CAPILLARY
Glucose-Capillary: 123 mg/dL — ABNORMAL HIGH (ref 70–99)
Glucose-Capillary: 188 mg/dL — ABNORMAL HIGH (ref 70–99)
Glucose-Capillary: 197 mg/dL — ABNORMAL HIGH (ref 70–99)
Glucose-Capillary: 234 mg/dL — ABNORMAL HIGH (ref 70–99)
Glucose-Capillary: 259 mg/dL — ABNORMAL HIGH (ref 70–99)
Glucose-Capillary: 272 mg/dL — ABNORMAL HIGH (ref 70–99)
Glucose-Capillary: 276 mg/dL — ABNORMAL HIGH (ref 70–99)

## 2020-01-20 LAB — PROCALCITONIN: Procalcitonin: 5.63 ng/mL

## 2020-01-20 LAB — C-REACTIVE PROTEIN: CRP: 31.6 mg/dL — ABNORMAL HIGH (ref <1.0)

## 2020-01-20 LAB — FERRITIN: Ferritin: 6975 ng/mL — ABNORMAL HIGH (ref 11–307)

## 2020-01-20 MED ORDER — HEPARIN SODIUM (PORCINE) 1000 UNIT/ML IJ SOLN
INTRAMUSCULAR | Status: AC
  Filled 2020-01-20: qty 4

## 2020-01-20 MED ORDER — MORPHINE SULFATE (PF) 2 MG/ML IV SOLN
0.5000 mg | Freq: Once | INTRAVENOUS | Status: AC
  Administered 2020-01-20: 0.5 mg via INTRAVENOUS
  Filled 2020-01-20: qty 1

## 2020-01-20 MED ORDER — METHYLPREDNISOLONE SODIUM SUCC 40 MG IJ SOLR
40.0000 mg | Freq: Two times a day (BID) | INTRAMUSCULAR | Status: DC
  Administered 2020-01-20 – 2020-01-21 (×4): 40 mg via INTRAVENOUS
  Filled 2020-01-20 (×4): qty 1

## 2020-01-20 MED ORDER — IPRATROPIUM-ALBUTEROL 0.5-2.5 (3) MG/3ML IN SOLN: 3.0000 mL | RESPIRATORY_TRACT | Status: DC | PRN

## 2020-01-20 MED ORDER — PIPERACILLIN-TAZOBACTAM IN DEX 2-0.25 GM/50ML IV SOLN
2.2500 g | Freq: Three times a day (TID) | INTRAVENOUS | Status: DC
  Administered 2020-01-20 – 2020-01-23 (×9): 2.25 g via INTRAVENOUS
  Filled 2020-01-20 (×9): qty 50

## 2020-01-20 NOTE — Progress Notes (Signed)
Patient ID: LATINA FRANK, female   DOB: March 30, 1956, 64 y.o.   MRN: [id number]  [facility] Progress Note  Outpt HD: [facility] TTS    400/800  62kg  Hep 1900  R IJ TDC  - mircera 225 q2wks , last 3/18  - calc tiw  Assessment/ Plan:   1.  Cardiac arrest: out-of-hospital arrest 12/17/2019 at end of HD, CPR 47min, ROSC in ED. Recurrent cardiac arrest on 3/28 PEA. Nothing reversible found on imaging or labs. Pt is DNR, extubated 4/5 and breathing on her own. Not responding well felt to have some ABI.    2. Acute hypoxic respiratory failure - resolved  3. Sepsis - resolved, off pressors  4. HTN / volume  - back on low dose regimen; hx labile BP  - does have periph edema/ vol excess, no resp issues, get vol down w/ HD  5. Anoxic encephalopathy / brain injury - post arrest; low chance of meaningful recovery per neuro - patient would not want trach/PEG/LTACH per family  - pt is DNR, cont other medical support for now - per pall care notes >  family values QOL for their mother and they would not want SNF placement  6. ESRD: usual HD is TTS. SP CRRT 3/30 - 3/31. HD today on schedule   7.  COVID-19 infection: Incidental finding on admit, s/p remdesivir. Pt no longer needs airborne precautions.    Kelly Splinter, MD 01/20/2020, 2:55 PM       Subjective:   Pt not responsive seen in room     Objective:   BP 119/64   Pulse (!) 127   Temp 97.9 F (36.6 C) (Axillary)   Resp (!) 41   Ht 5\' 4"  (1.626 m)   Wt 73.5 kg   SpO2 100%   BMI 27.81 kg/m   Physical Exam: General adult female in bed, head tilted up and to the left, not tracking or responding HEENT normocephalic atraumatic  Lungs coarse mechanical breath sounds; vent 30/5 Heart S1S2 no rub Extremities 2+ edema x 4 ext Neuro - not following commands  Access right chest tunneled catheter   Labs: BMET Recent Labs  Lab 01/14/20 0359 01/15/20 0344 01/17/20 0500 01/18/20 0812  NA 138 140 137 137  K 3.6 3.6 4.1  3.6  CL 104 107 100 102  CO2 22 23 21* 21*  GLUCOSE 255* 239* 174* 366*  BUN 68* 86* 50* 74*  CREATININE 3.84* 4.57* 3.46* 4.73*  CALCIUM 7.5* 7.8* 7.7* 8.2*  PHOS 3.7 4.3 4.4 5.7*   CBC Recent Labs  Lab 01/14/20 0359 01/17/20 0500 01/17/20 1500 01/18/20 0812  WBC 15.8* 17.8* 15.1* 16.3*  HGB 7.5* 8.0* 7.5* 7.7*  HCT 23.4* 25.3* 23.8* 23.9*  MCV 97.5 96.9 96.0 97.6  PLT 99* 185 173 188      Medications:    . Chlorhexidine Gluconate Cloth  6 each Topical Daily  . Chlorhexidine Gluconate Cloth  6 each Topical Q0600  . Chlorhexidine Gluconate Cloth  6 each Topical Q0600  . darbepoetin (ARANESP) injection - DIALYSIS  200 mcg Intravenous Q Thu-HD  . heparin  2,000 Units Dialysis Once in dialysis  . insulin aspart  0-6 Units Subcutaneous Q4H  . insulin aspart  4 Units Subcutaneous Q4H  . mouth rinse  15 mL Mouth Rinse q12n4p  . methylPREDNISolone (SOLU-MEDROL) injection  40 mg Intravenous BID  . metoprolol tartrate  10 mg Intravenous Q6H  . multivitamin  1 tablet Per Tube QHS  .  pantoprazole (PROTONIX) IV  40 mg Intravenous Q0600  . white petrolatum   Topical BID

## 2020-01-20 NOTE — Progress Notes (Signed)
Pharmacy Antibiotic Note  Patricia [name] is a 64 y.o. female admitted on 01/05/2020 with aspiration pneumonia.  Pharmacy has been consulted for zosyn dosing.  Increased respiratory effort today. WBC on last check 16.3 (4/8), afebrile. CXR showing opacity in L perihilar region - possibly PNA vs atelectasis. ESRD patient.  Plan: Zosyn 2.25 g IV every 8 hours  Monitor HD plans, clinical pic, cx results, and opportunities for de-escalation.   Height: 5\' 4"  (162.6 cm) Weight: (floor to weight) IBW/kg (Calculated) : 54.7  Temp (24hrs), Avg:98.2 F (36.8 C), Min:97.3 F (36.3 C), Max:99.3 F (37.4 C)  Recent Labs  Lab 01/14/20 0359 01/15/20 0344 01/17/20 0500 01/17/20 1500 01/18/20 0812  WBC 15.8*  --  17.8* 15.1* 16.3*  CREATININE 3.84* 4.57* 3.46*  --  4.73*    Estimated Creatinine Clearance: 12.3 mL/min (A) (by C-G formula based on SCr of 4.73 mg/dL (H)).    Allergies  Allergen Reactions  . Codone [Hydrocodone] Itching  . Norvasc [Amlodipine Besylate] Swelling    Lower extremity  . Dextromethorphan-Guaifenesin Nausea And Vomiting  . Hydralazine Swelling and Other (See Comments)    Leg swelling. Pt takes this as o/p. Patient can take PO now  . Sulfa Antibiotics Itching and Rash    Antimicrobials this admission: Vanc 3/20>>3/22, restarted 3/28 >>4/1 Merrem 3/28 >> 4/3 Cefepime 3/20>>3/22 Zosyn 3/22>>3/28, 4/10>> Flagyl x 1 3/20  Dose adjustments this admission: N/A  Microbiology results: 3/20 COVID - positive 3/20 BCx - negative 3/21 MRSA - negative 3/23 RA - Enterobacter cloacae (S Fortaz, Cipro, Primaxin, Septra, Zosyn) 3/29 BCx - ?canceled 3/31 BCx (after restart abx) >> ngtd 4/1 MRSA PCR negative  Thank you for allowing pharmacy to be a part of this patient's care.  Antonietta Jewel, PharmD, [place] Clinical Pharmacist  Phone: [phone] 01/20/2020 4:50 PM  Please check AMION for all [facility] phone numbers After 10:00 PM, call [place] [phone]

## 2020-01-20 NOTE — Progress Notes (Signed)
PROGRESS NOTE    Patricia [name]  FFM:[id number] DOB: 02/17/1956 DOA: 12/26/2019 PCP: Ladell Pier, MD    Brief Narrative:  [phone] with hx ESRD on HD, chronic systolic CHF, HTN, DM2 who was admitted for cardiac arrest. Pt was intubated in the ED and admitted to [facility] service. Pt underwent hypothermic protocol and later developed hypotension with PEA arrest. After family meeting, decision was made for one-way extubation on 4/5 with transfer to [place]:   Active Problems:   ESRD (end stage renal disease) on dialysis [facility])   Cardiac arrest [facility])   Palliative care by specialist   DNR (do not resuscitate)   Endotracheally intubated   Anoxic brain damage ([place])   Pressure injury of skin   Dysphagia   Altered mental status  Cardiac arrest: Initial out to facility on 3/20-followed by repeat cardiac arrest on 3/28.  Concerns of anoxic brain injury resulting from cardiac arrest as well as metabolic encephalopathy.  Echo with decreased EF-given poor prognosis-doubt further work-up will change management or outcome. Palliative care following. Plan to cont to monitor for now for any signs of meaningful recovery  Anoxic brain injury: Had been seen by neurology-who do not see a meaningful chance for recovery. Seem stable at present. Updated family at bedside regarding prognosis with concern that patient may not be able to recover to her baseline state  Toxic metabolic encephalopathy: EEG/MRI as above-supportive care continues.  ?CVA: Echo/carotid ultrasound performed. Per Neurology, more concerns of hypoxic/anoxic brain injury with stroke likely resultant of either small vessel disease or embolic infarct in the setting of cardiac arrest. Neurology recommends discussing with family regarding the possibility of an irreversible brain injury and grim chances for neurologically meaningful recovery  Dysphagia: Secondary to anoxic brain injury-Dr. Sloan Leiter discussed with palliative care  team-who spoke with family --place cortak tube for nutrition-family wants a few more days to see how she progresses-before deciding on PEG tube placement (family so far against PEG tube)   Acute hypoxic respiratory failure in the setting of cardiac arrest: Extubated on 4/5-currently remains on 2-3 L of oxygen. See below. This AM, pt with increased resp effort. Have ordered and reviewed CXR, findings noting opacity in the L perihilar region. CXR on 4/8 was noted to be clear. -Given concerns of possible re-aspiration, have restarted zosyn -give recent covid hx, have also started IV steroids  Chronic systolic heart failure: Volume status stable-diuresis with HD.  Echo as above.  COVID-19 infection: Completed steroids/remdesivir.  Per ID note (on 4/4)-patient is no longer thought to be infectious-airborne/isolation discontinued.  ESRD: Renal following-dialysis per nephrology. On HD as tolerated  HTN: Uncontrolled-not getting Coreg as patient does not have the NG tube. Pt is continued on scheduled IV Lopressorl.  Anemia: Multifactorial-acute illness-superimposed on ESRD related anemia.  Transfuse if hemoglobin less than 5.  Acute blood loss anemia: blood noted in stools this AM. Hgb this AM is up to 8.0 from prior value of 7.5. Will hold ASA and heparin subq. Will repeat CBC this afternoon  Thrombocytopenia: Appears improved from prior  Aspiration pneumonia: Had previously completed a course of antibiotics. See above. New worsening resp effort  Palliative care/goals of care: DNR in place-palliative care NP spoke with family-Per palliative care-plans are to continue supportive care for a few days-to see if patient shows any signs of recovery.  We will go ahead and place NG tube for feedings-plan is to continue dialysis.  Palliative following. Plan to continue coretrak for now  to cont to monitor for possibility of recovery before deciding on focus on palliation  Nutrition  Problem: Nutrition Problem: Inadequate oral intake Etiology: acute illness Signs/Symptoms: NPO status Interventions: Tube feeding as toleraetd  Obesity: Estimated body mass index is 27.18kg/m as calculated from the following:   Height as of this encounter: 5\' 4"  (1.626 m).   Weight as of this encounter: 73.5 kg.   Tachycardia -IV lopressor dose recently increased to 10mg  q6hrs -Remains tachycardic  DVT prophylaxis: SCD's, hold heparin Code Status: DNR Family Communication: Pt in room, family at bedside Disposition Plan: From home, dispo pending f/u discussion with Palliative care  Consultants:   PCCM   Neurology  Palliative Care  Nephrology  Procedures:  3/20>>4/5 ET tube 3/20 CVC > 3/24 11/23/19: R IJ tunneled perm cath:   Antimicrobials: Anti-infectives (From admission, onward)   Start     Dose/Rate Route Frequency Ordered Stop   01/11/20 1430  meropenem (MERREM) 500 mg in sodium chloride 0.9 % 100 mL IVPB     500 mg 200 mL/hr over 30 Minutes Intravenous Every 24 hours 01/11/20 0803 01/13/20 1509   01/10/20 2040  vancomycin variable dose per unstable renal function (pharmacist dosing)  Status:  Discontinued      Does not apply See admin instructions 01/10/20 2040 01/11/20 1748   01/10/20 1200  vancomycin (VANCOCIN) IVPB 750 mg/150 ml premix  Status:  Discontinued     750 mg 150 mL/hr over 60 Minutes Intravenous Every 24 hours 01/09/20 1218 01/10/20 2040   01/09/20 1400  meropenem (MERREM) 1 g in sodium chloride 0.9 % 100 mL IVPB  Status:  Discontinued     1 g 200 mL/hr over 30 Minutes Intravenous Every 8 hours 01/09/20 1218 01/10/20 2040   01/09/20 1200  vancomycin (VANCOCIN) IVPB 750 mg/150 ml premix  Status:  Discontinued     750 mg 150 mL/hr over 60 Minutes Intravenous Every T-Th-Sa (Hemodialysis) 01/07/20 1536 01/09/20 1218   01/07/20 1545  vancomycin (VANCOREADY) IVPB 1500 mg/300 mL     1,500 mg 150 mL/hr over 120 Minutes Intravenous  Once 01/07/20 1536  01/07/20 1754   01/07/20 1545  meropenem (MERREM) 500 mg in sodium chloride 0.9 % 100 mL IVPB  Status:  Discontinued     500 mg 200 mL/hr over 30 Minutes Intravenous Daily at bedtime 01/07/20 1536 01/09/20 1218   01/03/20 1000  piperacillin-tazobactam (ZOSYN) IVPB 3.375 g  Status:  Discontinued     3.375 g 12.5 mL/hr over 240 Minutes Intravenous Every 12 hours 01/03/20 0822 01/07/20 1539   01/02/20 1800  ceFEPIme (MAXIPIME) 2 g in sodium chloride 0.9 % 100 mL IVPB  Status:  Discontinued     2 g 200 mL/hr over 30 Minutes Intravenous Every T-Th-Sa (1800) 12/31/2019 2129 01/01/20 0844   01/02/20 1200  vancomycin (VANCOCIN) IVPB 750 mg/150 ml premix  Status:  Discontinued     750 mg 150 mL/hr over 60 Minutes Intravenous Every T-Th-Sa (Hemodialysis) 12/25/2019 2129 01/01/20 0844   12/31/19 1000  remdesivir 100 mg in sodium chloride 0.9 % 100 mL IVPB     100 mg 200 mL/hr over 30 Minutes Intravenous Daily 12/29/2019 2243 01/03/20 0933   12/29/2019 2330  remdesivir 200 mg in sodium chloride 0.9% 250 mL IVPB     200 mg 580 mL/hr over 30 Minutes Intravenous Once 01/10/2020 2243 12/31/19 0315   12/31/2019 2045  ceFEPIme (MAXIPIME) 2 g in sodium chloride 0.9 % 100 mL IVPB     2  g 200 mL/hr over 30 Minutes Intravenous  Once 12/11/2019 2039 12/31/2019 2215   12/28/2019 2045  metroNIDAZOLE (FLAGYL) IVPB 500 mg     500 mg 100 mL/hr over 60 Minutes Intravenous  Once 01/06/2020 2039 01/10/2020 2256   12/21/2019 2045  vancomycin (VANCOCIN) IVPB 1000 mg/200 mL premix     1,000 mg 200 mL/hr over 60 Minutes Intravenous  Once 12/27/2019 2039 12/25/2019 2215      Subjective: Cannot obtain given mentation  Objective: Vitals:   01/20/20 1339 01/20/20 1400 01/20/20 1430 01/20/20 1500  BP: (!) 173/74 (!) 160/87 119/64 101/62  Pulse: (!) 110 (!) 119 (!) 127 (!) 134  Resp: (!) 28 (!) 37 (!) 41 (!) 39  Temp:      TempSrc:      SpO2:      Weight:      Height:        Intake/Output Summary (Last 24 hours) at 01/20/2020 1558 Last  data filed at 01/19/2020 1600 Gross per 24 hour  Intake 500 ml  Output --  Net 500 ml   Filed Weights   01/18/20 0750 01/18/20 1059 01/19/20 0652  Weight: 73.4 kg 71.5 kg 73.5 kg    Examination: General exam: Not conversant, increased resp effort Respiratory system: normal chest rise, clear, no audible wheezing Cardiovascular system: regular rhythm, s1-s2 Gastrointestinal system: Nondistended, nontender, pos BS Central nervous system: No seizures, no tremors Extremities: No cyanosis, no joint deformities Skin: No rashes, no pallor Psychiatry: Unable to assess given mentation  Data Reviewed: I have personally reviewed following labs and imaging studies  CBC: Recent Labs  Lab 01/14/20 0359 01/17/20 0500 01/17/20 1500 01/18/20 0812  WBC 15.8* 17.8* 15.1* 16.3*  HGB 7.5* 8.0* 7.5* 7.7*  HCT 23.4* 25.3* 23.8* 23.9*  MCV 97.5 96.9 96.0 97.6  PLT 99* 185 173 629   Basic Metabolic Panel: Recent Labs  Lab 01/14/20 0359 01/15/20 0344 01/17/20 0500 01/18/20 0812  NA 138 140 137 137  K 3.6 3.6 4.1 3.6  CL 104 107 100 102  CO2 22 23 21* 21*  GLUCOSE 255* 239* 174* 366*  BUN 68* 86* 50* 74*  CREATININE 3.84* 4.57* 3.46* 4.73*  CALCIUM 7.5* 7.8* 7.7* 8.2*  MG 2.2 2.3  --   --   PHOS 3.7 4.3 4.4 5.7*   GFR: Estimated Creatinine Clearance: 12.3 mL/min (A) (by C-G formula based on SCr of 4.73 mg/dL (H)). Liver Function Tests: Recent Labs  Lab 01/14/20 0359 01/15/20 0344 01/17/20 0500 01/18/20 0812  ALBUMIN 1.1* 1.3* 1.4* 1.4*   No results for input(s): LIPASE, AMYLASE in the last 168 hours. No results for input(s): AMMONIA in the last 168 hours. Coagulation Profile: No results for input(s): INR, PROTIME in the last 168 hours. Cardiac Enzymes: No results for input(s): CKTOTAL, CKMB, CKMBINDEX, TROPONINI in the last 168 hours. BNP (last 3 results) No results for input(s): PROBNP in the last 8760 hours. HbA1C: No results for input(s): HGBA1C in the last 72  hours. CBG: Recent Labs  Lab 01/19/20 2351 01/20/20 0325 01/20/20 0726 01/20/20 1230 01/20/20 1551  GLUCAP 204* 259* 276* 234* 123*   Lipid Profile: No results for input(s): CHOL, HDL, LDLCALC, TRIG, CHOLHDL, LDLDIRECT in the last 72 hours. Thyroid Function Tests: No results for input(s): TSH, T4TOTAL, FREET4, T3FREE, THYROIDAB in the last 72 hours. Anemia Panel: No results for input(s): VITAMINB12, FOLATE, FERRITIN, TIBC, IRON, RETICCTPCT in the last 72 hours. Sepsis Labs: No results for input(s): PROCALCITON, LATICACIDVEN in the last  168 hours.  Recent Results (from the past 240 hour(s))  MRSA PCR Screening     Status: None   Collection Time: 01/11/20  9:53 AM   Specimen: Nasal Mucosa; Nasopharyngeal  Result Value Ref Range Status   MRSA by PCR NEGATIVE NEGATIVE Final    Comment:        The GeneXpert MRSA Assay (FDA approved for NASAL specimens only), is one component of a comprehensive MRSA colonization surveillance program. It is not intended to diagnose MRSA infection nor to guide or monitor treatment for MRSA infections. Performed at [facility], [place] [address] [place], [place] [address]   Respiratory Panel by RT PCR (Flu A&B, Covid) - Nasopharyngeal Swab     Status: None   Collection Time: 01/14/20  9:38 AM   Specimen: Nasopharyngeal Swab  Result Value Ref Range Status   SARS Coronavirus 2 by RT PCR NEGATIVE NEGATIVE Final    Comment: (NOTE) SARS-CoV-2 target nucleic acids are NOT DETECTED. The SARS-CoV-2 RNA is generally detectable in upper respiratoy specimens during the acute phase of infection. The lowest concentration of SARS-CoV-2 viral copies this assay can detect is 131 copies/mL. A negative result does not preclude SARS-Cov-2 infection and should not be used as the sole basis for treatment or other patient management decisions. A negative result may occur with  improper specimen collection/handling, submission of specimen other than  nasopharyngeal swab, presence of viral mutation(s) within the areas targeted by this assay, and inadequate number of viral copies (<131 copies/mL). A negative result must be combined with clinical observations, patient history, and epidemiological information. The expected result is Negative. Fact Sheet for Patients:  PinkCheek.be Fact Sheet for Healthcare Providers:  GravelBags.it This test is not yet ap proved or cleared by the [place] FDA and  has been authorized for detection and/or diagnosis of SARS-CoV-2 by FDA under an Emergency Use Authorization (EUA). This EUA will remain  in effect (meaning this test can be used) for the duration of the COVID-19 declaration under Section 564(b)(1) of the Act, 21 U.S.C. section 360bbb-3(b)(1), unless the authorization is terminated or revoked sooner.    Influenza A by PCR NEGATIVE NEGATIVE Final   Influenza B by PCR NEGATIVE NEGATIVE Final    Comment: (NOTE) The Xpert Xpress SARS-CoV-2/FLU/RSV assay is intended as an aid in  the diagnosis of influenza from Nasopharyngeal swab specimens and  should not be used as a sole basis for treatment. Nasal washings and  aspirates are unacceptable for Xpert Xpress SARS-CoV-2/FLU/RSV  testing. Fact Sheet for Patients: PinkCheek.be Fact Sheet for Healthcare Providers: GravelBags.it This test is not yet approved or cleared by the [place] FDA and  has been authorized for detection and/or diagnosis of SARS-CoV-2 by  FDA under an Emergency Use Authorization (EUA). This EUA will remain  in effect (meaning this test can be used) for the duration of the  Covid-19 declaration under Section 564(b)(1) of the Act, 21  U.S.C. section 360bbb-3(b)(1), unless the authorization is  terminated or revoked. Performed at [facility], [place] [address] [place], [place] [address]       Radiology Studies: DG CHEST PORT 1 VIEW  Result Date: 01/20/2020 CLINICAL DATA:  End-stage renal disease. EXAM: PORTABLE CHEST 1 VIEW COMPARISON:  January 18, 2020 FINDINGS: Support apparatus is stable. No pneumothorax. There is infiltrate left perihilar region, more pronounced in the interval. The lungs are otherwise clear. The cardiomediastinal silhouette is stable. IMPRESSION: There is opacity in left perihilar region, more  pronounced in the interval. This could represent pneumonia or atelectasis. Recommend short-term follow-up to ensure resolution. Electronically Signed   By: Dorise Bullion III M.D   On: 01/20/2020 15:00    Scheduled Meds: . Chlorhexidine Gluconate Cloth  6 each Topical Daily  . Chlorhexidine Gluconate Cloth  6 each Topical Q0600  . Chlorhexidine Gluconate Cloth  6 each Topical Q0600  . darbepoetin (ARANESP) injection - DIALYSIS  200 mcg Intravenous Q Thu-HD  . heparin  2,000 Units Dialysis Once in dialysis  . insulin aspart  0-6 Units Subcutaneous Q4H  . insulin aspart  4 Units Subcutaneous Q4H  . mouth rinse  15 mL Mouth Rinse q12n4p  . methylPREDNISolone (SOLU-MEDROL) injection  40 mg Intravenous BID  . metoprolol tartrate  10 mg Intravenous Q6H  . multivitamin  1 tablet Per Tube QHS  . pantoprazole (PROTONIX) IV  40 mg Intravenous Q0600  . white petrolatum   Topical BID   Continuous Infusions: . feeding supplement (NEPRO CARB STEADY) 1,000 mL (01/20/20 1535)     LOS: 21 days   Marylu Lund, MD [facility] Pager On Amion  If 7PM-7AM, please contact night-coverage 01/20/2020, 3:58 PM

## 2020-01-20 NOTE — Significant Event (Signed)
Rapid Response Event Note  Overview: Acute Respiratory Distress with hypoxia, sats 60%  Initial Focused Assessment: Notified by nursing staff of pt with acute desaturation to 60% and increased WOB, RR 32-40. Pt immediately placed on NRB mask at 15L. Upon my arrival, pt in severe distress with accessory muscle use and retractions. HFNC 15L added in addition to NRB. Pt remains tachypneic RR 36 and sats 66%. Patricia [name] APP notified and BIPAP ordered with known aspiration pna and poor skin integrity around her mouth. NTS x2 successful passes with thick tenacious tan secretions prior to BIPAP placed. BBS Coarse Rhonchi with worse L than R.  Stat PCXR ordered and performed. PCXR shows complete white out of Left lung. Pt placed on BIPAP 16/12 at 100%. RR 28 TV 450 and sats 65-70%. Hr 122 ST, BP 161/80. Skin cool, cap refil greater than 3 secs and diaphoretic. Nursing staff contacted NOK to inform of events and requested them to return to bedside.    Interventions: -STAT PCXR -STAT NTS -NRB/HFNC then BIPAP -Pt turned to right side  Plan of Care: -Aspiration precautions -Notify primary svc and/or RRRN for further assistance -If family arrives and escalation of care is requested, notify primary svc and RRRN  Event Summary: Call received 2106 Arrived 2108 Call ended [phone]  Madelynn Done

## 2020-01-20 NOTE — Treatment Plan (Signed)
Patient with RR in the 30-40's. CXR reviewed, concern of pna. Given recent SLP eval outlining "severe aspiration risk," have concern that pt is aspirating. Have stopped tube feeding and started empiric zosyn. Discussed with both daughters over phone who are OK to give trial of low dose morphine to assist with air hunger.

## 2020-01-20 NOTE — Progress Notes (Signed)
Patient sats are 68% on Bipap, Shanon Brow is staying with the patient.

## 2020-01-20 NOTE — Progress Notes (Signed)
Notified Blount, NP of sats remaining 77-83% on bipap

## 2020-01-20 NOTE — Progress Notes (Signed)
Rapid Response called for Respiratory Distress.  Patient has increased WOB, RR 36, mews score 5, sats dropped to 67%.  Kennon Holter, NP, Shanon Brow, Rapid Nurse, and Estill Bamberg, RT notified.  Immediately increased oxygen to NRB  & 15L HFNC.  Sats continue to remain low, patient placed on bipap and turned patient to the right side, as evidenced by the left lung having white out on chest xray. Family notified and arrived at bedside.

## 2020-01-21 DIAGNOSIS — Z789 Other specified health status: Secondary | ICD-10-CM | POA: Diagnosis not present

## 2020-01-21 DIAGNOSIS — I469 Cardiac arrest, cause unspecified: Secondary | ICD-10-CM | POA: Diagnosis not present

## 2020-01-21 DIAGNOSIS — R0603 Acute respiratory distress: Secondary | ICD-10-CM | POA: Diagnosis not present

## 2020-01-21 DIAGNOSIS — G931 Anoxic brain damage, not elsewhere classified: Secondary | ICD-10-CM | POA: Diagnosis not present

## 2020-01-21 DIAGNOSIS — N186 End stage renal disease: Secondary | ICD-10-CM | POA: Diagnosis not present

## 2020-01-21 DIAGNOSIS — Z515 Encounter for palliative care: Secondary | ICD-10-CM | POA: Diagnosis not present

## 2020-01-21 DIAGNOSIS — Z978 Presence of other specified devices: Secondary | ICD-10-CM | POA: Diagnosis not present

## 2020-01-21 LAB — GLUCOSE, CAPILLARY
Glucose-Capillary: 147 mg/dL — ABNORMAL HIGH (ref 70–99)
Glucose-Capillary: 166 mg/dL — ABNORMAL HIGH (ref 70–99)
Glucose-Capillary: 190 mg/dL — ABNORMAL HIGH (ref 70–99)
Glucose-Capillary: 203 mg/dL — ABNORMAL HIGH (ref 70–99)
Glucose-Capillary: 216 mg/dL — ABNORMAL HIGH (ref 70–99)
Glucose-Capillary: 244 mg/dL — ABNORMAL HIGH (ref 70–99)

## 2020-01-21 LAB — PROCALCITONIN: Procalcitonin: 6.12 ng/mL

## 2020-01-21 MED ORDER — HYDROMORPHONE HCL 1 MG/ML IJ SOLN
0.5000 mg | INTRAMUSCULAR | Status: DC | PRN
  Administered 2020-01-21: 0.5 mg via INTRAVENOUS
  Filled 2020-01-21: qty 1

## 2020-01-21 NOTE — Progress Notes (Signed)
Patient ID: Patricia [name], female   DOB: [birth year], 64 y.o.   MRN: [id number]  [facility] Progress Note  Outpt HD: [facility] TTS    400/800  62kg  Hep 1900  R IJ TDC  - mircera 225 q2wks , last 3/18  - calc tiw  Assessment/ Plan:   1.  Cardiac arrest: out-of-hospital arrest 01/03/2020 at end of HD, CPR 29min, ROSC in ED. Recurrent cardiac arrest on 3/28 PEA. Nothing reversible found on imaging or labs. Pt is DNR, pt survived extubation on 4/5.   2. Acute hypoxic respiratory failure - resolved, but worse again now w/ L white-out by CXR yest 4/10 d/t suspected aspiration. Started IV zosyn and TF's held yesterday. Is requiring bipap now w/ sig hypoxia, sats around 70- 85%.    3. ESRD - on HD TTS. Had HD yesterday. Next HD Tuesday if stable by then.   4. HTN / volume  - has very labile BP's, high at start of HD but drops low after 1-2 hrs, +vol excess but not able to UF due to labile BP's  5. Anoxic encephalopathy / brain injury - post arrest; low chance of meaningful recovery per neuro - patient would not want trach/PEG/LTACH per family  - pt is DNR, cont other medical support for now - pall care following    7.  COVID-19 infection: Incidental finding on admit, s/p remdesivir. Pt no longer needs airborne precautions.    Kelly Splinter, MD 01/21/2020, 12:50 PM       Subjective:   Pt not responsive seen in room on bipap     Objective:   BP (!) 170/66 (BP Location: Right Arm)   Pulse 81   Temp 97.9 F (36.6 C) (Axillary)   Resp (!) 24   Ht 5\' 4"  (1.626 m)   Wt 73.5 kg   SpO2 100%   BMI 27.81 kg/m   Physical Exam: General adult female in bed, head tilted up and to the left, not tracking or responding HEENT normocephalic atraumatic  Lungs coarse mechanical breath sounds; vent 30/5 Heart S1S2 no rub Extremities 2+ edema x 4 ext Neuro - not following commands  Access right chest tunneled catheter   Labs: BMET Recent Labs  Lab 01/15/20 0344 01/17/20 0500  01/18/20 0812  NA 140 137 137  K 3.6 4.1 3.6  CL 107 100 102  CO2 23 21* 21*  GLUCOSE 239* 174* 366*  BUN 86* 50* 74*  CREATININE 4.57* 3.46* 4.73*  CALCIUM 7.8* 7.7* 8.2*  PHOS 4.3 4.4 5.7*   CBC Recent Labs  Lab 01/17/20 0500 01/17/20 1500 01/18/20 0812  WBC 17.8* 15.1* 16.3*  HGB 8.0* 7.5* 7.7*  HCT 25.3* 23.8* 23.9*  MCV 96.9 96.0 97.6  PLT 185 173 188      Medications:    . Chlorhexidine Gluconate Cloth  6 each Topical Q0600  . darbepoetin (ARANESP) injection - DIALYSIS  200 mcg Intravenous Q Thu-HD  . insulin aspart  0-6 Units Subcutaneous Q4H  . insulin aspart  4 Units Subcutaneous Q4H  . mouth rinse  15 mL Mouth Rinse q12n4p  . methylPREDNISolone (SOLU-MEDROL) injection  40 mg Intravenous BID  . metoprolol tartrate  10 mg Intravenous Q6H  . multivitamin  1 tablet Per Tube QHS  . pantoprazole (PROTONIX) IV  40 mg Intravenous Q0600  . white petrolatum   Topical BID

## 2020-01-21 NOTE — Progress Notes (Signed)
PROGRESS NOTE    Patricia [name]  ZOX:[id number] DOB: May 26, 1956 DOA: 12/12/2019 PCP: Ladell Pier, MD    Brief Narrative:  [id number] with hx ESRD on HD, chronic systolic CHF, HTN, DM2 who was admitted for cardiac arrest. Pt was intubated in the ED and admitted to [facility] service. Pt underwent hypothermic protocol and later developed hypotension with PEA arrest. After family meeting, decision was made for one-way extubation on 4/5 with transfer to [place]:   Active Problems:   ESRD (end stage renal disease) on dialysis [facility])   Cardiac arrest [facility])   Palliative care by specialist   DNR (do not resuscitate)   Endotracheally intubated   Anoxic brain damage ([facility])   Pressure injury of skin   Dysphagia   Altered mental status   Acute respiratory distress  Cardiac arrest: Initial out to facility on 3/20-followed by repeat cardiac arrest on 3/28.  Concerns of anoxic brain injury resulting from cardiac arrest as well as metabolic encephalopathy.  Echo with decreased EF-given poor prognosis-doubt further work-up will change management or outcome. Palliative care following. Continuing to follow for any signs of recovery. See below, at this time, prognosis is very poor  Anoxic brain injury: Had been seen by neurology-who do not see a meaningful chance for recovery. Since transfer from ICU, there has been no significant improvement in mentation. Still unable to follow simple commands  Toxic metabolic encephalopathy: EEG/MRI as above-supportive care continues.  ?CVA: Echo/carotid ultrasound performed. Per Neurology, more concerns of hypoxic/anoxic brain injury with stroke likely resultant of either small vessel disease or embolic infarct in the setting of cardiac arrest. Neurology recommends discussing with family regarding the possibility of an irreversible brain injury and grim chances for neurologically meaningful recovery  Dysphagia: Secondary to anoxic brain injury-Dr. Sloan Leiter  discussed with palliative care team-who spoke with family --[name] was placed initially for nutrition. See below. Given concerns of aspiration, have since held further tube feeding. Family aware that PEG will not eliminate risk of aspiration. Per SLP patient has "Severe aspiration risk"  Acute hypoxic respiratory failure in the setting of cardiac arrest: Extubated on 4/5 -Recently noted to have markedly increased resp effort and worsened hypoxemic failure -Serial CXR reviewed. Concern for rapidly worsening L opacity -Rapid response called overnight and patient now BiPAP dependent -Given concerns of possible re-aspiration, pt on zosyn  Chronic systolic heart failure:  - Deferred to Nephrology for diuresis with HD.  Echo as above.  COVID-19 infection: Completed steroids/remdesivir.  Per ID note (on 4/4)-patient is no longer thought to be infectious-airborne/isolation discontinued.  ESRD: Renal following-dialysis per nephrology. On HD as tolerated  HTN: Uncontrolled-not getting Coreg as patient does not have the NG tube. Pt is continued on scheduled IV Lopressor  Anemia: Multifactorial-acute illness-superimposed on ESRD related anemia.  Transfuse if hemoglobin less than 5.  Acute blood loss anemia: blood noted in stools this AM. Hgb this AM is up to 8.0 from prior value of 7.5. Will hold ASA and heparin subq. Following CBC trends  Thrombocytopenia: Appears improved from prior  Aspiration pneumonia: Had previously completed a course of antibiotics. See above. Worsening hypoxemic failure with now white-out of L lung and bipap dependent -Currently on zosyn and tube feeding held -Per SLP, pt has "Severe aspiration risk" -Family at bedside aware that feeding tubes do not eliminate risk for aspiration  Palliative care/goals of care: DNR in place -Palliative Care following -Discussed with Palliative Care. Current plan to continue BiPAP with no escalation  of care -Focus on pt's  comfort. See previous note, pt did appear more comfortable with low dose morphine yesterday x 1 dose  -Low dose dilaudid PRN pain or sob per Palliative Care  Nutrition Problem: Nutrition Problem: Inadequate oral intake Etiology: acute illness Had been continued on tube feeding via [name], now held given concerns of PNA  Obesity: Estimated body mass index is 27.18kg/m as calculated from the following:   Height as of this encounter: 5\' 4"  (1.626 m).   Weight as of this encounter: 73.5 kg.   Tachycardia -IV lopressor dose recently increased to 10mg  q6hrs -HR currently stable  Pressure sores -WOC had seen and evaluated patient while in ICU regarding full thickness lip pressure sore from ETT tube  -Would involve WOC for any updated recommendations  DVT prophylaxis: SCD's, hold heparin Code Status: DNR Family Communication: Pt in room, family at bedside  Status is: Inpatient  Remains inpatient appropriate because:Hemodynamically unstable   Dispo: The patient is from: Home              Anticipated d/c is to: Uncertain at this time given severity of illness, pending f/u by Palliative Care              Anticipated d/c date is: > 3 days              Patient currently is not medically stable to d/c.         Consultants:   PCCM   Neurology  Palliative Care  Nephrology  WOC  Procedures:  3/20>>4/5 ET tube 3/20 CVC > 3/24 11/23/19: R IJ tunneled perm cath:   Antimicrobials: Anti-infectives (From admission, onward)   Start     Dose/Rate Route Frequency Ordered Stop   01/20/20 1800  piperacillin-tazobactam (ZOSYN) IVPB 2.25 g     2.25 g 100 mL/hr over 30 Minutes Intravenous Every 8 hours 01/20/20 1647     01/11/20 1430  meropenem (MERREM) 500 mg in sodium chloride 0.9 % 100 mL IVPB     500 mg 200 mL/hr over 30 Minutes Intravenous Every 24 hours 01/11/20 0803 01/13/20 1509   01/10/20 2040  vancomycin variable dose per unstable renal function (pharmacist  dosing)  Status:  Discontinued      Does not apply See admin instructions 01/10/20 2040 01/11/20 1748   01/10/20 1200  vancomycin (VANCOCIN) IVPB 750 mg/150 ml premix  Status:  Discontinued     750 mg 150 mL/hr over 60 Minutes Intravenous Every 24 hours 01/09/20 1218 01/10/20 2040   01/09/20 1400  meropenem (MERREM) 1 g in sodium chloride 0.9 % 100 mL IVPB  Status:  Discontinued     1 g 200 mL/hr over 30 Minutes Intravenous Every 8 hours 01/09/20 1218 01/10/20 2040   01/09/20 1200  vancomycin (VANCOCIN) IVPB 750 mg/150 ml premix  Status:  Discontinued     750 mg 150 mL/hr over 60 Minutes Intravenous Every T-Th-Sa (Hemodialysis) 01/07/20 1536 01/09/20 1218   01/07/20 1545  vancomycin (VANCOREADY) IVPB 1500 mg/300 mL     1,500 mg 150 mL/hr over 120 Minutes Intravenous  Once 01/07/20 1536 01/07/20 1754   01/07/20 1545  meropenem (MERREM) 500 mg in sodium chloride 0.9 % 100 mL IVPB  Status:  Discontinued     500 mg 200 mL/hr over 30 Minutes Intravenous Daily at bedtime 01/07/20 1536 01/09/20 1218   01/03/20 1000  piperacillin-tazobactam (ZOSYN) IVPB 3.375 g  Status:  Discontinued     3.375 g 12.5 mL/hr over 240  Minutes Intravenous Every 12 hours 01/03/20 0822 01/07/20 1539   01/02/20 1800  ceFEPIme (MAXIPIME) 2 g in sodium chloride 0.9 % 100 mL IVPB  Status:  Discontinued     2 g 200 mL/hr over 30 Minutes Intravenous Every T-Th-Sa (1800) 01/10/2020 2129 01/01/20 0844   01/02/20 1200  vancomycin (VANCOCIN) IVPB 750 mg/150 ml premix  Status:  Discontinued     750 mg 150 mL/hr over 60 Minutes Intravenous Every T-Th-Sa (Hemodialysis) 12/14/2019 2129 01/01/20 0844   12/31/19 1000  remdesivir 100 mg in sodium chloride 0.9 % 100 mL IVPB     100 mg 200 mL/hr over 30 Minutes Intravenous Daily 12/16/2019 2243 01/03/20 0933   12/12/2019 2330  remdesivir 200 mg in sodium chloride 0.9% 250 mL IVPB     200 mg 580 mL/hr over 30 Minutes Intravenous Once 12/18/2019 2243 12/31/19 0315   01/08/2020 2045  ceFEPIme  (MAXIPIME) 2 g in sodium chloride 0.9 % 100 mL IVPB     2 g 200 mL/hr over 30 Minutes Intravenous  Once 12/11/2019 2039 12/13/2019 2215   12/11/2019 2045  metroNIDAZOLE (FLAGYL) IVPB 500 mg     500 mg 100 mL/hr over 60 Minutes Intravenous  Once 01/10/2020 2039 12/14/2019 2256   01/08/2020 2045  vancomycin (VANCOCIN) IVPB 1000 mg/200 mL premix     1,000 mg 200 mL/hr over 60 Minutes Intravenous  Once 12/12/2019 2039 01/07/2020 2215      Subjective: Unable to assess this AM given mentation. Overnight, pt had worsening sob, now on bipap  Objective: Vitals:   01/21/20 0906 01/21/20 1144 01/21/20 1215 01/21/20 1443  BP:   (!) 170/66   Pulse: 71 73 81 78  Resp: 17 15 (!) 24 17  Temp:   97.9 F (36.6 C)   TempSrc:   Axillary   SpO2: 100% 100% 100% 100%  Weight:      Height:        Intake/Output Summary (Last 24 hours) at 01/21/2020 1600 Last data filed at 01/21/2020 0300 Gross per 24 hour  Intake 100 ml  Output 1126 ml  Net -1026 ml   Filed Weights   01/18/20 0750 01/18/20 1059 01/19/20 0652  Weight: 73.4 kg 71.5 kg 73.5 kg    Examination: General exam: Not conversant, in mild resp distress this AM Respiratory system: increased resp effort, on bipap, decreased BS Cardiovascular system: regular rhythm, s1-s2 Gastrointestinal system: Nondistended, nontender, pos BS Central nervous system: No seizures, no tremors Extremities: No cyanosis, no joint deformities Skin: No rashes, no pallor, upper lip with full thickness pressure sore Psychiatry: unable to assess given current mentation  Data Reviewed: I have personally reviewed following labs and imaging studies  CBC: Recent Labs  Lab 01/17/20 0500 01/17/20 1500 01/18/20 0812  WBC 17.8* 15.1* 16.3*  HGB 8.0* 7.5* 7.7*  HCT 25.3* 23.8* 23.9*  MCV 96.9 96.0 97.6  PLT 185 173 419   Basic Metabolic Panel: Recent Labs  Lab 01/15/20 0344 01/17/20 0500 01/18/20 0812  NA 140 137 137  K 3.6 4.1 3.6  CL 107 100 102  CO2 23 21* 21*   GLUCOSE 239* 174* 366*  BUN 86* 50* 74*  CREATININE 4.57* 3.46* 4.73*  CALCIUM 7.8* 7.7* 8.2*  MG 2.3  --   --   PHOS 4.3 4.4 5.7*   GFR: Estimated Creatinine Clearance: 12.3 mL/min (A) (by C-G formula based on SCr of 4.73 mg/dL (H)). Liver Function Tests: Recent Labs  Lab 01/15/20 0344 01/17/20 0500 01/18/20 3790  ALBUMIN 1.3* 1.4* 1.4*   No results for input(s): LIPASE, AMYLASE in the last 168 hours. No results for input(s): AMMONIA in the last 168 hours. Coagulation Profile: No results for input(s): INR, PROTIME in the last 168 hours. Cardiac Enzymes: No results for input(s): CKTOTAL, CKMB, CKMBINDEX, TROPONINI in the last 168 hours. BNP (last 3 results) No results for input(s): PROBNP in the last 8760 hours. HbA1C: No results for input(s): HGBA1C in the last 72 hours. CBG: Recent Labs  Lab 01/20/20 1941 01/20/20 2335 01/21/20 0331 01/21/20 0817 01/21/20 1155  GLUCAP 272* 197* 166* 147* 190*   Lipid Profile: No results for input(s): CHOL, HDL, LDLCALC, TRIG, CHOLHDL, LDLDIRECT in the last 72 hours. Thyroid Function Tests: No results for input(s): TSH, T4TOTAL, FREET4, T3FREE, THYROIDAB in the last 72 hours. Anemia Panel: Recent Labs    01/20/20 1732  FERRITIN 6,975*   Sepsis Labs: Recent Labs  Lab 01/20/20 1732 01/21/20 0500  PROCALCITON 5.63 6.12    Recent Results (from the past 240 hour(s))  Respiratory Panel by RT PCR (Flu A&B, Covid) - Nasopharyngeal Swab     Status: None   Collection Time: 01/14/20  9:38 AM   Specimen: Nasopharyngeal Swab  Result Value Ref Range Status   SARS Coronavirus 2 by RT PCR NEGATIVE NEGATIVE Final    Comment: (NOTE) SARS-CoV-2 target nucleic acids are NOT DETECTED. The SARS-CoV-2 RNA is generally detectable in upper respiratoy specimens during the acute phase of infection. The lowest concentration of SARS-CoV-2 viral copies this assay can detect is 131 copies/mL. A negative result does not preclude SARS-Cov-2  infection and should not be used as the sole basis for treatment or other patient management decisions. A negative result may occur with  improper specimen collection/handling, submission of specimen other than nasopharyngeal swab, presence of viral mutation(s) within the areas targeted by this assay, and inadequate number of viral copies (<131 copies/mL). A negative result must be combined with clinical observations, patient history, and epidemiological information. The expected result is Negative. Fact Sheet for Patients:  PinkCheek.be Fact Sheet for Healthcare Providers:  GravelBags.it This test is not yet ap proved or cleared by the [place] FDA and  has been authorized for detection and/or diagnosis of SARS-CoV-2 by FDA under an Emergency Use Authorization (EUA). This EUA will remain  in effect (meaning this test can be used) for the duration of the COVID-19 declaration under Section 564(b)(1) of the Act, 21 U.S.C. section 360bbb-3(b)(1), unless the authorization is terminated or revoked sooner.    Influenza A by PCR NEGATIVE NEGATIVE Final   Influenza B by PCR NEGATIVE NEGATIVE Final    Comment: (NOTE) The Xpert Xpress SARS-CoV-2/FLU/RSV assay is intended as an aid in  the diagnosis of influenza from Nasopharyngeal swab specimens and  should not be used as a sole basis for treatment. Nasal washings and  aspirates are unacceptable for Xpert Xpress SARS-CoV-2/FLU/RSV  testing. Fact Sheet for Patients: PinkCheek.be Fact Sheet for Healthcare Providers: GravelBags.it This test is not yet approved or cleared by the [place] FDA and  has been authorized for detection and/or diagnosis of SARS-CoV-2 by  FDA under an Emergency Use Authorization (EUA). This EUA will remain  in effect (meaning this test can be used) for the duration of the  Covid-19 declaration  under Section 564(b)(1) of the Act, 21  U.S.C. section 360bbb-3(b)(1), unless the authorization is  terminated or revoked. Performed at [facility], [place] [address] [place], [place] [address]  Radiology Studies: DG Chest Port 1 View  Result Date: 01/20/2020 CLINICAL DATA:  64 year old female with rapid response and shortness of breath. EXAM: PORTABLE CHEST 1 VIEW COMPARISON:  Chest radiograph dated 1421. FINDINGS: Dialysis catheter, right IJ central venous line, and partially visualized feeding tube extending below the diaphragm as seen previously. There is opacification of the majority of the left lung significantly progressed since the prior radiograph. A small left pleural effusion noted. There is overall loss of left lung volume with shift of the mediastinum into the left hemithorax indicative of atelectasis or collapse. The right lung is clear. There is no pneumothorax. Atherosclerotic calcification of the aorta. No acute osseous pathology. IMPRESSION: 1. Opacification of the majority of the left lung most consistent with atelectasis or collapse. There is associated decreased left lung volume and shift of the mediastinum into the left hemithorax. 2. Small left pleural effusion. Electronically Signed   By: Anner Crete M.D.   On: 01/20/2020 21:42   DG CHEST PORT 1 VIEW  Result Date: 01/20/2020 CLINICAL DATA:  End-stage renal disease. EXAM: PORTABLE CHEST 1 VIEW COMPARISON:  January 18, 2020 FINDINGS: Support apparatus is stable. No pneumothorax. There is infiltrate left perihilar region, more pronounced in the interval. The lungs are otherwise clear. The cardiomediastinal silhouette is stable. IMPRESSION: There is opacity in left perihilar region, more pronounced in the interval. This could represent pneumonia or atelectasis. Recommend short-term follow-up to ensure resolution. Electronically Signed   By: Dorise Bullion III M.D   On: 01/20/2020 15:00    Scheduled Meds: .  Chlorhexidine Gluconate Cloth  6 each Topical Q0600  . darbepoetin (ARANESP) injection - DIALYSIS  200 mcg Intravenous Q Thu-HD  . insulin aspart  0-6 Units Subcutaneous Q4H  . insulin aspart  4 Units Subcutaneous Q4H  . mouth rinse  15 mL Mouth Rinse q12n4p  . methylPREDNISolone (SOLU-MEDROL) injection  40 mg Intravenous BID  . metoprolol tartrate  10 mg Intravenous Q6H  . multivitamin  1 tablet Per Tube QHS  . pantoprazole (PROTONIX) IV  40 mg Intravenous Q0600  . white petrolatum   Topical BID   Continuous Infusions: . feeding supplement (NEPRO CARB STEADY) 1,000 mL (01/20/20 1535)  . piperacillin-tazobactam (ZOSYN)  IV 2.25 g (01/21/20 0948)     LOS: 22 days   Marylu Lund, MD [facility] Pager On Amion  If 7PM-7AM, please contact night-coverage 01/21/2020, 4:00 PM

## 2020-01-21 NOTE — Progress Notes (Signed)
During shift change with RN Justice Rocher, MD Wyline Copas called for update. RN mentioned blood in patients stool, via flexiseal. The blood has been occurring for day. No new orders at this time.

## 2020-01-21 NOTE — Progress Notes (Signed)
Daily Progress Note   Patient Name: Patricia [name]       Date: 01/21/2020 DOB: 1955-12-26  Age: 64 y.o. MRN#: [id number] Attending Physician: Donne Hazel, MD Primary Care Physician: Ladell Pier, MD Admit Date: 12/21/2019  Reason for Consultation/Follow-up: Establishing goals of care  Subjective: Met with Patricia [name] and Patricia [name] at bedside and then in a room across the hallway.  They understand she is in respiratory distress and dependent on Bipap.  Patricia [name] is a Christian with a very strong faith and a strong determined will.  She is a Nurse, adult.  Her daughters have had a roller coaster ride for the past 3 weeks.  I imagine they are completely exhausted.  They feel as though they are being pushed to give answers to impossible questions.   My mother will never give up and we will never give up on her.    Patricia [name] explains that they know she would never want a PEG tube and she would never want to live in a SNF.  Patricia [name] expresses that they wish they could have her input now.  These two daughters are struggling trying to read the situation and interpret their mother's wishes.  They feel as though they are being pushed by the medical team to make impossible decisions.   They expressed more than once that they want God to make this decision for them.    Both daughters have years of hands on patient experience.  They are extremely upset about the sacral wound she has developed during this hospitalization and the wound to her lip.  They showed me pictures of what she looked like just 1 month ago.  She was a lovely person.  We agreed that God is in control and he will take her when it is her time.  In the mean time it is our job to take the best possible care of her while she is with [place].  Supporting her in a  medically appropriate fashion while attending to her comfort and dignity as much as possible.  Sadly I talked to the daughters about her frailty and weakness.  I believe her spirit remains very strong but her body has been thru so much and has become so fragile.   I expressed that I'm very concerned that even with full scale medical  intervention (strong Bipap that she is on now) there is only more suffering.  Things get worse and harder from here.  We discussed the morphine that she received.  They daughters want her to be comfortable and not to suffer.    They asked me to pray for their mother and I committed to do so.   Assessment: 57 yof suffered OOH cardiac arrest and a second arrest inpatient approximately 1 week later.  She was compassionately extubated and has survived.  She has now aspirated and is awake on high strength Bipap - unable to eat or speak.  She continues to receive HD although now on Bipap I'm not certain she can take HD.   Patient Profile/HPI:  Patient with a past medical history significant for end-stage renal disease/on hemodialysis, chronic systolic heart failure, hypertension, diabetes mellitus who was admitted on 12/27/2019 with an out of facility cardiac arrest.  Family initiated CPR, EMS performed 15 minutes of CPR with return of ROSC.  Patient was intubated in the emergency room, she under went hypothermic protocol but unfortunately on 01-07-27-21 developed hypotension and had a PEA arrest.  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  Decision was made with family for a one-way extubation on 01-15-20.      Length of Stay: 22  Current Medications: Scheduled Meds:  . Chlorhexidine Gluconate Cloth  6 each Topical Q0600  . darbepoetin (ARANESP) injection - DIALYSIS  200 mcg Intravenous Q Thu-HD  . insulin aspart  0-6 Units Subcutaneous Q4H  . insulin aspart  4 Units Subcutaneous Q4H  . mouth rinse  15 mL Mouth Rinse q12n4p  .  methylPREDNISolone (SOLU-MEDROL) injection  40 mg Intravenous BID  . metoprolol tartrate  10 mg Intravenous Q6H  . multivitamin  1 tablet Per Tube QHS  . pantoprazole (PROTONIX) IV  40 mg Intravenous Q0600  . white petrolatum   Topical BID    Continuous Infusions: . feeding supplement (NEPRO CARB STEADY) 1,000 mL (01/20/20 1535)  . piperacillin-tazobactam (ZOSYN)  IV 2.25 g (01/21/20 0948)    PRN Meds: acetaminophen **OR** acetaminophen, diphenhydrAMINE, heparin, HYDROmorphone (DILAUDID) injection, ipratropium-albuterol, labetalol, labetalol, polyvinyl alcohol    Vital Signs: BP (!) 170/66 (BP Location: Right Arm)   Pulse 81   Temp 97.9 F (36.6 C) (Axillary)   Resp (!) 24   Ht 5' 4"  (1.626 m)   Wt 73.5 kg   SpO2 100%   BMI 27.81 kg/m  SpO2: SpO2: 100 % O2 Device: O2 Device: Bi-PAP O2 Flow Rate: O2 Flow Rate (L/min): 4 L/min  Intake/output summary:   Intake/Output Summary (Last 24 hours) at 01/21/2020 1436 Last data filed at 01/21/2020 0300 Gross per 24 hour  Intake 100 ml  Output 1126 ml  Net -1026 ml   LBM: Last BM Date: 01/21/20(Blood in stool) Baseline Weight: Weight: 62.6 kg Most recent weight: Weight: (floor to weight)       Palliative Assessment/Data: 10%      Patient Active Problem List   Diagnosis Date Noted  . Altered mental status   . Dysphagia   . Pressure injury of skin 01/15/2020  . Palliative care by specialist   . DNR (do not resuscitate)   . Endotracheally intubated   . Anoxic brain damage (HCC)   . QT prolongation   . Cardiac arrest ([place]) 01/03/2020  . Major depressive disorder, recurrent severe without psychotic features ([place]) 11/20/2019  . Generalized weakness 11/19/2019  . Acute encephalopathy 11/18/2019  . Sigmoid diverticulitis 10/10/2019  .  ESRD on peritoneal dialysis ([place]) 07/06/2019  . Hypokalemia 06/16/2019  . Periumbilical hernia 30/81/6838  . ESRD (end stage renal disease) on dialysis ([place]) 01/20/2019  . Chronic combined  systolic and diastolic CHF (congestive heart failure) ([place]) 12/06/2018  . Fluid overload 12/05/2018  . Type II diabetes mellitus with renal manifestations ([place]) 12/05/2018  . Hypoglycemia 12/05/2018  . Acute on chronic combined systolic and diastolic CHF (congestive heart failure) ([place]) 06/14/2018  . CKD (chronic kidney disease) stage 5, GFR less than 15 ml/min (HCC) 06/14/2018  . Anemia due to stage 5 chronic kidney disease, not on chronic dialysis ([place]) 06/14/2018  . Systolic CHF ([place]) 70/65/8260  . Diabetes mellitus type 2 with complications ([place]) 88/83/5844  . Transaminasemia 11/29/2016  . Cardiomyopathy ([place]) 11/12/2016  . Lipoma 10/21/2016  . CKD (chronic kidney disease) stage 4, GFR 15-29 ml/min (HCC) 08/10/2016  . Anxiety and depression 12/12/2015  . Diabetic nephropathy associated with type 2 diabetes mellitus ([place]) 11/15/2014  . Moderate nonproliferative diabetic retinopathy(362.05) 04/17/2014  . 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 Hypertension associated with diabetes ([place]) 03/28/2014  . Dental caries 03/28/2014  . Dyslipidemia 04/18/2013    Palliative Care Plan    Recommendations/Plan:  Focus on her dignity and comfort.  Bipap stays in place at this point.    Escalation of interventions is not helpful to her quality of life.  Do not escalate interventions.  Needs wound care to sacrum as lip (if possible)  Attend to patient's comfort.  Low dose dilaudid PRN for pain or SOB.  PMT will continue to follow.  Chaplain for support and encouragement.  Code Status:  DNR  Prognosis:   Hours - Days   Discharge Planning:  Anticipated Hospital Death  Care plan was discussed with attending MD and family.  Thank you for allowing the Palliative Medicine Team to assist in the care of this patient.  Total time spent:  35 min     Greater than 50%  of this time was spent counseling and coordinating care related to the above assessment and plan.  Florentina Jenny, PA-C Palliative Medicine  Please contact Palliative MedicineTeam phone at [phone] for questions and concerns between 7 am - 7 pm.   Please see AMION for individual provider pager numbers.

## 2020-01-22 DIAGNOSIS — Z789 Other specified health status: Secondary | ICD-10-CM | POA: Diagnosis not present

## 2020-01-22 DIAGNOSIS — Z515 Encounter for palliative care: Secondary | ICD-10-CM | POA: Diagnosis not present

## 2020-01-22 DIAGNOSIS — Z66 Do not resuscitate: Secondary | ICD-10-CM | POA: Diagnosis not present

## 2020-01-22 DIAGNOSIS — Z978 Presence of other specified devices: Secondary | ICD-10-CM | POA: Diagnosis not present

## 2020-01-22 DIAGNOSIS — G931 Anoxic brain damage, not elsewhere classified: Secondary | ICD-10-CM | POA: Diagnosis not present

## 2020-01-22 LAB — GLUCOSE, CAPILLARY
Glucose-Capillary: 151 mg/dL — ABNORMAL HIGH (ref 70–99)
Glucose-Capillary: 141 mg/dL — ABNORMAL HIGH (ref 70–99)
Glucose-Capillary: 106 mg/dL — ABNORMAL HIGH (ref 70–99)
Glucose-Capillary: 64 mg/dL — ABNORMAL LOW (ref 70–99)
Glucose-Capillary: 103 mg/dL — ABNORMAL HIGH (ref 70–99)
Glucose-Capillary: 118 mg/dL — ABNORMAL HIGH (ref 70–99)
Glucose-Capillary: 187 mg/dL — ABNORMAL HIGH (ref 70–99)

## 2020-01-22 LAB — PROCALCITONIN: Procalcitonin: 4.83 ng/mL

## 2020-01-22 MED ORDER — DEXTROSE-NACL 5-0.9 % IV SOLN: INTRAVENOUS | Status: DC

## 2020-01-22 MED ORDER — DEXTROSE 50 % IV SOLN
INTRAVENOUS | Status: AC
  Administered 2020-01-22: 25 mL
  Filled 2020-01-22: qty 50

## 2020-01-22 MED ORDER — HYDROMORPHONE HCL 1 MG/ML IJ SOLN
0.2500 mg | INTRAMUSCULAR | Status: DC
  Administered 2020-01-22 – 2020-01-23 (×8): 0.25 mg via INTRAVENOUS
  Filled 2020-01-22 (×8): qty 1

## 2020-01-22 NOTE — Progress Notes (Addendum)
Pt seen and family at bedside.  Events of last 24 hours noted.  Family wishes to pursue comfort care and no more dialysis.  Given poor prognosis and unlikely chance of meaningful recovery I agree with their decision.  Will sign off.  Please call with any questions or concerns.

## 2020-01-22 NOTE — Progress Notes (Signed)
The chaplain visited with the patient and offered prayer for the family. The chaplain is available if needed.  Brion Aliment Chaplain Resident For questions concerning this note please contact me by pager [phone]

## 2020-01-22 NOTE — Consult Note (Addendum)
WOC Nurse Consult Note: Patient receiving care in [facility] 2W26.  Assisted with turning for sacral wound eval by primary RN, Tammi Klippel. Reason for Consult: lip PI, sacral wound Wound type: right half of upper lip remains a full thickness injury present for weeks.  Resulting from ETT biting per record.  Twice daily application of petroleum jelly remains on MAR. Sacral/coccyx area with an unstageable PI that measures 10 cm x 13 cm.  This is the measurement for the entire area, including the pink periphery. Pressure Injury POA: Yes--lip/No--sacral/coccyx Measurement: as above Wound bed: Periphery of sacral coccyx wound is pink, central area is black/yellow Drainage (amount, consistency, odor) none Periwound: as described Dressing procedure/placement/frequency: Twice daily petroleum jelly to lip, which is already on the [facility]. Place saline moistened gauze over the sacral/coccyx wound, cover with foam dressing, change twice daily and prn soilage. Monitor the wound area(s) for worsening of condition such as: Signs/symptoms of infection,  Increase in size,  Development of or worsening of odor, Development of pain, or increased pain at the affected locations.  Notify the medical team if any of these develop.  Val Riles, RN, MSN, CWOCN, CNS-BC, pager [phone]

## 2020-01-22 NOTE — Progress Notes (Signed)
PROGRESS NOTE    Patricia [name]  GYB:[id number] DOB: 1956/09/06 DOA: 12/24/2019 PCP: Ladell Pier, MD    Brief Narrative:  [phone] with hx ESRD on HD, chronic systolic CHF, HTN, DM2 who was admitted for cardiac arrest. Pt was intubated in the ED and admitted to [facility] service. Pt underwent hypothermic protocol and later developed hypotension with PEA arrest. After family meeting, decision was made for one-way extubation on 4/5 with transfer to [place]:   Active Problems:   ESRD (end stage renal disease) on dialysis [facility])   Cardiac arrest [facility])   Palliative care by specialist   DNR (do not resuscitate)   Endotracheally intubated   Anoxic brain damage ([place])   Pressure injury of skin   Dysphagia   Altered mental status   Acute respiratory distress  Cardiac arrest: Initial out to facility on 3/20-followed by repeat cardiac arrest on 3/28.  Concerns of anoxic brain injury resulting from cardiac arrest as well as metabolic encephalopathy.  Echo with decreased EF-given poor prognosis-doubt further work-up will change management or outcome. Palliative care following. Continuing to follow for any signs of recovery. See below, prognosis remains poor  Anoxic brain injury: Had been seen by neurology-who do not see a meaningful chance for recovery. Since transfer from ICU, there has been no significant improvement in mentation. This AM, was not able to follow simple commands. Later in the day, family at bedside noticed pt moving arm, seemingly via verbal command. Discussed with family over phone, who wishes to continue to "follow day by day"  Toxic metabolic encephalopathy: EEG/MRI as above-supportive care continues.  ?CVA: Echo/carotid ultrasound performed. Per Neurology, more concerns of hypoxic/anoxic brain injury with stroke likely resultant of either small vessel disease or embolic infarct in the setting of cardiac arrest. Neurology recommends discussing with family regarding  the possibility of an irreversible brain injury and grim chances for neurologically meaningful recovery  Dysphagia: Secondary to anoxic brain injury-Dr. Sloan Leiter discussed with palliative care team-who spoke with family --[name] was placed initially for nutrition. See below. Given concerns of aspiration, have since held further tube feeding. Family aware that PEG will not eliminate risk of aspiration. Per SLP patient has "Severe aspiration risk" -continue to hold tube feeding, family is aware and understands  Acute hypoxic respiratory failure in the setting of cardiac arrest: Extubated on 4/5 -Recently noted to have markedly increased resp effort and worsened hypoxemic failure -Serial CXR reviewed. Concern for rapidly worsening L opacity -Rapid response called overnight and patient was bipap dependent, now weaned to 10LNC -Given concerns of possible re-aspiration,pt had been continued on zosyn  Chronic systolic heart failure:  - Deferred to Nephrology for diuresis with HD.  Echo as above.  COVID-19 infection: Completed steroids/remdesivir.  Per ID note (on 4/4)-patient is no longer thought to be infectious-airborne/isolation discontinued. -Recent inflammatory markers, as well as procal markedly elevated, likely a reflection of above aspiration PNA  ESRD: Renal had been following. Pt had been receiving HD. Earlier this AM, decision was to hold further HD with focus on comfort.   HTN: Uncontrolled-not getting Coreg as patient does not have the NG tube. Pt is continued on scheduled IV Lopressor as tolerated  Anemia: Multifactorial-acute illness-superimposed on ESRD related anemia.  Acute blood loss anemia:Holding ASA and heparin subq.   Thrombocytopenia: On most recent check, appears improved from prior  Aspiration pneumonia: Had previously completed a course of antibiotics. See above. Worsening hypoxemic failure with now white-out of L lung and  bipap dependent -Currently on zosyn  and tube feeding held -Per SLP, pt has "Severe aspiration risk" -Family  at bedside aware that feeding tubes do not eliminate risk for aspiration  Palliative care/goals of care: DNR in place -Palliative Care following -Discussed with Palliative Care. Current plan to continue BiPAP with no escalation of care -Focus on pt's comfort. See previous note, pt did appear more comfortable with low dose morphine yesterday x 1 dose  -Low dose dilaudid PRN pain or sob per Palliative Care.  Nutrition Problem: Nutrition Problem: Inadequate oral intake Etiology: acute illness Had been continued on tube feeding via [name], now held given concerns of aspiration  Obesity: Estimated body mass index is 27.18kg/m as calculated from the following:   Height as of this encounter: 5\' 4"  (1.626 m).   Weight as of this encounter: 73.5 kg.   Tachycardia -IV lopressor dose recently increased to 10mg  q6hrs -HR presently stable  Pressure sores -WOC had seen and evaluated patient while in ICU regarding full thickness lip pressure sore from ETT tube  -Seen by WOC, recs noted and relayed to family  -Have also discussed with ENT who will evaluate at bedside. Earlier in the day, family had stated being against any procedures which would be considered major or invasive  DVT prophylaxis: SCD's, hold heparin Code Status: DNR Family Communication: Pt in room, family not at bedside  Status is: Inpatient  Remains inpatient appropriate because:Hemodynamically unstable   Dispo: The patient is from: Home              Anticipated d/c is to: Uncertain at this time given severity of illness, pending f/u by Palliative Care              Anticipated d/c date is: > 3 days              Patient currently is not medically stable to d/c.         Consultants:   PCCM   Neurology  Palliative Care  Nephrology  WOC  Procedures:  3/20>>4/5 ET tube 3/20 CVC > 3/24 11/23/19: R IJ tunneled perm cath:    Antimicrobials: Anti-infectives (From admission, onward)   Start     Dose/Rate Route Frequency Ordered Stop   01/20/20 1800  piperacillin-tazobactam (ZOSYN) IVPB 2.25 g     2.25 g 100 mL/hr over 30 Minutes Intravenous Every 8 hours 01/20/20 1647     01/11/20 1430  meropenem (MERREM) 500 mg in sodium chloride 0.9 % 100 mL IVPB     500 mg 200 mL/hr over 30 Minutes Intravenous Every 24 hours 01/11/20 0803 01/13/20 1509   01/10/20 2040  vancomycin variable dose per unstable renal function (pharmacist dosing)  Status:  Discontinued      Does not apply See admin instructions 01/10/20 2040 01/11/20 1748   01/10/20 1200  vancomycin (VANCOCIN) IVPB 750 mg/150 ml premix  Status:  Discontinued     750 mg 150 mL/hr over 60 Minutes Intravenous Every 24 hours 01/09/20 1218 01/10/20 2040   01/09/20 1400  meropenem (MERREM) 1 g in sodium chloride 0.9 % 100 mL IVPB  Status:  Discontinued     1 g 200 mL/hr over 30 Minutes Intravenous Every 8 hours 01/09/20 1218 01/10/20 2040   01/09/20 1200  vancomycin (VANCOCIN) IVPB 750 mg/150 ml premix  Status:  Discontinued     750 mg 150 mL/hr over 60 Minutes Intravenous Every T-Th-Sa (Hemodialysis) 01/07/20 1536 01/09/20 1218   01/07/20 1545  vancomycin (VANCOREADY) IVPB  1500 mg/300 mL     1,500 mg 150 mL/hr over 120 Minutes Intravenous  Once 01/07/20 1536 01/07/20 1754   01/07/20 1545  meropenem (MERREM) 500 mg in sodium chloride 0.9 % 100 mL IVPB  Status:  Discontinued     500 mg 200 mL/hr over 30 Minutes Intravenous Daily at bedtime 01/07/20 1536 01/09/20 1218   01/03/20 1000  piperacillin-tazobactam (ZOSYN) IVPB 3.375 g  Status:  Discontinued     3.375 g 12.5 mL/hr over 240 Minutes Intravenous Every 12 hours 01/03/20 0822 01/07/20 1539   01/02/20 1800  ceFEPIme (MAXIPIME) 2 g in sodium chloride 0.9 % 100 mL IVPB  Status:  Discontinued     2 g 200 mL/hr over 30 Minutes Intravenous Every T-Th-Sa (1800) 01/07/2020 2129 01/01/20 0844   01/02/20 1200  vancomycin  (VANCOCIN) IVPB 750 mg/150 ml premix  Status:  Discontinued     750 mg 150 mL/hr over 60 Minutes Intravenous Every T-Th-Sa (Hemodialysis) 12/29/2019 2129 01/01/20 0844   12/31/19 1000  remdesivir 100 mg in sodium chloride 0.9 % 100 mL IVPB     100 mg 200 mL/hr over 30 Minutes Intravenous Daily 12/25/2019 2243 01/03/20 0933   12/12/2019 2330  remdesivir 200 mg in sodium chloride 0.9% 250 mL IVPB     200 mg 580 mL/hr over 30 Minutes Intravenous Once 12/15/2019 2243 12/31/19 0315   12/16/2019 2045  ceFEPIme (MAXIPIME) 2 g in sodium chloride 0.9 % 100 mL IVPB     2 g 200 mL/hr over 30 Minutes Intravenous  Once 12/22/2019 2039 12/20/2019 2215   01/06/2020 2045  metroNIDAZOLE (FLAGYL) IVPB 500 mg     500 mg 100 mL/hr over 60 Minutes Intravenous  Once 12/29/2019 2039 12/22/2019 2256   01/03/2020 2045  vancomycin (VANCOCIN) IVPB 1000 mg/200 mL premix     1,000 mg 200 mL/hr over 60 Minutes Intravenous  Once 12/29/2019 2039 12/20/2019 2215      Subjective: Cannot assess given current mentation  Objective: Vitals:   01/22/20 0850 01/22/20 1232 01/22/20 1234 01/22/20 1538  BP:  (!) 181/83 (!) 177/73 (!) 159/70  Pulse: 79 88 87 86  Resp: 20 19 20 19   Temp:  (!) 97.5 F (36.4 C)  97.7 F (36.5 C)  TempSrc:  Oral  Oral  SpO2: 100% 100% 100% 100%  Weight:      Height:        Intake/Output Summary (Last 24 hours) at 01/22/2020 1649 Last data filed at 01/22/2020 0858 Gross per 24 hour  Intake 203.2 ml  Output 190 ml  Net 13.2 ml   Filed Weights   01/18/20 1059 01/19/20 0652 01/22/20 0500  Weight: 71.5 kg 73.5 kg 74.3 kg    Examination: General exam: Awake, laying in bed, in nad Respiratory system: Shallow breaths, no wheezing Cardiovascular system: regular rate, s1, s2 Gastrointestinal system: Soft, nondistended, positive BS Central nervous system: CN2-12 grossly intact, strength intact Extremities: Perfused, no clubbing Skin: Normal skin turgor, full thickness pressure injury of upper lip Psychiatry:  Unable to assess given current mentation  Data Reviewed: I have personally reviewed following labs and imaging studies  CBC: Recent Labs  Lab 01/17/20 0500 01/17/20 1500 01/18/20 0812  WBC 17.8* 15.1* 16.3*  HGB 8.0* 7.5* 7.7*  HCT 25.3* 23.8* 23.9*  MCV 96.9 96.0 97.6  PLT 185 173 546   Basic Metabolic Panel: Recent Labs  Lab 01/17/20 0500 01/18/20 0812  NA 137 137  K 4.1 3.6  CL 100 102  CO2  21* 21*  GLUCOSE 174* 366*  BUN 50* 74*  CREATININE 3.46* 4.73*  CALCIUM 7.7* 8.2*  PHOS 4.4 5.7*   GFR: Estimated Creatinine Clearance: 12.3 mL/min (A) (by C-G formula based on SCr of 4.73 mg/dL (H)). Liver Function Tests: Recent Labs  Lab 01/17/20 0500 01/18/20 0812  ALBUMIN 1.4* 1.4*   No results for input(s): LIPASE, AMYLASE in the last 168 hours. No results for input(s): AMMONIA in the last 168 hours. Coagulation Profile: No results for input(s): INR, PROTIME in the last 168 hours. Cardiac Enzymes: No results for input(s): CKTOTAL, CKMB, CKMBINDEX, TROPONINI in the last 168 hours. BNP (last 3 results) No results for input(s): PROBNP in the last 8760 hours. HbA1C: No results for input(s): HGBA1C in the last 72 hours. CBG: Recent Labs  Lab 01/22/20 0432 01/22/20 0816 01/22/20 1231 01/22/20 1529 01/22/20 1618  GLUCAP 151* 141* 106* 64* 103*   Lipid Profile: No results for input(s): CHOL, HDL, LDLCALC, TRIG, CHOLHDL, LDLDIRECT in the last 72 hours. Thyroid Function Tests: No results for input(s): TSH, T4TOTAL, FREET4, T3FREE, THYROIDAB in the last 72 hours. Anemia Panel: Recent Labs    01/20/20 1732  FERRITIN 6,975*   Sepsis Labs: Recent Labs  Lab 01/20/20 1732 01/21/20 0500 01/22/20 0312  PROCALCITON 5.63 6.12 4.83    Recent Results (from the past 240 hour(s))  Respiratory Panel by RT PCR (Flu A&B, Covid) - Nasopharyngeal Swab     Status: None   Collection Time: 01/14/20  9:38 AM   Specimen: Nasopharyngeal Swab  Result Value Ref Range Status     SARS Coronavirus 2 by RT PCR NEGATIVE NEGATIVE Final    Comment: (NOTE) SARS-CoV-2 target nucleic acids are NOT DETECTED. The SARS-CoV-2 RNA is generally detectable in upper respiratoy specimens during the acute phase of infection. The lowest concentration of SARS-CoV-2 viral copies this assay can detect is 131 copies/mL. A negative result does not preclude SARS-Cov-2 infection and should not be used as the sole basis for treatment or other patient management decisions. A negative result may occur with  improper specimen collection/handling, submission of specimen other than nasopharyngeal swab, presence of viral mutation(s) within the areas targeted by this assay, and inadequate number of viral copies (<131 copies/mL). A negative result must be combined with clinical observations, patient history, and epidemiological information. The expected result is Negative. Fact Sheet for Patients:  PinkCheek.be Fact Sheet for Healthcare Providers:  GravelBags.it This test is not yet ap proved or cleared by the [place] FDA and  has been authorized for detection and/or diagnosis of SARS-CoV-2 by FDA under an Emergency Use Authorization (EUA). This EUA will remain  in effect (meaning this test can be used) for the duration of the COVID-19 declaration under Section 564(b)(1) of the Act, 21 U.S.C. section 360bbb-3(b)(1), unless the authorization is terminated or revoked sooner.    Influenza A by PCR NEGATIVE NEGATIVE Final   Influenza B by PCR NEGATIVE NEGATIVE Final    Comment: (NOTE) The Xpert Xpress SARS-CoV-2/FLU/RSV assay is intended as an aid in  the diagnosis of influenza from Nasopharyngeal swab specimens and  should not be used as a sole basis for treatment. Nasal washings and  aspirates are unacceptable for Xpert Xpress SARS-CoV-2/FLU/RSV  testing. Fact Sheet for Patients: PinkCheek.be Fact  Sheet for Healthcare Providers: GravelBags.it This test is not yet approved or cleared by the [place] FDA and  has been authorized for detection and/or diagnosis of SARS-CoV-2 by  FDA under an Emergency Use Authorization (EUA).  This EUA will remain  in effect (meaning this test can be used) for the duration of the  Covid-19 declaration under Section 564(b)(1) of the Act, 21  U.S.C. section 360bbb-3(b)(1), unless the authorization is  terminated or revoked. Performed at [facility], [place] [address], [place], [place] [address]      Radiology Studies: DG Chest [place] 1 View  Result Date: 01/20/2020 CLINICAL DATA:  64 year old female with rapid response and shortness of breath. EXAM: PORTABLE CHEST 1 VIEW COMPARISON:  Chest radiograph dated 1421. FINDINGS: Dialysis catheter, right IJ central venous line, and partially visualized feeding tube extending below the diaphragm as seen previously. There is opacification of the majority of the left lung significantly progressed since the prior radiograph. A small left pleural effusion noted. There is overall loss of left lung volume with shift of the mediastinum into the left hemithorax indicative of atelectasis or collapse. The right lung is clear. There is no pneumothorax. Atherosclerotic calcification of the aorta. No acute osseous pathology. IMPRESSION: 1. Opacification of the majority of the left lung most consistent with atelectasis or collapse. There is associated decreased left lung volume and shift of the mediastinum into the left hemithorax. 2. Small left pleural effusion. Electronically Signed   By: Anner Crete M.D.   On: 01/20/2020 21:42    Scheduled Meds: . Chlorhexidine Gluconate Cloth  6 each Topical Q0600  . darbepoetin (ARANESP) injection - DIALYSIS  200 mcg Intravenous Q Thu-HD  .  HYDROmorphone (DILAUDID) injection  0.25 mg Intravenous Q4H  . insulin aspart  0-6 Units Subcutaneous Q4H  .  insulin aspart  4 Units Subcutaneous Q4H  . mouth rinse  15 mL Mouth Rinse q12n4p  . metoprolol tartrate  10 mg Intravenous Q6H  . pantoprazole (PROTONIX) IV  40 mg Intravenous Q0600  . white petrolatum   Topical BID   Continuous Infusions: . feeding supplement (NEPRO CARB STEADY) 1,000 mL (01/20/20 1535)  . piperacillin-tazobactam (ZOSYN)  IV 2.25 g (01/22/20 0858)     LOS: 23 days   Marylu Lund, MD [facility] Pager On Amion  If 7PM-7AM, please contact night-coverage 01/22/2020, 4:49 PM

## 2020-01-22 NOTE — Consult Note (Signed)
OTOLARYNGOLOGY CONSULTATION  Primary Care Physician: Ladell Pier, MD Patient Location at Initial Consult: Emergency Department Chief Complaint/Reason for Consult: lip injury  History of Presenting Illness:    Patricia [name] is a  65 y.o. female presenting with right upper lip injury. The patient presented with cardiac arrest on 3/20.  Reportedly she was in dialysis and then went home became nauseated and vomited and had cardiac arrest.  CPR initiated and had ROSC after 15 minutes of downtime.  Patient was intubated in the emergency department due to being nonresponsive.  COVID-19 resulted positive, patient was otherwise asymptomatic.  Underwent one-way extubation on 4/5 with plan transfer to [facility].  She suffers from end-stage renal disease on hemodialysis, chronic systolic congestive heart failure.  Suspect significant neurological compromise due to anoxic brain injury.  Right upper lip injury was reportedly noticed a couple of weeks ago.  Staff has been placing Vaseline on the right upper lip.  Family expressed concerns to primary team regarding the status of this wound.  Past Medical History:  Diagnosis Date  . Allergy   . Anemia   . Anxiety   . Blood transfusion without reported diagnosis   . Cardiomyopathy ([place]) 11/2016   no ischemic eval due to CKD  . CHF (congestive heart failure) ([place])   . CKD (chronic kidney disease), stage IV ([place])   . Depression   . Diabetes mellitus without complication ([place])    type 2  . Diverticulitis   . Dyspnea   . Hypertension   . Obesity     Past Surgical History:  Procedure Laterality Date  . ABDOMINAL HYSTERECTOMY    . BREAST SURGERY     reduction  . [place]  . IR FLUORO GUIDE CV LINE RIGHT  11/20/2019  . IR FLUORO GUIDE CV LINE RIGHT  11/23/2019  . IR US GUIDE VASC ACCESS RIGHT  11/20/2019  . LAPAROSCOPY N/A 06/16/2019   Procedure: primary closure of umbilical hernia;  Surgeon: Ralene Ok, MD;  Location: [place];   Service: General;  Laterality: N/A;  . LIPOMA EXCISION     back  Dr. Excell Seltzer 03-22-18  . LIPOMA EXCISION N/A 03/22/2018   Procedure: EXCISION OF BACK LIPOMA;  Surgeon: Excell Seltzer, MD;  Location: [facility];  Service: General;  Laterality: N/A;  . REDUCTION MAMMAPLASTY Bilateral     Family History  Problem Relation Age of Onset  . Diabetes Brother   . Stomach cancer Brother   . Kidney disease Brother   . Heart Problems Maternal Grandmother        pacemaker  . Heart disease Maternal Grandfather   . Rectal cancer Neg Hx   . Esophageal cancer Neg Hx   . Liver cancer Neg Hx   . Colon cancer Neg Hx     Social History   Socioeconomic History  . Marital status: Divorced    Spouse name: Not on file  . Number of children: 2  . Years of education: Not on file  . Highest education level: Not on file  Occupational History  . Occupation: disabled  Tobacco Use  . Smoking status: Never Smoker  . Smokeless tobacco: Never Used  Substance and Sexual Activity  . Alcohol use: No  . Drug use: No  . Sexual activity: Not Currently  Other Topics Concern  . Not on file  Social History Narrative  . Not on file   Social Determinants of Health   Financial Resource Strain:   . Difficulty  of Paying Living Expenses:   Food Insecurity:   . Worried About Charity fundraiser in the Last Year:   . Arboriculturist in the Last Year:   Transportation Needs:   . Film/video editor (Medical):   Marland Kitchen Lack of Transportation (Non-Medical):   Physical Activity:   . Days of Exercise per Week:   . Minutes of Exercise per Session:   Stress:   . Feeling of Stress :   Social Connections:   . Frequency of Communication with Friends and Family:   . Frequency of Social Gatherings with Friends and Family:   . Attends Religious Services:   . Active Member of Clubs or Organizations:   . Attends Archivist Meetings:   Marland Kitchen Marital Status:     No current facility-administered medications on file  prior to encounter.   Current Outpatient Medications on File Prior to Encounter  Medication Sig Dispense Refill  . acetaminophen (TYLENOL) 500 MG tablet Take 1,000 mg by mouth in the morning and at bedtime.    Marland Kitchen albuterol (PROVENTIL HFA;VENTOLIN HFA) 108 (90 Base) MCG/ACT inhaler Inhale 1-2 puffs into the lungs every 6 (six) hours as needed for wheezing or shortness of breath. (Patient taking differently: Inhale 1-2 puffs into the lungs every 4 (four) hours as needed for wheezing or shortness of breath. ) 1 Inhaler 0  . Amino Acids-Protein Hydrolys (FEEDING SUPPLEMENT, PRO-STAT SUGAR FREE 64,) LIQD Take 30 mLs by mouth 2 (two) times daily. 887 mL 0  . aspirin EC 81 MG tablet Take 1 tablet (81 mg total) by mouth daily. 90 tablet 3  . atorvastatin (LIPITOR) 20 MG tablet Take 1/2 (one-half) tablet by mouth once daily (Patient taking differently: Take 10 mg by mouth daily. ) 45 tablet 0  . calcitRIOL (ROCALTROL) 0.25 MCG capsule Take 0.25 mcg by mouth daily.    . carvedilol (COREG) 25 MG tablet TAKE 1 TABLET BY MOUTH TWICE DAILY WITH A MEAL (Patient taking differently: Take 12.5 mg by mouth 2 (two) times daily with a meal. ) 60 tablet 10  . citalopram (CELEXA) 20 MG tablet Take 1 tablet (20 mg total) by mouth daily. 90 tablet 1  . fluconazole (DIFLUCAN) 200 MG tablet Take 200 mg by mouth daily.    Marland Kitchen gabapentin (NEURONTIN) 100 MG capsule TAKE 1 CAPSULE (100 MG TOTAL) BY MOUTH AT BEDTIME. 90 capsule 3  . hydrALAZINE (APRESOLINE) 25 MG tablet Take 1 tablet (25 mg total) by mouth 2 (two) times daily. 60 tablet 5  . hydrOXYzine (ATARAX/VISTARIL) 25 MG tablet Take 1 tablet (25 mg total) by mouth daily as needed for anxiety. 30 tablet 3  . insulin aspart (NOVOLOG FLEXPEN) 100 UNIT/ML FlexPen Give 2 units with meals if blood sugar > or =300 (Patient taking differently: Inject 2 Units into the skin 3 (three) times daily with meals. Give 2 units with meals if blood sugar > or =300) 15 mL 11  . Insulin Glargine  (LANTUS SOLOSTAR) 100 UNIT/ML Solostar Pen Inject 6 Units into the skin at bedtime. 15 mL 2  . multivitamin (RENA-VIT) TABS tablet Take 1 tablet by mouth at bedtime. 30 tablet 0  . promethazine (PHENERGAN) 25 MG tablet Take 25 mg by mouth daily as needed for nausea.     . sevelamer (RENAGEL) 800 MG tablet Take 800 mg by mouth 3 (three) times daily with meals.     Marland Kitchen ACCU-CHEK FASTCLIX LANCETS MISC Uad tid (Patient taking differently: 1 each by Other  route 3 (three) times daily. ) 100 each 12  . acetaminophen (TYLENOL) 325 MG tablet Take 2 tablets (650 mg total) by mouth every 6 (six) hours as needed. (Patient not taking: Reported on 12/31/2019) 30 tablet 0  . Blood Glucose Monitoring Suppl (ACCU-CHEK GUIDE) w/Device KIT 1 each by Does not apply route 3 (three) times daily. Use tid as directed 1 kit 0  . glucose blood (ACCU-CHEK GUIDE) test strip Use as instructed tid (Patient taking differently: 1 each by Other route 3 (three) times daily. ) 100 each 12  . Insulin Pen Needle (TRUEPLUS PEN NEEDLES) 32G X 4 MM MISC Use as directed to inject insulin. (Patient taking differently: 1 each by Other route daily. ) 100 each 3  . Insulin Pen Needle 31G X 5 MM MISC Use as directed (Patient taking differently: 1 each by Other route daily. ) 100 each 12  . oxyCODONE (OXY IR/ROXICODONE) 5 MG immediate release tablet Take 1 tablet (5 mg total) by mouth every 4 (four) hours as needed for moderate pain or breakthrough pain (Hold & Call MD if SBP<90, HR<65, RR<10, O2<90, or altered mental status.). (Patient not taking: Reported on 12/31/2019) 20 tablet 0    Allergies  Allergen Reactions  . Codone [Hydrocodone] Itching  . Norvasc [Amlodipine Besylate] Swelling    Lower extremity  . Dextromethorphan-Guaifenesin Nausea And Vomiting  . Hydralazine Swelling and Other (See Comments)    Leg swelling. Pt takes this as o/p. Patient can take PO now  . Sulfa Antibiotics Itching and Rash     Review of Systems: ROS unable to  be complete secondary to patient mental status   OBJECTIVE: Vital Signs: Vitals:   01/22/20 1538 01/22/20 1805  BP: (!) 159/70 (!) 130/57  Pulse: 86 96  Resp: 19 19  Temp: 97.7 F (36.5 C)   SpO2: 100% 97%    I&O  Intake/Output Summary (Last 24 hours) at 01/22/2020 1837 Last data filed at 01/22/2020 0858 Gross per 24 hour  Intake 203.2 ml  Output 190 ml  Net 13.2 ml    Physical Exam General:  Patient somewhat obtunded appearing, intermittently following commands to squeeze hands.  Not responsive nonverbal.  No acute distress.    Head/Face: Normocephalic, atraumatic.  No sinus tenderness. Facial nerve intact and equal bilaterally.  No facial lacerations. Salivary glands non tender and without palpable masses  Eyes: Globes well positioned, no proptosis Lids: No periorbital edema/ecchymosis. No lid laceration Conjunctiva: No chemosis, hemorrhage PERRL Extra occular movement: Full ROM bilaterally. No gaze restriction    Ears: No gross deformity. Normal external canal. Tympanic membrane intact bilaterally and without effusion  Hearing:  Normal speech reception.  Nose: No gross deformity or lesions. No purulent discharge. Septum midline. No turbinate hypertrophy.  Mouth/Oropharynx: Lips without any lesions. Dentition normal. No mucosal lesions within the oropharynx. No tonsillar enlargement, exudate, or lesions. Pharyngeal walls symmetrical. Uvula midline. Tongue midline without lesions.  Neck: Trachea midline. No masses. No thyromegaly or nodules palpated. No crepitus.  Lymphatic: No lymphadenopathy in the neck.  Respiratory: No stridor or distress.  Cardiovascular: Regular rate and rhythm.  Extremities: No edema or cyanosis. Warm and well-perfused.  Skin:  Right upper lip with through and through defect laterally to Cupids bow involves through and through defect of all wet lip, crossing right [place] border,  extending onto dry lip. There is still some desquamating tissue along  the medial border.    Neurologic: CN II-XII intact. Moving all extremities without gross abnormality.  Other:      Labs: Lab Results  Component Value Date   WBC 16.3 (H) 01/18/2020   HGB 7.7 (L) 01/18/2020   HCT 23.9 (L) 01/18/2020   PLT 188 01/18/2020   CHOL 48 01/08/2020   TRIG 41 01/08/2020   HDL 15 (L) 01/08/2020   ALT 820 (H) 01/09/2020   AST 537 (H) 01/09/2020   NA 137 01/18/2020   K 3.6 01/18/2020   CL 102 01/18/2020   CREATININE 4.73 (H) 01/18/2020   BUN 74 (H) 01/18/2020   CO2 21 (L) 01/18/2020   TSH 0.402 11/20/2019   INR 1.5 (H) 01/09/2020   HGBA1C 7.4 (H) 11/20/2019   MICROALBUR 99.7 08/10/2016       ASSESSMENT:  64 y.o. female with right upper lip defect s/p cardiac arrest and prolonged intubation; patient now moving towards palliative care due to multiple significant medical comorbidities.   RECOMMENDATIONS: -I called and discussed repair with the patient's daughter. Repair would involve wedge resection of lip and skin and complex laceration closure. This would likely be painful and I am not sure I could complete this at the bedside with patient movement, etc. In addition, some tissue is still declaring itself, so closure may need to be delayed for the time being for best wound healing. Family is therefore not interested in pursuing this at this time.  -TID cleaning with H2O2 on Q tips to lip, then pat dry and apply bacitracin ointment.  -Please contact me if I can be of further assistance.    Gavin Pound, MD  [facility] [place] Office phone [phone]

## 2020-01-22 NOTE — Plan of Care (Signed)
  Problem: Cardiac: Goal: Ability to achieve and maintain adequate cardiopulmonary perfusion will improve Outcome: Not Progressing   Problem: Neurologic: Goal: Promote progressive neurologic recovery Outcome: Not Progressing   Problem: Skin Integrity: Goal: Risk for impaired skin integrity will be minimized. Outcome: Not Progressing   Problem: Education: Goal: Knowledge of General Education information will improve Description: Including pain rating scale, medication(s)/side effects and non-pharmacologic comfort measures Outcome: Not Progressing   Problem: Health Behavior/Discharge Planning: Goal: Ability to manage health-related needs will improve Outcome: Not Progressing   Problem: Clinical Measurements: Goal: Ability to maintain clinical measurements within normal limits will improve Outcome: Not Progressing Goal: Will remain free from infection Outcome: Not Progressing Goal: Diagnostic test results will improve Outcome: Not Progressing   Problem: Activity: Goal: Risk for activity intolerance will decrease Outcome: Not Progressing   Problem: Nutrition: Goal: Adequate nutrition will be maintained Outcome: Not Progressing   Problem: Safety: Goal: Ability to remain free from injury will improve Outcome: Not Progressing   Problem: Skin Integrity: Goal: Risk for impaired skin integrity will decrease Outcome: Not Progressing

## 2020-01-22 NOTE — TOC Progression Note (Signed)
Transition of Care [facility]) - Progression Note    Patient Details  Name: Patricia [name] MRN: [id number] Date of Birth: 05-02-1956  Transition of [facility]) CM/SW [place], RN Phone Number: 01/22/2020, 3:36 PM  Clinical Narrative: MD reports that patients care will transition to comfort. Will follow up with MD for possible residential hospice vs expected hospital death.             Expected Discharge Plan and Services                                                 Social Determinants of Health (SDOH) Interventions    Readmission Risk Interventions No flowsheet data found.

## 2020-01-22 NOTE — Progress Notes (Signed)
Patient had a red loose stool in her flexi-seal. Dr. Wyline Copas was around and witnessed the color and amount this morning at about 0900.

## 2020-01-23 DIAGNOSIS — Z789 Other specified health status: Secondary | ICD-10-CM | POA: Diagnosis not present

## 2020-01-23 DIAGNOSIS — R0603 Acute respiratory distress: Secondary | ICD-10-CM | POA: Diagnosis not present

## 2020-01-23 LAB — CBC WITH DIFFERENTIAL/PLATELET
Abs Immature Granulocytes: 0.1 10*3/uL — ABNORMAL HIGH (ref 0.00–0.07)
Basophils Absolute: 0 10*3/uL (ref 0.0–0.1)
Basophils Relative: 0 %
Eosinophils Absolute: 0 10*3/uL (ref 0.0–0.5)
Eosinophils Relative: 0 %
HCT: 23.5 % — ABNORMAL LOW (ref 36.0–46.0)
Hemoglobin: 7.4 g/dL — ABNORMAL LOW (ref 12.0–15.0)
Immature Granulocytes: 2 %
Lymphocytes Relative: 12 %
Lymphs Abs: 0.7 10*3/uL (ref 0.7–4.0)
MCH: 30 pg (ref 26.0–34.0)
MCHC: 31.5 g/dL (ref 30.0–36.0)
MCV: 95.1 fL (ref 80.0–100.0)
Monocytes Absolute: 0.3 10*3/uL (ref 0.1–1.0)
Monocytes Relative: 6 %
Neutro Abs: 4.6 10*3/uL (ref 1.7–7.7)
Neutrophils Relative %: 80 %
Platelets: 207 10*3/uL (ref 150–400)
RBC: 2.47 MIL/uL — ABNORMAL LOW (ref 3.87–5.11)
RDW: 17 % — ABNORMAL HIGH (ref 11.5–15.5)
WBC: 5.8 10*3/uL (ref 4.0–10.5)
nRBC: 2.1 % — ABNORMAL HIGH (ref 0.0–0.2)

## 2020-01-23 LAB — GLUCOSE, CAPILLARY
Glucose-Capillary: 141 mg/dL — ABNORMAL HIGH (ref 70–99)
Glucose-Capillary: 186 mg/dL — ABNORMAL HIGH (ref 70–99)

## 2020-01-23 MED ORDER — HALOPERIDOL 0.5 MG PO TABS: 0.5000 mg | ORAL_TABLET | ORAL | Status: DC | PRN

## 2020-01-23 MED ORDER — HALOPERIDOL LACTATE 2 MG/ML PO CONC
0.5000 mg | ORAL | Status: DC | PRN
  Filled 2020-01-23: qty 0.3

## 2020-01-23 MED ORDER — HYDROMORPHONE HCL 1 MG/ML IJ SOLN
0.5000 mg | INTRAMUSCULAR | Status: DC
  Administered 2020-01-23 – 2020-01-26 (×17): 0.5 mg via INTRAVENOUS
  Filled 2020-01-23 (×16): qty 1

## 2020-01-23 MED ORDER — BIOTENE DRY MOUTH MT LIQD: 15.0000 mL | OROMUCOSAL | Status: DC | PRN

## 2020-01-23 MED ORDER — GLYCOPYRROLATE 0.2 MG/ML IJ SOLN: 0.2000 mg | INTRAMUSCULAR | Status: DC | PRN

## 2020-01-23 MED ORDER — ONDANSETRON HCL 4 MG/2ML IJ SOLN: 4.0000 mg | Freq: Four times a day (QID) | INTRAMUSCULAR | Status: DC | PRN

## 2020-01-23 MED ORDER — HYDROMORPHONE HCL 1 MG/ML IJ SOLN
0.5000 mg | INTRAMUSCULAR | Status: DC | PRN
  Administered 2020-01-26: 0.5 mg via INTRAVENOUS
  Filled 2020-01-23 (×2): qty 1

## 2020-01-23 MED ORDER — GLYCOPYRROLATE 0.2 MG/ML IJ SOLN
0.3000 mg | INTRAMUSCULAR | Status: DC
  Administered 2020-01-23 – 2020-01-26 (×16): 0.3 mg via INTRAVENOUS
  Filled 2020-01-23 (×17): qty 2

## 2020-01-23 MED ORDER — GLYCOPYRROLATE 1 MG PO TABS: 1.0000 mg | ORAL_TABLET | ORAL | Status: DC | PRN

## 2020-01-23 MED ORDER — ONDANSETRON 4 MG PO TBDP: 4.0000 mg | ORAL_TABLET | Freq: Four times a day (QID) | ORAL | Status: DC | PRN

## 2020-01-23 MED ORDER — HALOPERIDOL LACTATE 5 MG/ML IJ SOLN: 0.5000 mg | INTRAMUSCULAR | Status: DC | PRN

## 2020-01-23 NOTE — Progress Notes (Signed)
Palliative Care Progress Note  Follow-up visit today with family at bedside. Patricia [name] was remarkably lucid and clearly was recognizing her children in the room. She turned her head towards me when I called her name and was able to squeeze my hand. She was tracking in the room.  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 HD stopped this AM and she was transitioned to comfort care and extubated on 4/5. PC following for symptom management and support.  Family discussed with me their disbelief and encouragement about her mental status and purposeful actions. They consider this to be a miracle and I cannot explain it medically. I supported them by encouraging them to be in this moment her and we should focus on presence and love and maintain our current level of medical interventions.-I also encouraged them to take  take things one day at a time and this may be a surge of energy or a rally before EOL.  I agree to meet them tomorrow to reassess her status and make needed decisions moving forward.  Discussed case with hospitalist Dr. Wyline Copas.  Patricia Hacker, DO Palliative Medicine  Time: 35 min Greater than 50%  of this time was spent counseling and coordinating care related to the above assessment and plan.

## 2020-01-23 NOTE — Progress Notes (Signed)
PROGRESS NOTE    Patricia [name]  SWN:[id number] DOB: [birth year] DOA: 12/12/2019 PCP: Ladell Pier, MD    Brief Narrative:  [phone] with hx ESRD on HD, chronic systolic CHF, HTN, DM2 who was admitted for cardiac arrest. Pt was intubated in the ED and admitted to [facility] service. Pt underwent hypothermic protocol and later developed hypotension with PEA arrest. After family meeting, decision was made for one-way extubation on 4/5 with transfer to [place]:   Active Problems:   ESRD (end stage renal disease) on dialysis [facility])   Cardiac arrest [facility])   Palliative care by specialist   DNR (do not resuscitate)   Endotracheally intubated   Anoxic brain damage ([place])   Pressure injury of skin   Dysphagia   Altered mental status   Acute respiratory distress  Cardiac arrest: Initial out to facility on 3/20-followed by repeat cardiac arrest on 3/28.  Concerns of anoxic brain injury resulting from cardiac arrest as well as metabolic encephalopathy. Prognosis very poor. See below, now full comfort measures.  Anoxic brain injury: Had been seen by neurology-who do not see a meaningful chance for recovery. Since transfer from ICU, there has been no significant improvement in mentation. Patient now actively dying, transitioned to comfort care.  Toxic metabolic encephalopathy: EEG/MRI as above-supportive care continues.  ?CVA: Echo/carotid ultrasound performed. Per Neurology, more concerns of hypoxic/anoxic brain injury with stroke likely resultant of either small vessel disease or embolic infarct in the setting of cardiac arrest. Neurology has mentioned concerns of irreversible brain injury and grim chances for neurologically meaningful recovery  Dysphagia: Secondary to anoxic brain injury-Dr. Sloan Leiter discussed with palliative care team-who spoke with family --[name] was placed initially for nutrition. See below. Given concerns of aspiration, have since held further tube feeding.  Family aware that PEG will not eliminate risk of aspiration. Per SLP patient has "Severe aspiration risk" -Now comfort care  Acute hypoxic respiratory failure in the setting of cardiac arrest: Extubated on 4/5 -Recently noted to have markedly increased resp effort and worsened hypoxemic failure -Serial CXR reviewed. Concern for rapidly worsening L opacity -Rapid response called overnight and patient was bipap dependent, now weaned to 10LNC -Given concerns of possible re-aspiration,pt had been continued on zosyn  Chronic systolic heart failure:  - Deferred to Nephrology for diuresis with HD.  Echo as above.  COVID-19 infection: Completed steroids/remdesivir.  Per ID note (on 4/4)-patient is no longer thought to be infectious-airborne/isolation discontinued.  ESRD: Renal had been following. Pt had been receiving HD. Decision was to hold further HD with focus on comfort.   HTN:  Now full comfort care  Anemia: Multifactorial-acute illness-superimposed on ESRD related anemia. No further blood draws. Comfort care only  Acute blood loss anemia:Stopped asa and anticoagulation. Now full comfort care  Thrombocytopenia: On most recent check, appears improved from prior  Aspiration pneumonia: Had previously completed a course of antibiotics. See above. Worsening hypoxemic failure with now white-out of L lung and bipap dependent Respiratory status worsened and patient now floridly septic, actively dying D/c abx and transitioned to full comfort per below  Palliative care/goals of care: DNR in place -Palliative Care following -Patient now actively dying with severe sepsis despite aggressive treatment -Family has decided on transition to full comfort status  Nutrition Problem: Nutrition Problem: Inadequate oral intake Etiology: acute illness Had been continued on tube feeding via [name], now held given concerns of aspiration Now comfort care  Obesity: Estimated body mass index is  27.18kg/m  as calculated from the following:   Height as of this encounter: 5\' 4"  (1.626 m).   Weight as of this encounter: 73.5 kg.   Tachycardia -IV lopressor dose recently increased to 10mg  q6hrs -meds discontinued given comfort care  Pressure sores -WOC had seen and evaluated patient while in ICU regarding full thickness lip pressure sore from ETT tube  -Seen by WOC, recs noted and relayed to family  -Have also discussed with ENT who will evaluate at bedside. Per ENT, surgical correction of lip sore would likely involve painful surgery. Family has decided to hold off on surgical management of lip sore  DVT prophylaxis: comfort care Code Status: DNR, full comfort Family Communication: Pt in room, family remains at bedside  Status is: Inpatient  Remains inpatient appropriate because:Hemodynamically unstable   Dispo: The patient is from: Home              Anticipated d/c is to: anticipate hospital death vs residental hospice              Anticipated d/c date is: 1 day              Patient currently is not medically stable to d/c.  Consultants:   PCCM   Neurology  Palliative Care  Nephrology  [place]  ENT  Procedures:  3/20>>4/5 ET tube 3/20 CVC > 3/24 11/23/19: R IJ tunneled perm cath:   Antimicrobials: Anti-infectives (From admission, onward)   Start     Dose/Rate Route Frequency Ordered Stop   01/20/20 1800  piperacillin-tazobactam (ZOSYN) IVPB 2.25 g  Status:  Discontinued     2.25 g 100 mL/hr over 30 Minutes Intravenous Every 8 hours 01/20/20 1647 01/23/20 1130   01/11/20 1430  meropenem (MERREM) 500 mg in sodium chloride 0.9 % 100 mL IVPB     500 mg 200 mL/hr over 30 Minutes Intravenous Every 24 hours 01/11/20 0803 01/13/20 1509   01/10/20 2040  vancomycin variable dose per unstable renal function (pharmacist dosing)  Status:  Discontinued      Does not apply See admin instructions 01/10/20 2040 01/11/20 1748   01/10/20 1200  vancomycin (VANCOCIN) IVPB 750  mg/150 ml premix  Status:  Discontinued     750 mg 150 mL/hr over 60 Minutes Intravenous Every 24 hours 01/09/20 1218 01/10/20 2040   01/09/20 1400  meropenem (MERREM) 1 g in sodium chloride 0.9 % 100 mL IVPB  Status:  Discontinued     1 g 200 mL/hr over 30 Minutes Intravenous Every 8 hours 01/09/20 1218 01/10/20 2040   01/09/20 1200  vancomycin (VANCOCIN) IVPB 750 mg/150 ml premix  Status:  Discontinued     750 mg 150 mL/hr over 60 Minutes Intravenous Every T-Th-Sa (Hemodialysis) 01/07/20 1536 01/09/20 1218   01/07/20 1545  vancomycin (VANCOREADY) IVPB 1500 mg/300 mL     1,500 mg 150 mL/hr over 120 Minutes Intravenous  Once 01/07/20 1536 01/07/20 1754   01/07/20 1545  meropenem (MERREM) 500 mg in sodium chloride 0.9 % 100 mL IVPB  Status:  Discontinued     500 mg 200 mL/hr over 30 Minutes Intravenous Daily at bedtime 01/07/20 1536 01/09/20 1218   01/03/20 1000  piperacillin-tazobactam (ZOSYN) IVPB 3.375 g  Status:  Discontinued     3.375 g 12.5 mL/hr over 240 Minutes Intravenous Every 12 hours 01/03/20 0822 01/07/20 1539   01/02/20 1800  ceFEPIme (MAXIPIME) 2 g in sodium chloride 0.9 % 100 mL IVPB  Status:  Discontinued     2  g 200 mL/hr over 30 Minutes Intravenous Every T-Th-Sa (1800) 01/09/2020 2129 01/01/20 0844   01/02/20 1200  vancomycin (VANCOCIN) IVPB 750 mg/150 ml premix  Status:  Discontinued     750 mg 150 mL/hr over 60 Minutes Intravenous Every T-Th-Sa (Hemodialysis) 01/09/2020 2129 01/01/20 0844   12/31/19 1000  remdesivir 100 mg in sodium chloride 0.9 % 100 mL IVPB     100 mg 200 mL/hr over 30 Minutes Intravenous Daily 12/14/2019 2243 01/03/20 0933   12/18/2019 2330  remdesivir 200 mg in sodium chloride 0.9% 250 mL IVPB     200 mg 580 mL/hr over 30 Minutes Intravenous Once 12/17/2019 2243 12/31/19 0315   01/06/2020 2045  ceFEPIme (MAXIPIME) 2 g in sodium chloride 0.9 % 100 mL IVPB     2 g 200 mL/hr over 30 Minutes Intravenous  Once 01/03/2020 2039 12/26/2019 2215   01/02/2020 2045   metroNIDAZOLE (FLAGYL) IVPB 500 mg     500 mg 100 mL/hr over 60 Minutes Intravenous  Once 12/14/2019 2039 01/07/2020 2256   01/09/2020 2045  vancomycin (VANCOCIN) IVPB 1000 mg/200 mL premix     1,000 mg 200 mL/hr over 60 Minutes Intravenous  Once 12/13/2019 2039 01/07/2020 2215      Subjective: Unable to assess given current mentation  Objective: Vitals:   01/23/20 0329 01/23/20 0539 01/23/20 0724 01/23/20 0919  BP: (!) 135/49 (!) 109/53 (!) 102/24 (!) 139/54  Pulse: (!) 113 (!) 110 (!) 115 (!) 115  Resp:   (!) 26 (!) 28  Temp: 97.7 F (36.5 C)  98.8 F (37.1 C) (!) 100.7 F (38.2 C)  TempSrc: Axillary  Axillary Axillary  SpO2: 100%  96% 94%  Weight: 73.9 kg     Height:        Intake/Output Summary (Last 24 hours) at 01/23/2020 1904 Last data filed at 01/23/2020 0316 Gross per 24 hour  Intake 558.62 ml  Output 0 ml  Net 558.62 ml   Filed Weights   01/19/20 0652 01/22/20 0500 01/23/20 0329  Weight: 73.5 kg 74.3 kg 73.9 kg    Examination: General exam: Not conversant,appears uncomfortable Respiratory system: increased respiratory effort, clear, no audible wheezing Cardiovascular system: tachycardic, s1-s2 Gastrointestinal system: Nondistended, nontender, pos BS Central nervous system: No seizures, no tremors Extremities: No cyanosis, no joint deformities Skin: No rashes, no pallor, full thickness pressure sore of upper lip Psychiatry: unable to assess given mentation  Data Reviewed: I have personally reviewed following labs and imaging studies  CBC: Recent Labs  Lab 01/17/20 0500 01/17/20 1500 01/18/20 0812 01/23/20 0620  WBC 17.8* 15.1* 16.3* 5.8  NEUTROABS  --   --   --  4.6  HGB 8.0* 7.5* 7.7* 7.4*  HCT 25.3* 23.8* 23.9* 23.5*  MCV 96.9 96.0 97.6 95.1  PLT 185 173 188 824   Basic Metabolic Panel: Recent Labs  Lab 01/17/20 0500 01/18/20 0812  NA 137 137  K 4.1 3.6  CL 100 102  CO2 21* 21*  GLUCOSE 174* 366*  BUN 50* 74*  CREATININE 3.46* 4.73*    CALCIUM 7.7* 8.2*  PHOS 4.4 5.7*   GFR: Estimated Creatinine Clearance: 12.3 mL/min (A) (by C-G formula based on SCr of 4.73 mg/dL (H)). Liver Function Tests: Recent Labs  Lab 01/17/20 0500 01/18/20 0812  ALBUMIN 1.4* 1.4*   No results for input(s): LIPASE, AMYLASE in the last 168 hours. No results for input(s): AMMONIA in the last 168 hours. Coagulation Profile: No results for input(s): INR, PROTIME in the  last 168 hours. Cardiac Enzymes: No results for input(s): CKTOTAL, CKMB, CKMBINDEX, TROPONINI in the last 168 hours. BNP (last 3 results) No results for input(s): PROBNP in the last 8760 hours. HbA1C: No results for input(s): HGBA1C in the last 72 hours. CBG: Recent Labs  Lab 01/22/20 1618 01/22/20 1929 01/22/20 2326 01/23/20 0329 01/23/20 0720  GLUCAP 103* 118* 187* 141* 186*   Lipid Profile: No results for input(s): CHOL, HDL, LDLCALC, TRIG, CHOLHDL, LDLDIRECT in the last 72 hours. Thyroid Function Tests: No results for input(s): TSH, T4TOTAL, FREET4, T3FREE, THYROIDAB in the last 72 hours. Anemia Panel: No results for input(s): VITAMINB12, FOLATE, FERRITIN, TIBC, IRON, RETICCTPCT in the last 72 hours. Sepsis Labs: Recent Labs  Lab 01/20/20 1732 01/21/20 0500 01/22/20 0312  PROCALCITON 5.63 6.12 4.83    Recent Results (from the past 240 hour(s))  Respiratory Panel by RT PCR (Flu A&B, Covid) - Nasopharyngeal Swab     Status: None   Collection Time: 01/14/20  9:38 AM   Specimen: Nasopharyngeal Swab  Result Value Ref Range Status   SARS Coronavirus 2 by RT PCR NEGATIVE NEGATIVE Final    Comment: (NOTE) SARS-CoV-2 target nucleic acids are NOT DETECTED. The SARS-CoV-2 RNA is generally detectable in upper respiratoy specimens during the acute phase of infection. The lowest concentration of SARS-CoV-2 viral copies this assay can detect is 131 copies/mL. A negative result does not preclude SARS-Cov-2 infection and should not be used as the sole basis for  treatment or other patient management decisions. A negative result may occur with  improper specimen collection/handling, submission of specimen other than nasopharyngeal swab, presence of viral mutation(s) within the areas targeted by this assay, and inadequate number of viral copies (<131 copies/mL). A negative result must be combined with clinical observations, patient history, and epidemiological information. The expected result is Negative. Fact Sheet for Patients:  PinkCheek.be Fact Sheet for Healthcare Providers:  GravelBags.it This test is not yet ap proved or cleared by the [place] FDA and  has been authorized for detection and/or diagnosis of SARS-CoV-2 by FDA under an Emergency Use Authorization (EUA). This EUA will remain  in effect (meaning this test can be used) for the duration of the COVID-19 declaration under Section 564(b)(1) of the Act, 21 U.S.C. section 360bbb-3(b)(1), unless the authorization is terminated or revoked sooner.    Influenza A by PCR NEGATIVE NEGATIVE Final   Influenza B by PCR NEGATIVE NEGATIVE Final    Comment: (NOTE) The Xpert Xpress SARS-CoV-2/FLU/RSV assay is intended as an aid in  the diagnosis of influenza from Nasopharyngeal swab specimens and  should not be used as a sole basis for treatment. Nasal washings and  aspirates are unacceptable for Xpert Xpress SARS-CoV-2/FLU/RSV  testing. Fact Sheet for Patients: PinkCheek.be Fact Sheet for Healthcare Providers: GravelBags.it This test is not yet approved or cleared by the [place] FDA and  has been authorized for detection and/or diagnosis of SARS-CoV-2 by  FDA under an Emergency Use Authorization (EUA). This EUA will remain  in effect (meaning this test can be used) for the duration of the  Covid-19 declaration under Section 564(b)(1) of the Act, 21  U.S.C. section  360bbb-3(b)(1), unless the authorization is  terminated or revoked. Performed at [facility], [place] [address] [place], [place] [address]      Radiology Studies: No results found.  Scheduled Meds: . Chlorhexidine Gluconate Cloth  6 each Topical Q0600  . glycopyrrolate  0.3 mg Intravenous Q4H  .  HYDROmorphone (  DILAUDID) injection  0.5 mg Intravenous Q4H  . white petrolatum   Topical BID   Continuous Infusions:    LOS: 24 days   Marylu Lund, MD [facility] Pager On Amion  If 7PM-7AM, please contact night-coverage 01/23/2020, 7:04 PM

## 2020-01-23 NOTE — Plan of Care (Signed)
  Problem: Cardiac: Goal: Ability to achieve and maintain adequate cardiopulmonary perfusion will improve Outcome: Progressing   Problem: Neurologic: Goal: Promote progressive neurologic recovery Outcome: Progressing   Problem: Skin Integrity: Goal: Risk for impaired skin integrity will be minimized. Outcome: Progressing   Problem: Education: Goal: Knowledge of General Education information will improve Description: Including pain rating scale, medication(s)/side effects and non-pharmacologic comfort measures Outcome: Progressing   Problem: Health Behavior/Discharge Planning: Goal: Ability to manage health-related needs will improve Outcome: Progressing   Problem: Clinical Measurements: Goal: Ability to maintain clinical measurements within normal limits will improve Outcome: Progressing Goal: Will remain free from infection Outcome: Progressing Goal: Diagnostic test results will improve Outcome: Progressing   Problem: Activity: Goal: Risk for activity intolerance will decrease Outcome: Progressing   Problem: Nutrition: Goal: Adequate nutrition will be maintained Outcome: Progressing   Problem: Safety: Goal: Ability to remain free from injury will improve Outcome: Progressing   Problem: Skin Integrity: Goal: Risk for impaired skin integrity will decrease Outcome: Progressing

## 2020-01-23 NOTE — Progress Notes (Signed)
Pharmacy Antibiotic Note  Patricia [name] is a 64 y.o. female admitted on 01/07/2020 with aspiration pneumonia.  Pharmacy dosing Zosyn.  Of note, Renal has signed off with no plans for further dialysis   Plan: Zosyn 2.25 g IV every 8 hours  Monitor HD plans, clinical pic, cx results, and opportunities for de-escalation.   Height: 5\' 4"  (162.6 cm) Weight: 73.9 kg (162 lb 14.7 oz) IBW/kg (Calculated) : 54.7  Temp (24hrs), Avg:98.1 F (36.7 C), Min:96.7 F (35.9 C), Max:100.7 F (38.2 C)  Recent Labs  Lab 01/17/20 0500 01/17/20 1500 01/18/20 0812 01/23/20 0620  WBC 17.8* 15.1* 16.3* 5.8  CREATININE 3.46*  --  4.73*  --     Estimated Creatinine Clearance: 12.3 mL/min (A) (by C-G formula based on SCr of 4.73 mg/dL (H)).    Allergies  Allergen Reactions  . Codone [Hydrocodone] Itching  . Norvasc [Amlodipine Besylate] Swelling    Lower extremity  . Dextromethorphan-Guaifenesin Nausea And Vomiting  . Hydralazine Swelling and Other (See Comments)    Leg swelling. Pt takes this as o/p. Patient can take PO now  . Sulfa Antibiotics Itching and Rash    Antimicrobials this admission: Vanc 3/20>>3/22, restarted 3/28 >>4/1 Merrem 3/28 >> 4/3 Cefepime 3/20>>3/22 Zosyn 3/22>>3/28, 4/10>> Flagyl x 1 3/20  Dose adjustments this admission: N/A  Microbiology results: 3/20 COVID - positive 3/20 BCx - negative 3/21 MRSA - negative 3/23 RA - Enterobacter cloacae (S Fortaz, Cipro, Primaxin, Septra, Zosyn) 3/29 BCx - ?canceled 3/31 BCx (after restart abx) >> ngtd 4/1 MRSA PCR negative  Thank you for allowing pharmacy to be a part of this patient's care.  Albertina Parr, PharmD., BCPS, BCCCP Clinical Pharmacist Clinical phone for 01/23/20 until 3:30pm: [phone] If after 3:30pm, please refer to [facility] for unit-specific pharmacist

## 2020-01-23 NOTE — Progress Notes (Signed)
Patient seen this AM and had also met with family at bedside. Full note will follow. Briefly, patient is now actively dying. Now septic with fevers, high heart rate and respiratory rate despite aggressive measures. Family at bedside aware of grim prognosis and has decided on transition to full comfort measures. Orders have been placed.

## 2020-01-24 ENCOUNTER — Other Ambulatory Visit: Payer: Self-pay

## 2020-01-24 ENCOUNTER — Encounter (HOSPITAL_COMMUNITY): Payer: Self-pay | Admitting: Internal Medicine

## 2020-01-24 DIAGNOSIS — Z992 Dependence on renal dialysis: Secondary | ICD-10-CM | POA: Diagnosis not present

## 2020-01-24 DIAGNOSIS — Z66 Do not resuscitate: Secondary | ICD-10-CM | POA: Diagnosis not present

## 2020-01-24 DIAGNOSIS — R0603 Acute respiratory distress: Secondary | ICD-10-CM | POA: Diagnosis not present

## 2020-01-24 DIAGNOSIS — N186 End stage renal disease: Secondary | ICD-10-CM | POA: Diagnosis not present

## 2020-01-24 NOTE — Plan of Care (Signed)
  Problem: Skin Integrity: Goal: Risk for impaired skin integrity will be minimized. Outcome: Progressing  No new areas of skin injury noted, frequent turning provided by staff.  Problem: Neurologic: Goal: Promote progressive neurologic recovery Outcome: Not Progressing Frequent contacts and reassurance provided by staff.

## 2020-01-24 NOTE — Consult Note (Signed)
[place] Nurse wound follow up Patient receiving care in [facility] 2W26.  Patient has transitioned to full comfort care per note by Dr. Rhetta Mura.  [place] signing off.  Wound care orders in chart. Val Riles, RN, MSN, CWOCN, CNS-BC, pager [phone]

## 2020-01-24 NOTE — Progress Notes (Signed)
PROGRESS NOTE  Patricia [name]  NTZ:[id number] DOB: 11-Sep-1956 DOA: 12/12/2019 PCP: Ladell Pier, MD  Brief Narrative: Patricia [name] is a 64 y.o. female with a history of ESRD, chronic HFrEF, T2DM, HTN who was admitted after OOH cardiac arrest, intubated in ED and admitted to ICU with hypothermic protocol later developing hypotension and PEA arrest. Family meeting was held and terminal extubation was performed 4/5. Palliative care was involved due to poor prognosis for meaningful neurological recovery and the family confirmed the patient's desire would be to transition away from further dialysis and aggressive interventions, focusing on full comfort measures. She appears to be actively dying, so anticipation for hospital death in next 1-2 days.   Assessment & Plan: Active Problems:   ESRD (end stage renal disease) on dialysis [facility])   Cardiac arrest [facility])   Palliative care by specialist   DNR (do not resuscitate)   Endotracheally intubated   Anoxic brain damage ([place])   Pressure injury of skin   Dysphagia   Altered mental status   Acute respiratory distress  Cardiac arrest: Initial out to facility on 3/20-followed by repeat cardiac arrest on 3/28. Concerns of anoxic brain injury resulting from cardiac arrest as well as metabolic encephalopathy. Prognosis very poor. See below, now full comfort measures.  Anoxic brain injury: Had been seen by neurology-who do not see a meaningful chance for recovery. Since transfer from ICU, there has been no significant improvement in mentation. Patient now actively dying, transitioned to comfort care.  Toxic metabolic encephalopathy: EEG/MRI as above-supportive care continues.  ?QPR:FFMB/WGYKZLD ultrasound performed. Per Neurology, more concerns of hypoxic/anoxic brain injury with stroke likely resultant of either small vessel disease or embolic infarct in the setting of cardiac arrest. Neurology has mentioned concerns of irreversible brain injury and  grim chances for neurologically meaningful recovery  Dysphagia: Secondary to anoxic brain injury-Dr. Sloan Leiter discussed with palliative care team-who spoke with family --[name] was placed initially for nutrition. See below. Given concerns of aspiration, have since held further tube feeding. Family aware that PEG will not eliminate risk of aspiration. Per SLP patient has "Severe aspiration risk" -Now comfort care  Acute hypoxic respiratory failure in the setting of cardiac arrest:Extubated on 4/5 -Recently noted to have markedly increased resp effort and worsened hypoxemic failure -Serial CXR reviewed. Concern for rapidly worsening L opacity -Rapid response called overnight and patient was bipap dependent, now weaned to 10LNC -Given concerns of possible re-aspiration,pt had been continued on zosyn  Chronic systolic heart failure: - Deferred to Nephrology for diuresis with HD. Echo as above.  COVID-19 infection: Completed steroids/remdesivir. Per ID note (on 4/4)-patient is no longer thought to be infectious-airborne/isolation discontinued.  ESRD:Renal had been following. Pt had been receiving HD. Decision was to hold further HD with focus on comfort.   HTN: Now full comfort care  Anemia: Multifactorial-acute illness-superimposed on ESRD related anemia. No further blood draws. Comfort care only  Acute blood loss anemia:Stopped asa and anticoagulation. Now full comfort care  Thrombocytopenia: On most recent check, appears improved from prior  Aspiration pneumonia: Had previously completed a course of antibiotics. See above. Worsening hypoxemic failure with now white-out of L lung and bipap dependent Respiratory status worsened and patient now floridly septic, actively dying D/c abx and transitioned to full comfort per below  Palliative care/goals of care: DNR in place -Palliative Care following -Patient now actively dying with severe sepsis despite aggressive  treatment -Family has decided on transition to full comfort status  Nutrition Problem: Nutrition  Problem: Inadequate oral intake Etiology: acute illness Had been continued on tube feeding via [name], now held given concerns of aspiration Now comfort care  Obesity: Estimated body mass index is 27.18kg/m as calculated from the following: Height as of this encounter: 5\' 4"  (1.626 m). Weight as of this encounter: 73.5 kg.  Tachycardia: DC monitoring  Pressure sores - Per ENT, surgical correction of lip sore would likely involve painful surgery. Family has decided to hold off on surgical management of lip sore  RN Pressure Injury Documentation: Pressure Injury 01/09/20 Lip Right;Upper Deep Tissue Pressure Injury - Purple or maroon localized area of discolored intact skin or blood-filled blister due to damage of underlying soft tissue from pressure and/or shear. (Active)  01/09/20 2000  Location: Lip  Location Orientation: Right;Upper  Staging: Deep Tissue Pressure Injury - Purple or maroon localized area of discolored intact skin or blood-filled blister due to damage of underlying soft tissue from pressure and/or shear.  Wound Description (Comments):   Present on Admission: No     Pressure Injury 01/10/20 Sacrum Lower Stage 2 -  Partial thickness loss of dermis presenting as a shallow open injury with a red, pink wound bed without slough. (Active)  01/10/20 2000  Location: Sacrum  Location Orientation: Lower  Staging: Stage 2 -  Partial thickness loss of dermis presenting as a shallow open injury with a red, pink wound bed without slough.  Wound Description (Comments):   Present on Admission: No     Pressure Injury 01/20/20 Sacrum Mid Unstageable - Full thickness tissue loss in which the base of the injury is covered by slough (yellow, tan, gray, green or brown) and/or eschar (tan, brown or black) in the wound bed. Necrotic Black tissue (Active)  01/20/20 2000  Location:  Sacrum  Location Orientation: Mid  Staging: Unstageable - Full thickness tissue loss in which the base of the injury is covered by slough (yellow, tan, gray, green or brown) and/or eschar (tan, brown or black) in the wound bed.  Wound Description (Comments): Necrotic Black tissue  Present on Admission:     DVT prophylaxis: Comfort care Code Status: DNR Family Communication: None at bedside Disposition Plan: Anticipate hospital death.   Consultants:   PCCM   Neurology  Palliative Care  Nephrology  [place]  ENT  Procedures:  3/20>>4/5ET tube 3/20 CVC > 3/24 11/23/19: R IJ tunneled perm cath:  Antimicrobials:  Remdesivir, cefepime, flagyl, vancomycin  Subjective: Nonverbal, tracks me in room inconsistently.   Objective: Vitals:   01/23/20 0539 01/23/20 0724 01/23/20 0919 01/24/20 0816  BP: (!) 109/53 (!) 102/24 (!) 139/54 (!) 157/62  Pulse: (!) 110 (!) 115 (!) 115 (!) 117  Resp:  (!) 26 (!) 28 (!) 22  Temp:  98.8 F (37.1 C) (!) 100.7 F (38.2 C) (!) 100.9 F (38.3 C)  TempSrc:  Axillary Axillary Axillary  SpO2:  96% 94% 100%  Weight:      Height:       No intake or output data in the 24 hours ending 01/24/20 1718 Filed Weights   01/19/20 0652 01/22/20 0500 01/23/20 0329  Weight: 73.5 kg 74.3 kg 73.9 kg    Gen: 64 y.o. female in no acute distress Pulm: Tachypneic. Clear to auscultation bilaterally.  CV: Regular tachycardia. No murmur, rub, or gallop. No JVD, no pitting pedal edema. GI: Abdomen soft, non-tender, non-distended, with normoactive bowel sounds. No organomegaly or masses felt. Ext: Warm, no deformities Skin: Upper lip with deformity without discharge.  Neuro: Not  cooperative with exam Psych: UTD. Not agitated.  Data Reviewed: I have personally reviewed following labs and imaging studies  CBC: Recent Labs  Lab 01/18/20 0812 01/23/20 0620  WBC 16.3* 5.8  NEUTROABS  --  4.6  HGB 7.7* 7.4*  HCT 23.9* 23.5*  MCV 97.6 95.1  PLT 188 476    Basic Metabolic Panel: Recent Labs  Lab 01/18/20 0812  NA 137  K 3.6  CL 102  CO2 21*  GLUCOSE 366*  BUN 74*  CREATININE 4.73*  CALCIUM 8.2*  PHOS 5.7*   GFR: Estimated Creatinine Clearance: 12.3 mL/min (A) (by C-G formula based on SCr of 4.73 mg/dL (H)). Liver Function Tests: Recent Labs  Lab 01/18/20 0812  ALBUMIN 1.4*   No results for input(s): LIPASE, AMYLASE in the last 168 hours. No results for input(s): AMMONIA in the last 168 hours. Coagulation Profile: No results for input(s): INR, PROTIME in the last 168 hours. Cardiac Enzymes: No results for input(s): CKTOTAL, CKMB, CKMBINDEX, TROPONINI in the last 168 hours. BNP (last 3 results) No results for input(s): PROBNP in the last 8760 hours. HbA1C: No results for input(s): HGBA1C in the last 72 hours. CBG: Recent Labs  Lab 01/22/20 1618 01/22/20 1929 01/22/20 2326 01/23/20 0329 01/23/20 0720  GLUCAP 103* 118* 187* 141* 186*   Lipid Profile: No results for input(s): CHOL, HDL, LDLCALC, TRIG, CHOLHDL, LDLDIRECT in the last 72 hours. Thyroid Function Tests: No results for input(s): TSH, T4TOTAL, FREET4, T3FREE, THYROIDAB in the last 72 hours. Anemia Panel: No results for input(s): VITAMINB12, FOLATE, FERRITIN, TIBC, IRON, RETICCTPCT in the last 72 hours. Urine analysis:    Component Value Date/Time   COLORURINE AMBER (A) 12/14/2019 2122   APPEARANCEUR CLOUDY (A) 12/18/2019 2122   LABSPEC 1.019 12/20/2019 2122   PHURINE 7.0 12/29/2019 2122   GLUCOSEU 150 (A) 01/03/2020 2122   HGBUR NEGATIVE 12/13/2019 2122   BILIRUBINUR NEGATIVE 01/06/2020 2122   BILIRUBINUR 1.0 10/09/2016 1306   KETONESUR NEGATIVE 12/27/2019 2122   PROTEINUR >=300 (A) 12/14/2019 2122   UROBILINOGEN 0.2 04/24/2018 1529   NITRITE NEGATIVE 01/02/2020 2122   LEUKOCYTESUR NEGATIVE 12/28/2019 2122   No results found for this or any previous visit (from the past 240 hour(s)).    Radiology Studies: No results found.  Scheduled  Meds: . Chlorhexidine Gluconate Cloth  6 each Topical Q0600  . glycopyrrolate  0.3 mg Intravenous Q4H  .  HYDROmorphone (DILAUDID) injection  0.5 mg Intravenous Q4H  . white petrolatum   Topical BID   Continuous Infusions:   LOS: 25 days   Time spent: 25 minutes.  Patrecia Pour, MD [facility] [email or web address] 01/24/2020, 5:18 PM

## 2020-01-24 NOTE — Progress Notes (Signed)
Nutrition Brief Note  RD working remotely. Chart reviewed. Pt now transitioning to comfort care.  No further nutrition interventions warranted at this time.  Please re-consult as needed.   Loistine Chance, RD, LDN, [place] Registered Dietitian II Certified Diabetes Care and Education Specialist Please refer to [facility] for RD and/or RD on-call/weekend/after hours pager

## 2020-01-25 DIAGNOSIS — Z992 Dependence on renal dialysis: Secondary | ICD-10-CM | POA: Diagnosis not present

## 2020-01-25 DIAGNOSIS — R0603 Acute respiratory distress: Secondary | ICD-10-CM | POA: Diagnosis not present

## 2020-01-25 DIAGNOSIS — N186 End stage renal disease: Secondary | ICD-10-CM | POA: Diagnosis not present

## 2020-01-25 DIAGNOSIS — Z66 Do not resuscitate: Secondary | ICD-10-CM | POA: Diagnosis not present

## 2020-01-25 LAB — GLUCOSE, CAPILLARY: Glucose-Capillary: 319 mg/dL — ABNORMAL HIGH (ref 70–99)

## 2020-01-25 NOTE — Plan of Care (Signed)
  Problem: Cardiac: Goal: Ability to achieve and maintain adequate cardiopulmonary perfusion will improve Outcome: Not Progressing   Problem: Neurologic: Goal: Promote progressive neurologic recovery Outcome: Not Progressing   Problem: Skin Integrity: Goal: Risk for impaired skin integrity will be minimized. Outcome: Not Progressing   Problem: Education: Goal: Knowledge of General Education information will improve Description: Including pain rating scale, medication(s)/side effects and non-pharmacologic comfort measures Outcome: Not Progressing   Problem: Health Behavior/Discharge Planning: Goal: Ability to manage health-related needs will improve Outcome: Not Progressing   Problem: Clinical Measurements: Goal: Ability to maintain clinical measurements within normal limits will improve Outcome: Not Progressing Goal: Will remain free from infection Outcome: Not Progressing Goal: Diagnostic test results will improve Outcome: Not Progressing   Problem: Activity: Goal: Risk for activity intolerance will decrease Outcome: Not Progressing   Problem: Nutrition: Goal: Adequate nutrition will be maintained Outcome: Not Progressing   Problem: Safety: Goal: Ability to remain free from injury will improve Outcome: Not Progressing   Problem: Skin Integrity: Goal: Risk for impaired skin integrity will decrease Outcome: Not Progressing

## 2020-01-25 NOTE — Progress Notes (Signed)
Family refused turn via CNA. Stated that she is sleeping.

## 2020-01-25 NOTE — Progress Notes (Signed)
PROGRESS NOTE  Patricia [name]  IRJ:[id number] DOB: 1956/08/28 DOA: 12/24/2019 PCP: Ladell Pier, MD  Brief Narrative: Patricia [name] is a 64 y.o. female with a history of ESRD, chronic HFrEF, T2DM, HTN who was admitted after OOH cardiac arrest, intubated in ED and admitted to ICU with hypothermic protocol later developing hypotension and PEA arrest. Family meeting was held and terminal extubation was performed 4/5. Palliative care was involved due to poor prognosis for meaningful neurological recovery and the family confirmed the patient's desire would be to transition away from further dialysis and aggressive interventions, focusing on full comfort measures. She appears to be actively dying, so anticipation for hospital death.  Assessment & Plan: Active Problems:   ESRD (end stage renal disease) on dialysis [facility])   Cardiac arrest [facility])   Palliative care by specialist   DNR (do not resuscitate)   Endotracheally intubated   Anoxic brain damage ([place])   Pressure injury of skin   Dysphagia   Altered mental status   Acute respiratory distress  Cardiac arrest: Initial out to facility on 3/20-followed by repeat cardiac arrest on 3/28. Concerns of anoxic brain injury resulting from cardiac arrest as well as metabolic encephalopathy. Prognosis very poor. See below, now full comfort measures.  Anoxic brain injury: Had been seen by neurology-who do not see a meaningful chance for recovery. Since transfer from ICU, there has been no significant improvement in mentation. Patient now actively dying, transitioned to comfort care.  Toxic metabolic encephalopathy: EEG/MRI as above-supportive care continues.  ?TKZ:SWFU/XNATFTD ultrasound performed. Per Neurology, more concerns of hypoxic/anoxic brain injury with stroke likely resultant of either small vessel disease or embolic infarct in the setting of cardiac arrest. Neurology has mentioned concerns of irreversible brain injury and grim chances for  neurologically meaningful recovery  Dysphagia: Secondary to anoxic brain injury-Dr. Sloan Leiter discussed with palliative care team-who spoke with family --[name] was placed initially for nutrition. See below. Given concerns of aspiration, have since held further tube feeding. Family aware that PEG will not eliminate risk of aspiration. Per SLP patient has "Severe aspiration risk" -Now comfort care  Acute hypoxic respiratory failure in the setting of cardiac arrest:Extubated on 4/5 -Recently noted to have markedly increased resp effort and worsened hypoxemic failure -Serial CXR reviewed. Concern for rapidly worsening L opacity -Rapid response called overnight and patient was bipap dependent, now weaned to 10LNC -Given concerns of possible re-aspiration,pt had been continued on zosyn  Chronic systolic heart failure: - Deferred to Nephrology for diuresis with HD. Echo as above.  COVID-19 infection: Completed steroids/remdesivir. Per ID note (on 4/4)-patient is no longer thought to be infectious-airborne/isolation discontinued.  ESRD:Renal had been following. Pt had been receiving HD. Decision was to hold further HD with focus on comfort.   HTN: Now full comfort care  Anemia: Multifactorial-acute illness-superimposed on ESRD related anemia. No further blood draws. Comfort care only  Acute blood loss anemia:Stopped asa and anticoagulation. Now full comfort care  Thrombocytopenia: On most recent check, appears improved from prior  Aspiration pneumonia: Had previously completed a course of antibiotics. See above. Worsening hypoxemic failure with now white-out of L lung and bipap dependent Respiratory status worsened and patient now floridly septic, actively dying D/c abx and transitioned to full comfort per below  Palliative care/goals of care: DNR in place -Palliative Care following -Patient now actively dying with severe sepsis despite aggressive treatment -Family has  decided on transition to full comfort status  Nutrition Problem: Nutrition Problem: Inadequate oral intake Etiology:  acute illness Had been continued on tube feeding via [name], now held given concerns of aspiration Now comfort care  Obesity: Estimated body mass index is 27.18kg/m as calculated from the following: Height as of this encounter: 5\' 4"  (1.626 m). Weight as of this encounter: 73.5 kg.  Tachycardia: DC monitoring. D/w RN today to stop continuous monitoring and minimize restraints.  Pressure sores - Per ENT, surgical correction of lip sore would likely involve painful surgery. Family has decided to hold off on surgical management of lip sore  RN Pressure Injury Documentation: Pressure Injury 01/09/20 Lip Right;Upper Deep Tissue Pressure Injury - Purple or maroon localized area of discolored intact skin or blood-filled blister due to damage of underlying soft tissue from pressure and/or shear. (Active)  01/09/20 2000  Location: Lip  Location Orientation: Right;Upper  Staging: Deep Tissue Pressure Injury - Purple or maroon localized area of discolored intact skin or blood-filled blister due to damage of underlying soft tissue from pressure and/or shear.  Wound Description (Comments):   Present on Admission: No     Pressure Injury 01/10/20 Sacrum Lower Stage 2 -  Partial thickness loss of dermis presenting as a shallow open injury with a red, pink wound bed without slough. (Active)  01/10/20 2000  Location: Sacrum  Location Orientation: Lower  Staging: Stage 2 -  Partial thickness loss of dermis presenting as a shallow open injury with a red, pink wound bed without slough.  Wound Description (Comments):   Present on Admission: No     Pressure Injury 01/20/20 Sacrum Mid Unstageable - Full thickness tissue loss in which the base of the injury is covered by slough (yellow, tan, gray, green or brown) and/or eschar (tan, brown or black) in the wound bed. Necrotic  Black tissue (Active)  01/20/20 2000  Location: Sacrum  Location Orientation: Mid  Staging: Unstageable - Full thickness tissue loss in which the base of the injury is covered by slough (yellow, tan, gray, green or brown) and/or eschar (tan, brown or black) in the wound bed.  Wound Description (Comments): Necrotic Black tissue  Present on Admission:     DVT prophylaxis: Comfort care Code Status: DNR Family Communication: None at bedside. Disposition Plan: Anticipate hospital death.   Consultants:   PCCM   Neurology  Palliative Care  Nephrology  [place]  ENT  Procedures:  3/20>>4/5ET tube 3/20 CVC > 3/24 11/23/19: R IJ tunneled perm cath:  Antimicrobials:  Remdesivir, cefepime, flagyl, vancomycin  Subjective: Not verbalizing any complaints, does not appear to be in pain or short of breath.   Objective: BP (!) 182/77 (BP Location: Left Arm)   Pulse (!) 115   Temp 99.5 F (37.5 C) (Axillary)   Resp 20   Ht 5\' 4"  (1.626 m)   Wt 73.9 kg   SpO2 100%   BMI 27.97 kg/m    Gen: Ill-appearing female in no distress Pulm: Nonlabored but tachypneic, 99% on continuous pulse oximetry. +upper airway sounds. CV: Regular tachycardia. No new murmur, rub, or gallop. No JVD, no dependent edema. GI: Abdomen soft, non-tender, non-distended, with normoactive bowel sounds.  Ext: Warm, no deformities Skin: No new rashes, lesions or ulcers on visualized skin. Neuro: Opens eyes to voice, does not reliably track movements with eyes or verbally respond.  Psych: UTD  Scheduled Meds: . Chlorhexidine Gluconate Cloth  6 each Topical Q0600  . glycopyrrolate  0.3 mg Intravenous Q4H  .  HYDROmorphone (DILAUDID) injection  0.5 mg Intravenous Q4H  . white petrolatum  Topical BID   Continuous Infusions:   LOS: 26 days   Time spent: 25 minutes.  Patrecia Pour, MD [facility] [email or web address] 01/25/2020, 10:47 AM

## 2020-01-26 DIAGNOSIS — I469 Cardiac arrest, cause unspecified: Secondary | ICD-10-CM | POA: Diagnosis not present

## 2020-01-26 DIAGNOSIS — Z992 Dependence on renal dialysis: Secondary | ICD-10-CM | POA: Diagnosis not present

## 2020-01-26 DIAGNOSIS — Z515 Encounter for palliative care: Secondary | ICD-10-CM

## 2020-01-26 DIAGNOSIS — R0603 Acute respiratory distress: Secondary | ICD-10-CM | POA: Diagnosis not present

## 2020-01-26 DIAGNOSIS — N186 End stage renal disease: Secondary | ICD-10-CM | POA: Diagnosis not present

## 2020-01-26 DIAGNOSIS — Z66 Do not resuscitate: Secondary | ICD-10-CM | POA: Diagnosis not present

## 2020-01-26 DIAGNOSIS — G931 Anoxic brain damage, not elsewhere classified: Secondary | ICD-10-CM | POA: Diagnosis not present

## 2020-01-26 LAB — GLUCOSE, CAPILLARY
Glucose-Capillary: 454 mg/dL — ABNORMAL HIGH (ref 70–99)
Glucose-Capillary: 475 mg/dL — ABNORMAL HIGH (ref 70–99)

## 2020-01-26 MED ORDER — GLYCOPYRROLATE 0.2 MG/ML IJ SOLN
0.4000 mg | Freq: Four times a day (QID) | INTRAMUSCULAR | Status: DC
  Administered 2020-01-26 – 2020-01-27 (×4): 0.4 mg via INTRAVENOUS
  Filled 2020-01-26 (×5): qty 2

## 2020-01-26 MED ORDER — SODIUM CHLORIDE 0.9 % IV SOLN
0.2500 mg/h | INTRAVENOUS | Status: DC
  Filled 2020-01-26: qty 2.5

## 2020-01-26 MED ORDER — SCOPOLAMINE 1 MG/3DAYS TD PT72
1.0000 | MEDICATED_PATCH | TRANSDERMAL | Status: DC
  Administered 2020-01-26: 1.5 mg via TRANSDERMAL
  Filled 2020-01-26: qty 1

## 2020-01-26 MED ORDER — HYDROMORPHONE BOLUS VIA INFUSION
0.5000 mg | INTRAVENOUS | Status: DC | PRN
  Administered 2020-01-26 (×2): 0.5 mg via INTRAVENOUS
  Filled 2020-01-26: qty 1

## 2020-01-26 MED ORDER — SODIUM CHLORIDE 0.9 % IV SOLN
0.2500 mg/h | INTRAVENOUS | Status: DC
  Administered 2020-01-26: 0.25 mg/h via INTRAVENOUS
  Filled 2020-01-26 (×2): qty 2.5

## 2020-01-26 MED ORDER — HYDROMORPHONE BOLUS VIA INFUSION
0.5000 mg | INTRAVENOUS | Status: DC | PRN
  Administered 2020-01-26 – 2020-01-27 (×3): 0.5 mg via INTRAVENOUS
  Filled 2020-01-26: qty 1

## 2020-01-26 NOTE — Progress Notes (Signed)
Daily Progress Note   Patient Name: Patricia [name]       Date: 01/26/2020 DOB: 09-29-1956  Age: 64 y.o. MRN#: [id number] Attending Physician: Patrecia Pour, MD Primary Care Physician: Ladell Pier, MD Admit Date: 01/05/2020  Reason for Consultation/Follow-up: Non pain symptom management, Pain control, Psychosocial/spiritual support and Terminal Care    Subjective: Patient actively dying.  Both daughters and sitter are present in the room.  Daughters describe episodes of difficulty with secretions where the patient seems to choke and can't breathe for a period of time.    Daughters are surprised patient is still in her current state (as am I).  Discussed initiating a low dose dilaudid gtt to help her remain comfortable and sleep.  Discussed that during difficult episodes RN and quickly give a bolus and provide relief.  Daughters are in agreement.  Also discussed weaning down and removing oxygen.  Daughters are comfortable with this as well.   Assessment: Appears peaceful, but is tachycardic.  Actively dying.   Patient Profile/HPI:  Patient with a past medical history significant for end-stage renal disease/on hemodialysis, chronic systolic heart failure, hypertension, diabetes mellitus who was admitted on 12/15/2019 with an out of facility cardiac arrest. Family initiated CPR, EMS performed 15 minutes of CPR with return of ROSC. Patient was intubated in the emergency room, she under went hypothermic protocol but unfortunately on 01-07-27-21 developed hypotension and had a PEA arrest.  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 Decision was made with family for a one-way extubation on 01-15-20.    Length of Stay: 27   Vital Signs: BP (!) 114/48  (BP Location: Left Arm)   Pulse (!) 108   Temp 98.8 F (37.1 C)   Resp 19   Ht 5\' 4"  (1.626 m)   Wt 73.9 kg   SpO2 96%   BMI 27.97 kg/m  SpO2: SpO2: 96 % O2 Device: O2 Device: Nasal Cannula O2 Flow Rate: O2 Flow Rate (L/min): 2.5 L/min       Palliative Assessment/Data:  10%    Palliative Care Plan    Recommendations/Plan:  Wean oxygen  Initiate dilaudid gtt with PRN bolus  Increase scheduled Robinul  Please place recliner chair in the room.  Code Status:  DNR  Prognosis:   Hours - Days  Discharge Planning:  Anticipated Hospital Death  Care plan was discussed with daughters and bedside RN  Thank you for allowing the Palliative Medicine Team to assist in the care of this patient.  Total time spent:  35 min.     Greater than 50%  of this time was spent counseling and coordinating care related to the above assessment and plan.  Florentina Jenny, PA-C Palliative Medicine  Please contact Palliative MedicineTeam phone at [phone] for questions and concerns between 7 am - 7 pm.   Please see AMION for individual provider pager numbers.

## 2020-01-26 NOTE — Progress Notes (Signed)
Core trak has been removed. Patient has transitioned from prn dilaudid to a dilaudid continuous infusion.  Family feels patient is comfortable but concerned about secretions.  Patient given Robinul and order received for scope patch to start.  Family concerned about trash in room and comfort cart, trash has been emptied and new comfort cart ordered from dietary.  Advised patient's family to let me know if there is anything else I can do.

## 2020-01-26 NOTE — Progress Notes (Signed)
PROGRESS NOTE  Patricia [name]  HCW:[id number] DOB: 06-21-1956 DOA: 01/07/2020 PCP: Ladell Pier, MD  Brief Narrative: Patricia [name] is a 64 y.o. female with a history of ESRD, chronic HFrEF, T2DM, HTN who was admitted after OOH cardiac arrest, intubated in ED and admitted to ICU with hypothermic protocol later developing hypotension and PEA arrest. Family meeting was held and terminal extubation was performed 4/5. Palliative care was involved due to poor prognosis for meaningful neurological recovery and the family confirmed the patient's desire would be to transition away from further dialysis and aggressive interventions, focusing on full comfort measures. She appears to be actively dying, so anticipation for hospital death.  Assessment & Plan: Active Problems:   ESRD (end stage renal disease) on dialysis [facility])   Cardiac arrest [facility])   Palliative care by specialist   DNR (do not resuscitate)   Endotracheally intubated   Anoxic brain damage ([place])   Pressure injury of skin   Dysphagia   Altered mental status   Acute respiratory distress   Palliative care encounter  Cardiac arrest: Initial out to facility on 3/20-followed by repeat cardiac arrest on 3/28. Concerns of anoxic brain injury resulting from cardiac arrest as well as metabolic encephalopathy. Prognosis very poor. See below, now full comfort measures.  Anoxic brain injury: Had been seen by neurology-who do not see a meaningful chance for recovery. Since transfer from ICU, there has been no significant improvement in mentation. Patient now actively dying, transitioned to comfort care.  Toxic metabolic encephalopathy: EEG/MRI as above-supportive care continues.  ?VVO:HYWV/PXTGGYI ultrasound performed. Per Neurology, more concerns of hypoxic/anoxic brain injury with stroke likely resultant of either small vessel disease or embolic infarct in the setting of cardiac arrest. Neurology has mentioned concerns of irreversible brain  injury and grim chances for neurologically meaningful recovery  Dysphagia: Secondary to anoxic brain injury-Dr. Sloan Leiter discussed with palliative care team-who spoke with family --[name] was placed initially for nutrition. See below. Given concerns of aspiration, have since held further tube feeding. Family aware that PEG will not eliminate risk of aspiration. Per SLP patient has "Severe aspiration risk" -Now comfort care, remove tube.  Acute hypoxic respiratory failure in the setting of cardiac arrest:Extubated on 4/5 - Suspected to have continued aspiration with pneumonia that is unlikely to improve due to mentation. Will wean off oxygen, treat dyspnea with dilaudid gtt. D/w RN.  Chronic systolic heart failure: - Deferred to Nephrology for diuresis with HD. Echo as above. - On comfort measures  COVID-19 infection: Completed steroids/remdesivir. Per ID note (on 4/4)-patient is no longer thought to be infectious-airborne/isolation discontinued.  ESRD:Renal had been following. Pt had been receiving HD. Decision was to hold further HD with focus on comfort.   HTN: Now full comfort care  Anemia: Multifactorial-acute illness-superimposed on ESRD related anemia. No further blood draws. Comfort care only  Acute blood loss anemia:Stopped asa and anticoagulation. Now full comfort care  Thrombocytopenia: On most recent check, appears improved from prior  Aspiration pneumonia: Had previously completed a course of antibiotics. See above. Worsening hypoxemic failure with now white-out of L lung and bipap dependent Respiratory status worsened and patient now floridly septic, actively dying D/c abx and transitioned to full comfort per below  Palliative care/goals of care: DNR in place -Palliative Care following -Patient now actively dying with severe sepsis despite aggressive treatment -Family has decided on transition to full comfort status. Starting dilaudid gtt per palliative  care whichI wholeheartedly agree with.  Nutrition Problem: Nutrition Problem:  Inadequate oral intake Etiology: acute illness Had been continued on tube feeding via [name], now held given concerns of aspiration Now comfort care  Obesity: Estimated body mass index is 27.18kg/m as calculated from the following: Height as of this encounter: 5\' 4"  (1.626 m). Weight as of this encounter: 73.5 kg.  Tachycardia: DC monitoring. D/w RN today to stop continuous monitoring and minimize restraints.  Pressure sores - Per ENT, surgical correction of lip sore would likely involve painful surgery. Family has decided to hold off on surgical management of lip sore  RN Pressure Injury Documentation: Pressure Injury 01/09/20 Lip Right;Upper Deep Tissue Pressure Injury - Purple or maroon localized area of discolored intact skin or blood-filled blister due to damage of underlying soft tissue from pressure and/or shear. (Active)  01/09/20 2000  Location: Lip  Location Orientation: Right;Upper  Staging: Deep Tissue Pressure Injury - Purple or maroon localized area of discolored intact skin or blood-filled blister due to damage of underlying soft tissue from pressure and/or shear.  Wound Description (Comments):   Present on Admission: No     Pressure Injury 01/10/20 Sacrum Lower Stage 2 -  Partial thickness loss of dermis presenting as a shallow open injury with a red, pink wound bed without slough. (Active)  01/10/20 2000  Location: Sacrum  Location Orientation: Lower  Staging: Stage 2 -  Partial thickness loss of dermis presenting as a shallow open injury with a red, pink wound bed without slough.  Wound Description (Comments):   Present on Admission: No     Pressure Injury 01/20/20 Sacrum Mid Unstageable - Full thickness tissue loss in which the base of the injury is covered by slough (yellow, tan, gray, green or brown) and/or eschar (tan, brown or black) in the wound bed. Necrotic Black  tissue (Active)  01/20/20 2000  Location: Sacrum  Location Orientation: Mid  Staging: Unstageable - Full thickness tissue loss in which the base of the injury is covered by slough (yellow, tan, gray, green or brown) and/or eschar (tan, brown or black) in the wound bed.  Wound Description (Comments): Necrotic Black tissue  Present on Admission:     DVT prophylaxis: Comfort care Code Status: DNR Family Communication: Daughter at bedside Disposition Plan: Anticipate hospital death.   Consultants:   PCCM   Neurology  Palliative Care  Nephrology  [place]  ENT  Procedures:  3/20>>4/5ET tube 3/20 CVC > 3/24 11/23/19: R IJ tunneled perm cath:  Antimicrobials:  Remdesivir, cefepime, flagyl, vancomycin  Subjective: No overnight events, still has oxygen and cortrak. Family at bedside agree she could be more comfortable.  Objective: BP (!) 114/48 (BP Location: Left Arm)   Pulse (!) 108   Temp 98.8 F (37.1 C)   Resp 19   Ht 5\' 4"  (1.626 m)   Wt 73.9 kg   SpO2 96%   BMI 27.97 kg/m    Gen: 64 y.o. female in no distress Pulm: Intermittently labored, very coarse. Still on supplemental oxygen. No wheezing. CV: Regular rate and rhythm. No murmur, rub, or gallop. GI: Abdomen soft, non-tender, non-distended, with normoactive bowel sounds.  Ext: Warm, no deformities Skin: Upper lip with significant deformity, stable. Neuro: Will track with eyes intermittently, not verbal or making meaningful movements Psych: UTD.   Scheduled Meds: . Chlorhexidine Gluconate Cloth  6 each Topical Q0600  . glycopyrrolate  0.4 mg Intravenous QID  . white petrolatum   Topical BID   Continuous Infusions: . HYDROmorphone 0.25 mg/hr (01/26/20 1306)     LOS:  27 days   Time spent: 25 minutes.  Patrecia Pour, MD [facility] [email or web address] 01/26/2020, 1:31 PM

## 2020-01-26 NOTE — Progress Notes (Signed)
Trying to perform oral care x2. Pt has dry tongue with lesions. Had another nurse try to assist and still unsuccessful. Will continue to try. Able to put Vaseline on lips and take sponge to bottom of teeth x1.

## 2020-01-26 NOTE — Progress Notes (Signed)
Re-rounded this afternoon.  Patient having worsening secretions, tachycardia and work of breathing.    Discussed with daughters, sister and mother in law in the room.  Initiated a scope patch.  Provided a range on dilaudid gtt and PRN boluses so that this medication can be titrated to patient's comfort.  Will follow up in am.  Florentina Jenny, PA-C Palliative Medicine Office:  [phone]  No charge.

## 2020-01-27 DIAGNOSIS — Z992 Dependence on renal dialysis: Secondary | ICD-10-CM | POA: Diagnosis not present

## 2020-01-27 DIAGNOSIS — G931 Anoxic brain damage, not elsewhere classified: Secondary | ICD-10-CM | POA: Diagnosis not present

## 2020-01-27 DIAGNOSIS — I469 Cardiac arrest, cause unspecified: Secondary | ICD-10-CM | POA: Diagnosis not present

## 2020-01-27 DIAGNOSIS — R0603 Acute respiratory distress: Secondary | ICD-10-CM | POA: Diagnosis not present

## 2020-01-27 DIAGNOSIS — Z66 Do not resuscitate: Secondary | ICD-10-CM | POA: Diagnosis not present

## 2020-01-27 DIAGNOSIS — Z515 Encounter for palliative care: Secondary | ICD-10-CM | POA: Diagnosis not present

## 2020-01-27 DIAGNOSIS — N186 End stage renal disease: Secondary | ICD-10-CM | POA: Diagnosis not present

## 2020-01-27 LAB — GLUCOSE, CAPILLARY
Glucose-Capillary: 463 mg/dL — ABNORMAL HIGH (ref 70–99)
Glucose-Capillary: 467 mg/dL — ABNORMAL HIGH (ref 70–99)

## 2020-01-27 MED ORDER — HYDROMORPHONE BOLUS VIA INFUSION: 0.5000 mg | INTRAVENOUS | Status: DC | PRN

## 2020-01-27 MED ORDER — GLYCOPYRROLATE 0.2 MG/ML IJ SOLN: 0.4000 mg | Freq: Four times a day (QID) | INTRAMUSCULAR | Status: DC | PRN

## 2020-01-27 MED ORDER — HYDROMORPHONE BOLUS VIA INFUSION
0.5000 mg | INTRAVENOUS | Status: DC | PRN
  Filled 2020-01-27: qty 2

## 2020-01-27 MED ORDER — KETOROLAC TROMETHAMINE 15 MG/ML IJ SOLN: 15.0000 mg | Freq: Four times a day (QID) | INTRAMUSCULAR | Status: DC

## 2020-01-27 MED ORDER — FUROSEMIDE 10 MG/ML IJ SOLN: 40.0000 mg | Freq: Once | INTRAMUSCULAR | Status: DC

## 2020-01-27 MED ORDER — KETOROLAC TROMETHAMINE 30 MG/ML IJ SOLN
30.0000 mg | Freq: Once | INTRAMUSCULAR | Status: AC
  Administered 2020-01-27: 30 mg via INTRAVENOUS
  Filled 2020-01-27: qty 1

## 2020-01-27 MED ORDER — ATROPINE SULFATE 1 % OP SOLN
2.0000 [drp] | Freq: Three times a day (TID) | OPHTHALMIC | Status: DC
  Filled 2020-01-27: qty 2

## 2020-02-02 ENCOUNTER — Ambulatory Visit: Payer: Medicare HMO | Admitting: Internal Medicine

## 2020-02-10 NOTE — Death Summary Note (Signed)
DEATH SUMMARY   Patient Details  Name: Patricia [name] MRN: [id number] DOB: [birth year]  Admission/Discharge Information   Admit Date:  01-04-2020  Date of Death: Date of Death: 2020/02/01  Time of Death: Time of Death: 01/07/19  Length of Stay: 01/12/23  Referring Physician: Ladell Pier, MD   Reason(s) for Hospitalization  Cardiac arrest  Diagnoses  Preliminary cause of death:  Cardiac arrest, anoxic brain injury, acute respiratory failure  Secondary Diagnoses (including complications and co-morbidities):  Active Problems:   ESRD (end stage renal disease) on dialysis [facility])   Cardiac arrest ([place])   Palliative care by specialist   DNR (do not resuscitate) present on admission   Endotracheally intubated   Anoxic brain damage ([place])   Pressure injury of skin   Dysphagia   Altered mental status   Acute respiratory distress   Palliative care encounter   Terminal care Covid-19 infection Chronic systolic heart failure Aspiration pneumonia Acute hypoxemic respiratory failure Thrombocytopenia Severe protein calorie malnutrition Pressure ulcer of upper lip from endotracheal tube   Brief Hospital Course (including significant findings, care, treatment, and services provided and events leading to death)  Patricia [name] is a 64 y.o. female with a history of ESRD, chronic HFrEF, T2DM, HTN who was brought in after OOH cardiac arrest, intubated in ED and admitted to ICU with hypothermic protocol later developing hypotension and PEA arrest. Family meeting was held and terminal extubation was performed 4/5. Palliative care was involved due to poor prognosis for meaningful neurological recovery and the family confirmed the patient's desire would be to transition away from further dialysis and aggressive interventions, focusing on full comfort measures. She appeared to be actively dying, so transfer to residential hospice was not sought. With dilaudid infusion, the patient's symptoms appeared  well-controlled and the patient passed February 01, 2023.  Pertinent Labs and Studies  Significant Diagnostic Studies CT Head Wo Contrast  Result Date: January 04, 2020 CLINICAL DATA:  Post CPR EXAM: CT HEAD WITHOUT CONTRAST TECHNIQUE: Contiguous axial images were obtained from the base of the skull through the vertex without intravenous contrast. COMPARISON:  11/18/2019 FINDINGS: Brain: There is no acute intracranial hemorrhage, mass effect, or edema. Gray-white differentiation is preserved. There is no extra-axial fluid collection. Ventricles and sulci are within normal limits in size and configuration. Vascular: There is atherosclerotic calcification at the skull base. Skull: Calvarium is unremarkable. Sinuses/Orbits: Patchy mucosal thickening. No acute orbital finding. Other: Mastoid air cells are clear. Retained secretions in the posterior nasal cavity and included pharynx. IMPRESSION: No acute intracranial hemorrhage or evidence of acute infarction. Electronically Signed   By: Macy Mis M.D.   On: Jan 04, 2020 21:35   MR BRAIN WO CONTRAST  Addendum Date: 01/06/2020   ADDENDUM REPORT: 01/06/2020 10:36 ADDENDUM: The case was discussed and reviewed in more depth with Dr. Rory Percy. Diffusion-weighted imaging appears positive in both cerebellar hemispheres. This suggests recent ischemia. In addition, there is FLAIR hyperintensity in the medial and posterior thalamus bilaterally without restricted diffusion which may reflect recent ischemia. Given the history of CPR, findings are likely due to anoxic brain injury. Follow-up MRI may be helpful. Electronically Signed   By: Franchot Gallo M.D.   On: 01/06/2020 10:36   Result Date: 01/06/2020 CLINICAL DATA:  Anoxic brain injury.  ESRD with CPR EXAM: MRI HEAD WITHOUT CONTRAST TECHNIQUE: Multiplanar, multiecho pulse sequences of the brain and surrounding structures were obtained without intravenous contrast. COMPARISON:  CT head Jan 04, 2020 FINDINGS: Brain: Small area of acute  infarct in the right  occipital cortex. No other acute infarct. Mild chronic microvascular ischemic changes in the pons. Several small areas of chronic microhemorrhage in the brain. Negative for mass or edema. Ventricle size normal. Vascular: Normal arterial flow voids. Skull and upper cervical spine: Negative Sinuses/Orbits: Patient is intubated with secretions in the pharynx. Mild mucosal edema paranasal sinuses. Bilateral cataract surgery Other: None IMPRESSION: Small area of acute infarct in the right occipital cortex. This is more likely to be an embolus than anoxic brain injury. Mild chronic microvascular ischemic changes in the white matter. Scattered areas of microhemorrhage in the brain correlate with hypertension history. Electronically Signed: By: Franchot Gallo M.D. On: 01/05/2020 14:09   DG Chest Port 1 View  Result Date: 01/20/2020 CLINICAL DATA:  64 year old female with rapid response and shortness of breath. EXAM: PORTABLE CHEST 1 VIEW COMPARISON:  Chest radiograph dated 1421. FINDINGS: Dialysis catheter, right IJ central venous line, and partially visualized feeding tube extending below the diaphragm as seen previously. There is opacification of the majority of the left lung significantly progressed since the prior radiograph. A small left pleural effusion noted. There is overall loss of left lung volume with shift of the mediastinum into the left hemithorax indicative of atelectasis or collapse. The right lung is clear. There is no pneumothorax. Atherosclerotic calcification of the aorta. No acute osseous pathology. IMPRESSION: 1. Opacification of the majority of the left lung most consistent with atelectasis or collapse. There is associated decreased left lung volume and shift of the mediastinum into the left hemithorax. 2. Small left pleural effusion. Electronically Signed   By: Anner Crete M.D.   On: 01/20/2020 21:42   DG CHEST PORT 1 VIEW  Result Date: 01/20/2020 CLINICAL DATA:   End-stage renal disease. EXAM: PORTABLE CHEST 1 VIEW COMPARISON:  January 18, 2020 FINDINGS: Support apparatus is stable. No pneumothorax. There is infiltrate left perihilar region, more pronounced in the interval. The lungs are otherwise clear. The cardiomediastinal silhouette is stable. IMPRESSION: There is opacity in left perihilar region, more pronounced in the interval. This could represent pneumonia or atelectasis. Recommend short-term follow-up to ensure resolution. Electronically Signed   By: Dorise Bullion III M.D   On: 01/20/2020 15:00   DG CHEST PORT 1 VIEW  Result Date: 01/18/2020 CLINICAL DATA:  Tachypnea EXAM: PORTABLE CHEST 1 VIEW COMPARISON:  01/07/2020 FINDINGS: Feeding tube, dialysis catheter and central venous line unchanged. Interval extubation. Low lung volumes. No effusion, infiltrate pneumothorax. IMPRESSION: 1. extubation without complication. 2. Stable support apparatus otherwise. Electronically Signed   By: Suzy Bouchard M.D.   On: 01/18/2020 14:22   DG CHEST PORT 1 VIEW  Result Date: 01/07/2020 CLINICAL DATA:  Status post central line placement EXAM: PORTABLE CHEST 1 VIEW COMPARISON:  January 07, 2020 FINDINGS: The tunneled dialysis catheter is stable in positioning. There is a new right-sided central venous catheter with tip terminating near the cavoatrial junction. There is no evidence for right-sided pneumothorax. The endotracheal tube terminates approximately 4.1 cm above the carina. The enteric tube extends below the left hemidiaphragm. There is some atelectasis at the lung bases without evidence for a focal infiltrate or large pleural effusion. IMPRESSION: 1. Lines and tubes as above.  No evidence for pneumothorax. 2. Otherwise, stable appearance of the chest. Electronically Signed   By: Constance Holster M.D.   On: 01/07/2020 21:40   DG CHEST PORT 1 VIEW  Result Date: 01/07/2020 CLINICAL DATA:  Pneumonia EXAM: PORTABLE CHEST 1 VIEW COMPARISON:  01/02/2020 FINDINGS:  Endotracheal tube with  the tip 19 mm above the carina. Nasogastric tube coursing below the diaphragm. Dual lumen right-sided central venous catheter with the tip projecting over the SVC. No focal consolidation, pleural effusion or pneumothorax. Stable cardiomediastinal silhouette. No aggressive osseous lesion. IMPRESSION: Support lines tubing in satisfactory position. No acute cardiopulmonary disease. Electronically Signed   By: Kathreen Devoid   On: 01/07/2020 08:01   DG CHEST PORT 1 VIEW  Result Date: 01/02/2020 CLINICAL DATA:  Intubated. EXAM: PORTABLE CHEST 1 VIEW COMPARISON:  12/31/2019 FINDINGS: The endotracheal tube is 1.6 cm above the carina. The NG tube is coursing down the esophagus and into the stomach. The right IJ dialysis catheter is stable and the right IJ central venous catheter is stable. The tip is in the right atrium. Persistent left basilar process, likely a combination of effusion and atelectasis. The right lung remains clear. IMPRESSION: 1. Stable support apparatus. 2. Persistent left basilar process. Electronically Signed   By: Marijo Sanes M.D.   On: 01/02/2020 10:50   DG Chest Port 1 View  Result Date: 12/31/2019 CLINICAL DATA:  Initial evaluation for central line placement. EXAM: PORTABLE CHEST 1 VIEW COMPARISON:  Prior radiograph from 01/10/2020. FINDINGS: Endotracheal tube remains in place, tip position 2.6 cm above the carina. Interval placement of a right IJ approach central venous catheter with tip overlying the proximal right atrium. Right-sided hemodialysis catheter stable. Enteric tube courses into the abdomen. Cardiomegaly again noted. Mediastinal silhouette within normal limits. Aortic atherosclerosis. Lungs hypoinflated. Patchy and dense opacity at the left lung base could reflect atelectasis and/or infiltrate. No obvious pleural effusion. No pulmonary edema. No pneumothorax. Osseous structures are unchanged. IMPRESSION: 1. Interval placement of right IJ approach central  venous catheter with tip overlying the proximal right atrium. 2. Remaining support apparatus in satisfactory position. 3. Shallow lung inflation with patchy and dense left basilar opacity, which could reflect atelectasis and/or infiltrate. Electronically Signed   By: Jeannine Boga M.D.   On: 12/31/2019 05:43   DG Chest Portable 1 View  Result Date: 12/17/2019 CLINICAL DATA:  Post resuscitation. EXAM: PORTABLE CHEST 1 VIEW COMPARISON:  Chest radiograph 11/18/2019 FINDINGS: Interval intubation with endotracheal tube tip approximately 3 cm above the carina. Interval placement of a nasogastric tube with distal aspect below the diaphragm out of field of view. Right central venous catheter tip projects at the cavoatrial junction. A chest pad overlies the left chest. Stable cardiomediastinal contours. Mild central congestion without overt edema. No evidence of pneumothorax or large pleural effusion. No acute finding in the visualized skeleton. IMPRESSION: 1. Interval intubation and placement of a nasogastric tube as above. 2. Central vascular congestion without overt edema. Electronically Signed   By: Audie Pinto M.D.   On: 01/07/2020 20:53   EEG adult  Result Date: 12/31/2019 Alexis Goodell, MD     12/31/2019  3:50 PM ELECTROENCEPHALOGRAM REPORT Patient: LITISHA GUAGLIARDO       Room #: 5Y85O EEG No. ID: [id number] Age: 64 y.o.        Sex: female Requesting Physician: Maryjean Ka Report Date:  12/31/2019       Interpreting Physician: Alexis Goodell History: DALLIS DARDEN is an 64 y.o. female s/p arrest Medications: Nimbex, Fentanyl, Versed, Levophed, Vancomycin, Cefepime, ASA, Dexamethasone, Remdesivir Conditions of Recording:  This is a 21 channel routine scalp EEG performed with bipolar and monopolar montages arranged in accordance to the international 10/20 system of electrode placement. One channel was dedicated to EKG recording. The patient is in the intubated, sedated  and paralyzed state. Description:  The  background activity is markedly attenuated due to low amplitude at sensitivity of 7uV/mm.  When sensitivity is increased to 3uV/mm a very low voltage polymorphic delta activity can be appreciated that is continuous and diffusely distributed.  No epileptiform activity is noted.  The is no activation of the background noted with stimulation. Hyperventilation and intermittent photic stimulation were not performed. IMPRESSION: This is an abnormal EEG secondary to general background that is slow and markedly attenuated.  This finding may be seen with a diffuse disturbance that is etiologically nonspecific, but may include a metabolic encephalopathy or medication effect, among other possibilities.  No epileptiform activity is noted.  Alexis Goodell, MD Neurology [phone] 12/31/2019, 3:43 PM   Overnight EEG with video  Result Date: 01/01/2020 Lora Havens, MD     01/02/2020  8:34 AM Patient Name: TNYA ADES MRN: [id number] Epilepsy Attending: Lora Havens Referring Physician/Provider: Dr. Mickel Baas Gleason Duration: 12/31/2019 1353 to 01/01/2020 1353 Patient history: 64 year old female presented with cardiac arrest on TTM.  EEG to evaluate for seizures. Level of alertness: Comatose AEDs during EEG study: Versed Technical aspects: This EEG study was done with scalp electrodes positioned according to the 10-20 International system of electrode placement. Electrical activity was acquired at a sampling rate of 500Hz  and reviewed with a high frequency filter of 70Hz  and a low frequency filter of 1Hz . EEG data were recorded continuously and digitally stored. Description: EEG showed continuous generalized background attenuation.  At times, generalized 3 to 5 Hz theta-delta slowing was noted.  Reactivity was not tested during the study.  Hyperventilation and photic stimulation were not performed. Abnormality - Background attenuation, generalized - Intermittent slow, generalized IMPRESSION: This study is suggestive of  profound diffuse encephalopathy, nonspecific to etiology. No seizures or epileptiform discharges were seen throughout the recording.   ECHOCARDIOGRAM COMPLETE  Result Date: 12/31/2019    ECHOCARDIOGRAM REPORT   Patient Name:   NYLIAH NIERENBERG Date of Exam: 12/31/2019 Medical Rec #:  [id number]    Height:       64.0 in Accession #:    [id number]   Weight:       144.2 lb Date of Birth:  01-13-1956   BSA:          1.702 m Patient Age:    56 years     BP:           139/61 mmHg Patient Gender: F            HR:           43 bpm. Exam Location:  Inpatient Procedure: 2D Echo, 3D Echo, Color Doppler, Cardiac Doppler and Strain Analysis Indications:    Cardiac arrest i46.9  History:        Patient has prior history of Echocardiogram examinations, most                 recent 12/05/2018. CHF; Risk Factors:Hypertension, Diabetes and                 Dyslipidemia. COVID+ 12/22/2019.  Sonographer:    Raquel Sarna Senior RDCS Referring Phys: [id number] Francesca Jewett  Sonographer Comments: Echo performed with patient supine and on artificial respirator. IMPRESSIONS  1. Left ventricular ejection fraction, by estimation, is 30 to 35%. The left ventricle has moderately decreased function. The left ventricle has no regional wall motion abnormalities. There is moderate left ventricular hypertrophy. Left ventricular diastolic parameters are consistent with Grade I diastolic dysfunction (  impaired relaxation). The average left ventricular global longitudinal strain is -10.2 %.  2. Right ventricular systolic function is normal. The right ventricular size is normal. There is normal pulmonary artery systolic pressure.  3. Left atrial size was mildly dilated.  4. The mitral valve is normal in structure. Mild mitral valve regurgitation. No evidence of mitral stenosis.  5. The aortic valve is tricuspid. Aortic valve regurgitation is trivial. No aortic stenosis is present.  6. The inferior vena cava is normal in size with greater than 50% respiratory  variability, suggesting right atrial pressure of 3 mmHg. Comparison(s): The left ventricular function is worsened. FINDINGS  Left Ventricle: Left ventricular ejection fraction, by estimation, is 30 to 35%. The left ventricle has moderately decreased function. The left ventricle has no regional wall motion abnormalities. The average left ventricular global longitudinal strain is -10.2 %. The left ventricular internal cavity size was normal in size. There is moderate left ventricular hypertrophy. Left ventricular diastolic parameters are consistent with Grade I diastolic dysfunction (impaired relaxation). Right Ventricle: The right ventricular size is normal. No increase in right ventricular wall thickness. Right ventricular systolic function is normal. There is normal pulmonary artery systolic pressure. The tricuspid regurgitant velocity is 1.53 m/s, and  with an assumed right atrial pressure of 8 mmHg, the estimated right ventricular systolic pressure is 42.5 mmHg. Left Atrium: Left atrial size was mildly dilated. Right Atrium: Right atrial size was normal in size. Pericardium: A small pericardial effusion is present. The pericardial effusion is posterior to the left ventricle. Mitral Valve: The mitral valve is normal in structure. Normal mobility of the mitral valve leaflets. Mild mitral valve regurgitation. No evidence of mitral valve stenosis. Tricuspid Valve: The tricuspid valve is normal in structure. Tricuspid valve regurgitation is mild . No evidence of tricuspid stenosis. Aortic Valve: The aortic valve is tricuspid. Aortic valve regurgitation is trivial. No aortic stenosis is present. Pulmonic Valve: The pulmonic valve was normal in structure. Pulmonic valve regurgitation is not visualized. No evidence of pulmonic stenosis. Aorta: The aortic root is normal in size and structure. Venous: The inferior vena cava is normal in size with greater than 50% respiratory variability, suggesting right atrial pressure of  3 mmHg. IAS/Shunts: No atrial level shunt detected by color flow Doppler.  LEFT VENTRICLE PLAX 2D LVIDd:         4.20 cm      Diastology LVIDs:         3.40 cm      LV e' lateral:   3.94 cm/s LV PW:         1.50 cm      LV E/e' lateral: 10.2 LV IVS:        1.50 cm      LV e' medial:    3.49 cm/s LVOT diam:     2.00 cm      LV E/e' medial:  11.5 LV SV:         40 LV SV Index:   23           2D Longitudinal Strain LVOT Area:     3.14 cm     2D Strain GLS Avg:     -10.2 %  LV Volumes (MOD) LV vol d, MOD A2C: 112.0 ml LV vol d, MOD A4C: 129.0 ml LV vol s, MOD A2C: 83.6 ml LV vol s, MOD A4C: 92.6 ml LV SV MOD A2C:     28.4 ml LV SV MOD A4C:  129.0 ml LV SV MOD BP:      35.8 ml RIGHT VENTRICLE RV S prime:     6.98 cm/s TAPSE (M-mode): 2.3 cm LEFT ATRIUM             Index       RIGHT ATRIUM           Index LA diam:        3.20 cm 1.88 cm/m  RA Area:     13.90 cm LA Vol (A2C):   58.5 ml 34.36 ml/m RA Volume:   33.70 ml  19.80 ml/m LA Vol (A4C):   59.8 ml 35.13 ml/m LA Biplane Vol: 63.5 ml 37.30 ml/m  AORTIC VALVE LVOT Vmax:   46.30 cm/s LVOT Vmean:  33.200 cm/s LVOT VTI:    0.127 m  AORTA Ao Root diam: 2.60 cm Ao Asc diam:  2.70 cm MITRAL VALVE               TRICUSPID VALVE MV Area (PHT): 3.27 cm    TR Peak grad:   9.4 mmHg MV Decel Time: 232 msec    TR Vmax:        153.00 cm/s MV E velocity: 40.30 cm/s MV A velocity: 50.60 cm/s  SHUNTS MV E/A ratio:  0.80        Systemic VTI:  0.13 m                            Systemic Diam: 2.00 cm Candee Furbish MD Electronically signed by Candee Furbish MD Signature Date/Time: 12/31/2019/2:21:59 PM    Final    VAS US CAROTID  Result Date: 01/08/2020 Carotid Arterial Duplex Study Indications:       CVA and Covid +. Risk Factors:      Hypertension, Diabetes. Limitations        Today's exam was limited due to a central line and patient on                    a ventilator. Comparison Study:  No prior exam. Performing Technologist: Baldwin Crown ARDMS, RVT  Examination Guidelines:  A complete evaluation includes B-mode imaging, spectral Doppler, color Doppler, and power Doppler as needed of all accessible portions of each vessel. Bilateral testing is considered an integral part of a complete examination. Limited examinations for reoccurring indications may be performed as noted.  Right Carotid Findings: +----------+--------+--------+--------+--------------------+-------------------+           PSV cm/sEDV cm/sStenosisPlaque Description  Comments            +----------+--------+--------+--------+--------------------+-------------------+ CCA Prox  87      12                                                      +----------+--------+--------+--------+--------------------+-------------------+ CCA Distal67      13                                  poorly visualized  due to IV line and                                                        bandages            +----------+--------+--------+--------+--------------------+-------------------+ ICA Prox  36      12      1-39%   heterogenous and                                                          calcific                                +----------+--------+--------+--------+--------------------+-------------------+ ICA Distal99      30                                  poorly visualized                                                         due to IV line and                                                        bandages            +----------+--------+--------+--------+--------------------+-------------------+ ECA       44      7                                                       +----------+--------+--------+--------+--------------------+-------------------+ +----------+--------+-------+----------------+-------------------+           PSV cm/sEDV cmsDescribe        Arm Pressure (mmHG)  +----------+--------+-------+----------------+-------------------+ [id number]             Multiphasic, WNL                    +----------+--------+-------+----------------+-------------------+ +---------+--------+--+--------+--+--------+ VertebralPSV cm/s58EDV cm/s10Atypical +---------+--------+--+--------+--+--------+  Left Carotid Findings: +----------+-------+--------+--------+-----------------------+----------------+           PSV    EDV cm/sStenosisPlaque Description     Comments                   cm/s                                                           +----------+-------+--------+--------+-----------------------+----------------+ CCA Prox  87  14                                                      +----------+-------+--------+--------+-----------------------+----------------+ CCA Distal48     9                                                       +----------+-------+--------+--------+-----------------------+----------------+ ICA Prox  25     9       Normal  heterogenous and       atypical                                          calcific               waveform         +----------+-------+--------+--------+-----------------------+----------------+ ICA Distal36     12                                                      +----------+-------+--------+--------+-----------------------+----------------+ ECA       49     8                                                       +----------+-------+--------+--------+-----------------------+----------------+ +----------+--------+--------+----------------+-------------------+           PSV cm/sEDV cm/sDescribe        Arm Pressure (mmHG) +----------+--------+--------+----------------+-------------------+ [id number]             Multiphasic, WNL                    +----------+--------+--------+----------------+-------------------+ +---------+--------+--+--------+--+---------+  VertebralPSV cm/s96EDV cm/s24Antegrade +---------+--------+--+--------+--+---------+   Summary: Right Carotid: Velocities in the right ICA are consistent with a 1-39% stenosis.                Atypical and low velocity waveforms visualized. Left Carotid: Velocities in the left ICA are consistent with a 1-39% stenosis.               Atypical and low velocity waveforms visualized. Vertebrals:  Bilateral vertebral arteries demonstrate antegrade flow. Subclavians: Normal flow hemodynamics were seen in bilateral subclavian              arteries. *See table(s) above for measurements and observations.  Electronically signed by Antony Contras MD on 01/08/2020 at 12:48:39 PM.    Final     Microbiology No results found for this or any previous visit (from the past 240 hour(s)).  Lab Basic Metabolic Panel: No results for input(s): NA, K, CL, CO2, GLUCOSE, BUN, CREATININE, CALCIUM, MG, PHOS in the last 168 hours. Liver Function Tests: No results for input(s): AST, ALT, ALKPHOS, BILITOT, PROT, ALBUMIN in the last 168 hours. No results for input(s): LIPASE, AMYLASE in the last 168 hours.  No results for input(s): AMMONIA in the last 168 hours. CBC: Recent Labs  Lab 01/23/20 0620  WBC 5.8  NEUTROABS 4.6  HGB 7.4*  HCT 23.5*  MCV 95.1  PLT 207   Cardiac Enzymes: No results for input(s): CKTOTAL, CKMB, CKMBINDEX, TROPONINI in the last 168 hours. Sepsis Labs: Recent Labs  Lab 01/20/20 1732 01/21/20 0500 01/22/20 0312 01/23/20 0620  PROCALCITON 5.63 6.12 4.83  --   WBC  --   --   --  5.8    Procedures/Operations  3/20>>4/5ET tube 3/20 CVC > 3/24 11/23/19: R IJ tunneled perm cath:  Patrecia Pour, MD 2020-01-28, 1:04 PM

## 2020-02-10 NOTE — Progress Notes (Signed)
Pt confirmed with no respirations/heart rate by this nurse and charge nurse [place]. No apparent distress. MD paged to make aware.

## 2020-02-10 NOTE — Progress Notes (Signed)
Palliative called d/t pt increase in work with breathing/upper airway crackles.

## 2020-02-10 NOTE — Progress Notes (Signed)
Daughters called to make aware.

## 2020-02-10 NOTE — Progress Notes (Signed)
PROGRESS NOTE  Patricia [name]  XFG:[id number] DOB: 1956-01-19 DOA: 12/11/2019 PCP: Ladell Pier, MD  Brief Narrative: Patricia [name] is a 64 y.o. female with a history of ESRD, chronic HFrEF, T2DM, HTN who was admitted after OOH cardiac arrest, intubated in ED and admitted to ICU with hypothermic protocol later developing hypotension and PEA arrest. Family meeting was held and terminal extubation was performed 4/5. Palliative care was involved due to poor prognosis for meaningful neurological recovery and the family confirmed the patient's desire would be to transition away from further dialysis and aggressive interventions, focusing on full comfort measures. She appears to be actively dying, so anticipation for hospital death.  Assessment & Plan: Active Problems:   ESRD (end stage renal disease) on dialysis [facility])   Cardiac arrest [facility])   Palliative care by specialist   DNR (do not resuscitate)   Endotracheally intubated   Anoxic brain damage ([place])   Pressure injury of skin   Dysphagia   Altered mental status   Acute respiratory distress   Palliative care encounter   Terminal care  Cardiac arrest: Initial out to facility on 3/20-followed by repeat cardiac arrest on 3/28. Concerns of anoxic brain injury resulting from cardiac arrest as well as metabolic encephalopathy. Prognosis very poor. See below, now full comfort measures.  Anoxic brain injury: Had been seen by neurology-who do not see a meaningful chance for recovery. Since transfer from ICU, there has been no significant improvement in mentation. Patient now actively dying, transitioned to comfort care.  Toxic metabolic encephalopathy: EEG/MRI as above-supportive care continues.  ?RCV:ELFY/BOFBPZW ultrasound performed. Per Neurology, more concerns of hypoxic/anoxic brain injury with stroke likely resultant of either small vessel disease or embolic infarct in the setting of cardiac arrest. Neurology has mentioned concerns of  irreversible brain injury and grim chances for neurologically meaningful recovery  Dysphagia: Secondary to anoxic brain injury-Dr. Sloan Leiter discussed with palliative care team-who spoke with family --[name] was placed initially for nutrition. See below. Given concerns of aspiration, have since held further tube feeding. Family aware that PEG will not eliminate risk of aspiration. Per SLP patient has "Severe aspiration risk" -Now comfort care, remove tube.  Acute hypoxic respiratory failure in the setting of cardiac arrest:Extubated on 4/5 - Suspected to have continued aspiration with pneumonia that is unlikely to improve due to mentation.  - continue to treat dyspnea/tachypnea, distress with dilaudid gtt.    Chronic systolic heart failure: - Deferred to Nephrology for diuresis with HD. Echo as above. - On comfort measures  COVID-19 infection: Completed steroids/remdesivir. Per ID note (on 4/4)-patient is no longer thought to be infectious-airborne/isolation discontinued.  ESRD:Renal had been following. Pt had been receiving HD. Decision was to hold further HD with focus on comfort.   HTN: Now full comfort care  Anemia: Multifactorial-acute illness-superimposed on ESRD related anemia. No further blood draws. Comfort care only  Acute blood loss anemia:Stopped asa and anticoagulation. Now full comfort care  Thrombocytopenia: On most recent check, appears improved from prior  Aspiration pneumonia: Had previously completed a course of antibiotics. See above. Worsening hypoxemic failure with now white-out of L lung and bipap dependent Respiratory status worsened and patient now floridly septic, actively dying D/c abx and transitioned to full comfort per below  Palliative care/goals of care: DNR in place -Palliative Care following -Patient now actively dying with severe sepsis despite aggressive treatment -Family has decided on transition to full comfort status. Starting  dilaudid gtt per palliative care whichI wholeheartedly agree with.  Nutrition Problem: Nutrition Problem: Inadequate oral intake Etiology: acute illness Had been continued on tube feeding via [name], now held given concerns of aspiration Now comfort care  Obesity: Estimated body mass index is 27.18kg/m as calculated from the following: Height as of this encounter: 5\' 4"  (1.626 m). Weight as of this encounter: 73.5 kg.  Tachycardia: DC monitoring. D/w RN today to stop continuous monitoring and minimize restraints.  Pressure sores - Per ENT, surgical correction of lip sore would likely involve painful surgery. Family has decided to hold off on surgical management of lip sore  RN Pressure Injury Documentation: Pressure Injury 01/09/20 Lip Right;Upper Deep Tissue Pressure Injury - Purple or maroon localized area of discolored intact skin or blood-filled blister due to damage of underlying soft tissue from pressure and/or shear. (Active)  01/09/20 2000  Location: Lip  Location Orientation: Right;Upper  Staging: Deep Tissue Pressure Injury - Purple or maroon localized area of discolored intact skin or blood-filled blister due to damage of underlying soft tissue from pressure and/or shear.  Wound Description (Comments):   Present on Admission: No     Pressure Injury 01/10/20 Sacrum Lower Stage 2 -  Partial thickness loss of dermis presenting as a shallow open injury with a red, pink wound bed without slough. (Active)  01/10/20 2000  Location: Sacrum  Location Orientation: Lower  Staging: Stage 2 -  Partial thickness loss of dermis presenting as a shallow open injury with a red, pink wound bed without slough.  Wound Description (Comments):   Present on Admission: No     Pressure Injury 01/20/20 Sacrum Mid Unstageable - Full thickness tissue loss in which the base of the injury is covered by slough (yellow, tan, gray, green or brown) and/or eschar (tan, brown or black) in the  wound bed. Necrotic Black tissue (Active)  01/20/20 2000  Location: Sacrum  Location Orientation: Mid  Staging: Unstageable - Full thickness tissue loss in which the base of the injury is covered by slough (yellow, tan, gray, green or brown) and/or eschar (tan, brown or black) in the wound bed.  Wound Description (Comments): Necrotic Black tissue  Present on Admission:     DVT prophylaxis: Comfort care Code Status: DNR Family Communication: None at bedside this AM. Disposition Plan: Anticipate hospital death.   Consultants:   PCCM   Neurology  Palliative Care  Nephrology  [place]  ENT  Procedures:  3/20>>4/5ET tube 3/20 CVC > 3/24 11/23/19: R IJ tunneled perm cath:  Antimicrobials:  Remdesivir, cefepime, flagyl, vancomycin  Subjective: Appears more comfortable from yesterday morning, stable from yesterday afternoon. Dilaudid infusion has improved symptoms.  Objective: BP (!) 145/52 (BP Location: Left Arm)   Pulse (!) 114   Temp 99.2 F (37.3 C) (Axillary)   Resp 14   Ht 5\' 4"  (1.626 m)   Wt 73.9 kg   SpO2 97%   BMI 27.97 kg/m    Gen: Ill-appearing female in no distress Pulm: Nonlabored, regular rate, coarse. CV: Regular tachycardia. No murmur, rub, or gallop. No JVD, no dependent edema. GI: Abdomen soft, non-tender, non-distended, with normoactive bowel sounds.  Skin: No new rashes  Neuro: Minimally responsive. Psych: UTD   Scheduled Meds: . Chlorhexidine Gluconate Cloth  6 each Topical Q0600  . glycopyrrolate  0.4 mg Intravenous QID  . ketorolac  15 mg Intravenous Q6H  . scopolamine  1 patch Transdermal Q72H  . white petrolatum   Topical BID   Continuous Infusions: . HYDROmorphone 0.25 mg/hr (01/26/20 1859)  LOS: 28 days   Time spent: 25 minutes.  Patrecia Pour, MD [facility] [email or web address] Feb 17, 2020, 12:07 PM

## 2020-02-10 NOTE — Progress Notes (Signed)
Daily Progress Note   Patient Name: Patricia [name]       Date: 2020-02-02 DOB: 1956/05/26  Age: 64 y.o. MRN#: [id number] Attending Physician: Patrecia Pour, MD Primary Care Physician: Ladell Pier, MD Admit Date: 01/02/2020  Reason for Consultation/Follow-up: Non pain symptom management, Pain control, Psychosocial/spiritual support and Terminal Care     Subjective: Patient non-verbal.  Removed oxygen and BP cuff.  Requested RNs not check CBGs.  Family has left gospel music playing at bedside.  Talked with Patricia [name] on the telephone to let her know her mother is about the same as she was last evening and that I have removed her oxygen with no apparent change in status.   BP cuff removed.  Family reports they will be up to sit with her soon.   Assessment: Actively dying.  She has retraction in her chest with each breath and she is tachycardic.     Patient Profile/HPI:  Patient with a past medical history significant for end-stage renal disease/on hemodialysis, chronic systolic heart failure, hypertension, diabetes mellitus who was admitted on 01/05/2020 with an out of facility cardiac arrest. Family initiated CPR, EMS performed 15 minutes of CPR with return of ROSC. Patient was intubated in the emergency room, she under went hypothermic protocol but unfortunately on 01-07-27-21 developed hypotension and had a PEA arrest.  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 Decision was made with family for a one-way extubation on 01-15-20.  Length of Stay: 28   Vital Signs: BP (!) 145/52 (BP Location: Left Arm)   Pulse (!) 114   Temp 99.2 F (37.3 C) (Axillary)   Resp 14   Ht 5\' 4"  (1.626 m)   Wt 73.9 kg   SpO2 97%   BMI 27.97 kg/m  SpO2: SpO2: 97  % O2 Device: O2 Device: Nasal Cannula O2 Flow Rate: O2 Flow Rate (L/min): 2.5 L/min       Palliative Assessment/Data: 10%     Palliative Care Plan    Recommendations/Plan:  Do not escalate care.  If she becomes short of breath or in any distress give opioid boluses or antianxiety PRN (Lorazepam) for comfort.  Oxygen removed in order not to prolong her dying process.  I sat with her for 15 min after removing oxygen to ensure comfort.  Family  has been thru an emotionally draining ordeal.  Provide support.   Will ask Chaplain to visit for family support.  Code Status:  DNR  Prognosis:   Hours - Days   Discharge Planning:  Anticipated Hospital Death  Care plan was discussed with family.  Thank you for allowing the Palliative Medicine Team to assist in the care of this patient.  Total time spent:  35 min.     Greater than 50%  of this time was spent counseling and coordinating care related to the above assessment and plan.  Florentina Jenny, PA-C Palliative Medicine  Please contact Palliative MedicineTeam phone at [phone] for questions and concerns between 7 am - 7 pm.   Please see AMION for individual provider pager numbers.

## 2020-02-10 DEATH — deceased

## 2020-03-04 ENCOUNTER — Telehealth: Payer: Self-pay | Admitting: Internal Medicine

## 2020-03-04 NOTE — Telephone Encounter (Signed)
Phone call returned to patient's daughter Ms. Hill.  She states that she is trying to get the patient's death certificate amended to state the primary cause of death is cardiac arrest secondary to Covid.  Death certificate was signed by Dr. Vance Gather the hospitalist covering her care.  She states that the cause of death was listed as cardiac arrest anoxic brain injury but does not mention Covid.  Advised Ms. Hill that she can call [facility] and ask for the hospitalist office and leave a message for Dr. Bonner Puna to see whether he would be willing to do an amendment for her since he signed the original certificate.

## 2020-03-04 NOTE — Telephone Encounter (Signed)
Will forward to Dr. Johnson  

## 2020-03-04 NOTE — Telephone Encounter (Signed)
Patient daughter called requesting to speak with patient PCP regarding her death certificate. Patient daughter states that there was some information that was not included in the death certificate. Please f/u

## 2021-04-17 IMAGING — CT CT HEAD W/O CM
4 series · 16 of 47 positions shown, 18 images · non-contrast
Comparison: 11/18/2019

CLINICAL DATA: Post CPR

EXAM:
CT HEAD WITHOUT CONTRAST
TECHNIQUE: Contiguous axial images were obtained from the base of the skull
through the vertex without intravenous contrast.

[Series 3: head without · axial · non-contrast · 0.45mm/px · z∈[-143,-23]mm · 7 of 32 slices shown, 9 images]
[im 4/32  brain]
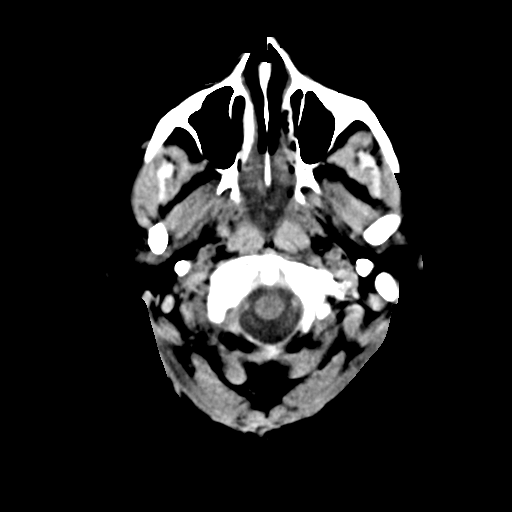
[im 4/32  bone]
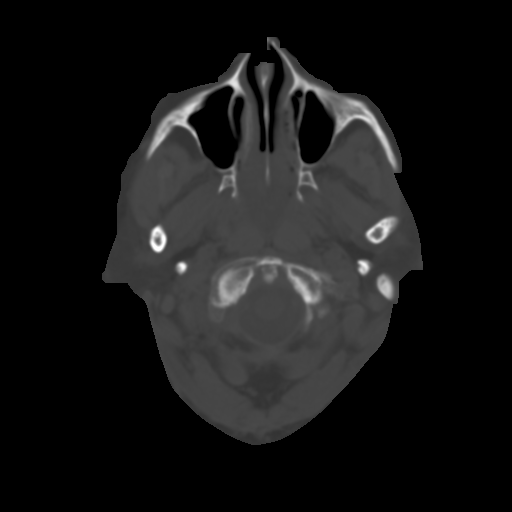
[im 8/32  brain]
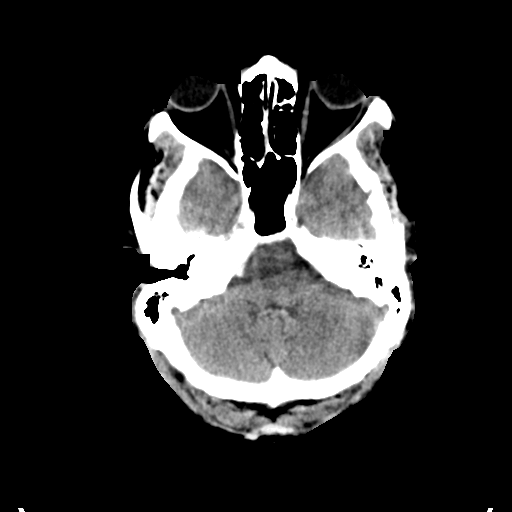
[im 12/32  brain]
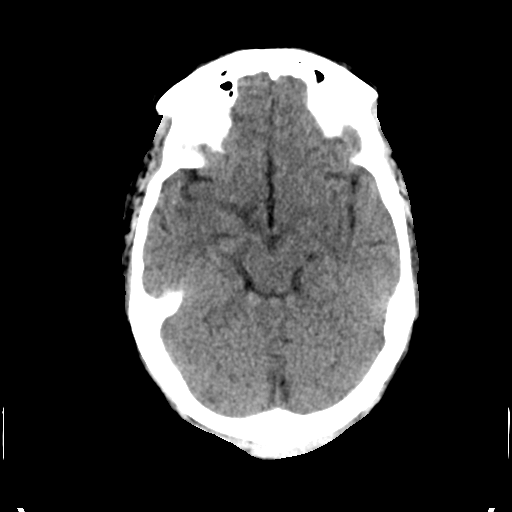
[im 16/32  brain]
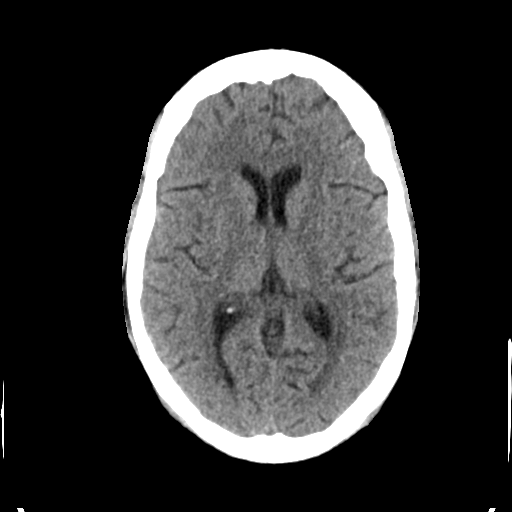
[im 20/32  brain]
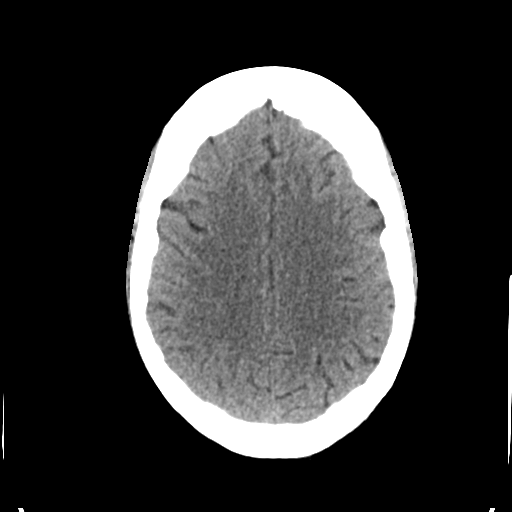
[im 20/32  bone]
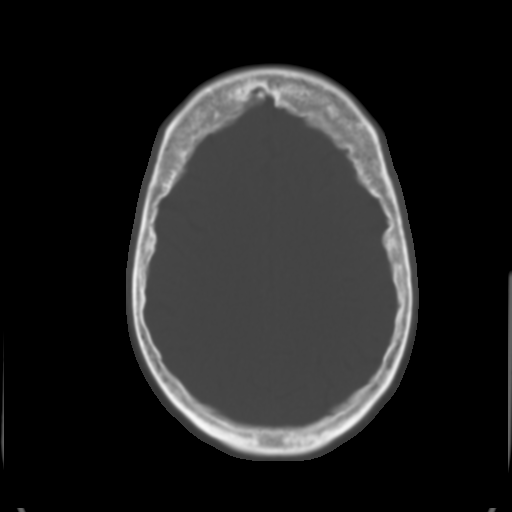
[im 24/32  brain]
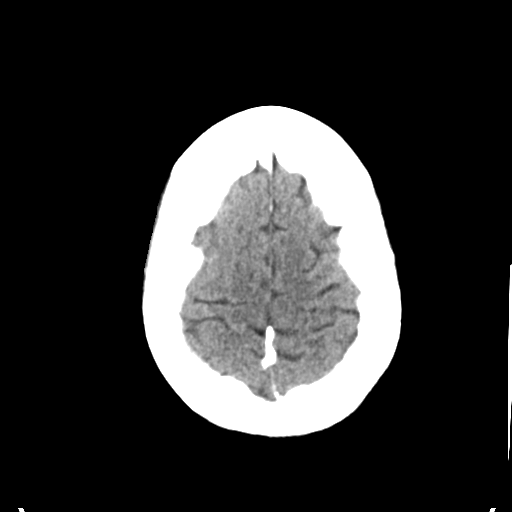
[im 28/32  brain]
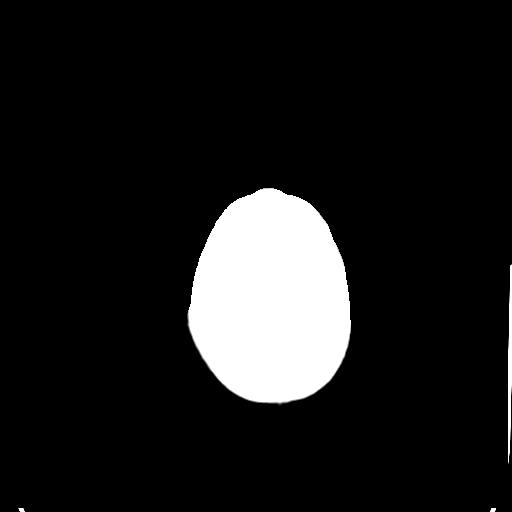

[Series 4: head bone · axial · 0.45mm/px · z∈[-144,-112]mm · 3 of 80 slices shown]
[im 8/80  bone]
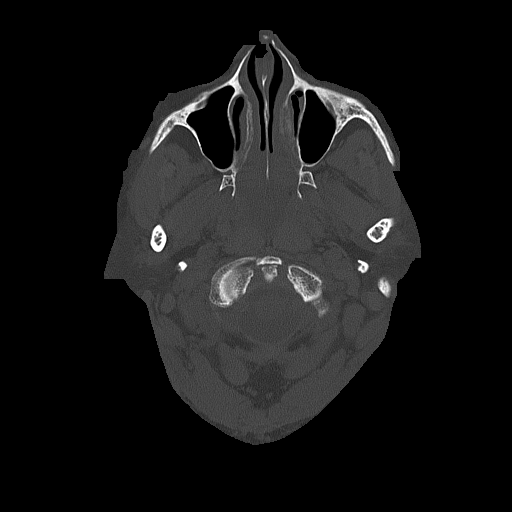
[im 16/80  bone]
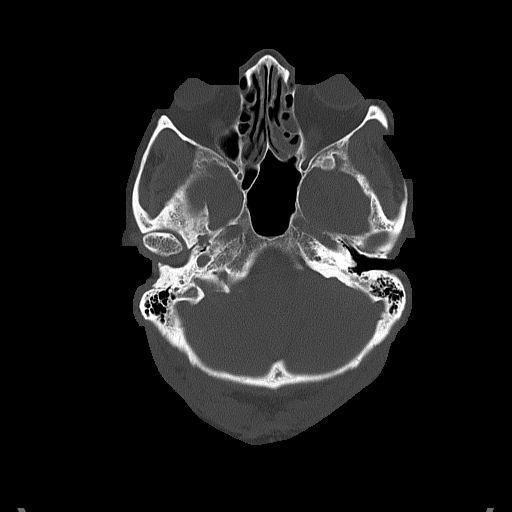
[im 24/80  bone]
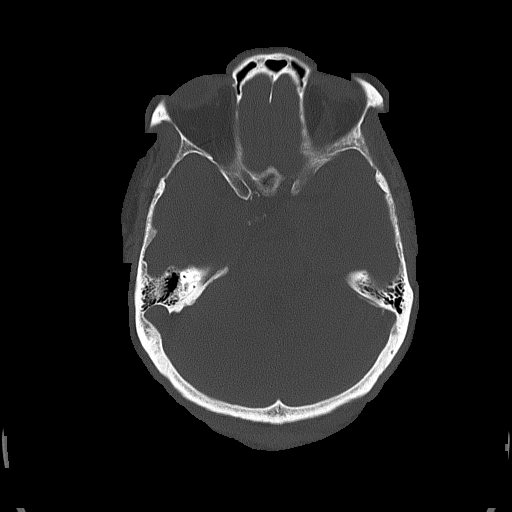

[Series 5: head without cor · coronal · non-contrast · 0.31mm/px · 3 of 68 slices shown]
[im 23/68  brain]
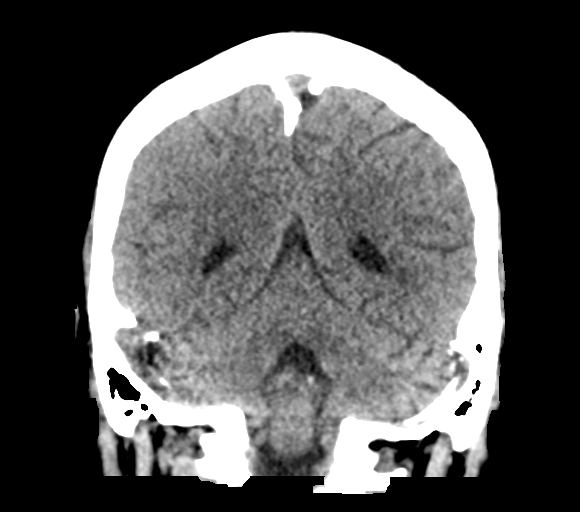
[im 30/68  brain]
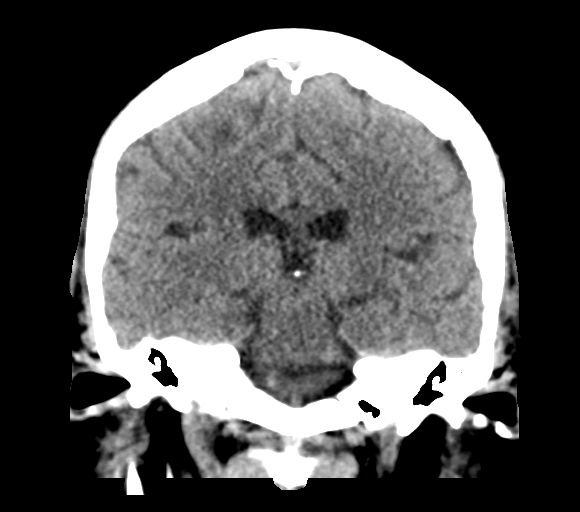
[im 38/68  brain]
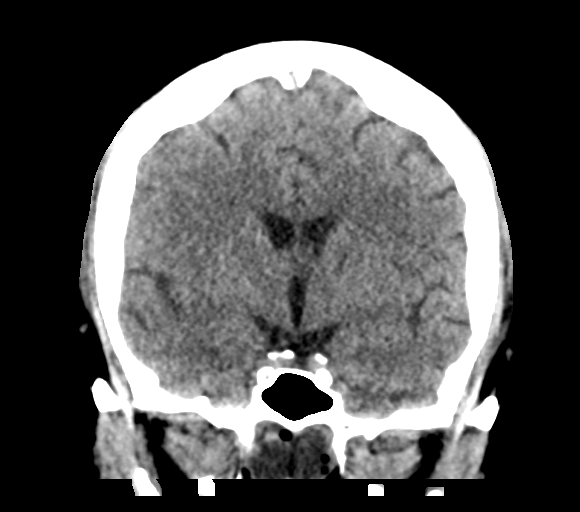

[Series 6: head without sag · sagittal · non-contrast · 0.32mm/px · 3 of 54 slices shown]
[im 18/54  brain]
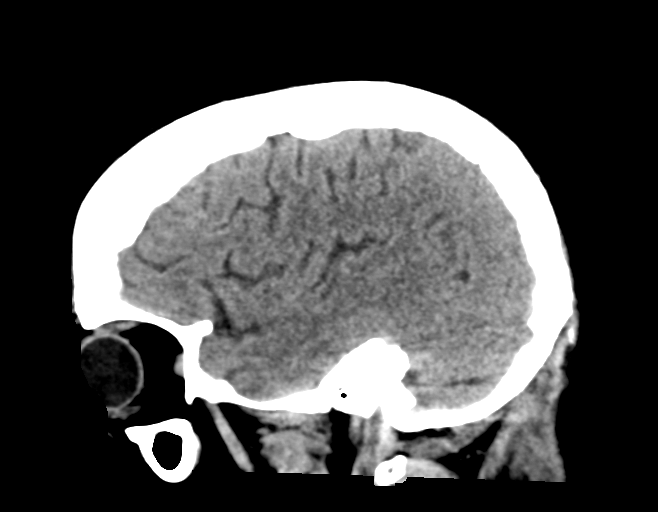
[im 27/54  brain]
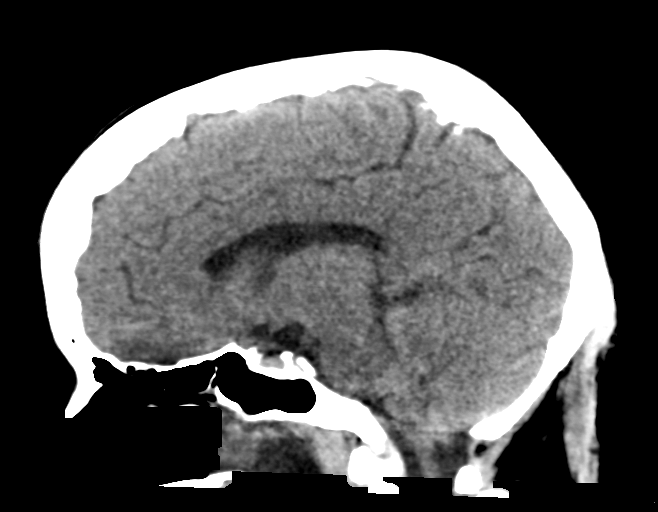
[im 36/54  brain]
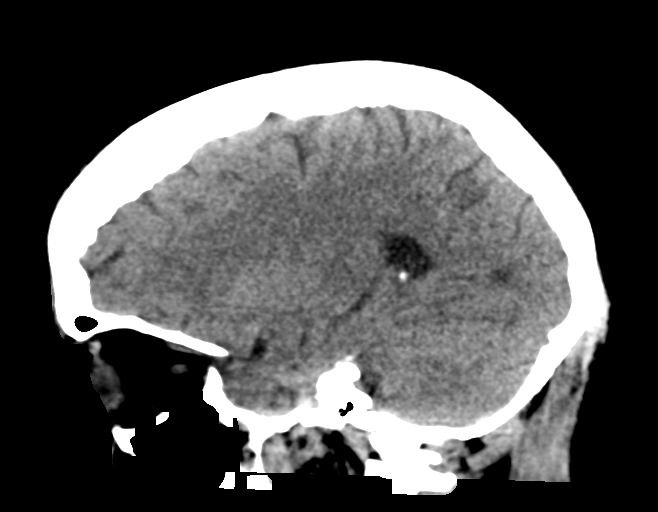

[16 of 47 positions shown; findings below may reference images not displayed]

FINDINGS: Brain: There is no acute intracranial hemorrhage, mass effect, or
edema. Gray-white differentiation is preserved. There is no
extra-axial fluid collection. Ventricles and sulci are within normal
limits in size and configuration.

Vascular: There is atherosclerotic calcification at the skull base.

Skull: Calvarium is unremarkable.

Sinuses/Orbits: Patchy mucosal thickening. No acute orbital finding.

Other: Mastoid air cells are clear. Retained secretions in the
posterior nasal cavity and included pharynx.
IMPRESSION: No acute intracranial hemorrhage or evidence of acute infarction.
